# Patient Record
Sex: Female | Born: 1990 | Race: Black or African American | Hispanic: No | Marital: Married | Smoking: Current every day smoker
Health system: Southern US, Community
[De-identification: ages and names within clinical notes are randomized; demographics above are authoritative.]

## PROBLEM LIST (undated history)

## (undated) DIAGNOSIS — K219 Gastro-esophageal reflux disease without esophagitis: Secondary | ICD-10-CM

## (undated) DIAGNOSIS — N186 End stage renal disease: Secondary | ICD-10-CM

## (undated) DIAGNOSIS — Q7649 Other congenital malformations of spine, not associated with scoliosis: Secondary | ICD-10-CM

## (undated) DIAGNOSIS — Z992 Dependence on renal dialysis: Secondary | ICD-10-CM

## (undated) DIAGNOSIS — N390 Urinary tract infection, site not specified: Secondary | ICD-10-CM

## (undated) DIAGNOSIS — I10 Essential (primary) hypertension: Secondary | ICD-10-CM

## (undated) DIAGNOSIS — Z5189 Encounter for other specified aftercare: Secondary | ICD-10-CM

## (undated) DIAGNOSIS — N189 Chronic kidney disease, unspecified: Secondary | ICD-10-CM

## (undated) DIAGNOSIS — IMO0001 Reserved for inherently not codable concepts without codable children: Secondary | ICD-10-CM

## (undated) DIAGNOSIS — D631 Anemia in chronic kidney disease: Secondary | ICD-10-CM

## (undated) DIAGNOSIS — F329 Major depressive disorder, single episode, unspecified: Secondary | ICD-10-CM

## (undated) HISTORY — PX: AV FISTULA PLACEMENT: SHX1204

## (undated) HISTORY — DX: Gastro-esophageal reflux disease without esophagitis: K21.9

---
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
## 2012-01-17 ENCOUNTER — Inpatient Hospital Stay (HOSPITAL_COMMUNITY)
Admission: EM | Admit: 2012-01-17 | Discharge: 2012-01-20 | DRG: 690 | Disposition: A | Discharge status: Home / Self Care | Payer: Medicare Other | Attending: Internal Medicine | Admitting: Internal Medicine

## 2012-01-17 ENCOUNTER — Encounter (HOSPITAL_COMMUNITY): Payer: Self-pay | Admitting: *Deleted

## 2012-01-17 ENCOUNTER — Emergency Department (HOSPITAL_COMMUNITY): Payer: Medicare Other

## 2012-01-17 DIAGNOSIS — D631 Anemia in chronic kidney disease: Secondary | ICD-10-CM | POA: Diagnosis present

## 2012-01-17 DIAGNOSIS — J40 Bronchitis, not specified as acute or chronic: Secondary | ICD-10-CM

## 2012-01-17 DIAGNOSIS — Z887 Allergy status to serum and vaccine status: Secondary | ICD-10-CM

## 2012-01-17 DIAGNOSIS — N186 End stage renal disease: Secondary | ICD-10-CM | POA: Diagnosis present

## 2012-01-17 DIAGNOSIS — Z91040 Latex allergy status: Secondary | ICD-10-CM

## 2012-01-17 DIAGNOSIS — R197 Diarrhea, unspecified: Secondary | ICD-10-CM

## 2012-01-17 DIAGNOSIS — A498 Other bacterial infections of unspecified site: Secondary | ICD-10-CM | POA: Diagnosis present

## 2012-01-17 DIAGNOSIS — I120 Hypertensive chronic kidney disease with stage 5 chronic kidney disease or end stage renal disease: Secondary | ICD-10-CM | POA: Diagnosis present

## 2012-01-17 DIAGNOSIS — N12 Tubulo-interstitial nephritis, not specified as acute or chronic: Secondary | ICD-10-CM

## 2012-01-17 DIAGNOSIS — Z992 Dependence on renal dialysis: Secondary | ICD-10-CM

## 2012-01-17 DIAGNOSIS — Z881 Allergy status to other antibiotic agents status: Secondary | ICD-10-CM

## 2012-01-17 DIAGNOSIS — E871 Hypo-osmolality and hyponatremia: Secondary | ICD-10-CM

## 2012-01-17 DIAGNOSIS — I10 Essential (primary) hypertension: Secondary | ICD-10-CM | POA: Diagnosis present

## 2012-01-17 DIAGNOSIS — E872 Acidosis: Secondary | ICD-10-CM

## 2012-01-17 DIAGNOSIS — D638 Anemia in other chronic diseases classified elsewhere: Secondary | ICD-10-CM | POA: Diagnosis present

## 2012-01-17 DIAGNOSIS — E876 Hypokalemia: Secondary | ICD-10-CM | POA: Diagnosis present

## 2012-01-17 DIAGNOSIS — Q059 Spina bifida, unspecified: Secondary | ICD-10-CM

## 2012-01-17 DIAGNOSIS — K5900 Constipation, unspecified: Secondary | ICD-10-CM | POA: Diagnosis present

## 2012-01-17 DIAGNOSIS — R000 Tachycardia, unspecified: Secondary | ICD-10-CM | POA: Diagnosis present

## 2012-01-17 DIAGNOSIS — N039 Chronic nephritic syndrome with unspecified morphologic changes: Secondary | ICD-10-CM | POA: Diagnosis present

## 2012-01-17 DIAGNOSIS — D649 Anemia, unspecified: Secondary | ICD-10-CM

## 2012-01-17 DIAGNOSIS — Z7401 Bed confinement status: Secondary | ICD-10-CM

## 2012-01-17 DIAGNOSIS — N10 Acute tubulo-interstitial nephritis: Principal | ICD-10-CM | POA: Diagnosis present

## 2012-01-17 DIAGNOSIS — Z91010 Allergy to peanuts: Secondary | ICD-10-CM

## 2012-01-17 MED ORDER — MORPHINE SULFATE 4 MG/ML IJ SOLN
4.0000 mg | Freq: Once | INTRAMUSCULAR | Status: AC
  Administered 2012-01-18: 4 mg via INTRAVENOUS
  Filled 2012-01-17: qty 1

## 2012-01-17 MED ORDER — ONDANSETRON HCL 4 MG/2ML IJ SOLN
4.0000 mg | Freq: Once | INTRAMUSCULAR | Status: AC
  Administered 2012-01-18: 4 mg via INTRAVENOUS
  Filled 2012-01-17: qty 2

## 2012-01-17 NOTE — ED Notes (Signed)
Pt c/o abdominal pain x 1 day. Nausea and vomiting x a couple of hrs. Feels sob as well

## 2012-01-17 NOTE — ED Provider Notes (Signed)
History   This chart was scribed for Ezequiel Essex, MD by Malen Gauze. The patient was seen in room APA09/APA09 and the patient's care was started at 11:35PM.    CSN: [id number]  Arrival date & time 01/17/12  2314   First MD Initiated Contact with Patient 01/17/12 2328      Chief Complaint  Patient presents with  . Nausea  . Emesis  . Abdominal Pain  . Shortness of Breath  . Back Pain    (Consider location/radiation/quality/duration/timing/severity/associated sxs/prior treatment) HPI [name] is a 21 y.o. female who presents to the Emergency Department complaining of intermittent, moderate to severe abdominal pain with an onset tonight. Pt started having nausea, vomit x1 a couple of hours ago. Pt states that pain comes and goes every 10 min. Pt states that she has had similar s/s 2 years ago caused by constipation. Pt also had a fever of 102 today. Nml BM. Pt had dialysis tx today. No HA, fever, neck pain, sore throat, rash, back pain, CP, SOB, diarrhea, dysuria, or extremity pain, edema, weakness, numbness, or tingling. Allergic to cipro, tetanus toxoids, influenza vaccines, and latex. No other pertinent medical symptoms.  Past Medical History  Diagnosis Date  . ESRD (end stage renal disease) on dialysis   . Spina bifida   . UTI (lower urinary tract infection)   . HTN (hypertension) 05/02/2011  . Anemia associated with chronic renal failure   . 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  . Blood transfusion   . Dialysis care     Past Surgical History  Procedure Date  . Av fistula placement     left arm    History reviewed. No pertinent family history.  History  Substance Use Topics  . Smoking status: Never Smoker   . Smokeless tobacco: Not on file  . Alcohol Use: No    OB History    Grav Para Term Preterm Abortions TAB SAB Ect Mult Living                  Review of Systems 10 Systems reviewed and all are negative for acute change  except as noted in the HPI.   Allergies  Other; Peanut-containing drug products; Ciprofloxacin; Influenza vaccines; Tetanus toxoids; and Latex  Home Medications   Current Outpatient Rx  Name Route Sig Dispense Refill  . ACETAMINOPHEN 325 MG PO TABS Oral Take 650 mg by mouth daily as needed. For pain    . EPOETIN ALFA 24401 UNIT/ML IJ SOLN Subcutaneous Inject 1 mL (10,000 Units total) into the skin 3 (three) times a week. 1 mL 2  . IBUPROFEN 200 MG PO TABS Oral Take 400 mg by mouth daily as needed. For pain      BP 126/63  Pulse 108  Temp 99.2 F (37.3 C) (Oral)  Resp 20  SpO2 99%  Physical Exam  Nursing note and vitals reviewed. Constitutional: She is oriented to person, place, and time. She appears well-developed and well-nourished. No distress.  HENT:  Head: Normocephalic and atraumatic.  Right Ear: External ear normal.  Left Ear: External ear normal.  Eyes: EOM are normal.  Neck: Normal range of motion. No tracheal deviation present.  Cardiovascular: Normal rate, regular rhythm and normal heart sounds.   No murmur heard. Pulmonary/Chest: Effort normal and breath sounds normal. No respiratory distress.  Abdominal: Soft. There is tenderness (Epigastric tenderness). There is no rebound and no guarding.  Musculoskeletal: Normal range of motion. She  exhibits tenderness (Bilateral CVA tenderness). She exhibits no edema.       LUE fistula with bruit and thrill Chronic LE contractures  Neurological: She is alert and oriented to person, place, and time.  Skin: Skin is warm and dry.  Psychiatric: She has a normal mood and affect. Her behavior is normal.    ED Course  Procedures (including critical care time)  DIAGNOSTIC STUDIES: Oxygen Saturation is 97% on room air, normal by my interpretation.    COORDINATION OF CARE:  11:39PM - EDMD will order Zofran, morphine, abd/CXR, UA, and blood w/u for the pt.   Labs Reviewed  URINALYSIS, ROUTINE W REFLEX MICROSCOPIC -  Abnormal; Notable for the following:    APPearance CLOUDY (*)     pH 8.5 (*)     Hgb urine dipstick SMALL (*)     Protein, ur 100 (*)     Leukocytes, UA MODERATE (*)     All other components within normal limits  CBC - Abnormal; Notable for the following:    Hemoglobin 11.9 (*)     All other components within normal limits  COMPREHENSIVE METABOLIC PANEL - Abnormal; Notable for the following:    Potassium 3.1 (*)     Creatinine, Ser 2.79 (*)     Total Bilirubin 0.1 (*)     GFR calc non Af Amer 24 (*)     GFR calc Af Amer 27 (*)     All other components within normal limits  LIPASE, BLOOD - Abnormal; Notable for the following:    Lipase 64 (*)     All other components within normal limits  URINE MICROSCOPIC-ADD ON - Abnormal; Notable for the following:    Bacteria, UA MANY (*)     All other components within normal limits  DIFFERENTIAL  POCT PREGNANCY, URINE  URINE CULTURE   Ct Abdomen Pelvis W Contrast  01/18/2012  *RADIOLOGY REPORT*  Clinical Data: Nausea, emesis, abdominal pain and shortness of breath.  Back pain.  History of spina bifida and chronic renal failure; on dialysis.  CT ABDOMEN AND PELVIS WITH CONTRAST  Technique:  Multidetector CT imaging of the abdomen and pelvis was performed following the standard protocol during bolus administration of intravenous contrast.  Contrast: 34mL OMNIPAQUE IOHEXOL 300 MG/ML  SOLN  Comparison: Renal ultrasound performed 04/01/2005, and abdominal radiograph performed earlier today at 02:01 a.m.  Findings: The visualized lung bases are clear.  Scattered small hypodensities within the liver, measuring up to 1.0 cm in size, are nonspecific but may reflect small cysts.  The spleen is unremarkable in appearance.  The pancreas is grossly unremarkable.  The gallbladder is within normal limits.  The adrenal glands are not well assessed; no suspicious adrenal gland masses are seen.  Borderline prominent periaortic nodes are seen at the level of the  diaphragm; these may reflect the patient's baseline.  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  Associated marked dilatation of the ureters is noted; findings are compatible with severe chronic hydronephrosis.  No free fluid is identified.  The small bowel is largely filled with contrast and is grossly unremarkable in appearance, though difficult to fully characterize due to surrounding structures.  The stomach is within normal limits.  No acute vascular abnormalities are seen.  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  There is marked dilatation of the rectum to 7.1 cm in diameter, filled with very dense stool, and demonstrating surrounding  wall thickening, concerning for impaction.  Stool is noted partially filling the remainder of the colon; minimal contrast is noted extending into the ascending colon.  The appendix is difficult to fully characterize; there is no evidence for appendicitis.  The bladder is markedly thick-walled, with multiple scattered diverticula and open vesicoureteral junctions, reflecting chronic reflux up both markedly enlarged ureters.  The uterus is not well assessed.  No suspicious adnexal masses are characterized; the ovaries appear grossly unremarkable.  No inguinal lymphadenopathy is seen.  No acute osseous abnormalities are identified.  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  IMPRESSION:  1.  Marked dilatation of the rectum to 7.1 cm in diameter, filled with very dense stool, and demonstrating surrounding wall thickening, concerning for impaction.  Stool partially fills the remainder of the colon. 2.  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  Associated marked dilatation of both ureters, with open reflux into markedly enlarged ureters bilaterally.  Significant wall  thickening along the bladder reflects chronic inflammation, with scattered bladder diverticula. Findings compatible with chronic severe hydronephrosis. 3.  Scattered small hypodensities within the liver are nonspecific but may reflect small cysts. 4.  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  Original Report Authenticated By: Santa Lighter, M.D.   Dg Abd Acute W/chest  01/18/2012  *RADIOLOGY REPORT*  Clinical Data: Abdominal pain, nausea and vomiting.  Shortness of breath.  Patient on dialysis.  ACUTE ABDOMEN SERIES (ABDOMEN 2 VIEW & CHEST 1 VIEW)  Comparison: Chest radiograph performed 05/02/2011, and abdominal radiograph performed 01/10/2011  Findings: The lungs are well-aerated and clear.  There is no evidence of focal opacification, pleural effusion or pneumothorax. The cardiomediastinal silhouette is within normal limits.  The visualized bowel gas pattern is unremarkable.  Scattered stool and air are seen within the colon; there is no evidence of small bowel dilatation to suggest obstruction.  No free intra-abdominal air is identified on the provided upright view.  Chronic osseous abnormalities are stable in appearance.  IMPRESSION:  1.  Unremarkable bowel gas pattern; no free intra-abdominal air seen. 2.  No acute cardiopulmonary process identified.  Original Report Authenticated By: Santa Lighter, M.D.     No diagnosis found.    MDM  Abdominal pain with nausea and vomiting for the past day.  Fever 102 at home.  Hx dialysis, self catheterization No bowel obstruction on AAS. Infected UA. Mild elevation of lipase.  Dialysis patient with fever and pyelonephritis. Questionable rectal impaction on CT.  Patient refuses rectal exam for further evaluation.  Did have BM in ED. Persistent tachycardia with fever and urinary symptoms. Will admit for IV antibiotics and monitoring.  I  personally performed the services described in this documentation, which was scribed in my presence.  The recorded information has been reviewed and considered.         Ezequiel Essex, MD 01/18/12 [phone]

## 2012-01-18 ENCOUNTER — Encounter (HOSPITAL_COMMUNITY): Payer: Self-pay | Admitting: *Deleted

## 2012-01-18 ENCOUNTER — Emergency Department (HOSPITAL_COMMUNITY): Payer: Medicare Other

## 2012-01-18 DIAGNOSIS — N186 End stage renal disease: Secondary | ICD-10-CM

## 2012-01-18 DIAGNOSIS — N039 Chronic nephritic syndrome with unspecified morphologic changes: Secondary | ICD-10-CM

## 2012-01-18 DIAGNOSIS — Q059 Spina bifida, unspecified: Secondary | ICD-10-CM

## 2012-01-18 DIAGNOSIS — I10 Essential (primary) hypertension: Secondary | ICD-10-CM

## 2012-01-18 DIAGNOSIS — R5381 Other malaise: Secondary | ICD-10-CM

## 2012-01-18 DIAGNOSIS — D631 Anemia in chronic kidney disease: Secondary | ICD-10-CM

## 2012-01-18 LAB — URINALYSIS, ROUTINE W REFLEX MICROSCOPIC
Bilirubin Urine: NEGATIVE
Glucose, UA: NEGATIVE mg/dL
Ketones, ur: NEGATIVE mg/dL
pH: 8.5 — ABNORMAL HIGH (ref 5.0–8.0)

## 2012-01-18 LAB — COMPREHENSIVE METABOLIC PANEL
Albumin: 3.6 g/dL (ref 3.5–5.2)
BUN: 20 mg/dL (ref 6–23)
Chloride: 99 mEq/L (ref 96–112)
Creatinine, Ser: 2.79 mg/dL — ABNORMAL HIGH (ref 0.50–1.10)
GFR calc Af Amer: 27 mL/min — ABNORMAL LOW (ref >90)
Glucose, Bld: 94 mg/dL (ref 70–99)
Total Bilirubin: 0.1 mg/dL — ABNORMAL LOW (ref 0.3–1.2)

## 2012-01-18 LAB — DIFFERENTIAL
Basophils Relative: 0 % (ref 0–1)
Eosinophils Absolute: 0.2 10*3/uL (ref 0.0–0.7)
Monocytes Absolute: 0.5 10*3/uL (ref 0.1–1.0)
Monocytes Relative: 7 % (ref 3–12)
Neutro Abs: 4.8 10*3/uL (ref 1.7–7.7)

## 2012-01-18 LAB — POCT PREGNANCY, URINE: Preg Test, Ur: NEGATIVE

## 2012-01-18 LAB — CBC
HCT: 36.2 % (ref 36.0–46.0)
Hemoglobin: 11.9 g/dL — ABNORMAL LOW (ref 12.0–15.0)
MCH: 30.3 pg (ref 26.0–34.0)
MCHC: 32.9 g/dL (ref 30.0–36.0)

## 2012-01-18 LAB — LIPASE, BLOOD: Lipase: 64 U/L — ABNORMAL HIGH (ref 11–59)

## 2012-01-18 LAB — URINE MICROSCOPIC-ADD ON

## 2012-01-18 MED ORDER — SODIUM CHLORIDE 0.9 % IV SOLN
250.0000 mL | INTRAVENOUS | Status: DC | PRN
  Administered 2012-01-18: 250 mL via INTRAVENOUS

## 2012-01-18 MED ORDER — BENZOCAINE 10 % MT GEL
Freq: Four times a day (QID) | OROMUCOSAL | Status: DC | PRN
  Filled 2012-01-18: qty 9.4

## 2012-01-18 MED ORDER — HYDROMORPHONE HCL PF 1 MG/ML IJ SOLN
0.5000 mg | INTRAMUSCULAR | Status: DC | PRN
  Administered 2012-01-18 (×2): 0.5 mg via INTRAVENOUS
  Filled 2012-01-18 (×2): qty 1

## 2012-01-18 MED ORDER — IOHEXOL 300 MG/ML  SOLN: 20.0000 mL | Freq: Once | INTRAMUSCULAR | Status: DC | PRN

## 2012-01-18 MED ORDER — HEPARIN SODIUM (PORCINE) 5000 UNIT/ML IJ SOLN
5000.0000 [IU] | Freq: Three times a day (TID) | INTRAMUSCULAR | Status: DC
  Administered 2012-01-18: 5000 [IU] via SUBCUTANEOUS
  Filled 2012-01-18 (×2): qty 1

## 2012-01-18 MED ORDER — SODIUM CHLORIDE 0.9 % IJ SOLN: 3.0000 mL | INTRAMUSCULAR | Status: DC | PRN

## 2012-01-18 MED ORDER — SODIUM CHLORIDE 0.9 % IV SOLN: INTRAVENOUS | Status: DC

## 2012-01-18 MED ORDER — DEXTROSE 5 % IV SOLN
1.0000 g | INTRAVENOUS | Status: DC
  Administered 2012-01-19: 1 g via INTRAVENOUS
  Filled 2012-01-18: qty 10

## 2012-01-18 MED ORDER — SODIUM CHLORIDE 0.9 % IJ SOLN
3.0000 mL | Freq: Two times a day (BID) | INTRAMUSCULAR | Status: DC
  Administered 2012-01-20: 3 mL via INTRAVENOUS
  Filled 2012-01-18: qty 3

## 2012-01-18 MED ORDER — ONDANSETRON HCL 4 MG/2ML IJ SOLN
4.0000 mg | Freq: Four times a day (QID) | INTRAMUSCULAR | Status: DC | PRN
  Administered 2012-01-18 (×2): 4 mg via INTRAVENOUS
  Filled 2012-01-18 (×2): qty 2

## 2012-01-18 MED ORDER — POLYETHYLENE GLYCOL 3350 17 G PO PACK
17.0000 g | PACK | Freq: Every day | ORAL | Status: DC
  Administered 2012-01-19: 17 g via ORAL
  Filled 2012-01-18: qty 1

## 2012-01-18 MED ORDER — DOCUSATE SODIUM 100 MG PO CAPS
100.0000 mg | ORAL_CAPSULE | Freq: Two times a day (BID) | ORAL | Status: DC
  Administered 2012-01-18: 100 mg via ORAL
  Filled 2012-01-18: qty 1

## 2012-01-18 MED ORDER — BISACODYL 10 MG RE SUPP: 10.0000 mg | Freq: Every day | RECTAL | Status: DC | PRN

## 2012-01-18 MED ORDER — POLYETHYLENE GLYCOL 3350 17 G PO PACK: 17.0000 g | PACK | Freq: Every day | ORAL | Status: DC

## 2012-01-18 MED ORDER — ONDANSETRON HCL 4 MG PO TABS: 4.0000 mg | ORAL_TABLET | Freq: Four times a day (QID) | ORAL | Status: DC | PRN

## 2012-01-18 MED ORDER — IBUPROFEN 400 MG PO TABS
400.0000 mg | ORAL_TABLET | Freq: Once | ORAL | Status: AC
Start: 2012-01-18 — End: 2012-01-18
  Administered 2012-01-18: 400 mg via ORAL
  Filled 2012-01-18: qty 1

## 2012-01-18 MED ORDER — SODIUM CHLORIDE 0.9 % IV BOLUS (SEPSIS)
500.0000 mL | Freq: Once | INTRAVENOUS | Status: AC
  Administered 2012-01-18: 500 mL via INTRAVENOUS

## 2012-01-18 MED ORDER — IOHEXOL 300 MG/ML  SOLN
35.0000 mL | Freq: Once | INTRAMUSCULAR | Status: AC | PRN
  Administered 2012-01-18: 35 mL via INTRAVENOUS

## 2012-01-18 MED ORDER — ONDANSETRON HCL 4 MG/2ML IJ SOLN
4.0000 mg | Freq: Three times a day (TID) | INTRAMUSCULAR | Status: DC | PRN
  Administered 2012-01-18: 4 mg via INTRAVENOUS
  Filled 2012-01-18: qty 2

## 2012-01-18 MED ORDER — DEXTROSE 5 % IV SOLN
1.0000 g | Freq: Once | INTRAVENOUS | Status: AC
  Administered 2012-01-18: 1 g via INTRAVENOUS
  Filled 2012-01-18: qty 10

## 2012-01-18 MED ORDER — HYDROMORPHONE HCL PF 1 MG/ML IJ SOLN
0.5000 mg | INTRAMUSCULAR | Status: DC | PRN
  Administered 2012-01-18: 0.5 mg via INTRAVENOUS
  Filled 2012-01-18: qty 1

## 2012-01-18 MED ORDER — POTASSIUM CHLORIDE CRYS ER 20 MEQ PO TBCR
40.0000 meq | EXTENDED_RELEASE_TABLET | Freq: Once | ORAL | Status: AC
  Administered 2012-01-18: 40 meq via ORAL
  Filled 2012-01-18: qty 2

## 2012-01-18 MED ORDER — FLEET ENEMA 7-19 GM/118ML RE ENEM
1.0000 | ENEMA | Freq: Once | RECTAL | Status: AC
  Administered 2012-01-18: 1 via RECTAL

## 2012-01-18 MED ORDER — SODIUM CHLORIDE 0.9 % IJ SOLN
3.0000 mL | Freq: Two times a day (BID) | INTRAMUSCULAR | Status: DC
  Administered 2012-01-20 (×2): 3 mL via INTRAVENOUS
  Filled 2012-01-18: qty 6

## 2012-01-18 NOTE — Progress Notes (Signed)
Pt has c/o gums hurting her this morning.  Paged Dr. Conley Canal to notify.

## 2012-01-18 NOTE — ED Notes (Addendum)
Fleets given to pt, pt has soft stool at rectum entrance; Pt has no control of muscle and was unable to hold enema in; app 1/4 to 1/2 of enema retained; Small amts of stool expelled; Pt cleaned and padded; small amts liquid stool still coming out at end of procedure.

## 2012-01-18 NOTE — Progress Notes (Signed)
Pt c/o nausea and vomited a large amount of emesis.

## 2012-01-18 NOTE — Consult Note (Signed)
Reason for Consult: End-stage renal disease Referring Physician: Dr. Rockie Neighbours is an 21 y.o. female.  HPI: She is a patient who has history of her hypertension, spina bifida and end-stage renal disease presently came with complaints of her difficulty in breathing. According to the patient she had her dialysis yesterday and when she went home she started having difficulty breathing. Presently she says she is feeling better and denies any orthopnea or paroxysmal nocturnal dyspnea. She denies also any nausea or vomiting her today.  Past Medical History  Diagnosis Date  . ESRD (end stage renal disease) on dialysis   . Spina bifida   . UTI (lower urinary tract infection)   . HTN (hypertension) 05/02/2011  . Anemia associated with chronic renal failure   . 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  . Blood transfusion   . Dialysis care     Past Surgical History  Procedure Date  . Av fistula placement     left arm    Family History  Problem Relation Age of Onset  . Kidney cancer Other     Social History:  reports that she has never smoked. She does not have any smokeless tobacco history on file. She reports that she does not drink alcohol or use illicit drugs.  Allergies:  Allergies  Allergen Reactions  . Other Anaphylaxis    Revaclear dialzer  . Peanut-Containing Drug Products Shortness Of Breath and Swelling    Tongue swelling  . Ciprofloxacin Nausea And Vomiting and Other (See Comments)    HIGH FEVER  . Influenza Vaccines Nausea And Vomiting  . Tetanus Toxoids Nausea And Vomiting and Other (See Comments)    HIGH FEVER  . Latex Itching and Rash    Medications: I have reviewed the patient's current medications.  Results for orders placed during the hospital encounter of 01/17/12 (from the past 48 hour(s))  CBC     Status: Abnormal   Collection Time   01/18/12 12:03 AM      Component Value Range Comment   WBC 7.8  4.0 - 10.5 K/uL    RBC 3.93   3.87 - 5.11 MIL/uL    Hemoglobin 11.9 (*) 12.0 - 15.0 g/dL    HCT 36.2  36.0 - 46.0 %    MCV 92.1  78.0 - 100.0 fL    MCH 30.3  26.0 - 34.0 pg    MCHC 32.9  30.0 - 36.0 g/dL    RDW 12.3  11.5 - 15.5 %    Platelets 217  150 - 400 K/uL   DIFFERENTIAL     Status: Normal   Collection Time   01/18/12 12:03 AM      Component Value Range Comment   Neutrophils Relative 62  43 - 77 %    Neutro Abs 4.8  1.7 - 7.7 K/uL    Lymphocytes Relative 28  12 - 46 %    Lymphs Abs 2.2  0.7 - 4.0 K/uL    Monocytes Relative 7  3 - 12 %    Monocytes Absolute 0.5  0.1 - 1.0 K/uL    Eosinophils Relative 3  0 - 5 %    Eosinophils Absolute 0.2  0.0 - 0.7 K/uL    Basophils Relative 0  0 - 1 %    Basophils Absolute 0.0  0.0 - 0.1 K/uL   COMPREHENSIVE METABOLIC PANEL     Status: Abnormal   Collection Time   01/18/12 12:03  AM      Component Value Range Comment   Sodium 136  135 - 145 mEq/L    Potassium 3.1 (*) 3.5 - 5.1 mEq/L    Chloride 99  96 - 112 mEq/L    CO2 27  19 - 32 mEq/L    Glucose, Bld 94  70 - 99 mg/dL    BUN 20  6 - 23 mg/dL    Creatinine, Ser 2.79 (*) 0.50 - 1.10 mg/dL    Calcium 9.4  8.4 - 10.5 mg/dL    Total Protein 7.9  6.0 - 8.3 g/dL    Albumin 3.6  3.5 - 5.2 g/dL    AST 20  0 - 37 U/L    ALT 7  0 - 35 U/L    Alkaline Phosphatase 111  39 - 117 U/L    Total Bilirubin 0.1 (*) 0.3 - 1.2 mg/dL    GFR calc non Af Amer 24 (*) >90 mL/min    GFR calc Af Amer 27 (*) >90 mL/min   LIPASE, BLOOD     Status: Abnormal   Collection Time   01/18/12 12:03 AM      Component Value Range Comment   Lipase 64 (*) 11 - 59 U/L   URINALYSIS, ROUTINE W REFLEX MICROSCOPIC     Status: Abnormal   Collection Time   01/18/12 12:25 AM      Component Value Range Comment   Color, Urine YELLOW  YELLOW    APPearance CLOUDY (*) CLEAR    Specific Gravity, Urine 1.015  1.005 - 1.030    pH 8.5 (*) 5.0 - 8.0    Glucose, UA NEGATIVE  NEGATIVE mg/dL    Hgb urine dipstick SMALL (*) NEGATIVE    Bilirubin Urine NEGATIVE   NEGATIVE    Ketones, ur NEGATIVE  NEGATIVE mg/dL    Protein, ur 100 (*) NEGATIVE mg/dL    Urobilinogen, UA 0.2  0.0 - 1.0 mg/dL    Nitrite NEGATIVE  NEGATIVE    Leukocytes, UA MODERATE (*) NEGATIVE   URINE MICROSCOPIC-ADD ON     Status: Abnormal   Collection Time   01/18/12 12:25 AM      Component Value Range Comment   Squamous Epithelial / LPF RARE  RARE    WBC, UA TOO NUMEROUS TO COUNT  <3 WBC/hpf    RBC / HPF 3-6  <3 RBC/hpf    Bacteria, UA MANY (*) RARE   POCT PREGNANCY, URINE     Status: Normal   Collection Time   01/18/12 12:27 AM      Component Value Range Comment   Preg Test, Ur NEGATIVE  NEGATIVE     Ct Abdomen Pelvis W Contrast  01/18/2012  *RADIOLOGY REPORT*  Clinical Data: Nausea, emesis, abdominal pain and shortness of breath.  Back pain.  History of spina bifida and chronic renal failure; on dialysis.  CT ABDOMEN AND PELVIS WITH CONTRAST  Technique:  Multidetector CT imaging of the abdomen and pelvis was performed following the standard protocol during bolus administration of intravenous contrast.  Contrast: 32mL OMNIPAQUE IOHEXOL 300 MG/ML  SOLN  Comparison: Renal ultrasound performed 04/01/2005, and abdominal radiograph performed earlier today at 02:01 a.m.  Findings: The visualized lung bases are clear.  Scattered small hypodensities within the liver, measuring up to 1.0 cm in size, are nonspecific but may reflect small cysts.  The spleen is unremarkable in appearance.  The pancreas is grossly unremarkable.  The gallbladder is within normal limits.  The adrenal  glands are not well assessed; no suspicious adrenal gland masses are seen.  Borderline prominent periaortic nodes are seen at the level of the diaphragm; these may reflect the patient's baseline.  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  Associated marked dilatation of the ureters is noted; findings are compatible with severe chronic  hydronephrosis.  No free fluid is identified.  The small bowel is largely filled with contrast and is grossly unremarkable in appearance, though difficult to fully characterize due to surrounding structures.  The stomach is within normal limits.  No acute vascular abnormalities are seen.  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  There is marked dilatation of the rectum to 7.1 cm in diameter, filled with very dense stool, and demonstrating surrounding wall thickening, concerning for impaction.  Stool is noted partially filling the remainder of the colon; minimal contrast is noted extending into the ascending colon.  The appendix is difficult to fully characterize; there is no evidence for appendicitis.  The bladder is markedly thick-walled, with multiple scattered diverticula and open vesicoureteral junctions, reflecting chronic reflux up both markedly enlarged ureters.  The uterus is not well assessed.  No suspicious adnexal masses are characterized; the ovaries appear grossly unremarkable.  No inguinal lymphadenopathy is seen.  No acute osseous abnormalities are identified.  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  IMPRESSION:  1.  Marked dilatation of the rectum to 7.1 cm in diameter, filled with very dense stool, and demonstrating surrounding wall thickening, concerning for impaction.  Stool partially fills the remainder of the colon. 2.  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  Associated marked dilatation of both ureters, with open reflux into markedly enlarged ureters bilaterally.  Significant wall thickening along the bladder reflects chronic inflammation, with scattered bladder diverticula. Findings compatible with chronic severe hydronephrosis. 3.  Scattered small hypodensities within the liver are nonspecific but may reflect small cysts. 4.  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  Original Report Authenticated By: Santa Lighter, M.D.   Dg Abd Acute W/chest  01/18/2012  *RADIOLOGY REPORT*  Clinical Data: Abdominal pain, nausea and vomiting.  Shortness of breath.  Patient on dialysis.  ACUTE ABDOMEN SERIES (ABDOMEN 2 VIEW & CHEST 1 VIEW)  Comparison: Chest radiograph performed 05/02/2011, and abdominal radiograph performed 01/10/2011  Findings: The lungs are well-aerated and clear.  There is no evidence of focal opacification, pleural effusion or pneumothorax. The cardiomediastinal silhouette is within normal limits.  The visualized bowel gas pattern is unremarkable.  Scattered stool and air are seen within the colon; there is no evidence of small bowel dilatation to suggest obstruction.  No free intra-abdominal air is identified on the provided upright view.  Chronic osseous abnormalities are stable in appearance.  IMPRESSION:  1.  Unremarkable bowel gas pattern; no free intra-abdominal air seen. 2.  No acute cardiopulmonary process identified.  Original Report Authenticated By: Santa Lighter, M.D.    Review of Systems  Constitutional: Negative for fever.  HENT: Negative for congestion.   Respiratory: Positive for shortness of breath.   Gastrointestinal: Positive for nausea. Negative for vomiting and abdominal pain.  Neurological: Negative for weakness.   Blood pressure 112/76, pulse 79, temperature 98 F (36.7 C), temperature source Oral, resp. rate 20, weight 30.1 kg (66  lb 5.7 oz), SpO2 100.00%. Physical Exam  Eyes: No scleral icterus.  Neck: No JVD present.  Cardiovascular: Normal rate and regular rhythm.   No murmur heard. Respiratory: No respiratory distress. She has no wheezes.  GI: She exhibits no distension. There is no tenderness.  Musculoskeletal: She exhibits no edema.  Neurological: She is alert.     Assessment/Plan: Problem #1 end-stage renal disease she status post hemodialysis yesterday her BUN and creatinine stable condition doesn't have any uremic sign and symptoms. Problem #2 hypokalemia Problem #3 history of hypertension Problem #4 history of anemia most likely secondary to chronic renal failure Problem #5 history of spina bifida Problem #6 history of difficulty breathing presently patient is a symptomatic. Problem #7 history of tachycardia. Plan: We'll give her KCl 40 mEq by mouth one dose We'll follow her basic metabolic panel and phosphorus in the morning We'll make a decision about her dialysis tomorrow. If patient is stable her regular dialysis schedule will be Tuesday Thursday and Saturday. Hence her next regular scheduled on Tuesday.  Brantly Kalman S 01/18/2012, 8:33 AM

## 2012-01-18 NOTE — H&P (Signed)
Chief Complaint:  Fever  HPI: 21 year old female with a history of spina bifida and has frequent bladder infections as she has to self cath herself every 4 hours at home comes in with less than 24 hours of fever and and right-sided flank pain. She denies any nausea vomiting. She denies any diarrhea. She is on dialysis Tuesdays Thursdays and Saturdays. She is bed bound due to her spina bifida.  Review of Systems:  Otherwise negative  Past Medical History: Past Medical History  Diagnosis Date  . ESRD (end stage renal disease) on dialysis   . Spina bifida   . UTI (lower urinary tract infection)   . HTN (hypertension) 05/02/2011  . Anemia associated with chronic renal failure   . 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  . Blood transfusion   . Dialysis care    Past Surgical History  Procedure Date  . Av fistula placement     left arm    Medications: Prior to Admission medications   Medication Sig Start Date End Date Taking? Authorizing Provider  acetaminophen (TYLENOL) 325 MG tablet Take 650 mg by mouth daily as needed. For pain   Yes Historical Provider, MD  epoetin alfa (EPOGEN,PROCRIT) 60454 UNIT/ML injection Inject 1 mL (10,000 Units total) into the skin 3 (three) times a week. 05/08/11  Yes Ardyth Gal, MD  ibuprofen (ADVIL,MOTRIN) 200 MG tablet Take 400 mg by mouth daily as needed. For pain   Yes Historical Provider, MD    Allergies:   Allergies  Allergen Reactions  . Other Anaphylaxis    Revaclear dialzer  . Peanut-Containing Drug Products Shortness Of Breath and Swelling    Tongue swelling  . Ciprofloxacin Nausea And Vomiting and Other (See Comments)    HIGH FEVER  . Influenza Vaccines Nausea And Vomiting  . Tetanus Toxoids Nausea And Vomiting and Other (See Comments)    HIGH FEVER  . Latex Itching and Rash    Social History:  reports that she has never smoked. She does not have any smokeless tobacco history on file. She reports that she  does not drink alcohol or use illicit drugs.  Family History: History reviewed. No pertinent family history.  Physical Exam: Filed Vitals:   01/17/12 2316 01/18/12 0258 01/18/12 0453  BP: 123/64 126/63 131/72  Pulse: 111 108 97  Temp: 99.1 F (37.3 C) 99.2 F (37.3 C) 99.2 F (37.3 C)  TempSrc: Oral Oral   Resp: 24 20   SpO2: 97% 99% 99%   General appearance: alert, cooperative and no distress Lungs: clear to auscultation bilaterally Heart: regular rate and rhythm, S1, S2 normal, no murmur, click, rub or gallop Abdomen: soft, non-tender; bowel sounds normal; no masses,  no organomegaly Extremities: contractures with major deformities of ble Pulses: 2+ and symmetric Skin: Skin color, texture, turgor normal. No rashes or lesions Neurologic: Grossly normal cranial nerves.  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  Back pain.  History of spina bifida and chronic renal failure; on dialysis.  CT ABDOMEN AND PELVIS WITH CONTRAST  Technique:  Multidetector CT imaging of the abdomen and pelvis was performed following the standard protocol during bolus administration of intravenous contrast.  Contrast: 44mL OMNIPAQUE IOHEXOL 300 MG/ML  SOLN  Comparison: Renal ultrasound performed 04/01/2005, and abdominal radiograph performed earlier today at 02:01 a.m.  Findings: The visualized lung bases are clear.   Scattered small hypodensities within the liver, measuring up to 1.0 cm in size, are nonspecific but may reflect small cysts.  The spleen is unremarkable in appearance.  The pancreas is grossly unremarkable.  The gallbladder is within normal limits.  The adrenal glands are not well assessed; no suspicious adrenal gland masses are seen.  Borderline prominent periaortic nodes are seen at the level of the diaphragm; these may reflect the patient's baseline.  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  Associated marked dilatation of the ureters is noted; findings are compatible with severe chronic hydronephrosis.  No free fluid is identified.  The small bowel is largely filled with contrast and is grossly unremarkable in appearance, though difficult to fully characterize due to surrounding structures.  The stomach is within normal limits.  No acute vascular abnormalities are seen.  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  There is marked dilatation of the rectum to 7.1 cm in diameter, filled with very dense stool, and demonstrating surrounding wall thickening, concerning for impaction.  Stool is noted partially filling the remainder of the colon; minimal contrast is noted extending into the ascending colon.  The appendix is difficult to fully characterize; there is no evidence for appendicitis.  The bladder is markedly thick-walled, with multiple scattered diverticula and open vesicoureteral junctions, reflecting chronic reflux up both markedly enlarged ureters.  The uterus is not well assessed.  No suspicious adnexal masses are characterized; the ovaries appear grossly unremarkable.  No inguinal lymphadenopathy is seen.  No acute osseous abnormalities are identified.  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  IMPRESSION:  1.  Marked dilatation of the rectum to 7.1 cm in  diameter, filled with very dense stool, and demonstrating surrounding wall thickening, concerning for impaction.  Stool partially fills the remainder of the colon. 2.  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  Associated marked dilatation of both ureters, with open reflux into markedly enlarged ureters bilaterally.  Significant wall thickening along the bladder reflects chronic inflammation, with scattered bladder diverticula. Findings compatible with chronic severe hydronephrosis. 3.  Scattered small hypodensities within the liver are nonspecific but may reflect small cysts. 4.  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  Original Report Authenticated By: Santa Lighter, M.D.   Dg Abd Acute W/chest  01/18/2012  *RADIOLOGY REPORT*  Clinical Data: Abdominal pain, nausea and vomiting.  Shortness of breath.  Patient on dialysis.  ACUTE ABDOMEN SERIES (ABDOMEN 2 VIEW & CHEST 1 VIEW)  Comparison: Chest radiograph performed 05/02/2011, and abdominal radiograph performed 01/10/2011  Findings: The lungs are well-aerated and clear.  There is no evidence  of focal opacification, pleural effusion or pneumothorax. The cardiomediastinal silhouette is within normal limits.  The visualized bowel gas pattern is unremarkable.  Scattered stool and air are seen within the colon; there is no evidence of small bowel dilatation to suggest obstruction.  No free intra-abdominal air is identified on the provided upright view.  Chronic osseous abnormalities are stable in appearance.  IMPRESSION:  1.  Unremarkable bowel gas pattern; no free intra-abdominal air seen. 2.  No acute cardiopulmonary process identified.  Original Report Authenticated By: Santa Lighter, M.D.    Assessment/Plan Present on Admission:  21 year old with acute pyelonephritis,  end-stage renal disease dialysis dependent, spina bifida  .ESRD (end stage renal disease) on dialysis .Pyelonephritis, acute .Anemia .Tachycardia .HTN (hypertension)  Urine culture has been sent place patient on Rocephin. She's received some IV fluids in the emergency department will hold off on this any further as she is a dialysis patient. Obtain nephrology consultation.  Coron Rossano A [id number] 01/18/2012, 5:28 AM

## 2012-01-18 NOTE — Progress Notes (Signed)
Patient admitted after midnight. Chart reviewed. Patient examined. See orders.

## 2012-01-18 NOTE — ED Notes (Signed)
Patient vomited approximately 600 cc, Dr. Wyvonnia Dusky notified. Patient denied wanting anything for nausea, requesting crackers.

## 2012-01-18 NOTE — Progress Notes (Signed)
Per MD order, pt self-catheterized self in & out catheter.

## 2012-01-19 DIAGNOSIS — N039 Chronic nephritic syndrome with unspecified morphologic changes: Secondary | ICD-10-CM

## 2012-01-19 DIAGNOSIS — N186 End stage renal disease: Secondary | ICD-10-CM

## 2012-01-19 DIAGNOSIS — I10 Essential (primary) hypertension: Secondary | ICD-10-CM

## 2012-01-19 DIAGNOSIS — R5381 Other malaise: Secondary | ICD-10-CM

## 2012-01-19 DIAGNOSIS — D631 Anemia in chronic kidney disease: Secondary | ICD-10-CM

## 2012-01-19 LAB — BASIC METABOLIC PANEL
CO2: 20 mEq/L (ref 19–32)
Chloride: 103 mEq/L (ref 96–112)
Creatinine, Ser: 4.23 mg/dL — ABNORMAL HIGH (ref 0.50–1.10)

## 2012-01-19 LAB — CBC
HCT: 34 % — ABNORMAL LOW (ref 36.0–46.0)
MCV: 93.4 fL (ref 78.0–100.0)
RDW: 12.7 % (ref 11.5–15.5)
WBC: 4.4 10*3/uL (ref 4.0–10.5)

## 2012-01-19 MED ORDER — POTASSIUM CHLORIDE CRYS ER 20 MEQ PO TBCR
30.0000 meq | EXTENDED_RELEASE_TABLET | Freq: Two times a day (BID) | ORAL | Status: DC
  Administered 2012-01-19 – 2012-01-20 (×3): 30 meq via ORAL
  Filled 2012-01-19 (×3): qty 1

## 2012-01-19 MED ORDER — CEFUROXIME AXETIL 250 MG PO TABS
250.0000 mg | ORAL_TABLET | Freq: Two times a day (BID) | ORAL | Status: DC
  Administered 2012-01-19 – 2012-01-20 (×3): 250 mg via ORAL
  Filled 2012-01-19 (×3): qty 1

## 2012-01-19 MED ORDER — HEPARIN SODIUM (PORCINE) 1000 UNIT/ML DIALYSIS
20.0000 [IU]/kg | INTRAMUSCULAR | Status: DC | PRN
  Filled 2012-01-19: qty 1

## 2012-01-19 MED ORDER — HEPARIN SODIUM (PORCINE) 1000 UNIT/ML DIALYSIS
500.0000 [IU] | INTRAMUSCULAR | Status: DC | PRN
  Filled 2012-01-19: qty 1

## 2012-01-19 NOTE — Progress Notes (Signed)
Subjective: Feels fine.  No N/V, No pain. Tolerating diet  Objective: Vital signs in last 24 hours: Filed Vitals:   01/18/12 0609 01/18/12 1500 01/18/12 2106 01/19/12 0442  BP: 112/76 106/61 106/68 108/70  Pulse: 79 96 90 90  Temp: 98 F (36.7 C) 97.3 F (36.3 C) 99.3 F (37.4 C) 98.4 F (36.9 C)  TempSrc: Oral  Oral Oral  Resp: 20 20 20 20   Height: 3' (0.914 m)     Weight: 30.1 kg (66 lb 5.7 oz)     SpO2: 100% 98% 100% 100%   Weight change:   Intake/Output Summary (Last 24 hours) at 01/19/12 1130 Last data filed at 01/18/12 1824  Gross per 24 hour  Intake 360.67 ml  Output      0 ml  Net 360.67 ml   Gen: Alert, oriented. Washing up Abd S, NT, ND Lungs CTA without WRRR CV RRR  Lab Results: Basic Metabolic Panel:  Lab [id number] 0530 01/18/12 0003  NA 140 136  K 3.1* 3.1*  CL 103 99  CO2 20 27  GLUCOSE 103* 94  BUN 29* 20  CREATININE 4.23* 2.79*  CALCIUM 9.1 9.4  MG -- --  PHOS -- --   Liver Function Tests:  Lab 01/18/12 0003  AST 20  ALT 7  ALKPHOS 111  BILITOT 0.1*  PROT 7.9  ALBUMIN 3.6    Lab 01/18/12 0003  LIPASE 64*  AMYLASE --   No results found for this basename: AMMONIA:2 in the last 168 hours CBC:  Lab 01/19/12 0530 01/18/12 0003  WBC 4.4 7.8  NEUTROABS -- 4.8  HGB 11.1* 11.9*  HCT 34.0* 36.2  MCV 93.4 92.1  PLT 192 217   Cardiac Enzymes: No results found for this basename: CKTOTAL:3,CKMB:3,CKMBINDEX:3,TROPONINI:3 in the last 168 hours BNP: No results found for this basename: PROBNP:3 in the last 168 hours D-Dimer: No results found for this basename: DDIMER:2 in the last 168 hours CBG: No results found for this basename: GLUCAP:6 in the last 168 hours Hemoglobin A1C: No results found for this basename: HGBA1C in the last 168 hours Fasting Lipid Panel: No results found for this basename: CHOL,HDL,LDLCALC,TRIG,CHOLHDL,LDLDIRECT in the last 168 hours Thyroid Function Tests: No results found for this basename:  TSH,T4TOTAL,FREET4,T3FREE,THYROIDAB in the last 168 hours Coagulation: No results found for this basename: LABPROT:4,INR:4 in the last 168 hours Anemia Panel: No results found for this basename: VITAMINB12,FOLATE,FERRITIN,TIBC,IRON,RETICCTPCT in the last 168 hours Urine Drug Screen: Drugs of Abuse  No results found for this basename: labopia, cocainscrnur, labbenz, amphetmu, thcu, labbarb    Alcohol Level: No results found for this basename: ETH:2 in the last 168 hours Urinalysis:  Lab 01/18/12 0025  COLORURINE YELLOW  LABSPEC 1.015  PHURINE 8.5*  GLUCOSEU NEGATIVE  HGBUR SMALL*  BILIRUBINUR NEGATIVE  KETONESUR NEGATIVE  PROTEINUR 100*  UROBILINOGEN 0.2  NITRITE NEGATIVE  LEUKOCYTESUR MODERATE*   Micro Results: No results found for this or any previous visit (from the past 240 hour(s)). Studies/Results: Ct Abdomen Pelvis W Contrast  01/18/2012  *RADIOLOGY REPORT*  Clinical Data: Nausea, emesis, abdominal pain and shortness of breath.  Back pain.  History of spina bifida and chronic renal failure; on dialysis.  CT ABDOMEN AND PELVIS WITH CONTRAST  Technique:  Multidetector CT imaging of the abdomen and pelvis was performed following the standard protocol during bolus administration of intravenous contrast.  Contrast: 59mL OMNIPAQUE IOHEXOL 300 MG/ML  SOLN  Comparison: Renal ultrasound performed 04/01/2005, and abdominal radiograph performed earlier today at 02:01  a.m.  Findings: The visualized lung bases are clear.  Scattered small hypodensities within the liver, measuring up to 1.0 cm in size, are nonspecific but may reflect small cysts.  The spleen is unremarkable in appearance.  The pancreas is grossly unremarkable.  The gallbladder is within normal limits.  The adrenal glands are not well assessed; no suspicious adrenal gland masses are seen.  Borderline prominent periaortic nodes are seen at the level of the diaphragm; these may reflect the patient's baseline.  There is a markedly  abnormal horseshoe type kidney extending across the midline, with numerous enlarged cystic spaces.  Associated marked dilatation of the ureters is noted; findings are compatible with severe chronic hydronephrosis.  No free fluid is identified.  The small bowel is largely filled with contrast and is grossly unremarkable in appearance, though difficult to fully characterize due to surrounding structures.  The stomach is within normal limits.  No acute vascular abnormalities are seen.  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  There is marked dilatation of the rectum to 7.1 cm in diameter, filled with very dense stool, and demonstrating surrounding wall thickening, concerning for impaction.  Stool is noted partially filling the remainder of the colon; minimal contrast is noted extending into the ascending colon.  The appendix is difficult to fully characterize; there is no evidence for appendicitis.  The bladder is markedly thick-walled, with multiple scattered diverticula and open vesicoureteral junctions, reflecting chronic reflux up both markedly enlarged ureters.  The uterus is not well assessed.  No suspicious adnexal masses are characterized; the ovaries appear grossly unremarkable.  No inguinal lymphadenopathy is seen.  No acute osseous abnormalities are identified.  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  IMPRESSION:  1.  Marked dilatation of the rectum to 7.1 cm in diameter, filled with very dense stool, and demonstrating surrounding wall thickening, concerning for impaction.  Stool partially fills the remainder of the colon. 2.  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  Associated marked dilatation of both ureters, with open reflux into markedly enlarged ureters bilaterally.  Significant wall thickening along the bladder reflects chronic inflammation, with scattered  bladder diverticula. Findings compatible with chronic severe hydronephrosis. 3.  Scattered small hypodensities within the liver are nonspecific but may reflect small cysts. 4.  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  Original Report Authenticated By: Santa Lighter, M.D.   Dg Abd Acute W/chest  01/18/2012  *RADIOLOGY REPORT*  Clinical Data: Abdominal pain, nausea and vomiting.  Shortness of breath.  Patient on dialysis.  ACUTE ABDOMEN SERIES (ABDOMEN 2 VIEW & CHEST 1 VIEW)  Comparison: Chest radiograph performed 05/02/2011, and abdominal radiograph performed 01/10/2011  Findings: The lungs are well-aerated and clear.  There is no evidence of focal opacification, pleural effusion or pneumothorax. The cardiomediastinal silhouette is within normal limits.  The visualized bowel gas pattern is unremarkable.  Scattered stool and air are seen within the colon; there is no evidence of small bowel dilatation to suggest obstruction.  No free intra-abdominal air is identified on the provided upright view.  Chronic osseous abnormalities are stable in appearance.  IMPRESSION:  1.  Unremarkable bowel gas pattern; no free intra-abdominal air seen. 2.  No acute cardiopulmonary process identified.  Original Report Authenticated By: Santa Lighter, M.D.   Scheduled Meds:   . cefUROXime  250  mg Oral BID WC  . heparin  5,000 Units Subcutaneous Q8H  . polyethylene glycol  17 g Oral q1800  . potassium chloride  30 mEq Oral BID  . sodium chloride  3 mL Intravenous Q12H  . sodium chloride  3 mL Intravenous Q12H  . DISCONTD: cefTRIAXone (ROCEPHIN)  IV  1 g Intravenous Q24H  . DISCONTD: polyethylene glycol  17 g Oral Daily   Continuous Infusions:  PRN Meds:.sodium chloride, benzocaine, bisacodyl, heparin, heparin, HYDROmorphone (DILAUDID) injection, ondansetron (ZOFRAN) IV, ondansetron, sodium  chloride  Assessment/Plan: Principal Problem:  *Pyelonephritis, acute Active Problems:  Hypokalemia  Constipation  Tachycardia, resolved  ESRD (end stage renal disease) on dialysis  Anemia  HTN (hypertension)  Spina bifida  Changed to by mouth antibiotics. Discontinue telemetry. Replete potassium. Dialysis tomorrow and home if stable.   LOS: 2 days   Raymie Trani L 01/19/2012, 11:30 AM

## 2012-01-19 NOTE — Care Management Note (Unsigned)
    Page 1 of 1   01/19/2012     2:10:22 PM   CARE MANAGEMENT NOTE 01/19/2012  Patient:  [name], [name]   Account Number:  [id number]  Date Initiated:  01/19/2012  Documentation initiated by:  Claretha Cooper  Subjective/Objective Assessment:   Pt admitted from home where she lives with her grandmother. All communication with pt was CM asking questions and Pt. nodding her head. Pt is independent and mobile iwth WC. She self caths. Denies any HH needs     Action/Plan:   No CM needs identified   Anticipated DC Date:  01/21/2012   Anticipated DC Plan:  [place]  CM consult      Choice offered to / List presented to:             Status of service:  In process, will continue to follow Medicare Important Message given?   (If response is "NO", the following Medicare IM given date fields will be blank) Date Medicare IM given:   Date Additional Medicare IM given:    Discharge Disposition:    Per UR Regulation:    If discussed at Long Length of Stay Meetings, dates discussed:    Comments:  01/19/12 [place]

## 2012-01-19 NOTE — Progress Notes (Signed)
Subjective: Interval History: has no complaint of nausea or vomiting. Patient denies any difficulty in breathing Presently her she doesn't offer any complaints..  Objective: Vital signs in last 24 hours: Temp:  [97.3 F (36.3 C)-99.3 F (37.4 C)] 98.4 F (36.9 C) (06/24 0442) Pulse Rate:  [90-96] 90  (06/24 0442) Resp:  [20] 20  (06/24 0442) BP: (106-108)/(61-70) 108/70 mmHg (06/24 0442) SpO2:  [98 %-100 %] 100 % (06/24 0442) Weight change:   Intake/Output from previous day: 06/23 0701 - 06/24 0700 In: 360.7 [P.O.:240; I.V.:120.7] Out: 2 [Urine:1; Stool:1] Intake/Output this shift:    General appearance: alert, cooperative and no distress Resp: clear to auscultation bilaterally Cardio: regular rate and rhythm, S1, S2 normal, no murmur, click, rub or gallop GI: soft, non-tender; bowel sounds normal; no masses,  no organomegaly Extremities: extremities normal, atraumatic, no cyanosis or edema  Lab Results:  Basename 01/19/12 0530 01/18/12 0003  WBC 4.4 7.8  HGB 11.1* 11.9*  HCT 34.0* 36.2  PLT 192 217   BMET:  Basename 01/19/12 0530 01/18/12 0003  NA 140 136  K 3.1* 3.1*  CL 103 99  CO2 20 27  GLUCOSE 103* 94  BUN 29* 20  CREATININE 4.23* 2.79*  CALCIUM 9.1 9.4   No results found for this basename: PTH:2 in the last 72 hours Iron Studies: No results found for this basename: IRON,TIBC,TRANSFERRIN,FERRITIN in the last 72 hours  Studies/Results: Ct Abdomen Pelvis W Contrast  01/18/2012  *RADIOLOGY REPORT*  Clinical Data: Nausea, emesis, abdominal pain and shortness of breath.  Back pain.  History of spina bifida and chronic renal failure; on dialysis.  CT ABDOMEN AND PELVIS WITH CONTRAST  Technique:  Multidetector CT imaging of the abdomen and pelvis was performed following the standard protocol during bolus administration of intravenous contrast.  Contrast: 73mL OMNIPAQUE IOHEXOL 300 MG/ML  SOLN  Comparison: Renal ultrasound performed 04/01/2005, and abdominal  radiograph performed earlier today at 02:01 a.m.  Findings: The visualized lung bases are clear.  Scattered small hypodensities within the liver, measuring up to 1.0 cm in size, are nonspecific but may reflect small cysts.  The spleen is unremarkable in appearance.  The pancreas is grossly unremarkable.  The gallbladder is within normal limits.  The adrenal glands are not well assessed; no suspicious adrenal gland masses are seen.  Borderline prominent periaortic nodes are seen at the level of the diaphragm; these may reflect the patient'Haynes baseline.  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  Associated marked dilatation of the ureters is noted; findings are compatible with severe chronic hydronephrosis.  No free fluid is identified.  The small bowel is largely filled with contrast and is grossly unremarkable in appearance, though difficult to fully characterize due to surrounding structures.  The stomach is within normal limits.  No acute vascular abnormalities are seen.  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  There is marked dilatation of the rectum to 7.1 cm in diameter, filled with very dense stool, and demonstrating surrounding wall thickening, concerning for impaction.  Stool is noted partially filling the remainder of the colon; minimal contrast is noted extending into the ascending colon.  The appendix is difficult to fully characterize; there is no evidence for appendicitis.  The bladder is markedly thick-walled, with multiple scattered diverticula and open vesicoureteral junctions, reflecting chronic reflux up both markedly enlarged ureters.  The uterus is not well assessed.  No suspicious adnexal masses are  characterized; the ovaries appear grossly unremarkable.  No inguinal lymphadenopathy is seen.  No acute osseous abnormalities are identified.  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  IMPRESSION:  1.  Marked dilatation of the rectum to 7.1 cm in diameter, filled with very dense stool, and demonstrating surrounding wall thickening, concerning for impaction.  Stool partially fills the remainder of the colon. 2.  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  Associated marked dilatation of both ureters, with open reflux into markedly enlarged ureters bilaterally.  Significant wall thickening along the bladder reflects chronic inflammation, with scattered bladder diverticula. Findings compatible with chronic severe hydronephrosis. 3.  Scattered small hypodensities within the liver are nonspecific but may reflect small cysts. 4.  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  Original Report Authenticated By: Santa Lighter, M.D.   Dg Abd Acute W/chest  01/18/2012  *RADIOLOGY REPORT*  Clinical Data: Abdominal pain, nausea and vomiting.  Shortness of breath.  Patient on dialysis.  ACUTE ABDOMEN SERIES (ABDOMEN 2 VIEW & CHEST 1 VIEW)  Comparison: Chest radiograph performed 05/02/2011, and abdominal radiograph performed 01/10/2011  Findings: The lungs are well-aerated and clear.  There is no evidence of focal opacification, pleural effusion or pneumothorax. The cardiomediastinal silhouette is within normal limits.  The visualized bowel gas pattern is unremarkable.  Scattered stool and air are seen within the colon; there is no evidence of small bowel dilatation to suggest obstruction.  No free intra-abdominal air is identified on the provided upright view.  Chronic osseous abnormalities are stable in appearance.  IMPRESSION:  1.  Unremarkable bowel gas pattern; no free intra-abdominal air seen. 2.  No acute cardiopulmonary process identified.  Original Report Authenticated By: Santa Lighter,  M.D.    I have reviewed the patient'Haynes current medications.  Assessment/Plan: Problem #1 end-stage renal disease she status post hemodialysis on Saturday her BUN and creatinine is was in acceptable range patient doesn't have any uremic sign and symptoms. Problem #2 hypokalemia potassium still remains low patient received KCl 40 mEq one dose. Problem #3 hypertension her blood pressure seems to be controlled very well  Problem #4 history of tachycardia Problem #5 history of anemia her hemoglobin and hematocrit seems to be stable. Problem #6 history of pyelonephritis patient presently a febrile with normal white cell count. Problem #7 spinal by Joanne Chars Plan: We'll give her KCl 30 mEq 2 doses Will dialyze patient tomorrow we'll use 4 K /2.5 calcium bath. We'll check her CBC, basic metabolic panel and phosphorus in the morning. We'll use Epogen 4000 units IV after each dialysis.    LOS: 2 days   [name] 01/19/2012,8:12 AM

## 2012-01-19 NOTE — Progress Notes (Signed)
UR Chart Review Completed  

## 2012-01-20 ENCOUNTER — Inpatient Hospital Stay (HOSPITAL_COMMUNITY): Payer: Medicare Other

## 2012-01-20 DIAGNOSIS — D631 Anemia in chronic kidney disease: Secondary | ICD-10-CM

## 2012-01-20 DIAGNOSIS — N186 End stage renal disease: Secondary | ICD-10-CM

## 2012-01-20 DIAGNOSIS — R5381 Other malaise: Secondary | ICD-10-CM

## 2012-01-20 DIAGNOSIS — N039 Chronic nephritic syndrome with unspecified morphologic changes: Secondary | ICD-10-CM

## 2012-01-20 LAB — URINE CULTURE
Colony Count: >=100000
Culture  Setup Time: 201306232041

## 2012-01-20 LAB — BASIC METABOLIC PANEL
BUN: 32 mg/dL — ABNORMAL HIGH (ref 6–23)
Calcium: 8.8 mg/dL (ref 8.4–10.5)
Creatinine, Ser: 4.64 mg/dL — ABNORMAL HIGH (ref 0.50–1.10)
GFR calc non Af Amer: 13 mL/min — ABNORMAL LOW (ref >90)
Glucose, Bld: 81 mg/dL (ref 70–99)

## 2012-01-20 LAB — CBC
HCT: 33.5 % — ABNORMAL LOW (ref 36.0–46.0)
Hemoglobin: 10.9 g/dL — ABNORMAL LOW (ref 12.0–15.0)
MCH: 30.1 pg (ref 26.0–34.0)
MCHC: 32.5 g/dL (ref 30.0–36.0)

## 2012-01-20 MED ORDER — CEFUROXIME AXETIL 250 MG PO TABS: 250.0000 mg | ORAL_TABLET | Freq: Two times a day (BID) | ORAL | Status: AC

## 2012-01-20 MED ORDER — EPOETIN ALFA 10000 UNIT/ML IJ SOLN
10000.0000 [IU] | INTRAMUSCULAR | Status: DC
  Administered 2012-01-20: 10000 [IU] via INTRAVENOUS
  Filled 2012-01-20 (×3): qty 1

## 2012-01-20 MED ORDER — EPOETIN ALFA 10000 UNIT/ML IJ SOLN: 10000.0000 [IU] | INTRAMUSCULAR | Status: DC

## 2012-01-20 MED ORDER — SEVELAMER CARBONATE 800 MG PO TABS
1600.0000 mg | ORAL_TABLET | Freq: Three times a day (TID) | ORAL | Status: DC
  Administered 2012-01-20 (×2): 1600 mg via ORAL
  Filled 2012-01-20 (×2): qty 2

## 2012-01-20 MED ORDER — SEVELAMER CARBONATE 800 MG PO TABS: 1600.0000 mg | ORAL_TABLET | Freq: Three times a day (TID) | ORAL | Status: DC

## 2012-01-20 MED ORDER — POLYETHYLENE GLYCOL 3350 17 G PO PACK: 17.0000 g | PACK | ORAL | Status: AC | PRN

## 2012-01-20 NOTE — Progress Notes (Signed)
Prescriptions given,states understanding of discharge

## 2012-01-20 NOTE — Progress Notes (Signed)
Subjective: Interval History: has no complaint of nausea or vomiting. Patient has this moment offers no complaints..  Objective: Vital signs in last 24 hours: Temp:  [97.8 F (36.6 C)-98 F (36.7 C)] 97.8 F (36.6 C) (06/25 0133) Pulse Rate:  [84-88] 84  (06/25 0133) Resp:  [18-20] 18  (06/25 0133) BP: (103-110)/(68-70) 103/68 mmHg (06/25 0133) SpO2:  [100 %] 100 % (06/25 0133) Weight change:   Intake/Output from previous day: 06/24 0701 - 06/25 0700 In: 940 [P.O.:840] Out: 250 [Urine:250] Intake/Output this shift:    General appearance: alert and no distress Resp: clear to auscultation bilaterally Cardio: regular rate and rhythm, S1, S2 normal, no murmur, click, rub or gallop GI: soft, non-tender; bowel sounds normal; no masses,  no organomegaly Extremities: extremities normal, atraumatic, no cyanosis or edema  Lab Results:  Basename 01/20/12 0528 01/19/12 0530  WBC 4.2 4.4  HGB 10.9* 11.1*  HCT 33.5* 34.0*  PLT 223 192   BMET:  Basename 01/20/12 0528 01/19/12 0530  NA 139 140  K 3.6 3.1*  CL 105 103  CO2 20 20  GLUCOSE 81 103*  BUN 32* 29*  CREATININE 4.64* 4.23*  CALCIUM 8.8 9.1   No results found for this basename: PTH:2 in the last 72 hours Iron Studies: No results found for this basename: IRON,TIBC,TRANSFERRIN,FERRITIN in the last 72 hours  Studies/Results: No results found.  I have reviewed the patient's current medications.  Assessment/Plan: Problem #1 end-stage renal disease her BUN is 32 creatinine is 4.64 patient is due for dialysis today. Presently she is a symptomatic. Problem #2 hypokalemia potassium is 3.6 corrected Problem #3 history of anemia her hemoglobin and hematocrit seems to be declining. Problem #4 history of hypertension blood pressure seems to be controlled very well Problem #5 history of tachycardia Problem #6 hyperphosphatemia patient is not on a binder. Problem#7  history of pyelonephritis. Plan: We'll do hemodialysis  today We'll start her on renvella  800 mg 2 tablet by mouth 3 times a day with each meal an 1 with snack We'll start her also on Epogen 10,000 units IV after each dialysis. If patient is going to be discharged she will be going to her regular dialysis unit as outpatient.     LOS: 3 days   [name] 01/20/2012,7:12 AM

## 2012-02-04 NOTE — Discharge Summary (Addendum)
Physician Discharge Summary  Patient ID: [name] MRN: [id number] DOB/AGE: 1990-12-28 21 y.o.  Admit date: 01/17/2012 Discharge date: 02/04/2012  Discharge Diagnoses:  Principal Problem:  *Pyelonephritis, acute Active Problems:  Hypokalemia  Constipation  Tachycardia  ESRD (end stage renal disease) on dialysis  Anemia  HTN (hypertension)  Spina bifida   Medication List  As of 02/04/2012  5:25 PM   TAKE these medications         acetaminophen 325 MG tablet   Commonly known as: TYLENOL   Take 650 mg by mouth daily as needed. For pain      diphenhydrAMINE 25 MG tablet   Commonly known as: BENADRYL   Take 50 mg by mouth every 6 (six) hours as needed. For allergies and mosquito bites      epoetin alfa 10000 UNIT/ML injection   Commonly known as: EPOGEN,PROCRIT   Inject 1 mL (10,000 Units total) into the skin 3 (three) times a week.      sevelamer 800 MG tablet   Commonly known as: RENVELA   Take 2 tablets (1,600 mg total) by mouth 3 (three) times daily with meals.           Ceftin 500 mg by mouth twice a day until gone  Discharge Orders    Future Orders Please Complete By Expires   Diet general      Activity as tolerated - No restrictions         Disposition: 01-Home or Self Care  Discharged Condition: stable  Consults: Treatment Team:  Harriett Sine, MD  Labs:    Sodium   136 140 139          Potassium   3.1 3.1 3.6          Chloride   99 103 105          CO2   27 20 20           BUN   20 29 32          Creatinine, Ser   2.79 4.23  4.64          Calcium   9.4 9.1 8.8          GFR calc non Af Amer   24 14 13           GFR calc Af Amer   27  17  15            Glucose, Bld   94 103 81          Phosphorus     6.4          Alkaline Phosphatase   111            Albumin   3.6            Lipase   64            AST   20            ALT   7            Total Protein   7.9            Total Bilirubin   0.1             CBC    WBC   7.8 4.4 4.2           RBC   3.93 3.64 3.62          Hemoglobin  11.9 11.1 10.9          HCT   36.2 34.0 33.5          MCV   92.1 93.4 92.5          MCH   30.3 30.5 30.1          MCHC   32.9 32.6 32.5          RDW   12.3 12.7 12.6          Platelets   217 192 223           DIFFERENTIAL    Neutrophils Relative   62            Lymphocytes Relative   28            Monocytes Relative   7            Eosinophils Relative   3            Basophils Relative   0            Neutro Abs   4.8            Lymphs Abs   2.2            Monocytes Absolute   0.5            Eosinophils Absolute   0.2            Basophils Absolute   0.0             DIABETES    Glucose, Bld   94 103 81           GONADAL    Preg Test, Ur   24 mIU/mL"NEGATIVE 24 mIU/mL" border=0 src="[email or web address]:/[email or web address]/[email or web address]/EN-[email or web address]/[email or web address]" width=5 height=10             URINALYSIS    Color, Urine   YELLOW            APPearance   CLOUDY            Specific Gravity, Urine   1.015            pH   8.5            Glucose, UA   NEGATIVE            Bilirubin Urine   NEGATIVE            Ketones, ur   NEGATIVE            Protein, ur   100            Urobilinogen, UA   0.2            Nitrite   NEGATIVE            Leukocytes, UA   MODERATE            Hgb urine dipstick   SMALL            WBC, UA   TOO NUMEROUS TO COUNT            RBC / HPF   3-6            Squamous Epithelial / LPF   RARE            Bacteria, UA   MANY             URINE CHEMISTRY    Preg Test, Ur   24 mIU/mL"NEGATIVE 24 mIU/mL" border=0 src="[email or web address]:/[email or web address]"  width=5 height=10             MICROBIOLOGY    Organism ID, Bacteria   ESCHERICHIA COLI  , Pansensitive  Diagnostics:  Ct Abdomen Pelvis W Contrast  01/18/2012  *RADIOLOGY REPORT*  Clinical Data: Nausea, emesis, abdominal pain and shortness of breath.  Back pain.  History of spina bifida and chronic renal  failure; on dialysis.  CT ABDOMEN AND PELVIS WITH CONTRAST  Technique:  Multidetector CT imaging of the abdomen and pelvis was performed following the standard protocol during bolus administration of intravenous contrast.  Contrast: 60mL OMNIPAQUE IOHEXOL 300 MG/ML  SOLN  Comparison: Renal ultrasound performed 04/01/2005, and abdominal radiograph performed earlier today at 02:01 a.m.  Findings: The visualized lung bases are clear.  Scattered small hypodensities within the liver, measuring up to 1.0 cm in size, are nonspecific but may reflect small cysts.  The spleen is unremarkable in appearance.  The pancreas is grossly unremarkable.  The gallbladder is within normal limits.  The adrenal glands are not well assessed; no suspicious adrenal gland masses are seen.  Borderline prominent periaortic nodes are seen at the level of the diaphragm; these may reflect the patient's baseline.  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  Associated marked dilatation of the ureters is noted; findings are compatible with severe chronic hydronephrosis.  No free fluid is identified.  The small bowel is largely filled with contrast and is grossly unremarkable in appearance, though difficult to fully characterize due to surrounding structures.  The stomach is within normal limits.  No acute vascular abnormalities are seen.  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  There is marked dilatation of the rectum to 7.1 cm in diameter, filled with very dense stool, and demonstrating surrounding wall thickening, concerning for impaction.  Stool is noted partially filling the remainder of the colon; minimal contrast is noted extending into the ascending colon.  The appendix is difficult to fully characterize; there is no evidence for appendicitis.  The bladder is markedly thick-walled, with multiple scattered diverticula and open vesicoureteral  junctions, reflecting chronic reflux up both markedly enlarged ureters.  The uterus is not well assessed.  No suspicious adnexal masses are characterized; the ovaries appear grossly unremarkable.  No inguinal lymphadenopathy is seen.  No acute osseous abnormalities are identified.  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  IMPRESSION:  1.  Marked dilatation of the rectum to 7.1 cm in diameter, filled with very dense stool, and demonstrating surrounding wall thickening, concerning for impaction.  Stool partially fills the remainder of the colon. 2.  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  Associated marked dilatation of both ureters, with open reflux into markedly enlarged ureters bilaterally.  Significant wall thickening along the bladder reflects chronic inflammation, with scattered bladder diverticula. Findings compatible with chronic severe hydronephrosis. 3.  Scattered small hypodensities within the liver are nonspecific but may reflect small cysts. 4.  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  Original Report Authenticated By: Santa Lighter, M.D.   Dg Abd Acute W/chest  01/18/2012  *RADIOLOGY REPORT*  Clinical Data: Abdominal pain, nausea and vomiting.  Shortness of breath.  Patient on dialysis.  ACUTE ABDOMEN SERIES (ABDOMEN 2 VIEW & CHEST 1 VIEW)  Comparison: Chest radiograph performed 05/02/2011, and  abdominal radiograph performed 01/10/2011  Findings: The lungs are well-aerated and clear.  There is no evidence of focal opacification, pleural effusion or pneumothorax. The cardiomediastinal silhouette is within normal limits.  The visualized bowel gas pattern is unremarkable.  Scattered stool and air are seen within the colon; there is no evidence of small bowel dilatation to  suggest obstruction.  No free intra-abdominal air is identified on the provided upright view.  Chronic osseous abnormalities are stable in appearance.  IMPRESSION:  1.  Unremarkable bowel gas pattern; no free intra-abdominal air seen. 2.  No acute cardiopulmonary process identified.  Original Report Authenticated By: Santa Lighter, M.D.    Hospital Course: See H&P for complete admission details. The patient is a pleasant 21 year old black female with a history of spina bifida and end stage renal disease and recurrent urinary tract infections. She presented with abdominal pain, fever, right-sided flank pain, vomiting. She was found to have a urinary tract infection and constipation. She was started on Rocephin. She has an allergy to ciprofloxacin. She was started on a bowel regimen. Her symptoms improved and by the time of discharge she was eating well, no pain and requesting discharge. Urine culture grew out pan sensitive Escherichia coli. Total time on the day of discharge greater than 30 minutes.  Discharge Exam:  Blood pressure 126/82, pulse 107, temperature 97.7 F (36.5 C), temperature source Oral, resp. rate 20, height 3' (0.914 m), weight 29.9 kg (65 lb 14.7 oz), SpO2 99.00%.  Unchanged from 01/19/12  Signed: Doree Barthel L 02/04/2012, 5:25 PM

## 2012-02-20 ENCOUNTER — Inpatient Hospital Stay (HOSPITAL_COMMUNITY)
Admission: EM | Admit: 2012-02-20 | Discharge: 2012-02-23 | DRG: 690 | Disposition: A | Discharge status: Home / Self Care | Payer: Medicare Other | Attending: Internal Medicine | Admitting: Internal Medicine

## 2012-02-20 ENCOUNTER — Emergency Department (HOSPITAL_COMMUNITY): Payer: Medicare Other

## 2012-02-20 ENCOUNTER — Encounter (HOSPITAL_COMMUNITY): Payer: Self-pay | Admitting: Emergency Medicine

## 2012-02-20 DIAGNOSIS — E872 Acidosis, unspecified: Secondary | ICD-10-CM

## 2012-02-20 DIAGNOSIS — D649 Anemia, unspecified: Secondary | ICD-10-CM

## 2012-02-20 DIAGNOSIS — K5900 Constipation, unspecified: Secondary | ICD-10-CM

## 2012-02-20 DIAGNOSIS — Z5900 Homelessness unspecified: Secondary | ICD-10-CM

## 2012-02-20 DIAGNOSIS — Z91040 Latex allergy status: Secondary | ICD-10-CM

## 2012-02-20 DIAGNOSIS — Q059 Spina bifida, unspecified: Secondary | ICD-10-CM

## 2012-02-20 DIAGNOSIS — E871 Hypo-osmolality and hyponatremia: Secondary | ICD-10-CM

## 2012-02-20 DIAGNOSIS — A498 Other bacterial infections of unspecified site: Secondary | ICD-10-CM | POA: Diagnosis present

## 2012-02-20 DIAGNOSIS — R112 Nausea with vomiting, unspecified: Secondary | ICD-10-CM

## 2012-02-20 DIAGNOSIS — I120 Hypertensive chronic kidney disease with stage 5 chronic kidney disease or end stage renal disease: Secondary | ICD-10-CM | POA: Diagnosis present

## 2012-02-20 DIAGNOSIS — Z881 Allergy status to other antibiotic agents status: Secondary | ICD-10-CM

## 2012-02-20 DIAGNOSIS — Z887 Allergy status to serum and vaccine status: Secondary | ICD-10-CM

## 2012-02-20 DIAGNOSIS — N186 End stage renal disease: Secondary | ICD-10-CM | POA: Diagnosis present

## 2012-02-20 DIAGNOSIS — N390 Urinary tract infection, site not specified: Secondary | ICD-10-CM

## 2012-02-20 DIAGNOSIS — R000 Tachycardia, unspecified: Secondary | ICD-10-CM

## 2012-02-20 DIAGNOSIS — R197 Diarrhea, unspecified: Secondary | ICD-10-CM

## 2012-02-20 DIAGNOSIS — E876 Hypokalemia: Secondary | ICD-10-CM

## 2012-02-20 DIAGNOSIS — Z79899 Other long term (current) drug therapy: Secondary | ICD-10-CM

## 2012-02-20 DIAGNOSIS — I10 Essential (primary) hypertension: Secondary | ICD-10-CM | POA: Diagnosis present

## 2012-02-20 DIAGNOSIS — J40 Bronchitis, not specified as acute or chronic: Secondary | ICD-10-CM

## 2012-02-20 DIAGNOSIS — N10 Acute tubulo-interstitial nephritis: Principal | ICD-10-CM

## 2012-02-20 DIAGNOSIS — Z992 Dependence on renal dialysis: Secondary | ICD-10-CM

## 2012-02-20 LAB — CBC WITH DIFFERENTIAL/PLATELET
Basophils Absolute: 0 10*3/uL (ref 0.0–0.1)
Eosinophils Absolute: 0.2 10*3/uL (ref 0.0–0.7)
Eosinophils Relative: 3 % (ref 0–5)
MCH: 29.5 pg (ref 26.0–34.0)
MCV: 87.9 fL (ref 78.0–100.0)
Platelets: 311 10*3/uL (ref 150–400)
RDW: 13 % (ref 11.5–15.5)

## 2012-02-20 LAB — COMPREHENSIVE METABOLIC PANEL
ALT: 7 U/L (ref 0–35)
AST: 14 U/L (ref 0–37)
Calcium: 9.9 mg/dL (ref 8.4–10.5)
GFR calc Af Amer: 13 mL/min — ABNORMAL LOW (ref >90)
Glucose, Bld: 86 mg/dL (ref 70–99)
Sodium: 137 mEq/L (ref 135–145)
Total Protein: 8.5 g/dL — ABNORMAL HIGH (ref 6.0–8.3)

## 2012-02-20 LAB — URINE MICROSCOPIC-ADD ON

## 2012-02-20 LAB — URINALYSIS, ROUTINE W REFLEX MICROSCOPIC
Ketones, ur: NEGATIVE mg/dL
Nitrite: NEGATIVE
Specific Gravity, Urine: 1.008 (ref 1.005–1.030)
pH: 7.5 (ref 5.0–8.0)

## 2012-02-20 MED ORDER — DEXTROSE 5 % IV SOLN
1.0000 g | INTRAVENOUS | Status: DC
  Administered 2012-02-20: 1 g via INTRAVENOUS
  Filled 2012-02-20: qty 10

## 2012-02-20 MED ORDER — SODIUM CHLORIDE 0.9 % IV SOLN
Freq: Once | INTRAVENOUS | Status: AC
  Administered 2012-02-20: 21:00:00 via INTRAVENOUS

## 2012-02-20 MED ORDER — OXYCODONE-ACETAMINOPHEN 5-325 MG PO TABS
1.0000 | ORAL_TABLET | Freq: Once | ORAL | Status: AC
  Administered 2012-02-20: 1 via ORAL
  Filled 2012-02-20: qty 1

## 2012-02-20 MED ORDER — ONDANSETRON HCL 4 MG/2ML IJ SOLN
4.0000 mg | Freq: Once | INTRAMUSCULAR | Status: AC
  Administered 2012-02-20: 4 mg via INTRAVENOUS
  Filled 2012-02-20: qty 2

## 2012-02-20 NOTE — ED Notes (Signed)
Pt st's she has had nausea and vomiting for 2 days.  St's had dialysis yesterday but did not complete due to being HTN.  St's she could not return today because she did not have transportation.

## 2012-02-20 NOTE — ED Notes (Signed)
Patient transported to X-ray 

## 2012-02-20 NOTE — ED Provider Notes (Signed)
History     CSN: [id number]  Arrival date & time 02/20/12  1743   First MD Initiated Contact with Patient 02/20/12 2050      Chief Complaint  Patient presents with  . Emesis    (Consider location/radiation/quality/duration/timing/severity/associated sxs/prior treatment) Patient is a 21 y.o. female presenting with vomiting. The history is provided by the patient.  Emesis    patient here with vomiting and nausea x2 days. Notes fever at home up to 105 and some chills with vomiting and no diarrhea. Denies any cough or shortness of breath. Did not complete her dialysis yesterday. Does make urine. Has been using Tylenol for her fever as needed. Nothing makes her symptoms worse.  Past Medical History  Diagnosis Date  . ESRD (end stage renal disease) on dialysis   . Spina bifida   . UTI (lower urinary tract infection)   . HTN (hypertension) 05/02/2011  . Anemia associated with chronic renal failure   . 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  . Blood transfusion   . Dialysis care     Past Surgical History  Procedure Date  . Av fistula placement     left arm    Family History  Problem Relation Age of Onset  . Kidney cancer Other     History  Substance Use Topics  . Smoking status: Never Smoker   . Smokeless tobacco: Not on file  . Alcohol Use: No    OB History    Grav Para Term Preterm Abortions TAB SAB Ect Mult Living                  Review of Systems  Gastrointestinal: Positive for vomiting.  All other systems reviewed and are negative.    Allergies  Ciprofloxacin; Other; Peanut-containing drug products; Influenza vaccines; Tetanus toxoids; and Latex  Home Medications   Current Outpatient Rx  Name Route Sig Dispense Refill  . ACETAMINOPHEN 325 MG PO TABS Oral Take 650 mg by mouth daily as needed. For pain    . IBUPROFEN 200 MG PO TABS Oral Take 600 mg by mouth every 6 (six) hours as needed. For pain      BP 112/66  Pulse 105  Temp  98.8 F (37.1 C) (Oral)  SpO2 96%  Physical Exam  Nursing note and vitals reviewed. Constitutional: She is oriented to person, place, and time. She appears well-developed and well-nourished.  Non-toxic appearance. No distress.  HENT:  Head: Normocephalic and atraumatic.  Eyes: Conjunctivae, EOM and lids are normal. Pupils are equal, round, and reactive to light.  Neck: Normal range of motion. Neck supple. No rigidity. No tracheal deviation present. No mass present.  Cardiovascular: Normal heart sounds.  Tachycardia present.  Exam reveals no gallop.   No murmur heard. Pulmonary/Chest: Effort normal and breath sounds normal. No stridor. No respiratory distress. She has no decreased breath sounds. She has no wheezes. She has no rhonchi. She has no rales.  Abdominal: Soft. Normal appearance and bowel sounds are normal. She exhibits no distension. There is no tenderness. There is no rebound and no CVA tenderness.  Musculoskeletal: Normal range of motion. She exhibits no edema and no tenderness.       Spina bifida noted  Neurological: She is alert and oriented to person, place, and time. She has normal strength. No cranial nerve deficit or sensory deficit. GCS eye subscore is 4. GCS verbal subscore is 5. GCS motor subscore is 6.  Skin: Skin is  warm and dry. No abrasion and no rash noted.  Psychiatric: She has a normal mood and affect. Her speech is normal and behavior is normal.    ED Course  Procedures (including critical care time)  Labs Reviewed - No data to display No results found.   No diagnosis found.    MDM  Pt given rocephin and emetics--will be admitted for uti        Leota Jacobsen, MD 02/20/12 2249

## 2012-02-21 ENCOUNTER — Inpatient Hospital Stay (HOSPITAL_COMMUNITY): Payer: Medicare Other

## 2012-02-21 DIAGNOSIS — N10 Acute tubulo-interstitial nephritis: Principal | ICD-10-CM

## 2012-02-21 DIAGNOSIS — R112 Nausea with vomiting, unspecified: Secondary | ICD-10-CM

## 2012-02-21 DIAGNOSIS — E876 Hypokalemia: Secondary | ICD-10-CM

## 2012-02-21 LAB — BASIC METABOLIC PANEL
GFR calc Af Amer: 12 mL/min — ABNORMAL LOW (ref >90)
GFR calc non Af Amer: 11 mL/min — ABNORMAL LOW (ref >90)
Potassium: 3.4 mEq/L — ABNORMAL LOW (ref 3.5–5.1)
Sodium: 140 mEq/L (ref 135–145)

## 2012-02-21 LAB — CBC
Hemoglobin: 9.9 g/dL — ABNORMAL LOW (ref 12.0–15.0)
MCHC: 32.8 g/dL (ref 30.0–36.0)
Platelets: 273 10*3/uL (ref 150–400)
RDW: 13.1 % (ref 11.5–15.5)

## 2012-02-21 MED ORDER — IBUPROFEN 600 MG PO TABS
600.0000 mg | ORAL_TABLET | Freq: Four times a day (QID) | ORAL | Status: DC | PRN
  Filled 2012-02-21: qty 1

## 2012-02-21 MED ORDER — CAMPHOR-MENTHOL 0.5-0.5 % EX LOTN
1.0000 "application " | TOPICAL_LOTION | Freq: Three times a day (TID) | CUTANEOUS | Status: DC | PRN
  Filled 2012-02-21: qty 222

## 2012-02-21 MED ORDER — NEPRO/CARBSTEADY PO LIQD: 237.0000 mL | Freq: Three times a day (TID) | ORAL | Status: DC | PRN

## 2012-02-21 MED ORDER — SODIUM CHLORIDE 0.9 % IV SOLN: 250.0000 mL | INTRAVENOUS | Status: DC | PRN

## 2012-02-21 MED ORDER — ACETAMINOPHEN 325 MG PO TABS
650.0000 mg | ORAL_TABLET | Freq: Four times a day (QID) | ORAL | Status: DC | PRN
  Administered 2012-02-21 – 2012-02-22 (×2): 650 mg via ORAL
  Filled 2012-02-21: qty 2

## 2012-02-21 MED ORDER — DEXTROSE 5 % IV SOLN
1.0000 g | Freq: Every day | INTRAVENOUS | Status: DC
  Administered 2012-02-21 – 2012-02-22 (×2): 1 g via INTRAVENOUS
  Filled 2012-02-21 (×3): qty 10

## 2012-02-21 MED ORDER — ONDANSETRON HCL 4 MG PO TABS
4.0000 mg | ORAL_TABLET | Freq: Four times a day (QID) | ORAL | Status: DC | PRN
  Administered 2012-02-22: 4 mg via ORAL
  Filled 2012-02-21: qty 1

## 2012-02-21 MED ORDER — ZOLPIDEM TARTRATE 5 MG PO TABS: 5.0000 mg | ORAL_TABLET | Freq: Every evening | ORAL | Status: DC | PRN

## 2012-02-21 MED ORDER — HEPARIN SODIUM (PORCINE) 1000 UNIT/ML DIALYSIS
20.0000 [IU]/kg | INTRAMUSCULAR | Status: DC | PRN
  Administered 2012-02-21: 600 [IU] via INTRAVENOUS_CENTRAL
  Filled 2012-02-21: qty 1

## 2012-02-21 MED ORDER — CALCIUM CARBONATE 1250 MG/5ML PO SUSP: 500.0000 mg | Freq: Four times a day (QID) | ORAL | Status: DC | PRN

## 2012-02-21 MED ORDER — ALTEPLASE 2 MG IJ SOLR
2.0000 mg | Freq: Once | INTRAMUSCULAR | Status: AC | PRN
  Filled 2012-02-21: qty 2

## 2012-02-21 MED ORDER — SODIUM CHLORIDE 0.9 % IV SOLN: 100.0000 mL | INTRAVENOUS | Status: DC | PRN

## 2012-02-21 MED ORDER — PARICALCITOL 5 MCG/ML IV SOLN
INTRAVENOUS | Status: AC
  Administered 2012-02-21: 5 ug via INTRAVENOUS
  Filled 2012-02-21: qty 1

## 2012-02-21 MED ORDER — PARICALCITOL 5 MCG/ML IV SOLN
5.0000 ug | INTRAVENOUS | Status: DC
  Administered 2012-02-21: 5 ug via INTRAVENOUS
  Filled 2012-02-21: qty 1

## 2012-02-21 MED ORDER — HEPARIN SODIUM (PORCINE) 5000 UNIT/ML IJ SOLN
5000.0000 [IU] | Freq: Three times a day (TID) | INTRAMUSCULAR | Status: DC
  Filled 2012-02-21 (×9): qty 1

## 2012-02-21 MED ORDER — ACETAMINOPHEN 325 MG PO TABS
650.0000 mg | ORAL_TABLET | Freq: Every day | ORAL | Status: DC | PRN
  Filled 2012-02-21: qty 2

## 2012-02-21 MED ORDER — DARBEPOETIN ALFA-POLYSORBATE 60 MCG/0.3ML IJ SOLN
INTRAMUSCULAR | Status: AC
  Administered 2012-02-21: 60 ug via INTRAVENOUS
  Filled 2012-02-21: qty 0.3

## 2012-02-21 MED ORDER — SORBITOL 70 % SOLN: 30.0000 mL | Status: DC | PRN

## 2012-02-21 MED ORDER — ONDANSETRON HCL 4 MG/2ML IJ SOLN: 4.0000 mg | Freq: Four times a day (QID) | INTRAMUSCULAR | Status: DC | PRN

## 2012-02-21 MED ORDER — ONDANSETRON HCL 4 MG PO TABS: 4.0000 mg | ORAL_TABLET | Freq: Four times a day (QID) | ORAL | Status: DC | PRN

## 2012-02-21 MED ORDER — LIDOCAINE HCL (PF) 1 % IJ SOLN: 5.0000 mL | INTRAMUSCULAR | Status: DC | PRN

## 2012-02-21 MED ORDER — HEPARIN SODIUM (PORCINE) 1000 UNIT/ML DIALYSIS
1000.0000 [IU] | INTRAMUSCULAR | Status: DC | PRN
  Filled 2012-02-21: qty 1

## 2012-02-21 MED ORDER — SODIUM CHLORIDE 0.9 % IJ SOLN
3.0000 mL | Freq: Two times a day (BID) | INTRAMUSCULAR | Status: DC
  Administered 2012-02-21 – 2012-02-23 (×5): 3 mL via INTRAVENOUS

## 2012-02-21 MED ORDER — SODIUM CHLORIDE 0.9 % IJ SOLN: 3.0000 mL | INTRAMUSCULAR | Status: DC | PRN

## 2012-02-21 MED ORDER — HYDROXYZINE HCL 25 MG PO TABS: 25.0000 mg | ORAL_TABLET | Freq: Three times a day (TID) | ORAL | Status: DC | PRN

## 2012-02-21 MED ORDER — DARBEPOETIN ALFA-POLYSORBATE 60 MCG/0.3ML IJ SOLN
60.0000 ug | INTRAMUSCULAR | Status: DC
  Administered 2012-02-21: 60 ug via INTRAVENOUS
  Filled 2012-02-21: qty 0.3

## 2012-02-21 MED ORDER — LIDOCAINE-PRILOCAINE 2.5-2.5 % EX CREA: 1.0000 "application " | TOPICAL_CREAM | CUTANEOUS | Status: DC | PRN

## 2012-02-21 MED ORDER — PENTAFLUOROPROP-TETRAFLUOROETH EX AERO: 1.0000 "application " | INHALATION_SPRAY | CUTANEOUS | Status: DC | PRN

## 2012-02-21 MED ORDER — SODIUM CHLORIDE 0.9 % IV SOLN: INTRAVENOUS | Status: DC

## 2012-02-21 MED ORDER — ACETAMINOPHEN 650 MG RE SUPP: 650.0000 mg | Freq: Four times a day (QID) | RECTAL | Status: DC | PRN

## 2012-02-21 MED ORDER — DOCUSATE SODIUM 283 MG RE ENEM: 1.0000 | ENEMA | RECTAL | Status: DC | PRN

## 2012-02-21 NOTE — H&P (Signed)
[facility] History and Physical  [name] [id number] DOB: [birth year]    PCP:   None  Chief Complaint: abdominal pain, N/v and fever up to 105.  HPI: [name] is an 21 y.o. female with hx of ESRD on HD, HTN, spina bifida, anemia, recurrent UTI, presents to the ER complaining of abdominal pain and fever at home up to 105.  She denied SOB or chest pain.  No reported diarrhea, black or bloody stool.  She has gotten dialysis in [place], and [place] [facility], but is changing her dialysis center to [place].  She is still making urine.  Evaluation in the ER included a normal WBC, normal lipase, unremarkable Hb, Cr of 5.10, and K of 3.3. Her CXR only showed atelectasis, and her UA is positive for UTI.  Hospitalist was asked to admit her for UTI.  Rewiew of Systems:  Constitutional: Negative for malaise, fever and chills. No significant weight loss or weight gain Eyes: Negative for eye pain, redness and discharge, diplopia, visual changes, or flashes of light. ENMT: Negative for ear pain, hoarseness, nasal congestion, sinus pressure and sore throat. No headaches; tinnitus, drooling, or problem swallowing. Cardiovascular: Negative for chest pain, palpitations, diaphoresis, dyspnea and peripheral edema. ; No orthopnea, PND Respiratory: Negative for cough, hemoptysis, wheezing and stridor. No pleuritic chestpain. Gastrointestinal: Negative for vomiting, diarrhea, constipation, melena, blood in stool, hematemesis, jaundice and rectal bleeding.    Genitourinary: Negative for frequency, dysuria, incontinence,flank pain and hematuria; Musculoskeletal: Negative for back pain and neck pain. Negative for swelling and trauma.;  Skin: . Negative for pruritus, rash, abrasions, bruising and skin lesion.; ulcerations Neuro: Negative for headache, lightheadedness and neck stiffness. Negative for weakness, altered level of consciousness , altered mental status, extremity weakness, burning feet,  involuntary movement, seizure and syncope.  Psych: negative for anxiety, depression, insomnia, tearfulness, panic attacks, hallucinations, paranoia, suicidal or homicidal ideation    Past Medical History  Diagnosis Date  . ESRD (end stage renal disease) on dialysis   . Spina bifida   . UTI (lower urinary tract infection)   . HTN (hypertension) 05/02/2011  . Anemia associated with chronic renal failure   . 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  . Blood transfusion   . Dialysis care     Past Surgical History  Procedure Date  . Av fistula placement     left arm    Medications:  HOME MEDS: Prior to Admission medications   Medication Sig Start Date End Date Taking? Authorizing Provider  acetaminophen (TYLENOL) 325 MG tablet Take 650 mg by mouth daily as needed. For pain   Yes Historical Provider, MD  ibuprofen (ADVIL,MOTRIN) 200 MG tablet Take 600 mg by mouth every 6 (six) hours as needed. For pain   Yes Historical Provider, MD     Allergies:  Allergies  Allergen Reactions  . Ciprofloxacin Shortness Of Breath, Nausea And Vomiting and Other (See Comments)    HIGH FEVER  . Other Anaphylaxis    Revaclear dialzer  . Peanut-Containing Drug Products Anaphylaxis  . Influenza Vaccines Nausea And Vomiting    High fever  . Tetanus Toxoids Nausea And Vomiting and Other (See Comments)    HIGH FEVER  . Latex Itching and Rash    Social History:   reports that she has never smoked. She does not have any smokeless tobacco history on file. She reports that she does not drink alcohol or use illicit drugs.  Family History: Family History  Problem Relation Age of Onset  . Kidney cancer Other      Physical Exam: Filed Vitals:   02/20/12 1749 02/20/12 2153 02/20/12 2251 02/21/12 0021  BP: 112/66  112/70 116/81  Pulse: 105  100 94  Temp: 98.8 F (37.1 C) 98.8 F (37.1 C)  98 F (36.7 C)  TempSrc: Oral Rectal  Oral  Resp:   18 18  Height:    3' (0.914 m)    Weight:    27.942 kg (61 lb 9.6 oz)  SpO2: 96%  100% 100%   Blood pressure 116/81, pulse 94, temperature 98 F (36.7 C), temperature source Oral, resp. rate 18, height 3' (0.914 m), weight 27.942 kg (61 lb 9.6 oz), SpO2 100.00%.  GEN:  Pleasant  patient lying in the stretcher in no acute distress; cooperative with exam. PSYCH:  alert and oriented x4; does not appear anxious or depressed; affect is appropriate. HEENT: Mucous membranes pink and anicteric; PERRLA; EOM intact; no cervical lymphadenopathy nor thyromegaly or carotid bruit; no JVD; There were no stridor. Neck is very supple. Breasts:: Not examined CHEST WALL: No tenderness CHEST: Normal respiration, clear to auscultation bilaterally.  HEART: Regular rate and rhythm.  There are no murmur, rub, or gallops.   BACK: No kyphosis or scoliosis; no CVA tenderness ABDOMEN: soft and non-tender; no masses, no organomegaly, normal abdominal bowel sounds; no pannus; no intertriginous candida. There is no rebound and no distention. Rectal Exam: Not done EXTREMITIES: very atrophic lower extremities from her spinal bifida. Genitalia: not examined PULSES: 2+ and symmetric SKIN: Normal hydration no rash or ulceration [id number] symmetry, fluent speech. Strength equaled bilaterally upper extremities. Labs on Admission:  Basic Metabolic Panel:  Lab 0000000 2107  NA 137  K 3.3*  CL 100  CO2 20  GLUCOSE 86  BUN 57*  CREATININE 5.10*  CALCIUM 9.9  MG --  PHOS --   Liver Function Tests:  Lab 02/20/12 2107  AST 14  ALT 7  ALKPHOS 123*  BILITOT 0.2*  PROT 8.5*  ALBUMIN 3.9    Lab 02/20/12 2107  LIPASE 56  AMYLASE --   No results found for this basename: AMMONIA:5 in the last 168 hours CBC:  Lab 02/20/12 2107  WBC 6.4  NEUTROABS 3.0  HGB 11.5*  HCT 34.3*  MCV 87.9  PLT 311   Cardiac Enzymes: No results found for this basename: CKTOTAL:5,CKMB:5,CKMBINDEX:5,TROPONINI:5 in the last 168 hours  CBG: No results found for  this basename: GLUCAP:5 in the last 168 hours   Radiological Exams on Admission: Dg Chest 2 View  02/20/2012  *RADIOLOGY REPORT*  Clinical Data: Chest pain  CHEST - 2 VIEW  Comparison: 05/02/2011  Findings: Upper normal heart size. Bibasilar atelectasis.  Low volumes.  No pneumothorax.  No pleural effusion.  IMPRESSION: Bibasilar atelectasis.  Original Report Authenticated By: Jamas Lav, M.D.      Assessment/Plan Present on Admission:  .ESRD (end stage renal disease) on dialysis .Pyelonephritis, acute .HTN (hypertension)   PLAN:  Will admit her to 6700, treat with IV Rocephin.  Urine and BC were done.  Please contact renal service so she will get her dialysis.  She is stable, full Code, and will be admitted to [facility] service.  Other plans as per orders.  Code Status: FULL.   Orvan Falconer, MD. [facility] Pager [phone] 7pm to 7am.  02/21/2012, 2:44 AM

## 2012-02-21 NOTE — Procedures (Signed)
Tolerating hemodialysis. Sleeping currently without hemodynamic instability. [name] C

## 2012-02-21 NOTE — Progress Notes (Addendum)
[facility] PROGRESS NOTE  [name] [id number] DOB: May 18, 1991 DOA: 02/20/2012 PCP: No primary provider on file.  Assessment/Plan: Active Problems:  ESRD (end stage renal disease) on dialysis  HTN (hypertension)  Pyelonephritis, acute  Spina bifida Nausea and vomiting  1. Pyelonephritis - Patient initial U/A showed Turbid urine, with large leukocytes and hgb, as well as few bacteria. - Blood cultures and Urine culture Pending - WBC within normal limit but decreased on recheck. Patient is afebrile.  - Continue Rocephin IV and supportive therapy and taylor antibiotic regimen once susceptibilities return.    2. Nausea and vomiting - At this point most likely secondary to Number one. - Resolving with IV antibiotics - Continue antiemetics PRN  3. ESRD - Per nephrology.  Have discussed case with their care team today. - changed patient status to inpatient.  4. HTN - Stable patient is not on any antihypertensive medication at home. - Will monitor blood pressures  Addendum for problem number 5 5 Hypokalemia:  - Nephrology will most likely address during dialysis - check potassium level tomorrow.  Code Status: Full Family Communication: No family at bedside Disposition Plan: Pending clinical improvement.  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  Has been on HD and presented to the ED with fevers, nausea, and emesis.  U/A showed dirty urine and patient was admitted and is being treated for pyelonephritis.  Blood cultures and urine cultures pending.  Consultants:  Nephrology  Procedures:  HD  Antibiotics:  Rocephin  HPI/Subjective: Patient mentions that her nausea is improved.  No acute issues reported overnight.  Denies any nausea, headaches, blurred vision, abdominal discomfort, fevers, or new rashes.  Objective: Filed Vitals:   02/21/12  1230 02/21/12 1300 02/21/12 1330 02/21/12 1351  BP: 106/54 101/55 108/60 105/54  Pulse: 96 90 107 98  Temp:    97.9 F (36.6 C)  TempSrc:    Oral  Resp:    16  Height:      Weight:    27.2 kg (59 lb 15.4 oz)  SpO2:    100%    Intake/Output Summary (Last 24 hours) at 02/21/12 1437 Last data filed at 02/21/12 1351  Gross per 24 hour  Intake    128 ml  Output    950 ml  Net   -822 ml    Exam:   General:  Pt in NAD, A and O x 3  Cardiovascular: RRR, No MRG  Respiratory: CTA BL, No wheezes  Abdomen: Soft, NT, + BS  Data Reviewed: Basic Metabolic Panel:  Lab [id number] 1130 02/21/12 0550 02/20/12 2107  NA -- 140 137  K -- 3.4* 3.3*  CL -- 106 100  CO2 -- 20 20  GLUCOSE -- 89 86  BUN -- 57* 57*  CREATININE -- 5.36* 5.10*  CALCIUM -- 8.7 9.9  MG -- -- --  PHOS 5.5* -- --   Liver Function Tests:  Lab 02/20/12 2107  AST 14  ALT 7  ALKPHOS 123*  BILITOT 0.2*  PROT 8.5*  ALBUMIN 3.9    Lab 02/20/12 2107  LIPASE 56  AMYLASE --   No results found for this basename: AMMONIA:5 in the last 168 hours CBC:  Lab 02/21/12 0550 02/20/12 2107  WBC 4.1 6.4  NEUTROABS -- 3.0  HGB 9.9* 11.5*  HCT 30.2* 34.3*  MCV 88.8 87.9  PLT 273  311   Cardiac Enzymes: No results found for this basename: CKTOTAL:5,CKMB:5,CKMBINDEX:5,TROPONINI:5 in the last 168 hours BNP (last 3 results) No results found for this basename: PROBNP:3 in the last 8760 hours CBG: No results found for this basename: GLUCAP:5 in the last 168 hours  No results found for this or any previous visit (from the past 240 hour(s)).   Studies: Dg Chest 2 View  02/20/2012  *RADIOLOGY REPORT*  Clinical Data: Chest pain  CHEST - 2 VIEW  Comparison: 05/02/2011  Findings: Upper normal heart size. Bibasilar atelectasis.  Low volumes.  No pneumothorax.  No pleural effusion.  IMPRESSION: Bibasilar atelectasis.  Original Report Authenticated By: Jamas Lav, M.D.    Scheduled Meds:   . sodium chloride    Intravenous Once  . cefTRIAXone (ROCEPHIN)  IV  1 g Intravenous QHS  . darbepoetin (ARANESP) injection - DIALYSIS  60 mcg Intravenous Q Sat-HD  . heparin  5,000 Units Subcutaneous Q8H  . ondansetron  4 mg Intravenous Once  . oxyCODONE-acetaminophen  1 tablet Oral Once  . paricalcitol  5 mcg Intravenous Q T,Th,Sa-HD  . sodium chloride  3 mL Intravenous Q12H  . DISCONTD: sodium chloride   Intravenous STAT  . DISCONTD: cefTRIAXone (ROCEPHIN)  IV  1 g Intravenous Q24H   Continuous Infusions:   Active Problems:  ESRD (end stage renal disease) on dialysis  HTN (hypertension)  Pyelonephritis, acute  Spina bifida    Time spent: > 35 minutes.  Discussing with patient, Nephrology, nurse, medical decision making, documenting, billing    Velvet Bathe  [facility] Pager [phone]. If 8PM-8AM, please contact night-coverage at [email or web address], password [facility] 02/21/2012, 2:37 PM  LOS: 1 day

## 2012-02-21 NOTE — Consult Note (Signed)
[facility] Renal Consultation Note  Indication for Consultation:  Management of ESRD/hemodialysis; anemia, hypertension/volume and secondary hyperparathyroidism  HPI: [name] is a 21 y.o.AA female with ESRD on chronic HD support every TTS at [facility] who recently moved to [place] from [place] where she dialyzed at [facility]. Her last treatment there was Thursday 02/12/12 and she had her first treatment at [facility] on 02/19/12 but was feeling bad and only dialyzed 1hr. She presented to the ED with complaints N/V/D, abdominal pain and fevers as high as 106 at home and 105 on arrival. of note, she reports having diarrhea x 1 wk prior to N/V and fever since last Wed. In light of her Spina Bifida, suspect this contributed to her UTI. She had an negative CXR on adm and been admitted for further evaluation and treatment of recurrent UTI/possible pyelonephritis noting E.Coli UTI in 4/13 and 6/13 . Renal Consult has been requested due to the need for ongoing RRT. Plans for HD today.  Dialysis Orders: Center: [place] on TTS. Dr. Moshe Cipro EDW  HD Bath 2K/2.25Ca+ Time 2:45 Heparin 4,000. Access LUA AVF BFR 400 DFR 600    Zemplar 43mcg IV/HD Epogen 3,000 Units IV/HD  Venofer none  Other Optiflux 160  Past Medical History  Diagnosis Date  . ESRD (end stage renal disease) on dialysis   . Spina bifida   . UTI (lower urinary tract infection)   . HTN (hypertension) 05/02/2011  . Anemia associated with chronic renal failure   . 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  . Blood transfusion   . Dialysis care    Past Surgical History  Procedure Date  . Av fistula placement     left arm   Family History  Problem Relation Age of Onset  . Kidney cancer Other     reports that she has never smoked. She does not have any smokeless tobacco history on file. She reports that she does not drink alcohol or use illicit drugs. Allergies  Allergen Reactions  . Ciprofloxacin Shortness  Of Breath, Nausea And Vomiting and Other (See Comments)    HIGH FEVER  . Other Anaphylaxis    Revaclear dialzer  . Peanut-Containing Drug Products Anaphylaxis  . Influenza Vaccines Nausea And Vomiting    High fever  . Tetanus Toxoids Nausea And Vomiting and Other (See Comments)    HIGH FEVER  . Latex Itching and Rash   Prior to Admission medications   Medication Sig Start Date End Date Taking? Authorizing Provider  acetaminophen (TYLENOL) 325 MG tablet Take 650 mg by mouth daily as needed. For pain   Yes Historical Provider, MD  ibuprofen (ADVIL,MOTRIN) 200 MG tablet Take 600 mg by mouth every 6 (six) hours as needed. For pain   Yes Historical Provider, MD   I have reviewed the patient's current medications. Results for orders placed during the hospital encounter of 02/20/12 (from the past 48 hour(s))  URINALYSIS, ROUTINE W REFLEX MICROSCOPIC     Status: Abnormal   Collection Time   02/20/12  9:00 PM      Component Value Range Comment   Color, Urine YELLOW  YELLOW    APPearance TURBID (*) CLEAR    Specific Gravity, Urine 1.008  1.005 - 1.030    pH 7.5  5.0 - 8.0    Glucose, UA NEGATIVE  NEGATIVE mg/dL    Hgb urine dipstick LARGE (*) NEGATIVE    Bilirubin Urine NEGATIVE  NEGATIVE  Ketones, ur NEGATIVE  NEGATIVE mg/dL    Protein, ur 100 (*) NEGATIVE mg/dL    Urobilinogen, UA 0.2  0.0 - 1.0 mg/dL    Nitrite NEGATIVE  NEGATIVE    Leukocytes, UA LARGE (*) NEGATIVE   URINE MICROSCOPIC-ADD ON     Status: Abnormal   Collection Time   02/20/12  9:00 PM      Component Value Range Comment   Squamous Epithelial / LPF RARE  RARE    WBC, UA TOO NUMEROUS TO COUNT  <3 WBC/hpf    RBC / HPF 0-2  <3 RBC/hpf    Bacteria, UA FEW (*) RARE   CBC WITH DIFFERENTIAL     Status: Abnormal   Collection Time   02/20/12  9:07 PM      Component Value Range Comment   WBC 6.4  4.0 - 10.5 K/uL    RBC 3.90  3.87 - 5.11 MIL/uL    Hemoglobin 11.5 (*) 12.0 - 15.0 g/dL    HCT 34.3 (*) 36.0 - 46.0 %    MCV  87.9  78.0 - 100.0 fL    MCH 29.5  26.0 - 34.0 pg    MCHC 33.5  30.0 - 36.0 g/dL    RDW 13.0  11.5 - 15.5 %    Platelets 311  150 - 400 K/uL    Neutrophils Relative 47  43 - 77 %    Neutro Abs 3.0  1.7 - 7.7 K/uL    Lymphocytes Relative 32  12 - 46 %    Lymphs Abs 2.1  0.7 - 4.0 K/uL    Monocytes Relative 17 (*) 3 - 12 %    Monocytes Absolute 1.1 (*) 0.1 - 1.0 K/uL    Eosinophils Relative 3  0 - 5 %    Eosinophils Absolute 0.2  0.0 - 0.7 K/uL    Basophils Relative 1  0 - 1 %    Basophils Absolute 0.0  0.0 - 0.1 K/uL   COMPREHENSIVE METABOLIC PANEL     Status: Abnormal   Collection Time   02/20/12  9:07 PM      Component Value Range Comment   Sodium 137  135 - 145 mEq/L    Potassium 3.3 (*) 3.5 - 5.1 mEq/L    Chloride 100  96 - 112 mEq/L    CO2 20  19 - 32 mEq/L    Glucose, Bld 86  70 - 99 mg/dL    BUN 57 (*) 6 - 23 mg/dL    Creatinine, Ser 5.10 (*) 0.50 - 1.10 mg/dL    Calcium 9.9  8.4 - 10.5 mg/dL    Total Protein 8.5 (*) 6.0 - 8.3 g/dL    Albumin 3.9  3.5 - 5.2 g/dL    AST 14  0 - 37 U/L    ALT 7  0 - 35 U/L    Alkaline Phosphatase 123 (*) 39 - 117 U/L    Total Bilirubin 0.2 (*) 0.3 - 1.2 mg/dL    GFR calc non Af Amer 11 (*) >90 mL/min    GFR calc Af Amer 13 (*) >90 mL/min   LIPASE, BLOOD     Status: Normal   Collection Time   02/20/12  9:07 PM      Component Value Range Comment   Lipase 56  11 - 59 U/L   LACTIC ACID, PLASMA     Status: Normal   Collection Time   02/20/12  9:35 PM  Component Value Range Comment   Lactic Acid, Venous 1.0  0.5 - 2.2 mmol/L   CBC     Status: Abnormal   Collection Time   02/21/12  5:50 AM      Component Value Range Comment   WBC 4.1  4.0 - 10.5 K/uL    RBC 3.40 (*) 3.87 - 5.11 MIL/uL    Hemoglobin 9.9 (*) 12.0 - 15.0 g/dL    HCT 30.2 (*) 36.0 - 46.0 %    MCV 88.8  78.0 - 100.0 fL    MCH 29.1  26.0 - 34.0 pg    MCHC 32.8  30.0 - 36.0 g/dL    RDW 13.1  11.5 - 15.5 %    Platelets 273  150 - 400 K/uL   BASIC METABOLIC PANEL      Status: Abnormal   Collection Time   02/21/12  5:50 AM      Component Value Range Comment   Sodium 140  135 - 145 mEq/L    Potassium 3.4 (*) 3.5 - 5.1 mEq/L    Chloride 106  96 - 112 mEq/L    CO2 20  19 - 32 mEq/L    Glucose, Bld 89  70 - 99 mg/dL    BUN 57 (*) 6 - 23 mg/dL    Creatinine, Ser 5.36 (*) 0.50 - 1.10 mg/dL    Calcium 8.7  8.4 - 10.5 mg/dL    GFR calc non Af Amer 11 (*) >90 mL/min    GFR calc Af Amer 12 (*) >90 mL/min    ROS: as noted HPI; Otherwise negative   Physical Exam: Filed Vitals:   02/21/12 0449  BP: 95/57  Pulse: 81  Temp: 98.2 F (36.8 C)  Resp: 18     General: pleasant, comfortable, non-toxic, NAD HEENT: atraumatic, normocephalic Eyes: sclera nonicteric Neck: supple, no cervical lymphadenopathy Heart: RRR, normal S1, S2; soft 2/6 SEM Lungs: CTAB, no rhonchi, rales, or wheezes Abdomen: soft, slightly diffuse tenderness, +BS Extremities: no edema Skin: intact Neuro: alert, Ox3; grossly intact as appropriate secondary to spina bifida Dialysis Access: LUA AVF, +T/B   Assessment/Plan: 1. Acute Pyelonephritis- T-Max 105; on IV Rocephin; urine cx and bld cx pending; noting recurrent UTI's, education has been done regarding how to clean front to back and use a spray/squirt bottle to clean her perineum appropriately to help prevent recurrent infections; this needs to be reinforced 2. ESRD - [place]; K level 3.4; HD today on 4K bath 3 Hypertension/volume  - BP chronic low; only 0.4 over eDW; UF goal 0.5L as tol; follow 4. Anemia  - Hgb trending down; 9.9 on adm with previous Hgb 11.3 on 7/11 and 10.3 on 7/18 in which she was recently resumend on 3,000 epogen; suspect missed 2-3 doses epogen with missed/incomplete HD treatments; noting her current hgb level, will ^ ESA and give Aranesp 83mcg IV today; most recent T-sat 72% outpt setting and no IV Fe; follow trend 5. Metabolic bone disease - ca 8.7; same outpt Zemplar 62mcg and identify need for binders with  pre-procedure phos level today 6. Nutrition - albumin 3.9; ^ protein renal diet 7. Spina Bifida- assistance withADL's 8. Homelessness- needs change from OBS to inpt status; sw/case manager involvement ASAP; charge nurse to request consult; not urgent over the weekend but needs to be addressed by Monday 8. Disposition- per primary services; take into consideration # [place], [facility] Pager [phone] 02/21/2012, 9:29 AM   Attending  Nephrologist: Dr. Erling Cruz  [facility] C

## 2012-02-21 NOTE — ED Notes (Signed)
Admitting MD at bedside.

## 2012-02-22 DIAGNOSIS — Z590 Homelessness: Secondary | ICD-10-CM

## 2012-02-22 LAB — BASIC METABOLIC PANEL
BUN: 23 mg/dL (ref 6–23)
CO2: 25 mEq/L (ref 19–32)
Calcium: 9.6 mg/dL (ref 8.4–10.5)
Chloride: 102 mEq/L (ref 96–112)
Creatinine, Ser: 3.43 mg/dL — ABNORMAL HIGH (ref 0.50–1.10)
GFR calc Af Amer: 21 mL/min — ABNORMAL LOW (ref >90)

## 2012-02-22 LAB — CBC
HCT: 30.8 % — ABNORMAL LOW (ref 36.0–46.0)
MCH: 30 pg (ref 26.0–34.0)
MCV: 89.8 fL (ref 78.0–100.0)
Platelets: 276 10*3/uL (ref 150–400)
RDW: 13.1 % (ref 11.5–15.5)
WBC: 5.2 10*3/uL (ref 4.0–10.5)

## 2012-02-22 MED ORDER — RENA-VITE PO TABS
1.0000 | ORAL_TABLET | Freq: Every day | ORAL | Status: DC
  Administered 2012-02-22: 1 via ORAL
  Filled 2012-02-22 (×2): qty 1

## 2012-02-22 MED ORDER — CALCIUM ACETATE 667 MG PO CAPS
667.0000 mg | ORAL_CAPSULE | Freq: Three times a day (TID) | ORAL | Status: DC
  Administered 2012-02-22 – 2012-02-23 (×5): 667 mg via ORAL
  Filled 2012-02-22 (×6): qty 1

## 2012-02-22 NOTE — Progress Notes (Signed)
CSW consulted by RN to meet with patient re: homelessness and dialysis center concerns. CSW met with patient during her dialysis treatment and provided information for [facility], free hot food and the [facility]. Pt stated that she recently moved from [place] and is having difficulty transferring her Medicaid to [facility]. CSW encouraged Pt to go to DSS after d/c to meet with a Medicaid representative. Pt. Stated she is living with her aunt in a hotel due to her aunt losing her home. The patient was open to going to a shelter and visiting the [facility] until more permanent housing has been acquired. CSW provided supportive counseling and encouraged the patient to maintain her dialysis treatments. Patient was appreciative and she was instructed to contact CSW if she has questions prior to d/c. At this time there are no more psychosocial concerns. Please re-consult CSW if needs arise. CSW signing off.  Janean Sark, [place] Worker Contact #: [phone] (weekend)

## 2012-02-22 NOTE — Progress Notes (Signed)
Subjective:  Sleepy this am; no complaints; encouraged to eat her breakfast  Vital signs in last 24 hours: Filed Vitals:   02/21/12 1700 02/21/12 2112 02/22/12 0300 02/22/12 0446  BP: 103/59 112/60  102/56  Pulse: 118 90 91 91  Temp: 98.6 F (37 C) 98.4 F (36.9 C) 97.8 F (36.6 C) 97.8 F (36.6 C)  TempSrc: Oral Oral Oral Oral  Resp: 16 16  16   Height:      Weight:  27.1 kg (59 lb 11.9 oz)    SpO2: 98% 100%  97%   Weight change: 0.259 kg (9.1 oz)  Intake/Output Summary (Last 24 hours) at 02/22/12 0919 Last data filed at 02/21/12 1533  Gross per 24 hour  Intake      0 ml  Output   1350 ml  Net  -1350 ml   Labs: Basic Metabolic Panel:  Lab Q000111Q 0535 02/21/12 1130 02/21/12 0550 02/20/12 2107  NA 139 -- 140 137  K 3.7 -- 3.4* 3.3*  CL 102 -- 106 100  CO2 25 -- 20 20  GLUCOSE 81 -- 89 86  BUN 23 -- 57* 57*  CREATININE 3.43* -- 5.36* 5.10*  CALCIUM 9.6 -- 8.7 9.9  ALB -- -- -- --  PHOS -- 5.5* -- --   Liver Function Tests:  Lab 02/20/12 2107  AST 14  ALT 7  ALKPHOS 123*  BILITOT 0.2*  PROT 8.5*  ALBUMIN 3.9    Lab 02/20/12 2107  LIPASE 56  AMYLASE --   No results found for this basename: AMMONIA:3 in the last 168 hours CBC:  Lab 02/22/12 0535 02/21/12 0550 02/20/12 2107  WBC 5.2 4.1 6.4  NEUTROABS -- -- 3.0  HGB 10.3* 9.9* 11.5*  HCT 30.8* 30.2* 34.3*  MCV 89.8 88.8 87.9  PLT 276 273 311   Cardiac Enzymes: No results found for this basename: CKTOTAL:5,CKMB:5,CKMBINDEX:5,TROPONINI:5 in the last 168 hours CBG: No results found for this basename: GLUCAP:5 in the last 168 hours  Iron Studies: No results found for this basename: IRON,TIBC,TRANSFERRIN,FERRITIN in the last 72 hours Studies/Results: Dg Chest 2 View  02/20/2012  *RADIOLOGY REPORT*  Clinical Data: Chest pain  CHEST - 2 VIEW  Comparison: 05/02/2011  Findings: Upper normal heart size. Bibasilar atelectasis.  Low volumes.  No pneumothorax.  No pleural effusion.  IMPRESSION: Bibasilar  atelectasis.  Original Report Authenticated By: Jamas Lav, M.D.   Medications:      . cefTRIAXone (ROCEPHIN)  IV  1 g Intravenous QHS  . darbepoetin (ARANESP) injection - DIALYSIS  60 mcg Intravenous Q Sat-HD  . heparin  5,000 Units Subcutaneous Q8H  . paricalcitol  5 mcg Intravenous Q T,Th,Sa-HD  . sodium chloride  3 mL Intravenous Q12H    I  have reviewed scheduled and prn medications.  Dialysis Orders: Center: [place] on TTS. Dr. Moshe Cipro  EDW HD Bath 2K/2.25Ca+ Time 2:45 Heparin 4,000. Access LUA AVF BFR 400 DFR 600  Zemplar 17mcg IV/HD Epogen 3,000 Units IV/HD Venofer none  Other Optiflux 160  General: pleasant, comfortable, non-toxic, NAD  Heart: RRR, normal S1, S2; soft 2/6 SEM  Lungs: CTAB, no rhonchi, rales, or wheezes  Abdomen: soft, slightly diffuse tenderness, +BS  Extremities: no edema  Dialysis Access: LUA AVF, +T/B   Dialysis Orders: Center: [place] on TTS. Dr. Moshe Cipro  EDW HD Bath 2K/2.25Ca+ Time 2:45 Heparin 4,000. Access LUA AVF BFR 400 DFR 600  Zemplar 1mcg IV/HD Epogen 3,000 Units IV/HD Venofer none  Other Optiflux 160  Assessment/Plan:  1. Acute Pyelonephritis- remains afebrile on IV Rocephin; urine cx pending; bld cx NTD  2. ESRD - TTS [place]; K level ^3.7 this am with 4K bath HD yesterday; next tx Tu; follow  3 Hypertension/volume - BP chronic low; only 0.4 over eDW; UF goal 0.5L as tol; follow  4. Anemia - Hgb now 10.3; Aranesp 56mcg IV qwk started yesterday;  No IV Fe with most recent T-sat 72% outpt setting;  follow trend  5. Metabolic bone disease - ca 9.3,  Phos 5.5; on Zemplar 59mcg; add Phoslo 1 with meals TIDWC, add renal vitamin; follow   6. Nutrition - albumin 3.9; regular diet on adm secondary to low K; improved; change to ^ protein renal diet  7. Spina Bifida- assistance withADL's  8. Homelessness- living in hotel prior to adm; Theme park manager has seen 8. Disposition- per primary services; take into consideration # [place],  [address] [facility] [place] Pager [phone]  02/22/2012,9:19 AM  LOS: 2 days   E.Coli UTI clinically improving. Alert & pleasant today.  Agree with assessment and plan as articulated above. Emmert Roethler C

## 2012-02-22 NOTE — Progress Notes (Signed)
Clinical Social Work Department BRIEF PSYCHOSOCIAL ASSESSMENT 02/22/2012  Patient:  [name], [name]     Account Number:  [id number]     Admit date:  02/20/2012  Clinical Social Worker: Janean Sark, Latanya Presser  ,  Date/Time:  02/21/2012 12:35 PM  Referred by:  RN  Date Referred:  02/21/2012 Referred for  Homelessness   Other Referral:   Interview type:  Patient Other interview type:    PSYCHOSOCIAL DATA Living Status:  FAMILY Admitted from facility:   Level of care:   Primary support name:  Leanne Chang Primary support relationship to patient:  FAMILY Degree of support available:   (aunt) Strong and vested.    CURRENT CONCERNS Current Concerns  Other - See comment   Other Concerns:   Medicaid Transfer, Shelters, [place], and IRC contact information.    SOCIAL WORK ASSESSMENT / PLAN CSW met with patient [id number] surrounding homelessness. The CSW provided the patient with community resources on shelters, hot food, and the [facility]. The CSW provided supportive counseling on these issues, and encouraged the patient to be a self-advocate. Pt. stated that she is having difficulty getting a license, and transferring her Medicaid. CSW encouraged the patient to go to DSS to meet with a Medeicaid Represenative. CSW assessed the patient's safety for discharge, and the patient is currently safe, and not homeless. The patient recently started to live in a hotel with her Aunt who lost her housing. The patient is receptive to going to the [facility] to acquire more permanent housing. CSW also discussed the importance of going to dialysis as scheduled. Patient agreed and stated she will go regularly because she did not want to be readmitted.   Assessment/plan status:  Information/Referral to [employer] Other assessment/ plan:   Information/referral to community resources:   [facility] List  DSS [id number]    PATIENT'S/FAMILY'S RESPONSE TO PLAN OF  CARE: Patient was appreciative, and aware of the discussed plan to go to DSS after d/c and to contact the other community resources.   Janean Sark, [place] Worker Contact #: [phone] (weekend)

## 2012-02-22 NOTE — Progress Notes (Signed)
[facility] PROGRESS NOTE  [name] [id number] DOB: 1991/05/12 DOA: 02/20/2012 PCP: No primary provider on file.  Assessment/Plan: Active Problems:  ESRD (end stage renal disease) on dialysis  HTN (hypertension)  Pyelonephritis, acute  Hypokalemia  Spina bifida  Nausea & vomiting  1. Pyelonephritis - Patient initial U/A showed Turbid urine, with large leukocytes and hgb, as well as few bacteria.  - Blood cultures with no growth today, Urine culture grew back > 100,000 CFU of E coli - WBC within normal limit but decreased on recheck. Patient is afebrile.  - Continue Rocephin IV and supportive therapy and taylor antibiotic regimen once susceptibilities return.   2. Nausea and vomiting  - At this point most likely secondary to Number one.  - Resolving with IV antibiotics  - Continue antiemetics PRN  3. ESRD  - Per nephrology. Have discussed case with their care team today.  - changed patient status to inpatient.   4. HTN  - Stable patient is not on any antihypertensive medication at home.  - Will monitor blood pressures   5 Hypokalemia:  - Resolved  6. Homelessness - Placed social work consult for help with disposition - Placed care manager consult for help with medications once patient transitions out of the hospital.  Code Status: Full  Family Communication: No family at bedside  Disposition Plan: Pending clinical improvement. 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 Has been on HD and presented to the ED with fevers, nausea, and emesis. U/A showed dirty urine and patient was admitted and is being treated for pyelonephritis. Urine cultures have grown out E. Coli > 100,000 CFU  Consultants:  Nephrology   Procedures:  HD Antibiotics:  Rocephin    HPI/Subjective: Pt mentions that she feels better today.  Her nausea and emesis is resolving.   No acute issues overnight reported  Denies any worsening abdominal discomfort, BRBPR, Abdominal discomfort, fever, or chills.  Objective: Filed Vitals:   02/22/12 0300 02/22/12 0446 02/22/12 1026 02/22/12 1423  BP:  102/56 103/65 100/55  Pulse: 91 91 99 86  Temp: 97.8 F (36.6 C) 97.8 F (36.6 C) 98.6 F (37 C) 98.2 F (36.8 C)  TempSrc: Oral Oral Oral Oral  Resp:  16 18 20   Height:      Weight:      SpO2:  97% 100% 99%    Intake/Output Summary (Last 24 hours) at 02/22/12 1519 Last data filed at 02/22/12 1300  Gross per 24 hour  Intake    240 ml  Output    650 ml  Net   -410 ml    Exam:  General: Pt in NAD, A and O x 3  Cardiovascular: RRR, No MRG  Respiratory: CTA BL, No wheezes  Abdomen: Soft, NT, + BS   Data Reviewed: Basic Metabolic Panel:  Lab Q000111Q 0535 02/21/12 1130 02/21/12 0550 02/20/12 2107  NA 139 -- 140 137  K 3.7 -- 3.4* 3.3*  CL 102 -- 106 100  CO2 25 -- 20 20  GLUCOSE 81 -- 89 86  BUN 23 -- 57* 57*  CREATININE 3.43* -- 5.36* 5.10*  CALCIUM 9.6 -- 8.7 9.9  MG -- -- -- --  PHOS -- 5.5* -- --   Liver Function Tests:  Lab 02/20/12 2107  AST 14  ALT 7  ALKPHOS 123*  BILITOT 0.2*  PROT 8.5*  ALBUMIN 3.9    Lab 02/20/12 2107  LIPASE 56  AMYLASE --   No results found for this basename: AMMONIA:5 in the last 168 hours CBC:  Lab 02/22/12 0535 02/21/12 0550 02/20/12 2107  WBC 5.2 4.1 6.4  NEUTROABS -- -- 3.0  HGB 10.3* 9.9* 11.5*  HCT 30.8* 30.2* 34.3*  MCV 89.8 88.8 87.9  PLT 276 273 311   Cardiac Enzymes: No results found for this basename: CKTOTAL:5,CKMB:5,CKMBINDEX:5,TROPONINI:5 in the last 168 hours BNP (last 3 results) No results found for this basename: PROBNP:3 in the last 8760 hours CBG: No results found for this basename: GLUCAP:5 in the last 168 hours  Recent Results (from the past 240 hour(s))  URINE CULTURE     Status: Normal (Preliminary result)   Collection Time   02/20/12  9:00 PM      Component Value Range  Status Comment   Specimen Description URINE, CATHETERIZED   Final    Special Requests NONE   Final    Culture  Setup Time 02/21/2012 05:03   Final    Colony Count >=100,000 COLONIES/ML   Final    Culture ESCHERICHIA COLI   Final    Report Status PENDING   Incomplete   CULTURE, BLOOD (ROUTINE X 2)     Status: Normal (Preliminary result)   Collection Time   02/20/12  9:00 PM      Component Value Range Status Comment   Specimen Description BLOOD ARM RIGHT   Final    Special Requests BOTTLES DRAWN AEROBIC AND ANAEROBIC 10CC   Final    Culture  Setup Time 02/21/2012 01:22   Final    Culture     Final    Value:        BLOOD CULTURE RECEIVED NO GROWTH TO DATE CULTURE WILL BE HELD FOR 5 DAYS BEFORE ISSUING A FINAL NEGATIVE REPORT   Report Status PENDING   Incomplete   CULTURE, BLOOD (ROUTINE X 2)     Status: Normal (Preliminary result)   Collection Time   02/20/12  9:20 PM      Component Value Range Status Comment   Specimen Description BLOOD HAND RIGHT   Final    Special Requests BOTTLES DRAWN AEROBIC AND ANAEROBIC 10CC   Final    Culture  Setup Time 02/21/2012 01:23   Final    Culture     Final    Value:        BLOOD CULTURE RECEIVED NO GROWTH TO DATE CULTURE WILL BE HELD FOR 5 DAYS BEFORE ISSUING A FINAL NEGATIVE REPORT   Report Status PENDING   Incomplete      Studies: Dg Chest 2 View  02/20/2012  *RADIOLOGY REPORT*  Clinical Data: Chest pain  CHEST - 2 VIEW  Comparison: 05/02/2011  Findings: Upper normal heart size. Bibasilar atelectasis.  Low volumes.  No pneumothorax.  No pleural effusion.  IMPRESSION: Bibasilar atelectasis.  Original Report Authenticated By: Jamas Lav, M.D.    Scheduled Meds:   . calcium acetate  667 mg Oral TID WC  . cefTRIAXone (ROCEPHIN)  IV  1 g Intravenous QHS  . darbepoetin (ARANESP) injection - DIALYSIS  60 mcg Intravenous Q Sat-HD  . heparin  5,000 Units Subcutaneous Q8H  . multivitamin  1 tablet Oral QHS  . paricalcitol  5 mcg Intravenous Q  T,Th,Sa-HD  . sodium chloride  3 mL Intravenous Q12H   Continuous Infusions:   Active Problems:  ESRD (end stage renal disease) on dialysis  HTN (  hypertension)  Pyelonephritis, acute  Hypokalemia  Spina bifida  Nausea & vomiting    Time spent: > 30 minutes    Velvet Bathe  [facility] Pager [phone]. If 8PM-8AM, please contact night-coverage at [email or web address], password [facility] 02/22/2012, 3:19 PM  LOS: 2 days

## 2012-02-23 LAB — URINE CULTURE

## 2012-02-23 MED ORDER — ONDANSETRON HCL 4 MG PO TABS: 4.0000 mg | ORAL_TABLET | Freq: Four times a day (QID) | ORAL | Status: AC | PRN

## 2012-02-23 MED ORDER — CEFAZOLIN SODIUM 1-5 GM-% IV SOLN: 1.0000 g | INTRAVENOUS | Status: DC | PRN

## 2012-02-23 MED ORDER — RENA-VITE PO TABS: 1.0000 | ORAL_TABLET | Freq: Every day | ORAL | Status: DC

## 2012-02-23 MED ORDER — CEFAZOLIN SODIUM 1-5 GM-% IV SOLN: 1.0000 g | INTRAVENOUS | Status: DC

## 2012-02-23 MED ORDER — CALCIUM ACETATE 667 MG PO CAPS: 667.0000 mg | ORAL_CAPSULE | Freq: Three times a day (TID) | ORAL | Status: DC

## 2012-02-23 MED ORDER — HEPARIN SODIUM (PORCINE) 1000 UNIT/ML DIALYSIS
20.0000 [IU]/kg | INTRAMUSCULAR | Status: DC | PRN
  Filled 2012-02-23: qty 1

## 2012-02-23 NOTE — Clinical Social Work Note (Signed)
CSW talked with patient at length about her living situation in [place] and in [place], her Medicaid and SSI benefits, and her payee situation. Patient has only talked with aunt who lives in [place] (payee) once since leaving [place]. Patient encouraged to get to [place] if possible to talk with aunt regarding her August check and her needs. CSW talked with patient about contacting [place] regarding her move and patient reported that she had and was advised that her aunt would need to call and verify her change of address. Patient wants a new payee and we discussed her relatives in [place] and all of them appear to have financial and housing issues of their own. CSW talked with patient about going to the Dept of Social Services to seek payee services. CSW also called an adult services social worker to seek guidance on how patient should proceed, and DSS SW suggested that patient come in and talk with them about her situation and needing a new payee. Patient, her aunt, and two cousins will be staying at the [facility], she has dialysis Tuesday and will go to DSS on Wednesday. Patient advised that she can apply for Medicaid transportation at [place] on Wednesday. Patient given CSW's card.  Ms. Camarda given 2 bus passes to get to and from [place] on Wednesday. Family came to pick patient up from hospital.   Karilynn Carranza Givens, MSW, LCSW [phone]

## 2012-02-23 NOTE — Discharge Summary (Addendum)
Physician Discharge Summary  [name] [id number] DOB: [birth year] DOA: 02/20/2012  PCP: No primary provider on file.  Admit date: 02/20/2012 Discharge date: 02/23/2012  Recommendations for Outpatient Follow-up:  1. Patient has received 3 days of antibiotic therapy while here.  I have recommended that the patient get 10 total days of antibiotic therapy.  She is to get her antibiotic therapy during dialysis.  I have discussed with the Nephrologist PA.  Patient is aware and she understands that she her antibiotic regimen will be provided during dialysis.  She assures me that she will follow up.  Discharge Diagnoses:  Principal Problem:  *Pyelonephritis, acute Active Problems:  Hypokalemia  ESRD (end stage renal disease) on dialysis  HTN (hypertension)  Spina bifida  Nausea & vomiting  Homeless   Discharge Condition: Stable  Diet recommendation: Renal diet  Wt Readings from Last 3 Encounters:  02/22/12 27.533 kg (60 lb 11.2 oz) (0.00%*)  01/20/12 29.9 kg (65 lb 14.7 oz) (0.00%*)  11/21/11 26.309 kg (58 lb) (0.00%*)   * Growth percentiles are based on CDC 2-20 Years data.    History of present illness:  From original HPI: [name] is an 21 y.o. female with hx of ESRD on HD, HTN, spina bifida, anemia, recurrent UTI, presents to the ER complaining of abdominal pain and fever at home up to 105. She denied SOB or chest pain. No reported diarrhea, black or bloody stool. She has gotten dialysis in [place], and [place], but is changing her dialysis center to [place]. She is still making urine. Evaluation in the ER included a normal WBC, normal lipase, unremarkable Hb, Cr of 5.10, and K of 3.3. Her CXR only showed atelectasis, and her UA is positive for UTI. Hospitalist was asked to admit her for UTI.   Hospital Course:  Patient improved on Rocephin.  Had diarrhea prior to developing a UTI and urine culture grew back E. Coli which was pan sensitive.  Discussed with  nephrology care team and patient is to have ancef administered during dialysis.  Currently patient denies any abdominal discomfort, nausea, or emesis.  Procedures:  HD  Consultations:  Nephrology  Discharge Exam: Filed Vitals:   02/23/12 1000  BP: 108/56  Pulse: 90  Temp: 98.2 F (36.8 C)  Resp: 18   Filed Vitals:   02/22/12 1900 02/22/12 2134 02/23/12 0500 02/23/12 1000  BP:  111/62 94/52 108/56  Pulse:  92 92 90  Temp:  98 F (36.7 C) 97.6 F (36.4 C) 98.2 F (36.8 C)  TempSrc:  Oral Oral Oral  Resp:  18 18 18   Height:      Weight: 27.533 kg (60 lb 11.2 oz)     SpO2:  100% 100% 100%    General: Pt in NAD, A and O x 3 Cardiovascular: RRR, No MRG Respiratory: Clear to auscultation, no increased work of breathing.  Discharge Instructions  Discharge Orders    Future Orders Please Complete By Expires   Diet - low sodium heart healthy      Increase activity slowly      Call MD for:  temperature >100.4      Call MD for:  persistant nausea and vomiting      Discharge instructions      Comments:   You are to have ancef administered during dialysis for treatment of your UTI.  If you have any questions please discuss with you nephrologist.     Medication List  As of 02/23/2012  2:30 PM   STOP taking these medications         ibuprofen 200 MG tablet         TAKE these medications         acetaminophen 325 MG tablet   Commonly known as: TYLENOL   Take 650 mg by mouth daily as needed. For pain      calcium acetate 667 MG capsule   Commonly known as: PHOSLO   Take 1 capsule (667 mg total) by mouth 3 (three) times daily with meals.      ceFAZolin 1-5 GM-%   Commonly known as: ANCEF   Inject 50 mLs (1 g total) into the vein every hemodialysis (For treatment of UTI to complete a total course of 10 days).      multivitamin Tabs tablet   Take 1 tablet by mouth at bedtime.      ondansetron 4 MG tablet   Commonly known as: ZOFRAN   Take 1 tablet (4 mg total) by  mouth every 6 (six) hours as needed for nausea.              The results of significant diagnostics from this hospitalization (including imaging, microbiology, ancillary and laboratory) are listed below for reference.    Significant Diagnostic Studies: Dg Chest 2 View  02/20/2012  *RADIOLOGY REPORT*  Clinical Data: Chest pain  CHEST - 2 VIEW  Comparison: 05/02/2011  Findings: Upper normal heart size. Bibasilar atelectasis.  Low volumes.  No pneumothorax.  No pleural effusion.  IMPRESSION: Bibasilar atelectasis.  Original Report Authenticated By: Jamas Lav, M.D.    Microbiology: Recent Results (from the past 240 hour(s))  URINE CULTURE     Status: Normal   Collection Time   02/20/12  9:00 PM      Component Value Range Status Comment   Specimen Description URINE, CATHETERIZED   Final    Special Requests NONE   Final    Culture  Setup Time 02/21/2012 05:03   Final    Colony Count >=100,000 COLONIES/ML   Final    Culture ESCHERICHIA COLI   Final    Report Status 02/23/2012 FINAL   Final    Organism ID, Bacteria ESCHERICHIA COLI   Final   CULTURE, BLOOD (ROUTINE X 2)     Status: Normal (Preliminary result)   Collection Time   02/20/12  9:00 PM      Component Value Range Status Comment   Specimen Description BLOOD ARM RIGHT   Final    Special Requests BOTTLES DRAWN AEROBIC AND ANAEROBIC 10CC   Final    Culture  Setup Time 02/21/2012 01:22   Final    Culture     Final    Value:        BLOOD CULTURE RECEIVED NO GROWTH TO DATE CULTURE WILL BE HELD FOR 5 DAYS BEFORE ISSUING A FINAL NEGATIVE REPORT   Report Status PENDING   Incomplete   CULTURE, BLOOD (ROUTINE X 2)     Status: Normal (Preliminary result)   Collection Time   02/20/12  9:20 PM      Component Value Range Status Comment   Specimen Description BLOOD HAND RIGHT   Final    Special Requests BOTTLES DRAWN AEROBIC AND ANAEROBIC 10CC   Final    Culture  Setup Time 02/21/2012 01:23   Final    Culture     Final    Value:         BLOOD CULTURE RECEIVED NO GROWTH  TO DATE CULTURE WILL BE HELD FOR 5 DAYS BEFORE ISSUING A FINAL NEGATIVE REPORT   Report Status PENDING   Incomplete      Labs: Basic Metabolic Panel:  Lab Q000111Q 0535 02/21/12 1130 02/21/12 0550 02/20/12 2107  NA 139 -- 140 137  K 3.7 -- 3.4* 3.3*  CL 102 -- 106 100  CO2 25 -- 20 20  GLUCOSE 81 -- 89 86  BUN 23 -- 57* 57*  CREATININE 3.43* -- 5.36* 5.10*  CALCIUM 9.6 -- 8.7 9.9  MG -- -- -- --  PHOS -- 5.5* -- --   Liver Function Tests:  Lab 02/20/12 2107  AST 14  ALT 7  ALKPHOS 123*  BILITOT 0.2*  PROT 8.5*  ALBUMIN 3.9    Lab 02/20/12 2107  LIPASE 56  AMYLASE --   No results found for this basename: AMMONIA:5 in the last 168 hours CBC:  Lab 02/22/12 0535 02/21/12 0550 02/20/12 2107  WBC 5.2 4.1 6.4  NEUTROABS -- -- 3.0  HGB 10.3* 9.9* 11.5*  HCT 30.8* 30.2* 34.3*  MCV 89.8 88.8 87.9  PLT 276 273 311   Cardiac Enzymes: No results found for this basename: CKTOTAL:5,CKMB:5,CKMBINDEX:5,TROPONINI:5 in the last 168 hours BNP: BNP (last 3 results) No results found for this basename: PROBNP:3 in the last 8760 hours CBG: No results found for this basename: GLUCAP:5 in the last 168 hours  Time coordinating discharge:> 35 minutes  Signed:  Velvet Bathe  [facility] 02/23/2012, 2:30 PM   Addendum:  Have discussed case with Care manager and Social worker.  Patient is to follow up with Social services of which she has been given bus passes in order to help her set up the care she needs moving forward regarding meals and help with living situation.   Patient is aware and assures [place] that she will follow up with social services on Wednesday.  Care manager will provide list of primary care providers.

## 2012-02-23 NOTE — Discharge Planning (Signed)
Patient discharged home in stable condition. Verbalizes understanding of all discharge instructions, including home medications and follow up appointments. 

## 2012-02-23 NOTE — Clinical Documentation Improvement (Signed)
DOCUMENTATION CLARIFICATION QUERY  THIS DOCUMENT IS NOT A PERMANENT PART OF THE MEDICAL RECORD  TO RESPOND TO THE THIS QUERY, FOLLOW THE INSTRUCTIONS BELOW:  1. If needed, update documentation for the patient's encounter via the notes activity.  2. Access this query again and click edit on the In [employer].  3. After updating, or not, click F2 to complete all highlighted (required) fields concerning your review. Select "additional documentation in the medical record" OR "no additional documentation provided".  4. Click Sign note button.  5. The deficiency will fall out of your In Basket *Please let us know if you are not able to complete this workflow by phone or e-mail (listed below).  Please update your documentation within the medical record to reflect your response to this query.                                                                                        02/23/12   Dear Dr. Wendee Beavers / Associates,  In a better effort to capture your patient's severity of illness, reflect appropriate length of stay and utilization of resources, a review of the patient medical record has revealed the following indicators.    Based on your clinical judgment, please clarify and document in a progress note and/or discharge summary the clinical condition associated with the following supporting information:  In responding to this query please exercise your independent judgment.  The fact that a query is asked, does not imply that any particular answer is desired or expected.  Possible Clinical Conditions?  Paraplegia  Functional Quadraplegia  Other Condition  Cannot Clinically Determine    Supporting Information:  Risk Factors:(As per notes) "Spina bifida"  Signs & Symptoms:(As per notes) "very atrophic lower extremities from her spinal bifida",  "assistance withADL's"    You may use possible, probable, or suspect with inpatient documentation. possible, probable, suspected  diagnoses MUST be documented at the time of discharge  Reviewed:NOT ANSWERED  Thank You,  Wallie Char Delk RN, BSN Clinical Documentation Specialist: [phone] Pager [place]

## 2012-02-23 NOTE — Progress Notes (Signed)
[facility] Progress Note  Subjective:  Tells me her aunt is planning to pawn some of her stuff to get a hotel room.  Stayed with pt today.  Aunt also has three children 11, 17 and 18 who stayed elsewhere last night. Food is nasty. Doesn't eat breakfast. Wants to go home.   Objective Filed Vitals:   02/22/12 1828 02/22/12 1900 02/22/12 2134 02/23/12 0500  BP: 91/63  111/62 94/52  Pulse: 107  92 92  Temp: 98.3 F (36.8 C)  98 F (36.7 C) 97.6 F (36.4 C)  TempSrc: Oral  Oral Oral  Resp: 18  18 18   Height:      Weight:  27.533 kg (60 lb 11.2 oz)    SpO2: 100%  100% 100%   Physical Exam  General:stting in bed  Heart: RRR Lungs: no wheezes or rales Abdomen: soft Extremities: no edema Dialysis Access: left upper AVF patent  Dialysis Orders: Center: [place] on TTS. Dr. Moshe Cipro  EDW HD [place] 2K/2.25Ca+ Time 2:45 Heparin 4,000. Access LUA AVF BFR 400 DFR 600  Zemplar 64mcg IV/HD Epogen 3,000 Units IV/HD Venofer none  Other Optiflux 160  Assessment/Plan: 1. Acute Pyelonephritis- remains afebrile on IV Rocephin; urine cx pending; bld cx NTD (EColi in June) 2. ESRD - TTS [place]; HD Tuesday; needs consistent address to get [place] services, and says she need Medicaid also to pay for the transportation [place]. Discussed with social worker who will advise her in this regard. Use tight heparin due to SZ dose; outpt heparin dose is probably too high given weight. 3. Hypertension/volume - BP chronic low; UF 500 Saturday 4. Anemia - Hgb now 10.3; Aranesp 20mcg IV qwk started yesterday; No IV Fe with most recent T-sat 72% outpt setting; follow trend  5. Metabolic bone disease - ca 9.3, Phos 5.5; on Zemplar 63mcg; add Phoslo 1 with meals TIDWC, add renal vitamin; follow  6. Nutrition - albumin 3.9; regular diet on adm secondary to low K; improved; change to ^ protein renal diet; intake poor; given size, overall caloric intake is likely low.  7. Spina Bifida- assistance withADL's   8. Homelessness- living in hotel prior to adm; Theme park manager has seen; pt needs to go to DSS to change Medicaid, but that is easier said than done due to mobility and transportation issues. 9. Disposition- per primary services. Discussed w Dr. Wendee Beavers, [place] for discharge from renal standpoint.   Myriam Jacobson, PA-C [place]  02/23/2012,9:31 AM  LOS: 3 days   Patient seen and examined and agree with assessment and plan as above with additions as indicated.  Kelly Splinter  MD [place] [phone] pgr    [phone] cell 02/23/2012, 12:20 PM        Additional Objective Labs: Basic Metabolic Panel:  Lab [id number] 0535 02/21/12 1130 02/21/12 0550 02/20/12 2107  NA 139 -- 140 137  K 3.7 -- 3.4* 3.3*  CL 102 -- 106 100  CO2 25 -- 20 20  GLUCOSE 81 -- 89 86  BUN 23 -- 57* 57*  CREATININE 3.43* -- 5.36* 5.10*  CALCIUM 9.6 -- 8.7 9.9  ALB -- -- -- --  PHOS -- 5.5* -- --   Liver Function Tests:  Lab 02/20/12 2107  AST 14  ALT 7  ALKPHOS 123*  BILITOT 0.2*  PROT 8.5*  ALBUMIN 3.9    Lab 02/20/12 2107  LIPASE 56  AMYLASE --  CBC:  Lab 02/22/12 0535 02/21/12 0550 02/20/12  2107  WBC 5.2 4.1 6.4  NEUTROABS -- -- 3.0  HGB 10.3* 9.9* 11.5*  HCT 30.8* 30.2* 34.3*  MCV 89.8 88.8 87.9  PLT 276 273 311   Blood Culture    Component Value Date/Time   SDES BLOOD HAND RIGHT 02/20/2012 2120   SPECREQUEST BOTTLES DRAWN AEROBIC AND ANAEROBIC 10CC 02/20/2012 2120   CULT        BLOOD CULTURE RECEIVED NO GROWTH TO DATE CULTURE WILL BE HELD FOR 5 DAYS BEFORE ISSUING A FINAL NEGATIVE REPORT 02/20/2012 2120   REPTSTATUS PENDING 02/20/2012 2120   Medications:      . calcium acetate  667 mg Oral TID WC  . cefTRIAXone (ROCEPHIN)  IV  1 g Intravenous QHS  . darbepoetin (ARANESP) injection - DIALYSIS  60 mcg Intravenous Q Sat-HD  . heparin  5,000 Units Subcutaneous Q8H  . multivitamin  1 tablet Oral QHS  . paricalcitol  5 mcg  Intravenous Q T,Th,Sa-HD  . sodium chloride  3 mL Intravenous Q12H

## 2012-02-27 LAB — CULTURE, BLOOD (ROUTINE X 2)

## 2012-03-29 ENCOUNTER — Inpatient Hospital Stay (HOSPITAL_COMMUNITY)
Admission: EM | Admit: 2012-03-29 | Discharge: 2012-03-31 | DRG: 689 | Disposition: A | Discharge status: Home / Self Care | Payer: Medicare Other | Attending: Internal Medicine | Admitting: Internal Medicine

## 2012-03-29 ENCOUNTER — Encounter (HOSPITAL_COMMUNITY): Payer: Self-pay | Admitting: *Deleted

## 2012-03-29 ENCOUNTER — Emergency Department (HOSPITAL_COMMUNITY): Payer: Medicare Other

## 2012-03-29 DIAGNOSIS — Z881 Allergy status to other antibiotic agents status: Secondary | ICD-10-CM

## 2012-03-29 DIAGNOSIS — E872 Acidosis, unspecified: Secondary | ICD-10-CM

## 2012-03-29 DIAGNOSIS — E876 Hypokalemia: Secondary | ICD-10-CM

## 2012-03-29 DIAGNOSIS — A498 Other bacterial infections of unspecified site: Secondary | ICD-10-CM | POA: Diagnosis present

## 2012-03-29 DIAGNOSIS — R197 Diarrhea, unspecified: Secondary | ICD-10-CM

## 2012-03-29 DIAGNOSIS — D631 Anemia in chronic kidney disease: Secondary | ICD-10-CM | POA: Diagnosis present

## 2012-03-29 DIAGNOSIS — N186 End stage renal disease: Secondary | ICD-10-CM

## 2012-03-29 DIAGNOSIS — Z886 Allergy status to analgesic agent status: Secondary | ICD-10-CM

## 2012-03-29 DIAGNOSIS — Z992 Dependence on renal dialysis: Secondary | ICD-10-CM

## 2012-03-29 DIAGNOSIS — R109 Unspecified abdominal pain: Secondary | ICD-10-CM

## 2012-03-29 DIAGNOSIS — I120 Hypertensive chronic kidney disease with stage 5 chronic kidney disease or end stage renal disease: Secondary | ICD-10-CM | POA: Diagnosis present

## 2012-03-29 DIAGNOSIS — D649 Anemia, unspecified: Secondary | ICD-10-CM

## 2012-03-29 DIAGNOSIS — N10 Acute tubulo-interstitial nephritis: Secondary | ICD-10-CM

## 2012-03-29 DIAGNOSIS — R000 Tachycardia, unspecified: Secondary | ICD-10-CM

## 2012-03-29 DIAGNOSIS — Z887 Allergy status to serum and vaccine status: Secondary | ICD-10-CM

## 2012-03-29 DIAGNOSIS — Z91010 Allergy to peanuts: Secondary | ICD-10-CM

## 2012-03-29 DIAGNOSIS — Z87440 Personal history of urinary (tract) infections: Secondary | ICD-10-CM

## 2012-03-29 DIAGNOSIS — I10 Essential (primary) hypertension: Secondary | ICD-10-CM

## 2012-03-29 DIAGNOSIS — Z888 Allergy status to other drugs, medicaments and biological substances status: Secondary | ICD-10-CM

## 2012-03-29 DIAGNOSIS — K5900 Constipation, unspecified: Secondary | ICD-10-CM

## 2012-03-29 DIAGNOSIS — Z91040 Latex allergy status: Secondary | ICD-10-CM

## 2012-03-29 DIAGNOSIS — N12 Tubulo-interstitial nephritis, not specified as acute or chronic: Secondary | ICD-10-CM

## 2012-03-29 DIAGNOSIS — E871 Hypo-osmolality and hyponatremia: Secondary | ICD-10-CM

## 2012-03-29 DIAGNOSIS — Q059 Spina bifida, unspecified: Secondary | ICD-10-CM

## 2012-03-29 DIAGNOSIS — Z8051 Family history of malignant neoplasm of kidney: Secondary | ICD-10-CM

## 2012-03-29 DIAGNOSIS — R112 Nausea with vomiting, unspecified: Secondary | ICD-10-CM

## 2012-03-29 DIAGNOSIS — J40 Bronchitis, not specified as acute or chronic: Secondary | ICD-10-CM

## 2012-03-29 DIAGNOSIS — Q7649 Other congenital malformations of spine, not associated with scoliosis: Secondary | ICD-10-CM

## 2012-03-29 DIAGNOSIS — Z5900 Homelessness unspecified: Secondary | ICD-10-CM

## 2012-03-29 DIAGNOSIS — N390 Urinary tract infection, site not specified: Principal | ICD-10-CM

## 2012-03-29 LAB — URINE MICROSCOPIC-ADD ON

## 2012-03-29 LAB — COMPREHENSIVE METABOLIC PANEL
ALT: 6 U/L (ref 0–35)
Alkaline Phosphatase: 94 U/L (ref 39–117)
BUN: 77 mg/dL — ABNORMAL HIGH (ref 6–23)
CO2: 20 mEq/L (ref 19–32)
Chloride: 98 mEq/L (ref 96–112)
GFR calc Af Amer: 9 mL/min — ABNORMAL LOW (ref >90)
GFR calc non Af Amer: 7 mL/min — ABNORMAL LOW (ref >90)
Glucose, Bld: 83 mg/dL (ref 70–99)
Potassium: 4.7 mEq/L (ref 3.5–5.1)
Sodium: 132 mEq/L — ABNORMAL LOW (ref 135–145)
Total Bilirubin: 0.3 mg/dL (ref 0.3–1.2)
Total Protein: 8.5 g/dL — ABNORMAL HIGH (ref 6.0–8.3)

## 2012-03-29 LAB — CBC WITH DIFFERENTIAL/PLATELET
Hemoglobin: 12.8 g/dL (ref 12.0–15.0)
Lymphocytes Relative: 44 % (ref 12–46)
Lymphs Abs: 1.8 10*3/uL (ref 0.7–4.0)
Monocytes Relative: 10 % (ref 3–12)
Neutro Abs: 1.7 10*3/uL (ref 1.7–7.7)
Neutrophils Relative %: 40 % — ABNORMAL LOW (ref 43–77)
Platelets: 215 10*3/uL (ref 150–400)
RBC: 4.28 MIL/uL (ref 3.87–5.11)
WBC: 4.2 10*3/uL (ref 4.0–10.5)

## 2012-03-29 LAB — URINALYSIS, ROUTINE W REFLEX MICROSCOPIC
Bilirubin Urine: NEGATIVE
Nitrite: NEGATIVE
Protein, ur: 100 mg/dL — AB
Specific Gravity, Urine: 1.01 (ref 1.005–1.030)
Urobilinogen, UA: 0.2 mg/dL (ref 0.0–1.0)

## 2012-03-29 LAB — MRSA PCR SCREENING: MRSA by PCR: NEGATIVE

## 2012-03-29 MED ORDER — DEXTROSE 5 % IV SOLN
1.0000 g | Freq: Once | INTRAVENOUS | Status: AC
  Administered 2012-03-29: 1 g via INTRAVENOUS
  Filled 2012-03-29: qty 10

## 2012-03-29 MED ORDER — DIPHENHYDRAMINE HCL 50 MG/ML IJ SOLN
25.0000 mg | Freq: Once | INTRAMUSCULAR | Status: AC
  Administered 2012-03-29: 25 mg via INTRAVENOUS
  Filled 2012-03-29: qty 1

## 2012-03-29 MED ORDER — SODIUM CHLORIDE 0.9 % IV SOLN
INTRAVENOUS | Status: AC
  Administered 2012-03-29: 15:00:00 via INTRAVENOUS

## 2012-03-29 MED ORDER — MORPHINE SULFATE 4 MG/ML IJ SOLN
4.0000 mg | Freq: Once | INTRAMUSCULAR | Status: AC
  Administered 2012-03-29: 4 mg via INTRAVENOUS
  Filled 2012-03-29: qty 1

## 2012-03-29 MED ORDER — ONDANSETRON HCL 4 MG PO TABS
4.0000 mg | ORAL_TABLET | Freq: Four times a day (QID) | ORAL | Status: DC | PRN
  Administered 2012-03-30 – 2012-03-31 (×2): 4 mg via ORAL
  Filled 2012-03-29 (×2): qty 1

## 2012-03-29 MED ORDER — ACETAMINOPHEN 325 MG PO TABS
650.0000 mg | ORAL_TABLET | Freq: Four times a day (QID) | ORAL | Status: DC | PRN
  Administered 2012-03-30 (×2): 650 mg via ORAL
  Filled 2012-03-29 (×2): qty 2

## 2012-03-29 MED ORDER — ACETAMINOPHEN 650 MG RE SUPP: 650.0000 mg | Freq: Four times a day (QID) | RECTAL | Status: DC | PRN

## 2012-03-29 MED ORDER — ONDANSETRON HCL 4 MG/2ML IJ SOLN: 4.0000 mg | Freq: Three times a day (TID) | INTRAMUSCULAR | Status: AC | PRN

## 2012-03-29 MED ORDER — DEXTROSE 5 % IV SOLN
1.0000 g | INTRAVENOUS | Status: DC
  Administered 2012-03-30: 1 g via INTRAVENOUS
  Filled 2012-03-29 (×2): qty 10

## 2012-03-29 MED ORDER — SODIUM CHLORIDE 0.9 % IV BOLUS (SEPSIS)
1000.0000 mL | Freq: Once | INTRAVENOUS | Status: AC
  Administered 2012-03-29: 1000 mL via INTRAVENOUS

## 2012-03-29 MED ORDER — ONDANSETRON HCL 4 MG/2ML IJ SOLN
4.0000 mg | Freq: Once | INTRAMUSCULAR | Status: AC
  Administered 2012-03-29: 4 mg via INTRAVENOUS
  Filled 2012-03-29: qty 2

## 2012-03-29 MED ORDER — ONDANSETRON HCL 4 MG/2ML IJ SOLN
4.0000 mg | Freq: Four times a day (QID) | INTRAMUSCULAR | Status: DC | PRN
  Administered 2012-03-30: 4 mg via INTRAVENOUS
  Filled 2012-03-29: qty 2

## 2012-03-29 MED ORDER — ENOXAPARIN SODIUM 40 MG/0.4ML ~~LOC~~ SOLN: 40.0000 mg | SUBCUTANEOUS | Status: DC

## 2012-03-29 NOTE — Progress Notes (Signed)
Patient refused lovenox. 

## 2012-03-29 NOTE — ED Notes (Signed)
Pt states is itchy with hives on arms and back after morphine and Zofran given. EDP notified New orders received. Will continue to monitor

## 2012-03-29 NOTE — ED Notes (Signed)
Abd and lt flank pain , onset this am. Alert, No N/V

## 2012-03-29 NOTE — Progress Notes (Signed)
Asked MD if patient could continue to self cath. MD agreed. Patient has refused foley catheter. Patient uses 99F at home. Will notify AC.

## 2012-03-29 NOTE — H&P (Signed)
[facility] History and Physical  [name] [id number] DOB: March 09, 1991 DOA: 03/29/2012   PCP: No primary provider on file.   Chief Complaint: ABDOMINAL PAIN AND FEVER.  HPI: [name] is a 21 y.o. female with the below PMHx came in for abd pain since 3 days associated with mild subjective fevers, mucous in the urine and burning micturition. Pt has ESRD on HD and goes on TTS, she still urinates and does self catheterization once every 4 hours. Her abdominal pain is suprapubic associated with cramps. Patient also reports being nauseated since 3 days.  She  missed her last dialysis on Saturday because of family reasons. She denies any vomiting diarrhea or constipation. Patient denies cough chest pain or shortness of breath. She was admitted 2 months ago for her similar complaints was found to have Escherichia coli UTI pan sensitive. On arrival to the ED she had an acute abdomen with chest done does not show any acute pathology. Her urine was abnormal. She is being admitted for management of urinary tract infection.   Review of Systems: The patient denies anorexia, fever, weight loss,, vision loss, decreased hearing, hoarseness, chest pain, syncope, dyspnea on exertion, peripheral edema, balance deficits, hemoptysis, abdominal pain, melena, hematochezia, severe indigestion/heartburn, hematuria, incontinence, genital sores, muscle weakness, suspicious skin lesions, transient blindness, difficulty walking, depression, unusual weight change, abnormal bleeding, enlarged lymph nodes, angioedema, and breast masses.    Past Medical History  Diagnosis Date  . Spina bifida   . UTI (lower urinary tract infection)   . HTN (hypertension) 05/02/2011  . Anemia associated with chronic renal failure   . 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  . Blood transfusion   . Dialysis care   . ESRD (end stage renal disease) on dialysis    Past Surgical History  Procedure Date  .  Av fistula placement     left arm   Social History:  reports that she has never smoked. She does not have any smokeless tobacco history on file. She reports that she does not drink alcohol or use illicit drugs.  Allergies  Allergen Reactions  . Ciprofloxacin Shortness Of Breath, Nausea And Vomiting and Other (See Comments)    HIGH FEVER  . Other Anaphylaxis    Revaclear dialzer  . Peanut-Containing Drug Products Anaphylaxis  . Aleve (Naproxen Sodium) Other (See Comments)    G.I.Bleed  . Influenza Vaccines Nausea And Vomiting    High fever  . Tetanus Toxoids Nausea And Vomiting and Other (See Comments)    HIGH FEVER  . Latex Itching and Rash    Family History  Problem Relation Age of Onset  . Kidney cancer Other     Prior to Admission medications   Medication Sig Start Date End Date Taking? Authorizing Provider  acetaminophen (TYLENOL) 325 MG tablet Take 650 mg by mouth daily as needed. For pain   Yes Historical Provider, MD  Epoetin Alfa (EPOGEN IJ) Inject as directed as needed. Sometimes patient recieves this medication at dialysis.   Yes Historical Provider, MD   Physical Exam: Filed Vitals:   03/29/12 1055 03/29/12 1500  BP: 107/68 101/57  Pulse: 105 78  Temp: 98.4 F (36.9 C)   TempSrc: Oral   Resp: 20 14  Weight: 37.649 kg (83 lb)   SpO2: 99% 99%    Constitutional: Vital signs reviewed.  Patient is a poorly in no acute distress and cooperative with exam. Alert and oriented x3.  Head: Normocephalic  and atraumatic Mouth: no erythema or exudates, dry MM Eyes: PERRL, EOMI, conjunctivae normal, No scleral icterus.  Neck: Supple, Trachea midline normal ROM, No JVD, mass, thyromegaly, or carotid bruit present.  Cardiovascular: RRR, S1 normal, S2 normal, no MRG, Pulmonary/Chest: CTAB, no wheezes, rales, or rhonchi Abdominal: Soft. Mildly tender in the supra pubic area. non-distended, bowel sounds are normal, no masses, organomegaly, or guarding present.    Musculoskeletal: congenital lower extremity defects.  Neurological: A&O x3,  No new deficits.  Skin: Warm, dry and intact. No rash, cyanosis, or clubbing.  Psychiatric: Normal mood and affect. speech and behavior is normal.   Labs on Admission:  Basic Metabolic Panel:  Lab [id number] 1144  NA 132*  K 4.7  CL 98  CO2 20  GLUCOSE 83  BUN 77*  CREATININE 7.22*  CALCIUM 10.3  MG --  PHOS --   Liver Function Tests:  Lab 03/29/12 1144  AST 14  ALT 6  ALKPHOS 94  BILITOT 0.3  PROT 8.5*  ALBUMIN 3.8   No results found for this basename: LIPASE:5,AMYLASE:5 in the last 168 hours No results found for this basename: AMMONIA:5 in the last 168 hours CBC:  Lab 03/29/12 1144  WBC 4.2  NEUTROABS 1.7  HGB 12.8  HCT 39.8  MCV 93.0  PLT 215   Cardiac Enzymes: No results found for this basename: CKTOTAL:5,CKMB:5,CKMBINDEX:5,TROPONINI:5 in the last 168 hours  BNP (last 3 results) No results found for this basename: PROBNP:3 in the last 8760 hours CBG: No results found for this basename: GLUCAP:5 in the last 168 hours  Radiological Exams on Admission: Dg Abd Acute W/chest  03/29/2012  *RADIOLOGY REPORT*  Clinical Data: Abdominal pain and chest pain  ACUTE ABDOMEN SERIES (ABDOMEN 2 VIEW & CHEST 1 VIEW)  Comparison: 01/18/2012  Findings: The lungs are well aerated and clear.  No airspace opacification.  No pleural effusion or pneumothorax.  The visualized bowel gas pattern is unremarkable.  Stool and gas seen within the colon.  No evidence for small bowel dilatation.  A moderate amount of stool is noted within the rectum.  Chronic osseous abnormalities are again noted and appear stable.  IMPRESSION:  1.  Unremarkable bowel gas pattern.  No evidence for obstruction. 2.  No active cardiopulmonary abnormalities   Original Report Authenticated By: Angelita Ingles, M.D.       Assessment/Plan Active Problems:  ESRD (end stage renal disease) on dialysis  HTN (hypertension)  Hypokalemia   UTI (lower urinary tract infection)  Abdominal pain   1. UTI: urine cultures sent, and started her on rocephin and her prior cultures were sensitive to cephalosporins. Gentle hydration. 2. ESRD on HD: RENAL consult in EPIC, for HD in am.  3. Hyponatremia probably from ESRD.  DVT prophylaxis    Jamaira Sherk [facility] Pager [phone]  If 7PM-7AM, please contact night-coverage [email or web address] Password [facility] 03/29/2012, 3:33 PM

## 2012-03-29 NOTE — ED Provider Notes (Signed)
History  This chart was scribed for Ezequiel Essex, MD by Roe Coombs. The patient was seen in room APA12/APA12. Patient's care was started at 1119.     CSN: [id number]  Arrival date & time 03/29/12  47   First MD Initiated Contact with Patient 03/29/12 1119      Chief Complaint  Patient presents with  . Abdominal Pain    Patient is a 21 y.o. female presenting with abdominal pain. The history is provided by the patient. No language interpreter was used.  Abdominal Pain The primary symptoms of the illness include abdominal pain and fever. The primary symptoms of the illness do not include nausea or vomiting. The current episode started 3 to 5 hours ago. The onset of the illness was sudden. The problem has not changed since onset. The abdominal pain began 3 to 5 hours ago. The pain came on suddenly. The abdominal pain has been unchanged since its onset. The abdominal pain is located in the LLQ and left flank. The abdominal pain radiates to the left flank. The abdominal pain is relieved by nothing.  The fever began today. The fever has been resolved since its onset. The maximum temperature recorded prior to her arrival was unknown.  The patient states that she believes she is currently not pregnant. The patient has not had a change in bowel habit. Significant associated medical issues do not include liver disease.    [name] is a 21 y.o. female who presents to the Emergency Department complaining of moderate, constant, left lower abdominal pain and left flank pain onset several hours ago. Patient denies nausea, vomiting, vaginal bleeding or discharge. Produces some urine everyday. Self-catheterizes every 4 hours and produces urine everyday. She says that there has been some mucous in her urine. Patient is a dialysis patient. Last treatment was Thursday. She missed her regularly scheduled Saturday treatment. Patient's friend transported her to the ED. Her medical history includes spina  bifida, UTI, HTN, anemia, ESRD, blood transfusion, caudal regression syndrome.  Past Medical History  Diagnosis Date  . Spina bifida   . UTI (lower urinary tract infection)   . HTN (hypertension) 05/02/2011  . Anemia associated with chronic renal failure   . 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  . Blood transfusion   . Dialysis care   . ESRD (end stage renal disease) on dialysis     Past Surgical History  Procedure Date  . Av fistula placement     left arm    Family History  Problem Relation Age of Onset  . Kidney cancer Other     History  Substance Use Topics  . Smoking status: Never Smoker   . Smokeless tobacco: Not on file  . Alcohol Use: No    OB History    Grav Para Term Preterm Abortions TAB SAB Ect Mult Living                  Review of Systems  Constitutional: Positive for fever.  Gastrointestinal: Positive for abdominal pain. Negative for nausea and vomiting.  Genitourinary: Positive for flank pain.  All other systems reviewed and are negative.    Allergies  Ciprofloxacin; Other; Peanut-containing drug products; Aleve; Influenza vaccines; Tetanus toxoids; and Latex  Home Medications   Current Outpatient Rx  Name Route Sig Dispense Refill  . ACETAMINOPHEN 325 MG PO TABS Oral Take 650 mg by mouth daily as needed. For pain    . EPOGEN IJ Injection  Inject as directed as needed. Sometimes patient recieves this medication at dialysis.      BP 107/68  Pulse 105  Temp 98.4 F (36.9 C) (Oral)  Resp 20  Wt 83 lb (37.649 kg)  SpO2 99%  Physical Exam  Constitutional: She is oriented to person, place, and time. She appears well-developed and well-nourished.  HENT:  Head: Normocephalic and atraumatic.  Cardiovascular: Normal rate and regular rhythm.   No murmur heard. Pulmonary/Chest: Effort normal and breath sounds normal. No respiratory distress.  Abdominal: Soft. Normal appearance. There is tenderness in the left lower quadrant.  There is no rebound, no guarding and no CVA tenderness.  Musculoskeletal:       Chronic lower extremity deformities  Neurological: She is alert and oriented to person, place, and time.  Skin: Skin is warm and dry.  Psychiatric: Her behavior is normal.    ED Course  Procedures (including critical care time) DIAGNOSTIC STUDIES: Oxygen Saturation is 99% on room air, normal by my interpretation.    COORDINATION OF CARE: 11:27am- Patient informed of current plan for treatment and evaluation and agrees with plan at this time. Will administer IV fluids and Zofran 4 mg. Have ordered urinalysis, urine culture, CBC and comp metabolic panel.     Labs Reviewed  URINALYSIS, ROUTINE W REFLEX MICROSCOPIC - Abnormal; Notable for the following:    Hgb urine dipstick SMALL (*)     Protein, ur 100 (*)     Leukocytes, UA MODERATE (*)     All other components within normal limits  CBC WITH DIFFERENTIAL - Abnormal; Notable for the following:    Neutrophils Relative 40 (*)     Eosinophils Relative 7 (*)     All other components within normal limits  COMPREHENSIVE METABOLIC PANEL - Abnormal; Notable for the following:    Sodium 132 (*)     BUN 77 (*)     Creatinine, Ser 7.22 (*)     Total Protein 8.5 (*)     GFR calc non Af Amer 7 (*)     GFR calc Af Amer 9 (*)     All other components within normal limits  PREGNANCY, URINE  URINE MICROSCOPIC-ADD ON  URINE CULTURE    Dg Abd Acute W/chest  03/29/2012  *RADIOLOGY REPORT*  Clinical Data: Abdominal pain and chest pain  ACUTE ABDOMEN SERIES (ABDOMEN 2 VIEW & CHEST 1 VIEW)  Comparison: 01/18/2012  Findings: The lungs are well aerated and clear.  No airspace opacification.  No pleural effusion or pneumothorax.  The visualized bowel gas pattern is unremarkable.  Stool and gas seen within the colon.  No evidence for small bowel dilatation.  A moderate amount of stool is noted within the rectum.  Chronic osseous abnormalities are again noted and appear  stable.  IMPRESSION:  1.  Unremarkable bowel gas pattern.  No evidence for obstruction. 2.  No active cardiopulmonary abnormalities   Original Report Authenticated By: Angelita Ingles, M.D.      No diagnosis found.    MDM  Abdominal pain left-sided flank pain with urinary symptoms. History of spina bifida with self-catheterization, ESRD on dialysis. No fevers.  Urinalysis appears infected. Given Rocephin.  Continue IVF and antibiotics.   Will admit given history of complicated UTIs.   I personally performed the services described in this documentation, which was scribed in my presence.  The recorded information has been reviewed and considered.    Ezequiel Essex, MD 03/29/12 1530

## 2012-03-30 ENCOUNTER — Inpatient Hospital Stay (HOSPITAL_COMMUNITY): Payer: Medicare Other

## 2012-03-30 DIAGNOSIS — I10 Essential (primary) hypertension: Secondary | ICD-10-CM

## 2012-03-30 DIAGNOSIS — D649 Anemia, unspecified: Secondary | ICD-10-CM

## 2012-03-30 LAB — CBC
HCT: 33.6 % — ABNORMAL LOW (ref 36.0–46.0)
Hemoglobin: 10.7 g/dL — ABNORMAL LOW (ref 12.0–15.0)
MCH: 30 pg (ref 26.0–34.0)
MCHC: 31.8 g/dL (ref 30.0–36.0)
RDW: 14.8 % (ref 11.5–15.5)

## 2012-03-30 LAB — COMPREHENSIVE METABOLIC PANEL
Albumin: 3.2 g/dL — ABNORMAL LOW (ref 3.5–5.2)
BUN: 76 mg/dL — ABNORMAL HIGH (ref 6–23)
Calcium: 9 mg/dL (ref 8.4–10.5)
Creatinine, Ser: 6.86 mg/dL — ABNORMAL HIGH (ref 0.50–1.10)
Glucose, Bld: 79 mg/dL (ref 70–99)
Total Protein: 7.1 g/dL (ref 6.0–8.3)

## 2012-03-30 MED ORDER — HEPARIN SODIUM (PORCINE) 1000 UNIT/ML DIALYSIS
20.0000 [IU]/kg | INTRAMUSCULAR | Status: DC | PRN
  Administered 2012-03-30: 600 [IU] via INTRAVENOUS_CENTRAL
  Filled 2012-03-30: qty 1

## 2012-03-30 MED ORDER — ENOXAPARIN SODIUM 30 MG/0.3ML ~~LOC~~ SOLN: 30.0000 mg | SUBCUTANEOUS | Status: DC

## 2012-03-30 MED ORDER — EPOETIN ALFA 10000 UNIT/ML IJ SOLN
10000.0000 [IU] | INTRAMUSCULAR | Status: DC
  Filled 2012-03-30: qty 1

## 2012-03-30 MED ORDER — EPOETIN ALFA 10000 UNIT/ML IJ SOLN
10000.0000 [IU] | INTRAMUSCULAR | Status: DC
  Administered 2012-03-30: 10000 [IU] via INTRAVENOUS
  Filled 2012-03-30: qty 1

## 2012-03-30 MED ORDER — HEPARIN SODIUM (PORCINE) 1000 UNIT/ML DIALYSIS
300.0000 [IU] | INTRAMUSCULAR | Status: DC | PRN
  Filled 2012-03-30: qty 1

## 2012-03-30 NOTE — Progress Notes (Signed)
Dr. Berton Bon called in informed of pt refusing to be stuck with 15G needles so 17g needles used, therefore I could not obtain the ordered BFR and DFR. Also informed him of not being able to pull fluid from pt because BP would drop and HR would increase. Also states he wants epogen given today rather them Wed.

## 2012-03-30 NOTE — Progress Notes (Signed)
     Subjective: This lady comes in once again with what appears to be another UTI. Since yesterday, she does feel somewhat better and her abdominal pain is improved. She has no nausea or vomiting. She did receive dialysis today.           Physical Exam: Blood pressure 94/57, pulse 80, temperature 98 F (36.7 C), temperature source Oral, resp. rate 16, height 3' (0.914 m), weight 30.1 kg (66 lb 5.7 oz), SpO2 99.00%. She looks systemically well. She is not toxic or septic. Her abdomen is soft and nontender. She has no loin tenderness. She is alert and orientated.   Investigations:  Recent Results (from the past 240 hour(s))  MRSA PCR SCREENING     Status: Normal   Collection Time   03/29/12  4:50 PM      Component Value Range Status Comment   MRSA by PCR NEGATIVE  NEGATIVE Final      Basic Metabolic Panel:  Basename 03/30/12 0517 03/29/12 1144  NA 133* 132*  K 4.8 4.7  CL 105 98  CO2 15* 20  GLUCOSE 79 83  BUN 76* 77*  CREATININE 6.86* 7.22*  CALCIUM 9.0 10.3  MG -- --  PHOS -- --   Liver Function Tests:  [facility] 03/30/12 0517 03/29/12 1144  AST 10 14  ALT 5 6  ALKPHOS 82 94  BILITOT 0.2* 0.3  PROT 7.1 8.5*  ALBUMIN 3.2* 3.8     CBC:  Basename 03/30/12 0517 03/29/12 1144  WBC 4.6 4.2  NEUTROABS -- 1.7  HGB 10.7* 12.8  HCT 33.6* 39.8  MCV 94.1 93.0  PLT 186 215    Dg Abd Acute W/chest  03/29/2012  *RADIOLOGY REPORT*  Clinical Data: Abdominal pain and chest pain  ACUTE ABDOMEN SERIES (ABDOMEN 2 VIEW & CHEST 1 VIEW)  Comparison: 01/18/2012  Findings: The lungs are well aerated and clear.  No airspace opacification.  No pleural effusion or pneumothorax.  The visualized bowel gas pattern is unremarkable.  Stool and gas seen within the colon.  No evidence for small bowel dilatation.  A moderate amount of stool is noted within the rectum.  Chronic osseous abnormalities are again noted and appear stable.  IMPRESSION:  1.  Unremarkable bowel gas pattern.  No  evidence for obstruction. 2.  No active cardiopulmonary abnormalities   Original Report Authenticated By: Angelita Ingles, M.D.       Medications:  Scheduled:   . sodium chloride   Intravenous STAT  . cefTRIAXone (ROCEPHIN)  IV  1 g Intravenous Once  . cefTRIAXone (ROCEPHIN)  IV  1 g Intravenous Q24H  . diphenhydrAMINE  25 mg Intravenous Once  . enoxaparin (LOVENOX) injection  30 mg Subcutaneous Q24H  . epoetin alfa  10,000 Units Intravenous 3 times weekly  .  morphine injection  4 mg Intravenous Once  . ondansetron  4 mg Intravenous Once  . sodium chloride  1,000 mL Intravenous Once  . DISCONTD: enoxaparin (LOVENOX) injection  40 mg Subcutaneous Q24H    Impression: 1. Probable UTI, on intravenous Rocephin. 2. End-stage renal disease on hemodialysis, do today. 3. History of hypertension.     Plan: 1. Continue with intravenous antibiotics. 2. Dialysis today. 3. She probably could be discharged home tomorrow, if she continues to improve.     LOS: 1 day   Doree Albee Pager [phone]  03/30/2012, 10:05 AM

## 2012-03-30 NOTE — Progress Notes (Signed)
Pt would not allow me to stick her with 15G needles. Wanted to be stuck with 16g. Had no needles that were 16G that were not blunted tips. I informed pt that I only had 15G and 17G needles that I can use and that she would get a much better Tx if the 15G needles were used. Pt continued to refuse to let me stick her with 15G. Pt was stuck with 17G needles and the BFR and Dialysate rate were decreased due to the small needles.

## 2012-03-30 NOTE — Clinical Social Work Note (Signed)
CSW received referral concerning where pt will live at d/c. CSW introduced self to pt and she states she does not need a Education officer, museum. She is working on finding her own apartment to live independently. No concerns reported by pt. CSW will sign off unless further needs arise prior to d/c.  Benay Pike, [place]

## 2012-03-30 NOTE — Consult Note (Signed)
Reason for Consult: End-stage renal disease Referring Physician: Dr. Boyce Jeanne [name] is an 21 y.o. female.  HPI:  Jeanne [name] was history of  hypertension, end-stage renal disease on maintenance hemodialysis presently came was accomplice of abdominal pain. [name] was found to have fever and admitted for possible pyelonephritis. Presently [name] denies any nausea vomiting denies also any difficulty breathing. According to the [name] Jeanne last dialysis was on Thursday and she didn't go to the dialysis on Saturday. Presently she denies any difficulty in breathing she doesn't have any orthopnea or paroxysmal nocturnal dyspnea.  Past Medical History  Diagnosis Date  . Spina bifida   . UTI (lower urinary tract infection)   . HTN (hypertension) 05/02/2011  . Anemia associated with chronic renal failure   . 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  . Blood transfusion   . Dialysis care   . ESRD (end stage renal disease) on dialysis     Past Surgical History  Procedure Date  . Av fistula placement     left arm    Family History  Problem Relation Age of Onset  . Kidney cancer Other     Social History:  reports that she has never smoked. She does not have any smokeless tobacco history on file. She reports that she does not drink alcohol or use illicit drugs.  Allergies:  Allergies  Allergen Reactions  . Ciprofloxacin Shortness Of Breath, Nausea And Vomiting and Other (See Comments)    HIGH FEVER  . Other Anaphylaxis    Revaclear dialzer  . Peanut-Containing Drug Products Anaphylaxis  . Aleve (Naproxen Sodium) Other (See Comments)    G.I.Bleed  . Influenza Vaccines Nausea And Vomiting    High fever  . Tetanus Toxoids Nausea And Vomiting and Other (See Comments)    HIGH FEVER  . Latex Itching and Rash    Medications: I have reviewed the [name]'s current medications.  Results for orders placed during the hospital encounter of 03/29/12 (from the  past 48 hour(s))  CBC WITH DIFFERENTIAL     Status: Abnormal   Collection Time   03/29/12 11:44 AM      Component Value Range Comment   WBC 4.2  4.0 - 10.5 K/uL    RBC 4.28  3.87 - 5.11 MIL/uL    Hemoglobin 12.8  12.0 - 15.0 g/dL    HCT 39.8  36.0 - 46.0 %    MCV 93.0  78.0 - 100.0 fL    MCH 29.9  26.0 - 34.0 pg    MCHC 32.2  30.0 - 36.0 g/dL    RDW 14.8  11.5 - 15.5 %    Platelets 215  150 - 400 K/uL    Neutrophils Relative 40 (*) 43 - 77 %    Neutro Abs 1.7  1.7 - 7.7 K/uL    Lymphocytes Relative 44  12 - 46 %    Lymphs Abs 1.8  0.7 - 4.0 K/uL    Monocytes Relative 10  3 - 12 %    Monocytes Absolute 0.4  0.1 - 1.0 K/uL    Eosinophils Relative 7 (*) 0 - 5 %    Eosinophils Absolute 0.3  0.0 - 0.7 K/uL    Basophils Relative 1  0 - 1 %    Basophils Absolute 0.0  0.0 - 0.1 K/uL   COMPREHENSIVE METABOLIC PANEL     Status: Abnormal   Collection Time   03/29/12 11:44  AM      Component Value Range Comment   Sodium 132 (*) 135 - 145 mEq/L    Potassium 4.7  3.5 - 5.1 mEq/L    Chloride 98  96 - 112 mEq/L    CO2 20  19 - 32 mEq/L    Glucose, Bld 83  70 - 99 mg/dL    BUN 77 (*) 6 - 23 mg/dL    Creatinine, Ser 7.22 (*) 0.50 - 1.10 mg/dL    Calcium 10.3  8.4 - 10.5 mg/dL    Total Protein 8.5 (*) 6.0 - 8.3 g/dL    Albumin 3.8  3.5 - 5.2 g/dL    AST 14  0 - 37 U/L    ALT 6  0 - 35 U/L    Alkaline Phosphatase 94  39 - 117 U/L    Total Bilirubin 0.3  0.3 - 1.2 mg/dL    GFR calc non Af Amer 7 (*) >90 mL/min    GFR calc Af Amer 9 (*) >90 mL/min   URINALYSIS, ROUTINE W REFLEX MICROSCOPIC     Status: Abnormal   Collection Time   03/29/12  1:35 PM      Component Value Range Comment   Color, Urine YELLOW  YELLOW    APPearance CLEAR  CLEAR    Specific Gravity, Urine 1.010  1.005 - 1.030    pH 7.5  5.0 - 8.0    Glucose, UA NEGATIVE  NEGATIVE mg/dL    Hgb urine dipstick SMALL (*) NEGATIVE    Bilirubin Urine NEGATIVE  NEGATIVE    Ketones, ur NEGATIVE  NEGATIVE mg/dL    Protein, ur 100 (*)  NEGATIVE mg/dL    Urobilinogen, UA 0.2  0.0 - 1.0 mg/dL    Nitrite NEGATIVE  NEGATIVE    Leukocytes, UA MODERATE (*) NEGATIVE   PREGNANCY, URINE     Status: Normal   Collection Time   03/29/12  1:35 PM      Component Value Range Comment   Preg Test, Ur NEGATIVE  NEGATIVE   URINE MICROSCOPIC-ADD ON     Status: Normal   Collection Time   03/29/12  1:35 PM      Component Value Range Comment   WBC, UA TOO NUMEROUS TO COUNT  <3 WBC/hpf    RBC / HPF 3-6  <3 RBC/hpf   MRSA PCR SCREENING     Status: Normal   Collection Time   03/29/12  4:50 PM      Component Value Range Comment   MRSA by PCR NEGATIVE  NEGATIVE   CBC     Status: Abnormal   Collection Time   03/30/12  5:17 AM      Component Value Range Comment   WBC 4.6  4.0 - 10.5 K/uL    RBC 3.57 (*) 3.87 - 5.11 MIL/uL    Hemoglobin 10.7 (*) 12.0 - 15.0 g/dL    HCT 33.6 (*) 36.0 - 46.0 %    MCV 94.1  78.0 - 100.0 fL    MCH 30.0  26.0 - 34.0 pg    MCHC 31.8  30.0 - 36.0 g/dL    RDW 14.8  11.5 - 15.5 %    Platelets 186  150 - 400 K/uL   COMPREHENSIVE METABOLIC PANEL     Status: Abnormal   Collection Time   03/30/12  5:17 AM      Component Value Range Comment   Sodium 133 (*) 135 - 145 mEq/L    Potassium  4.8  3.5 - 5.1 mEq/L    Chloride 105  96 - 112 mEq/L    CO2 15 (*) 19 - 32 mEq/L    Glucose, Bld 79  70 - 99 mg/dL    BUN 76 (*) 6 - 23 mg/dL    Creatinine, Ser 6.86 (*) 0.50 - 1.10 mg/dL    Calcium 9.0  8.4 - 10.5 mg/dL    Total Protein 7.1  6.0 - 8.3 g/dL    Albumin 3.2 (*) 3.5 - 5.2 g/dL    AST 10  0 - 37 U/L    ALT 5  0 - 35 U/L    Alkaline Phosphatase 82  39 - 117 U/L    Total Bilirubin 0.2 (*) 0.3 - 1.2 mg/dL    GFR calc non Af Amer 8 (*) >90 mL/min    GFR calc Af Amer 9 (*) >90 mL/min     Dg Abd Acute W/chest  03/29/2012  *RADIOLOGY REPORT*  Clinical Data: Abdominal pain and chest pain  ACUTE ABDOMEN SERIES (ABDOMEN 2 VIEW & CHEST 1 VIEW)  Comparison: 01/18/2012  Findings: The lungs are well aerated and clear.  No airspace  opacification.  No pleural effusion or pneumothorax.  The visualized bowel gas pattern is unremarkable.  Stool and gas seen within the colon.  No evidence for small bowel dilatation.  A moderate amount of stool is noted within the rectum.  Chronic osseous abnormalities are again noted and appear stable.  IMPRESSION:  1.  Unremarkable bowel gas pattern.  No evidence for obstruction. 2.  No active cardiopulmonary abnormalities   Original Report Authenticated By: Angelita Ingles, M.D.     Review of Systems  Constitutional: Positive for fever.  Respiratory: Negative for sputum production and shortness of breath.   Gastrointestinal: Positive for abdominal pain. Negative for diarrhea.  Neurological: Positive for weakness.   Blood pressure 94/57, pulse 80, temperature 98 F (36.7 C), temperature source Oral, resp. rate 16, height 3' (0.914 m), weight 30.1 kg (66 lb 5.7 oz), SpO2 99.00%. Physical Exam  Eyes: No scleral icterus.  Neck: No JVD present.  Cardiovascular: Normal rate, regular rhythm and normal heart sounds.   No murmur heard. Respiratory: She has no wheezes. She has no rales.  GI: She exhibits no distension. There is no tenderness.    Assessment/Plan:  problem #1 end-stage renal disease: she status post hemodialysis on Thursday and she missed Saturday dialysis. Jeanne BUN and creatinine is in acceptable range. She doesn't have any uremic sinus symptoms. Problem #2 fever: presently [name] is a febrile and Jeanne white blood cell count is normal. Problem #3 anemia: Most likely secondary to end-stage renal disease H&H is was in acceptable range. Problem #4 abdominal pain Problem #5 hypertension: Jeanne blood pressure seems to be reasonably controlled. Problem #6 spina bifida Problem #7 metabolic bone disease [name] calcium is normal and phosphorus as this moment is not available. Plan: We'll make arrangements for [name] to get dialysis today We'll check Jeanne basic metabolic panel and  phosphorus in the morning We'll put on Epogen 10,000 units IV after each dialysis.   Mishawn Hemann S 03/30/2012, 7:42 AM

## 2012-03-31 LAB — BASIC METABOLIC PANEL
CO2: 27 mEq/L (ref 19–32)
Chloride: 99 mEq/L (ref 96–112)
Creatinine, Ser: 4.9 mg/dL — ABNORMAL HIGH (ref 0.50–1.10)
GFR calc Af Amer: 14 mL/min — ABNORMAL LOW (ref >90)
Potassium: 3.9 mEq/L (ref 3.5–5.1)
Sodium: 139 mEq/L (ref 135–145)

## 2012-03-31 LAB — PHOSPHORUS: Phosphorus: 6.6 mg/dL — ABNORMAL HIGH (ref 2.3–4.6)

## 2012-03-31 LAB — URINE CULTURE

## 2012-03-31 MED ORDER — CEFUROXIME AXETIL 250 MG PO TABS: 250.0000 mg | ORAL_TABLET | Freq: Two times a day (BID) | ORAL | Status: AC

## 2012-03-31 MED ORDER — OXYCODONE HCL 5 MG PO TABS
5.0000 mg | ORAL_TABLET | ORAL | Status: DC | PRN
  Administered 2012-03-31 (×2): 5 mg via ORAL
  Filled 2012-03-31 (×2): qty 1

## 2012-03-31 NOTE — Progress Notes (Signed)
Pt discharged home. Pt  Was given discharge instructions. Pt was educated on new medications and sterile technique during self-catheterization.  Pt verbalized understanding of instructions. [name] D 03/31/2012

## 2012-03-31 NOTE — Discharge Summary (Signed)
Physician Discharge Summary  JACQUITA GIANNINI [id number] DOB: 12-Sep-1990 DOA: 03/29/2012  PCP: No primary provider on file.  Admit date: 03/29/2012 Discharge date: 03/31/2012  Recommendations for Outpatient Follow-up:  1. Followup for hemodialysis with Dr Hinda Lenis.   Discharge Diagnoses:  1. Escherichia coli UTI. Clinically improved. 2. End-stage renal disease on hemodialysis. 3. Hypertension.   Discharge Condition: Stable and improved.  Diet recommendation: Renal diet.  Filed Weights   03/29/12 1627 03/30/12 1300 03/30/12 1620  Weight: 30.1 kg (66 lb 5.7 oz) 27.8 kg (61 lb 4.6 oz) 27.8 kg (61 lb 4.6 oz)    History of present illness:  This 21 year old lady came to the hospital again with symptoms of abdominal pain and fever. She recently was discharged with Escherichia coli UTI and previously and presented again with similar symptoms. Urinalysis was indicative of UTI and she was started on intravenous ceftriaxone. She made good improvement very quickly. She had dialysis while she was an inpatient. Urine culture so far is showing Escherichia coli again. She has no fever, is tolerating diet and is not toxic, she is stable for discharge.  Hospital Course:  AS ABOVE  Procedures:  HD  Consultations:  NEPHROLOGY,Dr Befakadu  Discharge Exam: Filed Vitals:   03/31/12 0540  BP: 111/72  Pulse: 78  Temp: 97.5 F (36.4 C)  Resp: 18   Filed Vitals:   03/30/12 1620 03/30/12 1630 03/30/12 1805 03/31/12 0540  BP: 113/69 103/64 95/59 111/72  Pulse: 89 86 113 78  Temp: 97.9 F (36.6 C)  98.8 F (37.1 C) 97.5 F (36.4 C)  TempSrc: Oral  Oral Oral  Resp: 17 16 18 18   Height:      Weight: 27.8 kg (61 lb 4.6 oz)     SpO2: 98%  98% 100%    General: She looks systemically well. She is not toxic or septic. Cardiovascular: Heart sounds are present and normal without murmurs. Respiratory: Lung fields are clear. She is alert and orientated.  Discharge Instructions  Discharge  Orders    Future Orders Please Complete By Expires   Diet - low sodium heart healthy      Increase activity slowly        Medication List  As of 03/31/2012 10:00 AM   TAKE these medications         acetaminophen 325 MG tablet   Commonly known as: TYLENOL   Take 650 mg by mouth daily as needed. For pain      cefUROXime 250 MG tablet   Commonly known as: CEFTIN   Take 1 tablet (250 mg total) by mouth 2 (two) times daily.      EPOGEN IJ   Inject as directed as needed. Sometimes patient recieves this medication at dialysis.              The results of significant diagnostics from this hospitalization (including imaging, microbiology, ancillary and laboratory) are listed below for reference.    Significant Diagnostic Studies: Dg Abd Acute W/chest  03/29/2012  *RADIOLOGY REPORT*  Clinical Data: Abdominal pain and chest pain  ACUTE ABDOMEN SERIES (ABDOMEN 2 VIEW & CHEST 1 VIEW)  Comparison: 01/18/2012  Findings: The lungs are well aerated and clear.  No airspace opacification.  No pleural effusion or pneumothorax.  The visualized bowel gas pattern is unremarkable.  Stool and gas seen within the colon.  No evidence for small bowel dilatation.  A moderate amount of stool is noted within the rectum.  Chronic osseous abnormalities are again noted  and appear stable.  IMPRESSION:  1.  Unremarkable bowel gas pattern.  No evidence for obstruction. 2.  No active cardiopulmonary abnormalities   Original Report Authenticated By: Angelita Ingles, M.D.     Microbiology: Recent Results (from the past 240 hour(s))  URINE CULTURE     Status: Normal (Preliminary result)   Collection Time   03/29/12  1:35 PM      Component Value Range Status Comment   Specimen Description URINE, CATHETERIZED   Final    Special Requests NONE   Final    Culture  Setup Time 03/30/2012 03:05   Final    Colony Count >=100,000 COLONIES/ML   Final    Culture ESCHERICHIA COLI   Final    Report Status PENDING   Incomplete     MRSA PCR SCREENING     Status: Normal   Collection Time   03/29/12  4:50 PM      Component Value Range Status Comment   MRSA by PCR NEGATIVE  NEGATIVE Final      Labs: Basic Metabolic Panel:  Lab [id number] 03/30/12 0517 03/29/12 1144  NA 139 133* 132*  K 3.9 4.8 4.7  CL 99 105 98  CO2 27 15* 20  GLUCOSE 92 79 83  BUN 39* 76* 77*  CREATININE 4.90* 6.86* 7.22*  CALCIUM 9.2 9.0 10.3  MG -- -- --  PHOS 6.6* -- --   Liver Function Tests:  Lab 03/30/12 0517 03/29/12 1144  AST 10 14  ALT 5 6  ALKPHOS 82 94  BILITOT 0.2* 0.3  PROT 7.1 8.5*  ALBUMIN 3.2* 3.8     CBC:  Lab 03/30/12 0517 03/29/12 1144  WBC 4.6 4.2  NEUTROABS -- 1.7  HGB 10.7* 12.8  HCT 33.6* 39.8  MCV 94.1 93.0  PLT 186 215     Time coordinating discharge: Less than 30 minutes  Signed:  [facility] 03/31/2012, 10:00 AM

## 2012-04-09 ENCOUNTER — Encounter (HOSPITAL_COMMUNITY): Payer: Self-pay | Admitting: *Deleted

## 2012-04-09 ENCOUNTER — Emergency Department (HOSPITAL_COMMUNITY)
Admission: EM | Admit: 2012-04-09 | Discharge: 2012-04-09 | Disposition: A | Discharge status: Home / Self Care | Payer: Medicare Other | Attending: Emergency Medicine | Admitting: Emergency Medicine

## 2012-04-09 DIAGNOSIS — Q059 Spina bifida, unspecified: Secondary | ICD-10-CM | POA: Insufficient documentation

## 2012-04-09 DIAGNOSIS — I120 Hypertensive chronic kidney disease with stage 5 chronic kidney disease or end stage renal disease: Secondary | ICD-10-CM | POA: Insufficient documentation

## 2012-04-09 DIAGNOSIS — N186 End stage renal disease: Secondary | ICD-10-CM | POA: Insufficient documentation

## 2012-04-09 DIAGNOSIS — Z887 Allergy status to serum and vaccine status: Secondary | ICD-10-CM | POA: Insufficient documentation

## 2012-04-09 DIAGNOSIS — Z992 Dependence on renal dialysis: Secondary | ICD-10-CM

## 2012-04-09 DIAGNOSIS — Z91010 Allergy to peanuts: Secondary | ICD-10-CM | POA: Insufficient documentation

## 2012-04-09 LAB — BASIC METABOLIC PANEL
BUN: 85 mg/dL — ABNORMAL HIGH (ref 6–23)
CO2: 15 mEq/L — ABNORMAL LOW (ref 19–32)
Chloride: 105 mEq/L (ref 96–112)
Glucose, Bld: 102 mg/dL — ABNORMAL HIGH (ref 70–99)
Potassium: 4.3 mEq/L (ref 3.5–5.1)
Sodium: 135 mEq/L (ref 135–145)

## 2012-04-09 LAB — CBC
HCT: 34.6 % — ABNORMAL LOW (ref 36.0–46.0)
Hemoglobin: 11.4 g/dL — ABNORMAL LOW (ref 12.0–15.0)
RBC: 3.82 MIL/uL — ABNORMAL LOW (ref 3.87–5.11)
WBC: 5.1 10*3/uL (ref 4.0–10.5)

## 2012-04-09 NOTE — ED Notes (Signed)
Pt awaiting transport home via EMS.

## 2012-04-09 NOTE — ED Notes (Signed)
Pt reports she hasn't had dialysis since last week.  No distress noted.  Pt reports she does have SOB "when I talk a lot". Reporting occasional sharp abdominal pain. Denies nausea or vomiting.

## 2012-04-09 NOTE — ED Notes (Signed)
Pt denies pain at this time.  Pt requesting a sandwich and something to drink.

## 2012-04-09 NOTE — ED Notes (Signed)
Missed her dialyisis.this week, supposed to go Tue, thur, and Sat.  Pt says she was called and told to come to ER.  Came by EMS. Says she did not have transportation to go to dialysis.  Alert NAD

## 2012-04-09 NOTE — ED Provider Notes (Signed)
History     CSN: [id number]  Arrival date & time 04/09/12  1819   First MD Initiated Contact with Patient 04/09/12 1903      Chief complaint: missed dialyses   (Consider location/radiation/quality/duration/timing/severity/associated sxs/prior treatment) The history is provided by the patient.  pt with esrd on hd for past 2 years, states dialyses is on tues/thurs/sat, but that she did not have ride to dialyses center this week, so her last dialyses was last Saturday. States occasionally misses hd due to transportation problems. Denies sob. No weak/faintness. No nv. States was told was called today by dialyses center and told to go to ER.  Pt denies other symptoms.   Past Medical History  Diagnosis Date  . Spina bifida   . UTI (lower urinary tract infection)   . HTN (hypertension) 05/02/2011  . Anemia associated with chronic renal failure   . 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  . Blood transfusion   . Dialysis care   . ESRD (end stage renal disease) on dialysis     Past Surgical History  Procedure Date  . Av fistula placement     left arm    Family History  Problem Relation Age of Onset  . Kidney cancer Other     History  Substance Use Topics  . Smoking status: Never Smoker   . Smokeless tobacco: Not on file  . Alcohol Use: No    OB History    Grav Para Term Preterm Abortions TAB SAB Ect Mult Living                  Review of Systems  Constitutional: Negative for fever and chills.  HENT: Negative for neck pain.   Eyes: Negative for redness.  Respiratory: Negative for shortness of breath.   Cardiovascular: Negative for chest pain.  Gastrointestinal: Negative for abdominal pain.  Genitourinary: Negative for flank pain.  Musculoskeletal: Negative for back pain.  Skin: Negative for rash.  Neurological: Negative for headaches.  Hematological: Does not bruise/bleed easily.  Psychiatric/Behavioral: Negative for confusion.    Allergies    Ciprofloxacin; Other; Peanut-containing drug products; Aleve; Influenza vaccines; Tetanus toxoids; and Latex  Home Medications   Current Outpatient Rx  Name Route Sig Dispense Refill  . ACETAMINOPHEN 325 MG PO TABS Oral Take 650 mg by mouth daily as needed. For pain    . CEFUROXIME AXETIL 250 MG PO TABS Oral Take 1 tablet (250 mg total) by mouth 2 (two) times daily. 10 tablet 0  . EPOGEN IJ Injection Inject as directed as needed. Sometimes patient recieves this medication at dialysis.      BP 119/67  Pulse 92  Temp 99.2 F (37.3 C) (Oral)  Resp 16  SpO2 98%  Physical Exam  Nursing note and vitals reviewed. Constitutional: She appears well-developed and well-nourished. No distress.  HENT:  Mouth/Throat: Oropharynx is clear and moist.  Eyes: Conjunctivae normal are normal. No scleral icterus.  Neck: Neck supple. No tracheal deviation present.  Cardiovascular: Normal rate.   Pulmonary/Chest: Effort normal and breath sounds normal. No respiratory distress.  Abdominal: Soft. Normal appearance. She exhibits no distension. There is no tenderness.  Musculoskeletal: She exhibits no edema.       Left upper arm fistula w palp thrill  Neurological: She is alert.  Skin: Skin is warm and dry. No rash noted.  Psychiatric: She has a normal mood and affect.    ED Course  Procedures (including critical care time)  Labs Reviewed  CBC  BASIC METABOLIC PANEL    Results for orders placed during the hospital encounter of 04/09/12  CBC      Component Value Range   WBC 5.1  4.0 - 10.5 K/uL   RBC 3.82 (*) 3.87 - 5.11 MIL/uL   Hemoglobin 11.4 (*) 12.0 - 15.0 g/dL   HCT 34.6 (*) 36.0 - 46.0 %   MCV 90.6  78.0 - 100.0 fL   MCH 29.8  26.0 - 34.0 pg   MCHC 32.9  30.0 - 36.0 g/dL   RDW 15.0  11.5 - 15.5 %   Platelets 261  150 - 400 K/uL  BASIC METABOLIC PANEL      Component Value Range   Sodium 135  135 - 145 mEq/L   Potassium 4.3  3.5 - 5.1 mEq/L   Chloride 105  96 - 112 mEq/L   CO2  15 (*) 19 - 32 mEq/L   Glucose, Bld 102 (*) 70 - 99 mg/dL   BUN 85 (*) 6 - 23 mg/dL   Creatinine, Ser 5.97 (*) 0.50 - 1.10 mg/dL   Calcium 9.1  8.4 - 10.5 mg/dL   GFR calc non Af Amer 9 (*) >90 mL/min   GFR calc Af Amer 11 (*) >90 mL/min      MDM  Labs.  Discussed labs w renal on call, Dr Lowanda Foster. He states pt known to him, to d/c patient and that she should go for her normal hd tomorrow morning.   Recheck pt, no increased wob or sob. Pulse ox 98% room air. k normal.          Mirna Mires, MD 04/09/12 2048

## 2012-04-25 ENCOUNTER — Encounter (HOSPITAL_COMMUNITY): Payer: Self-pay | Admitting: Emergency Medicine

## 2012-04-25 ENCOUNTER — Emergency Department (HOSPITAL_COMMUNITY)
Admission: EM | Admit: 2012-04-25 | Discharge: 2012-04-25 | Disposition: A | Discharge status: Home / Self Care | Payer: Medicare Other | Attending: Emergency Medicine | Admitting: Emergency Medicine

## 2012-04-25 DIAGNOSIS — Z881 Allergy status to other antibiotic agents status: Secondary | ICD-10-CM | POA: Insufficient documentation

## 2012-04-25 DIAGNOSIS — N390 Urinary tract infection, site not specified: Secondary | ICD-10-CM | POA: Insufficient documentation

## 2012-04-25 DIAGNOSIS — Z8051 Family history of malignant neoplasm of kidney: Secondary | ICD-10-CM | POA: Insufficient documentation

## 2012-04-25 DIAGNOSIS — N186 End stage renal disease: Secondary | ICD-10-CM | POA: Insufficient documentation

## 2012-04-25 DIAGNOSIS — I120 Hypertensive chronic kidney disease with stage 5 chronic kidney disease or end stage renal disease: Secondary | ICD-10-CM | POA: Insufficient documentation

## 2012-04-25 DIAGNOSIS — Z91040 Latex allergy status: Secondary | ICD-10-CM | POA: Insufficient documentation

## 2012-04-25 DIAGNOSIS — Z91010 Allergy to peanuts: Secondary | ICD-10-CM | POA: Insufficient documentation

## 2012-04-25 DIAGNOSIS — Z888 Allergy status to other drugs, medicaments and biological substances status: Secondary | ICD-10-CM | POA: Insufficient documentation

## 2012-04-25 LAB — CBC WITH DIFFERENTIAL/PLATELET
Basophils Relative: 0 % (ref 0–1)
Eosinophils Absolute: 0.2 10*3/uL (ref 0.0–0.7)
HCT: 33.3 % — ABNORMAL LOW (ref 36.0–46.0)
Hemoglobin: 10.8 g/dL — ABNORMAL LOW (ref 12.0–15.0)
Lymphs Abs: 1 10*3/uL (ref 0.7–4.0)
MCH: 29.8 pg (ref 26.0–34.0)
MCHC: 32.4 g/dL (ref 30.0–36.0)
Monocytes Absolute: 0.9 10*3/uL (ref 0.1–1.0)
Monocytes Relative: 11 % (ref 3–12)
RBC: 3.62 MIL/uL — ABNORMAL LOW (ref 3.87–5.11)

## 2012-04-25 LAB — URINE MICROSCOPIC-ADD ON

## 2012-04-25 LAB — BASIC METABOLIC PANEL
BUN: 38 mg/dL — ABNORMAL HIGH (ref 6–23)
Chloride: 100 mEq/L (ref 96–112)
Creatinine, Ser: 4 mg/dL — ABNORMAL HIGH (ref 0.50–1.10)
GFR calc Af Amer: 18 mL/min — ABNORMAL LOW (ref >90)
GFR calc non Af Amer: 15 mL/min — ABNORMAL LOW (ref >90)
Glucose, Bld: 70 mg/dL (ref 70–99)
Potassium: 3.9 mEq/L (ref 3.5–5.1)

## 2012-04-25 LAB — URINALYSIS, ROUTINE W REFLEX MICROSCOPIC
Bilirubin Urine: NEGATIVE
Ketones, ur: NEGATIVE mg/dL
Nitrite: POSITIVE — AB
Specific Gravity, Urine: 1.02 (ref 1.005–1.030)
pH: 8.5 — ABNORMAL HIGH (ref 5.0–8.0)

## 2012-04-25 MED ORDER — CEFTRIAXONE SODIUM 1 G IJ SOLR
1.0000 g | Freq: Once | INTRAMUSCULAR | Status: DC
  Filled 2012-04-25: qty 10

## 2012-04-25 MED ORDER — SULFAMETHOXAZOLE-TRIMETHOPRIM 800-160 MG PO TABS: 1.0000 | ORAL_TABLET | Freq: Two times a day (BID) | ORAL | Status: DC

## 2012-04-25 MED ORDER — LIDOCAINE HCL (PF) 1 % IJ SOLN
2.1000 mL | Freq: Once | INTRAMUSCULAR | Status: DC
  Filled 2012-04-25: qty 5

## 2012-04-25 MED ORDER — IBUPROFEN 400 MG PO TABS
400.0000 mg | ORAL_TABLET | Freq: Once | ORAL | Status: AC
  Administered 2012-04-25: 400 mg via ORAL
  Filled 2012-04-25: qty 1

## 2012-04-25 MED ORDER — ONDANSETRON HCL 4 MG/2ML IJ SOLN
4.0000 mg | Freq: Once | INTRAMUSCULAR | Status: AC
  Administered 2012-04-25: 4 mg via INTRAVENOUS

## 2012-04-25 MED ORDER — ONDANSETRON HCL 4 MG/2ML IJ SOLN
INTRAMUSCULAR | Status: AC
  Filled 2012-04-25: qty 2

## 2012-04-25 MED ORDER — PROMETHAZINE HCL 25 MG PO TABS: 25.0000 mg | ORAL_TABLET | Freq: Four times a day (QID) | ORAL | Status: DC | PRN

## 2012-04-25 MED ORDER — DEXTROSE 5 % IV SOLN
1.0000 g | INTRAVENOUS | Status: DC
  Administered 2012-04-25: 1 g via INTRAVENOUS
  Filled 2012-04-25: qty 10

## 2012-04-25 NOTE — ED Provider Notes (Cosign Needed)
History  This chart was scribed for Jeanne Diego, MD by Kevan Rosebush. This patient was seen in room APA07/APA07 and the patient's care was started at 18:22.   CSN: [id number]  Arrival date & time 04/25/12  1518   First MD Initiated Contact with Patient 04/25/12 1822      Chief Complaint  Patient presents with  . Generalized Body Aches  . Fever  . Emesis    (Consider location/radiation/quality/duration/timing/severity/associated sxs/prior Treatment) [name] is a 21 y.o. female who presents to the Emergency Department complaining of body aches, fever, nausea, and emesis (2 episodes) since yesterday. Pt is on dialysis, Tuesday and Saturday, she doesn't make any urine. Pt has severe spinal bifida. Patient is a 21 y.o. female presenting with fever and vomiting. The history is provided by the patient. No language interpreter was used.  Fever Primary symptoms of the febrile illness include fever, fatigue, nausea and vomiting. Primary symptoms do not include myalgias or rash. The current episode started yesterday. This is a new problem. The problem has not changed since onset. The vomiting began today. Vomiting occurs 2 to 5 times per day. The emesis contains stomach contents.  Risk factors for febrile illness include new medication (rena-vite and cefuroxime axetil). Emesis  This is a new problem. The current episode started 12 to 24 hours ago. The problem occurs 2 to 4 times per day. The problem has not changed since onset.The emesis has an appearance of stomach contents. The maximum temperature recorded prior to her arrival was 100 to 100.9 F. The fever has been present for 1 to 2 days. Associated symptoms include a fever. Pertinent negatives include no myalgias.   Dr. Malka So is the pt's nephrologist, she does not have a PCP.  Past Medical History  Diagnosis Date  . Spina bifida   . UTI (lower urinary tract infection)   . HTN (hypertension) 05/02/2011  . Anemia associated  with chronic renal failure   . 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  . Blood transfusion   . Dialysis care   . ESRD (end stage renal disease) on dialysis     Past Surgical History  Procedure Date  . Av fistula placement     left arm    Family History  Problem Relation Age of Onset  . Kidney cancer Other     History  Substance Use Topics  . Smoking status: Never Smoker   . Smokeless tobacco: Not on file  . Alcohol Use: No    OB History    Grav Para Term Preterm Abortions TAB SAB Ect Mult Living                  Review of Systems  Constitutional: Positive for fever and fatigue.  Gastrointestinal: Positive for nausea and vomiting.  Musculoskeletal: Negative for myalgias.  Skin: Negative for rash.  All other systems reviewed and are negative.    Allergies  Ciprofloxacin; Other; Peanut-containing drug products; Aleve; Influenza vaccines; Tetanus toxoids; and Latex  Home Medications   Current Outpatient Rx  Name Route Sig Dispense Refill  . ACETAMINOPHEN 325 MG PO TABS Oral Take 650 mg by mouth daily as needed. For pain    . EPOGEN IJ Injection Inject as directed as needed. Sometimes patient recieves this medication at dialysis.      Triage Vitals: BP 105/58  Pulse 117  Temp 99.7 F (37.6 C)  Resp 16  Wt 61 lb (27.669 kg)  SpO2  100%  Physical Exam  Nursing note and vitals reviewed. Constitutional: She is oriented to person, place, and time. She appears well-developed. No distress.  HENT:  Head: Normocephalic and atraumatic.  Eyes: Conjunctivae normal and EOM are normal. No scleral icterus.  Neck: Neck supple. No thyromegaly present.  Cardiovascular: Normal rate and regular rhythm.  Exam reveals no gallop and no friction rub.   No murmur heard. Pulmonary/Chest: No stridor. She has no wheezes. She has no rales. She exhibits no tenderness.  Abdominal: She exhibits no distension. There is no tenderness. There is no rebound.    Musculoskeletal: Normal range of motion. She exhibits no edema.       Tender accross left lumbar sipine with curvature to lumber spine. Both legs are deformed.  Lymphadenopathy:    She has no cervical adenopathy.  Neurological: She is oriented to person, place, and time. Coordination normal.  Skin: No rash noted. No erythema.  Psychiatric: She has a normal mood and affect. Her behavior is normal.    ED Course  Procedures (including critical care time) DIAGNOSTIC STUDIES: Oxygen Saturation is 99% on room air, normal by my interpretation.    COORDINATION OF CARE: 18:26--I evaluated the patient and we discussed a treatment plan including ibuprofen and labs to which the pt agreed.   20:35--I rechecked the pt and notified her that she has a kidney infection. I told the pt that we will need to give her an antibiotic injection and that I will send her home with antibiotics as well. Pt was agreeable. Pt reports she is alellrgic to ciprofloaxin but she has never taken sulfa antibiotics.  Labs Reviewed - No data to display No results found.   No diagnosis found.    MDM        The chart was scribed for me under my direct supervision.  I personally performed the history, physical, and medical decision making and all procedures in the evaluation of this patient.Jeanne Diego, MD 04/25/12 2039  Jeanne Diego, MD 06/01/12 [phone]

## 2012-04-25 NOTE — ED Notes (Signed)
Pt asked for something to drink.  Dr. Roderic Palau asked it if was okay for her to have drink and he said she could have drink.  Pt given a coke.

## 2012-04-25 NOTE — ED Notes (Signed)
Pt c/o body aches/fever with n/v since yesterday. Pt recently started new medications-rena-vite and cefuroxime axetil.

## 2012-04-28 LAB — URINE CULTURE: Colony Count: >=100000

## 2012-04-29 NOTE — ED Notes (Signed)
+   Urine Patient treated with bactrim-sensitive to same-chart appended per protocol MD.

## 2012-06-02 NOTE — Progress Notes (Signed)
UR Chart Review Completed  

## 2012-08-20 ENCOUNTER — Emergency Department (HOSPITAL_COMMUNITY)
Admission: EM | Admit: 2012-08-20 | Discharge: 2012-08-21 | Disposition: A | Discharge status: Home / Self Care | Payer: Medicare Other | Attending: Emergency Medicine | Admitting: Emergency Medicine

## 2012-08-20 ENCOUNTER — Encounter (HOSPITAL_COMMUNITY): Payer: Self-pay | Admitting: Emergency Medicine

## 2012-08-20 DIAGNOSIS — R51 Headache: Secondary | ICD-10-CM | POA: Insufficient documentation

## 2012-08-20 DIAGNOSIS — Z992 Dependence on renal dialysis: Secondary | ICD-10-CM | POA: Insufficient documentation

## 2012-08-20 DIAGNOSIS — R079 Chest pain, unspecified: Secondary | ICD-10-CM

## 2012-08-20 DIAGNOSIS — Z862 Personal history of diseases of the blood and blood-forming organs and certain disorders involving the immune mechanism: Secondary | ICD-10-CM | POA: Insufficient documentation

## 2012-08-20 DIAGNOSIS — H5710 Ocular pain, unspecified eye: Secondary | ICD-10-CM | POA: Insufficient documentation

## 2012-08-20 DIAGNOSIS — R059 Cough, unspecified: Secondary | ICD-10-CM | POA: Insufficient documentation

## 2012-08-20 DIAGNOSIS — Z87440 Personal history of urinary (tract) infections: Secondary | ICD-10-CM | POA: Insufficient documentation

## 2012-08-20 DIAGNOSIS — I120 Hypertensive chronic kidney disease with stage 5 chronic kidney disease or end stage renal disease: Secondary | ICD-10-CM | POA: Insufficient documentation

## 2012-08-20 DIAGNOSIS — R109 Unspecified abdominal pain: Secondary | ICD-10-CM | POA: Insufficient documentation

## 2012-08-20 DIAGNOSIS — R0789 Other chest pain: Secondary | ICD-10-CM | POA: Insufficient documentation

## 2012-08-20 DIAGNOSIS — H538 Other visual disturbances: Secondary | ICD-10-CM | POA: Insufficient documentation

## 2012-08-20 DIAGNOSIS — Q059 Spina bifida, unspecified: Secondary | ICD-10-CM | POA: Insufficient documentation

## 2012-08-20 DIAGNOSIS — Z8739 Personal history of other diseases of the musculoskeletal system and connective tissue: Secondary | ICD-10-CM | POA: Insufficient documentation

## 2012-08-20 DIAGNOSIS — N19 Unspecified kidney failure: Secondary | ICD-10-CM

## 2012-08-20 DIAGNOSIS — Z3202 Encounter for pregnancy test, result negative: Secondary | ICD-10-CM | POA: Insufficient documentation

## 2012-08-20 DIAGNOSIS — Z79899 Other long term (current) drug therapy: Secondary | ICD-10-CM | POA: Insufficient documentation

## 2012-08-20 DIAGNOSIS — R05 Cough: Secondary | ICD-10-CM | POA: Insufficient documentation

## 2012-08-20 DIAGNOSIS — R1110 Vomiting, unspecified: Secondary | ICD-10-CM | POA: Insufficient documentation

## 2012-08-20 NOTE — ED Notes (Signed)
Patient complaining of headache with blurred vision that started earlier today. Patient also reports abdominal pain and chest pain x approximately 1 week.

## 2012-08-21 ENCOUNTER — Emergency Department (HOSPITAL_COMMUNITY): Payer: Medicare Other

## 2012-08-21 ENCOUNTER — Other Ambulatory Visit: Payer: Self-pay

## 2012-08-21 LAB — CBC WITH DIFFERENTIAL/PLATELET
Eosinophils Absolute: 0.2 10*3/uL (ref 0.0–0.7)
Eosinophils Relative: 3 % (ref 0–5)
HCT: 32.4 % — ABNORMAL LOW (ref 36.0–46.0)
Hemoglobin: 10.8 g/dL — ABNORMAL LOW (ref 12.0–15.0)
Lymphocytes Relative: 26 % (ref 12–46)
Lymphs Abs: 2.1 10*3/uL (ref 0.7–4.0)
MCH: 30.4 pg (ref 26.0–34.0)
MCV: 91.3 fL (ref 78.0–100.0)
Monocytes Relative: 7 % (ref 3–12)
Platelets: 247 10*3/uL (ref 150–400)
RBC: 3.55 MIL/uL — ABNORMAL LOW (ref 3.87–5.11)
WBC: 7.9 10*3/uL (ref 4.0–10.5)

## 2012-08-21 LAB — URINALYSIS, ROUTINE W REFLEX MICROSCOPIC
Glucose, UA: NEGATIVE mg/dL
Ketones, ur: NEGATIVE mg/dL
Protein, ur: 100 mg/dL — AB
pH: 7.5 (ref 5.0–8.0)

## 2012-08-21 LAB — URINE MICROSCOPIC-ADD ON

## 2012-08-21 LAB — COMPREHENSIVE METABOLIC PANEL
ALT: 9 U/L (ref 0–35)
AST: 17 U/L (ref 0–37)
Albumin: 3.6 g/dL (ref 3.5–5.2)
CO2: 23 mEq/L (ref 19–32)
Calcium: 10.4 mg/dL (ref 8.4–10.5)
Creatinine, Ser: 6.27 mg/dL — ABNORMAL HIGH (ref 0.50–1.10)
GFR calc non Af Amer: 9 mL/min — ABNORMAL LOW (ref >90)
Sodium: 137 mEq/L (ref 135–145)
Total Protein: 8.1 g/dL (ref 6.0–8.3)

## 2012-08-21 LAB — LACTIC ACID, PLASMA: Lactic Acid, Venous: 0.9 mmol/L (ref 0.5–2.2)

## 2012-08-21 MED ORDER — MORPHINE SULFATE 2 MG/ML IJ SOLN
2.0000 mg | Freq: Once | INTRAMUSCULAR | Status: AC
  Administered 2012-08-21: 2 mg via INTRAVENOUS
  Filled 2012-08-21: qty 1

## 2012-08-21 MED ORDER — ONDANSETRON HCL 4 MG/2ML IJ SOLN
4.0000 mg | Freq: Once | INTRAMUSCULAR | Status: DC
  Filled 2012-08-21: qty 2

## 2012-08-21 NOTE — ED Provider Notes (Signed)
History     CSN: [id number]  Arrival date & time 08/20/12  2335   First MD Initiated Contact with Patient 08/20/12 2344      Chief Complaint  Patient presents with  . Abdominal Pain  . Headache    Patient is a 22 y.o. female presenting with abdominal pain.  Abdominal Pain The primary symptoms of the illness include abdominal pain. The primary symptoms of the illness do not include fever. The current episode started more than 2 days ago. The onset of the illness was gradual. The problem has been gradually worsening.  Symptoms associated with the illness do not include back pain.  Pt presents for abdominal pain.  She reports abd pain for over a week with associated vomiting.  No fevers are reported. She reports it is located in the upper abdomen.  She is a dialysis patient (two days a week - Tuesday/saturday) but still produces urine  Pt also reports chest pain.  She reports recent cough.  No SOB is reported.  She reports she has had chest wall pain before from previous dialysis catheters in her chest wall.  She also reports headache with blurred vision and "eyes burning" but no new weakness.  She reports these headaches are not new and she gets them frequently  Past Medical History  Diagnosis Date  . Spina bifida   . UTI (lower urinary tract infection)   . HTN (hypertension) 05/02/2011  . Anemia associated with chronic renal failure   . 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  . Blood transfusion   . Dialysis care   . ESRD (end stage renal disease) on dialysis     Past Surgical History  Procedure Date  . Av fistula placement     left arm    Family History  Problem Relation Age of Onset  . Kidney cancer Other     History  Substance Use Topics  . Smoking status: Never Smoker   . Smokeless tobacco: Not on file  . Alcohol Use: No    OB History    Grav Para Term Preterm Abortions TAB SAB Ect Mult Living                  Review of Systems    Constitutional: Negative for fever.  Cardiovascular: Positive for chest pain.  Gastrointestinal: Positive for abdominal pain.  Musculoskeletal: Negative for back pain.  Neurological: Negative for weakness.  Psychiatric/Behavioral: Negative for agitation.  All other systems reviewed and are negative.    Allergies  Ciprofloxacin; Other; Peanut-containing drug products; Aleve; Influenza vaccines; Tetanus toxoids; and Latex  Home Medications   Current Outpatient Rx  Name  Route  Sig  Dispense  Refill  . CEFUROXIME AXETIL 250 MG PO TABS   Oral   Take 250 mg by mouth 2 (two) times daily.         . EPOGEN IJ   Injection   Inject as directed as needed. Sometimes patient recieves this medication at dialysis.         Marland Kitchen RENA-VITE PO TABS   Oral   Take 1 tablet by mouth daily.         Marland Kitchen PROMETHAZINE HCL 25 MG PO TABS   Oral   Take 1 tablet (25 mg total) by mouth every 6 (six) hours as needed for nausea.   15 tablet   0   . SULFAMETHOXAZOLE-TRIMETHOPRIM 800-160 MG PO TABS   Oral   Take 1 tablet  by mouth every 12 (twelve) hours.   10 tablet   0     BP 133/78  Pulse 104  Temp 98.9 F (37.2 C) (Oral)  Resp 16  SpO2 100%  Physical Exam CONSTITUTIONAL: thin appearing female in no distress HEAD AND FACE: Normocephalic/atraumatic EYES: EOMI, no icterus ENMT: Mucous membranes moist NECK: supple no meningeal signs CV: S1/S2 noted, no murmurs/rubs/gallops noted Chest - healed scar to right upper chest.  Nontender.  No crepitance or bruising LUNGS: Lungs are clear to auscultation bilaterally, no apparent distress ABDOMEN: soft, epigastric tenderness.  Tenderness is moderate, no rebound or guarding NEURO: Pt is awake/alert, moves all extremitiesx4 EXTREMITIES: chronic LE contractures noted Dialysis access to left UE - thrill noted SKIN: warm, color normal PSYCH: no abnormalities of mood noted  ED Course  Procedures   Labs Reviewed  COMPREHENSIVE METABOLIC PANEL   LIPASE, BLOOD  CBC WITH DIFFERENTIAL  URINALYSIS, ROUTINE W REFLEX MICROSCOPIC  LACTIC ACID, PLASMA   12:11 AM Pt presents for multiple complaints, but she feels her main issue is abd pain for a week.  She is in no distress currently.  Will initiate labs/cxr/ekg.  Will follow closely 1:10 AM Pt resting comfortably.  She is well appearing, watching TV no distress, reports her pain is essentially resolved. 1:39 AM Pt resting, watching TV.  Her abdomen is soft without any focal tenderness.  She now has chest wall tenderness.  She is using phone and watching TV through most of her repeat exam.  My suspicion for acute cardiovascular/abdominal process is low given appearance.  Doubt PE as cause of CP as pain now reproducible.  She will f/u for dialysis in the morning  MDM  Nursing notes including past medical history and social history reviewed and considered in documentation xrays reviewed and considered Labs/vital reviewed and considered Previous records reviewed and considered - previous admissions reviewed    Date: 08/21/2012  Rate: 108  Rhythm: sinus tachycardia  QRS Axis: normal  Intervals: normal  ST/T Wave abnormalities: normal  Conduction Disutrbances:none  Narrative Interpretation:   Old EKG Reviewed: unchanged           Sharyon Cable, MD 08/21/12 [phone]

## 2012-08-24 LAB — URINE CULTURE

## 2012-08-27 NOTE — ED Notes (Signed)
Patient contacted and informed to follow up with PCP.

## 2013-04-11 ENCOUNTER — Emergency Department (HOSPITAL_COMMUNITY): Payer: Medicare Other

## 2013-04-11 ENCOUNTER — Emergency Department (HOSPITAL_COMMUNITY)
Admission: EM | Admit: 2013-04-11 | Discharge: 2013-04-11 | Disposition: A | Discharge status: Home / Self Care | Payer: Medicare Other | Attending: Emergency Medicine | Admitting: Emergency Medicine

## 2013-04-11 ENCOUNTER — Encounter (HOSPITAL_COMMUNITY): Payer: Self-pay | Admitting: *Deleted

## 2013-04-11 DIAGNOSIS — Z862 Personal history of diseases of the blood and blood-forming organs and certain disorders involving the immune mechanism: Secondary | ICD-10-CM | POA: Insufficient documentation

## 2013-04-11 DIAGNOSIS — M545 Low back pain, unspecified: Secondary | ICD-10-CM | POA: Insufficient documentation

## 2013-04-11 DIAGNOSIS — R51 Headache: Secondary | ICD-10-CM | POA: Insufficient documentation

## 2013-04-11 DIAGNOSIS — N186 End stage renal disease: Secondary | ICD-10-CM | POA: Insufficient documentation

## 2013-04-11 DIAGNOSIS — Z91040 Latex allergy status: Secondary | ICD-10-CM | POA: Insufficient documentation

## 2013-04-11 DIAGNOSIS — I120 Hypertensive chronic kidney disease with stage 5 chronic kidney disease or end stage renal disease: Secondary | ICD-10-CM | POA: Insufficient documentation

## 2013-04-11 DIAGNOSIS — R509 Fever, unspecified: Secondary | ICD-10-CM | POA: Insufficient documentation

## 2013-04-11 DIAGNOSIS — Z79899 Other long term (current) drug therapy: Secondary | ICD-10-CM | POA: Insufficient documentation

## 2013-04-11 DIAGNOSIS — N390 Urinary tract infection, site not specified: Secondary | ICD-10-CM | POA: Insufficient documentation

## 2013-04-11 DIAGNOSIS — Z992 Dependence on renal dialysis: Secondary | ICD-10-CM | POA: Insufficient documentation

## 2013-04-11 DIAGNOSIS — Z87440 Personal history of urinary (tract) infections: Secondary | ICD-10-CM | POA: Insufficient documentation

## 2013-04-11 DIAGNOSIS — Z8739 Personal history of other diseases of the musculoskeletal system and connective tissue: Secondary | ICD-10-CM | POA: Insufficient documentation

## 2013-04-11 LAB — CBC WITH DIFFERENTIAL/PLATELET
Hemoglobin: 11.5 g/dL — ABNORMAL LOW (ref 12.0–15.0)
Lymphocytes Relative: 32 % (ref 12–46)
Lymphs Abs: 1.1 10*3/uL (ref 0.7–4.0)
Monocytes Relative: 15 % — ABNORMAL HIGH (ref 3–12)
Neutro Abs: 1.6 10*3/uL — ABNORMAL LOW (ref 1.7–7.7)
Neutrophils Relative %: 47 % (ref 43–77)
Platelets: 176 10*3/uL (ref 150–400)
RBC: 3.93 MIL/uL (ref 3.87–5.11)
WBC: 3.4 10*3/uL — ABNORMAL LOW (ref 4.0–10.5)

## 2013-04-11 LAB — BASIC METABOLIC PANEL
CO2: 25 mEq/L (ref 19–32)
Chloride: 101 mEq/L (ref 96–112)
GFR calc non Af Amer: 11 mL/min — ABNORMAL LOW (ref >90)
Glucose, Bld: 80 mg/dL (ref 70–99)
Potassium: 3.8 mEq/L (ref 3.5–5.1)
Sodium: 138 mEq/L (ref 135–145)

## 2013-04-11 LAB — URINE MICROSCOPIC-ADD ON

## 2013-04-11 LAB — URINALYSIS, ROUTINE W REFLEX MICROSCOPIC
Glucose, UA: NEGATIVE mg/dL
Specific Gravity, Urine: 1.015 (ref 1.005–1.030)
Urobilinogen, UA: 0.2 mg/dL (ref 0.0–1.0)
pH: 8 (ref 5.0–8.0)

## 2013-04-11 MED ORDER — HYDROCODONE-ACETAMINOPHEN 5-325 MG PO TABS
1.0000 | ORAL_TABLET | Freq: Once | ORAL | Status: AC
  Administered 2013-04-11: 1 via ORAL
  Filled 2013-04-11: qty 1

## 2013-04-11 MED ORDER — ONDANSETRON 4 MG PO TBDP
4.0000 mg | ORAL_TABLET | Freq: Once | ORAL | Status: AC
  Administered 2013-04-11: 4 mg via ORAL
  Filled 2013-04-11: qty 1

## 2013-04-11 MED ORDER — DIPHENHYDRAMINE HCL 25 MG PO CAPS
ORAL_CAPSULE | ORAL | Status: AC
  Filled 2013-04-11: qty 1

## 2013-04-11 MED ORDER — PROMETHAZINE HCL 25 MG PO TABS: 25.0000 mg | ORAL_TABLET | Freq: Four times a day (QID) | ORAL | Status: DC | PRN

## 2013-04-11 MED ORDER — DIPHENHYDRAMINE HCL 25 MG PO CAPS
25.0000 mg | ORAL_CAPSULE | Freq: Once | ORAL | Status: AC
  Administered 2013-04-11: 25 mg via ORAL

## 2013-04-11 MED ORDER — ONDANSETRON HCL 4 MG/2ML IJ SOLN
4.0000 mg | Freq: Once | INTRAMUSCULAR | Status: AC
  Administered 2013-04-11: 4 mg via INTRAVENOUS

## 2013-04-11 MED ORDER — CEFTRIAXONE SODIUM 1 G IJ SOLR
1.0000 g | Freq: Once | INTRAMUSCULAR | Status: AC
  Administered 2013-04-11: 1 g via INTRAVENOUS
  Filled 2013-04-11: qty 10

## 2013-04-11 MED ORDER — SULFAMETHOXAZOLE-TRIMETHOPRIM 800-160 MG PO TABS: 1.0000 | ORAL_TABLET | Freq: Two times a day (BID) | ORAL | Status: DC

## 2013-04-11 MED ORDER — ONDANSETRON HCL 4 MG/2ML IJ SOLN
INTRAMUSCULAR | Status: AC
  Filled 2013-04-11: qty 2

## 2013-04-11 NOTE — ED Notes (Signed)
Headache, vomiting, back pain, Had blood in stool today x1,  Alert, talking, Has spina bifida , on dialysis,  Tues /saturday.

## 2013-04-11 NOTE — ED Notes (Signed)
After giving zofran pt developed rash to her rt arm.  Notified edp

## 2013-04-11 NOTE — ED Provider Notes (Signed)
CSN: [id number]     Arrival date & time 04/11/13  1212 History  This chart was scribed for Jeanne Diego, MD by Roxan Diesel, ED scribe.  This patient was seen in room APA11/[facility]11 and the patient's care was started at 1:44 PM.   Chief Complaint  Patient presents with  . Back Pain   Patient is a 22 y.o. female presenting with back pain. The history is provided by the patient. No language interpreter was used.  Back Pain Location:  Lumbar spine and thoracic spine Pain severity:  Moderate Duration: Today. Timing:  Constant Ineffective treatments: ibuprofen and Tylenol. Associated symptoms: fever (subjective) and headaches   Associated symptoms: no chest pain   Fever:    Temp source:  Subjective Risk factors comment:  Spina bifida   HPI Comments: Madalee A Peterman is a 22 y.o. female with h/o spina bifida, caudal regression syndrome, ESRD on dialysis, anemia, and HTN who presents to the Emergency Department complaining of constant upper and lower back pain that began this morning with associated subjective fever, chills, nausea, and 2 episodes of vomiting.  Pt also notes that she has had a generalized headache for one week that began before onset of other symptoms.  Headache is worse on the right side.  She has taken ibuprofen and Tyelon, without relief.  She denies diarrhea or cough.  She is a [place] dialysis patient.  Pt has no PCP   Past Medical History  Diagnosis Date  . Spina bifida   . UTI (lower urinary tract infection)   . HTN (hypertension) 05/02/2011  . Anemia associated with chronic renal failure   . 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  . Blood transfusion   . Dialysis care   . ESRD (end stage renal disease) on dialysis     Past Surgical History  Procedure Laterality Date  . Av fistula placement      left arm    Family History  Problem Relation Age of Onset  . Kidney cancer Other     History  Substance Use Topics  . Smoking  status: Never Smoker   . Smokeless tobacco: Not on file  . Alcohol Use: No    OB History   Grav Para Term Preterm Abortions TAB SAB Ect Mult Living                  Review of Systems  Constitutional: Positive for fever (subjective) and chills. Negative for appetite change and fatigue.  HENT: Negative for congestion, sinus pressure and ear discharge.   Eyes: Negative for discharge.  Respiratory: Negative for cough.   Cardiovascular: Negative for chest pain.  Gastrointestinal: Positive for nausea and vomiting.  Genitourinary: Negative for frequency and hematuria.  Musculoskeletal: Positive for back pain.  Skin: Negative for rash.  Neurological: Positive for headaches. Negative for seizures.  Psychiatric/Behavioral: Negative for hallucinations.     Allergies  Ciprofloxacin; Other; Peanut-containing drug products; Aleve; Influenza vaccines; Tetanus toxoids; and Latex  Home Medications   Current Outpatient Rx  Name  Route  Sig  Dispense  Refill  . cefUROXime (CEFTIN) 250 MG tablet   Oral   Take 250 mg by mouth 2 (two) times daily.         Marland Kitchen Epoetin Alfa (EPOGEN IJ)   Injection   Inject as directed as needed. Sometimes patient recieves this medication at dialysis.         Marland Kitchen multivitamin (RENA-VIT) TABS tablet   Oral  Take 1 tablet by mouth daily.         . promethazine (PHENERGAN) 25 MG tablet   Oral   Take 1 tablet (25 mg total) by mouth every 6 (six) hours as needed for nausea.   15 tablet   0   . sulfamethoxazole-trimethoprim (SEPTRA DS) 800-160 MG per tablet   Oral   Take 1 tablet by mouth every 12 (twelve) hours.   10 tablet   0    BP 123/80  Pulse 91  Temp(Src) 98.6 F (37 C) (Oral)  Resp 17  Wt 60 lb (27.216 kg)  BMI 32.58 kg/m2  SpO2 99%  Physical Exam  Nursing note and vitals reviewed. Constitutional: She is oriented to person, place, and time.  HENT:  Head: Normocephalic.  Eyes: Conjunctivae and EOM are normal. No scleral icterus.   Neck: Neck supple. No thyromegaly present.  Cardiovascular: Normal rate and regular rhythm.  Exam reveals no gallop and no friction rub.   No murmur heard. Pulmonary/Chest: No stridor. She has no wheezes. She has no rales. She exhibits no tenderness.  Abdominal: She exhibits no distension. There is no tenderness. There is no rebound.  Musculoskeletal: Normal range of motion. She exhibits tenderness. She exhibits no edema.       Lumbar back: She exhibits deformity.  Dialysis graft in left forearm Tenderness to thoracic and lumbar spine Deformity to lumbar spine Legs are malformed, nonfunctional, and very small  Lymphadenopathy:    She has no cervical adenopathy.  Neurological: She is oriented to person, place, and time. Coordination normal.  Skin: No rash noted. No erythema.  Psychiatric: She has a normal mood and affect. Her behavior is normal.    ED Course  Procedures (including critical care time)  DIAGNOSTIC STUDIES: Oxygen Saturation is 99% on room air, normal by my interpretation.    COORDINATION OF CARE: 1:49 PM-Discussed treatment plan which includes anti-emetics, CXR and labs with pt at bedside and pt agreed to plan.    Labs Review Labs Reviewed  CBC WITH DIFFERENTIAL - Abnormal; Notable for the following:    WBC 3.4 (*)    Hemoglobin 11.5 (*)    Neutro Abs 1.6 (*)    Monocytes Relative 15 (*)    Eosinophils Relative 6 (*)    All other components within normal limits  BASIC METABOLIC PANEL - Abnormal; Notable for the following:    BUN 37 (*)    Creatinine, Ser 5.35 (*)    GFR calc non Af Amer 11 (*)    GFR calc Af Amer 12 (*)    All other components within normal limits  URINALYSIS, ROUTINE W REFLEX MICROSCOPIC - Abnormal; Notable for the following:    APPearance CLOUDY (*)    Hgb urine dipstick MODERATE (*)    Protein, ur 100 (*)    Leukocytes, UA MODERATE (*)    All other components within normal limits  URINE MICROSCOPIC-ADD ON - Abnormal; Notable for the  following:    Squamous Epithelial / LPF FEW (*)    Bacteria, UA MANY (*)    All other components within normal limits  URINE CULTURE    Imaging Review Dg Chest 2 View  04/11/2013   *RADIOLOGY REPORT*  Clinical Data: Spina bifida, end-stage renal disease, vomiting  CHEST - 2 VIEW  Comparison: 08/21/2012.  Findings:  Normal heart size and vascularity.  Mild hyperinflation without focal airspace process, pneumonia, collapse or consolidation.  No large effusion or pneumothorax.  Trachea midline.  Spinal  deformity as before.  IMPRESSION: Hyperinflation.  No acute finding.   Original Report Authenticated By: Jerilynn Mages. Shick, M.D.     MDM  No diagnosis found.     The chart was scribed for me under my direct supervision.  I personally performed the history, physical, and medical decision making and all procedures in the evaluation of this patient.Jeanne Diego, MD 04/11/13 [phone]

## 2013-04-11 NOTE — ED Notes (Signed)
Pt actively vomiting.  Notified edp

## 2013-04-11 NOTE — ED Notes (Signed)
nad noted prior to dc dc instructions reviewed with pt and explained., 2 scripts given along with f/u appt

## 2013-04-13 LAB — URINE CULTURE

## 2013-05-04 ENCOUNTER — Emergency Department (HOSPITAL_COMMUNITY)
Admission: EM | Admit: 2013-05-04 | Discharge: 2013-05-04 | Disposition: A | Discharge status: Home / Self Care | Payer: Medicare Other | Source: Home / Self Care | Attending: Emergency Medicine | Admitting: Emergency Medicine

## 2013-05-04 ENCOUNTER — Emergency Department (HOSPITAL_COMMUNITY): Payer: Medicare Other

## 2013-05-04 DIAGNOSIS — Z87798 Personal history of other (corrected) congenital malformations: Secondary | ICD-10-CM | POA: Insufficient documentation

## 2013-05-04 DIAGNOSIS — E876 Hypokalemia: Secondary | ICD-10-CM | POA: Insufficient documentation

## 2013-05-04 DIAGNOSIS — N186 End stage renal disease: Secondary | ICD-10-CM | POA: Insufficient documentation

## 2013-05-04 DIAGNOSIS — Z79899 Other long term (current) drug therapy: Secondary | ICD-10-CM | POA: Insufficient documentation

## 2013-05-04 DIAGNOSIS — Z91040 Latex allergy status: Secondary | ICD-10-CM | POA: Insufficient documentation

## 2013-05-04 DIAGNOSIS — R51 Headache: Secondary | ICD-10-CM | POA: Insufficient documentation

## 2013-05-04 DIAGNOSIS — Z992 Dependence on renal dialysis: Secondary | ICD-10-CM | POA: Insufficient documentation

## 2013-05-04 DIAGNOSIS — Z862 Personal history of diseases of the blood and blood-forming organs and certain disorders involving the immune mechanism: Secondary | ICD-10-CM | POA: Insufficient documentation

## 2013-05-04 DIAGNOSIS — I6350 Cerebral infarction due to unspecified occlusion or stenosis of unspecified cerebral artery: Secondary | ICD-10-CM | POA: Insufficient documentation

## 2013-05-04 DIAGNOSIS — N390 Urinary tract infection, site not specified: Secondary | ICD-10-CM

## 2013-05-04 DIAGNOSIS — R112 Nausea with vomiting, unspecified: Secondary | ICD-10-CM | POA: Insufficient documentation

## 2013-05-04 DIAGNOSIS — Z3202 Encounter for pregnancy test, result negative: Secondary | ICD-10-CM | POA: Insufficient documentation

## 2013-05-04 LAB — COMPREHENSIVE METABOLIC PANEL
BUN: 18 mg/dL (ref 6–23)
Calcium: 9.2 mg/dL (ref 8.4–10.5)
GFR calc Af Amer: 21 mL/min — ABNORMAL LOW (ref >90)
Glucose, Bld: 83 mg/dL (ref 70–99)
Total Protein: 7.9 g/dL (ref 6.0–8.3)

## 2013-05-04 LAB — POCT PREGNANCY, URINE: Preg Test, Ur: NEGATIVE

## 2013-05-04 LAB — URINALYSIS, ROUTINE W REFLEX MICROSCOPIC
Bilirubin Urine: NEGATIVE
Ketones, ur: NEGATIVE mg/dL
Specific Gravity, Urine: 1.02 (ref 1.005–1.030)
Urobilinogen, UA: 0.2 mg/dL (ref 0.0–1.0)

## 2013-05-04 LAB — CBC WITH DIFFERENTIAL/PLATELET
Basophils Relative: 0 % (ref 0–1)
Eosinophils Absolute: 0.3 10*3/uL (ref 0.0–0.7)
Eosinophils Relative: 6 % — ABNORMAL HIGH (ref 0–5)
Hemoglobin: 11.6 g/dL — ABNORMAL LOW (ref 12.0–15.0)
Lymphs Abs: 1.8 10*3/uL (ref 0.7–4.0)
MCH: 30.1 pg (ref 26.0–34.0)
MCHC: 32.6 g/dL (ref 30.0–36.0)
MCV: 92.2 fL (ref 78.0–100.0)
Monocytes Relative: 16 % — ABNORMAL HIGH (ref 3–12)
Platelets: 255 10*3/uL (ref 150–400)
RBC: 3.86 MIL/uL — ABNORMAL LOW (ref 3.87–5.11)

## 2013-05-04 LAB — URINE MICROSCOPIC-ADD ON

## 2013-05-04 MED ORDER — HYDROMORPHONE HCL PF 1 MG/ML IJ SOLN
INTRAMUSCULAR | Status: AC
  Administered 2013-05-04: 01:00:00
  Filled 2013-05-04: qty 1

## 2013-05-04 MED ORDER — SULFAMETHOXAZOLE-TMP DS 800-160 MG PO TABS
1.0000 | ORAL_TABLET | Freq: Once | ORAL | Status: AC
  Administered 2013-05-04: 1 via ORAL
  Filled 2013-05-04: qty 1

## 2013-05-04 MED ORDER — ONDANSETRON 4 MG PO TBDP
ORAL_TABLET | ORAL | Status: AC
  Administered 2013-05-04: 01:00:00
  Filled 2013-05-04: qty 1

## 2013-05-04 MED ORDER — SULFAMETHOXAZOLE-TMP DS 800-160 MG PO TABS: 1.0000 | ORAL_TABLET | Freq: Two times a day (BID) | ORAL | Status: DC

## 2013-05-04 MED ORDER — POTASSIUM CHLORIDE CRYS ER 20 MEQ PO TBCR
40.0000 meq | EXTENDED_RELEASE_TABLET | Freq: Once | ORAL | Status: AC
  Administered 2013-05-04: 40 meq via ORAL
  Filled 2013-05-04: qty 2

## 2013-05-04 NOTE — ED Notes (Signed)
Pt reporting back pain and headache

## 2013-05-04 NOTE — ED Provider Notes (Signed)
CSN: [id number]     Arrival date & time 05/04/13  0017 History   First MD Initiated Contact with Patient 05/04/13 0242     Chief Complaint  Patient presents with  . Back Pain  . Headache   (Consider location/radiation/quality/duration/timing/severity/associated sxs/prior Treatment) HPI Patient presents with concern of generalized discomfort, mild nausea. Patient was in her usual state of health prior to dialysis.  Completion of dialysis was followed soon thereafter by generalized discomfort, nausea.  There is no new dyspnea, cough, swelling, rash.  No relief with anything.  Nausea has been persistent/increasing with 1 episode of vomiting. No clear alleviating or exacerbating factors.  Past Medical History  Diagnosis Date  . Spina bifida   . UTI (lower urinary tract infection)   . HTN (hypertension) 05/02/2011  . Anemia associated with chronic renal failure   . 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  . Blood transfusion   . Dialysis care   . ESRD (end stage renal disease) on dialysis    Past Surgical History  Procedure Laterality Date  . Av fistula placement      left arm   Family History  Problem Relation Age of Onset  . Kidney cancer Other    History  Substance Use Topics  . Smoking status: Never Smoker   . Smokeless tobacco: Not on file  . Alcohol Use: No   OB History   Grav Para Term Preterm Abortions TAB SAB Ect Mult Living                 Review of Systems  Constitutional: Positive for fatigue. Negative for fever, chills and appetite change.  HENT: Negative for congestion, ear discharge and sinus pressure.   Eyes: Negative for discharge.  Respiratory: Negative for cough.   Cardiovascular: Negative for chest pain.  Gastrointestinal: Positive for nausea and vomiting.  Genitourinary: Negative for frequency and hematuria.  Musculoskeletal: Positive for back pain.  Skin: Negative for rash.  Neurological: Positive for headaches. Negative for  seizures.  Psychiatric/Behavioral: Negative for hallucinations.    Allergies  Ciprofloxacin; Other; Peanut-containing drug products; Aleve; Influenza vaccines; Tetanus toxoids; and Latex  Home Medications   Current Outpatient Rx  Name  Route  Sig  Dispense  Refill  . cinacalcet (SENSIPAR) 30 MG tablet   Oral   Take 30 mg by mouth daily.         Marland Kitchen ibuprofen (ADVIL,MOTRIN) 200 MG tablet   Oral   Take 600 mg by mouth every 6 (six) hours as needed for pain.         . promethazine (PHENERGAN) 25 MG tablet   Oral   Take 1 tablet (25 mg total) by mouth every 6 (six) hours as needed for nausea.   15 tablet   0   . sulfamethoxazole-trimethoprim (SEPTRA DS) 800-160 MG per tablet   Oral   Take 1 tablet by mouth 2 (two) times daily.   14 tablet   0   . Vitamin D, Ergocalciferol, (DRISDOL) 50000 UNITS CAPS capsule   Oral   Take 50,000 Units by mouth every 7 (seven) days. Takes on Thursday          BP 117/70  Pulse 99  Temp(Src) 99 F (37.2 C) (Oral)  Resp 20  SpO2 97% Physical Exam  Nursing note and vitals reviewed. Constitutional: She is oriented to person, place, and time.  HENT:  Head: Normocephalic.  Eyes: Conjunctivae and EOM are normal. No scleral icterus.  Neck: Neck supple. No thyromegaly present.  Cardiovascular: Normal rate and regular rhythm.  Exam reveals no gallop and no friction rub.   No murmur heard. Pulmonary/Chest: No stridor. She has no wheezes. She has no rales. She exhibits no tenderness.  Abdominal: She exhibits no distension. There is no tenderness. There is no rebound.  Musculoskeletal: Normal range of motion. She exhibits tenderness. She exhibits no edema.       Lumbar back: She exhibits deformity.  Dialysis graft in left forearm Tenderness to thoracic and lumbar spine Deformity to lumbar spine Legs are malformed, nonfunctional, and very small  Lymphadenopathy:    She has no cervical adenopathy.  Neurological: She is oriented to person,  place, and time. Coordination normal.  Skin: No rash noted. No erythema.  Psychiatric: She has a normal mood and affect. Her behavior is normal.    ED Course  Procedures (including critical care time) Labs Review Labs Reviewed  URINALYSIS, ROUTINE W REFLEX MICROSCOPIC - Abnormal; Notable for the following:    APPearance CLOUDY (*)    Hgb urine dipstick MODERATE (*)    Protein, ur 100 (*)    Leukocytes, UA MODERATE (*)    All other components within normal limits  CBC WITH DIFFERENTIAL - Abnormal; Notable for the following:    RBC 3.86 (*)    Hemoglobin 11.6 (*)    HCT 35.6 (*)    Neutrophils Relative % 42 (*)    Monocytes Relative 16 (*)    Eosinophils Relative 6 (*)    All other components within normal limits  COMPREHENSIVE METABOLIC PANEL - Abnormal; Notable for the following:    Potassium 3.0 (*)    Creatinine, Ser 3.37 (*)    Alkaline Phosphatase 146 (*)    Total Bilirubin 0.2 (*)    GFR calc non Af Amer 19 (*)    GFR calc Af Amer 21 (*)    All other components within normal limits  URINE MICROSCOPIC-ADD ON - Abnormal; Notable for the following:    Bacteria, UA MANY (*)    All other components within normal limits  URINE CULTURE  URINE CULTURE  CBC WITH DIFFERENTIAL  COMPREHENSIVE METABOLIC PANEL  URINALYSIS, ROUTINE W REFLEX MICROSCOPIC  POCT PREGNANCY, URINE   Imaging Review No results found. 3:39 AM Patient sleeping.  She was awakened to discuss results.  She remains hemodynamically stable the MDM  No diagnosis found.  this young female with multiple medical problems, prominently spina bifida, dialysis, now presents with generalized discomfort.  On exam she is awake and alert, hemodynamically stable, afebrile.  Patient's evaluation demonstrates concern for both urinary tract infection, possible bronchitis.  Patient endorses a family member with ongoing pneumonia.  The patient remained hemodynamically stable, in no distress throughout the monitoring in her  emergency department stay.  Patient was started on antibiotics, discharged to follow up with her primary care physician.  Potassium was provided as well.    Carmin Muskrat, MD 05/04/13 [phone]

## 2013-05-04 NOTE — ED Notes (Signed)
Pt arrived during system downtime.  Refer to manual chart for additional information if necessary.

## 2013-05-04 NOTE — ED Notes (Signed)
C/o feeling hot - requested a cool cloth - given  Temp checked  99.0

## 2013-05-05 ENCOUNTER — Encounter (HOSPITAL_COMMUNITY): Payer: Self-pay | Admitting: Emergency Medicine

## 2013-05-05 ENCOUNTER — Inpatient Hospital Stay (HOSPITAL_COMMUNITY)
Admission: EM | Admit: 2013-05-05 | Discharge: 2013-05-06 | DRG: 689 | Disposition: A | Discharge status: Home / Self Care | Payer: Medicare Other | Attending: Internal Medicine | Admitting: Internal Medicine

## 2013-05-05 DIAGNOSIS — R197 Diarrhea, unspecified: Secondary | ICD-10-CM

## 2013-05-05 DIAGNOSIS — N319 Neuromuscular dysfunction of bladder, unspecified: Secondary | ICD-10-CM | POA: Diagnosis present

## 2013-05-05 DIAGNOSIS — D631 Anemia in chronic kidney disease: Secondary | ICD-10-CM | POA: Diagnosis present

## 2013-05-05 DIAGNOSIS — E872 Acidosis, unspecified: Secondary | ICD-10-CM

## 2013-05-05 DIAGNOSIS — Z8051 Family history of malignant neoplasm of kidney: Secondary | ICD-10-CM

## 2013-05-05 DIAGNOSIS — Q059 Spina bifida, unspecified: Secondary | ICD-10-CM

## 2013-05-05 DIAGNOSIS — E876 Hypokalemia: Secondary | ICD-10-CM

## 2013-05-05 DIAGNOSIS — R109 Unspecified abdominal pain: Secondary | ICD-10-CM

## 2013-05-05 DIAGNOSIS — N390 Urinary tract infection, site not specified: Principal | ICD-10-CM

## 2013-05-05 DIAGNOSIS — Z5900 Homelessness unspecified: Secondary | ICD-10-CM

## 2013-05-05 DIAGNOSIS — I120 Hypertensive chronic kidney disease with stage 5 chronic kidney disease or end stage renal disease: Secondary | ICD-10-CM | POA: Diagnosis present

## 2013-05-05 DIAGNOSIS — R112 Nausea with vomiting, unspecified: Secondary | ICD-10-CM

## 2013-05-05 DIAGNOSIS — Z992 Dependence on renal dialysis: Secondary | ICD-10-CM

## 2013-05-05 DIAGNOSIS — R000 Tachycardia, unspecified: Secondary | ICD-10-CM

## 2013-05-05 DIAGNOSIS — Z87440 Personal history of urinary (tract) infections: Secondary | ICD-10-CM

## 2013-05-05 DIAGNOSIS — D649 Anemia, unspecified: Secondary | ICD-10-CM

## 2013-05-05 DIAGNOSIS — B9689 Other specified bacterial agents as the cause of diseases classified elsewhere: Secondary | ICD-10-CM | POA: Diagnosis present

## 2013-05-05 DIAGNOSIS — K5900 Constipation, unspecified: Secondary | ICD-10-CM

## 2013-05-05 DIAGNOSIS — E871 Hypo-osmolality and hyponatremia: Secondary | ICD-10-CM

## 2013-05-05 DIAGNOSIS — N186 End stage renal disease: Secondary | ICD-10-CM | POA: Diagnosis present

## 2013-05-05 DIAGNOSIS — N10 Acute tubulo-interstitial nephritis: Secondary | ICD-10-CM

## 2013-05-05 DIAGNOSIS — I10 Essential (primary) hypertension: Secondary | ICD-10-CM

## 2013-05-05 DIAGNOSIS — J40 Bronchitis, not specified as acute or chronic: Secondary | ICD-10-CM

## 2013-05-05 LAB — CBC WITH DIFFERENTIAL/PLATELET
Eosinophils Relative: 4 % (ref 0–5)
HCT: 37.7 % (ref 36.0–46.0)
Hemoglobin: 12.4 g/dL (ref 12.0–15.0)
Lymphs Abs: 1.5 10*3/uL (ref 0.7–4.0)
MCV: 92.4 fL (ref 78.0–100.0)
Monocytes Relative: 9 % (ref 3–12)
Platelets: 270 10*3/uL (ref 150–400)
RBC: 4.08 MIL/uL (ref 3.87–5.11)
WBC Morphology: INCREASED
WBC: 10.7 10*3/uL — ABNORMAL HIGH (ref 4.0–10.5)

## 2013-05-05 LAB — BASIC METABOLIC PANEL
CO2: 22 mEq/L (ref 19–32)
Calcium: 10.2 mg/dL (ref 8.4–10.5)
Chloride: 93 mEq/L — ABNORMAL LOW (ref 96–112)
Potassium: 4 mEq/L (ref 3.5–5.1)
Sodium: 132 mEq/L — ABNORMAL LOW (ref 135–145)

## 2013-05-05 LAB — URINALYSIS, ROUTINE W REFLEX MICROSCOPIC
Nitrite: NEGATIVE
Specific Gravity, Urine: 1.02 (ref 1.005–1.030)
Urobilinogen, UA: 0.2 mg/dL (ref 0.0–1.0)

## 2013-05-05 LAB — URINE MICROSCOPIC-ADD ON

## 2013-05-05 MED ORDER — SODIUM CHLORIDE 0.9 % IJ SOLN: 3.0000 mL | INTRAMUSCULAR | Status: DC | PRN

## 2013-05-05 MED ORDER — MORPHINE SULFATE 4 MG/ML IJ SOLN
4.0000 mg | Freq: Once | INTRAMUSCULAR | Status: AC
  Administered 2013-05-05: 4 mg via INTRAVENOUS
  Filled 2013-05-05: qty 1

## 2013-05-05 MED ORDER — SODIUM CHLORIDE 0.9 % IV BOLUS (SEPSIS)
500.0000 mL | Freq: Once | INTRAVENOUS | Status: AC
  Administered 2013-05-05: 500 mL via INTRAVENOUS

## 2013-05-05 MED ORDER — ONDANSETRON HCL 4 MG/2ML IJ SOLN
4.0000 mg | Freq: Once | INTRAMUSCULAR | Status: AC
  Administered 2013-05-05: 4 mg via INTRAVENOUS
  Filled 2013-05-05: qty 2

## 2013-05-05 MED ORDER — DEXTROSE 5 % IV SOLN
1.0000 g | Freq: Once | INTRAVENOUS | Status: AC
  Administered 2013-05-05: 1 g via INTRAVENOUS
  Filled 2013-05-05: qty 10

## 2013-05-05 MED ORDER — HEPARIN SODIUM (PORCINE) 5000 UNIT/ML IJ SOLN
5000.0000 [IU] | Freq: Three times a day (TID) | INTRAMUSCULAR | Status: DC
  Filled 2013-05-05: qty 1

## 2013-05-05 MED ORDER — DEXTROSE 5 % IV SOLN
1.0000 g | INTRAVENOUS | Status: DC
  Filled 2013-05-05 (×3): qty 10

## 2013-05-05 MED ORDER — ACETAMINOPHEN 650 MG RE SUPP: 650.0000 mg | Freq: Four times a day (QID) | RECTAL | Status: DC | PRN

## 2013-05-05 MED ORDER — SODIUM CHLORIDE 0.9 % IJ SOLN
3.0000 mL | Freq: Two times a day (BID) | INTRAMUSCULAR | Status: DC
  Administered 2013-05-06: 3 mL via INTRAVENOUS

## 2013-05-05 MED ORDER — ACETAMINOPHEN 325 MG PO TABS
650.0000 mg | ORAL_TABLET | Freq: Four times a day (QID) | ORAL | Status: DC | PRN
  Administered 2013-05-05: 650 mg via ORAL
  Filled 2013-05-05: qty 2

## 2013-05-05 MED ORDER — SODIUM CHLORIDE 0.9 % IV SOLN: 250.0000 mL | INTRAVENOUS | Status: DC | PRN

## 2013-05-05 MED ORDER — ONDANSETRON HCL 4 MG/2ML IJ SOLN
4.0000 mg | Freq: Four times a day (QID) | INTRAMUSCULAR | Status: DC | PRN
  Administered 2013-05-05 – 2013-05-06 (×4): 4 mg via INTRAVENOUS
  Filled 2013-05-05 (×4): qty 2

## 2013-05-05 MED ORDER — CINACALCET HCL 30 MG PO TABS
30.0000 mg | ORAL_TABLET | Freq: Every day | ORAL | Status: DC
  Administered 2013-05-06: 30 mg via ORAL
  Filled 2013-05-05 (×4): qty 1

## 2013-05-05 MED ORDER — VITAMIN D (ERGOCALCIFEROL) 1.25 MG (50000 UNIT) PO CAPS
50000.0000 [IU] | ORAL_CAPSULE | ORAL | Status: DC
  Filled 2013-05-05: qty 1

## 2013-05-05 MED ORDER — ONDANSETRON HCL 4 MG PO TABS: 4.0000 mg | ORAL_TABLET | Freq: Four times a day (QID) | ORAL | Status: DC | PRN

## 2013-05-05 NOTE — ED Notes (Signed)
Pt states she is taking bactrim for a UTI ( chronic ) states it is not helping and is causing diarrhea. States she makes urine and self caths  Every two hours.

## 2013-05-05 NOTE — H&P (Signed)
[facility]          History and Physical    PCP:   No PCP Per Patient   Chief Complaint:  Abdominal pain  HPI: Patient is a very pleasant 22 year old female with a known diagnosis of spina bifida, end-stage renal disease who dialyzes on Tuesdays and Saturdays, and neurogenic bladder for which she catheterizes about 3 times a day. She has had prior admissions for urinary tract infection. She states that her abdominal pain feels like the same as when she has a UTI. Sure enough her urine is quite dirty. We have been asked to admit her for further evaluation and management.  Allergies:   Allergies  Allergen Reactions  . Ciprofloxacin Shortness Of Breath, Nausea And Vomiting and Other (See Comments)    HIGH FEVER  . Other Anaphylaxis    Revaclear dialzer  . Peanut-Containing Drug Products Anaphylaxis  . Aleve [Naproxen Sodium] Other (See Comments)    G.I.Bleed  . Influenza Vaccines Nausea And Vomiting    High fever  . Tetanus Toxoids Nausea And Vomiting and Other (See Comments)    HIGH FEVER  . Latex Itching and Rash      Past Medical History  Diagnosis Date  . Spina bifida   . UTI (lower urinary tract infection)   . HTN (hypertension) 05/02/2011  . Anemia associated with chronic renal failure   . 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  . Blood transfusion   . Dialysis care   . ESRD (end stage renal disease) on dialysis     Past Surgical History  Procedure Laterality Date  . Av fistula placement      left arm    Prior to Admission medications   Medication Sig Start Date End Date Taking? Authorizing Provider  ibuprofen (ADVIL,MOTRIN) 200 MG tablet Take 600 mg by mouth every 6 (six) hours as needed for pain.   Yes Historical Provider, MD  sulfamethoxazole-trimethoprim (BACTRIM DS) 800-160 MG per tablet Take 1 tablet by mouth 2 (two) times daily. 05/04/13 05/10/13 Yes Carmin Muskrat, MD  cinacalcet (SENSIPAR) 30 MG tablet Take 30 mg  by mouth daily.    Historical Provider, MD  Epoetin Alfa (EPOGEN IJ) Inject as directed. Pt gets on dialysis days which are Tuesday and Saturday    Historical Provider, MD  Vitamin D, Ergocalciferol, (DRISDOL) 50000 UNITS CAPS capsule Take 50,000 Units by mouth every 7 (seven) days. Takes on Thursday    Historical Provider, MD    Social History:  reports that she has never smoked. She does not have any smokeless tobacco history on file. She reports that she does not drink alcohol or use illicit drugs.  Family History  Problem Relation Age of Onset  . Kidney cancer Other     Review of Systems:  Constitutional: Denies fever, chills, diaphoresis, appetite change and fatigue.  HEENT: Denies photophobia, eye pain, redness, hearing loss, ear pain, congestion, sore throat, rhinorrhea, sneezing, mouth sores, trouble swallowing, neck pain, neck stiffness and tinnitus.   Respiratory: Denies SOB, DOE, cough, chest tightness,  and wheezing.   Cardiovascular: Denies chest pain, palpitations and leg swelling.  Gastrointestinal: Denies nausea, vomiting, abdominal pain, diarrhea, constipation, blood in stool and abdominal distention.  Genitourinary: Denies dysuria, urgency, frequency, hematuria, flank pain and difficulty urinating.  Endocrine: Denies: hot or cold intolerance, sweats, changes in hair or nails, polyuria, polydipsia. Musculoskeletal: Denies myalgias, back pain, joint swelling. Skin: Denies pallor, rash and wound.  Neurological: Denies dizziness,  seizures, syncope, weakness, light-headedness, numbness and headaches.  Hematological: Denies adenopathy. Easy bruising, personal or family bleeding history  Psychiatric/Behavioral: Denies suicidal ideation, mood changes, confusion, nervousness, sleep disturbance and agitation   Physical Exam: Blood pressure 116/62, pulse 107, temperature 98.8 F (37.1 C), temperature source Oral, resp. rate 18, weight 27.216 kg (60 lb), SpO2 97.00%. General:  Alert, awake, oriented x3. HEENT: Normocephalic, atraumatic, pupils equal and reactive to light, extraocular movements intact, moist mucous membranes. Neck: Supple, no JVD, no lymphadenopathy, no bruits, no goiter. Cardiovascular: Tachycardic, regular rhythm, no murmurs, rubs or gallops. Meds:" Bilaterally. Abdomen: Soft, tender to palpation of the suprapubic area, positive bowel sounds, no masses or organomegaly noted. Extremities: She has atrophic bilateral lower extremities. Neurologic: Nonfocal, at baseline.  Labs on Admission:  Results for orders placed during the hospital encounter of 05/05/13 (from the past 48 hour(s))  BASIC METABOLIC PANEL     Status: Abnormal   Collection Time    05/05/13  2:25 PM      Result Value Range   Sodium 132 (*) 135 - 145 mEq/L   Comment: DELTA CHECK NOTED   Potassium 4.0  3.5 - 5.1 mEq/L   Comment: DELTA CHECK NOTED   Chloride 93 (*) 96 - 112 mEq/L   CO2 22  19 - 32 mEq/L   Glucose, Bld 91  70 - 99 mg/dL   BUN 38 (*) 6 - 23 mg/dL   Comment: DELTA CHECK NOTED   Creatinine, Ser 5.76 (*) 0.50 - 1.10 mg/dL   Comment: DELTA CHECK NOTED   Calcium 10.2  8.4 - 10.5 mg/dL   GFR calc non Af Amer 10 (*) >90 mL/min   GFR calc Af Amer 11 (*) >90 mL/min   Comment: (NOTE)     The eGFR has been calculated using the CKD EPI equation.     This calculation has not been validated in all clinical situations.     eGFR's persistently <90 mL/min signify possible Chronic Kidney     Disease.  CBC WITH DIFFERENTIAL     Status: Abnormal   Collection Time    05/05/13  2:25 PM      Result Value Range   WBC 10.7 (*) 4.0 - 10.5 K/uL   RBC 4.08  3.87 - 5.11 MIL/uL   Hemoglobin 12.4  12.0 - 15.0 g/dL   HCT 37.7  36.0 - 46.0 %   MCV 92.4  78.0 - 100.0 fL   MCH 30.4  26.0 - 34.0 pg   MCHC 32.9  30.0 - 36.0 g/dL   RDW 13.7  11.5 - 15.5 %   Platelets 270  150 - 400 K/uL   Neutrophils Relative % 73  43 - 77 %   Lymphocytes Relative 14  12 - 46 %   Monocytes Relative 9   3 - 12 %   Eosinophils Relative 4  0 - 5 %   Basophils Relative 0  0 - 1 %   Neutro Abs 7.8 (*) 1.7 - 7.7 K/uL   Lymphs Abs 1.5  0.7 - 4.0 K/uL   Monocytes Absolute 1.0  0.1 - 1.0 K/uL   Eosinophils Absolute 0.4  0.0 - 0.7 K/uL   Basophils Absolute 0.0  0.0 - 0.1 K/uL   WBC Morphology INCREASED BANDS (>20% BANDS)     Comment: VACUOLATED NEUTROPHILS     ATYPICAL LYMPHOCYTES     ATYPICAL MONONUCLEAR CELLS   Smear Review LARGE PLATELETS PRESENT     Comment: GIANT PLATELETS SEEN  URINALYSIS, ROUTINE W REFLEX MICROSCOPIC     Status: Abnormal   Collection Time    05/05/13  3:00 PM      Result Value Range   Color, Urine AMBER (*) YELLOW   Comment: BIOCHEMICALS MAY BE AFFECTED BY COLOR   APPearance TURBID (*) CLEAR   Specific Gravity, Urine 1.020  1.005 - 1.030   pH 8.0  5.0 - 8.0   Glucose, UA NEGATIVE  NEGATIVE mg/dL   Hgb urine dipstick MODERATE (*) NEGATIVE   Bilirubin Urine NEGATIVE  NEGATIVE   Ketones, ur NEGATIVE  NEGATIVE mg/dL   Protein, ur 100 (*) NEGATIVE mg/dL   Urobilinogen, UA 0.2  0.0 - 1.0 mg/dL   Nitrite NEGATIVE  NEGATIVE   Leukocytes, UA LARGE (*) NEGATIVE  URINE MICROSCOPIC-ADD ON     Status: Abnormal   Collection Time    05/05/13  3:00 PM      Result Value Range   Squamous Epithelial / LPF FEW (*) RARE   WBC, UA TOO NUMEROUS TO COUNT  <3 WBC/hpf   RBC / HPF 3-6  <3 RBC/hpf   Bacteria, UA MANY (*) RARE    Radiological Exams on Admission: Dg Chest 2 View  05/04/2013   *RADIOLOGY REPORT*  Clinical Data: Chest pain and body aches; spina bifida  CHEST - 2 VIEW  Comparison: Prior chest x-ray 04/11/2013  Findings: Increased central airway thickening and peribronchial cuffing.  No focal airspace consolidation.  Stable cardiac and mediastinal contours.  No acute osseous abnormality.  IMPRESSION: Increased central airway thickening and peribronchial cuffing is nonspecific but can be seen with viral respiratory illness.  No focal airspace consolidation.   Original Report  Authenticated By: Jacqulynn Cadet, M.D.    Assessment/Plan Principal Problem:   Abdominal pain Active Problems:   UTI (lower urinary tract infection)   ESRD (end stage renal disease) on dialysis   Spina bifida   UTI/abdominal pain -Admit for IV antibiotics and symptom management pending culture data. -Prior cultures consistent with Escherichia coli. -She is at high-risk for UTIs given her neurogenic bladder and her need for in and out caths.  End-stage renal disease -Will request nephrology consultation for assistance with dialysis. -Per patient she dialyzes only twice a week on Tuesdays and Saturdays.  DVT prophylaxis -Subcutaneous heparin.  CODE STATUS -Full code  Time Spent on Admission: 65 minutes  HERNANDEZ ACOSTA,ESTELA [facility] Pager: [phone] 05/05/2013, 6:21 PM

## 2013-05-05 NOTE — ED Notes (Signed)
Complain of back pain. 10/10

## 2013-05-05 NOTE — ED Provider Notes (Signed)
CSN: [id number]     Arrival date & time 05/05/13  1256 History   This chart was scribed for [name], MD, by Neta Ehlers, ED Scribe. This patient was seen in room APA07/APA07 and the patient's care was started at 1:20 PM. First MD Initiated Contact with Patient 05/05/13 1309     Chief Complaint  Patient presents with  . Abdominal Pain   (Consider location/radiation/quality/duration/timing/severity/associated sxs/prior Treatment) HPI HPI Comments: [name] is a 22 y.o. female, with a h/o spina bifida and ESRD, who presents to the Emergency Department complaining of generalized pain to her back, head, and abdomen. The pt reports she was diagnosed with a UTI yesterday; she was prescribed bactrim and has been taking the antibiotic for two days. The pt reports decreased urination, a fever yesterday which has since resolved, and mild episodes of diarrhea yesterday evening. She states that with prior episodes of UTI, she has been admitted to the hospital. She has dialysis on Tuesday and Saturdays; she reports that she usually urinates normally.  The pt is a non-smoker.   Dr. Geryl Councilman at [facility] is the pt's nephrologist.  The pt has dialysis in [place].   Past Medical History  Diagnosis Date  . Spina bifida   . UTI (lower urinary tract infection)   . HTN (hypertension) 05/02/2011  . Anemia associated with chronic renal failure   . 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  . Blood transfusion   . Dialysis care   . ESRD (end stage renal disease) on dialysis    Past Surgical History  Procedure Laterality Date  . Av fistula placement      left arm   Family History  Problem Relation Age of Onset  . Kidney cancer Other    History  Substance Use Topics  . Smoking status: Never Smoker   . Smokeless tobacco: Not on file  . Alcohol Use: No   No OB history provided.  Review of Systems  Constitutional: Negative for fever.  Gastrointestinal: Positive for nausea,  vomiting, abdominal pain and diarrhea.  Genitourinary: Positive for decreased urine volume.  Musculoskeletal: Positive for back pain and myalgias.  Neurological: Positive for headaches.  All other systems reviewed and are negative.    Allergies  Ciprofloxacin; Other; Peanut-containing drug products; Aleve; Influenza vaccines; Tetanus toxoids; and Latex  Home Medications   Current Outpatient Rx  Name  Route  Sig  Dispense  Refill  . cinacalcet (SENSIPAR) 30 MG tablet   Oral   Take 30 mg by mouth daily.         Marland Kitchen ibuprofen (ADVIL,MOTRIN) 200 MG tablet   Oral   Take 600 mg by mouth every 6 (six) hours as needed for pain.         . promethazine (PHENERGAN) 25 MG tablet   Oral   Take 1 tablet (25 mg total) by mouth every 6 (six) hours as needed for nausea.   15 tablet   0   . sulfamethoxazole-trimethoprim (BACTRIM DS) 800-160 MG per tablet   Oral   Take 1 tablet by mouth 2 (two) times daily.   14 tablet   0   . Vitamin D, Ergocalciferol, (DRISDOL) 50000 UNITS CAPS capsule   Oral   Take 50,000 Units by mouth every 7 (seven) days. Takes on Thursday          BP 110/64  Pulse 127  Temp(Src) 98.8 F (37.1 C) (Oral)  Resp 16  Wt 60 lb (  27.216 kg)  BMI 32.58 kg/m2  SpO2 96%  Physical Exam  Abdominal:  Tenderness to left-sided periumbilical area  Musculoskeletal:  Tenderness to left lower back     ED Course  Procedures (including critical care time)  DIAGNOSTIC STUDIES: Oxygen Saturation is 96% on room air, normal by my interpretation.    COORDINATION OF CARE:   1:24 PM- Discussed treatment plan with patient which includes labs, and the patient agreed to the plan.   Labs Review Labs Reviewed  BASIC METABOLIC PANEL - Abnormal; Notable for the following:    Sodium 132 (*)    Chloride 93 (*)    BUN 38 (*)    Creatinine, Ser 5.76 (*)    GFR calc non Af Amer 10 (*)    GFR calc Af Amer 11 (*)    All other components within normal limits  CBC WITH  DIFFERENTIAL - Abnormal; Notable for the following:    WBC 10.7 (*)    Neutro Abs 7.8 (*)    All other components within normal limits  URINALYSIS, ROUTINE W REFLEX MICROSCOPIC - Abnormal; Notable for the following:    Color, Urine AMBER (*)    APPearance TURBID (*)    Hgb urine dipstick MODERATE (*)    Protein, ur 100 (*)    Leukocytes, UA LARGE (*)    All other components within normal limits  URINE MICROSCOPIC-ADD ON - Abnormal; Notable for the following:    Squamous Epithelial / LPF FEW (*)    Bacteria, UA MANY (*)    All other components within normal limits  URINE CULTURE   Imaging Review Dg Chest 2 View  05/04/2013   *RADIOLOGY REPORT*  Clinical Data: Chest pain and body aches; spina bifida  CHEST - 2 VIEW  Comparison: Prior chest x-ray 04/11/2013  Findings: Increased central airway thickening and peribronchial cuffing.  No focal airspace consolidation.  Stable cardiac and mediastinal contours.  No acute osseous abnormality.  IMPRESSION: Increased central airway thickening and peribronchial cuffing is nonspecific but can be seen with viral respiratory illness.  No focal airspace consolidation.   Original Report Authenticated By: Jacqulynn Cadet, M.D.    EKG Interpretation   None       MDM  No diagnosis found. Pt has spina bifida, ESRD, and UTI.  She is hemodynamically stable.  Urinalysis shows too numerous to count white cells.  Rx IV Rocephin. Urine culture. Admit to general medicine.  I personally performed the services described in this documentation, which was scribed in my presence. The recorded information has been reviewed and is accurate.    [name], MD 05/05/13 [phone]

## 2013-05-05 NOTE — Progress Notes (Signed)
ED/CM noted patient did not have health insurance and/or PCP listed in the computer.  Patient was given the [facility] with information on the clinics, food pantries, and the handout for new health insurance sign-up.  Patient expressed appreciation for this.

## 2013-05-05 NOTE — ED Notes (Signed)
Pt being evaluated by hospitiist

## 2013-05-05 NOTE — ED Notes (Addendum)
abd pain, vomiting , back pain,   Seen here 10/8 and dx with UTI. Pt reports diarrhea also

## 2013-05-06 LAB — CBC
HCT: 33 % — ABNORMAL LOW (ref 36.0–46.0)
Hemoglobin: 10.7 g/dL — ABNORMAL LOW (ref 12.0–15.0)
MCH: 30 pg (ref 26.0–34.0)
MCHC: 32.4 g/dL (ref 30.0–36.0)
MCV: 92.4 fL (ref 78.0–100.0)
Platelets: 268 10*3/uL (ref 150–400)
RBC: 3.57 MIL/uL — ABNORMAL LOW (ref 3.87–5.11)
RDW: 13.8 % (ref 11.5–15.5)
WBC: 6.7 10*3/uL (ref 4.0–10.5)

## 2013-05-06 LAB — BASIC METABOLIC PANEL
BUN: 44 mg/dL — ABNORMAL HIGH (ref 6–23)
Calcium: 9.2 mg/dL (ref 8.4–10.5)
Chloride: 99 mEq/L (ref 96–112)
Creatinine, Ser: 6.49 mg/dL — ABNORMAL HIGH (ref 0.50–1.10)
GFR calc Af Amer: 10 mL/min — ABNORMAL LOW (ref >90)
Sodium: 136 mEq/L (ref 135–145)

## 2013-05-06 LAB — URINE CULTURE

## 2013-05-06 MED ORDER — ONDANSETRON HCL 4 MG PO TABS: 4.0000 mg | ORAL_TABLET | Freq: Three times a day (TID) | ORAL | Status: DC | PRN

## 2013-05-06 MED ORDER — OXYCODONE HCL 5 MG PO TABS
5.0000 mg | ORAL_TABLET | Freq: Four times a day (QID) | ORAL | Status: DC | PRN
  Administered 2013-05-06: 5 mg via ORAL
  Filled 2013-05-06: qty 1

## 2013-05-06 MED ORDER — HYDROCODONE-ACETAMINOPHEN 5-325 MG PO TABS: 1.0000 | ORAL_TABLET | Freq: Four times a day (QID) | ORAL | Status: DC | PRN

## 2013-05-06 MED ORDER — CEPHALEXIN 500 MG PO CAPS: 500.0000 mg | ORAL_CAPSULE | Freq: Two times a day (BID) | ORAL | Status: AC

## 2013-05-06 NOTE — Progress Notes (Signed)
Physician Discharge Summary  [name] [id number] DOB: 29-Aug-1990 DOA: 05/05/2013  PCP: No PCP Per Patient  Admit date: 05/05/2013 Discharge date: 05/06/2013  Time spent: 40 minutes  Recommendations for Outpatient Follow-up:  Pt refuses PCP and HH. Will continue with dialysis as per schedule  Discharge Diagnoses:  Principal Problem:   Abdominal pain Active Problems:   ESRD (end stage renal disease) on dialysis   Spina bifida   UTI (lower urinary tract infection)   Discharge Condition: stable  Diet recommendation: regular  Filed Weights   05/05/13 1300 05/05/13 1857  Weight: 27.216 kg (60 lb) 28.1 kg (61 lb 15.2 oz)    History of present illness:  Patient is a very pleasant 22 year old female with a known diagnosis of spina bifida, end-stage renal disease who dialyzes on Tuesdays and Saturdays, and neurogenic bladder for which she catheterizes about 3 times a day. She presented to ED on 05/05/13 with cc abdominal pain. She reported prior admissions for urinary tract infection. She stated that her abdominal pain felt like the same as when she has a UTI. Sure enough her urine was quite dirty. She was admitted for further treatment and evaluation.   Hospital Course:  UTI/abdominal pain  -Admitwd for IV antibiotics and symptom management pending culture data. Culture return CITROBACTER KOSERI sensitive to keflex. Will discharge with 10 days keflex. Abdominal pain resolved at discharge. Pt afebrile and non-toxic appearing. She remains high-risk for UTIs given her neurogenic bladder and her need for in and out caths. She is refusing PCP follow up at discharge. She states her Aunt is setting her up with her PCP. Encouraged her to follow up within 1-2 weeks.   End-stage renal disease  --Per patient she dialyzes only twice a week on Tuesdays and Saturdays. Will resume dialysis schedule at discharge DVT prophylaxis  -Subcutaneous heparin.       Procedures:  none  Consultations:  none  Discharge Exam: Filed Vitals:   05/06/13 0516  BP: 93/49  Pulse: 96  Temp: 98.3 F (36.8 C)  Resp: 20    General: well nourished NAD Cardiovascular: RRR No MGR  Respiratory: normal effort BS clear bilaterally no wheeze Abdomen soft +BS non-tender to palpation  Discharge Instructions  Discharge Orders   Future Orders Complete By Expires   Diet - low sodium heart healthy  As directed    Discharge instructions  As directed    Comments:     Take medication as directed Follow up with PCP 1-2 weeks for evaluation of symptoms   Increase activity slowly  As directed        Medication List    STOP taking these medications       sulfamethoxazole-trimethoprim 800-160 MG per tablet  Commonly known as:  BACTRIM DS      TAKE these medications       cephALEXin 500 MG capsule  Commonly known as:  KEFLEX  Take 1 capsule (500 mg total) by mouth 2 (two) times daily.     cinacalcet 30 MG tablet  Commonly known as:  SENSIPAR  Take 30 mg by mouth daily.     EPOGEN IJ  Inject as directed. Pt gets on dialysis days which are Tuesday and Saturday     HYDROcodone-acetaminophen 5-325 MG per tablet  Commonly known as:  NORCO  Take 1 tablet by mouth every 6 (six) hours as needed for pain.     ibuprofen 200 MG tablet  Commonly known as:  ADVIL,MOTRIN  Take 600 mg  by mouth every 6 (six) hours as needed for pain.     ondansetron 4 MG tablet  Commonly known as:  ZOFRAN  Take 1 tablet (4 mg total) by mouth every 8 (eight) hours as needed for nausea.     Vitamin D (Ergocalciferol) 50000 UNITS Caps capsule  Commonly known as:  DRISDOL  Take 50,000 Units by mouth every 7 (seven) days. Takes on Thursday       Allergies  Allergen Reactions  . Ciprofloxacin Shortness Of Breath, Nausea And Vomiting and Other (See Comments)    HIGH FEVER  . Other Anaphylaxis    Revaclear dialzer  . Peanut-Containing Drug Products Anaphylaxis  .  Aleve [Naproxen Sodium] Other (See Comments)    G.I.Bleed  . Influenza Vaccines Nausea And Vomiting    High fever  . Tetanus Toxoids Nausea And Vomiting and Other (See Comments)    HIGH FEVER  . Latex Itching and Rash       Follow-up Information   Follow up with No PCP Per Patient. (patient refusing PCP set up. Refuses [facility] as well. states she has everything she needs)    Specialty:  General Practice   Contact information:   [place] [address] [phone]        The results of significant diagnostics from this hospitalization (including imaging, microbiology, ancillary and laboratory) are listed below for reference.    Significant Diagnostic Studies: Dg Chest 2 View  05/04/2013   *RADIOLOGY REPORT*  Clinical Data: Chest pain and body aches; spina bifida  CHEST - 2 VIEW  Comparison: Prior chest x-ray 04/11/2013  Findings: Increased central airway thickening and peribronchial cuffing.  No focal airspace consolidation.  Stable cardiac and mediastinal contours.  No acute osseous abnormality.  IMPRESSION: Increased central airway thickening and peribronchial cuffing is nonspecific but can be seen with viral respiratory illness.  No focal airspace consolidation.   Original Report Authenticated By: Jacqulynn Cadet, M.D.   Dg Chest 2 View  04/11/2013   *RADIOLOGY REPORT*  Clinical Data: Spina bifida, end-stage renal disease, vomiting  CHEST - 2 VIEW  Comparison: 08/21/2012.  Findings:  Normal heart size and vascularity.  Mild hyperinflation without focal airspace process, pneumonia, collapse or consolidation.  No large effusion or pneumothorax.  Trachea midline.  Spinal deformity as before.  IMPRESSION: Hyperinflation.  No acute finding.   Original Report Authenticated By: Jerilynn Mages. Annamaria Boots, M.D.    Microbiology: Recent Results (from the past 240 hour(s))  URINE CULTURE     Status: None   Collection Time    05/04/13  1:45 AM      Result Value Range Status   Specimen  Description URINE, CATHETERIZED   Final   Special Requests NONE   Final   Culture  Setup Time     Final   Value: 05/04/2013 03:05     Performed at [facility]     Final   Value: >=100,000 COLONIES/ML     Performed at [facility]     Final   Value: CITROBACTER KOSERI     Performed at [facility]   Report Status 05/06/2013 FINAL   Final   Organism ID, Bacteria CITROBACTER KOSERI   Final     Labs: Basic Metabolic Panel:  Recent Labs Lab 05/04/13 0145 05/05/13 1425 05/06/13 0459  NA 140 132* 136  K 3.0* 4.0 3.6  CL 98 93* 99  CO2 29 22 21   GLUCOSE  83 91 79  BUN 18 38* 44*  CREATININE 3.37* 5.76* 6.49*  CALCIUM 9.2 10.2 9.2   Liver Function Tests:  Recent Labs Lab 05/04/13 0145  AST 17  ALT 8  ALKPHOS 146*  BILITOT 0.2*  PROT 7.9  ALBUMIN 3.7   No results found for this basename: LIPASE, AMYLASE,  in the last 168 hours No results found for this basename: AMMONIA,  in the last 168 hours CBC:  Recent Labs Lab 05/04/13 0145 05/05/13 1425 05/06/13 0459  WBC 4.9 10.7* 6.7  NEUTROABS 2.1 7.8*  --   HGB 11.6* 12.4 10.7*  HCT 35.6* 37.7 33.0*  MCV 92.2 92.4 92.4  PLT 255 270 268   Cardiac Enzymes: No results found for this basename: CKTOTAL, CKMB, CKMBINDEX, TROPONINI,  in the last 168 hours BNP: BNP (last 3 results) No results found for this basename: PROBNP,  in the last 8760 hours CBG: No results found for this basename: GLUCAP,  in the last 168 hours     Signed:  Radene Gunning  [facility] 05/06/2013, 10:49 AM    Patient seen and examined. Agree with above note. Plan to DC home today on keflex. Has been given zofran and vicodin for symptom management as well as keflex for her UTI.  Domingo Mend, MD [facility] Pager: [phone]

## 2013-05-06 NOTE — Progress Notes (Signed)
UR chart review completed.  

## 2013-05-06 NOTE — Progress Notes (Signed)
[facility] PROGRESS NOTE  [name] [id number] DOB: 01-21-1991 DOA: 05/05/2013 PCP: No PCP Per Patient  Assessment/Plan: UTI/abdominal pain  -reports some improvement in abdominal pain. Eating pancakes. Prior cultures consistent with Escherichia coli. Current cultures pending.  -She is at high-risk for UTIs given her neurogenic bladder and her need for in and out caths. Rocephin day #2. White count with in normal limits. Afebrile and non-toxic appearing. Continue pain medicine and anti emetic.  End-stage renal disease  -Await nephrology consultation for assistance with dialysis.  -Per patient she dialyzes only twice a week on Tuesdays and Saturdays. Creatinine appears above baseline. Potassium level within normal limits  Hyponatremia: mild. Resolved with IV fluids. Monitor  Hypokalemia: resolved. monitior  DVT prophylaxis  -Subcutaneous heparin.   Code Status: full Family Communication: none present Disposition Plan: home when ready   Consultants:  nephrology  Procedures:  none  Antibiotics: Rocephin 05/05/13>>> HPI/Subjective: Sitting up in bed eating breakfast. Reports some improvement in pain but remains somewhat nauseated  Objective: Filed Vitals:   05/06/13 0516  BP: 93/49  Pulse: 96  Temp: 98.3 F (36.8 C)  Resp: 20   No intake or output data in the 24 hours ending 05/06/13 0854 Filed Weights   05/05/13 1300 05/05/13 1857  Weight: 27.216 kg (60 lb) 28.1 kg (61 lb 15.2 oz)    Exam:   General:  Well nourished NAD  Cardiovascular: slightly tachycardic but regular No MGR   Respiratory: normal effort BS clear bilaterally no wheeze  Abdomen: soft +BS non-tender to palpation  Musculoskeletal: atrophic bilateral LE   Data Reviewed: Basic Metabolic Panel:  Recent Labs Lab 05/04/13 0145 05/05/13 1425 05/06/13 0459  NA 140 132* 136  K 3.0* 4.0 3.6  CL 98 93* 99  CO2 29 22 21   GLUCOSE 83 91 79  BUN 18 38* 44*  CREATININE 3.37*  5.76* 6.49*  CALCIUM 9.2 10.2 9.2   Liver Function Tests:  Recent Labs Lab 05/04/13 0145  AST 17  ALT 8  ALKPHOS 146*  BILITOT 0.2*  PROT 7.9  ALBUMIN 3.7   No results found for this basename: LIPASE, AMYLASE,  in the last 168 hours No results found for this basename: AMMONIA,  in the last 168 hours CBC:  Recent Labs Lab 05/04/13 0145 05/05/13 1425 05/06/13 0459  WBC 4.9 10.7* 6.7  NEUTROABS 2.1 7.8*  --   HGB 11.6* 12.4 10.7*  HCT 35.6* 37.7 33.0*  MCV 92.2 92.4 92.4  PLT 255 270 268   Cardiac Enzymes: No results found for this basename: CKTOTAL, CKMB, CKMBINDEX, TROPONINI,  in the last 168 hours BNP (last 3 results) No results found for this basename: PROBNP,  in the last 8760 hours CBG: No results found for this basename: GLUCAP,  in the last 168 hours  Recent Results (from the past 240 hour(s))  URINE CULTURE     Status: None   Collection Time    05/04/13  1:45 AM      Result Value Range Status   Specimen Description URINE, CATHETERIZED   Final   Special Requests NONE   Final   Culture  Setup Time     Final   Value: 05/04/2013 03:05     Performed at [place]     Final   Value: >=100,000 COLONIES/ML     Performed at [facility]   Culture     Final   Value: [employer]  Performed at [facility]   Report Status 05/06/2013 FINAL   Final   Organism ID, Bacteria CITROBACTER KOSERI   Final     Studies: No results found.  Scheduled Meds: . cefTRIAXone (ROCEPHIN)  IV  1 g Intravenous Q24H  . cinacalcet  30 mg Oral Q breakfast  . heparin  5,000 Units Subcutaneous Q8H  . sodium chloride  3 mL Intravenous Q12H  . Vitamin D (Ergocalciferol)  50,000 Units Oral Q Thu   Continuous Infusions:   Principal Problem:   Abdominal pain Active Problems:   ESRD (end stage renal disease) on dialysis   Spina bifida   UTI (lower urinary tract infection)    Time spent: 30 minutes    [place]  [facility] Pager [phone]. If 7PM-7AM, please contact night-coverage at [email or web address], password [facility] 05/06/2013, 8:54 AM  LOS: 1 day             Patient seen and examined. Please see DC Summary for further details.  Domingo Mend, MD [facility] Pager: [phone]

## 2013-05-06 NOTE — Progress Notes (Signed)
Pt verbalizes understanding of d/c instructions, medications and follow up appt. I reiterated importance of pt finding a PCP or calling the number provided to her. Pt has necessary prescription. IV was d/c without complication. Pt d/c via wheelchair by her cousin. I escorted them to the main entrance. Marry Guan

## 2013-05-06 NOTE — Care Management Note (Signed)
    Page 1 of 1   05/06/2013     11:21:09 AM   CARE MANAGEMENT NOTE 05/06/2013  Patient:  [name], [name]   Account Number:  [id number]  Date Initiated:  05/06/2013  Documentation initiated by:  Theophilus Kinds  Subjective/Objective Assessment:   Pt admitted from home with UTI. Pt lives with her grandmother and will return home with her grandmother at discharge. Pt is fairly independent with ADL's. Pt does have [facility] w/c for home use. Pt receives dialysis 2 days [facility] week with [facility]     Action/Plan:   and gets there by Lucianne Lei. Pt refuses any HH at discharge. Pt also refuses to allow CM to arrnage PCP followup stating that her aunt is getting her PCP followup with Dr. Wolfgang Phoenix here in [place].   Anticipated DC Date:  05/06/2013   Anticipated DC Plan:  [place]  CM consult      Choice offered to / List presented to:             Status of service:  Completed, signed off Medicare Important Message given?  NA - LOS <3 / Initial given by admissions (If response is "NO", the following Medicare IM given date fields will be blank) Date Medicare IM given:   Date Additional Medicare IM given:    Discharge Disposition:  HOME/SELF CARE  Per UR Regulation:    If discussed at Long Length of Stay Meetings, dates discussed:    Comments:  05/06/13 [place], RN BSN CM

## 2013-05-07 ENCOUNTER — Telehealth (HOSPITAL_COMMUNITY): Payer: Self-pay | Admitting: Emergency Medicine

## 2013-05-07 LAB — URINE CULTURE: Colony Count: >=100000

## 2013-05-07 NOTE — ED Notes (Signed)
Post ED Visit - Positive Culture Follow-up  Culture report reviewed by antimicrobial stewardship pharmacist: []  Wes [place], Pharm.D., BCPS [x]  Heide Guile, Pharm.D., BCPS []  Alycia Rossetti, Pharm.D., BCPS []  [place], Pharm.D., BCPS, AAHIVP []  Legrand [place], Pharm.D., BCPS, AAHIVP  Positive urine culture Treated with Sulfa-Trimeth, organism sensitive to the same and no further patient follow-up is required at this time.  Kylie A Holland 05/07/2013, 3:12 PM

## 2013-05-10 LAB — CULTURE, BLOOD (ROUTINE X 2)
Culture: NO GROWTH
Culture: NO GROWTH

## 2013-05-26 ENCOUNTER — Encounter (HOSPITAL_COMMUNITY): Payer: Self-pay | Admitting: Emergency Medicine

## 2013-05-26 ENCOUNTER — Emergency Department (HOSPITAL_COMMUNITY)
Admission: EM | Admit: 2013-05-26 | Discharge: 2013-05-26 | Disposition: A | Discharge status: Home / Self Care | Payer: Medicare Other | Attending: Emergency Medicine | Admitting: Emergency Medicine

## 2013-05-26 ENCOUNTER — Emergency Department (HOSPITAL_COMMUNITY): Payer: Medicare Other

## 2013-05-26 DIAGNOSIS — N186 End stage renal disease: Secondary | ICD-10-CM | POA: Insufficient documentation

## 2013-05-26 DIAGNOSIS — I120 Hypertensive chronic kidney disease with stage 5 chronic kidney disease or end stage renal disease: Secondary | ICD-10-CM | POA: Insufficient documentation

## 2013-05-26 DIAGNOSIS — Z87440 Personal history of urinary (tract) infections: Secondary | ICD-10-CM | POA: Insufficient documentation

## 2013-05-26 DIAGNOSIS — Z79899 Other long term (current) drug therapy: Secondary | ICD-10-CM | POA: Insufficient documentation

## 2013-05-26 DIAGNOSIS — Z91040 Latex allergy status: Secondary | ICD-10-CM | POA: Insufficient documentation

## 2013-05-26 DIAGNOSIS — Q898 Other specified congenital malformations: Secondary | ICD-10-CM | POA: Insufficient documentation

## 2013-05-26 DIAGNOSIS — Z862 Personal history of diseases of the blood and blood-forming organs and certain disorders involving the immune mechanism: Secondary | ICD-10-CM | POA: Insufficient documentation

## 2013-05-26 DIAGNOSIS — Z992 Dependence on renal dialysis: Secondary | ICD-10-CM | POA: Insufficient documentation

## 2013-05-26 DIAGNOSIS — Q059 Spina bifida, unspecified: Secondary | ICD-10-CM | POA: Insufficient documentation

## 2013-05-26 DIAGNOSIS — K5900 Constipation, unspecified: Secondary | ICD-10-CM | POA: Insufficient documentation

## 2013-05-26 NOTE — ED Notes (Signed)
Pt has several small pencil sized stool in toilet prior to pt going to x-ray

## 2013-05-26 NOTE — ED Provider Notes (Signed)
CSN: [id number]     Arrival date & time 05/26/13  1549 History   This chart was scribed for Jeanne Christen, MD by Zettie Pho, ED Scribe. This patient was seen in room APA03/APA03 and the patient's care was started at 4:43 PM.    Chief Complaint  Patient presents with  . Constipation   The history is provided by the patient. No language interpreter was used.   HPI Comments: [name] is a 22 y.o. Female with a history of spina bifida who presents to the Emergency Department complaining of constipation, last BM about 2 weeks ago. Patient reports associated pain to the LLQ of the abdomen onset 1 week ago that she states "has never been this bad." Patient reports some associated urinary frequency. She states that she has taken Norco at home. Patient also reports a history of end-stage renal disease that is currently managed with dialysis.   Past Medical History  Diagnosis Date  . Spina bifida   . UTI (lower urinary tract infection)   . HTN (hypertension) 05/02/2011  . Anemia associated with chronic renal failure   . 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  . Blood transfusion   . Dialysis care   . ESRD (end stage renal disease) on dialysis    Past Surgical History  Procedure Laterality Date  . Av fistula placement      left arm   Family History  Problem Relation Age of Onset  . Kidney cancer Other    History  Substance Use Topics  . Smoking status: Never Smoker   . Smokeless tobacco: Not on file  . Alcohol Use: No   OB History   Grav Para Term Preterm Abortions TAB SAB Ect Mult Living                 Review of Systems  A complete 10 system review of systems was obtained and all systems are negative except as noted in the HPI and PMH.   Allergies  Ciprofloxacin; Other; Peanut-containing drug products; Aleve; Influenza vaccines; Tetanus toxoids; and Latex  Home Medications   Current Outpatient Rx  Name  Route  Sig  Dispense  Refill  . cinacalcet  (SENSIPAR) 30 MG tablet   Oral   Take 30 mg by mouth daily.         Marland Kitchen Epoetin Alfa (EPOGEN IJ)   Injection   Inject as directed. Pt gets on dialysis days which are Tuesday and Saturday         . HYDROcodone-acetaminophen (NORCO) 5-325 MG per tablet   Oral   Take 1 tablet by mouth every 6 (six) hours as needed for pain.   15 tablet   0   . ibuprofen (ADVIL,MOTRIN) 200 MG tablet   Oral   Take 600 mg by mouth every 6 (six) hours as needed for pain.         Marland Kitchen ondansetron (ZOFRAN) 4 MG tablet   Oral   Take 1 tablet (4 mg total) by mouth every 8 (eight) hours as needed for nausea.   15 tablet   0   . Vitamin D, Ergocalciferol, (DRISDOL) 50000 UNITS CAPS capsule   Oral   Take 50,000 Units by mouth every 7 (seven) days. Takes on Thursday          Triage Vitals: BP 119/66  Pulse 111  Temp(Src) 98.2 F (36.8 C) (Oral)  Wt 58 lb (26.309 kg)  BMI 31.49 kg/m2  SpO2  98%  LMP 05/26/2013  Physical Exam  Nursing note and vitals reviewed. Constitutional: She is oriented to person, place, and time. She appears well-developed and well-nourished.  Patient is somewhat tearful.   HENT:  Head: Normocephalic and atraumatic.  Eyes: Conjunctivae and EOM are normal. Pupils are equal, round, and reactive to light.  Neck: Normal range of motion. Neck supple.  Cardiovascular: Normal rate, regular rhythm and normal heart sounds.   Pulmonary/Chest: Effort normal and breath sounds normal.  Abdominal: Soft. Bowel sounds are normal. There is tenderness.  Tenderness to palpation to the LLQ  Musculoskeletal: Normal range of motion.  Stump-like appendages consistent with spina bifida.   Neurological: She is alert and oriented to person, place, and time.  Skin: Skin is warm and dry.  Psychiatric: She has a normal mood and affect.    ED Course  Procedures (including critical care time)........ Procedure.......Marland Kitchen digital disimpaction performed by examiner.  Small amount of stool extracted.    Patient has a very narrow pelvic outflow area secondary to her spina bifida.   She was able to pass a small amount of bowel movement on her own  DIAGNOSTIC STUDIES: Oxygen Saturation is 98% on room air, normal by my interpretation.    COORDINATION OF CARE: 4:47 PM- Will perform a rectal exam. Will order an x-ray of the abdomen. Discussed treatment plan with patient at bedside and patient verbalized agreement.     Labs Review Labs Reviewed - No data to display  Imaging Review Dg Abd 1 View  05/26/2013   CLINICAL DATA:  constipation  EXAM: ABDOMEN - 1 VIEW  COMPARISON:  March 29, 2012  FINDINGS: There is stool throughout the colon. Bowel gas pattern is unremarkable on this supine examination. No obstruction or free air seen.  There is spinal dysraphism. The pelvis is markedly midshapened but stable.  IMPRESSION: The large amount of stool throughout colon. Nonspecific gas pattern.   Electronically Signed   By: Lowella Grip M.D.   On: 05/26/2013 18:31      EKG Interpretation   None       MDM  No diagnosis found. Digital disimpaction performed by self. Small of stool extracted. Patient was able to have a bowel movement.  Plain films of abdomen show no free air  I personally performed the services described in this documentation, which was scribed in my presence. The recorded information has been reviewed and is accurate.     Jeanne Christen, MD 05/26/13 2002

## 2013-05-26 NOTE — ED Notes (Signed)
Discharge instructions reviewed with pt, questions answered. Pt verbalized understanding.  

## 2013-05-26 NOTE — ED Notes (Signed)
Unable to have bowel movement.  Small bm yesterday.

## 2013-07-30 DIAGNOSIS — D631 Anemia in chronic kidney disease: Secondary | ICD-10-CM | POA: Diagnosis not present

## 2013-07-30 DIAGNOSIS — N186 End stage renal disease: Secondary | ICD-10-CM | POA: Diagnosis not present

## 2013-07-30 DIAGNOSIS — N2581 Secondary hyperparathyroidism of renal origin: Secondary | ICD-10-CM | POA: Diagnosis not present

## 2013-08-16 DIAGNOSIS — Z992 Dependence on renal dialysis: Secondary | ICD-10-CM | POA: Diagnosis not present

## 2013-08-27 DIAGNOSIS — N186 End stage renal disease: Secondary | ICD-10-CM | POA: Diagnosis not present

## 2013-08-30 DIAGNOSIS — N039 Chronic nephritic syndrome with unspecified morphologic changes: Secondary | ICD-10-CM | POA: Diagnosis not present

## 2013-08-30 DIAGNOSIS — N2581 Secondary hyperparathyroidism of renal origin: Secondary | ICD-10-CM | POA: Diagnosis not present

## 2013-08-30 DIAGNOSIS — N186 End stage renal disease: Secondary | ICD-10-CM | POA: Diagnosis not present

## 2013-08-30 DIAGNOSIS — D631 Anemia in chronic kidney disease: Secondary | ICD-10-CM | POA: Diagnosis not present

## 2013-09-03 DIAGNOSIS — D631 Anemia in chronic kidney disease: Secondary | ICD-10-CM | POA: Diagnosis not present

## 2013-09-03 DIAGNOSIS — N2581 Secondary hyperparathyroidism of renal origin: Secondary | ICD-10-CM | POA: Diagnosis not present

## 2013-09-03 DIAGNOSIS — N039 Chronic nephritic syndrome with unspecified morphologic changes: Secondary | ICD-10-CM | POA: Diagnosis not present

## 2013-09-03 DIAGNOSIS — N186 End stage renal disease: Secondary | ICD-10-CM | POA: Diagnosis not present

## 2013-09-06 DIAGNOSIS — D631 Anemia in chronic kidney disease: Secondary | ICD-10-CM | POA: Diagnosis not present

## 2013-09-06 DIAGNOSIS — N186 End stage renal disease: Secondary | ICD-10-CM | POA: Diagnosis not present

## 2013-09-06 DIAGNOSIS — N2581 Secondary hyperparathyroidism of renal origin: Secondary | ICD-10-CM | POA: Diagnosis not present

## 2013-09-10 DIAGNOSIS — N186 End stage renal disease: Secondary | ICD-10-CM | POA: Diagnosis not present

## 2013-09-10 DIAGNOSIS — N2581 Secondary hyperparathyroidism of renal origin: Secondary | ICD-10-CM | POA: Diagnosis not present

## 2013-09-10 DIAGNOSIS — N039 Chronic nephritic syndrome with unspecified morphologic changes: Secondary | ICD-10-CM | POA: Diagnosis not present

## 2013-09-10 DIAGNOSIS — D631 Anemia in chronic kidney disease: Secondary | ICD-10-CM | POA: Diagnosis not present

## 2013-09-17 DIAGNOSIS — N2581 Secondary hyperparathyroidism of renal origin: Secondary | ICD-10-CM | POA: Diagnosis not present

## 2013-09-17 DIAGNOSIS — D631 Anemia in chronic kidney disease: Secondary | ICD-10-CM | POA: Diagnosis not present

## 2013-09-17 DIAGNOSIS — N186 End stage renal disease: Secondary | ICD-10-CM | POA: Diagnosis not present

## 2013-09-20 DIAGNOSIS — D631 Anemia in chronic kidney disease: Secondary | ICD-10-CM | POA: Diagnosis not present

## 2013-09-20 DIAGNOSIS — N2581 Secondary hyperparathyroidism of renal origin: Secondary | ICD-10-CM | POA: Diagnosis not present

## 2013-09-20 DIAGNOSIS — N186 End stage renal disease: Secondary | ICD-10-CM | POA: Diagnosis not present

## 2013-09-24 DIAGNOSIS — N2581 Secondary hyperparathyroidism of renal origin: Secondary | ICD-10-CM | POA: Diagnosis not present

## 2013-09-24 DIAGNOSIS — N186 End stage renal disease: Secondary | ICD-10-CM | POA: Diagnosis not present

## 2013-09-24 DIAGNOSIS — D631 Anemia in chronic kidney disease: Secondary | ICD-10-CM | POA: Diagnosis not present

## 2013-09-24 DIAGNOSIS — N039 Chronic nephritic syndrome with unspecified morphologic changes: Secondary | ICD-10-CM | POA: Diagnosis not present

## 2013-09-28 DIAGNOSIS — N2581 Secondary hyperparathyroidism of renal origin: Secondary | ICD-10-CM | POA: Diagnosis not present

## 2013-09-28 DIAGNOSIS — D631 Anemia in chronic kidney disease: Secondary | ICD-10-CM | POA: Diagnosis not present

## 2013-09-28 DIAGNOSIS — N039 Chronic nephritic syndrome with unspecified morphologic changes: Secondary | ICD-10-CM | POA: Diagnosis not present

## 2013-09-28 DIAGNOSIS — N186 End stage renal disease: Secondary | ICD-10-CM | POA: Diagnosis not present

## 2013-10-04 DIAGNOSIS — D631 Anemia in chronic kidney disease: Secondary | ICD-10-CM | POA: Diagnosis not present

## 2013-10-04 DIAGNOSIS — N2581 Secondary hyperparathyroidism of renal origin: Secondary | ICD-10-CM | POA: Diagnosis not present

## 2013-10-04 DIAGNOSIS — N186 End stage renal disease: Secondary | ICD-10-CM | POA: Diagnosis not present

## 2013-10-08 DIAGNOSIS — D631 Anemia in chronic kidney disease: Secondary | ICD-10-CM | POA: Diagnosis not present

## 2013-10-08 DIAGNOSIS — N2581 Secondary hyperparathyroidism of renal origin: Secondary | ICD-10-CM | POA: Diagnosis not present

## 2013-10-08 DIAGNOSIS — N186 End stage renal disease: Secondary | ICD-10-CM | POA: Diagnosis not present

## 2013-10-11 DIAGNOSIS — N186 End stage renal disease: Secondary | ICD-10-CM | POA: Diagnosis not present

## 2013-10-11 DIAGNOSIS — N2581 Secondary hyperparathyroidism of renal origin: Secondary | ICD-10-CM | POA: Diagnosis not present

## 2013-10-11 DIAGNOSIS — D631 Anemia in chronic kidney disease: Secondary | ICD-10-CM | POA: Diagnosis not present

## 2013-10-15 ENCOUNTER — Encounter (HOSPITAL_COMMUNITY): Payer: Self-pay | Admitting: Emergency Medicine

## 2013-10-15 ENCOUNTER — Emergency Department (HOSPITAL_COMMUNITY)
Admission: EM | Admit: 2013-10-15 | Discharge: 2013-10-15 | Disposition: A | Discharge status: Home / Self Care | Payer: Medicare Other | Attending: Emergency Medicine | Admitting: Emergency Medicine

## 2013-10-15 DIAGNOSIS — Z792 Long term (current) use of antibiotics: Secondary | ICD-10-CM | POA: Diagnosis not present

## 2013-10-15 DIAGNOSIS — R51 Headache: Secondary | ICD-10-CM | POA: Insufficient documentation

## 2013-10-15 DIAGNOSIS — Q7649 Other congenital malformations of spine, not associated with scoliosis: Secondary | ICD-10-CM | POA: Diagnosis not present

## 2013-10-15 DIAGNOSIS — I10 Essential (primary) hypertension: Secondary | ICD-10-CM | POA: Insufficient documentation

## 2013-10-15 DIAGNOSIS — N2581 Secondary hyperparathyroidism of renal origin: Secondary | ICD-10-CM | POA: Diagnosis not present

## 2013-10-15 DIAGNOSIS — N390 Urinary tract infection, site not specified: Secondary | ICD-10-CM | POA: Diagnosis not present

## 2013-10-15 DIAGNOSIS — Z992 Dependence on renal dialysis: Secondary | ICD-10-CM | POA: Insufficient documentation

## 2013-10-15 DIAGNOSIS — N186 End stage renal disease: Secondary | ICD-10-CM | POA: Insufficient documentation

## 2013-10-15 DIAGNOSIS — Q059 Spina bifida, unspecified: Secondary | ICD-10-CM | POA: Insufficient documentation

## 2013-10-15 DIAGNOSIS — N039 Chronic nephritic syndrome with unspecified morphologic changes: Secondary | ICD-10-CM

## 2013-10-15 DIAGNOSIS — D631 Anemia in chronic kidney disease: Secondary | ICD-10-CM | POA: Diagnosis not present

## 2013-10-15 DIAGNOSIS — Z79899 Other long term (current) drug therapy: Secondary | ICD-10-CM | POA: Diagnosis not present

## 2013-10-15 DIAGNOSIS — Z91040 Latex allergy status: Secondary | ICD-10-CM | POA: Insufficient documentation

## 2013-10-15 LAB — URINALYSIS, ROUTINE W REFLEX MICROSCOPIC
Bilirubin Urine: NEGATIVE
Glucose, UA: NEGATIVE mg/dL
KETONES UR: NEGATIVE mg/dL
Nitrite: NEGATIVE
PH: 8.5 — AB (ref 5.0–8.0)
Protein, ur: 100 mg/dL — AB
SPECIFIC GRAVITY, URINE: 1.015 (ref 1.005–1.030)
Urobilinogen, UA: 0.2 mg/dL (ref 0.0–1.0)

## 2013-10-15 LAB — URINE MICROSCOPIC-ADD ON

## 2013-10-15 MED ORDER — OXYCODONE-ACETAMINOPHEN 5-325 MG PO TABS
1.0000 | ORAL_TABLET | Freq: Once | ORAL | Status: AC
  Administered 2013-10-15: 1 via ORAL

## 2013-10-15 MED ORDER — ONDANSETRON 4 MG PO TBDP
4.0000 mg | ORAL_TABLET | Freq: Once | ORAL | Status: AC
  Administered 2013-10-15: 4 mg via ORAL
  Filled 2013-10-15: qty 1

## 2013-10-15 MED ORDER — CEPHALEXIN 500 MG PO CAPS: 500.0000 mg | ORAL_CAPSULE | Freq: Three times a day (TID) | ORAL | Status: DC

## 2013-10-15 MED ORDER — CEFTRIAXONE SODIUM 1 G IJ SOLR
1.0000 g | Freq: Once | INTRAMUSCULAR | Status: AC
  Administered 2013-10-15: 1 g via INTRAMUSCULAR
  Filled 2013-10-15: qty 10

## 2013-10-15 MED ORDER — HYDROCODONE-ACETAMINOPHEN 5-325 MG PO TABS
1.0000 | ORAL_TABLET | Freq: Once | ORAL | Status: AC
  Administered 2013-10-15: 1 via ORAL
  Filled 2013-10-15: qty 1

## 2013-10-15 MED ORDER — HYDROCODONE-ACETAMINOPHEN 5-325 MG PO TABS: 1.0000 | ORAL_TABLET | Freq: Four times a day (QID) | ORAL | Status: DC | PRN

## 2013-10-15 MED ORDER — OXYCODONE-ACETAMINOPHEN 5-325 MG PO TABS
ORAL_TABLET | ORAL | Status: AC
  Filled 2013-10-15: qty 1

## 2013-10-15 MED ORDER — LIDOCAINE HCL (PF) 1 % IJ SOLN
INTRAMUSCULAR | Status: AC
  Administered 2013-10-15: 2.1 mL
  Filled 2013-10-15: qty 5

## 2013-10-15 NOTE — ED Notes (Signed)
Pt states that she started having n/v on Monday, fever on Tuesday, dizziness and feeling bad today,

## 2013-10-15 NOTE — ED Provider Notes (Signed)
CSN: [id number]     Arrival date & time 10/15/13  1826 History   First MD Initiated Contact with Patient 10/15/13 1908     Chief Complaint  Patient presents with  . Fever     (Consider location/radiation/quality/duration/timing/severity/associated sxs/prior Treatment) Patient is a 23 y.o. female presenting with fever. The history is provided by the patient (the pt complains of a fever.  she thinks she has a uti).  Fever Temp source:  Subjective Severity:  Mild Onset quality:  Sudden Timing:  Constant Progression:  Unchanged Chronicity:  Recurrent Associated symptoms: headaches   Associated symptoms: no chest pain, no congestion, no cough, no diarrhea and no rash     Past Medical History  Diagnosis Date  . Spina bifida   . UTI (lower urinary tract infection)   . HTN (hypertension) 05/02/2011  . Anemia associated with chronic renal failure   . 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  . Blood transfusion   . Dialysis care   . ESRD (end stage renal disease) on dialysis    Past Surgical History  Procedure Laterality Date  . Av fistula placement      left arm   Family History  Problem Relation Age of Onset  . Kidney cancer Other    History  Substance Use Topics  . Smoking status: Never Smoker   . Smokeless tobacco: Not on file  . Alcohol Use: No   OB History   Grav Para Term Preterm Abortions TAB SAB Ect Mult Living                 Review of Systems  Constitutional: Positive for fever. Negative for appetite change and fatigue.  HENT: Negative for congestion, ear discharge and sinus pressure.   Eyes: Negative for discharge.  Respiratory: Negative for cough.   Cardiovascular: Negative for chest pain.  Gastrointestinal: Negative for abdominal pain and diarrhea.  Genitourinary: Negative for frequency and hematuria.  Musculoskeletal: Negative for back pain.  Skin: Negative for rash.  Neurological: Positive for headaches. Negative for seizures.   Psychiatric/Behavioral: Negative for hallucinations.      Allergies  Ciprofloxacin; Other; Peanut-containing drug products; Aleve; Influenza vaccines; Tetanus toxoids; and Latex  Home Medications   Current Outpatient Rx  Name  Route  Sig  Dispense  Refill  . cinacalcet (SENSIPAR) 30 MG tablet   Oral   Take 30 mg by mouth daily.         Marland Kitchen Epoetin Alfa (EPOGEN IJ)   Injection   Inject as directed. Pt gets on dialysis days which are Tuesday and Saturday         . multivitamin (RENA-VIT) TABS tablet   Oral   Take 1 tablet by mouth daily.         . sevelamer carbonate (RENVELA) 800 MG tablet   Oral   Take 2,400 mg by mouth 3 (three) times daily with meals.         . SODIUM BICARBONATE PO   Oral   Take 3 tablets by mouth daily.         . Vitamin D, Ergocalciferol, (DRISDOL) 50000 UNITS CAPS capsule   Oral   Take 50,000 Units by mouth every 7 (seven) days. Takes on Thursday         . cephALEXin (KEFLEX) 500 MG capsule   Oral   Take 1 capsule (500 mg total) by mouth 3 (three) times daily.   21 capsule   0   .  HYDROcodone-acetaminophen (NORCO/VICODIN) 5-325 MG per tablet   Oral   Take 1 tablet by mouth every 6 (six) hours as needed for moderate pain.   12 tablet   0    BP 112/64  Pulse 99  Temp(Src) 99.4 F (37.4 C) (Oral)  Resp 20  SpO2 98% Physical Exam  Constitutional: She is oriented to person, place, and time. She appears well-developed.  HENT:  Head: Normocephalic.  Eyes: Conjunctivae and EOM are normal. No scleral icterus.  Neck: Neck supple. No thyromegaly present.  Cardiovascular: Normal rate and regular rhythm.  Exam reveals no gallop and no friction rub.   No murmur heard. Pulmonary/Chest: No stridor. She has no wheezes. She has no rales. She exhibits no tenderness.  Abdominal: She exhibits no distension. There is no tenderness. There is no rebound.  Genitourinary:  Mild bilateral flank pain  Musculoskeletal:  Pt has spinabifida and  has no legs  Lymphadenopathy:    She has no cervical adenopathy.  Neurological: She is oriented to person, place, and time. She exhibits normal muscle tone.  Skin: No rash noted. No erythema.  Psychiatric: She has a normal mood and affect. Her behavior is normal.    ED Course  Procedures (including critical care time) Labs Review Labs Reviewed  URINALYSIS, ROUTINE W REFLEX MICROSCOPIC - Abnormal; Notable for the following:    APPearance CLOUDY (*)    pH 8.5 (*)    Hgb urine dipstick SMALL (*)    Protein, ur 100 (*)    Leukocytes, UA MODERATE (*)    All other components within normal limits  URINE MICROSCOPIC-ADD ON - Abnormal; Notable for the following:    Bacteria, UA MANY (*)    All other components within normal limits   Imaging Review No results found.   EKG Interpretation None      MDM   Final diagnoses:  UTI (lower urinary tract infection)      Maudry Diego, MD 10/15/13 2054

## 2013-10-15 NOTE — Discharge Instructions (Signed)
Follow up with your md next week for rechceck

## 2013-10-15 NOTE — ED Notes (Signed)
Pt states she has been having back pain since Tues and headache since Sunday unrelieved by Tylenol and Motrin.

## 2013-10-20 DIAGNOSIS — D631 Anemia in chronic kidney disease: Secondary | ICD-10-CM | POA: Diagnosis not present

## 2013-10-20 DIAGNOSIS — N2581 Secondary hyperparathyroidism of renal origin: Secondary | ICD-10-CM | POA: Diagnosis not present

## 2013-10-20 DIAGNOSIS — N186 End stage renal disease: Secondary | ICD-10-CM | POA: Diagnosis not present

## 2013-10-22 DIAGNOSIS — N039 Chronic nephritic syndrome with unspecified morphologic changes: Secondary | ICD-10-CM | POA: Diagnosis not present

## 2013-10-22 DIAGNOSIS — N186 End stage renal disease: Secondary | ICD-10-CM | POA: Diagnosis not present

## 2013-10-22 DIAGNOSIS — N2581 Secondary hyperparathyroidism of renal origin: Secondary | ICD-10-CM | POA: Diagnosis not present

## 2013-10-22 DIAGNOSIS — D631 Anemia in chronic kidney disease: Secondary | ICD-10-CM | POA: Diagnosis not present

## 2013-10-25 DIAGNOSIS — N186 End stage renal disease: Secondary | ICD-10-CM | POA: Diagnosis not present

## 2013-10-25 DIAGNOSIS — D631 Anemia in chronic kidney disease: Secondary | ICD-10-CM | POA: Diagnosis not present

## 2013-10-25 DIAGNOSIS — N2581 Secondary hyperparathyroidism of renal origin: Secondary | ICD-10-CM | POA: Diagnosis not present

## 2013-10-27 DIAGNOSIS — N2581 Secondary hyperparathyroidism of renal origin: Secondary | ICD-10-CM | POA: Diagnosis not present

## 2013-10-27 DIAGNOSIS — N186 End stage renal disease: Secondary | ICD-10-CM | POA: Diagnosis not present

## 2013-10-27 DIAGNOSIS — D631 Anemia in chronic kidney disease: Secondary | ICD-10-CM | POA: Diagnosis not present

## 2013-11-24 DIAGNOSIS — N186 End stage renal disease: Secondary | ICD-10-CM | POA: Diagnosis not present

## 2013-11-26 DIAGNOSIS — D631 Anemia in chronic kidney disease: Secondary | ICD-10-CM | POA: Diagnosis not present

## 2013-11-26 DIAGNOSIS — N186 End stage renal disease: Secondary | ICD-10-CM | POA: Diagnosis not present

## 2013-11-26 DIAGNOSIS — N2581 Secondary hyperparathyroidism of renal origin: Secondary | ICD-10-CM | POA: Diagnosis not present

## 2013-12-25 DIAGNOSIS — N186 End stage renal disease: Secondary | ICD-10-CM | POA: Diagnosis not present

## 2013-12-27 DIAGNOSIS — N039 Chronic nephritic syndrome with unspecified morphologic changes: Secondary | ICD-10-CM | POA: Diagnosis not present

## 2013-12-27 DIAGNOSIS — N2581 Secondary hyperparathyroidism of renal origin: Secondary | ICD-10-CM | POA: Diagnosis not present

## 2013-12-27 DIAGNOSIS — D631 Anemia in chronic kidney disease: Secondary | ICD-10-CM | POA: Diagnosis not present

## 2013-12-27 DIAGNOSIS — N186 End stage renal disease: Secondary | ICD-10-CM | POA: Diagnosis not present

## 2013-12-29 DIAGNOSIS — N2581 Secondary hyperparathyroidism of renal origin: Secondary | ICD-10-CM | POA: Diagnosis not present

## 2013-12-29 DIAGNOSIS — N039 Chronic nephritic syndrome with unspecified morphologic changes: Secondary | ICD-10-CM | POA: Diagnosis not present

## 2013-12-29 DIAGNOSIS — D631 Anemia in chronic kidney disease: Secondary | ICD-10-CM | POA: Diagnosis not present

## 2013-12-29 DIAGNOSIS — N186 End stage renal disease: Secondary | ICD-10-CM | POA: Diagnosis not present

## 2013-12-31 DIAGNOSIS — N2581 Secondary hyperparathyroidism of renal origin: Secondary | ICD-10-CM | POA: Diagnosis not present

## 2013-12-31 DIAGNOSIS — N186 End stage renal disease: Secondary | ICD-10-CM | POA: Diagnosis not present

## 2013-12-31 DIAGNOSIS — D631 Anemia in chronic kidney disease: Secondary | ICD-10-CM | POA: Diagnosis not present

## 2014-01-03 DIAGNOSIS — D631 Anemia in chronic kidney disease: Secondary | ICD-10-CM | POA: Diagnosis not present

## 2014-01-03 DIAGNOSIS — N2581 Secondary hyperparathyroidism of renal origin: Secondary | ICD-10-CM | POA: Diagnosis not present

## 2014-01-03 DIAGNOSIS — N186 End stage renal disease: Secondary | ICD-10-CM | POA: Diagnosis not present

## 2014-01-05 DIAGNOSIS — D631 Anemia in chronic kidney disease: Secondary | ICD-10-CM | POA: Diagnosis not present

## 2014-01-05 DIAGNOSIS — N186 End stage renal disease: Secondary | ICD-10-CM | POA: Diagnosis not present

## 2014-01-05 DIAGNOSIS — N039 Chronic nephritic syndrome with unspecified morphologic changes: Secondary | ICD-10-CM | POA: Diagnosis not present

## 2014-01-05 DIAGNOSIS — N2581 Secondary hyperparathyroidism of renal origin: Secondary | ICD-10-CM | POA: Diagnosis not present

## 2014-01-07 DIAGNOSIS — D631 Anemia in chronic kidney disease: Secondary | ICD-10-CM | POA: Diagnosis not present

## 2014-01-07 DIAGNOSIS — N2581 Secondary hyperparathyroidism of renal origin: Secondary | ICD-10-CM | POA: Diagnosis not present

## 2014-01-07 DIAGNOSIS — N186 End stage renal disease: Secondary | ICD-10-CM | POA: Diagnosis not present

## 2014-01-10 DIAGNOSIS — D631 Anemia in chronic kidney disease: Secondary | ICD-10-CM | POA: Diagnosis not present

## 2014-01-10 DIAGNOSIS — N2581 Secondary hyperparathyroidism of renal origin: Secondary | ICD-10-CM | POA: Diagnosis not present

## 2014-01-10 DIAGNOSIS — N186 End stage renal disease: Secondary | ICD-10-CM | POA: Diagnosis not present

## 2014-01-12 DIAGNOSIS — D631 Anemia in chronic kidney disease: Secondary | ICD-10-CM | POA: Diagnosis not present

## 2014-01-12 DIAGNOSIS — N186 End stage renal disease: Secondary | ICD-10-CM | POA: Diagnosis not present

## 2014-01-12 DIAGNOSIS — N2581 Secondary hyperparathyroidism of renal origin: Secondary | ICD-10-CM | POA: Diagnosis not present

## 2014-01-14 DIAGNOSIS — N2581 Secondary hyperparathyroidism of renal origin: Secondary | ICD-10-CM | POA: Diagnosis not present

## 2014-01-14 DIAGNOSIS — N186 End stage renal disease: Secondary | ICD-10-CM | POA: Diagnosis not present

## 2014-01-14 DIAGNOSIS — D631 Anemia in chronic kidney disease: Secondary | ICD-10-CM | POA: Diagnosis not present

## 2014-01-17 DIAGNOSIS — D631 Anemia in chronic kidney disease: Secondary | ICD-10-CM | POA: Diagnosis not present

## 2014-01-17 DIAGNOSIS — N039 Chronic nephritic syndrome with unspecified morphologic changes: Secondary | ICD-10-CM | POA: Diagnosis not present

## 2014-01-17 DIAGNOSIS — N2581 Secondary hyperparathyroidism of renal origin: Secondary | ICD-10-CM | POA: Diagnosis not present

## 2014-01-17 DIAGNOSIS — N186 End stage renal disease: Secondary | ICD-10-CM | POA: Diagnosis not present

## 2014-01-19 DIAGNOSIS — N186 End stage renal disease: Secondary | ICD-10-CM | POA: Diagnosis not present

## 2014-01-19 DIAGNOSIS — N2581 Secondary hyperparathyroidism of renal origin: Secondary | ICD-10-CM | POA: Diagnosis not present

## 2014-01-19 DIAGNOSIS — D631 Anemia in chronic kidney disease: Secondary | ICD-10-CM | POA: Diagnosis not present

## 2014-01-21 DIAGNOSIS — N2581 Secondary hyperparathyroidism of renal origin: Secondary | ICD-10-CM | POA: Diagnosis not present

## 2014-01-21 DIAGNOSIS — N186 End stage renal disease: Secondary | ICD-10-CM | POA: Diagnosis not present

## 2014-01-21 DIAGNOSIS — N039 Chronic nephritic syndrome with unspecified morphologic changes: Secondary | ICD-10-CM | POA: Diagnosis not present

## 2014-01-21 DIAGNOSIS — D631 Anemia in chronic kidney disease: Secondary | ICD-10-CM | POA: Diagnosis not present

## 2014-01-24 DIAGNOSIS — N186 End stage renal disease: Secondary | ICD-10-CM | POA: Diagnosis not present

## 2014-01-26 DIAGNOSIS — D631 Anemia in chronic kidney disease: Secondary | ICD-10-CM | POA: Diagnosis not present

## 2014-01-26 DIAGNOSIS — N186 End stage renal disease: Secondary | ICD-10-CM | POA: Diagnosis not present

## 2014-01-26 DIAGNOSIS — N2581 Secondary hyperparathyroidism of renal origin: Secondary | ICD-10-CM | POA: Diagnosis not present

## 2014-01-26 DIAGNOSIS — B965 Pseudomonas (aeruginosa) (mallei) (pseudomallei) as the cause of diseases classified elsewhere: Secondary | ICD-10-CM | POA: Diagnosis not present

## 2014-02-02 ENCOUNTER — Encounter (HOSPITAL_COMMUNITY): Payer: Self-pay | Admitting: Emergency Medicine

## 2014-02-02 ENCOUNTER — Inpatient Hospital Stay (HOSPITAL_COMMUNITY)
Admission: EM | Admit: 2014-02-02 | Discharge: 2014-02-04 | DRG: 690 | Disposition: A | Discharge status: Home / Self Care | Payer: Medicare Other | Attending: Family Medicine | Admitting: Family Medicine

## 2014-02-02 DIAGNOSIS — I120 Hypertensive chronic kidney disease with stage 5 chronic kidney disease or end stage renal disease: Secondary | ICD-10-CM | POA: Diagnosis present

## 2014-02-02 DIAGNOSIS — I10 Essential (primary) hypertension: Secondary | ICD-10-CM | POA: Diagnosis not present

## 2014-02-02 DIAGNOSIS — Z992 Dependence on renal dialysis: Secondary | ICD-10-CM

## 2014-02-02 DIAGNOSIS — Z87440 Personal history of urinary (tract) infections: Secondary | ICD-10-CM

## 2014-02-02 DIAGNOSIS — D638 Anemia in other chronic diseases classified elsewhere: Secondary | ICD-10-CM | POA: Diagnosis present

## 2014-02-02 DIAGNOSIS — N12 Tubulo-interstitial nephritis, not specified as acute or chronic: Secondary | ICD-10-CM | POA: Diagnosis not present

## 2014-02-02 DIAGNOSIS — N10 Acute tubulo-interstitial nephritis: Secondary | ICD-10-CM

## 2014-02-02 DIAGNOSIS — N390 Urinary tract infection, site not specified: Secondary | ICD-10-CM

## 2014-02-02 DIAGNOSIS — N039 Chronic nephritic syndrome with unspecified morphologic changes: Secondary | ICD-10-CM

## 2014-02-02 DIAGNOSIS — D631 Anemia in chronic kidney disease: Secondary | ICD-10-CM | POA: Diagnosis present

## 2014-02-02 DIAGNOSIS — R109 Unspecified abdominal pain: Secondary | ICD-10-CM | POA: Diagnosis not present

## 2014-02-02 DIAGNOSIS — Q059 Spina bifida, unspecified: Secondary | ICD-10-CM | POA: Diagnosis not present

## 2014-02-02 DIAGNOSIS — N319 Neuromuscular dysfunction of bladder, unspecified: Secondary | ICD-10-CM | POA: Diagnosis present

## 2014-02-02 DIAGNOSIS — R110 Nausea: Secondary | ICD-10-CM | POA: Diagnosis not present

## 2014-02-02 DIAGNOSIS — D649 Anemia, unspecified: Secondary | ICD-10-CM | POA: Diagnosis present

## 2014-02-02 DIAGNOSIS — N186 End stage renal disease: Secondary | ICD-10-CM | POA: Diagnosis present

## 2014-02-02 DIAGNOSIS — Z8051 Family history of malignant neoplasm of kidney: Secondary | ICD-10-CM | POA: Diagnosis not present

## 2014-02-02 LAB — URINE MICROSCOPIC-ADD ON

## 2014-02-02 LAB — URINALYSIS, ROUTINE W REFLEX MICROSCOPIC
Bilirubin Urine: NEGATIVE
Glucose, UA: NEGATIVE mg/dL
KETONES UR: NEGATIVE mg/dL
Nitrite: NEGATIVE
PH: 8.5 — AB (ref 5.0–8.0)
Protein, ur: 100 mg/dL — AB
SPECIFIC GRAVITY, URINE: 1.015 (ref 1.005–1.030)
Urobilinogen, UA: 0.2 mg/dL (ref 0.0–1.0)

## 2014-02-02 LAB — BASIC METABOLIC PANEL
Anion gap: 16 — ABNORMAL HIGH (ref 5–15)
BUN: 37 mg/dL — ABNORMAL HIGH (ref 6–23)
CO2: 26 meq/L (ref 19–32)
Calcium: 10.2 mg/dL (ref 8.4–10.5)
Chloride: 99 mEq/L (ref 96–112)
Creatinine, Ser: 6.42 mg/dL — ABNORMAL HIGH (ref 0.50–1.10)
GFR calc non Af Amer: 8 mL/min — ABNORMAL LOW (ref >90)
GFR, EST AFRICAN AMERICAN: 10 mL/min — AB (ref >90)
Glucose, Bld: 82 mg/dL (ref 70–99)
Potassium: 4.1 mEq/L (ref 3.7–5.3)
SODIUM: 141 meq/L (ref 137–147)

## 2014-02-02 LAB — CBC WITH DIFFERENTIAL/PLATELET
BASOS ABS: 0 10*3/uL (ref 0.0–0.1)
BASOS PCT: 0 % (ref 0–1)
Eosinophils Absolute: 0.2 10*3/uL (ref 0.0–0.7)
Eosinophils Relative: 6 % — ABNORMAL HIGH (ref 0–5)
HCT: 35.6 % — ABNORMAL LOW (ref 36.0–46.0)
Hemoglobin: 11.5 g/dL — ABNORMAL LOW (ref 12.0–15.0)
LYMPHS PCT: 38 % (ref 12–46)
Lymphs Abs: 1.6 10*3/uL (ref 0.7–4.0)
MCH: 30.7 pg (ref 26.0–34.0)
MCHC: 32.3 g/dL (ref 30.0–36.0)
MCV: 94.9 fL (ref 78.0–100.0)
Monocytes Absolute: 0.8 10*3/uL (ref 0.1–1.0)
Monocytes Relative: 19 % — ABNORMAL HIGH (ref 3–12)
NEUTROS PCT: 37 % — AB (ref 43–77)
Neutro Abs: 1.5 10*3/uL — ABNORMAL LOW (ref 1.7–7.7)
Platelets: 219 10*3/uL (ref 150–400)
RBC: 3.75 MIL/uL — ABNORMAL LOW (ref 3.87–5.11)
RDW: 14 % (ref 11.5–15.5)
WBC: 4.1 10*3/uL (ref 4.0–10.5)

## 2014-02-02 LAB — I-STAT CG4 LACTIC ACID, ED: Lactic Acid, Venous: 0.83 mmol/L (ref 0.5–2.2)

## 2014-02-02 MED ORDER — ONDANSETRON HCL 4 MG/2ML IJ SOLN
4.0000 mg | Freq: Four times a day (QID) | INTRAMUSCULAR | Status: DC | PRN
  Administered 2014-02-03 – 2014-02-04 (×2): 4 mg via INTRAVENOUS
  Filled 2014-02-02 (×2): qty 2

## 2014-02-02 MED ORDER — VITAMIN D (ERGOCALCIFEROL) 1.25 MG (50000 UNIT) PO CAPS
50000.0000 [IU] | ORAL_CAPSULE | ORAL | Status: DC
  Filled 2014-02-02: qty 1

## 2014-02-02 MED ORDER — ACETAMINOPHEN 325 MG PO TABS
650.0000 mg | ORAL_TABLET | Freq: Once | ORAL | Status: AC
  Administered 2014-02-02: 650 mg via ORAL
  Filled 2014-02-02: qty 2

## 2014-02-02 MED ORDER — ONDANSETRON HCL 4 MG/2ML IJ SOLN
INTRAMUSCULAR | Status: AC
  Administered 2014-02-02: 4 mg via INTRAVENOUS
  Filled 2014-02-02: qty 2

## 2014-02-02 MED ORDER — TRAMADOL HCL 50 MG PO TABS
50.0000 mg | ORAL_TABLET | Freq: Once | ORAL | Status: AC
  Administered 2014-02-02: 50 mg via ORAL
  Filled 2014-02-02: qty 1

## 2014-02-02 MED ORDER — SODIUM BICARBONATE 650 MG PO TABS
325.0000 mg | ORAL_TABLET | Freq: Three times a day (TID) | ORAL | Status: DC
  Administered 2014-02-02 – 2014-02-04 (×4): 325 mg via ORAL
  Filled 2014-02-02 (×4): qty 1

## 2014-02-02 MED ORDER — SODIUM CHLORIDE 0.9 % IV SOLN
INTRAVENOUS | Status: DC
  Administered 2014-02-02: 1000 mL via INTRAVENOUS

## 2014-02-02 MED ORDER — HEPARIN SODIUM (PORCINE) 5000 UNIT/ML IJ SOLN
5000.0000 [IU] | Freq: Three times a day (TID) | INTRAMUSCULAR | Status: DC
  Administered 2014-02-02: 5000 [IU] via SUBCUTANEOUS
  Filled 2014-02-02: qty 1

## 2014-02-02 MED ORDER — ONDANSETRON HCL 4 MG PO TABS: 4.0000 mg | ORAL_TABLET | Freq: Four times a day (QID) | ORAL | Status: DC | PRN

## 2014-02-02 MED ORDER — ONDANSETRON HCL 4 MG/2ML IJ SOLN
4.0000 mg | Freq: Once | INTRAMUSCULAR | Status: AC
  Administered 2014-02-02: 4 mg via INTRAVENOUS

## 2014-02-02 MED ORDER — CINACALCET HCL 30 MG PO TABS
30.0000 mg | ORAL_TABLET | Freq: Every day | ORAL | Status: DC
  Administered 2014-02-03 – 2014-02-04 (×2): 30 mg via ORAL
  Filled 2014-02-02 (×4): qty 1

## 2014-02-02 MED ORDER — ONDANSETRON HCL 4 MG/2ML IJ SOLN
4.0000 mg | Freq: Once | INTRAMUSCULAR | Status: DC
Start: 2014-02-02 — End: 2014-02-02

## 2014-02-02 MED ORDER — SODIUM CHLORIDE 0.9 % IV SOLN
INTRAVENOUS | Status: DC
  Administered 2014-02-02 – 2014-02-03 (×2): via INTRAVENOUS

## 2014-02-02 MED ORDER — DEXTROSE 5 % IV SOLN
1.0000 g | INTRAVENOUS | Status: DC
  Administered 2014-02-02 – 2014-02-03 (×2): 1 g via INTRAVENOUS
  Filled 2014-02-02 (×4): qty 10

## 2014-02-02 MED ORDER — RENA-VITE PO TABS
1.0000 | ORAL_TABLET | Freq: Every day | ORAL | Status: DC
  Administered 2014-02-04: 1 via ORAL
  Filled 2014-02-02: qty 1

## 2014-02-02 MED ORDER — SEVELAMER CARBONATE 800 MG PO TABS
2400.0000 mg | ORAL_TABLET | Freq: Three times a day (TID) | ORAL | Status: DC
  Administered 2014-02-03 – 2014-02-04 (×4): 2400 mg via ORAL
  Filled 2014-02-02 (×4): qty 3

## 2014-02-02 NOTE — ED Notes (Signed)
Pt states intermittent back pain and vomiting x 2 days also states headaches and dizziness. NAD.

## 2014-02-02 NOTE — H&P (Signed)
[facility] History and Physical  [name] [id number] DOB: 10/15/1990 DOA: 02/02/2014  Referring physician: ER. PCP: No PCP Per Patient   Chief Complaint: Back pain, nausea   HPI: [name] is a 23 y.o. female  This is a 23 year old lady who has end-stage renal disease and is on hemodialysis and also has spina bifida presents once again with neck pain and nausea, symptoms typically when she gets a UTI. She denies any subjective fever. Her last admission was in October 2014. She denies any fever. Evaluation in the emergency room is consistent with UTI and clinically with pyelonephritis. She is now being admitted for further management.   Review of Systems:  Constitutional:  No weight loss, night sweats HEENT:  No headaches, Difficulty swallowing,Tooth/dental problems,Sore throat,  No sneezing, itching, ear ache, nasal congestion, post nasal drip,  Cardio-vascular:  No chest pain, Orthopnea, PND, swelling in lower extremities, anasarca, dizziness, palpitations  GI:  No heartburn, indigestion, abdominal pain, nausea, vomiting, diarrhea, change in bowel habits, loss of appetite  Resp:  No shortness of breath with exertion or at rest. No excess mucus, no productive cough, No non-productive cough, No coughing up of blood.No change in color of mucus.No wheezing.No chest wall deformity  Skin:  no rash or lesions.   Musculoskeletal:  No joint pain or swelling. No decreased range of motion. No back pain.  Psych:  No change in mood or affect. No depression or anxiety. No memory loss.   Past Medical History  Diagnosis Date  . Spina bifida   . UTI (lower urinary tract infection)   . HTN (hypertension) 05/02/2011  . Anemia associated with chronic renal failure   . 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  . Blood transfusion   . Dialysis care   . ESRD (end stage renal disease) on dialysis    Past Surgical History  Procedure Laterality Date  . Av  fistula placement      left arm   Social History:  reports that she has never smoked. She does not have any smokeless tobacco history on file. She reports that she does not drink alcohol or use illicit drugs.  Allergies  Allergen Reactions  . Ciprofloxacin Shortness Of Breath, Nausea And Vomiting and Other (See Comments)    HIGH FEVER  . Other Anaphylaxis    Revaclear dialzer  . Peanut-Containing Drug Products Anaphylaxis  . Aleve [Naproxen Sodium] Other (See Comments)    G.I.Bleed  . Influenza Vaccines Nausea And Vomiting    High fever  . Tetanus Toxoids Nausea And Vomiting and Other (See Comments)    HIGH FEVER  . Latex Itching and Rash    Family History  Problem Relation Age of Onset  . Kidney cancer Other      Prior to Admission medications   Medication Sig Start Date End Date Taking? Authorizing Provider  cinacalcet (SENSIPAR) 30 MG tablet Take 30 mg by mouth daily.   Yes Historical Provider, MD  Epoetin Alfa (EPOGEN IJ) Inject as directed. Pt gets on dialysis days which are Tuesday and Saturday   Yes Historical Provider, MD  multivitamin (RENA-VIT) TABS tablet Take 1 tablet by mouth daily.   Yes Historical Provider, MD  sevelamer carbonate (RENVELA) 800 MG tablet Take 2,400 mg by mouth 3 (three) times daily with meals.   Yes Historical Provider, MD  SODIUM BICARBONATE PO Take 3 tablets by mouth daily.   Yes Historical Provider, MD  Vitamin D, Ergocalciferol, (DRISDOL)  50000 UNITS CAPS capsule Take 50,000 Units by mouth every 7 (seven) days. Takes on Thursday   Yes Historical Provider, MD   Physical Exam: Filed Vitals:   02/02/14 1844  BP: 106/66  Pulse: 85  Temp: 98.3 F (36.8 C)  Resp:     BP 106/66  Pulse 85  Temp(Src) 98.3 F (36.8 C) (Oral)  Resp 19  Ht 3' (0.914 m)  Wt 27.216 kg (60 lb)  BMI 32.58 kg/m2  SpO2 100%  General:  Appears calm and comfortable. She does not look toxic or septic clinically. Eyes: PERRL, normal lids, irises &  conjunctiva ENT: grossly normal hearing, lips & tongue Neck: no LAD, masses or thyromegaly Cardiovascular: RRR, no m/r/g. No LE edema. Telemetry: SR, no arrhythmias  Respiratory: CTA bilaterally, no w/r/r. Normal respiratory effort. Abdomen: soft, ntnd Skin: no rash or induration seen on limited exam Musculoskeletal: grossly normal tone BUE/BLE Psychiatric: grossly normal mood and affect, speech fluent and appropriate Neurologic: grossly non-focal.          Labs on Admission:  Basic Metabolic Panel:  Recent Labs Lab 02/02/14 1604  NA 141  K 4.1  CL 99  CO2 26  GLUCOSE 82  BUN 37*  CREATININE 6.42*  CALCIUM 10.2   Liver Function Tests: No results found for this basename: AST, ALT, ALKPHOS, BILITOT, PROT, ALBUMIN,  in the last 168 hours No results found for this basename: LIPASE, AMYLASE,  in the last 168 hours No results found for this basename: AMMONIA,  in the last 168 hours CBC:  Recent Labs Lab 02/02/14 1604  WBC 4.1  NEUTROABS 1.5*  HGB 11.5*  HCT 35.6*  MCV 94.9  PLT 219   Cardiac Enzymes: No results found for this basename: CKTOTAL, CKMB, CKMBINDEX, TROPONINI,  in the last 168 hours  BNP (last 3 results) No results found for this basename: PROBNP,  in the last 8760 hours CBG: No results found for this basename: GLUCAP,  in the last 168 hours  Radiological Exams on Admission: No results found.    Assessment/Plan   1. UTI, probably pyelonephritis. 2. End-stage renal disease on hemodialysis. 3. Hypertension. 4. Spina bifida.  Plan: 1. Admit to medical floor. 2. Intravenous antibiotics. Intravenous antiemetics and analgesia as required. 3. Urine culture is already been sent. 4. IV fluids.  Further recommendations will depend on patient's hospital progress.   Code Status: Full code.  Family Communication: I discussed the plan with patient at the bedside.   Disposition Plan: Home when medically stable.   Time spent: 60  minutes.  Doree Albee [facility] Pager [phone].  **Disclaimer: This note may have been dictated with voice recognition software. Similar sounding words can inadvertently be transcribed and this note may contain transcription errors which may not have been corrected upon publication of note.**

## 2014-02-02 NOTE — ED Provider Notes (Signed)
CSN: [id number]     Arrival date & time 02/02/14  1441 History  This chart was scribed for Leota Jacobsen, MD by Elby Beck, ED Scribe. This patient was seen in room APA10/APA10 and the patient's care was started at 3:45 PM.   Chief Complaint  Patient presents with  . Emesis    The history is provided by the patient. No language interpreter was used.    HPI Comments: [name] is a 23 y.o. female who presents to the Emergency Department complaining of intermittent, moderate bilateral  lower back pain over the past 2 days. She reports associated headache, dizziness and episodes of emesis. She states that she last had episodes of emesis yesterday. She also states that her urine has been "cloudy" over the past 2 days. She states that she has had similar symptoms in the past with prior UTIs. She states that she is dialyzed on Tuesdays, Thursdays and Saturdays. She missed today's appointment. She states that she still makes her own urine.  She states that she has not tried any medications for her pain. She denies any associated fever, chills, cough, congestion or any other symptoms.    Past Medical History  Diagnosis Date  . Spina bifida   . UTI (lower urinary tract infection)   . HTN (hypertension) 05/02/2011  . Anemia associated with chronic renal failure   . 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  . Blood transfusion   . Dialysis care   . ESRD (end stage renal disease) on dialysis    Past Surgical History  Procedure Laterality Date  . Av fistula placement      left arm   Family History  Problem Relation Age of Onset  . Kidney cancer Other    History  Substance Use Topics  . Smoking status: Never Smoker   . Smokeless tobacco: Not on file  . Alcohol Use: No   OB History   Grav Para Term Preterm Abortions TAB SAB Ect Mult Living                 Review of Systems  Constitutional: Negative for fever and chills.  HENT: Negative for congestion.    Respiratory: Negative for cough.   Musculoskeletal: Positive for back pain.  Neurological: Positive for dizziness and headaches.  All other systems reviewed and are negative.   Allergies  Ciprofloxacin; Other; Peanut-containing drug products; Aleve; Influenza vaccines; Tetanus toxoids; and Latex  Home Medications   Prior to Admission medications   Medication Sig Start Date End Date Taking? Authorizing Provider  cinacalcet (SENSIPAR) 30 MG tablet Take 30 mg by mouth daily.   Yes Historical Provider, MD  Epoetin Alfa (EPOGEN IJ) Inject as directed. Pt gets on dialysis days which are Tuesday and Saturday   Yes Historical Provider, MD  multivitamin (RENA-VIT) TABS tablet Take 1 tablet by mouth daily.   Yes Historical Provider, MD  sevelamer carbonate (RENVELA) 800 MG tablet Take 2,400 mg by mouth 3 (three) times daily with meals.   Yes Historical Provider, MD  SODIUM BICARBONATE PO Take 3 tablets by mouth daily.   Yes Historical Provider, MD  Vitamin D, Ergocalciferol, (DRISDOL) 50000 UNITS CAPS capsule Take 50,000 Units by mouth every 7 (seven) days. Takes on Thursday   Yes Historical Provider, MD   Triage Vitals: BP 117/70  Pulse 109  Temp(Src) 98.9 F (37.2 C) (Oral)  Resp 19  Wt 60 lb (27.216 kg)  SpO2 99%  Physical  Exam  Nursing note and vitals reviewed. Constitutional: She is oriented to person, place, and time. She appears well-developed and well-nourished.  Non-toxic appearance. No distress.  HENT:  Head: Normocephalic and atraumatic.  Eyes: Conjunctivae, EOM and lids are normal. Pupils are equal, round, and reactive to light.  Neck: Normal range of motion. Neck supple. No tracheal deviation present. No mass present.  Cardiovascular: Normal rate, regular rhythm and normal heart sounds.  Exam reveals no gallop.   No murmur heard. Pulmonary/Chest: Effort normal and breath sounds normal. No stridor. No respiratory distress. She has no decreased breath sounds. She has no  wheezes. She has no rhonchi. She has no rales.  Abdominal: Soft. Normal appearance and bowel sounds are normal. She exhibits no distension. There is no tenderness. There is no rebound and no CVA tenderness.  Genitourinary:  Mild CVA tenderness bilaterally  Musculoskeletal: Normal range of motion. She exhibits no edema and no tenderness.  Neurological: She is alert and oriented to person, place, and time. She has normal strength. No cranial nerve deficit or sensory deficit. GCS eye subscore is 4. GCS verbal subscore is 5. GCS motor subscore is 6.  Skin: Skin is warm and dry. No abrasion and no rash noted.  Psychiatric: She has a normal mood and affect. Her speech is normal and behavior is normal.    ED Course  Procedures (including critical care time)  DIAGNOSTIC STUDIES: Oxygen Saturation is 99% on RA, normal by my interpretation.    COORDINATION OF CARE: 3:48 PM- Pt advised of plan for treatment and pt agrees.  Labs Review Labs Reviewed - No data to display  Imaging Review No results found.   EKG Interpretation None      MDM   Final diagnoses:  None    I personally performed the services described in this documentation, which was scribed in my presence. The recorded information has been reviewed and is accurate.  Patient to be treated with Rocephin and admitted to the medicine service.  Leota Jacobsen, MD 02/02/14 [phone]

## 2014-02-02 NOTE — ED Notes (Signed)
MD at bedside. 

## 2014-02-03 DIAGNOSIS — Z992 Dependence on renal dialysis: Secondary | ICD-10-CM | POA: Diagnosis not present

## 2014-02-03 DIAGNOSIS — N186 End stage renal disease: Secondary | ICD-10-CM | POA: Diagnosis not present

## 2014-02-03 DIAGNOSIS — Q059 Spina bifida, unspecified: Secondary | ICD-10-CM | POA: Diagnosis not present

## 2014-02-03 DIAGNOSIS — N390 Urinary tract infection, site not specified: Secondary | ICD-10-CM | POA: Diagnosis not present

## 2014-02-03 DIAGNOSIS — N10 Acute tubulo-interstitial nephritis: Secondary | ICD-10-CM | POA: Diagnosis not present

## 2014-02-03 DIAGNOSIS — D631 Anemia in chronic kidney disease: Secondary | ICD-10-CM | POA: Diagnosis not present

## 2014-02-03 DIAGNOSIS — I10 Essential (primary) hypertension: Secondary | ICD-10-CM | POA: Diagnosis not present

## 2014-02-03 LAB — COMPREHENSIVE METABOLIC PANEL
ALT: 8 U/L (ref 0–35)
AST: 14 U/L (ref 0–37)
Albumin: 3.3 g/dL — ABNORMAL LOW (ref 3.5–5.2)
Alkaline Phosphatase: 55 U/L (ref 39–117)
Anion gap: 19 — ABNORMAL HIGH (ref 5–15)
BUN: 39 mg/dL — ABNORMAL HIGH (ref 6–23)
CO2: 22 meq/L (ref 19–32)
CREATININE: 6.59 mg/dL — AB (ref 0.50–1.10)
Calcium: 8.9 mg/dL (ref 8.4–10.5)
Chloride: 103 mEq/L (ref 96–112)
GFR calc Af Amer: 9 mL/min — ABNORMAL LOW (ref >90)
GFR, EST NON AFRICAN AMERICAN: 8 mL/min — AB (ref >90)
GLUCOSE: 85 mg/dL (ref 70–99)
Potassium: 4.2 mEq/L (ref 3.7–5.3)
SODIUM: 144 meq/L (ref 137–147)
Total Protein: 7.1 g/dL (ref 6.0–8.3)

## 2014-02-03 LAB — CBC
HCT: 32.1 % — ABNORMAL LOW (ref 36.0–46.0)
Hemoglobin: 10.3 g/dL — ABNORMAL LOW (ref 12.0–15.0)
MCH: 30.5 pg (ref 26.0–34.0)
MCHC: 32.1 g/dL (ref 30.0–36.0)
MCV: 95 fL (ref 78.0–100.0)
Platelets: 205 10*3/uL (ref 150–400)
RBC: 3.38 MIL/uL — ABNORMAL LOW (ref 3.87–5.11)
RDW: 14.1 % (ref 11.5–15.5)
WBC: 4.1 10*3/uL (ref 4.0–10.5)

## 2014-02-03 MED ORDER — LIDOCAINE HCL (PF) 1 % IJ SOLN: 5.0000 mL | INTRAMUSCULAR | Status: DC | PRN

## 2014-02-03 MED ORDER — EPOETIN ALFA 10000 UNIT/ML IJ SOLN
10000.0000 [IU] | INTRAMUSCULAR | Status: DC
  Administered 2014-02-03: 10000 [IU] via INTRAVENOUS
  Filled 2014-02-03 (×3): qty 1

## 2014-02-03 MED ORDER — SODIUM CHLORIDE 0.9 % IV SOLN: 100.0000 mL | INTRAVENOUS | Status: DC | PRN

## 2014-02-03 MED ORDER — ALTEPLASE 2 MG IJ SOLR
2.0000 mg | Freq: Once | INTRAMUSCULAR | Status: DC | PRN
  Filled 2014-02-03: qty 2

## 2014-02-03 MED ORDER — PENTAFLUOROPROP-TETRAFLUOROETH EX AERO
1.0000 "application " | INHALATION_SPRAY | CUTANEOUS | Status: DC | PRN
  Filled 2014-02-03: qty 103.5

## 2014-02-03 MED ORDER — DIPHENHYDRAMINE HCL 25 MG PO CAPS
25.0000 mg | ORAL_CAPSULE | Freq: Once | ORAL | Status: AC
  Administered 2014-02-03: 25 mg via ORAL
  Filled 2014-02-03: qty 1

## 2014-02-03 MED ORDER — NEPRO/CARBSTEADY PO LIQD
237.0000 mL | ORAL | Status: DC | PRN
  Filled 2014-02-03: qty 237

## 2014-02-03 MED ORDER — HEPARIN SODIUM (PORCINE) 1000 UNIT/ML DIALYSIS
1000.0000 [IU] | INTRAMUSCULAR | Status: DC | PRN
  Filled 2014-02-03: qty 1

## 2014-02-03 MED ORDER — LIDOCAINE-PRILOCAINE 2.5-2.5 % EX CREA: 1.0000 "application " | TOPICAL_CREAM | CUTANEOUS | Status: DC | PRN

## 2014-02-03 NOTE — Progress Notes (Signed)
Patient seen, independently examined and chart reviewed. I agree with exam, assessment and plan discussed with Dyanne Carrel, NP.  23 year old woman with history of spina bifida, end-stage renal disease on hemodialysis presented with symptoms suggestive of UTI. She was admitted for pyelonephritis and UTI.  PMH Spinal bifida  ESRD on HD TTS  Anemia of chronic disease secondary to chronic renal failure  Subjective: Feels much better today. No nausea or vomiting. No abdominal pain.  Objective:Afebrile, vital signs are stable. No hypoxia.   Gen. Appears calm and comfortable.  Psych. Alert. Grossly normal mentation.  Cardiovascular. Regular rate and rhythm. No murmur, rub or gallop.  Respiratory. Clear to auscultation bilaterally. No wheezes, rales or rhonchi. Normal respiratory effort.   Chemistry: Basic metabolic panel consistent with end-stage renal disease.   Heme: CBC unremarkable   ID: Urinalysis grossly positive. Blood and urine cultures pending.   UTI, suspected pyelonephritis, much improved symptomatically today. Continue antibiotics, follow cultures.  End-stage renal disease on hemodialysis.  Spina bifida.  Murray Hodgkins, MD [facility] [phone]

## 2014-02-03 NOTE — Procedures (Signed)
   HEMODIALYSIS TREATMENT NOTE:  3 hour heparin-free dialysis completed via left upper arm AVF (16g/antegrade).  Kept even / no fluid removal.  Tolerated HD without problems.  All blood was reinfused.  Hemostasis was achieved within 5 minutes.  Report given to [place], Therapist, sports.  Rockwell [place], RN, CDN

## 2014-02-03 NOTE — Progress Notes (Signed)
UR chart review completed.  

## 2014-02-03 NOTE — Progress Notes (Signed)
[facility] PROGRESS NOTE  [name] [id number] DOB: [birth year] DOA: 02/02/2014 PCP: No PCP Per Patient  Summary: She is a patient who has history of hypertension, spina bifida, end-stage renal disease on hemodialysis presented with left-sided flank pain and some nausea. Initial evaluation revealed urinary tract infection. Patient with hx recurrent UTI and frequent pyelonephritis.   Assessment/Plan: 1. UTI/abdominal/back pain, probably pyelonephritis. Likely related to neurogenic bladder and need to in and out cath. Less pain this am. Blood culture pending.   Urine culture pending. Prior cultures consistent with E coli. Continue rocephin day #2. She is afebrile and non-toxic appearing.  2. End-stage renal disease on hemodialysis. Appreciate Dr Lowanda Foster assistance. Scheduled for dialysis today.  3. Hypertension. Controlled. No antihypertensive meds at home. monitor 4. Spina bifida: stable at baseline.    Code Status: full Family Communication: none present Disposition Plan: home hopefully tomorrow   Consultants:  Dr Lowanda Foster nephrology  Procedures:  none  Antibiotics:  Rocephin 02/02/14>>  HPI/Subjective: Sitting up in bed eating toast. Reports feeling "alot better" this am. Denies back pain. Denies nausea.   Objective: Filed Vitals:   02/03/14 0622  BP: 115/69  Pulse: 94  Temp: 97.9 F (36.6 C)  Resp: 18    Intake/Output Summary (Last 24 hours) at 02/03/14 1008 Last data filed at 02/03/14 0558  Gross per 24 hour  Intake 1531.67 ml  Output    100 ml  Net 1431.67 ml   Filed Weights   02/02/14 1446 02/02/14 1844 02/03/14 0500  Weight: 27.216 kg (60 lb) 27.216 kg (60 lb) 30.754 kg (67 lb 12.8 oz)    Exam:   General:  Calm appears comfortable  Cardiovascular: RRR No MGR No LE edema  Respiratory: normal effort BS clear bilaterally no wheeze no rhonchi  Abdomen: non-distended, soft +BS non-tender  Musculoskeletal:  LE with poor muscle tone  and deformities. No clubbing or cyanosis  Data Reviewed: Basic Metabolic Panel:  Recent Labs Lab 02/02/14 1604 02/03/14 0536  NA 141 144  K 4.1 4.2  CL 99 103  CO2 26 22  GLUCOSE 82 85  BUN 37* 39*  CREATININE 6.42* 6.59*  CALCIUM 10.2 8.9   Liver Function Tests:  Recent Labs Lab 02/03/14 0536  AST 14  ALT 8  ALKPHOS 55  BILITOT <0.2*  PROT 7.1  ALBUMIN 3.3*   No results found for this basename: LIPASE, AMYLASE,  in the last 168 hours No results found for this basename: AMMONIA,  in the last 168 hours CBC:  Recent Labs Lab 02/02/14 1604 02/03/14 0536  WBC 4.1 4.1  NEUTROABS 1.5*  --   HGB 11.5* 10.3*  HCT 35.6* 32.1*  MCV 94.9 95.0  PLT 219 205   Cardiac Enzymes: No results found for this basename: CKTOTAL, CKMB, CKMBINDEX, TROPONINI,  in the last 168 hours BNP (last 3 results) No results found for this basename: PROBNP,  in the last 8760 hours CBG: No results found for this basename: GLUCAP,  in the last 168 hours  Recent Results (from the past 240 hour(s))  CULTURE, BLOOD (ROUTINE X 2)     Status: None   Collection Time    02/02/14  4:04 PM      Result Value Ref Range Status   Specimen Description BLOOD RIGHT FOREARM DRAWN BY RN   Final   Special Requests BOTTLES DRAWN AEROBIC ONLY [place]   Final   Culture NO GROWTH <24 HRS   Final   Report Status PENDING  Incomplete  CULTURE, BLOOD (ROUTINE X 2)     Status: None   Collection Time    02/02/14  4:12 PM      Result Value Ref Range Status   Specimen Description BLOOD RIGHT HAND   Final   Special Requests BOTTLES DRAWN AEROBIC AND ANAEROBIC [place]   Final   Culture NO GROWTH <24 HRS   Final   Report Status PENDING   Incomplete     Studies: No results found.  Scheduled Meds: . cefTRIAXone (ROCEPHIN)  IV  1 g Intravenous Q24H  . cinacalcet  30 mg Oral Q breakfast  . epoetin alfa  10,000 Units Intravenous Once per day on Mon Wed Fri  . heparin  5,000 Units Subcutaneous 3 times per day  .  multivitamin  1 tablet Oral Daily  . sevelamer carbonate  2,400 mg Oral TID WC  . sodium bicarbonate  325 mg Oral TID  . [START ON 02/08/2014] Vitamin D (Ergocalciferol)  50,000 Units Oral Q7 days   Continuous Infusions:   Active Problems:   ESRD (end stage renal disease) on dialysis   HTN (hypertension)   Spina bifida   Pyelonephritis    Time spent: 27 minutes    [facility] Pager [phone]. If 7PM-7AM, please contact night-coverage at [email or web address], password [facility] 02/03/2014, 10:08 AM  LOS: 1 day

## 2014-02-03 NOTE — Consult Note (Signed)
Reason for Consult: End-stage renal disease Referring Physician: Dr. Boyce [name] is an 23 y.o. female.  HPI: She is a patient who has history of hypertension, spina bifida, end-stage renal disease on maintenance hemodialysis presently came with left-sided flank pain and some nausea. When she was evaluated she was told to have urinary tract infection admitted for further treatment. Presently patient is still that she's feeling better as compared to yesterday. She has still some pain but no nausea no vomiting. Patient also denies any difficulty breathing.  Past Medical History  Diagnosis Date  . Spina bifida   . UTI (lower urinary tract infection)   . HTN (hypertension) 05/02/2011  . Anemia associated with chronic renal failure   . 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  . Blood transfusion   . Dialysis care   . ESRD (end stage renal disease) on dialysis     Past Surgical History  Procedure Laterality Date  . Av fistula placement      left arm    Family History  Problem Relation Age of Onset  . Kidney cancer Other     Social History:  reports that she has never smoked. She does not have any smokeless tobacco history on file. She reports that she does not drink alcohol or use illicit drugs.  Allergies:  Allergies  Allergen Reactions  . Ciprofloxacin Shortness Of Breath, Nausea And Vomiting and Other (See Comments)    HIGH FEVER  . Other Anaphylaxis    Revaclear dialzer  . Peanut-Containing Drug Products Anaphylaxis  . Aleve [Naproxen Sodium] Other (See Comments)    G.I.Bleed  . Influenza Vaccines Nausea And Vomiting    High fever  . Tetanus Toxoids Nausea And Vomiting and Other (See Comments)    HIGH FEVER  . Latex Itching and Rash    Medications: I have reviewed the patient's current medications.  Results for orders placed during the hospital encounter of 02/02/14 (from the past 48 hour(s))  CBC WITH DIFFERENTIAL     Status: Abnormal    Collection Time    02/02/14  4:04 PM      Result Value Ref Range   WBC 4.1  4.0 - 10.5 K/uL   RBC 3.75 (*) 3.87 - 5.11 MIL/uL   Hemoglobin 11.5 (*) 12.0 - 15.0 g/dL   HCT 35.6 (*) 36.0 - 46.0 %   MCV 94.9  78.0 - 100.0 fL   MCH 30.7  26.0 - 34.0 pg   MCHC 32.3  30.0 - 36.0 g/dL   RDW 14.0  11.5 - 15.5 %   Platelets 219  150 - 400 K/uL   Neutrophils Relative % 37 (*) 43 - 77 %   Neutro Abs 1.5 (*) 1.7 - 7.7 K/uL   Lymphocytes Relative 38  12 - 46 %   Lymphs Abs 1.6  0.7 - 4.0 K/uL   Monocytes Relative 19 (*) 3 - 12 %   Monocytes Absolute 0.8  0.1 - 1.0 K/uL   Eosinophils Relative 6 (*) 0 - 5 %   Eosinophils Absolute 0.2  0.0 - 0.7 K/uL   Basophils Relative 0  0 - 1 %   Basophils Absolute 0.0  0.0 - 0.1 K/uL  BASIC METABOLIC PANEL     Status: Abnormal   Collection Time    02/02/14  4:04 PM      Result Value Ref Range   Sodium 141  137 - 147 mEq/L   Potassium 4.1  3.7 - 5.3 mEq/L   Chloride 99  96 - 112 mEq/L   CO2 26  19 - 32 mEq/L   Glucose, Bld 82  70 - 99 mg/dL   BUN 37 (*) 6 - 23 mg/dL   Creatinine, Ser 6.42 (*) 0.50 - 1.10 mg/dL   Calcium 10.2  8.4 - 10.5 mg/dL   GFR calc non Af Amer 8 (*) >90 mL/min   GFR calc Af Amer 10 (*) >90 mL/min   Comment: (NOTE)     The eGFR has been calculated using the CKD EPI equation.     This calculation has not been validated in all clinical situations.     eGFR's persistently <90 mL/min signify possible Chronic Kidney     Disease.   Anion gap 16 (*) 5 - 15  URINALYSIS, ROUTINE W REFLEX MICROSCOPIC     Status: Abnormal   Collection Time    02/02/14  4:04 PM      Result Value Ref Range   Color, Urine YELLOW  YELLOW   APPearance TURBID (*) CLEAR   Specific Gravity, Urine 1.015  1.005 - 1.030   pH 8.5 (*) 5.0 - 8.0   Glucose, UA NEGATIVE  NEGATIVE mg/dL   Hgb urine dipstick SMALL (*) NEGATIVE   Bilirubin Urine NEGATIVE  NEGATIVE   Ketones, ur NEGATIVE  NEGATIVE mg/dL   Protein, ur 100 (*) NEGATIVE mg/dL   Urobilinogen, UA 0.2  0.0  - 1.0 mg/dL   Nitrite NEGATIVE  NEGATIVE   Leukocytes, UA LARGE (*) NEGATIVE  CULTURE, BLOOD (ROUTINE X 2)     Status: None   Collection Time    02/02/14  4:04 PM      Result Value Ref Range   Specimen Description BLOOD RIGHT FOREARM DRAWN BY RN     Special Requests BOTTLES DRAWN AEROBIC ONLY 6CC     Culture NO GROWTH <24 HRS     Report Status PENDING    URINE MICROSCOPIC-ADD ON     Status: Abnormal   Collection Time    02/02/14  4:04 PM      Result Value Ref Range   Squamous Epithelial / LPF RARE  RARE   WBC, UA TOO NUMEROUS TO COUNT  <3 WBC/hpf   RBC / HPF 3-6  <3 RBC/hpf   Bacteria, UA MANY (*) RARE  CULTURE, BLOOD (ROUTINE X 2)     Status: None   Collection Time    02/02/14  4:12 PM      Result Value Ref Range   Specimen Description BLOOD RIGHT HAND     Special Requests BOTTLES DRAWN AEROBIC AND ANAEROBIC [place]     Culture NO GROWTH <24 HRS     Report Status PENDING    I-STAT CG4 LACTIC ACID, ED     Status: None   Collection Time    02/02/14  4:18 PM      Result Value Ref Range   Lactic Acid, Venous 0.83  0.5 - 2.2 mmol/L  COMPREHENSIVE METABOLIC PANEL     Status: Abnormal   Collection Time    02/03/14  5:36 AM      Result Value Ref Range   Sodium 144  137 - 147 mEq/L   Potassium 4.2  3.7 - 5.3 mEq/L   Chloride 103  96 - 112 mEq/L   CO2 22  19 - 32 mEq/L   Glucose, Bld 85  70 - 99 mg/dL   BUN 39 (*) 6 - 23 mg/dL  Creatinine, Ser 6.59 (*) 0.50 - 1.10 mg/dL   Calcium 8.9  8.4 - 10.5 mg/dL   Total Protein 7.1  6.0 - 8.3 g/dL   Albumin 3.3 (*) 3.5 - 5.2 g/dL   AST 14  0 - 37 U/L   ALT 8  0 - 35 U/L   Alkaline Phosphatase 55  39 - 117 U/L   Total Bilirubin <0.2 (*) 0.3 - 1.2 mg/dL   GFR calc non Af Amer 8 (*) >90 mL/min   GFR calc Af Amer 9 (*) >90 mL/min   Comment: (NOTE)     The eGFR has been calculated using the CKD EPI equation.     This calculation has not been validated in all clinical situations.     eGFR's persistently <90 mL/min signify possible Chronic  Kidney     Disease.   Anion gap 19 (*) 5 - 15  CBC     Status: Abnormal   Collection Time    02/03/14  5:36 AM      Result Value Ref Range   WBC 4.1  4.0 - 10.5 K/uL   RBC 3.38 (*) 3.87 - 5.11 MIL/uL   Hemoglobin 10.3 (*) 12.0 - 15.0 g/dL   HCT 32.1 (*) 36.0 - 46.0 %   MCV 95.0  78.0 - 100.0 fL   MCH 30.5  26.0 - 34.0 pg   MCHC 32.1  30.0 - 36.0 g/dL   RDW 14.1  11.5 - 15.5 %   Platelets 205  150 - 400 K/uL    No results found.  Review of Systems  Constitutional: Negative for fever and chills.  Respiratory: Negative for shortness of breath.   Cardiovascular: Negative for orthopnea.  Gastrointestinal: Positive for nausea and abdominal pain. Negative for vomiting.  Genitourinary: Positive for flank pain.   Blood pressure 115/69, pulse 94, temperature 97.9 F (36.6 C), temperature source Oral, resp. rate 18, height 3' (0.914 m), weight 30.754 kg (67 lb 12.8 oz), SpO2 99.00%. Physical Exam  Constitutional: She is oriented to person, place, and time. No distress.  Eyes: No scleral icterus.  Neck: No JVD present.  Cardiovascular: Normal rate and regular rhythm.   No murmur heard. Respiratory: She has no wheezes.  GI: She exhibits no distension. There is no tenderness. There is no rebound.  Musculoskeletal: She exhibits no edema.  Neurological: She is alert and oriented to person, place, and time.    Assessment/Plan: Problem #1 end-stage renal disease: She is status post hemodialysis on Tuesday. Patient missed her  dialysis yesterday. Presently patient doesn't have any uremic sinus symptoms. Problem #2 hypertension: Her blood pressure is reasonably controlled Problem #3 history of UTI/pyelonephritis. Patient presently a febrile and normal white cell count. Patient is feeling much better. Problem #4 anemia: Her hemoglobin and hematocrit is in target range Problem #5 metabolic bone disease: Calcium is range Problem #6 acid-base balance: CO2 his .22 Problem #7 spinal  bifida Plan: We'll make arrangements for patient to get dialysis today We'll use 3K/2.5 calcium bath for 3 hours. We'll DC IV fluid. We'll use Epogen 10,000 units after each dialysis.   Araina Butrick S 02/03/2014, 7:45 AM

## 2014-02-04 DIAGNOSIS — I10 Essential (primary) hypertension: Secondary | ICD-10-CM | POA: Diagnosis not present

## 2014-02-04 DIAGNOSIS — Z992 Dependence on renal dialysis: Secondary | ICD-10-CM | POA: Diagnosis not present

## 2014-02-04 DIAGNOSIS — N390 Urinary tract infection, site not specified: Secondary | ICD-10-CM | POA: Diagnosis not present

## 2014-02-04 DIAGNOSIS — N10 Acute tubulo-interstitial nephritis: Secondary | ICD-10-CM | POA: Diagnosis not present

## 2014-02-04 DIAGNOSIS — N186 End stage renal disease: Secondary | ICD-10-CM | POA: Diagnosis not present

## 2014-02-04 DIAGNOSIS — D631 Anemia in chronic kidney disease: Secondary | ICD-10-CM | POA: Diagnosis not present

## 2014-02-04 LAB — BASIC METABOLIC PANEL
Anion gap: 13 (ref 5–15)
BUN: 14 mg/dL (ref 6–23)
CALCIUM: 9.6 mg/dL (ref 8.4–10.5)
CO2: 27 mEq/L (ref 19–32)
CREATININE: 3.55 mg/dL — AB (ref 0.50–1.10)
Chloride: 101 mEq/L (ref 96–112)
GFR, EST AFRICAN AMERICAN: 19 mL/min — AB (ref >90)
GFR, EST NON AFRICAN AMERICAN: 17 mL/min — AB (ref >90)
Glucose, Bld: 75 mg/dL (ref 70–99)
Potassium: 3.8 mEq/L (ref 3.7–5.3)
Sodium: 141 mEq/L (ref 137–147)

## 2014-02-04 LAB — CBC
HCT: 34.3 % — ABNORMAL LOW (ref 36.0–46.0)
HEMOGLOBIN: 11 g/dL — AB (ref 12.0–15.0)
MCH: 30.6 pg (ref 26.0–34.0)
MCHC: 32.1 g/dL (ref 30.0–36.0)
MCV: 95.5 fL (ref 78.0–100.0)
PLATELETS: 179 10*3/uL (ref 150–400)
RBC: 3.59 MIL/uL — ABNORMAL LOW (ref 3.87–5.11)
RDW: 14.2 % (ref 11.5–15.5)
WBC: 3.6 10*3/uL — ABNORMAL LOW (ref 4.0–10.5)

## 2014-02-04 LAB — PHOSPHORUS: Phosphorus: 4.5 mg/dL (ref 2.3–4.6)

## 2014-02-04 MED ORDER — DIPHENHYDRAMINE HCL 25 MG PO CAPS
25.0000 mg | ORAL_CAPSULE | Freq: Three times a day (TID) | ORAL | Status: DC | PRN
  Administered 2014-02-04: 25 mg via ORAL
  Filled 2014-02-04: qty 1

## 2014-02-04 MED ORDER — CEFUROXIME AXETIL 500 MG PO TABS: 500.0000 mg | ORAL_TABLET | ORAL | Status: DC

## 2014-02-04 MED ORDER — CEFUROXIME AXETIL 250 MG PO TABS
500.0000 mg | ORAL_TABLET | ORAL | Status: DC
  Administered 2014-02-04: 500 mg via ORAL
  Filled 2014-02-04: qty 2

## 2014-02-04 NOTE — Progress Notes (Signed)
Patient is stable at this time. Discharged to home with family. Reviewed hospital discharge instructions, reviewed discharge medication list and instructed on when next doses due. Patient to pick up RX for antibiotics from [facility]. Client returns to dialysis on Monday and then resumes normal Tues, Thurs, Saturday schedule. Understanding voiced to instructions.

## 2014-02-04 NOTE — Progress Notes (Signed)
  PROGRESS NOTE  BETY CANCEL [id number] DOB: 09/09/1990 DOA: 02/02/2014 PCP: No PCP Per Patient Nephrologist only, no PCP, she cannot recall the name.  Summary: 23 year old woman with history of spina bifida, end-stage renal disease on hemodialysis presented with symptoms suggestive of UTI. She was admitted for pyelonephritis and UTI.  Assessment/Plan: 1. UTI, suspected pyelonephritis. Clinically resolved. No fever.  2. End-stage renal disease on hemodialysis. Status post hemodialysis 7/10. 3. Spina bifida.   Much improved. Plan discharge home today on oral antibiotics, discussed dosing with pharmacist Benny, recommendation dosing for Ceftin 500mg  PO q48.  Murray Hodgkins, MD  [facility]  Pager [phone] If 7PM-7AM, please contact night-coverage at [email or web address], password [facility] 02/04/2014, 11:20 AM  LOS: 2 days   Consultants:  Nephrology  Procedures:  Routine hemodialysis  Antibiotics:  Ceftriaxone 7/9 >> 7/10  Ceftin 7/11 >> 7/15  HPI/Subjective: Feels better. No nausea or vomiting. No abdominal pain. Tolerating diet.  Objective: Filed Vitals:   02/03/14 1500 02/03/14 1525 02/03/14 1530 02/03/14 2225  BP: 99/53 100/55 100/53 118/68  Pulse: 88 91 89 91  Temp:   98 F (36.7 C) 98.6 F (37 C)  TempSrc:   Oral Oral  Resp:   16 20  Height:      Weight:   29.1 kg (64 lb 2.5 oz)   SpO2:   100% 100%    Intake/Output Summary (Last 24 hours) at 02/04/14 1120 Last data filed at 02/04/14 0904  Gross per 24 hour  Intake    480 ml  Output    400 ml  Net     80 ml     Filed Weights   02/03/14 0500 02/03/14 1220 02/03/14 1530  Weight: 30.754 kg (67 lb 12.8 oz) 29.121 kg (64 lb 3.2 oz) 29.1 kg (64 lb 2.5 oz)    Exam:     Afebrile, vital signs stable. No hypoxia. Gen. Appears calm and comfortable.  Psych. Alert. Speech fluent and clear.  Cardiovascular. Regular rate and rhythm. No murmur, rub or gallop. No lower extremity edema.  Respiratory.  Clear to auscultation bilaterally. No wheezes, rales or rhonchi. Normal respiratory effort.  Abdomen. Soft.  Data Reviewed: I/O: Urine output 600.  Chemistry: Basic metabolic panel consistent with end-stage renal disease. Potassium normal. Phosphorus normal.  Heme: Hemoglobin stable.  ID: Blood and urine culture pending  Scheduled Meds: . cefTRIAXone (ROCEPHIN)  IV  1 g Intravenous Q24H  . cinacalcet  30 mg Oral Q breakfast  . epoetin alfa  10,000 Units Intravenous Once per day on Mon Wed Fri  . heparin  5,000 Units Subcutaneous 3 times per day  . multivitamin  1 tablet Oral Daily  . sevelamer carbonate  2,400 mg Oral TID WC  . sodium bicarbonate  325 mg Oral TID  . [START ON 02/08/2014] Vitamin D (Ergocalciferol)  50,000 Units Oral Q7 days   Continuous Infusions:   Principal Problem:   Pyelonephritis Active Problems:   ESRD (end stage renal disease) on dialysis   Anemia   HTN (hypertension)   Spina bifida

## 2014-02-04 NOTE — Discharge Summary (Signed)
Physician Discharge Summary  [name] [id number] DOB: 02-28-1991 DOA: 02/02/2014  PCP: No PCP Per Patient Nephrologist only, no PCP, she cannot recall the name.  Admit date: 02/02/2014 Discharge date: 02/04/2014  Recommendations for Outpatient Follow-up:  1. Resolution of UTI, suspected pyelonephritis   Discharge Diagnoses:  1. UTI, suspected pyelonephritis 2. End-stage renal disease on hemodialysis 3. History of spina bifida  Discharge Condition: Improved Disposition: Home  Diet recommendation: Low salt  Filed Weights   02/03/14 0500 02/03/14 1220 02/03/14 1530  Weight: 30.754 kg (67 lb 12.8 oz) 29.121 kg (64 lb 3.2 oz) 29.1 kg (64 lb 2.5 oz)    History of present illness:  23 year old woman with history of spina bifida, end-stage renal disease on hemodialysis presented with symptoms suggestive of UTI. She was admitted for pyelonephritis and UTI.  Hospital Course:  Ms. Levings was treated for UTI and pyelonephritis with empiric Rocephin, rapidly improved and is now stable for discharge. Hospitalization was uncomplicated. Review of previous urine cultures: Citrobacter koseri sensitive to cephalosporins. Plan discharge home on Ceftin. Followup urine culture for final identification sensitivities.  1. UTI, suspected pyelonephritis. Clinically resolved. No fever.  2. End-stage renal disease on hemodialysis. Status post hemodialysis 7/10. 3. Spina bifida.  Consultants:  Nephrology Procedures:  Routine hemodialysis Antibiotics:  Ceftriaxone 7/9 >> 7/10  Ceftin 7/11 >> 7/15  Discharge Instructions  Discharge Instructions   Activity as tolerated - No restrictions    Complete by:  As directed      Diet - low sodium heart healthy    Complete by:  As directed      Discharge instructions    Complete by:  As directed   Be sure to finish antibiotic Ceftin. Call your physician or seek immediate medical attention for recurrent fever, nausea, vomiting, abdominal pain or  worsening of condition.            Medication List         cefUROXime 500 MG tablet  Commonly known as:  CEFTIN  Take 1 tablet (500 mg total) by mouth every other day. Next dose 7/13 at lunch time.     cinacalcet 30 MG tablet  Commonly known as:  SENSIPAR  Take 30 mg by mouth daily.     EPOGEN IJ  Inject as directed. Pt gets on dialysis days which are Tuesday and Saturday     multivitamin Tabs tablet  Take 1 tablet by mouth daily.     sevelamer carbonate 800 MG tablet  Commonly known as:  RENVELA  Take 2,400 mg by mouth 3 (three) times daily with meals.     SODIUM BICARBONATE PO  Take 3 tablets by mouth daily.     Vitamin D (Ergocalciferol) 50000 UNITS Caps capsule  Commonly known as:  DRISDOL  Take 50,000 Units by mouth every 7 (seven) days. Takes on Thursday       Allergies  Allergen Reactions  . Ciprofloxacin Shortness Of Breath, Nausea And Vomiting and Other (See Comments)    HIGH FEVER  . Other Anaphylaxis    Revaclear dialzer  . Peanut-Containing Drug Products Anaphylaxis  . Aleve [Naproxen Sodium] Other (See Comments)    G.I.Bleed  . Influenza Vaccines Nausea And Vomiting    High fever  . Tetanus Toxoids Nausea And Vomiting and Other (See Comments)    HIGH FEVER  . Latex Itching and Rash    The results of significant diagnostics from this hospitalization (including imaging, microbiology, ancillary and laboratory) are listed below for  reference.    Significant Diagnostic Studies: No results found.  Microbiology: Recent Results (from the past 240 hour(s))  URINE CULTURE     Status: None   Collection Time    02/02/14  4:03 PM      Result Value Ref Range Status   Specimen Description URINE, CATHETERIZED   Final   Special Requests NONE   Final   Culture  Setup Time     Final   Value: 02/02/2014 22:53     Performed at [facility] Count     Final   Value: >=100,000 COLONIES/ML     Performed at [facility]   Culture      Final   Value: [place]     Performed at [facility]   Report Status PENDING   Incomplete  CULTURE, BLOOD (ROUTINE X 2)     Status: None   Collection Time    02/02/14  4:04 PM      Result Value Ref Range Status   Specimen Description BLOOD RIGHT FOREARM DRAWN BY RN   Final   Special Requests BOTTLES DRAWN AEROBIC ONLY 6CC   Final   Culture NO GROWTH 2 DAYS   Final   Report Status PENDING   Incomplete  CULTURE, BLOOD (ROUTINE X 2)     Status: None   Collection Time    02/02/14  4:12 PM      Result Value Ref Range Status   Specimen Description BLOOD RIGHT HAND   Final   Special Requests BOTTLES DRAWN AEROBIC AND ANAEROBIC [place]   Final   Culture NO GROWTH 2 DAYS   Final   Report Status PENDING   Incomplete     Labs: Basic Metabolic Panel:  Recent Labs Lab 02/02/14 1604 02/03/14 0536 02/04/14 0617  NA 141 144 141  K 4.1 4.2 3.8  CL 99 103 101  CO2 26 22 27   GLUCOSE 82 85 75  BUN 37* 39* 14  CREATININE 6.42* 6.59* 3.55*  CALCIUM 10.2 8.9 9.6  PHOS  --   --  4.5   Liver Function Tests:  Recent Labs Lab 02/03/14 0536  AST 14  ALT 8  ALKPHOS 55  BILITOT <0.2*  PROT 7.1  ALBUMIN 3.3*   CBC:  Recent Labs Lab 02/02/14 1604 02/03/14 0536 02/04/14 0617  WBC 4.1 4.1 3.6*  NEUTROABS 1.5*  --   --   HGB 11.5* 10.3* 11.0*  HCT 35.6* 32.1* 34.3*  MCV 94.9 95.0 95.5  PLT 219 205 179    Principal Problem:   Pyelonephritis Active Problems:   ESRD (end stage renal disease) on dialysis   Anemia   HTN (hypertension)   Spina bifida   Time coordinating discharge: 25 minutes  Signed:  Murray Hodgkins, MD [facility] 02/04/2014, 12:24 PM

## 2014-02-04 NOTE — Progress Notes (Signed)
Subjective: Interval History: has no complaint of abdominal pain. She does have any nausea no vomiting. Presently she's feeling much better..  Objective: Vital signs in last 24 hours: Temp:  [98 F (36.7 C)-98.6 F (37 C)] 98.6 F (37 C) (07/10 2225) Pulse Rate:  [84-99] 91 (07/10 2225) Resp:  [16-20] 20 (07/10 2225) BP: (99-132)/(53-69) 118/68 mmHg (07/10 2225) SpO2:  [100 %] 100 % (07/10 2225) Weight:  [29.1 kg (64 lb 2.5 oz)-29.121 kg (64 lb 3.2 oz)] 29.1 kg (64 lb 2.5 oz) (07/10 1530) Weight change: 1.905 kg (4 lb 3.2 oz)  Intake/Output from previous day: 07/10 0701 - 07/11 0700 In: 240 [P.O.:240] Out: 600 [Urine:600] Intake/Output this shift: Total I/O In: 240 [P.O.:240] Out: -   General appearance: alert, cooperative and no distress Resp: clear to auscultation bilaterally Cardio: regular rate and rhythm, S1, S2 normal, no murmur, click, rub or gallop GI: soft, non-tender; bowel sounds normal; no masses,  no organomegaly Extremities: extremities normal, atraumatic, no cyanosis or edema  Lab Results:  Recent Labs  02/03/14 0536 02/04/14 0617  WBC 4.1 3.6*  HGB 10.3* 11.0*  HCT 32.1* 34.3*  PLT 205 179   BMET:  Recent Labs  02/03/14 0536 02/04/14 0617  NA 144 141  K 4.2 3.8  CL 103 101  CO2 22 27  GLUCOSE 85 75  BUN 39* 14  CREATININE 6.59* 3.55*  CALCIUM 8.9 9.6   No results found for this basename: PTH,  in the last 72 hours Iron Studies: No results found for this basename: IRON, TIBC, TRANSFERRIN, FERRITIN,  in the last 72 hours  Studies/Results: No results found.  I have reviewed the patient's current medications.  Assessment/Plan: Problem #1 end-stage renal disease: She status post hemodialysis yesterday. Presently patient doesn't have any uremic sinus symptoms. Her potassium is normal. Problem #2 anemia: Her hemoglobin is was in a target goal. Problem #3 hypertension: Her blood pressure is reasonably controlled Problem #4 spina  bifida Problem #5 history of UTI/pyelonephritis: Presently patient is asymptomatic and her white blood cell count is normal.  Problem #6 metabolic bone disease: Her calcium and phosphorus in the range. Plan: We'll continue his present management Patient has this moment does not need dialysis and possibly she may require dialysis on Monday.   LOS: 2 days   Tavyn Kurka S 02/04/2014,9:26 AM

## 2014-02-05 LAB — URINE CULTURE: Colony Count: >=100000

## 2014-02-06 ENCOUNTER — Telehealth: Payer: Self-pay | Admitting: Family Medicine

## 2014-02-06 ENCOUNTER — Emergency Department (HOSPITAL_COMMUNITY)
Admission: EM | Admit: 2014-02-06 | Discharge: 2014-02-06 | Disposition: A | Discharge status: Home / Self Care | Payer: Medicare Other | Attending: Emergency Medicine | Admitting: Emergency Medicine

## 2014-02-06 ENCOUNTER — Encounter (HOSPITAL_COMMUNITY): Payer: Self-pay | Admitting: Emergency Medicine

## 2014-02-06 DIAGNOSIS — R221 Localized swelling, mass and lump, neck: Principal | ICD-10-CM

## 2014-02-06 DIAGNOSIS — T7840XA Allergy, unspecified, initial encounter: Secondary | ICD-10-CM

## 2014-02-06 DIAGNOSIS — B9689 Other specified bacterial agents as the cause of diseases classified elsewhere: Secondary | ICD-10-CM | POA: Diagnosis not present

## 2014-02-06 DIAGNOSIS — N039 Chronic nephritic syndrome with unspecified morphologic changes: Secondary | ICD-10-CM | POA: Diagnosis not present

## 2014-02-06 DIAGNOSIS — I120 Hypertensive chronic kidney disease with stage 5 chronic kidney disease or end stage renal disease: Secondary | ICD-10-CM | POA: Insufficient documentation

## 2014-02-06 DIAGNOSIS — N186 End stage renal disease: Secondary | ICD-10-CM | POA: Diagnosis not present

## 2014-02-06 DIAGNOSIS — Z79899 Other long term (current) drug therapy: Secondary | ICD-10-CM | POA: Insufficient documentation

## 2014-02-06 DIAGNOSIS — Z87440 Personal history of urinary (tract) infections: Secondary | ICD-10-CM | POA: Diagnosis not present

## 2014-02-06 DIAGNOSIS — T50995A Adverse effect of other drugs, medicaments and biological substances, initial encounter: Secondary | ICD-10-CM | POA: Insufficient documentation

## 2014-02-06 DIAGNOSIS — Z992 Dependence on renal dialysis: Secondary | ICD-10-CM | POA: Diagnosis not present

## 2014-02-06 DIAGNOSIS — R220 Localized swelling, mass and lump, head: Secondary | ICD-10-CM | POA: Diagnosis not present

## 2014-02-06 DIAGNOSIS — Z792 Long term (current) use of antibiotics: Secondary | ICD-10-CM | POA: Insufficient documentation

## 2014-02-06 DIAGNOSIS — Q7649 Other congenital malformations of spine, not associated with scoliosis: Secondary | ICD-10-CM | POA: Insufficient documentation

## 2014-02-06 DIAGNOSIS — A499 Bacterial infection, unspecified: Secondary | ICD-10-CM | POA: Diagnosis not present

## 2014-02-06 DIAGNOSIS — D631 Anemia in chronic kidney disease: Secondary | ICD-10-CM | POA: Insufficient documentation

## 2014-02-06 DIAGNOSIS — Q059 Spina bifida, unspecified: Secondary | ICD-10-CM | POA: Insufficient documentation

## 2014-02-06 DIAGNOSIS — Z91040 Latex allergy status: Secondary | ICD-10-CM | POA: Insufficient documentation

## 2014-02-06 NOTE — Telephone Encounter (Signed)
Urine culture results: Pseudomonas aeruginosa with multiple sensitivities including ciprofloxacin, however patient allergic.  Discussed with pharmacy, recommendations for cefepime or ceftazidime.  Discussed with the patient's nephrologist Erling Cruz, MD the above, he will plan for ceftazidime with hemodialysis and will make the necessary arrangements.  I have been unable to recent patient on her cell phone or Aunt's.  Murray Hodgkins, MD [facility] [phone]

## 2014-02-06 NOTE — ED Provider Notes (Signed)
CSN: [id number]     Arrival date & time 02/06/14  1529 History   First MD Initiated Contact with Patient 02/06/14 1605     Chief Complaint  Patient presents with  . Allergic Reaction     (Consider location/radiation/quality/duration/timing/severity/associated sxs/prior Treatment) HPI 23 year old female with Pseudomonas urinary tract infection discharge from hospital 2 days ago and prior to discharge she was given an oral dose of cefuroxime which she states caused burning sensation and swelling of her lips and upper mouth which is now better but still has tenderness to her upper lip and hard palate; there is no tongue swelling no change in speech no shortness of breath no rash no itching no new swelling in her urinary tract infection will be treated tomorrow at dialysis with ceftazidime according to the hospitalist note and the patient stopped her cefuroxime after that first dose and did not take any yesterday or today. There is no fever no chest pain no shortness breath no abdominal pain no other concerns. There is no treatment prior to arrival. Past Medical History  Diagnosis Date  . Spina bifida   . UTI (lower urinary tract infection)   . HTN (hypertension) 05/02/2011  . Anemia associated with chronic renal failure   . 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  . Blood transfusion   . Dialysis care   . ESRD (end stage renal disease) on dialysis    Past Surgical History  Procedure Laterality Date  . Av fistula placement      left arm   Family History  Problem Relation Age of Onset  . Kidney cancer Other    History  Substance Use Topics  . Smoking status: Never Smoker   . Smokeless tobacco: Not on file  . Alcohol Use: No   OB History   Grav Para Term Preterm Abortions TAB SAB Ect Mult Living                 Review of Systems 10 Systems reviewed and are negative for acute change except as noted in the HPI.   Allergies  Ciprofloxacin; Other;  Peanut-containing drug products; Aleve; Influenza vaccines; Tetanus toxoids; and Latex  Home Medications   Prior to Admission medications   Medication Sig Start Date End Date Taking? Authorizing Provider  cefUROXime (CEFTIN) 500 MG tablet Take 1 tablet (500 mg total) by mouth every other day. Next dose 7/13 at lunch time. 02/04/14   Samuella Cota, MD  cinacalcet (SENSIPAR) 30 MG tablet Take 30 mg by mouth daily.    Historical Provider, MD  Epoetin Alfa (EPOGEN IJ) Inject as directed. Pt gets on dialysis days which are Tuesday and Saturday    Historical Provider, MD  multivitamin (RENA-VIT) TABS tablet Take 1 tablet by mouth daily.    Historical Provider, MD  sevelamer carbonate (RENVELA) 800 MG tablet Take 2,400 mg by mouth 3 (three) times daily with meals.    Historical Provider, MD  SODIUM BICARBONATE PO Take 3 tablets by mouth daily.    Historical Provider, MD  Vitamin D, Ergocalciferol, (DRISDOL) 50000 UNITS CAPS capsule Take 50,000 Units by mouth every 7 (seven) days. Takes on Thursday    Historical Provider, MD   BP 126/75  Pulse 99  Temp(Src) 97.9 F (36.6 C)  Resp 18  Wt 65 lb (29.484 kg)  SpO2 98% Physical Exam  Nursing note and vitals reviewed. Constitutional:  Awake, alert, nontoxic appearance.  HENT:  Head: Atraumatic.  Eyes: Right eye  exhibits no discharge. Left eye exhibits no discharge.  Neck: Neck supple.  Cardiovascular: Normal rate and regular rhythm.   No murmur heard. Pulmonary/Chest: Effort normal and breath sounds normal. No respiratory distress. She has no wheezes. She has no rales. She exhibits no tenderness.  Abdominal: Soft. Bowel sounds are normal. She exhibits no distension. There is no tenderness. There is no rebound and no guarding.  No CVA tenderness  Musculoskeletal: She exhibits no tenderness.  Baseline ROM, no obvious new focal weakness. Good thrill left arm fistula. Baseline spina bifida body habitus.  Neurological: She is alert.  Mental  status and motor strength appears baseline for patient and situation.  Skin: No rash noted.  Psychiatric: She has a normal mood and affect.    ED Course  Procedures (including critical care time) Patient informed of clinical course, understand medical decision-making process, and agree with plan. Labs Review Labs Reviewed - No data to display  Imaging Review No results found.   EKG Interpretation None      MDM   Final diagnoses:  Allergic reaction, initial encounter    I doubt any other EMC precluding discharge at this time including, but not necessarily limited to the following:sepsis, anaphylaxis.    Babette Relic, MD 02/10/14 [phone]

## 2014-02-06 NOTE — ED Notes (Signed)
Pt report taking cefuroxime for UTI and started with oral swelling and throat pain. Handling secretions without difficulty. Maintain SATS on ra.

## 2014-02-06 NOTE — Discharge Instructions (Signed)
Discontinue cefuroxime as you have done.

## 2014-02-07 LAB — CULTURE, BLOOD (ROUTINE X 2)
Culture: NO GROWTH
Culture: NO GROWTH

## 2014-02-24 DIAGNOSIS — N186 End stage renal disease: Secondary | ICD-10-CM | POA: Diagnosis not present

## 2014-02-25 DIAGNOSIS — N2581 Secondary hyperparathyroidism of renal origin: Secondary | ICD-10-CM | POA: Diagnosis not present

## 2014-02-25 DIAGNOSIS — N186 End stage renal disease: Secondary | ICD-10-CM | POA: Diagnosis not present

## 2014-02-25 DIAGNOSIS — D631 Anemia in chronic kidney disease: Secondary | ICD-10-CM | POA: Diagnosis not present

## 2014-02-28 DIAGNOSIS — D631 Anemia in chronic kidney disease: Secondary | ICD-10-CM | POA: Diagnosis not present

## 2014-02-28 DIAGNOSIS — N186 End stage renal disease: Secondary | ICD-10-CM | POA: Diagnosis not present

## 2014-02-28 DIAGNOSIS — N2581 Secondary hyperparathyroidism of renal origin: Secondary | ICD-10-CM | POA: Diagnosis not present

## 2014-03-02 DIAGNOSIS — N186 End stage renal disease: Secondary | ICD-10-CM | POA: Diagnosis not present

## 2014-03-02 DIAGNOSIS — N2581 Secondary hyperparathyroidism of renal origin: Secondary | ICD-10-CM | POA: Diagnosis not present

## 2014-03-02 DIAGNOSIS — D631 Anemia in chronic kidney disease: Secondary | ICD-10-CM | POA: Diagnosis not present

## 2014-03-04 DIAGNOSIS — N186 End stage renal disease: Secondary | ICD-10-CM | POA: Diagnosis not present

## 2014-03-04 DIAGNOSIS — N2581 Secondary hyperparathyroidism of renal origin: Secondary | ICD-10-CM | POA: Diagnosis not present

## 2014-03-04 DIAGNOSIS — D631 Anemia in chronic kidney disease: Secondary | ICD-10-CM | POA: Diagnosis not present

## 2014-03-07 DIAGNOSIS — N2581 Secondary hyperparathyroidism of renal origin: Secondary | ICD-10-CM | POA: Diagnosis not present

## 2014-03-07 DIAGNOSIS — D631 Anemia in chronic kidney disease: Secondary | ICD-10-CM | POA: Diagnosis not present

## 2014-03-07 DIAGNOSIS — N186 End stage renal disease: Secondary | ICD-10-CM | POA: Diagnosis not present

## 2014-03-07 DIAGNOSIS — N039 Chronic nephritic syndrome with unspecified morphologic changes: Secondary | ICD-10-CM | POA: Diagnosis not present

## 2014-03-09 DIAGNOSIS — D631 Anemia in chronic kidney disease: Secondary | ICD-10-CM | POA: Diagnosis not present

## 2014-03-09 DIAGNOSIS — N186 End stage renal disease: Secondary | ICD-10-CM | POA: Diagnosis not present

## 2014-03-09 DIAGNOSIS — N2581 Secondary hyperparathyroidism of renal origin: Secondary | ICD-10-CM | POA: Diagnosis not present

## 2014-03-11 DIAGNOSIS — D631 Anemia in chronic kidney disease: Secondary | ICD-10-CM | POA: Diagnosis not present

## 2014-03-11 DIAGNOSIS — N039 Chronic nephritic syndrome with unspecified morphologic changes: Secondary | ICD-10-CM | POA: Diagnosis not present

## 2014-03-11 DIAGNOSIS — N2581 Secondary hyperparathyroidism of renal origin: Secondary | ICD-10-CM | POA: Diagnosis not present

## 2014-03-11 DIAGNOSIS — N186 End stage renal disease: Secondary | ICD-10-CM | POA: Diagnosis not present

## 2014-03-14 DIAGNOSIS — D631 Anemia in chronic kidney disease: Secondary | ICD-10-CM | POA: Diagnosis not present

## 2014-03-14 DIAGNOSIS — N2581 Secondary hyperparathyroidism of renal origin: Secondary | ICD-10-CM | POA: Diagnosis not present

## 2014-03-14 DIAGNOSIS — N186 End stage renal disease: Secondary | ICD-10-CM | POA: Diagnosis not present

## 2014-03-16 DIAGNOSIS — N186 End stage renal disease: Secondary | ICD-10-CM | POA: Diagnosis not present

## 2014-03-16 DIAGNOSIS — D631 Anemia in chronic kidney disease: Secondary | ICD-10-CM | POA: Diagnosis not present

## 2014-03-16 DIAGNOSIS — N2581 Secondary hyperparathyroidism of renal origin: Secondary | ICD-10-CM | POA: Diagnosis not present

## 2014-03-18 DIAGNOSIS — N186 End stage renal disease: Secondary | ICD-10-CM | POA: Diagnosis not present

## 2014-03-18 DIAGNOSIS — D631 Anemia in chronic kidney disease: Secondary | ICD-10-CM | POA: Diagnosis not present

## 2014-03-18 DIAGNOSIS — N2581 Secondary hyperparathyroidism of renal origin: Secondary | ICD-10-CM | POA: Diagnosis not present

## 2014-03-21 DIAGNOSIS — N2581 Secondary hyperparathyroidism of renal origin: Secondary | ICD-10-CM | POA: Diagnosis not present

## 2014-03-21 DIAGNOSIS — D631 Anemia in chronic kidney disease: Secondary | ICD-10-CM | POA: Diagnosis not present

## 2014-03-21 DIAGNOSIS — N186 End stage renal disease: Secondary | ICD-10-CM | POA: Diagnosis not present

## 2014-03-23 DIAGNOSIS — D631 Anemia in chronic kidney disease: Secondary | ICD-10-CM | POA: Diagnosis not present

## 2014-03-23 DIAGNOSIS — N186 End stage renal disease: Secondary | ICD-10-CM | POA: Diagnosis not present

## 2014-03-23 DIAGNOSIS — N2581 Secondary hyperparathyroidism of renal origin: Secondary | ICD-10-CM | POA: Diagnosis not present

## 2014-03-27 DIAGNOSIS — N186 End stage renal disease: Secondary | ICD-10-CM | POA: Diagnosis not present

## 2014-03-28 DIAGNOSIS — R3989 Other symptoms and signs involving the genitourinary system: Secondary | ICD-10-CM | POA: Diagnosis not present

## 2014-03-28 DIAGNOSIS — N039 Chronic nephritic syndrome with unspecified morphologic changes: Secondary | ICD-10-CM | POA: Diagnosis not present

## 2014-03-28 DIAGNOSIS — N186 End stage renal disease: Secondary | ICD-10-CM | POA: Diagnosis not present

## 2014-03-28 DIAGNOSIS — N2581 Secondary hyperparathyroidism of renal origin: Secondary | ICD-10-CM | POA: Diagnosis not present

## 2014-03-28 DIAGNOSIS — D631 Anemia in chronic kidney disease: Secondary | ICD-10-CM | POA: Diagnosis not present

## 2014-04-26 DIAGNOSIS — N186 End stage renal disease: Secondary | ICD-10-CM | POA: Diagnosis not present

## 2014-04-27 DIAGNOSIS — N2581 Secondary hyperparathyroidism of renal origin: Secondary | ICD-10-CM | POA: Diagnosis not present

## 2014-04-27 DIAGNOSIS — N186 End stage renal disease: Secondary | ICD-10-CM | POA: Diagnosis not present

## 2014-04-27 DIAGNOSIS — D631 Anemia in chronic kidney disease: Secondary | ICD-10-CM | POA: Diagnosis not present

## 2014-05-02 DIAGNOSIS — N186 End stage renal disease: Secondary | ICD-10-CM | POA: Diagnosis not present

## 2014-05-02 DIAGNOSIS — D631 Anemia in chronic kidney disease: Secondary | ICD-10-CM | POA: Diagnosis not present

## 2014-05-02 DIAGNOSIS — N2581 Secondary hyperparathyroidism of renal origin: Secondary | ICD-10-CM | POA: Diagnosis not present

## 2014-05-04 DIAGNOSIS — N2581 Secondary hyperparathyroidism of renal origin: Secondary | ICD-10-CM | POA: Diagnosis not present

## 2014-05-04 DIAGNOSIS — N186 End stage renal disease: Secondary | ICD-10-CM | POA: Diagnosis not present

## 2014-05-04 DIAGNOSIS — D631 Anemia in chronic kidney disease: Secondary | ICD-10-CM | POA: Diagnosis not present

## 2014-05-09 DIAGNOSIS — N186 End stage renal disease: Secondary | ICD-10-CM | POA: Diagnosis not present

## 2014-05-09 DIAGNOSIS — N2581 Secondary hyperparathyroidism of renal origin: Secondary | ICD-10-CM | POA: Diagnosis not present

## 2014-05-09 DIAGNOSIS — D631 Anemia in chronic kidney disease: Secondary | ICD-10-CM | POA: Diagnosis not present

## 2014-05-11 DIAGNOSIS — D631 Anemia in chronic kidney disease: Secondary | ICD-10-CM | POA: Diagnosis not present

## 2014-05-11 DIAGNOSIS — N2581 Secondary hyperparathyroidism of renal origin: Secondary | ICD-10-CM | POA: Diagnosis not present

## 2014-05-11 DIAGNOSIS — N186 End stage renal disease: Secondary | ICD-10-CM | POA: Diagnosis not present

## 2014-05-13 DIAGNOSIS — D631 Anemia in chronic kidney disease: Secondary | ICD-10-CM | POA: Diagnosis not present

## 2014-05-13 DIAGNOSIS — N2581 Secondary hyperparathyroidism of renal origin: Secondary | ICD-10-CM | POA: Diagnosis not present

## 2014-05-13 DIAGNOSIS — N186 End stage renal disease: Secondary | ICD-10-CM | POA: Diagnosis not present

## 2014-05-16 DIAGNOSIS — D631 Anemia in chronic kidney disease: Secondary | ICD-10-CM | POA: Diagnosis not present

## 2014-05-16 DIAGNOSIS — N2581 Secondary hyperparathyroidism of renal origin: Secondary | ICD-10-CM | POA: Diagnosis not present

## 2014-05-16 DIAGNOSIS — N186 End stage renal disease: Secondary | ICD-10-CM | POA: Diagnosis not present

## 2014-05-18 DIAGNOSIS — N186 End stage renal disease: Secondary | ICD-10-CM | POA: Diagnosis not present

## 2014-05-18 DIAGNOSIS — N2581 Secondary hyperparathyroidism of renal origin: Secondary | ICD-10-CM | POA: Diagnosis not present

## 2014-05-18 DIAGNOSIS — D631 Anemia in chronic kidney disease: Secondary | ICD-10-CM | POA: Diagnosis not present

## 2014-05-20 DIAGNOSIS — N186 End stage renal disease: Secondary | ICD-10-CM | POA: Diagnosis not present

## 2014-05-20 DIAGNOSIS — N2581 Secondary hyperparathyroidism of renal origin: Secondary | ICD-10-CM | POA: Diagnosis not present

## 2014-05-20 DIAGNOSIS — D631 Anemia in chronic kidney disease: Secondary | ICD-10-CM | POA: Diagnosis not present

## 2014-05-23 DIAGNOSIS — N186 End stage renal disease: Secondary | ICD-10-CM | POA: Diagnosis not present

## 2014-05-23 DIAGNOSIS — D631 Anemia in chronic kidney disease: Secondary | ICD-10-CM | POA: Diagnosis not present

## 2014-05-23 DIAGNOSIS — N2581 Secondary hyperparathyroidism of renal origin: Secondary | ICD-10-CM | POA: Diagnosis not present

## 2014-05-25 DIAGNOSIS — N2581 Secondary hyperparathyroidism of renal origin: Secondary | ICD-10-CM | POA: Diagnosis not present

## 2014-05-25 DIAGNOSIS — N186 End stage renal disease: Secondary | ICD-10-CM | POA: Diagnosis not present

## 2014-05-25 DIAGNOSIS — D631 Anemia in chronic kidney disease: Secondary | ICD-10-CM | POA: Diagnosis not present

## 2014-05-27 DIAGNOSIS — Z992 Dependence on renal dialysis: Secondary | ICD-10-CM | POA: Diagnosis not present

## 2014-05-27 DIAGNOSIS — N2581 Secondary hyperparathyroidism of renal origin: Secondary | ICD-10-CM | POA: Diagnosis not present

## 2014-05-27 DIAGNOSIS — D631 Anemia in chronic kidney disease: Secondary | ICD-10-CM | POA: Diagnosis not present

## 2014-05-27 DIAGNOSIS — N186 End stage renal disease: Secondary | ICD-10-CM | POA: Diagnosis not present

## 2014-05-30 DIAGNOSIS — N186 End stage renal disease: Secondary | ICD-10-CM | POA: Diagnosis not present

## 2014-05-30 DIAGNOSIS — N2581 Secondary hyperparathyroidism of renal origin: Secondary | ICD-10-CM | POA: Diagnosis not present

## 2014-05-30 DIAGNOSIS — R300 Dysuria: Secondary | ICD-10-CM | POA: Diagnosis not present

## 2014-05-30 DIAGNOSIS — D631 Anemia in chronic kidney disease: Secondary | ICD-10-CM | POA: Diagnosis not present

## 2014-06-26 DIAGNOSIS — N186 End stage renal disease: Secondary | ICD-10-CM | POA: Diagnosis not present

## 2014-06-26 DIAGNOSIS — Z992 Dependence on renal dialysis: Secondary | ICD-10-CM | POA: Diagnosis not present

## 2014-06-27 DIAGNOSIS — D631 Anemia in chronic kidney disease: Secondary | ICD-10-CM | POA: Diagnosis not present

## 2014-06-27 DIAGNOSIS — N2581 Secondary hyperparathyroidism of renal origin: Secondary | ICD-10-CM | POA: Diagnosis not present

## 2014-06-27 DIAGNOSIS — N186 End stage renal disease: Secondary | ICD-10-CM | POA: Diagnosis not present

## 2014-06-29 DIAGNOSIS — N2581 Secondary hyperparathyroidism of renal origin: Secondary | ICD-10-CM | POA: Diagnosis not present

## 2014-06-29 DIAGNOSIS — D631 Anemia in chronic kidney disease: Secondary | ICD-10-CM | POA: Diagnosis not present

## 2014-06-29 DIAGNOSIS — N186 End stage renal disease: Secondary | ICD-10-CM | POA: Diagnosis not present

## 2014-07-04 DIAGNOSIS — N2581 Secondary hyperparathyroidism of renal origin: Secondary | ICD-10-CM | POA: Diagnosis not present

## 2014-07-04 DIAGNOSIS — D631 Anemia in chronic kidney disease: Secondary | ICD-10-CM | POA: Diagnosis not present

## 2014-07-04 DIAGNOSIS — N186 End stage renal disease: Secondary | ICD-10-CM | POA: Diagnosis not present

## 2014-07-06 DIAGNOSIS — N2581 Secondary hyperparathyroidism of renal origin: Secondary | ICD-10-CM | POA: Diagnosis not present

## 2014-07-06 DIAGNOSIS — N186 End stage renal disease: Secondary | ICD-10-CM | POA: Diagnosis not present

## 2014-07-06 DIAGNOSIS — D631 Anemia in chronic kidney disease: Secondary | ICD-10-CM | POA: Diagnosis not present

## 2014-07-08 DIAGNOSIS — N186 End stage renal disease: Secondary | ICD-10-CM | POA: Diagnosis not present

## 2014-07-08 DIAGNOSIS — N2581 Secondary hyperparathyroidism of renal origin: Secondary | ICD-10-CM | POA: Diagnosis not present

## 2014-07-08 DIAGNOSIS — D631 Anemia in chronic kidney disease: Secondary | ICD-10-CM | POA: Diagnosis not present

## 2014-07-11 DIAGNOSIS — N186 End stage renal disease: Secondary | ICD-10-CM | POA: Diagnosis not present

## 2014-07-11 DIAGNOSIS — D631 Anemia in chronic kidney disease: Secondary | ICD-10-CM | POA: Diagnosis not present

## 2014-07-11 DIAGNOSIS — N2581 Secondary hyperparathyroidism of renal origin: Secondary | ICD-10-CM | POA: Diagnosis not present

## 2014-07-13 DIAGNOSIS — D631 Anemia in chronic kidney disease: Secondary | ICD-10-CM | POA: Diagnosis not present

## 2014-07-13 DIAGNOSIS — N2581 Secondary hyperparathyroidism of renal origin: Secondary | ICD-10-CM | POA: Diagnosis not present

## 2014-07-13 DIAGNOSIS — N186 End stage renal disease: Secondary | ICD-10-CM | POA: Diagnosis not present

## 2014-07-15 DIAGNOSIS — N2581 Secondary hyperparathyroidism of renal origin: Secondary | ICD-10-CM | POA: Diagnosis not present

## 2014-07-15 DIAGNOSIS — N186 End stage renal disease: Secondary | ICD-10-CM | POA: Diagnosis not present

## 2014-07-15 DIAGNOSIS — D631 Anemia in chronic kidney disease: Secondary | ICD-10-CM | POA: Diagnosis not present

## 2014-07-18 DIAGNOSIS — D631 Anemia in chronic kidney disease: Secondary | ICD-10-CM | POA: Diagnosis not present

## 2014-07-18 DIAGNOSIS — N186 End stage renal disease: Secondary | ICD-10-CM | POA: Diagnosis not present

## 2014-07-18 DIAGNOSIS — N2581 Secondary hyperparathyroidism of renal origin: Secondary | ICD-10-CM | POA: Diagnosis not present

## 2014-07-23 DIAGNOSIS — N2581 Secondary hyperparathyroidism of renal origin: Secondary | ICD-10-CM | POA: Diagnosis not present

## 2014-07-23 DIAGNOSIS — N186 End stage renal disease: Secondary | ICD-10-CM | POA: Diagnosis not present

## 2014-07-23 DIAGNOSIS — D631 Anemia in chronic kidney disease: Secondary | ICD-10-CM | POA: Diagnosis not present

## 2014-07-25 DIAGNOSIS — N186 End stage renal disease: Secondary | ICD-10-CM | POA: Diagnosis not present

## 2014-07-25 DIAGNOSIS — D631 Anemia in chronic kidney disease: Secondary | ICD-10-CM | POA: Diagnosis not present

## 2014-07-25 DIAGNOSIS — N2581 Secondary hyperparathyroidism of renal origin: Secondary | ICD-10-CM | POA: Diagnosis not present

## 2014-07-27 DIAGNOSIS — Z992 Dependence on renal dialysis: Secondary | ICD-10-CM | POA: Diagnosis not present

## 2014-07-27 DIAGNOSIS — N2581 Secondary hyperparathyroidism of renal origin: Secondary | ICD-10-CM | POA: Diagnosis not present

## 2014-07-27 DIAGNOSIS — N186 End stage renal disease: Secondary | ICD-10-CM | POA: Diagnosis not present

## 2014-07-27 DIAGNOSIS — D631 Anemia in chronic kidney disease: Secondary | ICD-10-CM | POA: Diagnosis not present

## 2014-08-01 DIAGNOSIS — D631 Anemia in chronic kidney disease: Secondary | ICD-10-CM | POA: Diagnosis not present

## 2014-08-01 DIAGNOSIS — N2581 Secondary hyperparathyroidism of renal origin: Secondary | ICD-10-CM | POA: Diagnosis not present

## 2014-08-01 DIAGNOSIS — N186 End stage renal disease: Secondary | ICD-10-CM | POA: Diagnosis not present

## 2014-08-03 DIAGNOSIS — N2581 Secondary hyperparathyroidism of renal origin: Secondary | ICD-10-CM | POA: Diagnosis not present

## 2014-08-03 DIAGNOSIS — N186 End stage renal disease: Secondary | ICD-10-CM | POA: Diagnosis not present

## 2014-08-03 DIAGNOSIS — D631 Anemia in chronic kidney disease: Secondary | ICD-10-CM | POA: Diagnosis not present

## 2014-08-08 DIAGNOSIS — N186 End stage renal disease: Secondary | ICD-10-CM | POA: Diagnosis not present

## 2014-08-08 DIAGNOSIS — N2581 Secondary hyperparathyroidism of renal origin: Secondary | ICD-10-CM | POA: Diagnosis not present

## 2014-08-08 DIAGNOSIS — D631 Anemia in chronic kidney disease: Secondary | ICD-10-CM | POA: Diagnosis not present

## 2014-08-10 DIAGNOSIS — N186 End stage renal disease: Secondary | ICD-10-CM | POA: Diagnosis not present

## 2014-08-10 DIAGNOSIS — D631 Anemia in chronic kidney disease: Secondary | ICD-10-CM | POA: Diagnosis not present

## 2014-08-10 DIAGNOSIS — N2581 Secondary hyperparathyroidism of renal origin: Secondary | ICD-10-CM | POA: Diagnosis not present

## 2014-08-12 DIAGNOSIS — N2581 Secondary hyperparathyroidism of renal origin: Secondary | ICD-10-CM | POA: Diagnosis not present

## 2014-08-12 DIAGNOSIS — N186 End stage renal disease: Secondary | ICD-10-CM | POA: Diagnosis not present

## 2014-08-12 DIAGNOSIS — D631 Anemia in chronic kidney disease: Secondary | ICD-10-CM | POA: Diagnosis not present

## 2014-08-15 DIAGNOSIS — N2581 Secondary hyperparathyroidism of renal origin: Secondary | ICD-10-CM | POA: Diagnosis not present

## 2014-08-15 DIAGNOSIS — N186 End stage renal disease: Secondary | ICD-10-CM | POA: Diagnosis not present

## 2014-08-15 DIAGNOSIS — D631 Anemia in chronic kidney disease: Secondary | ICD-10-CM | POA: Diagnosis not present

## 2014-08-17 DIAGNOSIS — N2581 Secondary hyperparathyroidism of renal origin: Secondary | ICD-10-CM | POA: Diagnosis not present

## 2014-08-17 DIAGNOSIS — N186 End stage renal disease: Secondary | ICD-10-CM | POA: Diagnosis not present

## 2014-08-17 DIAGNOSIS — D631 Anemia in chronic kidney disease: Secondary | ICD-10-CM | POA: Diagnosis not present

## 2014-08-22 DIAGNOSIS — N186 End stage renal disease: Secondary | ICD-10-CM | POA: Diagnosis not present

## 2014-08-22 DIAGNOSIS — N2581 Secondary hyperparathyroidism of renal origin: Secondary | ICD-10-CM | POA: Diagnosis not present

## 2014-08-22 DIAGNOSIS — D631 Anemia in chronic kidney disease: Secondary | ICD-10-CM | POA: Diagnosis not present

## 2014-08-24 DIAGNOSIS — D631 Anemia in chronic kidney disease: Secondary | ICD-10-CM | POA: Diagnosis not present

## 2014-08-24 DIAGNOSIS — N186 End stage renal disease: Secondary | ICD-10-CM | POA: Diagnosis not present

## 2014-08-24 DIAGNOSIS — N2581 Secondary hyperparathyroidism of renal origin: Secondary | ICD-10-CM | POA: Diagnosis not present

## 2014-08-26 DIAGNOSIS — D631 Anemia in chronic kidney disease: Secondary | ICD-10-CM | POA: Diagnosis not present

## 2014-08-26 DIAGNOSIS — N186 End stage renal disease: Secondary | ICD-10-CM | POA: Diagnosis not present

## 2014-08-26 DIAGNOSIS — N2581 Secondary hyperparathyroidism of renal origin: Secondary | ICD-10-CM | POA: Diagnosis not present

## 2014-08-27 DIAGNOSIS — Z992 Dependence on renal dialysis: Secondary | ICD-10-CM | POA: Diagnosis not present

## 2014-08-27 DIAGNOSIS — N186 End stage renal disease: Secondary | ICD-10-CM | POA: Diagnosis not present

## 2014-08-29 DIAGNOSIS — D631 Anemia in chronic kidney disease: Secondary | ICD-10-CM | POA: Diagnosis not present

## 2014-08-29 DIAGNOSIS — N2581 Secondary hyperparathyroidism of renal origin: Secondary | ICD-10-CM | POA: Diagnosis not present

## 2014-08-29 DIAGNOSIS — B999 Unspecified infectious disease: Secondary | ICD-10-CM | POA: Diagnosis not present

## 2014-08-29 DIAGNOSIS — N186 End stage renal disease: Secondary | ICD-10-CM | POA: Diagnosis not present

## 2014-08-31 DIAGNOSIS — N186 End stage renal disease: Secondary | ICD-10-CM | POA: Diagnosis not present

## 2014-08-31 DIAGNOSIS — B999 Unspecified infectious disease: Secondary | ICD-10-CM | POA: Diagnosis not present

## 2014-08-31 DIAGNOSIS — D631 Anemia in chronic kidney disease: Secondary | ICD-10-CM | POA: Diagnosis not present

## 2014-08-31 DIAGNOSIS — N2581 Secondary hyperparathyroidism of renal origin: Secondary | ICD-10-CM | POA: Diagnosis not present

## 2014-09-02 DIAGNOSIS — N2581 Secondary hyperparathyroidism of renal origin: Secondary | ICD-10-CM | POA: Diagnosis not present

## 2014-09-02 DIAGNOSIS — B999 Unspecified infectious disease: Secondary | ICD-10-CM | POA: Diagnosis not present

## 2014-09-02 DIAGNOSIS — D631 Anemia in chronic kidney disease: Secondary | ICD-10-CM | POA: Diagnosis not present

## 2014-09-02 DIAGNOSIS — N186 End stage renal disease: Secondary | ICD-10-CM | POA: Diagnosis not present

## 2014-09-05 DIAGNOSIS — B999 Unspecified infectious disease: Secondary | ICD-10-CM | POA: Diagnosis not present

## 2014-09-05 DIAGNOSIS — N186 End stage renal disease: Secondary | ICD-10-CM | POA: Diagnosis not present

## 2014-09-05 DIAGNOSIS — N2581 Secondary hyperparathyroidism of renal origin: Secondary | ICD-10-CM | POA: Diagnosis not present

## 2014-09-05 DIAGNOSIS — D631 Anemia in chronic kidney disease: Secondary | ICD-10-CM | POA: Diagnosis not present

## 2014-09-07 DIAGNOSIS — N186 End stage renal disease: Secondary | ICD-10-CM | POA: Diagnosis not present

## 2014-09-07 DIAGNOSIS — D631 Anemia in chronic kidney disease: Secondary | ICD-10-CM | POA: Diagnosis not present

## 2014-09-07 DIAGNOSIS — B999 Unspecified infectious disease: Secondary | ICD-10-CM | POA: Diagnosis not present

## 2014-09-07 DIAGNOSIS — N2581 Secondary hyperparathyroidism of renal origin: Secondary | ICD-10-CM | POA: Diagnosis not present

## 2014-09-09 DIAGNOSIS — N2581 Secondary hyperparathyroidism of renal origin: Secondary | ICD-10-CM | POA: Diagnosis not present

## 2014-09-09 DIAGNOSIS — D631 Anemia in chronic kidney disease: Secondary | ICD-10-CM | POA: Diagnosis not present

## 2014-09-09 DIAGNOSIS — N186 End stage renal disease: Secondary | ICD-10-CM | POA: Diagnosis not present

## 2014-09-09 DIAGNOSIS — B999 Unspecified infectious disease: Secondary | ICD-10-CM | POA: Diagnosis not present

## 2014-09-12 DIAGNOSIS — N186 End stage renal disease: Secondary | ICD-10-CM | POA: Diagnosis not present

## 2014-09-12 DIAGNOSIS — D631 Anemia in chronic kidney disease: Secondary | ICD-10-CM | POA: Diagnosis not present

## 2014-09-12 DIAGNOSIS — B999 Unspecified infectious disease: Secondary | ICD-10-CM | POA: Diagnosis not present

## 2014-09-12 DIAGNOSIS — N2581 Secondary hyperparathyroidism of renal origin: Secondary | ICD-10-CM | POA: Diagnosis not present

## 2014-09-14 DIAGNOSIS — D631 Anemia in chronic kidney disease: Secondary | ICD-10-CM | POA: Diagnosis not present

## 2014-09-14 DIAGNOSIS — N186 End stage renal disease: Secondary | ICD-10-CM | POA: Diagnosis not present

## 2014-09-14 DIAGNOSIS — B999 Unspecified infectious disease: Secondary | ICD-10-CM | POA: Diagnosis not present

## 2014-09-14 DIAGNOSIS — N2581 Secondary hyperparathyroidism of renal origin: Secondary | ICD-10-CM | POA: Diagnosis not present

## 2014-09-16 DIAGNOSIS — N186 End stage renal disease: Secondary | ICD-10-CM | POA: Diagnosis not present

## 2014-09-16 DIAGNOSIS — N2581 Secondary hyperparathyroidism of renal origin: Secondary | ICD-10-CM | POA: Diagnosis not present

## 2014-09-16 DIAGNOSIS — B999 Unspecified infectious disease: Secondary | ICD-10-CM | POA: Diagnosis not present

## 2014-09-16 DIAGNOSIS — D631 Anemia in chronic kidney disease: Secondary | ICD-10-CM | POA: Diagnosis not present

## 2014-09-19 DIAGNOSIS — D631 Anemia in chronic kidney disease: Secondary | ICD-10-CM | POA: Diagnosis not present

## 2014-09-19 DIAGNOSIS — N186 End stage renal disease: Secondary | ICD-10-CM | POA: Diagnosis not present

## 2014-09-19 DIAGNOSIS — B999 Unspecified infectious disease: Secondary | ICD-10-CM | POA: Diagnosis not present

## 2014-09-19 DIAGNOSIS — N2581 Secondary hyperparathyroidism of renal origin: Secondary | ICD-10-CM | POA: Diagnosis not present

## 2014-09-21 DIAGNOSIS — B999 Unspecified infectious disease: Secondary | ICD-10-CM | POA: Diagnosis not present

## 2014-09-21 DIAGNOSIS — D631 Anemia in chronic kidney disease: Secondary | ICD-10-CM | POA: Diagnosis not present

## 2014-09-21 DIAGNOSIS — N2581 Secondary hyperparathyroidism of renal origin: Secondary | ICD-10-CM | POA: Diagnosis not present

## 2014-09-21 DIAGNOSIS — N186 End stage renal disease: Secondary | ICD-10-CM | POA: Diagnosis not present

## 2014-09-23 DIAGNOSIS — B999 Unspecified infectious disease: Secondary | ICD-10-CM | POA: Diagnosis not present

## 2014-09-23 DIAGNOSIS — N2581 Secondary hyperparathyroidism of renal origin: Secondary | ICD-10-CM | POA: Diagnosis not present

## 2014-09-23 DIAGNOSIS — N186 End stage renal disease: Secondary | ICD-10-CM | POA: Diagnosis not present

## 2014-09-23 DIAGNOSIS — D631 Anemia in chronic kidney disease: Secondary | ICD-10-CM | POA: Diagnosis not present

## 2014-09-25 DIAGNOSIS — Z992 Dependence on renal dialysis: Secondary | ICD-10-CM | POA: Diagnosis not present

## 2014-09-25 DIAGNOSIS — N186 End stage renal disease: Secondary | ICD-10-CM | POA: Diagnosis not present

## 2014-09-26 DIAGNOSIS — N2581 Secondary hyperparathyroidism of renal origin: Secondary | ICD-10-CM | POA: Diagnosis not present

## 2014-09-26 DIAGNOSIS — D631 Anemia in chronic kidney disease: Secondary | ICD-10-CM | POA: Diagnosis not present

## 2014-09-26 DIAGNOSIS — N186 End stage renal disease: Secondary | ICD-10-CM | POA: Diagnosis not present

## 2014-09-28 DIAGNOSIS — N2581 Secondary hyperparathyroidism of renal origin: Secondary | ICD-10-CM | POA: Diagnosis not present

## 2014-09-28 DIAGNOSIS — N186 End stage renal disease: Secondary | ICD-10-CM | POA: Diagnosis not present

## 2014-09-28 DIAGNOSIS — D631 Anemia in chronic kidney disease: Secondary | ICD-10-CM | POA: Diagnosis not present

## 2014-09-30 DIAGNOSIS — N186 End stage renal disease: Secondary | ICD-10-CM | POA: Diagnosis not present

## 2014-09-30 DIAGNOSIS — N2581 Secondary hyperparathyroidism of renal origin: Secondary | ICD-10-CM | POA: Diagnosis not present

## 2014-09-30 DIAGNOSIS — D631 Anemia in chronic kidney disease: Secondary | ICD-10-CM | POA: Diagnosis not present

## 2014-10-03 DIAGNOSIS — N186 End stage renal disease: Secondary | ICD-10-CM | POA: Diagnosis not present

## 2014-10-03 DIAGNOSIS — N2581 Secondary hyperparathyroidism of renal origin: Secondary | ICD-10-CM | POA: Diagnosis not present

## 2014-10-03 DIAGNOSIS — D631 Anemia in chronic kidney disease: Secondary | ICD-10-CM | POA: Diagnosis not present

## 2014-10-05 DIAGNOSIS — N2581 Secondary hyperparathyroidism of renal origin: Secondary | ICD-10-CM | POA: Diagnosis not present

## 2014-10-05 DIAGNOSIS — N186 End stage renal disease: Secondary | ICD-10-CM | POA: Diagnosis not present

## 2014-10-05 DIAGNOSIS — D631 Anemia in chronic kidney disease: Secondary | ICD-10-CM | POA: Diagnosis not present

## 2014-10-07 DIAGNOSIS — N186 End stage renal disease: Secondary | ICD-10-CM | POA: Diagnosis not present

## 2014-10-07 DIAGNOSIS — N2581 Secondary hyperparathyroidism of renal origin: Secondary | ICD-10-CM | POA: Diagnosis not present

## 2014-10-07 DIAGNOSIS — D631 Anemia in chronic kidney disease: Secondary | ICD-10-CM | POA: Diagnosis not present

## 2014-10-10 DIAGNOSIS — N186 End stage renal disease: Secondary | ICD-10-CM | POA: Diagnosis not present

## 2014-10-10 DIAGNOSIS — D631 Anemia in chronic kidney disease: Secondary | ICD-10-CM | POA: Diagnosis not present

## 2014-10-10 DIAGNOSIS — N2581 Secondary hyperparathyroidism of renal origin: Secondary | ICD-10-CM | POA: Diagnosis not present

## 2014-10-12 DIAGNOSIS — N2581 Secondary hyperparathyroidism of renal origin: Secondary | ICD-10-CM | POA: Diagnosis not present

## 2014-10-12 DIAGNOSIS — R300 Dysuria: Secondary | ICD-10-CM | POA: Diagnosis not present

## 2014-10-12 DIAGNOSIS — N186 End stage renal disease: Secondary | ICD-10-CM | POA: Diagnosis not present

## 2014-10-12 DIAGNOSIS — D631 Anemia in chronic kidney disease: Secondary | ICD-10-CM | POA: Diagnosis not present

## 2014-10-17 DIAGNOSIS — D631 Anemia in chronic kidney disease: Secondary | ICD-10-CM | POA: Diagnosis not present

## 2014-10-17 DIAGNOSIS — N2581 Secondary hyperparathyroidism of renal origin: Secondary | ICD-10-CM | POA: Diagnosis not present

## 2014-10-17 DIAGNOSIS — N186 End stage renal disease: Secondary | ICD-10-CM | POA: Diagnosis not present

## 2014-10-19 DIAGNOSIS — N186 End stage renal disease: Secondary | ICD-10-CM | POA: Diagnosis not present

## 2014-10-19 DIAGNOSIS — D631 Anemia in chronic kidney disease: Secondary | ICD-10-CM | POA: Diagnosis not present

## 2014-10-19 DIAGNOSIS — N2581 Secondary hyperparathyroidism of renal origin: Secondary | ICD-10-CM | POA: Diagnosis not present

## 2014-10-21 DIAGNOSIS — D631 Anemia in chronic kidney disease: Secondary | ICD-10-CM | POA: Diagnosis not present

## 2014-10-21 DIAGNOSIS — N186 End stage renal disease: Secondary | ICD-10-CM | POA: Diagnosis not present

## 2014-10-21 DIAGNOSIS — N2581 Secondary hyperparathyroidism of renal origin: Secondary | ICD-10-CM | POA: Diagnosis not present

## 2014-10-24 DIAGNOSIS — N186 End stage renal disease: Secondary | ICD-10-CM | POA: Diagnosis not present

## 2014-10-24 DIAGNOSIS — N2581 Secondary hyperparathyroidism of renal origin: Secondary | ICD-10-CM | POA: Diagnosis not present

## 2014-10-24 DIAGNOSIS — D631 Anemia in chronic kidney disease: Secondary | ICD-10-CM | POA: Diagnosis not present

## 2014-10-26 DIAGNOSIS — I120 Hypertensive chronic kidney disease with stage 5 chronic kidney disease or end stage renal disease: Secondary | ICD-10-CM | POA: Diagnosis not present

## 2014-10-26 DIAGNOSIS — N2581 Secondary hyperparathyroidism of renal origin: Secondary | ICD-10-CM | POA: Diagnosis not present

## 2014-10-26 DIAGNOSIS — D631 Anemia in chronic kidney disease: Secondary | ICD-10-CM | POA: Diagnosis not present

## 2014-10-26 DIAGNOSIS — Z992 Dependence on renal dialysis: Secondary | ICD-10-CM | POA: Diagnosis not present

## 2014-10-26 DIAGNOSIS — N186 End stage renal disease: Secondary | ICD-10-CM | POA: Diagnosis not present

## 2014-10-28 DIAGNOSIS — N2581 Secondary hyperparathyroidism of renal origin: Secondary | ICD-10-CM | POA: Diagnosis not present

## 2014-10-28 DIAGNOSIS — N186 End stage renal disease: Secondary | ICD-10-CM | POA: Diagnosis not present

## 2014-10-28 DIAGNOSIS — D631 Anemia in chronic kidney disease: Secondary | ICD-10-CM | POA: Diagnosis not present

## 2014-10-31 DIAGNOSIS — N2581 Secondary hyperparathyroidism of renal origin: Secondary | ICD-10-CM | POA: Diagnosis not present

## 2014-10-31 DIAGNOSIS — N186 End stage renal disease: Secondary | ICD-10-CM | POA: Diagnosis not present

## 2014-10-31 DIAGNOSIS — D631 Anemia in chronic kidney disease: Secondary | ICD-10-CM | POA: Diagnosis not present

## 2014-11-02 DIAGNOSIS — N186 End stage renal disease: Secondary | ICD-10-CM | POA: Diagnosis not present

## 2014-11-02 DIAGNOSIS — D631 Anemia in chronic kidney disease: Secondary | ICD-10-CM | POA: Diagnosis not present

## 2014-11-02 DIAGNOSIS — N2581 Secondary hyperparathyroidism of renal origin: Secondary | ICD-10-CM | POA: Diagnosis not present

## 2014-11-04 DIAGNOSIS — D631 Anemia in chronic kidney disease: Secondary | ICD-10-CM | POA: Diagnosis not present

## 2014-11-04 DIAGNOSIS — N186 End stage renal disease: Secondary | ICD-10-CM | POA: Diagnosis not present

## 2014-11-04 DIAGNOSIS — N2581 Secondary hyperparathyroidism of renal origin: Secondary | ICD-10-CM | POA: Diagnosis not present

## 2014-11-07 DIAGNOSIS — N2581 Secondary hyperparathyroidism of renal origin: Secondary | ICD-10-CM | POA: Diagnosis not present

## 2014-11-07 DIAGNOSIS — D631 Anemia in chronic kidney disease: Secondary | ICD-10-CM | POA: Diagnosis not present

## 2014-11-07 DIAGNOSIS — N186 End stage renal disease: Secondary | ICD-10-CM | POA: Diagnosis not present

## 2014-11-09 DIAGNOSIS — N186 End stage renal disease: Secondary | ICD-10-CM | POA: Diagnosis not present

## 2014-11-09 DIAGNOSIS — D631 Anemia in chronic kidney disease: Secondary | ICD-10-CM | POA: Diagnosis not present

## 2014-11-09 DIAGNOSIS — N2581 Secondary hyperparathyroidism of renal origin: Secondary | ICD-10-CM | POA: Diagnosis not present

## 2014-11-14 DIAGNOSIS — N186 End stage renal disease: Secondary | ICD-10-CM | POA: Diagnosis not present

## 2014-11-14 DIAGNOSIS — N2581 Secondary hyperparathyroidism of renal origin: Secondary | ICD-10-CM | POA: Diagnosis not present

## 2014-11-14 DIAGNOSIS — D631 Anemia in chronic kidney disease: Secondary | ICD-10-CM | POA: Diagnosis not present

## 2014-11-16 DIAGNOSIS — N186 End stage renal disease: Secondary | ICD-10-CM | POA: Diagnosis not present

## 2014-11-16 DIAGNOSIS — D631 Anemia in chronic kidney disease: Secondary | ICD-10-CM | POA: Diagnosis not present

## 2014-11-16 DIAGNOSIS — N2581 Secondary hyperparathyroidism of renal origin: Secondary | ICD-10-CM | POA: Diagnosis not present

## 2014-11-18 DIAGNOSIS — N2581 Secondary hyperparathyroidism of renal origin: Secondary | ICD-10-CM | POA: Diagnosis not present

## 2014-11-18 DIAGNOSIS — N186 End stage renal disease: Secondary | ICD-10-CM | POA: Diagnosis not present

## 2014-11-18 DIAGNOSIS — D631 Anemia in chronic kidney disease: Secondary | ICD-10-CM | POA: Diagnosis not present

## 2014-11-23 DIAGNOSIS — D631 Anemia in chronic kidney disease: Secondary | ICD-10-CM | POA: Diagnosis not present

## 2014-11-23 DIAGNOSIS — N186 End stage renal disease: Secondary | ICD-10-CM | POA: Diagnosis not present

## 2014-11-23 DIAGNOSIS — N2581 Secondary hyperparathyroidism of renal origin: Secondary | ICD-10-CM | POA: Diagnosis not present

## 2014-11-25 DIAGNOSIS — I120 Hypertensive chronic kidney disease with stage 5 chronic kidney disease or end stage renal disease: Secondary | ICD-10-CM | POA: Diagnosis not present

## 2014-11-25 DIAGNOSIS — D631 Anemia in chronic kidney disease: Secondary | ICD-10-CM | POA: Diagnosis not present

## 2014-11-25 DIAGNOSIS — N186 End stage renal disease: Secondary | ICD-10-CM | POA: Diagnosis not present

## 2014-11-25 DIAGNOSIS — N2581 Secondary hyperparathyroidism of renal origin: Secondary | ICD-10-CM | POA: Diagnosis not present

## 2014-11-25 DIAGNOSIS — Z992 Dependence on renal dialysis: Secondary | ICD-10-CM | POA: Diagnosis not present

## 2014-11-28 DIAGNOSIS — B999 Unspecified infectious disease: Secondary | ICD-10-CM | POA: Diagnosis not present

## 2014-11-28 DIAGNOSIS — N186 End stage renal disease: Secondary | ICD-10-CM | POA: Diagnosis not present

## 2014-11-28 DIAGNOSIS — N2581 Secondary hyperparathyroidism of renal origin: Secondary | ICD-10-CM | POA: Diagnosis not present

## 2014-11-30 DIAGNOSIS — B999 Unspecified infectious disease: Secondary | ICD-10-CM | POA: Diagnosis not present

## 2014-11-30 DIAGNOSIS — N186 End stage renal disease: Secondary | ICD-10-CM | POA: Diagnosis not present

## 2014-11-30 DIAGNOSIS — N2581 Secondary hyperparathyroidism of renal origin: Secondary | ICD-10-CM | POA: Diagnosis not present

## 2014-12-02 DIAGNOSIS — N186 End stage renal disease: Secondary | ICD-10-CM | POA: Diagnosis not present

## 2014-12-02 DIAGNOSIS — B999 Unspecified infectious disease: Secondary | ICD-10-CM | POA: Diagnosis not present

## 2014-12-02 DIAGNOSIS — N2581 Secondary hyperparathyroidism of renal origin: Secondary | ICD-10-CM | POA: Diagnosis not present

## 2014-12-03 ENCOUNTER — Encounter (HOSPITAL_COMMUNITY): Payer: Self-pay | Admitting: *Deleted

## 2014-12-03 ENCOUNTER — Inpatient Hospital Stay (HOSPITAL_COMMUNITY)
Admission: EM | Admit: 2014-12-03 | Discharge: 2014-12-05 | DRG: 698 | Disposition: A | Discharge status: Home / Self Care | Payer: Medicare Other | Attending: Internal Medicine | Admitting: Internal Medicine

## 2014-12-03 DIAGNOSIS — R112 Nausea with vomiting, unspecified: Secondary | ICD-10-CM

## 2014-12-03 DIAGNOSIS — N390 Urinary tract infection, site not specified: Secondary | ICD-10-CM | POA: Diagnosis present

## 2014-12-03 DIAGNOSIS — I10 Essential (primary) hypertension: Secondary | ICD-10-CM | POA: Diagnosis present

## 2014-12-03 DIAGNOSIS — T8351XA Infection and inflammatory reaction due to indwelling urinary catheter, initial encounter: Principal | ICD-10-CM | POA: Diagnosis present

## 2014-12-03 DIAGNOSIS — R509 Fever, unspecified: Secondary | ICD-10-CM | POA: Diagnosis not present

## 2014-12-03 DIAGNOSIS — Z992 Dependence on renal dialysis: Secondary | ICD-10-CM | POA: Diagnosis not present

## 2014-12-03 DIAGNOSIS — N186 End stage renal disease: Secondary | ICD-10-CM | POA: Diagnosis not present

## 2014-12-03 DIAGNOSIS — I120 Hypertensive chronic kidney disease with stage 5 chronic kidney disease or end stage renal disease: Secondary | ICD-10-CM | POA: Diagnosis present

## 2014-12-03 DIAGNOSIS — Y846 Urinary catheterization as the cause of abnormal reaction of the patient, or of later complication, without mention of misadventure at the time of the procedure: Secondary | ICD-10-CM | POA: Diagnosis present

## 2014-12-03 DIAGNOSIS — Q059 Spina bifida, unspecified: Secondary | ICD-10-CM | POA: Diagnosis not present

## 2014-12-03 DIAGNOSIS — D631 Anemia in chronic kidney disease: Secondary | ICD-10-CM | POA: Diagnosis present

## 2014-12-03 DIAGNOSIS — R109 Unspecified abdominal pain: Secondary | ICD-10-CM | POA: Diagnosis not present

## 2014-12-03 DIAGNOSIS — A084 Viral intestinal infection, unspecified: Secondary | ICD-10-CM | POA: Diagnosis present

## 2014-12-03 DIAGNOSIS — R197 Diarrhea, unspecified: Secondary | ICD-10-CM

## 2014-12-03 NOTE — ED Notes (Signed)
Pt c/o n/v x 2 days and body aches

## 2014-12-04 ENCOUNTER — Encounter (HOSPITAL_COMMUNITY): Payer: Self-pay | Admitting: *Deleted

## 2014-12-04 DIAGNOSIS — R112 Nausea with vomiting, unspecified: Secondary | ICD-10-CM | POA: Diagnosis not present

## 2014-12-04 DIAGNOSIS — I120 Hypertensive chronic kidney disease with stage 5 chronic kidney disease or end stage renal disease: Secondary | ICD-10-CM | POA: Diagnosis present

## 2014-12-04 DIAGNOSIS — Q059 Spina bifida, unspecified: Secondary | ICD-10-CM

## 2014-12-04 DIAGNOSIS — B999 Unspecified infectious disease: Secondary | ICD-10-CM | POA: Diagnosis not present

## 2014-12-04 DIAGNOSIS — Y846 Urinary catheterization as the cause of abnormal reaction of the patient, or of later complication, without mention of misadventure at the time of the procedure: Secondary | ICD-10-CM | POA: Diagnosis present

## 2014-12-04 DIAGNOSIS — D631 Anemia in chronic kidney disease: Secondary | ICD-10-CM | POA: Diagnosis not present

## 2014-12-04 DIAGNOSIS — N390 Urinary tract infection, site not specified: Secondary | ICD-10-CM

## 2014-12-04 DIAGNOSIS — N2581 Secondary hyperparathyroidism of renal origin: Secondary | ICD-10-CM | POA: Diagnosis not present

## 2014-12-04 DIAGNOSIS — R197 Diarrhea, unspecified: Secondary | ICD-10-CM | POA: Diagnosis not present

## 2014-12-04 DIAGNOSIS — T8351XA Infection and inflammatory reaction due to indwelling urinary catheter, initial encounter: Secondary | ICD-10-CM | POA: Diagnosis present

## 2014-12-04 DIAGNOSIS — Z992 Dependence on renal dialysis: Secondary | ICD-10-CM

## 2014-12-04 DIAGNOSIS — I10 Essential (primary) hypertension: Secondary | ICD-10-CM | POA: Diagnosis not present

## 2014-12-04 DIAGNOSIS — G43A0 Cyclical vomiting, not intractable: Secondary | ICD-10-CM | POA: Diagnosis not present

## 2014-12-04 DIAGNOSIS — A084 Viral intestinal infection, unspecified: Secondary | ICD-10-CM | POA: Diagnosis present

## 2014-12-04 DIAGNOSIS — N186 End stage renal disease: Secondary | ICD-10-CM | POA: Diagnosis not present

## 2014-12-04 DIAGNOSIS — R109 Unspecified abdominal pain: Secondary | ICD-10-CM | POA: Diagnosis not present

## 2014-12-04 LAB — CBC WITH DIFFERENTIAL/PLATELET
BASOS PCT: 0 % (ref 0–1)
Basophils Absolute: 0 10*3/uL (ref 0.0–0.1)
EOS ABS: 0.1 10*3/uL (ref 0.0–0.7)
Eosinophils Relative: 2 % (ref 0–5)
HCT: 39.3 % (ref 36.0–46.0)
HEMOGLOBIN: 12.5 g/dL (ref 12.0–15.0)
LYMPHS ABS: 1.1 10*3/uL (ref 0.7–4.0)
Lymphocytes Relative: 22 % (ref 12–46)
MCH: 31 pg (ref 26.0–34.0)
MCHC: 31.8 g/dL (ref 30.0–36.0)
MCV: 97.5 fL (ref 78.0–100.0)
MONOS PCT: 8 % (ref 3–12)
Monocytes Absolute: 0.4 10*3/uL (ref 0.1–1.0)
Neutro Abs: 3.4 10*3/uL (ref 1.7–7.7)
Neutrophils Relative %: 68 % (ref 43–77)
Platelets: 131 10*3/uL — ABNORMAL LOW (ref 150–400)
RBC: 4.03 MIL/uL (ref 3.87–5.11)
RDW: 14.4 % (ref 11.5–15.5)
WBC: 4.9 10*3/uL (ref 4.0–10.5)

## 2014-12-04 LAB — URINALYSIS, ROUTINE W REFLEX MICROSCOPIC
Bilirubin Urine: NEGATIVE
Glucose, UA: NEGATIVE mg/dL
Ketones, ur: NEGATIVE mg/dL
Nitrite: NEGATIVE
Protein, ur: 100 mg/dL — AB
SPECIFIC GRAVITY, URINE: 1.02 (ref 1.005–1.030)
Urobilinogen, UA: 0.2 mg/dL (ref 0.0–1.0)
pH: >9 — ABNORMAL HIGH (ref 5.0–8.0)

## 2014-12-04 LAB — COMPREHENSIVE METABOLIC PANEL
ALK PHOS: 65 U/L (ref 38–126)
ALT: 42 U/L (ref 14–54)
AST: 57 U/L — ABNORMAL HIGH (ref 15–41)
Albumin: 3.9 g/dL (ref 3.5–5.0)
Anion gap: 14 (ref 5–15)
BUN: 38 mg/dL — ABNORMAL HIGH (ref 6–20)
CO2: 23 mmol/L (ref 22–32)
Calcium: 8.3 mg/dL — ABNORMAL LOW (ref 8.9–10.3)
Chloride: 98 mmol/L — ABNORMAL LOW (ref 101–111)
Creatinine, Ser: 6.78 mg/dL — ABNORMAL HIGH (ref 0.44–1.00)
GFR calc Af Amer: 9 mL/min — ABNORMAL LOW (ref >60)
GFR, EST NON AFRICAN AMERICAN: 8 mL/min — AB (ref >60)
GLUCOSE: 108 mg/dL — AB (ref 70–99)
POTASSIUM: 3.9 mmol/L (ref 3.5–5.1)
SODIUM: 135 mmol/L (ref 135–145)
Total Bilirubin: 0.6 mg/dL (ref 0.3–1.2)
Total Protein: 7.7 g/dL (ref 6.5–8.1)

## 2014-12-04 LAB — CREATININE, SERUM
Creatinine, Ser: 7.3 mg/dL — ABNORMAL HIGH (ref 0.44–1.00)
GFR calc Af Amer: 8 mL/min — ABNORMAL LOW (ref >60)
GFR calc non Af Amer: 7 mL/min — ABNORMAL LOW (ref >60)

## 2014-12-04 LAB — URINE MICROSCOPIC-ADD ON

## 2014-12-04 LAB — CBC
HEMATOCRIT: 39.7 % (ref 36.0–46.0)
Hemoglobin: 12.4 g/dL (ref 12.0–15.0)
MCH: 31 pg (ref 26.0–34.0)
MCHC: 31.2 g/dL (ref 30.0–36.0)
MCV: 99.3 fL (ref 78.0–100.0)
Platelets: 117 10*3/uL — ABNORMAL LOW (ref 150–400)
RBC: 4 MIL/uL (ref 3.87–5.11)
RDW: 14.5 % (ref 11.5–15.5)
WBC: 4.2 10*3/uL (ref 4.0–10.5)

## 2014-12-04 MED ORDER — SODIUM CHLORIDE 0.9 % IJ SOLN: 3.0000 mL | INTRAMUSCULAR | Status: DC | PRN

## 2014-12-04 MED ORDER — ACETAMINOPHEN 650 MG RE SUPP: 650.0000 mg | Freq: Four times a day (QID) | RECTAL | Status: DC | PRN

## 2014-12-04 MED ORDER — MORPHINE SULFATE 2 MG/ML IJ SOLN
1.0000 mg | INTRAMUSCULAR | Status: DC | PRN
  Administered 2014-12-04 – 2014-12-05 (×2): 1 mg via INTRAVENOUS
  Filled 2014-12-04 (×2): qty 1

## 2014-12-04 MED ORDER — HEPARIN SODIUM (PORCINE) 5000 UNIT/ML IJ SOLN
5000.0000 [IU] | Freq: Three times a day (TID) | INTRAMUSCULAR | Status: DC
  Filled 2014-12-04: qty 1

## 2014-12-04 MED ORDER — PHENAZOPYRIDINE HCL 100 MG PO TABS
100.0000 mg | ORAL_TABLET | Freq: Three times a day (TID) | ORAL | Status: DC
  Administered 2014-12-04: 100 mg via ORAL

## 2014-12-04 MED ORDER — CEFEPIME HCL 2 G IJ SOLR
2.0000 g | INTRAMUSCULAR | Status: DC
  Administered 2014-12-05: 2 g via INTRAVENOUS
  Filled 2014-12-04: qty 2

## 2014-12-04 MED ORDER — ACETAMINOPHEN 325 MG PO TABS
650.0000 mg | ORAL_TABLET | Freq: Once | ORAL | Status: AC
  Administered 2014-12-04: 650 mg via ORAL
  Filled 2014-12-04: qty 2

## 2014-12-04 MED ORDER — SEVELAMER CARBONATE 800 MG PO TABS
2400.0000 mg | ORAL_TABLET | Freq: Three times a day (TID) | ORAL | Status: DC
  Filled 2014-12-04: qty 3

## 2014-12-04 MED ORDER — PHENAZOPYRIDINE HCL 100 MG PO TABS
100.0000 mg | ORAL_TABLET | Freq: Three times a day (TID) | ORAL | Status: DC
  Filled 2014-12-04: qty 1

## 2014-12-04 MED ORDER — SEVELAMER CARBONATE 800 MG PO TABS
800.0000 mg | ORAL_TABLET | Freq: Three times a day (TID) | ORAL | Status: DC
  Administered 2014-12-04: 800 mg via ORAL
  Filled 2014-12-04 (×2): qty 1

## 2014-12-04 MED ORDER — DEXTROSE 5 % IV SOLN
1.0000 g | Freq: Once | INTRAVENOUS | Status: AC
  Administered 2014-12-04: 1 g via INTRAVENOUS
  Filled 2014-12-04: qty 1

## 2014-12-04 MED ORDER — ALPRAZOLAM 0.25 MG PO TABS
0.2500 mg | ORAL_TABLET | Freq: Two times a day (BID) | ORAL | Status: DC | PRN
  Administered 2014-12-04: 0.25 mg via ORAL
  Filled 2014-12-04: qty 1

## 2014-12-04 MED ORDER — SODIUM CHLORIDE 0.9 % IJ SOLN
3.0000 mL | Freq: Two times a day (BID) | INTRAMUSCULAR | Status: DC
  Administered 2014-12-04 – 2014-12-05 (×3): 3 mL via INTRAVENOUS

## 2014-12-04 MED ORDER — HYDROMORPHONE HCL 1 MG/ML IJ SOLN
1.0000 mg | INTRAMUSCULAR | Status: DC | PRN
  Administered 2014-12-04 (×2): 1 mg via INTRAVENOUS
  Filled 2014-12-04 (×2): qty 1

## 2014-12-04 MED ORDER — SODIUM CHLORIDE 0.9 % IV SOLN: 250.0000 mL | INTRAVENOUS | Status: DC | PRN

## 2014-12-04 MED ORDER — CINACALCET HCL 30 MG PO TABS
30.0000 mg | ORAL_TABLET | Freq: Every day | ORAL | Status: DC
  Administered 2014-12-04 – 2014-12-05 (×2): 30 mg via ORAL
  Filled 2014-12-04 (×3): qty 1

## 2014-12-04 MED ORDER — ONDANSETRON HCL 4 MG PO TABS
4.0000 mg | ORAL_TABLET | Freq: Four times a day (QID) | ORAL | Status: DC | PRN
  Administered 2014-12-04 (×2): 4 mg via ORAL
  Filled 2014-12-04 (×2): qty 1

## 2014-12-04 MED ORDER — SODIUM CHLORIDE 0.9 % IV BOLUS (SEPSIS)
500.0000 mL | Freq: Once | INTRAVENOUS | Status: AC
  Administered 2014-12-04: 500 mL via INTRAVENOUS

## 2014-12-04 MED ORDER — ONDANSETRON HCL 4 MG/2ML IJ SOLN
4.0000 mg | Freq: Once | INTRAMUSCULAR | Status: AC
  Administered 2014-12-04: 4 mg via INTRAVENOUS
  Filled 2014-12-04: qty 2

## 2014-12-04 MED ORDER — ACETAMINOPHEN 325 MG PO TABS: 650.0000 mg | ORAL_TABLET | Freq: Four times a day (QID) | ORAL | Status: DC | PRN

## 2014-12-04 MED ORDER — ONDANSETRON HCL 4 MG/2ML IJ SOLN
4.0000 mg | Freq: Four times a day (QID) | INTRAMUSCULAR | Status: DC | PRN
  Administered 2014-12-04 – 2014-12-05 (×3): 4 mg via INTRAVENOUS
  Filled 2014-12-04 (×3): qty 2

## 2014-12-04 NOTE — Consult Note (Signed)
Reason for Consult: End-stage renal disease Referring Physician: Dr. [name] is an 24 y.o. female.  HPI: She is a patient who has history of hypertension,, end-stage as is on maintenance hemodialysis presently came with complaints of nausea, vomiting and fever for the last 2 days. Patient also complains of abdominal pain but no diarrhea. When she was evaluated in the emergency room patient was found to have urinary tract infection hence admitted to the hospital. Presently still she complains of some nausea but no vomiting. She has still abdominal pain but no fever or chills or sweating.  Past Medical History  Diagnosis Date  . Spina bifida   . UTI (lower urinary tract infection)   . HTN (hypertension) 05/02/2011  . Anemia associated with chronic renal failure   . 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  . Blood transfusion   . Dialysis care   . ESRD (end stage renal disease) on dialysis     Past Surgical History  Procedure Laterality Date  . Av fistula placement      left arm    Family History  Problem Relation Age of Onset  . Kidney cancer Other     Social History:  reports that she has never smoked. She does not have any smokeless tobacco history on file. She reports that she does not drink alcohol or use illicit drugs.  Allergies:  Allergies  Allergen Reactions  . Ciprofloxacin Shortness Of Breath, Nausea And Vomiting and Other (See Comments)    HIGH FEVER  . Other Anaphylaxis    Revaclear dialzer  . Peanut-Containing Drug Products Anaphylaxis  . Aleve [Naproxen Sodium] Other (See Comments)    G.I.Bleed  . Influenza Vaccines Nausea And Vomiting    High fever  . Tetanus Toxoids Nausea And Vomiting and Other (See Comments)    HIGH FEVER  . Latex Itching and Rash    Medications: I have reviewed the patient's current medications.  Results for orders placed or performed during the hospital encounter of 12/03/14 (from the past 48  hour(s))  Comprehensive metabolic panel     Status: Abnormal   Collection Time: 12/04/14 12:55 AM  Result Value Ref Range   Sodium 135 135 - 145 mmol/L   Potassium 3.9 3.5 - 5.1 mmol/L   Chloride 98 (L) 101 - 111 mmol/L   CO2 23 22 - 32 mmol/L   Glucose, Bld 108 (H) 70 - 99 mg/dL   BUN 38 (H) 6 - 20 mg/dL   Creatinine, Ser 6.78 (H) 0.44 - 1.00 mg/dL   Calcium 8.3 (L) 8.9 - 10.3 mg/dL   Total Protein 7.7 6.5 - 8.1 g/dL   Albumin 3.9 3.5 - 5.0 g/dL   AST 57 (H) 15 - 41 U/L   ALT 42 14 - 54 U/L   Alkaline Phosphatase 65 38 - 126 U/L   Total Bilirubin 0.6 0.3 - 1.2 mg/dL   GFR calc non Af Amer 8 (L) >60 mL/min   GFR calc Af Amer 9 (L) >60 mL/min    Comment: (NOTE) The eGFR has been calculated using the CKD EPI equation. This calculation has not been validated in all clinical situations. eGFR's persistently <60 mL/min signify possible Chronic Kidney Disease.    Anion gap 14 5 - 15  CBC with Differential     Status: Abnormal   Collection Time: 12/04/14 12:55 AM  Result Value Ref Range   WBC 4.9 4.0 - 10.5 K/uL   RBC  4.03 3.87 - 5.11 MIL/uL   Hemoglobin 12.5 12.0 - 15.0 g/dL   HCT 39.3 36.0 - 46.0 %   MCV 97.5 78.0 - 100.0 fL   MCH 31.0 26.0 - 34.0 pg   MCHC 31.8 30.0 - 36.0 g/dL   RDW 14.4 11.5 - 15.5 %   Platelets 131 (L) 150 - 400 K/uL   Neutrophils Relative % 68 43 - 77 %   Neutro Abs 3.4 1.7 - 7.7 K/uL   Lymphocytes Relative 22 12 - 46 %   Lymphs Abs 1.1 0.7 - 4.0 K/uL   Monocytes Relative 8 3 - 12 %   Monocytes Absolute 0.4 0.1 - 1.0 K/uL   Eosinophils Relative 2 0 - 5 %   Eosinophils Absolute 0.1 0.0 - 0.7 K/uL   Basophils Relative 0 0 - 1 %   Basophils Absolute 0.0 0.0 - 0.1 K/uL  Urinalysis, Routine w reflex microscopic     Status: Abnormal   Collection Time: 12/04/14  1:10 AM  Result Value Ref Range   Color, Urine YELLOW YELLOW   APPearance CLOUDY (A) CLEAR   Specific Gravity, Urine 1.020 1.005 - 1.030   pH >9.0 (H) 5.0 - 8.0   Glucose, UA NEGATIVE NEGATIVE  mg/dL   Hgb urine dipstick MODERATE (A) NEGATIVE   Bilirubin Urine NEGATIVE NEGATIVE   Ketones, ur NEGATIVE NEGATIVE mg/dL   Protein, ur 100 (A) NEGATIVE mg/dL   Urobilinogen, UA 0.2 0.0 - 1.0 mg/dL   Nitrite NEGATIVE NEGATIVE   Leukocytes, UA LARGE (A) NEGATIVE  Urine microscopic-add on     Status: Abnormal   Collection Time: 12/04/14  1:10 AM  Result Value Ref Range   Squamous Epithelial / LPF RARE RARE   WBC, UA TOO NUMEROUS TO COUNT <3 WBC/hpf   RBC / HPF 3-6 <3 RBC/hpf   Bacteria, UA MANY (A) RARE  Blood culture (routine x 2)     Status: None (Preliminary result)   Collection Time: 12/04/14  1:28 AM  Result Value Ref Range   Specimen Description BLOOD RIGHT HAND    Special Requests BOTTLES DRAWN AEROBIC ONLY 6CC    Culture PENDING    Report Status PENDING   Blood culture (routine x 2)     Status: None (Preliminary result)   Collection Time: 12/04/14  1:33 AM  Result Value Ref Range   Specimen Description BLOOD RIGHT HAND    Special Requests BOTTLES DRAWN AEROBIC ONLY 6CC    Culture PENDING    Report Status PENDING     No results found.  Review of Systems  Constitutional: Positive for fever and chills.  Respiratory: Positive for cough. Negative for sputum production and shortness of breath.   Cardiovascular: Negative for orthopnea.  Gastrointestinal: Positive for nausea, vomiting and abdominal pain.  Genitourinary: Negative for dysuria and urgency.  Musculoskeletal: Positive for back pain.   Blood pressure 123/69, pulse 96, temperature 98.8 F (37.1 C), temperature source Oral, resp. rate 18, weight 27.8 kg (61 lb 4.6 oz), SpO2 98 %. Physical Exam  Constitutional: She is oriented to person, place, and time. No distress.  Eyes: No scleral icterus.  Neck: No JVD present.  Cardiovascular: Normal rate.  Exam reveals no gallop.   Respiratory: No respiratory distress. She has no wheezes.  GI: She exhibits no distension. There is no tenderness.  Musculoskeletal: She  exhibits no edema.  Neurological: She is alert and oriented to person, place, and time.    Assessment/Plan: Problem #1 end-stage renal disease:  She is status post hemodialysis on Saturday. Presently her potassium is within normal range. Problem #2 UTI. Presently she is on antibiotics, patient is a febrile with normal white blood cell count. Problem #3 history of nausea, vomiting and abdominal pain possibly gastroenteritis Problem #4 hypertension: Her blood pressure is reasonably controlled Problem #5 anemia: Her hemoglobin is above our target goal. Problem #6 metabolic bone disease her calcium is range Problem #7 acid-base balance: Her CO2 is 23 Lab: We'll make arrangements for patient to get dialysis tomorrow which is her regular schedule. We'll hold Epogen as her hemoglobin is above our target goal. We'll continue his other medications as before.  [name] S 12/04/2014, 9:17 AM

## 2014-12-04 NOTE — Care Management Note (Signed)
Case Management Note  Patient Details  Name: [name] MRN: [id number] Date of Birth: 13-Feb-1991   Expected Discharge Date:                  Expected Discharge Plan:  Home/Self Care  In-House Referral:  NA  Discharge planning Services  CM Consult  Post Acute Care Choice:  NA Choice offered to:  NA  DME Arranged:    DME Agency:     HH Arranged:    [place] Agency:     Status of Service:  Completed, signed off  Medicare Important Message Given:    Date Medicare IM Given:    Medicare IM give by:    Date Additional Medicare IM Given:    Additional Medicare Important Message give by:     If discussed at [place] of Stay Meetings, dates discussed:    Additional Comments: Pt is from home, lives with aunt and grandmother. Pt independent at baseline but has spina bifida and is wheelchair bound. Pt pt is on HD T/th/sa at [facility] in [place], [place]. Pt referred to CM for PCP needs. Pt has been to a medical center in [place] once but does not want to go there, she wants to go somewhere in [place]. Discussed options with patient. Offered to made appointment at [facility] clinic, pt refused and said she wants to make her own appointment once she leaves the hospital. Pt given list of PCP's in the area accepting new patients. No further CM needs.    Sherald Barge, RN 12/04/2014, 1:06 PM

## 2014-12-04 NOTE — Progress Notes (Signed)
Patient ID: [name], female   DOB: 1991-05-08, 24 y.o.   MRN: [id number] [facility] PROGRESS NOTE  JNIYAH MATHIOWETZ [id number] DOB: [birth year] DOA: 12/03/2014 PCP: No PCP Per Patient. We'll ask case management to help establish primary care physician in the community for the patient. Currently she is following with nephrology as primary for purposes of hemodialysis.  Brief narrative:    Addendum to admission note done 12/04/2014.  24 year old female With past medical history spina bifida, urinary tract infection, end-stage renal disease on hemodialysis Tuesday, Thursday and Saturday who presented to [facility] with reports of vomiting, abdominal pain and diarrhea for past day or so prior to this admission. Patient reports associated fevers at home, T max 101 F. her last hemodialysis was on Saturday and she did not complete all 3 hours because of abdominal pain. Off note, she is doing self-catheterization as needed.  On this admission, patient was found to be febrile with T max 101.8 F. her white blood cell count was within normal limits, creatinine was 6.78. Urinalysis revealed large leukocytes. She was empirically started on cefepime for urinary tract infection.  Barrier to discharge: Patient is being treated with cefepime for urinary tract infection. If urine culture result is back by tomorrow then I anticipated discharge 12/05/2014.  Assessment/Plan:    Principal problem: Catheter related urinary tract infection / nausea, vomiting - Urinalysis revealed large leukocytes. Please note patient is doing self-catheterization as needed. She is at risk for catheter associated urinary tract infection. - Patient started on empiric cefepime. Urine culture is pending.  Active Problems: End-stage renal disease on hemodialysis - Hemodialysis Tuesday, Thursday and Saturday - Management per renal. - Nephrology has seen the patient in consultation.  Diarrhea  - Diarrhea along with nausea  and vomiting likely secondary to viral gastroenteritis.  - Diarrhea resolved   DVT Prophylaxis   - Heparin subcutaneous ordered   Code Status: Full.  Family Communication:  plan of care discussed with the patient, no family at the bedside Disposition Plan: Home likely 12/05/2014  IV access:  Peripheral IV  Procedures and diagnostic studies:    No results found.  Medical Consultants:  Nephrology  Other Consultants:  None  IAnti-Infectives:   Cefepime 12/04/2014 -->   Leisa Lenz, MD  [facility] Pager [phone]  Time spent in minutes: 25 minutes  If 7PM-7AM, please contact night-coverage [email or web address] Password [email or web address] 12/04/2014, 11:05 AM   LOS: 0 days    HPI/Subjective: No acute overnight events. Patient reports nausea but no vomiting. Abdominal pain control.  Objective: Filed Vitals:   12/04/14 0121 12/04/14 0234 12/04/14 0410 12/04/14 0423  BP: 115/70 107/53 123/69   Pulse: 103 80 96   Temp: 101 F (38.3 C) 98.7 F (37.1 C)  98.8 F (37.1 C)  TempSrc: Oral Oral    Resp: 18 16 18    Weight:   27.8 kg (61 lb 4.6 oz)   SpO2: 97% 100% 98%     Intake/Output Summary (Last 24 hours) at 12/04/14 1105 Last data filed at 12/04/14 0211  Gross per 24 hour  Intake    750 ml  Output    250 ml  Net    500 ml    Exam:   General:  Pt is alert, follows commands appropriately, not in acute distress  Cardiovascular: Regular rate and rhythm, S1/S2, no murmurs  Respiratory: Clear to auscultation bilaterally, no wheezing, no crackles, no rhonchi  Abdomen: Soft, non tender, non distended, bowel sounds present  Extremities:  No edema, pulses palpable bilaterally  Neuro: Grossly nonfocal  Data Reviewed: Basic Metabolic Panel:  Recent Labs Lab 12/04/14 0055  NA 135  K 3.9  CL 98*  CO2 23  GLUCOSE 108*  BUN 38*  CREATININE 6.78*  CALCIUM 8.3*   Liver Function Tests:  Recent Labs Lab 12/04/14 0055  AST 57*  ALT 42  ALKPHOS 65  BILITOT 0.6   PROT 7.7  ALBUMIN 3.9   No results for input(s): LIPASE, AMYLASE in the last 168 hours. No results for input(s): AMMONIA in the last 168 hours. CBC:  Recent Labs Lab 12/04/14 0055 12/04/14 1012  WBC 4.9 4.2  NEUTROABS 3.4  --   HGB 12.5 12.4  HCT 39.3 39.7  MCV 97.5 99.3  PLT 131* 117*   Cardiac Enzymes: No results for input(s): CKTOTAL, CKMB, CKMBINDEX, TROPONINI in the last 168 hours. BNP: Invalid input(s): POCBNP CBG: No results for input(s): GLUCAP in the last 168 hours.  Recent Results (from the past 240 hour(s))  Urine culture     Status: None (Preliminary result)   Collection Time: 12/04/14  1:10 AM  Result Value Ref Range Status   Specimen Description URINE, CLEAN CATCH  Final   Special Requests NONE  Final   Culture PENDING  Incomplete   Report Status PENDING  Incomplete  Blood culture (routine x 2)     Status: None (Preliminary result)   Collection Time: 12/04/14  1:28 AM  Result Value Ref Range Status   Specimen Description BLOOD RIGHT HAND  Final   Special Requests BOTTLES DRAWN AEROBIC ONLY 6CC  Final   Culture PENDING  Incomplete   Report Status PENDING  Incomplete  Blood culture (routine x 2)     Status: None (Preliminary result)   Collection Time: 12/04/14  1:33 AM  Result Value Ref Range Status   Specimen Description BLOOD RIGHT HAND  Final   Special Requests BOTTLES DRAWN AEROBIC ONLY 6CC  Final   Culture PENDING  Incomplete   Report Status PENDING  Incomplete     Scheduled Meds: . cinacalcet  30 mg Oral Q breakfast  . heparin  5,000 Units Subcutaneous 3 times per day  . sevelamer carbonate  2,400 mg Oral TID WC  . sodium chloride  3 mL Intravenous Q12H   Continuous Infusions:

## 2014-12-04 NOTE — Progress Notes (Signed)
ANTIBIOTIC CONSULT NOTE-Preliminary  Pharmacy Consult for cefepime Indication: UTI  Allergies  Allergen Reactions  . Ciprofloxacin Shortness Of Breath, Nausea And Vomiting and Other (See Comments)    HIGH FEVER  . Other Anaphylaxis    Revaclear dialzer  . Peanut-Containing Drug Products Anaphylaxis  . Aleve [Naproxen Sodium] Other (See Comments)    G.I.Bleed  . Influenza Vaccines Nausea And Vomiting    High fever  . Tetanus Toxoids Nausea And Vomiting and Other (See Comments)    HIGH FEVER  . Latex Itching and Rash    Patient Measurements: Weight: 60 lb (27.216 kg)   Vital Signs: Temp: 98.7 F (37.1 C) (05/09 0234) Temp Source: Oral (05/09 0234) BP: 107/53 mmHg (05/09 0234) Pulse Rate: 80 (05/09 0234)  Labs:  Recent Labs  12/04/14 0055  WBC 4.9  HGB 12.5  PLT 131*  CREATININE 6.78*    CrCl cannot be calculated (Unknown ideal weight.).  No results for input(s): VANCOTROUGH, VANCOPEAK, VANCORANDOM, GENTTROUGH, GENTPEAK, GENTRANDOM, TOBRATROUGH, TOBRAPEAK, TOBRARND, AMIKACINPEAK, AMIKACINTROU, AMIKACIN in the last 72 hours.   Microbiology: Recent Results (from the past 720 hour(s))  Blood culture (routine x 2)     Status: None (Preliminary result)   Collection Time: 12/04/14  1:28 AM  Result Value Ref Range Status   Specimen Description BLOOD RIGHT HAND  Final   Special Requests BOTTLES DRAWN AEROBIC ONLY 6CC  Final   Culture PENDING  Incomplete   Report Status PENDING  Incomplete  Blood culture (routine x 2)     Status: None (Preliminary result)   Collection Time: 12/04/14  1:33 AM  Result Value Ref Range Status   Specimen Description BLOOD RIGHT HAND  Final   Special Requests BOTTLES DRAWN AEROBIC ONLY Bromide  Final   Culture PENDING  Incomplete   Report Status PENDING  Incomplete    Medical History: Past Medical History  Diagnosis Date  . Spina bifida   . UTI (lower urinary tract infection)   . HTN (hypertension) 05/02/2011  . Anemia associated with  chronic renal failure   . 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  . Blood transfusion   . Dialysis care   . ESRD (end stage renal disease) on dialysis     Medications:   Assessment: 24 yo female with ESRD on dialysis, spina bifida, and h/o urinary tract infections, seen in the ED with complaints of N/V and body aches x 2 days.   Goal of Therapy:  Eradicated infection  Plan:  Preliminary review of pertinent patient information completed.  Protocol will be initiated with a one-time dose of cefepime 1 Gm IV.  Forestine Na clinical pharmacist will complete review during morning rounds to assess patient and finalize treatment regimen.  Norberto Sorenson, [facility] 12/04/2014,3:26 AM

## 2014-12-04 NOTE — ED Provider Notes (Signed)
CSN: [id number]     Arrival date & time 12/03/14  2222 History  This chart was scribed for Rolland Porter, MD by Randa Evens, ED Scribe. This patient was seen in room [facility] and the patient's care was started at 12:00 AM.     Chief Complaint  Patient presents with  . Emesis   Patient is a 24 y.o. female presenting with vomiting. The history is provided by the patient. No language interpreter was used.  Emesis Associated symptoms: abdominal pain and diarrhea    HPI Comments: [name] is a 24 y.o. female who presents to the Emergency Department complaining of new emesis x5 per day onset 2 days prior, May 5. Pt reports associated lower abdominal pain, diarrhea x5 per day, nausea, fever max temp 101 PTA. Pt reports let ear pain and HA as well. Pt has tried imodium with relief of the diarrhea. Pt states that her dialysis was Saturday, May 6. Pt states that's she did not complete the entire 3 hours of her dialysis, she finished up 15 minutes early due to her body cramping up and having intermittent low BP's. Pt states she has been on dialysis for the past 4 years. She does not know the etiology of her renal failure. Pt denies any recent sick contacts. Pt has a Hx of UTI but states that her symptoms do not feel like previous UTI. Patient does self caths at home. Pt states she is not an everyday smoker. Pt denies alcohol use. She denies dysuria or frequency. She states they did send a urine sample on Saturday from her dialysis.  ESRD, dialysis Tues, Thursday and Saturdays   No PCP Nephrologist Dr Florene Glen  Past Medical History  Diagnosis Date  . Spina bifida   . UTI (lower urinary tract infection)   . HTN (hypertension) 05/02/2011  . Anemia associated with chronic renal failure   . 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  . Blood transfusion   . Dialysis care   . ESRD (end stage renal disease) on dialysis    Past Surgical History  Procedure Laterality Date  . Av  fistula placement      left arm   Family History  Problem Relation Age of Onset  . Kidney cancer Other    History  Substance Use Topics  . Smoking status: Never Smoker   . Smokeless tobacco: Not on file  . Alcohol Use: No  lives with her GM Pt uses a wheelchair   OB History    No data available     Review of Systems  Constitutional: Positive for fever.  Gastrointestinal: Positive for nausea, vomiting, abdominal pain and diarrhea.  Genitourinary: Negative for dysuria.  All other systems reviewed and are negative.     Allergies  Ciprofloxacin; Other; Peanut-containing drug products; Aleve; Influenza vaccines; Tetanus toxoids; and Latex  Home Medications   Prior to Admission medications   Medication Sig Start Date End Date Taking? Authorizing Provider  cefUROXime (CEFTIN) 500 MG tablet Take 1 tablet (500 mg total) by mouth every other day. Next dose 7/13 at lunch time. 02/04/14   Samuella Cota, MD  cinacalcet (SENSIPAR) 30 MG tablet Take 30 mg by mouth daily.    Historical Provider, MD  Epoetin Alfa (EPOGEN IJ) Inject as directed. Pt gets on dialysis days which are Tuesday and Saturday    Historical Provider, MD  multivitamin (RENA-VIT) TABS tablet Take 1 tablet by mouth daily.    Historical Provider,  MD  sevelamer carbonate (RENVELA) 800 MG tablet Take 2,400 mg by mouth 3 (three) times daily with meals.    Historical Provider, MD  SODIUM BICARBONATE PO Take 3 tablets by mouth daily.    Historical Provider, MD  Vitamin D, Ergocalciferol, (DRISDOL) 50000 UNITS CAPS capsule Take 50,000 Units by mouth every 7 (seven) days. Takes on Thursday    Historical Provider, MD   ED Triage Vitals  Enc Vitals Group     BP 12/03/14 2249 124/97 mmHg     Pulse Rate 12/03/14 2249 111     Resp 12/03/14 2249 18     Temp 12/03/14 2249 101.8 F (38.8 C)     Temp Source 12/03/14 2249 Oral     SpO2 12/03/14 2249 100 %     Weight 12/03/14 2249 60 lb (27.216 kg)     Height --      Head  Cir --      Peak Flow --      Pain Score 12/03/14 2249 10     Pain Loc --      Pain Edu? --      Excl. in [place]? --    Vital signs normal except for fever    Physical Exam  Constitutional: She is oriented to person, place, and time. She appears well-developed and well-nourished.  Non-toxic appearance. She does not appear ill. No distress.  HENT:  Head: Normocephalic and atraumatic.  Right Ear: External ear normal.  Left Ear: Tympanic membrane is erythematous.  Nose: Nose normal. No mucosal edema or rhinorrhea.  Mouth/Throat: Oropharynx is clear and moist and mucous membranes are normal. No dental abscesses or uvula swelling.  Left TM erythematous and slightly dull.   Eyes: Conjunctivae and EOM are normal. Pupils are equal, round, and reactive to light.  Neck: Normal range of motion and full passive range of motion without pain. Neck supple.  Cardiovascular: Regular rhythm and normal heart sounds.  Tachycardia present.  Exam reveals no gallop and no friction rub.   No murmur heard. Pulmonary/Chest: Effort normal and breath sounds normal. No respiratory distress. She has no wheezes. She has no rhonchi. She has no rales. She exhibits no tenderness and no crepitus.  Abdominal: Soft. Normal appearance and bowel sounds are normal. She exhibits no distension. There is no tenderness. There is no rebound and no guarding.  Musculoskeletal: Normal range of motion. She exhibits no edema or tenderness.  Moves all extremities well.   Neurological: She is alert and oriented to person, place, and time. She has normal strength. No cranial nerve deficit.  Skin: Skin is warm, dry and intact. No rash noted. No erythema. No pallor.  Psychiatric: She has a normal mood and affect. Her speech is normal and behavior is normal. Her mood appears not anxious.  Nursing note and vitals reviewed.   ED Course  Procedures (including critical care time)  Medications  sodium chloride 0.9 % bolus 500 mL (0 mLs  Intravenous Stopped 12/04/14 0210)  acetaminophen (TYLENOL) tablet 650 mg (650 mg Oral Given 12/04/14 0111)  ondansetron (ZOFRAN) injection 4 mg (4 mg Intravenous Given 12/04/14 0121)  ceFEPIme (MAXIPIME) 1 g in dextrose 5 % 50 mL IVPB (1 g Intravenous New Bag/Given 12/04/14 0344)    DIAGNOSTIC STUDIES: Oxygen Saturation is 100% on RA, normal by my interpretation.    COORDINATION OF CARE: 12:32 AM-Discussed treatment plan with pt at bedside and pt agreed to plan.   Patient given a 500 mL bolus due to her  history of diarrhea and vomiting.  I reviewed her last urine culture which was from July 2015. She had over 100,000 colonies of pseudomonas aeruginosa that was sensitive to most antibiotics (see below). She was started on cefepime. However I am having pharmacy do the dosing due to her chronic renal failure and her low body weight.  3:10 AM we discussed patient's test results. Because  of her nausea and vomiting she feels like she should be admitted for IV antibiotics.  03:23 Dr Darrick Meigs, admit to med-surg    47mo ago    Specimen Description URINE, CATHETERIZED   Special Requests NONE   Culture Setup Time 02/02/2014 22:53 Performed at [place] >=100,000 COLONIES/ML Performed at [place] Performed at [facility]     Report Status 02/05/2014 FINAL   Organism ID, Bacteria PSEUDOMONAS AERUGINOSA   Resulting Agency [facility]    Culture & Susceptibility      PSEUDOMONAS AERUGINOSA     Antibiotic Sensitivity Microscan Status    CEFEPIME Sensitive <=1 SENSITIVE Final    Method: MIC    CEFTAZIDIME Sensitive <=1 SENSITIVE Final    Method: MIC    CIPROFLOXACIN Sensitive <=0.25 SENSITIVE Final    Method: MIC    GENTAMICIN Sensitive <=1 SENSITIVE Final    Method: MIC    IMIPENEM Sensitive <=0.25 SENSITIVE Final    Method: MIC    PIP/TAZO Sensitive <=4 SENSITIVE Final     Method: MIC    TOBRAMYCIN Sensitive <=1 SENSITIVE Final    Method: MIC    Comments PSEUDOMONAS AERUGINOSA (MIC)    PSEUDOMONAS AERUGINOSA               Specimen Collected: 02/02/14 4:03 PM Last Resulted: 02/05/14 5:56 AM          Labs Review Results for orders placed or performed during the hospital encounter of 12/03/14  Blood culture (routine x 2)  Result Value Ref Range   Specimen Description BLOOD RIGHT HAND    Special Requests BOTTLES DRAWN AEROBIC ONLY 6CC    Culture PENDING    Report Status PENDING   Blood culture (routine x 2)  Result Value Ref Range   Specimen Description BLOOD RIGHT HAND    Special Requests BOTTLES DRAWN AEROBIC ONLY 6CC    Culture PENDING    Report Status PENDING   Urinalysis, Routine w reflex microscopic  Result Value Ref Range   Color, Urine YELLOW YELLOW   APPearance CLOUDY (A) CLEAR   Specific Gravity, Urine 1.020 1.005 - 1.030   pH >9.0 (H) 5.0 - 8.0   Glucose, UA NEGATIVE NEGATIVE mg/dL   Hgb urine dipstick MODERATE (A) NEGATIVE   Bilirubin Urine NEGATIVE NEGATIVE   Ketones, ur NEGATIVE NEGATIVE mg/dL   Protein, ur 100 (A) NEGATIVE mg/dL   Urobilinogen, UA 0.2 0.0 - 1.0 mg/dL   Nitrite NEGATIVE NEGATIVE   Leukocytes, UA LARGE (A) NEGATIVE  Comprehensive metabolic panel  Result Value Ref Range   Sodium 135 135 - 145 mmol/L   Potassium 3.9 3.5 - 5.1 mmol/L   Chloride 98 (L) 101 - 111 mmol/L   CO2 23 22 - 32 mmol/L   Glucose, Bld 108 (H) 70 - 99 mg/dL   BUN 38 (H) 6 - 20 mg/dL   Creatinine, Ser 6.78 (H) 0.44 - 1.00 mg/dL   Calcium 8.3 (L) 8.9 - 10.3 mg/dL   Total Protein 7.7 6.5 - 8.1  g/dL   Albumin 3.9 3.5 - 5.0 g/dL   AST 57 (H) 15 - 41 U/L   ALT 42 14 - 54 U/L   Alkaline Phosphatase 65 38 - 126 U/L   Total Bilirubin 0.6 0.3 - 1.2 mg/dL   GFR calc non Af Amer 8 (L) >60 mL/min   GFR calc Af Amer 9 (L) >60 mL/min   Anion gap 14 5 - 15  CBC with Differential  Result Value Ref Range   WBC 4.9 4.0 - 10.5 K/uL    RBC 4.03 3.87 - 5.11 MIL/uL   Hemoglobin 12.5 12.0 - 15.0 g/dL   HCT 39.3 36.0 - 46.0 %   MCV 97.5 78.0 - 100.0 fL   MCH 31.0 26.0 - 34.0 pg   MCHC 31.8 30.0 - 36.0 g/dL   RDW 14.4 11.5 - 15.5 %   Platelets 131 (L) 150 - 400 K/uL   Neutrophils Relative % 68 43 - 77 %   Neutro Abs 3.4 1.7 - 7.7 K/uL   Lymphocytes Relative 22 12 - 46 %   Lymphs Abs 1.1 0.7 - 4.0 K/uL   Monocytes Relative 8 3 - 12 %   Monocytes Absolute 0.4 0.1 - 1.0 K/uL   Eosinophils Relative 2 0 - 5 %   Eosinophils Absolute 0.1 0.0 - 0.7 K/uL   Basophils Relative 0 0 - 1 %   Basophils Absolute 0.0 0.0 - 0.1 K/uL  Urine microscopic-add on  Result Value Ref Range   Squamous Epithelial / LPF RARE RARE   WBC, UA TOO NUMEROUS TO COUNT <3 WBC/hpf   RBC / HPF 3-6 <3 RBC/hpf   Bacteria, UA MANY (A) RARE   Laboratory interpretation all normal except UTI, chronic renal failure      Imaging Review No results found.   EKG Interpretation None      MDM   Final diagnoses:  Nausea vomiting and diarrhea  Urinary tract infection without hematuria, site unspecified  ESRD on hemodialysis  Fever, unspecified fever cause   Plan admission  Rolland Porter, MD, FACEP   I personally performed the services described in this documentation, which was scribed in my presence. The recorded information has been reviewed and considered.  Rolland Porter, MD, Barbette Or, MD 12/04/14 [phone]

## 2014-12-04 NOTE — H&P (Signed)
PCP:   No PCP Per Patient   Chief Complaint:  Abdominal pain  HPI:  24 year old female who  has a past medical history of Spina bifida; UTI (lower urinary tract infection); HTN (hypertension) (05/02/2011); Anemia associated with chronic renal failure; Caudal regression syndrome; Blood transfusion; Dialysis care; and ESRD (end stage renal disease) on dialysis. today came to the ED complaining of vomiting, abdominal pain and diarrhea. Patient also had temperature of 101 and admission. She took Imodium which improved diarrhea. Patient says that she had dialysis on Saturday and did not complete the entire 3 hours of dialysis she finished up 15 minutes early due to body cramping. Patient has been on dialysis for past 4 years. Patient has has been doing self-catheterization as needed. In 2015 patient had a UTI with Pseudomonas. Today patient has mildly abnormal UA and has been started on cefepime in the ED. She admits to intermittent chest pain, low back pain abdominal pain. The pain in the abdomen she rates as 10/10 intensity.    Allergies:   Allergies  Allergen Reactions  . Ciprofloxacin Shortness Of Breath, Nausea And Vomiting and Other (See Comments)    HIGH FEVER  . Other Anaphylaxis    Revaclear dialzer  . Peanut-Containing Drug Products Anaphylaxis  . Aleve [Naproxen Sodium] Other (See Comments)    G.I.Bleed  . Influenza Vaccines Nausea And Vomiting    High fever  . Tetanus Toxoids Nausea And Vomiting and Other (See Comments)    HIGH FEVER  . Latex Itching and Rash      Past Medical History  Diagnosis Date  . Spina bifida   . UTI (lower urinary tract infection)   . HTN (hypertension) 05/02/2011  . Anemia associated with chronic renal failure   . 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  . Blood transfusion   . Dialysis care   . ESRD (end stage renal disease) on dialysis     Past Surgical History  Procedure Laterality Date  . Av fistula placement     left arm    Prior to Admission medications   Medication Sig Start Date End Date Taking? Authorizing Provider  cefUROXime (CEFTIN) 500 MG tablet Take 1 tablet (500 mg total) by mouth every other day. Next dose 7/13 at lunch time. 02/04/14   Samuella Cota, MD  cinacalcet (SENSIPAR) 30 MG tablet Take 30 mg by mouth daily.    Historical Provider, MD  Epoetin Alfa (EPOGEN IJ) Inject as directed. Pt gets on dialysis days which are Tuesday and Saturday    Historical Provider, MD  multivitamin (RENA-VIT) TABS tablet Take 1 tablet by mouth daily.    Historical Provider, MD  sevelamer carbonate (RENVELA) 800 MG tablet Take 2,400 mg by mouth 3 (three) times daily with meals.    Historical Provider, MD  SODIUM BICARBONATE PO Take 3 tablets by mouth daily.    Historical Provider, MD  Vitamin D, Ergocalciferol, (DRISDOL) 50000 UNITS CAPS capsule Take 50,000 Units by mouth every 7 (seven) days. Takes on Thursday    Historical Provider, MD    Social History:  reports that she has never smoked. She does not have any smokeless tobacco history on file. She reports that she does not drink alcohol or use illicit drugs.  Family History  Problem Relation Age of Onset  . Kidney cancer Other      All the positives are listed in BOLD  Review of Systems:  HEENT: Headache, blurred vision, runny nose,  sore throat Neck: Hypothyroidism, hyperthyroidism,,lymphadenopathy Chest : Shortness of breath, history of COPD, Asthma Heart : Chest pain, history of coronary arterey disease GI:  Nausea, vomiting, diarrhea, constipation, GERD GU: Dysuria, urgency, frequency of urination, hematuria Neuro: Stroke, seizures, syncope Psych: Depression, anxiety, hallucinations   Physical Exam: Blood pressure 123/69, pulse 96, temperature 98.7 F (37.1 C), temperature source Oral, resp. rate 18, weight 27.8 kg (61 lb 4.6 oz), SpO2 98 %. Constitutional:   Patient is a malnourished appearing female* in no acute distress and  cooperative with exam. Head: Normocephalic and atraumatic Mouth: Mucus membranes moist Eyes: PERRL, EOMI, conjunctivae normal Neck: Supple, No Thyromegaly Cardiovascular: RRR, S1 normal, S2 normal Pulmonary/Chest: CTAB, no wheezes, rales, or rhonchi Abdominal: Soft. Non-tender, non-distended, bowel sounds are normal, no masses, organomegaly, or guarding present.  Neurological: A&O x3, Strength is normal and symmetric bilaterally, cranial nerve II-XII are grossly intact, no focal motor deficit, sensory intact to light touch bilaterally.  Extremities : Atrophy of the muscles of lower extremities  Labs on Admission:  Basic Metabolic Panel:  Recent Labs Lab 12/04/14 0055  NA 135  K 3.9  CL 98*  CO2 23  GLUCOSE 108*  BUN 38*  CREATININE 6.78*  CALCIUM 8.3*   Liver Function Tests:  Recent Labs Lab 12/04/14 0055  AST 57*  ALT 42  ALKPHOS 65  BILITOT 0.6  PROT 7.7  ALBUMIN 3.9   No results for input(s): LIPASE, AMYLASE in the last 168 hours. No results for input(s): AMMONIA in the last 168 hours. CBC:  Recent Labs Lab 12/04/14 0055  WBC 4.9  NEUTROABS 3.4  HGB 12.5  HCT 39.3  MCV 97.5  PLT 131*      Assessment/Plan Active Problems:   HTN (hypertension)   Spina bifida   Nausea & vomiting   Abdominal pain   UTI (urinary tract infection)  Gastroenteritis Patient presented with nausea vomiting diarrhea and abdominal pain likely gastroenteritis. Will  continue with Zofran 4 mg every 6 hours when necessary. Start Dilaudid 1 mg every 4 hours p.m. for abdominal pain.  Questionable UTI Patient had fever of 101, mildly abnormal UA, urine culture has been obtained. Continue cefepime and follow urine culture results  End-stage renal disease Patient is on hemodialysis Tuesday Thursday and Saturday. Will consult nephrology for hemodialysis in a.m.  DVT prophylaxis Heparin  Code status: Full code  Family discussion: No family at bedside   Time Spent on  Admission: 17 min  [facility] Pager: [phone] 12/04/2014, 4:57 AM  If 7PM-7AM, please contact night-coverage  [email or web address]  Password [email or web address]

## 2014-12-04 NOTE — Progress Notes (Signed)
ANTIBIOTIC CONSULT NOTE  Pharmacy Consult for Cefepime Indication: UTI  Allergies  Allergen Reactions  . Ciprofloxacin Shortness Of Breath, Nausea And Vomiting and Other (See Comments)    HIGH FEVER  . Other Anaphylaxis    Revaclear dialzer  . Peanut-Containing Drug Products Anaphylaxis  . Aleve [Naproxen Sodium] Other (See Comments)    G.I.Bleed  . Influenza Vaccines Nausea And Vomiting    High fever  . Tetanus Toxoids Nausea And Vomiting and Other (See Comments)    HIGH FEVER  . Latex Itching and Rash    Patient Measurements: Weight: 61 lb 4.6 oz (27.8 kg)  Vital Signs: Temp: 98.8 F (37.1 C) (05/09 0423) Temp Source: Oral (05/09 0234) BP: 123/69 mmHg (05/09 0410) Pulse Rate: 96 (05/09 0410) Intake/Output from previous day: 05/08 0701 - 05/09 0700 In: 750 [P.O.:250; I.V.:500] Out: 250 [Urine:250] Intake/Output from this shift:    Labs:  Recent Labs  12/04/14 0055  WBC 4.9  HGB 12.5  PLT 131*  CREATININE 6.78*   CrCl cannot be calculated (Unknown ideal weight.). No results for input(s): VANCOTROUGH, VANCOPEAK, VANCORANDOM, GENTTROUGH, GENTPEAK, GENTRANDOM, TOBRATROUGH, TOBRAPEAK, TOBRARND, AMIKACINPEAK, AMIKACINTROU, AMIKACIN in the last 72 hours.   Microbiology: Recent Results (from the past 720 hour(s))  Blood culture (routine x 2)     Status: None (Preliminary result)   Collection Time: 12/04/14  1:28 AM  Result Value Ref Range Status   Specimen Description BLOOD RIGHT HAND  Final   Special Requests BOTTLES DRAWN AEROBIC ONLY 6CC  Final   Culture PENDING  Incomplete   Report Status PENDING  Incomplete  Blood culture (routine x 2)     Status: None (Preliminary result)   Collection Time: 12/04/14  1:33 AM  Result Value Ref Range Status   Specimen Description BLOOD RIGHT HAND  Final   Special Requests BOTTLES DRAWN AEROBIC ONLY 6CC  Final   Culture PENDING  Incomplete   Report Status PENDING  Incomplete    Anti-infectives    Start     Dose/Rate  Route Frequency Ordered Stop   12/04/14 0330  ceFEPIme (MAXIPIME) 1 g in dextrose 5 % 50 mL IVPB     1 g 100 mL/hr over 30 Minutes Intravenous  Once 12/04/14 0325 12/04/14 0414      Assessment: 24 yo F presents with abdominal pain & fever (Tm 101.8 F).  She has ESRD requiring HD on TTS.  There was concern for UTI and urine cx obtained at HD on Saturday.  She has hx of Pseudomonas UTI in July 2015 so Cefepime was initiated in ED.   Goal of Therapy:  Eradicate infection.  Plan:  Cefepime 2gm IV qHD F/U cx data from outpatient dialysis center  [name] 12/04/2014,8:20 AM

## 2014-12-04 NOTE — ED Notes (Signed)
Pt states that she can cath herself and usually does, supplies given.

## 2014-12-05 LAB — CBC
HCT: 41.3 % (ref 36.0–46.0)
HEMOGLOBIN: 13.1 g/dL (ref 12.0–15.0)
MCH: 31.2 pg (ref 26.0–34.0)
MCHC: 31.7 g/dL (ref 30.0–36.0)
MCV: 98.3 fL (ref 78.0–100.0)
Platelets: 135 10*3/uL — ABNORMAL LOW (ref 150–400)
RBC: 4.2 MIL/uL (ref 3.87–5.11)
RDW: 14.6 % (ref 11.5–15.5)
WBC: 4.4 10*3/uL (ref 4.0–10.5)

## 2014-12-05 LAB — COMPREHENSIVE METABOLIC PANEL
ALK PHOS: 66 U/L (ref 38–126)
ALT: 40 U/L (ref 14–54)
ANION GAP: 19 — AB (ref 5–15)
AST: 54 U/L — ABNORMAL HIGH (ref 15–41)
Albumin: 4 g/dL (ref 3.5–5.0)
BILIRUBIN TOTAL: 0.8 mg/dL (ref 0.3–1.2)
BUN: 53 mg/dL — AB (ref 6–20)
CHLORIDE: 93 mmol/L — AB (ref 101–111)
CO2: 25 mmol/L (ref 22–32)
CREATININE: 8.29 mg/dL — AB (ref 0.44–1.00)
Calcium: 8.6 mg/dL — ABNORMAL LOW (ref 8.9–10.3)
GFR, EST AFRICAN AMERICAN: 7 mL/min — AB (ref >60)
GFR, EST NON AFRICAN AMERICAN: 6 mL/min — AB (ref >60)
Glucose, Bld: 68 mg/dL — ABNORMAL LOW (ref 70–99)
POTASSIUM: 4.1 mmol/L (ref 3.5–5.1)
SODIUM: 137 mmol/L (ref 135–145)
Total Protein: 8 g/dL (ref 6.5–8.1)

## 2014-12-05 MED ORDER — SODIUM CHLORIDE 0.9 % IV SOLN: 100.0000 mL | INTRAVENOUS | Status: DC | PRN

## 2014-12-05 MED ORDER — SODIUM CHLORIDE 0.9 % IJ SOLN
10.0000 mL | INTRAMUSCULAR | Status: DC | PRN
  Administered 2014-12-05: 10 mL via INTRAVENOUS
  Filled 2014-12-05: qty 10

## 2014-12-05 MED ORDER — LIDOCAINE-PRILOCAINE 2.5-2.5 % EX CREA: 1.0000 "application " | TOPICAL_CREAM | CUTANEOUS | Status: DC | PRN

## 2014-12-05 MED ORDER — ALTEPLASE 2 MG IJ SOLR
2.0000 mg | Freq: Once | INTRAMUSCULAR | Status: DC | PRN
  Filled 2014-12-05: qty 2

## 2014-12-05 MED ORDER — NEPRO/CARBSTEADY PO LIQD: 237.0000 mL | ORAL | Status: DC | PRN

## 2014-12-05 MED ORDER — LIDOCAINE HCL (PF) 1 % IJ SOLN: 5.0000 mL | INTRAMUSCULAR | Status: DC | PRN

## 2014-12-05 MED ORDER — SODIUM CHLORIDE 0.9 % IJ SOLN
INTRAMUSCULAR | Status: AC
  Administered 2014-12-05: 10 mL via INTRAVENOUS
  Filled 2014-12-05: qty 12

## 2014-12-05 MED ORDER — PHENOL 1.4 % MT LIQD
1.0000 | OROMUCOSAL | Status: DC | PRN
Start: 2014-12-05 — End: 2014-12-05
  Filled 2014-12-05: qty 177

## 2014-12-05 MED ORDER — HEPARIN SODIUM (PORCINE) 1000 UNIT/ML DIALYSIS
1000.0000 [IU] | INTRAMUSCULAR | Status: DC | PRN
  Filled 2014-12-05: qty 1

## 2014-12-05 MED ORDER — PENTAFLUOROPROP-TETRAFLUOROETH EX AERO: 1.0000 "application " | INHALATION_SPRAY | CUTANEOUS | Status: DC | PRN

## 2014-12-05 MED ORDER — CIPROFLOXACIN HCL 500 MG PO TABS: 500.0000 mg | ORAL_TABLET | Freq: Two times a day (BID) | ORAL | Status: DC

## 2014-12-05 NOTE — Care Management Note (Signed)
Case Management Note  Patient Details  Name: [name] MRN: [id number] Date of Birth: 31-Aug-1990   Expected Discharge Date:                  Expected Discharge Plan:  Home/Self Care  In-House Referral:  NA  Discharge planning Services  CM Consult  Post Acute Care Choice:  NA Choice offered to:  NA  DME Arranged:    DME Agency:     HH Arranged:    [place] Agency:     Status of Service:  Completed, signed off  Medicare Important Message Given:    Date Medicare IM Given:    Medicare IM give by:    Date Additional Medicare IM Given:    Additional Medicare Important Message give by:     If discussed at [place] of Stay Meetings, dates discussed:    Additional Comments: Pt DC's home today with self care. PCP list given, no CM needs.   Sherald Barge, RN 12/05/2014, 9:41 AM

## 2014-12-05 NOTE — Discharge Summary (Addendum)
Physician Discharge Summary  [name] [id number] DOB: [birth year] DOA: 12/03/2014  PCP: No PCP Per Patient; please note case manager to discuss options or finding PCP. For now patient is using nephrology for purposes of hemodialysis as primary care.  Admit date: 12/03/2014 Discharge date: 12/05/2014  Recommendations for Outpatient Follow-up:  1. Patient insists on going home today. She will be discharged with ciprofloxacin for 5 days on discharge. Urine culture is pending and will be followed up outpatient.  Discharge Diagnoses:  Active Problems:   HTN (hypertension)   Spina bifida   Nausea & vomiting   Abdominal pain   UTI (urinary tract infection)   ESRD on hemodialysis   Nausea vomiting and diarrhea   Urinary tract infectious disease    Discharge Condition: stable   Diet recommendation: as tolerated   History of present illness: 24 year old female With past medical history spina bifida, urinary tract infection, end-stage renal disease on hemodialysis Tuesday, Thursday and Saturday who presented to [facility] ED with reports of vomiting, abdominal pain and diarrhea for past day or so prior to this admission. Patient reports associated fevers at home, T max 101 F. Off note, she is doing self-catheterization as needed.  On this admission, patient was found to be febrile with T max 101.8 F. her white blood cell count was within normal limits, creatinine was 6.78. Urinalysis revealed large leukocytes. She was empirically started on cefepime for urinary tract infection.   Assessment/Plan:    Principal problem: Catheter related urinary tract infection / nausea, vomiting - Urinalysis revealed large leukocytes. Considering patient is doing self-catheterization as needed she is at high risk of catheter related urinary tract infection. - Patient insists on going home today. Urine culture is pending. - Patient was initially on cefepime. We will prescribe Cipro for 5 days on  discharge.  Active Problems: End-stage renal disease on hemodialysis - Hemodialysis Tuesday, Thursday and Saturday - Nephrology saw the patient in consultation. Management per renal.  Diarrhea  - Likely viral gastroenteritis. Diarrhea completely resolved.  Spina bifida - Stable  DVT Prophylaxis  - Heparin subcutaneous ordered   Code Status: Full.  Family Communication: plan of care discussed with the patient, no family at the bedside Disposition Plan: Home likely 12/05/2014  IV access:  Peripheral IV  Procedures and diagnostic studies:   No results found.  Medical Consultants:  Nephrology  Other Consultants:  None  IAnti-Infectives:   Cefepime 12/04/2014 --> 12/05/2014    Signed:  Leisa Lenz, MD  [facility] 12/05/2014, 9:05 AM  Pager #: [phone]  Time spent in minutes: more than 30 minutes  Discharge Exam: Filed Vitals:   12/05/14 0502  BP: 130/63  Pulse: 81  Temp: 98.7 F (37.1 C)  Resp: 18   Filed Vitals:   12/04/14 0423 12/04/14 1603 12/04/14 2223 12/05/14 0502  BP:  109/82 117/67 130/63  Pulse:  86 88 81  Temp: 98.8 F (37.1 C) 98.9 F (37.2 C) 98.8 F (37.1 C) 98.7 F (37.1 C)  TempSrc:  Oral Oral Oral  Resp:  18 18 18   Weight:    26.8 kg (59 lb 1.3 oz)  SpO2:  100% 100% 100%    General: Pt is alert, follows commands appropriately, not in acute distress Cardiovascular: Regular rate and rhythm, S1/S2 +, no murmurs Respiratory: Clear to auscultation bilaterally, no wheezing, no crackles, no rhonchi Abdominal: Soft, non tender, non distended, bowel sounds +, no guarding Extremities: no edema, no cyanosis, pulses palpable bilaterally DP and PT Neuro:  Grossly nonfocal; spina bifida  Discharge Instructions  Discharge Instructions    Call MD for:  difficulty breathing, headache or visual disturbances    Complete by:  As directed      Call MD for:  persistant nausea and vomiting    Complete by:  As  directed      Call MD for:  severe uncontrolled pain    Complete by:  As directed      Diet - low sodium heart healthy    Complete by:  As directed      Discharge instructions    Complete by:  As directed   1. Continue ciprofloxacin for 5 days on discharge for urinary tract infection.     Increase activity slowly    Complete by:  As directed             Medication List    TAKE these medications        acetaminophen 500 MG tablet  Commonly known as:  TYLENOL  Take 1,000 mg by mouth every 6 (six) hours as needed for moderate pain or headache.     ALPRAZolam 0.25 MG tablet  Commonly known as:  XANAX  Take 1 tablet by mouth daily as needed. Before Dialysis and as needed.     cinacalcet 30 MG tablet  Commonly known as:  SENSIPAR  Take 30 mg by mouth daily.     ciprofloxacin 500 MG tablet  Commonly known as:  CIPRO  Take 1 tablet (500 mg total) by mouth 2 (two) times daily.     EPOGEN IJ  Inject as directed. Pt gets on dialysis days which are Tuesday, Thursday, and Saturday.     ibuprofen 200 MG tablet  Commonly known as:  ADVIL,MOTRIN  Take 200 mg by mouth every 6 (six) hours as needed for headache or moderate pain.     multivitamin Tabs tablet  Take 1 tablet by mouth daily.     sevelamer carbonate 800 MG tablet  Commonly known as:  RENVELA  Take 800 mg by mouth 3 (three) times daily with meals.     Vitamin D (Ergocalciferol) 50000 UNITS Caps capsule  Commonly known as:  DRISDOL  Take 50,000 Units by mouth every 7 (seven) days. Takes on Thursday          The results of significant diagnostics from this hospitalization (including imaging, microbiology, ancillary and laboratory) are listed below for reference.    Significant Diagnostic Studies: No results found.  Microbiology: Recent Results (from the past 240 hour(s))  Urine culture     Status: None (Preliminary result)   Collection Time: 12/04/14  1:10 AM  Result Value Ref Range Status   Specimen  Description URINE, CLEAN CATCH  Final   Special Requests NONE  Final   Culture PENDING  Incomplete   Report Status PENDING  Incomplete  Blood culture (routine x 2)     Status: None (Preliminary result)   Collection Time: 12/04/14  1:28 AM  Result Value Ref Range Status   Specimen Description BLOOD RIGHT HAND  Final   Special Requests BOTTLES DRAWN AEROBIC ONLY 6CC  Final   Culture NO GROWTH <24 HRS  Final   Report Status PENDING  Incomplete  Blood culture (routine x 2)     Status: None (Preliminary result)   Collection Time: 12/04/14  1:33 AM  Result Value Ref Range Status   Specimen Description BLOOD RIGHT HAND  Final   Special Requests BOTTLES DRAWN AEROBIC ONLY [place]  Final   Culture NO GROWTH <24 HRS  Final   Report Status PENDING  Incomplete     Labs: Basic Metabolic Panel:  Recent Labs Lab 12/04/14 0055 12/04/14 1012 12/05/14 0520  NA 135  --  137  K 3.9  --  4.1  CL 98*  --  93*  CO2 23  --  25  GLUCOSE 108*  --  68*  BUN 38*  --  53*  CREATININE 6.78* 7.30* 8.29*  CALCIUM 8.3*  --  8.6*   Liver Function Tests:  Recent Labs Lab 12/04/14 0055 12/05/14 0520  AST 57* 54*  ALT 42 40  ALKPHOS 65 66  BILITOT 0.6 0.8  PROT 7.7 8.0  ALBUMIN 3.9 4.0   No results for input(s): LIPASE, AMYLASE in the last 168 hours. No results for input(s): AMMONIA in the last 168 hours. CBC:  Recent Labs Lab 12/04/14 0055 12/04/14 1012 12/05/14 0520  WBC 4.9 4.2 4.4  NEUTROABS 3.4  --   --   HGB 12.5 12.4 13.1  HCT 39.3 39.7 41.3  MCV 97.5 99.3 98.3  PLT 131* 117* 135*   Cardiac Enzymes: No results for input(s): CKTOTAL, CKMB, CKMBINDEX, TROPONINI in the last 168 hours. BNP: BNP (last 3 results) No results for input(s): BNP in the last 8760 hours.  ProBNP (last 3 results) No results for input(s): PROBNP in the last 8760 hours.  CBG: No results for input(s): GLUCAP in the last 168 hours.

## 2014-12-05 NOTE — Progress Notes (Signed)
Patient with orders to be discharge home. Discharge instructions given, patient verbalized understanding. Prescription given. Patient stable. Patient left in private vehicle with grandma.

## 2014-12-05 NOTE — Progress Notes (Signed)
Subjective: Interval History: has no complaint of nuasea or vomiting and denies any difficulty i breathing.  Objective: Vital signs in last 24 hours: Temp:  [98.7 F (37.1 C)-98.9 F (37.2 C)] 98.7 F (37.1 C) (05/10 0502) Pulse Rate:  [81-88] 81 (05/10 0502) Resp:  [18] 18 (05/10 0502) BP: (109-130)/(63-82) 130/63 mmHg (05/10 0502) SpO2:  [100 %] 100 % (05/10 0502) Weight:  [26.8 kg (59 lb 1.3 oz)] 26.8 kg (59 lb 1.3 oz) (05/10 0502) Weight change: -0.416 kg (-14.7 oz)  Intake/Output from previous day: 05/09 0701 - 05/10 0700 In: 890 [P.O.:840; IV Piggyback:50] Out: 500 [Urine:500] Intake/Output this shift:    General appearance: alert, cooperative and no distress Resp: clear to auscultation bilaterally Cardio: regular rate and rhythm, S1, S2 normal, no murmur, click, rub or gallop GI: soft, non-tender; bowel sounds normal; no masses,  no organomegaly Extremities: extremities normal, atraumatic, no cyanosis or edema  Lab Results:  Recent Labs  12/04/14 1012 12/05/14 0520  WBC 4.2 4.4  HGB 12.4 13.1  HCT 39.7 41.3  PLT 117* 135*   BMET:  Recent Labs  12/04/14 0055 12/04/14 1012 12/05/14 0520  NA 135  --  137  K 3.9  --  4.1  CL 98*  --  93*  CO2 23  --  25  GLUCOSE 108*  --  68*  BUN 38*  --  53*  CREATININE 6.78* 7.30* 8.29*  CALCIUM 8.3*  --  8.6*   No results for input(s): PTH in the last 72 hours. Iron Studies: No results for input(s): IRON, TIBC, TRANSFERRIN, FERRITIN in the last 72 hours.  Studies/Results: No results found.  I have reviewed the patient's current medications.  Assessment/Plan: Problem#1 ESRD; Patient presently denies any uremic sign and symptoms. Her potassium is normal Problem#2 UTI: She is afebrile and her white blood cell count is normal  Problem#3 Anemia: Her hemoglobin is above our target goal Problem#4 Nausea and vomiting is much better and she states she is tolerating clear liquid diet Problem#5 Metabolic bone disease:  Her Calcium is in range Problem#6 Fluid management : Patient is asymptomatic Patient wants to go home and refused dialysis stating that she will go to her center . Hence advised to get her dialysis as an out aptient   LOS: 1 day   Pebble Botkin S 12/05/2014,9:14 AM

## 2014-12-05 NOTE — Discharge Instructions (Signed)

## 2014-12-06 LAB — URINE CULTURE: Colony Count: >=100000

## 2014-12-07 ENCOUNTER — Emergency Department (HOSPITAL_COMMUNITY)
Admission: EM | Admit: 2014-12-07 | Discharge: 2014-12-07 | Disposition: A | Discharge status: Home / Self Care | Payer: Medicare Other | Attending: Emergency Medicine | Admitting: Emergency Medicine

## 2014-12-07 ENCOUNTER — Encounter (HOSPITAL_COMMUNITY): Payer: Self-pay | Admitting: *Deleted

## 2014-12-07 DIAGNOSIS — N186 End stage renal disease: Secondary | ICD-10-CM | POA: Insufficient documentation

## 2014-12-07 DIAGNOSIS — N390 Urinary tract infection, site not specified: Secondary | ICD-10-CM | POA: Diagnosis not present

## 2014-12-07 DIAGNOSIS — I120 Hypertensive chronic kidney disease with stage 5 chronic kidney disease or end stage renal disease: Secondary | ICD-10-CM | POA: Insufficient documentation

## 2014-12-07 DIAGNOSIS — R112 Nausea with vomiting, unspecified: Secondary | ICD-10-CM | POA: Insufficient documentation

## 2014-12-07 DIAGNOSIS — Z792 Long term (current) use of antibiotics: Secondary | ICD-10-CM | POA: Diagnosis not present

## 2014-12-07 DIAGNOSIS — Z992 Dependence on renal dialysis: Secondary | ICD-10-CM | POA: Insufficient documentation

## 2014-12-07 DIAGNOSIS — D631 Anemia in chronic kidney disease: Secondary | ICD-10-CM | POA: Diagnosis not present

## 2014-12-07 DIAGNOSIS — Z91040 Latex allergy status: Secondary | ICD-10-CM | POA: Diagnosis not present

## 2014-12-07 DIAGNOSIS — Z79899 Other long term (current) drug therapy: Secondary | ICD-10-CM | POA: Insufficient documentation

## 2014-12-07 DIAGNOSIS — R1110 Vomiting, unspecified: Secondary | ICD-10-CM | POA: Diagnosis present

## 2014-12-07 LAB — BASIC METABOLIC PANEL
ANION GAP: 17 — AB (ref 5–15)
BUN: 46 mg/dL — ABNORMAL HIGH (ref 6–20)
CHLORIDE: 90 mmol/L — AB (ref 101–111)
CO2: 30 mmol/L (ref 22–32)
Calcium: 9.3 mg/dL (ref 8.9–10.3)
Creatinine, Ser: 8.28 mg/dL — ABNORMAL HIGH (ref 0.44–1.00)
GFR calc Af Amer: 7 mL/min — ABNORMAL LOW (ref >60)
GFR calc non Af Amer: 6 mL/min — ABNORMAL LOW (ref >60)
GLUCOSE: 91 mg/dL (ref 65–99)
Potassium: 3.7 mmol/L (ref 3.5–5.1)
SODIUM: 137 mmol/L (ref 135–145)

## 2014-12-07 LAB — CBC
HEMATOCRIT: 44.5 % (ref 36.0–46.0)
HEMOGLOBIN: 14.3 g/dL (ref 12.0–15.0)
MCH: 30.9 pg (ref 26.0–34.0)
MCHC: 32.1 g/dL (ref 30.0–36.0)
MCV: 96.1 fL (ref 78.0–100.0)
Platelets: 114 10*3/uL — ABNORMAL LOW (ref 150–400)
RBC: 4.63 MIL/uL (ref 3.87–5.11)
RDW: 14.1 % (ref 11.5–15.5)
WBC: 3.9 10*3/uL — ABNORMAL LOW (ref 4.0–10.5)

## 2014-12-07 MED ORDER — LEVOFLOXACIN 250 MG PO TABS: 250.0000 mg | ORAL_TABLET | ORAL | Status: DC

## 2014-12-07 MED ORDER — ONDANSETRON 8 MG PO TBDP: ORAL_TABLET | ORAL | Status: DC

## 2014-12-07 MED ORDER — ONDANSETRON HCL 4 MG/2ML IJ SOLN
4.0000 mg | Freq: Once | INTRAMUSCULAR | Status: AC
  Administered 2014-12-07: 4 mg via INTRAVENOUS
  Filled 2014-12-07: qty 2

## 2014-12-07 MED ORDER — LEVOFLOXACIN IN D5W 500 MG/100ML IV SOLN
500.0000 mg | Freq: Once | INTRAVENOUS | Status: AC
  Administered 2014-12-07: 500 mg via INTRAVENOUS
  Filled 2014-12-07: qty 100

## 2014-12-07 NOTE — ED Notes (Signed)
Pt drinking Gatorade. Pt tolerating well at this time. No emesis noted.

## 2014-12-07 NOTE — ED Notes (Signed)
Charge RN Hassan Rowan attempted to d/c pt. Stated that pt vomited approx 300 cm. MD Delo made aware.

## 2014-12-07 NOTE — ED Notes (Signed)
Patient continues to vomit.

## 2014-12-07 NOTE — ED Provider Notes (Signed)
CSN: [id number]     Arrival date & time 12/07/14  1522 History   First MD Initiated Contact with Patient 12/07/14 1731     Chief Complaint  Patient presents with  . Emesis     (Consider location/radiation/quality/duration/timing/severity/associated sxs/prior Treatment) HPI Comments: Patient is a 24 year old female with history of spina bifida and end-stage renal disease for which she is on hemodialysis. She was recently admitted here for urinary tract infection and vomiting. She was discharged at her request on Cipro 2 days ago. She returns here stating that she has been vomiting and is unable to keep anything down. She was due for dialysis today however did not go due to her not feeling well.  I have also received information stating that her urine culture from her prior admission is susceptible to Levaquin.  Patient is a 24 y.o. female presenting with vomiting. The history is provided by the patient.  Emesis Severity:  Moderate Duration:  2 days Timing:  Constant Progression:  Worsening Chronicity:  Recurrent Recent urination:  Decreased Relieved by:  Nothing Worsened by:  Nothing tried Ineffective treatments:  None tried Associated symptoms: abdominal pain   Associated symptoms: no chills and no fever     Past Medical History  Diagnosis Date  . Spina bifida   . UTI (lower urinary tract infection)   . HTN (hypertension) 05/02/2011  . Anemia associated with chronic renal failure   . 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  . Blood transfusion   . Dialysis care   . ESRD (end stage renal disease) on dialysis    Past Surgical History  Procedure Laterality Date  . Av fistula placement      left arm   Family History  Problem Relation Age of Onset  . Kidney cancer Other    History  Substance Use Topics  . Smoking status: Never Smoker   . Smokeless tobacco: Not on file  . Alcohol Use: No   OB History    No data available     Review of Systems   Constitutional: Negative for chills.  Gastrointestinal: Positive for vomiting and abdominal pain.  All other systems reviewed and are negative.     Allergies  Ciprofloxacin; Other; Peanut-containing drug products; Aleve; Influenza vaccines; Tetanus toxoids; and Latex  Home Medications   Prior to Admission medications   Medication Sig Start Date End Date Taking? Authorizing Provider  acetaminophen (TYLENOL) 500 MG tablet Take 1,000 mg by mouth every 6 (six) hours as needed for moderate pain or headache.    Historical Provider, MD  ALPRAZolam Duanne Moron) 0.25 MG tablet Take 1 tablet by mouth daily as needed. Before Dialysis and as needed. 11/09/14   Historical Provider, MD  cinacalcet (SENSIPAR) 30 MG tablet Take 30 mg by mouth daily.    Historical Provider, MD  ciprofloxacin (CIPRO) 500 MG tablet Take 1 tablet (500 mg total) by mouth 2 (two) times daily. 12/05/14   Robbie Lis, MD  Epoetin Alfa (EPOGEN IJ) Inject as directed. Pt gets on dialysis days which are Tuesday, Thursday, and Saturday.    Historical Provider, MD  ibuprofen (ADVIL,MOTRIN) 200 MG tablet Take 200 mg by mouth every 6 (six) hours as needed for headache or moderate pain.    Historical Provider, MD  multivitamin (RENA-VIT) TABS tablet Take 1 tablet by mouth daily.    Historical Provider, MD  sevelamer carbonate (RENVELA) 800 MG tablet Take 800 mg by mouth 3 (three) times daily with meals.  Historical Provider, MD  Vitamin D, Ergocalciferol, (DRISDOL) 50000 UNITS CAPS capsule Take 50,000 Units by mouth every 7 (seven) days. Takes on Thursday    Historical Provider, MD   BP 107/74 mmHg  Pulse 96  Temp(Src) 99.2 F (37.3 C) (Oral)  Resp 16  Wt 58 lb (26.309 kg)  SpO2 100% Physical Exam  Constitutional: She is oriented to person, place, and time. She appears well-nourished. No distress.  HENT:  Head: Normocephalic and atraumatic.  Mouth/Throat: Oropharynx is clear and moist.  Neck: Normal range of motion. Neck supple.   Cardiovascular: Normal rate, regular rhythm and normal heart sounds.   No murmur heard. Pulmonary/Chest: Effort normal and breath sounds normal. No respiratory distress. She has no wheezes.  Abdominal: Soft. Bowel sounds are normal. She exhibits no distension. There is tenderness. There is no rebound and no guarding.  Musculoskeletal:  Patient with severe deformities of the lower extremities and pelvis related to spina bifida.  Neurological: She is alert and oriented to person, place, and time.  Skin: Skin is warm and dry. She is not diaphoretic.  Nursing note and vitals reviewed.   ED Course  Procedures (including critical care time) Labs Review Labs Reviewed  CBC - Abnormal; Notable for the following:    WBC 3.9 (*)    Platelets 114 (*)    All other components within normal limits  BASIC METABOLIC PANEL - Abnormal; Notable for the following:    Chloride 90 (*)    BUN 46 (*)    Creatinine, Ser 8.28 (*)    GFR calc non Af Amer 6 (*)    GFR calc Af Amer 7 (*)    Anion gap 17 (*)    All other components within normal limits  URINALYSIS, ROUTINE W REFLEX MICROSCOPIC    Imaging Review No results found.   EKG Interpretation None      MDM   Final diagnoses:  None    Patient is a 24 year old female with history of spina bifida and end-stage renal disease for which she is on hemodialysis. She was recently admitted, then discharged at her request. She was treated for a urinary tract infection with Cipro. The urine culture shows good sensitivity to Levaquin and I was given recommendations for this to be changed. She was given IV Levaquin in the emergency department and tolerated this well. She was also able to tolerate an entire bottle of Gatorade without difficulty after receiving Zofran. Her electrolytes are essentially at her baseline and I see no indication for admission. When I mentioned this to her the possibility of discharge, she became somewhat unhappy with  me.    Veryl Speak, MD 12/07/14 2000

## 2014-12-07 NOTE — ED Notes (Signed)
Pt states vomiting has continued since being discharged from the hospital on the 8th. Unable to keep anything down per pt. Pt received dialysis on T, Th, Sat.

## 2014-12-07 NOTE — ED Notes (Signed)
MD Delo at bedside. 

## 2014-12-07 NOTE — ED Notes (Signed)
Pt c/o of dizziness. MD Delo made aware.

## 2014-12-07 NOTE — Progress Notes (Signed)
Pharmacy Note:  Initial antibiotics for Levaquin ordered by EDP for UTI.  Estimated Creatinine Clearance: 0.8 mL/min (by C-G formula based on Cr of 8.28).   Allergies  Allergen Reactions  . Ciprofloxacin Shortness Of Breath, Nausea And Vomiting and Other (See Comments)    HIGH FEVER  . Other Anaphylaxis    Revaclear dialzer  . Peanut-Containing Drug Products Anaphylaxis  . Aleve [Naproxen Sodium] Other (See Comments)    G.I.Bleed  . Influenza Vaccines Nausea And Vomiting    High fever  . Tetanus Toxoids Nausea And Vomiting and Other (See Comments)    HIGH FEVER  . Latex Itching and Rash    Filed Vitals:   12/07/14 1730  BP: 107/74  Pulse: 96  Temp:   Resp: 16    Anti-infectives    None     Cipro allergy noted, however patient currently on Cipro @ home.  No complications noted.  Plan: Initial doses of Levaquin 500mg  IV X 1 ordered. F/U admission orders for further dosing if therapy continued.  Pricilla Larsson, [facility] 12/07/2014 6:27 PM

## 2014-12-07 NOTE — Discharge Instructions (Signed)
Stop taking your Cipro.  Start taking Levaquin as prescribed today.  Zofran as prescribed as needed for nausea.  Return to the ER if your symptoms significantly worsen or change.   Urinary Tract Infection Urinary tract infections (UTIs) can develop anywhere along your urinary tract. Your urinary tract is your body's drainage system for removing wastes and extra water. Your urinary tract includes two kidneys, two ureters, a bladder, and a urethra. Your kidneys are a pair of bean-shaped organs. Each kidney is about the size of your fist. They are located below your ribs, one on each side of your spine. CAUSES Infections are caused by microbes, which are microscopic organisms, including fungi, viruses, and bacteria. These organisms are so small that they can only be seen through a microscope. Bacteria are the microbes that most commonly cause UTIs. SYMPTOMS  Symptoms of UTIs may vary by age and gender of the patient and by the location of the infection. Symptoms in young women typically include a frequent and intense urge to urinate and a painful, burning feeling in the bladder or urethra during urination. Older women and men are more likely to be tired, shaky, and weak and have muscle aches and abdominal pain. A fever may mean the infection is in your kidneys. Other symptoms of a kidney infection include pain in your back or sides below the ribs, nausea, and vomiting. DIAGNOSIS To diagnose a UTI, your caregiver will ask you about your symptoms. Your caregiver also will ask to provide a urine sample. The urine sample will be tested for bacteria and white blood cells. White blood cells are made by your body to help fight infection. TREATMENT  Typically, UTIs can be treated with medication. Because most UTIs are caused by a bacterial infection, they usually can be treated with the use of antibiotics. The choice of antibiotic and length of treatment depend on your symptoms and the type of bacteria  causing your infection. HOME CARE INSTRUCTIONS  If you were prescribed antibiotics, take them exactly as your caregiver instructs you. Finish the medication even if you feel better after you have only taken some of the medication.  Drink enough water and fluids to keep your urine clear or pale yellow.  Avoid caffeine, tea, and carbonated beverages. They tend to irritate your bladder.  Empty your bladder often. Avoid holding urine for long periods of time.  Empty your bladder before and after sexual intercourse.  After a bowel movement, women should cleanse from front to back. Use each tissue only once. SEEK MEDICAL CARE IF:   You have back pain.  You develop a fever.  Your symptoms do not begin to resolve within 3 days. SEEK IMMEDIATE MEDICAL CARE IF:   You have severe back pain or lower abdominal pain.  You develop chills.  You have nausea or vomiting.  You have continued burning or discomfort with urination. MAKE SURE YOU:   Understand these instructions.  Will watch your condition.  Will get help right away if you are not doing well or get worse. Document Released: 04/23/2005 Document Revised: 01/13/2012 Document Reviewed: 08/22/2011 [facility] Patient Information 2015 [place], [place]. This information is not intended to replace advice given to you by your health care provider. Make sure you discuss any questions you have with your health care provider.

## 2014-12-09 DIAGNOSIS — N186 End stage renal disease: Secondary | ICD-10-CM | POA: Diagnosis not present

## 2014-12-09 DIAGNOSIS — N2581 Secondary hyperparathyroidism of renal origin: Secondary | ICD-10-CM | POA: Diagnosis not present

## 2014-12-09 DIAGNOSIS — B999 Unspecified infectious disease: Secondary | ICD-10-CM | POA: Diagnosis not present

## 2014-12-09 LAB — CULTURE, BLOOD (ROUTINE X 2)
Culture: NO GROWTH
Culture: NO GROWTH

## 2014-12-12 DIAGNOSIS — N186 End stage renal disease: Secondary | ICD-10-CM | POA: Diagnosis not present

## 2014-12-12 DIAGNOSIS — N2581 Secondary hyperparathyroidism of renal origin: Secondary | ICD-10-CM | POA: Diagnosis not present

## 2014-12-12 DIAGNOSIS — B999 Unspecified infectious disease: Secondary | ICD-10-CM | POA: Diagnosis not present

## 2014-12-14 DIAGNOSIS — N186 End stage renal disease: Secondary | ICD-10-CM | POA: Diagnosis not present

## 2014-12-14 DIAGNOSIS — B999 Unspecified infectious disease: Secondary | ICD-10-CM | POA: Diagnosis not present

## 2014-12-14 DIAGNOSIS — N2581 Secondary hyperparathyroidism of renal origin: Secondary | ICD-10-CM | POA: Diagnosis not present

## 2014-12-16 DIAGNOSIS — N2581 Secondary hyperparathyroidism of renal origin: Secondary | ICD-10-CM | POA: Diagnosis not present

## 2014-12-16 DIAGNOSIS — B999 Unspecified infectious disease: Secondary | ICD-10-CM | POA: Diagnosis not present

## 2014-12-16 DIAGNOSIS — N186 End stage renal disease: Secondary | ICD-10-CM | POA: Diagnosis not present

## 2014-12-19 DIAGNOSIS — B999 Unspecified infectious disease: Secondary | ICD-10-CM | POA: Diagnosis not present

## 2014-12-19 DIAGNOSIS — N2581 Secondary hyperparathyroidism of renal origin: Secondary | ICD-10-CM | POA: Diagnosis not present

## 2014-12-19 DIAGNOSIS — N186 End stage renal disease: Secondary | ICD-10-CM | POA: Diagnosis not present

## 2014-12-21 DIAGNOSIS — N2581 Secondary hyperparathyroidism of renal origin: Secondary | ICD-10-CM | POA: Diagnosis not present

## 2014-12-21 DIAGNOSIS — B999 Unspecified infectious disease: Secondary | ICD-10-CM | POA: Diagnosis not present

## 2014-12-21 DIAGNOSIS — N186 End stage renal disease: Secondary | ICD-10-CM | POA: Diagnosis not present

## 2014-12-23 DIAGNOSIS — N2581 Secondary hyperparathyroidism of renal origin: Secondary | ICD-10-CM | POA: Diagnosis not present

## 2014-12-23 DIAGNOSIS — N186 End stage renal disease: Secondary | ICD-10-CM | POA: Diagnosis not present

## 2014-12-23 DIAGNOSIS — B999 Unspecified infectious disease: Secondary | ICD-10-CM | POA: Diagnosis not present

## 2014-12-26 DIAGNOSIS — Z992 Dependence on renal dialysis: Secondary | ICD-10-CM | POA: Diagnosis not present

## 2014-12-26 DIAGNOSIS — N186 End stage renal disease: Secondary | ICD-10-CM | POA: Diagnosis not present

## 2014-12-26 DIAGNOSIS — B999 Unspecified infectious disease: Secondary | ICD-10-CM | POA: Diagnosis not present

## 2014-12-26 DIAGNOSIS — N2581 Secondary hyperparathyroidism of renal origin: Secondary | ICD-10-CM | POA: Diagnosis not present

## 2014-12-26 DIAGNOSIS — I120 Hypertensive chronic kidney disease with stage 5 chronic kidney disease or end stage renal disease: Secondary | ICD-10-CM | POA: Diagnosis not present

## 2014-12-28 DIAGNOSIS — N186 End stage renal disease: Secondary | ICD-10-CM | POA: Diagnosis not present

## 2014-12-28 DIAGNOSIS — D631 Anemia in chronic kidney disease: Secondary | ICD-10-CM | POA: Diagnosis not present

## 2014-12-28 DIAGNOSIS — N2581 Secondary hyperparathyroidism of renal origin: Secondary | ICD-10-CM | POA: Diagnosis not present

## 2014-12-30 DIAGNOSIS — N2581 Secondary hyperparathyroidism of renal origin: Secondary | ICD-10-CM | POA: Diagnosis not present

## 2014-12-30 DIAGNOSIS — D631 Anemia in chronic kidney disease: Secondary | ICD-10-CM | POA: Diagnosis not present

## 2014-12-30 DIAGNOSIS — N186 End stage renal disease: Secondary | ICD-10-CM | POA: Diagnosis not present

## 2015-01-02 DIAGNOSIS — N2581 Secondary hyperparathyroidism of renal origin: Secondary | ICD-10-CM | POA: Diagnosis not present

## 2015-01-02 DIAGNOSIS — D631 Anemia in chronic kidney disease: Secondary | ICD-10-CM | POA: Diagnosis not present

## 2015-01-02 DIAGNOSIS — N186 End stage renal disease: Secondary | ICD-10-CM | POA: Diagnosis not present

## 2015-01-04 DIAGNOSIS — D631 Anemia in chronic kidney disease: Secondary | ICD-10-CM | POA: Diagnosis not present

## 2015-01-04 DIAGNOSIS — N2581 Secondary hyperparathyroidism of renal origin: Secondary | ICD-10-CM | POA: Diagnosis not present

## 2015-01-04 DIAGNOSIS — N186 End stage renal disease: Secondary | ICD-10-CM | POA: Diagnosis not present

## 2015-01-06 DIAGNOSIS — D631 Anemia in chronic kidney disease: Secondary | ICD-10-CM | POA: Diagnosis not present

## 2015-01-06 DIAGNOSIS — N186 End stage renal disease: Secondary | ICD-10-CM | POA: Diagnosis not present

## 2015-01-06 DIAGNOSIS — N2581 Secondary hyperparathyroidism of renal origin: Secondary | ICD-10-CM | POA: Diagnosis not present

## 2015-01-09 DIAGNOSIS — D631 Anemia in chronic kidney disease: Secondary | ICD-10-CM | POA: Diagnosis not present

## 2015-01-09 DIAGNOSIS — N2581 Secondary hyperparathyroidism of renal origin: Secondary | ICD-10-CM | POA: Diagnosis not present

## 2015-01-09 DIAGNOSIS — N186 End stage renal disease: Secondary | ICD-10-CM | POA: Diagnosis not present

## 2015-01-11 DIAGNOSIS — D631 Anemia in chronic kidney disease: Secondary | ICD-10-CM | POA: Diagnosis not present

## 2015-01-11 DIAGNOSIS — N186 End stage renal disease: Secondary | ICD-10-CM | POA: Diagnosis not present

## 2015-01-11 DIAGNOSIS — N2581 Secondary hyperparathyroidism of renal origin: Secondary | ICD-10-CM | POA: Diagnosis not present

## 2015-01-13 DIAGNOSIS — D631 Anemia in chronic kidney disease: Secondary | ICD-10-CM | POA: Diagnosis not present

## 2015-01-13 DIAGNOSIS — N2581 Secondary hyperparathyroidism of renal origin: Secondary | ICD-10-CM | POA: Diagnosis not present

## 2015-01-13 DIAGNOSIS — N186 End stage renal disease: Secondary | ICD-10-CM | POA: Diagnosis not present

## 2015-01-16 DIAGNOSIS — N2581 Secondary hyperparathyroidism of renal origin: Secondary | ICD-10-CM | POA: Diagnosis not present

## 2015-01-16 DIAGNOSIS — D631 Anemia in chronic kidney disease: Secondary | ICD-10-CM | POA: Diagnosis not present

## 2015-01-16 DIAGNOSIS — N186 End stage renal disease: Secondary | ICD-10-CM | POA: Diagnosis not present

## 2015-01-18 DIAGNOSIS — N2581 Secondary hyperparathyroidism of renal origin: Secondary | ICD-10-CM | POA: Diagnosis not present

## 2015-01-18 DIAGNOSIS — N186 End stage renal disease: Secondary | ICD-10-CM | POA: Diagnosis not present

## 2015-01-18 DIAGNOSIS — D631 Anemia in chronic kidney disease: Secondary | ICD-10-CM | POA: Diagnosis not present

## 2015-01-20 DIAGNOSIS — N2581 Secondary hyperparathyroidism of renal origin: Secondary | ICD-10-CM | POA: Diagnosis not present

## 2015-01-20 DIAGNOSIS — D631 Anemia in chronic kidney disease: Secondary | ICD-10-CM | POA: Diagnosis not present

## 2015-01-20 DIAGNOSIS — N186 End stage renal disease: Secondary | ICD-10-CM | POA: Diagnosis not present

## 2015-01-23 DIAGNOSIS — N2581 Secondary hyperparathyroidism of renal origin: Secondary | ICD-10-CM | POA: Diagnosis not present

## 2015-01-23 DIAGNOSIS — D631 Anemia in chronic kidney disease: Secondary | ICD-10-CM | POA: Diagnosis not present

## 2015-01-23 DIAGNOSIS — N186 End stage renal disease: Secondary | ICD-10-CM | POA: Diagnosis not present

## 2015-01-25 DIAGNOSIS — N2581 Secondary hyperparathyroidism of renal origin: Secondary | ICD-10-CM | POA: Diagnosis not present

## 2015-01-25 DIAGNOSIS — N186 End stage renal disease: Secondary | ICD-10-CM | POA: Diagnosis not present

## 2015-01-25 DIAGNOSIS — Z992 Dependence on renal dialysis: Secondary | ICD-10-CM | POA: Diagnosis not present

## 2015-01-25 DIAGNOSIS — D631 Anemia in chronic kidney disease: Secondary | ICD-10-CM | POA: Diagnosis not present

## 2015-01-25 DIAGNOSIS — I120 Hypertensive chronic kidney disease with stage 5 chronic kidney disease or end stage renal disease: Secondary | ICD-10-CM | POA: Diagnosis not present

## 2015-01-30 DIAGNOSIS — N2581 Secondary hyperparathyroidism of renal origin: Secondary | ICD-10-CM | POA: Diagnosis not present

## 2015-01-30 DIAGNOSIS — N186 End stage renal disease: Secondary | ICD-10-CM | POA: Diagnosis not present

## 2015-02-01 DIAGNOSIS — N2581 Secondary hyperparathyroidism of renal origin: Secondary | ICD-10-CM | POA: Diagnosis not present

## 2015-02-01 DIAGNOSIS — N186 End stage renal disease: Secondary | ICD-10-CM | POA: Diagnosis not present

## 2015-02-03 DIAGNOSIS — N2581 Secondary hyperparathyroidism of renal origin: Secondary | ICD-10-CM | POA: Diagnosis not present

## 2015-02-03 DIAGNOSIS — N186 End stage renal disease: Secondary | ICD-10-CM | POA: Diagnosis not present

## 2015-02-06 DIAGNOSIS — N186 End stage renal disease: Secondary | ICD-10-CM | POA: Diagnosis not present

## 2015-02-06 DIAGNOSIS — N2581 Secondary hyperparathyroidism of renal origin: Secondary | ICD-10-CM | POA: Diagnosis not present

## 2015-02-08 DIAGNOSIS — N186 End stage renal disease: Secondary | ICD-10-CM | POA: Diagnosis not present

## 2015-02-08 DIAGNOSIS — N2581 Secondary hyperparathyroidism of renal origin: Secondary | ICD-10-CM | POA: Diagnosis not present

## 2015-02-10 DIAGNOSIS — N186 End stage renal disease: Secondary | ICD-10-CM | POA: Diagnosis not present

## 2015-02-10 DIAGNOSIS — N2581 Secondary hyperparathyroidism of renal origin: Secondary | ICD-10-CM | POA: Diagnosis not present

## 2015-02-13 DIAGNOSIS — N186 End stage renal disease: Secondary | ICD-10-CM | POA: Diagnosis not present

## 2015-02-13 DIAGNOSIS — N2581 Secondary hyperparathyroidism of renal origin: Secondary | ICD-10-CM | POA: Diagnosis not present

## 2015-02-15 DIAGNOSIS — N186 End stage renal disease: Secondary | ICD-10-CM | POA: Diagnosis not present

## 2015-02-15 DIAGNOSIS — N2581 Secondary hyperparathyroidism of renal origin: Secondary | ICD-10-CM | POA: Diagnosis not present

## 2015-02-20 DIAGNOSIS — N2581 Secondary hyperparathyroidism of renal origin: Secondary | ICD-10-CM | POA: Diagnosis not present

## 2015-02-20 DIAGNOSIS — N186 End stage renal disease: Secondary | ICD-10-CM | POA: Diagnosis not present

## 2015-02-22 DIAGNOSIS — N186 End stage renal disease: Secondary | ICD-10-CM | POA: Diagnosis not present

## 2015-02-22 DIAGNOSIS — N2581 Secondary hyperparathyroidism of renal origin: Secondary | ICD-10-CM | POA: Diagnosis not present

## 2015-02-24 DIAGNOSIS — N186 End stage renal disease: Secondary | ICD-10-CM | POA: Diagnosis not present

## 2015-02-24 DIAGNOSIS — N2581 Secondary hyperparathyroidism of renal origin: Secondary | ICD-10-CM | POA: Diagnosis not present

## 2015-02-25 DIAGNOSIS — N186 End stage renal disease: Secondary | ICD-10-CM | POA: Diagnosis not present

## 2015-02-25 DIAGNOSIS — I120 Hypertensive chronic kidney disease with stage 5 chronic kidney disease or end stage renal disease: Secondary | ICD-10-CM | POA: Diagnosis not present

## 2015-02-25 DIAGNOSIS — Z992 Dependence on renal dialysis: Secondary | ICD-10-CM | POA: Diagnosis not present

## 2015-02-27 DIAGNOSIS — N2581 Secondary hyperparathyroidism of renal origin: Secondary | ICD-10-CM | POA: Diagnosis not present

## 2015-02-27 DIAGNOSIS — N186 End stage renal disease: Secondary | ICD-10-CM | POA: Diagnosis not present

## 2015-02-27 DIAGNOSIS — D631 Anemia in chronic kidney disease: Secondary | ICD-10-CM | POA: Diagnosis not present

## 2015-03-01 DIAGNOSIS — N2581 Secondary hyperparathyroidism of renal origin: Secondary | ICD-10-CM | POA: Diagnosis not present

## 2015-03-01 DIAGNOSIS — D631 Anemia in chronic kidney disease: Secondary | ICD-10-CM | POA: Diagnosis not present

## 2015-03-01 DIAGNOSIS — N186 End stage renal disease: Secondary | ICD-10-CM | POA: Diagnosis not present

## 2015-03-03 DIAGNOSIS — N186 End stage renal disease: Secondary | ICD-10-CM | POA: Diagnosis not present

## 2015-03-03 DIAGNOSIS — N2581 Secondary hyperparathyroidism of renal origin: Secondary | ICD-10-CM | POA: Diagnosis not present

## 2015-03-03 DIAGNOSIS — D631 Anemia in chronic kidney disease: Secondary | ICD-10-CM | POA: Diagnosis not present

## 2015-03-06 DIAGNOSIS — N2581 Secondary hyperparathyroidism of renal origin: Secondary | ICD-10-CM | POA: Diagnosis not present

## 2015-03-06 DIAGNOSIS — N186 End stage renal disease: Secondary | ICD-10-CM | POA: Diagnosis not present

## 2015-03-06 DIAGNOSIS — D631 Anemia in chronic kidney disease: Secondary | ICD-10-CM | POA: Diagnosis not present

## 2015-03-08 DIAGNOSIS — N186 End stage renal disease: Secondary | ICD-10-CM | POA: Diagnosis not present

## 2015-03-08 DIAGNOSIS — D631 Anemia in chronic kidney disease: Secondary | ICD-10-CM | POA: Diagnosis not present

## 2015-03-08 DIAGNOSIS — N2581 Secondary hyperparathyroidism of renal origin: Secondary | ICD-10-CM | POA: Diagnosis not present

## 2015-03-10 DIAGNOSIS — N2581 Secondary hyperparathyroidism of renal origin: Secondary | ICD-10-CM | POA: Diagnosis not present

## 2015-03-10 DIAGNOSIS — N186 End stage renal disease: Secondary | ICD-10-CM | POA: Diagnosis not present

## 2015-03-10 DIAGNOSIS — D631 Anemia in chronic kidney disease: Secondary | ICD-10-CM | POA: Diagnosis not present

## 2015-03-13 DIAGNOSIS — D631 Anemia in chronic kidney disease: Secondary | ICD-10-CM | POA: Diagnosis not present

## 2015-03-13 DIAGNOSIS — N186 End stage renal disease: Secondary | ICD-10-CM | POA: Diagnosis not present

## 2015-03-13 DIAGNOSIS — N2581 Secondary hyperparathyroidism of renal origin: Secondary | ICD-10-CM | POA: Diagnosis not present

## 2015-03-15 DIAGNOSIS — D631 Anemia in chronic kidney disease: Secondary | ICD-10-CM | POA: Diagnosis not present

## 2015-03-15 DIAGNOSIS — N2581 Secondary hyperparathyroidism of renal origin: Secondary | ICD-10-CM | POA: Diagnosis not present

## 2015-03-15 DIAGNOSIS — N186 End stage renal disease: Secondary | ICD-10-CM | POA: Diagnosis not present

## 2015-03-17 DIAGNOSIS — D631 Anemia in chronic kidney disease: Secondary | ICD-10-CM | POA: Diagnosis not present

## 2015-03-17 DIAGNOSIS — N186 End stage renal disease: Secondary | ICD-10-CM | POA: Diagnosis not present

## 2015-03-17 DIAGNOSIS — N2581 Secondary hyperparathyroidism of renal origin: Secondary | ICD-10-CM | POA: Diagnosis not present

## 2015-03-20 DIAGNOSIS — D631 Anemia in chronic kidney disease: Secondary | ICD-10-CM | POA: Diagnosis not present

## 2015-03-20 DIAGNOSIS — N2581 Secondary hyperparathyroidism of renal origin: Secondary | ICD-10-CM | POA: Diagnosis not present

## 2015-03-20 DIAGNOSIS — N186 End stage renal disease: Secondary | ICD-10-CM | POA: Diagnosis not present

## 2015-03-22 DIAGNOSIS — N186 End stage renal disease: Secondary | ICD-10-CM | POA: Diagnosis not present

## 2015-03-22 DIAGNOSIS — N2581 Secondary hyperparathyroidism of renal origin: Secondary | ICD-10-CM | POA: Diagnosis not present

## 2015-03-22 DIAGNOSIS — D631 Anemia in chronic kidney disease: Secondary | ICD-10-CM | POA: Diagnosis not present

## 2015-03-23 DIAGNOSIS — Q760 Spina bifida occulta: Secondary | ICD-10-CM | POA: Diagnosis not present

## 2015-03-23 DIAGNOSIS — Z992 Dependence on renal dialysis: Secondary | ICD-10-CM | POA: Diagnosis not present

## 2015-03-23 DIAGNOSIS — N189 Chronic kidney disease, unspecified: Secondary | ICD-10-CM | POA: Diagnosis not present

## 2015-03-24 DIAGNOSIS — N2581 Secondary hyperparathyroidism of renal origin: Secondary | ICD-10-CM | POA: Diagnosis not present

## 2015-03-24 DIAGNOSIS — N186 End stage renal disease: Secondary | ICD-10-CM | POA: Diagnosis not present

## 2015-03-24 DIAGNOSIS — D631 Anemia in chronic kidney disease: Secondary | ICD-10-CM | POA: Diagnosis not present

## 2015-03-27 DIAGNOSIS — N186 End stage renal disease: Secondary | ICD-10-CM | POA: Diagnosis not present

## 2015-03-27 DIAGNOSIS — N2581 Secondary hyperparathyroidism of renal origin: Secondary | ICD-10-CM | POA: Diagnosis not present

## 2015-03-27 DIAGNOSIS — D631 Anemia in chronic kidney disease: Secondary | ICD-10-CM | POA: Diagnosis not present

## 2015-03-28 DIAGNOSIS — Z992 Dependence on renal dialysis: Secondary | ICD-10-CM | POA: Diagnosis not present

## 2015-03-28 DIAGNOSIS — I120 Hypertensive chronic kidney disease with stage 5 chronic kidney disease or end stage renal disease: Secondary | ICD-10-CM | POA: Diagnosis not present

## 2015-03-28 DIAGNOSIS — N186 End stage renal disease: Secondary | ICD-10-CM | POA: Diagnosis not present

## 2015-03-29 DIAGNOSIS — N186 End stage renal disease: Secondary | ICD-10-CM | POA: Diagnosis not present

## 2015-03-29 DIAGNOSIS — D631 Anemia in chronic kidney disease: Secondary | ICD-10-CM | POA: Diagnosis not present

## 2015-03-29 DIAGNOSIS — N2581 Secondary hyperparathyroidism of renal origin: Secondary | ICD-10-CM | POA: Diagnosis not present

## 2015-03-31 DIAGNOSIS — N186 End stage renal disease: Secondary | ICD-10-CM | POA: Diagnosis not present

## 2015-03-31 DIAGNOSIS — D631 Anemia in chronic kidney disease: Secondary | ICD-10-CM | POA: Diagnosis not present

## 2015-03-31 DIAGNOSIS — N2581 Secondary hyperparathyroidism of renal origin: Secondary | ICD-10-CM | POA: Diagnosis not present

## 2015-04-03 DIAGNOSIS — D631 Anemia in chronic kidney disease: Secondary | ICD-10-CM | POA: Diagnosis not present

## 2015-04-03 DIAGNOSIS — N2581 Secondary hyperparathyroidism of renal origin: Secondary | ICD-10-CM | POA: Diagnosis not present

## 2015-04-03 DIAGNOSIS — N186 End stage renal disease: Secondary | ICD-10-CM | POA: Diagnosis not present

## 2015-04-07 DIAGNOSIS — N186 End stage renal disease: Secondary | ICD-10-CM | POA: Diagnosis not present

## 2015-04-07 DIAGNOSIS — N2581 Secondary hyperparathyroidism of renal origin: Secondary | ICD-10-CM | POA: Diagnosis not present

## 2015-04-07 DIAGNOSIS — D631 Anemia in chronic kidney disease: Secondary | ICD-10-CM | POA: Diagnosis not present

## 2015-04-10 DIAGNOSIS — N186 End stage renal disease: Secondary | ICD-10-CM | POA: Diagnosis not present

## 2015-04-10 DIAGNOSIS — N2581 Secondary hyperparathyroidism of renal origin: Secondary | ICD-10-CM | POA: Diagnosis not present

## 2015-04-10 DIAGNOSIS — D631 Anemia in chronic kidney disease: Secondary | ICD-10-CM | POA: Diagnosis not present

## 2015-04-12 DIAGNOSIS — N186 End stage renal disease: Secondary | ICD-10-CM | POA: Diagnosis not present

## 2015-04-12 DIAGNOSIS — D631 Anemia in chronic kidney disease: Secondary | ICD-10-CM | POA: Diagnosis not present

## 2015-04-12 DIAGNOSIS — N2581 Secondary hyperparathyroidism of renal origin: Secondary | ICD-10-CM | POA: Diagnosis not present

## 2015-04-14 DIAGNOSIS — N186 End stage renal disease: Secondary | ICD-10-CM | POA: Diagnosis not present

## 2015-04-14 DIAGNOSIS — D631 Anemia in chronic kidney disease: Secondary | ICD-10-CM | POA: Diagnosis not present

## 2015-04-14 DIAGNOSIS — N2581 Secondary hyperparathyroidism of renal origin: Secondary | ICD-10-CM | POA: Diagnosis not present

## 2015-04-17 DIAGNOSIS — N186 End stage renal disease: Secondary | ICD-10-CM | POA: Diagnosis not present

## 2015-04-17 DIAGNOSIS — D631 Anemia in chronic kidney disease: Secondary | ICD-10-CM | POA: Diagnosis not present

## 2015-04-17 DIAGNOSIS — N2581 Secondary hyperparathyroidism of renal origin: Secondary | ICD-10-CM | POA: Diagnosis not present

## 2015-04-19 DIAGNOSIS — D631 Anemia in chronic kidney disease: Secondary | ICD-10-CM | POA: Diagnosis not present

## 2015-04-19 DIAGNOSIS — N2581 Secondary hyperparathyroidism of renal origin: Secondary | ICD-10-CM | POA: Diagnosis not present

## 2015-04-19 DIAGNOSIS — N186 End stage renal disease: Secondary | ICD-10-CM | POA: Diagnosis not present

## 2015-04-21 DIAGNOSIS — D631 Anemia in chronic kidney disease: Secondary | ICD-10-CM | POA: Diagnosis not present

## 2015-04-21 DIAGNOSIS — N2581 Secondary hyperparathyroidism of renal origin: Secondary | ICD-10-CM | POA: Diagnosis not present

## 2015-04-21 DIAGNOSIS — N186 End stage renal disease: Secondary | ICD-10-CM | POA: Diagnosis not present

## 2015-04-24 DIAGNOSIS — N2581 Secondary hyperparathyroidism of renal origin: Secondary | ICD-10-CM | POA: Diagnosis not present

## 2015-04-24 DIAGNOSIS — N186 End stage renal disease: Secondary | ICD-10-CM | POA: Diagnosis not present

## 2015-04-24 DIAGNOSIS — D631 Anemia in chronic kidney disease: Secondary | ICD-10-CM | POA: Diagnosis not present

## 2015-04-26 DIAGNOSIS — N2581 Secondary hyperparathyroidism of renal origin: Secondary | ICD-10-CM | POA: Diagnosis not present

## 2015-04-26 DIAGNOSIS — D631 Anemia in chronic kidney disease: Secondary | ICD-10-CM | POA: Diagnosis not present

## 2015-04-26 DIAGNOSIS — N186 End stage renal disease: Secondary | ICD-10-CM | POA: Diagnosis not present

## 2015-04-27 DIAGNOSIS — N186 End stage renal disease: Secondary | ICD-10-CM | POA: Diagnosis not present

## 2015-04-27 DIAGNOSIS — Z992 Dependence on renal dialysis: Secondary | ICD-10-CM | POA: Diagnosis not present

## 2015-04-27 DIAGNOSIS — I120 Hypertensive chronic kidney disease with stage 5 chronic kidney disease or end stage renal disease: Secondary | ICD-10-CM | POA: Diagnosis not present

## 2015-04-28 DIAGNOSIS — N186 End stage renal disease: Secondary | ICD-10-CM | POA: Diagnosis not present

## 2015-04-28 DIAGNOSIS — D631 Anemia in chronic kidney disease: Secondary | ICD-10-CM | POA: Diagnosis not present

## 2015-04-28 DIAGNOSIS — N2581 Secondary hyperparathyroidism of renal origin: Secondary | ICD-10-CM | POA: Diagnosis not present

## 2015-04-30 DIAGNOSIS — E782 Mixed hyperlipidemia: Secondary | ICD-10-CM | POA: Diagnosis not present

## 2015-04-30 DIAGNOSIS — E0800 Diabetes mellitus due to underlying condition with hyperosmolarity without nonketotic hyperglycemic-hyperosmolar coma (NKHHC): Secondary | ICD-10-CM | POA: Diagnosis not present

## 2015-04-30 DIAGNOSIS — M1631 Unilateral osteoarthritis resulting from hip dysplasia, right hip: Secondary | ICD-10-CM | POA: Diagnosis not present

## 2015-05-01 DIAGNOSIS — D631 Anemia in chronic kidney disease: Secondary | ICD-10-CM | POA: Diagnosis not present

## 2015-05-01 DIAGNOSIS — N186 End stage renal disease: Secondary | ICD-10-CM | POA: Diagnosis not present

## 2015-05-01 DIAGNOSIS — N2581 Secondary hyperparathyroidism of renal origin: Secondary | ICD-10-CM | POA: Diagnosis not present

## 2015-05-03 DIAGNOSIS — N2581 Secondary hyperparathyroidism of renal origin: Secondary | ICD-10-CM | POA: Diagnosis not present

## 2015-05-03 DIAGNOSIS — D631 Anemia in chronic kidney disease: Secondary | ICD-10-CM | POA: Diagnosis not present

## 2015-05-03 DIAGNOSIS — N186 End stage renal disease: Secondary | ICD-10-CM | POA: Diagnosis not present

## 2015-05-05 DIAGNOSIS — N2581 Secondary hyperparathyroidism of renal origin: Secondary | ICD-10-CM | POA: Diagnosis not present

## 2015-05-05 DIAGNOSIS — D631 Anemia in chronic kidney disease: Secondary | ICD-10-CM | POA: Diagnosis not present

## 2015-05-05 DIAGNOSIS — N186 End stage renal disease: Secondary | ICD-10-CM | POA: Diagnosis not present

## 2015-05-08 DIAGNOSIS — N186 End stage renal disease: Secondary | ICD-10-CM | POA: Diagnosis not present

## 2015-05-08 DIAGNOSIS — D631 Anemia in chronic kidney disease: Secondary | ICD-10-CM | POA: Diagnosis not present

## 2015-05-08 DIAGNOSIS — N2581 Secondary hyperparathyroidism of renal origin: Secondary | ICD-10-CM | POA: Diagnosis not present

## 2015-05-10 ENCOUNTER — Encounter (HOSPITAL_COMMUNITY): Payer: Self-pay

## 2015-05-10 ENCOUNTER — Observation Stay (HOSPITAL_COMMUNITY)
Admission: EM | Admit: 2015-05-10 | Discharge: 2015-05-14 | Disposition: A | Discharge status: Home / Self Care | Payer: Medicare Other | Attending: Family Medicine | Admitting: Family Medicine

## 2015-05-10 DIAGNOSIS — D631 Anemia in chronic kidney disease: Secondary | ICD-10-CM | POA: Diagnosis not present

## 2015-05-10 DIAGNOSIS — N186 End stage renal disease: Secondary | ICD-10-CM | POA: Diagnosis not present

## 2015-05-10 DIAGNOSIS — T50902A Poisoning by unspecified drugs, medicaments and biological substances, intentional self-harm, initial encounter: Secondary | ICD-10-CM

## 2015-05-10 DIAGNOSIS — F329 Major depressive disorder, single episode, unspecified: Secondary | ICD-10-CM | POA: Diagnosis not present

## 2015-05-10 DIAGNOSIS — X58XXXA Exposure to other specified factors, initial encounter: Secondary | ICD-10-CM | POA: Insufficient documentation

## 2015-05-10 DIAGNOSIS — Z792 Long term (current) use of antibiotics: Secondary | ICD-10-CM | POA: Diagnosis not present

## 2015-05-10 DIAGNOSIS — Z91040 Latex allergy status: Secondary | ICD-10-CM | POA: Insufficient documentation

## 2015-05-10 DIAGNOSIS — Z79899 Other long term (current) drug therapy: Secondary | ICD-10-CM | POA: Insufficient documentation

## 2015-05-10 DIAGNOSIS — T1491 Suicide attempt: Secondary | ICD-10-CM | POA: Diagnosis not present

## 2015-05-10 DIAGNOSIS — N2581 Secondary hyperparathyroidism of renal origin: Secondary | ICD-10-CM | POA: Diagnosis not present

## 2015-05-10 DIAGNOSIS — Z992 Dependence on renal dialysis: Secondary | ICD-10-CM | POA: Insufficient documentation

## 2015-05-10 DIAGNOSIS — Y92002 Bathroom of unspecified non-institutional (private) residence single-family (private) house as the place of occurrence of the external cause: Secondary | ICD-10-CM | POA: Diagnosis not present

## 2015-05-10 DIAGNOSIS — Y9389 Activity, other specified: Secondary | ICD-10-CM | POA: Insufficient documentation

## 2015-05-10 DIAGNOSIS — I120 Hypertensive chronic kidney disease with stage 5 chronic kidney disease or end stage renal disease: Secondary | ICD-10-CM | POA: Diagnosis not present

## 2015-05-10 DIAGNOSIS — Q059 Spina bifida, unspecified: Secondary | ICD-10-CM | POA: Insufficient documentation

## 2015-05-10 DIAGNOSIS — N390 Urinary tract infection, site not specified: Secondary | ICD-10-CM | POA: Insufficient documentation

## 2015-05-10 DIAGNOSIS — R000 Tachycardia, unspecified: Secondary | ICD-10-CM | POA: Insufficient documentation

## 2015-05-10 DIAGNOSIS — T43012A Poisoning by tricyclic antidepressants, intentional self-harm, initial encounter: Secondary | ICD-10-CM | POA: Diagnosis not present

## 2015-05-10 DIAGNOSIS — Y998 Other external cause status: Secondary | ICD-10-CM | POA: Insufficient documentation

## 2015-05-10 DIAGNOSIS — T43011A Poisoning by tricyclic antidepressants, accidental (unintentional), initial encounter: Secondary | ICD-10-CM | POA: Diagnosis not present

## 2015-05-10 LAB — CBC WITH DIFFERENTIAL/PLATELET
BASOS ABS: 0 10*3/uL (ref 0.0–0.1)
BASOS PCT: 0 %
EOS ABS: 0.6 10*3/uL (ref 0.0–0.7)
EOS PCT: 9 %
HCT: 33.2 % — ABNORMAL LOW (ref 36.0–46.0)
Hemoglobin: 10.5 g/dL — ABNORMAL LOW (ref 12.0–15.0)
Lymphocytes Relative: 24 %
Lymphs Abs: 1.5 10*3/uL (ref 0.7–4.0)
MCH: 30.5 pg (ref 26.0–34.0)
MCHC: 31.6 g/dL (ref 30.0–36.0)
MCV: 96.5 fL (ref 78.0–100.0)
MONO ABS: 0.7 10*3/uL (ref 0.1–1.0)
MONOS PCT: 11 %
Neutro Abs: 3.6 10*3/uL (ref 1.7–7.7)
Neutrophils Relative %: 56 %
PLATELETS: 163 10*3/uL (ref 150–400)
RBC: 3.44 MIL/uL — ABNORMAL LOW (ref 3.87–5.11)
RDW: 15.2 % (ref 11.5–15.5)
WBC: 6.3 10*3/uL (ref 4.0–10.5)

## 2015-05-10 MED ORDER — ONDANSETRON HCL 4 MG PO TABS
4.0000 mg | ORAL_TABLET | Freq: Three times a day (TID) | ORAL | Status: DC | PRN
  Administered 2015-05-11 – 2015-05-14 (×2): 4 mg via ORAL
  Filled 2015-05-10 (×2): qty 1

## 2015-05-10 MED ORDER — LORAZEPAM 1 MG PO TABS: 1.0000 mg | ORAL_TABLET | Freq: Three times a day (TID) | ORAL | Status: DC | PRN

## 2015-05-10 NOTE — ED Notes (Signed)
[place] [facility]) recommends: Monitor EKG to make sure QRS < 0.14 -bicarb bolus if greater. Tylenol Level  LFT's And if patient has  seizures or combative can give benzo's

## 2015-05-10 NOTE — ED Notes (Signed)
Patient brought to ED by cousin who found patient in the floor unresponsive. Upon arrival to ED patient was responsive to verbal stimuli. Patient states she took an unknown amount of Amitriptyline 25mg  at an unknown time. Patient voiced that she was intentionally trying to kill herself. And states she has had these thoughts "for a while now" and that this was the first time she has done anything like this.

## 2015-05-10 NOTE — BH Assessment (Signed)
[facility] Assessment Progress Note   Clinician attempted to assess patient.  The monitor on [facility] side was not transmitting video, only audio.  John, Network engineer, said that patient was very sleepy and was not responding to them being in the room.  Clinician asked if Dr. Dayna Barker could withdraw the TTS order until the patient is more alert and oriented.  John said that he would ask.

## 2015-05-10 NOTE — ED Notes (Signed)
TTS at this time. 

## 2015-05-10 NOTE — ED Provider Notes (Signed)
CSN: [id number]     Arrival date & time 05/10/15  2237 History  By signing my name below, I, [name], attest that this documentation has been prepared under the direction and in the presence of Jeanne Pew, MD. Electronically Signed: Helane Haynes, ED Scribe. 05/10/2015. 11:10 PM.    Chief Complaint  Patient presents with  . Drug Overdose   The history is provided by the patient and a relative. No language interpreter was used.   HPI Comments: [name] is a 24 y.o. female who presents to the Emergency Department complaining of a drug overdose that occurred just PTA. Per cousin, pt was found unresponsive on the bathroom floor with a pill bottle of 25 mg Amitriptyline on the floor next to her. Cousin notes pt did not tell her how many pills she took, and that the pt wrote an apologetic post on facebook prior to the incident. Per relatives this has not happened before. They also deny a PMHx of SI. Per aunt, pt moved recently. She states the pt has a PMHx of spina bifida and HTN. Per aunt, pt had dialysis today and has her next appointment in two days. Pt states she is taking Renvela. Aunt notes pt also has a sore on her lip.  Past Medical History  Diagnosis Date  . Spina bifida   . UTI (lower urinary tract infection)   . HTN (hypertension) 05/02/2011  . Anemia associated with chronic renal failure   . 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  . Blood transfusion   . Dialysis care   . ESRD (end stage renal disease) on dialysis [facility])    Past Surgical History  Procedure Laterality Date  . Av fistula placement      left arm   Family History  Problem Relation Age of Onset  . Kidney cancer Other    Social History  Substance Use Topics  . Smoking status: Never Smoker   . Smokeless tobacco: None  . Alcohol Use: No   OB History    No data available     Review of Systems  Constitutional: Negative for fever.  Skin: Negative for wound.   Psychiatric/Behavioral: Positive for suicidal ideas.    Allergies  Ciprofloxacin; Other; Peanut-containing drug products; Aleve; Influenza vaccines; Tetanus toxoids; and Latex  Home Medications   Prior to Admission medications   Medication Sig Start Date End Date Taking? Authorizing Provider  acetaminophen (TYLENOL) 500 MG tablet Take 1,000 mg by mouth every 6 (six) hours as needed for moderate pain or headache.    Historical Provider, MD  ALPRAZolam Duanne Moron) 0.25 MG tablet Take 1 tablet by mouth daily as needed. Before Dialysis and as needed. 11/09/14   Historical Provider, MD  cinacalcet (SENSIPAR) 30 MG tablet Take 30 mg by mouth daily.    Historical Provider, MD  ciprofloxacin (CIPRO) 500 MG tablet Take 1 tablet (500 mg total) by mouth 2 (two) times daily. 12/05/14   Robbie Lis, MD  Epoetin Alfa (EPOGEN IJ) Inject as directed. Pt gets on dialysis days which are Tuesday, Thursday, and Saturday.    Historical Provider, MD  ibuprofen (ADVIL,MOTRIN) 200 MG tablet Take 200 mg by mouth every 6 (six) hours as needed for headache or moderate pain.    Historical Provider, MD  levofloxacin (LEVAQUIN) 250 MG tablet Take 1 tablet (250 mg total) by mouth every other day. 12/07/14   Veryl Speak, MD  multivitamin (RENA-VIT) TABS tablet Take 1 tablet by  mouth daily.    Historical Provider, MD  ondansetron (ZOFRAN ODT) 8 MG disintegrating tablet 8mg  ODT q4 hours prn nausea 12/07/14   Veryl Speak, MD  sevelamer carbonate (RENVELA) 800 MG tablet Take 800 mg by mouth 3 (three) times daily with meals.     Historical Provider, MD  Vitamin D, Ergocalciferol, (DRISDOL) 50000 UNITS CAPS capsule Take 50,000 Units by mouth every 7 (seven) days. Takes on Thursday    Historical Provider, MD   BP 139/93 mmHg  Pulse 115  Temp(Src) 98.6 F (37 C) (Oral)  Resp 16  Wt 55 lb (24.948 kg)  SpO2 100% Physical Exam  Constitutional: She is oriented to person, place, and time. She appears well-developed and  well-nourished.  HENT:  Head: Normocephalic.  Eyes: EOM are normal.  Neck: Normal range of motion.  Cardiovascular: Regular rhythm.  Exam reveals no gallop and no friction rub.   No murmur heard. tachycardic  Pulmonary/Chest: Effort normal. She has no wheezes. She has no rales.  Abdominal: She exhibits no distension.  Musculoskeletal: Normal range of motion.  Neurological: She is alert and oriented to person, place, and time.  Psychiatric: She has a normal mood and affect.  Nursing note and vitals reviewed.   ED Course  Procedures  DIAGNOSTIC STUDIES: Oxygen Saturation is 100% on RA, normal by my interpretation.    COORDINATION OF CARE: 11:07 PM - Discussed plans to order diagnostic studies and observe. Pt advised of plan for treatment and pt agrees.  Labs Review Labs Reviewed  COMPREHENSIVE METABOLIC PANEL - Abnormal; Notable for the following:    Sodium 131 (*)    Potassium 3.1 (*)    Chloride 95 (*)    Glucose, Bld 108 (*)    Creatinine, Ser 3.46 (*)    Calcium 8.5 (*)    ALT 13 (*)    GFR calc non Af Amer 18 (*)    GFR calc Af Amer 21 (*)    All other components within normal limits  CBC WITH DIFFERENTIAL/PLATELET - Abnormal; Notable for the following:    RBC 3.44 (*)    Hemoglobin 10.5 (*)    HCT 33.2 (*)    All other components within normal limits  ACETAMINOPHEN LEVEL - Abnormal; Notable for the following:    Acetaminophen (Tylenol), Serum <10 (*)    All other components within normal limits  URINALYSIS, ROUTINE W REFLEX MICROSCOPIC (NOT AT [facility]) - Abnormal; Notable for the following:    APPearance CLOUDY (*)    pH 8.5 (*)    Hgb urine dipstick SMALL (*)    Protein, ur 100 (*)    Leukocytes, UA MODERATE (*)    All other components within normal limits  ETHANOL  URINE RAPID DRUG SCREEN, HOSP PERFORMED  SALICYLATE LEVEL  URINE MICROSCOPIC-ADD ON    Imaging Review No results found. I have personally reviewed and evaluated these images and lab results as  part of my medical decision-making.   EKG Interpretation   Date/Time:  Thursday May 10 2015 22:47:24 EDT Ventricular Rate:  120 PR Interval:  126 QRS Duration: 76 QT Interval:  321 QTC Calculation: 453 R Axis:   58 Text Interpretation:  Sinus tachycardia Confirmed by [facility] MD, Corene Cornea  [phone]) on 05/10/2015 10:56:55 PM      MDM   Final diagnoses:  Suicidal overdose, initial encounter ([place])   24 year old female intentionally overdose on TCAs. Unsure how much however on her initial evaluation she is only tachycardic with no QRS widening. Initial  sodium was slightly low other than that her labs are normal. Patient was observed for approximately 4 hours heart rate improved without intervention QRS is not widened mental status was similar. Feel like she is medically cleared from a TCA overdose standpoint. TTS consult in for evaluation and we will disposition based on the results.  TTS without good interview 2/2 patient sleepiness. Reevaluated patient and she was sleepy but awoke easily, answered questions grudgingly. Told nurse she should be able to participate in interview.   I personally performed the services described in this documentation, which was scribed in my presence. The recorded information has been reviewed and is accurate.    Jeanne Pew, MD 05/11/15 2124

## 2015-05-11 DIAGNOSIS — T43011A Poisoning by tricyclic antidepressants, accidental (unintentional), initial encounter: Secondary | ICD-10-CM | POA: Diagnosis not present

## 2015-05-11 LAB — RAPID URINE DRUG SCREEN, HOSP PERFORMED
Amphetamines: NOT DETECTED
Barbiturates: NOT DETECTED
Benzodiazepines: NOT DETECTED
COCAINE: NOT DETECTED
OPIATES: NOT DETECTED
TETRAHYDROCANNABINOL: NOT DETECTED

## 2015-05-11 LAB — URINE MICROSCOPIC-ADD ON

## 2015-05-11 LAB — COMPREHENSIVE METABOLIC PANEL
ALBUMIN: 3.7 g/dL (ref 3.5–5.0)
ALK PHOS: 108 U/L (ref 38–126)
ALT: 13 U/L — AB (ref 14–54)
ANION GAP: 8 (ref 5–15)
AST: 21 U/L (ref 15–41)
BUN: 17 mg/dL (ref 6–20)
CALCIUM: 8.5 mg/dL — AB (ref 8.9–10.3)
CHLORIDE: 95 mmol/L — AB (ref 101–111)
CO2: 28 mmol/L (ref 22–32)
Creatinine, Ser: 3.46 mg/dL — ABNORMAL HIGH (ref 0.44–1.00)
GFR, EST AFRICAN AMERICAN: 21 mL/min — AB (ref >60)
GFR, EST NON AFRICAN AMERICAN: 18 mL/min — AB (ref >60)
Glucose, Bld: 108 mg/dL — ABNORMAL HIGH (ref 65–99)
Potassium: 3.1 mmol/L — ABNORMAL LOW (ref 3.5–5.1)
SODIUM: 131 mmol/L — AB (ref 135–145)
Total Bilirubin: 0.5 mg/dL (ref 0.3–1.2)
Total Protein: 7.2 g/dL (ref 6.5–8.1)

## 2015-05-11 LAB — URINALYSIS, ROUTINE W REFLEX MICROSCOPIC
Bilirubin Urine: NEGATIVE
GLUCOSE, UA: NEGATIVE mg/dL
KETONES UR: NEGATIVE mg/dL
NITRITE: NEGATIVE
PROTEIN: 100 mg/dL — AB
Specific Gravity, Urine: 1.01 (ref 1.005–1.030)
Urobilinogen, UA: 0.2 mg/dL (ref 0.0–1.0)
pH: 8.5 — ABNORMAL HIGH (ref 5.0–8.0)

## 2015-05-11 LAB — SALICYLATE LEVEL: Salicylate Lvl: <4 mg/dL (ref 2.8–30.0)

## 2015-05-11 LAB — ACETAMINOPHEN LEVEL

## 2015-05-11 LAB — ETHANOL

## 2015-05-11 MED ORDER — VITAMIN D (ERGOCALCIFEROL) 1.25 MG (50000 UNIT) PO CAPS: 50000.0000 [IU] | ORAL_CAPSULE | ORAL | Status: DC

## 2015-05-11 MED ORDER — ACETAMINOPHEN 500 MG PO TABS
1000.0000 mg | ORAL_TABLET | Freq: Four times a day (QID) | ORAL | Status: DC | PRN
  Administered 2015-05-12: 1000 mg via ORAL
  Filled 2015-05-11: qty 2

## 2015-05-11 MED ORDER — RENA-VITE PO TABS
1.0000 | ORAL_TABLET | Freq: Every day | ORAL | Status: DC
  Administered 2015-05-11 – 2015-05-14 (×3): 1 via ORAL
  Filled 2015-05-11 (×7): qty 1

## 2015-05-11 MED ORDER — SEVELAMER CARBONATE 800 MG PO TABS
800.0000 mg | ORAL_TABLET | Freq: Three times a day (TID) | ORAL | Status: DC
  Administered 2015-05-13 – 2015-05-14 (×6): 800 mg via ORAL
  Filled 2015-05-11 (×17): qty 1

## 2015-05-11 MED ORDER — CINACALCET HCL 30 MG PO TABS
30.0000 mg | ORAL_TABLET | Freq: Every day | ORAL | Status: DC
  Administered 2015-05-14: 30 mg via ORAL
  Filled 2015-05-11 (×7): qty 1

## 2015-05-11 NOTE — ED Notes (Signed)
TTS called to inquire about patients mental status for an evaluation. I woke patient up who stated she felt "the meds haven't wore off yet". [facility] informed. Consult order discontinued, to be reordered once patient is cognitively alert enough for assessment.

## 2015-05-11 NOTE — ED Provider Notes (Signed)
Recheck at 1530:  Patient is alert. Dialysis days are Tuesday Thursday Saturday. Nurse will initiate TSS consult.  [name], MD 05/11/15 [phone]

## 2015-05-11 NOTE — ED Provider Notes (Addendum)
Recheck pt - resting comfortably, no distress. Pulse ox 99% room air.  Pt is awaiting psych team disposition.  Of note, pt is dialysis patient, w tomorrow being pts normal HD day - charge rn informed.  Discussed with oncoming physician, Dr Christy Gentles,  that if patient remains boarding in the ED for psychiatry disposition, that patient will need to have dialysis tomorrow.      Lajean Saver, MD 05/11/15 [phone]

## 2015-05-11 NOTE — BH Assessment (Addendum)
Tele Assessment Note   [name] is an 24 y.o. female who came to ED after a cousin found her on the bathroom floor unresponsive after an overdose, with an empty pill bottle of 25 mg Amitriptyline on the floor.  Neither pt nor cousin knows how many pills pt took.  Per MD note, pt posted an apology on Facebook. Pt states, "I am just tired of fighting--I didn't ask to be in a wheelchair, and it's not fair. All anyone sees is my wheelchair, and not me. I am trying to get a job, and I can't because people don't think I can do things". Pt states that her job/financial situation is stressful,in addition to her living situation. She currently lives with her aunt, but they are looking for a place and in the mean time have to stay with her GM. "There are a lot of people who live there, and I am the only one she asks to help pay, and I don't have a job". Pt states she has never had Ip or OP therapy, but takes pills for sleep (the ones she took in her overdose). Pt states she had another OD attempt 2 years ago, but no one knew because she just threw them up. Pt states she has a HS education and gets disability.  During interview, pt was calm, slightly groggy, and cooperative. She was dressed in a hospital gown, and had disheveled appearance. Pt admits to current SI, denies HI, AVH. Her speech was soft, but normal thought processes, normal movement. There is no evidence of pt responding to internal stimuli. Pt had depressed affect and affect was congruent with mood.  Samuel Jester, NP, recommends IP treatment. TTS to seek placement.  Diagnosis: Depressive disorder NOS   Past Medical History:  Past Medical History  Diagnosis Date  . Spina bifida   . UTI (lower urinary tract infection)   . HTN (hypertension) 05/02/2011  . Anemia associated with chronic renal failure   . 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  . Blood transfusion   . Dialysis care   . ESRD (end stage renal disease) on  dialysis [facility])     Past Surgical History  Procedure Laterality Date  . Av fistula placement      left arm    Family History:  Family History  Problem Relation Age of Onset  . Kidney cancer Other     Social History:  reports that she has never smoked. She does not have any smokeless tobacco history on file. She reports that she does not drink alcohol or use illicit drugs.  Additional Social History:  Alcohol / Drug Use Pain Medications: denies Prescriptions: denies Over the Counter: denies History of alcohol / drug use?: No history of alcohol / drug abuse Longest period of sobriety (when/how long): denies Negative Consequences of Use:  (denies) Withdrawal Symptoms:  (denies)  CIWA: CIWA-Ar BP: 104/62 mmHg Pulse Rate: 87 COWS:    PATIENT STRENGTHS: (choose at least two) Ability for insight Average or above average intelligence Capable of independent living Communication skills Supportive family/friends  Allergies:  Allergies  Allergen Reactions  . Ciprofloxacin Shortness Of Breath, Nausea And Vomiting and Other (See Comments)    HIGH FEVER  . Other Anaphylaxis    Revaclear dialzer  . Peanut-Containing Drug Products Anaphylaxis  . Aleve [Naproxen Sodium] Other (See Comments)    G.I.Bleed  . Influenza Vaccines Nausea And Vomiting    High fever  . Tetanus Toxoids  Nausea And Vomiting and Other (See Comments)    HIGH FEVER  . Latex Itching and Rash    Home Medications:  (Not in a hospital admission)  OB/GYN Status:  No LMP recorded. Patient is not currently having periods (Reason: Other).  General Assessment Data Location of Assessment: [facility] TTS Assessment: In system Is this a Tele or Face-to-Face Assessment?: Tele Assessment Is this an Initial Assessment or a Re-assessment for this encounter?: Initial Assessment Is patient pregnant?: No Pregnancy Status: No Living Arrangements:  (aunt) Can pt return to current living arrangement?: Yes Admission Status:  Voluntary Is patient capable of signing voluntary admission?: Yes Referral Source: Self/Family/Friend Insurance type: [employer]     Crisis Care Plan Living Arrangements:  (aunt) Name of Psychiatrist: none Name of Therapist: none  Education Status Is patient currently in school?: No Highest grade of school patient has completed: HS  Risk to self with the past 6 months Suicidal Ideation: Yes-Currently Present Has patient been a risk to self within the past 6 months prior to admission? : Yes Suicidal Intent: Yes-Currently Present Has patient had any suicidal intent within the past 6 months prior to admission? : Yes Is patient at risk for suicide?: Yes Suicidal Plan?: Yes-Currently Present Has patient had any suicidal plan within the past 6 months prior to admission? :  (OD) Specify Current Suicidal Plan: OD Access to Means: Yes Specify Access to Suicidal Means: environment What has been your use of drugs/alcohol within the last 12 months?: none Previous Attempts/Gestures: Yes How many times?: 1 Other Self Harm Risks: none known Triggers for Past Attempts:  (separation from om) Intentional Self Injurious Behavior: None Family Suicide History: Yes (cousin) Recent stressful life event(s):  (job loss, living situation) Persecutory voices/beliefs?: No Depression: Yes Depression Symptoms: Despondent, Insomnia, Fatigue, Loss of interest in usual pleasures, Feeling worthless/self pity, Isolating Substance abuse history and/or treatment for substance abuse?: No Suicide prevention information given to non-admitted patients: Yes  Risk to Others within the past 6 months Homicidal Ideation: No Does patient have any lifetime risk of violence toward others beyond the six months prior to admission? : No Thoughts of Harm to Others: No Current Homicidal Intent: No Current Homicidal Plan: No Access to Homicidal Means: No History of harm to others?: No Assessment of Violence: None Noted Does  patient have access to weapons?: No Criminal Charges Pending?: No Does patient have a court date: No Is patient on probation?: No  Psychosis Hallucinations: None noted Delusions: None noted  Mental Status Report Appearance/Hygiene: In hospital gown Eye Contact: Good Motor Activity: Unremarkable Speech: Logical/coherent Level of Consciousness: Alert Mood: Depressed, Sad Affect: Depressed, Sad Anxiety Level: Moderate Thought Processes: Coherent, Relevant Judgement: Impaired Orientation: Person, Place, Time, Situation, Appropriate for developmental age Obsessive Compulsive Thoughts/Behaviors: None  Cognitive Functioning Concentration: Poor Memory: Recent Intact, Remote Intact IQ: Average Insight: Fair Impulse Control: Fair Appetite: Poor Weight Loss: 0 Weight Gain: 0 Sleep: Decreased Total Hours of Sleep: 6 Vegetative Symptoms: None  ADLScreening [facility] Assessment Services) Patient's cognitive ability adequate to safely complete daily activities?: Yes Patient able to express need for assistance with ADLs?: Yes Independently performs ADLs?: Yes (appropriate for developmental age)  Prior Inpatient Therapy Prior Inpatient Therapy: No  Prior Outpatient Therapy Prior Outpatient Therapy: No  ADL Screening (condition at time of admission) Patient's cognitive ability adequate to safely complete daily activities?: Yes Is the patient deaf or have difficulty hearing?: No Does the patient have difficulty seeing, even when wearing glasses/contacts?: No Does the  patient have difficulty concentrating, remembering, or making decisions?: No Patient able to express need for assistance with ADLs?: Yes Does the patient have difficulty dressing or bathing?: Yes Independently performs ADLs?: Yes (appropriate for developmental age) Does the patient have difficulty walking or climbing stairs?: Yes Weakness of Legs: Both Weakness of Arms/Hands: None  Home Assistive  Devices/Equipment Home Assistive Devices/Equipment: Wheelchair    Abuse/Neglect Assessment (Assessment to be complete while patient is alone) Physical Abuse: Denies Verbal Abuse: Denies Sexual Abuse: Denies Exploitation of patient/patient's resources: Denies Self-Neglect: Denies Values / Beliefs Cultural Requests During Hospitalization: None Spiritual Requests During Hospitalization: None   Advance Directives (For Healthcare) Does patient have an advance directive?: No Would patient like information on creating an advanced directive?: No - patient declined information    Additional Information 1:1 In Past 12 Months?: No CIRT Risk: No Elopement Risk: No Does patient have medical clearance?: Yes     Disposition:  Disposition Initial Assessment Completed for this Encounter: Yes Disposition of Patient: Inpatient treatment program Type of inpatient treatment program: Adult  [facility] 05/11/2015 4:11 PM

## 2015-05-11 NOTE — Progress Notes (Addendum)
Patient was referred at: [facility] - under review. [facility] - left voicemail. [facility] - left voicemail. [facility] per Pamala Hurry, d/c tomorrow 11/15, fax referral.  CSW will continue to seek placement.  Verlon Setting, [place] Disposition staff 05/11/2015 6:45 PM

## 2015-05-12 DIAGNOSIS — T50902A Poisoning by unspecified drugs, medicaments and biological substances, intentional self-harm, initial encounter: Secondary | ICD-10-CM

## 2015-05-12 DIAGNOSIS — T1491 Suicide attempt: Secondary | ICD-10-CM

## 2015-05-12 DIAGNOSIS — D631 Anemia in chronic kidney disease: Secondary | ICD-10-CM | POA: Diagnosis not present

## 2015-05-12 DIAGNOSIS — N186 End stage renal disease: Secondary | ICD-10-CM | POA: Diagnosis not present

## 2015-05-12 DIAGNOSIS — E871 Hypo-osmolality and hyponatremia: Secondary | ICD-10-CM | POA: Diagnosis not present

## 2015-05-12 DIAGNOSIS — T43011A Poisoning by tricyclic antidepressants, accidental (unintentional), initial encounter: Secondary | ICD-10-CM | POA: Diagnosis not present

## 2015-05-12 DIAGNOSIS — Z992 Dependence on renal dialysis: Secondary | ICD-10-CM | POA: Diagnosis not present

## 2015-05-12 DIAGNOSIS — I10 Essential (primary) hypertension: Secondary | ICD-10-CM | POA: Diagnosis not present

## 2015-05-12 LAB — RENAL FUNCTION PANEL
Albumin: 3.5 g/dL (ref 3.5–5.0)
Anion gap: 10 (ref 5–15)
BUN: 31 mg/dL — AB (ref 6–20)
CHLORIDE: 103 mmol/L (ref 101–111)
CO2: 22 mmol/L (ref 22–32)
CREATININE: 5.6 mg/dL — AB (ref 0.44–1.00)
Calcium: 8.6 mg/dL — ABNORMAL LOW (ref 8.9–10.3)
GFR calc Af Amer: 11 mL/min — ABNORMAL LOW (ref >60)
GFR, EST NON AFRICAN AMERICAN: 10 mL/min — AB (ref >60)
Glucose, Bld: 78 mg/dL (ref 65–99)
Phosphorus: 4.2 mg/dL (ref 2.5–4.6)
Potassium: 3.8 mmol/L (ref 3.5–5.1)
Sodium: 135 mmol/L (ref 135–145)

## 2015-05-12 LAB — CBC
HCT: 31.1 % — ABNORMAL LOW (ref 36.0–46.0)
Hemoglobin: 9.9 g/dL — ABNORMAL LOW (ref 12.0–15.0)
MCH: 30.6 pg (ref 26.0–34.0)
MCHC: 31.8 g/dL (ref 30.0–36.0)
MCV: 96 fL (ref 78.0–100.0)
Platelets: 155 10*3/uL (ref 150–400)
RBC: 3.24 MIL/uL — ABNORMAL LOW (ref 3.87–5.11)
RDW: 15.2 % (ref 11.5–15.5)
WBC: 4.7 10*3/uL (ref 4.0–10.5)

## 2015-05-12 MED ORDER — LIDOCAINE-PRILOCAINE 2.5-2.5 % EX CREA: 1.0000 "application " | TOPICAL_CREAM | CUTANEOUS | Status: DC | PRN

## 2015-05-12 MED ORDER — SODIUM CHLORIDE 0.9 % IV SOLN: 100.0000 mL | INTRAVENOUS | Status: DC | PRN

## 2015-05-12 MED ORDER — HEPARIN SODIUM (PORCINE) 1000 UNIT/ML DIALYSIS
1000.0000 [IU] | INTRAMUSCULAR | Status: DC | PRN
  Filled 2015-05-12: qty 1

## 2015-05-12 MED ORDER — ALPRAZOLAM 0.25 MG PO TABS
0.2500 mg | ORAL_TABLET | Freq: Every day | ORAL | Status: DC | PRN
  Filled 2015-05-12: qty 1

## 2015-05-12 MED ORDER — PENTAFLUOROPROP-TETRAFLUOROETH EX AERO: 1.0000 "application " | INHALATION_SPRAY | CUTANEOUS | Status: DC | PRN

## 2015-05-12 MED ORDER — ALTEPLASE 2 MG IJ SOLR
2.0000 mg | Freq: Once | INTRAMUSCULAR | Status: DC | PRN
  Filled 2015-05-12: qty 2

## 2015-05-12 MED ORDER — LIDOCAINE HCL (PF) 1 % IJ SOLN: 5.0000 mL | INTRAMUSCULAR | Status: DC | PRN

## 2015-05-12 NOTE — BHH Counselor (Signed)
Pending review for possible placement with [facility].  At his time, there is no appropriate bed for this patient, due to; the acuity of the unit and patient to staff ratio. Writer informed [place] [facility] Otila Kluver) of the bed status.

## 2015-05-12 NOTE — Consult Note (Signed)
Reason for Consult: End-stage renal disease Referring Physician: Dr. Alvester Morin is an 24 y.o. female.  HPI: She is a patient was history of spinal bifida, hypertension, end-stage renal disease on maintenance hemodialysis presently was brought from home because of altered mental status and history of amitriptyline overdose. According to the patient she had her dialysis on Thursday and then Thursday night she was brought after she took an unknown dose of amitriptyline. Presently patient is alert and in no apparent distress. Consult is called for regular dialysis.  Past Medical History  Diagnosis Date  . Spina bifida   . UTI (lower urinary tract infection)   . HTN (hypertension) 05/02/2011  . Anemia associated with chronic renal failure   . 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  . Blood transfusion   . Dialysis care   . ESRD (end stage renal disease) on dialysis [facility])     Past Surgical History  Procedure Laterality Date  . Av fistula placement      left arm    Family History  Problem Relation Age of Onset  . Kidney cancer Other     Social History:  reports that she has never smoked. She does not have any smokeless tobacco history on file. She reports that she does not drink alcohol or use illicit drugs.  Allergies:  Allergies  Allergen Reactions  . Ciprofloxacin Shortness Of Breath, Nausea And Vomiting and Other (See Comments)    HIGH FEVER  . Other Anaphylaxis    Revaclear dialzer  . Peanut-Containing Drug Products Anaphylaxis  . Aleve [Naproxen Sodium] Other (See Comments)    G.I.Bleed  . Influenza Vaccines Nausea And Vomiting    High fever  . Tetanus Toxoids Nausea And Vomiting and Other (See Comments)    HIGH FEVER  . Latex Itching and Rash    Medications: I have reviewed the patient's current medications.  Results for orders placed or performed during the hospital encounter of 05/10/15 (from the past 48 hour(s))  Comprehensive  metabolic panel     Status: Abnormal   Collection Time: 05/10/15 11:19 PM  Result Value Ref Range   Sodium 131 (L) 135 - 145 mmol/L   Potassium 3.1 (L) 3.5 - 5.1 mmol/L   Chloride 95 (L) 101 - 111 mmol/L   CO2 28 22 - 32 mmol/L   Glucose, Bld 108 (H) 65 - 99 mg/dL   BUN 17 6 - 20 mg/dL   Creatinine, Ser 3.46 (H) 0.44 - 1.00 mg/dL   Calcium 8.5 (L) 8.9 - 10.3 mg/dL   Total Protein 7.2 6.5 - 8.1 g/dL   Albumin 3.7 3.5 - 5.0 g/dL   AST 21 15 - 41 U/L   ALT 13 (L) 14 - 54 U/L   Alkaline Phosphatase 108 38 - 126 U/L   Total Bilirubin 0.5 0.3 - 1.2 mg/dL   GFR calc non Af Amer 18 (L) >60 mL/min   GFR calc Af Amer 21 (L) >60 mL/min    Comment: (NOTE) The eGFR has been calculated using the CKD EPI equation. This calculation has not been validated in all clinical situations. eGFR's persistently <60 mL/min signify possible Chronic Kidney Disease.    Anion gap 8 5 - 15  Ethanol     Status: None   Collection Time: 05/10/15 11:19 PM  Result Value Ref Range   Alcohol, Ethyl (B) <5 <5 mg/dL    Comment:        LOWEST DETECTABLE  LIMIT FOR SERUM ALCOHOL IS 5 mg/dL FOR MEDICAL PURPOSES ONLY   CBC with Diff     Status: Abnormal   Collection Time: 05/10/15 11:19 PM  Result Value Ref Range   WBC 6.3 4.0 - 10.5 K/uL   RBC 3.44 (L) 3.87 - 5.11 MIL/uL   Hemoglobin 10.5 (L) 12.0 - 15.0 g/dL   HCT 33.2 (L) 36.0 - 46.0 %   MCV 96.5 78.0 - 100.0 fL   MCH 30.5 26.0 - 34.0 pg   MCHC 31.6 30.0 - 36.0 g/dL   RDW 15.2 11.5 - 15.5 %   Platelets 163 150 - 400 K/uL   Neutrophils Relative % 56 %   Neutro Abs 3.6 1.7 - 7.7 K/uL   Lymphocytes Relative 24 %   Lymphs Abs 1.5 0.7 - 4.0 K/uL   Monocytes Relative 11 %   Monocytes Absolute 0.7 0.1 - 1.0 K/uL   Eosinophils Relative 9 %   Eosinophils Absolute 0.6 0.0 - 0.7 K/uL   Basophils Relative 0 %   Basophils Absolute 0.0 0.0 - 0.1 K/uL  Salicylate level     Status: None   Collection Time: 05/10/15 11:19 PM  Result Value Ref Range   Salicylate Lvl  <6.6 2.8 - 30.0 mg/dL  Acetaminophen level     Status: Abnormal   Collection Time: 05/10/15 11:19 PM  Result Value Ref Range   Acetaminophen (Tylenol), Serum <10 (L) 10 - 30 ug/mL    Comment:        THERAPEUTIC CONCENTRATIONS VARY SIGNIFICANTLY. A RANGE OF 10-30 ug/mL MAY BE AN EFFECTIVE CONCENTRATION FOR MANY PATIENTS. HOWEVER, SOME ARE BEST TREATED AT CONCENTRATIONS OUTSIDE THIS RANGE. ACETAMINOPHEN CONCENTRATIONS >150 ug/mL AT 4 HOURS AFTER INGESTION AND >50 ug/mL AT 12 HOURS AFTER INGESTION ARE OFTEN ASSOCIATED WITH TOXIC REACTIONS.   Urine rapid drug screen (hosp performed)not at [facility]     Status: None   Collection Time: 05/11/15  9:06 AM  Result Value Ref Range   Opiates NONE DETECTED NONE DETECTED   Cocaine NONE DETECTED NONE DETECTED   Benzodiazepines NONE DETECTED NONE DETECTED   Amphetamines NONE DETECTED NONE DETECTED   Tetrahydrocannabinol NONE DETECTED NONE DETECTED   Barbiturates NONE DETECTED NONE DETECTED    Comment:        DRUG SCREEN FOR MEDICAL PURPOSES ONLY.  IF CONFIRMATION IS NEEDED FOR ANY PURPOSE, NOTIFY LAB WITHIN 5 DAYS.        LOWEST DETECTABLE LIMITS FOR URINE DRUG SCREEN Drug Class       Cutoff (ng/mL) Amphetamine      1000 Barbiturate      200 Benzodiazepine   440 Tricyclics       347 Opiates          300 Cocaine          300 THC              50   Urinalysis, Routine w reflex microscopic (not at [facility])     Status: Abnormal   Collection Time: 05/11/15  9:06 AM  Result Value Ref Range   Color, Urine YELLOW YELLOW   APPearance CLOUDY (A) CLEAR   Specific Gravity, Urine 1.010 1.005 - 1.030   pH 8.5 (H) 5.0 - 8.0   Glucose, UA NEGATIVE NEGATIVE mg/dL   Hgb urine dipstick SMALL (A) NEGATIVE   Bilirubin Urine NEGATIVE NEGATIVE   Ketones, ur NEGATIVE NEGATIVE mg/dL   Protein, ur 100 (A) NEGATIVE mg/dL   Urobilinogen, UA 0.2 0.0 - 1.0 mg/dL  Nitrite NEGATIVE NEGATIVE   Leukocytes, UA MODERATE (A) NEGATIVE  Urine microscopic-add on      Status: None   Collection Time: 05/11/15  9:06 AM  Result Value Ref Range   Squamous Epithelial / LPF RARE RARE   WBC, UA 7-10 <3 WBC/hpf   RBC / HPF 0-2 <3 RBC/hpf    No results found.  Review of Systems  Constitutional: Negative for fever and chills.  Respiratory: Negative for shortness of breath.   Cardiovascular: Negative for chest pain and orthopnea.  Gastrointestinal: Negative for nausea and vomiting.  Psychiatric/Behavioral: Positive for depression and suicidal ideas.   Blood pressure 115/70, pulse 91, temperature 98.6 F (37 C), temperature source Oral, resp. rate 18, weight 63 lb 7.9 oz (28.8 kg), SpO2 100 %. Physical Exam  Constitutional: She is oriented to person, place, and time.  HENT:  Mouth/Throat: No oropharyngeal exudate.  Eyes: Scleral icterus is present.  Neck: No JVD present.  Cardiovascular: Normal rate and regular rhythm.   Respiratory: No respiratory distress. She has no wheezes.  GI: There is no tenderness. There is no rebound.  Musculoskeletal: She exhibits no edema.  Neurological: She is alert and oriented to person, place, and time.    Assessment/Plan: Problem #1 drug overdose: Patient presently alert and oriented. She was in emergency room since Thursday. Problem #2 end-stage renal disease: Presently she denies any nausea or vomiting. Her potassium is low. Problem #3 hypertension: Her blood pressure is reasonably controlled Problem #4 anemia: Her hemoglobin and hematocrit is within our target goal. Problem #5 metabolic bone disease: Calcium is in range Problem #6 fluid management: Patient does not have a significant sign of fluid overload. Problem #7 hyponatremia. Plan: We'll make arrangements for patient urinalysis today We'll try to move about 3 L since her blood pressure tolerates We'll use 3K/2.5 calcium bath. We'll give her 4000 units of IV Epogen.  Jakerria Kingbird S 05/12/2015, 2:40 PM

## 2015-05-12 NOTE — ED Notes (Addendum)
Dr. Christy Gentles in to speak with pt. Pt requesting to go home. Dr. Christy Gentles in with pt and discussed that she would need to be re telepsyched in the morning and would also need to be dialyzed. EDP ok'd for pt to come off monitor.

## 2015-05-12 NOTE — Progress Notes (Signed)
Counselor received called from EDP requesting for NP/PA to complete tele-psych on patient as soon as possible in order to determine plan of disposition as patient needs dialysis today and is denying current SI. NP notified by counselor of EDP request. NP states that he will evaluate patient and still recommends inpatient treatment at this time due to overdose and high risks factors. EDP notified by counselor.    Boyce [name]. MSW, LCSW Therapeutic Triage Services-Triage Specialist   Phone: [phone]

## 2015-05-12 NOTE — H&P (Signed)
History and Physical  [name] [id number] DOB: 1990-09-24 DOA: 05/10/2015  Referring physician: Davonna Belling, MD PCP: No PCP Per Patient   Chief Complaint: Drug Overdose  HPI:  24 y/o female with a hx of Spina bifida, HTN, anemia, and ESRD (last dialyzed 10/13) presented s/p an overdose that occurred on 10/13. Per patient's cousin, she was found unconscious on the bathroom floor with a bottle of 25mg  Amitriptyline next to her. She is unsure how many pills she took. Per family members, she has never expressed SI before and has had no prior attempts. While in the ED, patient was tachycardic on arrival which resolved on its own. She had no evidence of wide QRS complexes. Labs were unremarkable.   Above taken from chart as patient does not wish to provide history.  Medically cleared by EDP. Psych evaluated the patient and referred her to inpatient psychiatry hospitalization, however she is due for dialysis on 10/15 which cannot be done as "outpatient" in ED therefore hospitalist asked to obs patient to facilitate HD in best interest of patient while awaiting bed availability.   Patient reports she does not remember what happened. She currently feels fine and denies any SI or HI. She denies any pain, rash, urinary changes, nausea, vomiting, abdominal pain, HA, CP, or SOB. She does not have an appetite. She is not willing provide further history.   In the emergency department VSS, afebrile, not hypoxic  Review of Systems:  Negative for fever, visual changes, sore throat, rash, new muscle aches, chest pain, SOB, dysuria, bleeding, n/v/abdominal pain.  Past Medical History  Diagnosis Date  . Spina bifida   . UTI (lower urinary tract infection)   . HTN (hypertension) 05/02/2011  . Anemia associated with chronic renal failure   . 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  . Blood transfusion   . Dialysis care   . ESRD (end stage renal disease) on dialysis [facility])      Past Surgical History  Procedure Laterality Date  . Av fistula placement      left arm    Social History:  reports that she has never smoked. She does not have any smokeless tobacco history on file. She reports that she does not drink alcohol or use illicit drugs. lives with their family Partial assistance  Allergies  Allergen Reactions  . Ciprofloxacin Shortness Of Breath, Nausea And Vomiting and Other (See Comments)    HIGH FEVER  . Other Anaphylaxis    Revaclear dialzer  . Peanut-Containing Drug Products Anaphylaxis  . Aleve [Naproxen Sodium] Other (See Comments)    G.I.Bleed  . Influenza Vaccines Nausea And Vomiting    High fever  . Tetanus Toxoids Nausea And Vomiting and Other (See Comments)    HIGH FEVER  . Latex Itching and Rash    Family History  Problem Relation Age of Onset  . Kidney cancer Other      Prior to Admission medications   Medication Sig Start Date End Date Taking? Authorizing Provider  acetaminophen (TYLENOL) 500 MG tablet Take 1,000 mg by mouth every 6 (six) hours as needed for moderate pain or headache.   Yes Historical Provider, MD  ALPRAZolam Duanne Moron) 0.25 MG tablet Take 1 tablet by mouth daily as needed. Before Dialysis and as needed. 11/09/14  Yes Historical Provider, MD  cinacalcet (SENSIPAR) 30 MG tablet Take 30 mg by mouth daily.   Yes Historical Provider, MD  Epoetin Alfa (EPOGEN IJ) Inject as directed. Pt  gets on dialysis days which are Tuesday, Thursday, and Saturday.   Yes Historical Provider, MD  ibuprofen (ADVIL,MOTRIN) 200 MG tablet Take 200 mg by mouth every 6 (six) hours as needed for headache or moderate pain.   Yes Historical Provider, MD  multivitamin (RENA-VIT) TABS tablet Take 1 tablet by mouth daily.   Yes Historical Provider, MD  SENSIPAR 60 MG tablet Take 1 tablet by mouth daily. 03/30/15  Yes Historical Provider, MD  sevelamer carbonate (RENVELA) 800 MG tablet Take 800 mg by mouth 3 (three) times daily with meals.    Yes  Historical Provider, MD  Vitamin D, Ergocalciferol, (DRISDOL) 50000 UNITS CAPS capsule Take 50,000 Units by mouth every 7 (seven) days. Takes on Monday.   Yes Historical Provider, MD  ondansetron (ZOFRAN ODT) 8 MG disintegrating tablet 8mg  ODT q4 hours prn nausea Patient not taking: Reported on 05/11/2015 12/07/14   Veryl Speak, MD   Physical Exam: Filed Vitals:   05/12/15 0200 05/12/15 0215 05/12/15 0230 05/12/15 0722  BP: 113/70  122/79 122/80  Pulse: 88 91 93 97  Temp:    98.7 F (37.1 C)  TempSrc:    Oral  Resp: 19 19 18 16   Weight:      SpO2: 100% 100% 100% 100%   VSS, afebrile, not hypoxic General:  Appears calm and comfortable Eyes: PERRL, normal lids, irises  ENT: grossly normal hearing, lips & tongue Neck: no LAD, masses or thyromegaly Cardiovascular: RRR, no m/r/g. No LE edema. Respiratory: CTA bilaterally, no w/r/r. Normal respiratory effort. Psychiatric: tearful, hesitant to cooperate with exam and interview, answers few questions, denies any SI    Wt Readings from Last 3 Encounters:  05/10/15 24.948 kg (55 lb)  12/07/14 26.309 kg (58 lb)  12/05/14 26.7 kg (58 lb 13.8 oz)    Labs on Admission:  Basic Metabolic Panel:  Recent Labs Lab 05/10/15 2319  NA 131*  K 3.1*  CL 95*  CO2 28  GLUCOSE 108*  BUN 17  CREATININE 3.46*  CALCIUM 8.5*    Liver Function Tests:  Recent Labs Lab 05/10/15 2319  AST 21  ALT 13*  ALKPHOS 108  BILITOT 0.5  PROT 7.2  ALBUMIN 3.7    CBC:  Recent Labs Lab 05/10/15 2319  WBC 6.3  NEUTROABS 3.6  HGB 10.5*  HCT 33.2*  MCV 96.5  PLT 163      Principal Problem:   Overdose Active Problems:   Spina bifida ([place])   Suicide attempt ([place])   Assessment/Plan 1. Suicide attempt, overdose with tricyclics. Medically clear.  2. ESRD, on dialysis Tuesday, Thursday, and Saturday.  3. Spina Bifida.   10/14 psychiatry recommended inpatient medical treatment. Currently the patient denies SI. 10/15 psychiatry still  recommending inpatient treatment.  ESRD with normal dialysis day today, management per nephrology.  Will obs to facilitate HD in best interest of patient, but she is medically clear for transfer to inpatient facility when bed available.  Discussed with patient. Interviewed and examined in presence of scribe [employer] and tech Roosevelt Locks.  Code Status: Full  DVT prophylaxis:SCDs Family Communication: No family at bedside. Discussed with patient who understands and has no concerns at this time. Disposition Plan/Anticipated LOS: Obs to medical bed.   Time spent: 55 minutes  Murray Hodgkins, MD  [facility] Pager [phone] 05/12/2015, 9:16 AM   By signing my name below, I, Rosalie Doctor attest that this documentation has been prepared under the direction and in the presence of Murray Hodgkins,  MD Electronically signed: Rosalie Doctor, Scribe. 05/12/2015  9:00am  I personally performed the services described in this documentation. All medical record entries made by the scribe were at my direction. I have reviewed the chart and agree that the record reflects my personal performance and is accurate and complete. Murray Hodgkins, MD

## 2015-05-12 NOTE — ED Notes (Signed)
Pt requesting to go home, Dr Alvino Chapel informed.

## 2015-05-12 NOTE — ED Notes (Signed)
Dr Alvino Chapel informed of pt refusing medications.

## 2015-05-13 DIAGNOSIS — Z992 Dependence on renal dialysis: Secondary | ICD-10-CM

## 2015-05-13 DIAGNOSIS — T43011A Poisoning by tricyclic antidepressants, accidental (unintentional), initial encounter: Secondary | ICD-10-CM | POA: Diagnosis not present

## 2015-05-13 DIAGNOSIS — N186 End stage renal disease: Secondary | ICD-10-CM | POA: Diagnosis not present

## 2015-05-13 DIAGNOSIS — I10 Essential (primary) hypertension: Secondary | ICD-10-CM | POA: Diagnosis not present

## 2015-05-13 DIAGNOSIS — Q059 Spina bifida, unspecified: Secondary | ICD-10-CM

## 2015-05-13 DIAGNOSIS — T1491 Suicide attempt: Secondary | ICD-10-CM | POA: Diagnosis not present

## 2015-05-13 DIAGNOSIS — T50902A Poisoning by unspecified drugs, medicaments and biological substances, intentional self-harm, initial encounter: Secondary | ICD-10-CM | POA: Diagnosis not present

## 2015-05-13 NOTE — Progress Notes (Signed)
Dr. Sarajane Jews to room, patient wanting to go home, Dr. Sarajane Jews informed patient waiting bed, patient began yelling, Becky RN offered prn xanax, patient refused, will continue to monitor.

## 2015-05-13 NOTE — Discharge Summary (Addendum)
Physician Discharge Summary  [name] [id number] DOB: [birth year] DOA: 05/10/2015  PCP: No PCP Per Patient  Admit date: 05/10/2015 Discharge date: 05/14/2015  Transfer to inpatient psychiatry unit.     Discharge Diagnoses:  1. Suicide attempt with tricyclic overdose. 2. ESRD. 3. Spina bifida.  Discharge Condition: Improved Disposition: Inpatient psychiatry unit  Diet recommendation: Regular  Filed Weights   05/12/15 1345 05/12/15 1450 05/12/15 1811  Weight: 28.8 kg (63 lb 7.9 oz) 28.8 kg (63 lb 7.9 oz) 28.9 kg (63 lb 11.4 oz)    History of present illness:  24 y/o female with a hx of Spina bifida, HTN, anemia, and ESRD (last dialyzed 10/13) presented s/p an overdose that occurred on 10/13. Per patient's cousin, she was found unconscious on the bathroom floor with a bottle of 25mg  Amitriptyline next to her. She is unsure how many pills she took. Per family members, she has never expressed SI before and has had no prior attempts. While in the ED, patient was tachycardic on arrival which resolved on its own. She had no evidence of wide QRS complexes. Labs were unremarkable.   Hospital Course:  Suicide attempt with tricyclics with no apparent sequela. She was medically cleared and seen by psychiatry who recommended inpatient psychiatric treament, however facility was not immediately available and because of technicalities, patient required observation in hospital for routine HD  Individual issues as below:   1. Suicide attempt, overdose with tricyclics. Medically clear. Psychiatry consulted and recommends inpatient [facility] admission.  2. ESRD on HD TTS. 3. Spina Bifida.  Consultants:  Nephrology  Procedures:  Dialysis 10/15  Antibiotics:  none  Discharge Instructions   Current Discharge Medication List    CONTINUE these medications which have NOT CHANGED   Details  acetaminophen (TYLENOL) 500 MG tablet Take 1,000 mg by mouth every 6 (six) hours as needed  for moderate pain or headache.    ALPRAZolam (XANAX) 0.25 MG tablet Take 1 tablet by mouth daily as needed. Before Dialysis and as needed.    cinacalcet (SENSIPAR) 30 MG tablet Take 30 mg by mouth daily.    Epoetin Alfa (EPOGEN IJ) Inject as directed. Pt gets on dialysis days which are Tuesday, Thursday, and Saturday.    multivitamin (RENA-VIT) TABS tablet Take 1 tablet by mouth daily.    sevelamer carbonate (RENVELA) 800 MG tablet Take 800 mg by mouth 3 (three) times daily with meals.     Vitamin D, Ergocalciferol, (DRISDOL) 50000 UNITS CAPS capsule Take 50,000 Units by mouth every 7 (seven) days. Takes on Monday.      STOP taking these medications     ibuprofen (ADVIL,MOTRIN) 200 MG tablet      ondansetron (ZOFRAN ODT) 8 MG disintegrating tablet        Allergies  Allergen Reactions  . Ciprofloxacin Shortness Of Breath, Nausea And Vomiting and Other (See Comments)    HIGH FEVER  . Other Anaphylaxis    Revaclear dialzer  . Peanut-Containing Drug Products Anaphylaxis  . Aleve [Naproxen Sodium] Other (See Comments)    G.I.Bleed  . Influenza Vaccines Nausea And Vomiting    High fever  . Tetanus Toxoids Nausea And Vomiting and Other (See Comments)    HIGH FEVER  . Latex Itching and Rash    The results of significant diagnostics from this hospitalization (including imaging, microbiology, ancillary and laboratory) are listed below for reference.     Labs: Basic Metabolic Panel:  Recent Labs Lab 05/10/15 2319 05/12/15 1600 05/14/15 0612  NA  131* 135 136  K 3.1* 3.8 4.1  CL 95* 103 95*  CO2 28 22 28   GLUCOSE 108* 78 81  BUN 17 31* 42*  CREATININE 3.46* 5.60* 6.10*  CALCIUM 8.5* 8.6* 9.9  PHOS  --  4.2 6.5*   Liver Function Tests:  Recent Labs Lab 05/10/15 2319 05/12/15 1600  AST 21  --   ALT 13*  --   ALKPHOS 108  --   BILITOT 0.5  --   PROT 7.2  --   ALBUMIN 3.7 3.5   CBC:  Recent Labs Lab 05/10/15 2319 05/12/15 1600  WBC 6.3 4.7  NEUTROABS 3.6   --   HGB 10.5* 9.9*  HCT 33.2* 31.1*  MCV 96.5 96.0  PLT 163 155    Principal Problem:   Overdose Active Problems:   Spina bifida ([place])   Suicide attempt [facility])   Time coordinating discharge: 20 minutes  Signed:  Murray Hodgkins, MD [facility] 05/14/2015, 11:07 AM  By signing my name below, I, Rosalie Doctor attest that this documentation has been prepared under the direction and in the presence of Murray Hodgkins, MD Electronically signed: Rosalie Doctor, Scribe.  05/13/2015  I personally performed the services described in this documentation. All medical record entries made by the scribe were at my direction. I have reviewed the chart and agree that the record reflects my personal performance and is accurate and complete. Murray Hodgkins, MD

## 2015-05-13 NOTE — Progress Notes (Signed)
PROGRESS NOTE  [name] [id number] DOB: 28-Oct-1990 DOA: 05/10/2015 PCP: No PCP Per Patient  Summary: 24 y/o female with a hx of Spina bifida, HTN, anemia, and ESRD (last dialyzed 10/13) presented s/p an overdose that occurred on 10/13. Per patient's cousin, she was found unconscious on the bathroom floor with a bottle of 25mg  Amitriptyline next to her. She is unsure how many pills she took. Per family members, she has never expressed SI before and has had no prior attempts. While in the ED, patient was tachycardic on arrival which resolved on its own. She had no evidence of wide QRS complexes. Labs were unremarkable.   Assessment/Plan: 1. Suicide attempt, overdose with tricyclics with no apparent sequela. Medically clear.  Psychiatry consulted and recommends inpatient psychiatric admission.  2. ESRD, Received dialysis 10/15.  3. Spina Bifida. 4. Hypokalemia, repleted. 5. Hyponatremia, repleted.   Stable. Medically clear. Hopefully transfer to inpatient psychiatry facility today.   Code Status: Full DVT prophylaxis: SCDs Family Communication: No family at bedside. Discussed with patient who understands and has no concerns at this time. Disposition Plan: Hopefully transfer to inpatient psychiatry facility today.  Murray Hodgkins, MD  [facility]  Pager [phone] If 7PM-7AM, please contact night-coverage at [email or web address], password [email or web address] 05/13/2015, 7:34 AM    Consultants:  Nephrology  Procedures:  Dialysis 10/15  Antibiotics:    HPI/Subjective: Feels fine. Ate breakfast. Denies any nausea or vomiting.   Objective: Filed Vitals:   05/12/15 1730 05/12/15 1811 05/12/15 2212 05/13/15 0639  BP: 130/81 133/78 140/84 132/70  Pulse: 91 99 110 88  Temp:  97.7 F (36.5 C) 98.1 F (36.7 C) 97.9 F (36.6 C)  TempSrc:  Oral Oral Oral  Resp:  15 16 16   Weight:  28.9 kg (63 lb 11.4 oz)    SpO2:  97% 100% 100%    Intake/Output Summary (Last 24 hours) at  05/13/15 0734 Last data filed at 05/12/15 1811  Gross per 24 hour  Intake      0 ml  Output    400 ml  Net   -400 ml     Filed Weights   05/12/15 1345 05/12/15 1450 05/12/15 1811  Weight: 28.8 kg (63 lb 7.9 oz) 28.8 kg (63 lb 7.9 oz) 28.9 kg (63 lb 11.4 oz)    Exam:    VSS, afebrile, not hypoxic General:  Appears comfortable, calm. Cardiovascular: Regular rate and rhythm, no murmur, rub or gallop.  Respiratory: Clear to auscultation bilaterally, no wheezes, rales or rhonchi. Normal respiratory effort. Psychiatric: withdrawn, avoids eye contact  New data reviewed:    Pertinent data since admission:    Pending data:    Scheduled Meds: . cinacalcet  30 mg Oral Q breakfast  . multivitamin  1 tablet Oral Daily  . sevelamer carbonate  800 mg Oral TID WC  . Vitamin D (Ergocalciferol)  50,000 Units Oral Q7 days   Continuous Infusions:   Principal Problem:   Overdose Active Problems:   Spina bifida ([place])   Suicide attempt ([place])   By signing my name below, I, Rosalie Doctor attest that this documentation has been prepared under the direction and in the presence of Murray Hodgkins, MD Electronically signed: Rosalie Doctor, Scribe.  05/13/2015 10:20am  I personally performed the services described in this documentation. All medical record entries made by the scribe were at my direction. I have reviewed the chart and agree that the record reflects my personal performance and is accurate and complete. Murray Hodgkins, MD

## 2015-05-13 NOTE — Progress Notes (Signed)
Subjective: Patient presently offers no complaints. She doesn't have any nausea or vomiting. She denies also any difficulty in breathing.   Objective: Vital signs in last 24 hours: Temp:  [97.7 F (36.5 C)-98.8 F (37.1 C)] 97.9 F (36.6 C) (10/16 0639) Pulse Rate:  [88-125] 88 (10/16 0639) Resp:  [15-18] 16 (10/16 0639) BP: (106-144)/(63-84) 132/70 mmHg (10/16 0639) SpO2:  [97 %-100 %] 100 % (10/16 0639) Weight:  [63 lb 7.9 oz (28.8 kg)-63 lb 11.4 oz (28.9 kg)] 63 lb 11.4 oz (28.9 kg) (10/15 1811)  Intake/Output from previous day: 10/15 0701 - 10/16 0700 In: -  Out: 400  Intake/Output this shift:     Recent Labs  05/10/15 2319 05/12/15 1600  HGB 10.5* 9.9*    Recent Labs  05/10/15 2319 05/12/15 1600  WBC 6.3 4.7  RBC 3.44* 3.24*  HCT 33.2* 31.1*  PLT 163 155    Recent Labs  05/10/15 2319 05/12/15 1600  NA 131* 135  K 3.1* 3.8  CL 95* 103  CO2 28 22  BUN 17 31*  CREATININE 3.46* 5.60*  GLUCOSE 108* 78  CALCIUM 8.5* 8.6*   No results for input(s): LABPT, INR in the last 72 hours.  Generally patient is alert and in no apparent distress. Chest is clear to auscultation Heart exam regular rate and rhythm no murmur Extremities no edema  Assessment/Plan: Problem #1 history of drug overdose. Patient presently alert and has returned to her baseline. Problem #2 end-stage renal disease: She is status post hemodialysis yesterday. Presently she doesn't have any nausea or vomiting. Problem #3 hypokalemia: Her potassium has corrected Problem #4 hyponatremia: Sodium is 135 has improved Problem #5 anemia: Her hemoglobin and hematocrit has declined and is below our target goal. Problem #6 hypertension: Her blood pressure is reasonably controlled. Problem# 7 metabolic bone disease: Her calcium is in range. Presently phosphorus is not available. A shunt is on a binder. Plan: We'll check her basic metabolic panel and phosphorus in the morning.   [name]  S 05/13/2015, 8:51 AM

## 2015-05-13 NOTE — Progress Notes (Signed)
Disposition CSW contacted [facility] CSW who stated patient has been accepted pending bed availability.  Currently [facility] is at capacity, but likely will have a bed on Monday.  [place] Disposition CSW [phone]

## 2015-05-13 NOTE — Progress Notes (Signed)
Sitter called my phone, stated patient wanted to talk to Dr. Sarajane Jews, would not state what she wanted to discuss with him, sent e-page, Dr. Sarajane Jews returned call, stated he would come to room when time allowed, patient and sitter informed.

## 2015-05-14 ENCOUNTER — Encounter: Payer: Self-pay | Admitting: Psychiatry

## 2015-05-14 ENCOUNTER — Inpatient Hospital Stay
Admission: EM | Admit: 2015-05-14 | Discharge: 2015-05-18 | DRG: 885 | Disposition: A | Discharge status: Home / Self Care | Payer: Medicare Other | Source: Other Acute Inpatient Hospital | Attending: Psychiatry | Admitting: Psychiatry

## 2015-05-14 DIAGNOSIS — N186 End stage renal disease: Secondary | ICD-10-CM | POA: Diagnosis not present

## 2015-05-14 DIAGNOSIS — F32A Depression, unspecified: Secondary | ICD-10-CM

## 2015-05-14 DIAGNOSIS — D649 Anemia, unspecified: Secondary | ICD-10-CM | POA: Diagnosis present

## 2015-05-14 DIAGNOSIS — Z993 Dependence on wheelchair: Secondary | ICD-10-CM | POA: Diagnosis not present

## 2015-05-14 DIAGNOSIS — T43011A Poisoning by tricyclic antidepressants, accidental (unintentional), initial encounter: Secondary | ICD-10-CM | POA: Diagnosis not present

## 2015-05-14 DIAGNOSIS — Q059 Spina bifida, unspecified: Secondary | ICD-10-CM | POA: Diagnosis not present

## 2015-05-14 DIAGNOSIS — R000 Tachycardia, unspecified: Secondary | ICD-10-CM | POA: Diagnosis present

## 2015-05-14 DIAGNOSIS — Z887 Allergy status to serum and vaccine status: Secondary | ICD-10-CM

## 2015-05-14 DIAGNOSIS — G4700 Insomnia, unspecified: Secondary | ICD-10-CM | POA: Diagnosis present

## 2015-05-14 DIAGNOSIS — F322 Major depressive disorder, single episode, severe without psychotic features: Secondary | ICD-10-CM | POA: Diagnosis not present

## 2015-05-14 DIAGNOSIS — D631 Anemia in chronic kidney disease: Secondary | ICD-10-CM | POA: Diagnosis not present

## 2015-05-14 DIAGNOSIS — I10 Essential (primary) hypertension: Secondary | ICD-10-CM | POA: Diagnosis present

## 2015-05-14 DIAGNOSIS — N2581 Secondary hyperparathyroidism of renal origin: Secondary | ICD-10-CM | POA: Diagnosis not present

## 2015-05-14 DIAGNOSIS — Z79899 Other long term (current) drug therapy: Secondary | ICD-10-CM | POA: Diagnosis not present

## 2015-05-14 DIAGNOSIS — Z992 Dependence on renal dialysis: Secondary | ICD-10-CM

## 2015-05-14 DIAGNOSIS — Z8051 Family history of malignant neoplasm of kidney: Secondary | ICD-10-CM

## 2015-05-14 DIAGNOSIS — R279 Unspecified lack of coordination: Secondary | ICD-10-CM | POA: Diagnosis not present

## 2015-05-14 DIAGNOSIS — N390 Urinary tract infection, site not specified: Secondary | ICD-10-CM | POA: Diagnosis not present

## 2015-05-14 DIAGNOSIS — I120 Hypertensive chronic kidney disease with stage 5 chronic kidney disease or end stage renal disease: Secondary | ICD-10-CM | POA: Diagnosis present

## 2015-05-14 DIAGNOSIS — Z91040 Latex allergy status: Secondary | ICD-10-CM | POA: Diagnosis not present

## 2015-05-14 DIAGNOSIS — T50902A Poisoning by unspecified drugs, medicaments and biological substances, intentional self-harm, initial encounter: Secondary | ICD-10-CM | POA: Diagnosis not present

## 2015-05-14 DIAGNOSIS — Z888 Allergy status to other drugs, medicaments and biological substances status: Secondary | ICD-10-CM | POA: Diagnosis not present

## 2015-05-14 DIAGNOSIS — Z743 Need for continuous supervision: Secondary | ICD-10-CM | POA: Diagnosis not present

## 2015-05-14 DIAGNOSIS — Z915 Personal history of self-harm: Secondary | ICD-10-CM

## 2015-05-14 DIAGNOSIS — D638 Anemia in other chronic diseases classified elsewhere: Secondary | ICD-10-CM | POA: Diagnosis present

## 2015-05-14 HISTORY — DX: Depression, unspecified: F32.A

## 2015-05-14 HISTORY — DX: Major depressive disorder, single episode, unspecified: F32.9

## 2015-05-14 LAB — BASIC METABOLIC PANEL
ANION GAP: 13 (ref 5–15)
BUN: 42 mg/dL — ABNORMAL HIGH (ref 6–20)
CALCIUM: 9.9 mg/dL (ref 8.9–10.3)
CHLORIDE: 95 mmol/L — AB (ref 101–111)
CO2: 28 mmol/L (ref 22–32)
CREATININE: 6.1 mg/dL — AB (ref 0.44–1.00)
GFR calc non Af Amer: 9 mL/min — ABNORMAL LOW (ref >60)
GFR, EST AFRICAN AMERICAN: 10 mL/min — AB (ref >60)
Glucose, Bld: 81 mg/dL (ref 65–99)
Potassium: 4.1 mmol/L (ref 3.5–5.1)
SODIUM: 136 mmol/L (ref 135–145)

## 2015-05-14 LAB — PHOSPHORUS: Phosphorus: 6.5 mg/dL — ABNORMAL HIGH (ref 2.5–4.6)

## 2015-05-14 MED ORDER — HYDROXYZINE HCL 50 MG PO TABS: 50.0000 mg | ORAL_TABLET | Freq: Three times a day (TID) | ORAL | Status: DC | PRN

## 2015-05-14 MED ORDER — ALUM & MAG HYDROXIDE-SIMETH 200-200-20 MG/5ML PO SUSP: 30.0000 mL | ORAL | Status: DC | PRN

## 2015-05-14 MED ORDER — MAGNESIUM HYDROXIDE 400 MG/5ML PO SUSP: 30.0000 mL | Freq: Every day | ORAL | Status: DC | PRN

## 2015-05-14 MED ORDER — ACETAMINOPHEN 325 MG PO TABS: 650.0000 mg | ORAL_TABLET | Freq: Four times a day (QID) | ORAL | Status: DC | PRN

## 2015-05-14 NOTE — Progress Notes (Signed)
Patient accepted at [facility], to Dr. Jerilee Hoh, to bed 301, arrival time - call report first at [phone]. RN Lattie Haw informed.  Verlon Setting, [place] Disposition staff 05/14/2015 3:46 PM

## 2015-05-14 NOTE — Progress Notes (Signed)
Followed up on inpatient psychiatric placement efforts.  [place]- Faxed referral information to [facility] as intake requested additional information. Per weekend LCSW, pt was accepted, awaiting to have bed availability. [facility]- are able to manage dialysis on their behavioral unit, but have no beds currently.  [place]- received referral and will be reviewing today.  Declined at [facility] and [facility] due to not being able to manage pt's dialysis needs. Will continue following placement efforts.   Sharren Bridge, MSW, LCSW Clinical Social Work, Disposition  05/14/2015 [phone]

## 2015-05-14 NOTE — Progress Notes (Signed)
Subjective: Patient presently offers no complaints. She denies any difficulty breathing.  Objective: Vital signs in last 24 hours: Temp:  [98.1 F (36.7 C)-99.3 F (37.4 C)] 98.1 F (36.7 C) (10/17 0653) Pulse Rate:  [98-105] 98 (10/17 0653) Resp:  [16] 16 (10/17 0653) BP: (128-130)/(67-84) 128/84 mmHg (10/17 0653) SpO2:  [100 %] 100 % (10/17 0653)  Intake/Output from previous day: 10/16 0701 - 10/17 0700 In: -  Out: 600 [Urine:600] Intake/Output this shift:     Recent Labs  05/12/15 1600  HGB 9.9*    Recent Labs  05/12/15 1600  WBC 4.7  RBC 3.24*  HCT 31.1*  PLT 155    Recent Labs  05/12/15 1600 05/14/15 0612  NA 135 136  K 3.8 4.1  CL 103 95*  CO2 22 28  BUN 31* 42*  CREATININE 5.60* 6.10*  GLUCOSE 78 81  CALCIUM 8.6* 9.9   No results for input(Haynes): LABPT, INR in the last 72 hours.  Generally patient is alert and in no apparent distress. Chest is clear to auscultation Heart exam regular rate and rhythm no murmur Extremities no edema  Assessment/Plan: Problem #1 history of drug overdose. Patient presently alert and has returned to her baseline. Waiting for transfer Problem #2 end-stage renal disease: She is status post hemodialysis on Saturday. Her potassium is normal. She doesn't have any uremic sinus symptoms. Problem #3 hypokalemia: Her potassium has corrected Problem #4 hyponatremia: Sodium normalized Problem #5 anemia: Her hemoglobin and hematocrit has declined and is below our target goal. Patient on Epogen Problem #6 hypertension: Her blood pressure is reasonably controlled. Problem# 7 metabolic bone disease: Her calcium is in range but her phosphorus is above our target goal. Presently she is on Renvela 800 mg by mouth 3 times a day with meals.. Plan: Patient does require dialysis today Her next dialysis will be tomorrow which is her regular schedule.   [name] 05/14/2015, 8:00 AM

## 2015-05-14 NOTE — Progress Notes (Signed)
  PROGRESS NOTE  [name] [id number] DOB: 25-Mar-1991 DOA: 05/10/2015 PCP: No PCP Per Patient  Summary: 24 y/o female with a hx of Spina bifida, HTN, anemia, and ESRD (last dialyzed 10/13) presented s/p an overdose that occurred on 10/13. Per patient's cousin, she was found unconscious on the bathroom floor with a bottle of 25mg  Amitriptyline next to her. She is unsure how many pills she took. Per family members, she has never expressed SI before and has had no prior attempts. While in the ED, patient was tachycardic on arrival which resolved on its own. She had no evidence of wide QRS complexes. Labs were unremarkable.   Assessment/Plan: 1. Suicide attempt, overdose with tricyclics with no apparent sequela. Medically clear.  Psychiatry consulted and recommends inpatient psychiatric admission.  2. ESRD on HD, TTS. 3. Spina Bifida.   Stable. Medically clear.  Transfer to inpatient psychiatry facility today.   Code Status: Full DVT prophylaxis: SCDs Family Communication: No family at bedside. Discussed with patient who understands and has no concerns at this time. Disposition Plan: Hopefully transfer to inpatient psychiatry facility today.  Murray Hodgkins, MD  [facility]  Pager [phone] If 7PM-7AM, please contact night-coverage at [email or web address], password [facility] 05/14/2015, 10:00 AM    Consultants:  Nephrology  Procedures:  Dialysis 10/15  Antibiotics:    HPI/Subjective:  Feels fine.   Objective: Filed Vitals:   05/12/15 2212 05/13/15 0639 05/13/15 2157 05/14/15 0653  BP: 140/84 132/70 130/67 128/84  Pulse: 110 88 105 98  Temp: 98.1 F (36.7 C) 97.9 F (36.6 C) 99.3 F (37.4 C) 98.1 F (36.7 C)  TempSrc: Oral Oral Oral Oral  Resp: 16 16 16 16   Weight:      SpO2: 100% 100% 100% 100%    Intake/Output Summary (Last 24 hours) at 05/14/15 1000 Last data filed at 05/14/15 0900  Gross per 24 hour  Intake    120 ml  Output    600 ml  Net   -480  ml     Filed Weights   05/12/15 1345 05/12/15 1450 05/12/15 1811  Weight: 28.8 kg (63 lb 7.9 oz) 28.8 kg (63 lb 7.9 oz) 28.9 kg (63 lb 11.4 oz)    Exam:    VSS, afebrile, not hypoxic General:  Appears comfortable, calm.   Scheduled Meds: . cinacalcet  30 mg Oral Q breakfast  . multivitamin  1 tablet Oral Daily  . sevelamer carbonate  800 mg Oral TID WC  . Vitamin D (Ergocalciferol)  50,000 Units Oral Q7 days   Continuous Infusions:   Principal Problem:   Overdose Active Problems:   Spina bifida ([place])   Suicide attempt ([place])   By signing my name below, I, Rennis Harding attest that this documentation has been prepared under the direction and in the presence of Murray Hodgkins, MD Electronically signed: Rennis Harding  05/14/2015 10:00 AM   I personally performed the services described in this documentation. All medical record entries made by the scribe were at my direction. I have reviewed the chart and agree that the record reflects my personal performance and is accurate and complete. Murray Hodgkins, MD

## 2015-05-14 NOTE — Clinical Social Work Note (Signed)
Clinical Social Work Assessment  Patient Details  Name: [name] MRN: [id number] Date of Birth: March 06, 1991  Date of referral:  05/14/15               Reason for consult:  Suicide Risk/Attempt                Permission sought to share information with:    Permission granted to share information::     Name::        Agency::     Relationship::     Contact Information:     Housing/Transportation Living arrangements for the past 2 months:  Single Family Home Source of Information:  Patient Patient Interpreter Needed:  None Criminal Activity/Legal Involvement Pertinent to Current Situation/Hospitalization:  No - Comment as needed Significant Relationships:   (Aunt and Paternal Grandmother) Lives with:   (Aunt and Paternal Grandmother) Do you feel safe going back to the place where you live?  Yes Need for family participation in patient care:  Yes (Comment)  Care giving concerns:  None identified.    Social Worker assessment / plan:  Patient stated "I took some pills. It was just a bad day." She indicated that she had a lot of built up frustration and that she took the pills due to her frustration level.  Patient advised that she resides with her paternal grandmother and aunt and that at the time they (paternal grandmother and aunt) were arguing.  She stated that her grandmother told her aunt that she was going to have to find somewhere else to live and that this upset her due to her aunt being her main caregiver for the past five years. Patient advised that she was also frustrated about being unable to find a job. She reports that she feels that being in a wheelchair prevents employers from wanting to hire her.  Patient stated that she has been depressed lately.  She stated that she took about eight prescription sleeping pills.  She stated that her father died when she was [age] years old. Patient advised that she does not have a close relationship with her mother who lost custody of  her and her siblings in 2010.  She stated that she currently has not seen her mother in three months.  Patient advised that she receives dialysis on T, [place] and Sat.  She stated that she goes to [facility] for her dialysis. CSW discussed patient going to [facility]. Patient advised that she does not want to go to inpatient [facility] but is willing to go.  CSW provided supportive counseling and encouragement.   Employment status:  Disabled (Comment on whether or not currently receiving Disability) Insurance information:  Medicare PT Recommendations:  Not assessed at this time Information / Referral to community resources:     Patient/Family's Response to care:  Patient is agreeable to go to inpatient [facility].   Patient/Family's Understanding of and Emotional Response to Diagnosis, Current Treatment, and Prognosis:  CSW explained to patient the reason that patient was being admitted at [facility] patient understood her diagnosis, treatment and prognosis.   Emotional Assessment Appearance:  Appears stated age Attitude/Demeanor/Rapport:   (Cooperative) Affect (typically observed):  Accepting Orientation:  Oriented to Self, Oriented to Place, Oriented to  Time, Oriented to Situation Alcohol / Substance use:  Not Applicable, Alcohol Use Psych involvement (Current and /or in the community):  Yes (Comment) (Patient is awaiting a bed at [facility] for inpatient commitment.)  Discharge Needs  Concerns  to be addressed:  Coping/Stress Concerns Readmission within the last 30 days:  No Current discharge risk:  Chronically ill Barriers to Discharge:   (Awaiting a bed at [facility] for inpatient commitment. )   Ihor Gully, LCSW 05/14/2015, 12:06 PM [phone]

## 2015-05-14 NOTE — Progress Notes (Addendum)
Transferred to [place], out in stable condition via stretcher with [facility] and [employer]. Reported to Tyler Pita, RN.

## 2015-05-14 NOTE — Care Management Note (Signed)
Case Management Note  Patient Details  Name: [name] MRN: [id number] Date of Birth: 1990-10-17  Expected Discharge Date:                  Expected Discharge Plan:  [facility]  In-House Referral:  Clinical Social Work  Discharge planning Services  CM Consult  Post Acute Care Choice:  NA Choice offered to:  NA  DME Arranged:    DME Agency:     HH Arranged:    [place] Agency:     Status of Service:  Completed, signed off  Medicare Important Message Given:    Date Medicare IM Given:    Medicare IM give by:    Date Additional Medicare IM Given:    Additional Medicare Important Message give by:     If discussed at [place] of Stay Meetings, dates discussed:    Additional Comments: Pt is from home and ind at baseline. Pt is wheelchair bound. Pt is on HD T/TH/Sa at [facility]. Pt admitted after overdose. TTS has recommended inpt psych treatment, pt required to go to [facility] to get HD during treatment. Pt has been accepted but is waiting for bed. No CM needs noted.  Sherald Barge, RN 05/14/2015, 1:29 PM

## 2015-05-14 NOTE — Clinical Social Work Note (Signed)
Pt accepted at [facility]. Pt notified and requested that CSW call her aunt regarding pt's wheelchair. CSW discussed with supervisor regarding transportation and suggestion was made for [facility] with sheriff following as pt is under IVC. RN aware that aunt requests to know by 5:00 if wheelchair can go with pt as they will be unable to drive to [place].   Benay Pike, [place]

## 2015-05-15 DIAGNOSIS — F322 Major depressive disorder, single episode, severe without psychotic features: Secondary | ICD-10-CM

## 2015-05-15 LAB — T4, FREE: Free T4: 0.85 ng/dL (ref 0.61–1.12)

## 2015-05-15 LAB — TSH: TSH: 0.867 u[IU]/mL (ref 0.350–4.500)

## 2015-05-15 MED ORDER — SEVELAMER CARBONATE 800 MG PO TABS
800.0000 mg | ORAL_TABLET | Freq: Three times a day (TID) | ORAL | Status: DC
  Administered 2015-05-16 – 2015-05-18 (×6): 800 mg via ORAL
  Filled 2015-05-15 (×7): qty 1

## 2015-05-15 MED ORDER — NEPHRO-VITE 0.8 MG PO TABS
1.0000 | ORAL_TABLET | Freq: Every day | ORAL | Status: DC
  Administered 2015-05-16 – 2015-05-18 (×2): 1 via ORAL
  Filled 2015-05-15 (×5): qty 1

## 2015-05-15 MED ORDER — MIRTAZAPINE 15 MG PO TABS
15.0000 mg | ORAL_TABLET | Freq: Every day | ORAL | Status: DC
  Administered 2015-05-15 – 2015-05-16 (×2): 15 mg via ORAL
  Filled 2015-05-15 (×2): qty 1

## 2015-05-15 MED ORDER — ONDANSETRON HCL 4 MG PO TABS
4.0000 mg | ORAL_TABLET | Freq: Three times a day (TID) | ORAL | Status: DC | PRN
  Administered 2015-05-17: 4 mg via ORAL
  Filled 2015-05-15: qty 1

## 2015-05-15 MED ORDER — EPOETIN ALFA 4000 UNIT/ML IJ SOLN
4000.0000 [IU] | Freq: Once | INTRAMUSCULAR | Status: AC
  Administered 2015-05-15: 4000 [IU] via INTRAVENOUS
  Filled 2015-05-15: qty 1

## 2015-05-15 MED ORDER — SERTRALINE HCL 25 MG PO TABS
12.5000 mg | ORAL_TABLET | Freq: Every day | ORAL | Status: DC
  Administered 2015-05-16: 12.5 mg via ORAL
  Filled 2015-05-15: qty 1

## 2015-05-15 MED ORDER — ONDANSETRON HCL 4 MG PO TABS
4.0000 mg | ORAL_TABLET | Freq: Once | ORAL | Status: AC
  Administered 2015-05-15: 4 mg via ORAL
  Filled 2015-05-15: qty 1

## 2015-05-15 MED ORDER — CINACALCET HCL 30 MG PO TABS
30.0000 mg | ORAL_TABLET | Freq: Every day | ORAL | Status: DC
  Administered 2015-05-16 – 2015-05-18 (×3): 30 mg via ORAL
  Filled 2015-05-15 (×5): qty 1

## 2015-05-15 MED ORDER — VITAMIN D (ERGOCALCIFEROL) 1.25 MG (50000 UNIT) PO CAPS: 50000.0000 [IU] | ORAL_CAPSULE | ORAL | Status: DC

## 2015-05-15 NOTE — Progress Notes (Signed)
Recreation Therapy Notes  Date: 10.18.16 Time: 3:00 pm Location: Craft Room  Group Topic: Goal Setting  Goal Area(s) Addresses:  Patient will write one goal. Patient will verbalize benefit of setting goals.  Behavioral Response: Did not attend  Intervention: Step By Step  Activity: Patients were given a worksheet with a foot on it. Patients were instructed to write a goal on the inside of the foot and write positive words on the outside of the foot.  Education: LRT educated patients on ways the can keep themselves focused on their goals.  Education Outcome: Patient did not attend group.  Clinical Observations/Feedback: Patient did not attend group.  Leonette Monarch, LRT/CTRS 05/15/2015 3:51 PM

## 2015-05-15 NOTE — [facility] Group Notes (Signed)
[facility] LCSW Group Therapy  05/15/2015 2:55 PM  Type of Therapy:  Group Therapy  Participation Level:  Did Not Attend   Keene Breath, MSW, LCSWA 05/15/2015, 2:55 PM

## 2015-05-15 NOTE — Plan of Care (Signed)
Problem: Ineffective individual coping Goal: STG: Patient will remain free from self harm Outcome: Progressing Medication (mirtazapine 15 mg tablet) administered as ordered by the physician, medications Therapeutic Effects, SEs and Adverse effects discussed, questions encouraged; no PRN given, 15 minute checks maintained for safety, clinical and moral support provided, requested for a book to read, patient encouraged to continue to express feelings and demonstrate safe care. Patient remain free from harm, will continue to monitor.

## 2015-05-15 NOTE — Plan of Care (Signed)
Problem: Ineffective individual coping Goal: LTG: Patient will report a decrease in negative feelings Outcome: Progressing Patient states that she feels better today, denies thoughts to harm self.

## 2015-05-15 NOTE — BHH Group Notes (Signed)
[place] Group Notes:  (Nursing/MHT/Case Management/Adjunct)  Date:  05/15/2015  Time:  1:51 PM  Type of Therapy:  Psychoeducational Skills  Participation Level:  Did Not Attend  P  Drake Leach 05/15/2015, 1:51 PM

## 2015-05-15 NOTE — H&P (Signed)
Psychiatric Admission Assessment Adult  Patient Identification: [name] MRN:  [id number] Date of Evaluation:  05/15/2015 Chief Complaint:  depression Principal Diagnosis: Major depressive disorder, single episode, severe without psychotic features ([place]) Diagnosis:   Patient Active Problem List   Diagnosis Date Noted  . Major depressive disorder, single episode, severe without psychotic features ([place]) [F32.2] 05/15/2015  . Spina bifida ([place]) [Q05.9] 01/18/2012  . ESRD (end stage renal disease) on dialysis ([place]) [N18.6, Z99.2] 05/02/2011  . Anemia [D64.9] 05/02/2011  . HTN (hypertension) [I10] 05/02/2011   History of Present Illness: [name] is a 24 y.o. female who presented to the Emergency Department [facility] on 10/13. Per cousin, pt was found unresponsive on the bathroom floor with a pill bottle of 25 mg Amitriptyline on the floor next to her. Cousin notes pt did not tell her how many pills she took, and that the pt wrote an apologetic post on facebook prior to the incident. Per relatives this has not happened before.  Patient  has a PMHx of spina bifida (wheelchair dependent), HTN, and end-stage renal failure on dialysis.   While in the ED, patient was tachycardic on arrival which resolved on its own. She had no evidence of wide QRS complexes. Labs were unremarkable.   In the emergency room patient stated "I am just tired of fighting--I didn't ask to be in a wheelchair, and it's not fair. All anyone sees is my wheelchair, and not me. I am trying to get a job, and I can't because people don't think I can do things".   Pt states that her job/financial situation is stressful,in addition to her living situation. She currently lives with her aunt, but they are looking for a place and in the mean time have to stay with her GM. "There are a lot of people who live there, and I am the only one she asks to help pay, and I don't have a job".  Pt states she had another OD attempt 2  years ago, but no one knew because she vomited the pills right after.   Patient reports depressive symptoms for several years. She believes the symptoms started after she was removed from the custody of her mother due to concerns of neglect.  Around the age of 3 she was placed in foster care for about a year along with her sisters.  Her living situation has been on a stable ever since.  2 months before turning 20 the patient returned to live with her mother but is states they just didn't get along. During the time that the patient was staying with her mother her mother divorced and  became homeless. The patient and her sisters were staying in hotels.   She left and moved in with her aunt.  Soon after that her aunt went through a divorce and he became homeless.  The patient and her aunt moved into the patient's grandmother's house.  The patient is states there are multiple people living in the house; there are 3 adult cousins who live there with their boyfriends and girlfriends, 3 cousins who are minors in addition to the patient her aunt and grandmother.  Patient explains that her grandmother is only asking the patient and the aunt to pay bills but  not be others living in the house despite them having a job.  Therefore the patient's aunt was trying to get a trailer ready and was going to take the patient to live with her.  Right before the  overdose the patient's aunt and a grandmother got into an argument about the bills. That evening the patient took 8 tablets of Elavil in an attempt to stop her thoughts and go to sleep. Patient thinks and feels that all of these bad things that had happened to her mother and aunt are because of her. The patient was found unconscious by a cousin that had to breaking into the bathroom of the house.    Patient also states that she has been trying to get a part-time job. She has applied to a multitude of locations such as restaurants and [employer].  Patient states  she recently interviewed at a new restaurant along with her cousins. All of the cousins were hired  but not her.  As far as symptoms of depression she describes having depressed mood, decreased appetite, anhedonia, insomnia and thoughts of hopelessness and worthlessness. States that prior to the overdose she did have suicidal thoughts but denies having suicidal thoughts today. Denies HI or auditory or visual hallucinations.   Substance abuse history: Denies use of tobacco alcohol or illicit substances.    Associated Signs/Symptoms: Depression Symptoms:  depressed mood, anhedonia, insomnia, fatigue, hopelessness, suicidal attempt, increased appetite, (Hypo) Manic Symptoms:  none Anxiety Symptoms:  none Psychotic Symptoms:  none PTSD Symptoms: Negative Total Time spent with patient: 1 hour  Past Psychiatric History: Patient denies having any prior psychiatric treatment. Denies prior trials with antidepressants or any other psychotropics.  Denies history of self-injurious behaviors. She did have a prior suicidal attempt by overdosing on Benadryl about 2 years ago. Nobody in her family knew as she vomited the pills immediately after and never told anybody.  Risk to Self: Is patient at risk for suicide?: Yes Risk to Others:  no Prior Inpatient Therapy:  no Prior Outpatient Therapy:  no  Alcohol Screening: 1. How often do you have a drink containing alcohol?: Never 9. Have you or someone else been injured as a result of your drinking?: No 10. Has a relative or friend or a doctor or another health worker been concerned about your drinking or suggested you cut down?: No Alcohol Use Disorder Identification Test Final Score (AUDIT): 0 Brief Intervention: AUDIT score less than 7 or less-screening does not suggest unhealthy drinking-brief intervention not indicated  Past Medical History: Spina bifida-wheelchair dependent. Endstage renal failure with dialysis. Hypertension.  No history of  surgical procedures, head trauma or seizures. Past Medical History  Diagnosis Date  . Spina bifida   . UTI (lower urinary tract infection)   . HTN (hypertension) 05/02/2011  . Anemia associated with chronic renal failure   . 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  . Blood transfusion   . Dialysis care   . ESRD (end stage renal disease) on dialysis ([place])   . Depression 05/14/2015    Past Surgical History  Procedure Laterality Date  . Av fistula placement      left arm   Family History:  Family History  Problem Relation Age of Onset  . Kidney cancer Other    Family Psychiatric  History: Patient reports that her biological father committed suicide when the patient was 24 years old.  Social History: Patient is single, never married.  Pt states she has a HS education and gets disability. Currently is staying at her grandmother's house but planning to move out soon to stay with her aunt in a trailer.  Denies any legal charges. Patient has never held a job.  History  Alcohol Use No     History  Drug Use No    Social History   Social History  . Marital Status: Single    Spouse Name: N/A  . Number of Children: N/A  . Years of Education: N/A   Social History Main Topics  . Smoking status: Never Smoker   . Smokeless tobacco: None  . Alcohol Use: No  . Drug Use: No  . Sexual Activity: No   Other Topics Concern  . None   Social History Narrative    Allergies:   Allergies  Allergen Reactions  . Ciprofloxacin Shortness Of Breath, Nausea And Vomiting and Other (See Comments)    HIGH FEVER  . Other Anaphylaxis    Revaclear dialzer  . Peanut-Containing Drug Products Anaphylaxis  . Aleve [Naproxen Sodium] Other (See Comments)    G.I.Bleed  . Influenza Vaccines Nausea And Vomiting    High fever  . Tetanus Toxoids Nausea And Vomiting and Other (See Comments)    HIGH FEVER  . Latex Itching and Rash   Lab Results:  Results for orders placed or performed  during the hospital encounter of 05/10/15 (from the past 48 hour(s))  Phosphorus     Status: Abnormal   Collection Time: 05/14/15  6:12 AM  Result Value Ref Range   Phosphorus 6.5 (H) 2.5 - 4.6 mg/dL  Basic metabolic panel     Status: Abnormal   Collection Time: 05/14/15  6:12 AM  Result Value Ref Range   Sodium 136 135 - 145 mmol/L   Potassium 4.1 3.5 - 5.1 mmol/L   Chloride 95 (L) 101 - 111 mmol/L   CO2 28 22 - 32 mmol/L   Glucose, Bld 81 65 - 99 mg/dL   BUN 42 (H) 6 - 20 mg/dL   Creatinine, Ser 6.10 (H) 0.44 - 1.00 mg/dL   Calcium 9.9 8.9 - 10.3 mg/dL   GFR calc non Af Amer 9 (L) >60 mL/min   GFR calc Af Amer 10 (L) >60 mL/min    Comment: (NOTE) The eGFR has been calculated using the CKD EPI equation. This calculation has not been validated in all clinical situations. eGFR's persistently <60 mL/min signify possible Chronic Kidney Disease.    Anion gap 13 5 - 15    Metabolic Disorder Labs:  No results found for: HGBA1C, MPG No results found for: PROLACTIN No results found for: CHOL, TRIG, HDL, CHOLHDL, VLDL, LDLCALC  Current Medications: Current Facility-Administered Medications  Medication Dose Route Frequency Provider Last Rate Last Dose  . acetaminophen (TYLENOL) tablet 650 mg  650 mg Oral Q6H PRN Marjie Skiff, MD      . b complex-vitamin c-folic acid (NEPHRO-VITE) tablet 1 tablet  1 tablet Oral Daily Hildred Priest, MD   1 tablet at 05/15/15 1155  . cinacalcet (SENSIPAR) tablet 30 mg  30 mg Oral Q breakfast Hildred Priest, MD   30 mg at 05/15/15 1155  . hydrOXYzine (ATARAX/VISTARIL) tablet 50 mg  50 mg Oral TID PRN Marjie Skiff, MD      . mirtazapine (REMERON) tablet 15 mg  15 mg Oral QHS Hildred Priest, MD      . sertraline (ZOLOFT) tablet 12.5 mg  12.5 mg Oral Daily Hildred Priest, MD      . sevelamer carbonate (RENVELA) tablet 800 mg  800 mg Oral TID WC Hildred Priest, MD   800 mg at 05/15/15 1156  .  [START ON 05/21/2015] Vitamin D (Ergocalciferol) (DRISDOL) capsule 50,000 Units  50,000 Units Oral Q7 days Hildred Priest, MD       PTA Medications: Prescriptions prior to admission  Medication Sig Dispense Refill Last Dose  . acetaminophen (TYLENOL) 500 MG tablet Take 1,000 mg by mouth every 6 (six) hours as needed for moderate pain or headache.   unknown  . ALPRAZolam (XANAX) 0.25 MG tablet Take 1 tablet by mouth daily as needed. Before Dialysis and as needed.   Past Week at Unknown time  . cinacalcet (SENSIPAR) 30 MG tablet Take 30 mg by mouth daily.   05/10/2015 at Unknown time  . Epoetin Alfa (EPOGEN IJ) Inject as directed. Pt gets on dialysis days which are Tuesday, Thursday, and Saturday.   05/10/2015 at Unknown time  . multivitamin (RENA-VIT) TABS tablet Take 1 tablet by mouth daily.   05/10/2015 at Unknown time  . sevelamer carbonate (RENVELA) 800 MG tablet Take 800 mg by mouth 3 (three) times daily with meals.    05/10/2015 at Unknown time  . Vitamin D, Ergocalciferol, (DRISDOL) 50000 UNITS CAPS capsule Take 50,000 Units by mouth every 7 (seven) days. Takes on Monday.   Past Week at Unknown time    Musculoskeletal: Strength & Muscle Tone: within normal limits Gait & Station: normal Patient leans: N/A  Psychiatric Specialty Exam: Physical Exam  Constitutional: She is oriented to person, place, and time. She appears well-developed and well-nourished.  HENT:  Head: Normocephalic and atraumatic.  Eyes: Conjunctivae and EOM are normal.  Neck: Normal range of motion.  Respiratory: Effort normal.  Musculoskeletal: Normal range of motion.  Neurological: She is alert and oriented to person, place, and time.  Skin: Skin is warm and dry.    Review of Systems  Constitutional: Negative.   HENT: Negative.   Eyes: Negative.   Respiratory: Negative.   Cardiovascular: Negative.   Gastrointestinal: Negative.   Genitourinary: Negative.   Musculoskeletal: Negative.   Skin:  Negative.   Neurological: Negative.   Endo/Heme/Allergies: Negative.   Psychiatric/Behavioral: Negative.     Blood pressure 102/56, pulse 98, temperature 98.2 F (36.8 C), temperature source Oral, resp. rate 18, height 3' (0.914 m), weight 28.8 kg (63 lb 7.9 oz), SpO2 100 %.Body mass index is 34.47 kg/(m^2).  General Appearance: Fairly Groomed, patient sitting on the bed. Lower extremities are significantly atrophic   Eye Contact::  Good  Speech:  Clear and Coherent  Volume:  Decreased  Mood:  Dysphoric  Affect:  Appropriate and Congruent  Thought Process:  Linear and Logical  Orientation:  Full (Time, Place, and Person)  Thought Content:  Hallucinations: None  Suicidal Thoughts:  No  Homicidal Thoughts:  No  Memory:  Immediate;   Good Recent;   Good Remote;   Good  Judgement:  Fair  Insight:  Fair  Psychomotor Activity:  Decreased  Concentration:  Good  Recall:  NA  Fund of Knowledge:Good  Language: Good  Akathisia:  No  Handed:    AIMS (if indicated):     Assets:  Agricultural consultant Social Support  ADL's:  Intact  Cognition: WNL  Sleep:  Number of Hours: 6.75     Treatment Plan Summary: Daily contact with patient to assess and evaluate symptoms and progress in treatment and Medication management   24 year old single African-American female with spina bifida and end-stage renal failure who was admitted to our facility after overdosing on amitriptyline.  As the stressors patient reports on a stable living situation, conflict with her grandmother, and inability to find a job due  to her physical disabilities. Patient voices symptoms consistent with a major depressive disorder.  Major depressive disorder: Patient will be started on sertraline 12.5 mg by mouth daily and mirtazapine 15 mg by mouth daily at bedtime.  Insomnia: I will target insomnia and frequent nausea related to dialysis with mirtazapine 15 mg by mouth daily at  bedtime.  End-stage renal failure: Nephrology will be consulted as patient is in need of dialysis 3 times a week on Tuesdays, Thursdays and Saturdays.  Hypertension: Reported history of hypertension however currently vital signs are within the normal limits.  Patient was not on any antihypertensives prior to admission.  Labs: I we'll order vitamin B12 and TSH if not already checked.  Diet: Low sodium  Precautions: Every 15 minute checks  Hospitalization and status: Continue involuntary commitment  Discharge disposition: Once stable the patient will be discharged back to her family. She will be scheduled to follow-up with outpatient psychiatric services. We will also make a referral for vocational rehabilitation.   I certify that inpatient services furnished can reasonably be expected to improve the patient's condition.   Hildred Priest 10/18/201612:18 PM

## 2015-05-15 NOTE — Progress Notes (Signed)
Patient remains in dialysis.

## 2015-05-15 NOTE — BHH Suicide Risk Assessment (Signed)
[facility] Admission Suicide Risk Assessment   Nursing information obtained from:  Review of record Demographic factors:  Adolescent or young adult, Unemployed Current Mental Status:  Suicidal ideation indicated by patient Loss Factors:  Decline in physical health Historical Factors:  Prior suicide attempts Risk Reduction Factors:  Living with another person, especially a relative Total Time spent with patient: 1 hour Principal Problem: Major depressive disorder, single episode, severe without psychotic features ([place]) Diagnosis:   Patient Active Problem List   Diagnosis Date Noted  . Major depressive disorder, single episode, severe without psychotic features ([place]) [F32.2] 05/15/2015  . Spina bifida ([place]) [Q05.9] 01/18/2012  . ESRD (end stage renal disease) on dialysis ([place]) [N18.6, Z99.2] 05/02/2011  . Anemia [D64.9] 05/02/2011  . HTN (hypertension) [I10] 05/02/2011     Continued Clinical Symptoms:  Alcohol Use Disorder Identification Test Final Score (AUDIT): 0 The "Alcohol Use Disorders Identification Test", Guidelines for Use in Primary Care, Second Edition.  World Pharmacologist [facility]). Score between 0-7:  no or low risk or alcohol related problems. Score between 8-15:  moderate risk of alcohol related problems. Score between 16-19:  high risk of alcohol related problems. Score 20 or above:  warrants further diagnostic evaluation for alcohol dependence and treatment.   CLINICAL FACTORS:   Depression:   Severe Medical Diagnoses and Treatments/Surgeries  Psychiatric Specialty Exam: Physical Exam  ROS    COGNITIVE FEATURES THAT CONTRIBUTE TO RISK:  None    SUICIDE RISK:   Moderate:  Frequent suicidal ideation with limited intensity, and duration, some specificity in terms of plans, no associated intent, good self-control, limited dysphoria/symptomatology, some risk factors present, and identifiable protective factors, including available and accessible social  support.  PLAN OF CARE: admit to [place] Making:  Established Problem, Worsening (2)  I certify that inpatient services furnished can reasonably be expected to improve the patient's condition.   Hildred Priest 05/15/2015, 12:44 PM

## 2015-05-15 NOTE — Progress Notes (Signed)
Post hd tx 

## 2015-05-15 NOTE — Progress Notes (Signed)
Saline bolus given

## 2015-05-15 NOTE — Psychosocial Assessment (Signed)
Patient alert and oriented, patient states " I do feel better" denies any triggers that caused depression, Patient denies plan to harm self. Patient did go to lunch, has poor appetite. Patient with orders for dialysis. Patient states she thinks that she has UTI, states " I saw mucous in urine" Charge nurse let MD know. Patient up in w/c and went to dialysis with assist.

## 2015-05-15 NOTE — Progress Notes (Signed)
HD tx completed.

## 2015-05-15 NOTE — Tx Team (Signed)
Initial Interdisciplinary Treatment Plan   PATIENT STRESSORS: Health problems   PATIENT STRENGTHS: Ability for insight Communication skills General fund of knowledge   PROBLEM LIST: Problem List/Patient Goals Date to be addressed Date deferred Reason deferred Estimated date of resolution  Depression 05/15/15     Suicidal Ideation 05/15/15                                                DISCHARGE CRITERIA:  Improved stabilization in mood, thinking, and/or behavior  PRELIMINARY DISCHARGE PLAN: Outpatient therapy  PATIENT/FAMIILY INVOLVEMENT: This treatment plan has been presented to and reviewed with the patient, [name], and/or family member.  The patient and family have been given the opportunity to ask questions and make suggestions.  Nash Mantis [facility] 05/15/2015, 6:09 AM

## 2015-05-15 NOTE — Progress Notes (Signed)
Recreation Therapy Notes  INPATIENT RECREATION THERAPY ASSESSMENT  Patient Details Name: NAIJA NERIO MRN: [id number] DOB: [birth year] Today's Date: 05/15/2015  Patient Stressors: Family, Work (Mom wants patient to live with her, but patient has been living with aunt and cousin and likes it; patient has been trying to find a job, but is having difficulties due to being in a wheelchair)  Coping Skills:   Isolate, Avoidance, Art/Dance, Music, Sports, Other (Comment) (Go outside, sleep)  Personal Challenges: Anger, Communication, Concentration, Decision-Making, Expressing Yourself, Problem-Solving, Relationships, Self-Esteem/Confidence, Social Interaction, Stress Management, Trusting Others  Leisure Interests (2+):  Individual - Other (Comment), Community - Movies (Be with cousin)  Awareness of Community Resources:  Yes  Community Resources:  Library, Other (Comment) ([place])  Current Use: No  If no, Barriers?: Other (Comment) (Time)  Patient Strengths:  Hair  Patient Identified Areas of Improvement:  Feeling comfortable around others  Current Recreation Participation:  Painting  Patient Goal for Hospitalization:  Not sure  [place] of Residence:  [place] of Residence:  [place]   Current [place] (including self-harm):  No  Current [place]:  No  Consent to Intern Participation: N/A   Leonette Monarch, LRT/CTRS 05/15/2015, 1:57 PM

## 2015-05-15 NOTE — Progress Notes (Signed)
Pre-hd tx 

## 2015-05-15 NOTE — Consult Note (Signed)
Date: 05/15/2015                  Patient Name:  [name]  MRN: [id number]  DOB: 11/05/1990  Age / Sex: 24 y.o., female         PCP: No PCP Per Patient                 Service Requesting Consult:  psychiatry                  Reason for Consult: ESRD, arrange for HD            History of Present Illness: Patient is a 24 y.o. female with medical problems of major depressive disorder, spina bifida, anemia, hypertension, end-stage renal disease, who was admitted to [facility] on 05/14/2015 for evaluation of major depressive disorder.  Nephrology is consulted to arrange for dialysis. 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 She has been on dialysis for the past 6 years. She dialyzes in [place]. She is followed by Dr. Hinda Lenis. She dialyzes Tuesday, Thursday, Saturday for 6 hours. Sometimes she had hypotension during treatment Her binders are Renvela one tablet with each meal Patient is currently seen on dialysis. She is tolerating well. She has a left arm AV fistula   Medications: Outpatient medications: Prescriptions prior to admission  Medication Sig Dispense Refill Last Dose  . acetaminophen (TYLENOL) 500 MG tablet Take 1,000 mg by mouth every 6 (six) hours as needed for moderate pain or headache.   unknown  . ALPRAZolam (XANAX) 0.25 MG tablet Take 1 tablet by mouth daily as needed. Before Dialysis and as needed.   Past Week at Unknown time  . cinacalcet (SENSIPAR) 30 MG tablet Take 30 mg by mouth daily.   05/10/2015 at Unknown time  . Epoetin Alfa (EPOGEN IJ) Inject as directed. Pt gets on dialysis days which are Tuesday, Thursday, and Saturday.   05/10/2015 at Unknown time  . multivitamin (RENA-VIT) TABS tablet Take 1 tablet by mouth daily.   05/10/2015 at Unknown time  . sevelamer carbonate (RENVELA) 800 MG tablet Take 800 mg by mouth 3 (three) times daily with meals.    05/10/2015 at Unknown time  . Vitamin D,  Ergocalciferol, (DRISDOL) 50000 UNITS CAPS capsule Take 50,000 Units by mouth every 7 (seven) days. Takes on Monday.   Past Week at Unknown time    Current medications: Current Facility-Administered Medications  Medication Dose Route Frequency Provider Last Rate Last Dose  . acetaminophen (TYLENOL) tablet 650 mg  650 mg Oral Q6H PRN Marjie Skiff, MD      . b complex-vitamin c-folic acid (NEPHRO-VITE) tablet 1 tablet  1 tablet Oral Daily Hildred Priest, MD   1 tablet at 05/15/15 1155  . cinacalcet (SENSIPAR) tablet 30 mg  30 mg Oral Q breakfast Hildred Priest, MD   30 mg at 05/15/15 1155  . epoetin alfa (EPOGEN,PROCRIT) injection 4,000 Units  4,000 Units Intravenous Once Perry Molla, MD      . hydrOXYzine (ATARAX/VISTARIL) tablet 50 mg  50 mg Oral TID PRN Marjie Skiff, MD      . mirtazapine (REMERON) tablet 15 mg  15 mg Oral QHS Hildred Priest, MD      . ondansetron [facility]) tablet 4 mg  4 mg Oral Q8H PRN Hildred Priest, MD      . sertraline (ZOLOFT) tablet 12.5 mg  12.5 mg Oral Daily Hildred Priest, MD   12.5  mg at 05/15/15 1411  . sevelamer carbonate (RENVELA) tablet 800 mg  800 mg Oral TID WC Hildred Priest, MD   800 mg at 05/15/15 1156  . [START ON 05/21/2015] Vitamin D (Ergocalciferol) (DRISDOL) capsule 50,000 Units  50,000 Units Oral Q7 days Hildred Priest, MD          Allergies: Allergies  Allergen Reactions  . Ciprofloxacin Shortness Of Breath, Nausea And Vomiting and Other (See Comments)    HIGH FEVER  . Other Anaphylaxis    Revaclear dialzer  . Peanut-Containing Drug Products Anaphylaxis  . Aleve [Naproxen Sodium] Other (See Comments)    G.I.Bleed  . Influenza Vaccines Nausea And Vomiting    High fever  . Tetanus Toxoids Nausea And Vomiting and Other (See Comments)    HIGH FEVER  . Latex Itching and Rash      Past Medical History: Past Medical History  Diagnosis Date  . Spina bifida    . UTI (lower urinary tract infection)   . HTN (hypertension) 05/02/2011  . Anemia associated with chronic renal failure   . 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  . Blood transfusion   . Dialysis care   . ESRD (end stage renal disease) on dialysis ([place])   . Depression 05/14/2015     Past Surgical History: Past Surgical History  Procedure Laterality Date  . Av fistula placement      left arm     Family History: Family History  Problem Relation Age of Onset  . Kidney cancer Other      Social History: Social History   Social History  . Marital Status: Single    Spouse Name: N/A  . Number of Children: N/A  . Years of Education: N/A   Occupational History  . Not on file.   Social History Main Topics  . Smoking status: Never Smoker   . Smokeless tobacco: Not on file  . Alcohol Use: No  . Drug Use: No  . Sexual Activity: No   Other Topics Concern  . Not on file   Social History Narrative     Review of Systems: Gen: No complaints HEENT: No complaints CV: Heart rate is noted to be high, occasionally she has palpitations Resp: No cough or sputum, no complaints GI: No complaints GU : Patient still makes urine MS: Because of her congenital condition, she gets around with a chair Derm:  No acute rashes Psych: Depressive disorder. No complaints at present Heme: No complaints Neuro: No complaints Endocrine no complaints  Vital Signs: Blood pressure 116/77, pulse 137, temperature 98 F (36.7 C), temperature source Oral, resp. rate 16, height 3' (0.914 m), weight 28.8 kg (63 lb 7.9 oz), SpO2 100 %.   Intake/Output Summary (Last 24 hours) at 05/15/15 1617 Last data filed at 05/15/15 1253  Gross per 24 hour  Intake    600 ml  Output      0 ml  Net    600 ml    Weight trends: [employer]   05/15/15 0559  Weight: 28.8 kg (63 lb 7.9 oz)    Physical Exam: General:  no acute distress, sitting in a chair   HEENT  anicteric, moist  mucous membranes   Neck:  supple, no masses   Lungs:  normal respiratory effort, clear to auscultation   Heart::  tachycardic, regular, no rub heart rate 130s on the monitor   Abdomen:  soft, nontender   Extremities:  congenital deformities , rt  leg femoral area only, deformed, 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  Neurologic:  alert, oriented, able to communicate normally   Skin:  no acute rashes   Access:  left upper arm AV fistula   Foley:        Lab results: Basic Metabolic Panel:  Recent Labs Lab 05/10/15 2319 05/12/15 1600 05/14/15 0612  NA 131* 135 136  K 3.1* 3.8 4.1  CL 95* 103 95*  CO2 28 22 28   GLUCOSE 108* 78 81  BUN 17 31* 42*  CREATININE 3.46* 5.60* 6.10*  CALCIUM 8.5* 8.6* 9.9  PHOS  --  4.2 6.5*    Liver Function Tests:  Recent Labs Lab 05/10/15 2319 05/12/15 1600  AST 21  --   ALT 13*  --   ALKPHOS 108  --   BILITOT 0.5  --   PROT 7.2  --   ALBUMIN 3.7 3.5   No results for input(s): LIPASE, AMYLASE in the last 168 hours. No results for input(s): AMMONIA in the last 168 hours.  CBC:  Recent Labs Lab 05/10/15 2319 05/12/15 1600  WBC 6.3 4.7  NEUTROABS 3.6  --   HGB 10.5* 9.9*  HCT 33.2* 31.1*  MCV 96.5 96.0  PLT 163 155    Cardiac Enzymes: No results for input(s): CKTOTAL, TROPONINI in the last 168 hours.  BNP: Invalid input(s): POCBNP  CBG: No results for input(s): GLUCAP in the last 168 hours.  Microbiology: No results found for this or any previous visit (from the past 720 hour(s)).   Coagulation Studies: No results for input(s): LABPROT, INR in the last 72 hours.  Urinalysis: No results for input(s): COLORURINE, LABSPEC, PHURINE, GLUCOSEU, HGBUR, BILIRUBINUR, KETONESUR, PROTEINUR, UROBILINOGEN, NITRITE, LEUKOCYTESUR in the last 72 hours.  Invalid input(s): APPERANCEUR    Imaging:  No results found.   Assessment & Plan: Pt is a 24 y.o. yo female with a PMHX of spina bifida, ESRD, anemia, hypertension, major depressive disorder,  was admitted on 05/14/2015 with acute depression.   1. End-stage renal disease. [place] dialysis. Tuesday/Thursday/Saturday. Dr Hinda Lenis 2. Anemia of chronic kidney disease 3. Secondary hyperparathyroidism Plan: Dialysis on regular days T/T/S 3 hour treatment, minimal UF Continue Renvela, Sensipar Obtain TSH and free T4 to evaluate tachycardia

## 2015-05-15 NOTE — Progress Notes (Signed)
Patient in room, responded to knock and prompt, A&Ox3, has great insight, "I took too many medications to go to sleep forever, I was found, passed out on the bathroom floor, by my cousin who brought me to the hospital. The doctor let me sleep it off and I threw up the rest; I have been depressed since 2009, partly, due to physical disability."

## 2015-05-15 NOTE — Progress Notes (Signed)
Patient admitted IVC after overdose on an unspecified amount of Amitriptyline.  Patient was found unresponsive.  Patient has spina bifida which is her major stressor.  She left a message on Facebook stating she was "tired of fighting."  Patient search performed.  No contraband found.

## 2015-05-16 LAB — HEPATITIS B SURFACE ANTIBODY, QUANTITATIVE: HEPATITIS B-POST: 41.3 m[IU]/mL

## 2015-05-16 LAB — VITAMIN B12: Vitamin B-12: 661 pg/mL (ref 180–914)

## 2015-05-16 LAB — HEPATITIS B SURFACE ANTIGEN: HEP B S AG: NEGATIVE

## 2015-05-16 MED ORDER — WHITE PETROLATUM GEL
Status: DC | PRN
  Filled 2015-05-16: qty 5

## 2015-05-16 NOTE — Plan of Care (Signed)
Problem: Diagnosis: Increased Risk For Suicide Attempt Goal: LTG-Patient Will Show Positive Response to Medication LTG (by discharge) : Patient will show positive response to medication and will participate in the development of the discharge plan.  Outcome: Not Progressing Patient remains isolated to room.

## 2015-05-16 NOTE — Progress Notes (Signed)
D: Patient stated slept good last night .Stated appetite is fairand energy level  Is normal. Stated concentration is good . Stated on Depression scale 0 , hopeless0 and anxiety 0 .( low 0-10 high) Denies suicidal  homicidal ideations  .  No auditory hallucinations  No pain concerns . Appropriate ADL'S. Interacting with peers and staff.  Patient voice wanting to go home . 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  A: Encourage patient participation with unit programming . Instruction  Given on  Medication , verbalize understanding. Encourage patient to take classes  Learning the art of cooking   R: Voice no other concerns. Staff continue to monitor

## 2015-05-16 NOTE — BHH Group Notes (Signed)
[place] Group Notes:  (Nursing/MHT/Case Management/Adjunct)  Date:  05/16/2015  Time:  2:31 PM  Type of Therapy:  Psychoeducational Skills  Participation Level:  Did Not Attend   [name] [facility] 05/16/2015, 2:31 PM

## 2015-05-16 NOTE — Progress Notes (Signed)
Recreation Therapy Notes  Date: 10.19.16 Time: 3:00 pm Location: Craft Room  Group Topic: Self-esteem  Goal Area(s) Addresses:  Patient will identify positive attributes about self. Patient will identify at least one healthy coping skill.  Behavioral Response: Attentive  Intervention: All About Me  Activity: Patients were instructed to make an All About Me pamphlet listing their life's motto, positive traits, healthy coping skills, and their healthy support system.  Education: LRT educated patient on ways they can increase their self-esteem   Education Outcome: In group clarification offered  Clinical Observations/Feedback: Patient completed activity by writing her life's motto, positive traits about herself, healthy coping skills, and her healthy support system. Patient did not contribute to group discussion.  Leonette Monarch, LRT/CTRS 05/16/2015 4:33 PM

## 2015-05-16 NOTE — BHH Group Notes (Signed)
[facility] LCSW Aftercare Discharge Planning Group Note   05/16/2015 11:15 AM  Participation Quality:  Active  Mood/Affect:  Appropriate  Thoughts of Suicide:  NA Will you contract for safety?   NA  Current AVH:  NA  Plan for Discharge/Comments:  Patient actively and appropriately participated in the session. Patient shared that her goal was to speak to her assigned Clinical Social Worker and Physician about discharge planning.   Transportation Means: Patient was informed about different transportation resources.  Supports: Patient was informed about community resources.   Christa See, Clinical Social Work Intern 05/16/15  Carmell Austria, MSW, Latanya Presser 05/17/15

## 2015-05-16 NOTE — Progress Notes (Signed)
[facility] MD Progress Note  05/16/2015 11:58 AM [name]  MRN:  [id number] Subjective:  Patient was found in bed with the lights off. She was sleeping late in the morning.  Patient appeared more depressed and just today during initial assessment. His speech had decreased volume and production. Thought processes: her answers were very vague.  She displays psychomotor retardation and reported feeling very tired.  She denied having problems with sleep, appetite, energy or concentration.  Patient denied SI, HI or having auditory or visual hallucinations.  Patient denied having physical complaints or side effects from medications. She had dialysis yesterday without complications.  Per nursing: Patient in room, responded to knock and prompt, A&Ox3, has great insight, "I took too many medications to go to sleep forever, I was found, passed out on the bathroom floor, by my cousin who brought me to the hospital. The doctor let me sleep it off and I threw up the rest; I have been depressed since 2009, partly, due to physical disability."  Principal Problem: Major depressive disorder, single episode, severe without psychotic features ([place]) Diagnosis:   Patient Active Problem List   Diagnosis Date Noted  . Major depressive disorder, single episode, severe without psychotic features ([place]) [F32.2] 05/15/2015  . Spina bifida ([place]) [Q05.9] 01/18/2012  . ESRD (end stage renal disease) on dialysis ([place]) [N18.6, Z99.2] 05/02/2011  . Anemia [D64.9] 05/02/2011  . HTN (hypertension) [I10] 05/02/2011   Total Time spent with patient: 30 minutes  Past Psychiatric History: Patient denies having any prior psychiatric treatment. Denies prior trials with antidepressants or any other psychotropics. Denies history of self-injurious behaviors. She did have a prior suicidal attempt by overdosing on Benadryl about 2 years ago. Nobody in her family knew as she vomited the pills immediately after and never told anybody.  Risk  to Self: Is patient at risk for suicide?: Yes Risk to Others:  no Prior Inpatient Therapy:  no Prior Outpatient Therapy:  no  Alcohol Screening: 1. How often do you have a drink containing alcohol?: Never 9. Have you or someone else been injured as a result of your drinking?: No 10. Has a relative or friend or a doctor or another health worker been concerned about your drinking or suggested you cut down?: No Alcohol Use Disorder Identification Test Final Score (AUDIT): 0 Brief Intervention: AUDIT score less than 7 or less-screening does not suggest unhealthy drinking-brief intervention not indicated  Past Medical History: Spina bifida-wheelchair dependent. Endstage renal failure with dialysis. Hypertension. No history of surgical procedures, head trauma or seizures. Past Medical History  Diagnosis Date  . Spina bifida   . UTI (lower urinary tract infection)   . HTN (hypertension) 05/02/2011  . Anemia associated with chronic renal failure   . 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  . Blood transfusion   . Dialysis care   . ESRD (end stage renal disease) on dialysis ([place])   . Depression 05/14/2015    Past Surgical History  Procedure Laterality Date  . Av fistula placement      left arm   Family History:  Family History  Problem Relation Age of Onset  . Kidney cancer Other    Family Psychiatric History: Patient reports that her biological father committed suicide when the patient was 24 years old.  Social History: Patient is single, never married. Pt states she has a HS education and gets disability. Currently is staying at her grandmother's house but planning to move out soon  to stay with her aunt in a trailer. Denies any legal charges. Patient has never held a job. History  Alcohol Use No    History  Drug Use No    Social History   Social History  . Marital Status: Single    Spouse  Name: N/A  . Number of Children: N/A  . Years of Education: N/A   Social History Main Topics  . Smoking status: Never Smoker   . Smokeless tobacco: None  . Alcohol Use: No  . Drug Use: No  . Sexual Activity: No        Sleep: Good  Appetite:  Good  Current Medications: Current Facility-Administered Medications  Medication Dose Route Frequency Provider Last Rate Last Dose  . acetaminophen (TYLENOL) tablet 650 mg  650 mg Oral Q6H PRN Marjie Skiff, MD      . b complex-vitamin c-folic acid (NEPHRO-VITE) tablet 1 tablet  1 tablet Oral Daily Hildred Priest, MD   1 tablet at 05/16/15 0936  . cinacalcet (SENSIPAR) tablet 30 mg  30 mg Oral Q breakfast Hildred Priest, MD   30 mg at 05/16/15 0749  . hydrOXYzine (ATARAX/VISTARIL) tablet 50 mg  50 mg Oral TID PRN Marjie Skiff, MD      . mirtazapine (REMERON) tablet 15 mg  15 mg Oral QHS Hildred Priest, MD   15 mg at 05/15/15 2132  . ondansetron (ZOFRAN) tablet 4 mg  4 mg Oral Q8H PRN Hildred Priest, MD      . sertraline (ZOLOFT) tablet 12.5 mg  12.5 mg Oral Daily Hildred Priest, MD   12.5 mg at 05/16/15 0937  . sevelamer carbonate (RENVELA) tablet 800 mg  800 mg Oral TID WC Hildred Priest, MD   800 mg at 05/16/15 [id number]    Lab Results:  Results for orders placed or performed during the hospital encounter of 05/14/15 (from the past 48 hour(s))  Hepatitis B surface antibody     Status: None   Collection Time: 05/15/15  3:23 PM  Result Value Ref Range   Hepatitis B-Post 41.3 Immunity>9.9 mIU/mL    Comment: (NOTE)  Status of Immunity                     Anti-HBs Level  ------------------                     -------------- Inconsistent with Immunity                   0.0 - 9.9 Consistent with Immunity                          >9.9 Performed At: [facility] [place], [place] [id number] Lindon Romp MD [id number]    TSH     Status: None   Collection Time: 05/15/15  3:23 PM  Result Value Ref Range   TSH 0.867 0.350 - 4.500 uIU/mL  T4, free     Status: None   Collection Time: 05/15/15  3:23 PM  Result Value Ref Range   Free T4 0.85 0.61 - 1.12 ng/dL    Physical Findings: AIMS: Facial and Oral Movements Muscles of Facial Expression: None, normal Lips and Perioral Area: None, normal Jaw: None, normal Tongue: None, normal,Extremity Movements Upper (arms, wrists, hands, fingers): None, normal Lower (legs, knees, ankles, toes): None, normal, Trunk Movements Neck, shoulders, hips: None, normal, Overall Severity Severity of abnormal movements (highest  score from questions above): None, normal Incapacitation due to abnormal movements: None, normal Patient's awareness of abnormal movements (rate only patient's report): No Awareness, Dental Status Current problems with teeth and/or dentures?: No Does patient usually wear dentures?: No  CIWA:    COWS:     Musculoskeletal: Strength & Muscle Tone: Within the normal limits on her upper extremities and atrophic on her lower extremities Gait & Station: wheelchair dependent due to spina bifida Patient leans: N/A  Psychiatric Specialty Exam: Review of Systems  Constitutional: Negative.   HENT: Negative.   Eyes: Negative.   Respiratory: Negative.   Cardiovascular: Negative.   Gastrointestinal: Negative.   Genitourinary: Negative.   Musculoskeletal: Negative.   Skin: Negative.   Neurological: Negative.   Endo/Heme/Allergies: Negative.   Psychiatric/Behavioral: Positive for depression. Negative for suicidal ideas, hallucinations, memory loss and substance abuse. The patient is not nervous/anxious and does not have insomnia.     Blood pressure 107/55, pulse 106, temperature 98.4 F (36.9 C), temperature source Oral, resp. rate 18, height 3' (0.914 m), weight 28.8 kg (63 lb 7.9 oz), SpO2 100 %.Body mass index is 34.47 kg/(m^2).  General Appearance:  Fairly Groomed  Engineer, water::  Fair  Speech:  Slow  Volume:  Decreased  Mood:  Dysphoric  Affect:  Blunt  Thought Process:  Linear  Orientation:  Full (Time, Place, and Person)  Thought Content:  Hallucinations: None  Suicidal Thoughts:  No  Homicidal Thoughts:  No  Memory:  Immediate;   Good Recent;   Good Remote;   Good  Judgement:  Fair  Insight:  Fair  Psychomotor Activity:  Decreased  Concentration:  Good  Recall:  NA  Fund of Knowledge:Good  Language: Good  Akathisia:  No  Handed:    AIMS (if indicated):     Assets:  Communication Skills Desire for Improvement Financial Resources/Insurance Housing Social Support  ADL's:  Intact  Cognition: WNL  Sleep:  Number of Hours: 7   Treatment Plan Summary: Daily contact with patient to assess and evaluate symptoms and progress in treatment and Medication management  24 year old single African-American female with spina bifida and end-stage renal failure who was admitted to our facility after overdosing on amitriptyline. As the stressors patient reports on a stable living situation, conflict with her grandmother, and inability to find a job due to her physical disabilities. Patient voices symptoms consistent with a major depressive disorder.  Major depressive disorder: Patient has been  started on sertraline 12.5 mg by mouth daily and mirtazapine 15 mg by mouth daily at bedtime.  Insomnia: continue mirtazapine 15 mg by mouth daily at bedtime.  End-stage renal failure: Nephrology consulted as patient is in need of dialysis 3 times a week on Tuesdays, Thursdays and Saturdays. Had dialysis yesterday with no complications.  Hypertension: Reported history of hypertension however currently vital signs are within the normal limits. Patient was not on any antihypertensives prior to admission. Blood pressure stable today  Labs: TSH is within the normal limits. Vitamin B12 is pending.  Diet: Low sodium  Precautions: Every 15  minute checks  Hospitalization and status: Continue involuntary commitment  Discharge disposition: Once stable the patient will be discharged back to her family. She will be scheduled to follow-up with outpatient psychiatric services. We will also make a referral for vocational rehabilitation.   Hildred Priest 05/16/2015, 11:58 AM

## 2015-05-16 NOTE — Plan of Care (Signed)
Problem: [facility] Participation in Recreation Therapeutic Interventions Goal: STG-Patient will demonstrate improved self esteem by identif STG: Self-Esteem - Within 4 treatment sessions, patient will verablize at least 5 positive affirmation statements in each of 2 treatment sessions to increase self-esteem post d/c.  Outcome: Progressing Treatment Session 1; Completed 1 out of 2: At approximately 1:25 pm, LRT met with patient in patient room. Patient verbalized 5 positive affirmation statements. Patient reported it felt "good". LRT encouraged patient to continue saying positive affirmation statements.  Leonette Monarch, LRT/CTRS 10.19.16 2:36 pm Goal: STG-Other Recreation Therapy Goal (Specify) STG: Stress Management - Within 5 treatment sessions, patient will verbalize understanding of the stress management techniques in each of 2 treatment sessions to increase stress management skills post d/c.  Outcome: Progressing Treatment Session 1; Completed 1 out of 2: At approximately 1:25 pm, LRT met with patient in patient room. LRT educated and provided patient with handouts on the stress management techniques. Patient verbalized understanding. LRT encouraged patient to read over and practice the stress management techniques.  Leonette Monarch, LRT/CTRS 10.19.16 2:37 pm

## 2015-05-17 MED ORDER — SERTRALINE HCL 25 MG PO TABS
25.0000 mg | ORAL_TABLET | Freq: Every day | ORAL | Status: DC
  Administered 2015-05-17 – 2015-05-18 (×2): 25 mg via ORAL
  Filled 2015-05-17 (×2): qty 1

## 2015-05-17 MED ORDER — TRAZODONE HCL 50 MG PO TABS
75.0000 mg | ORAL_TABLET | Freq: Every day | ORAL | Status: DC
  Administered 2015-05-17: 75 mg via ORAL
  Filled 2015-05-17 (×2): qty 1

## 2015-05-17 MED ORDER — PROMETHAZINE HCL 25 MG/ML IJ SOLN
12.5000 mg | Freq: Once | INTRAMUSCULAR | Status: AC
  Administered 2015-05-17: 12.5 mg via INTRAVENOUS
  Filled 2015-05-17: qty 1

## 2015-05-17 NOTE — Plan of Care (Signed)
Problem: Ineffective individual coping Goal: STG: Patient will remain free from self harm Outcome: Progressing New medications (Trazodone 75 mg) administered as ordered by the physician, medication Therapeutic & Expected Effects, SEs and Adverse effects discussed, questions encouraged; no PRN given, 15 minute checks maintained for safety, room closer to the nurses' stationclinical and moral support provided, patient encouraged to continue to express feelings and demonstrate safe care. Patient remains free from harm, will continue to monitor.

## 2015-05-17 NOTE — Progress Notes (Signed)
D: Patient noted in better spirts this shift. Noted to smile during conversation .Patient attending unit programing .  Able to attend daylsis and return.  Without incidence. Patient  Noted in conversation more with her peers. Appropriate ADL'S  A: Instruction given on medication . Verbalize understanding,  bruit heard thrill felt. R:Voice no other concerns.

## 2015-05-17 NOTE — [facility] Group Notes (Signed)
[facility] Group Notes:  (Nursing/MHT/Case Management/Adjunct)  Date:  05/17/2015  Time:  3:38 PM  Type of Therapy:  Psychoeducational Skills  Participation Level:  Did Not Attend   [name] [facility] 05/17/2015, 3:38 PM

## 2015-05-17 NOTE — Plan of Care (Signed)
Problem: Alteration in mood Goal: LTG-Patient reports reduction in suicidal thoughts (Patient reports reduction in suicidal thoughts and is able to verbalize a safety plan for whenever patient is feeling suicidal)  Outcome: Progressing Patient denied suicidal thoughts or thoughts of self harm, she is able to express feelings and concerns.  Problem: Ineffective individual coping Goal: STG: Patient will remain free from self harm Outcome: Progressing Medications (Remeron 15 mg) administered as ordered by the physician, medications Therapeutic Effects, SEs and Adverse effects discussed, questions encouraged; no PRN given, 15 minute checks maintained for safety, clinical and moral support provided, patient encouraged to continue to express feelings and demonstrate safe care. Patient remain free from harm, will continue to monitor.

## 2015-05-17 NOTE — Progress Notes (Signed)
Patient continues to improve in mood and affect, was allowed per her request to use portable phone from the unit to contact family and her loved ones; denied pain, denied SI/HI, denied AV/H, well groomed appearance, thoughts organized, coherent, voice volume low and appropriate. Will continue to monitor.

## 2015-05-17 NOTE — [facility] Group Notes (Signed)
[facility] LCSW Group Therapy  05/17/2015 1:13 PM  Type of Therapy:  Group Therapy  Participation Level:  Did Not Attend  Modes of Intervention:  Discussion, Education, Socialization and Support  Summary of Progress/Problems: Balance in life: Patients will discuss the concept of balance and how it looks and feels to be unbalanced. Pt will identify areas in their life that is unbalanced and ways to become more balanced.   Marillyn Goren L Durk Carmen MSW, LCSWA  05/17/2015, 1:13 PM 

## 2015-05-17 NOTE — Progress Notes (Signed)
PT Cancellation Note  Patient Details Name: SUSHMA SAHADEO MRN: [id number] DOB: [birth year]   Cancelled Treatment:    Reason Eval/Treat Not Completed: Other (comment) (See PT note for further details) PT not available this afternoon for PT consultation secondary to being in blocked group time. PT will attempt at later time/date when patient is available    Janyth Contes 05/17/2015, 2:03 PM  Janyth Contes, SPT. [phone]

## 2015-05-17 NOTE — Progress Notes (Signed)
[facility] MD Progress Note  05/17/2015 11:31 AM [name]  MRN:  [id number] Subjective:  Patient feels better. Denies depressed mood, suicidality, homicidality or having auditory or visual hallucinations. Patient denies side effects from medications. Denies issues with sleep, appetite energy or concentration. She does complain of insomnia and feels that the medications given for sleep was not helpful.  Patient denies having any physical complaints.  Had dialysis today with no complication   Per nursing: A&Ox3, self ambulatory in Wheelchair about the unit, spirit is good and upbeat, smiling, bright affect, well groomed, neat appearance, patient said that she had a good day, denied pain, denied SI/HI, anticipating discharge and missing home.  Principal Problem: Major depressive disorder, single episode, severe without psychotic features ([place]) Diagnosis:   Patient Active Problem List   Diagnosis Date Noted  . Major depressive disorder, single episode, severe without psychotic features ([place]) [F32.2] 05/15/2015  . Spina bifida ([place]) [Q05.9] 01/18/2012  . ESRD (end stage renal disease) on dialysis ([place]) [N18.6, Z99.2] 05/02/2011  . Anemia [D64.9] 05/02/2011  . HTN (hypertension) [I10] 05/02/2011   Total Time spent with patient: 30 minutes  Past Psychiatric History: Patient denies having any prior psychiatric treatment. Denies prior trials with antidepressants or any other psychotropics. Denies history of self-injurious behaviors. She did have a prior suicidal attempt by overdosing on Benadryl about 2 years ago. Nobody in her family knew as she vomited the pills immediately after and never told anybody.  Risk to Self: Is patient at risk for suicide?: Yes Risk to Others:  no Prior Inpatient Therapy:  no Prior Outpatient Therapy:  no  Alcohol Screening: 1. How often do you have a drink containing alcohol?: Never 9. Have you or someone else been injured as a result of your drinking?: No 10.  Has a relative or friend or a doctor or another health worker been concerned about your drinking or suggested you cut down?: No Alcohol Use Disorder Identification Test Final Score (AUDIT): 0 Brief Intervention: AUDIT score less than 7 or less-screening does not suggest unhealthy drinking-brief intervention not indicated  Past Medical History: Spina bifida-wheelchair dependent. Endstage renal failure with dialysis. Hypertension. No history of surgical procedures, head trauma or seizures. Past Medical History  Diagnosis Date  . Spina bifida   . UTI (lower urinary tract infection)   . HTN (hypertension) 05/02/2011  . Anemia associated with chronic renal failure   . 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  . Blood transfusion   . Dialysis care   . ESRD (end stage renal disease) on dialysis ([place])   . Depression 05/14/2015    Past Surgical History  Procedure Laterality Date  . Av fistula placement      left arm   Family History:  Family History  Problem Relation Age of Onset  . Kidney cancer Other    Family Psychiatric History: Patient reports that her biological father committed suicide when the patient was 24 years old.  Social History: Patient is single, never married. Pt states she has a HS education and gets disability. Currently is staying at her grandmother's house but planning to move out soon to stay with her aunt in a trailer. Denies any legal charges. Patient has never held a job. History  Alcohol Use No    History  Drug Use No    Social History   Social History  . Marital Status: Single    Spouse Name: N/A  . Number of Children: N/A  .  Years of Education: N/A   Social History Main Topics  . Smoking status: Never Smoker   . Smokeless tobacco: None  . Alcohol Use: No  . Drug Use: No  . Sexual Activity: No        Sleep: Good  Appetite:   Good  Current Medications: Current Facility-Administered Medications  Medication Dose Route Frequency Provider Last Rate Last Dose  . acetaminophen (TYLENOL) tablet 650 mg  650 mg Oral Q6H PRN Marjie Skiff, MD      . b complex-vitamin c-folic acid (NEPHRO-VITE) tablet 1 tablet  1 tablet Oral Daily Hildred Priest, MD   1 tablet at 05/16/15 0936  . cinacalcet (SENSIPAR) tablet 30 mg  30 mg Oral Q breakfast Hildred Priest, MD   30 mg at 05/17/15 0846  . hydrOXYzine (ATARAX/VISTARIL) tablet 50 mg  50 mg Oral TID PRN Marjie Skiff, MD      . mirtazapine (REMERON) tablet 15 mg  15 mg Oral QHS Hildred Priest, MD   15 mg at 05/16/15 2122  . ondansetron (ZOFRAN) tablet 4 mg  4 mg Oral Q8H PRN Hildred Priest, MD   4 mg at 05/17/15 0847  . sertraline (ZOLOFT) tablet 12.5 mg  12.5 mg Oral Daily Hildred Priest, MD   12.5 mg at 05/16/15 0937  . sevelamer carbonate (RENVELA) tablet 800 mg  800 mg Oral TID WC Hildred Priest, MD   800 mg at 05/16/15 1744  . white petrolatum (VASELINE) gel   Topical PRN Hildred Priest, MD        Lab Results:  Results for orders placed or performed during the hospital encounter of 05/14/15 (from the past 48 hour(s))  Hepatitis B surface antigen     Status: None   Collection Time: 05/15/15  3:23 PM  Result Value Ref Range   Hepatitis B Surface Ag Negative Negative    Comment: (NOTE) Performed At: [facility] [place], [place] [id number] Lindon Romp MD [id number]   Hepatitis B surface antibody     Status: None   Collection Time: 05/15/15  3:23 PM  Result Value Ref Range   Hepatitis B-Post 41.3 Immunity>9.9 mIU/mL    Comment: (NOTE)  Status of Immunity                     Anti-HBs Level  ------------------                     -------------- Inconsistent with Immunity                   0.0 - 9.9 Consistent with Immunity                           >9.9 Performed At: [facility] [place], [place] [id number] Lindon Romp MD [id number]   TSH     Status: None   Collection Time: 05/15/15  3:23 PM  Result Value Ref Range   TSH 0.867 0.350 - 4.500 uIU/mL  T4, free     Status: None   Collection Time: 05/15/15  3:23 PM  Result Value Ref Range   Free T4 0.85 0.61 - 1.12 ng/dL  Vitamin B12     Status: None   Collection Time: 05/16/15  6:26 AM  Result Value Ref Range   Vitamin B-12 661 180 - 914 pg/mL    Comment: (NOTE) This assay is not validated for  testing neonatal or myeloproliferative syndrome specimens for Vitamin B12 levels. Performed at [facility]     Physical Findings: AIMS: Facial and Oral Movements Muscles of Facial Expression: None, normal Lips and Perioral Area: None, normal Jaw: None, normal Tongue: None, normal,Extremity Movements Upper (arms, wrists, hands, fingers): None, normal Lower (legs, knees, ankles, toes): None, normal, Trunk Movements Neck, shoulders, hips: None, normal, Overall Severity Severity of abnormal movements (highest score from questions above): None, normal Incapacitation due to abnormal movements: None, normal Patient's awareness of abnormal movements (rate only patient's report): No Awareness, Dental Status Current problems with teeth and/or dentures?: No Does patient usually wear dentures?: No  CIWA:    COWS:     Musculoskeletal: Strength & Muscle Tone: Within the normal limits on her upper extremities and atrophic on her lower extremities Gait & Station: wheelchair dependent due to spina bifida Patient leans: N/A  Psychiatric Specialty Exam: Review of Systems  Constitutional: Negative.   HENT: Negative.   Eyes: Negative.   Respiratory: Negative.   Cardiovascular: Negative.   Gastrointestinal: Negative.   Genitourinary: Negative.   Musculoskeletal: Negative.   Skin: Negative.   Neurological: Negative.   Endo/Heme/Allergies: Negative.    Psychiatric/Behavioral: Positive for depression. Negative for suicidal ideas, hallucinations, memory loss and substance abuse. The patient is not nervous/anxious and does not have insomnia.     Blood pressure 120/67, pulse 93, temperature 97.9 F (36.6 C), temperature source Oral, resp. rate 18, height 3' (0.914 m), weight 28.8 kg (63 lb 7.9 oz), SpO2 100 %.Body mass index is 34.47 kg/(m^2).  General Appearance: Fairly Groomed  Engineer, water::  Fair  Speech:  Clear and Coherent  Volume:  Normal  Mood:  Euthymic  Affect:  Appropriate and Congruent  Thought Process:  Linear  Orientation:  Full (Time, Place, and Person)  Thought Content:  Hallucinations: None  Suicidal Thoughts:  No  Homicidal Thoughts:  No  Memory:  Immediate;   Good Recent;   Good Remote;   Good  Judgement:  Fair  Insight:  Fair  Psychomotor Activity:  Normal  Concentration:  Good  Recall:  NA  Fund of Knowledge:Good  Language: Good  Akathisia:  No  Handed:    AIMS (if indicated):     Assets:  Communication Skills Desire for Improvement Financial Resources/Insurance Housing Social Support  ADL's:  Intact  Cognition: WNL  Sleep:  Number of Hours: 7.15   Treatment Plan Summary: Daily contact with patient to assess and evaluate symptoms and progress in treatment and Medication management  24 year old single African-American female with spina bifida and end-stage renal failure who was admitted to our facility after overdosing on amitriptyline. As the stressors patient reports on a stable living situation, conflict with her grandmother, and inability to find a job due to her physical disabilities. Patient voices symptoms consistent with a major depressive disorder.  Major depressive disorder: Patient has been  started on sertraline 12.5 mg by mouth daily.  Today I will increase it to 25. I will d/c mirtazapine as is not helpful with sleep  Insomnia: Will discontinue mirtazapine and instead try trazodone 75 mg  daily at bedtime.  End-stage renal failure: Nephrology consulted as patient is in need of dialysis 3 times a week on Tuesdays, Thursdays and Saturdays. Patient had dialysis today with a problem.  Hypertension: Reported history of hypertension however currently vital signs are within the normal limits. Patient was not on any antihypertensives prior to admission.  Vital signs within the normal limits  Labs: TSH is within the normal limits. Vitamin B12 is within normal limits  Diet: Low sodium  Precautions: Every 15 minute checks  Hospitalization and status: Continue involuntary commitment  Physical therapy and a patient of therapy will be consulted today in order to determine whether the patient has home health needs.  Discharge disposition: Once stable the patient will be discharged back to her family. She will be scheduled to follow-up with outpatient psychiatric services. We will also make a referral for vocational rehabilitation.  Plan to discharge tomorrow   Hildred Priest 05/17/2015, 11:31 AM

## 2015-05-17 NOTE — Progress Notes (Signed)
A&Ox3, self ambulatory in Wheelchair about the unit, spirit is good and upbeat, smiling, bright affect, well groomed, neat appearance, patient said that she had a good day, denied pain, denied SI/HI, anticipating discharge and missing home.

## 2015-05-17 NOTE — Progress Notes (Signed)
Recreation Therapy Notes  Date: 10.20.16 Time: 3:00 pm Location: Craft Room  Group Topic: Leisure Education  Goal Area(s) Addresses:  Patient will write at least one leisure activity. Patient will write a leisure goal.  Behavioral Response: Arrived late, Attentive   Intervention: Leisure Bucket List  Activity: Patients were given a leisure Insurance account manager and instructed to list different leisure activity they had not tried, but wanted to.  Education:LRT educated patients on what is needed to participate in leisure.  Education Outcome: In group clarification offered  Clinical Observations/Feedback: Patient arrived to group at approximately 3:35 pm. LRT explained activity. Patient worked on the activity. Patient did not contribute to group discussion.  Leonette Monarch, LRT/CTRS 05/17/2015 4:23 PM

## 2015-05-17 NOTE — Plan of Care (Signed)
Problem: Diagnosis: Increased Risk For Suicide Attempt Goal: LTG-Patient Will Show Positive Response to Medication LTG (by discharge) : Patient will show positive response to medication and will participate in the development of the discharge plan.  Outcome: Progressing Affect cheerful on approach engaging in activity

## 2015-05-17 NOTE — Progress Notes (Signed)
Ms [name] had a good and restful night, slept for 7.15 hours, overall, the milieu was therapeutic, quiet and calm.

## 2015-05-17 NOTE — Progress Notes (Signed)
HD tx completed.

## 2015-05-17 NOTE — Progress Notes (Signed)
  Subjective:   Patient seen during dialysis. Tolerating well. No acute complaints. Blood flow rate 300 cc/m per patient request  Objective:  Vital signs in last 24 hours:  Temp:  [97.9 F (36.6 C)-98.7 F (37.1 C)] 98.7 F (37.1 C) (10/20 1055) Pulse Rate:  [93-118] 106 (10/20 1144) Resp:  [16-20] 19 (10/20 1245) BP: (116-121)/(61-81) 116/70 mmHg (10/20 1245) Weight:  [28.8 kg (63 lb 7.9 oz)] 28.8 kg (63 lb 7.9 oz) (10/20 1055)  Weight change:  Filed Weights   05/15/15 0559 05/17/15 1055  Weight: 28.8 kg (63 lb 7.9 oz) 28.8 kg (63 lb 7.9 oz)    Intake/Output:    Intake/Output Summary (Last 24 hours) at 05/17/15 1312 Last data filed at 05/17/15 0919  Gross per 24 hour  Intake    600 ml  Output      0 ml  Net    600 ml     Physical Exam: General:  sitting up in the chair, no distress   HEENT  anicteric, moist mucous membranes   Neck  supple, no masses   Pulm/lungs  normal respiratory effort, clear to auscultation bilaterally   CVS/Heart  no rub   Abdomen:   soft, nontender   Extremities: congenital deformities , rt leg femoral area only, deformed, 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  Neurologic:  alert, oriented, able to communicate normally   Skin:  no acute rashes   Access:  left arm AV fistula        Basic Metabolic Panel:   Recent Labs Lab 05/10/15 2319 05/12/15 1600 05/14/15 0612  NA 131* 135 136  K 3.1* 3.8 4.1  CL 95* 103 95*  CO2 28 22 28   GLUCOSE 108* 78 81  BUN 17 31* 42*  CREATININE 3.46* 5.60* 6.10*  CALCIUM 8.5* 8.6* 9.9  PHOS  --  4.2 6.5*     CBC:  Recent Labs Lab 05/10/15 2319 05/12/15 1600  WBC 6.3 4.7  NEUTROABS 3.6  --   HGB 10.5* 9.9*  HCT 33.2* 31.1*  MCV 96.5 96.0  PLT 163 155      Microbiology:  No results found for this or any previous visit (from the past 720 hour(s)).  Coagulation Studies: No results for input(s): LABPROT, INR in the last 72 hours.  Urinalysis: No results for input(s): COLORURINE, LABSPEC, PHURINE,  GLUCOSEU, HGBUR, BILIRUBINUR, KETONESUR, PROTEINUR, UROBILINOGEN, NITRITE, LEUKOCYTESUR in the last 72 hours.  Invalid input(s): APPERANCEUR    Imaging: No results found.   Medications:     . b complex-vitamin c-folic acid  1 tablet Oral Daily  . cinacalcet  30 mg Oral Q breakfast  . mirtazapine  15 mg Oral QHS  . sertraline  12.5 mg Oral Daily  . sevelamer carbonate  800 mg Oral TID WC   acetaminophen, hydrOXYzine, ondansetron, white petrolatum  Assessment/ Plan:   Pt is a 24 y.o. yo female with a PMHX of spina bifida, ESRD, anemia, hypertension, major depressive disorder, was admitted on 05/14/2015 with acute depression.   1. End-stage renal disease. [facility] dialysis. Tuesday/Thursday/Saturday. Dr Hinda Lenis 2. Anemia of chronic kidney disease 3. Secondary hyperparathyroidism. cinacalcet Plan: Dialysis on regular days T/T/S 3 hour treatment, minimal UF, BFR 300 Continue Renvela, Sensipar TSH and free T4 are normal HR today 107    LOS: 3 Mitali Shenefield 10/20/20161:12 PM

## 2015-05-18 MED ORDER — TRAZODONE HCL 150 MG PO TABS: 75.0000 mg | ORAL_TABLET | Freq: Every day | ORAL | Status: DC

## 2015-05-18 MED ORDER — TRAZODONE HCL 150 MG PO TABS: 150.0000 mg | ORAL_TABLET | Freq: Every evening | ORAL | Status: DC | PRN

## 2015-05-18 MED ORDER — SERTRALINE HCL 25 MG PO TABS: 25.0000 mg | ORAL_TABLET | Freq: Every day | ORAL | Status: DC

## 2015-05-18 NOTE — BHH Suicide Risk Assessment (Signed)
[place] INPATIENT:  Family/Significant Other Suicide Prevention Education  Suicide Prevention Education:  Education Completed; Leanne Chang (aunt) [phone] has been identified by the patient as the family member/significant other with whom the patient will be residing, and identified as the person(s) who will aid the patient in the event of a mental health crisis (suicidal ideations/suicide attempt).  With written consent from the patient, the family member/significant other has been provided the following suicide prevention education, prior to the and/or following the discharge of the patient.  The suicide prevention education provided includes the following:  Suicide risk factors  Suicide prevention and interventions  National Suicide Hotline telephone number  [facility] assessment telephone number  [facility] Emergency Assistance [place] and/or Residential Mobile Crisis Unit telephone number  Request made of family/significant other to:  Remove weapons (e.g., guns, rifles, knives), all items previously/currently identified as safety concern.    Remove drugs/medications (over-the-counter, prescriptions, illicit drugs), all items previously/currently identified as a safety concern.  The family member/significant other verbalizes understanding of the suicide prevention education information provided.  The family member/significant other agrees to remove the items of safety concern listed above.  Keene Breath, MSW, LCSWA 05/18/2015, 11:59 AM

## 2015-05-18 NOTE — Progress Notes (Signed)
OT Cancellation Note  Patient Details Name: [name] MRN: [id number] DOB: 15-Jun-1991   Cancelled Treatment:     Spoke to Candace about needs for OT evaluation and she was going to check with NSG and call me back.  No return phone call and went to unit and was informed that patient had just left to go home.  OT evaluation attempted but not completed after chart reviewed. Please consult [place] if any OT needs were not addressed.  [place], OTR/L ascom [phone] 05/18/2015, 12:15 PM

## 2015-05-18 NOTE — Progress Notes (Signed)
  [facility] Adult Case Management Discharge Plan :  Will you be returning to the same living situation after discharge:  Yes,  home with grandma but will be moving out with aunt in the near future At discharge, do you have transportation home?: Yes,  grandmother will pick up Do you have the ability to pay for your medications: Yes,  paitent has Medicaid and insurance  Release of information consent forms completed and in the chart;  Patient's signature needed at discharge.  Patient to Follow up at: Follow-up Information    Follow up with [facility]. Go on 05/21/2015.   Why:  For follow-up care appt Monday 05/21/15 at 11:00am   Contact information:   [place]  [place], [place] Ph [phone] Ph [phone] (outpatient only) Fax [phone]       Patient denies SI/HI: Yes,  patient denies SI/HI    Safety Planning and Suicide Prevention discussed: Yes,  SPE discussed with patient and her aunt Leanne Chang [phone]  Have you used any form of tobacco in the last 30 days? (Cigarettes, Smokeless Tobacco, Cigars, and/or Pipes): No  Has patient been referred to the Quitline?: N/A patient is not a smoker  Carmell Austria T, MSW, LCSWA 05/18/2015, 12:01 PM

## 2015-05-18 NOTE — BHH Group Notes (Signed)
[place] Group Notes:  (Nursing/MHT/Case Management/Adjunct)  Date:  05/18/2015  Time:  5:49 AM  Type of Therapy:  Group Therapy  Participation Level:  Active  Participation Quality:  Appropriate  Affect:  Appropriate  Cognitive:  Alert  Insight:  Appropriate  Engagement in Group:  Engaged  Modes of Intervention:  Discussion  Summary of Progress/Problems:  Rasheem Figiel Joy Tineshia Becraft 05/18/2015, 5:49 AM

## 2015-05-18 NOTE — Progress Notes (Signed)
PT Cancellation Note  Patient Details Name: [name] MRN: [id number] DOB: 1991-05-21   Cancelled Treatment:    Reason Eval/Treat Not Completed: Other (comment) (See PT note for further details) PT attempted in PM, however pt not available at that time. In communication with MD, PT order can be discontinued secondary to pt being at baseline level of function. Therefore pt will be d/c'd in house from PT services and order will be discontinued. Should further PT needs arise, please issue new orders.    Janyth Contes 05/18/2015, 8:09 AM  Janyth Contes, SPT. [phone]

## 2015-05-18 NOTE — Progress Notes (Signed)
D: pt aware of discharge this shift, pt denies suicidal ideation or homicidal ideation, pt calm and cooperative, no distress noted  A: all personal items in locker returned to patient, instructions given on discharge information, received prescriptions and follow up appointment  R: patient states she will comply with outpatient services and medications as prescribed.  Patient left with  Grandmother

## 2015-05-18 NOTE — BHH Group Notes (Signed)
[facility] LCSW Aftercare Discharge Planning Group Note   05/18/2015 12:00 PM  Participation Quality:  Patient did not attend group   Keene Breath, MSW, [employer]

## 2015-05-18 NOTE — Progress Notes (Signed)
Recreation Therapy Notes  INPATIENT RECREATION TR PLAN  Patient Details Name: [name] MRN: [id number] DOB: [birth year] Today's Date: 05/18/2015  Rec Therapy Plan Is patient appropriate for Therapeutic Recreation?: Yes Treatment times per week: At least once a week TR Treatment/Interventions: 1:1 session, Group participation (Comment) (Appropriate participation in daily recreation therapy tx)  Discharge Criteria Pt will be discharged from therapy if:: Treatment goals are met, Discharged  Discharge Summary Short term goals set: See Care Plan Short term goals met: Complete Progress toward goals comments: One-to-one attended Which groups?: Leisure education, Self-esteem One-to-one attended: Self-esteem, stress management, communication Reason goals not met: N/A Therapeutic equipment acquired: None Reason patient discharged from therapy: Discharge from hospital Pt/family agrees with progress & goals achieved: Yes Date patient discharged from therapy: 05/18/15   Leonette Monarch, LRT/CTRS 05/18/2015, 4:37 PM

## 2015-05-18 NOTE — BHH Suicide Risk Assessment (Signed)
[facility] Discharge Suicide Risk Assessment   Demographic Factors:  Adolescent or young adult, Low socioeconomic status and Unemployed  Total Time spent with patient: 30 minutes     Psychiatric Specialty Exam: Physical Exam  ROS                                                          Have you used any form of tobacco in the last 30 days? (Cigarettes, Smokeless Tobacco, Cigars, and/or Pipes): No  Has this patient used any form of tobacco in the last 30 days? (Cigarettes, Smokeless Tobacco, Cigars, and/or Pipes) No  Mental Status Per Nursing Assessment::   On Admission:  Suicidal ideation indicated by patient  Current Mental Status by Physician: Mood is much improved. She is hopeful, future oriented. Denies poor ligamentous or helplessness. Denies guilt. No longer having SI. Denies HI or auditory or visual hallucinations..  Loss Factors: Financial problems/change in socioeconomic status  Historical Factors: Prior suicide attempts  Risk Reduction Factors:   Sense of responsibility to family, Living with another person, especially a relative and Positive social support  Continued Clinical Symptoms:  Depression:   Severe Medical Diagnoses and Treatments/Surgeries  Cognitive Features That Contribute To Risk:  None    Suicide Risk:  Minimal: No identifiable suicidal ideation.  Patients presenting with no risk factors but with morbid ruminations; may be classified as minimal risk based on the severity of the depressive symptoms  Principal Problem: Major depressive disorder, single episode, severe without psychotic features [facility]) Discharge Diagnoses:  Patient Active Problem List   Diagnosis Date Noted  . Major depressive disorder, single episode, severe without psychotic features ([place]) [F32.2] 05/15/2015  . Spina bifida ([place]) [Q05.9] 01/18/2012  . ESRD (end stage renal disease) on dialysis ([place]) [N18.6, Z99.2] 05/02/2011  . Anemia [D64.9] 05/02/2011  .  HTN (hypertension) [I10] 05/02/2011      Is patient on multiple antipsychotic therapies at discharge:  No   Has Patient had three or more failed trials of antipsychotic monotherapy by history:  No  Recommended Plan for Multiple Antipsychotic Therapies: NA    Hildred Priest 05/18/2015, 11:17 AM

## 2015-05-18 NOTE — Discharge Summary (Signed)
Physician Discharge Summary Note  Patient:  [name] is an 24 y.o., female MRN:  [id number] DOB:  04/05/1991 Patient phone:  [phone] (home)  Patient address:   [address] [place] [address],  Total Time spent with patient: 30 minutes  Date of Admission:  05/14/2015 Date of Discharge: 05/18/15  Reason for Admission:  S/p OD  Principal Problem: Major depressive disorder, single episode, severe without psychotic features [facility]) Discharge Diagnoses: Patient Active Problem List   Diagnosis Date Noted  . Major depressive disorder, single episode, severe without psychotic features ([place]) [F32.2] 05/15/2015  . Spina bifida ([place]) [Q05.9] 01/18/2012  . ESRD (end stage renal disease) on dialysis ([place]) [N18.6, Z99.2] 05/02/2011  . Anemia [D64.9] 05/02/2011  . HTN (hypertension) [I10] 05/02/2011    Musculoskeletal: Strength & Muscle Tone: within normal limits Gait & Station: wheelchair bound Patient leans: N/A  Psychiatric Specialty Exam: Physical Exam  Constitutional: She is oriented to person, place, and time. She appears well-developed and well-nourished.  HENT:  Head: Normocephalic and atraumatic.  Eyes: Conjunctivae and EOM are normal.  Neck: Normal range of motion.  Respiratory: Effort normal.  Musculoskeletal: Normal range of motion.  Neurological: She is alert and oriented to person, place, and time.  Skin: Skin is warm and dry.    Review of Systems  HENT: Negative.   Eyes: Negative.   Respiratory: Negative.   Cardiovascular: Negative.   Gastrointestinal: Negative.   Genitourinary: Negative.   Musculoskeletal: Negative.   Skin: Negative.   Neurological: Negative.   Endo/Heme/Allergies: Negative.   Psychiatric/Behavioral: Negative.     Blood pressure 117/75, pulse 104, temperature 98.2 F (36.8 C), temperature source Oral, resp. rate 17, height 3' (0.914 m), weight 28.8 kg (63 lb 7.9 oz), SpO2 100 %.Body mass index is 34.47 kg/(m^2).  General  Appearance: Well Groomed  Engineer, water::  Good  Speech:  Clear and Coherent  Volume:  Normal  Mood:  Euthymic  Affect:  Congruent  Thought Process:  Linear  Orientation:  Full (Time, Place, and Person)  Thought Content:  Hallucinations: None  Suicidal Thoughts:  No  Homicidal Thoughts:  No  Memory:  Immediate;   Good Recent;   Good Remote;   Good  Judgement:  Good  Insight:  Good  Psychomotor Activity:  Normal  Concentration:  Good  Recall:  NA  Fund of Knowledge:Good  Language: Good  Akathisia:  No  Handed:    AIMS (if indicated):     Assets:  Agricultural consultant Social Support  ADL's:  Intact  Cognition: WNL  Sleep:  Number of Hours: 5   History of Present Illness: [name] is a 24 y.o. female who presented to the Emergency Department [facility] on 10/13. Per cousin, pt was found unresponsive on the bathroom floor with a pill bottle of 25 mg Amitriptyline on the floor next to her. Cousin notes pt did not tell her how many pills she took, and that the pt wrote an apologetic post on facebook prior to the incident. Per relatives this has not happened before. Patient has a PMHx of spina bifida (wheelchair dependent), HTN, and end-stage renal failure on dialysis.   While in the ED, patient was tachycardic on arrival which resolved on its own. She had no evidence of wide QRS complexes. Labs were unremarkable.   In the emergency room patient stated "I am just tired of fighting--I didn't ask to be in a wheelchair, and it's not fair. All anyone sees is  my wheelchair, and not me. I am trying to get a job, and I can't because people don't think I can do things".   Pt states that her job/financial situation is stressful,in addition to her living situation. She currently lives with her aunt, but they are looking for a place and in the mean time have to stay with her GM. "There are a lot of people who live there, and I am the only one she asks to help  pay, and I don't have a job". Pt states she had another OD attempt 2 years ago, but no one knew because she vomited the pills right after.   Patient reports depressive symptoms for several years. She believes the symptoms started after she was removed from the custody of her mother due to concerns of neglect. Around the age of 29 she was placed in foster care for about a year along with her sisters. Her living situation has been on a stable ever since. 2 months before turning 20 the patient returned to live with her mother but is states they just didn't get along. During the time that the patient was staying with her mother her mother divorced and became homeless. The patient and her sisters were staying in hotels. She left and moved in with her aunt. Soon after that her aunt went through a divorce and he became homeless. The patient and her aunt moved into the patient's grandmother's house. The patient is states there are multiple people living in the house; there are 3 adult cousins who live there with their boyfriends and girlfriends, 3 cousins who are minors in addition to the patient her aunt and grandmother. Patient explains that her grandmother is only asking the patient and the aunt to pay bills but not be others living in the house despite them having a job. Therefore the patient's aunt was trying to get a trailer ready and was going to take the patient to live with her. Right before the overdose the patient's aunt and a grandmother got into an argument about the bills. That evening the patient took 8 tablets of Elavil in an attempt to stop her thoughts and go to sleep. Patient thinks and feels that all of these bad things that had happened to her mother and aunt are because of her. The patient was found unconscious by a cousin that had to breaking into the bathroom of the house.   Patient also states that she has been trying to get a part-time job. She has applied to a multitude of  locations such as restaurants and [employer]. Patient states she recently interviewed at a new restaurant along with her cousins. All of the cousins were hired but not her.  As far as symptoms of depression she describes having depressed mood, decreased appetite, anhedonia, insomnia and thoughts of hopelessness and worthlessness. States that prior to the overdose she did have suicidal thoughts but denies having suicidal thoughts today. Denies HI or auditory or visual hallucinations.   Substance abuse history: Denies use of tobacco alcohol or illicit substances.    Associated Signs/Symptoms: Depression Symptoms: depressed mood, anhedonia, insomnia, fatigue, hopelessness, suicidal attempt, increased appetite, (Hypo) Manic Symptoms: none Anxiety Symptoms: none Psychotic Symptoms: none PTSD Symptoms: Negative Total Time spent with patient: 1 hour  Past Psychiatric History: Patient denies having any prior psychiatric treatment. Denies prior trials with antidepressants or any other psychotropics. Denies history of self-injurious behaviors. She did have a prior suicidal attempt by overdosing on Benadryl about  2 years ago. Nobody in her family knew as she vomited the pills immediately after and never told anybody.  Risk to Self: Is patient at risk for suicide?: Yes Risk to Others:  no Prior Inpatient Therapy:  no Prior Outpatient Therapy:  no  Alcohol Screening: 1. How often do you have a drink containing alcohol?: Never 9. Have you or someone else been injured as a result of your drinking?: No 10. Has a relative or friend or a doctor or another health worker been concerned about your drinking or suggested you cut down?: No Alcohol Use Disorder Identification Test Final Score (AUDIT): 0 Brief Intervention: AUDIT score less than 7 or less-screening does not suggest unhealthy drinking-brief intervention not indicated  Past Medical History: Spina bifida-wheelchair  dependent. Endstage renal failure with dialysis. Hypertension. No history of surgical procedures, head trauma or seizures. Past Medical History  Diagnosis Date  . Spina bifida   . UTI (lower urinary tract infection)   . HTN (hypertension) 05/02/2011  . Anemia associated with chronic renal failure   . 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  . Blood transfusion   . Dialysis care   . ESRD (end stage renal disease) on dialysis ([place])   . Depression 05/14/2015    Past Surgical History  Procedure Laterality Date  . Av fistula placement      left arm   Family History:  Family History  Problem Relation Age of Onset  . Kidney cancer Other    Family Psychiatric History: Patient reports that her biological father committed suicide when the patient was 24 years old.  Social History: Patient is single, never married. Pt states she has a HS education and gets disability. Currently is staying at her grandmother's house but planning to move out soon to stay with her aunt in a trailer. Denies any legal charges. Patient has never held a job. History  Alcohol Use No    History  Drug Use No    Social History   Social History  . Marital Status: Single    Spouse Name: N/A  . Number of Children: N/A  . Years of Education: N/A   Social History Main Topics  . Smoking status: Never Smoker   . Smokeless tobacco: None  . Alcohol Use: No  . Drug Use: No  . Sexual Activity: No          Hospital Course:   24 year old single African-American female with spina bifida and end-stage renal failure who was admitted to our facility after overdosing on amitriptyline. As the stressors patient reports on a stable living situation, conflict with her grandmother, and inability to find a job due to her physical disabilities. Patient voices symptoms consistent with a major depressive  disorder.  Major depressive disorder: Patient will be discharged on sertraline 25 mg a day.  Insomnia: Trazodone will be increased at discharge to 150 mg qhs  End-stage renal failure: Nephrology consulted as patient is in need of dialysis 3 times a week on Tuesdays, Thursdays and Saturdays. Patient had dialysis last on 10/20  Hypertension: Reported history of hypertension however currently vital signs are within the normal limits. Patient was not on any antihypertensives prior to admission. Vital signs within the normal limits  Labs: TSH is within the normal limits. Vitamin B12 is within normal limits  Diet: Low sodium  Discharge disposition: Patient will be discharged today back to her family. She will be scheduled to follow-up with  During her stay  in the unit the patient was calm, pleasant, and cooperative. She attended programming. She was compliant with treatment. The patient was follow-up by neurology. Patient had dialysis twice during her stay in the hospital without complications.  There was no need for seclusion, restraints or forced medications. The patient tolerated medications well and did not report any side effects.  On discharge he denied having any physical complaints.    Patient denies SI, HI, auditory or visual hallucinations. She denies hopelessness, helplessness or worthlessness. Suicide risk at discharge is low.   Discharge Vitals:   Blood pressure 117/75, pulse 104, temperature 98.2 F (36.8 C), temperature source Oral, resp. rate 17, height 3' (0.914 m), weight 28.8 kg (63 lb 7.9 oz), SpO2 100 %. Body mass index is 34.47 kg/(m^2).  Lab Results:   Results for orders placed or performed during the hospital encounter of 05/14/15 (from the past 72 hour(s))  Hepatitis B surface antigen     Status: None   Collection Time: 05/15/15  3:23 PM  Result Value Ref Range   Hepatitis B Surface Ag Negative Negative    Comment: (NOTE) Performed At: [facility] [place], [place] [id number] Lindon Romp MD [id number]   Hepatitis B surface antibody     Status: None   Collection Time: 05/15/15  3:23 PM  Result Value Ref Range   Hepatitis B-Post 41.3 Immunity>9.9 mIU/mL    Comment: (NOTE)  Status of Immunity                     Anti-HBs Level  ------------------                     -------------- Inconsistent with Immunity                   0.0 - 9.9 Consistent with Immunity                          >9.9 Performed At: [facility] [place], [place] [id number] Lindon Romp MD [id number]   TSH     Status: None   Collection Time: 05/15/15  3:23 PM  Result Value Ref Range   TSH 0.867 0.350 - 4.500 uIU/mL  T4, free     Status: None   Collection Time: 05/15/15  3:23 PM  Result Value Ref Range   Free T4 0.85 0.61 - 1.12 ng/dL  Vitamin B12     Status: None   Collection Time: 05/16/15  6:26 AM  Result Value Ref Range   Vitamin B-12 661 180 - 914 pg/mL    Comment: (NOTE) This assay is not validated for testing neonatal or myeloproliferative syndrome specimens for Vitamin B12 levels. Performed at [facility]     Physical Findings: AIMS: Facial and Oral Movements Muscles of Facial Expression: None, normal Lips and Perioral Area: None, normal Jaw: None, normal Tongue: None, normal,Extremity Movements Upper (arms, wrists, hands, fingers): None, normal Lower (legs, knees, ankles, toes): None, normal, Trunk Movements Neck, shoulders, hips: None, normal, Overall Severity Severity of abnormal movements (highest score from questions above): None, normal Incapacitation due to abnormal movements: None, normal Patient's awareness of abnormal movements (rate only patient's report): No Awareness, Dental Status Current problems with teeth and/or dentures?: No Does patient usually wear dentures?: No  CIWA:    COWS:       Discharge Instructions    Diet - low sodium  heart healthy    Complete by:  As  directed             Medication List    STOP taking these medications        acetaminophen 500 MG tablet  Commonly known as:  TYLENOL     ALPRAZolam 0.25 MG tablet  Commonly known as:  XANAX      TAKE these medications      Indication   cinacalcet 30 MG tablet  Commonly known as:  SENSIPAR  Take 30 mg by mouth daily.  Notes to Patient:  Renal failure      EPOGEN IJ  Inject as directed. Pt gets on dialysis days which are Tuesday, Thursday, and Saturday.  Notes to Patient:  anemia      multivitamin Tabs tablet  Take 1 tablet by mouth daily.  Notes to Patient:  Nutritional supplement      sertraline 25 MG tablet  Commonly known as:  ZOLOFT  Take 1 tablet (25 mg total) by mouth daily.  Notes to Patient:  depression      sevelamer carbonate 800 MG tablet  Commonly known as:  RENVELA  Take 800 mg by mouth 3 (three) times daily with meals.  Notes to Patient:  Renal failure      traZODone 150 MG tablet  Commonly known as:  DESYREL  Take 1 tablet (150 mg total) by mouth at bedtime as needed for sleep.  Notes to Patient:  insomnia      Vitamin D (Ergocalciferol) 50000 UNITS Caps capsule  Commonly known as:  DRISDOL  Take 50,000 Units by mouth every 7 (seven) days. Takes on Monday.  Notes to Patient:  Renal failure          Total Discharge Time: 30 minutes  Signed: Hildred Priest 05/18/2015, 11:19 AM

## 2015-05-18 NOTE — BHH Counselor (Signed)
Adult Comprehensive Assessment  Patient ID: KAIANA DIGIANDOMENICO, female   DOB: 22-Jul-1991, 22 y.o.   MRN: [id number]  Information Source: Information source: Patient  Current Stressors:  Employment / Job issues: disabled but would like to work Family Relationships: stressed for now with mom Housing / Lack of housing: chaning living arrangement Physical health (include injuries & life threatening diseases): spnal bifida  Living/Environment/Situation:  Living Arrangements: Other relatives What is atmosphere in current home: Chaotic  Family History:  Marital status: Single Does patient have children?: No  Childhood History:  By whom was/is the patient raised?: Mother, Royce Macadamia parents Additional childhood history information: patient shared taht she went to foster care for a short period but then was in rehab for her disability as a child but her siblings were in foster care for a logner period of time Does patient have siblings?: Yes Number of Siblings: 3 Description of patient's current relationship with siblings: 1 older and 2 younger that are teens with good relationship  Education:  Highest grade of school patient has completed: HS Currently a student?: No Learning disability?: No  Employment/Work Situation:   Employment situation: On disability Has patient ever been in the [employer]?: No Has patient ever served in Recruitment consultant?: No  Financial Resources:   Museum/gallery curator resources: Teacher, early years/pre Does patient have a Programmer, applications or guardian?: No  Alcohol/Substance Abuse:      Social Support System:   Heritage manager System: Good Type of faith/religion: none  Leisure/Recreation:      Strengths/Needs:      Discharge Plan:   Does patient have access to transportation?: Yes Will patient be returning to same living situation after discharge?: Yes (temporailty but will move out with her aunt eventually) Currently receiving community mental health services: Yes  (From Whom) ([facility]) Does patient have financial barriers related to discharge medications?: No  Summary/Recommendations:  Patient is a single 24 yo AA female admitted for depression and suicide attempt. Patient is diagnosed spinal bifida, end stage renal failure and the home has brought about stress. Patient plans to return home where her grandmother, mom, and aunt lives with her children but will move out soon with her aunt and her children. Patient was offered Voc Rehab in [facility] and Section 8 housing as well for independent living if she so chooses. Patient will follow up at [facility] in [place]. Patient is encouraged to participate in group therapy, medication management, and therapeutic milieu.    Keene Breath., MSW, Latanya Presser 05/18/2015

## 2015-05-18 NOTE — Plan of Care (Signed)
Problem: [facility] Participation in Recreation Therapeutic Interventions Goal: STG-Patient will demonstrate improved self esteem by identif STG: Self-Esteem - Within 4 treatment sessions, patient will verablize at least 5 positive affirmation statements in each of 2 treatment sessions to increase self-esteem post d/c.  Outcome: Completed/Met Date Met:  05/18/15 Treatment Session 2; Completed 2 out of 2: At approximately 3:45 on 10.20.16. LRT met with patient in craft room. Patient verbalized 5 positive affirmation statements. Patient reported it felt "good". LRT encouraged patient to continue saying positive affirmation statements.  Leonette Monarch, LRT/CTRS 10.21.16 4:34 pm Goal: STG-Patient will demonstrate improved communication skills b STG: Communication - Within 3 treatment sessions, patient will participate in communication activity in one treatment session to increase healthy communication skills post d/c.  Outcome: Completed/Met Date Met:  05/18/15 Treatment Session 1; Completed 1 out of 1: At approximately 3:45 pm on 10.20.16, LRT met with patient in craft room. Patient was instructed to take as much toilet paper as she would use in 3 hours. For each square, patient was instructed to give a fun fact about herself. Patient able to give a fun fact for each square. LRT educated patient on communication and trust.  Leonette Monarch, LRT/CTRS 10.21.16 4:36 pm Goal: STG-Other Recreation Therapy Goal (Specify) STG: Stress Management - Within 5 treatment sessions, patient will verbalize understanding of the stress management techniques in each of 2 treatment sessions to increase stress management skills post d/c.  Outcome: Completed/Met Date Met:  05/18/15 Treatment Session 2; Completed 2 out of 2: At approximately 3:45 pm on 10.20.16. LRT met with patient in craft room. Patient verbalized understanding of the stress management techniques.  Leonette Monarch, LRT/CTRS 10.21.16 4:36 pm

## 2015-05-19 DIAGNOSIS — N186 End stage renal disease: Secondary | ICD-10-CM | POA: Diagnosis not present

## 2015-05-19 DIAGNOSIS — D631 Anemia in chronic kidney disease: Secondary | ICD-10-CM | POA: Diagnosis not present

## 2015-05-19 DIAGNOSIS — N2581 Secondary hyperparathyroidism of renal origin: Secondary | ICD-10-CM | POA: Diagnosis not present

## 2015-05-22 DIAGNOSIS — N2581 Secondary hyperparathyroidism of renal origin: Secondary | ICD-10-CM | POA: Diagnosis not present

## 2015-05-22 DIAGNOSIS — N186 End stage renal disease: Secondary | ICD-10-CM | POA: Diagnosis not present

## 2015-05-22 DIAGNOSIS — D631 Anemia in chronic kidney disease: Secondary | ICD-10-CM | POA: Diagnosis not present

## 2015-05-24 DIAGNOSIS — N2581 Secondary hyperparathyroidism of renal origin: Secondary | ICD-10-CM | POA: Diagnosis not present

## 2015-05-24 DIAGNOSIS — N186 End stage renal disease: Secondary | ICD-10-CM | POA: Diagnosis not present

## 2015-05-24 DIAGNOSIS — D631 Anemia in chronic kidney disease: Secondary | ICD-10-CM | POA: Diagnosis not present

## 2015-05-26 DIAGNOSIS — N2581 Secondary hyperparathyroidism of renal origin: Secondary | ICD-10-CM | POA: Diagnosis not present

## 2015-05-26 DIAGNOSIS — D631 Anemia in chronic kidney disease: Secondary | ICD-10-CM | POA: Diagnosis not present

## 2015-05-26 DIAGNOSIS — N186 End stage renal disease: Secondary | ICD-10-CM | POA: Diagnosis not present

## 2015-05-28 DIAGNOSIS — I120 Hypertensive chronic kidney disease with stage 5 chronic kidney disease or end stage renal disease: Secondary | ICD-10-CM | POA: Diagnosis not present

## 2015-05-28 DIAGNOSIS — Z992 Dependence on renal dialysis: Secondary | ICD-10-CM | POA: Diagnosis not present

## 2015-05-28 DIAGNOSIS — N186 End stage renal disease: Secondary | ICD-10-CM | POA: Diagnosis not present

## 2015-05-29 DIAGNOSIS — D631 Anemia in chronic kidney disease: Secondary | ICD-10-CM | POA: Diagnosis not present

## 2015-05-29 DIAGNOSIS — B999 Unspecified infectious disease: Secondary | ICD-10-CM | POA: Diagnosis not present

## 2015-05-29 DIAGNOSIS — N2581 Secondary hyperparathyroidism of renal origin: Secondary | ICD-10-CM | POA: Diagnosis not present

## 2015-05-29 DIAGNOSIS — N186 End stage renal disease: Secondary | ICD-10-CM | POA: Diagnosis not present

## 2015-05-31 DIAGNOSIS — N2581 Secondary hyperparathyroidism of renal origin: Secondary | ICD-10-CM | POA: Diagnosis not present

## 2015-05-31 DIAGNOSIS — N186 End stage renal disease: Secondary | ICD-10-CM | POA: Diagnosis not present

## 2015-05-31 DIAGNOSIS — B999 Unspecified infectious disease: Secondary | ICD-10-CM | POA: Diagnosis not present

## 2015-05-31 DIAGNOSIS — D631 Anemia in chronic kidney disease: Secondary | ICD-10-CM | POA: Diagnosis not present

## 2015-06-02 DIAGNOSIS — B999 Unspecified infectious disease: Secondary | ICD-10-CM | POA: Diagnosis not present

## 2015-06-02 DIAGNOSIS — N186 End stage renal disease: Secondary | ICD-10-CM | POA: Diagnosis not present

## 2015-06-02 DIAGNOSIS — N2581 Secondary hyperparathyroidism of renal origin: Secondary | ICD-10-CM | POA: Diagnosis not present

## 2015-06-02 DIAGNOSIS — D631 Anemia in chronic kidney disease: Secondary | ICD-10-CM | POA: Diagnosis not present

## 2015-06-05 DIAGNOSIS — B999 Unspecified infectious disease: Secondary | ICD-10-CM | POA: Diagnosis not present

## 2015-06-05 DIAGNOSIS — D631 Anemia in chronic kidney disease: Secondary | ICD-10-CM | POA: Diagnosis not present

## 2015-06-05 DIAGNOSIS — N186 End stage renal disease: Secondary | ICD-10-CM | POA: Diagnosis not present

## 2015-06-05 DIAGNOSIS — N2581 Secondary hyperparathyroidism of renal origin: Secondary | ICD-10-CM | POA: Diagnosis not present

## 2015-06-07 DIAGNOSIS — N186 End stage renal disease: Secondary | ICD-10-CM | POA: Diagnosis not present

## 2015-06-07 DIAGNOSIS — D631 Anemia in chronic kidney disease: Secondary | ICD-10-CM | POA: Diagnosis not present

## 2015-06-07 DIAGNOSIS — N2581 Secondary hyperparathyroidism of renal origin: Secondary | ICD-10-CM | POA: Diagnosis not present

## 2015-06-07 DIAGNOSIS — B999 Unspecified infectious disease: Secondary | ICD-10-CM | POA: Diagnosis not present

## 2015-06-09 DIAGNOSIS — N2581 Secondary hyperparathyroidism of renal origin: Secondary | ICD-10-CM | POA: Diagnosis not present

## 2015-06-09 DIAGNOSIS — D631 Anemia in chronic kidney disease: Secondary | ICD-10-CM | POA: Diagnosis not present

## 2015-06-09 DIAGNOSIS — N186 End stage renal disease: Secondary | ICD-10-CM | POA: Diagnosis not present

## 2015-06-09 DIAGNOSIS — B999 Unspecified infectious disease: Secondary | ICD-10-CM | POA: Diagnosis not present

## 2015-06-12 DIAGNOSIS — D631 Anemia in chronic kidney disease: Secondary | ICD-10-CM | POA: Diagnosis not present

## 2015-06-12 DIAGNOSIS — B999 Unspecified infectious disease: Secondary | ICD-10-CM | POA: Diagnosis not present

## 2015-06-12 DIAGNOSIS — N186 End stage renal disease: Secondary | ICD-10-CM | POA: Diagnosis not present

## 2015-06-12 DIAGNOSIS — N2581 Secondary hyperparathyroidism of renal origin: Secondary | ICD-10-CM | POA: Diagnosis not present

## 2015-06-14 DIAGNOSIS — B999 Unspecified infectious disease: Secondary | ICD-10-CM | POA: Diagnosis not present

## 2015-06-14 DIAGNOSIS — D631 Anemia in chronic kidney disease: Secondary | ICD-10-CM | POA: Diagnosis not present

## 2015-06-14 DIAGNOSIS — N2581 Secondary hyperparathyroidism of renal origin: Secondary | ICD-10-CM | POA: Diagnosis not present

## 2015-06-14 DIAGNOSIS — N186 End stage renal disease: Secondary | ICD-10-CM | POA: Diagnosis not present

## 2015-06-16 DIAGNOSIS — D631 Anemia in chronic kidney disease: Secondary | ICD-10-CM | POA: Diagnosis not present

## 2015-06-16 DIAGNOSIS — B999 Unspecified infectious disease: Secondary | ICD-10-CM | POA: Diagnosis not present

## 2015-06-16 DIAGNOSIS — N186 End stage renal disease: Secondary | ICD-10-CM | POA: Diagnosis not present

## 2015-06-16 DIAGNOSIS — N2581 Secondary hyperparathyroidism of renal origin: Secondary | ICD-10-CM | POA: Diagnosis not present

## 2015-06-18 DIAGNOSIS — B999 Unspecified infectious disease: Secondary | ICD-10-CM | POA: Diagnosis not present

## 2015-06-18 DIAGNOSIS — D631 Anemia in chronic kidney disease: Secondary | ICD-10-CM | POA: Diagnosis not present

## 2015-06-18 DIAGNOSIS — N2581 Secondary hyperparathyroidism of renal origin: Secondary | ICD-10-CM | POA: Diagnosis not present

## 2015-06-18 DIAGNOSIS — N186 End stage renal disease: Secondary | ICD-10-CM | POA: Diagnosis not present

## 2015-06-19 DIAGNOSIS — M549 Dorsalgia, unspecified: Secondary | ICD-10-CM | POA: Diagnosis not present

## 2015-06-20 DIAGNOSIS — N186 End stage renal disease: Secondary | ICD-10-CM | POA: Diagnosis not present

## 2015-06-20 DIAGNOSIS — B999 Unspecified infectious disease: Secondary | ICD-10-CM | POA: Diagnosis not present

## 2015-06-20 DIAGNOSIS — R52 Pain, unspecified: Secondary | ICD-10-CM | POA: Diagnosis not present

## 2015-06-20 DIAGNOSIS — N2581 Secondary hyperparathyroidism of renal origin: Secondary | ICD-10-CM | POA: Diagnosis not present

## 2015-06-20 DIAGNOSIS — D631 Anemia in chronic kidney disease: Secondary | ICD-10-CM | POA: Diagnosis not present

## 2015-06-23 DIAGNOSIS — B999 Unspecified infectious disease: Secondary | ICD-10-CM | POA: Diagnosis not present

## 2015-06-23 DIAGNOSIS — N2581 Secondary hyperparathyroidism of renal origin: Secondary | ICD-10-CM | POA: Diagnosis not present

## 2015-06-23 DIAGNOSIS — N186 End stage renal disease: Secondary | ICD-10-CM | POA: Diagnosis not present

## 2015-06-23 DIAGNOSIS — D631 Anemia in chronic kidney disease: Secondary | ICD-10-CM | POA: Diagnosis not present

## 2015-06-26 DIAGNOSIS — R300 Dysuria: Secondary | ICD-10-CM | POA: Diagnosis not present

## 2015-06-26 DIAGNOSIS — N2581 Secondary hyperparathyroidism of renal origin: Secondary | ICD-10-CM | POA: Diagnosis not present

## 2015-06-26 DIAGNOSIS — N186 End stage renal disease: Secondary | ICD-10-CM | POA: Diagnosis not present

## 2015-06-26 DIAGNOSIS — B999 Unspecified infectious disease: Secondary | ICD-10-CM | POA: Diagnosis not present

## 2015-06-26 DIAGNOSIS — D631 Anemia in chronic kidney disease: Secondary | ICD-10-CM | POA: Diagnosis not present

## 2015-06-27 DIAGNOSIS — N186 End stage renal disease: Secondary | ICD-10-CM | POA: Diagnosis not present

## 2015-06-27 DIAGNOSIS — I120 Hypertensive chronic kidney disease with stage 5 chronic kidney disease or end stage renal disease: Secondary | ICD-10-CM | POA: Diagnosis not present

## 2015-06-27 DIAGNOSIS — Z992 Dependence on renal dialysis: Secondary | ICD-10-CM | POA: Diagnosis not present

## 2015-06-28 DIAGNOSIS — N2581 Secondary hyperparathyroidism of renal origin: Secondary | ICD-10-CM | POA: Diagnosis not present

## 2015-06-28 DIAGNOSIS — D631 Anemia in chronic kidney disease: Secondary | ICD-10-CM | POA: Diagnosis not present

## 2015-06-28 DIAGNOSIS — N186 End stage renal disease: Secondary | ICD-10-CM | POA: Diagnosis not present

## 2015-06-30 DIAGNOSIS — D631 Anemia in chronic kidney disease: Secondary | ICD-10-CM | POA: Diagnosis not present

## 2015-06-30 DIAGNOSIS — N186 End stage renal disease: Secondary | ICD-10-CM | POA: Diagnosis not present

## 2015-06-30 DIAGNOSIS — N2581 Secondary hyperparathyroidism of renal origin: Secondary | ICD-10-CM | POA: Diagnosis not present

## 2015-07-03 DIAGNOSIS — N2581 Secondary hyperparathyroidism of renal origin: Secondary | ICD-10-CM | POA: Diagnosis not present

## 2015-07-03 DIAGNOSIS — N186 End stage renal disease: Secondary | ICD-10-CM | POA: Diagnosis not present

## 2015-07-03 DIAGNOSIS — D631 Anemia in chronic kidney disease: Secondary | ICD-10-CM | POA: Diagnosis not present

## 2015-07-05 DIAGNOSIS — N186 End stage renal disease: Secondary | ICD-10-CM | POA: Diagnosis not present

## 2015-07-05 DIAGNOSIS — N2581 Secondary hyperparathyroidism of renal origin: Secondary | ICD-10-CM | POA: Diagnosis not present

## 2015-07-05 DIAGNOSIS — D631 Anemia in chronic kidney disease: Secondary | ICD-10-CM | POA: Diagnosis not present

## 2015-07-07 DIAGNOSIS — N186 End stage renal disease: Secondary | ICD-10-CM | POA: Diagnosis not present

## 2015-07-07 DIAGNOSIS — N2581 Secondary hyperparathyroidism of renal origin: Secondary | ICD-10-CM | POA: Diagnosis not present

## 2015-07-07 DIAGNOSIS — D631 Anemia in chronic kidney disease: Secondary | ICD-10-CM | POA: Diagnosis not present

## 2015-07-10 DIAGNOSIS — N186 End stage renal disease: Secondary | ICD-10-CM | POA: Diagnosis not present

## 2015-07-10 DIAGNOSIS — D631 Anemia in chronic kidney disease: Secondary | ICD-10-CM | POA: Diagnosis not present

## 2015-07-10 DIAGNOSIS — N2581 Secondary hyperparathyroidism of renal origin: Secondary | ICD-10-CM | POA: Diagnosis not present

## 2015-07-11 ENCOUNTER — Ambulatory Visit (HOSPITAL_COMMUNITY): Payer: Medicare Other | Admitting: Physical Therapy

## 2015-07-11 ENCOUNTER — Encounter (HOSPITAL_COMMUNITY): Payer: Self-pay

## 2015-07-11 ENCOUNTER — Ambulatory Visit (HOSPITAL_COMMUNITY): Payer: Medicare Other | Attending: Family Medicine

## 2015-07-11 DIAGNOSIS — M5412 Radiculopathy, cervical region: Secondary | ICD-10-CM | POA: Diagnosis not present

## 2015-07-11 DIAGNOSIS — M25512 Pain in left shoulder: Secondary | ICD-10-CM | POA: Diagnosis not present

## 2015-07-11 DIAGNOSIS — M6248 Contracture of muscle, other site: Secondary | ICD-10-CM | POA: Insufficient documentation

## 2015-07-11 DIAGNOSIS — M542 Cervicalgia: Secondary | ICD-10-CM

## 2015-07-11 DIAGNOSIS — M25612 Stiffness of left shoulder, not elsewhere classified: Secondary | ICD-10-CM | POA: Diagnosis not present

## 2015-07-11 DIAGNOSIS — R29898 Other symptoms and signs involving the musculoskeletal system: Secondary | ICD-10-CM

## 2015-07-11 DIAGNOSIS — M62838 Other muscle spasm: Secondary | ICD-10-CM

## 2015-07-11 NOTE — Therapy (Signed)
[place], [place], [address] Phone: [phone]   Fax:  [phone]  Occupational Therapy Evaluation  Patient Details  Name: [name] MRN: [id number] Date of Birth: [birth year] Referring Provider: Criss Rosales  Encounter Date: 07/11/2015      OT End of Session - 07/11/15 1645    Visit Number 1   Number of Visits 12   Date for OT Re-Evaluation 09/09/15  Mini reassess: 08/08/15   Authorization Type 1) Medicare 2) Generic Commercial 3) Medicaid   Authorization Time Period before 10th visit   Authorization - Visit Number 1   Authorization - Number of Visits 10   OT Start Time 1345   OT Stop Time 1430   OT Time Calculation (min) 45 min   Activity Tolerance Patient tolerated treatment well   Behavior During Therapy [facility] for tasks assessed/performed      Past Medical History  Diagnosis Date  . Spina bifida   . UTI (lower urinary tract infection)   . HTN (hypertension) 05/02/2011  . Anemia associated with chronic renal failure   . 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  . Blood transfusion   . Dialysis care   . ESRD (end stage renal disease) on dialysis ([place])   . Depression 05/14/2015    Past Surgical History  Procedure Laterality Date  . Av fistula placement      left arm    There were no vitals filed for this visit.  Visit Diagnosis:  Pain in joint of left shoulder - Plan: Ot plan of care cert/re-cert  Shoulder stiffness, left - Plan: Ot plan of care cert/re-cert  Shoulder weakness - Plan: Ot plan of care cert/re-cert      Subjective Assessment - 07/11/15 1630    Subjective  S: I didn't do anything to my arm.    Pertinent History [name]) is a 24 y/o female S/P possible left rotator cuff which began approximately 2 months ago with no known injury. Patient reports that the MD did not perform an X-ray or a MRI so it is unknown if the rotator cuff is torn or she pulled a  muscle. Patient has been wheelchair bound since she was 25 years old and has been using a manual wheelchair due to [place]. Patient will be assessed by Physical therapy for complaints of back pain. Dr. Criss Rosales has referred patient to occupational therapy for evaluation and treatment.    Special Tests FOTO score: 52/100   Patient Stated Goals To be able to use my left arm normally without it hurting.    Currently in Pain? Yes   Pain Score 7    Pain Location Shoulder   Pain Orientation Left   Pain Descriptors / Indicators Aching   Pain Type Acute pain   Pain Radiating Towards Radiating to mid back            [facility] OT Assessment - 07/11/15 1400    Assessment   Diagnosis Possible RTC tear - Left   Referring Provider Criss Rosales   Onset Date --  2 months   Assessment 07/20/15 Criss Rosales   Prior Therapy None   Precautions   Precautions None   Restrictions   Weight Bearing Restrictions No   Balance Screen   Has the patient fallen in the past 6 months No   Home  Environment   Family/patient expects to be discharged to: Private residence   Living Arrangements Other relatives  Available Help at Discharge Family   Additional Comments Lives with Aunt and grandma   Prior Function   Level of Independence Requires assistive device for independence   Leisure paint   ADL   Equipment Used Wheelchair   ADL comments Difficulty transferring self, picking up heavy items, fixing hair, cooking, and wheelchair mobility.    Mobility   Mobility Status Independent   Mobility Status Comments With use of manual wheelchair   Written Expression   Dominant Hand Right   Vision - History   Baseline Vision No visual deficits   Cognition   Overall Cognitive Status Within Functional Limits for tasks assessed   ROM / Strength   AROM / PROM / Strength AROM;PROM;Strength   Palpation   Palpation comment Max fascial restrictions in left upper arm, upper trazious, and scapularis region. Increased trigger points  along medial border of left scapula.    AROM   Overall AROM Comments Assessed seated. IR/er adducted   AROM Assessment Site Shoulder   Right/Left Shoulder Left   Left Shoulder Flexion 115 Degrees   Left Shoulder ABduction 112 Degrees   Left Shoulder Internal Rotation 90 Degrees   Left Shoulder External Rotation 54 Degrees   PROM   Overall PROM Comments Assessed supine. IR/er adducted   PROM Assessment Site Shoulder   Right/Left Shoulder Left   Left Shoulder Flexion 135 Degrees   Left Shoulder ABduction 70 Degrees   Left Shoulder Internal Rotation 90 Degrees   Left Shoulder External Rotation 81 Degrees   Strength   Overall Strength Comments Assessed seated. IR/er adducted.   Strength Assessment Site Shoulder   Right/Left Shoulder Left   Left Shoulder Flexion 3/5   Left Shoulder ABduction 3-/5   Left Shoulder Internal Rotation 3/5   Left Shoulder External Rotation 3-/5                         OT Education - 07/11/15 1644    Education provided Yes   Education Details Table slides and seated scapular A/ROM exercises.   Person(s) Educated Patient   Methods Explanation;Demonstration;Verbal cues;Handout   Comprehension Verbalized understanding;Returned demonstration          OT Short Term Goals - 07/11/15 1648    OT SHORT TERM GOAL #1   Title Patient will be educated and indepdent with HEP to increase functional mobility of LUE.   Time 3   Period Weeks   Status New   OT SHORT TERM GOAL #2   Title Patient will increase P/ROM to WNL to increase ability to fix hair.    Time 3   Period Weeks   Status New   OT SHORT TERM GOAL #3   Title Patient will increase LUE strength to 3/5 to increase ability to seld propell wheelchair with less pain.    Time 3   Period Weeks   Status New   OT SHORT TERM GOAL #4   Title Patient will decrease pain level to 5/10 when using LUE.    Time 3   Period Weeks   Status New   OT SHORT TERM GOAL #5   Title Patient will  decrease fascial restrictions to mod amount to increase functional mobility of LUE.    Time 3   Period Weeks   Status New           OT Long Term Goals - 07/11/15 1650    OT LONG TERM GOAL #1   Title Patient will  return to highest level of independence with all daily tasks using LUE.   Time 6   Period Weeks   Status New   OT LONG TERM GOAL #2   Title Patient will increase LUE strength to 4/5 to increase functional performance during transfers.   Time 6   Period Weeks   OT LONG TERM GOAL #3   Title Patient will increase A/ROM to [facility] to increase ability to fix hair.   Time 6   Period Weeks   Status New   OT LONG TERM GOAL #4   Title Patient will decrease fascial restrictions to min amount to increase functional mobility needed to propell wheelchair.   Time 6   Period Weeks   Status New   OT LONG TERM GOAL #5   Title Patient will decrease pain level to 3/10 or less to be able to pick up heavy items and return to normal cooking tasks.   Time 6   Period Weeks   Status New               Plan - 2015/07/17 1646    Clinical Impression Statement A: Patient is a 24 y/o female S/P left possible rotator cuff tear causing increased pain and fascial restrictions and decreased strength and ROM resulting in difficulty completing daily tasks using LUE.    Pt will benefit from skilled therapeutic intervention in order to improve on the following deficits (Retired) Decreased strength;Pain;Impaired UE functional use;Decreased range of motion;Increased fascial restricitons   Rehab Potential Excellent   OT Frequency 2x / week   OT Duration 6 weeks   OT Treatment/Interventions Self-care/ADL training;Ultrasound;Iontophoresis;Parrafin;Passive range of motion;Patient/family education;Cryotherapy;Electrical Stimulation;Moist Heat;Therapeutic activities;Therapeutic exercises;Manual Therapy   Plan P: Patient will benefit from skilled OT services to increase functional performance during daily  tasks and wheelchair mobility using LUE. Next session: Myofascial release, manual stretching, therapy ball stretches, P/ROM, scapular A/ROM, thumb tacks.    Consulted and Agree with Plan of Care Patient          G-Codes - 2015-07-17 1654    Functional Assessment Tool Used FOTO score: 52/100(48% impaired)   Functional Limitation Carrying, moving and handling objects   Carrying, Moving and Handling Objects Current Status [id number]) At least 40 percent but less than 60 percent impaired, limited or restricted   Carrying, Moving and Handling Objects Goal Status [id number]) At least 20 percent but less than 40 percent impaired, limited or restricted      Problem List Patient Active Problem List   Diagnosis Date Noted  . Major depressive disorder, single episode, severe without psychotic features ([place]) 05/15/2015  . Spina bifida ([place]) 01/18/2012  . ESRD (end stage renal disease) on dialysis ([place]) 05/02/2011  . Anemia 05/02/2011  . HTN (hypertension) 05/02/2011    Ailene Ravel, OTR/L,CBIS  [phone]  07-17-15, 4:56 PM  [facility] [address] [place], [place], [address] Phone: [phone]   Fax:  [phone]  Name: NORVELL RAKE MRN: [id number] Date of Birth: 25-Mar-1991

## 2015-07-11 NOTE — Patient Instructions (Signed)
Complete each exercises for 1-3 minutes. 2-3X a day.   SHOULDER: Flexion On Table   Place hands on table, elbows straight. Move hips away from body. Press hands down into table.   Abduction (Passive)   With arm out to side, resting on table, lower head toward arm, keeping trunk away from table.    Copyright  VHI. All rights reserved.     Internal Rotation (Assistive)   Seated with elbow bent at right angle and held against side, slide arm on table surface in an inward arc. Marland Kitchen Activity: Use this motion to brush crumbs off the table.  Copyright  VHI. All rights reserved.   1) Seated Row   Sit up straight with elbows by your sides. Pull back with shoulders/elbows, keeping forearms straight, as if pulling back on the reins of a horse. Squeeze shoulder blades together. Repeat 10___times, ____sets/day    2) Shoulder Elevation    Sit up straight with arms by your sides. Slowly bring your shoulders up towards your ears. Repeat_10__times, ____ sets/day    3) Shoulder Extension    Sit up straight with both arms by your side, draw your arms back behind your waist. Keep your elbows straight. Repeat _10___times, ____sets/day.

## 2015-07-11 NOTE — Therapy (Addendum)
[place], [place], [address] Phone: [phone]   Fax:  [phone]  Physical Therapy Evaluation  Patient Details  Name: [name] MRN: [id number] Date of Birth: [birth year] Referring Provider: Criss Rosales  Encounter Date: 07/11/2015      PT End of Session - 07/11/15 1536    Visit Number 1   Number of Visits 8   Date for PT Re-Evaluation 08/10/15   Authorization Type Medicare   PT Start Time [id number]   PT Stop Time 1532   PT Time Calculation (min) 40 min   Activity Tolerance Other (comment)  At end of session pt felt dizzy requested to lie down; pulse 60 BP 118/75.  Pt has only ate a peach today; given ginger ale pt felt better.    Behavior During Therapy [facility] for tasks assessed/performed      Past Medical History  Diagnosis Date  . Spina bifida   . UTI (lower urinary tract infection)   . HTN (hypertension) 05/02/2011  . Anemia associated with chronic renal failure   . 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  . Blood transfusion   . Dialysis care   . ESRD (end stage renal disease) on dialysis ([place])   . Depression 05/14/2015    Past Surgical History  Procedure Laterality Date  . Av fistula placement      left arm    There were no vitals filed for this visit.  Visit Diagnosis:  Cervical radicular pain - Plan: PT plan of care cert/re-cert  Muscle spasms of neck - Plan: PT plan of care cert/re-cert  Neck pain - Plan: PT plan of care cert/re-cert      Subjective Assessment - 07/11/15 1456    Subjective [name] states that she had an insidious onset of cervical pain on her Lt side that progressed to the mid back area.  Her back pain is progressive and has been going on for two months.  The pain is greater when she sits.  She has a hx of scoliosis.  At this time she states she is not doing any exercises for her neck or back. but is taking medication prn for the pain.    Pertinent  History [name] was born with spina bifida and has been wheelchair bound since she was [age] years old.  She has end stage renal failure and is on Dialysis 3x/ week.  She is currently seeing OT for a possible Lt rotator cuff tear.     How long can you sit comfortably? an hour    Currently in Pain? Yes   Pain Score 7    Pain Location Neck   Pain Orientation Left   Pain Descriptors / Indicators Aching   Pain Radiating Towards radiating to mid back    Aggravating Factors  sitting    Pain Relieving Factors heat             [facility] PT Assessment - 07/11/15 1501    Assessment   Medical Diagnosis radicular cervical pain   Referring Provider Criss Rosales   Onset Date/Surgical Date 05/07/15  approximate   Hand Dominance Right   Prior Therapy OT currently   Precautions   Precautions None   Balance Screen   Has the patient fallen in the past 6 months No   Has the patient had a decrease in activity level because of a fear of falling?  No   Is the patient reluctant  to leave their home because of a fear of falling?  No   Prior Function   Level of Independence Requires assistive device for independence;Other (comment)   Leisure paint    Cognition   Overall Cognitive Status Within Functional Limits for tasks assessed   Posture/Postural Control   Posture/Postural Control Postural limitations   Posture Comments scoliosis   ROM / Strength   AROM / PROM / Strength AROM;Strength   AROM   AROM Assessment Site Cervical   Cervical Flexion wnl    Cervical Extension wnl  reps imrove pain.   Cervical - Right Side Bend decreased 20%   Cervical - Left Side Bend decreased 20%   Cervical - Right Rotation decreased 20%    Cervical - Left Rotation decreased 20%    Strength   Strength Assessment Site Cervical   Cervical Extension 2+/5   Cervical - Right Side Bend 2/5   Cervical - Left Side Bend 2/5   Palpation   Palpation comment moderate mm spasm in Lt upper trapezius mm.  Pt does not tolerate deep  pressure                            PT Education - 07/11/15 1535    Education provided Yes   Education Details HEP and the importance of posture    Person(s) Educated Patient   Methods Explanation;Verbal cues;Handout   Comprehension Verbalized understanding;Returned demonstration          PT Short Term Goals - 07/11/15 1549    PT SHORT TERM GOAL #1   Title I in HEP in order to decrease pain to improve pt quality of life.    Time 2   Period Weeks   Status New   PT SHORT TERM GOAL #2   Title PT cervical ROM to be wnl to be able to look to her side and converse with another person.    Time 2   Period Weeks   PT SHORT TERM GOAL #3   Title Pt strength improved to 3/5 to decrease pain to no greater than a 5/10 to improve pt quality of life.   Time 2   Period Weeks   Status New           PT Long Term Goals - 07/11/15 1551    PT LONG TERM GOAL #1   Title Pt to be I in advance HEP to be able to stabilize cervical area while using  B UE>    Time 4   Period Weeks   Status New   PT LONG TERM GOAL #2   Title PT to have no mm spasm to decrease pain to improve pt quality of life.    Time 4   Period Weeks   Status New   PT LONG TERM GOAL #3   Title Pt cervical mm to be at least a 4/5 to allow pain level to decrease to no greater than a 3/10 while performing functional activity.    Time 4   Period Weeks   Status New               Plan - 07/11/15 1539    Clinical Impression Statement [name] is a 24 yo female who has been been referred to OT for Lt rotator cuff syndrome and referred to PT for mid back pain.  Upon further investigation pt verbalizes that her pain starts in her cervical area and radiates into  her upper back.  Pt states that her Lt side is more painful than her Rt.  Examination demonstrates increased pain, mm spasm,  decreased cervical strength, decreased ROM and decreased  ability to stabilize her cervical area .  [name] will  benefit from skilled physical therapy to address these issues and improve her quality of life.    Pt will benefit from skilled therapeutic intervention in order to improve on the following deficits Decreased range of motion;Decreased strength;Pain;Increased muscle spasms   PT Frequency 2x / week   PT Duration 4 weeks   PT Treatment/Interventions Therapeutic exercise;Therapeutic activities;Patient/family education;Manual techniques   PT Next Visit Plan begin cervical isometric exercises trial of manual cervical traction, (gentle) , supine cervical retraction; sitting B shoulder flexion to 90 degrees while keeping head stabilized.    PT Home Exercise Plan given           G-Codes - 07/14/2015 1555    Functional Assessment Tool Used clinical judgement,(strength) and pain level.    Functional Limitation Changing and maintaining body position   Changing and Maintaining Body Position Current Status [phone]) At least 60 percent but less than 80 percent impaired, limited or restricted   Changing and Maintaining Body Position Goal Status [id number]) At least 20 percent but less than 40 percent impaired, limited or restricted       Problem List Patient Active Problem List   Diagnosis Date Noted  . Major depressive disorder, single episode, severe without psychotic features ([place]) 05/15/2015  . Spina bifida ([place]) 01/18/2012  . ESRD (end stage renal disease) on dialysis ([place]) 05/02/2011  . Anemia 05/02/2011  . HTN (hypertension) 05/02/2011    Rayetta Humphrey, PT CLT [phone] 07/12/2015, 10:27 AM  [facility] [address] [place], [place], [address] Phone: [phone]   Fax:  [phone]  Name: [name] MRN: [id number] Date of Birth: 04/10/1991

## 2015-07-11 NOTE — Patient Instructions (Signed)
Scapular Retraction (Prone)    Lie with arms at sides. Pinch shoulder blades together and raise arms a few inches from floor. Repeat ____ times per set. Do ____ sets per session. Do ____ sessions per day.  http://orth.exer.us/954   CAROM: Lateral Neck Flexion    Slowly tilt head toward one shoulder, then the other. Hold each position __5__ seconds. Repeat __10__ times per set. Do ___1_ sets per session. Do ___2_ sessions per day.  http://orth.exer.us/296   Copyright  VHI. All rights reserved.

## 2015-07-12 DIAGNOSIS — N2581 Secondary hyperparathyroidism of renal origin: Secondary | ICD-10-CM | POA: Diagnosis not present

## 2015-07-12 DIAGNOSIS — D631 Anemia in chronic kidney disease: Secondary | ICD-10-CM | POA: Diagnosis not present

## 2015-07-12 DIAGNOSIS — N186 End stage renal disease: Secondary | ICD-10-CM | POA: Diagnosis not present

## 2015-07-13 ENCOUNTER — Ambulatory Visit (HOSPITAL_COMMUNITY): Payer: Medicare Other | Admitting: Physical Therapy

## 2015-07-13 ENCOUNTER — Ambulatory Visit (HOSPITAL_COMMUNITY): Payer: Medicare Other | Admitting: Occupational Therapy

## 2015-07-13 ENCOUNTER — Encounter (HOSPITAL_COMMUNITY): Payer: Self-pay | Admitting: Occupational Therapy

## 2015-07-13 DIAGNOSIS — M25512 Pain in left shoulder: Secondary | ICD-10-CM | POA: Diagnosis not present

## 2015-07-13 DIAGNOSIS — R29898 Other symptoms and signs involving the musculoskeletal system: Secondary | ICD-10-CM

## 2015-07-13 DIAGNOSIS — M5412 Radiculopathy, cervical region: Secondary | ICD-10-CM | POA: Diagnosis not present

## 2015-07-13 DIAGNOSIS — M25612 Stiffness of left shoulder, not elsewhere classified: Secondary | ICD-10-CM

## 2015-07-13 DIAGNOSIS — M542 Cervicalgia: Secondary | ICD-10-CM

## 2015-07-13 DIAGNOSIS — M6248 Contracture of muscle, other site: Secondary | ICD-10-CM | POA: Diagnosis not present

## 2015-07-13 DIAGNOSIS — M62838 Other muscle spasm: Secondary | ICD-10-CM

## 2015-07-13 NOTE — Therapy (Addendum)
[place], [place], [address] Phone: [phone]   Fax:  [phone]  Physical Therapy Treatment  Patient Details  Name: [name] MRN: [id number] Date of Birth: 15-Oct-1990 Referring Provider: Criss Rosales  Encounter Date: 07/13/2015      PT End of Session - 07/13/15 1601    Visit Number 2   Number of Visits 8   Date for PT Re-Evaluation 08/10/15   Authorization Type Medicare   PT Start Time 1514   PT Stop Time 1547   PT Time Calculation (min) 33 min   Activity Tolerance Patient limited by pain   Behavior During Therapy [facility] for tasks assessed/performed      Past Medical History  Diagnosis Date  . Spina bifida   . UTI (lower urinary tract infection)   . HTN (hypertension) 05/02/2011  . Anemia associated with chronic renal failure   . 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  . Blood transfusion   . Dialysis care   . ESRD (end stage renal disease) on dialysis ([place])   . Depression 05/14/2015    Past Surgical History  Procedure Laterality Date  . Av fistula placement      left arm    There were no vitals filed for this visit.  Visit Diagnosis:  Cervical radicular pain  Muscle spasms of neck  Neck pain      Subjective Assessment - 07/13/15 1552    Subjective Pt states she has been to busy to complete any of her exercises.  Pt states that she had to take two tylenol's earlier today due to Lt scapular pain but has no pain  at the moment.   Currently in Pain? No/denies                         [facility] Adult PT Treatment/Exercise - 07/13/15 1554    Exercises   Exercises Neck   Neck Exercises: Seated   Shoulder Flexion Both  2 reps only due to pt intolerance states it hurts her chest.   Postural Training upper back isometric in wheelchair x 10    Neck Exercises: Supine   Cervical Isometrics Extension;Right lateral flexion;Left lateral flexion;5 reps   Neck  Retraction 10 reps   Other Supine Exercise scapular retraction x 10    Other Supine Exercise t-band supine postural exercises 1-5 x 5 reps each.    Manual Therapy   Manual Therapy Soft tissue mobilization;Manual Traction   Manual therapy comments pt very sensitive even to moderate pressure.    Manual Traction pt only able to tolerate minmal manual traction.                 PT Education - 07/13/15 1600    Education provided Yes   Education Details provided evaluation and reviewed with pt.  Educated in good sitting posture    Person(s) Educated Patient   Methods Explanation;Handout   Comprehension Verbalized understanding          PT Short Term Goals - 07/11/15 1549    PT SHORT TERM GOAL #1   Title I in HEP in order to decrease pain to improve pt quality of life.    Time 2   Period Weeks   Status New   PT SHORT TERM GOAL #2   Title PT cervical ROM to be wnl to be able to look to her side and converse with another person.  Time 2   Period Weeks   PT SHORT TERM GOAL #3   Title Pt strength improved to 3/5 to decrease pain to no greater than a 5/10 to improve pt quality of life.   Time 2   Period Weeks   Status New           PT Long Term Goals - 07/11/15 1551    PT LONG TERM GOAL #1   Title Pt to be I in advance HEP to be able to stabilize cervical area while using  B UE>    Time 4   Period Weeks   Status New   PT LONG TERM GOAL #2   Title PT to have no mm spasm to decrease pain to improve pt quality of life.    Time 4   Period Weeks   Status New   PT LONG TERM GOAL #3   Title Pt cervical mm to be at least a 4/5 to allow pain level to decrease to no greater than a 3/10 while performing functional activity.    Time 4   Period Weeks   Status New               Plan - 07/13/15 1602    Clinical Impression Statement Pt iniitial evaluation reviewed with therapist. Pt instructed in new postrual exercises with verbal cuing needed for proper technique.   Pt appears apprehensive to any exercise that causes slight discomfort and will need encouragement that feeling a little discomfort is not always bad.   Pt only able to tolerate very gentle  PROM    Pt will benefit from skilled therapeutic intervention in order to improve on the following deficits Decreased range of motion;Decreased strength;Pain;Increased muscle spasms   PT Next Visit Plan review tband postrual exercises give pt sheet and T-band if pt has good form.  Review upper back isometric.  begin upper trap stretch, as well as prone exercises      [id number] CJ [phone] CL   Problem List Patient Active Problem List   Diagnosis Date Noted  . Major depressive disorder, single episode, severe without psychotic features ([place]) 05/15/2015  . Spina bifida ([place]) 01/18/2012  . ESRD (end stage renal disease) on dialysis ([place]) 05/02/2011  . Anemia 05/02/2011  . HTN (hypertension) 05/02/2011  Rayetta Humphrey, PT CLT [phone] 07/13/2015, 4:07 PM  [place] [address] [place], [place], [address] Phone: [phone]   Fax:  [phone]  Name: [name] MRN: [id number] Date of Birth: 1990-11-07    PHYSICAL THERAPY DISCHARGE SUMMARY  Visits from Start of Care: 2  Current functional level related to goals / functional outcomes: same   Remaining deficits: same   Education / Equipment: HEP  Plan: Patient agrees to discharge.  Patient goals were not met. Patient is being discharged due to not returning since the last visit.  ?????       Rayetta Humphrey, [place] CLT [phone]

## 2015-07-13 NOTE — Therapy (Signed)
[place], [place], [address] Phone: [phone]   Fax:  [phone]  Occupational Therapy Treatment  Patient Details  Name: [name] MRN: [id number] Date of Birth: 09/10/1990 Referring Provider: Criss Rosales  Encounter Date: 07/13/2015      OT End of Session - 07/13/15 1529    Visit Number 2   Number of Visits 12   Date for OT Re-Evaluation 09/09/15  Mini reassess: 08/08/15   Authorization Type 1) Medicare 2) [employer] Commercial 3) Medicaid   Authorization Time Period before 10th visit   Authorization - Visit Number 2   Authorization - Number of Visits 10   OT Start Time 1412   OT Stop Time 1433   OT Time Calculation (min) 21 min   Activity Tolerance Patient tolerated treatment well   Behavior During Therapy [facility] for tasks assessed/performed      Past Medical History  Diagnosis Date  . Spina bifida   . UTI (lower urinary tract infection)   . HTN (hypertension) 05/02/2011  . Anemia associated with chronic renal failure   . 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  . Blood transfusion   . Dialysis care   . ESRD (end stage renal disease) on dialysis ([place])   . Depression 05/14/2015    Past Surgical History  Procedure Laterality Date  . Av fistula placement      left arm    There were no vitals filed for this visit.  Visit Diagnosis:  Pain in joint of left shoulder  Shoulder stiffness, left  Shoulder weakness      Subjective Assessment - 07/13/15 1413    Subjective  S: Sorry I'm late, my grandma lost track of time.    Currently in Pain? No/denies            [facility] OT Assessment - 07/13/15 1527    Assessment   Diagnosis Possible RTC tear - Left   Precautions   Precautions None                  OT Treatments/Exercises (OP) - 07/13/15 1414    Exercises   Exercises Shoulder   Shoulder Exercises: Seated   Elevation AROM;10 reps   Extension AROM;10 reps   Row AROM;10 reps   Protraction PROM;10 reps   Horizontal ABduction PROM;10 reps   External Rotation PROM;10 reps   Internal Rotation PROM;10 reps   Flexion PROM;10 reps   Abduction PROM;10 reps   Shoulder Exercises: Therapy Ball   Flexion 10 reps   ABduction 10 reps   Shoulder Exercises: ROM/Strengthening   Thumb Tacks 1' 1 extended rest break                OT Education - 07/13/15 1528    Education provided Yes   Education Details provided evaluation and reviewed with patient   Person(s) Educated Patient   Methods Explanation;Handout   Comprehension Verbalized understanding          OT Short Term Goals - 07/13/15 1531    OT SHORT TERM GOAL #1   Title Patient will be educated and indepdent with HEP to increase functional mobility of LUE.   Time 3   Period Weeks   Status On-going   OT SHORT TERM GOAL #2   Title Patient will increase P/ROM to WNL to increase ability to fix hair.    Time 3   Period Weeks   Status On-going  OT SHORT TERM GOAL #3   Title Patient will increase LUE strength to 3/5 to increase ability to seld propell wheelchair with less pain.    Time 3   Period Weeks   Status On-going   OT SHORT TERM GOAL #4   Title Patient will decrease pain level to 5/10 when using LUE.    Time 3   Period Weeks   Status On-going   OT SHORT TERM GOAL #5   Title Patient will decrease fascial restrictions to mod amount to increase functional mobility of LUE.    Time 3   Period Weeks   Status On-going           OT Long Term Goals - 07/13/15 1531    OT LONG TERM GOAL #1   Title Patient will return to highest level of independence with all daily tasks using LUE.   Time 6   Period Weeks   Status On-going   OT LONG TERM GOAL #2   Title Patient will increase LUE strength to 4/5 to increase functional performance during transfers.   Time 6   Period Weeks   Status On-going   OT LONG TERM GOAL #3   Title Patient will increase A/ROM to [facility] to increase  ability to fix hair.   Time 6   Period Weeks   Status On-going   OT LONG TERM GOAL #4   Title Patient will decrease fascial restrictions to min amount to increase functional mobility needed to propell wheelchair.   Time 6   Period Weeks   Status On-going   OT LONG TERM GOAL #5   Title Patient will decrease pain level to 3/10 or less to be able to pick up heavy items and return to normal cooking tasks.   Time 6   Period Weeks   Status On-going               Plan - 07/13/15 1529    Clinical Impression Statement A: Pt arrived at 2:12 for 1:45 appointment, explained to pt we could only complete a short session today due to late arrival. Initiated P/ROM, scapular A/ROM, therapy ball exercises, and thumb tacks today. Provided evaluation and reviewed with pt.    Plan P: Initiate myofascial release, AA/ROM exercises.         Problem List Patient Active Problem List   Diagnosis Date Noted  . Major depressive disorder, single episode, severe without psychotic features ([place]) 05/15/2015  . Spina bifida ([place]) 01/18/2012  . ESRD (end stage renal disease) on dialysis ([place]) 05/02/2011  . Anemia 05/02/2011  . HTN (hypertension) 05/02/2011    Guadelupe Sabin, OTR/L  [phone]  07/13/2015, 3:32 PM  [place], [place], [address] Phone: [phone]   Fax:  [phone]  Name: [name] MRN: [id number] Date of Birth: 12/24/1990

## 2015-07-14 DIAGNOSIS — D631 Anemia in chronic kidney disease: Secondary | ICD-10-CM | POA: Diagnosis not present

## 2015-07-14 DIAGNOSIS — N186 End stage renal disease: Secondary | ICD-10-CM | POA: Diagnosis not present

## 2015-07-14 DIAGNOSIS — R300 Dysuria: Secondary | ICD-10-CM | POA: Diagnosis not present

## 2015-07-14 DIAGNOSIS — N2581 Secondary hyperparathyroidism of renal origin: Secondary | ICD-10-CM | POA: Diagnosis not present

## 2015-07-16 ENCOUNTER — Ambulatory Visit (HOSPITAL_COMMUNITY): Payer: Medicare Other | Admitting: Physical Therapy

## 2015-07-16 ENCOUNTER — Telehealth (HOSPITAL_COMMUNITY): Payer: Self-pay | Admitting: Physical Therapy

## 2015-07-16 NOTE — Telephone Encounter (Signed)
Attempted to contact pt re:  Missed appointment.  Left message stating next appointment time.   Rayetta Humphrey, [place] CLT [phone]

## 2015-07-17 ENCOUNTER — Encounter (HOSPITAL_COMMUNITY): Payer: Self-pay | Admitting: Occupational Therapy

## 2015-07-17 ENCOUNTER — Ambulatory Visit (HOSPITAL_COMMUNITY): Payer: Medicare Other | Admitting: Physical Therapy

## 2015-07-17 DIAGNOSIS — N186 End stage renal disease: Secondary | ICD-10-CM | POA: Diagnosis not present

## 2015-07-17 DIAGNOSIS — N2581 Secondary hyperparathyroidism of renal origin: Secondary | ICD-10-CM | POA: Diagnosis not present

## 2015-07-17 DIAGNOSIS — D631 Anemia in chronic kidney disease: Secondary | ICD-10-CM | POA: Diagnosis not present

## 2015-07-18 ENCOUNTER — Ambulatory Visit (HOSPITAL_COMMUNITY): Payer: Medicare Other | Admitting: Physical Therapy

## 2015-07-18 ENCOUNTER — Telehealth (HOSPITAL_COMMUNITY): Payer: Self-pay | Admitting: Physical Therapy

## 2015-07-18 ENCOUNTER — Ambulatory Visit (HOSPITAL_COMMUNITY): Payer: Medicare Other | Admitting: Occupational Therapy

## 2015-07-18 NOTE — Telephone Encounter (Signed)
Pt states that she is  unable to come in, however, pt did not call to cancel appointment.  Per policy pt was informed that her next appointment is on January 4th at 1:45.  All other appointments have been cancelled..  Pt was informed that she will need to schedule further appointments when she comes to her 1/4 appointment.  Rayetta Humphrey, [place] CLT [phone]

## 2015-07-19 ENCOUNTER — Encounter (HOSPITAL_COMMUNITY): Payer: Self-pay | Admitting: Occupational Therapy

## 2015-07-19 DIAGNOSIS — D631 Anemia in chronic kidney disease: Secondary | ICD-10-CM | POA: Diagnosis not present

## 2015-07-19 DIAGNOSIS — N2581 Secondary hyperparathyroidism of renal origin: Secondary | ICD-10-CM | POA: Diagnosis not present

## 2015-07-19 DIAGNOSIS — N186 End stage renal disease: Secondary | ICD-10-CM | POA: Diagnosis not present

## 2015-07-21 DIAGNOSIS — N186 End stage renal disease: Secondary | ICD-10-CM | POA: Diagnosis not present

## 2015-07-21 DIAGNOSIS — N2581 Secondary hyperparathyroidism of renal origin: Secondary | ICD-10-CM | POA: Diagnosis not present

## 2015-07-21 DIAGNOSIS — D631 Anemia in chronic kidney disease: Secondary | ICD-10-CM | POA: Diagnosis not present

## 2015-07-24 ENCOUNTER — Encounter (HOSPITAL_COMMUNITY): Payer: Self-pay | Admitting: Physical Therapy

## 2015-07-24 DIAGNOSIS — N186 End stage renal disease: Secondary | ICD-10-CM | POA: Diagnosis not present

## 2015-07-24 DIAGNOSIS — D631 Anemia in chronic kidney disease: Secondary | ICD-10-CM | POA: Diagnosis not present

## 2015-07-24 DIAGNOSIS — N2581 Secondary hyperparathyroidism of renal origin: Secondary | ICD-10-CM | POA: Diagnosis not present

## 2015-07-26 DIAGNOSIS — N186 End stage renal disease: Secondary | ICD-10-CM | POA: Diagnosis not present

## 2015-07-26 DIAGNOSIS — D631 Anemia in chronic kidney disease: Secondary | ICD-10-CM | POA: Diagnosis not present

## 2015-07-26 DIAGNOSIS — N2581 Secondary hyperparathyroidism of renal origin: Secondary | ICD-10-CM | POA: Diagnosis not present

## 2015-07-28 DIAGNOSIS — D631 Anemia in chronic kidney disease: Secondary | ICD-10-CM | POA: Diagnosis not present

## 2015-07-28 DIAGNOSIS — Z992 Dependence on renal dialysis: Secondary | ICD-10-CM | POA: Diagnosis not present

## 2015-07-28 DIAGNOSIS — I120 Hypertensive chronic kidney disease with stage 5 chronic kidney disease or end stage renal disease: Secondary | ICD-10-CM | POA: Diagnosis not present

## 2015-07-28 DIAGNOSIS — N2581 Secondary hyperparathyroidism of renal origin: Secondary | ICD-10-CM | POA: Diagnosis not present

## 2015-07-28 DIAGNOSIS — N186 End stage renal disease: Secondary | ICD-10-CM | POA: Diagnosis not present

## 2015-07-30 DIAGNOSIS — M1631 Unilateral osteoarthritis resulting from hip dysplasia, right hip: Secondary | ICD-10-CM | POA: Diagnosis not present

## 2015-07-30 DIAGNOSIS — M189 Osteoarthritis of first carpometacarpal joint, unspecified: Secondary | ICD-10-CM | POA: Diagnosis not present

## 2015-07-30 DIAGNOSIS — M75102 Unspecified rotator cuff tear or rupture of left shoulder, not specified as traumatic: Secondary | ICD-10-CM | POA: Diagnosis not present

## 2015-07-30 DIAGNOSIS — E782 Mixed hyperlipidemia: Secondary | ICD-10-CM | POA: Diagnosis not present

## 2015-07-31 ENCOUNTER — Encounter (HOSPITAL_COMMUNITY): Payer: Self-pay | Admitting: Emergency Medicine

## 2015-07-31 ENCOUNTER — Inpatient Hospital Stay (HOSPITAL_COMMUNITY)
Admission: EM | Admit: 2015-07-31 | Discharge: 2015-08-03 | DRG: 690 | Disposition: A | Discharge status: Home / Self Care | Payer: Medicare Other | Attending: Family Medicine | Admitting: Family Medicine

## 2015-07-31 ENCOUNTER — Emergency Department (HOSPITAL_COMMUNITY): Payer: Medicare Other

## 2015-07-31 DIAGNOSIS — Z91040 Latex allergy status: Secondary | ICD-10-CM | POA: Diagnosis not present

## 2015-07-31 DIAGNOSIS — I10 Essential (primary) hypertension: Secondary | ICD-10-CM | POA: Diagnosis not present

## 2015-07-31 DIAGNOSIS — F329 Major depressive disorder, single episode, unspecified: Secondary | ICD-10-CM | POA: Diagnosis not present

## 2015-07-31 DIAGNOSIS — Q631 Lobulated, fused and horseshoe kidney: Secondary | ICD-10-CM

## 2015-07-31 DIAGNOSIS — R109 Unspecified abdominal pain: Secondary | ICD-10-CM

## 2015-07-31 DIAGNOSIS — K5909 Other constipation: Secondary | ICD-10-CM | POA: Diagnosis not present

## 2015-07-31 DIAGNOSIS — N319 Neuromuscular dysfunction of bladder, unspecified: Secondary | ICD-10-CM | POA: Diagnosis present

## 2015-07-31 DIAGNOSIS — K6389 Other specified diseases of intestine: Secondary | ICD-10-CM | POA: Diagnosis not present

## 2015-07-31 DIAGNOSIS — Z79899 Other long term (current) drug therapy: Secondary | ICD-10-CM | POA: Diagnosis not present

## 2015-07-31 DIAGNOSIS — N12 Tubulo-interstitial nephritis, not specified as acute or chronic: Secondary | ICD-10-CM | POA: Diagnosis not present

## 2015-07-31 DIAGNOSIS — Q059 Spina bifida, unspecified: Secondary | ICD-10-CM | POA: Diagnosis not present

## 2015-07-31 DIAGNOSIS — I120 Hypertensive chronic kidney disease with stage 5 chronic kidney disease or end stage renal disease: Secondary | ICD-10-CM | POA: Diagnosis not present

## 2015-07-31 DIAGNOSIS — K5900 Constipation, unspecified: Secondary | ICD-10-CM | POA: Diagnosis not present

## 2015-07-31 DIAGNOSIS — M549 Dorsalgia, unspecified: Secondary | ICD-10-CM

## 2015-07-31 DIAGNOSIS — Z992 Dependence on renal dialysis: Secondary | ICD-10-CM | POA: Diagnosis not present

## 2015-07-31 DIAGNOSIS — N186 End stage renal disease: Secondary | ICD-10-CM | POA: Diagnosis not present

## 2015-07-31 DIAGNOSIS — D631 Anemia in chronic kidney disease: Secondary | ICD-10-CM | POA: Diagnosis not present

## 2015-07-31 DIAGNOSIS — E8889 Other specified metabolic disorders: Secondary | ICD-10-CM | POA: Diagnosis not present

## 2015-07-31 DIAGNOSIS — N2581 Secondary hyperparathyroidism of renal origin: Secondary | ICD-10-CM | POA: Diagnosis not present

## 2015-07-31 LAB — CBC WITH DIFFERENTIAL/PLATELET
Basophils Absolute: 0 10*3/uL (ref 0.0–0.1)
Basophils Relative: 0 %
EOS PCT: 1 %
Eosinophils Absolute: 0.1 10*3/uL (ref 0.0–0.7)
HEMATOCRIT: 38.4 % (ref 36.0–46.0)
Hemoglobin: 12.2 g/dL (ref 12.0–15.0)
LYMPHS ABS: 1.2 10*3/uL (ref 0.7–4.0)
LYMPHS PCT: 13 %
MCH: 30 pg (ref 26.0–34.0)
MCHC: 31.8 g/dL (ref 30.0–36.0)
MCV: 94.6 fL (ref 78.0–100.0)
MONO ABS: 0.7 10*3/uL (ref 0.1–1.0)
Monocytes Relative: 7 %
Neutro Abs: 7.7 10*3/uL (ref 1.7–7.7)
Neutrophils Relative %: 79 %
PLATELETS: 210 10*3/uL (ref 150–400)
RBC: 4.06 MIL/uL (ref 3.87–5.11)
RDW: 15.7 % — AB (ref 11.5–15.5)
WBC: 9.7 10*3/uL (ref 4.0–10.5)

## 2015-07-31 LAB — COMPREHENSIVE METABOLIC PANEL
ALT: 12 U/L — AB (ref 14–54)
AST: 18 U/L (ref 15–41)
Albumin: 4.2 g/dL (ref 3.5–5.0)
Alkaline Phosphatase: 142 U/L — ABNORMAL HIGH (ref 38–126)
Anion gap: 11 (ref 5–15)
BILIRUBIN TOTAL: 0.5 mg/dL (ref 0.3–1.2)
BUN: 59 mg/dL — ABNORMAL HIGH (ref 6–20)
CHLORIDE: 107 mmol/L (ref 101–111)
CO2: 24 mmol/L (ref 22–32)
CREATININE: 6.45 mg/dL — AB (ref 0.44–1.00)
Calcium: 9.5 mg/dL (ref 8.9–10.3)
GFR, EST AFRICAN AMERICAN: 10 mL/min — AB (ref >60)
GFR, EST NON AFRICAN AMERICAN: 8 mL/min — AB (ref >60)
Glucose, Bld: 97 mg/dL (ref 65–99)
Potassium: 4.9 mmol/L (ref 3.5–5.1)
Sodium: 142 mmol/L (ref 135–145)
TOTAL PROTEIN: 7.8 g/dL (ref 6.5–8.1)

## 2015-07-31 LAB — MRSA PCR SCREENING: MRSA BY PCR: NEGATIVE

## 2015-07-31 LAB — I-STAT CG4 LACTIC ACID, ED: Lactic Acid, Venous: 1.52 mmol/L (ref 0.5–2.0)

## 2015-07-31 LAB — URINALYSIS, ROUTINE W REFLEX MICROSCOPIC
BILIRUBIN URINE: NEGATIVE
GLUCOSE, UA: 100 mg/dL — AB
Ketones, ur: NEGATIVE mg/dL
Nitrite: NEGATIVE
PH: 8 (ref 5.0–8.0)
Protein, ur: 100 mg/dL — AB
SPECIFIC GRAVITY, URINE: 1.015 (ref 1.005–1.030)

## 2015-07-31 LAB — URINE MICROSCOPIC-ADD ON

## 2015-07-31 LAB — LIPASE, BLOOD: LIPASE: 30 U/L (ref 11–51)

## 2015-07-31 LAB — I-STAT BETA HCG BLOOD, ED (MC, WL, AP ONLY): I-stat hCG, quantitative: <5 m[IU]/mL (ref <5)

## 2015-07-31 MED ORDER — MORPHINE SULFATE (PF) 4 MG/ML IV SOLN
4.0000 mg | Freq: Once | INTRAVENOUS | Status: AC
  Administered 2015-07-31: 4 mg via INTRAVENOUS
  Filled 2015-07-31: qty 1

## 2015-07-31 MED ORDER — ACETAMINOPHEN 325 MG PO TABS: 650.0000 mg | ORAL_TABLET | Freq: Four times a day (QID) | ORAL | Status: DC | PRN

## 2015-07-31 MED ORDER — TRAZODONE HCL 50 MG PO TABS: 150.0000 mg | ORAL_TABLET | Freq: Every evening | ORAL | Status: DC | PRN

## 2015-07-31 MED ORDER — DEXTROSE 5 % IV SOLN
1.0000 g | Freq: Once | INTRAVENOUS | Status: AC
  Administered 2015-07-31: 1 g via INTRAVENOUS
  Filled 2015-07-31: qty 10

## 2015-07-31 MED ORDER — HEPARIN SODIUM (PORCINE) 5000 UNIT/ML IJ SOLN
5000.0000 [IU] | Freq: Three times a day (TID) | INTRAMUSCULAR | Status: DC
  Administered 2015-08-01: 5000 [IU] via SUBCUTANEOUS
  Filled 2015-07-31 (×2): qty 1

## 2015-07-31 MED ORDER — MORPHINE SULFATE (PF) 2 MG/ML IV SOLN
1.0000 mg | INTRAVENOUS | Status: DC | PRN
  Administered 2015-07-31 – 2015-08-02 (×4): 1 mg via INTRAVENOUS
  Filled 2015-07-31 (×4): qty 1

## 2015-07-31 MED ORDER — CINACALCET HCL 30 MG PO TABS
30.0000 mg | ORAL_TABLET | Freq: Every day | ORAL | Status: DC
  Administered 2015-08-01 – 2015-08-03 (×3): 30 mg via ORAL
  Filled 2015-07-31 (×6): qty 1

## 2015-07-31 MED ORDER — SEVELAMER CARBONATE 800 MG PO TABS
800.0000 mg | ORAL_TABLET | Freq: Three times a day (TID) | ORAL | Status: DC
  Administered 2015-08-01 – 2015-08-03 (×3): 800 mg via ORAL
  Filled 2015-07-31 (×3): qty 1

## 2015-07-31 MED ORDER — RENA-VITE PO TABS
1.0000 | ORAL_TABLET | Freq: Every day | ORAL | Status: DC
  Administered 2015-08-01 – 2015-08-02 (×2): 1 via ORAL
  Filled 2015-07-31 (×2): qty 1

## 2015-07-31 MED ORDER — SODIUM CHLORIDE 0.9 % IJ SOLN
3.0000 mL | Freq: Two times a day (BID) | INTRAMUSCULAR | Status: DC
  Administered 2015-07-31 – 2015-08-03 (×4): 3 mL via INTRAVENOUS

## 2015-07-31 MED ORDER — CEFTRIAXONE SODIUM 1 G IJ SOLR: 1.0000 g | INTRAMUSCULAR | Status: DC

## 2015-07-31 MED ORDER — DEXTROSE 5 % IV SOLN
1.0000 g | INTRAVENOUS | Status: DC
  Administered 2015-08-01 – 2015-08-02 (×2): 1 g via INTRAVENOUS
  Filled 2015-07-31 (×5): qty 10

## 2015-07-31 MED ORDER — SODIUM CHLORIDE 0.9 % IV SOLN: 250.0000 mL | INTRAVENOUS | Status: DC | PRN

## 2015-07-31 MED ORDER — DOCUSATE SODIUM 100 MG PO CAPS
100.0000 mg | ORAL_CAPSULE | Freq: Two times a day (BID) | ORAL | Status: DC
  Administered 2015-07-31 – 2015-08-03 (×5): 100 mg via ORAL
  Filled 2015-07-31 (×5): qty 1

## 2015-07-31 MED ORDER — SERTRALINE HCL 50 MG PO TABS
25.0000 mg | ORAL_TABLET | Freq: Every day | ORAL | Status: DC
  Administered 2015-08-01 – 2015-08-03 (×3): 25 mg via ORAL
  Filled 2015-07-31 (×3): qty 1

## 2015-07-31 MED ORDER — VITAMIN D (ERGOCALCIFEROL) 1.25 MG (50000 UNIT) PO CAPS
50000.0000 [IU] | ORAL_CAPSULE | ORAL | Status: DC
  Filled 2015-07-31: qty 1

## 2015-07-31 MED ORDER — ACETAMINOPHEN 650 MG RE SUPP: 650.0000 mg | Freq: Four times a day (QID) | RECTAL | Status: DC | PRN

## 2015-07-31 MED ORDER — MAGNESIUM CITRATE PO SOLN: 1.0000 | Freq: Once | ORAL | Status: DC | PRN

## 2015-07-31 MED ORDER — ONDANSETRON HCL 4 MG/2ML IJ SOLN
4.0000 mg | Freq: Once | INTRAMUSCULAR | Status: AC
  Administered 2015-07-31: 4 mg via INTRAMUSCULAR
  Filled 2015-07-31: qty 2

## 2015-07-31 MED ORDER — SODIUM CHLORIDE 0.9 % IJ SOLN: 3.0000 mL | INTRAMUSCULAR | Status: DC | PRN

## 2015-07-31 MED ORDER — ENOXAPARIN SODIUM 30 MG/0.3ML ~~LOC~~ SOLN: 30.0000 mg | SUBCUTANEOUS | Status: DC

## 2015-07-31 MED ORDER — SENNA 8.6 MG PO TABS
1.0000 | ORAL_TABLET | Freq: Two times a day (BID) | ORAL | Status: DC
  Administered 2015-07-31 – 2015-08-03 (×5): 8.6 mg via ORAL
  Filled 2015-07-31 (×5): qty 1

## 2015-07-31 MED ORDER — ONDANSETRON HCL 4 MG/2ML IJ SOLN
4.0000 mg | Freq: Once | INTRAMUSCULAR | Status: AC
  Administered 2015-07-31: 4 mg via INTRAVENOUS
  Filled 2015-07-31: qty 2

## 2015-07-31 MED ORDER — ONDANSETRON HCL 4 MG PO TABS: 4.0000 mg | ORAL_TABLET | Freq: Four times a day (QID) | ORAL | Status: DC | PRN

## 2015-07-31 MED ORDER — SERTRALINE HCL 50 MG PO TABS: 25.0000 mg | ORAL_TABLET | Freq: Every day | ORAL | Status: DC

## 2015-07-31 MED ORDER — ONDANSETRON HCL 4 MG/2ML IJ SOLN
4.0000 mg | Freq: Four times a day (QID) | INTRAMUSCULAR | Status: DC | PRN
  Administered 2015-08-01 – 2015-08-02 (×3): 4 mg via INTRAVENOUS
  Filled 2015-07-31 (×3): qty 2

## 2015-07-31 NOTE — ED Provider Notes (Signed)
CSN: [id number]     Arrival date & time 07/31/15  1154 History  By signing my na me below, I, [name], attest that this documentation has been prepared under the direction and in the presence of [place]*. Electronically Signed: Terressa Haynes, ED Scribe. 07/31/2015. 12:46 PM.  Chief Complaint  Patient presents with  . Abdominal Pain   The history is provided by the patient. No language interpreter was used.   PCP: No PCP Per Patient HPI Comments: [name] is a 25 y.o. female, with PMHx noted below including ESRD and dialysis care for past 6 years (T, R, Sat), and is wheelchair bound, who presents to the Emergency Department from her dialysis appointment, complaining of atraumatic, intermittent, worsening, severe, 10/10 abd pain and back pain onset yesterday and worsening today. Pt notes while she has experienced abd and back pain in the past, the pain has never been this severe. Pt denies any improving factors, but reports lying down aggravates the pain. Pt reports using ibuprofen and tylenol at home without relief. Pt notes she was not able to have her dialysis appointment today due to the pain. Associated Sx include: vomiting (one episode yesterday), diarrhea (multiple episodes yesterday--pt notes her last normal BM was 3 days ago), and decreased appetite. Pt denies sick contact. Pt denies cough, rhinorrhea, sore throat, chest pain, SOB. Pt denies Hx of abd surgeries. Pt denies Hx of DM.   Past Medical History  Diagnosis Date  . Spina bifida   . UTI (lower urinary tract infection)   . HTN (hypertension) 05/02/2011  . Anemia associated with chronic renal failure   . 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  . Blood transfusion   . Dialysis care   . ESRD (end stage renal disease) on dialysis ([place])   . Depression 05/14/2015   Past Surgical History  Procedure Laterality Date  . Av fistula placement      left arm   Family History  Problem  Relation Age of Onset  . Kidney cancer Other    Social History  Substance Use Topics  . Smoking status: Never Smoker   . Smokeless tobacco: None  . Alcohol Use: No   OB History    No data available     Review of Systems  A complete 10 system review of systems was obtained and all systems are negative except as noted in the HPI and PMH.   Allergies  Ciprofloxacin; Other; Peanut-containing drug products; Aleve; Influenza vaccines; Tetanus toxoids; and Latex  Home Medications   Prior to Admission medications   Medication Sig Start Date End Date Taking? Authorizing Provider  cinacalcet (SENSIPAR) 30 MG tablet Take 30 mg by mouth daily.   Yes Historical Provider, MD  Epoetin Alfa (EPOGEN IJ) Inject as directed. Pt gets on dialysis days which are Tuesday, Thursday, and Saturday.   Yes Historical Provider, MD  multivitamin (RENA-VIT) TABS tablet Take 1 tablet by mouth daily.   Yes Historical Provider, MD  sertraline (ZOLOFT) 25 MG tablet Take 1 tablet (25 mg total) by mouth daily. 05/18/15  Yes Hildred Priest, MD  sevelamer carbonate (RENVELA) 800 MG tablet Take 800 mg by mouth 3 (three) times daily with meals.    Yes Historical Provider, MD  traZODone (DESYREL) 150 MG tablet Take 1 tablet (150 mg total) by mouth at bedtime as needed for sleep. 05/18/15  Yes Hildred Priest, MD  Vitamin D, Ergocalciferol, (DRISDOL) 50000 UNITS CAPS capsule Take  50,000 Units by mouth every 7 (seven) days. Takes on Monday.   Yes Historical Provider, MD   Triage Vitals: Pulse 109  Temp(Src) 98 F (36.7 C) (Oral)  Resp 24  Wt 60 lb (27.216 kg)  SpO2 97% Physical Exam  Constitutional: She is oriented to person, place, and time. She appears well-developed and well-nourished. No distress.  Uncomfortable appearing.    HENT:  Head: Normocephalic and atraumatic.  Mouth/Throat: Oropharynx is clear and moist. No oropharyngeal exudate.  Eyes: Conjunctivae and EOM are normal. Pupils are  equal, round, and reactive to light.  Neck: Normal range of motion. Neck supple.  No meningismus.  Cardiovascular: Normal rate, regular rhythm, normal heart sounds and intact distal pulses.   No murmur heard. Pulmonary/Chest: Effort normal and breath sounds normal. No respiratory distress.  Abdominal: Soft. There is tenderness (Abd diffusely tender.). There is no rebound and no guarding.  Genitourinary:  Chaperone present.  There is no stool palpable in rectal vault. There is some stool just pass fingertip.  Musculoskeletal: Normal range of motion. She exhibits no edema or tenderness.  Dialysis fistula right upper arm with thrill.  Deformity to lumbar spine and lower extremities with contractures. Paraspinal lumbar tenderness bilaterally.    Neurological: She is alert and oriented to person, place, and time. No cranial nerve deficit. She exhibits normal muscle tone. Coordination normal.  No ataxia on finger to nose bilaterally. No pronator drift. 5/5 strength of upper extremities. CN 2-12 intact.Equal grip strength. Sensation intact.   Skin: Skin is warm.  Psychiatric: She has a normal mood and affect. Her behavior is normal.  Nursing note and vitals reviewed.   ED Course  Procedures (including critical care time) DIAGNOSTIC STUDIES: Oxygen Saturation is 97% on ra, nl by my interpretation.    COORDINATION OF CARE: 12:42 PM: Discussed treatment plan which includes meds, labs, and imaging with pt at bedside; patient verbalizes understanding and agrees with treatment plan.  Labs Review Labs Reviewed  CBC WITH DIFFERENTIAL/PLATELET - Abnormal; Notable for the following:    RDW 15.7 (*)    All other components within normal limits  COMPREHENSIVE METABOLIC PANEL - Abnormal; Notable for the following:    BUN 59 (*)    Creatinine, Ser 6.45 (*)    ALT 12 (*)    Alkaline Phosphatase 142 (*)    GFR calc non Af Amer 8 (*)    GFR calc Af Amer 10 (*)    All other components within normal  limits  URINALYSIS, ROUTINE W REFLEX MICROSCOPIC (NOT AT [facility]) - Abnormal; Notable for the following:    Glucose, UA 100 (*)    Hgb urine dipstick SMALL (*)    Protein, ur 100 (*)    Leukocytes, UA LARGE (*)    All other components within normal limits  URINE MICROSCOPIC-ADD ON - Abnormal; Notable for the following:    Squamous Epithelial / LPF 0-5 (*)    Bacteria, UA MANY (*)    All other components within normal limits  URINE CULTURE  MRSA PCR SCREENING  LIPASE, BLOOD  BASIC METABOLIC PANEL  CBC  I-STAT BETA HCG BLOOD, ED ([facility], [facility], [facility] ONLY)  I-STAT CG4 LACTIC ACID, ED    Imaging Review Dg Abd Acute W/chest  07/31/2015  CLINICAL DATA:  25 year old female with a history of abdominal pain, chronic renal failure with hemodialysis, caudal regression. EXAM: DG ABDOMEN ACUTE W/ 1V CHEST COMPARISON:  Multiple prior plain film, most recent 05/26/2013, 05/04/2013, prior CT abdomen 01/18/2012 FINDINGS:  Chest: Similar appearance of the configuration of the thoracic cage, with supernumerary right-sided ribs. Similar appearance of the cardiomediastinal silhouette, unchanged in size and contour. No evidence of pulmonary vascular congestion. No pneumothorax or pleural effusion. No confluent airspace disease. Do not appearance of the bilateral hemidiaphragm again, similar prior. Abdomen: Formed stool within the rectum, with more proximal sigmoid colon and left colonic gas. No abnormally distended small bowel or colon. Paucity of small bowel gas. Skeletal manifestations of caudal regression, with narrowing of the pelvic outlet, bilateral acetabular protrusio. IMPRESSION: Chest: No radiographic evidence of acute cardiopulmonary disease. Abdomen: Formed stool within the sigmoid colon rectum, suggesting constipation, with no evidence to suggest bowel obstruction. Musculoskeletal manifestation of caudal regression. Signed, Dulcy Fanny. Earleen Newport, DO [facility]  Electronically Signed   By: Corrie Mckusick D.O.   On: 07/31/2015 13:38   Ct Renal Stone Study  07/31/2015  CLINICAL DATA:  Severe abdominal pain.  Back pain. EXAM: CT ABDOMEN AND PELVIS WITHOUT CONTRAST TECHNIQUE: Multidetector CT imaging of the abdomen and pelvis was performed following the standard protocol without IV contrast. COMPARISON:  01/18/2012 FINDINGS: Lower chest: No pleural or pericardial effusion. The lung bases are clear. Hepatobiliary: No suspicious liver abnormality identified. Cyst is noted within the right hepatic lobe measuring 11 mm. Gallbladder is normal. No biliary dilatation. Pancreas: Unremarkable. Spleen: The spleen is negative. Adrenals/Urinary Tract: The adrenal glands are poorly visualized. 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 Chronic bilateral hydroureter and Moderate distension of the urinary bladder. Numerous small bladder diverticula are present. Stomach/Bowel: Moderate distension of the stomach. The small bowel loops have a normal caliber. No pathologic dilatation of the proximal colon. There is marked distension of the rectum which is filled with desiccated stool. Large stool ball measures 8.1 x 8.4 x 8.8 cm. Vascular/Lymphatic: Within the limitations of noncontrast technique the vascular structures of the abdomen and pelvis appear grossly unremarkable. No adenopathy identified. Reproductive: New fat containing cystic structure within the left lower quadrant of the abdomen measures 5.2 x 4.8 x 5.0 cm and is concerning for and ovarian dermoid. Other: No free fluid or fluid collections identified within the abdomen or pelvis. Musculoskeletal: No aggressive lytic or sclerotic bone lesions. Chronic congenital osseous abnormalities are again noted compatible with spina bifida. IMPRESSION: 1. No acute findings identified within the abdomen or pelvis. 2. 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 3.  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 4. Large desiccated stool ball present within the rectum compatible with impaction. 5. New fat containing cystic structure in the left adnexa compatible with ovarian dermoid. Electronically Signed   By: Kerby Moors M.D.   On: 07/31/2015 14:32   I have personally reviewed and evaluated these images and lab results as part of my medical decision-making.   MDM   Final diagnoses:  Flank pain  Pyelonephritis  ESRD (end stage renal disease) ([facility])  Constipation, unspecified constipation type   Dialysis patient with spina bifida presenting with lower abdominal pain, back pain. since yesterday. Did not receive dialysis today. Self caths at baseline. She does make urine.  X-ray shows constipation without obstruction. Urinalysis is positive for infection.  No evidence of hyperkalemia or severe acidosis. She states she is below her dry weight.  No fecal impaction on exam. Treat for pyelonephritis with history of complicated UTI in dialysis patient with self-catheterization.  Suspect her abdominal and  back pain are likely combination of constipation and pyelonephritis. Vitals remained stable in the ED.  Admission d/w Dr. Jerilee Hoh.   I personally performed the services described in this documentation, which was scribed in my presence. The recorded information has been reviewed and is accurate.   Ezequiel Essex, MD 07/31/15 2103

## 2015-07-31 NOTE — H&P (Signed)
[facility]          History and Physical    PCP:   No PCP Per Patient   EDP: Ezequiel Essex, M.D.  Chief Complaint:  Abdominal pain and back pain  HPI: Patient is a pleasant 25 year old young lady with spina bifida, end-stage renal disease on hemodialysis, hypertension and depression who presents to the hospital with complaints of abdominal pain and suprapubic pain for the last 2 days. She denies any fevers or chills, no nausea or vomiting or diarrhea. She has a neurogenic bladder and self catheterizes. In the ED she was found to have a UA that may be indicative of a UTI with 2 numerous to count WBCs and many bacteria, however negative nitrate. She had a CT scan that showed no acute findings, large stool ball within the rectum compatible with impaction. Labs are consistent with her end-stage renal disease, however nothing acute. She has had severe abdominal and back pain in the ED despite treatment and admission was requested. Of note she was scheduled to be dialyzed today, however patient is presently refusing dialysis. She has been evaluated by nephrology. We are asked to admit her for further evaluation and management.  Allergies:   Allergies  Allergen Reactions  . Ciprofloxacin Shortness Of Breath, Nausea And Vomiting and Other (See Comments)    HIGH FEVER  . Other Anaphylaxis    Revaclear dialzer  . Peanut-Containing Drug Products Anaphylaxis  . Aleve [Naproxen Sodium] Other (See Comments)    G.I.Bleed  . Influenza Vaccines Nausea And Vomiting    High fever  . Tetanus Toxoids Nausea And Vomiting and Other (See Comments)    HIGH FEVER  . Latex Itching and Rash      Past Medical History  Diagnosis Date  . Spina bifida   . UTI (lower urinary tract infection)   . HTN (hypertension) 05/02/2011  . Anemia associated with chronic renal failure   . 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  . Blood transfusion   . Dialysis care   . ESRD  (end stage renal disease) on dialysis ([place])   . Depression 05/14/2015    Past Surgical History  Procedure Laterality Date  . Av fistula placement      left arm    Prior to Admission medications   Medication Sig Start Date End Date Taking? Authorizing Provider  cinacalcet (SENSIPAR) 30 MG tablet Take 30 mg by mouth daily.   Yes Historical Provider, MD  Epoetin Alfa (EPOGEN IJ) Inject as directed. Pt gets on dialysis days which are Tuesday, Thursday, and Saturday.   Yes Historical Provider, MD  multivitamin (RENA-VIT) TABS tablet Take 1 tablet by mouth daily.   Yes Historical Provider, MD  sertraline (ZOLOFT) 25 MG tablet Take 1 tablet (25 mg total) by mouth daily. 05/18/15  Yes Hildred Priest, MD  sevelamer carbonate (RENVELA) 800 MG tablet Take 800 mg by mouth 3 (three) times daily with meals.    Yes Historical Provider, MD  traZODone (DESYREL) 150 MG tablet Take 1 tablet (150 mg total) by mouth at bedtime as needed for sleep. 05/18/15  Yes Hildred Priest, MD  Vitamin D, Ergocalciferol, (DRISDOL) 50000 UNITS CAPS capsule Take 50,000 Units by mouth every 7 (seven) days. Takes on Monday.   Yes Historical Provider, MD    Social History:  reports that she has never smoked. She does not have any smokeless tobacco  history on file. She reports that she does not drink alcohol or use illicit drugs.  Family History  Problem Relation Age of Onset  . Kidney cancer Other     Review of Systems:  Constitutional: Denies fever, chills, diaphoresis, appetite change and fatigue.  HEENT: Denies photophobia, eye pain, redness, hearing loss, ear pain, congestion, sore throat, rhinorrhea, sneezing, mouth sores, trouble swallowing, neck pain, neck stiffness and tinnitus.   Respiratory: Denies SOB, DOE, cough, chest tightness,  and wheezing.   Cardiovascular: Denies chest pain, palpitations and leg swelling.  Gastrointestinal: Denies nausea, vomiting, , diarrhea,  blood in stool and  abdominal distention.  Genitourinary: Denies dysuria, urgency, frequency, hematuria, flank pain and difficulty urinating.  Endocrine: Denies: hot or cold intolerance, sweats, changes in hair or nails, polyuria, polydipsia. Musculoskeletal: Denies myalgias, back pain, joint swelling, arthralgias and gait problem.  Skin: Denies pallor, rash and wound.  Neurological: Denies dizziness, seizures, syncope, weakness, light-headedness, numbness and headaches.  Hematological: Denies adenopathy. Easy bruising, personal or family bleeding history  Psychiatric/Behavioral: Denies suicidal ideation, mood changes, confusion, nervousness, sleep disturbance and agitation   Physical Exam: Blood pressure 118/71, pulse 105, temperature 98.6 F (37 C), temperature source Oral, resp. rate 22, weight 27.216 kg (60 lb), SpO2 97 %. General: Alert, awake, in significant distress secondary to abdominal and back pain. HEENT: Normocephalic, atraumatic, pupils equal round and reactive to light, moist mucous membranes Neck: Supple, no JVD, no lymphadenopathy, no bruits, no goiter. Cardiovascular: Regular rate and rhythm, no murmurs, rubs or gallops. Lungs: Clear to auscultation bilaterally.  Abdomen: Soft, tender to palpation to the suprapubic area, no CVA tenderness, positive bowel sounds Extremities: Atrophic and contracted lower extremities, no edema Neurologic: Upper extremities nonfocal.  Labs on Admission:  Results for orders placed or performed during the hospital encounter of 07/31/15 (from the past 48 hour(s))  CBC with Differential/Platelet     Status: Abnormal   Collection Time: 07/31/15 12:05 PM  Result Value Ref Range   WBC 9.7 4.0 - 10.5 K/uL   RBC 4.06 3.87 - 5.11 MIL/uL   Hemoglobin 12.2 12.0 - 15.0 g/dL   HCT 38.4 36.0 - 46.0 %   MCV 94.6 78.0 - 100.0 fL   MCH 30.0 26.0 - 34.0 pg   MCHC 31.8 30.0 - 36.0 g/dL   RDW 15.7 (H) 11.5 - 15.5 %   Platelets 210 150 - 400 K/uL   Neutrophils Relative % 79  %   Neutro Abs 7.7 1.7 - 7.7 K/uL   Lymphocytes Relative 13 %   Lymphs Abs 1.2 0.7 - 4.0 K/uL   Monocytes Relative 7 %   Monocytes Absolute 0.7 0.1 - 1.0 K/uL   Eosinophils Relative 1 %   Eosinophils Absolute 0.1 0.0 - 0.7 K/uL   Basophils Relative 0 %   Basophils Absolute 0.0 0.0 - 0.1 K/uL  Comprehensive metabolic panel     Status: Abnormal   Collection Time: 07/31/15 12:05 PM  Result Value Ref Range   Sodium 142 135 - 145 mmol/L   Potassium 4.9 3.5 - 5.1 mmol/L   Chloride 107 101 - 111 mmol/L   CO2 24 22 - 32 mmol/L   Glucose, Bld 97 65 - 99 mg/dL   BUN 59 (H) 6 - 20 mg/dL   Creatinine, Ser 6.45 (H) 0.44 - 1.00 mg/dL   Calcium 9.5 8.9 - 10.3 mg/dL   Total Protein 7.8 6.5 - 8.1 g/dL   Albumin 4.2 3.5 - 5.0 g/dL   AST 18  15 - 41 U/L   ALT 12 (L) 14 - 54 U/L   Alkaline Phosphatase 142 (H) 38 - 126 U/L   Total Bilirubin 0.5 0.3 - 1.2 mg/dL   GFR calc non Af Amer 8 (L) >60 mL/min   GFR calc Af Amer 10 (L) >60 mL/min    Comment: (NOTE) The eGFR has been calculated using the CKD EPI equation. This calculation has not been validated in all clinical situations. eGFR's persistently <60 mL/min signify possible Chronic Kidney Disease.    Anion gap 11 5 - 15  Lipase, blood     Status: None   Collection Time: 07/31/15 12:05 PM  Result Value Ref Range   Lipase 30 11 - 51 U/L  I-Stat Beta hCG blood, ED ([facility], [facility], [facility] only)     Status: None   Collection Time: 07/31/15 12:34 PM  Result Value Ref Range   I-stat hCG, quantitative <5.0 <5 mIU/mL   Comment 3            Comment:   GEST. AGE      CONC.  (mIU/mL)   <=1 WEEK        5 - 50     2 WEEKS       50 - 500     3 WEEKS       100 - 10,000     4 WEEKS     1,000 - 30,000        FEMALE AND NON-PREGNANT FEMALE:     LESS THAN 5 mIU/mL   I-Stat CG4 Lactic Acid, ED     Status: None   Collection Time: 07/31/15 12:36 PM  Result Value Ref Range   Lactic Acid, Venous 1.52 0.5 - 2.0 mmol/L  Urinalysis, Routine w reflex microscopic (not at  [facility])     Status: Abnormal   Collection Time: 07/31/15  3:00 PM  Result Value Ref Range   Color, Urine YELLOW YELLOW   APPearance CLEAR CLEAR   Specific Gravity, Urine 1.015 1.005 - 1.030   pH 8.0 5.0 - 8.0   Glucose, UA 100 (A) NEGATIVE mg/dL   Hgb urine dipstick SMALL (A) NEGATIVE   Bilirubin Urine NEGATIVE NEGATIVE   Ketones, ur NEGATIVE NEGATIVE mg/dL   Protein, ur 100 (A) NEGATIVE mg/dL   Nitrite NEGATIVE NEGATIVE   Leukocytes, UA LARGE (A) NEGATIVE  Urine microscopic-add on     Status: Abnormal   Collection Time: 07/31/15  3:00 PM  Result Value Ref Range   Squamous Epithelial / LPF 0-5 (A) NONE SEEN   WBC, UA TOO NUMEROUS TO COUNT 0 - 5 WBC/hpf   RBC / HPF 0-5 0 - 5 RBC/hpf   Bacteria, UA MANY (A) NONE SEEN    Radiological Exams on Admission: Dg Abd Acute W/chest  07/31/2015  CLINICAL DATA:  25 year old female with a history of abdominal pain, chronic renal failure with hemodialysis, caudal regression. EXAM: DG ABDOMEN ACUTE W/ 1V CHEST COMPARISON:  Multiple prior plain film, most recent 05/26/2013, 05/04/2013, prior CT abdomen 01/18/2012 FINDINGS: Chest: Similar appearance of the configuration of the thoracic cage, with supernumerary right-sided ribs. Similar appearance of the cardiomediastinal silhouette, unchanged in size and contour. No evidence of pulmonary vascular congestion. No pneumothorax or pleural effusion. No confluent airspace disease. Do not appearance of the bilateral hemidiaphragm again, similar prior. Abdomen: Formed stool within the rectum, with more proximal sigmoid colon and left colonic gas. No abnormally distended small bowel or colon. Paucity of small bowel gas. Skeletal  manifestations of caudal regression, with narrowing of the pelvic outlet, bilateral acetabular protrusio. IMPRESSION: Chest: No radiographic evidence of acute cardiopulmonary disease. Abdomen: Formed stool within the sigmoid colon rectum, suggesting constipation, with no evidence to suggest bowel  obstruction. Musculoskeletal manifestation of caudal regression. Signed, Dulcy Fanny. Earleen Newport, DO [facility] Electronically Signed   By: Corrie Mckusick D.O.   On: 07/31/2015 13:38   Ct Renal Stone Study  07/31/2015  CLINICAL DATA:  Severe abdominal pain.  Back pain. EXAM: CT ABDOMEN AND PELVIS WITHOUT CONTRAST TECHNIQUE: Multidetector CT imaging of the abdomen and pelvis was performed following the standard protocol without IV contrast. COMPARISON:  01/18/2012 FINDINGS: Lower chest: No pleural or pericardial effusion. The lung bases are clear. Hepatobiliary: No suspicious liver abnormality identified. Cyst is noted within the right hepatic lobe measuring 11 mm. Gallbladder is normal. No biliary dilatation. Pancreas: Unremarkable. Spleen: The spleen is negative. Adrenals/Urinary Tract: The adrenal glands are poorly visualized. 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 Chronic bilateral hydroureter and Moderate distension of the urinary bladder. Numerous small bladder diverticula are present. Stomach/Bowel: Moderate distension of the stomach. The small bowel loops have a normal caliber. No pathologic dilatation of the proximal colon. There is marked distension of the rectum which is filled with desiccated stool. Large stool ball measures 8.1 x 8.4 x 8.8 cm. Vascular/Lymphatic: Within the limitations of noncontrast technique the vascular structures of the abdomen and pelvis appear grossly unremarkable. No adenopathy identified. Reproductive: New fat containing cystic structure within the left lower quadrant of the abdomen measures 5.2 x 4.8 x 5.0 cm and is concerning for and ovarian dermoid. Other: No free fluid or fluid collections identified within the abdomen or pelvis. Musculoskeletal: No aggressive lytic or sclerotic bone lesions. Chronic congenital osseous abnormalities are again noted compatible with spina  bifida. IMPRESSION: 1. No acute findings identified within the abdomen or pelvis. 2. 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 3. 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 4. Large desiccated stool ball present within the rectum compatible with impaction. 5. New fat containing cystic structure in the left adnexa compatible with ovarian dermoid. Electronically Signed   By: Kerby Moors M.D.   On: 07/31/2015 14:32    Assessment/Plan Active Problems:   Back pain   ESRD (end stage renal disease) on dialysis ([place])   Spina bifida ([place])   Pyelonephritis   Abdominal pain   Constipation   Back pain/abdominal pain -Etiology unclear but could certainly be related to UTI and constipation. -Treat symptomatically with pain medications.  Probable UTI -Continue Rocephin pending culture data.  Constipation -Increase bowel regimen, especially since she will be on aquatics.  End-stage renal disease on hemodialysis Tuesday Thursday Saturday. -She did not receive dialysis treatment today. Has been seen by Dr. Hinda Lenis, however refuses to be dialyzed at present. Next an-she has no indications for acute dialysis.  DVT prophylaxis -Lovenox  CODE STATUS -Full code  Time Spent on Admission: 85 minutes  HERNANDEZ ACOSTA,ESTELA [facility] Pager: [phone] 07/31/2015, 5:45 PM

## 2015-07-31 NOTE — ED Notes (Signed)
Pt c/o severe abd pain, writhing on bed d/t pain, unable to assess abd at this time.

## 2015-07-31 NOTE — Consult Note (Signed)
Reason for Consult: End-stage renal disease Referring Physician: Dr. Donneta Romberg is an 25 y.o. female.  HPI: She is a patient who has history of hypertension, spina bifida, end-stage renal disease on maintenance hemodialysis presently came with complaints of abdominal pain and also flank pain for the last 2 days. Patient denies any fevers chills or sweating. She denies any nausea or vomiting. Patient also does not have any diarrhea. Pain is all over and around her abdomen and back. When she was evaluated she was found to have pyelonephritis since admitted to the hospital. Consult is called [place] today is her dialysis day.  Past Medical History  Diagnosis Date  . Spina bifida   . UTI (lower urinary tract infection)   . HTN (hypertension) 05/02/2011  . Anemia associated with chronic renal failure   . 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  . Blood transfusion   . Dialysis care   . ESRD (end stage renal disease) on dialysis ([place])   . Depression 05/14/2015    Past Surgical History  Procedure Laterality Date  . Av fistula placement      left arm    Family History  Problem Relation Age of Onset  . Kidney cancer Other     Social History:  reports that she has never smoked. She does not have any smokeless tobacco history on file. She reports that she does not drink alcohol or use illicit drugs.  Allergies:  Allergies  Allergen Reactions  . Ciprofloxacin Shortness Of Breath, Nausea And Vomiting and Other (See Comments)    HIGH FEVER  . Other Anaphylaxis    Revaclear dialzer  . Peanut-Containing Drug Products Anaphylaxis  . Aleve [Naproxen Sodium] Other (See Comments)    G.I.Bleed  . Influenza Vaccines Nausea And Vomiting    High fever  . Tetanus Toxoids Nausea And Vomiting and Other (See Comments)    HIGH FEVER  . Latex Itching and Rash    Medications: I have reviewed the patient's current medications.  Results for orders placed or performed  during the hospital encounter of 07/31/15 (from the past 48 hour(s))  CBC with Differential/Platelet     Status: Abnormal   Collection Time: 07/31/15 12:05 PM  Result Value Ref Range   WBC 9.7 4.0 - 10.5 K/uL   RBC 4.06 3.87 - 5.11 MIL/uL   Hemoglobin 12.2 12.0 - 15.0 g/dL   HCT 38.4 36.0 - 46.0 %   MCV 94.6 78.0 - 100.0 fL   MCH 30.0 26.0 - 34.0 pg   MCHC 31.8 30.0 - 36.0 g/dL   RDW 15.7 (H) 11.5 - 15.5 %   Platelets 210 150 - 400 K/uL   Neutrophils Relative % 79 %   Neutro Abs 7.7 1.7 - 7.7 K/uL   Lymphocytes Relative 13 %   Lymphs Abs 1.2 0.7 - 4.0 K/uL   Monocytes Relative 7 %   Monocytes Absolute 0.7 0.1 - 1.0 K/uL   Eosinophils Relative 1 %   Eosinophils Absolute 0.1 0.0 - 0.7 K/uL   Basophils Relative 0 %   Basophils Absolute 0.0 0.0 - 0.1 K/uL  Comprehensive metabolic panel     Status: Abnormal   Collection Time: 07/31/15 12:05 PM  Result Value Ref Range   Sodium 142 135 - 145 mmol/L   Potassium 4.9 3.5 - 5.1 mmol/L   Chloride 107 101 - 111 mmol/L   CO2 24 22 - 32 mmol/L   Glucose, Bld 97 65 -  99 mg/dL   BUN 59 (H) 6 - 20 mg/dL   Creatinine, Ser 6.45 (H) 0.44 - 1.00 mg/dL   Calcium 9.5 8.9 - 10.3 mg/dL   Total Protein 7.8 6.5 - 8.1 g/dL   Albumin 4.2 3.5 - 5.0 g/dL   AST 18 15 - 41 U/L   ALT 12 (L) 14 - 54 U/L   Alkaline Phosphatase 142 (H) 38 - 126 U/L   Total Bilirubin 0.5 0.3 - 1.2 mg/dL   GFR calc non Af Amer 8 (L) >60 mL/min   GFR calc Af Amer 10 (L) >60 mL/min    Comment: (NOTE) The eGFR has been calculated using the CKD EPI equation. This calculation has not been validated in all clinical situations. eGFR's persistently <60 mL/min signify possible Chronic Kidney Disease.    Anion gap 11 5 - 15  Lipase, blood     Status: None   Collection Time: 07/31/15 12:05 PM  Result Value Ref Range   Lipase 30 11 - 51 U/L  I-Stat Beta hCG blood, ED ([facility], [facility], [facility] only)     Status: None   Collection Time: 07/31/15 12:34 PM  Result Value Ref Range   I-stat hCG,  quantitative <5.0 <5 mIU/mL   Comment 3            Comment:   GEST. AGE      CONC.  (mIU/mL)   <=1 WEEK        5 - 50     2 WEEKS       50 - 500     3 WEEKS       100 - 10,000     4 WEEKS     1,000 - 30,000        FEMALE AND NON-PREGNANT FEMALE:     LESS THAN 5 mIU/mL   I-Stat CG4 Lactic Acid, ED     Status: None   Collection Time: 07/31/15 12:36 PM  Result Value Ref Range   Lactic Acid, Venous 1.52 0.5 - 2.0 mmol/L  Urinalysis, Routine w reflex microscopic (not at [facility])     Status: Abnormal   Collection Time: 07/31/15  3:00 PM  Result Value Ref Range   Color, Urine YELLOW YELLOW   APPearance CLEAR CLEAR   Specific Gravity, Urine 1.015 1.005 - 1.030   pH 8.0 5.0 - 8.0   Glucose, UA 100 (A) NEGATIVE mg/dL   Hgb urine dipstick SMALL (A) NEGATIVE   Bilirubin Urine NEGATIVE NEGATIVE   Ketones, ur NEGATIVE NEGATIVE mg/dL   Protein, ur 100 (A) NEGATIVE mg/dL   Nitrite NEGATIVE NEGATIVE   Leukocytes, UA LARGE (A) NEGATIVE  Urine microscopic-add on     Status: Abnormal   Collection Time: 07/31/15  3:00 PM  Result Value Ref Range   Squamous Epithelial / LPF 0-5 (A) NONE SEEN   WBC, UA TOO NUMEROUS TO COUNT 0 - 5 WBC/hpf   RBC / HPF 0-5 0 - 5 RBC/hpf   Bacteria, UA MANY (A) NONE SEEN    Dg Abd Acute W/chest  07/31/2015  CLINICAL DATA:  25 year old female with a history of abdominal pain, chronic renal failure with hemodialysis, caudal regression. EXAM: DG ABDOMEN ACUTE W/ 1V CHEST COMPARISON:  Multiple prior plain film, most recent 05/26/2013, 05/04/2013, prior CT abdomen 01/18/2012 FINDINGS: Chest: Similar appearance of the configuration of the thoracic cage, with supernumerary right-sided ribs. Similar appearance of the cardiomediastinal silhouette, unchanged in size and contour. No evidence of pulmonary vascular congestion. No  pneumothorax or pleural effusion. No confluent airspace disease. Do not appearance of the bilateral hemidiaphragm again, similar prior. Abdomen: Formed stool  within the rectum, with more proximal sigmoid colon and left colonic gas. No abnormally distended small bowel or colon. Paucity of small bowel gas. Skeletal manifestations of caudal regression, with narrowing of the pelvic outlet, bilateral acetabular protrusio. IMPRESSION: Chest: No radiographic evidence of acute cardiopulmonary disease. Abdomen: Formed stool within the sigmoid colon rectum, suggesting constipation, with no evidence to suggest bowel obstruction. Musculoskeletal manifestation of caudal regression. Signed, Dulcy Fanny. Earleen Newport, DO [facility] Electronically Signed   By: Corrie Mckusick D.O.   On: 07/31/2015 13:38   Ct Renal Stone Study  07/31/2015  CLINICAL DATA:  Severe abdominal pain.  Back pain. EXAM: CT ABDOMEN AND PELVIS WITHOUT CONTRAST TECHNIQUE: Multidetector CT imaging of the abdomen and pelvis was performed following the standard protocol without IV contrast. COMPARISON:  01/18/2012 FINDINGS: Lower chest: No pleural or pericardial effusion. The lung bases are clear. Hepatobiliary: No suspicious liver abnormality identified. Cyst is noted within the right hepatic lobe measuring 11 mm. Gallbladder is normal. No biliary dilatation. Pancreas: Unremarkable. Spleen: The spleen is negative. Adrenals/Urinary Tract: The adrenal glands are poorly visualized. 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 Chronic bilateral hydroureter and Moderate distension of the urinary bladder. Numerous small bladder diverticula are present. Stomach/Bowel: Moderate distension of the stomach. The small bowel loops have a normal caliber. No pathologic dilatation of the proximal colon. There is marked distension of the rectum which is filled with desiccated stool. Large stool ball measures 8.1 x 8.4 x 8.8 cm. Vascular/Lymphatic: Within the limitations of noncontrast technique the vascular structures of the abdomen and  pelvis appear grossly unremarkable. No adenopathy identified. Reproductive: New fat containing cystic structure within the left lower quadrant of the abdomen measures 5.2 x 4.8 x 5.0 cm and is concerning for and ovarian dermoid. Other: No free fluid or fluid collections identified within the abdomen or pelvis. Musculoskeletal: No aggressive lytic or sclerotic bone lesions. Chronic congenital osseous abnormalities are again noted compatible with spina bifida. IMPRESSION: 1. No acute findings identified within the abdomen or pelvis. 2. 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 3. 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 4. Large desiccated stool ball present within the rectum compatible with impaction. 5. New fat containing cystic structure in the left adnexa compatible with ovarian dermoid. Electronically Signed   By: Kerby Moors M.D.   On: 07/31/2015 14:32    Review of Systems  Constitutional: Negative for fever and chills.  Respiratory: Negative for shortness of breath.   Gastrointestinal: Positive for abdominal pain. Negative for nausea and vomiting.  Genitourinary: Positive for flank pain. Negative for urgency and frequency.   Blood pressure 139/84, pulse 85, temperature 98.6 F (37 C), temperature source Oral, resp. rate 22, weight 60 lb (27.216 kg), SpO2 100 %. Physical Exam  Constitutional: She is oriented to person, place, and time. No distress.  Eyes: No scleral icterus.  Cardiovascular: Regular rhythm.   Respiratory: She has no wheezes. She has no rales.  GI: There is no tenderness. There is no rebound.  Musculoskeletal: She exhibits no edema.  Neurological: She is alert and oriented to person, place, and time.    Assessment/Plan: Problem #1 end-stage renal disease: She is status post hemodialysis on Saturday. Patient is due for dialysis today. Presently she  denies any nausea vomiting. She  denies also any difficulty breathing. Patient stated that she is under her estimated dry weight hence she does want dialysis today particularly the hospital. She also denies any uremic signs and symptoms. Her potassium is normal. Problem #2 hypertension: Her blood pressure is reasonably controlled Problem #3 pyelonephritis: Patient presently is a febrile and her white blood cell count is normal. Problem #4 anemia: Her hemoglobin is above our target goal Problem# 5 history of depression Problem #6 history of spinal bifida Plan: Since today is her regular scheduled dialysis day I offered her to have dialysis today is a hospital. Patient presently states that she is below under her estimated dry weight hence she does want dialysis. I discussed with her about the importance of dialysis besides removing fluid. Patient states that she does want dialysis in the hospital and she will get it when she go back to her regular schedule as an outpatient. Hence at this moment she doesn't want considering dialysis as inpatient. Presently she is stable if she required dialysis tomorrow then we'll make arrangement has longer she is agreeable. We'll check her basic metabolic panel the morning.  Anberlin Diez S 07/31/2015, 4:59 PM

## 2015-07-31 NOTE — ED Notes (Signed)
Pt c/o intermittent abd pain since yesterday, worsening today. Pt sent from dialysis prior to treatment. N/v/d.

## 2015-07-31 NOTE — ED Notes (Signed)
Pt vomited x 1. 500cc brown.

## 2015-08-01 ENCOUNTER — Telehealth (HOSPITAL_COMMUNITY): Payer: Self-pay | Admitting: Physical Therapy

## 2015-08-01 ENCOUNTER — Ambulatory Visit (HOSPITAL_COMMUNITY): Payer: Medicare Other | Admitting: Occupational Therapy

## 2015-08-01 ENCOUNTER — Ambulatory Visit (HOSPITAL_COMMUNITY): Payer: Medicare Other | Admitting: Physical Therapy

## 2015-08-01 ENCOUNTER — Encounter (HOSPITAL_COMMUNITY): Payer: Self-pay | Admitting: Physical Therapy

## 2015-08-01 DIAGNOSIS — N186 End stage renal disease: Secondary | ICD-10-CM | POA: Diagnosis not present

## 2015-08-01 DIAGNOSIS — K5900 Constipation, unspecified: Secondary | ICD-10-CM | POA: Diagnosis not present

## 2015-08-01 DIAGNOSIS — R109 Unspecified abdominal pain: Secondary | ICD-10-CM | POA: Diagnosis not present

## 2015-08-01 DIAGNOSIS — E8889 Other specified metabolic disorders: Secondary | ICD-10-CM | POA: Diagnosis not present

## 2015-08-01 DIAGNOSIS — N12 Tubulo-interstitial nephritis, not specified as acute or chronic: Principal | ICD-10-CM

## 2015-08-01 DIAGNOSIS — I120 Hypertensive chronic kidney disease with stage 5 chronic kidney disease or end stage renal disease: Secondary | ICD-10-CM | POA: Diagnosis not present

## 2015-08-01 DIAGNOSIS — I10 Essential (primary) hypertension: Secondary | ICD-10-CM | POA: Diagnosis not present

## 2015-08-01 DIAGNOSIS — Z992 Dependence on renal dialysis: Secondary | ICD-10-CM

## 2015-08-01 DIAGNOSIS — F329 Major depressive disorder, single episode, unspecified: Secondary | ICD-10-CM | POA: Diagnosis not present

## 2015-08-01 DIAGNOSIS — Q059 Spina bifida, unspecified: Secondary | ICD-10-CM | POA: Diagnosis not present

## 2015-08-01 DIAGNOSIS — D631 Anemia in chronic kidney disease: Secondary | ICD-10-CM | POA: Diagnosis not present

## 2015-08-01 LAB — CBC
HCT: 37.2 % (ref 36.0–46.0)
HEMATOCRIT: 37.2 % (ref 36.0–46.0)
HEMOGLOBIN: 11.6 g/dL — AB (ref 12.0–15.0)
Hemoglobin: 12 g/dL (ref 12.0–15.0)
MCH: 29.5 pg (ref 26.0–34.0)
MCH: 29.9 pg (ref 26.0–34.0)
MCHC: 31.2 g/dL (ref 30.0–36.0)
MCHC: 32.3 g/dL (ref 30.0–36.0)
MCV: 94.7 fL (ref 78.0–100.0)
MCV: 92.5 fL (ref 78.0–100.0)
Platelets: 196 10*3/uL (ref 150–400)
Platelets: 192 10*3/uL (ref 150–400)
RBC: 3.93 MIL/uL (ref 3.87–5.11)
RBC: 4.02 MIL/uL (ref 3.87–5.11)
RDW: 16.1 % — AB (ref 11.5–15.5)
RDW: 15.9 % — ABNORMAL HIGH (ref 11.5–15.5)
WBC: 6.2 10*3/uL (ref 4.0–10.5)
WBC: 6.1 10*3/uL (ref 4.0–10.5)

## 2015-08-01 LAB — BASIC METABOLIC PANEL
ANION GAP: 12 (ref 5–15)
BUN: 65 mg/dL — AB (ref 6–20)
CHLORIDE: 106 mmol/L (ref 101–111)
CO2: 23 mmol/L (ref 22–32)
Calcium: 9.5 mg/dL (ref 8.9–10.3)
Creatinine, Ser: 7.15 mg/dL — ABNORMAL HIGH (ref 0.44–1.00)
GFR calc Af Amer: 9 mL/min — ABNORMAL LOW (ref >60)
GFR, EST NON AFRICAN AMERICAN: 7 mL/min — AB (ref >60)
GLUCOSE: 65 mg/dL (ref 65–99)
POTASSIUM: 5.1 mmol/L (ref 3.5–5.1)
Sodium: 141 mmol/L (ref 135–145)

## 2015-08-01 MED ORDER — SODIUM CHLORIDE 0.9 % IV SOLN: 100.0000 mL | INTRAVENOUS | Status: DC | PRN

## 2015-08-01 MED ORDER — PENTAFLUOROPROP-TETRAFLUOROETH EX AERO: 1.0000 "application " | INHALATION_SPRAY | CUTANEOUS | Status: DC | PRN

## 2015-08-01 MED ORDER — HEPARIN SODIUM (PORCINE) 1000 UNIT/ML IJ SOLN
INTRAMUSCULAR | Status: AC
  Administered 2015-08-01: 600 [IU] via INTRAVENOUS_CENTRAL
  Filled 2015-08-01: qty 2

## 2015-08-01 MED ORDER — LIDOCAINE-PRILOCAINE 2.5-2.5 % EX CREA: 1.0000 "application " | TOPICAL_CREAM | CUTANEOUS | Status: DC | PRN

## 2015-08-01 MED ORDER — HEPARIN SODIUM (PORCINE) 1000 UNIT/ML DIALYSIS
20.0000 [IU]/kg | INTRAMUSCULAR | Status: DC | PRN
  Administered 2015-08-01: 600 [IU] via INTRAVENOUS_CENTRAL

## 2015-08-01 MED ORDER — LIDOCAINE HCL (PF) 1 % IJ SOLN: 5.0000 mL | INTRAMUSCULAR | Status: DC | PRN

## 2015-08-01 NOTE — Telephone Encounter (Signed)
Pt did not show for appointment.  When reviewed chart, discovered she has been admitted into hospital. Teena Irani, PTA/CLT [phone]

## 2015-08-01 NOTE — Progress Notes (Signed)
[name] [id number] DOB: 1991/03/11 DOA: 07/31/2015 PCP: No PCP Per Patient  Brief narrative: 23 ? History of spina bifida Congenital horseshoe kidney ESRD Tuesday Thursday Saturday since age? [place] Frequent self catheterizations secondary to neurogenic bladder  Admitted with presumed pyelonephritis  Past medical history-As per Problem list Chart reviewed as below-    Consults Nephrology    Antibiotic Rocephin 07/31/15   Subjective   Doing fair. Negotiating for ad lib. fluids  Tolerating by mouth   no nausea no vomiting currently No chest pain   Objective    Interim History:   Telemetry:    Objective: Filed Vitals:   07/31/15 2130 08/01/15 0650 08/01/15 1454 08/01/15 1545  BP: 134/71 111/65 127/66 119/55  Pulse: 80  82 8  Temp: 97.8 F (36.6 C) 98.5 F (36.9 C) 98.4 F (36.9 C) 99.1 F (37.3 C)  TempSrc: Oral Oral Oral Oral  Resp: 20 20 20 16   Height:      Weight:    27.7 kg (61 lb 1.1 oz)  SpO2:  100% 100% 100%    Intake/Output Summary (Last 24 hours) at 08/01/15 1645 Last data filed at 08/01/15 1230  Gross per 24 hour  Intake    480 ml  Output    400 ml  Net     80 ml    Exam:  GenerEOMI NCAT CardiS1-S2 no murmur rub or gallop Respiratory:  clinically clear no added sound Abdomen:  soft nontender nondistended but poor exam Skin no lower extremity edema  Neuro symmetric severe lower extremity limb hypoplasia and contractures without ulcer or any other issue   Data Reviewed: Basic Metabolic Panel:  Recent Labs Lab 07/31/15 1205 08/01/15 0541  NA 142 141  K 4.9 5.1  CL 107 106  CO2 24 23  GLUCOSE 97 65  BUN 59* 65*  CREATININE 6.45* 7.15*  CALCIUM 9.5 9.5   Liver Function Tests:  Recent Labs Lab 07/31/15 1205  AST 18  ALT 12*  ALKPHOS 142*  BILITOT 0.5  PROT 7.8  ALBUMIN 4.2    Recent Labs Lab 07/31/15 1205  LIPASE 30   No results for input(s): AMMONIA in the last 168  hours. CBC:  Recent Labs Lab 07/31/15 1205 08/01/15 0541 08/01/15 1614  WBC 9.7 6.2 6.1  NEUTROABS 7.7  --   --   HGB 12.2 11.6* 12.0  HCT 38.4 37.2 37.2  MCV 94.6 94.7 92.5  PLT 210 196 192   Cardiac Enzymes: No results for input(s): CKTOTAL, CKMB, CKMBINDEX, TROPONINI in the last 168 hours. BNP: Invalid input(s): POCBNP CBG: No results for input(s): GLUCAP in the last 168 hours.  Recent Results (from the past 240 hour(s))  Urine culture     Status: None (Preliminary result)   Collection Time: 07/31/15  2:59 PM  Result Value Ref Range Status   Specimen Description URINE, CATHETERIZED  Final   Special Requests NONE  Final   Culture   Final    TOO YOUNG TO READ Performed at [facility]    Report Status PENDING  Incomplete  MRSA PCR Screening     Status: None   Collection Time: 07/31/15  8:00 PM  Result Value Ref Range Status   MRSA by PCR NEGATIVE NEGATIVE Final    Comment:        The GeneXpert MRSA Assay (FDA approved for NASAL specimens only), is one component of a comprehensive MRSA colonization surveillance program. It is not intended to diagnose  MRSA infection nor to guide or monitor treatment for MRSA infections.      Studies:              All Imaging reviewed and is as per above notation   Scheduled Meds: . cefTRIAXone (ROCEPHIN)  IV  1 g Intravenous Q24H  . cinacalcet  30 mg Oral Q breakfast  . docusate sodium  100 mg Oral BID  . heparin      . heparin subcutaneous  5,000 Units Subcutaneous 3 times per day  . multivitamin  1 tablet Oral Daily  . senna  1 tablet Oral BID  . sertraline  25 mg Oral Daily  . sevelamer carbonate  800 mg Oral TID WC  . sodium chloride  3 mL Intravenous Q12H  . [START ON 08/06/2015] Vitamin D (Ergocalciferol)  50,000 Units Oral Q7 days   Continuous Infusions:    Assessment/Plan:  1. presumed pyelonephritis we will continue Rocephin IV. CBC plus difficulty a.m.. Follow urine culture  sensitivity. 2. End-stage renal disease Tuesday Thursday Saturday-fluid restriction discontinued. Appreciate nephrology input for dialysis today 08/02/15. 3. Depression-continue sertraline 25 daily 4. Metabolic bone disease-continue so now set 30 Q breakfast, Renvela 800 3 times a day meals. Start ergocalciferol 50,000 units every week 1/9    Appt with PCP: patient to arrange Code Status : presumed Full code Family Communo family currently present at bedside Disposition Plan:  likely will discharge home in 24 hours  DVT prophylaxis: SCD   Verneita Griffes, MD  [facility] Pager [phone] 08/01/2015, 4:45 PM    LOS: 1 day

## 2015-08-01 NOTE — Care Management Obs Status (Signed)
[place] NOTIFICATION   Patient Details  Name: KOLBEY CHRISTOFF MRN: [id number] Date of Birth: [birth year]   Medicare Observation Status Notification Given:  Yes    Sherald Barge, RN 08/01/2015, 3:32 PM

## 2015-08-01 NOTE — Procedures (Signed)
   HEMODIALYSIS TREATMENT NOTE:  3 hour low-heparin dialysis completed via left upper arm AVF (15g/antegrade). Goal NOT met: Pt refused ultrafiltration for 1.5 hours of tx, repeatedly requested NS boluses for cramping. Was tearful and withdrawn whenever UF function was enabled. Ultrafiltration was stopped and Dr. Theador Hawthorne was notified. Net UF 124cc.  All blood was returned and hemostasis was achieved within 10 minutes. Report called to Gershon Cull, RN.  Rockwell Alexandria, RN, CDN

## 2015-08-01 NOTE — Progress Notes (Signed)
[name]  MRN: [id number]  DOB/AGE: 1990/10/10 25 y.o.  Primary Care Physician:No PCP Per Patient  Admit date: 07/31/2015  Chief Complaint:  Chief Complaint  Patient presents with  . Abdominal Pain    S-Pt presented on  07/31/2015 with  Chief Complaint  Patient presents with  . Abdominal Pain  .    Pt main concerns is " Can I have dialysis today?"    Pt also asking for concern is If you can remove my fluid restriction.   Meds . cefTRIAXone (ROCEPHIN)  IV  1 g Intravenous Q24H  . cinacalcet  30 mg Oral Q breakfast  . docusate sodium  100 mg Oral BID  . heparin subcutaneous  5,000 Units Subcutaneous 3 times per day  . multivitamin  1 tablet Oral Daily  . senna  1 tablet Oral BID  . sertraline  25 mg Oral Daily  . sevelamer carbonate  800 mg Oral TID WC  . sodium chloride  3 mL Intravenous Q12H  . [START ON 08/06/2015] Vitamin D (Ergocalciferol)  50,000 Units Oral Q7 days      Physical Exam: Vital signs in last 24 hours: Temp:  [97.8 F (36.6 C)-98.6 F (37 C)] 98.5 F (36.9 C) (01/04 0650) Pulse Rate:  [61-109] 80 (01/03 2130) Resp:  [20-24] 20 (01/04 0650) BP: (111-139)/(65-90) 111/65 mmHg (01/04 0650) SpO2:  [96 %-100 %] 100 % (01/04 0650) Weight:  [60 lb (27.216 kg)-66 lb 3.2 oz (30.028 kg)] 66 lb 3.2 oz (30.028 kg) (01/03 1817) Weight change:  Last BM Date: 07/31/15  Intake/Output from previous day:       Physical Exam: General- pt is awake,alert, oriented to time place and person Resp- No acute REsp distress, CTA B/L NO Rhonchi CVS- S1S2 regular in rate and rhythm GIT- BS+, soft, NT, ND EXT- NO Cyanosis   Lab Results: CBC  Recent Labs  07/31/15 1205 08/01/15 0541  WBC 9.7 6.2  HGB 12.2 11.6*  HCT 38.4 37.2  PLT 210 196    BMET  Recent Labs  07/31/15 1205 08/01/15 0541  NA 142 141  K 4.9 5.1  CL 107 106  CO2 24 23  GLUCOSE 97 65  BUN 59* 65*  CREATININE 6.45* 7.15*  CALCIUM 9.5 9.5    MICRO Recent Results (from the  past 240 hour(s))  MRSA PCR Screening     Status: None   Collection Time: 07/31/15  8:00 PM  Result Value Ref Range Status   MRSA by PCR NEGATIVE NEGATIVE Final    Comment:        The GeneXpert MRSA Assay (FDA approved for NASAL specimens only), is one component of a comprehensive MRSA colonization surveillance program. It is not intended to diagnose MRSA infection nor to guide or monitor treatment for MRSA infections.       Lab Results  Component Value Date   CALCIUM 9.5 08/01/2015   PHOS 6.5* 05/14/2015       Impression: 1)Renal  ESRD on HD                Pt is on TTS schedule                Pt refused HD yesterday    2)HTN BP is stable  3)Anemia HGb at goal (9--11)  4)CKD Mineral-Bone Disorder Secondary hyperparathyroidism present     On cinacalcet Phosphorus not at goal.     Dietary and dialysis non adherence to tx    On binders  Calcium is  at goal.  5)ID-admitted with UTI Primary MD following  6)Electrolytes Normokalemic NOrmonatremic   7)Acid base Co2 at goal     Plan:  Will dialyze today. Will /d fl;uid restriction as no fluid overload    Leita Lindbloom S 08/01/2015, 9:36 AM

## 2015-08-01 NOTE — Care Management Note (Signed)
Case Management Note  Patient Details  Name: [name] MRN: [id number] Date of Birth: Nov 13, 1990  Subjective/Objective:                  Pt is from home, ind with ADL's. Pt is wheelchair bound. Pt has no HH services or aids, lives with her grandmother. Pt self caths mulitple times a day. Pt receives HD T/TH/sa. Pt is hopeful to DC later today after HD.   Action/Plan: No CM needs.   Expected Discharge Date:      08/01/2015            Expected Discharge Plan:  Home/Self Care  In-House Referral:  NA  Discharge planning Services  CM Consult  Post Acute Care Choice:  NA Choice offered to:  NA  DME Arranged:    DME Agency:     HH Arranged:    HH Agency:     Status of Service:  Completed, signed off  Medicare Important Message Given:    Date Medicare IM Given:    Medicare IM give by:    Date Additional Medicare IM Given:    Additional Medicare Important Message give by:     If discussed at [place] of Stay Meetings, dates discussed:    Additional Comments:  Sherald Barge, RN 08/01/2015, 3:32 PM

## 2015-08-02 ENCOUNTER — Encounter (HOSPITAL_COMMUNITY): Payer: Self-pay | Admitting: Physical Therapy

## 2015-08-02 DIAGNOSIS — I120 Hypertensive chronic kidney disease with stage 5 chronic kidney disease or end stage renal disease: Secondary | ICD-10-CM | POA: Diagnosis not present

## 2015-08-02 DIAGNOSIS — K5900 Constipation, unspecified: Secondary | ICD-10-CM | POA: Diagnosis not present

## 2015-08-02 DIAGNOSIS — E8889 Other specified metabolic disorders: Secondary | ICD-10-CM | POA: Diagnosis not present

## 2015-08-02 DIAGNOSIS — N12 Tubulo-interstitial nephritis, not specified as acute or chronic: Secondary | ICD-10-CM | POA: Diagnosis not present

## 2015-08-02 DIAGNOSIS — F329 Major depressive disorder, single episode, unspecified: Secondary | ICD-10-CM | POA: Diagnosis not present

## 2015-08-02 DIAGNOSIS — N186 End stage renal disease: Secondary | ICD-10-CM | POA: Diagnosis not present

## 2015-08-02 DIAGNOSIS — I10 Essential (primary) hypertension: Secondary | ICD-10-CM | POA: Diagnosis not present

## 2015-08-02 DIAGNOSIS — Z992 Dependence on renal dialysis: Secondary | ICD-10-CM | POA: Diagnosis not present

## 2015-08-02 DIAGNOSIS — Q059 Spina bifida, unspecified: Secondary | ICD-10-CM | POA: Diagnosis not present

## 2015-08-02 DIAGNOSIS — R109 Unspecified abdominal pain: Secondary | ICD-10-CM | POA: Diagnosis not present

## 2015-08-02 DIAGNOSIS — D631 Anemia in chronic kidney disease: Secondary | ICD-10-CM | POA: Diagnosis not present

## 2015-08-02 MED ORDER — DIPHENHYDRAMINE HCL 25 MG PO CAPS
25.0000 mg | ORAL_CAPSULE | Freq: Four times a day (QID) | ORAL | Status: DC | PRN
  Administered 2015-08-02: 25 mg via ORAL
  Filled 2015-08-02: qty 1

## 2015-08-02 NOTE — Progress Notes (Signed)
[name] [id number] DOB: March 14, 1991 DOA: 07/31/2015 PCP: No PCP Per Patient  Brief narrative: 23 ? History of spina bifida Congenital horseshoe kidney ESRD Tuesday Thursday Saturday since age? [place] Frequent self catheterizations secondary to neurogenic bladder  Admitted with presumed pyelonephritis  Past medical history-As per Problem list Chart reviewed as below-    Consults Nephrology    Antibiotic Rocephin 07/31/15   Subjective   Mild nausea Asking for benadryl No cp One episode vomit    Objective    Interim History:   Telemetry:    Objective: Filed Vitals:   08/01/15 1855 08/01/15 1905 08/01/15 2224 08/02/15 0645  BP: 114/69 127/67 114/56 115/61  Pulse: 72 78 100 89  Temp:  99 F (37.2 C) 98.7 F (37.1 C) 98.5 F (36.9 C)  TempSrc:  Oral Oral Oral  Resp:  16 20 20   Height:      Weight:      SpO2:  100% 100% 100%    Intake/Output Summary (Last 24 hours) at 08/02/15 1109 Last data filed at 08/02/15 1036  Gross per 24 hour  Intake    240 ml  Output    923 ml  Net   -683 ml    Exam:  GenerEOMI NCAT CardiS1-S2 no murmur rub or gallop Respiratory:  clinically clear no added sound Abdomen:  soft nontender nondistended but poor exam Skin no lower extremity edema  Neuro symmetric severe lower extremity limb hypoplasia and contractures without ulcer or any other issue   Data Reviewed: Basic Metabolic Panel:  Recent Labs Lab 07/31/15 1205 08/01/15 0541  NA 142 141  K 4.9 5.1  CL 107 106  CO2 24 23  GLUCOSE 97 65  BUN 59* 65*  CREATININE 6.45* 7.15*  CALCIUM 9.5 9.5   Liver Function Tests:  Recent Labs Lab 07/31/15 1205  AST 18  ALT 12*  ALKPHOS 142*  BILITOT 0.5  PROT 7.8  ALBUMIN 4.2    Recent Labs Lab 07/31/15 1205  LIPASE 30   No results for input(s): AMMONIA in the last 168 hours. CBC:  Recent Labs Lab 07/31/15 1205 08/01/15 0541 08/01/15 1614  WBC 9.7 6.2 6.1  NEUTROABS 7.7   --   --   HGB 12.2 11.6* 12.0  HCT 38.4 37.2 37.2  MCV 94.6 94.7 92.5  PLT 210 196 192   Cardiac Enzymes: No results for input(s): CKTOTAL, CKMB, CKMBINDEX, TROPONINI in the last 168 hours. BNP: Invalid input(s): POCBNP CBG: No results for input(s): GLUCAP in the last 168 hours.  Recent Results (from the past 240 hour(s))  Urine culture     Status: None (Preliminary result)   Collection Time: 07/31/15  2:59 PM  Result Value Ref Range Status   Specimen Description URINE, CATHETERIZED  Final   Special Requests NONE  Final   Culture   Final    TOO YOUNG TO READ Performed at [facility]    Report Status PENDING  Incomplete  MRSA PCR Screening     Status: None   Collection Time: 07/31/15  8:00 PM  Result Value Ref Range Status   MRSA by PCR NEGATIVE NEGATIVE Final    Comment:        The GeneXpert MRSA Assay (FDA approved for NASAL specimens only), is one component of a comprehensive MRSA colonization surveillance program. It is not intended to diagnose MRSA infection nor to guide or monitor treatment for MRSA infections.      Studies:  All Imaging reviewed and is as per above notation   Scheduled Meds: . cefTRIAXone (ROCEPHIN)  IV  1 g Intravenous Q24H  . cinacalcet  30 mg Oral Q breakfast  . docusate sodium  100 mg Oral BID  . heparin subcutaneous  5,000 Units Subcutaneous 3 times per day  . multivitamin  1 tablet Oral Daily  . senna  1 tablet Oral BID  . sertraline  25 mg Oral Daily  . sevelamer carbonate  800 mg Oral TID WC  . sodium chloride  3 mL Intravenous Q12H  . [START ON 08/06/2015] Vitamin D (Ergocalciferol)  50,000 Units Oral Q7 days   Continuous Infusions:    Assessment/Plan:  1. presumed pyelonephritis we will continue Rocephin IV. CBC plus difficulty a.m.. urine culture sensitivity pending still 2. End-stage renal disease Tuesday Thursday Saturday-fluid restriction discontinued. Appreciate nephrology input for dialysis as per  them 3. Depression-continue sertraline 25 daily 4. Metabolic bone disease-continue so now set 30 Q breakfast, Renvela 800 3 times a day meals. Start ergocalciferol 50,000 units every week 1/9    Appt with PCP: patient to arrange Code Status : presumed Full code Family Communo family currently present at bedside Disposition Plan:  likely will discharge home in 24 hours  DVT prophylaxis: SCD   Verneita Griffes, MD  [facility] Pager [phone] 08/02/2015, 11:09 AM    LOS: 2 days

## 2015-08-02 NOTE — Progress Notes (Signed)
Subjective: Patient feels much better. She didn't have anymore of abdominal pain. She has some nausea but no vomiting. She denies any urgency or frequency.   Objective: Vital signs in last 24 hours: Temp:  [98.4 F (36.9 C)-99.1 F (37.3 C)] 98.5 F (36.9 C) (01/05 0645) Pulse Rate:  [70-100] 89 (01/05 0645) Resp:  [16-20] 20 (01/05 0645) BP: (112-141)/(54-76) 115/61 mmHg (01/05 0645) SpO2:  [100 %] 100 % (01/05 0645) Weight:  [61 lb 1.1 oz (27.7 kg)] 61 lb 1.1 oz (27.7 kg) (01/04 1545)  Intake/Output from previous day: 01/04 0701 - 01/05 0700 In: 480 [P.O.:480] Out: 523 [Urine:400] Intake/Output this shift: Total I/O In: -  Out: 400 [Urine:400]   Recent Labs  07/31/15 1205 08/01/15 0541 08/01/15 1614  HGB 12.2 11.6* 12.0    Recent Labs  08/01/15 0541 08/01/15 1614  WBC 6.2 6.1  RBC 3.93 4.02  HCT 37.2 37.2  PLT 196 192    Recent Labs  07/31/15 1205 08/01/15 0541  NA 142 141  K 4.9 5.1  CL 107 106  CO2 24 23  BUN 59* 65*  CREATININE 6.45* 7.15*  GLUCOSE 97 65  CALCIUM 9.5 9.5   No results for input(s): LABPT, INR in the last 72 hours.  Generally patient is alert and in no apparent distress Chest is clear to auscultation Heart exam regular rate and rhythm no murmur Abdomen: Soft positive bowel sounds nontender Extremities no edema  Assessment/Plan: Problem #1 history of pyelonephritis. Presently she is a febrile with normal white blood cell count. Patient is also feeling much better. Problem #2 end-stage renal disease: She is status post hemodialysis yesterday. Potassium predialysis was 5.1 and presently she has some nausea but no vomiting. Patient says that her nausea is chronic and she had it on and off from before. Problem #3 anemia: Her hemoglobin is up about targeted cold. Problem #4 hypertension: Blood pressure is reasonably controlled Problem #5. Metabolic bone disease: Calcium is range. Problem # 6 history of depression Plan: Patient does  require dialysis today Her next dialysis will be tomorrow and if patient's going to be discharged should go to her regular unit for her next dialysis.    [name] S 08/02/2015, 10:57 AM

## 2015-08-03 ENCOUNTER — Telehealth (HOSPITAL_COMMUNITY): Payer: Self-pay

## 2015-08-03 ENCOUNTER — Ambulatory Visit (HOSPITAL_COMMUNITY): Payer: Medicare Other

## 2015-08-03 DIAGNOSIS — Z992 Dependence on renal dialysis: Secondary | ICD-10-CM | POA: Diagnosis not present

## 2015-08-03 DIAGNOSIS — Q059 Spina bifida, unspecified: Secondary | ICD-10-CM

## 2015-08-03 DIAGNOSIS — K5900 Constipation, unspecified: Secondary | ICD-10-CM | POA: Diagnosis not present

## 2015-08-03 DIAGNOSIS — R109 Unspecified abdominal pain: Secondary | ICD-10-CM | POA: Diagnosis not present

## 2015-08-03 DIAGNOSIS — N186 End stage renal disease: Secondary | ICD-10-CM | POA: Diagnosis not present

## 2015-08-03 DIAGNOSIS — N2581 Secondary hyperparathyroidism of renal origin: Secondary | ICD-10-CM | POA: Diagnosis not present

## 2015-08-03 DIAGNOSIS — E8889 Other specified metabolic disorders: Secondary | ICD-10-CM | POA: Diagnosis not present

## 2015-08-03 DIAGNOSIS — F329 Major depressive disorder, single episode, unspecified: Secondary | ICD-10-CM | POA: Diagnosis not present

## 2015-08-03 DIAGNOSIS — K5909 Other constipation: Secondary | ICD-10-CM | POA: Diagnosis not present

## 2015-08-03 DIAGNOSIS — I120 Hypertensive chronic kidney disease with stage 5 chronic kidney disease or end stage renal disease: Secondary | ICD-10-CM | POA: Diagnosis not present

## 2015-08-03 DIAGNOSIS — N12 Tubulo-interstitial nephritis, not specified as acute or chronic: Secondary | ICD-10-CM | POA: Diagnosis not present

## 2015-08-03 DIAGNOSIS — I10 Essential (primary) hypertension: Secondary | ICD-10-CM | POA: Diagnosis not present

## 2015-08-03 LAB — BASIC METABOLIC PANEL
ANION GAP: 14 (ref 5–15)
BUN: 38 mg/dL — ABNORMAL HIGH (ref 6–20)
CO2: 31 mmol/L (ref 22–32)
Calcium: 9.6 mg/dL (ref 8.9–10.3)
Chloride: 95 mmol/L — ABNORMAL LOW (ref 101–111)
Creatinine, Ser: 5.97 mg/dL — ABNORMAL HIGH (ref 0.44–1.00)
GFR calc Af Amer: 11 mL/min — ABNORMAL LOW (ref >60)
GFR calc non Af Amer: 9 mL/min — ABNORMAL LOW (ref >60)
GLUCOSE: 75 mg/dL (ref 65–99)
POTASSIUM: 3.9 mmol/L (ref 3.5–5.1)
Sodium: 140 mmol/L (ref 135–145)

## 2015-08-03 LAB — URINE CULTURE

## 2015-08-03 MED ORDER — NITROFURANTOIN MONOHYD MACRO 100 MG PO CAPS: 100.0000 mg | ORAL_CAPSULE | Freq: Once | ORAL | Status: DC

## 2015-08-03 MED ORDER — NITROFURANTOIN MONOHYD MACRO 100 MG PO CAPS: 100.0000 mg | ORAL_CAPSULE | Freq: Two times a day (BID) | ORAL | Status: DC

## 2015-08-03 MED ORDER — DIPHENHYDRAMINE HCL 25 MG PO CAPS: 25.0000 mg | ORAL_CAPSULE | Freq: Four times a day (QID) | ORAL | Status: DC | PRN

## 2015-08-03 MED ORDER — NITROFURANTOIN MACROCRYSTAL 50 MG PO CAPS
100.0000 mg | ORAL_CAPSULE | Freq: Once | ORAL | Status: DC
  Filled 2015-08-03: qty 2

## 2015-08-03 NOTE — Care Management Note (Signed)
Case Management Note  Patient Details  Name: [name] MRN: [id number] Date of Birth: 1991/06/08  Subjective/Objective:                    Action/Plan:   Expected Discharge Date:                  Expected Discharge Plan:  Home/Self Care  In-House Referral:  NA  Discharge planning Services  CM Consult  Post Acute Care Choice:  NA Choice offered to:  NA  DME Arranged:    DME Agency:     HH Arranged:    HH Agency:     Status of Service:  Completed, signed off  Medicare Important Message Given:    Date Medicare IM Given:    Medicare IM give by:    Date Additional Medicare IM Given:    Additional Medicare Important Message give by:     If discussed at [place] of Stay Meetings, dates discussed:    Additional Comments: Pt discharged home today after dialysis. No CM needs noted. Christinia Gully [place], RN 08/03/2015, 8:29 AM

## 2015-08-03 NOTE — Progress Notes (Addendum)
Patient alert and oriented, independent, VSS, pt. Tolerating diet well. No complaints of pain or nausea.Pt. Had prescriptions given. Pt. Voiced understanding of discharge instructions with no further questions. Pt. Will be discharged to dialysis center in [place] to receive dialysis 08/03/15.  Transportation provided by [facility] from [facility] to dialysis center.

## 2015-08-03 NOTE — Telephone Encounter (Signed)
She just got out of the hospital and can not come in today.

## 2015-08-03 NOTE — Progress Notes (Signed)
Subjective: Patient denies any nausea or vomiting. Her abdominal pain is progressively improving.  Objective: Vital signs in last 24 hours: Temp:  [98.2 F (36.8 C)-99.2 F (37.3 C)] 98.2 F (36.8 C) (01/06 0522) Pulse Rate:  [76-96] 76 (01/06 0522) Resp:  [20] 20 (01/06 0522) BP: (107-109)/(57-63) 107/63 mmHg (01/06 0522) SpO2:  [98 %-100 %] 98 % (01/06 0522)  Intake/Output from previous day: 01/05 0701 - 01/06 0700 In: 480 [P.O.:480] Out: 900 [Urine:900] Intake/Output this shift:     Recent Labs  07/31/15 1205 08/01/15 0541 08/01/15 1614  HGB 12.2 11.6* 12.0    Recent Labs  08/01/15 0541 08/01/15 1614  WBC 6.2 6.1  RBC 3.93 4.02  HCT 37.2 37.2  PLT 196 192    Recent Labs  08/01/15 0541 08/03/15 0533  NA 141 140  K 5.1 3.9  CL 106 95*  CO2 23 31  BUN 65* 38*  CREATININE 7.15* 5.97*  GLUCOSE 65 75  CALCIUM 9.5 9.6   No results for input(s): LABPT, INR in the last 72 hours.  Generally patient is alert and in no apparent distress Chest is clear to auscultation Heart exam regular rate and rhythm no murmur Abdomen: Soft and nontender. Extremities no edema  Assessment/Plan: Problem #1 history of pyelonephritis. Patient is asymptomatic and feeling much better. Problem #2 end-stage renal disease: She is status post hemodialysis on Wednesday. Her potassium is normal.. Problem #3 anemia: Her hemoglobin is up about target range and patient is not on Epogen. Problem #4 hypertension: Blood pressure is reasonably controlled Problem #5. Metabolic bone disease: Calcium is range. Problem # 6 history of depression Plan: We'll dialyze patient today. We'll use 3k/2.5 calcium bath. We'll remove 1 L if blood pressure tolerates.    Rayleigh Gillyard S 08/03/2015, 8:23 AM

## 2015-08-03 NOTE — Discharge Summary (Signed)
Physician Discharge Summary  [name] [id number] DOB: 1990-12-26 DOA: 07/31/2015  PCP: No PCP Per Patient  Admit date: 07/31/2015 Discharge date: 08/03/2015  Time spent: 20 minutes  Recommendations for Outpatient Follow-up:  1. Complete 1 more dose macrodantin as an outpatient--follow Urine culture as might need adjustment of Abx 2. Need chr pain evaluated as OP 3. Needs dialysis as OP per schedule  Discharge Diagnoses:  Active Problems:   ESRD (end stage renal disease) on dialysis ([place])   Spina bifida ([place])   Pyelonephritis   Back pain   Abdominal pain   Constipation   Discharge Condition: improved  Diet recommendation: no  Filed Weights   07/31/15 1159 07/31/15 1817 08/01/15 1545  Weight: 27.216 kg (60 lb) 30.028 kg (66 lb 3.2 oz) 27.7 kg (61 lb 1.1 oz)    History of present illness:  Brief narrative: 23 ? History of spina bifida Congenital horseshoe kidney ESRD Tuesday Thursday Saturday since age? [place] Frequent self catheterizations secondary to neurogenic bladder  Admitted with presumed pyelonephritis  Hospital Course:   1. presumed pyelonephritis was on initial continue Rocephin IV-->Macrodantin 100. CBC plus difficulty a.m.. urine culture sensitivity pending still 2. End-stage renal disease Tuesday Thursday Saturday-fluid restriction discontinued. Appreciate nephrology input for dialysism 3. Depression-continue sertraline 25 daily 4. Metabolic bone disease-continue so now set 30 Q breakfast, Renvela 800 3 times a day meals. Start ergocalciferol 50,000 units every week 1/9  Procedures:   Consultations:  Neprhology  Discharge Exam: Filed Vitals:   08/02/15 1912 08/03/15 0522  BP: 109/57 107/63  Pulse: 96 76  Temp: 99.2 F (37.3 C) 98.2 F (36.8 C)  Resp: 20 20    General:  eomi ncat Cardiovascular:  s1 s 2no m/r/g Respiratory: clear Deformity to back, feet stable no rash nor ulcerations  Discharge  Instructions   Discharge Instructions    Diet - low sodium heart healthy    Complete by:  As directed      Discharge instructions    Complete by:  As directed   Complete i more  days of Macrobid on 08/05/15 then STOP This is not a sulpha drug I do not know what your urine test will show-please contact your Primary MD if you have symptoms or signs of infection still and you may call our floor [phone] to get the results of your urine culture tomorrow morning     Increase activity slowly    Complete by:  As directed           Current Discharge Medication List    START taking these medications   Details  diphenhydrAMINE (BENADRYL) 25 mg capsule Take 1 capsule (25 mg total) by mouth every 6 (six) hours as needed for itching. Qty: 30 capsule, Refills: 0    nitrofurantoin, macrocrystal-monohydrate, (MACROBID) 100 MG capsule Take 1 capsule (100 mg total) by mouth once. Qty: 1 capsule, Refills: 0      CONTINUE these medications which have NOT CHANGED   Details  cinacalcet (SENSIPAR) 30 MG tablet Take 30 mg by mouth daily.    Epoetin Alfa (EPOGEN IJ) Inject as directed. Pt gets on dialysis days which are Tuesday, Thursday, and Saturday.    multivitamin (RENA-VIT) TABS tablet Take 1 tablet by mouth daily.    sertraline (ZOLOFT) 25 MG tablet Take 1 tablet (25 mg total) by mouth daily. Qty: 30 tablet, Refills: 0    sevelamer carbonate (RENVELA) 800 MG tablet Take 800 mg by mouth 3 (three) times daily  with meals.     traZODone (DESYREL) 150 MG tablet Take 1 tablet (150 mg total) by mouth at bedtime as needed for sleep. Qty: 30 tablet, Refills: 0    Vitamin D, Ergocalciferol, (DRISDOL) 50000 UNITS CAPS capsule Take 50,000 Units by mouth every 7 (seven) days. Takes on Monday.       Allergies  Allergen Reactions  . Ciprofloxacin Shortness Of Breath, Nausea And Vomiting and Other (See Comments)    HIGH FEVER  . Other Anaphylaxis    Revaclear dialzer  . Peanut-Containing Drug  Products Anaphylaxis  . Aleve [Naproxen Sodium] Other (See Comments)    G.I.Bleed  . Influenza Vaccines Nausea And Vomiting    High fever  . Tetanus Toxoids Nausea And Vomiting and Other (See Comments)    HIGH FEVER  . Latex Itching and Rash      The results of significant diagnostics from this hospitalization (including imaging, microbiology, ancillary and laboratory) are listed below for reference.    Significant Diagnostic Studies: Dg Abd Acute W/chest  07/31/2015  CLINICAL DATA:  25 year old female with a history of abdominal pain, chronic renal failure with hemodialysis, caudal regression. EXAM: DG ABDOMEN ACUTE W/ 1V CHEST COMPARISON:  Multiple prior plain film, most recent 05/26/2013, 05/04/2013, prior CT abdomen 01/18/2012 FINDINGS: Chest: Similar appearance of the configuration of the thoracic cage, with supernumerary right-sided ribs. Similar appearance of the cardiomediastinal silhouette, unchanged in size and contour. No evidence of pulmonary vascular congestion. No pneumothorax or pleural effusion. No confluent airspace disease. Do not appearance of the bilateral hemidiaphragm again, similar prior. Abdomen: Formed stool within the rectum, with more proximal sigmoid colon and left colonic gas. No abnormally distended small bowel or colon. Paucity of small bowel gas. Skeletal manifestations of caudal regression, with narrowing of the pelvic outlet, bilateral acetabular protrusio. IMPRESSION: Chest: No radiographic evidence of acute cardiopulmonary disease. Abdomen: Formed stool within the sigmoid colon rectum, suggesting constipation, with no evidence to suggest bowel obstruction. Musculoskeletal manifestation of caudal regression. Signed, Dulcy Fanny. Earleen Newport, DO [facility] Radiology Electronically Signed   By: Corrie Mckusick D.O.   On: 07/31/2015 13:38   Ct Renal Stone Study  07/31/2015  CLINICAL DATA:  Severe abdominal pain.  Back pain. EXAM:  CT ABDOMEN AND PELVIS WITHOUT CONTRAST TECHNIQUE: Multidetector CT imaging of the abdomen and pelvis was performed following the standard protocol without IV contrast. COMPARISON:  01/18/2012 FINDINGS: Lower chest: No pleural or pericardial effusion. The lung bases are clear. Hepatobiliary: No suspicious liver abnormality identified. Cyst is noted within the right hepatic lobe measuring 11 mm. Gallbladder is normal. No biliary dilatation. Pancreas: Unremarkable. Spleen: The spleen is negative. Adrenals/Urinary Tract: The adrenal glands are poorly visualized. 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 Chronic bilateral hydroureter and Moderate distension of the urinary bladder. Numerous small bladder diverticula are present. Stomach/Bowel: Moderate distension of the stomach. The small bowel loops have a normal caliber. No pathologic dilatation of the proximal colon. There is marked distension of the rectum which is filled with desiccated stool. Large stool ball measures 8.1 x 8.4 x 8.8 cm. Vascular/Lymphatic: Within the limitations of noncontrast technique the vascular structures of the abdomen and pelvis appear grossly unremarkable. No adenopathy identified. Reproductive: New fat containing cystic structure within the left lower quadrant of the abdomen measures 5.2 x 4.8 x 5.0 cm and is concerning for and ovarian dermoid. Other: No free fluid or fluid collections identified within the abdomen  or pelvis. Musculoskeletal: No aggressive lytic or sclerotic bone lesions. Chronic congenital osseous abnormalities are again noted compatible with spina bifida. IMPRESSION: 1. No acute findings identified within the abdomen or pelvis. 2. 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 3. 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 4. Large desiccated stool ball present within the rectum compatible  with impaction. 5. New fat containing cystic structure in the left adnexa compatible with ovarian dermoid. Electronically Signed   By: Kerby Moors M.D.   On: 07/31/2015 14:32    Microbiology: Recent Results (from the past 240 hour(s))  Urine culture     Status: None (Preliminary result)   Collection Time: 07/31/15  2:59 PM  Result Value Ref Range Status   Specimen Description URINE, CATHETERIZED  Final   Special Requests NONE  Final   Culture   Final    CULTURE REINCUBATED FOR BETTER GROWTH Performed at [facility]    Report Status PENDING  Incomplete  MRSA PCR Screening     Status: None   Collection Time: 07/31/15  8:00 PM  Result Value Ref Range Status   MRSA by PCR NEGATIVE NEGATIVE Final    Comment:        The GeneXpert MRSA Assay (FDA approved for NASAL specimens only), is one component of a comprehensive MRSA colonization surveillance program. It is not intended to diagnose MRSA infection nor to guide or monitor treatment for MRSA infections.      Labs: Basic Metabolic Panel:  Recent Labs Lab 07/31/15 1205 08/01/15 0541 08/03/15 0533  NA 142 141 140  K 4.9 5.1 3.9  CL 107 106 95*  CO2 24 23 31   GLUCOSE 97 65 75  BUN 59* 65* 38*  CREATININE 6.45* 7.15* 5.97*  CALCIUM 9.5 9.5 9.6   Liver Function Tests:  Recent Labs Lab 07/31/15 1205  AST 18  ALT 12*  ALKPHOS 142*  BILITOT 0.5  PROT 7.8  ALBUMIN 4.2    Recent Labs Lab 07/31/15 1205  LIPASE 30   No results for input(s): AMMONIA in the last 168 hours. CBC:  Recent Labs Lab 07/31/15 1205 08/01/15 0541 08/01/15 1614  WBC 9.7 6.2 6.1  NEUTROABS 7.7  --   --   HGB 12.2 11.6* 12.0  HCT 38.4 37.2 37.2  MCV 94.6 94.7 92.5  PLT 210 196 192   Cardiac Enzymes: No results for input(s): CKTOTAL, CKMB, CKMBINDEX, TROPONINI in the last 168 hours. BNP: BNP (last 3 results) No results for input(s): BNP in the last 8760 hours.  ProBNP (last 3 results) No results for input(s): PROBNP  in the last 8760 hours.  CBG: No results for input(s): GLUCAP in the last 168 hours.     SignedNita Sells MD   [facility] 08/03/2015, 8:45 AM

## 2015-08-06 ENCOUNTER — Encounter (HOSPITAL_COMMUNITY): Payer: Self-pay | Admitting: Physical Therapy

## 2015-08-06 ENCOUNTER — Encounter (HOSPITAL_COMMUNITY): Payer: Self-pay

## 2015-08-06 ENCOUNTER — Ambulatory Visit (HOSPITAL_COMMUNITY): Payer: Medicare Other | Attending: Family Medicine

## 2015-08-06 DIAGNOSIS — R29898 Other symptoms and signs involving the musculoskeletal system: Secondary | ICD-10-CM | POA: Insufficient documentation

## 2015-08-06 DIAGNOSIS — M25612 Stiffness of left shoulder, not elsewhere classified: Secondary | ICD-10-CM | POA: Diagnosis not present

## 2015-08-06 NOTE — Patient Instructions (Signed)
Strengthening: Chest Pull - Resisted   Hold Theraband in front of body with hands about shoulder width a part. Pull band a part and back together slowly. Repeat _`10-12___ times. Complete __1__ set(s) per session.. Repeat __2__ session(s) per day.  http://orth.exer.us/926   Copyright  VHI. All rights reserved.   PNF Strengthening: Resisted   Standing with resistive band around each hand, bring right arm up and away, thumb back. Repeat _10-12___ times per set. Do __1__ sets per session. Do _2___ sessions per day.    Resisted External Rotation: in Neutral - Bilateral   Sit or stand, tubing in both hands, elbows at sides, bent to 90, forearms forward. Pinch shoulder blades together and rotate forearms out. Keep elbows at sides. Repeat __10-12__ times per set. Do __1__ sets per session. Do _2___ sessions per day.  http://orth.exer.us/966   Copyright  VHI. All rights reserved.   PNF Strengthening: Resisted   Standing, hold resistive band above head. Bring right arm down and out from side. Repeat 10-12____ times per set. Do __1__ sets per session. Do __2__ sessions per day.  http://orth.exer.us/922   Copyright  VHI. All rights reserved.

## 2015-08-06 NOTE — Therapy (Signed)
[facility] [address] [place], [place], [address] Phone: [phone]   Fax:  [phone]  Occupational Therapy Treatment  Patient Details  Name: [name] MRN: [id number] Date of Birth: 10/10/1990 Referring Provider: Parke Simmers  Encounter Date: 08/06/2015      OT End of Session - 08/06/15 1442    Visit Number 3   Number of Visits 12   Authorization Type 1) Medicare 2) Generic Commercial 3) Medicaid   Authorization Time Period before 10th visit   Authorization - Visit Number 2   Authorization - Number of Visits 10   OT Start Time 1358   OT Stop Time 1438   OT Time Calculation (min) 40 min   Activity Tolerance Patient tolerated treatment well   Behavior During Therapy [facility] for tasks assessed/performed      Past Medical History  Diagnosis Date  . Spina bifida   . UTI (lower urinary tract infection)   . HTN (hypertension) 05/02/2011  . Anemia associated with chronic renal failure   . 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  . Blood transfusion   . Dialysis care   . ESRD (end stage renal disease) on dialysis ([facility])   . Depression 05/14/2015    Past Surgical History  Procedure Laterality Date  . Av fistula placement      left arm    There were no vitals filed for this visit.  Visit Diagnosis:  Shoulder stiffness, left  Shoulder weakness      Subjective Assessment - 08/06/15 1440    Subjective  S: When I got out of the hospital I made sure to do my arm exercises because I didn't want it to get weak.    Currently in Pain? No/denies            [facility] OT Assessment - 08/06/15 1408    Assessment   Diagnosis Possible RTC tear - Left   Precautions   Precautions None   AROM   Overall AROM Comments Assessed seated. IR/er adducted   AROM Assessment Site Shoulder   Right/Left Shoulder Left   Left Shoulder Flexion 165 Degrees  previous: 115   Left Shoulder ABduction 168 Degrees  previous: 112   Left Shoulder Internal Rotation 90 Degrees  previous: same   Left Shoulder External Rotation 60 Degrees  previous: 54   PROM   Overall PROM Comments Assessed supine. IR/er adducted   PROM Assessment Site Shoulder   Right/Left Shoulder Left   Left Shoulder Flexion 175 Degrees  previous: 135   Left Shoulder ABduction 180 Degrees  previous: 70   Left Shoulder Internal Rotation 90 Degrees  previous: same   Left Shoulder External Rotation 90 Degrees  previous: 81   Strength   Overall Strength Comments Assessed seated. IR/er adducted.   Strength Assessment Site Shoulder   Right/Left Shoulder Left   Left Shoulder Flexion 4-/5  previous: 3/5   Left Shoulder ABduction 3+/5  previous: 3-/5   Left Shoulder Internal Rotation 4-/5  previous: 3/5   Left Shoulder External Rotation 3+/5  previous: 3-/5                  OT Treatments/Exercises (OP) - 08/06/15 1421    Exercises   Exercises Shoulder   Shoulder Exercises: Supine   Protraction PROM;5 reps   Horizontal ABduction PROM;5 reps   External Rotation PROM;5 reps   Internal Rotation PROM;5 reps   Flexion PROM;5 reps   ABduction  PROM;5 reps   Shoulder Exercises: Seated   Horizontal ABduction Theraband;10 reps   Theraband Level (Shoulder Horizontal ABduction) Level 2 (Red)   External Rotation Theraband;10 reps   Theraband Level (Shoulder External Rotation) Level 2 (Red)   Internal Rotation Theraband;10 reps   Theraband Level (Shoulder Internal Rotation) Level 2 (Red)   Abduction Theraband;10 reps   Theraband Level (Shoulder ABduction) Level 2 (Red)   Manual Therapy   Manual Therapy Myofascial release   Manual therapy comments Manual therapy completed prior to exercises   Myofascial Release Myofascial release to left upper arm, scapularis and trapezius region to decrease fascial restrictions and increase joint mobility in  pain free zone.                 OT Education - 08/06/15 1442    Education provided  Yes   Education Details Strengthening shoulder exercises with red theraband   Person(s) Educated Patient   Methods Explanation;Demonstration;Handout;Verbal cues   Comprehension Returned demonstration;Verbalized understanding          OT Short Term Goals - 08/06/15 1423    OT SHORT TERM GOAL #1   Title Patient will be educated and indepdent with HEP to increase functional mobility of LUE.   Time 3   Period Weeks   Status Achieved   OT SHORT TERM GOAL #2   Title Patient will increase P/ROM to WNL to increase ability to fix hair.    Time 3   Period Weeks   Status Achieved   OT SHORT TERM GOAL #3   Title Patient will increase LUE strength to 3/5 to increase ability to seld propell wheelchair with less pain.    Time 3   Period Weeks   Status Achieved   OT SHORT TERM GOAL #4   Title Patient will decrease pain level to 5/10 when using LUE.    Time 3   Period Weeks   Status Achieved   OT SHORT TERM GOAL #5   Title Patient will decrease fascial restrictions to mod amount to increase functional mobility of LUE.    Time 3   Period Weeks   Status Achieved           OT Long Term Goals - 08/06/15 1428    OT LONG TERM GOAL #1   Title Patient will return to highest level of independence with all daily tasks using LUE.   Time 6   Period Weeks   Status Not Met   OT LONG TERM GOAL #2   Title Patient will increase LUE strength to 4/5 to increase functional performance during transfers.   Time 6   Period Weeks   Status Partially Met   OT LONG TERM GOAL #3   Title Patient will increase A/ROM to [facility] to increase ability to fix hair.   Time 6   Period Weeks   Status Achieved   OT LONG TERM GOAL #4   Title Patient will decrease fascial restrictions to min amount to increase functional mobility needed to propell wheelchair.   Time 6   Period Weeks   Status Partially Met   OT LONG TERM GOAL #5   Title Patient will decrease pain level to 3/10 or less to be able to pick up heavy  items and return to normal cooking tasks.   Time 6   Period Weeks   Status Not Met               Plan - 08/06/15 1443  Clinical Impression Statement A: Mini reassessment completed this date. patient has met all short term goals and 1/5 long term goals with 2 other partially met. patient has shown improvement in all areas including strength and ROM. Pt expresses the desire to continue her therapy at home independently at home with an HEP. HEP was updated and exercises were reviewed with VC.    Plan P: D/C from therapy with HEP.           G-Codes - 2015-08-20 1445    Functional Assessment Tool Used FOTO score: 63/100 (37% impaired)   Functional Limitation Carrying, moving and handling objects   Carrying, Moving and Handling Objects Goal Status (V2003) At least 20 percent but less than 40 percent impaired, limited or restricted   Carrying, Moving and Handling Objects Discharge Status [id number]) At least 20 percent but less than 40 percent impaired, limited or restricted      Problem List Patient Active Problem List   Diagnosis Date Noted  . Pyelonephritis 07/31/2015  . Back pain 07/31/2015  . Abdominal pain 07/31/2015  . Constipation 07/31/2015  . Major depressive disorder, single episode, severe without psychotic features ([place]) 05/15/2015  . Spina bifida ([place]) 01/18/2012  . ESRD (end stage renal disease) on dialysis ([place]) 05/02/2011  . Anemia 05/02/2011  . HTN (hypertension) 05/02/2011   OCCUPATIONAL THERAPY DISCHARGE SUMMARY  Visits from Start of Care: 3  Current functional level related to goals / functional outcomes: See above   Remaining deficits: Pt reports that with continuous use of her left arm she will have pain. Patient also continues to have deficits with shoulder endurance and proximal shoulder strength.    Education / Equipment: Table slides, scapular A/ROM exercises, self myofascial release, shoulder strengthening with theraband (red) Plan: Patient  agrees to discharge.  Patient goals were partially met. Patient is being discharged due to the patient's request.  ?????       Ailene Ravel, OTR/L,CBIS  [phone]  08/20/15, 2:48 PM  [place] [address] [place], [place], [address] Phone: [phone]   Fax:  [phone]  Name: [name] MRN: [id number] Date of Birth: 1990-11-24

## 2015-08-07 ENCOUNTER — Encounter (HOSPITAL_COMMUNITY): Payer: Self-pay | Admitting: Physical Therapy

## 2015-08-07 ENCOUNTER — Encounter (HOSPITAL_COMMUNITY): Payer: Self-pay | Admitting: Occupational Therapy

## 2015-08-07 DIAGNOSIS — N186 End stage renal disease: Secondary | ICD-10-CM | POA: Diagnosis not present

## 2015-08-07 DIAGNOSIS — N2581 Secondary hyperparathyroidism of renal origin: Secondary | ICD-10-CM | POA: Diagnosis not present

## 2015-08-08 ENCOUNTER — Encounter (HOSPITAL_COMMUNITY): Payer: Self-pay | Admitting: Physical Therapy

## 2015-08-08 ENCOUNTER — Encounter (HOSPITAL_COMMUNITY): Payer: Self-pay | Admitting: Occupational Therapy

## 2015-08-09 ENCOUNTER — Encounter (HOSPITAL_COMMUNITY): Payer: Self-pay | Admitting: Occupational Therapy

## 2015-08-09 ENCOUNTER — Encounter (HOSPITAL_COMMUNITY): Payer: Self-pay | Admitting: Physical Therapy

## 2015-08-09 DIAGNOSIS — D631 Anemia in chronic kidney disease: Secondary | ICD-10-CM | POA: Diagnosis not present

## 2015-08-09 DIAGNOSIS — N186 End stage renal disease: Secondary | ICD-10-CM | POA: Diagnosis not present

## 2015-08-11 DIAGNOSIS — N186 End stage renal disease: Secondary | ICD-10-CM | POA: Diagnosis not present

## 2015-08-11 DIAGNOSIS — D631 Anemia in chronic kidney disease: Secondary | ICD-10-CM | POA: Diagnosis not present

## 2015-08-13 ENCOUNTER — Encounter (HOSPITAL_COMMUNITY): Payer: Self-pay | Admitting: Physical Therapy

## 2015-08-13 ENCOUNTER — Encounter (HOSPITAL_COMMUNITY): Payer: Self-pay

## 2015-08-14 DIAGNOSIS — N186 End stage renal disease: Secondary | ICD-10-CM | POA: Diagnosis not present

## 2015-08-14 DIAGNOSIS — D631 Anemia in chronic kidney disease: Secondary | ICD-10-CM | POA: Diagnosis not present

## 2015-08-15 ENCOUNTER — Encounter (HOSPITAL_COMMUNITY): Payer: Self-pay | Admitting: Physical Therapy

## 2015-08-15 ENCOUNTER — Encounter (HOSPITAL_COMMUNITY): Payer: Self-pay | Admitting: Occupational Therapy

## 2015-08-16 DIAGNOSIS — N186 End stage renal disease: Secondary | ICD-10-CM | POA: Diagnosis not present

## 2015-08-16 DIAGNOSIS — D631 Anemia in chronic kidney disease: Secondary | ICD-10-CM | POA: Diagnosis not present

## 2015-08-18 DIAGNOSIS — N186 End stage renal disease: Secondary | ICD-10-CM | POA: Diagnosis not present

## 2015-08-18 DIAGNOSIS — D631 Anemia in chronic kidney disease: Secondary | ICD-10-CM | POA: Diagnosis not present

## 2015-08-21 DIAGNOSIS — N186 End stage renal disease: Secondary | ICD-10-CM | POA: Diagnosis not present

## 2015-08-21 DIAGNOSIS — D631 Anemia in chronic kidney disease: Secondary | ICD-10-CM | POA: Diagnosis not present

## 2015-08-23 DIAGNOSIS — D631 Anemia in chronic kidney disease: Secondary | ICD-10-CM | POA: Diagnosis not present

## 2015-08-23 DIAGNOSIS — N186 End stage renal disease: Secondary | ICD-10-CM | POA: Diagnosis not present

## 2015-08-25 DIAGNOSIS — D631 Anemia in chronic kidney disease: Secondary | ICD-10-CM | POA: Diagnosis not present

## 2015-08-25 DIAGNOSIS — N186 End stage renal disease: Secondary | ICD-10-CM | POA: Diagnosis not present

## 2015-08-28 DIAGNOSIS — Z992 Dependence on renal dialysis: Secondary | ICD-10-CM | POA: Diagnosis not present

## 2015-08-28 DIAGNOSIS — N186 End stage renal disease: Secondary | ICD-10-CM | POA: Diagnosis not present

## 2015-08-28 DIAGNOSIS — I120 Hypertensive chronic kidney disease with stage 5 chronic kidney disease or end stage renal disease: Secondary | ICD-10-CM | POA: Diagnosis not present

## 2015-08-28 DIAGNOSIS — D631 Anemia in chronic kidney disease: Secondary | ICD-10-CM | POA: Diagnosis not present

## 2015-08-30 DIAGNOSIS — D631 Anemia in chronic kidney disease: Secondary | ICD-10-CM | POA: Diagnosis not present

## 2015-08-30 DIAGNOSIS — N186 End stage renal disease: Secondary | ICD-10-CM | POA: Diagnosis not present

## 2015-09-01 DIAGNOSIS — D631 Anemia in chronic kidney disease: Secondary | ICD-10-CM | POA: Diagnosis not present

## 2015-09-01 DIAGNOSIS — N186 End stage renal disease: Secondary | ICD-10-CM | POA: Diagnosis not present

## 2015-09-04 DIAGNOSIS — D631 Anemia in chronic kidney disease: Secondary | ICD-10-CM | POA: Diagnosis not present

## 2015-09-04 DIAGNOSIS — N186 End stage renal disease: Secondary | ICD-10-CM | POA: Diagnosis not present

## 2015-09-06 DIAGNOSIS — N186 End stage renal disease: Secondary | ICD-10-CM | POA: Diagnosis not present

## 2015-09-06 DIAGNOSIS — D631 Anemia in chronic kidney disease: Secondary | ICD-10-CM | POA: Diagnosis not present

## 2015-09-08 DIAGNOSIS — N186 End stage renal disease: Secondary | ICD-10-CM | POA: Diagnosis not present

## 2015-09-08 DIAGNOSIS — D631 Anemia in chronic kidney disease: Secondary | ICD-10-CM | POA: Diagnosis not present

## 2015-09-11 DIAGNOSIS — D631 Anemia in chronic kidney disease: Secondary | ICD-10-CM | POA: Diagnosis not present

## 2015-09-11 DIAGNOSIS — N186 End stage renal disease: Secondary | ICD-10-CM | POA: Diagnosis not present

## 2015-09-12 ENCOUNTER — Encounter (HOSPITAL_COMMUNITY): Payer: Self-pay | Admitting: Emergency Medicine

## 2015-09-12 ENCOUNTER — Emergency Department (HOSPITAL_COMMUNITY)
Admission: EM | Admit: 2015-09-12 | Discharge: 2015-09-13 | Disposition: A | Discharge status: Home / Self Care | Payer: Medicare Other | Attending: Emergency Medicine | Admitting: Emergency Medicine

## 2015-09-12 ENCOUNTER — Emergency Department (HOSPITAL_COMMUNITY): Payer: Medicare Other

## 2015-09-12 DIAGNOSIS — Z8659 Personal history of other mental and behavioral disorders: Secondary | ICD-10-CM | POA: Insufficient documentation

## 2015-09-12 DIAGNOSIS — Q059 Spina bifida, unspecified: Secondary | ICD-10-CM | POA: Diagnosis not present

## 2015-09-12 DIAGNOSIS — N3289 Other specified disorders of bladder: Secondary | ICD-10-CM | POA: Diagnosis not present

## 2015-09-12 DIAGNOSIS — N390 Urinary tract infection, site not specified: Secondary | ICD-10-CM | POA: Diagnosis not present

## 2015-09-12 DIAGNOSIS — R509 Fever, unspecified: Secondary | ICD-10-CM | POA: Diagnosis present

## 2015-09-12 DIAGNOSIS — M542 Cervicalgia: Secondary | ICD-10-CM | POA: Insufficient documentation

## 2015-09-12 DIAGNOSIS — Q7649 Other congenital malformations of spine, not associated with scoliosis: Secondary | ICD-10-CM | POA: Insufficient documentation

## 2015-09-12 DIAGNOSIS — N186 End stage renal disease: Secondary | ICD-10-CM | POA: Diagnosis not present

## 2015-09-12 DIAGNOSIS — Z91040 Latex allergy status: Secondary | ICD-10-CM | POA: Insufficient documentation

## 2015-09-12 DIAGNOSIS — D649 Anemia, unspecified: Secondary | ICD-10-CM | POA: Insufficient documentation

## 2015-09-12 DIAGNOSIS — Z3202 Encounter for pregnancy test, result negative: Secondary | ICD-10-CM | POA: Insufficient documentation

## 2015-09-12 DIAGNOSIS — Z992 Dependence on renal dialysis: Secondary | ICD-10-CM | POA: Insufficient documentation

## 2015-09-12 DIAGNOSIS — Z792 Long term (current) use of antibiotics: Secondary | ICD-10-CM | POA: Diagnosis not present

## 2015-09-12 DIAGNOSIS — Z79899 Other long term (current) drug therapy: Secondary | ICD-10-CM | POA: Diagnosis not present

## 2015-09-12 DIAGNOSIS — I120 Hypertensive chronic kidney disease with stage 5 chronic kidney disease or end stage renal disease: Secondary | ICD-10-CM | POA: Insufficient documentation

## 2015-09-12 LAB — CBC
HCT: 34 % — ABNORMAL LOW (ref 36.0–46.0)
HEMOGLOBIN: 10.9 g/dL — AB (ref 12.0–15.0)
MCH: 28.9 pg (ref 26.0–34.0)
MCHC: 32.1 g/dL (ref 30.0–36.0)
MCV: 90.2 fL (ref 78.0–100.0)
Platelets: 215 10*3/uL (ref 150–400)
RBC: 3.77 MIL/uL — AB (ref 3.87–5.11)
RDW: 14.8 % (ref 11.5–15.5)
WBC: 6.1 10*3/uL (ref 4.0–10.5)

## 2015-09-12 LAB — COMPREHENSIVE METABOLIC PANEL
ALT: 19 U/L (ref 14–54)
ANION GAP: 12 (ref 5–15)
AST: 26 U/L (ref 15–41)
Albumin: 4.2 g/dL (ref 3.5–5.0)
Alkaline Phosphatase: 158 U/L — ABNORMAL HIGH (ref 38–126)
BUN: 47 mg/dL — ABNORMAL HIGH (ref 6–20)
CALCIUM: 9.1 mg/dL (ref 8.9–10.3)
CHLORIDE: 100 mmol/L — AB (ref 101–111)
CO2: 25 mmol/L (ref 22–32)
Creatinine, Ser: 5.34 mg/dL — ABNORMAL HIGH (ref 0.44–1.00)
GFR calc non Af Amer: 10 mL/min — ABNORMAL LOW (ref >60)
GFR, EST AFRICAN AMERICAN: 12 mL/min — AB (ref >60)
Glucose, Bld: 107 mg/dL — ABNORMAL HIGH (ref 65–99)
Potassium: 3.9 mmol/L (ref 3.5–5.1)
SODIUM: 137 mmol/L (ref 135–145)
Total Bilirubin: 0.4 mg/dL (ref 0.3–1.2)
Total Protein: 7.9 g/dL (ref 6.5–8.1)

## 2015-09-12 LAB — LIPASE, BLOOD: LIPASE: 84 U/L — AB (ref 11–51)

## 2015-09-12 NOTE — ED Provider Notes (Signed)
CSN: [id number]     Arrival date & time 09/12/15  2128 History  By signing my name below, I, [name], attest that this documentation has been prepared under the direction and in the presence of Orpah Greek, MD. Electronically Signed: [name], ED Scribe. 09/12/2015. 11:34 PM.     Chief Complaint  Patient presents with  . Fever  . Headache  . Emesis   The history is provided by the patient. No language interpreter was used.   HPI Comments: [name] is a 25 y.o. female with a PMHx of spina bifida, HTN, and ESRD presents to the Emergency Department complaining of fever (Tmax 102.1) and headache onset 4 days ago and emesis onset 2 days ago. She notes her temperature was taken while at dialysis and she was found to have a fever. She reports associated left-sided neck pain, BLE pain, lower back pain, nausea, loss of appetite, left-sided abdominal pain, and chest pain when lying supine. She states her sister has recently been sick with something similar. She notes she urinates every day and has been on dialysis for the past 6 years. Pt denies diarrhea.   Past Medical History  Diagnosis Date  . Spina bifida   . UTI (lower urinary tract infection)   . HTN (hypertension) 05/02/2011  . Anemia associated with chronic renal failure   . 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  . Blood transfusion   . Dialysis care   . ESRD (end stage renal disease) on dialysis ([place])   . Depression 05/14/2015   Past Surgical History  Procedure Laterality Date  . Av fistula placement      left arm   Family History  Problem Relation Age of Onset  . Kidney cancer Other    Social History  Substance Use Topics  . Smoking status: Never Smoker   . Smokeless tobacco: None  . Alcohol Use: No   OB History    No data available     Review of Systems  Constitutional: Positive for fever and appetite change.  Cardiovascular: Positive for chest pain (with lying supine  only).  Gastrointestinal: Positive for nausea, vomiting and abdominal pain. Negative for diarrhea.  Musculoskeletal: Positive for myalgias, back pain and neck pain.  Neurological: Positive for headaches.  All other systems reviewed and are negative.   Allergies  Ciprofloxacin; Other; Peanut-containing drug products; Aleve; Influenza vaccines; Tetanus toxoids; and Latex  Home Medications   Prior to Admission medications   Medication Sig Start Date End Date Taking? Authorizing Provider  cinacalcet (SENSIPAR) 30 MG tablet Take 30 mg by mouth daily.   Yes Historical Provider, MD  diphenhydrAMINE (BENADRYL) 25 mg capsule Take 1 capsule (25 mg total) by mouth every 6 (six) hours as needed for itching. 08/03/15  Yes Nita Sells, MD  Epoetin Alfa (EPOGEN IJ) Inject as directed. Pt gets on dialysis days which are Tuesday, Thursday, and Saturday.   Yes Historical Provider, MD  sevelamer carbonate (RENVELA) 800 MG tablet Take 800 mg by mouth 3 (three) times daily with meals.    Yes Historical Provider, MD  Vitamin D, Ergocalciferol, (DRISDOL) 50000 UNITS CAPS capsule Take 50,000 Units by mouth every 7 (seven) days. Takes on Monday.   Yes Historical Provider, MD  cefUROXime (CEFTIN) 250 MG tablet Take 1 tablet (250 mg total) by mouth 2 (two) times daily with a meal. 09/13/15   Orpah Greek, MD  HYDROcodone-acetaminophen (NORCO/VICODIN) 5-325 MG tablet Take 2 tablets  by mouth every 4 (four) hours as needed for moderate pain. 09/13/15   Orpah Greek, MD  nitrofurantoin, macrocrystal-monohydrate, (MACROBID) 100 MG capsule Take 1 capsule (100 mg total) by mouth once. Patient not taking: Reported on 09/12/2015 08/03/15   Nita Sells, MD  ondansetron (ZOFRAN) 4 MG tablet Take 1 tablet (4 mg total) by mouth every 6 (six) hours. 09/13/15   Orpah Greek, MD  sertraline (ZOLOFT) 25 MG tablet Take 1 tablet (25 mg total) by mouth daily. Patient not taking: Reported on 09/12/2015  05/18/15   Hildred Priest, MD  traZODone (DESYREL) 150 MG tablet Take 1 tablet (150 mg total) by mouth at bedtime as needed for sleep. Patient not taking: Reported on 09/12/2015 05/18/15   Hildred Priest, MD   BP 115/71 mmHg  Pulse 93  Temp(Src) 98 F (36.7 C) (Oral)  Resp 18  SpO2 95%  LMP  Physical Exam  Constitutional: She is oriented to person, place, and time. She appears well-developed and well-nourished. No distress.  HENT:  Head: Normocephalic and atraumatic.  Right Ear: Hearing normal.  Left Ear: Hearing normal.  Nose: Nose normal.  Mouth/Throat: Oropharynx is clear and moist and mucous membranes are normal.  Eyes: Conjunctivae and EOM are normal. Pupils are equal, round, and reactive to light.  Neck: Normal range of motion. Neck supple.  Cardiovascular: Regular rhythm, S1 normal and S2 normal.  Exam reveals no gallop and no friction rub.   No murmur heard. Pulmonary/Chest: Effort normal and breath sounds normal. No respiratory distress. She exhibits no tenderness.  Abdominal: Soft. Normal appearance and bowel sounds are normal. There is no hepatosplenomegaly. There is tenderness. There is no rebound, no guarding, no tenderness at McBurney's point and negative Murphy's sign. No hernia.  Left-sided abdominal TTP  Musculoskeletal: Normal range of motion.  Neurological: She is alert and oriented to person, place, and time. She has normal strength. No cranial nerve deficit or sensory deficit. Coordination normal. GCS eye subscore is 4. GCS verbal subscore is 5. GCS motor subscore is 6.  Skin: Skin is warm, dry and intact. No rash noted. No cyanosis.  Psychiatric: She has a normal mood and affect. Her speech is normal and behavior is normal. Thought content normal.  Nursing note and vitals reviewed.   ED Course  Procedures  DIAGNOSTIC STUDIES: Oxygen Saturation is 100% on RA, normal by my interpretation.    COORDINATION OF CARE: 11:25 PM - Discussed  plans to order diagnostic studies and imaging. Pt advised of plan for treatment and pt agrees.  Labs Review Labs Reviewed  LIPASE, BLOOD - Abnormal; Notable for the following:    Lipase 84 (*)    All other components within normal limits  COMPREHENSIVE METABOLIC PANEL - Abnormal; Notable for the following:    Chloride 100 (*)    Glucose, Bld 107 (*)    BUN 47 (*)    Creatinine, Ser 5.34 (*)    Alkaline Phosphatase 158 (*)    GFR calc non Af Amer 10 (*)    GFR calc Af Amer 12 (*)    All other components within normal limits  CBC - Abnormal; Notable for the following:    RBC 3.77 (*)    Hemoglobin 10.9 (*)    HCT 34.0 (*)    All other components within normal limits  URINALYSIS, ROUTINE W REFLEX MICROSCOPIC (NOT AT [facility]) - Abnormal; Notable for the following:    APPearance CLOUDY (*)    pH 8.5 (*)  Hgb urine dipstick SMALL (*)    Protein, ur 100 (*)    Leukocytes, UA LARGE (*)    All other components within normal limits  URINE MICROSCOPIC-ADD ON - Abnormal; Notable for the following:    Squamous Epithelial / LPF 0-5 (*)    Bacteria, UA FEW (*)    Casts HYALINE CASTS (*)    All other components within normal limits  POC URINE PREG, ED    Imaging Review Ct Abdomen Pelvis W Contrast  09/13/2015  CLINICAL DATA:  25 year old female with fever, nausea vomiting, and low back pain and abdominal pain. EXAM: CT ABDOMEN AND PELVIS WITH CONTRAST TECHNIQUE: Multidetector CT imaging of the abdomen and pelvis was performed using the standard protocol following bolus administration of intravenous contrast. CONTRAST:  24mL OMNIPAQUE IOHEXOL 300 MG/ML  SOLN COMPARISON:  CT dated 07/31/2015 FINDINGS: The visualized lung bases are clear. No intra-abdominal free air or free fluid identified. Stable lobulated appearing of the liver contour. Stable small right hepatic hypodense lesions are incompletely characterized and this CT but likely represent cysts or hemangioma. The gallbladder, pancreas,  spleen appear unremarkable. The adrenal glands are not well visualized. 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 There is chronic appearing bilateral hydroureter. There is thickened and trabeculated appearance of the urinary bladder compatible with chronic bladder outlet obstruction. Correlation with urinalysis recommended to exclude UTI. The uterus is not well visualized. 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 Large amount of dense stool noted within the rectosigmoid compatible with fecal impaction. There is no evidence of small-bowel obstruction. The appendix is not visualized with certainty. No inflammatory changes identified in the right lower quadrant. The abdominal aorta and IVC appear grossly unremarkable. No portal venous gas identified. There is no adenopathy. 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 No acute fracture. IMPRESSION: Large stool ball within the rectosigmoid compatible with fecal impaction. No evidence of small-bowel obstruction. Diffuse Bladder bladder wall thickening and trabecular appearance likely from chronic bladder outlet obstruction. Correlation with urinalysis recommended to exclude superimposed UTI. Other nonacute findings similar to prior study as described above. Electronically Signed   By: Anner Crete M.D.   On: 09/13/2015 02:13   I have personally reviewed and evaluated these images and lab results as part of my medical decision-making.   EKG Interpretation None      MDM   Final diagnoses:  UTI (lower urinary tract infection)   Patient presents to the ER for evaluation of fever, headache, nausea, vomiting and lower abdominal pain. Symptoms ongoing for 5 days. Patient is a dialysis patient, however, she does make urine daily and self caths. Her workup has revealed evidence of infection. She does not appear septic. I did recommend and offer  admission to her because of her comorbidities and possibility of worsening, but she declines admission. She was therefore observed for an extended period of time after having received IV fluids, analgesia. She continues to do well and wishes to go home. She was provided prescription for Ceftin and analgesia.  I personally performed the services described in this documentation, which was scribed in my presence. The recorded information has been reviewed and is accurate.   Orpah Greek, MD 09/14/15 [phone]

## 2015-09-12 NOTE — ED Notes (Addendum)
Patient presents for fever (102.1 at home), headache, emesis, LUQ abdominal pain, decreased UO x5 days. Dialysis patient Tuesday, Thursday, Saturday. Does self caths at home.

## 2015-09-13 ENCOUNTER — Encounter (HOSPITAL_COMMUNITY): Payer: Self-pay

## 2015-09-13 DIAGNOSIS — N3289 Other specified disorders of bladder: Secondary | ICD-10-CM | POA: Diagnosis not present

## 2015-09-13 DIAGNOSIS — N390 Urinary tract infection, site not specified: Secondary | ICD-10-CM | POA: Diagnosis not present

## 2015-09-13 DIAGNOSIS — N186 End stage renal disease: Secondary | ICD-10-CM | POA: Diagnosis not present

## 2015-09-13 DIAGNOSIS — D631 Anemia in chronic kidney disease: Secondary | ICD-10-CM | POA: Diagnosis not present

## 2015-09-13 LAB — URINALYSIS, ROUTINE W REFLEX MICROSCOPIC
BILIRUBIN URINE: NEGATIVE
GLUCOSE, UA: NEGATIVE mg/dL
KETONES UR: NEGATIVE mg/dL
NITRITE: NEGATIVE
PROTEIN: 100 mg/dL — AB
Specific Gravity, Urine: 1.009 (ref 1.005–1.030)
pH: 8.5 — ABNORMAL HIGH (ref 5.0–8.0)

## 2015-09-13 LAB — URINE MICROSCOPIC-ADD ON

## 2015-09-13 LAB — POC URINE PREG, ED: Preg Test, Ur: NEGATIVE

## 2015-09-13 MED ORDER — IOHEXOL 300 MG/ML  SOLN
40.0000 mL | Freq: Once | INTRAMUSCULAR | Status: AC | PRN
  Administered 2015-09-13: 40 mL via INTRAVENOUS

## 2015-09-13 MED ORDER — DEXTROSE 5 % IV SOLN
1.0000 g | Freq: Once | INTRAVENOUS | Status: AC
  Administered 2015-09-13: 1 g via INTRAVENOUS
  Filled 2015-09-13: qty 10

## 2015-09-13 MED ORDER — HYDROMORPHONE HCL 1 MG/ML IJ SOLN
1.0000 mg | Freq: Once | INTRAMUSCULAR | Status: AC
  Administered 2015-09-13: 1 mg via INTRAVENOUS
  Filled 2015-09-13: qty 1

## 2015-09-13 MED ORDER — HYDROCODONE-ACETAMINOPHEN 5-325 MG PO TABS: 2.0000 | ORAL_TABLET | ORAL | Status: DC | PRN

## 2015-09-13 MED ORDER — DIPHENHYDRAMINE HCL 50 MG/ML IJ SOLN
25.0000 mg | Freq: Once | INTRAMUSCULAR | Status: AC
  Administered 2015-09-13: 25 mg via INTRAVENOUS
  Filled 2015-09-13: qty 1

## 2015-09-13 MED ORDER — CEFUROXIME AXETIL 250 MG PO TABS: 250.0000 mg | ORAL_TABLET | Freq: Two times a day (BID) | ORAL | Status: DC

## 2015-09-13 MED ORDER — ONDANSETRON HCL 4 MG/2ML IJ SOLN
4.0000 mg | Freq: Once | INTRAMUSCULAR | Status: AC
  Administered 2015-09-13: 4 mg via INTRAVENOUS
  Filled 2015-09-13: qty 2

## 2015-09-13 MED ORDER — ONDANSETRON HCL 4 MG PO TABS: 4.0000 mg | ORAL_TABLET | Freq: Four times a day (QID) | ORAL | Status: DC

## 2015-09-13 MED ORDER — MORPHINE SULFATE (PF) 2 MG/ML IV SOLN
2.0000 mg | Freq: Once | INTRAVENOUS | Status: DC
  Filled 2015-09-13: qty 1

## 2015-09-13 NOTE — Discharge Instructions (Signed)

## 2015-09-15 DIAGNOSIS — D631 Anemia in chronic kidney disease: Secondary | ICD-10-CM | POA: Diagnosis not present

## 2015-09-15 DIAGNOSIS — N186 End stage renal disease: Secondary | ICD-10-CM | POA: Diagnosis not present

## 2015-09-18 DIAGNOSIS — N186 End stage renal disease: Secondary | ICD-10-CM | POA: Diagnosis not present

## 2015-09-18 DIAGNOSIS — D631 Anemia in chronic kidney disease: Secondary | ICD-10-CM | POA: Diagnosis not present

## 2015-09-20 DIAGNOSIS — D631 Anemia in chronic kidney disease: Secondary | ICD-10-CM | POA: Diagnosis not present

## 2015-09-20 DIAGNOSIS — N186 End stage renal disease: Secondary | ICD-10-CM | POA: Diagnosis not present

## 2015-09-22 DIAGNOSIS — N186 End stage renal disease: Secondary | ICD-10-CM | POA: Diagnosis not present

## 2015-09-22 DIAGNOSIS — D631 Anemia in chronic kidney disease: Secondary | ICD-10-CM | POA: Diagnosis not present

## 2015-09-25 DIAGNOSIS — I120 Hypertensive chronic kidney disease with stage 5 chronic kidney disease or end stage renal disease: Secondary | ICD-10-CM | POA: Diagnosis not present

## 2015-09-25 DIAGNOSIS — D631 Anemia in chronic kidney disease: Secondary | ICD-10-CM | POA: Diagnosis not present

## 2015-09-25 DIAGNOSIS — N186 End stage renal disease: Secondary | ICD-10-CM | POA: Diagnosis not present

## 2015-09-25 DIAGNOSIS — Z992 Dependence on renal dialysis: Secondary | ICD-10-CM | POA: Diagnosis not present

## 2015-09-27 DIAGNOSIS — D631 Anemia in chronic kidney disease: Secondary | ICD-10-CM | POA: Diagnosis not present

## 2015-09-27 DIAGNOSIS — N2581 Secondary hyperparathyroidism of renal origin: Secondary | ICD-10-CM | POA: Diagnosis not present

## 2015-09-27 DIAGNOSIS — N186 End stage renal disease: Secondary | ICD-10-CM | POA: Diagnosis not present

## 2015-09-27 DIAGNOSIS — E875 Hyperkalemia: Secondary | ICD-10-CM | POA: Diagnosis not present

## 2015-09-28 DIAGNOSIS — Z992 Dependence on renal dialysis: Secondary | ICD-10-CM | POA: Diagnosis not present

## 2015-09-28 DIAGNOSIS — M75102 Unspecified rotator cuff tear or rupture of left shoulder, not specified as traumatic: Secondary | ICD-10-CM | POA: Diagnosis not present

## 2015-09-29 DIAGNOSIS — E875 Hyperkalemia: Secondary | ICD-10-CM | POA: Diagnosis not present

## 2015-09-29 DIAGNOSIS — N186 End stage renal disease: Secondary | ICD-10-CM | POA: Diagnosis not present

## 2015-09-29 DIAGNOSIS — D631 Anemia in chronic kidney disease: Secondary | ICD-10-CM | POA: Diagnosis not present

## 2015-09-29 DIAGNOSIS — N2581 Secondary hyperparathyroidism of renal origin: Secondary | ICD-10-CM | POA: Diagnosis not present

## 2015-10-02 DIAGNOSIS — E875 Hyperkalemia: Secondary | ICD-10-CM | POA: Diagnosis not present

## 2015-10-02 DIAGNOSIS — N2581 Secondary hyperparathyroidism of renal origin: Secondary | ICD-10-CM | POA: Diagnosis not present

## 2015-10-02 DIAGNOSIS — D631 Anemia in chronic kidney disease: Secondary | ICD-10-CM | POA: Diagnosis not present

## 2015-10-02 DIAGNOSIS — N186 End stage renal disease: Secondary | ICD-10-CM | POA: Diagnosis not present

## 2015-10-04 DIAGNOSIS — D631 Anemia in chronic kidney disease: Secondary | ICD-10-CM | POA: Diagnosis not present

## 2015-10-04 DIAGNOSIS — N2581 Secondary hyperparathyroidism of renal origin: Secondary | ICD-10-CM | POA: Diagnosis not present

## 2015-10-04 DIAGNOSIS — E875 Hyperkalemia: Secondary | ICD-10-CM | POA: Diagnosis not present

## 2015-10-04 DIAGNOSIS — N186 End stage renal disease: Secondary | ICD-10-CM | POA: Diagnosis not present

## 2015-10-06 DIAGNOSIS — N2581 Secondary hyperparathyroidism of renal origin: Secondary | ICD-10-CM | POA: Diagnosis not present

## 2015-10-06 DIAGNOSIS — D631 Anemia in chronic kidney disease: Secondary | ICD-10-CM | POA: Diagnosis not present

## 2015-10-06 DIAGNOSIS — N186 End stage renal disease: Secondary | ICD-10-CM | POA: Diagnosis not present

## 2015-10-06 DIAGNOSIS — E875 Hyperkalemia: Secondary | ICD-10-CM | POA: Diagnosis not present

## 2015-10-09 DIAGNOSIS — E875 Hyperkalemia: Secondary | ICD-10-CM | POA: Diagnosis not present

## 2015-10-09 DIAGNOSIS — N186 End stage renal disease: Secondary | ICD-10-CM | POA: Diagnosis not present

## 2015-10-09 DIAGNOSIS — N2581 Secondary hyperparathyroidism of renal origin: Secondary | ICD-10-CM | POA: Diagnosis not present

## 2015-10-09 DIAGNOSIS — D631 Anemia in chronic kidney disease: Secondary | ICD-10-CM | POA: Diagnosis not present

## 2015-10-11 DIAGNOSIS — E875 Hyperkalemia: Secondary | ICD-10-CM | POA: Diagnosis not present

## 2015-10-11 DIAGNOSIS — N186 End stage renal disease: Secondary | ICD-10-CM | POA: Diagnosis not present

## 2015-10-11 DIAGNOSIS — N2581 Secondary hyperparathyroidism of renal origin: Secondary | ICD-10-CM | POA: Diagnosis not present

## 2015-10-11 DIAGNOSIS — D631 Anemia in chronic kidney disease: Secondary | ICD-10-CM | POA: Diagnosis not present

## 2015-10-13 DIAGNOSIS — E875 Hyperkalemia: Secondary | ICD-10-CM | POA: Diagnosis not present

## 2015-10-13 DIAGNOSIS — N2581 Secondary hyperparathyroidism of renal origin: Secondary | ICD-10-CM | POA: Diagnosis not present

## 2015-10-13 DIAGNOSIS — D631 Anemia in chronic kidney disease: Secondary | ICD-10-CM | POA: Diagnosis not present

## 2015-10-13 DIAGNOSIS — N186 End stage renal disease: Secondary | ICD-10-CM | POA: Diagnosis not present

## 2015-10-16 DIAGNOSIS — D631 Anemia in chronic kidney disease: Secondary | ICD-10-CM | POA: Diagnosis not present

## 2015-10-16 DIAGNOSIS — N2581 Secondary hyperparathyroidism of renal origin: Secondary | ICD-10-CM | POA: Diagnosis not present

## 2015-10-16 DIAGNOSIS — N186 End stage renal disease: Secondary | ICD-10-CM | POA: Diagnosis not present

## 2015-10-16 DIAGNOSIS — E875 Hyperkalemia: Secondary | ICD-10-CM | POA: Diagnosis not present

## 2015-10-18 DIAGNOSIS — E875 Hyperkalemia: Secondary | ICD-10-CM | POA: Diagnosis not present

## 2015-10-18 DIAGNOSIS — D631 Anemia in chronic kidney disease: Secondary | ICD-10-CM | POA: Diagnosis not present

## 2015-10-18 DIAGNOSIS — N2581 Secondary hyperparathyroidism of renal origin: Secondary | ICD-10-CM | POA: Diagnosis not present

## 2015-10-18 DIAGNOSIS — N186 End stage renal disease: Secondary | ICD-10-CM | POA: Diagnosis not present

## 2015-10-20 DIAGNOSIS — E875 Hyperkalemia: Secondary | ICD-10-CM | POA: Diagnosis not present

## 2015-10-20 DIAGNOSIS — N186 End stage renal disease: Secondary | ICD-10-CM | POA: Diagnosis not present

## 2015-10-20 DIAGNOSIS — D631 Anemia in chronic kidney disease: Secondary | ICD-10-CM | POA: Diagnosis not present

## 2015-10-20 DIAGNOSIS — N2581 Secondary hyperparathyroidism of renal origin: Secondary | ICD-10-CM | POA: Diagnosis not present

## 2015-10-23 DIAGNOSIS — N2581 Secondary hyperparathyroidism of renal origin: Secondary | ICD-10-CM | POA: Diagnosis not present

## 2015-10-23 DIAGNOSIS — N186 End stage renal disease: Secondary | ICD-10-CM | POA: Diagnosis not present

## 2015-10-23 DIAGNOSIS — D631 Anemia in chronic kidney disease: Secondary | ICD-10-CM | POA: Diagnosis not present

## 2015-10-23 DIAGNOSIS — E875 Hyperkalemia: Secondary | ICD-10-CM | POA: Diagnosis not present

## 2015-10-25 DIAGNOSIS — E875 Hyperkalemia: Secondary | ICD-10-CM | POA: Diagnosis not present

## 2015-10-25 DIAGNOSIS — N2581 Secondary hyperparathyroidism of renal origin: Secondary | ICD-10-CM | POA: Diagnosis not present

## 2015-10-25 DIAGNOSIS — D631 Anemia in chronic kidney disease: Secondary | ICD-10-CM | POA: Diagnosis not present

## 2015-10-25 DIAGNOSIS — N186 End stage renal disease: Secondary | ICD-10-CM | POA: Diagnosis not present

## 2015-10-26 DIAGNOSIS — N186 End stage renal disease: Secondary | ICD-10-CM | POA: Diagnosis not present

## 2015-10-26 DIAGNOSIS — Z992 Dependence on renal dialysis: Secondary | ICD-10-CM | POA: Diagnosis not present

## 2015-10-26 DIAGNOSIS — I120 Hypertensive chronic kidney disease with stage 5 chronic kidney disease or end stage renal disease: Secondary | ICD-10-CM | POA: Diagnosis not present

## 2015-10-27 DIAGNOSIS — N2581 Secondary hyperparathyroidism of renal origin: Secondary | ICD-10-CM | POA: Diagnosis not present

## 2015-10-27 DIAGNOSIS — N186 End stage renal disease: Secondary | ICD-10-CM | POA: Diagnosis not present

## 2015-10-27 DIAGNOSIS — D631 Anemia in chronic kidney disease: Secondary | ICD-10-CM | POA: Diagnosis not present

## 2015-10-30 DIAGNOSIS — N186 End stage renal disease: Secondary | ICD-10-CM | POA: Diagnosis not present

## 2015-10-30 DIAGNOSIS — N2581 Secondary hyperparathyroidism of renal origin: Secondary | ICD-10-CM | POA: Diagnosis not present

## 2015-10-30 DIAGNOSIS — D631 Anemia in chronic kidney disease: Secondary | ICD-10-CM | POA: Diagnosis not present

## 2015-11-02 DIAGNOSIS — N186 End stage renal disease: Secondary | ICD-10-CM | POA: Diagnosis not present

## 2015-11-02 DIAGNOSIS — N2581 Secondary hyperparathyroidism of renal origin: Secondary | ICD-10-CM | POA: Diagnosis not present

## 2015-11-02 DIAGNOSIS — D631 Anemia in chronic kidney disease: Secondary | ICD-10-CM | POA: Diagnosis not present

## 2015-11-03 DIAGNOSIS — N2581 Secondary hyperparathyroidism of renal origin: Secondary | ICD-10-CM | POA: Diagnosis not present

## 2015-11-03 DIAGNOSIS — D631 Anemia in chronic kidney disease: Secondary | ICD-10-CM | POA: Diagnosis not present

## 2015-11-03 DIAGNOSIS — N186 End stage renal disease: Secondary | ICD-10-CM | POA: Diagnosis not present

## 2015-11-04 ENCOUNTER — Encounter (HOSPITAL_COMMUNITY): Payer: Self-pay | Admitting: Emergency Medicine

## 2015-11-04 ENCOUNTER — Emergency Department (HOSPITAL_COMMUNITY)
Admission: EM | Admit: 2015-11-04 | Discharge: 2015-11-05 | Disposition: A | Discharge status: Home / Self Care | Payer: Medicare Other | Attending: Emergency Medicine | Admitting: Emergency Medicine

## 2015-11-04 DIAGNOSIS — Z3202 Encounter for pregnancy test, result negative: Secondary | ICD-10-CM | POA: Diagnosis not present

## 2015-11-04 DIAGNOSIS — N186 End stage renal disease: Secondary | ICD-10-CM | POA: Diagnosis not present

## 2015-11-04 DIAGNOSIS — I120 Hypertensive chronic kidney disease with stage 5 chronic kidney disease or end stage renal disease: Secondary | ICD-10-CM | POA: Diagnosis not present

## 2015-11-04 DIAGNOSIS — F329 Major depressive disorder, single episode, unspecified: Secondary | ICD-10-CM | POA: Diagnosis not present

## 2015-11-04 DIAGNOSIS — Z91040 Latex allergy status: Secondary | ICD-10-CM | POA: Insufficient documentation

## 2015-11-04 DIAGNOSIS — N390 Urinary tract infection, site not specified: Secondary | ICD-10-CM | POA: Insufficient documentation

## 2015-11-04 DIAGNOSIS — Q7203 Congenital complete absence of lower limb, bilateral: Secondary | ICD-10-CM | POA: Insufficient documentation

## 2015-11-04 DIAGNOSIS — R112 Nausea with vomiting, unspecified: Secondary | ICD-10-CM | POA: Diagnosis not present

## 2015-11-04 DIAGNOSIS — Q7649 Other congenital malformations of spine, not associated with scoliosis: Secondary | ICD-10-CM | POA: Insufficient documentation

## 2015-11-04 DIAGNOSIS — Z862 Personal history of diseases of the blood and blood-forming organs and certain disorders involving the immune mechanism: Secondary | ICD-10-CM | POA: Insufficient documentation

## 2015-11-04 DIAGNOSIS — R42 Dizziness and giddiness: Secondary | ICD-10-CM | POA: Diagnosis present

## 2015-11-04 DIAGNOSIS — Z992 Dependence on renal dialysis: Secondary | ICD-10-CM | POA: Diagnosis not present

## 2015-11-04 DIAGNOSIS — Z79899 Other long term (current) drug therapy: Secondary | ICD-10-CM | POA: Diagnosis not present

## 2015-11-04 NOTE — ED Notes (Addendum)
Pt states that she has had N/V and dizziness today. States she fell and hit her head. Last dialysis Saturday. Alert and oriented.

## 2015-11-05 DIAGNOSIS — N390 Urinary tract infection, site not specified: Secondary | ICD-10-CM | POA: Diagnosis not present

## 2015-11-05 LAB — URINALYSIS, ROUTINE W REFLEX MICROSCOPIC
Bilirubin Urine: NEGATIVE
Glucose, UA: NEGATIVE mg/dL
KETONES UR: NEGATIVE mg/dL
NITRITE: NEGATIVE
PROTEIN: 100 mg/dL — AB
Specific Gravity, Urine: 1.01 (ref 1.005–1.030)
pH: 8.5 — ABNORMAL HIGH (ref 5.0–8.0)

## 2015-11-05 LAB — CBC
HEMATOCRIT: 38.2 % (ref 36.0–46.0)
HEMOGLOBIN: 12.3 g/dL (ref 12.0–15.0)
MCH: 29.4 pg (ref 26.0–34.0)
MCHC: 32.2 g/dL (ref 30.0–36.0)
MCV: 91.2 fL (ref 78.0–100.0)
PLATELETS: 195 10*3/uL (ref 150–400)
RBC: 4.19 MIL/uL (ref 3.87–5.11)
RDW: 13.9 % (ref 11.5–15.5)
WBC: 4.2 10*3/uL (ref 4.0–10.5)

## 2015-11-05 LAB — COMPREHENSIVE METABOLIC PANEL
ALBUMIN: 4.3 g/dL (ref 3.5–5.0)
ALT: 12 U/L — ABNORMAL LOW (ref 14–54)
ANION GAP: 16 — AB (ref 5–15)
AST: 21 U/L (ref 15–41)
Alkaline Phosphatase: 148 U/L — ABNORMAL HIGH (ref 38–126)
BUN: 33 mg/dL — AB (ref 6–20)
CHLORIDE: 97 mmol/L — AB (ref 101–111)
CO2: 27 mmol/L (ref 22–32)
Calcium: 9.8 mg/dL (ref 8.9–10.3)
Creatinine, Ser: 5.04 mg/dL — ABNORMAL HIGH (ref 0.44–1.00)
GFR calc Af Amer: 13 mL/min — ABNORMAL LOW (ref >60)
GFR calc non Af Amer: 11 mL/min — ABNORMAL LOW (ref >60)
GLUCOSE: 74 mg/dL (ref 65–99)
POTASSIUM: 3.8 mmol/L (ref 3.5–5.1)
Sodium: 140 mmol/L (ref 135–145)
Total Bilirubin: 0.9 mg/dL (ref 0.3–1.2)
Total Protein: 8 g/dL (ref 6.5–8.1)

## 2015-11-05 LAB — URINE MICROSCOPIC-ADD ON

## 2015-11-05 LAB — I-STAT BETA HCG BLOOD, ED (MC, WL, AP ONLY): I-stat hCG, quantitative: <5 m[IU]/mL (ref <5)

## 2015-11-05 LAB — LIPASE, BLOOD: LIPASE: 36 U/L (ref 11–51)

## 2015-11-05 MED ORDER — ONDANSETRON HCL 4 MG/2ML IJ SOLN
4.0000 mg | Freq: Once | INTRAMUSCULAR | Status: AC
  Administered 2015-11-05: 4 mg via INTRAVENOUS
  Filled 2015-11-05: qty 2

## 2015-11-05 MED ORDER — CEFUROXIME AXETIL 250 MG PO TABS: 250.0000 mg | ORAL_TABLET | Freq: Two times a day (BID) | ORAL | Status: DC

## 2015-11-05 MED ORDER — MORPHINE SULFATE (PF) 4 MG/ML IV SOLN: 4.0000 mg | Freq: Once | INTRAVENOUS | Status: DC

## 2015-11-05 MED ORDER — DEXTROSE 5 % IV SOLN
2.0000 g | Freq: Once | INTRAVENOUS | Status: AC
  Administered 2015-11-05: 2 g via INTRAVENOUS
  Filled 2015-11-05: qty 2

## 2015-11-05 MED ORDER — ONDANSETRON 4 MG PO TBDP: 4.0000 mg | ORAL_TABLET | Freq: Three times a day (TID) | ORAL | Status: DC | PRN

## 2015-11-05 MED ORDER — HYDROMORPHONE HCL 1 MG/ML IJ SOLN
0.5000 mg | Freq: Once | INTRAMUSCULAR | Status: AC
  Administered 2015-11-05: 0.5 mg via INTRAVENOUS
  Filled 2015-11-05: qty 1

## 2015-11-05 NOTE — Discharge Instructions (Signed)
You were seen today for vomiting and dizziness. He was found to have a urinary tract infection. You were given one dose of IV antibiotics. He'll be discharged home with antibiotics. If you develop fevers, worsening pain, inability to tolerate fluids coming you need to be reevaluated immediately.  Urinary Tract Infection Urinary tract infections (UTIs) can develop anywhere along your urinary tract. Your urinary tract is your body's drainage system for removing wastes and extra water. Your urinary tract includes two kidneys, two ureters, a bladder, and a urethra. Your kidneys are a pair of bean-shaped organs. Each kidney is about the size of your fist. They are located below your ribs, one on each side of your spine. CAUSES Infections are caused by microbes, which are microscopic organisms, including fungi, viruses, and bacteria. These organisms are so small that they can only be seen through a microscope. Bacteria are the microbes that most commonly cause UTIs. SYMPTOMS  Symptoms of UTIs may vary by age and gender of the patient and by the location of the infection. Symptoms in young women typically include a frequent and intense urge to urinate and a painful, burning feeling in the bladder or urethra during urination. Older women and men are more likely to be tired, shaky, and weak and have muscle aches and abdominal pain. A fever may mean the infection is in your kidneys. Other symptoms of a kidney infection include pain in your back or sides below the ribs, nausea, and vomiting. DIAGNOSIS To diagnose a UTI, your caregiver will ask you about your symptoms. Your caregiver will also ask you to provide a urine sample. The urine sample will be tested for bacteria and white blood cells. White blood cells are made by your body to help fight infection. TREATMENT  Typically, UTIs can be treated with medication. Because most UTIs are caused by a bacterial infection, they usually can be treated with the use of  antibiotics. The choice of antibiotic and length of treatment depend on your symptoms and the type of bacteria causing your infection. HOME CARE INSTRUCTIONS  If you were prescribed antibiotics, take them exactly as your caregiver instructs you. Finish the medication even if you feel better after you have only taken some of the medication.  Drink enough water and fluids to keep your urine clear or pale yellow.  Avoid caffeine, tea, and carbonated beverages. They tend to irritate your bladder.  Empty your bladder often. Avoid holding urine for long periods of time.  Empty your bladder before and after sexual intercourse.  After a bowel movement, women should cleanse from front to back. Use each tissue only once. SEEK MEDICAL CARE IF:   You have back pain.  You develop a fever.  Your symptoms do not begin to resolve within 3 days. SEEK IMMEDIATE MEDICAL CARE IF:   You have severe back pain or lower abdominal pain.  You develop chills.  You have nausea or vomiting.  You have continued burning or discomfort with urination. MAKE SURE YOU:   Understand these instructions.  Will watch your condition.  Will get help right away if you are not doing well or get worse.   This information is not intended to replace advice given to you by your health care provider. Make sure you discuss any questions you have with your health care provider.   Document Released: 04/23/2005 Document Revised: 04/04/2015 Document Reviewed: 08/22/2011 Elsevier Interactive Patient Education [employer].

## 2015-11-05 NOTE — ED Provider Notes (Signed)
CSN: [id number]     Arrival date & time 11/04/15  2322 History  By signing my name below, I, Rowan Blase, attest that this documentation has been prepared under the direction and in the presence of Merryl Hacker, MD . Electronically Signed: Rowan Blase, Scribe. 11/05/2015. 1:28 AM.   Chief Complaint  Patient presents with  . Emesis  . Dizziness   The history is provided by the patient. No language interpreter was used.   HPI Comments:  [name] is a 25 y.o. female with PMHx of spina bifida, HTN, and end stage renal disease who presents to the Emergency Department complaining of persistent nausea and vomiting yesterday. Pt reports associated room-spinning dizziness,  diffuse back pain, and N/V for the past two days. She also notes a fall down one step after getting dizzy. No new treatments attempted yesterday; pt regularly takes Zofran for nausea and Tylenol for pain. She report h/o vomiting with UTI and notes current symptoms feel similar to prior UTI. Her last dialysis was Saturday. Pt denies diarrhea, syncope, sick contacts, shortness of breath or fever.  Past Medical History  Diagnosis Date  . Spina bifida   . UTI (lower urinary tract infection)   . HTN (hypertension) 05/02/2011  . Anemia associated with chronic renal failure   . 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  . Blood transfusion   . Dialysis care   . ESRD (end stage renal disease) on dialysis ([place])   . Depression 05/14/2015   Past Surgical History  Procedure Laterality Date  . Av fistula placement      left arm   Family History  Problem Relation Age of Onset  . Kidney cancer Other    Social History  Substance Use Topics  . Smoking status: Never Smoker   . Smokeless tobacco: None  . Alcohol Use: No   OB History    No data available     Review of Systems  Constitutional: Negative for fever.  Respiratory: Negative for shortness of breath.   Cardiovascular: Negative for  chest pain.  Gastrointestinal: Positive for nausea, vomiting and abdominal pain. Negative for diarrhea.  Musculoskeletal: Positive for back pain.  Neurological: Positive for dizziness. Negative for syncope.  All other systems reviewed and are negative.  Allergies  Ciprofloxacin; Other; Peanut-containing drug products; Aleve; Influenza vaccines; Tetanus toxoids; and Latex  Home Medications   Prior to Admission medications   Medication Sig Start Date End Date Taking? Authorizing Provider  cinacalcet (SENSIPAR) 30 MG tablet Take 30 mg by mouth daily.   Yes Historical Provider, MD  diphenhydrAMINE (BENADRYL) 25 mg capsule Take 1 capsule (25 mg total) by mouth every 6 (six) hours as needed for itching. 08/03/15  Yes Nita Sells, MD  Epoetin Alfa (EPOGEN IJ) Inject as directed. Pt gets on dialysis days which are Tuesday, Thursday, and Saturday.   Yes Historical Provider, MD  HYDROcodone-acetaminophen (NORCO/VICODIN) 5-325 MG tablet Take 2 tablets by mouth every 4 (four) hours as needed for moderate pain. 09/13/15  Yes Orpah Greek, MD  sevelamer carbonate (RENVELA) 800 MG tablet Take 800 mg by mouth 3 (three) times daily with meals.    Yes Historical Provider, MD  Vitamin D, Ergocalciferol, (DRISDOL) 50000 UNITS CAPS capsule Take 50,000 Units by mouth every 7 (seven) days. Takes on Monday.   Yes Historical Provider, MD  cefUROXime (CEFTIN) 250 MG tablet Take 1 tablet (250 mg total) by mouth 2 (two) times daily with a meal.  11/05/15   Merryl Hacker, MD  nitrofurantoin, macrocrystal-monohydrate, (MACROBID) 100 MG capsule Take 1 capsule (100 mg total) by mouth once. Patient not taking: Reported on 09/12/2015 08/03/15   Nita Sells, MD  ondansetron (ZOFRAN ODT) 4 MG disintegrating tablet Take 1 tablet (4 mg total) by mouth every 8 (eight) hours as needed for nausea or vomiting. 11/05/15   Merryl Hacker, MD  ondansetron (ZOFRAN) 4 MG tablet Take 1 tablet (4 mg total) by mouth  every 6 (six) hours. 09/13/15   Orpah Greek, MD  sertraline (ZOLOFT) 25 MG tablet Take 1 tablet (25 mg total) by mouth daily. Patient not taking: Reported on 09/12/2015 05/18/15   Hildred Priest, MD  traZODone (DESYREL) 150 MG tablet Take 1 tablet (150 mg total) by mouth at bedtime as needed for sleep. Patient not taking: Reported on 09/12/2015 05/18/15   Hildred Priest, MD   BP 104/56 mmHg  Pulse 88  Temp(Src) 99.1 F (37.3 C) (Oral)  Resp 18  SpO2 100% Physical Exam  Constitutional: She is oriented to person, place, and time. No distress.  Chronically ill-appearing, no acute distress  HENT:  Head: Normocephalic and atraumatic.  Eyes: Pupils are equal, round, and reactive to light.  Neck: Neck supple.  Cardiovascular: Normal rate, regular rhythm and normal heart sounds.   No murmur heard. Pulmonary/Chest: Effort normal and breath sounds normal. No respiratory distress. She has no wheezes.  Fistula left upper extremity, positive thrill  Abdominal: Soft. Bowel sounds are normal. There is no tenderness. There is no rebound.  Genitourinary:  No CV a tenderness  Musculoskeletal:  Congenital absence bilateral lower extremity,  kyphotic torso  Neurological: She is alert and oriented to person, place, and time.  Skin: Skin is warm and dry.  Psychiatric: She has a normal mood and affect.  Nursing note and vitals reviewed.   ED Course  Procedures  DIAGNOSTIC STUDIES: Oxygen Saturation is 98% on RA, normal by my interpretation.    COORDINATION OF CARE: 1:15 AM Will order lipase, CMP, CBC, and UA. Discussed treatment plan with pt at bedside and pt agreed to plan.  Labs Review Labs Reviewed  COMPREHENSIVE METABOLIC PANEL - Abnormal; Notable for the following:    Chloride 97 (*)    BUN 33 (*)    Creatinine, Ser 5.04 (*)    ALT 12 (*)    Alkaline Phosphatase 148 (*)    GFR calc non Af Amer 11 (*)    GFR calc Af Amer 13 (*)    Anion gap 16 (*)    All  other components within normal limits  URINALYSIS, ROUTINE W REFLEX MICROSCOPIC (NOT AT [facility]) - Abnormal; Notable for the following:    APPearance TURBID (*)    pH 8.5 (*)    Hgb urine dipstick MODERATE (*)    Protein, ur 100 (*)    Leukocytes, UA LARGE (*)    All other components within normal limits  URINE MICROSCOPIC-ADD ON - Abnormal; Notable for the following:    Squamous Epithelial / LPF 0-5 (*)    Bacteria, UA MANY (*)    All other components within normal limits  URINE CULTURE  LIPASE, BLOOD  CBC  I-STAT BETA HCG BLOOD, ED ([facility], [facility], [facility] ONLY)    Imaging Review No results found. I have personally reviewed and evaluated these images and lab results as part of my medical decision-making.   EKG Interpretation None      MDM   Final diagnoses:  UTI (  lower urinary tract infection)    Patient presents with nausea and dizziness. Also reports back discomfort. Symptoms similar to prior UTIs. No fevers. She does have back pain but no CVA tenderness. Basic labwork obtained. No leukocytosis. She is afebrile vital signs are reassuring. She was given nausea medication. Lab work is largely reassuring. No evidence of hyperkalemia. No leukocytosis. Urinalysis shows evidence of a urinary tract infection. Her last visit was in February. At that time, urine culture was not obtained but she was discharged on Ceftin without complication. She has multiple prior urine cultures that show Pseudomonas susceptible to cephalosporins and ciprofloxacin. Patient has a Cipro allergy. Patient was given 2 g of IV Rocephin. She is able tolerate fluids. Discussed with patient admission versus discharge. She is well-appearing and she does not appear septic. She was used to be discharged if at all possible. Still this is reasonable.  She was given strict return precautions.  After history, exam, and medical workup I feel the patient has been appropriately medically screened and is safe for discharge home. Pertinent  diagnoses were discussed with the patient. Patient was given return precautions.  I personally performed the services described in this documentation, which was scribed in my presence. The recorded information has been reviewed and is accurate.    Merryl Hacker, MD 11/05/15 [phone]

## 2015-11-05 NOTE — ED Notes (Signed)
Patient is alert and oriented x3.  She was given DC instructions and follow up visit instructions.  Patient gave verbal understanding. She was DC ambulatory under her own power to home.  V/S stable.  He was not showing any signs of distress on DC 

## 2015-11-06 DIAGNOSIS — N186 End stage renal disease: Secondary | ICD-10-CM | POA: Diagnosis not present

## 2015-11-06 DIAGNOSIS — D631 Anemia in chronic kidney disease: Secondary | ICD-10-CM | POA: Diagnosis not present

## 2015-11-06 DIAGNOSIS — N2581 Secondary hyperparathyroidism of renal origin: Secondary | ICD-10-CM | POA: Diagnosis not present

## 2015-11-08 DIAGNOSIS — N186 End stage renal disease: Secondary | ICD-10-CM | POA: Diagnosis not present

## 2015-11-08 DIAGNOSIS — N2581 Secondary hyperparathyroidism of renal origin: Secondary | ICD-10-CM | POA: Diagnosis not present

## 2015-11-08 DIAGNOSIS — D631 Anemia in chronic kidney disease: Secondary | ICD-10-CM | POA: Diagnosis not present

## 2015-11-10 ENCOUNTER — Encounter (HOSPITAL_COMMUNITY): Payer: Self-pay | Admitting: Emergency Medicine

## 2015-11-10 DIAGNOSIS — N2581 Secondary hyperparathyroidism of renal origin: Secondary | ICD-10-CM | POA: Diagnosis not present

## 2015-11-10 DIAGNOSIS — D631 Anemia in chronic kidney disease: Secondary | ICD-10-CM | POA: Diagnosis not present

## 2015-11-10 DIAGNOSIS — N186 End stage renal disease: Secondary | ICD-10-CM | POA: Diagnosis not present

## 2015-11-13 DIAGNOSIS — D631 Anemia in chronic kidney disease: Secondary | ICD-10-CM | POA: Diagnosis not present

## 2015-11-13 DIAGNOSIS — N186 End stage renal disease: Secondary | ICD-10-CM | POA: Diagnosis not present

## 2015-11-13 DIAGNOSIS — N2581 Secondary hyperparathyroidism of renal origin: Secondary | ICD-10-CM | POA: Diagnosis not present

## 2015-11-13 LAB — URINE CULTURE: Culture: >=100000 — AB

## 2015-11-14 ENCOUNTER — Telehealth (HOSPITAL_BASED_OUTPATIENT_CLINIC_OR_DEPARTMENT_OTHER): Payer: Self-pay | Admitting: Emergency Medicine

## 2015-11-14 NOTE — Telephone Encounter (Signed)
Post ED Visit - Positive Culture Follow-up: Successful Patient Follow-Up  Culture assessed and recommendations reviewed by: []  Elenor Quinones, Pharm.D. []  Heide Guile, Pharm.D., BCPS []  Parks Neptune, Pharm.D. []  Alycia Rossetti, Pharm.D., BCPS []  [place], Pharm.D., BCPS, AAHIVP []  Legrand Como, Pharm.D., BCPS, AAHIVP []  Milus Glazier, Pharm.D. []  Stephens November, [place].Raul Del PharmD  Positive urine culture  []  Patient discharged without antimicrobial prescription and treatment is now indicated [x]  Organism is resistant to prescribed ED discharge antimicrobial []  Patient with positive blood cultures  Changes discussed with ED provider: Christy Gentles New antibiotic prescription symptom check, if + needs to come in for IV antibioitics  Attempting to reach patient for symptom check, change in treatment   Hazle Nordmann 11/14/2015, 3:30 PM

## 2015-11-14 NOTE — Progress Notes (Signed)
ED Antimicrobial Stewardship Positive Culture Follow Up   [name] is an 25 y.o. female who presented to [facility] on 11/04/2015 with a chief complaint of  Chief Complaint  Patient presents with  . Emesis  . Dizziness    Recent Results (from the past 720 hour(s))  Urine culture     Status: Abnormal   Collection Time: 11/05/15  1:03 AM  Result Value Ref Range Status   Specimen Description URINE, CATHETERIZED  Final   Special Requests NONE  Final   Culture (A)  Final    >=100,000 COLONIES/mL PSEUDOMONAS AERUGINOSA SEE SEPARATE REPORT FOR SUSCEPTABILITIES Performed at [facility]    Report Status 11/13/2015 FINAL  Final    [x]  Treated with Cefuroxime Axetil, organism resistant to prescribed antimicrobial []  Patient discharged originally without antimicrobial agent and treatment is now indicated  Patient is allergic to Ciprofloxacin and needs treatment, therefore, needs to return to hospital for IV antibiotic treatment.   ED Provider: Ripley Fraise, MD   Liliane Shi 11/14/2015, 10:38 AM PharmD Candidate

## 2015-11-15 DIAGNOSIS — D631 Anemia in chronic kidney disease: Secondary | ICD-10-CM | POA: Diagnosis not present

## 2015-11-15 DIAGNOSIS — N2581 Secondary hyperparathyroidism of renal origin: Secondary | ICD-10-CM | POA: Diagnosis not present

## 2015-11-15 DIAGNOSIS — N186 End stage renal disease: Secondary | ICD-10-CM | POA: Diagnosis not present

## 2015-11-17 DIAGNOSIS — N2581 Secondary hyperparathyroidism of renal origin: Secondary | ICD-10-CM | POA: Diagnosis not present

## 2015-11-17 DIAGNOSIS — N186 End stage renal disease: Secondary | ICD-10-CM | POA: Diagnosis not present

## 2015-11-17 DIAGNOSIS — D631 Anemia in chronic kidney disease: Secondary | ICD-10-CM | POA: Diagnosis not present

## 2015-11-20 DIAGNOSIS — N2581 Secondary hyperparathyroidism of renal origin: Secondary | ICD-10-CM | POA: Diagnosis not present

## 2015-11-20 DIAGNOSIS — N186 End stage renal disease: Secondary | ICD-10-CM | POA: Diagnosis not present

## 2015-11-20 DIAGNOSIS — D631 Anemia in chronic kidney disease: Secondary | ICD-10-CM | POA: Diagnosis not present

## 2015-11-24 DIAGNOSIS — D631 Anemia in chronic kidney disease: Secondary | ICD-10-CM | POA: Diagnosis not present

## 2015-11-24 DIAGNOSIS — N186 End stage renal disease: Secondary | ICD-10-CM | POA: Diagnosis not present

## 2015-11-24 DIAGNOSIS — N2581 Secondary hyperparathyroidism of renal origin: Secondary | ICD-10-CM | POA: Diagnosis not present

## 2015-11-25 DIAGNOSIS — N186 End stage renal disease: Secondary | ICD-10-CM | POA: Diagnosis not present

## 2015-11-25 DIAGNOSIS — Z992 Dependence on renal dialysis: Secondary | ICD-10-CM | POA: Diagnosis not present

## 2015-11-25 DIAGNOSIS — I120 Hypertensive chronic kidney disease with stage 5 chronic kidney disease or end stage renal disease: Secondary | ICD-10-CM | POA: Diagnosis not present

## 2015-11-27 DIAGNOSIS — D631 Anemia in chronic kidney disease: Secondary | ICD-10-CM | POA: Diagnosis not present

## 2015-11-27 DIAGNOSIS — N2581 Secondary hyperparathyroidism of renal origin: Secondary | ICD-10-CM | POA: Diagnosis not present

## 2015-11-27 DIAGNOSIS — N390 Urinary tract infection, site not specified: Secondary | ICD-10-CM | POA: Diagnosis not present

## 2015-11-27 DIAGNOSIS — N186 End stage renal disease: Secondary | ICD-10-CM | POA: Diagnosis not present

## 2015-11-29 ENCOUNTER — Encounter (HOSPITAL_COMMUNITY): Payer: Self-pay | Admitting: *Deleted

## 2015-11-29 ENCOUNTER — Emergency Department (HOSPITAL_COMMUNITY)
Admission: EM | Admit: 2015-11-29 | Discharge: 2015-11-29 | Disposition: A | Discharge status: Home / Self Care | Payer: Medicare Other | Attending: Emergency Medicine | Admitting: Emergency Medicine

## 2015-11-29 DIAGNOSIS — Z91040 Latex allergy status: Secondary | ICD-10-CM | POA: Insufficient documentation

## 2015-11-29 DIAGNOSIS — Z8659 Personal history of other mental and behavioral disorders: Secondary | ICD-10-CM | POA: Insufficient documentation

## 2015-11-29 DIAGNOSIS — M62562 Muscle wasting and atrophy, not elsewhere classified, left lower leg: Secondary | ICD-10-CM | POA: Diagnosis not present

## 2015-11-29 DIAGNOSIS — Z862 Personal history of diseases of the blood and blood-forming organs and certain disorders involving the immune mechanism: Secondary | ICD-10-CM | POA: Insufficient documentation

## 2015-11-29 DIAGNOSIS — M62561 Muscle wasting and atrophy, not elsewhere classified, right lower leg: Secondary | ICD-10-CM | POA: Insufficient documentation

## 2015-11-29 DIAGNOSIS — R1084 Generalized abdominal pain: Secondary | ICD-10-CM

## 2015-11-29 DIAGNOSIS — Z992 Dependence on renal dialysis: Secondary | ICD-10-CM | POA: Diagnosis not present

## 2015-11-29 DIAGNOSIS — Q059 Spina bifida, unspecified: Secondary | ICD-10-CM | POA: Diagnosis not present

## 2015-11-29 DIAGNOSIS — R109 Unspecified abdominal pain: Secondary | ICD-10-CM | POA: Insufficient documentation

## 2015-11-29 DIAGNOSIS — Z3202 Encounter for pregnancy test, result negative: Secondary | ICD-10-CM | POA: Insufficient documentation

## 2015-11-29 DIAGNOSIS — N186 End stage renal disease: Secondary | ICD-10-CM | POA: Diagnosis not present

## 2015-11-29 DIAGNOSIS — I120 Hypertensive chronic kidney disease with stage 5 chronic kidney disease or end stage renal disease: Secondary | ICD-10-CM | POA: Diagnosis not present

## 2015-11-29 DIAGNOSIS — D631 Anemia in chronic kidney disease: Secondary | ICD-10-CM | POA: Diagnosis not present

## 2015-11-29 DIAGNOSIS — Z79899 Other long term (current) drug therapy: Secondary | ICD-10-CM | POA: Insufficient documentation

## 2015-11-29 DIAGNOSIS — N2581 Secondary hyperparathyroidism of renal origin: Secondary | ICD-10-CM | POA: Diagnosis not present

## 2015-11-29 DIAGNOSIS — N390 Urinary tract infection, site not specified: Secondary | ICD-10-CM | POA: Diagnosis not present

## 2015-11-29 LAB — URINALYSIS, ROUTINE W REFLEX MICROSCOPIC
Bilirubin Urine: NEGATIVE
Glucose, UA: NEGATIVE mg/dL
Ketones, ur: NEGATIVE mg/dL
Nitrite: POSITIVE — AB
Protein, ur: 100 mg/dL — AB
Specific Gravity, Urine: 1.006 (ref 1.005–1.030)
pH: 8.5 — ABNORMAL HIGH (ref 5.0–8.0)

## 2015-11-29 LAB — COMPREHENSIVE METABOLIC PANEL
ALT: 13 U/L — ABNORMAL LOW (ref 14–54)
AST: 20 U/L (ref 15–41)
Albumin: 3.4 g/dL — ABNORMAL LOW (ref 3.5–5.0)
Alkaline Phosphatase: 118 U/L (ref 38–126)
Anion gap: 11 (ref 5–15)
BUN: 22 mg/dL — ABNORMAL HIGH (ref 6–20)
CO2: 29 mmol/L (ref 22–32)
Calcium: 8.5 mg/dL — ABNORMAL LOW (ref 8.9–10.3)
Chloride: 101 mmol/L (ref 101–111)
Creatinine, Ser: 2.8 mg/dL — ABNORMAL HIGH (ref 0.44–1.00)
GFR calc Af Amer: 26 mL/min — ABNORMAL LOW (ref >60)
GFR calc non Af Amer: 23 mL/min — ABNORMAL LOW (ref >60)
Glucose, Bld: 86 mg/dL (ref 65–99)
Potassium: 3.5 mmol/L (ref 3.5–5.1)
Sodium: 141 mmol/L (ref 135–145)
Total Bilirubin: 0.5 mg/dL (ref 0.3–1.2)
Total Protein: 7 g/dL (ref 6.5–8.1)

## 2015-11-29 LAB — I-STAT BETA HCG BLOOD, ED (MC, WL, AP ONLY): I-stat hCG, quantitative: <5 m[IU]/mL (ref <5)

## 2015-11-29 LAB — CBC
HCT: 34.1 % — ABNORMAL LOW (ref 36.0–46.0)
Hemoglobin: 10.6 g/dL — ABNORMAL LOW (ref 12.0–15.0)
MCH: 28.6 pg (ref 26.0–34.0)
MCHC: 31.1 g/dL (ref 30.0–36.0)
MCV: 92.2 fL (ref 78.0–100.0)
Platelets: 163 10*3/uL (ref 150–400)
RBC: 3.7 MIL/uL — ABNORMAL LOW (ref 3.87–5.11)
RDW: 14.3 % (ref 11.5–15.5)
WBC: 5 10*3/uL (ref 4.0–10.5)

## 2015-11-29 LAB — URINE MICROSCOPIC-ADD ON

## 2015-11-29 LAB — LIPASE, BLOOD: Lipase: 194 U/L — ABNORMAL HIGH (ref 11–51)

## 2015-11-29 MED ORDER — HYDROCODONE-ACETAMINOPHEN 5-325 MG PO TABS: 1.0000 | ORAL_TABLET | ORAL | Status: DC | PRN

## 2015-11-29 MED ORDER — DIPHENHYDRAMINE HCL 25 MG PO CAPS
25.0000 mg | ORAL_CAPSULE | Freq: Once | ORAL | Status: AC
  Administered 2015-11-29: 25 mg via ORAL
  Filled 2015-11-29: qty 1

## 2015-11-29 MED ORDER — MORPHINE SULFATE (PF) 4 MG/ML IV SOLN
4.0000 mg | Freq: Once | INTRAVENOUS | Status: DC
  Filled 2015-11-29: qty 1

## 2015-11-29 MED ORDER — METOCLOPRAMIDE HCL 5 MG/ML IJ SOLN
10.0000 mg | Freq: Once | INTRAMUSCULAR | Status: AC
  Administered 2015-11-29: 10 mg via INTRAVENOUS
  Filled 2015-11-29: qty 2

## 2015-11-29 MED ORDER — HYDROMORPHONE HCL 1 MG/ML IJ SOLN
1.0000 mg | Freq: Once | INTRAMUSCULAR | Status: AC
  Administered 2015-11-29: 1 mg via INTRAVENOUS
  Filled 2015-11-29: qty 1

## 2015-11-29 MED ORDER — ONDANSETRON HCL 4 MG/2ML IJ SOLN
4.0000 mg | Freq: Once | INTRAMUSCULAR | Status: AC | PRN
  Administered 2015-11-29: 4 mg via INTRAVENOUS
  Filled 2015-11-29: qty 2

## 2015-11-29 MED ORDER — ONDANSETRON HCL 4 MG/2ML IJ SOLN
4.0000 mg | Freq: Once | INTRAMUSCULAR | Status: DC
  Filled 2015-11-29: qty 2

## 2015-11-29 MED ORDER — DEXTROSE 5 % IV SOLN
2.0000 g | INTRAVENOUS | Status: AC
  Administered 2015-11-29: 2 g via INTRAVENOUS
  Filled 2015-11-29: qty 2

## 2015-11-29 MED ORDER — METOCLOPRAMIDE HCL 10 MG PO TABS: 10.0000 mg | ORAL_TABLET | Freq: Three times a day (TID) | ORAL | Status: DC | PRN

## 2015-11-29 MED ORDER — HYDROMORPHONE HCL 1 MG/ML IJ SOLN
0.5000 mg | Freq: Once | INTRAMUSCULAR | Status: AC
  Administered 2015-11-29: 0.5 mg via INTRAVENOUS
  Filled 2015-11-29: qty 1

## 2015-11-29 MED ORDER — SODIUM CHLORIDE 0.9 % IV BOLUS (SEPSIS)
500.0000 mL | Freq: Once | INTRAVENOUS | Status: AC
  Administered 2015-11-29: 500 mL via INTRAVENOUS

## 2015-11-29 MED ORDER — CEFEPIME HCL 2 G IJ SOLR: 2.0000 g | INTRAMUSCULAR | Status: DC

## 2015-11-29 MED ORDER — ONDANSETRON HCL 4 MG/2ML IJ SOLN
4.0000 mg | Freq: Once | INTRAMUSCULAR | Status: AC
  Administered 2015-11-29: 4 mg via INTRAVENOUS
  Filled 2015-11-29: qty 2

## 2015-11-29 NOTE — Discharge Instructions (Signed)
Read the information below.  Use the prescribed medication as directed.  Please discuss all new medications with your pharmacist.  Do not take additional tylenol while taking the prescribed pain medication to avoid overdose.  You may return to the Emergency Department at any time for worsening condition or any new symptoms that concern you.    If you develop high fevers, worsening abdominal pain, uncontrolled vomiting, or are unable to tolerate fluids by mouth, return to the ER for a recheck.    Abdominal Pain, Adult Many things can cause abdominal pain. Usually, abdominal pain is not caused by a disease and will improve without treatment. It can often be observed and treated at home. Your health care provider will do a physical exam and possibly order blood tests and X-rays to help determine the seriousness of your pain. However, in many cases, more time must pass before a clear cause of the pain can be found. Before that point, your health care provider may not know if you need more testing or further treatment. HOME CARE INSTRUCTIONS Monitor your abdominal pain for any changes. The following actions may help to alleviate any discomfort you are experiencing:  Only take over-the-counter or prescription medicines as directed by your health care provider.  Do not take laxatives unless directed to do so by your health care provider.  Try a clear liquid diet (broth, tea, or water) as directed by your health care provider. Slowly move to a bland diet as tolerated. SEEK MEDICAL CARE IF:  You have unexplained abdominal pain.  You have abdominal pain associated with nausea or diarrhea.  You have pain when you urinate or have a bowel movement.  You experience abdominal pain that wakes you in the night.  You have abdominal pain that is worsened or improved by eating food.  You have abdominal pain that is worsened with eating fatty foods.  You have a fever. SEEK IMMEDIATE MEDICAL CARE IF:  Your  pain does not go away within 2 hours.  You keep throwing up (vomiting).  Your pain is felt only in portions of the abdomen, such as the right side or the left lower portion of the abdomen.  You pass bloody or black tarry stools. MAKE SURE YOU:  Understand these instructions.  Will watch your condition.  Will get help right away if you are not doing well or get worse.   This information is not intended to replace advice given to you by your health care provider. Make sure you discuss any questions you have with your health care provider.   Document Released: 04/23/2005 Document Revised: 04/04/2015 Document Reviewed: 03/23/2013 Elsevier Interactive Patient Education 2016 Elsevier Inc.  Urinary Tract Infection Urinary tract infections (UTIs) can develop anywhere along your urinary tract. Your urinary tract is your body's drainage system for removing wastes and extra water. Your urinary tract includes two kidneys, two ureters, a bladder, and a urethra. Your kidneys are a pair of bean-shaped organs. Each kidney is about the size of your fist. They are located below your ribs, one on each side of your spine. CAUSES Infections are caused by microbes, which are microscopic organisms, including fungi, viruses, and bacteria. These organisms are so small that they can only be seen through a microscope. Bacteria are the microbes that most commonly cause UTIs. SYMPTOMS  Symptoms of UTIs may vary by age and gender of the patient and by the location of the infection. Symptoms in young women typically include a frequent and intense urge  to urinate and a painful, burning feeling in the bladder or urethra during urination. Older women and men are more likely to be tired, shaky, and weak and have muscle aches and abdominal pain. A fever may mean the infection is in your kidneys. Other symptoms of a kidney infection include pain in your back or sides below the ribs, nausea, and vomiting. DIAGNOSIS To  diagnose a UTI, your caregiver will ask you about your symptoms. Your caregiver will also ask you to provide a urine sample. The urine sample will be tested for bacteria and white blood cells. White blood cells are made by your body to help fight infection. TREATMENT  Typically, UTIs can be treated with medication. Because most UTIs are caused by a bacterial infection, they usually can be treated with the use of antibiotics. The choice of antibiotic and length of treatment depend on your symptoms and the type of bacteria causing your infection. HOME CARE INSTRUCTIONS  If you were prescribed antibiotics, take them exactly as your caregiver instructs you. Finish the medication even if you feel better after you have only taken some of the medication.  Drink enough water and fluids to keep your urine clear or pale yellow.  Avoid caffeine, tea, and carbonated beverages. They tend to irritate your bladder.  Empty your bladder often. Avoid holding urine for long periods of time.  Empty your bladder before and after sexual intercourse.  After a bowel movement, women should cleanse from front to back. Use each tissue only once. SEEK MEDICAL CARE IF:   You have back pain.  You develop a fever.  Your symptoms do not begin to resolve within 3 days. SEEK IMMEDIATE MEDICAL CARE IF:   You have severe back pain or lower abdominal pain.  You develop chills.  You have nausea or vomiting.  You have continued burning or discomfort with urination. MAKE SURE YOU:   Understand these instructions.  Will watch your condition.  Will get help right away if you are not doing well or get worse.   This information is not intended to replace advice given to you by your health care provider. Make sure you discuss any questions you have with your health care provider.   Document Released: 04/23/2005 Document Revised: 04/04/2015 Document Reviewed: 08/22/2011 Elsevier Interactive Patient Education [employer]

## 2015-11-29 NOTE — ED Notes (Signed)
Patient verbalized understanding of discharge instructions and denies any further needs or questions at this time. VS stable. Assisted to ED entrance in wheelchair where she left with her mom in car.

## 2015-11-29 NOTE — ED Notes (Signed)
PT reports LT sided ABD pain that started last night. Pt reports ABD pain worse after starting Dialysis this AM.

## 2015-11-29 NOTE — ED Provider Notes (Signed)
CSN: [id number]     Arrival date & time 11/29/15  1012 History  By signing my name below, I, Irene Pap, attest that this documentation has been prepared under the direction and in the presence of Meleena Munroe, PA-C. Electronically Signed: Irene Pap, ED Scribe. 11/29/2015. 2:19 PM.   Chief Complaint  Patient presents with  . Abdominal Pain   The history is provided by the patient. No language interpreter was used.  HPI Comments: [name] is a 25 y.o. female with a hx of spina bifida, UTI, ESRD on dialysis, and caudal regression syndrome who presents to the Emergency Department complaining of gradually worsening, sharp, intermittent, left sided abdominal pain underneath the ribs onset one day ago. She states that her pain is 10/10 at its worst. She reports worsening pain this morning after receiving dialysis. She states that she received most of her dialysis this session. She regularly has dialysis on Tuesdays, Thursdays, and Saturdays. She states that pain episodes last about a minute at a time. Pt was recently diagnosed with a UTI for which she finished her antibiotic. She has not tried anything for her pain. She denies worsening or alleviating factors. She states that this pain is different from her typical UTI pain. Pt denies hx of abdominal surgeries, appetite change, new nausea, fever, chills, cough, SOB, vomiting, diarrhea, constipation, hematochezia, back pain, generalized myalgias, vaginal bleeding, vaginal discharge, dysuria, or hematuria.    Past Medical History  Diagnosis Date  . Spina bifida   . UTI (lower urinary tract infection)   . HTN (hypertension) 05/02/2011  . Anemia associated with chronic renal failure   . 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  . Blood transfusion   . Dialysis care   . ESRD (end stage renal disease) on dialysis ([place])   . Depression 05/14/2015   Past Surgical History  Procedure Laterality Date  . Av fistula placement       left arm   Family History  Problem Relation Age of Onset  . Kidney cancer Other    Social History  Substance Use Topics  . Smoking status: Never Smoker   . Smokeless tobacco: None  . Alcohol Use: No   OB History    No data available     Review of Systems  Constitutional: Negative for fever, chills and appetite change.  Respiratory: Negative for cough and shortness of breath.   Cardiovascular: Negative for chest pain.  Gastrointestinal: Positive for abdominal pain. Negative for nausea, vomiting, constipation and blood in stool.  Genitourinary: Negative for dysuria, hematuria, vaginal bleeding and vaginal discharge.  Musculoskeletal: Negative for myalgias and back pain.    Allergies  Ciprofloxacin; Other; Peanut-containing drug products; Aleve; Influenza vaccines; Tetanus toxoids; and Latex  Home Medications   Prior to Admission medications   Medication Sig Start Date End Date Taking? Authorizing Provider  cinacalcet (SENSIPAR) 30 MG tablet Take 30 mg by mouth daily.   Yes Historical Provider, MD  diphenhydrAMINE (BENADRYL) 25 mg capsule Take 1 capsule (25 mg total) by mouth every 6 (six) hours as needed for itching. 08/03/15  Yes Nita Sells, MD  Epoetin Alfa (EPOGEN IJ) Inject as directed. Pt gets on dialysis days which are Tuesday, Thursday, and Saturday.   Yes Historical Provider, MD  ondansetron (ZOFRAN ODT) 4 MG disintegrating tablet Take 1 tablet (4 mg total) by mouth every 8 (eight) hours as needed for nausea or vomiting. 11/05/15  Yes Merryl Hacker, MD  sevelamer carbonate (  RENVELA) 800 MG tablet Take 800 mg by mouth 3 (three) times daily with meals.    Yes Historical Provider, MD  Vitamin D, Ergocalciferol, (DRISDOL) 50000 UNITS CAPS capsule Take 50,000 Units by mouth every 7 (seven) days. Takes on Monday.   Yes Historical Provider, MD   BP 150/93 mmHg  Pulse 90  Temp(Src) 98.3 F (36.8 C) (Oral)  Resp 20  SpO2 100% Physical Exam  Constitutional: She  appears well-developed and well-nourished. No distress.  HENT:  Head: Normocephalic and atraumatic.  Neck: Neck supple.  Cardiovascular: Normal rate and regular rhythm.   Pulmonary/Chest: Effort normal and breath sounds normal. No respiratory distress. She has no wheezes. She has no rales.  Abdominal: Soft. She exhibits no distension. There is tenderness. There is no rebound and no guarding.  Diffuse abdominal tenderness  Musculoskeletal:  Chronic atrophy of the lower extremities  Neurological: She is alert.  Skin: She is not diaphoretic.  Nursing note and vitals reviewed.   ED Course  Procedures (including critical care time) DIAGNOSTIC STUDIES: Oxygen Saturation is 100% on RA, normal by my interpretation.    COORDINATION OF CARE: 2:17 PM-Discussed treatment plan which includes labs with pt at bedside and pt agreed to plan.    Labs Review Labs Reviewed  LIPASE, BLOOD - Abnormal; Notable for the following:    Lipase 194 (*)    All other components within normal limits  COMPREHENSIVE METABOLIC PANEL - Abnormal; Notable for the following:    BUN 22 (*)    Creatinine, Ser 2.80 (*)    Calcium 8.5 (*)    Albumin 3.4 (*)    ALT 13 (*)    GFR calc non Af Amer 23 (*)    GFR calc Af Amer 26 (*)    All other components within normal limits  CBC - Abnormal; Notable for the following:    RBC 3.70 (*)    Hemoglobin 10.6 (*)    HCT 34.1 (*)    All other components within normal limits  URINALYSIS, ROUTINE W REFLEX MICROSCOPIC (NOT AT [facility]) - Abnormal; Notable for the following:    APPearance CLOUDY (*)    pH 8.5 (*)    Hgb urine dipstick SMALL (*)    Protein, ur 100 (*)    Nitrite POSITIVE (*)    Leukocytes, UA LARGE (*)    All other components within normal limits  URINE MICROSCOPIC-ADD ON - Abnormal; Notable for the following:    Squamous Epithelial / LPF 0-5 (*)    Bacteria, UA MANY (*)    All other components within normal limits  I-STAT BETA HCG BLOOD, ED ([facility], [facility], [facility]  ONLY)    Imaging Review No results found. I have personally reviewed and evaluated these images and lab results as part of my medical decision-making.   EKG Interpretation None       2:49 PM I discussed culture report with ED pharmacist Apolonio Schneiders).  Will give cefepime 2g today and cefepime 2g following dialysis for the next two sessions based on recent culture report 10/2015.    MDM   Final diagnoses:  Generalized abdominal pain  UTI (lower urinary tract infection)    Afebrile nontoxic patient with hx spina bifida and ESRD on dialysis c/o generalized abdominal pain.  UA infected.  Pt had UTI and was treated after ED visit last month but grew resistant pseudomonas and pt was not reached in follow up.  Labs approximately at baseline, slightly elevated lipase though I do not think  pt has pancreatitis.  Nonfocal, nonsurgical abdominal exam.  Please see discussion with pharmacist above in ED course.  Pt given dose of cefepime in ED and d/c home with plan to have cefepime given at dialysis center following dialysis for the next two sessions.  Discussed result, findings, treatment, and follow up  with patient and mother.  Pt given return precautions.  Pt verbalizes understanding and agrees with plan.      I personally performed the services described in this documentation, which was scribed in my presence. The recorded information has been reviewed and is accurate.    Clayton Bibles, PA-C 11/29/15 1836  Virgel Manifold, MD 12/06/15 1200

## 2015-11-29 NOTE — ED Notes (Signed)
Pt graft dry ,there is no bleeding from graft site . Dsy. Applied to site.

## 2015-11-29 NOTE — ED Notes (Signed)
IV team at PT bed side remove dialysis acces in Lt arm. ENT ,Josh at bed side to apply pressure to site to prevent bleeding.

## 2015-12-01 DIAGNOSIS — N390 Urinary tract infection, site not specified: Secondary | ICD-10-CM | POA: Diagnosis not present

## 2015-12-01 DIAGNOSIS — D631 Anemia in chronic kidney disease: Secondary | ICD-10-CM | POA: Diagnosis not present

## 2015-12-01 DIAGNOSIS — N2581 Secondary hyperparathyroidism of renal origin: Secondary | ICD-10-CM | POA: Diagnosis not present

## 2015-12-01 DIAGNOSIS — N186 End stage renal disease: Secondary | ICD-10-CM | POA: Diagnosis not present

## 2015-12-04 DIAGNOSIS — N2581 Secondary hyperparathyroidism of renal origin: Secondary | ICD-10-CM | POA: Diagnosis not present

## 2015-12-04 DIAGNOSIS — N186 End stage renal disease: Secondary | ICD-10-CM | POA: Diagnosis not present

## 2015-12-04 DIAGNOSIS — D631 Anemia in chronic kidney disease: Secondary | ICD-10-CM | POA: Diagnosis not present

## 2015-12-04 DIAGNOSIS — N390 Urinary tract infection, site not specified: Secondary | ICD-10-CM | POA: Diagnosis not present

## 2015-12-05 DIAGNOSIS — R51 Headache: Secondary | ICD-10-CM | POA: Diagnosis not present

## 2015-12-06 DIAGNOSIS — N186 End stage renal disease: Secondary | ICD-10-CM | POA: Diagnosis not present

## 2015-12-06 DIAGNOSIS — N2581 Secondary hyperparathyroidism of renal origin: Secondary | ICD-10-CM | POA: Diagnosis not present

## 2015-12-06 DIAGNOSIS — N390 Urinary tract infection, site not specified: Secondary | ICD-10-CM | POA: Diagnosis not present

## 2015-12-06 DIAGNOSIS — D631 Anemia in chronic kidney disease: Secondary | ICD-10-CM | POA: Diagnosis not present

## 2015-12-08 DIAGNOSIS — N186 End stage renal disease: Secondary | ICD-10-CM | POA: Diagnosis not present

## 2015-12-08 DIAGNOSIS — N390 Urinary tract infection, site not specified: Secondary | ICD-10-CM | POA: Diagnosis not present

## 2015-12-08 DIAGNOSIS — D631 Anemia in chronic kidney disease: Secondary | ICD-10-CM | POA: Diagnosis not present

## 2015-12-08 DIAGNOSIS — N2581 Secondary hyperparathyroidism of renal origin: Secondary | ICD-10-CM | POA: Diagnosis not present

## 2015-12-11 DIAGNOSIS — D631 Anemia in chronic kidney disease: Secondary | ICD-10-CM | POA: Diagnosis not present

## 2015-12-11 DIAGNOSIS — N390 Urinary tract infection, site not specified: Secondary | ICD-10-CM | POA: Diagnosis not present

## 2015-12-11 DIAGNOSIS — N186 End stage renal disease: Secondary | ICD-10-CM | POA: Diagnosis not present

## 2015-12-11 DIAGNOSIS — N2581 Secondary hyperparathyroidism of renal origin: Secondary | ICD-10-CM | POA: Diagnosis not present

## 2015-12-13 DIAGNOSIS — N2581 Secondary hyperparathyroidism of renal origin: Secondary | ICD-10-CM | POA: Diagnosis not present

## 2015-12-13 DIAGNOSIS — N390 Urinary tract infection, site not specified: Secondary | ICD-10-CM | POA: Diagnosis not present

## 2015-12-13 DIAGNOSIS — N186 End stage renal disease: Secondary | ICD-10-CM | POA: Diagnosis not present

## 2015-12-13 DIAGNOSIS — D631 Anemia in chronic kidney disease: Secondary | ICD-10-CM | POA: Diagnosis not present

## 2015-12-15 DIAGNOSIS — N390 Urinary tract infection, site not specified: Secondary | ICD-10-CM | POA: Diagnosis not present

## 2015-12-15 DIAGNOSIS — D631 Anemia in chronic kidney disease: Secondary | ICD-10-CM | POA: Diagnosis not present

## 2015-12-15 DIAGNOSIS — N186 End stage renal disease: Secondary | ICD-10-CM | POA: Diagnosis not present

## 2015-12-15 DIAGNOSIS — N2581 Secondary hyperparathyroidism of renal origin: Secondary | ICD-10-CM | POA: Diagnosis not present

## 2015-12-18 DIAGNOSIS — N2581 Secondary hyperparathyroidism of renal origin: Secondary | ICD-10-CM | POA: Diagnosis not present

## 2015-12-18 DIAGNOSIS — N186 End stage renal disease: Secondary | ICD-10-CM | POA: Diagnosis not present

## 2015-12-18 DIAGNOSIS — D631 Anemia in chronic kidney disease: Secondary | ICD-10-CM | POA: Diagnosis not present

## 2015-12-18 DIAGNOSIS — N390 Urinary tract infection, site not specified: Secondary | ICD-10-CM | POA: Diagnosis not present

## 2015-12-20 DIAGNOSIS — N2581 Secondary hyperparathyroidism of renal origin: Secondary | ICD-10-CM | POA: Diagnosis not present

## 2015-12-20 DIAGNOSIS — N390 Urinary tract infection, site not specified: Secondary | ICD-10-CM | POA: Diagnosis not present

## 2015-12-20 DIAGNOSIS — D631 Anemia in chronic kidney disease: Secondary | ICD-10-CM | POA: Diagnosis not present

## 2015-12-20 DIAGNOSIS — N186 End stage renal disease: Secondary | ICD-10-CM | POA: Diagnosis not present

## 2015-12-22 DIAGNOSIS — N186 End stage renal disease: Secondary | ICD-10-CM | POA: Diagnosis not present

## 2015-12-22 DIAGNOSIS — N2581 Secondary hyperparathyroidism of renal origin: Secondary | ICD-10-CM | POA: Diagnosis not present

## 2015-12-22 DIAGNOSIS — N390 Urinary tract infection, site not specified: Secondary | ICD-10-CM | POA: Diagnosis not present

## 2015-12-22 DIAGNOSIS — D631 Anemia in chronic kidney disease: Secondary | ICD-10-CM | POA: Diagnosis not present

## 2015-12-25 DIAGNOSIS — N390 Urinary tract infection, site not specified: Secondary | ICD-10-CM | POA: Diagnosis not present

## 2015-12-25 DIAGNOSIS — D631 Anemia in chronic kidney disease: Secondary | ICD-10-CM | POA: Diagnosis not present

## 2015-12-25 DIAGNOSIS — N2581 Secondary hyperparathyroidism of renal origin: Secondary | ICD-10-CM | POA: Diagnosis not present

## 2015-12-25 DIAGNOSIS — N186 End stage renal disease: Secondary | ICD-10-CM | POA: Diagnosis not present

## 2015-12-26 DIAGNOSIS — Z992 Dependence on renal dialysis: Secondary | ICD-10-CM | POA: Diagnosis not present

## 2015-12-26 DIAGNOSIS — N186 End stage renal disease: Secondary | ICD-10-CM | POA: Diagnosis not present

## 2015-12-26 DIAGNOSIS — I120 Hypertensive chronic kidney disease with stage 5 chronic kidney disease or end stage renal disease: Secondary | ICD-10-CM | POA: Diagnosis not present

## 2015-12-27 DIAGNOSIS — N2581 Secondary hyperparathyroidism of renal origin: Secondary | ICD-10-CM | POA: Diagnosis not present

## 2015-12-27 DIAGNOSIS — N390 Urinary tract infection, site not specified: Secondary | ICD-10-CM | POA: Diagnosis not present

## 2015-12-27 DIAGNOSIS — D631 Anemia in chronic kidney disease: Secondary | ICD-10-CM | POA: Diagnosis not present

## 2015-12-27 DIAGNOSIS — N186 End stage renal disease: Secondary | ICD-10-CM | POA: Diagnosis not present

## 2015-12-29 DIAGNOSIS — D631 Anemia in chronic kidney disease: Secondary | ICD-10-CM | POA: Diagnosis not present

## 2015-12-29 DIAGNOSIS — N186 End stage renal disease: Secondary | ICD-10-CM | POA: Diagnosis not present

## 2015-12-29 DIAGNOSIS — N390 Urinary tract infection, site not specified: Secondary | ICD-10-CM | POA: Diagnosis not present

## 2015-12-29 DIAGNOSIS — N2581 Secondary hyperparathyroidism of renal origin: Secondary | ICD-10-CM | POA: Diagnosis not present

## 2016-01-01 ENCOUNTER — Emergency Department (HOSPITAL_COMMUNITY)
Admission: EM | Admit: 2016-01-01 | Discharge: 2016-01-01 | Disposition: A | Discharge status: Home / Self Care | Payer: Medicare Other | Attending: Emergency Medicine | Admitting: Emergency Medicine

## 2016-01-01 ENCOUNTER — Encounter (HOSPITAL_COMMUNITY): Payer: Self-pay

## 2016-01-01 DIAGNOSIS — I120 Hypertensive chronic kidney disease with stage 5 chronic kidney disease or end stage renal disease: Secondary | ICD-10-CM | POA: Diagnosis not present

## 2016-01-01 DIAGNOSIS — N186 End stage renal disease: Secondary | ICD-10-CM | POA: Diagnosis not present

## 2016-01-01 DIAGNOSIS — Z79899 Other long term (current) drug therapy: Secondary | ICD-10-CM | POA: Insufficient documentation

## 2016-01-01 DIAGNOSIS — R51 Headache: Secondary | ICD-10-CM | POA: Insufficient documentation

## 2016-01-01 DIAGNOSIS — R112 Nausea with vomiting, unspecified: Secondary | ICD-10-CM | POA: Diagnosis present

## 2016-01-01 DIAGNOSIS — Z862 Personal history of diseases of the blood and blood-forming organs and certain disorders involving the immune mechanism: Secondary | ICD-10-CM | POA: Insufficient documentation

## 2016-01-01 DIAGNOSIS — D631 Anemia in chronic kidney disease: Secondary | ICD-10-CM | POA: Diagnosis not present

## 2016-01-01 DIAGNOSIS — Q059 Spina bifida, unspecified: Secondary | ICD-10-CM | POA: Insufficient documentation

## 2016-01-01 DIAGNOSIS — N2581 Secondary hyperparathyroidism of renal origin: Secondary | ICD-10-CM | POA: Diagnosis not present

## 2016-01-01 DIAGNOSIS — N390 Urinary tract infection, site not specified: Secondary | ICD-10-CM | POA: Diagnosis not present

## 2016-01-01 DIAGNOSIS — Z91040 Latex allergy status: Secondary | ICD-10-CM | POA: Insufficient documentation

## 2016-01-01 DIAGNOSIS — Z992 Dependence on renal dialysis: Secondary | ICD-10-CM | POA: Diagnosis not present

## 2016-01-01 DIAGNOSIS — R519 Headache, unspecified: Secondary | ICD-10-CM

## 2016-01-01 DIAGNOSIS — Z8659 Personal history of other mental and behavioral disorders: Secondary | ICD-10-CM | POA: Insufficient documentation

## 2016-01-01 LAB — URINE MICROSCOPIC-ADD ON

## 2016-01-01 LAB — CBC WITH DIFFERENTIAL/PLATELET
BASOS ABS: 0 10*3/uL (ref 0.0–0.1)
Basophils Relative: 0 %
EOS ABS: 0.3 10*3/uL (ref 0.0–0.7)
EOS PCT: 5 %
HCT: 33.1 % — ABNORMAL LOW (ref 36.0–46.0)
Hemoglobin: 10.3 g/dL — ABNORMAL LOW (ref 12.0–15.0)
LYMPHS ABS: 1.2 10*3/uL (ref 0.7–4.0)
Lymphocytes Relative: 25 %
MCH: 27.8 pg (ref 26.0–34.0)
MCHC: 31.1 g/dL (ref 30.0–36.0)
MCV: 89.2 fL (ref 78.0–100.0)
Monocytes Absolute: 0.5 10*3/uL (ref 0.1–1.0)
Monocytes Relative: 10 %
Neutro Abs: 3.1 10*3/uL (ref 1.7–7.7)
Neutrophils Relative %: 60 %
PLATELETS: 157 10*3/uL (ref 150–400)
RBC: 3.71 MIL/uL — AB (ref 3.87–5.11)
RDW: 13.7 % (ref 11.5–15.5)
WBC: 5.1 10*3/uL (ref 4.0–10.5)

## 2016-01-01 LAB — URINALYSIS, ROUTINE W REFLEX MICROSCOPIC
Bilirubin Urine: NEGATIVE
Glucose, UA: NEGATIVE mg/dL
Ketones, ur: NEGATIVE mg/dL
NITRITE: NEGATIVE
PROTEIN: 100 mg/dL — AB
SPECIFIC GRAVITY, URINE: 1.007 (ref 1.005–1.030)
pH: 8.5 — ABNORMAL HIGH (ref 5.0–8.0)

## 2016-01-01 LAB — COMPREHENSIVE METABOLIC PANEL
ALT: 11 U/L — ABNORMAL LOW (ref 14–54)
ANION GAP: 9 (ref 5–15)
AST: 19 U/L (ref 15–41)
Albumin: 3.4 g/dL — ABNORMAL LOW (ref 3.5–5.0)
Alkaline Phosphatase: 100 U/L (ref 38–126)
BILIRUBIN TOTAL: 0.8 mg/dL (ref 0.3–1.2)
BUN: 16 mg/dL (ref 6–20)
CHLORIDE: 101 mmol/L (ref 101–111)
CO2: 33 mmol/L — ABNORMAL HIGH (ref 22–32)
Calcium: 9.4 mg/dL (ref 8.9–10.3)
Creatinine, Ser: 3.27 mg/dL — ABNORMAL HIGH (ref 0.44–1.00)
GFR, EST AFRICAN AMERICAN: 22 mL/min — AB (ref >60)
GFR, EST NON AFRICAN AMERICAN: 19 mL/min — AB (ref >60)
Glucose, Bld: 80 mg/dL (ref 65–99)
POTASSIUM: 3.2 mmol/L — AB (ref 3.5–5.1)
Sodium: 143 mmol/L (ref 135–145)
TOTAL PROTEIN: 6.5 g/dL (ref 6.5–8.1)

## 2016-01-01 MED ORDER — METOCLOPRAMIDE HCL 5 MG/ML IJ SOLN
10.0000 mg | Freq: Once | INTRAMUSCULAR | Status: AC
  Administered 2016-01-01: 10 mg via INTRAVENOUS
  Filled 2016-01-01: qty 2

## 2016-01-01 MED ORDER — FOSFOMYCIN TROMETHAMINE 3 G PO PACK
3.0000 g | PACK | Freq: Once | ORAL | Status: AC
  Administered 2016-01-01: 3 g via ORAL
  Filled 2016-01-01: qty 3

## 2016-01-01 MED ORDER — METOCLOPRAMIDE HCL 10 MG PO TABS: 10.0000 mg | ORAL_TABLET | Freq: Three times a day (TID) | ORAL | Status: DC | PRN

## 2016-01-01 NOTE — ED Notes (Signed)
Pt stable, ambulatory, states understanding of discharge instructions 

## 2016-01-01 NOTE — ED Notes (Addendum)
Patient complains of ongoing occipital headache x 1 month. Complains that it hurts to touch her scalp, nausea/vomiting intermittently with same. Also has had cloudy urine x 1 week. Has been taking antibiotic for same. Had full dialysis treatment today. Alert and oriented on arival. Patient has to [place] herself at home, unable to collect urine at triage

## 2016-01-01 NOTE — Discharge Instructions (Signed)
Take Reglan as prescribed as needed for both of vomiting and headache. He can continue taking Tylenol as well. He retreated today with a medication called fosfomycin for your bladder infection. This is a one time dose medication. If your symptoms are worsening make sure to follow up with your doctor or return to emergency department.  Catheter-Associated Urinary Tract Infection FAQs What is "catheter-associated urinary tract infection"? A urinary tract infection (also called "UTI") is an infection in the urinary system, which includes the bladder (which stores the urine) and the kidneys (which filter the blood to make urine). Germs (for example, bacteria or yeasts) do not normally live in these areas; but if germs are introduced, an infection can occur. If you have a urinary catheter, germs can travel along the catheter and cause an infection in your bladder or your kidney; in that case it is called a catheter-associated urinary tract infection (or "CA-UTI").  What is a urinary catheter? A urinary catheter is a thin tube placed in the bladder to drain urine. Urine drains through the tube into a bag that collects the urine. A urinary catheter may be used:  If you are not able to urinate on your own  To measure the amount of urine that you make, for example, during intensive care  During and after some types of surgery  During some tests of the kidneys and bladder People with urinary catheters have a much higher chance of getting a urinary tract infection than people who don't have a catheter. How do I get a catheter-associated urinary tract infection (CA-UTI)? If germs enter the urinary tract, they may cause an infection. Many of the germs that cause a catheter-associated urinary tract infection are common germs found in your intestines that do not usually cause an infection there. Germs can enter the urinary tract when the catheter is being put in or while the catheter remains in the bladder.    What are the symptoms of a urinary tract infection? Some of the common symptoms of a urinary tract infection are:  Burning or pain in the lower abdomen (that is, below the stomach)  Fever  Bloody urine may be a sign of infection, but is also caused by other problems  Burning during urination or an increase in the frequency of urination after the catheter is removed. Sometimes people with catheter-associated urinary tract infections do not have these symptoms of infection. Can catheter-associated urinary tract infections be treated? Yes, most catheter-associated urinary tract infections can be treated with antibiotics and removal or change of the catheter. Your doctor will determine which antibiotic is best for you.  What are some of the things that hospitals are doing to prevent catheter-associated urinary tract infections? To prevent urinary tract infections, doctors and nurses take the following actions.  Catheter insertion  External catheters in men (these look like condoms and are placed over the penis rather than into the penis)  Putting a temporary catheter in to drain the urine and removing it right away. This is called intermittent urethral catheterization. Catheter care What can I do to help prevent catheter-associated urinary tract infections if I have a catheter?  Always clean your hands before and after doing catheter care.  Always keep your urine bag below the level of your bladder.  Do not tug or pull on the tubing.  Do not twist or kink the catheter tubing.  Ask your healthcare provider each day if you still need the catheter. What do I need to do when  I go home from the hospital?  If you will be going home with a catheter, your doctor or nurse should explain everything you need to know about taking care of the catheter. Make sure you understand how to care for it before you leave the hospital.  If you develop any of the symptoms of a urinary tract infection, such  as burning or pain in the lower abdomen, fever, or an increase in the frequency of urination, contact your doctor or nurse immediately.  Before you go home, make sure you know who to contact if you have questions or problems after you get home. If you have questions, please ask your doctor or nurse. Developed and co-sponsored by [employer] for [place] [phone]); Infectious Diseases Society of [place] (IDSA); [place]; Association for Professionals in Infection Control and Epidemiology (APIC); Centers for Disease Control and Prevention (CDC); and [employer].   This information is not intended to replace advice given to you by your health care provider. Make sure you discuss any questions you have with your health care provider.   Document Released: 04/07/2012 Document Revised: 11/28/2014 Document Reviewed: 09/27/2014 Elsevier Interactive Patient Education 2016 [place] Headache Without Cause A headache is pain or discomfort felt around the head or neck area. The specific cause of a headache may not be found. There are many causes and types of headaches. A few common ones are:  Tension headaches.  Migraine headaches.  Cluster headaches.  Chronic daily headaches. HOME CARE INSTRUCTIONS  Watch your condition for any changes. Take these steps to help with your condition: Managing Pain  Take over-the-counter and prescription medicines only as told by your health care provider.  Lie down in a dark, quiet room when you have a headache.  If directed, apply ice to the head and neck area:  Put ice in a plastic bag.  Place a towel between your skin and the bag.  Leave the ice on for 20 minutes, 2-3 times per day.  Use a heating pad or hot shower to apply heat to the head and neck area as told by your health care provider.  Keep lights dim if bright lights bother you or make your headaches worse. Eating and  Drinking  Eat meals on a regular schedule.  Limit alcohol use.  Decrease the amount of caffeine you drink, or stop drinking caffeine. General Instructions  Keep all follow-up visits as told by your health care provider. This is important.  Keep a headache journal to help find out what may trigger your headaches. For example, write down:  What you eat and drink.  How much sleep you get.  Any change to your diet or medicines.  Try massage or other relaxation techniques.  Limit stress.  Sit up straight, and do not tense your muscles.  Do not use tobacco products, including cigarettes, chewing tobacco, or e-cigarettes. If you need help quitting, ask your health care provider.  Exercise regularly as told by your health care provider.  Sleep on a regular schedule. Get 7-9 hours of sleep, or the amount recommended by your health care provider. SEEK MEDICAL CARE IF:   Your symptoms are not helped by medicine.  You have a headache that is different from the usual headache.  You have nausea or you vomit.  You have a fever. SEEK IMMEDIATE MEDICAL CARE IF:   Your headache becomes severe.  You have repeated vomiting.  You have a stiff neck.  You have a loss of vision.  You have problems with speech.  You have pain in the eye or ear.  You have muscular weakness or loss of muscle control.  You lose your balance or have trouble walking.  You feel faint or pass out.  You have confusion.   This information is not intended to replace advice given to you by your health care provider. Make sure you discuss any questions you have with your health care provider.   Document Released: 07/14/2005 Document Revised: 04/04/2015 Document Reviewed: 11/06/2014 Elsevier Interactive Patient Education [employer].

## 2016-01-01 NOTE — ED Provider Notes (Signed)
CSN: [id number]     Arrival date & time 01/01/16  1502 History  By signing my name below, I, [name], attest that this documentation has been prepared under the direction and in the presence of non-physician practitioner, Jeannett Senior, PA-C. Electronically Signed: [name], Scribe. 01/01/2016. 7:27 PM.   Chief Complaint  Patient presents with  . Headache  . cloudy urine     The history is provided by the patient. No language interpreter was used.   HPI Comments:  [name] is a 25 y.o. female with a history of spina bifida, HTN and ESRD, who presents to the Emergency Department complaining of a persistent HA x ~ 1 month, worse today. She states her pain is diffusely throughout but she has had specific pain  to the occipital region that is also sore to the touch. She has taken tylenol and oxycodone without relief. She reports associated nausea and vomiting. She also notes mild abdominal pain. She believes she may have a UTI. Patient does self caths and states that her urine is more cloudy and thicker than usual. This is a recurrent issue, patient has had last bladder infection one month ago. Pt reports having diarrhea a few days ago, which resolved 2 days ago; she had a solid stool since. She denies fever and neck pain. No visual changes or photophobia. Pt has appointment with PCP scheduled for June 12th, 2017. She last received dialysis today.    Past Medical History  Diagnosis Date  . Spina bifida   . UTI (lower urinary tract infection)   . HTN (hypertension) 05/02/2011  . Anemia associated with chronic renal failure   . 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  . Blood transfusion   . Dialysis care   . ESRD (end stage renal disease) on dialysis ([place])   . Depression 05/14/2015   Past Surgical History  Procedure Laterality Date  . Av fistula placement      left arm   Family History  Problem Relation Age of Onset  . Kidney cancer Other     Social History  Substance Use Topics  . Smoking status: Never Smoker   . Smokeless tobacco: None  . Alcohol Use: No   OB History    No data available     Review of Systems  Constitutional: Negative for fever and chills.  HENT: Negative for congestion.   Eyes: Negative for photophobia.  Gastrointestinal: Positive for nausea, vomiting, abdominal pain and diarrhea (resolved).  Genitourinary:       Thicker and strong smelling urine  Musculoskeletal: Negative for neck pain.  Neurological: Positive for headaches. Negative for dizziness and light-headedness.  All other systems reviewed and are negative.  Allergies  Ciprofloxacin; Other; Peanut-containing drug products; Aleve; Influenza vaccines; Tetanus toxoids; and Latex  Home Medications   Prior to Admission medications   Medication Sig Start Date End Date Taking? Authorizing Provider  ceFEPIme 2 g in dextrose 5 % 50 mL Inject 2 g into the vein 2 (two) times a week. Saturday (5/6) and Tuesday (5/9) following dialysis 12/01/15  Yes Clayton Bibles, PA-C  cinacalcet (SENSIPAR) 30 MG tablet Take 30 mg by mouth daily.   Yes Historical Provider, MD  diphenhydrAMINE (BENADRYL) 25 mg capsule Take 1 capsule (25 mg total) by mouth every 6 (six) hours as needed for itching. 08/03/15  Yes Nita Sells, MD  Epoetin Alfa (EPOGEN IJ) Inject as directed. Pt gets on dialysis days which are Tuesday, Thursday,  and Saturday.   Yes Historical Provider, MD  HYDROcodone-acetaminophen (NORCO/VICODIN) 5-325 MG tablet Take 1 tablet by mouth every 4 (four) hours as needed for moderate pain or severe pain. 11/29/15  Yes Clayton Bibles, PA-C  metoCLOPramide (REGLAN) 10 MG tablet Take 1 tablet (10 mg total) by mouth every 8 (eight) hours as needed for nausea or vomiting. 11/29/15  Yes Clayton Bibles, PA-C  ondansetron (ZOFRAN ODT) 4 MG disintegrating tablet Take 1 tablet (4 mg total) by mouth every 8 (eight) hours as needed for nausea or vomiting. 11/05/15  Yes Merryl Hacker, MD  sevelamer carbonate (RENVELA) 800 MG tablet Take 800 mg by mouth 3 (three) times daily with meals.    Yes Historical Provider, MD   BP 121/72 mmHg  Pulse 85  Temp(Src) 98.9 F (37.2 C) (Oral)  Resp 18  Wt 65 lb 7.6 oz (29.7 kg)  SpO2 94% Physical Exam  Constitutional: She is oriented to person, place, and time. She appears well-developed and well-nourished. No distress.  HENT:  Head: Normocephalic and atraumatic.  Normal scalp, do not see any rashes or discoloration. Patient is diffusely tender over her scalp.  Eyes: Conjunctivae and EOM are normal. Pupils are equal, round, and reactive to light.  Neck: Neck supple.  No midline cervical or paraspinal tenderness. Full range of motion of the neck.  Cardiovascular: Normal rate, regular rhythm and normal heart sounds.   Pulmonary/Chest: Effort normal and breath sounds normal. No respiratory distress. She has no wheezes.  Abdominal: Soft. Bowel sounds are normal. She exhibits no distension. There is no tenderness. There is no rebound.  No CVA tenderness bilaterally  Musculoskeletal:  No legs present  Neurological: She is alert and oriented to person, place, and time. No cranial nerve deficit. Coordination normal.  Skin: Skin is warm and dry.  Psychiatric: She has a normal mood and affect.  Nursing note and vitals reviewed.   ED Course  Procedures  DIAGNOSTIC STUDIES:  Oxygen Saturation is 100% on RA, normal by my interpretation.    COORDINATION OF CARE:  6:40 PM Will order bloodwork and UA.  Discussed treatment plan with pt at bedside and pt agreed to plan.  Labs Review Labs Reviewed  CBC WITH DIFFERENTIAL/PLATELET - Abnormal; Notable for the following:    RBC 3.71 (*)    Hemoglobin 10.3 (*)    HCT 33.1 (*)    All other components within normal limits  COMPREHENSIVE METABOLIC PANEL - Abnormal; Notable for the following:    Potassium 3.2 (*)    CO2 33 (*)    Creatinine, Ser 3.27 (*)    Albumin 3.4 (*)    ALT  11 (*)    GFR calc non Af Amer 19 (*)    GFR calc Af Amer 22 (*)    All other components within normal limits  URINALYSIS, ROUTINE W REFLEX MICROSCOPIC (NOT AT [facility]) - Abnormal; Notable for the following:    APPearance CLOUDY (*)    pH 8.5 (*)    Hgb urine dipstick MODERATE (*)    Protein, ur 100 (*)    Leukocytes, UA LARGE (*)    All other components within normal limits  URINE MICROSCOPIC-ADD ON - Abnormal; Notable for the following:    Squamous Epithelial / LPF 0-5 (*)    Bacteria, UA MANY (*)    All other components within normal limits  URINE CULTURE    Imaging Review No results found. I have personally reviewed and evaluated these images and lab  results as part of my medical decision-making.   EKG Interpretation None      MDM   Final diagnoses:  None   Patient with multiple medical issues, here with worsening UTI symptoms and headache. Her headache is persistent for 1 month and is getting worse. It is mainly back over her head but radiates all over. On exam, she does have scalp tenderness which reproduces her pain. There is no rashes or discoloration. She does not have tight braided hair. I do not see a reason for her pain. Apparently she has an appointment with a headache specialist. I do not think her pain is due to a life-threatening process at this time. It has been persistent for months and she has no neuro deficits or neurological complaints. I will treat her headache with the Reglan, which also should help her nausea. We will get urinalysis and CBC and metabolic panel. Patient is mildly tachycardic, otherwise normal vital signs.  Patient's urinalysis shows too numerous to count white blood cells and many bacteria. Patient states that she uses sterile catheterization time she is herself. She believes that this time the infection came from diarrhea she has had recently. She denies any diarrhea at this time and had a normal formed stool. Her abdomen is benign, she has no  tenderness on exam. Again she is afebrile and has normal vital signs while in the emergency department. I reviewed her prior cultures, most recent cultures showed infection with Pseudomonas. Last infection was more month ago and she had to receive 3 doses of cefepime. Her culture was sensitive to Cipro which is the only oral antibiotic, however she is allergic to Cipro. I called and spoke with pharmacy, they recommended fosfomycin and stated that fosfomycin is appropriate for Pseudomonas treatment. Patient was given fosfomycin in emergency department. She tolerated it well with no vomiting. She actually states she felt better after the Reglan and her headache improved. At this time I will discharge her home. We'll send urine cultures. No evidence of sepsis at this time. I discussed with her return precautions. She will keep her appointment with her specialist and will follow up with family practice doctor.  Filed Vitals:   01/01/16 2115 01/01/16 2130 01/01/16 2145 01/01/16 2154  BP: 116/61 128/70 128/80   Pulse:    103  Temp:      TempSrc:      Resp:      Weight:      SpO2:    100%    I personally performed the services described in this documentation, which was scribed in my presence. The recorded information has been reviewed and is accurate.    Jeannett Senior, PA-C 01/01/16 [place], MD 01/07/16 [phone]

## 2016-01-03 DIAGNOSIS — N186 End stage renal disease: Secondary | ICD-10-CM | POA: Diagnosis not present

## 2016-01-03 DIAGNOSIS — N2581 Secondary hyperparathyroidism of renal origin: Secondary | ICD-10-CM | POA: Diagnosis not present

## 2016-01-03 DIAGNOSIS — N390 Urinary tract infection, site not specified: Secondary | ICD-10-CM | POA: Diagnosis not present

## 2016-01-03 DIAGNOSIS — D631 Anemia in chronic kidney disease: Secondary | ICD-10-CM | POA: Diagnosis not present

## 2016-01-05 DIAGNOSIS — N186 End stage renal disease: Secondary | ICD-10-CM | POA: Diagnosis not present

## 2016-01-05 DIAGNOSIS — N2581 Secondary hyperparathyroidism of renal origin: Secondary | ICD-10-CM | POA: Diagnosis not present

## 2016-01-05 DIAGNOSIS — D631 Anemia in chronic kidney disease: Secondary | ICD-10-CM | POA: Diagnosis not present

## 2016-01-05 DIAGNOSIS — N390 Urinary tract infection, site not specified: Secondary | ICD-10-CM | POA: Diagnosis not present

## 2016-01-07 DIAGNOSIS — Z049 Encounter for examination and observation for unspecified reason: Secondary | ICD-10-CM | POA: Diagnosis not present

## 2016-01-07 DIAGNOSIS — G43019 Migraine without aura, intractable, without status migrainosus: Secondary | ICD-10-CM | POA: Diagnosis not present

## 2016-01-08 DIAGNOSIS — N2581 Secondary hyperparathyroidism of renal origin: Secondary | ICD-10-CM | POA: Diagnosis not present

## 2016-01-08 DIAGNOSIS — N186 End stage renal disease: Secondary | ICD-10-CM | POA: Diagnosis not present

## 2016-01-08 DIAGNOSIS — D631 Anemia in chronic kidney disease: Secondary | ICD-10-CM | POA: Diagnosis not present

## 2016-01-08 DIAGNOSIS — N390 Urinary tract infection, site not specified: Secondary | ICD-10-CM | POA: Diagnosis not present

## 2016-01-10 DIAGNOSIS — N186 End stage renal disease: Secondary | ICD-10-CM | POA: Diagnosis not present

## 2016-01-10 DIAGNOSIS — D631 Anemia in chronic kidney disease: Secondary | ICD-10-CM | POA: Diagnosis not present

## 2016-01-10 DIAGNOSIS — N390 Urinary tract infection, site not specified: Secondary | ICD-10-CM | POA: Diagnosis not present

## 2016-01-10 DIAGNOSIS — N2581 Secondary hyperparathyroidism of renal origin: Secondary | ICD-10-CM | POA: Diagnosis not present

## 2016-01-11 LAB — SUSCEPTIBILITY, AER + ANAEROB

## 2016-01-12 DIAGNOSIS — N186 End stage renal disease: Secondary | ICD-10-CM | POA: Diagnosis not present

## 2016-01-12 DIAGNOSIS — N390 Urinary tract infection, site not specified: Secondary | ICD-10-CM | POA: Diagnosis not present

## 2016-01-12 DIAGNOSIS — N2581 Secondary hyperparathyroidism of renal origin: Secondary | ICD-10-CM | POA: Diagnosis not present

## 2016-01-12 DIAGNOSIS — D631 Anemia in chronic kidney disease: Secondary | ICD-10-CM | POA: Diagnosis not present

## 2016-01-12 LAB — URINE CULTURE

## 2016-01-13 ENCOUNTER — Telehealth (HOSPITAL_BASED_OUTPATIENT_CLINIC_OR_DEPARTMENT_OTHER): Payer: Self-pay

## 2016-01-13 NOTE — Telephone Encounter (Signed)
Post ED Visit - Positive Culture Follow-up  Culture report reviewed by antimicrobial stewardship pharmacist:  []  Elenor Quinones, Pharm.D. []  Heide Guile, Pharm.D., BCPS []  Parks Neptune, Pharm.D. []  Alycia Rossetti, Pharm.D., BCPS []  [place], [place].D., BCPS, AAHIVP []  Legrand Como, Pharm.D., BCPS, AAHIVP []  Cassie Stewart, Pharm.D. []  Rob Evette Doffing, Pharm.D. Meagan Decker Pharm D Positive urine culture Treated with Fosfomycin, organism sensitive to the same and no further patient follow-up is required at this time.  Genia Del 01/13/2016, 8:54 AM

## 2016-01-15 DIAGNOSIS — N2581 Secondary hyperparathyroidism of renal origin: Secondary | ICD-10-CM | POA: Diagnosis not present

## 2016-01-15 DIAGNOSIS — N390 Urinary tract infection, site not specified: Secondary | ICD-10-CM | POA: Diagnosis not present

## 2016-01-15 DIAGNOSIS — D631 Anemia in chronic kidney disease: Secondary | ICD-10-CM | POA: Diagnosis not present

## 2016-01-15 DIAGNOSIS — N186 End stage renal disease: Secondary | ICD-10-CM | POA: Diagnosis not present

## 2016-01-17 DIAGNOSIS — D631 Anemia in chronic kidney disease: Secondary | ICD-10-CM | POA: Diagnosis not present

## 2016-01-17 DIAGNOSIS — N186 End stage renal disease: Secondary | ICD-10-CM | POA: Diagnosis not present

## 2016-01-17 DIAGNOSIS — N390 Urinary tract infection, site not specified: Secondary | ICD-10-CM | POA: Diagnosis not present

## 2016-01-17 DIAGNOSIS — N2581 Secondary hyperparathyroidism of renal origin: Secondary | ICD-10-CM | POA: Diagnosis not present

## 2016-01-19 DIAGNOSIS — N390 Urinary tract infection, site not specified: Secondary | ICD-10-CM | POA: Diagnosis not present

## 2016-01-19 DIAGNOSIS — N2581 Secondary hyperparathyroidism of renal origin: Secondary | ICD-10-CM | POA: Diagnosis not present

## 2016-01-19 DIAGNOSIS — D631 Anemia in chronic kidney disease: Secondary | ICD-10-CM | POA: Diagnosis not present

## 2016-01-19 DIAGNOSIS — N186 End stage renal disease: Secondary | ICD-10-CM | POA: Diagnosis not present

## 2016-01-22 DIAGNOSIS — N186 End stage renal disease: Secondary | ICD-10-CM | POA: Diagnosis not present

## 2016-01-22 DIAGNOSIS — D631 Anemia in chronic kidney disease: Secondary | ICD-10-CM | POA: Diagnosis not present

## 2016-01-22 DIAGNOSIS — N2581 Secondary hyperparathyroidism of renal origin: Secondary | ICD-10-CM | POA: Diagnosis not present

## 2016-01-22 DIAGNOSIS — N390 Urinary tract infection, site not specified: Secondary | ICD-10-CM | POA: Diagnosis not present

## 2016-01-24 DIAGNOSIS — N186 End stage renal disease: Secondary | ICD-10-CM | POA: Diagnosis not present

## 2016-01-24 DIAGNOSIS — N390 Urinary tract infection, site not specified: Secondary | ICD-10-CM | POA: Diagnosis not present

## 2016-01-24 DIAGNOSIS — D631 Anemia in chronic kidney disease: Secondary | ICD-10-CM | POA: Diagnosis not present

## 2016-01-24 DIAGNOSIS — N2581 Secondary hyperparathyroidism of renal origin: Secondary | ICD-10-CM | POA: Diagnosis not present

## 2016-01-25 DIAGNOSIS — I120 Hypertensive chronic kidney disease with stage 5 chronic kidney disease or end stage renal disease: Secondary | ICD-10-CM | POA: Diagnosis not present

## 2016-01-25 DIAGNOSIS — N186 End stage renal disease: Secondary | ICD-10-CM | POA: Diagnosis not present

## 2016-01-25 DIAGNOSIS — Z992 Dependence on renal dialysis: Secondary | ICD-10-CM | POA: Diagnosis not present

## 2016-01-26 DIAGNOSIS — N2581 Secondary hyperparathyroidism of renal origin: Secondary | ICD-10-CM | POA: Diagnosis not present

## 2016-01-26 DIAGNOSIS — N186 End stage renal disease: Secondary | ICD-10-CM | POA: Diagnosis not present

## 2016-01-26 DIAGNOSIS — D631 Anemia in chronic kidney disease: Secondary | ICD-10-CM | POA: Diagnosis not present

## 2016-01-29 DIAGNOSIS — N186 End stage renal disease: Secondary | ICD-10-CM | POA: Diagnosis not present

## 2016-01-29 DIAGNOSIS — N2581 Secondary hyperparathyroidism of renal origin: Secondary | ICD-10-CM | POA: Diagnosis not present

## 2016-01-29 DIAGNOSIS — D631 Anemia in chronic kidney disease: Secondary | ICD-10-CM | POA: Diagnosis not present

## 2016-01-31 DIAGNOSIS — N2581 Secondary hyperparathyroidism of renal origin: Secondary | ICD-10-CM | POA: Diagnosis not present

## 2016-01-31 DIAGNOSIS — D631 Anemia in chronic kidney disease: Secondary | ICD-10-CM | POA: Diagnosis not present

## 2016-01-31 DIAGNOSIS — N186 End stage renal disease: Secondary | ICD-10-CM | POA: Diagnosis not present

## 2016-01-31 LAB — SUSCEPTIBILITY RESULT

## 2016-02-02 DIAGNOSIS — N2581 Secondary hyperparathyroidism of renal origin: Secondary | ICD-10-CM | POA: Diagnosis not present

## 2016-02-02 DIAGNOSIS — N186 End stage renal disease: Secondary | ICD-10-CM | POA: Diagnosis not present

## 2016-02-02 DIAGNOSIS — D631 Anemia in chronic kidney disease: Secondary | ICD-10-CM | POA: Diagnosis not present

## 2016-02-05 DIAGNOSIS — D631 Anemia in chronic kidney disease: Secondary | ICD-10-CM | POA: Diagnosis not present

## 2016-02-05 DIAGNOSIS — N2581 Secondary hyperparathyroidism of renal origin: Secondary | ICD-10-CM | POA: Diagnosis not present

## 2016-02-05 DIAGNOSIS — N186 End stage renal disease: Secondary | ICD-10-CM | POA: Diagnosis not present

## 2016-02-07 DIAGNOSIS — N2581 Secondary hyperparathyroidism of renal origin: Secondary | ICD-10-CM | POA: Diagnosis not present

## 2016-02-07 DIAGNOSIS — D631 Anemia in chronic kidney disease: Secondary | ICD-10-CM | POA: Diagnosis not present

## 2016-02-07 DIAGNOSIS — N186 End stage renal disease: Secondary | ICD-10-CM | POA: Diagnosis not present

## 2016-02-09 DIAGNOSIS — N186 End stage renal disease: Secondary | ICD-10-CM | POA: Diagnosis not present

## 2016-02-09 DIAGNOSIS — N2581 Secondary hyperparathyroidism of renal origin: Secondary | ICD-10-CM | POA: Diagnosis not present

## 2016-02-09 DIAGNOSIS — D631 Anemia in chronic kidney disease: Secondary | ICD-10-CM | POA: Diagnosis not present

## 2016-02-12 DIAGNOSIS — N186 End stage renal disease: Secondary | ICD-10-CM | POA: Diagnosis not present

## 2016-02-12 DIAGNOSIS — N2581 Secondary hyperparathyroidism of renal origin: Secondary | ICD-10-CM | POA: Diagnosis not present

## 2016-02-12 DIAGNOSIS — D631 Anemia in chronic kidney disease: Secondary | ICD-10-CM | POA: Diagnosis not present

## 2016-02-13 DIAGNOSIS — L02219 Cutaneous abscess of trunk, unspecified: Secondary | ICD-10-CM | POA: Diagnosis not present

## 2016-02-14 DIAGNOSIS — N2581 Secondary hyperparathyroidism of renal origin: Secondary | ICD-10-CM | POA: Diagnosis not present

## 2016-02-14 DIAGNOSIS — D631 Anemia in chronic kidney disease: Secondary | ICD-10-CM | POA: Diagnosis not present

## 2016-02-14 DIAGNOSIS — N186 End stage renal disease: Secondary | ICD-10-CM | POA: Diagnosis not present

## 2016-02-16 DIAGNOSIS — D631 Anemia in chronic kidney disease: Secondary | ICD-10-CM | POA: Diagnosis not present

## 2016-02-16 DIAGNOSIS — N2581 Secondary hyperparathyroidism of renal origin: Secondary | ICD-10-CM | POA: Diagnosis not present

## 2016-02-16 DIAGNOSIS — N186 End stage renal disease: Secondary | ICD-10-CM | POA: Diagnosis not present

## 2016-02-19 DIAGNOSIS — D631 Anemia in chronic kidney disease: Secondary | ICD-10-CM | POA: Diagnosis not present

## 2016-02-19 DIAGNOSIS — N2581 Secondary hyperparathyroidism of renal origin: Secondary | ICD-10-CM | POA: Diagnosis not present

## 2016-02-19 DIAGNOSIS — N186 End stage renal disease: Secondary | ICD-10-CM | POA: Diagnosis not present

## 2016-02-21 DIAGNOSIS — N186 End stage renal disease: Secondary | ICD-10-CM | POA: Diagnosis not present

## 2016-02-21 DIAGNOSIS — D631 Anemia in chronic kidney disease: Secondary | ICD-10-CM | POA: Diagnosis not present

## 2016-02-21 DIAGNOSIS — N2581 Secondary hyperparathyroidism of renal origin: Secondary | ICD-10-CM | POA: Diagnosis not present

## 2016-02-23 DIAGNOSIS — D631 Anemia in chronic kidney disease: Secondary | ICD-10-CM | POA: Diagnosis not present

## 2016-02-23 DIAGNOSIS — N186 End stage renal disease: Secondary | ICD-10-CM | POA: Diagnosis not present

## 2016-02-23 DIAGNOSIS — N2581 Secondary hyperparathyroidism of renal origin: Secondary | ICD-10-CM | POA: Diagnosis not present

## 2016-02-25 DIAGNOSIS — Z992 Dependence on renal dialysis: Secondary | ICD-10-CM | POA: Diagnosis not present

## 2016-02-25 DIAGNOSIS — I120 Hypertensive chronic kidney disease with stage 5 chronic kidney disease or end stage renal disease: Secondary | ICD-10-CM | POA: Diagnosis not present

## 2016-02-25 DIAGNOSIS — N186 End stage renal disease: Secondary | ICD-10-CM | POA: Diagnosis not present

## 2016-02-26 DIAGNOSIS — N186 End stage renal disease: Secondary | ICD-10-CM | POA: Diagnosis not present

## 2016-02-26 DIAGNOSIS — N2581 Secondary hyperparathyroidism of renal origin: Secondary | ICD-10-CM | POA: Diagnosis not present

## 2016-02-27 DIAGNOSIS — G43019 Migraine without aura, intractable, without status migrainosus: Secondary | ICD-10-CM | POA: Diagnosis not present

## 2016-02-28 DIAGNOSIS — N186 End stage renal disease: Secondary | ICD-10-CM | POA: Diagnosis not present

## 2016-02-28 DIAGNOSIS — N2581 Secondary hyperparathyroidism of renal origin: Secondary | ICD-10-CM | POA: Diagnosis not present

## 2016-03-01 DIAGNOSIS — N186 End stage renal disease: Secondary | ICD-10-CM | POA: Diagnosis not present

## 2016-03-01 DIAGNOSIS — N2581 Secondary hyperparathyroidism of renal origin: Secondary | ICD-10-CM | POA: Diagnosis not present

## 2016-03-04 DIAGNOSIS — N2581 Secondary hyperparathyroidism of renal origin: Secondary | ICD-10-CM | POA: Diagnosis not present

## 2016-03-04 DIAGNOSIS — N186 End stage renal disease: Secondary | ICD-10-CM | POA: Diagnosis not present

## 2016-03-06 ENCOUNTER — Emergency Department (HOSPITAL_COMMUNITY)
Admission: EM | Admit: 2016-03-06 | Discharge: 2016-03-07 | Disposition: A | Discharge status: Home / Self Care | Payer: Medicare Other | Attending: Dermatology | Admitting: Dermatology

## 2016-03-06 ENCOUNTER — Encounter (HOSPITAL_COMMUNITY): Payer: Self-pay | Admitting: *Deleted

## 2016-03-06 DIAGNOSIS — N186 End stage renal disease: Secondary | ICD-10-CM | POA: Insufficient documentation

## 2016-03-06 DIAGNOSIS — R109 Unspecified abdominal pain: Secondary | ICD-10-CM | POA: Diagnosis not present

## 2016-03-06 DIAGNOSIS — Z79899 Other long term (current) drug therapy: Secondary | ICD-10-CM | POA: Diagnosis not present

## 2016-03-06 DIAGNOSIS — R1110 Vomiting, unspecified: Secondary | ICD-10-CM | POA: Insufficient documentation

## 2016-03-06 DIAGNOSIS — Z5321 Procedure and treatment not carried out due to patient leaving prior to being seen by health care provider: Secondary | ICD-10-CM | POA: Diagnosis not present

## 2016-03-06 DIAGNOSIS — I1311 Hypertensive heart and chronic kidney disease without heart failure, with stage 5 chronic kidney disease, or end stage renal disease: Secondary | ICD-10-CM | POA: Diagnosis not present

## 2016-03-06 DIAGNOSIS — N189 Chronic kidney disease, unspecified: Secondary | ICD-10-CM | POA: Insufficient documentation

## 2016-03-06 DIAGNOSIS — Z992 Dependence on renal dialysis: Secondary | ICD-10-CM | POA: Diagnosis not present

## 2016-03-06 DIAGNOSIS — N2581 Secondary hyperparathyroidism of renal origin: Secondary | ICD-10-CM | POA: Diagnosis not present

## 2016-03-06 LAB — CBC
HEMATOCRIT: 38.2 % (ref 36.0–46.0)
Hemoglobin: 12.1 g/dL (ref 12.0–15.0)
MCH: 29.4 pg (ref 26.0–34.0)
MCHC: 31.7 g/dL (ref 30.0–36.0)
MCV: 92.7 fL (ref 78.0–100.0)
Platelets: 193 10*3/uL (ref 150–400)
RBC: 4.12 MIL/uL (ref 3.87–5.11)
RDW: 15.6 % — ABNORMAL HIGH (ref 11.5–15.5)
WBC: 5.1 10*3/uL (ref 4.0–10.5)

## 2016-03-06 LAB — COMPREHENSIVE METABOLIC PANEL
ALK PHOS: 72 U/L (ref 38–126)
ALT: 14 U/L (ref 14–54)
AST: 22 U/L (ref 15–41)
Albumin: 4.2 g/dL (ref 3.5–5.0)
Anion gap: 13 (ref 5–15)
BILIRUBIN TOTAL: 0.8 mg/dL (ref 0.3–1.2)
BUN: 21 mg/dL — ABNORMAL HIGH (ref 6–20)
CALCIUM: 10 mg/dL (ref 8.9–10.3)
CO2: 31 mmol/L (ref 22–32)
Chloride: 97 mmol/L — ABNORMAL LOW (ref 101–111)
Creatinine, Ser: 3.91 mg/dL — ABNORMAL HIGH (ref 0.44–1.00)
GFR calc Af Amer: 18 mL/min — ABNORMAL LOW (ref >60)
GFR, EST NON AFRICAN AMERICAN: 15 mL/min — AB (ref >60)
Glucose, Bld: 94 mg/dL (ref 65–99)
POTASSIUM: 3.8 mmol/L (ref 3.5–5.1)
Sodium: 141 mmol/L (ref 135–145)
TOTAL PROTEIN: 8.1 g/dL (ref 6.5–8.1)

## 2016-03-06 LAB — LIPASE, BLOOD: Lipase: 62 U/L — ABNORMAL HIGH (ref 11–51)

## 2016-03-06 LAB — I-STAT BETA HCG BLOOD, ED (MC, WL, AP ONLY)

## 2016-03-06 NOTE — ED Notes (Signed)
Pt called for v/s recheck no response from lobby 

## 2016-03-06 NOTE — ED Triage Notes (Signed)
Pt states that she had vomited 2-3 times in the last 2 days; pt states that there was some blood mixed in the vomit and that concerned her family; pt states that she has burning to her stomach at times and pt states that she has been having a lot "throat pain" pt states that it feels like "stuff is crawling up" her throat; pt denies diarrhea; pt states that she has a family hx of GERD

## 2016-03-06 NOTE — ED Notes (Signed)
Pt called for recheck v/s no response

## 2016-03-08 DIAGNOSIS — N186 End stage renal disease: Secondary | ICD-10-CM | POA: Diagnosis not present

## 2016-03-08 DIAGNOSIS — N2581 Secondary hyperparathyroidism of renal origin: Secondary | ICD-10-CM | POA: Diagnosis not present

## 2016-03-11 DIAGNOSIS — N186 End stage renal disease: Secondary | ICD-10-CM | POA: Diagnosis not present

## 2016-03-11 DIAGNOSIS — N2581 Secondary hyperparathyroidism of renal origin: Secondary | ICD-10-CM | POA: Diagnosis not present

## 2016-03-13 DIAGNOSIS — N2581 Secondary hyperparathyroidism of renal origin: Secondary | ICD-10-CM | POA: Diagnosis not present

## 2016-03-13 DIAGNOSIS — N186 End stage renal disease: Secondary | ICD-10-CM | POA: Diagnosis not present

## 2016-03-15 DIAGNOSIS — N186 End stage renal disease: Secondary | ICD-10-CM | POA: Diagnosis not present

## 2016-03-15 DIAGNOSIS — N2581 Secondary hyperparathyroidism of renal origin: Secondary | ICD-10-CM | POA: Diagnosis not present

## 2016-03-18 DIAGNOSIS — N2581 Secondary hyperparathyroidism of renal origin: Secondary | ICD-10-CM | POA: Diagnosis not present

## 2016-03-18 DIAGNOSIS — N186 End stage renal disease: Secondary | ICD-10-CM | POA: Diagnosis not present

## 2016-03-20 DIAGNOSIS — N186 End stage renal disease: Secondary | ICD-10-CM | POA: Diagnosis not present

## 2016-03-20 DIAGNOSIS — N2581 Secondary hyperparathyroidism of renal origin: Secondary | ICD-10-CM | POA: Diagnosis not present

## 2016-03-22 DIAGNOSIS — N2581 Secondary hyperparathyroidism of renal origin: Secondary | ICD-10-CM | POA: Diagnosis not present

## 2016-03-22 DIAGNOSIS — N186 End stage renal disease: Secondary | ICD-10-CM | POA: Diagnosis not present

## 2016-03-25 DIAGNOSIS — N186 End stage renal disease: Secondary | ICD-10-CM | POA: Diagnosis not present

## 2016-03-25 DIAGNOSIS — N2581 Secondary hyperparathyroidism of renal origin: Secondary | ICD-10-CM | POA: Diagnosis not present

## 2016-03-27 DIAGNOSIS — Z992 Dependence on renal dialysis: Secondary | ICD-10-CM | POA: Diagnosis not present

## 2016-03-27 DIAGNOSIS — N2581 Secondary hyperparathyroidism of renal origin: Secondary | ICD-10-CM | POA: Diagnosis not present

## 2016-03-27 DIAGNOSIS — I120 Hypertensive chronic kidney disease with stage 5 chronic kidney disease or end stage renal disease: Secondary | ICD-10-CM | POA: Diagnosis not present

## 2016-03-27 DIAGNOSIS — N186 End stage renal disease: Secondary | ICD-10-CM | POA: Diagnosis not present

## 2016-03-29 DIAGNOSIS — N186 End stage renal disease: Secondary | ICD-10-CM | POA: Diagnosis not present

## 2016-03-29 DIAGNOSIS — N2581 Secondary hyperparathyroidism of renal origin: Secondary | ICD-10-CM | POA: Diagnosis not present

## 2016-03-29 DIAGNOSIS — D631 Anemia in chronic kidney disease: Secondary | ICD-10-CM | POA: Diagnosis not present

## 2016-04-01 DIAGNOSIS — N186 End stage renal disease: Secondary | ICD-10-CM | POA: Diagnosis not present

## 2016-04-01 DIAGNOSIS — N2581 Secondary hyperparathyroidism of renal origin: Secondary | ICD-10-CM | POA: Diagnosis not present

## 2016-04-01 DIAGNOSIS — D631 Anemia in chronic kidney disease: Secondary | ICD-10-CM | POA: Diagnosis not present

## 2016-04-03 DIAGNOSIS — D631 Anemia in chronic kidney disease: Secondary | ICD-10-CM | POA: Diagnosis not present

## 2016-04-03 DIAGNOSIS — N2581 Secondary hyperparathyroidism of renal origin: Secondary | ICD-10-CM | POA: Diagnosis not present

## 2016-04-03 DIAGNOSIS — N186 End stage renal disease: Secondary | ICD-10-CM | POA: Diagnosis not present

## 2016-04-05 DIAGNOSIS — N186 End stage renal disease: Secondary | ICD-10-CM | POA: Diagnosis not present

## 2016-04-05 DIAGNOSIS — N2581 Secondary hyperparathyroidism of renal origin: Secondary | ICD-10-CM | POA: Diagnosis not present

## 2016-04-05 DIAGNOSIS — D631 Anemia in chronic kidney disease: Secondary | ICD-10-CM | POA: Diagnosis not present

## 2016-04-08 DIAGNOSIS — N2581 Secondary hyperparathyroidism of renal origin: Secondary | ICD-10-CM | POA: Diagnosis not present

## 2016-04-08 DIAGNOSIS — D631 Anemia in chronic kidney disease: Secondary | ICD-10-CM | POA: Diagnosis not present

## 2016-04-08 DIAGNOSIS — N186 End stage renal disease: Secondary | ICD-10-CM | POA: Diagnosis not present

## 2016-04-10 DIAGNOSIS — N2581 Secondary hyperparathyroidism of renal origin: Secondary | ICD-10-CM | POA: Diagnosis not present

## 2016-04-10 DIAGNOSIS — N186 End stage renal disease: Secondary | ICD-10-CM | POA: Diagnosis not present

## 2016-04-10 DIAGNOSIS — D631 Anemia in chronic kidney disease: Secondary | ICD-10-CM | POA: Diagnosis not present

## 2016-04-12 DIAGNOSIS — D631 Anemia in chronic kidney disease: Secondary | ICD-10-CM | POA: Diagnosis not present

## 2016-04-12 DIAGNOSIS — N2581 Secondary hyperparathyroidism of renal origin: Secondary | ICD-10-CM | POA: Diagnosis not present

## 2016-04-12 DIAGNOSIS — N186 End stage renal disease: Secondary | ICD-10-CM | POA: Diagnosis not present

## 2016-04-15 DIAGNOSIS — N2581 Secondary hyperparathyroidism of renal origin: Secondary | ICD-10-CM | POA: Diagnosis not present

## 2016-04-15 DIAGNOSIS — D631 Anemia in chronic kidney disease: Secondary | ICD-10-CM | POA: Diagnosis not present

## 2016-04-15 DIAGNOSIS — N186 End stage renal disease: Secondary | ICD-10-CM | POA: Diagnosis not present

## 2016-04-17 DIAGNOSIS — D631 Anemia in chronic kidney disease: Secondary | ICD-10-CM | POA: Diagnosis not present

## 2016-04-17 DIAGNOSIS — N186 End stage renal disease: Secondary | ICD-10-CM | POA: Diagnosis not present

## 2016-04-17 DIAGNOSIS — N2581 Secondary hyperparathyroidism of renal origin: Secondary | ICD-10-CM | POA: Diagnosis not present

## 2016-04-19 DIAGNOSIS — N2581 Secondary hyperparathyroidism of renal origin: Secondary | ICD-10-CM | POA: Diagnosis not present

## 2016-04-19 DIAGNOSIS — D631 Anemia in chronic kidney disease: Secondary | ICD-10-CM | POA: Diagnosis not present

## 2016-04-19 DIAGNOSIS — N186 End stage renal disease: Secondary | ICD-10-CM | POA: Diagnosis not present

## 2016-04-21 DIAGNOSIS — E782 Mixed hyperlipidemia: Secondary | ICD-10-CM | POA: Diagnosis not present

## 2016-04-21 DIAGNOSIS — Q760 Spina bifida occulta: Secondary | ICD-10-CM | POA: Diagnosis not present

## 2016-04-21 DIAGNOSIS — Z992 Dependence on renal dialysis: Secondary | ICD-10-CM | POA: Diagnosis not present

## 2016-04-21 DIAGNOSIS — L910 Hypertrophic scar: Secondary | ICD-10-CM | POA: Diagnosis not present

## 2016-04-22 DIAGNOSIS — N186 End stage renal disease: Secondary | ICD-10-CM | POA: Diagnosis not present

## 2016-04-22 DIAGNOSIS — D631 Anemia in chronic kidney disease: Secondary | ICD-10-CM | POA: Diagnosis not present

## 2016-04-22 DIAGNOSIS — N2581 Secondary hyperparathyroidism of renal origin: Secondary | ICD-10-CM | POA: Diagnosis not present

## 2016-04-24 DIAGNOSIS — D631 Anemia in chronic kidney disease: Secondary | ICD-10-CM | POA: Diagnosis not present

## 2016-04-24 DIAGNOSIS — N2581 Secondary hyperparathyroidism of renal origin: Secondary | ICD-10-CM | POA: Diagnosis not present

## 2016-04-24 DIAGNOSIS — N186 End stage renal disease: Secondary | ICD-10-CM | POA: Diagnosis not present

## 2016-04-26 DIAGNOSIS — D631 Anemia in chronic kidney disease: Secondary | ICD-10-CM | POA: Diagnosis not present

## 2016-04-26 DIAGNOSIS — N2581 Secondary hyperparathyroidism of renal origin: Secondary | ICD-10-CM | POA: Diagnosis not present

## 2016-04-26 DIAGNOSIS — Z992 Dependence on renal dialysis: Secondary | ICD-10-CM | POA: Diagnosis not present

## 2016-04-26 DIAGNOSIS — N186 End stage renal disease: Secondary | ICD-10-CM | POA: Diagnosis not present

## 2016-04-26 DIAGNOSIS — I120 Hypertensive chronic kidney disease with stage 5 chronic kidney disease or end stage renal disease: Secondary | ICD-10-CM | POA: Diagnosis not present

## 2016-04-29 DIAGNOSIS — N2581 Secondary hyperparathyroidism of renal origin: Secondary | ICD-10-CM | POA: Diagnosis not present

## 2016-04-29 DIAGNOSIS — N186 End stage renal disease: Secondary | ICD-10-CM | POA: Diagnosis not present

## 2016-05-01 DIAGNOSIS — N186 End stage renal disease: Secondary | ICD-10-CM | POA: Diagnosis not present

## 2016-05-01 DIAGNOSIS — N2581 Secondary hyperparathyroidism of renal origin: Secondary | ICD-10-CM | POA: Diagnosis not present

## 2016-05-03 DIAGNOSIS — N186 End stage renal disease: Secondary | ICD-10-CM | POA: Diagnosis not present

## 2016-05-03 DIAGNOSIS — N2581 Secondary hyperparathyroidism of renal origin: Secondary | ICD-10-CM | POA: Diagnosis not present

## 2016-05-06 DIAGNOSIS — N186 End stage renal disease: Secondary | ICD-10-CM | POA: Diagnosis not present

## 2016-05-06 DIAGNOSIS — N2581 Secondary hyperparathyroidism of renal origin: Secondary | ICD-10-CM | POA: Diagnosis not present

## 2016-05-08 DIAGNOSIS — N2581 Secondary hyperparathyroidism of renal origin: Secondary | ICD-10-CM | POA: Diagnosis not present

## 2016-05-08 DIAGNOSIS — N186 End stage renal disease: Secondary | ICD-10-CM | POA: Diagnosis not present

## 2016-05-10 DIAGNOSIS — N186 End stage renal disease: Secondary | ICD-10-CM | POA: Diagnosis not present

## 2016-05-10 DIAGNOSIS — N2581 Secondary hyperparathyroidism of renal origin: Secondary | ICD-10-CM | POA: Diagnosis not present

## 2016-05-13 DIAGNOSIS — N186 End stage renal disease: Secondary | ICD-10-CM | POA: Diagnosis not present

## 2016-05-13 DIAGNOSIS — N2581 Secondary hyperparathyroidism of renal origin: Secondary | ICD-10-CM | POA: Diagnosis not present

## 2016-05-15 DIAGNOSIS — N2581 Secondary hyperparathyroidism of renal origin: Secondary | ICD-10-CM | POA: Diagnosis not present

## 2016-05-15 DIAGNOSIS — N186 End stage renal disease: Secondary | ICD-10-CM | POA: Diagnosis not present

## 2016-05-17 DIAGNOSIS — N186 End stage renal disease: Secondary | ICD-10-CM | POA: Diagnosis not present

## 2016-05-17 DIAGNOSIS — N2581 Secondary hyperparathyroidism of renal origin: Secondary | ICD-10-CM | POA: Diagnosis not present

## 2016-05-20 ENCOUNTER — Emergency Department (HOSPITAL_COMMUNITY)
Admission: EM | Admit: 2016-05-20 | Discharge: 2016-05-20 | Disposition: A | Discharge status: Home / Self Care | Payer: Medicare Other | Attending: Emergency Medicine | Admitting: Emergency Medicine

## 2016-05-20 ENCOUNTER — Encounter (HOSPITAL_COMMUNITY): Payer: Self-pay | Admitting: Emergency Medicine

## 2016-05-20 DIAGNOSIS — Z79899 Other long term (current) drug therapy: Secondary | ICD-10-CM | POA: Insufficient documentation

## 2016-05-20 DIAGNOSIS — Z992 Dependence on renal dialysis: Secondary | ICD-10-CM | POA: Insufficient documentation

## 2016-05-20 DIAGNOSIS — N186 End stage renal disease: Secondary | ICD-10-CM | POA: Insufficient documentation

## 2016-05-20 DIAGNOSIS — I120 Hypertensive chronic kidney disease with stage 5 chronic kidney disease or end stage renal disease: Secondary | ICD-10-CM | POA: Insufficient documentation

## 2016-05-20 DIAGNOSIS — T7401XA Adult neglect or abandonment, confirmed, initial encounter: Secondary | ICD-10-CM

## 2016-05-20 DIAGNOSIS — N2581 Secondary hyperparathyroidism of renal origin: Secondary | ICD-10-CM | POA: Diagnosis not present
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
## 2016-05-20 NOTE — Care Management (Signed)
Pt receives dialysis at [facility] [phone].

## 2016-05-20 NOTE — ED Notes (Signed)
Pt speaking easily and calmly with pharm tech.
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
## 2016-05-20 NOTE — ED Notes (Signed)
Pt given food tray. Taking po and tolerating well.

## 2016-05-20 NOTE — Progress Notes (Signed)
Spoke with pt who wants to go home.  Pt states her aunt will be available to care for her, if necessary, and will also be able to make sure she has food.  Pt will eat dinner prior to d/c from ED.  Discussed with RN and PA.
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
## 2016-05-20 NOTE — ED Notes (Signed)
CSW at bedside.

## 2016-05-20 NOTE — ED Notes (Signed)
Assisted with self cath and cleaned of stool.
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
## 2016-05-20 NOTE — ED Notes (Signed)
[employer]  Is here and will transport wheel chair as [facility] transports patient . Pt is agreeable to same. Pt stat she understands instructions and home stable with [facility].
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
## 2016-05-20 NOTE — ED Notes (Signed)
[employer] arrived.

## 2016-05-20 NOTE — ED Notes (Signed)
[facility] here for transport. [facility] states they are unable to transport w/c. [employer] contacted to determine if they ncan transport.

## 2016-05-22 DIAGNOSIS — N186 End stage renal disease: Secondary | ICD-10-CM | POA: Diagnosis not present

## 2016-05-22 DIAGNOSIS — N2581 Secondary hyperparathyroidism of renal origin: Secondary | ICD-10-CM | POA: Diagnosis not present

## 2016-05-27 DIAGNOSIS — D631 Anemia in chronic kidney disease: Secondary | ICD-10-CM | POA: Diagnosis not present

## 2016-05-27 DIAGNOSIS — I120 Hypertensive chronic kidney disease with stage 5 chronic kidney disease or end stage renal disease: Secondary | ICD-10-CM | POA: Diagnosis not present

## 2016-05-27 DIAGNOSIS — N2581 Secondary hyperparathyroidism of renal origin: Secondary | ICD-10-CM | POA: Diagnosis not present

## 2016-05-27 DIAGNOSIS — N186 End stage renal disease: Secondary | ICD-10-CM | POA: Diagnosis not present

## 2016-05-27 DIAGNOSIS — Z992 Dependence on renal dialysis: Secondary | ICD-10-CM | POA: Diagnosis not present

## 2016-05-29 DIAGNOSIS — N186 End stage renal disease: Secondary | ICD-10-CM | POA: Diagnosis not present

## 2016-05-29 DIAGNOSIS — N2581 Secondary hyperparathyroidism of renal origin: Secondary | ICD-10-CM | POA: Diagnosis not present

## 2016-05-29 DIAGNOSIS — D631 Anemia in chronic kidney disease: Secondary | ICD-10-CM | POA: Diagnosis not present

## 2016-05-31 DIAGNOSIS — N186 End stage renal disease: Secondary | ICD-10-CM | POA: Diagnosis not present

## 2016-05-31 DIAGNOSIS — N2581 Secondary hyperparathyroidism of renal origin: Secondary | ICD-10-CM | POA: Diagnosis not present

## 2016-05-31 DIAGNOSIS — D631 Anemia in chronic kidney disease: Secondary | ICD-10-CM | POA: Diagnosis not present

## 2016-06-03 DIAGNOSIS — D631 Anemia in chronic kidney disease: Secondary | ICD-10-CM | POA: Diagnosis not present

## 2016-06-03 DIAGNOSIS — N186 End stage renal disease: Secondary | ICD-10-CM | POA: Diagnosis not present

## 2016-06-03 DIAGNOSIS — N2581 Secondary hyperparathyroidism of renal origin: Secondary | ICD-10-CM | POA: Diagnosis not present

## 2016-06-05 DIAGNOSIS — N186 End stage renal disease: Secondary | ICD-10-CM | POA: Diagnosis not present

## 2016-06-05 DIAGNOSIS — N2581 Secondary hyperparathyroidism of renal origin: Secondary | ICD-10-CM | POA: Diagnosis not present

## 2016-06-05 DIAGNOSIS — D631 Anemia in chronic kidney disease: Secondary | ICD-10-CM | POA: Diagnosis not present

## 2016-06-07 DIAGNOSIS — D631 Anemia in chronic kidney disease: Secondary | ICD-10-CM | POA: Diagnosis not present

## 2016-06-07 DIAGNOSIS — N2581 Secondary hyperparathyroidism of renal origin: Secondary | ICD-10-CM | POA: Diagnosis not present

## 2016-06-07 DIAGNOSIS — N186 End stage renal disease: Secondary | ICD-10-CM | POA: Diagnosis not present

## 2016-06-09 ENCOUNTER — Telehealth: Payer: Self-pay
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
## 2016-06-10 DIAGNOSIS — N2581 Secondary hyperparathyroidism of renal origin: Secondary | ICD-10-CM | POA: Diagnosis not present

## 2016-06-10 DIAGNOSIS — N186 End stage renal disease: Secondary | ICD-10-CM | POA: Diagnosis not present

## 2016-06-10 DIAGNOSIS — D631 Anemia in chronic kidney disease: Secondary | ICD-10-CM | POA: Diagnosis not present

## 2016-06-12 DIAGNOSIS — N2581 Secondary hyperparathyroidism of renal origin: Secondary | ICD-10-CM | POA: Diagnosis not present

## 2016-06-12 DIAGNOSIS — N186 End stage renal disease: Secondary | ICD-10-CM | POA: Diagnosis not present

## 2016-06-12 DIAGNOSIS — D631 Anemia in chronic kidney disease: Secondary | ICD-10-CM | POA: Diagnosis not present

## 2016-06-14 DIAGNOSIS — D631 Anemia in chronic kidney disease: Secondary | ICD-10-CM | POA: Diagnosis not present

## 2016-06-14 DIAGNOSIS — N2581 Secondary hyperparathyroidism of renal origin: Secondary | ICD-10-CM | POA: Diagnosis not present

## 2016-06-14 DIAGNOSIS — N186 End stage renal disease: Secondary | ICD-10-CM | POA: Diagnosis not present

## 2016-06-17 DIAGNOSIS — D631 Anemia in chronic kidney disease: Secondary | ICD-10-CM | POA: Diagnosis not present

## 2016-06-17 DIAGNOSIS — N186 End stage renal disease: Secondary | ICD-10-CM | POA: Diagnosis not present

## 2016-06-17 DIAGNOSIS — N2581 Secondary hyperparathyroidism of renal origin: Secondary | ICD-10-CM | POA: Diagnosis not present

## 2016-06-21 DIAGNOSIS — N186 End stage renal disease: Secondary | ICD-10-CM | POA: Diagnosis not present

## 2016-06-21 DIAGNOSIS — N2581 Secondary hyperparathyroidism of renal origin: Secondary | ICD-10-CM | POA: Diagnosis not present

## 2016-06-21 DIAGNOSIS — D631 Anemia in chronic kidney disease: Secondary | ICD-10-CM | POA: Diagnosis not present

## 2016-06-24 DIAGNOSIS — N186 End stage renal disease: Secondary | ICD-10-CM | POA: Diagnosis not present

## 2016-06-24 DIAGNOSIS — N2581 Secondary hyperparathyroidism of renal origin: Secondary | ICD-10-CM | POA: Diagnosis not present

## 2016-06-24 DIAGNOSIS — D631 Anemia in chronic kidney disease: Secondary | ICD-10-CM | POA: Diagnosis not present

## 2016-06-26 DIAGNOSIS — I120 Hypertensive chronic kidney disease with stage 5 chronic kidney disease or end stage renal disease: Secondary | ICD-10-CM | POA: Diagnosis not present

## 2016-06-26 DIAGNOSIS — N2581 Secondary hyperparathyroidism of renal origin: Secondary | ICD-10-CM | POA: Diagnosis not present

## 2016-06-26 DIAGNOSIS — Z992 Dependence on renal dialysis: Secondary | ICD-10-CM | POA: Diagnosis not present

## 2016-06-26 DIAGNOSIS — D631 Anemia in chronic kidney disease: Secondary | ICD-10-CM | POA: Diagnosis not present

## 2016-06-26 DIAGNOSIS — N186 End stage renal disease: Secondary | ICD-10-CM | POA: Diagnosis not present

## 2016-06-28 DIAGNOSIS — B999 Unspecified infectious disease: Secondary | ICD-10-CM | POA: Diagnosis not present

## 2016-06-28 DIAGNOSIS — N390 Urinary tract infection, site not specified: Secondary | ICD-10-CM | POA: Diagnosis not present

## 2016-06-28 DIAGNOSIS — D631 Anemia in chronic kidney disease: Secondary | ICD-10-CM | POA: Diagnosis not present

## 2016-06-28 DIAGNOSIS — N2581 Secondary hyperparathyroidism of renal origin: Secondary | ICD-10-CM | POA: Diagnosis not present

## 2016-06-28 DIAGNOSIS — N186 End stage renal disease: Secondary | ICD-10-CM | POA: Diagnosis not present

## 2016-07-01 DIAGNOSIS — N390 Urinary tract infection, site not specified: Secondary | ICD-10-CM | POA: Diagnosis not present

## 2016-07-01 DIAGNOSIS — N186 End stage renal disease: Secondary | ICD-10-CM | POA: Diagnosis not present

## 2016-07-01 DIAGNOSIS — B999 Unspecified infectious disease: Secondary | ICD-10-CM | POA: Diagnosis not present

## 2016-07-01 DIAGNOSIS — D631 Anemia in chronic kidney disease: Secondary | ICD-10-CM | POA: Diagnosis not present

## 2016-07-01 DIAGNOSIS — N2581 Secondary hyperparathyroidism of renal origin: Secondary | ICD-10-CM | POA: Diagnosis not present

## 2016-07-07 DIAGNOSIS — N2581 Secondary hyperparathyroidism of renal origin: Secondary | ICD-10-CM | POA: Diagnosis not present

## 2016-07-07 DIAGNOSIS — N186 End stage renal disease: Secondary | ICD-10-CM | POA: Diagnosis not present

## 2016-07-07 DIAGNOSIS — D631 Anemia in chronic kidney disease: Secondary | ICD-10-CM | POA: Diagnosis not present

## 2016-07-07 DIAGNOSIS — N390 Urinary tract infection, site not specified: Secondary | ICD-10-CM | POA: Diagnosis not present

## 2016-07-07 DIAGNOSIS — B999 Unspecified infectious disease: Secondary | ICD-10-CM | POA: Diagnosis not present

## 2016-07-10 DIAGNOSIS — B999 Unspecified infectious disease: Secondary | ICD-10-CM | POA: Diagnosis not present

## 2016-07-10 DIAGNOSIS — N186 End stage renal disease: Secondary | ICD-10-CM | POA: Diagnosis not present

## 2016-07-10 DIAGNOSIS — N390 Urinary tract infection, site not specified: Secondary | ICD-10-CM | POA: Diagnosis not present

## 2016-07-10 DIAGNOSIS — N2581 Secondary hyperparathyroidism of renal origin: Secondary | ICD-10-CM | POA: Diagnosis not present

## 2016-07-10 DIAGNOSIS — D631 Anemia in chronic kidney disease: Secondary | ICD-10-CM | POA: Diagnosis not present

## 2016-07-12 DIAGNOSIS — N186 End stage renal disease: Secondary | ICD-10-CM | POA: Diagnosis not present

## 2016-07-12 DIAGNOSIS — N2581 Secondary hyperparathyroidism of renal origin: Secondary | ICD-10-CM | POA: Diagnosis not present

## 2016-07-12 DIAGNOSIS — D631 Anemia in chronic kidney disease: Secondary | ICD-10-CM | POA: Diagnosis not present

## 2016-07-12 DIAGNOSIS — N390 Urinary tract infection, site not specified: Secondary | ICD-10-CM | POA: Diagnosis not present

## 2016-07-12 DIAGNOSIS — B999 Unspecified infectious disease: Secondary | ICD-10-CM | POA: Diagnosis not present

## 2016-07-15 DIAGNOSIS — N186 End stage renal disease: Secondary | ICD-10-CM | POA: Diagnosis not present

## 2016-07-15 DIAGNOSIS — D631 Anemia in chronic kidney disease: Secondary | ICD-10-CM | POA: Diagnosis not present

## 2016-07-15 DIAGNOSIS — B999 Unspecified infectious disease: Secondary | ICD-10-CM | POA: Diagnosis not present

## 2016-07-15 DIAGNOSIS — N2581 Secondary hyperparathyroidism of renal origin: Secondary | ICD-10-CM | POA: Diagnosis not present

## 2016-07-15 DIAGNOSIS — N390 Urinary tract infection, site not specified: Secondary | ICD-10-CM | POA: Diagnosis not present

## 2016-07-17 DIAGNOSIS — N2581 Secondary hyperparathyroidism of renal origin: Secondary | ICD-10-CM | POA: Diagnosis not present

## 2016-07-17 DIAGNOSIS — N390 Urinary tract infection, site not specified: Secondary | ICD-10-CM | POA: Diagnosis not present

## 2016-07-17 DIAGNOSIS — B999 Unspecified infectious disease: Secondary | ICD-10-CM | POA: Diagnosis not present

## 2016-07-17 DIAGNOSIS — N186 End stage renal disease: Secondary | ICD-10-CM | POA: Diagnosis not present

## 2016-07-17 DIAGNOSIS — D631 Anemia in chronic kidney disease: Secondary | ICD-10-CM | POA: Diagnosis not present

## 2016-07-19 DIAGNOSIS — N390 Urinary tract infection, site not specified: Secondary | ICD-10-CM | POA: Diagnosis not present

## 2016-07-19 DIAGNOSIS — N2581 Secondary hyperparathyroidism of renal origin: Secondary | ICD-10-CM | POA: Diagnosis not present

## 2016-07-19 DIAGNOSIS — D631 Anemia in chronic kidney disease: Secondary | ICD-10-CM | POA: Diagnosis not present

## 2016-07-19 DIAGNOSIS — N186 End stage renal disease: Secondary | ICD-10-CM | POA: Diagnosis not present

## 2016-07-19 DIAGNOSIS — B999 Unspecified infectious disease: Secondary | ICD-10-CM | POA: Diagnosis not present

## 2016-07-22 DIAGNOSIS — D631 Anemia in chronic kidney disease: Secondary | ICD-10-CM | POA: Diagnosis not present

## 2016-07-22 DIAGNOSIS — N2581 Secondary hyperparathyroidism of renal origin: Secondary | ICD-10-CM | POA: Diagnosis not present

## 2016-07-22 DIAGNOSIS — B999 Unspecified infectious disease: Secondary | ICD-10-CM | POA: Diagnosis not present

## 2016-07-22 DIAGNOSIS — N186 End stage renal disease: Secondary | ICD-10-CM | POA: Diagnosis not present

## 2016-07-22 DIAGNOSIS — N390 Urinary tract infection, site not specified: Secondary | ICD-10-CM | POA: Diagnosis not present

## 2016-07-23 ENCOUNTER — Emergency Department (HOSPITAL_COMMUNITY): Payer: Medicare Other

## 2016-07-23 ENCOUNTER — Emergency Department (HOSPITAL_COMMUNITY)
Admission: EM | Admit: 2016-07-23 | Discharge: 2016-07-23 | Disposition: A | Discharge status: Home / Self Care | Payer: Medicare Other | Attending: Emergency Medicine | Admitting: Emergency Medicine

## 2016-07-23 ENCOUNTER — Encounter (HOSPITAL_COMMUNITY): Payer: Self-pay | Admitting: Emergency Medicine

## 2016-07-23 DIAGNOSIS — Z91040 Latex allergy status: Secondary | ICD-10-CM | POA: Insufficient documentation

## 2016-07-23 DIAGNOSIS — N186 End stage renal disease: Secondary | ICD-10-CM | POA: Insufficient documentation

## 2016-07-23 DIAGNOSIS — Z79899 Other long term (current) drug therapy: Secondary | ICD-10-CM | POA: Insufficient documentation

## 2016-07-23 DIAGNOSIS — Z992 Dependence on renal dialysis: Secondary | ICD-10-CM | POA: Insufficient documentation

## 2016-07-23 DIAGNOSIS — J988 Other specified respiratory disorders: Secondary | ICD-10-CM | POA: Diagnosis not present

## 2016-07-23 DIAGNOSIS — I120 Hypertensive chronic kidney disease with stage 5 chronic kidney disease or end stage renal disease: Secondary | ICD-10-CM | POA: Diagnosis not present

## 2016-07-23 DIAGNOSIS — J069 Acute upper respiratory infection, unspecified: Secondary | ICD-10-CM | POA: Insufficient documentation

## 2016-07-23 DIAGNOSIS — N390 Urinary tract infection, site not specified: Secondary | ICD-10-CM | POA: Diagnosis not present

## 2016-07-23 DIAGNOSIS — R319 Hematuria, unspecified: Secondary | ICD-10-CM

## 2016-07-23 DIAGNOSIS — R51 Headache: Secondary | ICD-10-CM | POA: Insufficient documentation

## 2016-07-23 DIAGNOSIS — R079 Chest pain, unspecified: Secondary | ICD-10-CM | POA: Diagnosis not present

## 2016-07-23 DIAGNOSIS — M25521 Pain in right elbow: Secondary | ICD-10-CM | POA: Diagnosis not present

## 2016-07-23 DIAGNOSIS — R05 Cough: Secondary | ICD-10-CM | POA: Diagnosis present

## 2016-07-23 LAB — URINALYSIS, ROUTINE W REFLEX MICROSCOPIC
BILIRUBIN URINE: NEGATIVE
GLUCOSE, UA: NEGATIVE mg/dL
KETONES UR: NEGATIVE mg/dL
Nitrite: POSITIVE — AB
PH: 8.5 — AB (ref 5.0–8.0)
PROTEIN: 100 mg/dL — AB
Specific Gravity, Urine: 1.015 (ref 1.005–1.030)

## 2016-07-23 LAB — URINALYSIS, MICROSCOPIC (REFLEX)

## 2016-07-23 MED ORDER — TRAMADOL HCL 50 MG PO TABS
50.0000 mg | ORAL_TABLET | Freq: Once | ORAL | Status: AC
  Administered 2016-07-23: 50 mg via ORAL
  Filled 2016-07-23: qty 1

## 2016-07-23 MED ORDER — CEFTRIAXONE SODIUM 1 G IJ SOLR
1.0000 g | Freq: Once | INTRAMUSCULAR | Status: AC
  Administered 2016-07-23: 1 g via INTRAMUSCULAR
  Filled 2016-07-23: qty 10

## 2016-07-23 MED ORDER — ONDANSETRON 8 MG PO TBDP
8.0000 mg | ORAL_TABLET | Freq: Once | ORAL | Status: AC
  Administered 2016-07-23: 8 mg via ORAL
  Filled 2016-07-23: qty 1

## 2016-07-23 MED ORDER — LIDOCAINE HCL (PF) 1 % IJ SOLN
INTRAMUSCULAR | Status: AC
  Administered 2016-07-23: 2.1 mL
  Filled 2016-07-23: qty 5

## 2016-07-23 MED ORDER — OXYCODONE-ACETAMINOPHEN 5-325 MG PO TABS
1.0000 | ORAL_TABLET | Freq: Once | ORAL | Status: AC
  Administered 2016-07-23: 1 via ORAL
  Filled 2016-07-23: qty 1

## 2016-07-23 NOTE — Discharge Instructions (Signed)
As discussed,  follow up with your kidney doctor tomorrow. Show him your labs from tonight so he can continue your antibiotic treatment with your dialysis.

## 2016-07-23 NOTE — ED Provider Notes (Signed)
[facility] DEPT Provider Note   CSN: [id number] Arrival date & time: 07/23/16  1529     History   Chief Complaint Chief Complaint  Patient presents with  . Cough  . Nasal Congestion    HPI [name] is a 25 y.o. female with history as outlined below, most significant for spina bifida with atrophied lower extremities, wheelchair bound (also uses arms frequently for ambulation at home) and ESRD, last dialysis performed yesterday presenting with multiple complaints, the first being a persistent left frontal headache which radiates to her occipital area and intermittently present for several months. She denies a significant history of headache and denies injury. She has had no fevers, chills, syncope, confusion or localized weakness and denies vision changes or photophobia with these episodes.  She has found no alleviators.    Secondly, has had uri type symptoms for 2 days, which includes nasal congestion, sinus pressure with clear rhinorrhea and nonproductive cough, intermittent chest pain associated with cough.  Symptoms do not include shortness of breath,   vomiting or diarrhea but reports nausea since arrival.  She has pain that radiates into her right elbow.  She has been restricted from being able to ambulate using arms since this pain began.  She denies elbow injury but endorses having a new "clicking" sensation in the elbow with movement.   Also states had a 3 day course of antibiotics for a uti which she just completed and given at dialysis, does not know the name,  Still with cloudy appearing urine. Denies nausea, vomiting.  She self caths.   She has dialysis TTHS and has not missed any treatments.  The history is provided by the patient.    Past Medical History:  Diagnosis Date  . Anemia associated with chronic renal failure   . Blood transfusion   . 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  . Depression 05/14/2015  . Dialysis care   . ESRD (end  stage renal disease) on dialysis ([place])   . HTN (hypertension) 05/02/2011  . Spina bifida   . UTI (lower urinary tract infection)     Patient Active Problem List   Diagnosis Date Noted  . Pyelonephritis 07/31/2015  . Back pain 07/31/2015  . Abdominal pain 07/31/2015  . Constipation 07/31/2015  . Major depressive disorder, single episode, severe without psychotic features ([place]) 05/15/2015  . Spina bifida ([place]) 01/18/2012  . ESRD (end stage renal disease) on dialysis ([place]) 05/02/2011  . Anemia 05/02/2011  . HTN (hypertension) 05/02/2011    Past Surgical History:  Procedure Laterality Date  . AV FISTULA PLACEMENT     left arm    OB History    No data available       Home Medications    Prior to Admission medications   Medication Sig Start Date End Date Taking? Authorizing Provider  cinacalcet (SENSIPAR) 30 MG tablet Take 30 mg by mouth every other day.     Historical Provider, MD  diphenhydrAMINE (BENADRYL) 25 mg capsule Take 1 capsule (25 mg total) by mouth every 6 (six) hours as needed for itching. 08/03/15   Nita Sells, MD  Epoetin Alfa (EPOGEN IJ) Inject as directed. Pt gets on dialysis days which are Tuesday, Thursday, and Saturday.    Historical Provider, MD  gabapentin (NEURONTIN) 100 MG capsule Take 100 mg by mouth daily as needed (for pain).    Historical Provider, MD  metoCLOPramide (REGLAN) 10 MG tablet Take 1 tablet (10 mg total) by  mouth every 8 (eight) hours as needed for nausea or vomiting. 11/29/15   Clayton Bibles, PA-C  omeprazole (PRILOSEC) 20 MG capsule Take 20 mg by mouth daily. 03/26/16   Historical Provider, MD  oxyCODONE-acetaminophen (PERCOCET/ROXICET) 5-325 MG tablet Take 1 tablet by mouth every 6 (six) hours as needed for pain. 04/24/16   Historical Provider, MD  sevelamer carbonate (RENVELA) 800 MG tablet Take 800 mg by mouth 3 (three) times daily with meals.     Historical Provider, MD    Family History Family History  Problem Relation Age of  Onset  . Kidney cancer Other     Social History Social History  Substance Use Topics  . Smoking status: Never Smoker  . Smokeless tobacco: Never Used  . Alcohol use No     Allergies   Ciprofloxacin; Other; Peanut-containing drug products; Aleve [naproxen sodium]; Influenza vaccines; Tetanus toxoids; and Latex   Review of Systems Review of Systems   Physical Exam Updated Vital Signs BP 111/81 (BP Location: Right Arm)   Pulse 78   Temp 99.1 F (37.3 C) (Oral)   Resp 16   Wt 27.2 kg   SpO2 100%   BMI 73.24 kg/m   Physical Exam  Constitutional: She is oriented to person, place, and time. She appears well-developed and well-nourished.  Uncomfortable appearing  HENT:  Head: Normocephalic and atraumatic.  Right Ear: Tympanic membrane normal.  Left Ear: Tympanic membrane normal.  Nose: Mucosal edema and rhinorrhea present.  Mouth/Throat: Uvula is midline and oropharynx is clear and moist. No posterior oropharyngeal erythema or tonsillar abscesses.  Eyes: EOM are normal. Pupils are equal, round, and reactive to light.  Neck: Normal range of motion. Neck supple. Muscular tenderness present. No spinous process tenderness present. No neck rigidity.  Cardiovascular: Normal rate, regular rhythm and normal heart sounds.   Pulmonary/Chest: Effort normal. She has no decreased breath sounds. She has no wheezes. She has no rhonchi.  Abdominal: Soft. There is no tenderness.  Musculoskeletal:  Mild crepitus right elbow with full extension.  Equal grip strength.  No edema or erythema.   Lymphadenopathy:    She has no cervical adenopathy.  Neurological: She is alert and oriented to person, place, and time. She has normal strength. No sensory deficit. Gait normal. GCS eye subscore is 4. GCS verbal subscore is 5. GCS motor subscore is 6.  Reflex Scores:      Bicep reflexes are 2+ on the right side and 2+ on the left side. Equal grip strength. Lower extremities atrophied.  Skin: Skin is  warm and dry. No rash noted.  Psychiatric: She has a normal mood and affect. Her speech is normal and behavior is normal. Thought content normal. Cognition and memory are normal.  Nursing note and vitals reviewed.    ED Treatments / Results  Labs (all labs ordered are listed, but only abnormal results are displayed) Labs Reviewed  URINALYSIS, ROUTINE W REFLEX MICROSCOPIC - Abnormal; Notable for the following:       Result Value   Color, Urine STRAW (*)    APPearance HAZY (*)    pH 8.5 (*)    Hgb urine dipstick SMALL (*)    Protein, ur 100 (*)    Nitrite POSITIVE (*)    Leukocytes, UA LARGE (*)    All other components within normal limits  URINALYSIS, MICROSCOPIC (REFLEX) - Abnormal; Notable for the following:    Bacteria, UA MANY (*)    Squamous Epithelial / LPF 6-30 (*)  All other components within normal limits  URINE CULTURE    EKG  EKG Interpretation  Date/Time:  Wednesday July 23 2016 17:19:04 EST Ventricular Rate:  78 PR Interval:    QRS Duration: 76 QT Interval:  358 QTC Calculation: 408 R Axis:   51 Text Interpretation:  Duplicate Delete Confirmed by Florina Ou  MD, Jenny Reichmann ([id number]) on 07/24/2016 1:30:03 PM       Radiology Dg Chest 2 View  Result Date: 07/23/2016 CLINICAL DATA:  Chest pain EXAM: CHEST  2 VIEW COMPARISON:  July 31, 2015 FINDINGS: There is no edema or consolidation. Heart size and pulmonary vascularity are normal. No adenopathy. There is evidence of caudal regression. No pneumothorax. IMPRESSION: No edema or consolidation. Electronically Signed   By: Lowella Grip III M.D.   On: 07/23/2016 17:15   Dg Elbow Complete Right  Result Date: 07/23/2016 CLINICAL DATA:  Pain EXAM: RIGHT ELBOW - COMPLETE 3+ VIEW COMPARISON:  None. FINDINGS: Frontal, lateral, and bilateral oblique views were obtained. There is no fracture or dislocation. No joint effusion. The joint spaces appear normal. No erosive change. IMPRESSION: No fracture or dislocation.  No  evident arthropathy. Electronically Signed   By: Lowella Grip III M.D.   On: 07/23/2016 17:16   Ct Head Wo Contrast  Result Date: 07/23/2016 CLINICAL DATA:  Chronic headache EXAM: CT HEAD WITHOUT CONTRAST TECHNIQUE: Contiguous axial images were obtained from the base of the skull through the vertex without intravenous contrast. COMPARISON:  None. FINDINGS: Brain: The ventricles are normal in size and configuration. There is no intracranial mass, hemorrhage, extra-axial fluid collection, or midline shift. Gray-white compartments are normal. No acute infarct evident. Vascular: There is no hyperdense vessel. No vascular calcifications are evident. Skull: The bony calvarium appears intact. Sinuses/Orbits: Visualized paranasal sinuses are clear. Visualized orbits appear symmetric bilaterally. Other: Mastoid air cells are clear. IMPRESSION: Study within normal limits. Electronically Signed   By: Lowella Grip III M.D.   On: 07/23/2016 17:20    Procedures Procedures (including critical care time)  Medications Ordered in ED Medications  traMADol (ULTRAM) tablet 50 mg (50 mg Oral Given 07/23/16 1654)  ondansetron (ZOFRAN-ODT) disintegrating tablet 8 mg (8 mg Oral Given 07/23/16 1654)  cefTRIAXone (ROCEPHIN) injection 1 g (1 g Intramuscular Given 07/23/16 1946)  oxyCODONE-acetaminophen (PERCOCET/ROXICET) 5-325 MG per tablet 1 tablet (1 tablet Oral Given 07/23/16 1947)  lidocaine (PF) (XYLOCAINE) 1 % injection (2.1 mLs  Given 07/23/16 1947)     Initial Impression / Assessment and Plan / ED Course  I have reviewed the triage vital signs and the nursing notes.  Pertinent labs & imaging results that were available during my care of the patient were reviewed by me and considered in my medical decision making (see chart for details).  Clinical Course     Uti, uri sx, urine cx sent.  She was given IM rocephin here, advised to inform her MD at dialysis in the am that her infection persists, she  was given copy of this lab result to give him.  Other sx suggesting uri, no acute findings.  Prn f/u anticipated.  She states recently rehomed here in [place], was staying with mother in [place] but did not work out, currently back and forth between grandmothers and aunts home.  Interested in new local pcp, referrals given.  Final Clinical Impressions(s) / ED Diagnoses   Final diagnoses:  Viral upper respiratory tract infection  Urinary tract infection with hematuria, site unspecified    New Prescriptions Discharge Medication  List as of 07/23/2016  7:34 PM       Evalee Jefferson, PA-C 07/24/16 1433    Fredia Sorrow, MD 07/28/16 [phone]

## 2016-07-23 NOTE — ED Triage Notes (Signed)
Patient complains of sinus congestion and pain with pain from cough that started Monday.

## 2016-07-23 NOTE — ED Provider Notes (Signed)
Medical screening examination/treatment/procedure(s) were performed by non-physician practitioner and as supervising physician I was immediately available for consultation/collaboration.   EKG Interpretation  Date/Time:  Wednesday July 23 2016 17:19:04 EST Ventricular Rate:  78 PR Interval:    QRS Duration: 76 QT Interval:  358 QTC Calculation: 408 R Axis:   51 Text Interpretation:  Sinus rhythm Borderline short PR interval Baseline wander in lead(s) V3 V4 V5 No significant change since last tracing Confirmed by Alfonsa Vaile  MD, [place] [phone]) on 07/23/2016 5:24:14 PM        Fredia Sorrow, MD 07/23/16 Vern[facility]

## 2016-07-24 DIAGNOSIS — N2581 Secondary hyperparathyroidism of renal origin: Secondary | ICD-10-CM | POA: Diagnosis not present

## 2016-07-24 DIAGNOSIS — B999 Unspecified infectious disease: Secondary | ICD-10-CM | POA: Diagnosis not present

## 2016-07-24 DIAGNOSIS — D631 Anemia in chronic kidney disease: Secondary | ICD-10-CM | POA: Diagnosis not present

## 2016-07-24 DIAGNOSIS — N390 Urinary tract infection, site not specified: Secondary | ICD-10-CM | POA: Diagnosis not present

## 2016-07-24 DIAGNOSIS — N186 End stage renal disease: Secondary | ICD-10-CM | POA: Diagnosis not present

## 2016-07-25 LAB — URINE CULTURE

## 2016-07-26 DIAGNOSIS — B999 Unspecified infectious disease: Secondary | ICD-10-CM | POA: Diagnosis not present

## 2016-07-26 DIAGNOSIS — N390 Urinary tract infection, site not specified: Secondary | ICD-10-CM | POA: Diagnosis not present

## 2016-07-26 DIAGNOSIS — D631 Anemia in chronic kidney disease: Secondary | ICD-10-CM | POA: Diagnosis not present

## 2016-07-26 DIAGNOSIS — N2581 Secondary hyperparathyroidism of renal origin: Secondary | ICD-10-CM | POA: Diagnosis not present

## 2016-07-26 DIAGNOSIS — N186 End stage renal disease: Secondary | ICD-10-CM | POA: Diagnosis not present

## 2016-07-27 DIAGNOSIS — N186 End stage renal disease: Secondary | ICD-10-CM | POA: Diagnosis not present

## 2016-07-27 DIAGNOSIS — I120 Hypertensive chronic kidney disease with stage 5 chronic kidney disease or end stage renal disease: Secondary | ICD-10-CM | POA: Diagnosis not present

## 2016-07-27 DIAGNOSIS — Z992 Dependence on renal dialysis: Secondary | ICD-10-CM | POA: Diagnosis not present

## 2016-07-29 DIAGNOSIS — N390 Urinary tract infection, site not specified: Secondary | ICD-10-CM | POA: Diagnosis not present

## 2016-07-29 DIAGNOSIS — N2581 Secondary hyperparathyroidism of renal origin: Secondary | ICD-10-CM | POA: Diagnosis not present

## 2016-07-29 DIAGNOSIS — B999 Unspecified infectious disease: Secondary | ICD-10-CM | POA: Diagnosis not present

## 2016-07-29 DIAGNOSIS — D631 Anemia in chronic kidney disease: Secondary | ICD-10-CM | POA: Diagnosis not present

## 2016-07-29 DIAGNOSIS — N186 End stage renal disease: Secondary | ICD-10-CM | POA: Diagnosis not present

## 2016-07-31 DIAGNOSIS — B999 Unspecified infectious disease: Secondary | ICD-10-CM | POA: Diagnosis not present

## 2016-07-31 DIAGNOSIS — N186 End stage renal disease: Secondary | ICD-10-CM | POA: Diagnosis not present

## 2016-07-31 DIAGNOSIS — N390 Urinary tract infection, site not specified: Secondary | ICD-10-CM | POA: Diagnosis not present

## 2016-07-31 DIAGNOSIS — N2581 Secondary hyperparathyroidism of renal origin: Secondary | ICD-10-CM | POA: Diagnosis not present

## 2016-07-31 DIAGNOSIS — D631 Anemia in chronic kidney disease: Secondary | ICD-10-CM | POA: Diagnosis not present

## 2016-08-02 DIAGNOSIS — N2581 Secondary hyperparathyroidism of renal origin: Secondary | ICD-10-CM | POA: Diagnosis not present

## 2016-08-02 DIAGNOSIS — N390 Urinary tract infection, site not specified: Secondary | ICD-10-CM | POA: Diagnosis not present

## 2016-08-02 DIAGNOSIS — B999 Unspecified infectious disease: Secondary | ICD-10-CM | POA: Diagnosis not present

## 2016-08-02 DIAGNOSIS — N186 End stage renal disease: Secondary | ICD-10-CM | POA: Diagnosis not present

## 2016-08-02 DIAGNOSIS — D631 Anemia in chronic kidney disease: Secondary | ICD-10-CM | POA: Diagnosis not present

## 2016-08-06 DIAGNOSIS — N2581 Secondary hyperparathyroidism of renal origin: Secondary | ICD-10-CM | POA: Diagnosis not present

## 2016-08-06 DIAGNOSIS — N186 End stage renal disease: Secondary | ICD-10-CM | POA: Diagnosis not present

## 2016-08-06 DIAGNOSIS — B999 Unspecified infectious disease: Secondary | ICD-10-CM | POA: Diagnosis not present

## 2016-08-06 DIAGNOSIS — N390 Urinary tract infection, site not specified: Secondary | ICD-10-CM | POA: Diagnosis not present

## 2016-08-06 DIAGNOSIS — D631 Anemia in chronic kidney disease: Secondary | ICD-10-CM | POA: Diagnosis not present

## 2016-08-07 DIAGNOSIS — N186 End stage renal disease: Secondary | ICD-10-CM | POA: Diagnosis not present

## 2016-08-07 DIAGNOSIS — N390 Urinary tract infection, site not specified: Secondary | ICD-10-CM | POA: Diagnosis not present

## 2016-08-07 DIAGNOSIS — B999 Unspecified infectious disease: Secondary | ICD-10-CM | POA: Diagnosis not present

## 2016-08-07 DIAGNOSIS — D631 Anemia in chronic kidney disease: Secondary | ICD-10-CM | POA: Diagnosis not present

## 2016-08-07 DIAGNOSIS — N2581 Secondary hyperparathyroidism of renal origin: Secondary | ICD-10-CM | POA: Diagnosis not present

## 2016-08-09 DIAGNOSIS — D631 Anemia in chronic kidney disease: Secondary | ICD-10-CM | POA: Diagnosis not present

## 2016-08-09 DIAGNOSIS — B999 Unspecified infectious disease: Secondary | ICD-10-CM | POA: Diagnosis not present

## 2016-08-09 DIAGNOSIS — N186 End stage renal disease: Secondary | ICD-10-CM | POA: Diagnosis not present

## 2016-08-09 DIAGNOSIS — N390 Urinary tract infection, site not specified: Secondary | ICD-10-CM | POA: Diagnosis not present

## 2016-08-09 DIAGNOSIS — N2581 Secondary hyperparathyroidism of renal origin: Secondary | ICD-10-CM | POA: Diagnosis not present

## 2016-08-12 DIAGNOSIS — D631 Anemia in chronic kidney disease: Secondary | ICD-10-CM | POA: Diagnosis not present

## 2016-08-12 DIAGNOSIS — N186 End stage renal disease: Secondary | ICD-10-CM | POA: Diagnosis not present

## 2016-08-12 DIAGNOSIS — N390 Urinary tract infection, site not specified: Secondary | ICD-10-CM | POA: Diagnosis not present

## 2016-08-12 DIAGNOSIS — N2581 Secondary hyperparathyroidism of renal origin: Secondary | ICD-10-CM | POA: Diagnosis not present

## 2016-08-12 DIAGNOSIS — B999 Unspecified infectious disease: Secondary | ICD-10-CM | POA: Diagnosis not present

## 2016-08-15 DIAGNOSIS — N2581 Secondary hyperparathyroidism of renal origin: Secondary | ICD-10-CM | POA: Diagnosis not present

## 2016-08-15 DIAGNOSIS — N390 Urinary tract infection, site not specified: Secondary | ICD-10-CM | POA: Diagnosis not present

## 2016-08-15 DIAGNOSIS — D631 Anemia in chronic kidney disease: Secondary | ICD-10-CM | POA: Diagnosis not present

## 2016-08-15 DIAGNOSIS — N186 End stage renal disease: Secondary | ICD-10-CM | POA: Diagnosis not present

## 2016-08-15 DIAGNOSIS — B999 Unspecified infectious disease: Secondary | ICD-10-CM | POA: Diagnosis not present

## 2016-08-19 DIAGNOSIS — N186 End stage renal disease: Secondary | ICD-10-CM | POA: Diagnosis not present

## 2016-08-19 DIAGNOSIS — B999 Unspecified infectious disease: Secondary | ICD-10-CM | POA: Diagnosis not present

## 2016-08-19 DIAGNOSIS — D631 Anemia in chronic kidney disease: Secondary | ICD-10-CM | POA: Diagnosis not present

## 2016-08-19 DIAGNOSIS — N2581 Secondary hyperparathyroidism of renal origin: Secondary | ICD-10-CM | POA: Diagnosis not present

## 2016-08-19 DIAGNOSIS — N390 Urinary tract infection, site not specified: Secondary | ICD-10-CM | POA: Diagnosis not present

## 2016-08-21 DIAGNOSIS — N2581 Secondary hyperparathyroidism of renal origin: Secondary | ICD-10-CM | POA: Diagnosis not present

## 2016-08-21 DIAGNOSIS — N390 Urinary tract infection, site not specified: Secondary | ICD-10-CM | POA: Diagnosis not present

## 2016-08-21 DIAGNOSIS — D631 Anemia in chronic kidney disease: Secondary | ICD-10-CM | POA: Diagnosis not present

## 2016-08-21 DIAGNOSIS — N186 End stage renal disease: Secondary | ICD-10-CM | POA: Diagnosis not present

## 2016-08-21 DIAGNOSIS — B999 Unspecified infectious disease: Secondary | ICD-10-CM | POA: Diagnosis not present

## 2016-08-23 DIAGNOSIS — N2581 Secondary hyperparathyroidism of renal origin: Secondary | ICD-10-CM | POA: Diagnosis not present

## 2016-08-23 DIAGNOSIS — D631 Anemia in chronic kidney disease: Secondary | ICD-10-CM | POA: Diagnosis not present

## 2016-08-23 DIAGNOSIS — N186 End stage renal disease: Secondary | ICD-10-CM | POA: Diagnosis not present

## 2016-08-23 DIAGNOSIS — B999 Unspecified infectious disease: Secondary | ICD-10-CM | POA: Diagnosis not present

## 2016-08-23 DIAGNOSIS — N390 Urinary tract infection, site not specified: Secondary | ICD-10-CM | POA: Diagnosis not present

## 2016-08-26 DIAGNOSIS — B999 Unspecified infectious disease: Secondary | ICD-10-CM | POA: Diagnosis not present

## 2016-08-26 DIAGNOSIS — N2581 Secondary hyperparathyroidism of renal origin: Secondary | ICD-10-CM | POA: Diagnosis not present

## 2016-08-26 DIAGNOSIS — N186 End stage renal disease: Secondary | ICD-10-CM | POA: Diagnosis not present

## 2016-08-26 DIAGNOSIS — D631 Anemia in chronic kidney disease: Secondary | ICD-10-CM | POA: Diagnosis not present

## 2016-08-26 DIAGNOSIS — N390 Urinary tract infection, site not specified: Secondary | ICD-10-CM | POA: Diagnosis not present

## 2016-08-27 DIAGNOSIS — Z992 Dependence on renal dialysis: Secondary | ICD-10-CM | POA: Diagnosis not present

## 2016-08-27 DIAGNOSIS — N186 End stage renal disease: Secondary | ICD-10-CM | POA: Diagnosis not present

## 2016-08-27 DIAGNOSIS — I120 Hypertensive chronic kidney disease with stage 5 chronic kidney disease or end stage renal disease: Secondary | ICD-10-CM | POA: Diagnosis not present

## 2016-08-28 DIAGNOSIS — N2581 Secondary hyperparathyroidism of renal origin: Secondary | ICD-10-CM | POA: Diagnosis not present

## 2016-08-28 DIAGNOSIS — N186 End stage renal disease: Secondary | ICD-10-CM | POA: Diagnosis not present

## 2016-08-28 DIAGNOSIS — N390 Urinary tract infection, site not specified: Secondary | ICD-10-CM | POA: Diagnosis not present

## 2016-08-30 DIAGNOSIS — N390 Urinary tract infection, site not specified: Secondary | ICD-10-CM | POA: Diagnosis not present

## 2016-08-30 DIAGNOSIS — N2581 Secondary hyperparathyroidism of renal origin: Secondary | ICD-10-CM | POA: Diagnosis not present

## 2016-08-30 DIAGNOSIS — N186 End stage renal disease: Secondary | ICD-10-CM | POA: Diagnosis not present

## 2016-09-02 DIAGNOSIS — N390 Urinary tract infection, site not specified: Secondary | ICD-10-CM | POA: Diagnosis not present

## 2016-09-02 DIAGNOSIS — N186 End stage renal disease: Secondary | ICD-10-CM | POA: Diagnosis not present

## 2016-09-02 DIAGNOSIS — N2581 Secondary hyperparathyroidism of renal origin: Secondary | ICD-10-CM | POA: Diagnosis not present

## 2016-09-04 DIAGNOSIS — N186 End stage renal disease: Secondary | ICD-10-CM | POA: Diagnosis not present

## 2016-09-04 DIAGNOSIS — N390 Urinary tract infection, site not specified: Secondary | ICD-10-CM | POA: Diagnosis not present

## 2016-09-04 DIAGNOSIS — N2581 Secondary hyperparathyroidism of renal origin: Secondary | ICD-10-CM | POA: Diagnosis not present

## 2016-09-06 DIAGNOSIS — N2581 Secondary hyperparathyroidism of renal origin: Secondary | ICD-10-CM | POA: Diagnosis not present

## 2016-09-06 DIAGNOSIS — N390 Urinary tract infection, site not specified: Secondary | ICD-10-CM | POA: Diagnosis not present

## 2016-09-06 DIAGNOSIS — N186 End stage renal disease: Secondary | ICD-10-CM | POA: Diagnosis not present

## 2016-09-09 DIAGNOSIS — N2581 Secondary hyperparathyroidism of renal origin: Secondary | ICD-10-CM | POA: Diagnosis not present

## 2016-09-09 DIAGNOSIS — N186 End stage renal disease: Secondary | ICD-10-CM | POA: Diagnosis not present

## 2016-09-09 DIAGNOSIS — N390 Urinary tract infection, site not specified: Secondary | ICD-10-CM | POA: Diagnosis not present

## 2016-09-11 DIAGNOSIS — E221 Hyperprolactinemia: Secondary | ICD-10-CM | POA: Diagnosis not present

## 2016-09-11 DIAGNOSIS — N390 Urinary tract infection, site not specified: Secondary | ICD-10-CM | POA: Diagnosis not present

## 2016-09-11 DIAGNOSIS — Z992 Dependence on renal dialysis: Secondary | ICD-10-CM | POA: Diagnosis not present

## 2016-09-11 DIAGNOSIS — E782 Mixed hyperlipidemia: Secondary | ICD-10-CM | POA: Diagnosis not present

## 2016-09-11 DIAGNOSIS — F330 Major depressive disorder, recurrent, mild: Secondary | ICD-10-CM | POA: Diagnosis not present

## 2016-09-11 DIAGNOSIS — Q760 Spina bifida occulta: Secondary | ICD-10-CM | POA: Diagnosis not present

## 2016-09-11 DIAGNOSIS — N2581 Secondary hyperparathyroidism of renal origin: Secondary | ICD-10-CM | POA: Diagnosis not present

## 2016-09-11 DIAGNOSIS — N186 End stage renal disease: Secondary | ICD-10-CM | POA: Diagnosis not present

## 2016-09-13 DIAGNOSIS — N186 End stage renal disease: Secondary | ICD-10-CM | POA: Diagnosis not present

## 2016-09-13 DIAGNOSIS — N390 Urinary tract infection, site not specified: Secondary | ICD-10-CM | POA: Diagnosis not present

## 2016-09-13 DIAGNOSIS — N2581 Secondary hyperparathyroidism of renal origin: Secondary | ICD-10-CM | POA: Diagnosis not present

## 2016-09-15 ENCOUNTER — Emergency Department (HOSPITAL_COMMUNITY)
Admission: EM | Admit: 2016-09-15 | Discharge: 2016-09-16 | Disposition: A | Discharge status: Home / Self Care | Payer: Medicare Other | Attending: Emergency Medicine | Admitting: Emergency Medicine

## 2016-09-15 ENCOUNTER — Encounter (HOSPITAL_COMMUNITY): Payer: Self-pay | Admitting: Emergency Medicine

## 2016-09-15 ENCOUNTER — Emergency Department (HOSPITAL_COMMUNITY): Payer: Medicare Other

## 2016-09-15 DIAGNOSIS — N390 Urinary tract infection, site not specified: Secondary | ICD-10-CM | POA: Insufficient documentation

## 2016-09-15 DIAGNOSIS — Z992 Dependence on renal dialysis: Secondary | ICD-10-CM | POA: Insufficient documentation

## 2016-09-15 DIAGNOSIS — R109 Unspecified abdominal pain: Secondary | ICD-10-CM

## 2016-09-15 DIAGNOSIS — I120 Hypertensive chronic kidney disease with stage 5 chronic kidney disease or end stage renal disease: Secondary | ICD-10-CM | POA: Diagnosis not present

## 2016-09-15 DIAGNOSIS — K5649 Other impaction of intestine: Secondary | ICD-10-CM | POA: Diagnosis not present

## 2016-09-15 DIAGNOSIS — N186 End stage renal disease: Secondary | ICD-10-CM | POA: Diagnosis not present

## 2016-09-15 DIAGNOSIS — Z79899 Other long term (current) drug therapy: Secondary | ICD-10-CM | POA: Diagnosis not present

## 2016-09-15 DIAGNOSIS — R1084 Generalized abdominal pain: Secondary | ICD-10-CM | POA: Diagnosis not present

## 2016-09-15 LAB — LIPASE, BLOOD: Lipase: 40 U/L (ref 11–51)

## 2016-09-15 LAB — URINALYSIS, ROUTINE W REFLEX MICROSCOPIC
Bilirubin Urine: NEGATIVE
Glucose, UA: NEGATIVE mg/dL
Ketones, ur: NEGATIVE mg/dL
Nitrite: NEGATIVE
PH: 8 (ref 5.0–8.0)
Protein, ur: 100 mg/dL — AB
SPECIFIC GRAVITY, URINE: 1.008 (ref 1.005–1.030)

## 2016-09-15 LAB — CBC
HEMATOCRIT: 33.4 % — AB (ref 36.0–46.0)
Hemoglobin: 10.9 g/dL — ABNORMAL LOW (ref 12.0–15.0)
MCH: 29.9 pg (ref 26.0–34.0)
MCHC: 32.6 g/dL (ref 30.0–36.0)
MCV: 91.8 fL (ref 78.0–100.0)
Platelets: 229 10*3/uL (ref 150–400)
RBC: 3.64 MIL/uL — ABNORMAL LOW (ref 3.87–5.11)
RDW: 14.2 % (ref 11.5–15.5)
WBC: 4.2 10*3/uL (ref 4.0–10.5)

## 2016-09-15 LAB — COMPREHENSIVE METABOLIC PANEL
ALBUMIN: 3.5 g/dL (ref 3.5–5.0)
ALT: 9 U/L — ABNORMAL LOW (ref 14–54)
AST: 17 U/L (ref 15–41)
Alkaline Phosphatase: 80 U/L (ref 38–126)
Anion gap: 12 (ref 5–15)
BUN: 52 mg/dL — AB (ref 6–20)
CHLORIDE: 107 mmol/L (ref 101–111)
CO2: 20 mmol/L — ABNORMAL LOW (ref 22–32)
Calcium: 7.7 mg/dL — ABNORMAL LOW (ref 8.9–10.3)
Creatinine, Ser: 6.83 mg/dL — ABNORMAL HIGH (ref 0.44–1.00)
GFR calc Af Amer: 9 mL/min — ABNORMAL LOW (ref >60)
GFR calc non Af Amer: 8 mL/min — ABNORMAL LOW (ref >60)
GLUCOSE: 91 mg/dL (ref 65–99)
POTASSIUM: 3.4 mmol/L — AB (ref 3.5–5.1)
Sodium: 139 mmol/L (ref 135–145)
Total Bilirubin: 0.3 mg/dL (ref 0.3–1.2)
Total Protein: 7.3 g/dL (ref 6.5–8.1)

## 2016-09-15 LAB — LACTIC ACID, PLASMA: Lactic Acid, Venous: 0.9 mmol/L (ref 0.5–1.9)

## 2016-09-15 MED ORDER — VANCOMYCIN HCL IN DEXTROSE 1-5 GM/200ML-% IV SOLN
1000.0000 mg | Freq: Once | INTRAVENOUS | Status: AC
  Administered 2016-09-15: 1000 mg via INTRAVENOUS
  Filled 2016-09-15: qty 200

## 2016-09-15 MED ORDER — OXYCODONE-ACETAMINOPHEN 5-325 MG PO TABS
1.0000 | ORAL_TABLET | Freq: Once | ORAL | Status: AC
  Administered 2016-09-15: 1 via ORAL
  Filled 2016-09-15: qty 1

## 2016-09-15 MED ORDER — ONDANSETRON HCL 4 MG PO TABS
4.0000 mg | ORAL_TABLET | Freq: Once | ORAL | Status: AC
  Administered 2016-09-15: 4 mg via ORAL
  Filled 2016-09-15: qty 1

## 2016-09-15 MED ORDER — DEXTROSE 5 % IV SOLN: 2.0000 g | Freq: Once | INTRAVENOUS | Status: DC

## 2016-09-15 MED ORDER — PIPERACILLIN-TAZOBACTAM 3.375 G IVPB
3.3750 g | Freq: Once | INTRAVENOUS | Status: AC
  Administered 2016-09-15: 3.375 g via INTRAVENOUS
  Filled 2016-09-15: qty 50

## 2016-09-15 NOTE — ED Triage Notes (Signed)
Patient c/o right side flank pain that radiates into right lower abd. Per patient nausea, vomiting, diarrhea, and fevers. Patient reports cloudy urine. Denies any blood in urine, dysuria, or frequent urination but states prone to UTIs. Patient also c/o headache that started yesterday. Patient reports taking tylenol with last little relief-last dose of tylenol 2pm.

## 2016-09-15 NOTE — ED Provider Notes (Addendum)
[facility] DEPT Provider Note   CSN: [id number] Arrival date & time: 09/15/16  1807     History   Chief Complaint Chief Complaint  Patient presents with  . Flank Pain    HPI [name] is a 26 y.o. female.  HPI  26 year old female history of spina bifida, end-stage renal dialysis, on dialysis Tuesday Thursday Saturday presents today complaining of 2 days of flank pain, subjective fever, chills, and nausea. She states she has a history of frequent urinary tract infections. His not have control of her bowel or bladder. She denies use of congestion, sore throat, cough  Past Medical History:  Diagnosis Date  . Anemia associated with chronic renal failure   . Blood transfusion   . 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  . Depression 05/14/2015  . Dialysis care   . ESRD (end stage renal disease) on dialysis ([place])   . HTN (hypertension) 05/02/2011  . Spina bifida   . UTI (lower urinary tract infection)     Patient Active Problem List   Diagnosis Date Noted  . Pyelonephritis 07/31/2015  . Back pain 07/31/2015  . Abdominal pain 07/31/2015  . Constipation 07/31/2015  . Major depressive disorder, single episode, severe without psychotic features ([place]) 05/15/2015  . Spina bifida ([place]) 01/18/2012  . ESRD (end stage renal disease) on dialysis ([place]) 05/02/2011  . Anemia 05/02/2011  . HTN (hypertension) 05/02/2011    Past Surgical History:  Procedure Laterality Date  . AV FISTULA PLACEMENT     left arm    OB History    No data available       Home Medications    Prior to Admission medications   Medication Sig Start Date End Date Taking? Authorizing Provider  cinacalcet (SENSIPAR) 30 MG tablet Take 30 mg by mouth every other day.     Historical Provider, MD  diphenhydrAMINE (BENADRYL) 25 mg capsule Take 1 capsule (25 mg total) by mouth every 6 (six) hours as needed for itching. 08/03/15   Nita Sells, MD  Epoetin Alfa (EPOGEN IJ) Inject  as directed. Pt gets on dialysis days which are Tuesday, Thursday, and Saturday.    Historical Provider, MD  gabapentin (NEURONTIN) 100 MG capsule Take 100 mg by mouth daily as needed (for pain).    Historical Provider, MD  metoCLOPramide (REGLAN) 10 MG tablet Take 1 tablet (10 mg total) by mouth every 8 (eight) hours as needed for nausea or vomiting. 11/29/15   Clayton Bibles, PA-C  omeprazole (PRILOSEC) 20 MG capsule Take 20 mg by mouth daily. 03/26/16   Historical Provider, MD  oxyCODONE-acetaminophen (PERCOCET/ROXICET) 5-325 MG tablet Take 1 tablet by mouth every 6 (six) hours as needed for pain. 04/24/16   Historical Provider, MD  sevelamer carbonate (RENVELA) 800 MG tablet Take 800 mg by mouth 3 (three) times daily with meals.     Historical Provider, MD    Family History Family History  Problem Relation Age of Onset  . Kidney cancer Other     Social History Social History  Substance Use Topics  . Smoking status: Never Smoker  . Smokeless tobacco: Never Used  . Alcohol use No     Allergies   Ciprofloxacin; Other; Peanut-containing drug products; Aleve [naproxen sodium]; Ceftriaxone; Influenza vaccines; Tetanus toxoids; and Latex   Review of Systems Review of Systems  All other systems reviewed and are negative.    Physical Exam Updated Vital Signs BP 125/81 (BP Location: Left Arm)   Pulse  108   Temp 99.5 F (37.5 C) (Oral)   Resp 18   Wt 25.9 kg   SpO2 100%   BMI 69.58 kg/m   Physical Exam  Constitutional: She is oriented to person, place, and time. No distress.  Patient with by habitus with spina bifida and atrophy with contractures of bilateral lower extremities  HENT:  Head: Normocephalic and atraumatic.  Right Ear: External ear normal.  Left Ear: External ear normal.  Mouth/Throat: Oropharynx is clear and moist.  Eyes: EOM are normal. Pupils are equal, round, and reactive to light.  Neck: Normal range of motion. Neck supple.  Cardiovascular: Tachycardia  present.   Pulmonary/Chest: Effort normal.  Abdominal:  Firm with diffuse tenderness to palpation  Musculoskeletal: She exhibits no tenderness.  Fistula with thrill left upper extremity without redness, warmth, or tenderness Bilateral lower extremities atrophied and contractured No skin breakdown noted  Neurological: She is alert and oriented to person, place, and time.  Skin: Skin is warm and dry. Capillary refill takes less than 2 seconds.  Psychiatric: She has a normal mood and affect.  Nursing note and vitals reviewed.    ED Treatments / Results  Labs (all labs ordered are listed, but only abnormal results are displayed) Labs Reviewed  COMPREHENSIVE METABOLIC PANEL - Abnormal; Notable for the following:       Result Value   Potassium 3.4 (*)    CO2 20 (*)    BUN 52 (*)    Creatinine, Ser 6.83 (*)    Calcium 7.7 (*)    ALT 9 (*)    GFR calc non Af Amer 8 (*)    GFR calc Af Amer 9 (*)    All other components within normal limits  CBC - Abnormal; Notable for the following:    RBC 3.64 (*)    Hemoglobin 10.9 (*)    HCT 33.4 (*)    All other components within normal limits  LIPASE, BLOOD  URINALYSIS, ROUTINE W REFLEX MICROSCOPIC    EKG  EKG Interpretation None       Radiology No results found.  Procedures Procedures (including critical care time)  Medications Ordered in ED Medications - No data to display   Initial Impression / Assessment and Plan / ED Course  I have reviewed the triage vital signs and the nursing notes.  Pertinent labs & imaging results that were available during my care of the patient were reviewed by me and considered in my medical decision making (see chart for details).   26 y.o. Female esrd, on dialysis, spina bifida, horseshoe kidney with flank pain, urinary retention and probable uti.  Plan zosyn/vanc pending urinalysis and blood cultures if indicated.  Patient with fecal impaction will require disimpaction.  Signed out to [employer] and Dr. Oleta Mouse.  [name] will disimpact.  Nursing to obtain blood cultures if rectal temp >100 after disimpaction.  Patient with multiple reactions to antibiotics.  She has received rocephin previously but states she gets blisters in mouth and itches all over.     Final Clinical Impressions(s) / ED Diagnoses   Final diagnoses:  Impaction, bowel ([place])  Urinary tract infection without hematuria, site unspecified  Right flank pain    New Prescriptions New Prescriptions   No medications on file     Pattricia Boss, MD 09/15/16 2223    Pattricia Boss, MD 09/16/16 1007

## 2016-09-15 NOTE — ED Triage Notes (Signed)
Patient in waiting room behind triage crying at this time. Complaining of new onset of chest pain. EKG to be done.

## 2016-09-16 DIAGNOSIS — N390 Urinary tract infection, site not specified: Secondary | ICD-10-CM | POA: Diagnosis not present

## 2016-09-16 MED ORDER — FAMOTIDINE 20 MG PO TABS
20.0000 mg | ORAL_TABLET | Freq: Once | ORAL | Status: AC
  Administered 2016-09-16: 20 mg via ORAL
  Filled 2016-09-16: qty 1

## 2016-09-16 MED ORDER — DIPHENHYDRAMINE HCL 50 MG/ML IJ SOLN
INTRAMUSCULAR | Status: AC
  Filled 2016-09-16: qty 1

## 2016-09-16 MED ORDER — FOSFOMYCIN TROMETHAMINE 3 G PO PACK: PACK | ORAL | 0 refills | Status: DC

## 2016-09-16 MED ORDER — HYDROXYZINE PAMOATE 25 MG PO CAPS: 25.0000 mg | ORAL_CAPSULE | Freq: Three times a day (TID) | ORAL | 0 refills | Status: DC | PRN

## 2016-09-16 MED ORDER — DIPHENHYDRAMINE HCL 50 MG/ML IJ SOLN
12.5000 mg | Freq: Once | INTRAMUSCULAR | Status: AC
  Administered 2016-09-16: 12.5 mg via INTRAVENOUS

## 2016-09-16 MED ORDER — HYDROXYZINE HCL 25 MG PO TABS
25.0000 mg | ORAL_TABLET | Freq: Once | ORAL | Status: AC
  Administered 2016-09-16: 25 mg via ORAL
  Filled 2016-09-16: qty 1

## 2016-09-16 NOTE — Discharge Instructions (Signed)
Your exam suggest UTI and bowel impaction. Please use stool softener of your choice. Use fosfomycin today and repeat in 3 days for urinary tract infection. Please have your urine rechecked by your dialysis team in 7 days. Use vistaril for itching if needed. This medication may cause drowsiness.

## 2016-09-16 NOTE — ED Notes (Signed)
Called to pt's room because pt was c/o itching on face and arms and one hive noted at R wrist area. IV Vancomycin infusing. Stopped infusion and notified Lily Kocher, PA-C. Orders for Benadryl given and carried out as well as to resume Vancomycin.

## 2016-09-16 NOTE — ED Provider Notes (Signed)
Care continued from Dr. Jeanell Sparrow.  BOWEL DISIMPACTION. I discussed the procedure with the patient in terms which he understands. The patient gives permission for the disimpaction. The patient identified by arm band. Chaperone present during the disimpaction. The patient was disimpacted with removal of moderate amount of stool. Patient tolerated the procedure without any major problem or complication.  Rectal temperature 98.1.  The lipase is normal at 40. The comprehensive metabolic panel shows the potassium be slightly low at 3.4, the BUN and creatinine are elevated as expected as the patient is end-stage renal patient. The complete blood count shows the white blood cells to be 4200, there is no shift to the left. Hemoglobin is 10.9, hematocrit is 33.3. The lactic acid is 0.9. The urinalysis shows a turbid yellow specimen with a specific gravity 1.008. There is a small amount of hemoglobin present. There is a moderate leukocyte esterase noted. There are too many to count white blood cells. Many bacteria noted and some bacteria and clumps present. A culture has been sent to the lab.  The CT scan was reviewed. The patient has an large thick walled gallbladder consistent with neurogenic bladder. 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 No stones noted in the ureters. There is a large impacted feces noted in the rectosigmoid colon. There are findings of dysraphism consistent with spina bifida.  During the administration of the vancomycin and the Zosyn, the patient developed itching. IV Benadryl 12.5 mg was given, but the patient states this did not help at all. Oral Vistaril and Pepcid also been given.  Recheck. Itching improved, but not completely resolved. I discussed the laboratory findings and CT findings with the patient in terms which he understood. We discussed the use of a stool softener.  I discussed the plan with her to use the  fosfomycin for her, as most of the other medications that her urine has been sensitive to are in IV form. The patient will be given a prescription for the Vistaril to use for continued itching. I've asked her to coordinate these medications with her dialysis MD. Pt is in agreement with this plan.   Lily Kocher, PA-C 09/16/16 [id number]    Pattricia Boss, MD 09/22/16 [phone]

## 2016-09-18 DIAGNOSIS — N186 End stage renal disease: Secondary | ICD-10-CM | POA: Diagnosis not present

## 2016-09-18 DIAGNOSIS — N390 Urinary tract infection, site not specified: Secondary | ICD-10-CM | POA: Diagnosis not present

## 2016-09-18 DIAGNOSIS — N2581 Secondary hyperparathyroidism of renal origin: Secondary | ICD-10-CM | POA: Diagnosis not present

## 2016-09-20 DIAGNOSIS — N2581 Secondary hyperparathyroidism of renal origin: Secondary | ICD-10-CM | POA: Diagnosis not present

## 2016-09-20 DIAGNOSIS — N186 End stage renal disease: Secondary | ICD-10-CM | POA: Diagnosis not present

## 2016-09-20 DIAGNOSIS — N390 Urinary tract infection, site not specified: Secondary | ICD-10-CM | POA: Diagnosis not present

## 2016-09-20 LAB — CULTURE, BLOOD (ROUTINE X 2)
CULTURE: NO GROWTH
Culture: NO GROWTH

## 2016-09-23 DIAGNOSIS — N2581 Secondary hyperparathyroidism of renal origin: Secondary | ICD-10-CM | POA: Diagnosis not present

## 2016-09-23 DIAGNOSIS — N390 Urinary tract infection, site not specified: Secondary | ICD-10-CM | POA: Diagnosis not present

## 2016-09-23 DIAGNOSIS — N186 End stage renal disease: Secondary | ICD-10-CM | POA: Diagnosis not present

## 2016-09-24 DIAGNOSIS — N186 End stage renal disease: Secondary | ICD-10-CM | POA: Diagnosis not present

## 2016-09-24 DIAGNOSIS — I120 Hypertensive chronic kidney disease with stage 5 chronic kidney disease or end stage renal disease: Secondary | ICD-10-CM | POA: Diagnosis not present

## 2016-09-24 DIAGNOSIS — Z992 Dependence on renal dialysis: Secondary | ICD-10-CM | POA: Diagnosis not present

## 2016-09-25 ENCOUNTER — Encounter: Payer: Self-pay | Admitting: *Deleted

## 2016-09-25 DIAGNOSIS — N2581 Secondary hyperparathyroidism of renal origin: Secondary | ICD-10-CM | POA: Diagnosis not present

## 2016-09-25 DIAGNOSIS — D631 Anemia in chronic kidney disease: Secondary | ICD-10-CM | POA: Diagnosis not present

## 2016-09-25 DIAGNOSIS — N186 End stage renal disease: Secondary | ICD-10-CM | POA: Diagnosis not present

## 2016-09-27 DIAGNOSIS — N186 End stage renal disease: Secondary | ICD-10-CM | POA: Diagnosis not present

## 2016-09-27 DIAGNOSIS — N2581 Secondary hyperparathyroidism of renal origin: Secondary | ICD-10-CM | POA: Diagnosis not present

## 2016-09-27 DIAGNOSIS — D631 Anemia in chronic kidney disease: Secondary | ICD-10-CM | POA: Diagnosis not present

## 2016-09-30 DIAGNOSIS — D631 Anemia in chronic kidney disease: Secondary | ICD-10-CM | POA: Diagnosis not present

## 2016-09-30 DIAGNOSIS — N186 End stage renal disease: Secondary | ICD-10-CM | POA: Diagnosis not present

## 2016-09-30 DIAGNOSIS — N2581 Secondary hyperparathyroidism of renal origin: Secondary | ICD-10-CM | POA: Diagnosis not present

## 2016-10-02 DIAGNOSIS — D631 Anemia in chronic kidney disease: Secondary | ICD-10-CM | POA: Diagnosis not present

## 2016-10-02 DIAGNOSIS — N2581 Secondary hyperparathyroidism of renal origin: Secondary | ICD-10-CM | POA: Diagnosis not present

## 2016-10-02 DIAGNOSIS — N186 End stage renal disease: Secondary | ICD-10-CM | POA: Diagnosis not present

## 2016-10-04 DIAGNOSIS — D631 Anemia in chronic kidney disease: Secondary | ICD-10-CM | POA: Diagnosis not present

## 2016-10-04 DIAGNOSIS — N2581 Secondary hyperparathyroidism of renal origin: Secondary | ICD-10-CM | POA: Diagnosis not present

## 2016-10-04 DIAGNOSIS — N186 End stage renal disease: Secondary | ICD-10-CM | POA: Diagnosis not present

## 2016-10-06 ENCOUNTER — Encounter: Payer: Medicare Other | Admitting: Obstetrics and Gynecology

## 2016-10-09 DIAGNOSIS — N2581 Secondary hyperparathyroidism of renal origin: Secondary | ICD-10-CM | POA: Diagnosis not present

## 2016-10-09 DIAGNOSIS — N186 End stage renal disease: Secondary | ICD-10-CM | POA: Diagnosis not present

## 2016-10-09 DIAGNOSIS — D631 Anemia in chronic kidney disease: Secondary | ICD-10-CM | POA: Diagnosis not present

## 2016-10-11 DIAGNOSIS — D631 Anemia in chronic kidney disease: Secondary | ICD-10-CM | POA: Diagnosis not present

## 2016-10-11 DIAGNOSIS — N2581 Secondary hyperparathyroidism of renal origin: Secondary | ICD-10-CM | POA: Diagnosis not present

## 2016-10-11 DIAGNOSIS — N186 End stage renal disease: Secondary | ICD-10-CM | POA: Diagnosis not present

## 2016-10-14 DIAGNOSIS — D631 Anemia in chronic kidney disease: Secondary | ICD-10-CM | POA: Diagnosis not present

## 2016-10-14 DIAGNOSIS — N2581 Secondary hyperparathyroidism of renal origin: Secondary | ICD-10-CM | POA: Diagnosis not present

## 2016-10-14 DIAGNOSIS — N186 End stage renal disease: Secondary | ICD-10-CM | POA: Diagnosis not present

## 2016-10-16 DIAGNOSIS — N2581 Secondary hyperparathyroidism of renal origin: Secondary | ICD-10-CM | POA: Diagnosis not present

## 2016-10-16 DIAGNOSIS — N186 End stage renal disease: Secondary | ICD-10-CM | POA: Diagnosis not present

## 2016-10-16 DIAGNOSIS — D631 Anemia in chronic kidney disease: Secondary | ICD-10-CM | POA: Diagnosis not present

## 2016-10-18 DIAGNOSIS — N2581 Secondary hyperparathyroidism of renal origin: Secondary | ICD-10-CM | POA: Diagnosis not present

## 2016-10-18 DIAGNOSIS — D631 Anemia in chronic kidney disease: Secondary | ICD-10-CM | POA: Diagnosis not present

## 2016-10-18 DIAGNOSIS — N186 End stage renal disease: Secondary | ICD-10-CM | POA: Diagnosis not present

## 2016-10-20 ENCOUNTER — Encounter: Payer: Medicare Other | Admitting: Obstetrics and Gynecology

## 2016-10-23 DIAGNOSIS — N186 End stage renal disease: Secondary | ICD-10-CM | POA: Diagnosis not present

## 2016-10-23 DIAGNOSIS — D631 Anemia in chronic kidney disease: Secondary | ICD-10-CM | POA: Diagnosis not present

## 2016-10-23 DIAGNOSIS — N2581 Secondary hyperparathyroidism of renal origin: Secondary | ICD-10-CM | POA: Diagnosis not present

## 2016-10-25 DIAGNOSIS — D631 Anemia in chronic kidney disease: Secondary | ICD-10-CM | POA: Diagnosis not present

## 2016-10-25 DIAGNOSIS — N186 End stage renal disease: Secondary | ICD-10-CM | POA: Diagnosis not present

## 2016-10-25 DIAGNOSIS — N2581 Secondary hyperparathyroidism of renal origin: Secondary | ICD-10-CM | POA: Diagnosis not present

## 2016-10-28 DIAGNOSIS — R300 Dysuria: Secondary | ICD-10-CM | POA: Diagnosis not present

## 2016-10-28 DIAGNOSIS — N2581 Secondary hyperparathyroidism of renal origin: Secondary | ICD-10-CM | POA: Diagnosis not present

## 2016-10-28 DIAGNOSIS — N390 Urinary tract infection, site not specified: Secondary | ICD-10-CM | POA: Diagnosis not present

## 2016-10-28 DIAGNOSIS — N186 End stage renal disease: Secondary | ICD-10-CM | POA: Diagnosis not present

## 2016-10-30 DIAGNOSIS — N390 Urinary tract infection, site not specified: Secondary | ICD-10-CM | POA: Diagnosis not present

## 2016-10-30 DIAGNOSIS — N2581 Secondary hyperparathyroidism of renal origin: Secondary | ICD-10-CM | POA: Diagnosis not present

## 2016-10-30 DIAGNOSIS — R300 Dysuria: Secondary | ICD-10-CM | POA: Diagnosis not present

## 2016-10-30 DIAGNOSIS — N186 End stage renal disease: Secondary | ICD-10-CM | POA: Diagnosis not present

## 2016-11-03 DIAGNOSIS — M25511 Pain in right shoulder: Secondary | ICD-10-CM | POA: Diagnosis not present

## 2016-11-04 DIAGNOSIS — R300 Dysuria: Secondary | ICD-10-CM | POA: Diagnosis not present

## 2016-11-04 DIAGNOSIS — N390 Urinary tract infection, site not specified: Secondary | ICD-10-CM | POA: Diagnosis not present

## 2016-11-04 DIAGNOSIS — N2581 Secondary hyperparathyroidism of renal origin: Secondary | ICD-10-CM | POA: Diagnosis not present

## 2016-11-04 DIAGNOSIS — N186 End stage renal disease: Secondary | ICD-10-CM | POA: Diagnosis not present

## 2016-11-05 ENCOUNTER — Other Ambulatory Visit (HOSPITAL_COMMUNITY): Payer: Self-pay | Admitting: Family Medicine

## 2016-11-05 ENCOUNTER — Encounter: Payer: Medicare Other | Admitting: Obstetrics and Gynecology

## 2016-11-05 ENCOUNTER — Ambulatory Visit (HOSPITAL_COMMUNITY)
Admission: RE | Admit: 2016-11-05 | Discharge: 2016-11-05 | Disposition: A | Discharge status: Home / Self Care | Payer: Medicare Other | Source: Ambulatory Visit | Attending: Family Medicine | Admitting: Family Medicine

## 2016-11-05 DIAGNOSIS — W19XXXA Unspecified fall, initial encounter: Secondary | ICD-10-CM

## 2016-11-05 DIAGNOSIS — S4991XA Unspecified injury of right shoulder and upper arm, initial encounter: Secondary | ICD-10-CM | POA: Diagnosis not present

## 2016-11-05 DIAGNOSIS — M79601 Pain in right arm: Secondary | ICD-10-CM | POA: Diagnosis not present

## 2016-11-05 DIAGNOSIS — W050XXA Fall from non-moving wheelchair, initial encounter: Secondary | ICD-10-CM | POA: Insufficient documentation

## 2016-11-05 DIAGNOSIS — R0781 Pleurodynia: Secondary | ICD-10-CM | POA: Diagnosis not present

## 2016-11-05 DIAGNOSIS — S299XXA Unspecified injury of thorax, initial encounter: Secondary | ICD-10-CM | POA: Diagnosis not present

## 2016-11-06 DIAGNOSIS — N2581 Secondary hyperparathyroidism of renal origin: Secondary | ICD-10-CM | POA: Diagnosis not present

## 2016-11-06 DIAGNOSIS — R300 Dysuria: Secondary | ICD-10-CM | POA: Diagnosis not present

## 2016-11-06 DIAGNOSIS — N186 End stage renal disease: Secondary | ICD-10-CM | POA: Diagnosis not present

## 2016-11-06 DIAGNOSIS — N390 Urinary tract infection, site not specified: Secondary | ICD-10-CM | POA: Diagnosis not present

## 2016-11-08 DIAGNOSIS — N2581 Secondary hyperparathyroidism of renal origin: Secondary | ICD-10-CM | POA: Diagnosis not present

## 2016-11-08 DIAGNOSIS — R300 Dysuria: Secondary | ICD-10-CM | POA: Diagnosis not present

## 2016-11-08 DIAGNOSIS — N390 Urinary tract infection, site not specified: Secondary | ICD-10-CM | POA: Diagnosis not present

## 2016-11-08 DIAGNOSIS — N186 End stage renal disease: Secondary | ICD-10-CM | POA: Diagnosis not present

## 2016-11-11 DIAGNOSIS — N2581 Secondary hyperparathyroidism of renal origin: Secondary | ICD-10-CM | POA: Diagnosis not present

## 2016-11-11 DIAGNOSIS — N186 End stage renal disease: Secondary | ICD-10-CM | POA: Diagnosis not present

## 2016-11-11 DIAGNOSIS — R300 Dysuria: Secondary | ICD-10-CM | POA: Diagnosis not present

## 2016-11-11 DIAGNOSIS — N390 Urinary tract infection, site not specified: Secondary | ICD-10-CM | POA: Diagnosis not present

## 2016-11-13 DIAGNOSIS — R300 Dysuria: Secondary | ICD-10-CM | POA: Diagnosis not present

## 2016-11-13 DIAGNOSIS — N2581 Secondary hyperparathyroidism of renal origin: Secondary | ICD-10-CM | POA: Diagnosis not present

## 2016-11-13 DIAGNOSIS — N390 Urinary tract infection, site not specified: Secondary | ICD-10-CM | POA: Diagnosis not present

## 2016-11-13 DIAGNOSIS — N186 End stage renal disease: Secondary | ICD-10-CM | POA: Diagnosis not present

## 2016-11-15 DIAGNOSIS — N390 Urinary tract infection, site not specified: Secondary | ICD-10-CM | POA: Diagnosis not present

## 2016-11-15 DIAGNOSIS — N2581 Secondary hyperparathyroidism of renal origin: Secondary | ICD-10-CM | POA: Diagnosis not present

## 2016-11-15 DIAGNOSIS — R300 Dysuria: Secondary | ICD-10-CM | POA: Diagnosis not present

## 2016-11-15 DIAGNOSIS — N186 End stage renal disease: Secondary | ICD-10-CM | POA: Diagnosis not present

## 2016-11-17 ENCOUNTER — Encounter: Payer: Self-pay | Admitting: Obstetrics & Gynecology

## 2016-11-17 ENCOUNTER — Ambulatory Visit (INDEPENDENT_AMBULATORY_CARE_PROVIDER_SITE_OTHER): Payer: Medicare Other | Admitting: Obstetrics & Gynecology

## 2016-11-17 VITALS — BP 130/82 | HR 115

## 2016-11-17 DIAGNOSIS — Z8742 Personal history of other diseases of the female genital tract: Secondary | ICD-10-CM | POA: Diagnosis not present

## 2016-11-17 DIAGNOSIS — Q7649 Other congenital malformations of spine, not associated with scoliosis: Secondary | ICD-10-CM

## 2016-11-17 NOTE — Progress Notes (Signed)
Chief Complaint  Patient presents with  . conception    Blood pressure 130/82, pulse (!) 115.  26 y.o. G0P0000 No LMP recorded. Patient is not currently having periods (Reason: Other). The current method of family planning is none.  Outpatient Encounter Prescriptions as of 11/17/2016  Medication Sig  . cinacalcet (SENSIPAR) 30 MG tablet Take 30 mg by mouth every other day.   . diphenhydrAMINE (BENADRYL) 25 mg capsule Take 1 capsule (25 mg total) by mouth every 6 (six) hours as needed for itching.  Marland Kitchen Epoetin Alfa (EPOGEN IJ) Inject as directed. Pt gets on dialysis days which are Tuesday, Thursday, and Saturday.  . fosfomycin (MONUROL) 3 g PACK Take one dose (3 grams) today and repeat in 3 days. (Patient not taking: Reported on 12/05/2016)  . omeprazole (PRILOSEC) 20 MG capsule Take 20 mg by mouth daily.  . sevelamer carbonate (RENVELA) 800 MG tablet Take 800 mg by mouth 3 (three) times daily with meals.   . hydrOXYzine (VISTARIL) 25 MG capsule Take 1 capsule (25 mg total) by mouth 3 (three) times daily as needed for itching. (Patient not taking: Reported on 11/17/2016)  . multivitamin (RENA-VIT) TABS tablet Take 1 tablet by mouth every Tuesday, Thursday, and Saturday at 6 PM.   No facility-administered encounter medications on file as of 11/17/2016.     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 History of gynecologic disorder V13.29  Z87.42     Established relevant diagnosis(es):   Plan/Recommendations: No orders of the defined types were placed in this encounter.   Labs or Scans Ordered: No orders of the defined types were placed in this encounter.   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        Face to face time:  20 minutes  Greater than 50% of the visit time was spent in counseling and coordination of care with the patient.  The summary and outline of the counseling and care coordination is summarized in the note above.   All questions were answered.  Past Medical History:  Diagnosis Date  . Anemia associated with chronic renal failure   . Blood transfusion   . 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  . Depression 05/14/2015  . Dialysis care   . ESRD (end stage renal disease) on dialysis ([place])   . HTN (hypertension) 05/02/2011  . Spina bifida   . UTI (lower urinary tract infection)     Past Surgical History:  Procedure Laterality Date  . AV FISTULA PLACEMENT     left arm    OB History    Gravida Para Term Preterm AB Living   0 0 0 0 0 0   SAB TAB Ectopic Multiple Live Births   0 0 0 0 0  Allergies  Allergen Reactions  . Ciprofloxacin Shortness Of Breath, Nausea And Vomiting and Other (See Comments)    HIGH FEVER, oral blisters   . Other Anaphylaxis    Revaclear dialzer  . Peanut-Containing Drug Products Anaphylaxis  . Aleve [Naproxen Sodium] Other (See Comments)    G.I.Bleed  . Ceftriaxone Other (See Comments)    Blisters in mouth   . Coconut Oil Hives  . Influenza Vaccines Nausea And Vomiting    High fever  . Tetanus Toxoids Nausea And Vomiting    HIGH FEVER  . Latex Itching and Rash    Social History   Social History  . Marital status: Single    Spouse name: N/A  . Number of children: N/A  . Years of  education: N/A   Social History Main Topics  . Smoking status: Current Every Day Smoker  . Smokeless tobacco: Never Used     Comment: 2 cigs a day  . Alcohol use No  . Drug use: No  . Sexual activity: No   Other Topics Concern  . None   Social History Narrative  . None    Family History  Problem Relation Age of Onset  . Kidney cancer Other   . Hypertension Maternal Grandmother   . Arthritis Maternal Grandmother

## 2016-11-18 DIAGNOSIS — R300 Dysuria: Secondary | ICD-10-CM | POA: Diagnosis not present

## 2016-11-18 DIAGNOSIS — N186 End stage renal disease: Secondary | ICD-10-CM | POA: Diagnosis not present

## 2016-11-18 DIAGNOSIS — N2581 Secondary hyperparathyroidism of renal origin: Secondary | ICD-10-CM | POA: Diagnosis not present

## 2016-11-18 DIAGNOSIS — N390 Urinary tract infection, site not specified: Secondary | ICD-10-CM | POA: Diagnosis not present

## 2016-11-19 DIAGNOSIS — M25511 Pain in right shoulder: Secondary | ICD-10-CM | POA: Diagnosis not present

## 2016-11-20 ENCOUNTER — Encounter: Payer: Self-pay | Admitting: Gastroenterology

## 2016-11-20 DIAGNOSIS — N390 Urinary tract infection, site not specified: Secondary | ICD-10-CM | POA: Diagnosis not present

## 2016-11-20 DIAGNOSIS — R300 Dysuria: Secondary | ICD-10-CM | POA: Diagnosis not present

## 2016-11-20 DIAGNOSIS — N2581 Secondary hyperparathyroidism of renal origin: Secondary | ICD-10-CM | POA: Diagnosis not present

## 2016-11-20 DIAGNOSIS — N186 End stage renal disease: Secondary | ICD-10-CM | POA: Diagnosis not present

## 2016-11-22 DIAGNOSIS — N186 End stage renal disease: Secondary | ICD-10-CM | POA: Diagnosis not present

## 2016-11-22 DIAGNOSIS — R300 Dysuria: Secondary | ICD-10-CM | POA: Diagnosis not present

## 2016-11-22 DIAGNOSIS — N390 Urinary tract infection, site not specified: Secondary | ICD-10-CM | POA: Diagnosis not present

## 2016-11-22 DIAGNOSIS — N2581 Secondary hyperparathyroidism of renal origin: Secondary | ICD-10-CM | POA: Diagnosis not present

## 2016-11-24 DIAGNOSIS — N186 End stage renal disease: Secondary | ICD-10-CM | POA: Diagnosis not present

## 2016-11-24 DIAGNOSIS — Z992 Dependence on renal dialysis: Secondary | ICD-10-CM | POA: Diagnosis not present

## 2016-11-24 DIAGNOSIS — I120 Hypertensive chronic kidney disease with stage 5 chronic kidney disease or end stage renal disease: Secondary | ICD-10-CM | POA: Diagnosis not present

## 2016-11-25 DIAGNOSIS — N2581 Secondary hyperparathyroidism of renal origin: Secondary | ICD-10-CM | POA: Diagnosis not present

## 2016-11-25 DIAGNOSIS — D631 Anemia in chronic kidney disease: Secondary | ICD-10-CM | POA: Diagnosis not present

## 2016-11-25 DIAGNOSIS — N186 End stage renal disease: Secondary | ICD-10-CM | POA: Diagnosis not present

## 2016-11-25 DIAGNOSIS — N390 Urinary tract infection, site not specified: Secondary | ICD-10-CM | POA: Diagnosis not present

## 2016-11-25 DIAGNOSIS — R300 Dysuria: Secondary | ICD-10-CM | POA: Diagnosis not present

## 2016-11-27 DIAGNOSIS — N186 End stage renal disease: Secondary | ICD-10-CM | POA: Diagnosis not present

## 2016-11-27 DIAGNOSIS — R300 Dysuria: Secondary | ICD-10-CM | POA: Diagnosis not present

## 2016-11-27 DIAGNOSIS — D631 Anemia in chronic kidney disease: Secondary | ICD-10-CM | POA: Diagnosis not present

## 2016-11-27 DIAGNOSIS — N390 Urinary tract infection, site not specified: Secondary | ICD-10-CM | POA: Diagnosis not present

## 2016-11-27 DIAGNOSIS — N2581 Secondary hyperparathyroidism of renal origin: Secondary | ICD-10-CM | POA: Diagnosis not present

## 2016-11-29 DIAGNOSIS — N2581 Secondary hyperparathyroidism of renal origin: Secondary | ICD-10-CM | POA: Diagnosis not present

## 2016-11-29 DIAGNOSIS — N186 End stage renal disease: Secondary | ICD-10-CM | POA: Diagnosis not present

## 2016-11-29 DIAGNOSIS — N390 Urinary tract infection, site not specified: Secondary | ICD-10-CM | POA: Diagnosis not present

## 2016-11-29 DIAGNOSIS — R300 Dysuria: Secondary | ICD-10-CM | POA: Diagnosis not present

## 2016-11-29 DIAGNOSIS — D631 Anemia in chronic kidney disease: Secondary | ICD-10-CM | POA: Diagnosis not present

## 2016-12-02 DIAGNOSIS — N390 Urinary tract infection, site not specified: Secondary | ICD-10-CM | POA: Diagnosis not present

## 2016-12-02 DIAGNOSIS — R300 Dysuria: Secondary | ICD-10-CM | POA: Diagnosis not present

## 2016-12-02 DIAGNOSIS — N186 End stage renal disease: Secondary | ICD-10-CM | POA: Diagnosis not present

## 2016-12-02 DIAGNOSIS — N2581 Secondary hyperparathyroidism of renal origin: Secondary | ICD-10-CM | POA: Diagnosis not present

## 2016-12-02 DIAGNOSIS — D631 Anemia in chronic kidney disease: Secondary | ICD-10-CM | POA: Diagnosis not present

## 2016-12-04 DIAGNOSIS — N186 End stage renal disease: Secondary | ICD-10-CM | POA: Diagnosis not present

## 2016-12-04 DIAGNOSIS — R300 Dysuria: Secondary | ICD-10-CM | POA: Diagnosis not present

## 2016-12-04 DIAGNOSIS — N2581 Secondary hyperparathyroidism of renal origin: Secondary | ICD-10-CM | POA: Diagnosis not present

## 2016-12-04 DIAGNOSIS — D631 Anemia in chronic kidney disease: Secondary | ICD-10-CM | POA: Diagnosis not present

## 2016-12-04 DIAGNOSIS — N390 Urinary tract infection, site not specified: Secondary | ICD-10-CM | POA: Diagnosis not present

## 2016-12-05 ENCOUNTER — Ambulatory Visit (HOSPITAL_COMMUNITY): Payer: Medicare Other | Attending: Family Medicine

## 2016-12-05 DIAGNOSIS — M25511 Pain in right shoulder: Secondary | ICD-10-CM | POA: Insufficient documentation

## 2016-12-05 DIAGNOSIS — R29898 Other symptoms and signs involving the musculoskeletal system: Secondary | ICD-10-CM | POA: Insufficient documentation

## 2016-12-05 DIAGNOSIS — M25611 Stiffness of right shoulder, not elsewhere classified: Secondary | ICD-10-CM | POA: Insufficient documentation

## 2016-12-05 NOTE — Therapy (Signed)
[place], [place], [address] Phone: [phone]   Fax:  [phone]  Occupational Therapy Evaluation  Patient Details  Name: [name] MRN: [id number] Date of Birth: 1990/09/10 Referring Provider: Lucianne Lei MD  Encounter Date: 12/05/2016      OT End of Session - 12/05/16 1435    Visit Number 1   Number of Visits 8   Date for OT Re-Evaluation 01/04/17   Authorization Type 1) Medicare 2) Medicaid   Authorization Time Period before 10th visit   Authorization - Visit Number 1   Authorization - Number of Visits 10   OT Start Time 1035   OT Stop Time 1115   OT Time Calculation (min) 40 min   Activity Tolerance Patient tolerated treatment well   Behavior During Therapy [facility] for tasks assessed/performed      Past Medical History:  Diagnosis Date  . Anemia associated with chronic renal failure   . Blood transfusion   . 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  . Depression 05/14/2015  . Dialysis care   . ESRD (end stage renal disease) on dialysis ([place])   . HTN (hypertension) 05/02/2011  . Spina bifida   . UTI (lower urinary tract infection)     Past Surgical History:  Procedure Laterality Date  . AV FISTULA PLACEMENT     left arm    There were no vitals filed for this visit.      Subjective Assessment - 12/05/16 1041    Subjective  S: I fell getting out of the car. My wheelchair moved.    Pertinent History Patient is a 26 y/o female S/P right shoulder pain which was sustained from a mechanical fall when transferring out of the car to her wheelchair approximately 3 weeks ago. Patient has been wheelchair bound since she was 26 years old and has been using a manual wheelchair due to [place]. Dr. Criss Rosales has referred patient to occupational therapy for evaluation and treatment.    Special Tests FOTO score: 32/100   Patient Stated Goals Increase ROM and less/no pain   Currently in  Pain? Yes   Pain Score 7    Pain Location Shoulder   Pain Orientation Right   Pain Descriptors / Indicators Sore   Pain Radiating Towards up to the neck and back   Pain Onset 1 to 4 weeks ago   Pain Frequency Occasional   Aggravating Factors  Movement and use   Pain Relieving Factors Rest   Effect of Pain on Daily Activities Pt pushes through the pain   Multiple Pain Sites No           [facility] OT Assessment - 12/05/16 1042      Assessment   Diagnosis Right shoulder pain   Referring Provider Lucianne Lei MD   Onset Date --  3 weeks ago   Prior Therapy Patient has not received any therapy services for this problem.     Precautions   Precautions Other (comment)   Precaution Comments Pt is wheelchair bound.      Restrictions   Weight Bearing Restrictions No     Balance Screen   Has the patient fallen in the past 6 months Yes   How many times? 1   Has the patient had a decrease in activity level because of a fear of falling?  No   Is the patient reluctant to leave their home because of a fear  of falling?  No     Home  Environment   Family/patient expects to be discharged to: Private residence   Living Arrangements Other relatives  Aunt     Prior Function   Level of Independence Requires assistive device for independence   Leisure Painting     ADL   ADL comments Patient reports increased difficulty with wheelchair mobility due to arm pain     Mobility   Mobility Status Needs assist  Wheelchair bound.     Written Expression   Dominant Hand Right     Vision - History   Baseline Vision No visual deficits     Cognition   Overall Cognitive Status Within Functional Limits for tasks assessed     ROM / Strength   AROM / PROM / Strength AROM;PROM;Strength     Palpation   Palpation comment Mod fascial restrictions in right anterior deltoid, trapezius, and scapularis region.     AROM   Overall AROM Comments Patient assessed seated. IR/er abducted   AROM Assessment  Site Shoulder   Right/Left Shoulder Right   Right Shoulder Flexion 139 Degrees   Right Shoulder ABduction 140 Degrees   Right Shoulder Internal Rotation 30 Degrees   Right Shoulder External Rotation 90 Degrees     PROM   Overall PROM  Within functional limits for tasks performed   PROM Assessment Site Shoulder     Strength   Overall Strength Comments Assessed seated. IR/er abducted   Strength Assessment Site Shoulder   Right/Left Shoulder Right   Right Shoulder Flexion 4+/5   Right Shoulder ABduction 5/5   Right Shoulder Internal Rotation 4+/5   Right Shoulder External Rotation 5/5                         OT Education - 12/05/16 1434    Education provided Yes   Education Details scapular A/ROM and shoulder A/ROM exercises. Self myofascial release with ball.   Person(s) Educated Patient   Methods Explanation;Demonstration;Verbal cues;Handout   Comprehension Returned demonstration;Verbalized understanding          OT Short Term Goals - 12/05/16 1438      OT SHORT TERM GOAL #1   Title Patient will be educated and indepdent with HEP to increase functional mobility of RUE.   Time 4   Period Weeks   Status New     OT SHORT TERM GOAL #2   Title Patient will increase A/ROM of RUE to WNL to increase ability to reachi up overhead for items.    Time 4   Period Weeks   Status New     OT SHORT TERM GOAL #3   Title Patient will increase RUE strength to 5/5 to increase ability to seld propell wheelchair with less pain.    Time 4   Period Weeks   Status New     OT SHORT TERM GOAL #4   Title Patient will decrease pain level to 2/10 when using RUE.    Time 4   Period Weeks   Status New     OT SHORT TERM GOAL #5   Title Patient will decrease fascial restrictions to min amount or less to increase functional mobility of RUE.    Time 4   Period Weeks   Status New                  Plan - 12/05/16 1436    Clinical Impression Statement A: Pt is  a  26 y/o female S/P right shoulder pain causing increased fascial restrictions, pain and decreased ROM and strength resulting in difficulty completing daily tasks with Right UE.   Rehab Potential Excellent   OT Frequency 2x / week   OT Duration 4 weeks   OT Treatment/Interventions Self-care/ADL training;Cryotherapy;Electrical Stimulation;Moist Heat;Passive range of motion;Therapeutic activities;DME and/or AE instruction;Iontophoresis;Therapeutic exercises;Manual Therapy;Ultrasound;Patient/family education   Plan P: Patient will benefit from skilled OT services to increase functional performance during daily tasks using RUE. Treatment Plan: myofascial release, passive stretching, A/ROM, shoulder and scapular strengthening to increase posture.    OT Home Exercise Plan 5/11: scapular and shoulder A/ROM exercises   Consulted and Agree with Plan of Care Patient      Patient will benefit from skilled therapeutic intervention in order to improve the following deficits and impairments:  Decreased strength, Pain, Decreased range of motion, Decreased coordination, Impaired UE functional use, Increased fascial restricitons  Visit Diagnosis: Acute pain of right shoulder - Plan: Ot plan of care cert/re-cert  Other symptoms and signs involving the musculoskeletal system - Plan: Ot plan of care cert/re-cert  Stiffness of right shoulder, not elsewhere classified - Plan: Ot plan of care cert/re-cert      G-Codes - 31/28/11 1440    Functional Assessment Tool Used (Outpatient only) FOTO score: 32/100 (68% impaired)   Functional Limitation Self care   Self Care Current Status (W8677) At least 60 percent but less than 80 percent impaired, limited or restricted   Self Care Goal Status (J7366) At least 20 percent but less than 40 percent impaired, limited or restricted      Problem List Patient Active Problem List   Diagnosis Date Noted  . Pyelonephritis 07/31/2015  . Back pain 07/31/2015  . Abdominal pain  07/31/2015  . Constipation 07/31/2015  . Major depressive disorder, single episode, severe without psychotic features ([place]) 05/15/2015  . Spina bifida ([place]) 01/18/2012  . ESRD (end stage renal disease) on dialysis ([place]) 05/02/2011  . Anemia 05/02/2011  . HTN (hypertension) 05/02/2011    Ailene Ravel, OTR/L,CBIS  [phone]   12/05/2016, 2:43 PM  [facility] [address] [place], [place], [address] Phone: [phone]   Fax:  [phone]  Name: [name] MRN: [id number] Date of Birth: [birth year]

## 2016-12-05 NOTE — Patient Instructions (Signed)
Shoulder Circles: 10 times backwards.  1) Seated Row   Sit up straight with elbows by your sides. Pull back with shoulders/elbows, keeping forearms straight, as if pulling back on the reins of a horse. Squeeze shoulder blades together. Repeat _10__times, ____sets/day    2) Shoulder Elevation    Sit up straight with arms by your sides. Slowly bring your shoulders up towards your ears. Repeat_10__times, ____ sets/day    3) Shoulder Extension    Sit up straight with both arms by your side, draw your arms back behind your waist. Keep your elbows straight. Repeat _10___times, ____sets/day.           Sitting:         Begin with arms at your side with thumbs pointed up, slowly raise both arms up and forward towards overhead.     Sitting:           Begin with arms straight out in front of you, bring out to the side in at "T" shape. Keep arms straight entire time.     5) Shoulder Abduction  Sitting:       Lying on your back begin with your arms flat on the table next to your side. Slowly move your arms out to the side so that they go overhead, in a jumping jack or snow angel movement.       7) W arms:  Begin with elbows bent and even with shoulders, hands pointing to ceiling. Keeping elbows at shoulder level, 1-shrug shoulders up, 2-squeeze shoulder blades together, and 3-relax.    Repeat all exercises 10-15 times, 1-2 times per day.

## 2016-12-06 DIAGNOSIS — N186 End stage renal disease: Secondary | ICD-10-CM | POA: Diagnosis not present

## 2016-12-06 DIAGNOSIS — N390 Urinary tract infection, site not specified: Secondary | ICD-10-CM | POA: Diagnosis not present

## 2016-12-06 DIAGNOSIS — R300 Dysuria: Secondary | ICD-10-CM | POA: Diagnosis not present

## 2016-12-06 DIAGNOSIS — N2581 Secondary hyperparathyroidism of renal origin: Secondary | ICD-10-CM | POA: Diagnosis not present

## 2016-12-06 DIAGNOSIS — D631 Anemia in chronic kidney disease: Secondary | ICD-10-CM | POA: Diagnosis not present

## 2016-12-09 DIAGNOSIS — N186 End stage renal disease: Secondary | ICD-10-CM | POA: Diagnosis not present

## 2016-12-09 DIAGNOSIS — R300 Dysuria: Secondary | ICD-10-CM | POA: Diagnosis not present

## 2016-12-09 DIAGNOSIS — N2581 Secondary hyperparathyroidism of renal origin: Secondary | ICD-10-CM | POA: Diagnosis not present

## 2016-12-09 DIAGNOSIS — N390 Urinary tract infection, site not specified: Secondary | ICD-10-CM | POA: Diagnosis not present

## 2016-12-09 DIAGNOSIS — D631 Anemia in chronic kidney disease: Secondary | ICD-10-CM | POA: Diagnosis not present

## 2016-12-11 DIAGNOSIS — N186 End stage renal disease: Secondary | ICD-10-CM | POA: Diagnosis not present

## 2016-12-11 DIAGNOSIS — N2581 Secondary hyperparathyroidism of renal origin: Secondary | ICD-10-CM | POA: Diagnosis not present

## 2016-12-11 DIAGNOSIS — R300 Dysuria: Secondary | ICD-10-CM | POA: Diagnosis not present

## 2016-12-11 DIAGNOSIS — D631 Anemia in chronic kidney disease: Secondary | ICD-10-CM | POA: Diagnosis not present

## 2016-12-11 DIAGNOSIS — N390 Urinary tract infection, site not specified: Secondary | ICD-10-CM | POA: Diagnosis not present

## 2016-12-11 NOTE — Progress Notes (Deleted)
Primary Care Physician:  Estanislado Emms, MD Primary Gastroenterologist:  Dr. Oneida Alar  No chief complaint on file.   HPI:   [name] is a 26 y.o. female who presents for urgent office visit on referral from primary care for nausea, vomiting, abdominal pain.  The patient seems to been seen multiple times in the past year in the emergency department for various complaints including UTI, abdominal pain, viral infection, bowel impaction, adult neglect. Last emergency room visit on 09/15/2016. At this time the patient presented with a history of spina bifida, end-stage renal dialysis with 2 days of flank pain, fever, chills, nausea and history of frequent UTIs. Abdomen firm but diffuse tenderness on palpation. Hemoglobin noted to be low at 10.9, no leukocytosis, and lipase normal. Noted probable UTI. The patient underwent manual digital rectal disimpaction of a moderate amount of stool with rectal temperature 98.1.  CT scan was ordered and reviewed which showed large, thick wall gallbladder consistent with neurogenic bladder, horseshoe kidney, no ureteral stones, large impacted feces in the rectosigmoid colon. Recommended stool softener. Was given 2 doses of IV antibiotics emergency department and discharged home in satisfactory condition.  Today she states   Past Medical History:  Diagnosis Date  . Anemia associated with chronic renal failure   . Blood transfusion   . 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  . Depression 05/14/2015  . Dialysis care   . ESRD (end stage renal disease) on dialysis ([place])   . HTN (hypertension) 05/02/2011  . Spina bifida   . UTI (lower urinary tract infection)     Past Surgical History:  Procedure Laterality Date  . AV FISTULA PLACEMENT     left arm    Current Outpatient Prescriptions  Medication Sig Dispense Refill  . cinacalcet (SENSIPAR) 30 MG tablet Take 30 mg by mouth every other day.     . diphenhydrAMINE (BENADRYL) 25 mg  capsule Take 1 capsule (25 mg total) by mouth every 6 (six) hours as needed for itching. 30 capsule 0  . Epoetin Alfa (EPOGEN IJ) Inject as directed. Pt gets on dialysis days which are Tuesday, Thursday, and Saturday.    . fosfomycin (MONUROL) 3 g PACK Take one dose (3 grams) today and repeat in 3 days. (Patient not taking: Reported on 12/05/2016) 6 g 0  . hydrOXYzine (VISTARIL) 25 MG capsule Take 1 capsule (25 mg total) by mouth 3 (three) times daily as needed for itching. (Patient not taking: Reported on 11/17/2016) 15 capsule 0  . multivitamin (RENA-VIT) TABS tablet Take 1 tablet by mouth every Tuesday, Thursday, and Saturday at 6 PM.    . omeprazole (PRILOSEC) 20 MG capsule Take 20 mg by mouth daily.  3  . sevelamer carbonate (RENVELA) 800 MG tablet Take 800 mg by mouth 3 (three) times daily with meals.      No current facility-administered medications for this visit.     Allergies as of 12/12/2016 - Review Complete 11/17/2016  Allergen Reaction Noted  . Ciprofloxacin Shortness Of Breath, Nausea And Vomiting, and Other (See Comments) 05/02/2011  . Other Anaphylaxis 05/02/2011  . Peanut-containing drug products Anaphylaxis 11/21/2011  . Aleve [naproxen sodium] Other (See Comments) 03/29/2012  . Ceftriaxone Other (See Comments) 09/15/2016  . Coconut oil Hives 12/05/2016  . Influenza vaccines Nausea And Vomiting 11/21/2011  . Tetanus toxoids Nausea And Vomiting 05/02/2011  . Latex Itching and Rash 05/02/2011    Family History  Problem Relation Age of Onset  .  Kidney cancer Other   . Hypertension Maternal Grandmother   . Arthritis Maternal Grandmother     Social History   Social History  . Marital status: Single    Spouse name: N/A  . Number of children: N/A  . Years of education: N/A   Occupational History  . Not on file.   Social History Main Topics  . Smoking status: Current Every Day Smoker  . Smokeless tobacco: Never Used     Comment: 2 cigs a day  . Alcohol use No    . Drug use: No  . Sexual activity: No   Other Topics Concern  . Not on file   Social History Narrative  . No narrative on file    Review of Systems: General: Negative for anorexia, weight loss, fever, chills, fatigue, weakness. Eyes: Negative for vision changes.  ENT: Negative for hoarseness, difficulty swallowing , nasal congestion. CV: Negative for chest pain, angina, palpitations, dyspnea on exertion, peripheral edema.  Respiratory: Negative for dyspnea at rest, dyspnea on exertion, cough, sputum, wheezing.  GI: See history of present illness. GU:  Negative for dysuria, hematuria, urinary incontinence, urinary frequency, nocturnal urination.  MS: Negative for joint pain, low back pain.  Derm: Negative for rash or itching.  Neuro: Negative for weakness, abnormal sensation, seizure, frequent headaches, memory loss, confusion.  Psych: Negative for anxiety, depression, suicidal ideation, hallucinations.  Endo: Negative for unusual weight change.  Heme: Negative for bruising or bleeding. Allergy: Negative for rash or hives.    Physical Exam: There were no vitals taken for this visit. General:   Alert and oriented. Pleasant and cooperative. Well-nourished and well-developed.  Head:  Normocephalic and atraumatic. Eyes:  Without icterus, sclera clear and conjunctiva pink.  Ears:  Normal auditory acuity. Mouth:  No deformity or lesions, oral mucosa pink.  Throat/Neck:  Supple, without mass or thyromegaly. Cardiovascular:  S1, S2 present without murmurs appreciated. Normal pulses noted. Extremities without clubbing or edema. Respiratory:  Clear to auscultation bilaterally. No wheezes, rales, or rhonchi. No distress.  Gastrointestinal:  +BS, soft, non-tender and non-distended. No HSM noted. No guarding or rebound. No masses appreciated.  Rectal:  Deferred  Musculoskalatal:  Symmetrical without gross deformities. Normal posture. Skin:  Intact without significant lesions or  rashes. Neurologic:  Alert and oriented x4;  grossly normal neurologically. Psych:  Alert and cooperative. Normal mood and affect. Heme/Lymph/Immune: No significant cervical adenopathy. No excessive bruising noted.    12/11/2016 3:58 PM   Disclaimer: This note was dictated with voice recognition software. Similar sounding words can inadvertently be transcribed and may not be corrected upon review.

## 2016-12-12 ENCOUNTER — Ambulatory Visit: Payer: Medicare Other | Admitting: Nurse Practitioner

## 2016-12-12 ENCOUNTER — Encounter (HOSPITAL_COMMUNITY): Payer: Self-pay

## 2016-12-12 ENCOUNTER — Ambulatory Visit (HOSPITAL_COMMUNITY): Payer: Medicare Other

## 2016-12-12 ENCOUNTER — Telehealth (HOSPITAL_COMMUNITY): Payer: Self-pay | Admitting: Nephrology

## 2016-12-12 DIAGNOSIS — M25611 Stiffness of right shoulder, not elsewhere classified: Secondary | ICD-10-CM

## 2016-12-12 DIAGNOSIS — M25511 Pain in right shoulder: Secondary | ICD-10-CM | POA: Diagnosis not present

## 2016-12-12 DIAGNOSIS — R29898 Other symptoms and signs involving the musculoskeletal system: Secondary | ICD-10-CM | POA: Diagnosis not present

## 2016-12-12 NOTE — Telephone Encounter (Signed)
12/12/16 pt called and we had to cancel the 5/21 appt because she said that [facility] was full and couldn't bring her.  We rescheduled for 5/25

## 2016-12-12 NOTE — Therapy (Signed)
[place], [place], [address] Phone: [phone]   Fax:  [phone]  Occupational Therapy Treatment  Patient Details  Name: [name] MRN: [id number] Date of Birth: 1990/11/07 Referring Provider: Lucianne Lei MD  Encounter Date: 12/12/2016      OT End of Session - 12/12/16 1049    Visit Number 2   Number of Visits 8   Date for OT Re-Evaluation 01/04/17   Authorization Type 1) Medicare 2) Medicaid   Authorization Time Period before 10th visit   Authorization - Visit Number 2   Authorization - Number of Visits 10   OT Start Time [phone]   OT Stop Time 1030   OT Time Calculation (min) 41 min   Activity Tolerance Patient tolerated treatment well   Behavior During Therapy [facility] for tasks assessed/performed      Past Medical History:  Diagnosis Date  . Anemia associated with chronic renal failure   . Blood transfusion   . 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  . Depression 05/14/2015  . Dialysis care   . ESRD (end stage renal disease) on dialysis ([place])   . HTN (hypertension) 05/02/2011  . Spina bifida   . UTI (lower urinary tract infection)     Past Surgical History:  Procedure Laterality Date  . AV FISTULA PLACEMENT     left arm    There were no vitals filed for this visit.          [facility] OT Assessment - 12/12/16 1044      Assessment   Diagnosis Right shoulder pain     Precautions   Precautions Other (comment)   Precaution Comments Pt is wheelchair bound.                   OT Treatments/Exercises (OP) - 12/12/16 1044      Exercises   Exercises Shoulder     Shoulder Exercises: Supine   Protraction PROM;AROM;5 reps   Horizontal ABduction PROM;5 reps   External Rotation PROM;AROM;5 reps  abducted   Internal Rotation PROM;AROM;5 reps  abducted   Flexion PROM;AROM;5 reps   ABduction PROM;AROM;5 reps     Shoulder Exercises: Seated   Retraction  AROM;10 reps   Row AROM;Theraband;10 reps   Theraband Level (Shoulder Row) Level 2 (Red)   Horizontal ABduction AROM;10 reps   Flexion AROM;10 reps   Abduction AROM;10 reps     Modalities   Modalities Moist Heat     Moist Heat Therapy   Number Minutes Moist Heat 10 Minutes   Moist Heat Location Shoulder     Manual Therapy   Manual Therapy Myofascial release   Manual therapy comments Manual therapy completed prior to exercises   Myofascial Release Myofascial release and manual stretching completed to the right upper arm, trapezius, and scapularis region to decrease fascial restrictions and increase joint mobility in a pain free zone.                 OT Education - 12/12/16 1047    Education provided Yes   Education Details Pt was given OT evaluation for goals and plan of care reference. Discussed posture when sitting in wheelchair and out. Pt informed to stop with A/ROM extension for HEP as it causes increased pain.    Person(s) Educated Patient   Methods Explanation;Demonstration;Handout;Verbal cues   Comprehension Verbalized understanding;Returned demonstration          OT Short Term  Goals - 12/12/16 1052      OT SHORT TERM GOAL #1   Title Patient will be educated and indepdent with HEP to increase functional mobility of RUE.   Time 4   Period Weeks   Status On-going     OT SHORT TERM GOAL #2   Title Patient will increase A/ROM of RUE to WNL to increase ability to reachi up overhead for items.    Time 4   Period Weeks   Status On-going     OT SHORT TERM GOAL #3   Title Patient will increase RUE strength to 5/5 to increase ability to seld propell wheelchair with less pain.    Time 4   Period Weeks   Status On-going     OT SHORT TERM GOAL #4   Title Patient will decrease pain level to 2/10 when using RUE.    Time 4   Period Weeks   Status On-going     OT SHORT TERM GOAL #5   Title Patient will decrease fascial restrictions to min amount or less to  increase functional mobility of RUE.    Time 4   Period Weeks   Status On-going                  Plan - 12/12/16 1050    Clinical Impression Statement A: Initiated myofascial release, manual stretching and A/ROM exercises for scapular strength to promote good posture when seated. VC needed during seated exercises for proper posture awareness. Pt reports that she has upper back pain located at the right medial border of the right scapula. Good response to manual techniques this session.    Plan P: Follow up on HEP exercises. Continue with scapular strengthening and posture education.       Patient will benefit from skilled therapeutic intervention in order to improve the following deficits and impairments:  Decreased strength, Pain, Decreased range of motion, Decreased coordination, Impaired UE functional use, Increased fascial restricitons  Visit Diagnosis: Acute pain of right shoulder  Other symptoms and signs involving the musculoskeletal system  Stiffness of right shoulder, not elsewhere classified    Problem List Patient Active Problem List   Diagnosis Date Noted  . Pyelonephritis 07/31/2015  . Back pain 07/31/2015  . Abdominal pain 07/31/2015  . Constipation 07/31/2015  . Major depressive disorder, single episode, severe without psychotic features ([place]) 05/15/2015  . Spina bifida ([place]) 01/18/2012  . ESRD (end stage renal disease) on dialysis ([place]) 05/02/2011  . Anemia 05/02/2011  . HTN (hypertension) 05/02/2011   Ailene Ravel, OTR/L,CBIS  [phone]  12/12/2016, 10:53 AM  [place] [address] [place], [place], [address] Phone: [phone]   Fax:  [phone]  Name: [name] MRN: [id number] Date of Birth: 1990/12/10

## 2016-12-13 DIAGNOSIS — N186 End stage renal disease: Secondary | ICD-10-CM | POA: Diagnosis not present

## 2016-12-13 DIAGNOSIS — N390 Urinary tract infection, site not specified: Secondary | ICD-10-CM | POA: Diagnosis not present

## 2016-12-13 DIAGNOSIS — R300 Dysuria: Secondary | ICD-10-CM | POA: Diagnosis not present

## 2016-12-13 DIAGNOSIS — N2581 Secondary hyperparathyroidism of renal origin: Secondary | ICD-10-CM | POA: Diagnosis not present

## 2016-12-13 DIAGNOSIS — D631 Anemia in chronic kidney disease: Secondary | ICD-10-CM | POA: Diagnosis not present

## 2016-12-15 ENCOUNTER — Encounter (HOSPITAL_COMMUNITY): Payer: Self-pay

## 2016-12-16 DIAGNOSIS — N2581 Secondary hyperparathyroidism of renal origin: Secondary | ICD-10-CM | POA: Diagnosis not present

## 2016-12-16 DIAGNOSIS — N186 End stage renal disease: Secondary | ICD-10-CM | POA: Diagnosis not present

## 2016-12-16 DIAGNOSIS — D631 Anemia in chronic kidney disease: Secondary | ICD-10-CM | POA: Diagnosis not present

## 2016-12-16 DIAGNOSIS — R300 Dysuria: Secondary | ICD-10-CM | POA: Diagnosis not present

## 2016-12-16 DIAGNOSIS — N390 Urinary tract infection, site not specified: Secondary | ICD-10-CM | POA: Diagnosis not present

## 2016-12-17 ENCOUNTER — Ambulatory Visit (HOSPITAL_COMMUNITY): Payer: Medicare Other | Admitting: Occupational Therapy

## 2016-12-17 ENCOUNTER — Encounter (HOSPITAL_COMMUNITY): Payer: Self-pay | Admitting: Occupational Therapy

## 2016-12-17 DIAGNOSIS — M25611 Stiffness of right shoulder, not elsewhere classified: Secondary | ICD-10-CM

## 2016-12-17 DIAGNOSIS — R29898 Other symptoms and signs involving the musculoskeletal system: Secondary | ICD-10-CM | POA: Diagnosis not present

## 2016-12-17 DIAGNOSIS — M25511 Pain in right shoulder: Secondary | ICD-10-CM

## 2016-12-17 NOTE — Therapy (Signed)
[place], [place], [address] Phone: [phone]   Fax:  [phone]  Occupational Therapy Treatment  Patient Details  Name: [name] MRN: [id number] Date of Birth: June 05, 1991 Referring Provider: Lucianne Lei MD  Encounter Date: 12/17/2016      OT End of Session - 12/17/16 1648    Visit Number 3   Number of Visits 8   Date for OT Re-Evaluation 01/04/17   Authorization Type 1) Medicare 2) Medicaid   Authorization Time Period before 10th visit   Authorization - Visit Number 3   Authorization - Number of Visits 10   OT Start Time 4481   OT Stop Time 1430   OT Time Calculation (min) 43 min   Activity Tolerance Patient tolerated treatment well   Behavior During Therapy [facility] for tasks assessed/performed      Past Medical History:  Diagnosis Date  . Anemia associated with chronic renal failure   . Blood transfusion   . 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  . Depression 05/14/2015  . Dialysis care   . ESRD (end stage renal disease) on dialysis ([place])   . HTN (hypertension) 05/02/2011  . Spina bifida   . UTI (lower urinary tract infection)     Past Surgical History:  Procedure Laterality Date  . AV FISTULA PLACEMENT     left arm    There were no vitals filed for this visit.      Subjective Assessment - 12/17/16 1350    Subjective  S: I've been trying to work on my posture.    Currently in Pain? Yes   Pain Score 7    Pain Location Shoulder   Pain Orientation Right   Pain Descriptors / Indicators Sore   Pain Type Acute pain   Pain Radiating Towards neck and back   Pain Onset 1 to 4 weeks ago   Pain Frequency Occasional   Aggravating Factors  movement and use   Pain Relieving Factors rest    Effect of Pain on Daily Activities pt pushed through the pain   Multiple Pain Sites No            [facility] OT Assessment - 12/17/16 1347      Assessment   Diagnosis Right  shoulder pain     Precautions   Precautions Other (comment)   Precaution Comments Pt is wheelchair bound.                   OT Treatments/Exercises (OP) - 12/17/16 1354      Exercises   Exercises Shoulder     Shoulder Exercises: Supine   Protraction PROM;AROM;5 reps   Horizontal ABduction PROM;AROM;5 reps   External Rotation PROM;AROM;5 reps  abducted   Internal Rotation PROM;AROM;5 reps  abducted   Flexion PROM;AROM;5 reps   ABduction PROM;AROM;5 reps     Shoulder Exercises: Seated   Retraction AROM;10 reps   Row AROM;Theraband;10 reps   Theraband Level (Shoulder Row) Level 2 (Red)   Horizontal ABduction AROM;10 reps   Flexion AROM;10 reps   Abduction AROM;10 reps     Shoulder Exercises: ROM/Strengthening   "W" Arms 5X   X to V Arms 5X   Proximal Shoulder Strengthening, Supine 10X each no rest breaks   Proximal Shoulder Strengthening, Seated 10X each 1 rest break   Rhythmic Stabilization, Seated 30" at 90 degrees and 145 degrees, min/mod difficulty     Manual Therapy  Manual Therapy Myofascial release   Manual therapy comments Manual therapy completed prior to exercises   Myofascial Release Myofascial release and manual stretching completed to the right upper arm, trapezius, and scapularis region to decrease fascial restrictions and increase joint mobility in a pain free zone.                   OT Short Term Goals - 12/12/16 1052      OT SHORT TERM GOAL #1   Title Patient will be educated and indepdent with HEP to increase functional mobility of RUE.   Time 4   Period Weeks   Status On-going     OT SHORT TERM GOAL #2   Title Patient will increase A/ROM of RUE to WNL to increase ability to reachi up overhead for items.    Time 4   Period Weeks   Status On-going     OT SHORT TERM GOAL #3   Title Patient will increase RUE strength to 5/5 to increase ability to seld propell wheelchair with less pain.    Time 4   Period Weeks   Status  On-going     OT SHORT TERM GOAL #4   Title Patient will decrease pain level to 2/10 when using RUE.    Time 4   Period Weeks   Status On-going     OT SHORT TERM GOAL #5   Title Patient will decrease fascial restrictions to min amount or less to increase functional mobility of RUE.    Time 4   Period Weeks   Status On-going                  Plan - 12/17/16 1648    Clinical Impression Statement A: Continued with manual therapy to right medial border of the scapula this session as pt continues to have pain and restrictions in this region. Added x to v arms, w arms, and proximal shoulder strengthening this session, intermittent verbal cuing for form and for good posture. Pt reports HEP is going well.    Plan P: Continue with scapular strengthening, increasing x to v reps and w arm reps as able to tolerate.    OT Home Exercise Plan 5/11: scapular and shoulder A/ROM exercises   Consulted and Agree with Plan of Care Patient      Patient will benefit from skilled therapeutic intervention in order to improve the following deficits and impairments:  Decreased strength, Pain, Decreased range of motion, Decreased coordination, Impaired UE functional use, Increased fascial restricitons  Visit Diagnosis: Acute pain of right shoulder  Other symptoms and signs involving the musculoskeletal system  Stiffness of right shoulder, not elsewhere classified    Problem List Patient Active Problem List   Diagnosis Date Noted  . Pyelonephritis 07/31/2015  . Back pain 07/31/2015  . Abdominal pain 07/31/2015  . Constipation 07/31/2015  . Major depressive disorder, single episode, severe without psychotic features ([place]) 05/15/2015  . Spina bifida ([place]) 01/18/2012  . ESRD (end stage renal disease) on dialysis ([place]) 05/02/2011  . Anemia 05/02/2011  . HTN (hypertension) 05/02/2011   Guadelupe Sabin, OTR/L  [phone] 12/17/2016, 4:51 PM  [place], [place], [address] Phone: [phone]   Fax:  [phone]  Name: JOSEE SPEECE MRN: [id number] Date of Birth: October 07, 1990

## 2016-12-18 DIAGNOSIS — N2581 Secondary hyperparathyroidism of renal origin: Secondary | ICD-10-CM | POA: Diagnosis not present

## 2016-12-18 DIAGNOSIS — R300 Dysuria: Secondary | ICD-10-CM | POA: Diagnosis not present

## 2016-12-18 DIAGNOSIS — N390 Urinary tract infection, site not specified: Secondary | ICD-10-CM | POA: Diagnosis not present

## 2016-12-18 DIAGNOSIS — D631 Anemia in chronic kidney disease: Secondary | ICD-10-CM | POA: Diagnosis not present

## 2016-12-18 DIAGNOSIS — N186 End stage renal disease: Secondary | ICD-10-CM | POA: Diagnosis not present

## 2016-12-19 ENCOUNTER — Encounter (HOSPITAL_COMMUNITY): Payer: Self-pay | Admitting: Occupational Therapy

## 2016-12-19 ENCOUNTER — Ambulatory Visit (HOSPITAL_COMMUNITY): Payer: Medicare Other | Admitting: Occupational Therapy

## 2016-12-19 DIAGNOSIS — M25611 Stiffness of right shoulder, not elsewhere classified: Secondary | ICD-10-CM

## 2016-12-19 DIAGNOSIS — R29898 Other symptoms and signs involving the musculoskeletal system: Secondary | ICD-10-CM

## 2016-12-19 DIAGNOSIS — M25511 Pain in right shoulder: Secondary | ICD-10-CM | POA: Diagnosis not present

## 2016-12-19 NOTE — Therapy (Signed)
[place], [place], [address] Phone: [phone]   Fax:  [phone]  Occupational Therapy Treatment  Patient Details  Name: [name] MRN: [id number] Date of Birth: 12/11/1990 Referring Provider: Lucianne Lei MD  Encounter Date: 12/19/2016      OT End of Session - 12/19/16 1329    Visit Number 4   Number of Visits 8   Date for OT Re-Evaluation 01/04/17   Authorization Type 1) Medicare 2) Medicaid   Authorization Time Period before 10th visit   Authorization - Visit Number 4   Authorization - Number of Visits 10   OT Start Time 1119   OT Stop Time 1200   OT Time Calculation (min) 41 min   Activity Tolerance Patient tolerated treatment well   Behavior During Therapy [facility] for tasks assessed/performed      Past Medical History:  Diagnosis Date  . Anemia associated with chronic renal failure   . Blood transfusion   . 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  . Depression 05/14/2015  . Dialysis care   . ESRD (end stage renal disease) on dialysis ([place])   . HTN (hypertension) 05/02/2011  . Spina bifida   . UTI (lower urinary tract infection)     Past Surgical History:  Procedure Laterality Date  . AV FISTULA PLACEMENT     left arm    There were no vitals filed for this visit.      Subjective Assessment - 12/19/16 1119    Subjective  S: I'm not supposed to exercise my left arm much.    Currently in Pain? Yes   Pain Score 2    Pain Location Shoulder   Pain Orientation Right   Pain Descriptors / Indicators Sore   Pain Type Acute pain   Pain Radiating Towards neck and back   Pain Onset 1 to 4 weeks ago   Pain Frequency Occasional   Aggravating Factors  movement and use   Pain Relieving Factors rest   Effect of Pain on Daily Activities pt pushed through the pain   Multiple Pain Sites No            [facility] OT Assessment - 12/19/16 1118      Assessment   Diagnosis Right  shoulder pain     Precautions   Precautions Other (comment)   Precaution Comments Pt is wheelchair bound.                   OT Treatments/Exercises (OP) - 12/19/16 1122      Exercises   Exercises Shoulder     Shoulder Exercises: Supine   Protraction PROM;5 reps;AROM;10 reps   Horizontal ABduction PROM;5 reps;AROM;10 reps   External Rotation PROM;5 reps;AROM;10 reps  abducted   Internal Rotation PROM;5 reps;AROM;10 reps  abducted   Flexion PROM;5 reps;AROM;12 reps   ABduction PROM;5 reps;AROM;12 reps     Shoulder Exercises: Seated   Row Theraband;10 reps   Theraband Level (Shoulder Row) Level 2 (Red)   Protraction Theraband;10 reps   Theraband Level (Shoulder Protraction) Level 2 (Red)   Horizontal ABduction Theraband;10 reps   Theraband Level (Shoulder Horizontal ABduction) Level 2 (Red)   External Rotation Theraband;10 reps   Theraband Level (Shoulder External Rotation) Level 2 (Red)   Internal Rotation Theraband;10 reps   Theraband Level (Shoulder Internal Rotation) Level 2 (Red)   Flexion Theraband;10 reps   Theraband Level (Shoulder Flexion) Level 2 (Red)  Abduction Theraband;10 reps   Theraband Level (Shoulder ABduction) Level 2 (Red)   Other Seated Exercises using dark green physio gymnic ball: overhead press 10x     Shoulder Exercises: ROM/Strengthening   "W" Arms 10X   X to V Arms 10X   Proximal Shoulder Strengthening, Seated 10X each no rest breaks     Manual Therapy   Manual Therapy Myofascial release   Manual therapy comments Manual therapy completed prior to exercises   Myofascial Release Myofascial release and manual stretching completed to the right upper arm, trapezius, and scapularis region to decrease fascial restrictions and increase joint mobility in a pain free zone.                   OT Short Term Goals - 12/12/16 1052      OT SHORT TERM GOAL #1   Title Patient will be educated and indepdent with HEP to increase  functional mobility of RUE.   Time 4   Period Weeks   Status On-going     OT SHORT TERM GOAL #2   Title Patient will increase A/ROM of RUE to WNL to increase ability to reachi up overhead for items.    Time 4   Period Weeks   Status On-going     OT SHORT TERM GOAL #3   Title Patient will increase RUE strength to 5/5 to increase ability to seld propell wheelchair with less pain.    Time 4   Period Weeks   Status On-going     OT SHORT TERM GOAL #4   Title Patient will decrease pain level to 2/10 when using RUE.    Time 4   Period Weeks   Status On-going     OT SHORT TERM GOAL #5   Title Patient will decrease fascial restrictions to min amount or less to increase functional mobility of RUE.    Time 4   Period Weeks   Status On-going                  Plan - 12/19/16 1329    Clinical Impression Statement A: Continued with manual therapy to scapular border, pt reports improvements. Pt only completed exercises with RUE as she reports she is not supposed to use the LUE during strenuous exercises. Pt required verbal cuing for form, continued to focus on scapular strengthening this session.    Plan P: continue with scapular strengthening with RUE only, add ball on wall if able to complete.    OT Home Exercise Plan 5/11: scapular and shoulder A/ROM exercises   Consulted and Agree with Plan of Care Patient      Patient will benefit from skilled therapeutic intervention in order to improve the following deficits and impairments:  Decreased strength, Pain, Decreased range of motion, Decreased coordination, Impaired UE functional use, Increased fascial restricitons  Visit Diagnosis: Acute pain of right shoulder  Other symptoms and signs involving the musculoskeletal system  Stiffness of right shoulder, not elsewhere classified    Problem List Patient Active Problem List   Diagnosis Date Noted  . Pyelonephritis 07/31/2015  . Back pain 07/31/2015  . Abdominal pain  07/31/2015  . Constipation 07/31/2015  . Major depressive disorder, single episode, severe without psychotic features ([place]) 05/15/2015  . Spina bifida ([place]) 01/18/2012  . ESRD (end stage renal disease) on dialysis ([place]) 05/02/2011  . Anemia 05/02/2011  . HTN (hypertension) 05/02/2011   Guadelupe Sabin, OTR/L  [phone] 12/19/2016, 1:31 PM  [place]  [facility] [place], [place], [address] Phone: [phone]   Fax:  [phone]  Name: CAYDANCE KUEHNLE MRN: [id number] Date of Birth: May 31, 1991

## 2016-12-20 DIAGNOSIS — N2581 Secondary hyperparathyroidism of renal origin: Secondary | ICD-10-CM | POA: Diagnosis not present

## 2016-12-20 DIAGNOSIS — D631 Anemia in chronic kidney disease: Secondary | ICD-10-CM | POA: Diagnosis not present

## 2016-12-20 DIAGNOSIS — N390 Urinary tract infection, site not specified: Secondary | ICD-10-CM | POA: Diagnosis not present

## 2016-12-20 DIAGNOSIS — R300 Dysuria: Secondary | ICD-10-CM | POA: Diagnosis not present

## 2016-12-20 DIAGNOSIS — N186 End stage renal disease: Secondary | ICD-10-CM | POA: Diagnosis not present

## 2016-12-23 DIAGNOSIS — D631 Anemia in chronic kidney disease: Secondary | ICD-10-CM | POA: Diagnosis not present

## 2016-12-23 DIAGNOSIS — R300 Dysuria: Secondary | ICD-10-CM | POA: Diagnosis not present

## 2016-12-23 DIAGNOSIS — N2581 Secondary hyperparathyroidism of renal origin: Secondary | ICD-10-CM | POA: Diagnosis not present

## 2016-12-23 DIAGNOSIS — N390 Urinary tract infection, site not specified: Secondary | ICD-10-CM | POA: Diagnosis not present

## 2016-12-23 DIAGNOSIS — N186 End stage renal disease: Secondary | ICD-10-CM | POA: Diagnosis not present

## 2016-12-24 ENCOUNTER — Ambulatory Visit (HOSPITAL_COMMUNITY): Payer: Medicare Other | Admitting: Occupational Therapy

## 2016-12-24 ENCOUNTER — Encounter (HOSPITAL_COMMUNITY): Payer: Self-pay | Admitting: Occupational Therapy

## 2016-12-24 DIAGNOSIS — M25511 Pain in right shoulder: Secondary | ICD-10-CM | POA: Diagnosis not present

## 2016-12-24 DIAGNOSIS — M25611 Stiffness of right shoulder, not elsewhere classified: Secondary | ICD-10-CM

## 2016-12-24 DIAGNOSIS — R29898 Other symptoms and signs involving the musculoskeletal system: Secondary | ICD-10-CM | POA: Diagnosis not present

## 2016-12-24 NOTE — Therapy (Signed)
[place], [place], [address] Phone: [phone]   Fax:  [phone]  Occupational Therapy Treatment  Patient Details  Name: MAKENNAH OMURA MRN: [id number] Date of Birth: 28-Feb-1991 Referring Provider: Lucianne Lei MD  Encounter Date: 12/24/2016      OT End of Session - 12/24/16 1512    Visit Number 5   Number of Visits 8   Date for OT Re-Evaluation 01/04/17   Authorization Type 1) Medicare 2) Medicaid   Authorization Time Period before 10th visit   Authorization - Visit Number 5   Authorization - Number of Visits 10   OT Start Time 3536   OT Stop Time 1428   OT Time Calculation (min) 41 min   Activity Tolerance Patient tolerated treatment well   Behavior During Therapy [facility] for tasks assessed/performed      Past Medical History:  Diagnosis Date  . Anemia associated with chronic renal failure   . Blood transfusion   . 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  . Depression 05/14/2015  . Dialysis care   . ESRD (end stage renal disease) on dialysis ([place])   . HTN (hypertension) 05/02/2011  . Spina bifida   . UTI (lower urinary tract infection)     Past Surgical History:  Procedure Laterality Date  . AV FISTULA PLACEMENT     left arm    There were no vitals filed for this visit.      Subjective Assessment - 12/24/16 1347    Subjective  S: The pain is just so common now, I don't notice it as much.   Currently in Pain? Yes   Pain Score 2    Pain Location Shoulder   Pain Orientation Right   Pain Descriptors / Indicators Sore   Pain Type Acute pain   Pain Radiating Towards neck and back   Pain Onset 1 to 4 weeks ago   Pain Frequency Occasional   Aggravating Factors  movement and use   Pain Relieving Factors rest   Effect of Pain on Daily Activities  pt pushes through the pain   Multiple Pain Sites No            [facility] OT Assessment - 12/24/16 1346      Assessment   Diagnosis Right shoulder pain     Precautions   Precautions Other (comment)   Precaution Comments Pt is wheelchair bound.                   OT Treatments/Exercises (OP) - 12/24/16 1350      Exercises   Exercises Shoulder     Shoulder Exercises: Supine   Protraction AROM;12 reps   Horizontal ABduction AROM;12 reps   External Rotation AROM;12 reps  abducted   Internal Rotation AROM;12 reps  abducted   Flexion AROM;12 reps   ABduction AROM;12 reps   Other Supine Exercises using dark green physio gymnic ball: protraction, flexion 10X each     Shoulder Exercises: Seated   Row Theraband;10 reps   Theraband Level (Shoulder Row) Level 2 (Red)   Protraction Theraband;10 reps   Theraband Level (Shoulder Protraction) Level 2 (Red)   Horizontal ABduction Theraband;10 reps   Theraband Level (Shoulder Horizontal ABduction) Level 2 (Red)   External Rotation Theraband;10 reps   Theraband Level (Shoulder External Rotation) Level 2 (Red)   Internal Rotation Theraband;10 reps   Theraband Level (Shoulder Internal Rotation) Level 2 (Red)   Flexion  Theraband;10 reps   Theraband Level (Shoulder Flexion) Level 2 (Red)   Abduction Theraband;10 reps   Theraband Level (Shoulder ABduction) Level 2 (Red)   Other Seated Exercises using dark green physio gymnic ball: overhead press 10x     Shoulder Exercises: ROM/Strengthening   X to V Arms 10X   Proximal Shoulder Strengthening, Seated 10X each no rest breaks   Ball on Wall 1' flexion, 1' abduction     Manual Therapy   Manual Therapy Myofascial release   Manual therapy comments Manual therapy completed prior to exercises   Myofascial Release Myofascial release and manual stretching completed to the right upper arm, trapezius, and scapularis region to decrease fascial restrictions and increase joint mobility in a pain free zone.                   OT Short Term Goals - 12/12/16 1052      OT SHORT TERM GOAL #1   Title  Patient will be educated and indepdent with HEP to increase functional mobility of RUE.   Time 4   Period Weeks   Status On-going     OT SHORT TERM GOAL #2   Title Patient will increase A/ROM of RUE to WNL to increase ability to reachi up overhead for items.    Time 4   Period Weeks   Status On-going     OT SHORT TERM GOAL #3   Title Patient will increase RUE strength to 5/5 to increase ability to seld propell wheelchair with less pain.    Time 4   Period Weeks   Status On-going     OT SHORT TERM GOAL #4   Title Patient will decrease pain level to 2/10 when using RUE.    Time 4   Period Weeks   Status On-going     OT SHORT TERM GOAL #5   Title Patient will decrease fascial restrictions to min amount or less to increase functional mobility of RUE.    Time 4   Period Weeks   Status On-going                  Plan - 12/24/16 1512    Clinical Impression Statement A: Continued with manual therapy, min fascial restrictions along scapular border today. Continued with RUE strengthening, pt able to complete exercises with greater ease today, occasional verbal cuing for form and posture. Added ball on wall, mod difficulty due to fatigue.    Plan P: attempt 1# weight with supine exercises for strengthening RUE, resume w arms    OT Home Exercise Plan 5/11: scapular and shoulder A/ROM exercises   Consulted and Agree with Plan of Care Patient      Patient will benefit from skilled therapeutic intervention in order to improve the following deficits and impairments:  Decreased strength, Pain, Decreased range of motion, Decreased coordination, Impaired UE functional use, Increased fascial restricitons  Visit Diagnosis: Acute pain of right shoulder  Other symptoms and signs involving the musculoskeletal system  Stiffness of right shoulder, not elsewhere classified    Problem List Patient Active Problem List   Diagnosis Date Noted  . Pyelonephritis 07/31/2015  . Back pain  07/31/2015  . Abdominal pain 07/31/2015  . Constipation 07/31/2015  . Major depressive disorder, single episode, severe without psychotic features ([place]) 05/15/2015  . Spina bifida ([place]) 01/18/2012  . ESRD (end stage renal disease) on dialysis ([place]) 05/02/2011  . Anemia 05/02/2011  . HTN (hypertension) 05/02/2011   Guadelupe Sabin,  OTR/L  [phone] 12/24/2016, 3:15 PM  [place], [place], [address] Phone: [phone]   Fax:  [phone]  Name: KIRIN BRANDENBURGER MRN: [id number] Date of Birth: 1991-01-16

## 2016-12-25 DIAGNOSIS — N2581 Secondary hyperparathyroidism of renal origin: Secondary | ICD-10-CM | POA: Diagnosis not present

## 2016-12-25 DIAGNOSIS — N186 End stage renal disease: Secondary | ICD-10-CM | POA: Diagnosis not present

## 2016-12-25 DIAGNOSIS — Z992 Dependence on renal dialysis: Secondary | ICD-10-CM | POA: Diagnosis not present

## 2016-12-25 DIAGNOSIS — R300 Dysuria: Secondary | ICD-10-CM | POA: Diagnosis not present

## 2016-12-25 DIAGNOSIS — D631 Anemia in chronic kidney disease: Secondary | ICD-10-CM | POA: Diagnosis not present

## 2016-12-25 DIAGNOSIS — N390 Urinary tract infection, site not specified: Secondary | ICD-10-CM | POA: Diagnosis not present

## 2016-12-25 DIAGNOSIS — I120 Hypertensive chronic kidney disease with stage 5 chronic kidney disease or end stage renal disease: Secondary | ICD-10-CM | POA: Diagnosis not present

## 2016-12-26 ENCOUNTER — Ambulatory Visit (HOSPITAL_COMMUNITY): Payer: Medicare Other | Attending: Family Medicine

## 2016-12-26 ENCOUNTER — Encounter (HOSPITAL_COMMUNITY): Payer: Self-pay

## 2016-12-26 DIAGNOSIS — R29898 Other symptoms and signs involving the musculoskeletal system: Secondary | ICD-10-CM | POA: Diagnosis not present

## 2016-12-26 DIAGNOSIS — M25511 Pain in right shoulder: Secondary | ICD-10-CM | POA: Diagnosis not present

## 2016-12-26 DIAGNOSIS — M25611 Stiffness of right shoulder, not elsewhere classified: Secondary | ICD-10-CM | POA: Insufficient documentation

## 2016-12-26 NOTE — Therapy (Addendum)
[place], [place], [address] Phone: [phone]   Fax:  [phone]  Occupational Therapy Treatment  Patient Details  Name: [name] MRN: [id number] Date of Birth: [birth year] Referring Provider: Lucianne Lei MD  Encounter Date: 12/26/2016      OT End of Session - 12/26/16 1452    Visit Number 6   Number of Visits 8   Date for OT Re-Evaluation 01/04/17   Authorization Type 1) Medicare 2) Medicaid   Authorization Time Period before 10th visit   Authorization - Visit Number 6   Authorization - Number of Visits 10   OT Start Time 1350   OT Stop Time 1438   OT Time Calculation (min) 48 min   Activity Tolerance Patient tolerated treatment well   Behavior During Therapy [facility] for tasks assessed/performed      Past Medical History:  Diagnosis Date  . Anemia associated with chronic renal failure   . Blood transfusion   . 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  . Depression 05/14/2015  . Dialysis care   . ESRD (end stage renal disease) on dialysis ([place])   . HTN (hypertension) 05/02/2011  . Spina bifida   . UTI (lower urinary tract infection)     Past Surgical History:  Procedure Laterality Date  . AV FISTULA PLACEMENT     left arm    There were no vitals filed for this visit.      Subjective Assessment - 12/26/16 1409    Subjective  S: I didn't sleep long yesterday. I was up late and then I had to leave early from my cousin's house. My back hurts.   Currently in Pain? Yes   Pain Score 8    Pain Location Back   Pain Orientation Upper;Medial;Right   Pain Descriptors / Indicators Stabbing   Pain Type Acute pain   Pain Radiating Towards N/A   Pain Onset 1 to 4 weeks ago   Pain Frequency Occasional   Aggravating Factors  laying on back too long or sitting too long   Pain Relieving Factors heat and changing positon   Effect of Pain on Daily Activities Pt pushes the pain    Multiple Pain Sites No            [facility] OT Assessment - 12/26/16 1432      Assessment   Diagnosis Right shoulder pain     Precautions   Precautions Other (comment)   Precaution Comments Pt is wheelchair bound.                   OT Treatments/Exercises (OP) - 12/26/16 1432      ADLs   ADL Comments Therapist made adjustment to wheelchair brakes as they were not holding wheels locked when brakes were on.      Exercises   Exercises Shoulder     Shoulder Exercises: Supine   Other Supine Exercises using dark green therapy ball; 12X; flexion. horizontal abduction, flexion, and PNF pattern      Shoulder Exercises: Seated   Protraction Strengthening;12 reps   Protraction Weight (lbs) 1   Horizontal ABduction Strengthening;12 reps   Horizontal ABduction Weight (lbs) 1   External Rotation Strengthening;12 reps   External Rotation Weight (lbs) 1   Internal Rotation Strengthening;12 reps   Internal Rotation Weight (lbs) 1   Flexion Strengthening;12 reps   Flexion Weight (lbs) 1   Abduction Strengthening;12 reps   ABduction  Weight (lbs) 1     Shoulder Exercises: Prone   Flexion Strengthening;12 reps   Flexion Weight (lbs) 1   Other Prone Exercises Scaption; 1#; 12X     Manual Therapy   Manual Therapy Myofascial release   Manual therapy comments Manual therapy completed prior to exercises   Myofascial Release Myofascial release and manual stretching completed to the right upper arm, trapezius, and scapularis region to decrease fascial restrictions and increase joint mobility in a pain free zone.                   OT Short Term Goals - 12/12/16 1052      OT SHORT TERM GOAL #1   Title Patient will be educated and indepdent with HEP to increase functional mobility of RUE.   Time 4   Period Weeks   Status On-going     OT SHORT TERM GOAL #2   Title Patient will increase A/ROM of RUE to WNL to increase ability to reachi up overhead for items.    Time 4    Period Weeks   Status On-going     OT SHORT TERM GOAL #3   Title Patient will increase RUE strength to 5/5 to increase ability to seld propell wheelchair with less pain.    Time 4   Period Weeks   Status On-going     OT SHORT TERM GOAL #4   Title Patient will decrease pain level to 2/10 when using RUE.    Time 4   Period Weeks   Status On-going     OT SHORT TERM GOAL #5   Title Patient will decrease fascial restrictions to min amount or less to increase functional mobility of RUE.    Time 4   Period Weeks   Status On-going                  Plan - 12/26/16 1452    Clinical Impression Statement A: Pt reports being tired and nauseous. Patient was monitored during treatment session and exercises were modified if needed. Therapist completed safety adjustments to patient's wheelchair as the breaks would not stop the wheels from rolling when locked.    Plan P: Resume W arms. Possible reassessment if next session is her last.       Patient will benefit from skilled therapeutic intervention in order to improve the following deficits and impairments:  Decreased strength, Pain, Decreased range of motion, Decreased coordination, Impaired UE functional use, Increased fascial restricitons  Visit Diagnosis: Acute pain of right shoulder  Other symptoms and signs involving the musculoskeletal system  Stiffness of right shoulder, not elsewhere classified    Problem List Patient Active Problem List   Diagnosis Date Noted  . Pyelonephritis 07/31/2015  . Back pain 07/31/2015  . Abdominal pain 07/31/2015  . Constipation 07/31/2015  . Major depressive disorder, single episode, severe without psychotic features ([place]) 05/15/2015  . Spina bifida ([place]) 01/18/2012  . ESRD (end stage renal disease) on dialysis ([place]) 05/02/2011  . Anemia 05/02/2011  . HTN (hypertension) 05/02/2011   Ailene Ravel, OTR/L,CBIS  [phone]  12/26/2016, 3:00 PM  [place], [place], [address] Phone: [phone]   Fax:  [phone]  Name: [name] MRN: [id number] Date of Birth: Oct 01, 1990   OCCUPATIONAL THERAPY DISCHARGE SUMMARY (02/16/17)  Visits from Start of Care: 6  Current functional level related to goals / functional outcomes: Patient did not return to therapy  since last session. Patient overall did show improvements with ROM, strength, and decreasing pain level.    Remaining deficits: Slight deficits with strength and pain. Unsure how many goals would have been met at last session although patient was making great progress.    Education / Equipment: NO education completed at last session.  Plan: Patient agrees to discharge.  Patient goals were partially met. Patient is being discharged due to not returning since the last visit.  ?????         Ailene Ravel, OTR/L,CBIS  [phone]

## 2016-12-27 DIAGNOSIS — D631 Anemia in chronic kidney disease: Secondary | ICD-10-CM | POA: Diagnosis not present

## 2016-12-27 DIAGNOSIS — N2581 Secondary hyperparathyroidism of renal origin: Secondary | ICD-10-CM | POA: Diagnosis not present

## 2016-12-27 DIAGNOSIS — N186 End stage renal disease: Secondary | ICD-10-CM | POA: Diagnosis not present

## 2016-12-27 DIAGNOSIS — N390 Urinary tract infection, site not specified: Secondary | ICD-10-CM | POA: Diagnosis not present

## 2016-12-30 DIAGNOSIS — N2581 Secondary hyperparathyroidism of renal origin: Secondary | ICD-10-CM | POA: Diagnosis not present

## 2016-12-30 DIAGNOSIS — N390 Urinary tract infection, site not specified: Secondary | ICD-10-CM | POA: Diagnosis not present

## 2016-12-30 DIAGNOSIS — N186 End stage renal disease: Secondary | ICD-10-CM | POA: Diagnosis not present

## 2016-12-30 DIAGNOSIS — D631 Anemia in chronic kidney disease: Secondary | ICD-10-CM | POA: Diagnosis not present

## 2016-12-31 ENCOUNTER — Ambulatory Visit (HOSPITAL_COMMUNITY): Payer: Medicare Other

## 2017-01-01 ENCOUNTER — Telehealth (HOSPITAL_COMMUNITY): Payer: Self-pay | Admitting: Nephrology

## 2017-01-01 DIAGNOSIS — N186 End stage renal disease: Secondary | ICD-10-CM | POA: Diagnosis not present

## 2017-01-01 DIAGNOSIS — N390 Urinary tract infection, site not specified: Secondary | ICD-10-CM | POA: Diagnosis not present

## 2017-01-01 DIAGNOSIS — N2581 Secondary hyperparathyroidism of renal origin: Secondary | ICD-10-CM | POA: Diagnosis not present

## 2017-01-01 DIAGNOSIS — D631 Anemia in chronic kidney disease: Secondary | ICD-10-CM | POA: Diagnosis not present

## 2017-01-01 NOTE — Telephone Encounter (Signed)
01/01/17 I called to see about rescheduling patient since she chose to cx her last appt via the phone tree.  I left her a message and also let her know that there are no more appts scheduled at this time for her.

## 2017-01-02 DIAGNOSIS — D352 Benign neoplasm of pituitary gland: Secondary | ICD-10-CM | POA: Diagnosis not present

## 2017-01-03 DIAGNOSIS — N186 End stage renal disease: Secondary | ICD-10-CM | POA: Diagnosis not present

## 2017-01-03 DIAGNOSIS — N390 Urinary tract infection, site not specified: Secondary | ICD-10-CM | POA: Diagnosis not present

## 2017-01-03 DIAGNOSIS — D631 Anemia in chronic kidney disease: Secondary | ICD-10-CM | POA: Diagnosis not present

## 2017-01-03 DIAGNOSIS — N2581 Secondary hyperparathyroidism of renal origin: Secondary | ICD-10-CM | POA: Diagnosis not present

## 2017-01-06 DIAGNOSIS — N390 Urinary tract infection, site not specified: Secondary | ICD-10-CM | POA: Diagnosis not present

## 2017-01-06 DIAGNOSIS — N186 End stage renal disease: Secondary | ICD-10-CM | POA: Diagnosis not present

## 2017-01-06 DIAGNOSIS — N2581 Secondary hyperparathyroidism of renal origin: Secondary | ICD-10-CM | POA: Diagnosis not present

## 2017-01-06 DIAGNOSIS — D631 Anemia in chronic kidney disease: Secondary | ICD-10-CM | POA: Diagnosis not present

## 2017-01-07 ENCOUNTER — Encounter: Payer: Self-pay | Admitting: Nurse Practitioner

## 2017-01-07 ENCOUNTER — Ambulatory Visit (INDEPENDENT_AMBULATORY_CARE_PROVIDER_SITE_OTHER): Payer: Medicare Other | Admitting: Nurse Practitioner

## 2017-01-07 VITALS — BP 110/67 | HR 106 | Temp 100.3°F

## 2017-01-07 DIAGNOSIS — K219 Gastro-esophageal reflux disease without esophagitis: Secondary | ICD-10-CM

## 2017-01-07 DIAGNOSIS — K5909 Other constipation: Secondary | ICD-10-CM | POA: Diagnosis not present

## 2017-01-07 DIAGNOSIS — R112 Nausea with vomiting, unspecified: Secondary | ICD-10-CM | POA: Diagnosis not present

## 2017-01-07 HISTORY — DX: Gastro-esophageal reflux disease without esophagitis: K21.9

## 2017-01-07 MED ORDER — ONDANSETRON HCL 4 MG PO TABS: 4.0000 mg | ORAL_TABLET | Freq: Three times a day (TID) | ORAL | 2 refills | Status: DC | PRN

## 2017-01-07 MED ORDER — LINACLOTIDE 72 MCG PO CAPS: 72.0000 ug | ORAL_CAPSULE | Freq: Every day | ORAL | 0 refills | Status: DC

## 2017-01-07 MED ORDER — PANTOPRAZOLE SODIUM 40 MG PO TBEC: 40.0000 mg | DELAYED_RELEASE_TABLET | Freq: Two times a day (BID) | ORAL | 3 refills | Status: DC

## 2017-01-07 NOTE — Assessment & Plan Note (Signed)
The patient likely has constipation. Has a bowel movement every 1-3 days, but describes stools are difficult to pass and require straining. Additionally, she was recently in the emergency room for nausea and vomiting and it was noted that she was impacted and required manual disimpaction of a moderate amount of stool. She is tried multiple over-the-counter medications which have not helped. I will start her on Linzess 72 g once a and provide samples for 2 weeks and request a progress report in 1-2 weeks. This could be causing a portion of her nausea as well. Return for follow-up in 2 months otherwise.

## 2017-01-07 NOTE — Assessment & Plan Note (Signed)
The patient describes GERD/dyspepsia symptoms which tends to occur in conjunction with her nausea. She was on Prilosec once a day and this was recently increased to twice a day with no improvement. At this point I will stop Prilosec and start her on Protonix 40 mg twice a day to see if this helps her GERD and, subsequently, her nausea. Return for follow-up in 2 months.

## 2017-01-07 NOTE — Assessment & Plan Note (Signed)
The patient describes frequent nausea and occasional vomiting. This is likely multifactorial in nature. She does have GERD/dyspepsia symptoms as well as likely constipation, both of which can cause nausea. Further management of these as per above. I will send an Zofran to her pharmacy as well to help with symptomatic management. If we can get her GERD and constipation under control and she remains nauseated, we can consider further evaluation such as a gastric emptying study to evaluate for gastroparesis. Return for follow-up in 2 months. She is to call us if she has any worsening or severe symptoms.

## 2017-01-07 NOTE — Progress Notes (Signed)
Primary Care Physician:  Estanislado Emms, MD Primary Gastroenterologist:  Dr. Oneida Alar  Chief Complaint  Patient presents with  . Abdominal Pain  . Nausea  . Emesis    HPI:   [name] is a 26 y.o. female who presents on referral from primary care for abdominal pain, nausea, vomiting. The patient was recently seen in the emergency department on 09/15/2016 for bowel impaction. The patient was disimpacted in the emergency room with the removal of moderate amount of stool and tolerated well. History of frequent urinary tract infections. CT with hydronephrotic horseshoe kidney, some increasing hydronephrosis since prior CT, no stones, large impacted feces in the rectosigmoid colon, consistency with spina bifida and neurogenic bladder. She was given IV antibiotics for UTI. Discussed use of a stool softener. Her hemoglobin was noted to be low at 10.9. Lipase normal. She is end-stage renal disease on dialysis.  Today she states her N/V has been ongoing "for a while" but improved with GERD medication a year ago. Symptoms worsened again about a month ago. Is nearly constantly nauseated, frequently vomits at dialysis. Also has occasional vomiting at home as well after eating. Has a bowel movement about every day to every 3 days. Stools tend to be hard and require straining. Denies hematochezia, melena, fever, chills, unintentional weight loss. Has been having chest pain and she notified the dialysis PA and was told she'd be referred for her chest pain, but nobody has contacted her about it. Chest pain typically last for a few seconds and self resolves. Has dyspepsia symptoms include esophageal burning and bitter/sour taste and tends to occur in conjunction with her nausea. .Denies chest pain, dyspnea, dizziness, lightheadedness, syncope, near syncope. Denies any other upper or lower GI symptoms.  Past Medical History:  Diagnosis Date  . Anemia associated with chronic renal failure   . Blood  transfusion   . 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  . Depression 05/14/2015  . Dialysis care   . ESRD (end stage renal disease) on dialysis ([place])   . HTN (hypertension) 05/02/2011  . Spina bifida   . UTI (lower urinary tract infection)     Past Surgical History:  Procedure Laterality Date  . AV FISTULA PLACEMENT     left arm    Current Outpatient Prescriptions  Medication Sig Dispense Refill  . cinacalcet (SENSIPAR) 30 MG tablet Take 30 mg by mouth every other day.     Marland Kitchen Epoetin Alfa (EPOGEN IJ) Inject as directed. Pt gets on dialysis days which are Tuesday, Thursday, and Saturday.    . multivitamin (RENA-VIT) TABS tablet Take 1 tablet by mouth every Tuesday, Thursday, and Saturday at 6 PM.    . omeprazole (PRILOSEC) 20 MG capsule Take 20 mg by mouth daily.  3  . sevelamer carbonate (RENVELA) 800 MG tablet Take 800 mg by mouth 3 (three) times daily with meals.      No current facility-administered medications for this visit.     Allergies as of 01/07/2017 - Review Complete 01/07/2017  Allergen Reaction Noted  . Ciprofloxacin Shortness Of Breath, Nausea And Vomiting, and Other (See Comments) 05/02/2011  . Other Anaphylaxis 05/02/2011  . Peanut-containing drug products Anaphylaxis 11/21/2011  . Aleve [naproxen sodium] Other (See Comments) 03/29/2012  . Ceftriaxone Other (See Comments) 09/15/2016  . Coconut oil Hives 12/05/2016  . Influenza vaccines Nausea And Vomiting 11/21/2011  . Tetanus toxoids Nausea And Vomiting 05/02/2011  . Latex Itching and Rash 05/02/2011  Family History  Problem Relation Age of Onset  . Kidney cancer Other   . Hypertension Maternal Grandmother   . Arthritis Maternal Grandmother   . Breast cancer Maternal Aunt   . Colon cancer Neg Hx     Social History   Social History  . Marital status: Single    Spouse name: N/A  . Number of children: N/A  . Years of education: N/A   Occupational History  . Not on file.    Social History Main Topics  . Smoking status: Current Every Day Smoker    Packs/day: 0.10  . Smokeless tobacco: Never Used     Comment: 2 cigs a day  . Alcohol use No  . Drug use: No  . Sexual activity: No   Other Topics Concern  . Not on file   Social History Narrative  . No narrative on file    Review of Systems: Complete ROS negative except as per HPI.    Physical Exam: BP 110/67   Pulse (!) 106   Temp 100.3 F (37.9 C)  General:   Alert and oriented. Pleasant and cooperative. Well-nourished and well-developed. Sitting in a wheelchair.  Head:  Normocephalic and atraumatic. Eyes:  Without icterus, sclera clear and conjunctiva pink.  Ears:  Normal auditory acuity. Cardiovascular:  S1, S2 present without murmurs appreciated. Extremities without clubbing or edema. Respiratory:  Clear to auscultation bilaterally. No wheezes, rales, or rhonchi. No distress.  Gastrointestinal:  +BS, soft, non-tender and non-distended. No HSM noted. No guarding or rebound. No masses appreciated.  Rectal:  Deferred  Musculoskalatal:  Symmetrical without gross deformities. Normal posture. Skin:  Intact without significant lesions or rashes. Left upper arm AV fistula +bruit/thrill. Neurologic:  Alert and oriented x4;  grossly normal neurologically. Psych:  Alert and cooperative. Normal mood and affect. Heme/Lymph/Immune: No excessive bruising noted.    01/07/2017 3:10 PM   Disclaimer: This note was dictated with voice recognition software. Similar sounding words can inadvertently be transcribed and may not be corrected upon review.

## 2017-01-07 NOTE — Patient Instructions (Signed)
1. Stop taking Prilosec. 2. I have sent a prescription for Protonix 40 mg to your pharmacy. Take this twice a day, 30 minutes before meal. 3. I sent in a prescription of Zofran 4 mg to your pharmacy. You can take this every 8 hours as needed for nausea. 4. I am giving you samples of Linzess 72 g pills. Take this once a day, on an empty stomach to help with constipation. 5. Call us in 1-2 weeks and let us know if Linzess is helping. 6. Return for follow-up in 2 months. 7. Call us if you have any worsening or severe symptoms.

## 2017-01-08 DIAGNOSIS — N390 Urinary tract infection, site not specified: Secondary | ICD-10-CM | POA: Diagnosis not present

## 2017-01-08 DIAGNOSIS — N2581 Secondary hyperparathyroidism of renal origin: Secondary | ICD-10-CM | POA: Diagnosis not present

## 2017-01-08 DIAGNOSIS — D631 Anemia in chronic kidney disease: Secondary | ICD-10-CM | POA: Diagnosis not present

## 2017-01-08 DIAGNOSIS — N186 End stage renal disease: Secondary | ICD-10-CM | POA: Diagnosis not present

## 2017-01-08 NOTE — Progress Notes (Signed)
CC'ED TO PCP 

## 2017-01-10 DIAGNOSIS — N390 Urinary tract infection, site not specified: Secondary | ICD-10-CM | POA: Diagnosis not present

## 2017-01-10 DIAGNOSIS — N186 End stage renal disease: Secondary | ICD-10-CM | POA: Diagnosis not present

## 2017-01-10 DIAGNOSIS — N2581 Secondary hyperparathyroidism of renal origin: Secondary | ICD-10-CM | POA: Diagnosis not present

## 2017-01-10 DIAGNOSIS — D631 Anemia in chronic kidney disease: Secondary | ICD-10-CM | POA: Diagnosis not present

## 2017-01-13 DIAGNOSIS — D631 Anemia in chronic kidney disease: Secondary | ICD-10-CM | POA: Diagnosis not present

## 2017-01-13 DIAGNOSIS — N2581 Secondary hyperparathyroidism of renal origin: Secondary | ICD-10-CM | POA: Diagnosis not present

## 2017-01-13 DIAGNOSIS — N390 Urinary tract infection, site not specified: Secondary | ICD-10-CM | POA: Diagnosis not present

## 2017-01-13 DIAGNOSIS — N186 End stage renal disease: Secondary | ICD-10-CM | POA: Diagnosis not present

## 2017-01-15 DIAGNOSIS — N390 Urinary tract infection, site not specified: Secondary | ICD-10-CM | POA: Diagnosis not present

## 2017-01-15 DIAGNOSIS — N2581 Secondary hyperparathyroidism of renal origin: Secondary | ICD-10-CM | POA: Diagnosis not present

## 2017-01-15 DIAGNOSIS — D631 Anemia in chronic kidney disease: Secondary | ICD-10-CM | POA: Diagnosis not present

## 2017-01-15 DIAGNOSIS — N186 End stage renal disease: Secondary | ICD-10-CM | POA: Diagnosis not present

## 2017-01-17 DIAGNOSIS — D631 Anemia in chronic kidney disease: Secondary | ICD-10-CM | POA: Diagnosis not present

## 2017-01-17 DIAGNOSIS — N390 Urinary tract infection, site not specified: Secondary | ICD-10-CM | POA: Diagnosis not present

## 2017-01-17 DIAGNOSIS — N2581 Secondary hyperparathyroidism of renal origin: Secondary | ICD-10-CM | POA: Diagnosis not present

## 2017-01-17 DIAGNOSIS — N186 End stage renal disease: Secondary | ICD-10-CM | POA: Diagnosis not present

## 2017-01-20 DIAGNOSIS — N2581 Secondary hyperparathyroidism of renal origin: Secondary | ICD-10-CM | POA: Diagnosis not present

## 2017-01-20 DIAGNOSIS — D631 Anemia in chronic kidney disease: Secondary | ICD-10-CM | POA: Diagnosis not present

## 2017-01-20 DIAGNOSIS — N186 End stage renal disease: Secondary | ICD-10-CM | POA: Diagnosis not present

## 2017-01-20 DIAGNOSIS — N390 Urinary tract infection, site not specified: Secondary | ICD-10-CM | POA: Diagnosis not present

## 2017-01-22 DIAGNOSIS — D631 Anemia in chronic kidney disease: Secondary | ICD-10-CM | POA: Diagnosis not present

## 2017-01-22 DIAGNOSIS — N2581 Secondary hyperparathyroidism of renal origin: Secondary | ICD-10-CM | POA: Diagnosis not present

## 2017-01-22 DIAGNOSIS — N186 End stage renal disease: Secondary | ICD-10-CM | POA: Diagnosis not present

## 2017-01-22 DIAGNOSIS — N390 Urinary tract infection, site not specified: Secondary | ICD-10-CM | POA: Diagnosis not present

## 2017-01-24 DIAGNOSIS — Z992 Dependence on renal dialysis: Secondary | ICD-10-CM | POA: Diagnosis not present

## 2017-01-24 DIAGNOSIS — N390 Urinary tract infection, site not specified: Secondary | ICD-10-CM | POA: Diagnosis not present

## 2017-01-24 DIAGNOSIS — D631 Anemia in chronic kidney disease: Secondary | ICD-10-CM | POA: Diagnosis not present

## 2017-01-24 DIAGNOSIS — N186 End stage renal disease: Secondary | ICD-10-CM | POA: Diagnosis not present

## 2017-01-24 DIAGNOSIS — N2581 Secondary hyperparathyroidism of renal origin: Secondary | ICD-10-CM | POA: Diagnosis not present

## 2017-01-24 DIAGNOSIS — I120 Hypertensive chronic kidney disease with stage 5 chronic kidney disease or end stage renal disease: Secondary | ICD-10-CM | POA: Diagnosis not present

## 2017-01-27 DIAGNOSIS — N2581 Secondary hyperparathyroidism of renal origin: Secondary | ICD-10-CM | POA: Diagnosis not present

## 2017-01-27 DIAGNOSIS — N186 End stage renal disease: Secondary | ICD-10-CM | POA: Diagnosis not present

## 2017-01-29 DIAGNOSIS — N2581 Secondary hyperparathyroidism of renal origin: Secondary | ICD-10-CM | POA: Diagnosis not present

## 2017-01-29 DIAGNOSIS — N186 End stage renal disease: Secondary | ICD-10-CM | POA: Diagnosis not present

## 2017-01-31 DIAGNOSIS — N186 End stage renal disease: Secondary | ICD-10-CM | POA: Diagnosis not present

## 2017-01-31 DIAGNOSIS — N2581 Secondary hyperparathyroidism of renal origin: Secondary | ICD-10-CM | POA: Diagnosis not present

## 2017-02-01 ENCOUNTER — Encounter (HOSPITAL_COMMUNITY): Payer: Self-pay | Admitting: *Deleted

## 2017-02-01 ENCOUNTER — Emergency Department (HOSPITAL_COMMUNITY)
Admission: EM | Admit: 2017-02-01 | Discharge: 2017-02-01 | Disposition: A | Discharge status: Home / Self Care | Payer: Medicare Other | Attending: Emergency Medicine | Admitting: Emergency Medicine

## 2017-02-01 DIAGNOSIS — Z992 Dependence on renal dialysis: Secondary | ICD-10-CM | POA: Insufficient documentation

## 2017-02-01 DIAGNOSIS — Z91010 Allergy to peanuts: Secondary | ICD-10-CM | POA: Insufficient documentation

## 2017-02-01 DIAGNOSIS — F17210 Nicotine dependence, cigarettes, uncomplicated: Secondary | ICD-10-CM | POA: Diagnosis not present

## 2017-02-01 DIAGNOSIS — H6002 Abscess of left external ear: Secondary | ICD-10-CM | POA: Insufficient documentation

## 2017-02-01 DIAGNOSIS — I120 Hypertensive chronic kidney disease with stage 5 chronic kidney disease or end stage renal disease: Secondary | ICD-10-CM | POA: Diagnosis not present

## 2017-02-01 DIAGNOSIS — Z79899 Other long term (current) drug therapy: Secondary | ICD-10-CM | POA: Insufficient documentation

## 2017-02-01 DIAGNOSIS — N186 End stage renal disease: Secondary | ICD-10-CM | POA: Diagnosis not present

## 2017-02-01 DIAGNOSIS — Z91040 Latex allergy status: Secondary | ICD-10-CM | POA: Diagnosis not present

## 2017-02-01 DIAGNOSIS — R600 Localized edema: Secondary | ICD-10-CM | POA: Diagnosis present

## 2017-02-01 MED ORDER — LIDOCAINE HCL (PF) 1 % IJ SOLN
INTRAMUSCULAR | Status: AC
  Administered 2017-02-01: 5 mL
  Filled 2017-02-01: qty 5

## 2017-02-01 MED ORDER — POVIDONE-IODINE 10 % EX SOLN
CUTANEOUS | Status: AC
  Administered 2017-02-01: 1 via TOPICAL
  Filled 2017-02-01: qty 118

## 2017-02-01 MED ORDER — ONDANSETRON 4 MG PO TBDP
4.0000 mg | ORAL_TABLET | Freq: Once | ORAL | Status: AC
  Administered 2017-02-01: 4 mg via ORAL

## 2017-02-01 MED ORDER — POVIDONE-IODINE 10 % EX SOLN
CUTANEOUS | Status: DC | PRN
  Administered 2017-02-01: 1 via TOPICAL

## 2017-02-01 MED ORDER — BENZOCAINE (TOPICAL) 20 % EX AERO
INHALATION_SPRAY | Freq: Four times a day (QID) | CUTANEOUS | Status: DC | PRN
  Filled 2017-02-01: qty 57

## 2017-02-01 MED ORDER — ONDANSETRON 4 MG PO TBDP
ORAL_TABLET | ORAL | Status: AC
  Filled 2017-02-01: qty 1

## 2017-02-01 MED ORDER — HYDROCODONE-ACETAMINOPHEN 5-325 MG PO TABS
1.0000 | ORAL_TABLET | Freq: Once | ORAL | Status: AC
  Administered 2017-02-01: 1 via ORAL
  Filled 2017-02-01: qty 1

## 2017-02-01 MED ORDER — LIDOCAINE-PRILOCAINE 2.5-2.5 % EX CREA
TOPICAL_CREAM | Freq: Once | CUTANEOUS | Status: AC
  Administered 2017-02-01: 06:00:00 via TOPICAL
  Filled 2017-02-01: qty 5

## 2017-02-01 MED ORDER — DOXYCYCLINE HYCLATE 100 MG PO CAPS: 100.0000 mg | ORAL_CAPSULE | Freq: Two times a day (BID) | ORAL | 0 refills | Status: DC

## 2017-02-01 MED ORDER — ONDANSETRON 4 MG PO TBDP
ORAL_TABLET | ORAL | Status: AC
  Administered 2017-02-01: 4 mg via ORAL
  Filled 2017-02-01: qty 1

## 2017-02-01 MED ORDER — DOXYCYCLINE HYCLATE 100 MG PO TABS
100.0000 mg | ORAL_TABLET | Freq: Once | ORAL | Status: AC
  Administered 2017-02-01: 100 mg via ORAL
  Filled 2017-02-01: qty 1

## 2017-02-01 MED ORDER — HYDROCODONE-ACETAMINOPHEN 5-325 MG PO TABS: 1.0000 | ORAL_TABLET | Freq: Four times a day (QID) | ORAL | 0 refills | Status: DC | PRN

## 2017-02-01 MED ORDER — BENZOCAINE 20 % MT AERO
INHALATION_SPRAY | OROMUCOSAL | Status: AC
  Administered 2017-02-01: 1
  Filled 2017-02-01: qty 57

## 2017-02-01 NOTE — ED Provider Notes (Signed)
[facility] DEPT Provider Note   CSN: [id number] Arrival date & time: 02/01/17  0058     History   Chief Complaint Chief Complaint  Patient presents with  . Facial Swelling    HPI [name] is a 26 y.o. female.  The history is provided by the patient.  Otalgia  This is a new problem. The current episode started 12 to 24 hours ago. There is pain in the left ear. The problem occurs constantly. The problem has been gradually worsening. There has been no fever. The pain is moderate. Pertinent negatives include no ear discharge.  pt with h/o ESRD, spina bifida, presents with left ear pain/swelling It hurts to palpate her ear No trauma No drainage from ear She has some pain in her face as well She has never had this before She thought it might be a bug bite, but unsure what caused She is on dialysis but no missed sessions  Past Medical History:  Diagnosis Date  . Anemia associated with chronic renal failure   . Blood transfusion   . 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  . Depression 05/14/2015  . Dialysis care   . ESRD (end stage renal disease) on dialysis ([place])   . GERD (gastroesophageal reflux disease) 01/07/2017  . HTN (hypertension) 05/02/2011  . Spina bifida   . UTI (lower urinary tract infection)     Patient Active Problem List   Diagnosis Date Noted  . GERD (gastroesophageal reflux disease) 01/07/2017  . Nausea with vomiting 01/07/2017  . Pyelonephritis 07/31/2015  . Back pain 07/31/2015  . Abdominal pain 07/31/2015  . Constipation 07/31/2015  . Major depressive disorder, single episode, severe without psychotic features ([place]) 05/15/2015  . Spina bifida ([place]) 01/18/2012  . ESRD (end stage renal disease) on dialysis ([place]) 05/02/2011  . Anemia 05/02/2011  . HTN (hypertension) 05/02/2011    Past Surgical History:  Procedure Laterality Date  . AV FISTULA PLACEMENT     left arm    OB History    Gravida Para Term Preterm AB Living    0 0 0 0 0 0   SAB TAB Ectopic Multiple Live Births   0 0 0 0 0       Home Medications    Prior to Admission medications   Medication Sig Start Date End Date Taking? Authorizing Provider  cinacalcet (SENSIPAR) 30 MG tablet Take 30 mg by mouth every other day.    Yes [provider]  Epoetin Alfa (EPOGEN IJ) Inject as directed. Pt gets on dialysis days which are Tuesday, Thursday, and Saturday.   Yes [provider]  linaclotide Rolan Lipa) 72 MCG capsule Take 1 capsule (72 mcg total) by mouth daily before breakfast. 01/07/17  Yes Carlis Stable, NP  multivitamin (RENA-VIT) TABS tablet Take 1 tablet by mouth every Tuesday, Thursday, and Saturday at 6 PM.   Yes [provider]  omeprazole (PRILOSEC) 20 MG capsule Take 20 mg by mouth daily. 03/26/16  Yes [provider]  ondansetron (ZOFRAN) 4 MG tablet Take 1 tablet (4 mg total) by mouth every 8 (eight) hours as needed for nausea or vomiting. 01/07/17  Yes Carlis Stable, NP  pantoprazole (PROTONIX) 40 MG tablet Take 1 tablet (40 mg total) by mouth 2 (two) times daily before a meal. 01/07/17  Yes Carlis Stable, NP  sevelamer carbonate (RENVELA) 800 MG tablet Take 800 mg by mouth 3 (three) times daily with meals.    Yes  [provider]    Family History Family History  Problem Relation Age of Onset  . Kidney cancer Other   . Hypertension Maternal Grandmother   . Arthritis Maternal Grandmother   . Breast cancer Maternal Aunt   . Colon cancer Neg Hx     Social History Social History  Substance Use Topics  . Smoking status: Current Every Day Smoker    Packs/day: 0.10    Types: Cigarettes  . Smokeless tobacco: Never Used     Comment: 2 cigs a day  . Alcohol use No     Allergies   Ciprofloxacin; Other; Peanut-containing drug products; Aleve [naproxen sodium]; Ceftriaxone; Coconut oil; Influenza vaccines; Tetanus toxoids; and Latex   Review of Systems Review of Systems  Constitutional:  Negative for fever.  HENT: Positive for ear pain. Negative for ear discharge.   Gastrointestinal: Positive for nausea.     Physical Exam Updated Vital Signs BP 122/79 (BP Location: Right Arm)   Pulse (!) 119   Temp 99.2 F (37.3 C) (Oral)   Resp 20   Wt 22.7 kg (50 lb)   SpO2 100%   BMI 61.03 kg/m   Physical Exam CONSTITUTIONAL: Chronically ill appearing HEAD: Normocephalic/atraumatic EYES: EOMI/PERRL ENMT: Mucous membranes moist, no trismus, no dental abscess, no dental tenderness Abscess noted to left earlobe.  Left TM intact No facial edema noted NECK: supple no meningeal signs CV: S1/S2 noted, no murmurs/rubs/gallops noted LUNGS: Lungs are clear to auscultation bilaterally, no apparent distress ABDOMEN: soft NEURO: Pt is awake/alert/appropriate PSYCH: no abnormalities of mood noted, alert and oriented to situation   ED Treatments / Results  Labs (all labs ordered are listed, but only abnormal results are displayed) Labs Reviewed - No data to display  EKG  EKG Interpretation None       Radiology No results found.  Procedures Procedures  INCISION AND DRAINAGE Performed by: Sharyon Cable Consent: Verbal consent obtained. Risks and benefits: risks, benefits and alternatives were discussed Type: abscess  Body area: left ear lobe  Anesthesia: local infiltration  Incision was made with a scalpel.  Local anesthetic: lidocaine % without epinephrine  Anesthetic total: 2 ml  Complexity: complex  Drainage: purulent   Patient tolerance: Patient tolerated the procedure well with no immediate complications.    Medications Ordered in ED Medications  povidone-iodine (BETADINE) 10 % external solution (not administered)  lidocaine-prilocaine (EMLA) cream (not administered)  lidocaine (PF) (XYLOCAINE) 1 % injection (not administered)  doxycycline (VIBRA-TABS) tablet 100 mg (not administered)  HYDROcodone-acetaminophen (NORCO/VICODIN) 5-325 MG per  tablet 1 tablet (not administered)  HYDROcodone-acetaminophen (NORCO/VICODIN) 5-325 MG per tablet 1 tablet (1 tablet Oral Given 02/01/17 0342)  Benzocaine (HURRCAINE) 20 % mouth spray (1 application  Given 10/04/74 0343)     Initial Impression / Assessment and Plan / ED Course  I have reviewed the triage vital signs and the nursing notes.      5:54 AM Pt has abscess to left earlobe.   Difficult to anesthetize.  Initially tried topical meds and attempted needle drainage without success I then injected lidocaine and made incision with blood/pus extracted Plan to start on doxycycline and encourage warm compresses 6:20 AM Pt improved Overall well appearing Plan for antibiotics Warm compresses We discussed strict return precautions  Final Clinical Impressions(s) / ED Diagnoses   Final diagnoses:  Abscess of left earlobe    New Prescriptions New Prescriptions   DOXYCYCLINE (VIBRAMYCIN) 100 MG CAPSULE    Take 1 capsule (100 mg total) by  mouth 2 (two) times daily. One po bid x 7 days   HYDROCODONE-ACETAMINOPHEN (NORCO/VICODIN) 5-325 MG TABLET    Take 1 tablet by mouth every 6 (six) hours as needed.     Ripley Fraise, MD 02/01/17 [phone]

## 2017-02-01 NOTE — ED Notes (Signed)
Pt continued to have some nausea. EDP aware and new orders were received for more Zofran. Pt requesting that after she took this med to wheel her outside for fresh air. Pt ready to be discharged and was wheeled out to the front.

## 2017-02-01 NOTE — ED Triage Notes (Signed)
Pt states she got bit yesterday on the ear. Noted redness & swelling left ear.

## 2017-02-01 NOTE — ED Notes (Signed)
Suture cart at bedside 

## 2017-02-02 ENCOUNTER — Telehealth: Payer: Self-pay | Admitting: Nephrology

## 2017-02-02 DIAGNOSIS — L03211 Cellulitis of face: Secondary | ICD-10-CM | POA: Diagnosis not present

## 2017-02-02 NOTE — Telephone Encounter (Signed)
Called pt post 7/7 ED visit.  Pt states ear abscess not better and experiencing unusual nausea.  Was told by ED staff over weekend to return to ER on Tues if not better.  During phone call, Patient agreed to go see PCP today for follow-up appt.  Transport arranged and confirmed for pt to see Dr. Criss Rosales at 4:45 7/9.

## 2017-02-03 DIAGNOSIS — N2581 Secondary hyperparathyroidism of renal origin: Secondary | ICD-10-CM | POA: Diagnosis not present

## 2017-02-03 DIAGNOSIS — N186 End stage renal disease: Secondary | ICD-10-CM | POA: Diagnosis not present

## 2017-02-05 DIAGNOSIS — N2581 Secondary hyperparathyroidism of renal origin: Secondary | ICD-10-CM | POA: Diagnosis not present

## 2017-02-05 DIAGNOSIS — N186 End stage renal disease: Secondary | ICD-10-CM | POA: Diagnosis not present

## 2017-02-07 DIAGNOSIS — N2581 Secondary hyperparathyroidism of renal origin: Secondary | ICD-10-CM | POA: Diagnosis not present

## 2017-02-07 DIAGNOSIS — N186 End stage renal disease: Secondary | ICD-10-CM | POA: Diagnosis not present

## 2017-02-10 DIAGNOSIS — N2581 Secondary hyperparathyroidism of renal origin: Secondary | ICD-10-CM | POA: Diagnosis not present

## 2017-02-10 DIAGNOSIS — N186 End stage renal disease: Secondary | ICD-10-CM | POA: Diagnosis not present

## 2017-02-11 ENCOUNTER — Other Ambulatory Visit: Payer: Self-pay | Admitting: *Deleted

## 2017-02-11 NOTE — Patient Outreach (Signed)
[facility]) Care Management  02/11/2017  [name] 1991-01-04 [id number]  Referral via [facility] w/ [place]; Patient needs social worker; housing, back to school, determining ability to drive:  Discussed referral with Surveyor, quantity.  Telephone call to [place]; left message on voice mail requesting call back.  Plan: Will follow up.  Sherrin Daisy, RN BSN [place] Management Coordinator [facility] Care Management  [phone]

## 2017-02-12 ENCOUNTER — Other Ambulatory Visit: Payer: Self-pay | Admitting: *Deleted

## 2017-02-12 DIAGNOSIS — N2581 Secondary hyperparathyroidism of renal origin: Secondary | ICD-10-CM | POA: Diagnosis not present

## 2017-02-12 DIAGNOSIS — N186 End stage renal disease: Secondary | ICD-10-CM | POA: Diagnosis not present

## 2017-02-12 NOTE — Patient Outreach (Signed)
[facility]) Care Management  02/12/2017  [name] [birth year] [id number]  Received return call from [place]. Clarification of referral request received.  [facility] scope of practice discussed with Care Guide & understanding was voiced.  Care guide advised that patient has Education officer, museum at dialysis center. Also advised did not know if patient had Medicaid case worker.  Was also advised that patient speaks on behalf of herself.     Plan: Will follow up with patient. Sherrin Daisy, RN BSN [place] Management Coordinator [facility] Care Management  [phone]

## 2017-02-13 DIAGNOSIS — N186 End stage renal disease: Secondary | ICD-10-CM | POA: Diagnosis not present

## 2017-02-13 DIAGNOSIS — N2581 Secondary hyperparathyroidism of renal origin: Secondary | ICD-10-CM | POA: Diagnosis not present

## 2017-02-16 ENCOUNTER — Other Ambulatory Visit: Payer: Self-pay | Admitting: *Deleted

## 2017-02-18 ENCOUNTER — Other Ambulatory Visit: Payer: Self-pay | Admitting: *Deleted

## 2017-02-18 NOTE — Patient Outreach (Signed)
[facility]) Care Management  02/18/2017  [name] 22-Nov-1990 [id number]  Telephone call to patient who advised that she is not able to talk now. States she is having back & abdominal that she has had for several days.   Advised patient to seek medical assistance if pain does not yet better. States she would.   Plan: Will follow up.  Sherrin Daisy, RN BSN [place] Management Coordinator [facility] Care Management  [phone]

## 2017-02-19 DIAGNOSIS — N2581 Secondary hyperparathyroidism of renal origin: Secondary | ICD-10-CM | POA: Diagnosis not present

## 2017-02-19 DIAGNOSIS — N186 End stage renal disease: Secondary | ICD-10-CM | POA: Diagnosis not present

## 2017-02-20 ENCOUNTER — Other Ambulatory Visit: Payer: Self-pay | Admitting: *Deleted

## 2017-02-20 DIAGNOSIS — R197 Diarrhea, unspecified: Secondary | ICD-10-CM | POA: Diagnosis not present

## 2017-02-20 NOTE — Patient Outreach (Signed)
[facility]) Care Management  02/20/2017  [name] Dec 18, 1990 [id number]  Telephone call to patient.  Patient voices that she is unable talk now because she is attending MD appointment at primary care office.   Plan: Follow up; appointment set for next week. Patient agrees with appointment time.  Sherrin Daisy, RN BSN [place] Management Coordinator [facility] Care Management  [phone]

## 2017-02-21 DIAGNOSIS — N2581 Secondary hyperparathyroidism of renal origin: Secondary | ICD-10-CM | POA: Diagnosis not present

## 2017-02-21 DIAGNOSIS — N186 End stage renal disease: Secondary | ICD-10-CM | POA: Diagnosis not present

## 2017-02-23 ENCOUNTER — Ambulatory Visit: Payer: Self-pay | Admitting: *Deleted

## 2017-02-24 DIAGNOSIS — N2581 Secondary hyperparathyroidism of renal origin: Secondary | ICD-10-CM | POA: Diagnosis not present

## 2017-02-24 DIAGNOSIS — Z992 Dependence on renal dialysis: Secondary | ICD-10-CM | POA: Diagnosis not present

## 2017-02-24 DIAGNOSIS — I120 Hypertensive chronic kidney disease with stage 5 chronic kidney disease or end stage renal disease: Secondary | ICD-10-CM | POA: Diagnosis not present

## 2017-02-24 DIAGNOSIS — N186 End stage renal disease: Secondary | ICD-10-CM | POA: Diagnosis not present

## 2017-02-25 ENCOUNTER — Other Ambulatory Visit: Payer: Self-pay | Admitting: *Deleted

## 2017-02-25 NOTE — Patient Outreach (Signed)
[facility]) Care Management  02/25/2017  [name] 07/04/1991 [id number]  Telephone call to patient; left HIPPA compliant voice mail requesting call back.  Plan: Will follow up.  Sherrin Daisy, RN BSN [place] Management Coordinator [facility] Care Management  [phone]

## 2017-02-26 DIAGNOSIS — N186 End stage renal disease: Secondary | ICD-10-CM | POA: Diagnosis not present

## 2017-02-26 DIAGNOSIS — N2581 Secondary hyperparathyroidism of renal origin: Secondary | ICD-10-CM | POA: Diagnosis not present

## 2017-02-26 DIAGNOSIS — D631 Anemia in chronic kidney disease: Secondary | ICD-10-CM | POA: Diagnosis not present

## 2017-02-26 DIAGNOSIS — R300 Dysuria: Secondary | ICD-10-CM | POA: Diagnosis not present

## 2017-02-27 ENCOUNTER — Other Ambulatory Visit: Payer: Self-pay | Admitting: *Deleted

## 2017-02-27 NOTE — Patient Outreach (Signed)
[name] [facility]) Care Management  02/27/2017  [name] [birth year] [id number]   Referral from MD office via [place]: requesting Social Work referral: DX Spina Bifida-caudal regression syndrome, neurogenic bladder, End stage renal disease (on dialysis Tu, Thurs, Sat)  Telephone call to patient who was advised of reason for call & [facility] care management services.  HIPPA verification received.  Patient voices that she is independent of her care & prepares her own meals. States she lives with family member who takes her to grocery store to shop. Voices that she uses wheelchair but is able to get around at home using her hands. States she has no trouble getting up & down stairs.  States she gets to dialysis center in [place] via :[facility] and uses [facility] transportation for out of town MD appointments. States she does not drive & is not interested in learning to drive because she does not own a car  & is not planning to buy one.   States she has no problems obtaining her medications. States she manages her own medications & takes them as prescribed by her doctors.   States she makes her own decisions but does talk with her dad & other family members if she needs advice. States she is not a member of a support group & has never met any one that has spina bifida.    Patient voices that her concern is currently trying to find somewhere to live & learn more about community resources.      Patient consents to [place] [facility] care management services.  Plan: Refer to care management assistant to assign to Clinical Social Worker for [employer] (support groups, housing).  Sherrin Daisy, RN BSN [place] Management Coordinator [facility] Care Management  [phone]

## 2017-02-28 DIAGNOSIS — N2581 Secondary hyperparathyroidism of renal origin: Secondary | ICD-10-CM | POA: Diagnosis not present

## 2017-02-28 DIAGNOSIS — N186 End stage renal disease: Secondary | ICD-10-CM | POA: Diagnosis not present

## 2017-02-28 DIAGNOSIS — R300 Dysuria: Secondary | ICD-10-CM | POA: Diagnosis not present

## 2017-02-28 DIAGNOSIS — D631 Anemia in chronic kidney disease: Secondary | ICD-10-CM | POA: Diagnosis not present

## 2017-03-03 DIAGNOSIS — N186 End stage renal disease: Secondary | ICD-10-CM | POA: Diagnosis not present

## 2017-03-03 DIAGNOSIS — D631 Anemia in chronic kidney disease: Secondary | ICD-10-CM | POA: Diagnosis not present

## 2017-03-03 DIAGNOSIS — R300 Dysuria: Secondary | ICD-10-CM | POA: Diagnosis not present

## 2017-03-03 DIAGNOSIS — N2581 Secondary hyperparathyroidism of renal origin: Secondary | ICD-10-CM | POA: Diagnosis not present

## 2017-03-05 DIAGNOSIS — N186 End stage renal disease: Secondary | ICD-10-CM | POA: Diagnosis not present

## 2017-03-05 DIAGNOSIS — D631 Anemia in chronic kidney disease: Secondary | ICD-10-CM | POA: Diagnosis not present

## 2017-03-05 DIAGNOSIS — R300 Dysuria: Secondary | ICD-10-CM | POA: Diagnosis not present

## 2017-03-05 DIAGNOSIS — N2581 Secondary hyperparathyroidism of renal origin: Secondary | ICD-10-CM | POA: Diagnosis not present

## 2017-03-06 ENCOUNTER — Other Ambulatory Visit: Payer: Self-pay | Admitting: *Deleted

## 2017-03-06 ENCOUNTER — Encounter: Payer: Self-pay | Admitting: *Deleted

## 2017-03-06 NOTE — Patient Outreach (Signed)
[facility]) Care Management   03/06/2017  [name] 03-16-1991 [id number]  [name] is an 26 y.o. female   ROS  Physical Exam  Encounter Medications:   Outpatient Encounter Prescriptions as of 03/06/2017  Medication Sig  . cinacalcet (SENSIPAR) 30 MG tablet Take 30 mg by mouth every other day.   Marland Kitchen doxycycline (VIBRAMYCIN) 100 MG capsule Take 1 capsule (100 mg total) by mouth 2 (two) times daily. One po bid x 7 days  . Epoetin Alfa (EPOGEN IJ) Inject as directed. Pt gets on dialysis days which are Tuesday, Thursday, and Saturday.  Marland Kitchen HYDROcodone-acetaminophen (NORCO/VICODIN) 5-325 MG tablet Take 1 tablet by mouth every 6 (six) hours as needed.  . linaclotide (LINZESS) 72 MCG capsule Take 1 capsule (72 mcg total) by mouth daily before breakfast.  . multivitamin (RENA-VIT) TABS tablet Take 1 tablet by mouth every Tuesday, Thursday, and Saturday at 6 PM.  . omeprazole (PRILOSEC) 20 MG capsule Take 20 mg by mouth daily.  . ondansetron (ZOFRAN) 4 MG tablet Take 1 tablet (4 mg total) by mouth every 8 (eight) hours as needed for nausea or vomiting.  . pantoprazole (PROTONIX) 40 MG tablet Take 1 tablet (40 mg total) by mouth 2 (two) times daily before a meal.  . sevelamer carbonate (RENVELA) 800 MG tablet Take 800 mg by mouth 3 (three) times daily with meals.    No facility-administered encounter medications on file as of 03/06/2017.     Functional Status:   In your present state of health, do you have any difficulty performing the following activities: 02/27/2017  Hearing? N  Vision? N  Difficulty concentrating or making decisions? N  Walking or climbing stairs? Y  Dressing or bathing? N  Doing errands, shopping? Y  Some recent data might be hidden    Fall/Depression Screening:    Fall Risk  03/06/2017 02/27/2017  Falls in the past year? Yes Yes  Number falls in past yr: 1 1  Injury with Fall? - Yes  Comment - bruise  right shoulder  Follow up Falls  prevention discussed -   PHQ 2/9 Scores 03/06/2017 02/27/2017  PHQ - 2 Score 0 0    Assessment:   CSW received referral from [place], Sherrin Daisy on 03/02/17 for community resources - support groups, patient requesting information on places to live. CSW called & spoke with patient to complete initial assessments - patient informed CSW that she goes to dialysis at [facility] in [place] on Tuesday, Thursday, Saturday schedule and transports via [facility] without difficulty. CSW spoke with patient about her diagnosis of Spina Bifida and support groups available but patient states that she is not interested.   Patient states that she has met with CSW at Dialysis, [name] but she is unable to assist patient with housing options. CSW completed financial assessment, patient reports that she receives $700/month and feels that she would be able to afford $400/month in rent. CSW printed information from social serve website and will mail to patient along with [facility] welcome packet. Patient states that she will keep an eye out for it and CSW will follow-up in 2 weeks to ensure that she has received it and will assist as needed.    Plan:   [facility] CM Care Plan Problem One     Most Recent Value  Care Plan Problem One  Patient requesting information on places to live  Role Documenting the Problem One  Clinical Social Worker  Care Plan for  Problem One  Active  [facility] Long Term Goal   Patient will be connected with housing resources  [facility] Long Term Goal Start Date  03/06/17  Interventions for Problem One Long Term Goal  CSW will mail information on housing and options 8/13.        Raynaldo Opitz, LCSW [facility]  Clinical Social Worker cell #: [phone]

## 2017-03-07 DIAGNOSIS — R300 Dysuria: Secondary | ICD-10-CM | POA: Diagnosis not present

## 2017-03-07 DIAGNOSIS — N2581 Secondary hyperparathyroidism of renal origin: Secondary | ICD-10-CM | POA: Diagnosis not present

## 2017-03-07 DIAGNOSIS — N186 End stage renal disease: Secondary | ICD-10-CM | POA: Diagnosis not present

## 2017-03-07 DIAGNOSIS — D631 Anemia in chronic kidney disease: Secondary | ICD-10-CM | POA: Diagnosis not present

## 2017-03-09 ENCOUNTER — Ambulatory Visit: Payer: Medicare Other | Admitting: Nurse Practitioner

## 2017-03-10 DIAGNOSIS — D631 Anemia in chronic kidney disease: Secondary | ICD-10-CM | POA: Diagnosis not present

## 2017-03-10 DIAGNOSIS — R300 Dysuria: Secondary | ICD-10-CM | POA: Diagnosis not present

## 2017-03-10 DIAGNOSIS — N2581 Secondary hyperparathyroidism of renal origin: Secondary | ICD-10-CM | POA: Diagnosis not present

## 2017-03-10 DIAGNOSIS — N186 End stage renal disease: Secondary | ICD-10-CM | POA: Diagnosis not present

## 2017-03-12 DIAGNOSIS — D631 Anemia in chronic kidney disease: Secondary | ICD-10-CM | POA: Diagnosis not present

## 2017-03-12 DIAGNOSIS — N2581 Secondary hyperparathyroidism of renal origin: Secondary | ICD-10-CM | POA: Diagnosis not present

## 2017-03-12 DIAGNOSIS — N186 End stage renal disease: Secondary | ICD-10-CM | POA: Diagnosis not present

## 2017-03-12 DIAGNOSIS — R300 Dysuria: Secondary | ICD-10-CM | POA: Diagnosis not present

## 2017-03-13 NOTE — Progress Notes (Signed)
REVIEWED-NO ADDITIONAL RECOMMENDATIONS. 

## 2017-03-14 DIAGNOSIS — L0390 Cellulitis, unspecified: Secondary | ICD-10-CM | POA: Diagnosis not present

## 2017-03-14 DIAGNOSIS — N186 End stage renal disease: Secondary | ICD-10-CM | POA: Diagnosis not present

## 2017-03-14 DIAGNOSIS — R300 Dysuria: Secondary | ICD-10-CM | POA: Diagnosis not present

## 2017-03-14 DIAGNOSIS — N2581 Secondary hyperparathyroidism of renal origin: Secondary | ICD-10-CM | POA: Diagnosis not present

## 2017-03-14 DIAGNOSIS — D631 Anemia in chronic kidney disease: Secondary | ICD-10-CM | POA: Diagnosis not present

## 2017-03-17 DIAGNOSIS — D631 Anemia in chronic kidney disease: Secondary | ICD-10-CM | POA: Diagnosis not present

## 2017-03-17 DIAGNOSIS — R300 Dysuria: Secondary | ICD-10-CM | POA: Diagnosis not present

## 2017-03-17 DIAGNOSIS — N2581 Secondary hyperparathyroidism of renal origin: Secondary | ICD-10-CM | POA: Diagnosis not present

## 2017-03-17 DIAGNOSIS — N186 End stage renal disease: Secondary | ICD-10-CM | POA: Diagnosis not present

## 2017-03-19 DIAGNOSIS — D631 Anemia in chronic kidney disease: Secondary | ICD-10-CM | POA: Diagnosis not present

## 2017-03-19 DIAGNOSIS — N186 End stage renal disease: Secondary | ICD-10-CM | POA: Diagnosis not present

## 2017-03-19 DIAGNOSIS — R300 Dysuria: Secondary | ICD-10-CM | POA: Diagnosis not present

## 2017-03-19 DIAGNOSIS — N2581 Secondary hyperparathyroidism of renal origin: Secondary | ICD-10-CM | POA: Diagnosis not present

## 2017-03-20 ENCOUNTER — Ambulatory Visit: Payer: Self-pay | Admitting: *Deleted

## 2017-03-21 DIAGNOSIS — R300 Dysuria: Secondary | ICD-10-CM | POA: Diagnosis not present

## 2017-03-21 DIAGNOSIS — N2581 Secondary hyperparathyroidism of renal origin: Secondary | ICD-10-CM | POA: Diagnosis not present

## 2017-03-21 DIAGNOSIS — N186 End stage renal disease: Secondary | ICD-10-CM | POA: Diagnosis not present

## 2017-03-21 DIAGNOSIS — D631 Anemia in chronic kidney disease: Secondary | ICD-10-CM | POA: Diagnosis not present

## 2017-03-23 ENCOUNTER — Other Ambulatory Visit: Payer: Self-pay | Admitting: *Deleted

## 2017-03-23 NOTE — Patient Outreach (Signed)
[facility]) Care Management  03/23/2017  [name] 02-Mar-1991 [id number]   CSW called & spoke with patient to follow-up on initial assessment & to ensure that housing resources had been received. Patient states that she is appreciative of resources, but that she has decided to wait a year before moving to save up money. CSW encouraged patient to call around and tour places in the mean time as some places have wait lists.   CSW will perform a case closure on patient, as all goals of treatment have been met from social work standpoint and no additional social work needs have been identified at this time. CSW will fax an update to patient's Primary Care Physician, Dr. Erling Cruz to ensure that they are aware of CSW's involvement with patient's plan of care. CSW will submit a case closure request to Alycia Rossetti, Care Management Assistant with [facility] in the form of an inbasket message.    Raynaldo Opitz, LCSW [facility]  Clinical Social Worker cell #: [phone]

## 2017-03-24 DIAGNOSIS — N186 End stage renal disease: Secondary | ICD-10-CM | POA: Diagnosis not present

## 2017-03-24 DIAGNOSIS — R300 Dysuria: Secondary | ICD-10-CM | POA: Diagnosis not present

## 2017-03-24 DIAGNOSIS — N2581 Secondary hyperparathyroidism of renal origin: Secondary | ICD-10-CM | POA: Diagnosis not present

## 2017-03-24 DIAGNOSIS — D631 Anemia in chronic kidney disease: Secondary | ICD-10-CM | POA: Diagnosis not present

## 2017-03-26 DIAGNOSIS — D631 Anemia in chronic kidney disease: Secondary | ICD-10-CM | POA: Diagnosis not present

## 2017-03-26 DIAGNOSIS — N2581 Secondary hyperparathyroidism of renal origin: Secondary | ICD-10-CM | POA: Diagnosis not present

## 2017-03-26 DIAGNOSIS — R300 Dysuria: Secondary | ICD-10-CM | POA: Diagnosis not present

## 2017-03-26 DIAGNOSIS — N186 End stage renal disease: Secondary | ICD-10-CM | POA: Diagnosis not present

## 2017-03-27 DIAGNOSIS — Z992 Dependence on renal dialysis: Secondary | ICD-10-CM | POA: Diagnosis not present

## 2017-03-27 DIAGNOSIS — N186 End stage renal disease: Secondary | ICD-10-CM | POA: Diagnosis not present

## 2017-03-27 DIAGNOSIS — I120 Hypertensive chronic kidney disease with stage 5 chronic kidney disease or end stage renal disease: Secondary | ICD-10-CM | POA: Diagnosis not present

## 2017-03-28 DIAGNOSIS — N186 End stage renal disease: Secondary | ICD-10-CM | POA: Diagnosis not present

## 2017-03-28 DIAGNOSIS — N2581 Secondary hyperparathyroidism of renal origin: Secondary | ICD-10-CM | POA: Diagnosis not present

## 2017-03-28 DIAGNOSIS — D631 Anemia in chronic kidney disease: Secondary | ICD-10-CM | POA: Diagnosis not present

## 2017-03-31 DIAGNOSIS — N186 End stage renal disease: Secondary | ICD-10-CM | POA: Diagnosis not present

## 2017-03-31 DIAGNOSIS — D631 Anemia in chronic kidney disease: Secondary | ICD-10-CM | POA: Diagnosis not present

## 2017-03-31 DIAGNOSIS — N2581 Secondary hyperparathyroidism of renal origin: Secondary | ICD-10-CM | POA: Diagnosis not present

## 2017-04-02 DIAGNOSIS — N2581 Secondary hyperparathyroidism of renal origin: Secondary | ICD-10-CM | POA: Diagnosis not present

## 2017-04-02 DIAGNOSIS — D631 Anemia in chronic kidney disease: Secondary | ICD-10-CM | POA: Diagnosis not present

## 2017-04-02 DIAGNOSIS — N186 End stage renal disease: Secondary | ICD-10-CM | POA: Diagnosis not present

## 2017-04-03 ENCOUNTER — Emergency Department (HOSPITAL_COMMUNITY): Payer: Medicare Other

## 2017-04-03 ENCOUNTER — Encounter (HOSPITAL_COMMUNITY): Payer: Self-pay | Admitting: Emergency Medicine

## 2017-04-03 ENCOUNTER — Emergency Department (HOSPITAL_COMMUNITY)
Admission: EM | Admit: 2017-04-03 | Discharge: 2017-04-04 | Disposition: A | Discharge status: Home / Self Care | Payer: Medicare Other | Attending: Emergency Medicine | Admitting: Emergency Medicine

## 2017-04-03 DIAGNOSIS — Z91040 Latex allergy status: Secondary | ICD-10-CM | POA: Diagnosis not present

## 2017-04-03 DIAGNOSIS — I1311 Hypertensive heart and chronic kidney disease without heart failure, with stage 5 chronic kidney disease, or end stage renal disease: Secondary | ICD-10-CM | POA: Insufficient documentation

## 2017-04-03 DIAGNOSIS — Z79899 Other long term (current) drug therapy: Secondary | ICD-10-CM | POA: Insufficient documentation

## 2017-04-03 DIAGNOSIS — N390 Urinary tract infection, site not specified: Secondary | ICD-10-CM | POA: Diagnosis not present

## 2017-04-03 DIAGNOSIS — R0789 Other chest pain: Secondary | ICD-10-CM | POA: Insufficient documentation

## 2017-04-03 DIAGNOSIS — Z91010 Allergy to peanuts: Secondary | ICD-10-CM | POA: Diagnosis not present

## 2017-04-03 DIAGNOSIS — Z992 Dependence on renal dialysis: Secondary | ICD-10-CM | POA: Diagnosis not present

## 2017-04-03 DIAGNOSIS — F17210 Nicotine dependence, cigarettes, uncomplicated: Secondary | ICD-10-CM | POA: Insufficient documentation

## 2017-04-03 DIAGNOSIS — N186 End stage renal disease: Secondary | ICD-10-CM | POA: Insufficient documentation

## 2017-04-03 DIAGNOSIS — R079 Chest pain, unspecified: Secondary | ICD-10-CM | POA: Diagnosis not present

## 2017-04-03 DIAGNOSIS — R1031 Right lower quadrant pain: Secondary | ICD-10-CM | POA: Insufficient documentation

## 2017-04-03 LAB — CBC
HEMATOCRIT: 33.4 % — AB (ref 36.0–46.0)
Hemoglobin: 10.5 g/dL — ABNORMAL LOW (ref 12.0–15.0)
MCH: 27.9 pg (ref 26.0–34.0)
MCHC: 31.4 g/dL (ref 30.0–36.0)
MCV: 88.8 fL (ref 78.0–100.0)
Platelets: 227 10*3/uL (ref 150–400)
RBC: 3.76 MIL/uL — ABNORMAL LOW (ref 3.87–5.11)
RDW: 15.7 % — AB (ref 11.5–15.5)
WBC: 5.7 10*3/uL (ref 4.0–10.5)

## 2017-04-03 LAB — I-STAT BETA HCG BLOOD, ED (NOT ORDERABLE): I-stat hCG, quantitative: <5 m[IU]/mL (ref <5)

## 2017-04-03 NOTE — ED Triage Notes (Signed)
[facility] called out for left side chest pain, RLQ abdominal pain and headache all day.

## 2017-04-03 NOTE — ED Provider Notes (Signed)
[place] DEPT Provider Note   CSN: [id number] Arrival date & time: 04/03/17  2238     History   Chief Complaint Chief Complaint  Patient presents with  . Chest Pain    HPI [name] is a 26 y.o. female.  Patient with history of ESRD on dialysis, spina bifida, wheelchair bound presenting with episode of left-sided chest pain that has since resolved. She reports pain in the left side of her chest that came on tonight while she was at rest. It radiated into her left arm and causes "stiffness" in her left arm. The pain resolved about 30 minutes after taking aspirin. It was associated with some shortness of breath. She's had nausea all day but no vomiting. Patient reports she woke with a headache this morning associated with nausea and right-sided lower abdominal pain. She's not had an appetite all day. She is trying to eat when the chest pain came on. Denies any cough or fever. Denies any missed dialysis sessions. She's never had this chest pain before. States her bowel movements have been regular and she is not constipated. She still makes urine and has to self catheterize. She reports constant right-sided lower abdominal pain associated with nausea and poor appetite. The chest pain continues to come and go and is associated with shortness of breath.   The history is provided by the patient and the EMS personnel.  Chest Pain   Associated symptoms include abdominal pain, headaches, nausea and shortness of breath. Pertinent negatives include no dizziness, no fever, no numbness and no vomiting.    Past Medical History:  Diagnosis Date  . Anemia associated with chronic renal failure   . Blood transfusion   . 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  . Depression 05/14/2015  . Dialysis care   . ESRD (end stage renal disease) on dialysis ([place])   . GERD (gastroesophageal reflux disease) 01/07/2017  . HTN (hypertension) 05/02/2011  . Spina bifida   . UTI (lower  urinary tract infection)     Patient Active Problem List   Diagnosis Date Noted  . GERD (gastroesophageal reflux disease) 01/07/2017  . Nausea with vomiting 01/07/2017  . Pyelonephritis 07/31/2015  . Back pain 07/31/2015  . Abdominal pain 07/31/2015  . Constipation 07/31/2015  . Major depressive disorder, single episode, severe without psychotic features ([place]) 05/15/2015  . Spina bifida ([place]) 01/18/2012  . ESRD (end stage renal disease) on dialysis ([place]) 05/02/2011  . Anemia 05/02/2011  . HTN (hypertension) 05/02/2011    Past Surgical History:  Procedure Laterality Date  . AV FISTULA PLACEMENT     left arm    OB History    Gravida Para Term Preterm AB Living   0 0 0 0 0 0   SAB TAB Ectopic Multiple Live Births   0 0 0 0 0       Home Medications    Prior to Admission medications   Medication Sig Start Date End Date Taking? Authorizing Provider  cinacalcet (SENSIPAR) 30 MG tablet Take 30 mg by mouth every other day.     [provider]  doxycycline (VIBRAMYCIN) 100 MG capsule Take 1 capsule (100 mg total) by mouth 2 (two) times daily. One po bid x 7 days 02/01/17   Ripley Fraise, MD  Epoetin Alfa (EPOGEN IJ) Inject as directed. Pt gets on dialysis days which are Tuesday, Thursday, and Saturday.    [provider]  HYDROcodone-acetaminophen (NORCO/VICODIN) 5-325 MG tablet Take 1 tablet  by mouth every 6 (six) hours as needed. 02/01/17   Ripley Fraise, MD  linaclotide [facility]) 72 MCG capsule Take 1 capsule (72 mcg total) by mouth daily before breakfast. 01/07/17   Carlis Stable, NP  multivitamin (RENA-VIT) TABS tablet Take 1 tablet by mouth every Tuesday, Thursday, and Saturday at 6 PM.    [provider]  omeprazole (PRILOSEC) 20 MG capsule Take 20 mg by mouth daily. 03/26/16   [provider]  ondansetron (ZOFRAN) 4 MG tablet Take 1 tablet (4 mg total) by mouth every 8 (eight) hours as needed for nausea or vomiting. 01/07/17   Carlis Stable,  NP  pantoprazole (PROTONIX) 40 MG tablet Take 1 tablet (40 mg total) by mouth 2 (two) times daily before a meal. 01/07/17   Carlis Stable, NP  sevelamer carbonate (RENVELA) 800 MG tablet Take 800 mg by mouth 3 (three) times daily with meals.     [provider]    Family History Family History  Problem Relation Age of Onset  . Kidney cancer Other   . Hypertension Maternal Grandmother   . Arthritis Maternal Grandmother   . Breast cancer Maternal Aunt   . Colon cancer Neg Hx     Social History Social History  Substance Use Topics  . Smoking status: Current Every Day Smoker    Packs/day: 0.10    Types: Cigarettes  . Smokeless tobacco: Never Used     Comment: 2 cigs a day  . Alcohol use No     Allergies   Ciprofloxacin; Other; Peanut-containing drug products; Aleve [naproxen sodium]; Ceftriaxone; Coconut oil; Influenza vaccines; Tetanus toxoids; and Latex   Review of Systems Review of Systems  Constitutional: Positive for activity change and appetite change. Negative for fatigue and fever.  HENT: Negative for congestion and rhinorrhea.   Eyes: Positive for photophobia and visual disturbance.  Respiratory: Positive for chest tightness and shortness of breath.   Cardiovascular: Positive for chest pain.  Gastrointestinal: Positive for abdominal pain and nausea. Negative for constipation and vomiting.  Genitourinary: Negative for vaginal bleeding and vaginal discharge.  Musculoskeletal: Negative for arthralgias and myalgias.  Neurological: Positive for headaches. Negative for dizziness, syncope, light-headedness and numbness.   all other systems are negative except as noted in the HPI and PMH.     Physical Exam Updated Vital Signs BP 109/70   Pulse 71   Temp 98.9 F (37.2 C) (Oral)   Resp 15   Wt 22.7 kg (50 lb)   SpO2 98%   BMI 61.03 kg/m   Physical Exam  Constitutional: She is oriented to person, place, and time. She appears well-developed and  well-nourished. No distress.  Uncomfortable, tearful  HENT:  Head: Normocephalic and atraumatic.  Mouth/Throat: Oropharynx is clear and moist. No oropharyngeal exudate.  Eyes: Pupils are equal, round, and reactive to light. Conjunctivae and EOM are normal.  Neck: Normal range of motion. Neck supple.  No meningismus.  Cardiovascular: Normal rate, regular rhythm, normal heart sounds and intact distal pulses.   No murmur heard. Pulmonary/Chest: Effort normal and breath sounds normal. No respiratory distress. She exhibits tenderness.  TTP L chest wall  Abdominal: Soft. There is tenderness. There is no rebound and no guarding.  TTP RLQ with voluntary guarding  Genitourinary:  Genitourinary Comments: Chaperone present. Stool palpable distal to finger tip. Unable to reach any to disimpact.  Musculoskeletal: Normal range of motion. She exhibits no edema or tenderness.  Atrophied and contracted lower extremities  Neurological: She  is alert and oriented to person, place, and time. No cranial nerve deficit. She exhibits normal muscle tone. Coordination normal.  . CN 2-12 intact.Equal grip strength. Upper extremity strength equal. Weakness in legs at baseline  Skin: Skin is warm. Capillary refill takes less than 2 seconds.  Psychiatric: She has a normal mood and affect. Her behavior is normal.  Nursing note and vitals reviewed.    ED Treatments / Results  Labs (all labs ordered are listed, but only abnormal results are displayed) Labs Reviewed  BASIC METABOLIC PANEL - Abnormal; Notable for the following:       Result Value   Potassium 3.3 (*)    Glucose, Bld 122 (*)    BUN 28 (*)    Creatinine, Ser 5.02 (*)    GFR calc non Af Amer 11 (*)    GFR calc Af Amer 13 (*)    All other components within normal limits  CBC - Abnormal; Notable for the following:    RBC 3.76 (*)    Hemoglobin 10.5 (*)    HCT 33.4 (*)    RDW 15.7 (*)    All other components within normal limits  HEPATIC FUNCTION  PANEL - Abnormal; Notable for the following:    ALT 10 (*)    Bilirubin, Direct <0.1 (*)    All other components within normal limits  URINALYSIS, ROUTINE W REFLEX MICROSCOPIC - Abnormal; Notable for the following:    Color, Urine STRAW (*)    APPearance HAZY (*)    Glucose, UA 150 (*)    Protein, ur >=300 (*)    Leukocytes, UA LARGE (*)    Bacteria, UA RARE (*)    Squamous Epithelial / LPF 0-5 (*)    All other components within normal limits  URINE CULTURE  CULTURE, BLOOD (ROUTINE X 2)  TROPONIN I  LIPASE, BLOOD  D-DIMER, QUANTITATIVE (NOT AT [facility])  TROPONIN I  I-STAT BETA HCG BLOOD, ED ([facility], [facility], [facility] ONLY)  I-STAT BETA HCG BLOOD, ED (NOT ORDERABLE)  I-STAT CG4 LACTIC ACID, ED  CG4 I-STAT (LACTIC ACID)    EKG  EKG Interpretation  Date/Time:  Friday April 03 2017 22:49:57 EDT Ventricular Rate:  81 PR Interval:  120 QRS Duration: 80 QT Interval:  376 QTC Calculation: 436 R Axis:   21 Text Interpretation:  Normal sinus rhythm Normal ECG No significant change was found Confirmed by Ezequiel Essex [phone]) on 04/03/2017 11:06:21 PM       Radiology Dg Chest 2 View  Result Date: 04/03/2017 CLINICAL DATA:  Chest pain EXAM: CHEST  2 VIEW COMPARISON:  11/05/2016, 07/23/2016 FINDINGS: Low lung volumes. No acute consolidation or pleural effusion. Stable cardiomediastinal silhouette. No pneumothorax. Thoracic cage dysplasia unchanged. IMPRESSION: No active cardiopulmonary disease. Electronically Signed   By: Donavan Foil M.D.   On: 04/03/2017 23:23   Ct Abdomen Pelvis W Contrast  Result Date: 04/04/2017 CLINICAL DATA:  Right lower quadrant abdominal pain. Headache. Left-sided chest pain. EXAM: CT ABDOMEN AND PELVIS WITH CONTRAST TECHNIQUE: Multidetector CT imaging of the abdomen and pelvis was performed using the standard protocol following bolus administration of intravenous contrast. CONTRAST:  70mL ISOVUE-300 IOPAMIDOL (ISOVUE-300) INJECTION 61% COMPARISON:  CT 09/15/2016  FINDINGS: Patient's anatomy is distorted due to spina bifida and caudal regression. Lower chest: Right lower lobe scarring. No consolidation. Heart is normal in size. Hepatobiliary: Subcentimeter low-density lesion in the right hepatic dome, unchanged from prior exam. No new hepatic lesion. Gallbladder physiologically distended, no calcified stone. No biliary  dilatation. Pancreas: Grossly normal, pancreas is not well-defined given paucity of intra-abdominal fat and distorted intra-abdominal anatomy. Spleen: Normal in size without focal abnormality. Adrenals/Urinary Tract: Adrenal glands are not well visualized. 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 Dilated left ureter is unchanged. Thick-walled urinary bladder displaced anteriorly. Multiple bladder diverticular, which are not well-defined given adjacent fluid-filled bowel loops. Stomach/Bowel: Bowel anatomy is suboptimally defined given lack of enteric contrast, paucity of body fat and distorted anatomy. Stomach is physiologically distended. No small bowel dilatation or inflammation. Appendix tentatively identified normal in caliber with high-density material likely from prior enteric contrast. Large colonic stool burden with chronic fecal impaction. Slight decreased rectal stool burden from prior. Vascular/Lymphatic: No acute abnormality. Small caliber vessels without acute vascular abnormality. No bulky adenopathy. Reproductive: Left adnexal lesion measuring 5.9 x 5.9 cm containing fat, soft tissue density in bone consistent with left ovarian dermoid is similar to prior exam. The uterus is not well-defined. Other: No free air or ascites. Musculoskeletal: Stable from prior exam. Sequela of caudal regression with absence of the sacrum, coccyx, as well as lumbar spine. Distorted pelvic bony morphology is stable. IMPRESSION: 1. No acute abnormality. Appendix tentatively identified and filled with enteric contrast  from remote prior CT. 2. Chronic large stool burden and rectal distention concerning for chronic fecal impaction. 3. 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 Slight decrease in hydronephrosis from prior exam. 4. 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 5. Sequela of caudal regression with chronic pelvic deformity. Electronically Signed   By: Jeb Levering M.D.   On: 04/04/2017 01:39    Procedures Procedures (including critical care time)  Medications Ordered in ED Medications - No data to display   Initial Impression / Assessment and Plan / ED Course  I have reviewed the triage vital signs and the nursing notes.  Pertinent labs & imaging results that were available during my care of the patient were reviewed by me and considered in my medical decision making (see chart for details).     Patient with headache, nausea and right lower quadrant pain ongoing all day. Developed chest pain tonight was trying to eat. This resolved after about 30 minutes. EKG shows normal sinus rhythm  Troponin and d-dimer negative. Chest x-ray is negative. With ongoing right-sided lower abdominal pain we'll obtain CT. Low suspicion for pulmonary embolism or dissection  CT shows normal appendix, chronic constipation.  Patient with multiple antibiotic allergies. She has tolerated fosfomycin in the past multiple times. Cultures have grown pseudomonas in the past.  Patient does not want to be admitted. She has dialysis later this morning. She wants to go home. She does not appear to be toxic or septic. Lactate normal. Blood and urine cultures pending. Troponin negative x2. Low suspicion for ACS. Fosfomycin given. Patient tolerating PO. Continue bowel regimen and followup with PCP. Return precautions discussed. Final Clinical Impressions(s) / ED Diagnoses   Final diagnoses:  Atypical chest pain  Urinary tract infection without  hematuria, site unspecified    New Prescriptions New Prescriptions   No medications on file     Ezequiel Essex, MD 04/05/17 2128

## 2017-04-03 NOTE — ED Notes (Signed)
Pts last dialysis was yesterday.

## 2017-04-04 ENCOUNTER — Emergency Department (HOSPITAL_COMMUNITY): Payer: Medicare Other

## 2017-04-04 ENCOUNTER — Encounter (HOSPITAL_COMMUNITY): Payer: Self-pay

## 2017-04-04 DIAGNOSIS — Z743 Need for continuous supervision: Secondary | ICD-10-CM | POA: Diagnosis not present

## 2017-04-04 DIAGNOSIS — D631 Anemia in chronic kidney disease: Secondary | ICD-10-CM | POA: Diagnosis not present

## 2017-04-04 DIAGNOSIS — R279 Unspecified lack of coordination: Secondary | ICD-10-CM | POA: Diagnosis not present

## 2017-04-04 DIAGNOSIS — N186 End stage renal disease: Secondary | ICD-10-CM | POA: Diagnosis not present

## 2017-04-04 DIAGNOSIS — R0789 Other chest pain: Secondary | ICD-10-CM | POA: Diagnosis not present

## 2017-04-04 DIAGNOSIS — R1031 Right lower quadrant pain: Secondary | ICD-10-CM | POA: Diagnosis not present

## 2017-04-04 DIAGNOSIS — N2581 Secondary hyperparathyroidism of renal origin: Secondary | ICD-10-CM | POA: Diagnosis not present

## 2017-04-04 LAB — HEPATIC FUNCTION PANEL
ALBUMIN: 3.8 g/dL (ref 3.5–5.0)
ALK PHOS: 110 U/L (ref 38–126)
ALT: 10 U/L — ABNORMAL LOW (ref 14–54)
AST: 17 U/L (ref 15–41)
BILIRUBIN TOTAL: 0.4 mg/dL (ref 0.3–1.2)
Bilirubin, Direct: <0.1 mg/dL — ABNORMAL LOW (ref 0.1–0.5)
Total Protein: 7.3 g/dL (ref 6.5–8.1)

## 2017-04-04 LAB — LIPASE, BLOOD: LIPASE: 48 U/L (ref 11–51)

## 2017-04-04 LAB — URINALYSIS, ROUTINE W REFLEX MICROSCOPIC
Bilirubin Urine: NEGATIVE
GLUCOSE, UA: 150 mg/dL — AB
Hgb urine dipstick: NEGATIVE
Ketones, ur: NEGATIVE mg/dL
Nitrite: NEGATIVE
SPECIFIC GRAVITY, URINE: 1.007 (ref 1.005–1.030)
pH: 8 (ref 5.0–8.0)

## 2017-04-04 LAB — CG4 I-STAT (LACTIC ACID): LACTIC ACID, VENOUS: 1.13 mmol/L (ref 0.5–1.9)

## 2017-04-04 LAB — BASIC METABOLIC PANEL
Anion gap: 12 (ref 5–15)
BUN: 28 mg/dL — AB (ref 6–20)
CALCIUM: 9 mg/dL (ref 8.9–10.3)
CO2: 23 mmol/L (ref 22–32)
CREATININE: 5.02 mg/dL — AB (ref 0.44–1.00)
Chloride: 105 mmol/L (ref 101–111)
GFR calc Af Amer: 13 mL/min — ABNORMAL LOW (ref >60)
GFR calc non Af Amer: 11 mL/min — ABNORMAL LOW (ref >60)
Glucose, Bld: 122 mg/dL — ABNORMAL HIGH (ref 65–99)
POTASSIUM: 3.3 mmol/L — AB (ref 3.5–5.1)
SODIUM: 140 mmol/L (ref 135–145)

## 2017-04-04 LAB — TROPONIN I
Troponin I: <0.03 ng/mL (ref <0.03)
Troponin I: <0.03 ng/mL (ref <0.03)

## 2017-04-04 LAB — D-DIMER, QUANTITATIVE: D-Dimer, Quant: <0.27 ug/mL-FEU (ref 0.00–0.50)

## 2017-04-04 MED ORDER — FOSFOMYCIN TROMETHAMINE 3 G PO PACK
3.0000 g | PACK | Freq: Once | ORAL | Status: AC
  Administered 2017-04-04: 3 g via ORAL
  Filled 2017-04-04 (×2): qty 3

## 2017-04-04 MED ORDER — IOPAMIDOL (ISOVUE-300) INJECTION 61%
50.0000 mL | Freq: Once | INTRAVENOUS | Status: AC | PRN
  Administered 2017-04-04: 50 mL via INTRAVENOUS

## 2017-04-04 MED ORDER — IOPAMIDOL (ISOVUE-300) INJECTION 61%
INTRAVENOUS | Status: AC
  Filled 2017-04-04: qty 50

## 2017-04-04 MED ORDER — DEXTROSE 5 % IV SOLN
1.0000 g | Freq: Once | INTRAVENOUS | Status: DC
Start: 2017-04-04 — End: 2017-04-04
  Filled 2017-04-04: qty 10

## 2017-04-04 NOTE — Discharge Instructions (Signed)
There is no evidence of heart attack or blood clot in the lung. You were treated with antibiotics in the ED. You will be called with the results of your urine culture. Return to the ED if you develop new or worsening symptoms.

## 2017-04-04 NOTE — ED Notes (Signed)
Assisted patient with self catheterization, patient tolerated well.

## 2017-04-05 LAB — URINE CULTURE: Culture: NO GROWTH

## 2017-04-07 DIAGNOSIS — D631 Anemia in chronic kidney disease: Secondary | ICD-10-CM | POA: Diagnosis not present

## 2017-04-07 DIAGNOSIS — N186 End stage renal disease: Secondary | ICD-10-CM | POA: Diagnosis not present

## 2017-04-07 DIAGNOSIS — N2581 Secondary hyperparathyroidism of renal origin: Secondary | ICD-10-CM | POA: Diagnosis not present

## 2017-04-09 DIAGNOSIS — N186 End stage renal disease: Secondary | ICD-10-CM | POA: Diagnosis not present

## 2017-04-09 DIAGNOSIS — D631 Anemia in chronic kidney disease: Secondary | ICD-10-CM | POA: Diagnosis not present

## 2017-04-09 DIAGNOSIS — N2581 Secondary hyperparathyroidism of renal origin: Secondary | ICD-10-CM | POA: Diagnosis not present

## 2017-04-09 LAB — CULTURE, BLOOD (ROUTINE X 2)
Culture: NO GROWTH
SPECIAL REQUESTS: ADEQUATE

## 2017-04-11 DIAGNOSIS — N186 End stage renal disease: Secondary | ICD-10-CM | POA: Diagnosis not present

## 2017-04-11 DIAGNOSIS — D631 Anemia in chronic kidney disease: Secondary | ICD-10-CM | POA: Diagnosis not present

## 2017-04-11 DIAGNOSIS — N2581 Secondary hyperparathyroidism of renal origin: Secondary | ICD-10-CM | POA: Diagnosis not present

## 2017-04-14 DIAGNOSIS — D631 Anemia in chronic kidney disease: Secondary | ICD-10-CM | POA: Diagnosis not present

## 2017-04-14 DIAGNOSIS — N186 End stage renal disease: Secondary | ICD-10-CM | POA: Diagnosis not present

## 2017-04-14 DIAGNOSIS — N2581 Secondary hyperparathyroidism of renal origin: Secondary | ICD-10-CM | POA: Diagnosis not present

## 2017-04-15 DIAGNOSIS — N186 End stage renal disease: Secondary | ICD-10-CM | POA: Diagnosis not present

## 2017-04-15 DIAGNOSIS — N2581 Secondary hyperparathyroidism of renal origin: Secondary | ICD-10-CM | POA: Diagnosis not present

## 2017-04-15 DIAGNOSIS — D631 Anemia in chronic kidney disease: Secondary | ICD-10-CM | POA: Diagnosis not present

## 2017-04-18 DIAGNOSIS — N2581 Secondary hyperparathyroidism of renal origin: Secondary | ICD-10-CM | POA: Diagnosis not present

## 2017-04-18 DIAGNOSIS — N186 End stage renal disease: Secondary | ICD-10-CM | POA: Diagnosis not present

## 2017-04-18 DIAGNOSIS — D631 Anemia in chronic kidney disease: Secondary | ICD-10-CM | POA: Diagnosis not present

## 2017-04-21 DIAGNOSIS — D631 Anemia in chronic kidney disease: Secondary | ICD-10-CM | POA: Diagnosis not present

## 2017-04-21 DIAGNOSIS — N186 End stage renal disease: Secondary | ICD-10-CM | POA: Diagnosis not present

## 2017-04-21 DIAGNOSIS — N2581 Secondary hyperparathyroidism of renal origin: Secondary | ICD-10-CM | POA: Diagnosis not present

## 2017-04-23 DIAGNOSIS — N2581 Secondary hyperparathyroidism of renal origin: Secondary | ICD-10-CM | POA: Diagnosis not present

## 2017-04-23 DIAGNOSIS — D631 Anemia in chronic kidney disease: Secondary | ICD-10-CM | POA: Diagnosis not present

## 2017-04-23 DIAGNOSIS — N186 End stage renal disease: Secondary | ICD-10-CM | POA: Diagnosis not present

## 2017-04-25 DIAGNOSIS — N186 End stage renal disease: Secondary | ICD-10-CM | POA: Diagnosis not present

## 2017-04-25 DIAGNOSIS — D631 Anemia in chronic kidney disease: Secondary | ICD-10-CM | POA: Diagnosis not present

## 2017-04-25 DIAGNOSIS — N2581 Secondary hyperparathyroidism of renal origin: Secondary | ICD-10-CM | POA: Diagnosis not present

## 2017-04-26 DIAGNOSIS — N186 End stage renal disease: Secondary | ICD-10-CM | POA: Diagnosis not present

## 2017-04-26 DIAGNOSIS — Z992 Dependence on renal dialysis: Secondary | ICD-10-CM | POA: Diagnosis not present

## 2017-04-26 DIAGNOSIS — I120 Hypertensive chronic kidney disease with stage 5 chronic kidney disease or end stage renal disease: Secondary | ICD-10-CM | POA: Diagnosis not present

## 2017-04-27 DIAGNOSIS — R45851 Suicidal ideations: Secondary | ICD-10-CM | POA: Diagnosis not present

## 2017-04-29 ENCOUNTER — Ambulatory Visit: Payer: Medicare Other | Admitting: Nurse Practitioner

## 2017-04-29 ENCOUNTER — Encounter: Payer: Self-pay | Admitting: Nurse Practitioner

## 2017-04-29 ENCOUNTER — Telehealth: Payer: Self-pay | Admitting: Nurse Practitioner

## 2017-04-29 NOTE — Telephone Encounter (Signed)
PATIENT WAS A NO SHOW AND LETTER SENT  °

## 2017-04-29 NOTE — Progress Notes (Deleted)
Referring Provider: Estanislado Emms, MD Primary Care Physician:  Lucianne Lei, MD Primary GI:  Dr. Oneida Alar  No chief complaint on file.   HPI:   [name] is a 26 y.o. female who presents for follow-up. The patient was last seen in our office 01/07/2017 for constipation, GERD, non-intractable vomiting with nausea. History of bowel impaction requiring ER visit and disimpaction. History of end-stage renal disease with anemia. At her last visit she stated nausea and vomiting have been going on for a while but improved with GERD medication but worsened again about one month prior. Frequently vomits at dialysis, persistent nausea. Bowel movement every day to every 3 days with hard stools that require straining. Also having intermittent chest pain and referral was supposed to be underway by dialysis PA. Recommended stop Prilosec, start Protonix twice daily, Zofran as needed for nausea, Linzess 72 g pills for trial with request a progress report. Return for follow-up in 2 months.  It does not appear progress report was called.  Today she states   Past Medical History:  Diagnosis Date  . Anemia associated with chronic renal failure   . Blood transfusion   . 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  . Depression 05/14/2015  . Dialysis care   . ESRD (end stage renal disease) on dialysis ([place])   . GERD (gastroesophageal reflux disease) 01/07/2017  . HTN (hypertension) 05/02/2011  . Spina bifida   . UTI (lower urinary tract infection)     Past Surgical History:  Procedure Laterality Date  . AV FISTULA PLACEMENT     left arm    Current Outpatient Prescriptions  Medication Sig Dispense Refill  . cinacalcet (SENSIPAR) 30 MG tablet Take 30 mg by mouth every other day.     Marland Kitchen doxycycline (VIBRAMYCIN) 100 MG capsule Take 1 capsule (100 mg total) by mouth 2 (two) times daily. One po bid x 7 days 14 capsule 0  . Epoetin Alfa (EPOGEN IJ) Inject as directed. Pt gets on  dialysis days which are Tuesday, Thursday, and Saturday.    Marland Kitchen HYDROcodone-acetaminophen (NORCO/VICODIN) 5-325 MG tablet Take 1 tablet by mouth every 6 (six) hours as needed. 5 tablet 0  . linaclotide (LINZESS) 72 MCG capsule Take 1 capsule (72 mcg total) by mouth daily before breakfast. 16 capsule 0  . multivitamin (RENA-VIT) TABS tablet Take 1 tablet by mouth every Tuesday, Thursday, and Saturday at 6 PM.    . omeprazole (PRILOSEC) 20 MG capsule Take 20 mg by mouth daily.  3  . ondansetron (ZOFRAN) 4 MG tablet Take 1 tablet (4 mg total) by mouth every 8 (eight) hours as needed for nausea or vomiting. 20 tablet 2  . pantoprazole (PROTONIX) 40 MG tablet Take 1 tablet (40 mg total) by mouth 2 (two) times daily before a meal. 60 tablet 3  . sevelamer carbonate (RENVELA) 800 MG tablet Take 800 mg by mouth 3 (three) times daily with meals.      No current facility-administered medications for this visit.     Allergies as of 04/29/2017 - Review Complete 04/04/2017  Allergen Reaction Noted  . Ciprofloxacin Shortness Of Breath, Nausea And Vomiting, and Other (See Comments) 05/02/2011  . Other Anaphylaxis 05/02/2011  . Peanut-containing drug products Anaphylaxis 11/21/2011  . Aleve [naproxen sodium] Other (See Comments) 03/29/2012  . Ceftriaxone Other (See Comments) 09/15/2016  . Coconut oil Hives 12/05/2016  . Influenza vaccines Nausea And Vomiting 11/21/2011  . Tetanus toxoids Nausea And  Vomiting 05/02/2011  . Latex Itching and Rash 05/02/2011    Family History  Problem Relation Age of Onset  . Kidney cancer Other   . Hypertension Maternal Grandmother   . Arthritis Maternal Grandmother   . Breast cancer Maternal Aunt   . Colon cancer Neg Hx     Social History   Social History  . Marital status: Single    Spouse name: N/A  . Number of children: N/A  . Years of education: N/A   Social History Main Topics  . Smoking status: Current Every Day Smoker    Packs/day: 0.10    Types:  Cigarettes  . Smokeless tobacco: Never Used     Comment: 2 cigs a day  . Alcohol use No  . Drug use: No  . Sexual activity: No   Other Topics Concern  . Not on file   Social History Narrative  . No narrative on file    Review of Systems: General: Negative for anorexia, weight loss, fever, chills, fatigue, weakness. Eyes: Negative for vision changes.  ENT: Negative for hoarseness, difficulty swallowing , nasal congestion. CV: Negative for chest pain, angina, palpitations, dyspnea on exertion, peripheral edema.  Respiratory: Negative for dyspnea at rest, dyspnea on exertion, cough, sputum, wheezing.  GI: See history of present illness. GU:  Negative for dysuria, hematuria, urinary incontinence, urinary frequency, nocturnal urination.  MS: Negative for joint pain, low back pain.  Derm: Negative for rash or itching.  Neuro: Negative for weakness, abnormal sensation, seizure, frequent headaches, memory loss, confusion.  Psych: Negative for anxiety, depression, suicidal ideation, hallucinations.  Endo: Negative for unusual weight change.  Heme: Negative for bruising or bleeding. Allergy: Negative for rash or hives.   Physical Exam: There were no vitals taken for this visit. General:   Alert and oriented. Pleasant and cooperative. Well-nourished and well-developed.  Head:  Normocephalic and atraumatic. Eyes:  Without icterus, sclera clear and conjunctiva pink.  Ears:  Normal auditory acuity. Mouth:  No deformity or lesions, oral mucosa pink.  Throat/Neck:  Supple, without mass or thyromegaly. Cardiovascular:  S1, S2 present without murmurs appreciated. Normal pulses noted. Extremities without clubbing or edema. Respiratory:  Clear to auscultation bilaterally. No wheezes, rales, or rhonchi. No distress.  Gastrointestinal:  +BS, soft, non-tender and non-distended. No HSM noted. No guarding or rebound. No masses appreciated.  Rectal:  Deferred  Musculoskalatal:  Symmetrical without  gross deformities. Normal posture. Skin:  Intact without significant lesions or rashes. Neurologic:  Alert and oriented x4;  grossly normal neurologically. Psych:  Alert and cooperative. Normal mood and affect. Heme/Lymph/Immune: No significant cervical adenopathy. No excessive bruising noted.    04/29/2017 10:13 AM   Disclaimer: This note was dictated with voice recognition software. Similar sounding words can inadvertently be transcribed and may not be corrected upon review.

## 2017-04-30 DIAGNOSIS — N2581 Secondary hyperparathyroidism of renal origin: Secondary | ICD-10-CM | POA: Diagnosis not present

## 2017-04-30 DIAGNOSIS — N186 End stage renal disease: Secondary | ICD-10-CM | POA: Diagnosis not present

## 2017-04-30 DIAGNOSIS — D631 Anemia in chronic kidney disease: Secondary | ICD-10-CM | POA: Diagnosis not present

## 2017-04-30 NOTE — Telephone Encounter (Signed)
Noted  

## 2017-05-01 DIAGNOSIS — Q181 Preauricular sinus and cyst: Secondary | ICD-10-CM | POA: Diagnosis not present

## 2017-05-02 DIAGNOSIS — N186 End stage renal disease: Secondary | ICD-10-CM | POA: Diagnosis not present

## 2017-05-02 DIAGNOSIS — D631 Anemia in chronic kidney disease: Secondary | ICD-10-CM | POA: Diagnosis not present

## 2017-05-02 DIAGNOSIS — N2581 Secondary hyperparathyroidism of renal origin: Secondary | ICD-10-CM | POA: Diagnosis not present

## 2017-05-07 DIAGNOSIS — N186 End stage renal disease: Secondary | ICD-10-CM | POA: Diagnosis not present

## 2017-05-07 DIAGNOSIS — N2581 Secondary hyperparathyroidism of renal origin: Secondary | ICD-10-CM | POA: Diagnosis not present

## 2017-05-07 DIAGNOSIS — D631 Anemia in chronic kidney disease: Secondary | ICD-10-CM | POA: Diagnosis not present

## 2017-05-09 DIAGNOSIS — N2581 Secondary hyperparathyroidism of renal origin: Secondary | ICD-10-CM | POA: Diagnosis not present

## 2017-05-09 DIAGNOSIS — N186 End stage renal disease: Secondary | ICD-10-CM | POA: Diagnosis not present

## 2017-05-09 DIAGNOSIS — D631 Anemia in chronic kidney disease: Secondary | ICD-10-CM | POA: Diagnosis not present

## 2017-05-12 DIAGNOSIS — N186 End stage renal disease: Secondary | ICD-10-CM | POA: Diagnosis not present

## 2017-05-12 DIAGNOSIS — D631 Anemia in chronic kidney disease: Secondary | ICD-10-CM | POA: Diagnosis not present

## 2017-05-12 DIAGNOSIS — N2581 Secondary hyperparathyroidism of renal origin: Secondary | ICD-10-CM | POA: Diagnosis not present

## 2017-05-14 DIAGNOSIS — D631 Anemia in chronic kidney disease: Secondary | ICD-10-CM | POA: Diagnosis not present

## 2017-05-14 DIAGNOSIS — N186 End stage renal disease: Secondary | ICD-10-CM | POA: Diagnosis not present

## 2017-05-14 DIAGNOSIS — N2581 Secondary hyperparathyroidism of renal origin: Secondary | ICD-10-CM | POA: Diagnosis not present

## 2017-05-16 DIAGNOSIS — D631 Anemia in chronic kidney disease: Secondary | ICD-10-CM | POA: Diagnosis not present

## 2017-05-16 DIAGNOSIS — N186 End stage renal disease: Secondary | ICD-10-CM | POA: Diagnosis not present

## 2017-05-16 DIAGNOSIS — N2581 Secondary hyperparathyroidism of renal origin: Secondary | ICD-10-CM | POA: Diagnosis not present

## 2017-05-20 DIAGNOSIS — N2581 Secondary hyperparathyroidism of renal origin: Secondary | ICD-10-CM | POA: Diagnosis not present

## 2017-05-20 DIAGNOSIS — N186 End stage renal disease: Secondary | ICD-10-CM | POA: Diagnosis not present

## 2017-05-20 DIAGNOSIS — D631 Anemia in chronic kidney disease: Secondary | ICD-10-CM | POA: Diagnosis not present

## 2017-05-21 DIAGNOSIS — D631 Anemia in chronic kidney disease: Secondary | ICD-10-CM | POA: Diagnosis not present

## 2017-05-21 DIAGNOSIS — N186 End stage renal disease: Secondary | ICD-10-CM | POA: Diagnosis not present

## 2017-05-21 DIAGNOSIS — N2581 Secondary hyperparathyroidism of renal origin: Secondary | ICD-10-CM | POA: Diagnosis not present

## 2017-05-23 DIAGNOSIS — D631 Anemia in chronic kidney disease: Secondary | ICD-10-CM | POA: Diagnosis not present

## 2017-05-23 DIAGNOSIS — N186 End stage renal disease: Secondary | ICD-10-CM | POA: Diagnosis not present

## 2017-05-23 DIAGNOSIS — N2581 Secondary hyperparathyroidism of renal origin: Secondary | ICD-10-CM | POA: Diagnosis not present

## 2017-05-23 DIAGNOSIS — R300 Dysuria: Secondary | ICD-10-CM | POA: Diagnosis not present

## 2017-05-26 DIAGNOSIS — N186 End stage renal disease: Secondary | ICD-10-CM | POA: Diagnosis not present

## 2017-05-26 DIAGNOSIS — N2581 Secondary hyperparathyroidism of renal origin: Secondary | ICD-10-CM | POA: Diagnosis not present

## 2017-05-26 DIAGNOSIS — D631 Anemia in chronic kidney disease: Secondary | ICD-10-CM | POA: Diagnosis not present

## 2017-05-27 DIAGNOSIS — N186 End stage renal disease: Secondary | ICD-10-CM | POA: Diagnosis not present

## 2017-05-27 DIAGNOSIS — I120 Hypertensive chronic kidney disease with stage 5 chronic kidney disease or end stage renal disease: Secondary | ICD-10-CM | POA: Diagnosis not present

## 2017-05-27 DIAGNOSIS — Z992 Dependence on renal dialysis: Secondary | ICD-10-CM | POA: Diagnosis not present

## 2017-05-28 DIAGNOSIS — D631 Anemia in chronic kidney disease: Secondary | ICD-10-CM | POA: Diagnosis not present

## 2017-05-28 DIAGNOSIS — N186 End stage renal disease: Secondary | ICD-10-CM | POA: Diagnosis not present

## 2017-05-28 DIAGNOSIS — N2581 Secondary hyperparathyroidism of renal origin: Secondary | ICD-10-CM | POA: Diagnosis not present

## 2017-05-28 DIAGNOSIS — B965 Pseudomonas (aeruginosa) (mallei) (pseudomallei) as the cause of diseases classified elsewhere: Secondary | ICD-10-CM | POA: Diagnosis not present

## 2017-05-30 DIAGNOSIS — B965 Pseudomonas (aeruginosa) (mallei) (pseudomallei) as the cause of diseases classified elsewhere: Secondary | ICD-10-CM | POA: Diagnosis not present

## 2017-05-30 DIAGNOSIS — D631 Anemia in chronic kidney disease: Secondary | ICD-10-CM | POA: Diagnosis not present

## 2017-05-30 DIAGNOSIS — N186 End stage renal disease: Secondary | ICD-10-CM | POA: Diagnosis not present

## 2017-05-30 DIAGNOSIS — N2581 Secondary hyperparathyroidism of renal origin: Secondary | ICD-10-CM | POA: Diagnosis not present

## 2017-06-01 DIAGNOSIS — N186 End stage renal disease: Secondary | ICD-10-CM | POA: Diagnosis not present

## 2017-06-01 DIAGNOSIS — B965 Pseudomonas (aeruginosa) (mallei) (pseudomallei) as the cause of diseases classified elsewhere: Secondary | ICD-10-CM | POA: Diagnosis not present

## 2017-06-01 DIAGNOSIS — D631 Anemia in chronic kidney disease: Secondary | ICD-10-CM | POA: Diagnosis not present

## 2017-06-01 DIAGNOSIS — N2581 Secondary hyperparathyroidism of renal origin: Secondary | ICD-10-CM | POA: Diagnosis not present

## 2017-06-04 DIAGNOSIS — D631 Anemia in chronic kidney disease: Secondary | ICD-10-CM | POA: Diagnosis not present

## 2017-06-04 DIAGNOSIS — N186 End stage renal disease: Secondary | ICD-10-CM | POA: Diagnosis not present

## 2017-06-04 DIAGNOSIS — N2581 Secondary hyperparathyroidism of renal origin: Secondary | ICD-10-CM | POA: Diagnosis not present

## 2017-06-04 DIAGNOSIS — B965 Pseudomonas (aeruginosa) (mallei) (pseudomallei) as the cause of diseases classified elsewhere: Secondary | ICD-10-CM | POA: Diagnosis not present

## 2017-06-06 DIAGNOSIS — D631 Anemia in chronic kidney disease: Secondary | ICD-10-CM | POA: Diagnosis not present

## 2017-06-06 DIAGNOSIS — B965 Pseudomonas (aeruginosa) (mallei) (pseudomallei) as the cause of diseases classified elsewhere: Secondary | ICD-10-CM | POA: Diagnosis not present

## 2017-06-06 DIAGNOSIS — N186 End stage renal disease: Secondary | ICD-10-CM | POA: Diagnosis not present

## 2017-06-06 DIAGNOSIS — N2581 Secondary hyperparathyroidism of renal origin: Secondary | ICD-10-CM | POA: Diagnosis not present

## 2017-06-09 DIAGNOSIS — N186 End stage renal disease: Secondary | ICD-10-CM | POA: Diagnosis not present

## 2017-06-09 DIAGNOSIS — B965 Pseudomonas (aeruginosa) (mallei) (pseudomallei) as the cause of diseases classified elsewhere: Secondary | ICD-10-CM | POA: Diagnosis not present

## 2017-06-09 DIAGNOSIS — D631 Anemia in chronic kidney disease: Secondary | ICD-10-CM | POA: Diagnosis not present

## 2017-06-09 DIAGNOSIS — N2581 Secondary hyperparathyroidism of renal origin: Secondary | ICD-10-CM | POA: Diagnosis not present

## 2017-06-11 DIAGNOSIS — B965 Pseudomonas (aeruginosa) (mallei) (pseudomallei) as the cause of diseases classified elsewhere: Secondary | ICD-10-CM | POA: Diagnosis not present

## 2017-06-11 DIAGNOSIS — N2581 Secondary hyperparathyroidism of renal origin: Secondary | ICD-10-CM | POA: Diagnosis not present

## 2017-06-11 DIAGNOSIS — N186 End stage renal disease: Secondary | ICD-10-CM | POA: Diagnosis not present

## 2017-06-11 DIAGNOSIS — D631 Anemia in chronic kidney disease: Secondary | ICD-10-CM | POA: Diagnosis not present

## 2017-06-13 DIAGNOSIS — N2581 Secondary hyperparathyroidism of renal origin: Secondary | ICD-10-CM | POA: Diagnosis not present

## 2017-06-13 DIAGNOSIS — D631 Anemia in chronic kidney disease: Secondary | ICD-10-CM | POA: Diagnosis not present

## 2017-06-13 DIAGNOSIS — N186 End stage renal disease: Secondary | ICD-10-CM | POA: Diagnosis not present

## 2017-06-13 DIAGNOSIS — B965 Pseudomonas (aeruginosa) (mallei) (pseudomallei) as the cause of diseases classified elsewhere: Secondary | ICD-10-CM | POA: Diagnosis not present

## 2017-06-15 DIAGNOSIS — D631 Anemia in chronic kidney disease: Secondary | ICD-10-CM | POA: Diagnosis not present

## 2017-06-15 DIAGNOSIS — B965 Pseudomonas (aeruginosa) (mallei) (pseudomallei) as the cause of diseases classified elsewhere: Secondary | ICD-10-CM | POA: Diagnosis not present

## 2017-06-15 DIAGNOSIS — N186 End stage renal disease: Secondary | ICD-10-CM | POA: Diagnosis not present

## 2017-06-15 DIAGNOSIS — N2581 Secondary hyperparathyroidism of renal origin: Secondary | ICD-10-CM | POA: Diagnosis not present

## 2017-06-17 DIAGNOSIS — N186 End stage renal disease: Secondary | ICD-10-CM | POA: Diagnosis not present

## 2017-06-17 DIAGNOSIS — D631 Anemia in chronic kidney disease: Secondary | ICD-10-CM | POA: Diagnosis not present

## 2017-06-17 DIAGNOSIS — B965 Pseudomonas (aeruginosa) (mallei) (pseudomallei) as the cause of diseases classified elsewhere: Secondary | ICD-10-CM | POA: Diagnosis not present

## 2017-06-17 DIAGNOSIS — N2581 Secondary hyperparathyroidism of renal origin: Secondary | ICD-10-CM | POA: Diagnosis not present

## 2017-06-17 DIAGNOSIS — R300 Dysuria: Secondary | ICD-10-CM | POA: Diagnosis not present

## 2017-06-20 DIAGNOSIS — N186 End stage renal disease: Secondary | ICD-10-CM | POA: Diagnosis not present

## 2017-06-20 DIAGNOSIS — D631 Anemia in chronic kidney disease: Secondary | ICD-10-CM | POA: Diagnosis not present

## 2017-06-20 DIAGNOSIS — B965 Pseudomonas (aeruginosa) (mallei) (pseudomallei) as the cause of diseases classified elsewhere: Secondary | ICD-10-CM | POA: Diagnosis not present

## 2017-06-20 DIAGNOSIS — N2581 Secondary hyperparathyroidism of renal origin: Secondary | ICD-10-CM | POA: Diagnosis not present

## 2017-06-23 DIAGNOSIS — N2581 Secondary hyperparathyroidism of renal origin: Secondary | ICD-10-CM | POA: Diagnosis not present

## 2017-06-23 DIAGNOSIS — B965 Pseudomonas (aeruginosa) (mallei) (pseudomallei) as the cause of diseases classified elsewhere: Secondary | ICD-10-CM | POA: Diagnosis not present

## 2017-06-23 DIAGNOSIS — D631 Anemia in chronic kidney disease: Secondary | ICD-10-CM | POA: Diagnosis not present

## 2017-06-23 DIAGNOSIS — N186 End stage renal disease: Secondary | ICD-10-CM | POA: Diagnosis not present

## 2017-06-25 DIAGNOSIS — N186 End stage renal disease: Secondary | ICD-10-CM | POA: Diagnosis not present

## 2017-06-25 DIAGNOSIS — B965 Pseudomonas (aeruginosa) (mallei) (pseudomallei) as the cause of diseases classified elsewhere: Secondary | ICD-10-CM | POA: Diagnosis not present

## 2017-06-25 DIAGNOSIS — N2581 Secondary hyperparathyroidism of renal origin: Secondary | ICD-10-CM | POA: Diagnosis not present

## 2017-06-25 DIAGNOSIS — D631 Anemia in chronic kidney disease: Secondary | ICD-10-CM | POA: Diagnosis not present

## 2017-06-26 DIAGNOSIS — N186 End stage renal disease: Secondary | ICD-10-CM | POA: Diagnosis not present

## 2017-06-26 DIAGNOSIS — Z992 Dependence on renal dialysis: Secondary | ICD-10-CM | POA: Diagnosis not present

## 2017-06-26 DIAGNOSIS — I120 Hypertensive chronic kidney disease with stage 5 chronic kidney disease or end stage renal disease: Secondary | ICD-10-CM | POA: Diagnosis not present

## 2017-06-27 DIAGNOSIS — N2581 Secondary hyperparathyroidism of renal origin: Secondary | ICD-10-CM | POA: Diagnosis not present

## 2017-06-27 DIAGNOSIS — D631 Anemia in chronic kidney disease: Secondary | ICD-10-CM | POA: Diagnosis not present

## 2017-06-27 DIAGNOSIS — N186 End stage renal disease: Secondary | ICD-10-CM | POA: Diagnosis not present

## 2017-06-30 DIAGNOSIS — N2581 Secondary hyperparathyroidism of renal origin: Secondary | ICD-10-CM | POA: Diagnosis not present

## 2017-06-30 DIAGNOSIS — N186 End stage renal disease: Secondary | ICD-10-CM | POA: Diagnosis not present

## 2017-06-30 DIAGNOSIS — D631 Anemia in chronic kidney disease: Secondary | ICD-10-CM | POA: Diagnosis not present

## 2017-07-02 DIAGNOSIS — N2581 Secondary hyperparathyroidism of renal origin: Secondary | ICD-10-CM | POA: Diagnosis not present

## 2017-07-02 DIAGNOSIS — N186 End stage renal disease: Secondary | ICD-10-CM | POA: Diagnosis not present

## 2017-07-02 DIAGNOSIS — D631 Anemia in chronic kidney disease: Secondary | ICD-10-CM | POA: Diagnosis not present

## 2017-07-03 DIAGNOSIS — R51 Headache: Secondary | ICD-10-CM | POA: Diagnosis not present

## 2017-07-04 DIAGNOSIS — D631 Anemia in chronic kidney disease: Secondary | ICD-10-CM | POA: Diagnosis not present

## 2017-07-04 DIAGNOSIS — N2581 Secondary hyperparathyroidism of renal origin: Secondary | ICD-10-CM | POA: Diagnosis not present

## 2017-07-04 DIAGNOSIS — N186 End stage renal disease: Secondary | ICD-10-CM | POA: Diagnosis not present

## 2017-07-08 DIAGNOSIS — N186 End stage renal disease: Secondary | ICD-10-CM | POA: Diagnosis not present

## 2017-07-08 DIAGNOSIS — D631 Anemia in chronic kidney disease: Secondary | ICD-10-CM | POA: Diagnosis not present

## 2017-07-08 DIAGNOSIS — N2581 Secondary hyperparathyroidism of renal origin: Secondary | ICD-10-CM | POA: Diagnosis not present

## 2017-07-09 DIAGNOSIS — N186 End stage renal disease: Secondary | ICD-10-CM | POA: Diagnosis not present

## 2017-07-09 DIAGNOSIS — N2581 Secondary hyperparathyroidism of renal origin: Secondary | ICD-10-CM | POA: Diagnosis not present

## 2017-07-09 DIAGNOSIS — D631 Anemia in chronic kidney disease: Secondary | ICD-10-CM | POA: Diagnosis not present

## 2017-07-14 DIAGNOSIS — N2581 Secondary hyperparathyroidism of renal origin: Secondary | ICD-10-CM | POA: Diagnosis not present

## 2017-07-14 DIAGNOSIS — D631 Anemia in chronic kidney disease: Secondary | ICD-10-CM | POA: Diagnosis not present

## 2017-07-14 DIAGNOSIS — N186 End stage renal disease: Secondary | ICD-10-CM | POA: Diagnosis not present

## 2017-07-16 DIAGNOSIS — N2581 Secondary hyperparathyroidism of renal origin: Secondary | ICD-10-CM | POA: Diagnosis not present

## 2017-07-16 DIAGNOSIS — D631 Anemia in chronic kidney disease: Secondary | ICD-10-CM | POA: Diagnosis not present

## 2017-07-16 DIAGNOSIS — N186 End stage renal disease: Secondary | ICD-10-CM | POA: Diagnosis not present

## 2017-07-18 DIAGNOSIS — D631 Anemia in chronic kidney disease: Secondary | ICD-10-CM | POA: Diagnosis not present

## 2017-07-18 DIAGNOSIS — N186 End stage renal disease: Secondary | ICD-10-CM | POA: Diagnosis not present

## 2017-07-18 DIAGNOSIS — N2581 Secondary hyperparathyroidism of renal origin: Secondary | ICD-10-CM | POA: Diagnosis not present

## 2017-07-20 DIAGNOSIS — D631 Anemia in chronic kidney disease: Secondary | ICD-10-CM | POA: Diagnosis not present

## 2017-07-20 DIAGNOSIS — N2581 Secondary hyperparathyroidism of renal origin: Secondary | ICD-10-CM | POA: Diagnosis not present

## 2017-07-20 DIAGNOSIS — N186 End stage renal disease: Secondary | ICD-10-CM | POA: Diagnosis not present

## 2017-07-23 DIAGNOSIS — D631 Anemia in chronic kidney disease: Secondary | ICD-10-CM | POA: Diagnosis not present

## 2017-07-23 DIAGNOSIS — N2581 Secondary hyperparathyroidism of renal origin: Secondary | ICD-10-CM | POA: Diagnosis not present

## 2017-07-23 DIAGNOSIS — N186 End stage renal disease: Secondary | ICD-10-CM | POA: Diagnosis not present

## 2017-07-25 DIAGNOSIS — N2581 Secondary hyperparathyroidism of renal origin: Secondary | ICD-10-CM | POA: Diagnosis not present

## 2017-07-25 DIAGNOSIS — N186 End stage renal disease: Secondary | ICD-10-CM | POA: Diagnosis not present

## 2017-07-25 DIAGNOSIS — D631 Anemia in chronic kidney disease: Secondary | ICD-10-CM | POA: Diagnosis not present

## 2017-07-27 DIAGNOSIS — I120 Hypertensive chronic kidney disease with stage 5 chronic kidney disease or end stage renal disease: Secondary | ICD-10-CM | POA: Diagnosis not present

## 2017-07-27 DIAGNOSIS — D631 Anemia in chronic kidney disease: Secondary | ICD-10-CM | POA: Diagnosis not present

## 2017-07-27 DIAGNOSIS — N2581 Secondary hyperparathyroidism of renal origin: Secondary | ICD-10-CM | POA: Diagnosis not present

## 2017-07-27 DIAGNOSIS — N186 End stage renal disease: Secondary | ICD-10-CM | POA: Diagnosis not present

## 2017-07-27 DIAGNOSIS — R300 Dysuria: Secondary | ICD-10-CM | POA: Diagnosis not present

## 2017-07-27 DIAGNOSIS — B999 Unspecified infectious disease: Secondary | ICD-10-CM | POA: Diagnosis not present

## 2017-07-27 DIAGNOSIS — Z992 Dependence on renal dialysis: Secondary | ICD-10-CM | POA: Diagnosis not present

## 2017-07-30 DIAGNOSIS — N186 End stage renal disease: Secondary | ICD-10-CM | POA: Diagnosis not present

## 2017-07-30 DIAGNOSIS — N2581 Secondary hyperparathyroidism of renal origin: Secondary | ICD-10-CM | POA: Diagnosis not present

## 2017-08-01 DIAGNOSIS — N2581 Secondary hyperparathyroidism of renal origin: Secondary | ICD-10-CM | POA: Diagnosis not present

## 2017-08-01 DIAGNOSIS — N186 End stage renal disease: Secondary | ICD-10-CM | POA: Diagnosis not present

## 2017-08-04 DIAGNOSIS — N186 End stage renal disease: Secondary | ICD-10-CM | POA: Diagnosis not present

## 2017-08-04 DIAGNOSIS — N2581 Secondary hyperparathyroidism of renal origin: Secondary | ICD-10-CM | POA: Diagnosis not present

## 2017-08-06 DIAGNOSIS — N2581 Secondary hyperparathyroidism of renal origin: Secondary | ICD-10-CM | POA: Diagnosis not present

## 2017-08-06 DIAGNOSIS — N186 End stage renal disease: Secondary | ICD-10-CM | POA: Diagnosis not present

## 2017-08-08 DIAGNOSIS — N186 End stage renal disease: Secondary | ICD-10-CM | POA: Diagnosis not present

## 2017-08-08 DIAGNOSIS — N2581 Secondary hyperparathyroidism of renal origin: Secondary | ICD-10-CM | POA: Diagnosis not present

## 2017-08-11 DIAGNOSIS — N186 End stage renal disease: Secondary | ICD-10-CM | POA: Diagnosis not present

## 2017-08-11 DIAGNOSIS — N2581 Secondary hyperparathyroidism of renal origin: Secondary | ICD-10-CM | POA: Diagnosis not present

## 2017-08-13 DIAGNOSIS — N2581 Secondary hyperparathyroidism of renal origin: Secondary | ICD-10-CM | POA: Diagnosis not present

## 2017-08-13 DIAGNOSIS — N186 End stage renal disease: Secondary | ICD-10-CM | POA: Diagnosis not present

## 2017-08-14 DIAGNOSIS — M25531 Pain in right wrist: Secondary | ICD-10-CM | POA: Diagnosis not present

## 2017-08-15 DIAGNOSIS — N2581 Secondary hyperparathyroidism of renal origin: Secondary | ICD-10-CM | POA: Diagnosis not present

## 2017-08-15 DIAGNOSIS — N186 End stage renal disease: Secondary | ICD-10-CM | POA: Diagnosis not present

## 2017-08-18 DIAGNOSIS — N186 End stage renal disease: Secondary | ICD-10-CM | POA: Diagnosis not present

## 2017-08-18 DIAGNOSIS — N2581 Secondary hyperparathyroidism of renal origin: Secondary | ICD-10-CM | POA: Diagnosis not present

## 2017-08-20 DIAGNOSIS — N186 End stage renal disease: Secondary | ICD-10-CM | POA: Diagnosis not present

## 2017-08-20 DIAGNOSIS — N2581 Secondary hyperparathyroidism of renal origin: Secondary | ICD-10-CM | POA: Diagnosis not present

## 2017-08-22 DIAGNOSIS — N2581 Secondary hyperparathyroidism of renal origin: Secondary | ICD-10-CM | POA: Diagnosis not present

## 2017-08-22 DIAGNOSIS — N186 End stage renal disease: Secondary | ICD-10-CM | POA: Diagnosis not present

## 2017-08-25 DIAGNOSIS — N186 End stage renal disease: Secondary | ICD-10-CM | POA: Diagnosis not present

## 2017-08-25 DIAGNOSIS — N2581 Secondary hyperparathyroidism of renal origin: Secondary | ICD-10-CM | POA: Diagnosis not present

## 2017-08-27 DIAGNOSIS — N2581 Secondary hyperparathyroidism of renal origin: Secondary | ICD-10-CM | POA: Diagnosis not present

## 2017-08-27 DIAGNOSIS — I120 Hypertensive chronic kidney disease with stage 5 chronic kidney disease or end stage renal disease: Secondary | ICD-10-CM | POA: Diagnosis not present

## 2017-08-27 DIAGNOSIS — N186 End stage renal disease: Secondary | ICD-10-CM | POA: Diagnosis not present

## 2017-08-27 DIAGNOSIS — Z992 Dependence on renal dialysis: Secondary | ICD-10-CM | POA: Diagnosis not present

## 2017-08-28 DIAGNOSIS — J029 Acute pharyngitis, unspecified: Secondary | ICD-10-CM | POA: Diagnosis not present

## 2017-08-28 DIAGNOSIS — J399 Disease of upper respiratory tract, unspecified: Secondary | ICD-10-CM | POA: Diagnosis not present

## 2017-08-28 DIAGNOSIS — F3289 Other specified depressive episodes: Secondary | ICD-10-CM | POA: Diagnosis not present

## 2017-08-28 DIAGNOSIS — I120 Hypertensive chronic kidney disease with stage 5 chronic kidney disease or end stage renal disease: Secondary | ICD-10-CM | POA: Diagnosis not present

## 2017-08-28 DIAGNOSIS — Z992 Dependence on renal dialysis: Secondary | ICD-10-CM | POA: Diagnosis not present

## 2017-08-28 DIAGNOSIS — N186 End stage renal disease: Secondary | ICD-10-CM | POA: Diagnosis not present

## 2017-08-29 DIAGNOSIS — N186 End stage renal disease: Secondary | ICD-10-CM | POA: Diagnosis not present

## 2017-08-29 DIAGNOSIS — D631 Anemia in chronic kidney disease: Secondary | ICD-10-CM | POA: Diagnosis not present

## 2017-08-29 DIAGNOSIS — N2581 Secondary hyperparathyroidism of renal origin: Secondary | ICD-10-CM | POA: Diagnosis not present

## 2017-09-01 DIAGNOSIS — D631 Anemia in chronic kidney disease: Secondary | ICD-10-CM | POA: Diagnosis not present

## 2017-09-01 DIAGNOSIS — N2581 Secondary hyperparathyroidism of renal origin: Secondary | ICD-10-CM | POA: Diagnosis not present

## 2017-09-01 DIAGNOSIS — N186 End stage renal disease: Secondary | ICD-10-CM | POA: Diagnosis not present

## 2017-09-03 DIAGNOSIS — N2581 Secondary hyperparathyroidism of renal origin: Secondary | ICD-10-CM | POA: Diagnosis not present

## 2017-09-03 DIAGNOSIS — D631 Anemia in chronic kidney disease: Secondary | ICD-10-CM | POA: Diagnosis not present

## 2017-09-03 DIAGNOSIS — N186 End stage renal disease: Secondary | ICD-10-CM | POA: Diagnosis not present

## 2017-09-08 DIAGNOSIS — N2581 Secondary hyperparathyroidism of renal origin: Secondary | ICD-10-CM | POA: Diagnosis not present

## 2017-09-08 DIAGNOSIS — N186 End stage renal disease: Secondary | ICD-10-CM | POA: Diagnosis not present

## 2017-09-08 DIAGNOSIS — D631 Anemia in chronic kidney disease: Secondary | ICD-10-CM | POA: Diagnosis not present

## 2017-09-10 DIAGNOSIS — N186 End stage renal disease: Secondary | ICD-10-CM | POA: Diagnosis not present

## 2017-09-10 DIAGNOSIS — D631 Anemia in chronic kidney disease: Secondary | ICD-10-CM | POA: Diagnosis not present

## 2017-09-10 DIAGNOSIS — N2581 Secondary hyperparathyroidism of renal origin: Secondary | ICD-10-CM | POA: Diagnosis not present

## 2017-09-15 DIAGNOSIS — N186 End stage renal disease: Secondary | ICD-10-CM | POA: Diagnosis not present

## 2017-09-15 DIAGNOSIS — D631 Anemia in chronic kidney disease: Secondary | ICD-10-CM | POA: Diagnosis not present

## 2017-09-15 DIAGNOSIS — N2581 Secondary hyperparathyroidism of renal origin: Secondary | ICD-10-CM | POA: Diagnosis not present

## 2017-09-17 DIAGNOSIS — N186 End stage renal disease: Secondary | ICD-10-CM | POA: Diagnosis not present

## 2017-09-17 DIAGNOSIS — N2581 Secondary hyperparathyroidism of renal origin: Secondary | ICD-10-CM | POA: Diagnosis not present

## 2017-09-17 DIAGNOSIS — D631 Anemia in chronic kidney disease: Secondary | ICD-10-CM | POA: Diagnosis not present

## 2017-09-19 DIAGNOSIS — N2581 Secondary hyperparathyroidism of renal origin: Secondary | ICD-10-CM | POA: Diagnosis not present

## 2017-09-19 DIAGNOSIS — N186 End stage renal disease: Secondary | ICD-10-CM | POA: Diagnosis not present

## 2017-09-19 DIAGNOSIS — D631 Anemia in chronic kidney disease: Secondary | ICD-10-CM | POA: Diagnosis not present

## 2017-09-22 DIAGNOSIS — N186 End stage renal disease: Secondary | ICD-10-CM | POA: Diagnosis not present

## 2017-09-22 DIAGNOSIS — D631 Anemia in chronic kidney disease: Secondary | ICD-10-CM | POA: Diagnosis not present

## 2017-09-22 DIAGNOSIS — N2581 Secondary hyperparathyroidism of renal origin: Secondary | ICD-10-CM | POA: Diagnosis not present

## 2017-09-24 DIAGNOSIS — N2581 Secondary hyperparathyroidism of renal origin: Secondary | ICD-10-CM | POA: Diagnosis not present

## 2017-09-24 DIAGNOSIS — N186 End stage renal disease: Secondary | ICD-10-CM | POA: Diagnosis not present

## 2017-09-24 DIAGNOSIS — D631 Anemia in chronic kidney disease: Secondary | ICD-10-CM | POA: Diagnosis not present

## 2017-09-25 DIAGNOSIS — N186 End stage renal disease: Secondary | ICD-10-CM | POA: Diagnosis not present

## 2017-09-25 DIAGNOSIS — I120 Hypertensive chronic kidney disease with stage 5 chronic kidney disease or end stage renal disease: Secondary | ICD-10-CM | POA: Diagnosis not present

## 2017-09-25 DIAGNOSIS — Z992 Dependence on renal dialysis: Secondary | ICD-10-CM | POA: Diagnosis not present

## 2017-09-26 DIAGNOSIS — D631 Anemia in chronic kidney disease: Secondary | ICD-10-CM | POA: Diagnosis not present

## 2017-09-26 DIAGNOSIS — N2581 Secondary hyperparathyroidism of renal origin: Secondary | ICD-10-CM | POA: Diagnosis not present

## 2017-09-26 DIAGNOSIS — N186 End stage renal disease: Secondary | ICD-10-CM | POA: Diagnosis not present

## 2017-09-29 DIAGNOSIS — N2581 Secondary hyperparathyroidism of renal origin: Secondary | ICD-10-CM | POA: Diagnosis not present

## 2017-09-29 DIAGNOSIS — N186 End stage renal disease: Secondary | ICD-10-CM | POA: Diagnosis not present

## 2017-09-29 DIAGNOSIS — D631 Anemia in chronic kidney disease: Secondary | ICD-10-CM | POA: Diagnosis not present

## 2017-10-01 DIAGNOSIS — N186 End stage renal disease: Secondary | ICD-10-CM | POA: Diagnosis not present

## 2017-10-01 DIAGNOSIS — D631 Anemia in chronic kidney disease: Secondary | ICD-10-CM | POA: Diagnosis not present

## 2017-10-01 DIAGNOSIS — N2581 Secondary hyperparathyroidism of renal origin: Secondary | ICD-10-CM | POA: Diagnosis not present

## 2017-10-02 DIAGNOSIS — D631 Anemia in chronic kidney disease: Secondary | ICD-10-CM | POA: Diagnosis not present

## 2017-10-02 DIAGNOSIS — N2581 Secondary hyperparathyroidism of renal origin: Secondary | ICD-10-CM | POA: Diagnosis not present

## 2017-10-02 DIAGNOSIS — N186 End stage renal disease: Secondary | ICD-10-CM | POA: Diagnosis not present

## 2017-10-06 DIAGNOSIS — D631 Anemia in chronic kidney disease: Secondary | ICD-10-CM | POA: Diagnosis not present

## 2017-10-06 DIAGNOSIS — N186 End stage renal disease: Secondary | ICD-10-CM | POA: Diagnosis not present

## 2017-10-06 DIAGNOSIS — N2581 Secondary hyperparathyroidism of renal origin: Secondary | ICD-10-CM | POA: Diagnosis not present

## 2017-10-08 DIAGNOSIS — N2581 Secondary hyperparathyroidism of renal origin: Secondary | ICD-10-CM | POA: Diagnosis not present

## 2017-10-08 DIAGNOSIS — D631 Anemia in chronic kidney disease: Secondary | ICD-10-CM | POA: Diagnosis not present

## 2017-10-08 DIAGNOSIS — N186 End stage renal disease: Secondary | ICD-10-CM | POA: Diagnosis not present

## 2017-10-10 DIAGNOSIS — D631 Anemia in chronic kidney disease: Secondary | ICD-10-CM | POA: Diagnosis not present

## 2017-10-10 DIAGNOSIS — N186 End stage renal disease: Secondary | ICD-10-CM | POA: Diagnosis not present

## 2017-10-10 DIAGNOSIS — N2581 Secondary hyperparathyroidism of renal origin: Secondary | ICD-10-CM | POA: Diagnosis not present

## 2017-10-13 DIAGNOSIS — D631 Anemia in chronic kidney disease: Secondary | ICD-10-CM | POA: Diagnosis not present

## 2017-10-13 DIAGNOSIS — N2581 Secondary hyperparathyroidism of renal origin: Secondary | ICD-10-CM | POA: Diagnosis not present

## 2017-10-13 DIAGNOSIS — N186 End stage renal disease: Secondary | ICD-10-CM | POA: Diagnosis not present

## 2017-10-15 DIAGNOSIS — N186 End stage renal disease: Secondary | ICD-10-CM | POA: Diagnosis not present

## 2017-10-15 DIAGNOSIS — N2581 Secondary hyperparathyroidism of renal origin: Secondary | ICD-10-CM | POA: Diagnosis not present

## 2017-10-15 DIAGNOSIS — R300 Dysuria: Secondary | ICD-10-CM | POA: Diagnosis not present

## 2017-10-15 DIAGNOSIS — D631 Anemia in chronic kidney disease: Secondary | ICD-10-CM | POA: Diagnosis not present

## 2017-10-17 DIAGNOSIS — N2581 Secondary hyperparathyroidism of renal origin: Secondary | ICD-10-CM | POA: Diagnosis not present

## 2017-10-17 DIAGNOSIS — N186 End stage renal disease: Secondary | ICD-10-CM | POA: Diagnosis not present

## 2017-10-17 DIAGNOSIS — D631 Anemia in chronic kidney disease: Secondary | ICD-10-CM | POA: Diagnosis not present

## 2017-10-23 DIAGNOSIS — N2581 Secondary hyperparathyroidism of renal origin: Secondary | ICD-10-CM | POA: Diagnosis not present

## 2017-10-23 DIAGNOSIS — N186 End stage renal disease: Secondary | ICD-10-CM | POA: Diagnosis not present

## 2017-10-26 DIAGNOSIS — R52 Pain, unspecified: Secondary | ICD-10-CM | POA: Diagnosis not present

## 2017-10-26 DIAGNOSIS — N390 Urinary tract infection, site not specified: Secondary | ICD-10-CM | POA: Diagnosis not present

## 2017-10-26 DIAGNOSIS — I120 Hypertensive chronic kidney disease with stage 5 chronic kidney disease or end stage renal disease: Secondary | ICD-10-CM | POA: Diagnosis not present

## 2017-10-26 DIAGNOSIS — D631 Anemia in chronic kidney disease: Secondary | ICD-10-CM | POA: Diagnosis not present

## 2017-10-26 DIAGNOSIS — N186 End stage renal disease: Secondary | ICD-10-CM | POA: Diagnosis not present

## 2017-10-26 DIAGNOSIS — N2581 Secondary hyperparathyroidism of renal origin: Secondary | ICD-10-CM | POA: Diagnosis not present

## 2017-10-26 DIAGNOSIS — Z992 Dependence on renal dialysis: Secondary | ICD-10-CM | POA: Diagnosis not present

## 2017-10-28 DIAGNOSIS — N186 End stage renal disease: Secondary | ICD-10-CM | POA: Diagnosis not present

## 2017-10-28 DIAGNOSIS — N2581 Secondary hyperparathyroidism of renal origin: Secondary | ICD-10-CM | POA: Diagnosis not present

## 2017-10-28 DIAGNOSIS — N390 Urinary tract infection, site not specified: Secondary | ICD-10-CM | POA: Diagnosis not present

## 2017-10-28 DIAGNOSIS — R52 Pain, unspecified: Secondary | ICD-10-CM | POA: Diagnosis not present

## 2017-10-28 DIAGNOSIS — D631 Anemia in chronic kidney disease: Secondary | ICD-10-CM | POA: Diagnosis not present

## 2017-10-30 DIAGNOSIS — N390 Urinary tract infection, site not specified: Secondary | ICD-10-CM | POA: Diagnosis not present

## 2017-10-30 DIAGNOSIS — N186 End stage renal disease: Secondary | ICD-10-CM | POA: Diagnosis not present

## 2017-10-30 DIAGNOSIS — R52 Pain, unspecified: Secondary | ICD-10-CM | POA: Diagnosis not present

## 2017-10-30 DIAGNOSIS — N2581 Secondary hyperparathyroidism of renal origin: Secondary | ICD-10-CM | POA: Diagnosis not present

## 2017-10-30 DIAGNOSIS — D631 Anemia in chronic kidney disease: Secondary | ICD-10-CM | POA: Diagnosis not present

## 2017-11-04 DIAGNOSIS — R52 Pain, unspecified: Secondary | ICD-10-CM | POA: Diagnosis not present

## 2017-11-04 DIAGNOSIS — N186 End stage renal disease: Secondary | ICD-10-CM | POA: Diagnosis not present

## 2017-11-04 DIAGNOSIS — D631 Anemia in chronic kidney disease: Secondary | ICD-10-CM | POA: Diagnosis not present

## 2017-11-04 DIAGNOSIS — N390 Urinary tract infection, site not specified: Secondary | ICD-10-CM | POA: Diagnosis not present

## 2017-11-04 DIAGNOSIS — N2581 Secondary hyperparathyroidism of renal origin: Secondary | ICD-10-CM | POA: Diagnosis not present

## 2017-11-09 DIAGNOSIS — N2581 Secondary hyperparathyroidism of renal origin: Secondary | ICD-10-CM | POA: Diagnosis not present

## 2017-11-09 DIAGNOSIS — D631 Anemia in chronic kidney disease: Secondary | ICD-10-CM | POA: Diagnosis not present

## 2017-11-09 DIAGNOSIS — N186 End stage renal disease: Secondary | ICD-10-CM | POA: Diagnosis not present

## 2017-11-09 DIAGNOSIS — N390 Urinary tract infection, site not specified: Secondary | ICD-10-CM | POA: Diagnosis not present

## 2017-11-09 DIAGNOSIS — R52 Pain, unspecified: Secondary | ICD-10-CM | POA: Diagnosis not present

## 2017-11-11 DIAGNOSIS — N2581 Secondary hyperparathyroidism of renal origin: Secondary | ICD-10-CM | POA: Diagnosis not present

## 2017-11-11 DIAGNOSIS — N390 Urinary tract infection, site not specified: Secondary | ICD-10-CM | POA: Diagnosis not present

## 2017-11-11 DIAGNOSIS — R52 Pain, unspecified: Secondary | ICD-10-CM | POA: Diagnosis not present

## 2017-11-11 DIAGNOSIS — D631 Anemia in chronic kidney disease: Secondary | ICD-10-CM | POA: Diagnosis not present

## 2017-11-11 DIAGNOSIS — N186 End stage renal disease: Secondary | ICD-10-CM | POA: Diagnosis not present

## 2017-11-16 DIAGNOSIS — N186 End stage renal disease: Secondary | ICD-10-CM | POA: Diagnosis not present

## 2017-11-16 DIAGNOSIS — N390 Urinary tract infection, site not specified: Secondary | ICD-10-CM | POA: Diagnosis not present

## 2017-11-16 DIAGNOSIS — N2581 Secondary hyperparathyroidism of renal origin: Secondary | ICD-10-CM | POA: Diagnosis not present

## 2017-11-16 DIAGNOSIS — D631 Anemia in chronic kidney disease: Secondary | ICD-10-CM | POA: Diagnosis not present

## 2017-11-16 DIAGNOSIS — R52 Pain, unspecified: Secondary | ICD-10-CM | POA: Diagnosis not present

## 2017-11-18 DIAGNOSIS — N390 Urinary tract infection, site not specified: Secondary | ICD-10-CM | POA: Diagnosis not present

## 2017-11-18 DIAGNOSIS — N186 End stage renal disease: Secondary | ICD-10-CM | POA: Diagnosis not present

## 2017-11-18 DIAGNOSIS — D631 Anemia in chronic kidney disease: Secondary | ICD-10-CM | POA: Diagnosis not present

## 2017-11-18 DIAGNOSIS — N2581 Secondary hyperparathyroidism of renal origin: Secondary | ICD-10-CM | POA: Diagnosis not present

## 2017-11-18 DIAGNOSIS — R52 Pain, unspecified: Secondary | ICD-10-CM | POA: Diagnosis not present

## 2017-11-20 DIAGNOSIS — N2581 Secondary hyperparathyroidism of renal origin: Secondary | ICD-10-CM | POA: Diagnosis not present

## 2017-11-20 DIAGNOSIS — N390 Urinary tract infection, site not specified: Secondary | ICD-10-CM | POA: Diagnosis not present

## 2017-11-20 DIAGNOSIS — N186 End stage renal disease: Secondary | ICD-10-CM | POA: Diagnosis not present

## 2017-11-20 DIAGNOSIS — D631 Anemia in chronic kidney disease: Secondary | ICD-10-CM | POA: Diagnosis not present

## 2017-11-20 DIAGNOSIS — R52 Pain, unspecified: Secondary | ICD-10-CM | POA: Diagnosis not present

## 2017-11-25 DIAGNOSIS — N2581 Secondary hyperparathyroidism of renal origin: Secondary | ICD-10-CM | POA: Diagnosis not present

## 2017-11-25 DIAGNOSIS — I120 Hypertensive chronic kidney disease with stage 5 chronic kidney disease or end stage renal disease: Secondary | ICD-10-CM | POA: Diagnosis not present

## 2017-11-25 DIAGNOSIS — N186 End stage renal disease: Secondary | ICD-10-CM | POA: Diagnosis not present

## 2017-11-25 DIAGNOSIS — Z992 Dependence on renal dialysis: Secondary | ICD-10-CM | POA: Diagnosis not present

## 2017-11-27 DIAGNOSIS — N186 End stage renal disease: Secondary | ICD-10-CM | POA: Diagnosis not present

## 2017-11-27 DIAGNOSIS — N2581 Secondary hyperparathyroidism of renal origin: Secondary | ICD-10-CM | POA: Diagnosis not present

## 2017-12-02 DIAGNOSIS — N186 End stage renal disease: Secondary | ICD-10-CM | POA: Diagnosis not present

## 2017-12-02 DIAGNOSIS — N2581 Secondary hyperparathyroidism of renal origin: Secondary | ICD-10-CM | POA: Diagnosis not present

## 2017-12-04 DIAGNOSIS — N2581 Secondary hyperparathyroidism of renal origin: Secondary | ICD-10-CM | POA: Diagnosis not present

## 2017-12-04 DIAGNOSIS — N186 End stage renal disease: Secondary | ICD-10-CM | POA: Diagnosis not present

## 2017-12-07 DIAGNOSIS — N2581 Secondary hyperparathyroidism of renal origin: Secondary | ICD-10-CM | POA: Diagnosis not present

## 2017-12-07 DIAGNOSIS — N186 End stage renal disease: Secondary | ICD-10-CM | POA: Diagnosis not present

## 2017-12-11 DIAGNOSIS — N2581 Secondary hyperparathyroidism of renal origin: Secondary | ICD-10-CM | POA: Diagnosis not present

## 2017-12-11 DIAGNOSIS — N186 End stage renal disease: Secondary | ICD-10-CM | POA: Diagnosis not present

## 2017-12-17 DIAGNOSIS — Z0000 Encounter for general adult medical examination without abnormal findings: Secondary | ICD-10-CM | POA: Diagnosis not present

## 2017-12-23 DIAGNOSIS — N186 End stage renal disease: Secondary | ICD-10-CM | POA: Diagnosis not present

## 2017-12-23 DIAGNOSIS — N2581 Secondary hyperparathyroidism of renal origin: Secondary | ICD-10-CM | POA: Diagnosis not present

## 2017-12-25 DIAGNOSIS — N186 End stage renal disease: Secondary | ICD-10-CM | POA: Diagnosis not present

## 2017-12-25 DIAGNOSIS — N390 Urinary tract infection, site not specified: Secondary | ICD-10-CM | POA: Diagnosis not present

## 2017-12-25 DIAGNOSIS — N2581 Secondary hyperparathyroidism of renal origin: Secondary | ICD-10-CM | POA: Diagnosis not present

## 2017-12-26 DIAGNOSIS — N186 End stage renal disease: Secondary | ICD-10-CM | POA: Diagnosis not present

## 2017-12-26 DIAGNOSIS — Z992 Dependence on renal dialysis: Secondary | ICD-10-CM | POA: Diagnosis not present

## 2017-12-26 DIAGNOSIS — I120 Hypertensive chronic kidney disease with stage 5 chronic kidney disease or end stage renal disease: Secondary | ICD-10-CM | POA: Diagnosis not present

## 2017-12-28 DIAGNOSIS — D631 Anemia in chronic kidney disease: Secondary | ICD-10-CM | POA: Diagnosis not present

## 2017-12-28 DIAGNOSIS — N186 End stage renal disease: Secondary | ICD-10-CM | POA: Diagnosis not present

## 2017-12-28 DIAGNOSIS — N2581 Secondary hyperparathyroidism of renal origin: Secondary | ICD-10-CM | POA: Diagnosis not present

## 2017-12-30 DIAGNOSIS — D631 Anemia in chronic kidney disease: Secondary | ICD-10-CM | POA: Diagnosis not present

## 2017-12-30 DIAGNOSIS — N186 End stage renal disease: Secondary | ICD-10-CM | POA: Diagnosis not present

## 2017-12-30 DIAGNOSIS — N2581 Secondary hyperparathyroidism of renal origin: Secondary | ICD-10-CM | POA: Diagnosis not present

## 2018-01-01 DIAGNOSIS — D631 Anemia in chronic kidney disease: Secondary | ICD-10-CM | POA: Diagnosis not present

## 2018-01-01 DIAGNOSIS — N186 End stage renal disease: Secondary | ICD-10-CM | POA: Diagnosis not present

## 2018-01-01 DIAGNOSIS — N2581 Secondary hyperparathyroidism of renal origin: Secondary | ICD-10-CM | POA: Diagnosis not present

## 2018-01-02 ENCOUNTER — Emergency Department (HOSPITAL_COMMUNITY): Payer: Medicare Other

## 2018-01-02 ENCOUNTER — Encounter (HOSPITAL_COMMUNITY): Payer: Self-pay | Admitting: Emergency Medicine

## 2018-01-02 ENCOUNTER — Emergency Department (HOSPITAL_COMMUNITY)
Admission: EM | Admit: 2018-01-02 | Discharge: 2018-01-03 | Disposition: A | Discharge status: Home / Self Care | Payer: Medicare Other | Attending: Emergency Medicine | Admitting: Emergency Medicine

## 2018-01-02 DIAGNOSIS — Z79899 Other long term (current) drug therapy: Secondary | ICD-10-CM | POA: Insufficient documentation

## 2018-01-02 DIAGNOSIS — F17210 Nicotine dependence, cigarettes, uncomplicated: Secondary | ICD-10-CM | POA: Insufficient documentation

## 2018-01-02 DIAGNOSIS — Z91040 Latex allergy status: Secondary | ICD-10-CM | POA: Diagnosis not present

## 2018-01-02 DIAGNOSIS — N186 End stage renal disease: Secondary | ICD-10-CM

## 2018-01-02 DIAGNOSIS — I120 Hypertensive chronic kidney disease with stage 5 chronic kidney disease or end stage renal disease: Secondary | ICD-10-CM | POA: Insufficient documentation

## 2018-01-02 DIAGNOSIS — Z992 Dependence on renal dialysis: Secondary | ICD-10-CM | POA: Insufficient documentation

## 2018-01-02 DIAGNOSIS — K5900 Constipation, unspecified: Secondary | ICD-10-CM | POA: Diagnosis not present

## 2018-01-02 DIAGNOSIS — N3000 Acute cystitis without hematuria: Secondary | ICD-10-CM | POA: Diagnosis not present

## 2018-01-02 DIAGNOSIS — K6289 Other specified diseases of anus and rectum: Secondary | ICD-10-CM

## 2018-01-02 DIAGNOSIS — K5909 Other constipation: Secondary | ICD-10-CM | POA: Diagnosis not present

## 2018-01-02 DIAGNOSIS — Q059 Spina bifida, unspecified: Secondary | ICD-10-CM | POA: Insufficient documentation

## 2018-01-02 DIAGNOSIS — R079 Chest pain, unspecified: Secondary | ICD-10-CM | POA: Diagnosis not present

## 2018-01-02 DIAGNOSIS — D489 Neoplasm of uncertain behavior, unspecified: Secondary | ICD-10-CM

## 2018-01-02 DIAGNOSIS — Z91010 Allergy to peanuts: Secondary | ICD-10-CM | POA: Insufficient documentation

## 2018-01-02 LAB — BASIC METABOLIC PANEL
Anion gap: 9 (ref 5–15)
BUN: 19 mg/dL (ref 6–20)
CO2: 27 mmol/L (ref 22–32)
Calcium: 10.1 mg/dL (ref 8.9–10.3)
Chloride: 101 mmol/L (ref 101–111)
Creatinine, Ser: 4.51 mg/dL — ABNORMAL HIGH (ref 0.44–1.00)
GFR calc Af Amer: 15 mL/min — ABNORMAL LOW (ref >60)
GFR calc non Af Amer: 13 mL/min — ABNORMAL LOW (ref >60)
Glucose, Bld: 94 mg/dL (ref 65–99)
Potassium: 5 mmol/L (ref 3.5–5.1)
Sodium: 137 mmol/L (ref 135–145)

## 2018-01-02 LAB — URINALYSIS, ROUTINE W REFLEX MICROSCOPIC
BACTERIA UA: NONE SEEN
Bilirubin Urine: NEGATIVE
Glucose, UA: NEGATIVE mg/dL
Ketones, ur: NEGATIVE mg/dL
Nitrite: NEGATIVE
PROTEIN: 100 mg/dL — AB
SPECIFIC GRAVITY, URINE: 1.008 (ref 1.005–1.030)
pH: 8 (ref 5.0–8.0)

## 2018-01-02 LAB — CBC
HCT: 31.2 % — ABNORMAL LOW (ref 36.0–46.0)
HEMOGLOBIN: 9.3 g/dL — AB (ref 12.0–15.0)
MCH: 27.4 pg (ref 26.0–34.0)
MCHC: 29.8 g/dL — ABNORMAL LOW (ref 30.0–36.0)
MCV: 91.8 fL (ref 78.0–100.0)
PLATELETS: 253 10*3/uL (ref 150–400)
RBC: 3.4 MIL/uL — AB (ref 3.87–5.11)
RDW: 15 % (ref 11.5–15.5)
WBC: 7.2 10*3/uL (ref 4.0–10.5)

## 2018-01-02 LAB — I-STAT TROPONIN, ED
TROPONIN I, POC: 0 ng/mL (ref 0.00–0.08)
Troponin i, poc: 0 ng/mL (ref 0.00–0.08)

## 2018-01-02 LAB — I-STAT BETA HCG BLOOD, ED (MC, WL, AP ONLY)

## 2018-01-02 LAB — D-DIMER, QUANTITATIVE (NOT AT ARMC)

## 2018-01-02 MED ORDER — FLEET ENEMA 7-19 GM/118ML RE ENEM
1.0000 | ENEMA | Freq: Once | RECTAL | Status: AC
  Administered 2018-01-02: 1 via RECTAL
  Filled 2018-01-02: qty 1

## 2018-01-02 MED ORDER — PIPERACILLIN-TAZOBACTAM 3.375 G IVPB
3.3750 g | Freq: Once | INTRAVENOUS | Status: AC
  Administered 2018-01-02: 3.375 g via INTRAVENOUS
  Filled 2018-01-02: qty 50

## 2018-01-02 MED ORDER — ONDANSETRON HCL 4 MG/2ML IJ SOLN
4.0000 mg | Freq: Once | INTRAMUSCULAR | Status: AC
  Administered 2018-01-02: 4 mg via INTRAVENOUS
  Filled 2018-01-02: qty 2

## 2018-01-02 MED ORDER — LIDOCAINE HCL URETHRAL/MUCOSAL 2 % EX GEL
1.0000 "application " | Freq: Once | CUTANEOUS | Status: AC
  Administered 2018-01-02: 1 via TOPICAL
  Filled 2018-01-02: qty 20

## 2018-01-02 MED ORDER — PIPERACILLIN-TAZOBACTAM IN DEX 2-0.25 GM/50ML IV SOLN: 2.2500 g | Freq: Once | INTRAVENOUS | Status: DC

## 2018-01-02 MED ORDER — DIPHENHYDRAMINE HCL 50 MG/ML IJ SOLN
25.0000 mg | Freq: Once | INTRAMUSCULAR | Status: AC
  Administered 2018-01-02: 25 mg via INTRAVENOUS
  Filled 2018-01-02: qty 1

## 2018-01-02 MED ORDER — MORPHINE SULFATE (PF) 4 MG/ML IV SOLN
4.0000 mg | Freq: Once | INTRAVENOUS | Status: AC
  Administered 2018-01-02: 4 mg via INTRAVENOUS
  Filled 2018-01-02 (×2): qty 1

## 2018-01-02 NOTE — ED Provider Notes (Addendum)
[facility] EMERGENCY DEPARTMENT Provider Note   CSN: [id number] Arrival date & time: 01/02/18  1539     History   Chief Complaint Chief Complaint  Patient presents with  . Chest Pain  . Constipation    HPI [name] is a 27 y.o. female hx of spinal bifida and wheelchair bound at baseline, ESRD on HD (last HD was yesterday), here with chest pain, SOB, constipation. Patient states that she was unable to have a bowel movement for the last 2 weeks. Tried senna, dulcolax and enema with no relief. She also noticed dark urine but had urinalysis done last week that was unremarkable. Patient did finish dialysis yesterday. Today, she had sudden onset of chest pain and shortness of breath. She denies any fever or chills or cough. Denies any previous blood clots and not on blood thinners.   The history is provided by the patient.    Past Medical History:  Diagnosis Date  . Anemia associated with chronic renal failure   . Blood transfusion   . 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  . Depression 05/14/2015  . Dialysis care   . ESRD (end stage renal disease) on dialysis ([place])   . GERD (gastroesophageal reflux disease) 01/07/2017  . HTN (hypertension) 05/02/2011  . Spina bifida   . UTI (lower urinary tract infection)     Patient Active Problem List   Diagnosis Date Noted  . GERD (gastroesophageal reflux disease) 01/07/2017  . Nausea with vomiting 01/07/2017  . Pyelonephritis 07/31/2015  . Back pain 07/31/2015  . Abdominal pain 07/31/2015  . Constipation 07/31/2015  . Major depressive disorder, single episode, severe without psychotic features ([place]) 05/15/2015  . Spina bifida ([place]) 01/18/2012  . ESRD (end stage renal disease) on dialysis ([place]) 05/02/2011  . Anemia 05/02/2011  . HTN (hypertension) 05/02/2011    Past Surgical History:  Procedure Laterality Date  . AV FISTULA PLACEMENT     left arm     OB History    Gravida  0   Para    0   Term  0   Preterm  0   AB  0   Living  0     SAB  0   TAB  0   Ectopic  0   Multiple  0   Live Births  0            Home Medications    Prior to Admission medications   Medication Sig Start Date End Date Taking? Authorizing Provider  Epoetin Alfa (EPOGEN IJ) Inject as directed every Monday, Wednesday, and Friday with hemodialysis.    Yes [provider]  multivitamin (RENA-VIT) TABS tablet Take 1 tablet by mouth every Monday, Wednesday, and Friday with hemodialysis.    Yes [provider]  omeprazole (PRILOSEC) 20 MG capsule Take 20 mg by mouth 2 (two) times daily before a meal.  03/26/16  Yes [provider]  sevelamer carbonate (RENVELA) 800 MG tablet Take 2,400 mg by mouth See admin instructions. Take 2,400 mg by mouth one to three times a day, depending on number of meals consumed daily   Yes [provider]  amoxicillin-clavulanate (AUGMENTIN) 250-125 MG tablet Take 1 tablet by mouth daily for 7 days. On dialysis days, take after treatment 01/03/18 01/10/18  Muthersbaugh, Jarrett Soho, PA-C  doxycycline (VIBRAMYCIN) 100 MG capsule Take 1 capsule (100 mg total) by mouth 2 (two) times daily. One po bid x 7 days Patient  not taking: Reported on 01/02/2018 02/01/17   Ripley Fraise, MD  HYDROcodone-acetaminophen (NORCO/VICODIN) 5-325 MG tablet Take 1 tablet by mouth every 6 (six) hours as needed. Patient not taking: Reported on 01/02/2018 02/01/17   Ripley Fraise, MD  hydrocortisone (ANUSOL-HC) 25 MG suppository Place 1 suppository (25 mg total) rectally daily. For 7 days 01/03/18   Muthersbaugh, Jarrett Soho, PA-C  linaclotide [facility]) 72 MCG capsule Take 1 capsule (72 mcg total) by mouth daily before breakfast. Patient not taking: Reported on 01/02/2018 01/07/17   Carlis Stable, NP  ondansetron (ZOFRAN) 4 MG tablet Take 1 tablet (4 mg total) by mouth every 8 (eight) hours as needed for nausea or vomiting. Patient not taking: Reported on 01/02/2018 01/07/17    Carlis Stable, NP  pantoprazole (PROTONIX) 40 MG tablet Take 1 tablet (40 mg total) by mouth 2 (two) times daily before a meal. Patient not taking: Reported on 01/02/2018 01/07/17   Carlis Stable, NP    Family History Family History  Problem Relation Age of Onset  . Kidney cancer Other   . Hypertension Maternal Grandmother   . Arthritis Maternal Grandmother   . Breast cancer Maternal Aunt   . Colon cancer Neg Hx     Social History Social History   Tobacco Use  . Smoking status: Current Every Day Smoker    Packs/day: 0.10    Types: Cigarettes  . Smokeless tobacco: Never Used  . Tobacco comment: 2 cigs a day  Substance Use Topics  . Alcohol use: No  . Drug use: No     Allergies   Ciprofloxacin; Other; Peanut-containing drug products; Aleve [naproxen sodium]; Ceftriaxone; Coconut oil; Influenza vaccines; Tetanus toxoid, adsorbed; Tetanus toxoids; and Latex   Review of Systems Review of Systems  Cardiovascular: Positive for chest pain.  Gastrointestinal: Positive for constipation.  All other systems reviewed and are negative.    Physical Exam Updated Vital Signs BP 129/69   Pulse 90   Temp 98 F (36.7 C) (Oral)   Resp 16   SpO2 99%   Physical Exam  Constitutional:  Older than stated age   HENT:  Head: Normocephalic.  Eyes: Pupils are equal, round, and reactive to light. EOM are normal.  Neck: Normal range of motion.  Cardiovascular: Normal pulses.  Tachycardic   Pulmonary/Chest: Effort normal and breath sounds normal.  Abdominal: Soft. Bowel sounds are normal.  Genitourinary:  Genitourinary Comments: Rectal- stool impaction   Musculoskeletal:  Bilateral legs contracted (chronic from known spinal bifida)   Neurological: She is alert.  Skin: Skin is warm. Capillary refill takes less than 2 seconds.  Psychiatric: She has a normal mood and affect. Her behavior is normal.  Nursing note and vitals reviewed.    ED Treatments / Results  Labs (all labs  ordered are listed, but only abnormal results are displayed) Labs Reviewed  URINE CULTURE - Abnormal; Notable for the following components:      Result Value   Culture >=100,000 COLONIES/mL VIRIDANS STREPTOCOCCUS (*)    All other components within normal limits  BASIC METABOLIC PANEL - Abnormal; Notable for the following components:   Creatinine, Ser 4.51 (*)    GFR calc non Af Amer 13 (*)    GFR calc Af Amer 15 (*)    All other components within normal limits  CBC - Abnormal; Notable for the following components:   RBC 3.40 (*)    Hemoglobin 9.3 (*)    HCT 31.2 (*)    MCHC 29.8 (*)  All other components within normal limits  URINALYSIS, ROUTINE W REFLEX MICROSCOPIC - Abnormal; Notable for the following components:   APPearance TURBID (*)    Hgb urine dipstick SMALL (*)    Protein, ur 100 (*)    Leukocytes, UA LARGE (*)    WBC, UA >50 (*)    All other components within normal limits  D-DIMER, QUANTITATIVE (NOT AT [facility])  I-STAT TROPONIN, ED  I-STAT BETA HCG BLOOD, ED ([facility], [facility], [facility] ONLY)  I-STAT TROPONIN, ED    EKG EKG Interpretation  Date/Time:  Saturday January 02 2018 18:53:01 EDT Ventricular Rate:  105 PR Interval:  110 QRS Duration: 75 QT Interval:  328 QTC Calculation: 434 R Axis:   45 Text Interpretation:  Sinus tachycardia No significant change since last tracing Confirmed by Wandra Arthurs [phone]) on 01/02/2018 6:56:09 PM Also confirmed by Wandra Arthurs [phone]), editor Lynder Parents [phone])  on 01/03/2018 9:55:28 AM   Radiology No results found.  Procedures Fecal disimpaction Date/Time: 01/02/2018 9:41 PM Performed by: Drenda Freeze, MD Authorized by: Drenda Freeze, MD  Consent: Verbal consent obtained. Risks and benefits: risks, benefits and alternatives were discussed Consent given by: patient Patient understanding: patient states understanding of the procedure being performed Patient consent: the patient's understanding of the procedure matches  consent given Patient identity confirmed: verbally with patient Preparation: Patient was prepped and draped in the usual sterile fashion. Local anesthesia used: no  Anesthesia: Local anesthesia used: no  Sedation: Patient sedated: no  Patient tolerance: Patient tolerated the procedure well with no immediate complications Comments: Digital disimpaction done     (including critical care time)    Medications Ordered in ED Medications  lidocaine (XYLOCAINE) 2 % jelly 1 application (1 application Topical Given by Other 01/02/18 1912)  sodium phosphate (FLEET) 7-19 GM/118ML enema 1 enema (1 enema Rectal Given 01/02/18 2050)  morphine 4 MG/ML injection 4 mg (4 mg Intravenous Given 01/02/18 2147)  piperacillin-tazobactam (ZOSYN) IVPB 3.375 g (0 g Intravenous Stopped 01/02/18 2240)  ondansetron (ZOFRAN) injection 4 mg (4 mg Intravenous Given 01/02/18 2127)  diphenhydrAMINE (BENADRYL) injection 25 mg (25 mg Intravenous Given 01/02/18 2151)  ondansetron (ZOFRAN) injection 4 mg (4 mg Intravenous Given 01/02/18 2151)  amoxicillin-clavulanate (AUGMENTIN) 250-125 MG per tablet 1 tablet (1 tablet Oral Given 01/03/18 0037)     Initial Impression / Assessment and Plan / ED Course  I have reviewed the triage vital signs and the nursing notes.  Pertinent labs & imaging results that were available during my care of the patient were reviewed by me and considered in my medical decision making (see chart for details).  Clinical Course as of Jan 05 718  Sun Jan 03, 2018  0010 Tolerating p.o. without difficulty.   [HM]  0010 Tachycardia resolved.  Pulse Rate: 90 [HM]  0011 Patient has had small bowel movement here in the emergency department.   [HM]    Clinical Course User Index [HM] Muthersbaugh, Jarrett Soho, PA-C    CALLE SCHADER is a 27 y.o. female hx of ESRD on HD here with constipation, palpitations, SOB. Patient tachycardic in the ED and she is wheelchair bound. Consider PE vs pain from constipation vs  electrolyte abnormality. Will disimpact patient and give enema. Will get d-dimer, labs, CXR.   9:42 pm UA + UTI but this appears a chronic finding. Previous urine cultures either are negative or had multiple species. She has allergy to rocephin, cipro. Also persistently tachycardic to 109. D-dimer negative. Has  nausea and abdominal pain now. Will get CT ab/pel. Given zosyn in the ED.   10:09 PM Patient had some itching with morphine, given benadryl. CT ab/pel pending. Sign out pending reassessment after bowel movement. Anticipate dc home with augmentin if she has no reactions to zosyn. Singed out to overnight PA.      Final Clinical Impressions(s) / ED Diagnoses   Final diagnoses:  Other constipation  Central chest pain  Acute cystitis without hematuria  Constipation, unspecified constipation type  ESRD (end stage renal disease) on dialysis [facility])  Proctitis  Teratoma    ED Discharge Orders        Ordered    amoxicillin-clavulanate (AUGMENTIN) 250-125 MG tablet  Daily     01/03/18 0021    hydrocortisone (ANUSOL-HC) 25 MG suppository  Daily     01/03/18 0022       Drenda Freeze, MD 01/02/18 2210    Drenda Freeze, MD 01/02/18 2211    Drenda Freeze, MD 01/05/18 [phone]

## 2018-01-02 NOTE — ED Triage Notes (Signed)
Pt presents to ED for assessment of 2 weeks of constipation, and chest pain starting 45 minutes ago, sudden onset, while patient was sitting in chair.  C/o SOB, a "funny" feeling to her jaw, and nausea.  Pt states 2 episodes of vomiting.  Dialysis treatment yesterday, 3 hours completed.

## 2018-01-02 NOTE — ED Notes (Signed)
ED Provider at bedside. 

## 2018-01-03 DIAGNOSIS — K5909 Other constipation: Secondary | ICD-10-CM | POA: Diagnosis not present

## 2018-01-03 MED ORDER — AMOXICILLIN-POT CLAVULANATE 250-125 MG PO TABS: 1.0000 | ORAL_TABLET | Freq: Every day | ORAL | 0 refills | Status: AC

## 2018-01-03 MED ORDER — HYDROCORTISONE ACETATE 25 MG RE SUPP: 25.0000 mg | Freq: Every day | RECTAL | 0 refills | Status: DC

## 2018-01-03 MED ORDER — AMOXICILLIN-POT CLAVULANATE 250-125 MG PO TABS
1.0000 | ORAL_TABLET | Freq: Once | ORAL | Status: AC
  Administered 2018-01-03: 1 via ORAL
  Filled 2018-01-03: qty 1

## 2018-01-03 NOTE — ED Provider Notes (Signed)
Care assumed from Shirlyn Goltz, MD.  Please see her full H&P.  In short,  Leather A Furnari is a 27 y.o. female with a hx of spina bifida, wheelchair bound presents for chest pain, SOB and constipation.  Patient reports no bowel movement for 2 weeks.  She is tried senna, Dulcolax and enema without relief.  Additionally, patient with dark urine.  She did finish dialysis yesterday without complications.   Physical Exam  BP 139/86 (BP Location: Right Arm)   Pulse 90   Temp 98.8 F (37.1 C) (Oral)   Resp (!) 23   SpO2 99%   Physical Exam  Constitutional:  Non-toxic appearance. She does not appear ill. No distress.  HENT:  Head: Normocephalic.  Eyes: Conjunctivae are normal. No scleral icterus.  Neck: Normal range of motion.  Cardiovascular: Normal rate and intact distal pulses.  Pulmonary/Chest: Effort normal.  Neurological: She is alert.  Skin: Skin is warm and dry.  Nursing note and vitals reviewed.   ED Course/Procedures   Clinical Course as of Jan 04 32  Sun Jan 03, 2018  0010 Tolerating p.o. without difficulty.   [HM]  0010 Tachycardia resolved.  Pulse Rate: 90 [HM]  0011 Patient has had small bowel movement here in the emergency department.   [HM]    Clinical Course User Index [HM] Edina Winningham, Gwenlyn Perking    Results for orders placed or performed during the hospital encounter of 67/20/94  Basic metabolic panel  Result Value Ref Range   Sodium 137 135 - 145 mmol/L   Potassium 5.0 3.5 - 5.1 mmol/L   Chloride 101 101 - 111 mmol/L   CO2 27 22 - 32 mmol/L   Glucose, Bld 94 65 - 99 mg/dL   BUN 19 6 - 20 mg/dL   Creatinine, Ser 4.51 (H) 0.44 - 1.00 mg/dL   Calcium 10.1 8.9 - 10.3 mg/dL   GFR calc non Af Amer 13 (L) >60 mL/min   GFR calc Af Amer 15 (L) >60 mL/min   Anion gap 9 5 - 15  CBC  Result Value Ref Range   WBC 7.2 4.0 - 10.5 K/uL   RBC 3.40 (L) 3.87 - 5.11 MIL/uL   Hemoglobin 9.3 (L) 12.0 - 15.0 g/dL   HCT 31.2 (L) 36.0 - 46.0 %   MCV 91.8 78.0 - 100.0  fL   MCH 27.4 26.0 - 34.0 pg   MCHC 29.8 (L) 30.0 - 36.0 g/dL   RDW 15.0 11.5 - 15.5 %   Platelets 253 150 - 400 K/uL  D-dimer, quantitative (not at [facility])  Result Value Ref Range   D-Dimer, Quant <0.27 0.00 - 0.50 ug/mL-FEU  Urinalysis, Routine w reflex microscopic  Result Value Ref Range   Color, Urine YELLOW YELLOW   APPearance TURBID (A) CLEAR   Specific Gravity, Urine 1.008 1.005 - 1.030   pH 8.0 5.0 - 8.0   Glucose, UA NEGATIVE NEGATIVE mg/dL   Hgb urine dipstick SMALL (A) NEGATIVE   Bilirubin Urine NEGATIVE NEGATIVE   Ketones, ur NEGATIVE NEGATIVE mg/dL   Protein, ur 100 (A) NEGATIVE mg/dL   Nitrite NEGATIVE NEGATIVE   Leukocytes, UA LARGE (A) NEGATIVE   RBC / HPF 11-20 0 - 5 RBC/hpf   WBC, UA >50 (H) 0 - 5 WBC/hpf   Bacteria, UA NONE SEEN NONE SEEN   WBC Clumps PRESENT   I-stat troponin, ED  Result Value Ref Range   Troponin i, poc 0.00 0.00 - 0.08 ng/mL   Comment 3  I-Stat beta hCG blood, ED  Result Value Ref Range   I-stat hCG, quantitative <5.0 <5 mIU/mL   Comment 3          I-Stat Troponin, ED (not at [facility])  Result Value Ref Range   Troponin i, poc 0.00 0.00 - 0.08 ng/mL   Comment 3           Ct Abdomen Pelvis Wo Contrast  Result Date: 01/02/2018 CLINICAL DATA:  Subacute onset of constipation and generalized chest pain. Shortness of breath. Nausea. EXAM: CT ABDOMEN AND PELVIS WITHOUT CONTRAST TECHNIQUE: Multidetector CT imaging of the abdomen and pelvis was performed following the standard protocol without IV contrast. COMPARISON:  CT of the abdomen and pelvis performed 04/04/2017 FINDINGS: Lower chest: The visualized lung bases are grossly clear. The visualized portions of the mediastinum are unremarkable. Hepatobiliary: The liver is unremarkable in appearance. The gallbladder is unremarkable in appearance. The common bile duct remains normal in caliber. Pancreas: The pancreas is within normal limits. Spleen: The spleen is unremarkable. Adrenals/Urinary  Tract: The adrenal glands are grossly unremarkable in appearance, though difficult to fully characterize without contrast. 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 Underlying marked dilatation of the left ureter is again noted, to the level of the patient's neurogenic bladder. Stomach/Bowel: Visualized small and large bowel loops are grossly unremarkable. The stomach is largely filled with fluid. The appendix appears to be grossly normal in caliber, without evidence of appendicitis. There is distention of the rectum with stool, as on the prior study, measuring up to 7.4 cm, concerning for fecal impaction. Underlying wall thickening may reflect chronic inflammation or proctitis. Vascular/Lymphatic: The abdominal aorta is unremarkable in appearance. The inferior vena cava is grossly unremarkable. No retroperitoneal lymphadenopathy is seen. No pelvic sidewall lymphadenopathy is identified. Reproductive: The bladder is diffusely thick walled and noted at the left lower quadrant, reflecting chronic neurogenic bladder. 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 This is relatively similar in size to 2018, given differences in measurement technique. The uterus and ovaries are not well characterized. Other: A 4.5 cm cystic lesion is noted at the expected location of the coccyx, with congenital absence of the overlying sacrum, coccyx and lumbar spine. 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 It measured 2.5 cm in 2013. Musculoskeletal: No acute osseous abnormalities are identified. The visualized musculature is unremarkable in appearance. IMPRESSION: 1. Distention of the rectum with stool, as on the prior study, measuring up to 7.4 cm, concerning for fecal impaction. Underlying wall thickening may reflect chronic inflammation or  proctitis. 2. 4.5 cm cystic lesion noted at the expected location of the coccyx, with congenital absence of the overlying sacrum, coccyx and lumbar spine. 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 It measured 2.5 cm in 2013. This has not been described on prior studies; clinical correlation is suggested. 3. 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 Underlying marked dilatation of the left ureter again noted, to the level of the patient's neurogenic bladder. 4. 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 This is relatively similar in size to 2018, given differences in measurement technique. Electronically Signed   By: Garald Balding M.D.   On: 01/02/2018 22:12  Dg Chest 2 View  Result Date: 01/02/2018 CLINICAL DATA:  Chest pain.  End-stage renal disease with dialysis. EXAM: CHEST - 2 VIEW COMPARISON:  Two-view chest x-ray 04/03/2017 FINDINGS: The heart size and mediastinal contours are within normal limits. Both lungs are clear. The visualized skeletal structures are unremarkable. IMPRESSION: Negative two view chest x-ray Electronically Signed   By: San Morelle M.D.   On: 01/02/2018 16:55    Procedures  MDM    Patient presents with multiple complaints.  She has had no bowel movement in 2 weeks.  Initial provider completed disimpaction patient was given enema here with small bowel movement.  She complained of nausea but had no vomiting.  She was given additional medication for this.  Additionally, patient was complaining of severe pain.  She was given morphine for this but had a questionable allergic reaction.  She was given Benadryl and symptoms resolved.  Patient with normal chest x-ray, nonischemic EKG and negative troponin x2.  No evidence of acute coronary syndrome.  Patient is wheelchair-bound and  tachycardic on arrival.  D-dimer is negative.  Low likelihood of pulmonary embolism.  Additionally, patient complains of urinary tract symptoms.  Urinalysis showed bacteria and too numerous to count white blood cells.  She was given a round of Zosyn here in the emergency department.  I discussed home medications with ED pharmacist who recommends 1 dose of Augmentin here and Augmentin 250 mg daily for the next 7 days.  CT scan shows severe constipation with some evidence of proctitis.  Will be given Anusol suppository for this.  Suspect this is secondary to her severe constipation.  I personally evaluated these images.  In addition, it appears patient has an enlarging cyst in the coccyx region.  Some possible teratoma.  This was present in 2013 but it is almost twice the size since that time.  Long discussion with patient about this and the need for close primary care follow-up for it.  She states understanding and is in agreement with this plan.  12:25 AM She reports she is feeling better.  She has tolerated p.o. without additional nausea or vomiting.  She reports she feels better after small bowel movement.  Patient's tachycardia has resolved and she is not febrile.  Will discharge to home.   Other constipation  Central chest pain  Acute cystitis without hematuria  Constipation, unspecified constipation type  ESRD (end stage renal disease) on dialysis [facility])  Proctitis  Teratoma     Marykay Mccleod, Gwenlyn Perking 01/03/18 0034    Carmin Muskrat, MD 01/03/18 [phone]

## 2018-01-03 NOTE — ED Notes (Signed)
Pt given PO challenge per EDPA

## 2018-01-03 NOTE — Discharge Instructions (Addendum)
1. Medications: Miralax, Augmetin, Anusol, usual home medications 2. Treatment: rest, drink plenty of fluids,  3. Follow Up: Please followup with your primary doctor in 2 days for discussion of your diagnoses and further evaluation after today's visit; if you do not have a primary care doctor use the resource guide provided to find one; Please return to the ER for fevers, worsening symptoms, worsening abdominal pain or other concerns.

## 2018-01-04 DIAGNOSIS — D631 Anemia in chronic kidney disease: Secondary | ICD-10-CM | POA: Diagnosis not present

## 2018-01-04 DIAGNOSIS — N186 End stage renal disease: Secondary | ICD-10-CM | POA: Diagnosis not present

## 2018-01-04 DIAGNOSIS — N2581 Secondary hyperparathyroidism of renal origin: Secondary | ICD-10-CM | POA: Diagnosis not present

## 2018-01-04 LAB — URINE CULTURE: Culture: >=100000 — AB

## 2018-01-05 ENCOUNTER — Telehealth: Payer: Self-pay | Admitting: Emergency Medicine

## 2018-01-05 DIAGNOSIS — K5900 Constipation, unspecified: Secondary | ICD-10-CM | POA: Diagnosis not present

## 2018-01-05 DIAGNOSIS — Z992 Dependence on renal dialysis: Secondary | ICD-10-CM | POA: Diagnosis not present

## 2018-01-05 DIAGNOSIS — Z590 Homelessness: Secondary | ICD-10-CM | POA: Diagnosis not present

## 2018-01-05 DIAGNOSIS — Q059 Spina bifida, unspecified: Secondary | ICD-10-CM | POA: Diagnosis not present

## 2018-01-05 NOTE — Telephone Encounter (Signed)
Post ED Visit - Positive Culture Follow-up  Culture report reviewed by antimicrobial stewardship pharmacist:  []  Elenor Quinones, Pharm.D. []  Heide Guile, Pharm.D., BCPS AQ-ID []  Parks Neptune, Pharm.D., BCPS [x]  Alycia Rossetti, Pharm.D., BCPS []  Alligator, Pharm.D., BCPS, AAHIVP []  Legrand Como, Pharm.D., BCPS, AAHIVP []  Salome Arnt, PharmD, BCPS []  Wynell Balloon, PharmD []  Vincenza Hews, PharmD, BCPS  Positive urine culture Treated with augmentin, organism sensitive to the same and no further patient follow-up is required at this time.  Hazle Nordmann 01/05/2018, 11:15 AM

## 2018-01-06 DIAGNOSIS — N186 End stage renal disease: Secondary | ICD-10-CM | POA: Diagnosis not present

## 2018-01-06 DIAGNOSIS — N2581 Secondary hyperparathyroidism of renal origin: Secondary | ICD-10-CM | POA: Diagnosis not present

## 2018-01-06 DIAGNOSIS — D631 Anemia in chronic kidney disease: Secondary | ICD-10-CM | POA: Diagnosis not present

## 2018-01-08 DIAGNOSIS — N186 End stage renal disease: Secondary | ICD-10-CM | POA: Diagnosis not present

## 2018-01-08 DIAGNOSIS — D631 Anemia in chronic kidney disease: Secondary | ICD-10-CM | POA: Diagnosis not present

## 2018-01-08 DIAGNOSIS — N2581 Secondary hyperparathyroidism of renal origin: Secondary | ICD-10-CM | POA: Diagnosis not present

## 2018-01-13 DIAGNOSIS — D631 Anemia in chronic kidney disease: Secondary | ICD-10-CM | POA: Diagnosis not present

## 2018-01-13 DIAGNOSIS — N186 End stage renal disease: Secondary | ICD-10-CM | POA: Diagnosis not present

## 2018-01-13 DIAGNOSIS — N2581 Secondary hyperparathyroidism of renal origin: Secondary | ICD-10-CM | POA: Diagnosis not present

## 2018-01-14 ENCOUNTER — Other Ambulatory Visit: Payer: Self-pay | Admitting: *Deleted

## 2018-01-14 NOTE — Patient Outreach (Signed)
[place] [facility]) Care Management  01/14/2018  [name] 07-09-1991 [id number]   Telephone Screen  Referral Date: 01/14/18 Referral Source: MD referral from Dr Lucianne Lei Referral Reason:Patient lives in motel/hotel No address or help dx spina bifida on dialysis- No home displaced  Insurance:medicare and medicaid  Outreach attempt #1 [facility] RN CM left HIPAA compliant voicemail message along with CM's contact info. Plan: [facility] RN CM sent an unsuccessful outreach letter and scheduled this patient for another call attempt within 4 business days  Cloyce Paterson L. Lavina Hamman, RN, BSN, CCM [facility] Telephonic Care Management Care Coordinator Direct number [phone]  Main [facility] number [phone] Fax number [phone]

## 2018-01-15 ENCOUNTER — Other Ambulatory Visit: Payer: Self-pay | Admitting: *Deleted

## 2018-01-15 DIAGNOSIS — D631 Anemia in chronic kidney disease: Secondary | ICD-10-CM | POA: Diagnosis not present

## 2018-01-15 DIAGNOSIS — N2581 Secondary hyperparathyroidism of renal origin: Secondary | ICD-10-CM | POA: Diagnosis not present

## 2018-01-15 DIAGNOSIS — N186 End stage renal disease: Secondary | ICD-10-CM | POA: Diagnosis not present

## 2018-01-15 NOTE — Patient Outreach (Signed)
[place] [facility]) Care Management  01/15/2018  [name] 1991/02/03 [id number]  Referral Date: 01/14/18 Referral Source: MD referral from Dr Lucianne Lei Referral Reason:Patient lives in motel/hotel No address or help dx spina bifida on dialysis- No home displaced  Insurance:medicare and medicaid  Telephone call #2 to patient who was advised of reason for call. Patient advised unable to take call now because on dialysis. States better to call back on Tues or Thurs after noon.  Plan: Follow up call; patient agrees with se appointment.   Sherrin Daisy, RN BSN [place] Management Coordinator [facility] Care Management  [phone]

## 2018-01-15 NOTE — Patient Outreach (Signed)
[place] [facility]) Care Management  01/15/2018  MEENAKSHI SAZAMA 08-04-1990 [id number]   Opened in error  Joelene Millin L. [place] Management Coordinator Direct Dial:  [phone]  Fax: [phone]  [address] [place], [place] [address] Website:  [email or web address]

## 2018-01-18 ENCOUNTER — Ambulatory Visit: Payer: Self-pay | Admitting: *Deleted

## 2018-01-19 ENCOUNTER — Other Ambulatory Visit: Payer: Self-pay | Admitting: *Deleted

## 2018-01-19 NOTE — Patient Outreach (Signed)
[facility]) Care Management  01/19/2018  KORISSA HORSFORD [birth year] [id number]  Referral Date:01/14/18 Referral Source:MD referral from Dr Lucianne Lei Referral Reason:Patient lives in motel/hotel No address or help dx spina bifida on dialysis- No home/ displaced Insurance:Medicare and Medicaid  Telephone call #3 to patient; HIPPA verification received.  Patient states major concern now is getting somewhere stable to stay. States she has been without stable home for 1 month. States she previously lived with her family in [place] but unable to live there anymore. States she "could not go into the reasons " she is not staying there now at this time. States she is in [place] now and staying with friend tonight. States she feels safe. States she does not currently have address because she stays with different friends.   States she has completed application for section 8 housing 3 weeks ago but that might not come through for 1 year. States she wants to find somewhere to stay in the interim. Voices that she went to dialysis on Monday but does not have a way to get there on Wednesday. States she can not use transportation that she used in the past because she does not have address. States her dialysis was not a priority at this time. Voices that she understands the dangers of not getting dialysis. States her priority is getting housing. States she has her wheelchair & belongings in plastic container. States she does not want to go to shelter.  States she has all of her medications and takes them as prescribed.   Patient consents to [facility] services of Social Worker for [employer] .  Plan: Referral to Education officer, museum for [employer], transportation to medical appointments.  Sherrin Daisy, RN BSN [place] Management Coordinator [facility] Care Management  [phone]

## 2018-01-21 ENCOUNTER — Other Ambulatory Visit: Payer: Self-pay

## 2018-01-21 NOTE — Patient Outreach (Signed)
[place] [facility]) Care Management  01/21/2018  [name] [birth year] [id number]   BSW talked with [name] about social work referral for housing and transportation resources.  Mrs. Rising reported that she has applied for section 8 housing but is currently on a wait list.  She has dialysis three times per week but is only able to go when she can find a ride.  She was previously approved for [place] and has Medicaid but reports that she is currently unable to utilize either service due to not having permanent address.  BSW called Courtney at [employer] to check on this and she reported that [name] does not need to have a permanent address to utilize the service but she does have to be at the address she reported when scheduling transport.  Loma Sousa said that her eligibility ended in 2017 so she would have to reapply.   BSW is scheduled to meet with [name] on Monday, 01/25/18 @ 4:30 after her dialysis appointment to provide housing resources and complete SCAT application if she desires.  Ronn Melena, BSW Social Worker [phone]

## 2018-01-22 ENCOUNTER — Ambulatory Visit: Payer: Self-pay | Admitting: *Deleted

## 2018-01-25 ENCOUNTER — Ambulatory Visit: Payer: Self-pay

## 2018-01-25 ENCOUNTER — Other Ambulatory Visit: Payer: Self-pay

## 2018-01-25 DIAGNOSIS — Z992 Dependence on renal dialysis: Secondary | ICD-10-CM | POA: Diagnosis not present

## 2018-01-25 DIAGNOSIS — D631 Anemia in chronic kidney disease: Secondary | ICD-10-CM | POA: Diagnosis not present

## 2018-01-25 DIAGNOSIS — I120 Hypertensive chronic kidney disease with stage 5 chronic kidney disease or end stage renal disease: Secondary | ICD-10-CM | POA: Diagnosis not present

## 2018-01-25 DIAGNOSIS — N186 End stage renal disease: Secondary | ICD-10-CM | POA: Diagnosis not present

## 2018-01-25 DIAGNOSIS — N2581 Secondary hyperparathyroidism of renal origin: Secondary | ICD-10-CM | POA: Diagnosis not present

## 2018-01-25 NOTE — Patient Outreach (Signed)
[place] [facility]) Care Management  01/25/2018  [name] 1991/05/10 [id number]   BSW received phone call from [name] regarding visit scheduled for today to discuss transportation and housing resources.  [name] requested that appointment be rescheduled because she was having a bad day at dialysis and not feeling well.  She reported that she has been having difficulty with her phone and had to call from another number.  BSW encouraged her to call tomorrow if she has access to a phone so that appointment can be rescheduled.    Ronn Melena, BSW Social Worker [phone]

## 2018-01-26 ENCOUNTER — Ambulatory Visit: Payer: Self-pay

## 2018-01-26 ENCOUNTER — Other Ambulatory Visit: Payer: Self-pay

## 2018-01-26 NOTE — Patient Outreach (Signed)
[place] [facility]) Care Management  01/26/2018  [name] [birth year] [id number]   Follow up call to [name] to reschedule visit regarding housing resources.  [name] reported that she is trying to get a new phone and would prefer to call me back regarding scheduling after that.  BSW will follow up with her next week if no return call is received.   Ronn Melena, BSW Social Worker [phone]

## 2018-01-27 DIAGNOSIS — D631 Anemia in chronic kidney disease: Secondary | ICD-10-CM | POA: Diagnosis not present

## 2018-01-27 DIAGNOSIS — N2581 Secondary hyperparathyroidism of renal origin: Secondary | ICD-10-CM | POA: Diagnosis not present

## 2018-01-27 DIAGNOSIS — N186 End stage renal disease: Secondary | ICD-10-CM | POA: Diagnosis not present

## 2018-01-29 DIAGNOSIS — N2581 Secondary hyperparathyroidism of renal origin: Secondary | ICD-10-CM | POA: Diagnosis not present

## 2018-01-29 DIAGNOSIS — N186 End stage renal disease: Secondary | ICD-10-CM | POA: Diagnosis not present

## 2018-01-29 DIAGNOSIS — D631 Anemia in chronic kidney disease: Secondary | ICD-10-CM | POA: Diagnosis not present

## 2018-02-01 ENCOUNTER — Ambulatory Visit: Payer: Self-pay

## 2018-02-01 DIAGNOSIS — D631 Anemia in chronic kidney disease: Secondary | ICD-10-CM | POA: Diagnosis not present

## 2018-02-01 DIAGNOSIS — N186 End stage renal disease: Secondary | ICD-10-CM | POA: Diagnosis not present

## 2018-02-01 DIAGNOSIS — N2581 Secondary hyperparathyroidism of renal origin: Secondary | ICD-10-CM | POA: Diagnosis not present

## 2018-02-02 ENCOUNTER — Other Ambulatory Visit: Payer: Self-pay

## 2018-02-02 NOTE — Patient Outreach (Signed)
[place] [facility]) Care Management  02/02/2018  [name] [birth year] [id number]   Follow up call to patient due to not hearing back from her last week.  Patient requested to meet at [facility] during her dialysis appointment next week. Appointment scheduled for Monday, 02/08/18 @ 1:00.    Ronn Melena, BSW Social Worker [phone]

## 2018-02-03 ENCOUNTER — Encounter (HOSPITAL_COMMUNITY): Payer: Self-pay | Admitting: *Deleted

## 2018-02-03 ENCOUNTER — Emergency Department (HOSPITAL_COMMUNITY)
Admission: EM | Admit: 2018-02-03 | Discharge: 2018-02-03 | Disposition: A | Discharge status: Home / Self Care | Payer: Medicare Other | Attending: Emergency Medicine | Admitting: Emergency Medicine

## 2018-02-03 ENCOUNTER — Other Ambulatory Visit: Payer: Self-pay

## 2018-02-03 DIAGNOSIS — Z5321 Procedure and treatment not carried out due to patient leaving prior to being seen by health care provider: Secondary | ICD-10-CM | POA: Insufficient documentation

## 2018-02-03 DIAGNOSIS — R0789 Other chest pain: Secondary | ICD-10-CM | POA: Diagnosis not present

## 2018-02-03 DIAGNOSIS — R079 Chest pain, unspecified: Secondary | ICD-10-CM | POA: Diagnosis not present

## 2018-02-03 DIAGNOSIS — N2581 Secondary hyperparathyroidism of renal origin: Secondary | ICD-10-CM | POA: Diagnosis not present

## 2018-02-03 DIAGNOSIS — N186 End stage renal disease: Secondary | ICD-10-CM | POA: Diagnosis not present

## 2018-02-03 DIAGNOSIS — D631 Anemia in chronic kidney disease: Secondary | ICD-10-CM | POA: Diagnosis not present

## 2018-02-03 LAB — CBC
HEMATOCRIT: 37.7 % (ref 36.0–46.0)
HEMOGLOBIN: 11.1 g/dL — AB (ref 12.0–15.0)
MCH: 28.3 pg (ref 26.0–34.0)
MCHC: 29.4 g/dL — ABNORMAL LOW (ref 30.0–36.0)
MCV: 96.2 fL (ref 78.0–100.0)
Platelets: 114 10*3/uL — ABNORMAL LOW (ref 150–400)
RBC: 3.92 MIL/uL (ref 3.87–5.11)
RDW: 19.9 % — ABNORMAL HIGH (ref 11.5–15.5)
WBC: 3.7 10*3/uL — AB (ref 4.0–10.5)

## 2018-02-03 LAB — BASIC METABOLIC PANEL
Anion gap: 13 (ref 5–15)
BUN: 9 mg/dL (ref 6–20)
CHLORIDE: 93 mmol/L — AB (ref 98–111)
CO2: 30 mmol/L (ref 22–32)
Calcium: 8.4 mg/dL — ABNORMAL LOW (ref 8.9–10.3)
Creatinine, Ser: 2.52 mg/dL — ABNORMAL HIGH (ref 0.44–1.00)
GFR calc Af Amer: 29 mL/min — ABNORMAL LOW (ref >60)
GFR calc non Af Amer: 25 mL/min — ABNORMAL LOW (ref >60)
Glucose, Bld: 71 mg/dL (ref 70–99)
POTASSIUM: 3.2 mmol/L — AB (ref 3.5–5.1)
SODIUM: 136 mmol/L (ref 135–145)

## 2018-02-03 LAB — I-STAT BETA HCG BLOOD, ED (MC, WL, AP ONLY): I-stat hCG, quantitative: <5 m[IU]/mL (ref <5)

## 2018-02-03 LAB — TROPONIN I

## 2018-02-03 NOTE — ED Notes (Signed)
Second call in lobby for pt. No response. RN notified

## 2018-02-03 NOTE — ED Triage Notes (Signed)
The pt arrived by [place] from dialysis whe was almost finished whe she began to have chest pain  She is curtrently hjaving chest pain  Fistula lt arm

## 2018-02-03 NOTE — ED Notes (Signed)
Pt called for vitals with no response.

## 2018-02-03 NOTE — ED Notes (Signed)
No answer when called for vitals. 

## 2018-02-03 NOTE — ED Notes (Signed)
Pt called 3 times in lobby for vitals and room and no response. Left AMA.

## 2018-02-04 ENCOUNTER — Other Ambulatory Visit: Payer: Self-pay

## 2018-02-04 NOTE — Patient Outreach (Signed)
[place] [facility]) Care Management  02/04/2018  [name] [birth year] [id number]   BSW received phone call from Kennis Carina, [place], regarding [name] coming to the ED yesterday due to chest pain.  BSW was also alerted of this in Epic.  [name] was called three times in lobby of ED with no response.   BSW and [name] discussed social work assistance being provided.  BSW was provided with contact information for [name]'s Education officer, museum, Thressa Sheller, at [employer] for collaboration.  BSW attempted to contact [name] but she is currently out of the office until 02/19/18.  BSW will attempt to contact her again at that time.   BSW talked with [name] and she confirmed that she left AMA.  She reported that the chest pains subsided and denied having chest pains today.  BSW confirmed appointment with her on Monday, 02/08/18.    Ronn Melena, BSW Social Worker [phone]

## 2018-02-05 DIAGNOSIS — D631 Anemia in chronic kidney disease: Secondary | ICD-10-CM | POA: Diagnosis not present

## 2018-02-05 DIAGNOSIS — N186 End stage renal disease: Secondary | ICD-10-CM | POA: Diagnosis not present

## 2018-02-05 DIAGNOSIS — N2581 Secondary hyperparathyroidism of renal origin: Secondary | ICD-10-CM | POA: Diagnosis not present

## 2018-02-08 ENCOUNTER — Ambulatory Visit: Payer: Self-pay

## 2018-02-08 DIAGNOSIS — D631 Anemia in chronic kidney disease: Secondary | ICD-10-CM | POA: Diagnosis not present

## 2018-02-08 DIAGNOSIS — N2581 Secondary hyperparathyroidism of renal origin: Secondary | ICD-10-CM | POA: Diagnosis not present

## 2018-02-08 DIAGNOSIS — N186 End stage renal disease: Secondary | ICD-10-CM | POA: Diagnosis not present

## 2018-02-09 ENCOUNTER — Other Ambulatory Visit: Payer: Self-pay

## 2018-02-09 NOTE — Patient Outreach (Signed)
[place] [facility]) Care Management  02/08/2018  [name] 20-Jun-1991 [id number]   BSW went to meet with [name] at [facility] as requested.  When BSW arrived [name] was in the process of completing her dialysis treatment and was not feeling well.  BSW left resources with her and told her to expect a call on 02/09/18. Follow up call to [name] on 02/09/18.  BSW talked with [name] about all of the housing resources that were provided which included the following:  Sport and exercise psychologist information for [employer], [place], [employer], Solicitor, and [employer].  A list of low-income housing options in the area with contact information.  BSW encouraged [name] to first contact [employer]. BSW told [name] that an appointment can be scheduled at the [facility] office in order for [place] to call the resources provided but she declined at this time.  BSW provided [name] with a SCAT pass for 10 free rides.  BSW informed [name] that she does not have to have a permanent address to be eligible for SCAT and offered to complete/submit an application on her behalf.  [name] declined at this time.   BSW will follow up with her again next week to get an update on resources that have been contacted.   Ronn Melena, BSW Social Worker [phone]

## 2018-02-10 DIAGNOSIS — D631 Anemia in chronic kidney disease: Secondary | ICD-10-CM | POA: Diagnosis not present

## 2018-02-10 DIAGNOSIS — N186 End stage renal disease: Secondary | ICD-10-CM | POA: Diagnosis not present

## 2018-02-10 DIAGNOSIS — N2581 Secondary hyperparathyroidism of renal origin: Secondary | ICD-10-CM | POA: Diagnosis not present

## 2018-02-12 DIAGNOSIS — N2581 Secondary hyperparathyroidism of renal origin: Secondary | ICD-10-CM | POA: Diagnosis not present

## 2018-02-12 DIAGNOSIS — D631 Anemia in chronic kidney disease: Secondary | ICD-10-CM | POA: Diagnosis not present

## 2018-02-12 DIAGNOSIS — N186 End stage renal disease: Secondary | ICD-10-CM | POA: Diagnosis not present

## 2018-02-16 ENCOUNTER — Other Ambulatory Visit: Payer: Self-pay

## 2018-02-16 NOTE — Patient Outreach (Signed)
[place] [facility]) Care Management  02/16/2018  IRELYNN SCHERMERHORN [birth year] [id number]   BSW received call from Ms. Guzzo regarding housing resources that were given to her.  Ms. Meas reported that she called [employer] and the assistance that they can provide is sending a list of apartment options in the area which BSW has already provided.   BSW encouraged Ms. Jaquith to begin contacting these properties to determine how to go about applying.  BSW also encouraged her to contact [employer] to determine her status on the Section 8 wait list.  BSW received call from [place], Education officer, museum at [employer], regarding the assistance she has provided for Ms. Kilgallon.  She reported that she has been working with Ms. Pingley for approximately 90 days and has offered multiple resources.   She reported that she assisted her with completing the application for Section 8 housing.  Per Gabriel Cirri, she also arranged Medicaid transportation which would be available for all of Ms. Vernier's dialysis appointments.  Per Gabriel Cirri,  Ms. Mulvihill declined this transportation due to the time that they would have to pick her up.  Gabriel Cirri also reported that Ms. Matsen declined [employer]AT transportation services.   Ronn Melena, BSW Social Worker [phone]

## 2018-02-19 DIAGNOSIS — D631 Anemia in chronic kidney disease: Secondary | ICD-10-CM | POA: Diagnosis not present

## 2018-02-19 DIAGNOSIS — N186 End stage renal disease: Secondary | ICD-10-CM | POA: Diagnosis not present

## 2018-02-19 DIAGNOSIS — N2581 Secondary hyperparathyroidism of renal origin: Secondary | ICD-10-CM | POA: Diagnosis not present

## 2018-02-22 ENCOUNTER — Ambulatory Visit: Payer: Self-pay

## 2018-02-23 ENCOUNTER — Other Ambulatory Visit: Payer: Self-pay

## 2018-02-23 ENCOUNTER — Ambulatory Visit: Payer: Self-pay

## 2018-02-23 NOTE — Patient Outreach (Signed)
[place] [facility]) Care Management  02/23/2018  NEALY HICKMON 1991-04-18 [id number]   BSW is closing case at this time due to multiple housing resources being provided as well as transportation resources being provided and declined.    Ronn Melena, BSW Social Worker [phone]

## 2018-02-24 DIAGNOSIS — D631 Anemia in chronic kidney disease: Secondary | ICD-10-CM | POA: Diagnosis not present

## 2018-02-24 DIAGNOSIS — N2581 Secondary hyperparathyroidism of renal origin: Secondary | ICD-10-CM | POA: Diagnosis not present

## 2018-02-24 DIAGNOSIS — N186 End stage renal disease: Secondary | ICD-10-CM | POA: Diagnosis not present

## 2018-02-25 DIAGNOSIS — I120 Hypertensive chronic kidney disease with stage 5 chronic kidney disease or end stage renal disease: Secondary | ICD-10-CM | POA: Diagnosis not present

## 2018-02-25 DIAGNOSIS — N186 End stage renal disease: Secondary | ICD-10-CM | POA: Diagnosis not present

## 2018-02-25 DIAGNOSIS — Z992 Dependence on renal dialysis: Secondary | ICD-10-CM | POA: Diagnosis not present

## 2018-02-26 DIAGNOSIS — N2581 Secondary hyperparathyroidism of renal origin: Secondary | ICD-10-CM | POA: Diagnosis not present

## 2018-02-26 DIAGNOSIS — N186 End stage renal disease: Secondary | ICD-10-CM | POA: Diagnosis not present

## 2018-03-02 DIAGNOSIS — N2581 Secondary hyperparathyroidism of renal origin: Secondary | ICD-10-CM | POA: Diagnosis not present

## 2018-03-02 DIAGNOSIS — D631 Anemia in chronic kidney disease: Secondary | ICD-10-CM | POA: Diagnosis not present

## 2018-03-02 DIAGNOSIS — N390 Urinary tract infection, site not specified: Secondary | ICD-10-CM | POA: Diagnosis not present

## 2018-03-02 DIAGNOSIS — N186 End stage renal disease: Secondary | ICD-10-CM | POA: Diagnosis not present

## 2018-03-02 DIAGNOSIS — R300 Dysuria: Secondary | ICD-10-CM | POA: Diagnosis not present

## 2018-03-04 DIAGNOSIS — N186 End stage renal disease: Secondary | ICD-10-CM | POA: Diagnosis not present

## 2018-03-04 DIAGNOSIS — D631 Anemia in chronic kidney disease: Secondary | ICD-10-CM | POA: Diagnosis not present

## 2018-03-04 DIAGNOSIS — N390 Urinary tract infection, site not specified: Secondary | ICD-10-CM | POA: Diagnosis not present

## 2018-03-04 DIAGNOSIS — N2581 Secondary hyperparathyroidism of renal origin: Secondary | ICD-10-CM | POA: Diagnosis not present

## 2018-03-04 DIAGNOSIS — R300 Dysuria: Secondary | ICD-10-CM | POA: Diagnosis not present

## 2018-03-06 DIAGNOSIS — N390 Urinary tract infection, site not specified: Secondary | ICD-10-CM | POA: Diagnosis not present

## 2018-03-06 DIAGNOSIS — N2581 Secondary hyperparathyroidism of renal origin: Secondary | ICD-10-CM | POA: Diagnosis not present

## 2018-03-06 DIAGNOSIS — D631 Anemia in chronic kidney disease: Secondary | ICD-10-CM | POA: Diagnosis not present

## 2018-03-06 DIAGNOSIS — R300 Dysuria: Secondary | ICD-10-CM | POA: Diagnosis not present

## 2018-03-06 DIAGNOSIS — N186 End stage renal disease: Secondary | ICD-10-CM | POA: Diagnosis not present

## 2018-03-09 DIAGNOSIS — N2581 Secondary hyperparathyroidism of renal origin: Secondary | ICD-10-CM | POA: Diagnosis not present

## 2018-03-09 DIAGNOSIS — D631 Anemia in chronic kidney disease: Secondary | ICD-10-CM | POA: Diagnosis not present

## 2018-03-09 DIAGNOSIS — N186 End stage renal disease: Secondary | ICD-10-CM | POA: Diagnosis not present

## 2018-03-09 DIAGNOSIS — N390 Urinary tract infection, site not specified: Secondary | ICD-10-CM | POA: Diagnosis not present

## 2018-03-09 DIAGNOSIS — R300 Dysuria: Secondary | ICD-10-CM | POA: Diagnosis not present

## 2018-03-11 DIAGNOSIS — N390 Urinary tract infection, site not specified: Secondary | ICD-10-CM | POA: Diagnosis not present

## 2018-03-11 DIAGNOSIS — N186 End stage renal disease: Secondary | ICD-10-CM | POA: Diagnosis not present

## 2018-03-11 DIAGNOSIS — D631 Anemia in chronic kidney disease: Secondary | ICD-10-CM | POA: Diagnosis not present

## 2018-03-11 DIAGNOSIS — N2581 Secondary hyperparathyroidism of renal origin: Secondary | ICD-10-CM | POA: Diagnosis not present

## 2018-03-11 DIAGNOSIS — R300 Dysuria: Secondary | ICD-10-CM | POA: Diagnosis not present

## 2018-03-13 DIAGNOSIS — R300 Dysuria: Secondary | ICD-10-CM | POA: Diagnosis not present

## 2018-03-13 DIAGNOSIS — N390 Urinary tract infection, site not specified: Secondary | ICD-10-CM | POA: Diagnosis not present

## 2018-03-13 DIAGNOSIS — D631 Anemia in chronic kidney disease: Secondary | ICD-10-CM | POA: Diagnosis not present

## 2018-03-13 DIAGNOSIS — N2581 Secondary hyperparathyroidism of renal origin: Secondary | ICD-10-CM | POA: Diagnosis not present

## 2018-03-13 DIAGNOSIS — N186 End stage renal disease: Secondary | ICD-10-CM | POA: Diagnosis not present

## 2018-03-16 DIAGNOSIS — N2581 Secondary hyperparathyroidism of renal origin: Secondary | ICD-10-CM | POA: Diagnosis not present

## 2018-03-16 DIAGNOSIS — N390 Urinary tract infection, site not specified: Secondary | ICD-10-CM | POA: Diagnosis not present

## 2018-03-16 DIAGNOSIS — D631 Anemia in chronic kidney disease: Secondary | ICD-10-CM | POA: Diagnosis not present

## 2018-03-16 DIAGNOSIS — N186 End stage renal disease: Secondary | ICD-10-CM | POA: Diagnosis not present

## 2018-03-16 DIAGNOSIS — R300 Dysuria: Secondary | ICD-10-CM | POA: Diagnosis not present

## 2018-03-18 DIAGNOSIS — D631 Anemia in chronic kidney disease: Secondary | ICD-10-CM | POA: Diagnosis not present

## 2018-03-18 DIAGNOSIS — R300 Dysuria: Secondary | ICD-10-CM | POA: Diagnosis not present

## 2018-03-18 DIAGNOSIS — N390 Urinary tract infection, site not specified: Secondary | ICD-10-CM | POA: Diagnosis not present

## 2018-03-18 DIAGNOSIS — N186 End stage renal disease: Secondary | ICD-10-CM | POA: Diagnosis not present

## 2018-03-18 DIAGNOSIS — N2581 Secondary hyperparathyroidism of renal origin: Secondary | ICD-10-CM | POA: Diagnosis not present

## 2018-03-20 DIAGNOSIS — N2581 Secondary hyperparathyroidism of renal origin: Secondary | ICD-10-CM | POA: Diagnosis not present

## 2018-03-20 DIAGNOSIS — R300 Dysuria: Secondary | ICD-10-CM | POA: Diagnosis not present

## 2018-03-20 DIAGNOSIS — D631 Anemia in chronic kidney disease: Secondary | ICD-10-CM | POA: Diagnosis not present

## 2018-03-20 DIAGNOSIS — N390 Urinary tract infection, site not specified: Secondary | ICD-10-CM | POA: Diagnosis not present

## 2018-03-20 DIAGNOSIS — N186 End stage renal disease: Secondary | ICD-10-CM | POA: Diagnosis not present

## 2018-03-22 DIAGNOSIS — R21 Rash and other nonspecific skin eruption: Secondary | ICD-10-CM | POA: Diagnosis not present

## 2018-03-23 DIAGNOSIS — N186 End stage renal disease: Secondary | ICD-10-CM | POA: Diagnosis not present

## 2018-03-23 DIAGNOSIS — N390 Urinary tract infection, site not specified: Secondary | ICD-10-CM | POA: Diagnosis not present

## 2018-03-23 DIAGNOSIS — N2581 Secondary hyperparathyroidism of renal origin: Secondary | ICD-10-CM | POA: Diagnosis not present

## 2018-03-23 DIAGNOSIS — R300 Dysuria: Secondary | ICD-10-CM | POA: Diagnosis not present

## 2018-03-23 DIAGNOSIS — D631 Anemia in chronic kidney disease: Secondary | ICD-10-CM | POA: Diagnosis not present

## 2018-03-25 DIAGNOSIS — N2581 Secondary hyperparathyroidism of renal origin: Secondary | ICD-10-CM | POA: Diagnosis not present

## 2018-03-25 DIAGNOSIS — D631 Anemia in chronic kidney disease: Secondary | ICD-10-CM | POA: Diagnosis not present

## 2018-03-25 DIAGNOSIS — N186 End stage renal disease: Secondary | ICD-10-CM | POA: Diagnosis not present

## 2018-03-25 DIAGNOSIS — N390 Urinary tract infection, site not specified: Secondary | ICD-10-CM | POA: Diagnosis not present

## 2018-03-25 DIAGNOSIS — R300 Dysuria: Secondary | ICD-10-CM | POA: Diagnosis not present

## 2018-03-27 DIAGNOSIS — N186 End stage renal disease: Secondary | ICD-10-CM | POA: Diagnosis not present

## 2018-03-27 DIAGNOSIS — N2581 Secondary hyperparathyroidism of renal origin: Secondary | ICD-10-CM | POA: Diagnosis not present

## 2018-03-27 DIAGNOSIS — N390 Urinary tract infection, site not specified: Secondary | ICD-10-CM | POA: Diagnosis not present

## 2018-03-27 DIAGNOSIS — R300 Dysuria: Secondary | ICD-10-CM | POA: Diagnosis not present

## 2018-03-27 DIAGNOSIS — D631 Anemia in chronic kidney disease: Secondary | ICD-10-CM | POA: Diagnosis not present

## 2018-03-28 DIAGNOSIS — Z992 Dependence on renal dialysis: Secondary | ICD-10-CM | POA: Diagnosis not present

## 2018-03-28 DIAGNOSIS — I120 Hypertensive chronic kidney disease with stage 5 chronic kidney disease or end stage renal disease: Secondary | ICD-10-CM | POA: Diagnosis not present

## 2018-03-28 DIAGNOSIS — N186 End stage renal disease: Secondary | ICD-10-CM | POA: Diagnosis not present

## 2018-03-30 DIAGNOSIS — N2581 Secondary hyperparathyroidism of renal origin: Secondary | ICD-10-CM | POA: Diagnosis not present

## 2018-03-30 DIAGNOSIS — N186 End stage renal disease: Secondary | ICD-10-CM | POA: Diagnosis not present

## 2018-04-01 DIAGNOSIS — N2581 Secondary hyperparathyroidism of renal origin: Secondary | ICD-10-CM | POA: Diagnosis not present

## 2018-04-01 DIAGNOSIS — N186 End stage renal disease: Secondary | ICD-10-CM | POA: Diagnosis not present

## 2018-04-03 DIAGNOSIS — N186 End stage renal disease: Secondary | ICD-10-CM | POA: Diagnosis not present

## 2018-04-03 DIAGNOSIS — N2581 Secondary hyperparathyroidism of renal origin: Secondary | ICD-10-CM | POA: Diagnosis not present

## 2018-04-06 DIAGNOSIS — N2581 Secondary hyperparathyroidism of renal origin: Secondary | ICD-10-CM | POA: Diagnosis not present

## 2018-04-06 DIAGNOSIS — N186 End stage renal disease: Secondary | ICD-10-CM | POA: Diagnosis not present

## 2018-04-08 DIAGNOSIS — N2581 Secondary hyperparathyroidism of renal origin: Secondary | ICD-10-CM | POA: Diagnosis not present

## 2018-04-08 DIAGNOSIS — N186 End stage renal disease: Secondary | ICD-10-CM | POA: Diagnosis not present

## 2018-04-10 DIAGNOSIS — N2581 Secondary hyperparathyroidism of renal origin: Secondary | ICD-10-CM | POA: Diagnosis not present

## 2018-04-10 DIAGNOSIS — N186 End stage renal disease: Secondary | ICD-10-CM | POA: Diagnosis not present

## 2018-04-13 DIAGNOSIS — N186 End stage renal disease: Secondary | ICD-10-CM | POA: Diagnosis not present

## 2018-04-13 DIAGNOSIS — N2581 Secondary hyperparathyroidism of renal origin: Secondary | ICD-10-CM | POA: Diagnosis not present

## 2018-04-15 DIAGNOSIS — N2581 Secondary hyperparathyroidism of renal origin: Secondary | ICD-10-CM | POA: Diagnosis not present

## 2018-04-15 DIAGNOSIS — N186 End stage renal disease: Secondary | ICD-10-CM | POA: Diagnosis not present

## 2018-04-17 DIAGNOSIS — N186 End stage renal disease: Secondary | ICD-10-CM | POA: Diagnosis not present

## 2018-04-17 DIAGNOSIS — N2581 Secondary hyperparathyroidism of renal origin: Secondary | ICD-10-CM | POA: Diagnosis not present

## 2018-04-20 DIAGNOSIS — N186 End stage renal disease: Secondary | ICD-10-CM | POA: Diagnosis not present

## 2018-04-20 DIAGNOSIS — N2581 Secondary hyperparathyroidism of renal origin: Secondary | ICD-10-CM | POA: Diagnosis not present

## 2018-04-22 DIAGNOSIS — N2581 Secondary hyperparathyroidism of renal origin: Secondary | ICD-10-CM | POA: Diagnosis not present

## 2018-04-22 DIAGNOSIS — N186 End stage renal disease: Secondary | ICD-10-CM | POA: Diagnosis not present

## 2018-04-23 DIAGNOSIS — R21 Rash and other nonspecific skin eruption: Secondary | ICD-10-CM | POA: Diagnosis not present

## 2018-04-24 DIAGNOSIS — N2581 Secondary hyperparathyroidism of renal origin: Secondary | ICD-10-CM | POA: Diagnosis not present

## 2018-04-24 DIAGNOSIS — N186 End stage renal disease: Secondary | ICD-10-CM | POA: Diagnosis not present

## 2018-04-27 DIAGNOSIS — N186 End stage renal disease: Secondary | ICD-10-CM | POA: Diagnosis not present

## 2018-04-27 DIAGNOSIS — I120 Hypertensive chronic kidney disease with stage 5 chronic kidney disease or end stage renal disease: Secondary | ICD-10-CM | POA: Diagnosis not present

## 2018-04-27 DIAGNOSIS — N2581 Secondary hyperparathyroidism of renal origin: Secondary | ICD-10-CM | POA: Diagnosis not present

## 2018-04-27 DIAGNOSIS — D631 Anemia in chronic kidney disease: Secondary | ICD-10-CM | POA: Diagnosis not present

## 2018-04-27 DIAGNOSIS — Z992 Dependence on renal dialysis: Secondary | ICD-10-CM | POA: Diagnosis not present

## 2018-04-29 DIAGNOSIS — D631 Anemia in chronic kidney disease: Secondary | ICD-10-CM | POA: Diagnosis not present

## 2018-04-29 DIAGNOSIS — N186 End stage renal disease: Secondary | ICD-10-CM | POA: Diagnosis not present

## 2018-04-29 DIAGNOSIS — N2581 Secondary hyperparathyroidism of renal origin: Secondary | ICD-10-CM | POA: Diagnosis not present

## 2018-05-01 DIAGNOSIS — D631 Anemia in chronic kidney disease: Secondary | ICD-10-CM | POA: Diagnosis not present

## 2018-05-01 DIAGNOSIS — N2581 Secondary hyperparathyroidism of renal origin: Secondary | ICD-10-CM | POA: Diagnosis not present

## 2018-05-01 DIAGNOSIS — N186 End stage renal disease: Secondary | ICD-10-CM | POA: Diagnosis not present

## 2018-05-04 DIAGNOSIS — D631 Anemia in chronic kidney disease: Secondary | ICD-10-CM | POA: Diagnosis not present

## 2018-05-04 DIAGNOSIS — N2581 Secondary hyperparathyroidism of renal origin: Secondary | ICD-10-CM | POA: Diagnosis not present

## 2018-05-04 DIAGNOSIS — N186 End stage renal disease: Secondary | ICD-10-CM | POA: Diagnosis not present

## 2018-05-06 DIAGNOSIS — D631 Anemia in chronic kidney disease: Secondary | ICD-10-CM | POA: Diagnosis not present

## 2018-05-06 DIAGNOSIS — N2581 Secondary hyperparathyroidism of renal origin: Secondary | ICD-10-CM | POA: Diagnosis not present

## 2018-05-06 DIAGNOSIS — N186 End stage renal disease: Secondary | ICD-10-CM | POA: Diagnosis not present

## 2018-05-07 ENCOUNTER — Emergency Department (HOSPITAL_COMMUNITY): Payer: Medicare Other

## 2018-05-07 ENCOUNTER — Other Ambulatory Visit: Payer: Self-pay

## 2018-05-07 ENCOUNTER — Encounter (HOSPITAL_COMMUNITY): Payer: Self-pay

## 2018-05-07 ENCOUNTER — Observation Stay (HOSPITAL_COMMUNITY)
Admission: EM | Admit: 2018-05-07 | Discharge: 2018-05-09 | Disposition: A | Discharge status: Home / Self Care | Payer: Medicare Other | Attending: Internal Medicine | Admitting: Internal Medicine

## 2018-05-07 DIAGNOSIS — R8281 Pyuria: Secondary | ICD-10-CM | POA: Insufficient documentation

## 2018-05-07 DIAGNOSIS — N12 Tubulo-interstitial nephritis, not specified as acute or chronic: Secondary | ICD-10-CM | POA: Diagnosis not present

## 2018-05-07 DIAGNOSIS — R112 Nausea with vomiting, unspecified: Principal | ICD-10-CM | POA: Diagnosis present

## 2018-05-07 DIAGNOSIS — S3991XA Unspecified injury of abdomen, initial encounter: Secondary | ICD-10-CM | POA: Diagnosis not present

## 2018-05-07 DIAGNOSIS — I10 Essential (primary) hypertension: Secondary | ICD-10-CM | POA: Diagnosis not present

## 2018-05-07 DIAGNOSIS — Z91010 Allergy to peanuts: Secondary | ICD-10-CM | POA: Diagnosis not present

## 2018-05-07 DIAGNOSIS — W050XXA Fall from non-moving wheelchair, initial encounter: Secondary | ICD-10-CM | POA: Diagnosis not present

## 2018-05-07 DIAGNOSIS — Z79899 Other long term (current) drug therapy: Secondary | ICD-10-CM | POA: Insufficient documentation

## 2018-05-07 DIAGNOSIS — S0990XA Unspecified injury of head, initial encounter: Secondary | ICD-10-CM | POA: Diagnosis not present

## 2018-05-07 DIAGNOSIS — S3993XA Unspecified injury of pelvis, initial encounter: Secondary | ICD-10-CM | POA: Diagnosis not present

## 2018-05-07 DIAGNOSIS — K5900 Constipation, unspecified: Secondary | ICD-10-CM | POA: Diagnosis present

## 2018-05-07 DIAGNOSIS — Q7649 Other congenital malformations of spine, not associated with scoliosis: Secondary | ICD-10-CM | POA: Diagnosis not present

## 2018-05-07 DIAGNOSIS — N186 End stage renal disease: Secondary | ICD-10-CM | POA: Insufficient documentation

## 2018-05-07 DIAGNOSIS — N390 Urinary tract infection, site not specified: Secondary | ICD-10-CM | POA: Diagnosis not present

## 2018-05-07 DIAGNOSIS — D649 Anemia, unspecified: Secondary | ICD-10-CM | POA: Diagnosis present

## 2018-05-07 DIAGNOSIS — I120 Hypertensive chronic kidney disease with stage 5 chronic kidney disease or end stage renal disease: Secondary | ICD-10-CM | POA: Diagnosis not present

## 2018-05-07 DIAGNOSIS — K5909 Other constipation: Secondary | ICD-10-CM

## 2018-05-07 DIAGNOSIS — E876 Hypokalemia: Secondary | ICD-10-CM | POA: Diagnosis not present

## 2018-05-07 DIAGNOSIS — R51 Headache: Secondary | ICD-10-CM | POA: Insufficient documentation

## 2018-05-07 DIAGNOSIS — Z91040 Latex allergy status: Secondary | ICD-10-CM | POA: Diagnosis not present

## 2018-05-07 DIAGNOSIS — Z992 Dependence on renal dialysis: Secondary | ICD-10-CM | POA: Diagnosis not present

## 2018-05-07 DIAGNOSIS — D638 Anemia in other chronic diseases classified elsewhere: Secondary | ICD-10-CM | POA: Diagnosis present

## 2018-05-07 DIAGNOSIS — K219 Gastro-esophageal reflux disease without esophagitis: Secondary | ICD-10-CM | POA: Diagnosis present

## 2018-05-07 DIAGNOSIS — Q059 Spina bifida, unspecified: Secondary | ICD-10-CM

## 2018-05-07 DIAGNOSIS — D631 Anemia in chronic kidney disease: Secondary | ICD-10-CM | POA: Diagnosis not present

## 2018-05-07 DIAGNOSIS — Z720 Tobacco use: Secondary | ICD-10-CM | POA: Diagnosis present

## 2018-05-07 LAB — URINALYSIS, ROUTINE W REFLEX MICROSCOPIC
Bilirubin Urine: NEGATIVE
GLUCOSE, UA: NEGATIVE mg/dL
KETONES UR: NEGATIVE mg/dL
Nitrite: NEGATIVE
PROTEIN: 100 mg/dL — AB
Specific Gravity, Urine: 1.008 (ref 1.005–1.030)
pH: 9 — ABNORMAL HIGH (ref 5.0–8.0)

## 2018-05-07 LAB — COMPREHENSIVE METABOLIC PANEL
ALBUMIN: 3.9 g/dL (ref 3.5–5.0)
ALK PHOS: 46 U/L (ref 38–126)
ALT: 10 U/L (ref 0–44)
ANION GAP: 14 (ref 5–15)
AST: 14 U/L — ABNORMAL LOW (ref 15–41)
BUN: 33 mg/dL — ABNORMAL HIGH (ref 6–20)
CHLORIDE: 98 mmol/L (ref 98–111)
CO2: 28 mmol/L (ref 22–32)
Calcium: 10 mg/dL (ref 8.9–10.3)
Creatinine, Ser: 5.09 mg/dL — ABNORMAL HIGH (ref 0.44–1.00)
GFR calc Af Amer: 13 mL/min — ABNORMAL LOW (ref >60)
GFR calc non Af Amer: 11 mL/min — ABNORMAL LOW (ref >60)
GLUCOSE: 104 mg/dL — AB (ref 70–99)
Potassium: 3.4 mmol/L — ABNORMAL LOW (ref 3.5–5.1)
SODIUM: 140 mmol/L (ref 135–145)
Total Bilirubin: 0.6 mg/dL (ref 0.3–1.2)
Total Protein: 8.7 g/dL — ABNORMAL HIGH (ref 6.5–8.1)

## 2018-05-07 LAB — I-STAT BETA HCG BLOOD, ED (MC, WL, AP ONLY)

## 2018-05-07 LAB — CBC WITH DIFFERENTIAL/PLATELET
ABS IMMATURE GRANULOCYTES: 0.05 10*3/uL (ref 0.00–0.07)
BASOS ABS: 0 10*3/uL (ref 0.0–0.1)
BASOS PCT: 0 %
Eosinophils Absolute: 0.2 10*3/uL (ref 0.0–0.5)
Eosinophils Relative: 2 %
HCT: 35.2 % — ABNORMAL LOW (ref 36.0–46.0)
Hemoglobin: 10.6 g/dL — ABNORMAL LOW (ref 12.0–15.0)
Immature Granulocytes: 0 %
LYMPHS PCT: 11 %
Lymphs Abs: 1.4 10*3/uL (ref 0.7–4.0)
MCH: 27.1 pg (ref 26.0–34.0)
MCHC: 30.1 g/dL (ref 30.0–36.0)
MCV: 90 fL (ref 80.0–100.0)
Monocytes Absolute: 0.8 10*3/uL (ref 0.1–1.0)
Monocytes Relative: 7 %
NEUTROS ABS: 10.2 10*3/uL — AB (ref 1.7–7.7)
NRBC: 0 % (ref 0.0–0.2)
Neutrophils Relative %: 80 %
PLATELETS: 268 10*3/uL (ref 150–400)
RBC: 3.91 MIL/uL (ref 3.87–5.11)
RDW: 15.1 % (ref 11.5–15.5)
WBC: 12.7 10*3/uL — ABNORMAL HIGH (ref 4.0–10.5)

## 2018-05-07 LAB — LIPASE, BLOOD: Lipase: 49 U/L (ref 11–51)

## 2018-05-07 LAB — PREGNANCY, URINE: PREG TEST UR: NEGATIVE

## 2018-05-07 MED ORDER — ACETAMINOPHEN 325 MG PO TABS
650.0000 mg | ORAL_TABLET | Freq: Four times a day (QID) | ORAL | Status: DC | PRN
  Administered 2018-05-08 – 2018-05-09 (×3): 650 mg via ORAL
  Filled 2018-05-07 (×4): qty 2

## 2018-05-07 MED ORDER — PIPERACILLIN-TAZOBACTAM 3.375 G IVPB
3.3750 g | Freq: Once | INTRAVENOUS | Status: AC
  Administered 2018-05-07: 3.375 g via INTRAVENOUS
  Filled 2018-05-07: qty 50

## 2018-05-07 MED ORDER — FAMOTIDINE IN NACL 20-0.9 MG/50ML-% IV SOLN
20.0000 mg | INTRAVENOUS | Status: DC
  Administered 2018-05-07: 20 mg via INTRAVENOUS
  Filled 2018-05-07 (×3): qty 50

## 2018-05-07 MED ORDER — PIPERACILLIN-TAZOBACTAM 3.375 G IVPB
3.3750 g | Freq: Two times a day (BID) | INTRAVENOUS | Status: DC
  Administered 2018-05-07 – 2018-05-09 (×4): 3.375 g via INTRAVENOUS
  Filled 2018-05-07 (×4): qty 50

## 2018-05-07 MED ORDER — RENA-VITE PO TABS
1.0000 | ORAL_TABLET | Freq: Every day | ORAL | Status: DC
  Administered 2018-05-07 – 2018-05-08 (×2): 1 via ORAL
  Filled 2018-05-07 (×3): qty 1

## 2018-05-07 MED ORDER — PROCHLORPERAZINE EDISYLATE 10 MG/2ML IJ SOLN
5.0000 mg | Freq: Four times a day (QID) | INTRAMUSCULAR | Status: DC | PRN
  Administered 2018-05-07: 5 mg via INTRAVENOUS
  Filled 2018-05-07: qty 2

## 2018-05-07 MED ORDER — ONDANSETRON HCL 4 MG/2ML IJ SOLN
4.0000 mg | Freq: Four times a day (QID) | INTRAMUSCULAR | Status: DC | PRN
  Filled 2018-05-07: qty 2

## 2018-05-07 MED ORDER — ACETAMINOPHEN 650 MG RE SUPP: 650.0000 mg | Freq: Four times a day (QID) | RECTAL | Status: DC | PRN

## 2018-05-07 MED ORDER — POLYETHYLENE GLYCOL 3350 17 G PO PACK
17.0000 g | PACK | Freq: Every day | ORAL | Status: DC
  Administered 2018-05-07 – 2018-05-08 (×2): 17 g via ORAL
  Filled 2018-05-07 (×3): qty 1

## 2018-05-07 MED ORDER — PIPERACILLIN-TAZOBACTAM 3.375 G IVPB: 3.3750 g | Freq: Two times a day (BID) | INTRAVENOUS | Status: DC

## 2018-05-07 MED ORDER — PROMETHAZINE HCL 25 MG/ML IJ SOLN
12.5000 mg | Freq: Three times a day (TID) | INTRAMUSCULAR | Status: DC | PRN
  Administered 2018-05-07: 12.5 mg via INTRAVENOUS
  Filled 2018-05-07: qty 1

## 2018-05-07 MED ORDER — PIPERACILLIN-TAZOBACTAM IN DEX 2-0.25 GM/50ML IV SOLN
2.2500 g | Freq: Three times a day (TID) | INTRAVENOUS | Status: DC
  Filled 2018-05-07 (×10): qty 50

## 2018-05-07 MED ORDER — PANTOPRAZOLE SODIUM 40 MG PO TBEC
40.0000 mg | DELAYED_RELEASE_TABLET | Freq: Every day | ORAL | Status: DC
  Administered 2018-05-07 – 2018-05-08 (×2): 40 mg via ORAL
  Filled 2018-05-07 (×3): qty 1

## 2018-05-07 MED ORDER — FLEET ENEMA 7-19 GM/118ML RE ENEM
1.0000 | ENEMA | Freq: Once | RECTAL | Status: AC
  Administered 2018-05-07: 1 via RECTAL

## 2018-05-07 MED ORDER — ONDANSETRON HCL 4 MG/2ML IJ SOLN
INTRAMUSCULAR | Status: AC
  Filled 2018-05-07: qty 2

## 2018-05-07 MED ORDER — HEPARIN SODIUM (PORCINE) 5000 UNIT/ML IJ SOLN
5000.0000 [IU] | Freq: Three times a day (TID) | INTRAMUSCULAR | Status: DC
  Filled 2018-05-07 (×3): qty 1

## 2018-05-07 MED ORDER — SODIUM CHLORIDE 0.9 % IV SOLN
INTRAVENOUS | Status: DC | PRN
  Administered 2018-05-07: 1000 mL via INTRAVENOUS

## 2018-05-07 MED ORDER — IOPAMIDOL (ISOVUE-300) INJECTION 61%
INTRAVENOUS | Status: AC
  Filled 2018-05-07: qty 50

## 2018-05-07 MED ORDER — ONDANSETRON HCL 4 MG PO TABS
4.0000 mg | ORAL_TABLET | Freq: Four times a day (QID) | ORAL | Status: DC | PRN
  Administered 2018-05-08: 4 mg via ORAL
  Filled 2018-05-07: qty 1

## 2018-05-07 MED ORDER — ONDANSETRON HCL 4 MG/2ML IJ SOLN
4.0000 mg | Freq: Once | INTRAMUSCULAR | Status: AC
  Administered 2018-05-07: 4 mg via INTRAVENOUS

## 2018-05-07 MED ORDER — SENNA 8.6 MG PO TABS
2.0000 | ORAL_TABLET | Freq: Every day | ORAL | Status: DC
  Administered 2018-05-07 – 2018-05-08 (×2): 17.2 mg via ORAL
  Filled 2018-05-07 (×3): qty 2

## 2018-05-07 NOTE — Care Management Obs Status (Signed)
[place] NOTIFICATION   Patient Details  Name: MIRYAH RALLS MRN: [id number] Date of Birth: 09-19-1990   Medicare Observation Status Notification Given:  Yes    Shelda Altes 05/07/2018, 2:39 PM

## 2018-05-07 NOTE — Consult Note (Signed)
Reason for Consult: End-stage renal disease Referring Physician: Dr. Rhett Bannister is an 27 y.o. female.  HPI: She is a patient who has history of hypertension, spinal bifida, end-stage renal disease on maintenance hemodialysis presently came with complaints of headache after falling down.  Patient also had some nausea and coffee-ground vomitus.  Presently she states that she is feeling much better.  She has one episode after she came to the hospital.  She has some abdominal pain.  Presently denies any difficulty breathing.  Denies also any fever, chills or sweating.  Past Medical History:  Diagnosis Date  . Anemia associated with chronic renal failure   . Blood transfusion   . 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  . Depression 05/14/2015  . Dialysis care   . ESRD (end stage renal disease) on dialysis ([place])   . GERD (gastroesophageal reflux disease) 01/07/2017  . HTN (hypertension) 05/02/2011  . Spina bifida   . UTI (lower urinary tract infection)     Past Surgical History:  Procedure Laterality Date  . AV FISTULA PLACEMENT     left arm    Family History  Problem Relation Age of Onset  . Kidney cancer Other   . Hypertension Maternal Grandmother   . Arthritis Maternal Grandmother   . Breast cancer Maternal Aunt   . Colon cancer Neg Hx     Social History:  reports that she has quit smoking. Her smoking use included cigarettes. She smoked 0.10 packs per day. She has never used smokeless tobacco. She reports that she does not drink alcohol or use drugs.  Allergies:  Allergies  Allergen Reactions  . Ciprofloxacin Shortness Of Breath, Nausea And Vomiting and Other (See Comments)    HIGH FEVER and oral blisters   . Other Anaphylaxis    Revaclear dialzer  . Peanut-Containing Drug Products Anaphylaxis  . Aleve [Naproxen Sodium] Other (See Comments)    G.I.Bleed  . Ceftriaxone Other (See Comments)    Blisters in mouth   . Coconut Oil Hives  .  Influenza Vaccines Nausea And Vomiting and Other (See Comments)    High fever  . Tetanus Toxoid, Adsorbed Nausea And Vomiting and Other (See Comments)    HIGH FEVER, also  . Tetanus Toxoids Nausea And Vomiting and Other (See Comments)    HIGH FEVER  . Latex Itching and Rash    Medications: I have reviewed the patient's current medications.  Results for orders placed or performed during the hospital encounter of 05/07/18 (from the past 48 hour(s))  Urinalysis, Routine w reflex microscopic     Status: Abnormal   Collection Time: 05/07/18  1:57 AM  Result Value Ref Range   Color, Urine YELLOW YELLOW   APPearance TURBID (A) CLEAR   Specific Gravity, Urine 1.008 1.005 - 1.030   pH 9.0 (H) 5.0 - 8.0   Glucose, UA NEGATIVE NEGATIVE mg/dL   Hgb urine dipstick SMALL (A) NEGATIVE   Bilirubin Urine NEGATIVE NEGATIVE   Ketones, ur NEGATIVE NEGATIVE mg/dL   Protein, ur 100 (A) NEGATIVE mg/dL   Nitrite NEGATIVE NEGATIVE   Leukocytes, UA MODERATE (A) NEGATIVE   RBC / HPF >50 (H) 0 - 5 RBC/hpf   WBC, UA >50 (H) 0 - 5 WBC/hpf   Bacteria, UA RARE (A) NONE SEEN   WBC Clumps PRESENT     Comment: Performed at [facility], [address], [place], [place] [address]  Pregnancy, urine     Status: None  Collection Time: 05/07/18  1:57 AM  Result Value Ref Range   Preg Test, Ur NEGATIVE NEGATIVE    Comment:        THE SENSITIVITY OF THIS METHODOLOGY IS >20 mIU/mL. Performed at [facility], [address] [place], [place] [address]   CBC with Differential/Platelet     Status: Abnormal   Collection Time: 05/07/18  1:57 AM  Result Value Ref Range   WBC 12.7 (H) 4.0 - 10.5 K/uL   RBC 3.91 3.87 - 5.11 MIL/uL   Hemoglobin 10.6 (L) 12.0 - 15.0 g/dL   HCT 35.2 (L) 36.0 - 46.0 %   MCV 90.0 80.0 - 100.0 fL   MCH 27.1 26.0 - 34.0 pg   MCHC 30.1 30.0 - 36.0 g/dL   RDW 15.1 11.5 - 15.5 %   Platelets 268 150 - 400 K/uL   nRBC 0.0 0.0 - 0.2 %   Neutrophils Relative % 80 %   Neutro Abs 10.2 (H) 1.7 -  7.7 K/uL   Lymphocytes Relative 11 %   Lymphs Abs 1.4 0.7 - 4.0 K/uL   Monocytes Relative 7 %   Monocytes Absolute 0.8 0.1 - 1.0 K/uL   Eosinophils Relative 2 %   Eosinophils Absolute 0.2 0.0 - 0.5 K/uL   Basophils Relative 0 %   Basophils Absolute 0.0 0.0 - 0.1 K/uL   Immature Granulocytes 0 %   Abs Immature Granulocytes 0.05 0.00 - 0.07 K/uL    Comment: Performed at [facility], [address] [place] [address], [place], [place] [address]  Comprehensive metabolic panel     Status: Abnormal   Collection Time: 05/07/18  1:57 AM  Result Value Ref Range   Sodium 140 135 - 145 mmol/L   Potassium 3.4 (L) 3.5 - 5.1 mmol/L   Chloride 98 98 - 111 mmol/L   CO2 28 22 - 32 mmol/L   Glucose, Bld 104 (H) 70 - 99 mg/dL   BUN 33 (H) 6 - 20 mg/dL   Creatinine, Ser 5.09 (H) 0.44 - 1.00 mg/dL   Calcium 10.0 8.9 - 10.3 mg/dL   Total Protein 8.7 (H) 6.5 - 8.1 g/dL   Albumin 3.9 3.5 - 5.0 g/dL   AST 14 (L) 15 - 41 U/L   ALT 10 0 - 44 U/L   Alkaline Phosphatase 46 38 - 126 U/L   Total Bilirubin 0.6 0.3 - 1.2 mg/dL   GFR calc non Af Amer 11 (L) >60 mL/min   GFR calc Af Amer 13 (L) >60 mL/min    Comment: (NOTE) The eGFR has been calculated using the CKD EPI equation. This calculation has not been validated in all clinical situations. eGFR's persistently <60 mL/min signify possible Chronic Kidney Disease.    Anion gap 14 5 - 15    Comment: Performed at [facility], [address] [place], [place] [address]  Lipase, blood     Status: None   Collection Time: 05/07/18  1:57 AM  Result Value Ref Range   Lipase 49 11 - 51 U/L    Comment: Performed at [facility], [address], [place], [place] [address]  I-Stat Beta hCG blood, ED ([facility], [facility], [facility] only)     Status: None   Collection Time: 05/07/18  2:03 AM  Result Value Ref Range   I-stat hCG, quantitative <5.0 <5 mIU/mL   Comment 3            Comment:   GEST. AGE      CONC.  (mIU/mL)   <=  1 WEEK        5 - 50     2 WEEKS       50 - 500     3 WEEKS       100 -  10,000     4 WEEKS     1,000 - 30,000        FEMALE AND NON-PREGNANT FEMALE:     LESS THAN 5 mIU/mL     Ct Abdomen Pelvis Wo Contrast  Result Date: 05/07/2018 CLINICAL DATA:  Headache and vomiting since recent fall. EXAM: CT ABDOMEN AND PELVIS WITHOUT CONTRAST TECHNIQUE: Multidetector CT imaging of the abdomen and pelvis was performed following the standard protocol without IV contrast. COMPARISON:  None. FINDINGS: There is generalized anatomic distortion due to caudal regression syndrome and spina bifida. LOWER CHEST: There is no basilar pleural or apical pericardial effusion. HEPATOBILIARY: Unchanged distorted hepatic contours. No biliary dilatation. The gallbladder is normal. PANCREAS: Difficult to visualize the pancreas well, but no focal abnormality is identified. SPLEEN: Normal. ADRENALS/URINARY TRACT: --Adrenal glands: Not clearly visualized. --Horseshoe kidney: Unchanged chronic hydronephrosis. --Urinary bladder: The urinary bladder shows marked wall thickening, unchanged. STOMACH/BOWEL: Large amount of stool in the rectum proximal to the narrowed pelvic inlet. There is a large colonic stool burden in general. No dilated small bowel. VASCULAR/LYMPHATIC: Limited assessment of the vessels without contrast. No abdominal or pelvic lymphadenopathy. REPRODUCTIVE: Cystic focus in the superior aspect of the gluteal cleft is unchanged. Fat-containing mass in the left lower quadrant is unchanged, measuring 5.9 cm. MUSCULOSKELETAL. 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 There is a narrowed pelvic inlet secondary to medialization of iliac bones. OTHER: None. IMPRESSION: 1. Unchanged findings of caudal regression syndrome and spina bifida. 2. Large colonic stool burden, greatest just proximal to the narrowed pelvic inlet. 3. Chronic hydronephrosis of horseshoe kidney. 4. Unchanged appearance of left ovarian dermoid. 5. Chronic wall thickening of the urinary bladder. Electronically  Signed   By: Ulyses Jarred M.D.   On: 05/07/2018 03:56   Ct Head Wo Contrast  Result Date: 05/07/2018 CLINICAL DATA:  Fall with headache.  Vomiting. EXAM: CT HEAD WITHOUT CONTRAST TECHNIQUE: Contiguous axial images were obtained from the base of the skull through the vertex without intravenous contrast. COMPARISON:  Head CT 07/23/2016 FINDINGS: Brain: There is no mass, hemorrhage or extra-axial collection. The size and configuration of the ventricles and extra-axial CSF spaces are normal. There is no acute or chronic infarction. The brain parenchyma is normal. Vascular: No abnormal hyperdensity of the major intracranial arteries or dural venous sinuses. No intracranial atherosclerosis. Skull: The visualized skull base, calvarium and extracranial soft tissues are normal. Sinuses/Orbits: No fluid levels or advanced mucosal thickening of the visualized paranasal sinuses. No mastoid or middle ear effusion. The orbits are normal. IMPRESSION: Normal head CT. Electronically Signed   By: Ulyses Jarred M.D.   On: 05/07/2018 02:29    Review of Systems  Constitutional: Negative for chills and fever.  HENT:       Headache  Eyes: Negative for photophobia.  Respiratory: Negative for cough, shortness of breath and wheezing.   Cardiovascular: Negative for chest pain, orthopnea and PND.  Gastrointestinal: Positive for constipation, nausea and vomiting.       Coffee-ground vomitus  Genitourinary: Negative for dysuria and urgency.  Neurological: Positive for headaches. Negative for dizziness.   Blood pressure (!) 164/118, pulse 99, temperature 98.9 F (37.2 C), temperature source Oral, resp. rate 18, weight 28.5 kg,  SpO2 95 %. Physical Exam  Constitutional: She is oriented to person, place, and time. No distress.  Eyes: Left eye exhibits no discharge. No scleral icterus.  Neck: No JVD present.  Cardiovascular: Normal rate and regular rhythm.  Respiratory: No respiratory distress. She has no wheezes. She has  no rales.  GI: She exhibits no distension. There is no tenderness. There is no rebound.  Slight distention  Musculoskeletal: She exhibits deformity. She exhibits no edema.  Neurological: She is alert and oriented to person, place, and time.    Assessment/Plan: 1] end-stage renal disease: She is status post hemodialysis yesterday for 2 hours and 40 minutes.  Her potassium is normal.  As stated above patient came with history of nausea and vomiting.  Presently she is feeling better. 2] hypertension: Her blood pressure is reasonably controlled 3] anemia: Her hemoglobin is within our target goal 4] history of UTI 5] history of spinal bifida 6] history of nausea and coffee-ground vomitus.  Patient with history of GERD.  Her hemoglobin remained stable. Plan: 1] we will make arrangement for patient to get dialysis tomorrow which is her regular schedule. 2] we will dialyze her for 3 hours and hold heparin 3] we will check a renal panel and CBC in the morning.  Annaya Bangert S 05/07/2018, 8:49 AM

## 2018-05-07 NOTE — H&P (Signed)
History and Physical    [name] JJH:[id number] DOB: 1990/10/10 DOA: 05/07/2018  PCP: Lucianne Lei, MD   Patient coming from: Home.  I have personally briefly reviewed patient's old medical records in [place]  Chief Complaint: Headache, coffee-ground emesis.  HPI: [name] is a 27 y.o. 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  She has also been having nausea, emesis of coffee grounds and abdominal pain since Wednesday.  She says that she has had 5 episodes of emesis since then.  She has been constipated for several days.  She denies fever, chills, sore throat, dyspnea, hemoptysis or wheezing.  No chest pain, dizziness, diaphoresis, PND or orthopnea.  She states that she has had some occasional palpitations.  She denies dysuria, frequency or hematuria.  No heat or cold intolerance.  She denies polyuria, polydipsia, polyphagia or blurred vision.  No pruritus.  ED Course: Initial vital signs temperature 98.9 F, pulse 98, respirations 16, blood pressure 125/91 mmHg and O2 sat 100% on room air.  The patient received 3.375 g of Zosyn, 4 mg of Zofran IVP and 1 Fleet enema PR.  The patient has been wanting to go home, but accepted to stay in the hospital after Dr. Wyvonnia Dusky explained to her why she needed to stay.   Her urinalysis was stored in appearance, pH is 9.0, had a small hemoglobinuria, proteinuria 100 mg/dL, moderate leukocyte esterase, more than 50 RBC, more than 50 WBC and rare bacteria microscopic.  UC&S was sent to the microbiology lab.  Urine pregnancy test was negative.  Her CBC demonstrated a white count is 12.7 with 80% neutrophils, 11% lymphocytes and 7% monocytes.  Hemoglobin was 10.6 g/dL and platelets  268.  CMP shows normal electrolytes, except for potassium level 3.4 mmol/L.  BUN was 33, creatinine 5.09 and glucose 104 mg/dL.  Total protein was 8.7 and AST was decreased at 14 units/L.  The rest of the liver function tests are within normal limits.  Imaging: CT head was normal.  CT abdomen/pelvis without contrast showed unchanged findings of caudal regression syndrome and spina bifida.  There was a large colonic stool burden, chronic hydronephrosis of horseshoe kidney, unchanged appearance of left ovarian dermoid.  Chronic wall thickening of the urinary bladder.  Please see images and full radiology report for further detail  Review of Systems: As per HPI otherwise 10 point review of systems negative.   Past Medical History:  Diagnosis Date  . Anemia associated with chronic renal failure   . Blood transfusion   . 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  . Depression 05/14/2015  . Dialysis care   . ESRD (end stage renal disease) on dialysis ([place])   . GERD (gastroesophageal reflux disease) 01/07/2017  . HTN (hypertension) 05/02/2011  . Spina bifida   . UTI (lower urinary tract infection)     Past Surgical History:  Procedure Laterality Date  . AV FISTULA PLACEMENT     left arm     reports that she has been smoking cigarettes. She has been smoking about 0.10 packs per day. She has never used smokeless tobacco. She reports that she does not drink alcohol or use drugs.  Allergies  Allergen Reactions  .  Ciprofloxacin Shortness Of Breath, Nausea And Vomiting and Other (See Comments)    HIGH FEVER and oral blisters   . Other Anaphylaxis    Revaclear dialzer  . Peanut-Containing Drug Products Anaphylaxis  . Aleve [Naproxen Sodium] Other (See Comments)    G.I.Bleed  . Ceftriaxone Other (See Comments)    Blisters in mouth   . Coconut Oil Hives  . Influenza Vaccines Nausea And Vomiting and Other (See Comments)    High fever  . Tetanus Toxoid, Adsorbed Nausea And  Vomiting and Other (See Comments)    HIGH FEVER, also  . Tetanus Toxoids Nausea And Vomiting and Other (See Comments)    HIGH FEVER  . Latex Itching and Rash    Family History  Problem Relation Age of Onset  . Kidney cancer Other   . Hypertension Maternal Grandmother   . Arthritis Maternal Grandmother   . Breast cancer Maternal Aunt   . Colon cancer Neg Hx    Prior to Admission medications   Medication Sig Start Date End Date Taking? Authorizing Provider  doxycycline (VIBRAMYCIN) 100 MG capsule Take 1 capsule (100 mg total) by mouth 2 (two) times daily. One po bid x 7 days Patient not taking: Reported on 01/02/2018 02/01/17   Ripley Fraise, MD  Epoetin Alfa (EPOGEN IJ) Inject as directed every Monday, Wednesday, and Friday with hemodialysis.     [provider]  HYDROcodone-acetaminophen (NORCO/VICODIN) 5-325 MG tablet Take 1 tablet by mouth every 6 (six) hours as needed. Patient not taking: Reported on 01/02/2018 02/01/17   Ripley Fraise, MD  hydrocortisone (ANUSOL-HC) 25 MG suppository Place 1 suppository (25 mg total) rectally daily. For 7 days 01/03/18   Muthersbaugh, Jarrett Soho, PA-C  linaclotide [facility]) 72 MCG capsule Take 1 capsule (72 mcg total) by mouth daily before breakfast. Patient not taking: Reported on 01/02/2018 01/07/17   Carlis Stable, NP  multivitamin (RENA-VIT) TABS tablet Take 1 tablet by mouth every Monday, Wednesday, and Friday with hemodialysis.     [provider]  omeprazole (PRILOSEC) 20 MG capsule Take 20 mg by mouth 2 (two) times daily before a meal.  03/26/16   [provider]  ondansetron (ZOFRAN) 4 MG tablet Take 1 tablet (4 mg total) by mouth every 8 (eight) hours as needed for nausea or vomiting. Patient not taking: Reported on 01/02/2018 01/07/17   Carlis Stable, NP  pantoprazole (PROTONIX) 40 MG tablet Take 1 tablet (40 mg total) by mouth 2 (two) times daily before a meal. Patient not taking: Reported on 01/02/2018 01/07/17   Carlis Stable, NP   sevelamer carbonate (RENVELA) 800 MG tablet Take 2,400 mg by mouth See admin instructions. Take 2,400 mg by mouth one to three times a day, depending on number of meals consumed daily    [provider]    Physical Exam: Vitals:   05/07/18 0112 05/07/18 0200  BP: (!) 125/91 124/71  Pulse: 98   Resp: 16   Temp: 98.9 F (37.2 C)   TempSrc: Oral   SpO2: 100%     Constitutional: Looks chronically ill.  NAD, calm, comfortable Eyes: PERRL, lids and conjunctivae look mildly pale. ENMT: Mucous membranes are moist. Posterior pharynx clear of any exudate or lesions. Neck: normal, supple, no masses, no thyromegaly Respiratory: clear to auscultation bilaterally, no wheezing, no crackles. Normal respiratory effort. No accessory muscle use.  Cardiovascular: Regular rate and rhythm, no murmurs / rubs / gallops. No extremity edema. 2+ pedal pulses. No carotid bruits.  LUE  AV fistula with good thrill. Abdomen: Soft, mild epigastric and LLQ tenderness, no guarding/rebound/masses palpated. No hepatosplenomegaly. Bowel sounds positive.  Musculoskeletal: no clubbing / cyanosis.  Mid to lower spine shows protuberance.  Lower extremities are atrophied and flexed due to contractures. Skin: no rashes, lesions, ulcers. No induration Neurologic: CN 2-12 grossly intact. Sensation intact, DTR normal. Strength 5/5 in all 4.  Psychiatric: Normal judgment and insight. Alert and oriented x 3. Normal mood.   Labs on Admission: I have personally reviewed following labs and imaging studies  CBC: Recent Labs  Lab 05/07/18 0157  WBC 12.7*  NEUTROABS 10.2*  HGB 10.6*  HCT 35.2*  MCV 90.0  PLT 053   Basic Metabolic Panel: Recent Labs  Lab 05/07/18 0157  NA 140  K 3.4*  CL 98  CO2 28  GLUCOSE 104*  BUN 33*  CREATININE 5.09*  CALCIUM 10.0   GFR: CrCl cannot be calculated (Unknown ideal weight.). Liver Function Tests: Recent Labs  Lab 05/07/18 0157  AST 14*  ALT 10  ALKPHOS 46  BILITOT  0.6  PROT 8.7*  ALBUMIN 3.9   Recent Labs  Lab 05/07/18 0157  LIPASE 49   No results for input(s): AMMONIA in the last 168 hours. Coagulation Profile: No results for input(s): INR, PROTIME in the last 168 hours. Cardiac Enzymes: No results for input(s): CKTOTAL, CKMB, CKMBINDEX, TROPONINI in the last 168 hours. BNP (last 3 results) No results for input(s): PROBNP in the last 8760 hours. HbA1C: No results for input(s): HGBA1C in the last 72 hours. CBG: No results for input(s): GLUCAP in the last 168 hours. Lipid Profile: No results for input(s): CHOL, HDL, LDLCALC, TRIG, CHOLHDL, LDLDIRECT in the last 72 hours. Thyroid Function Tests: No results for input(s): TSH, T4TOTAL, FREET4, T3FREE, THYROIDAB in the last 72 hours. Anemia Panel: No results for input(s): VITAMINB12, FOLATE, FERRITIN, TIBC, IRON, RETICCTPCT in the last 72 hours. Urine analysis:    Component Value Date/Time   COLORURINE YELLOW 05/07/2018 0157   APPEARANCEUR TURBID (A) 05/07/2018 0157   LABSPEC 1.008 05/07/2018 0157   PHURINE 9.0 (H) 05/07/2018 0157   GLUCOSEU NEGATIVE 05/07/2018 0157   HGBUR SMALL (A) 05/07/2018 0157   BILIRUBINUR NEGATIVE 05/07/2018 0157   KETONESUR NEGATIVE 05/07/2018 0157   PROTEINUR 100 (A) 05/07/2018 0157   UROBILINOGEN 0.2 05/11/2015 0906   NITRITE NEGATIVE 05/07/2018 0157   LEUKOCYTESUR MODERATE (A) 05/07/2018 0157    Radiological Exams on Admission: Ct Abdomen Pelvis Wo Contrast  Result Date: 05/07/2018 CLINICAL DATA:  Headache and vomiting since recent fall. EXAM: CT ABDOMEN AND PELVIS WITHOUT CONTRAST TECHNIQUE: Multidetector CT imaging of the abdomen and pelvis was performed following the standard protocol without IV contrast. COMPARISON:  None. FINDINGS: There is generalized anatomic distortion due to caudal regression syndrome and spina bifida. LOWER CHEST: There is no basilar pleural or apical pericardial effusion. HEPATOBILIARY: Unchanged distorted hepatic contours. No  biliary dilatation. The gallbladder is normal. PANCREAS: Difficult to visualize the pancreas well, but no focal abnormality is identified. SPLEEN: Normal. ADRENALS/URINARY TRACT: --Adrenal glands: Not clearly visualized. --Horseshoe kidney: Unchanged chronic hydronephrosis. --Urinary bladder: The urinary bladder shows marked wall thickening, unchanged. STOMACH/BOWEL: Large amount of stool in the rectum proximal to the narrowed pelvic inlet. There is a large colonic stool burden in general. No dilated small bowel. VASCULAR/LYMPHATIC: Limited assessment of the vessels without contrast. No abdominal or pelvic lymphadenopathy. REPRODUCTIVE: Cystic focus in the superior aspect of the gluteal cleft is unchanged. Fat-containing mass in the left lower  quadrant is unchanged, measuring 5.9 cm. MUSCULOSKELETAL. 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 There is a narrowed pelvic inlet secondary to medialization of iliac bones. OTHER: None. IMPRESSION: 1. Unchanged findings of caudal regression syndrome and spina bifida. 2. Large colonic stool burden, greatest just proximal to the narrowed pelvic inlet. 3. Chronic hydronephrosis of horseshoe kidney. 4. Unchanged appearance of left ovarian dermoid. 5. Chronic wall thickening of the urinary bladder. Electronically Signed   By: Ulyses Jarred M.D.   On: 05/07/2018 03:56   Ct Head Wo Contrast  Result Date: 05/07/2018 CLINICAL DATA:  Fall with headache.  Vomiting. EXAM: CT HEAD WITHOUT CONTRAST TECHNIQUE: Contiguous axial images were obtained from the base of the skull through the vertex without intravenous contrast. COMPARISON:  Head CT 07/23/2016 FINDINGS: Brain: There is no mass, hemorrhage or extra-axial collection. The size and configuration of the ventricles and extra-axial CSF spaces are normal. There is no acute or chronic infarction. The brain parenchyma is normal. Vascular: No abnormal hyperdensity of the major intracranial arteries or dural  venous sinuses. No intracranial atherosclerosis. Skull: The visualized skull base, calvarium and extracranial soft tissues are normal. Sinuses/Orbits: No fluid levels or advanced mucosal thickening of the visualized paranasal sinuses. No mastoid or middle ear effusion. The orbits are normal. IMPRESSION: Normal head CT. Electronically Signed   By: Ulyses Jarred M.D.   On: 05/07/2018 02:29    EKG: Independently reviewed.   Assessment/Plan Principal Problem:   Acute UTI Observation/telemetry. Although she has been constipated, this may be the reason for nausea and emesis. Keep n.p.o. for now. Continue Zosyn per pharmacy. Follow-up urine culture and sensitivity.  Active Problems:   Nausea with vomiting Continue Zofran 4 mg every 6 hours as needed. Compazine 5 mg every 6 hours as needed for Zofran is not effective. Constipation relief.    Constipation Would try milk and molasses enema on the floor, if no BM.    ESRD (end stage renal disease) on dialysis [facility]) On HD on Tuesdays, Thursdays and Saturdays. Consult nephrology for dialysis orders. Monitor renal function and electrolytes.    Anemia Monitor hematocrit and hemoglobin. Erythropoietin per nephrology.    HTN (hypertension) Monitor blood pressure.    Spina bifida ([place]) Supportive care.    GERD (gastroesophageal reflux disease) Famotidine 20 mg IVP daily.    Tobacco use Nicotine replacement therapy offered. Staff to provide tobacco cessation information.    Hypokalemia Will just monitor her level given history of ESRD and almost normal level..    DVT prophylaxis: Heparin SQ. Code Status: Full code. Family Communication: Disposition Plan: Observation for constipation relief and IV antibiotics. Consults called: Routine nephrology consult. Admission status: Observation/telemetry.   Reubin Milan MD [facility] Pager [phone].  If 7PM-7AM, please contact night-coverage [email or web address] Password  [email or web address]  05/07/2018, 4:24 AM

## 2018-05-07 NOTE — ED Notes (Signed)
Pt refusing cardiac monitoring. 

## 2018-05-07 NOTE — ED Provider Notes (Signed)
[facility] EMERGENCY DEPARTMENT Provider Note   CSN: [id number] Arrival date & time: 05/07/18  0058     History   Chief Complaint Chief Complaint  Patient presents with  . Abdominal Pain  . Headache    head injury    HPI [name] is a 27 y.o. female.  Patient with history of ESRD on dialysis, spina bifida, wheelchair bound presenting with headache, abdominal pain and vomiting.  States she fell from her wheelchair and hit her head 4 days ago.  There was no loss of consciousness.  She is had a severe frontal headache since with photophobia.  2 days ago she developed multiple episodes of vomiting.  She reports about 5 times total over the past 2 days.  Emesis was clear and yellow and she has also seen some "specks of black".  Denies any bright red blood.  Bowel movements have been irregular and having some constipation and diarrhea.  Stools have been brown.  She complains of diffuse lower abdominal pain.  She did have dialysis today.  She does make some urine with self-catheterization denies any change with this.  Denies any focal weakness, numbness, tingling.  She does have a history of acid reflux but denies history of ulcers. Denies any alcohol or excessive NSAID use.   The history is provided by the patient.    Past Medical History:  Diagnosis Date  . Anemia associated with chronic renal failure   . Blood transfusion   . 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  . Depression 05/14/2015  . Dialysis care   . ESRD (end stage renal disease) on dialysis ([place])   . GERD (gastroesophageal reflux disease) 01/07/2017  . HTN (hypertension) 05/02/2011  . Spina bifida   . UTI (lower urinary tract infection)     Patient Active Problem List   Diagnosis Date Noted  . GERD (gastroesophageal reflux disease) 01/07/2017  . Nausea with vomiting 01/07/2017  . Pyelonephritis 07/31/2015  . Back pain 07/31/2015  . Abdominal pain 07/31/2015  . Constipation 07/31/2015  .  Major depressive disorder, single episode, severe without psychotic features ([place]) 05/15/2015  . Spina bifida ([place]) 01/18/2012  . ESRD (end stage renal disease) on dialysis ([place]) 05/02/2011  . Anemia 05/02/2011  . HTN (hypertension) 05/02/2011    Past Surgical History:  Procedure Laterality Date  . AV FISTULA PLACEMENT     left arm     OB History    Gravida  0   Para  0   Term  0   Preterm  0   AB  0   Living  0     SAB  0   TAB  0   Ectopic  0   Multiple  0   Live Births  0            Home Medications    Prior to Admission medications   Medication Sig Start Date End Date Taking? Authorizing Provider  doxycycline (VIBRAMYCIN) 100 MG capsule Take 1 capsule (100 mg total) by mouth 2 (two) times daily. One po bid x 7 days Patient not taking: Reported on 01/02/2018 02/01/17   Ripley Fraise, MD  Epoetin Alfa (EPOGEN IJ) Inject as directed every Monday, Wednesday, and Friday with hemodialysis.     [provider]  HYDROcodone-acetaminophen (NORCO/VICODIN) 5-325 MG tablet Take 1 tablet by mouth every 6 (six) hours as needed. Patient not taking: Reported on 01/02/2018 02/01/17   Ripley Fraise, MD  hydrocortisone (  ANUSOL-HC) 25 MG suppository Place 1 suppository (25 mg total) rectally daily. For 7 days 01/03/18   Muthersbaugh, Jarrett Soho, PA-C  linaclotide [facility]) 72 MCG capsule Take 1 capsule (72 mcg total) by mouth daily before breakfast. Patient not taking: Reported on 01/02/2018 01/07/17   Carlis Stable, NP  multivitamin (RENA-VIT) TABS tablet Take 1 tablet by mouth every Monday, Wednesday, and Friday with hemodialysis.     [provider]  omeprazole (PRILOSEC) 20 MG capsule Take 20 mg by mouth 2 (two) times daily before a meal.  03/26/16   [provider]  ondansetron (ZOFRAN) 4 MG tablet Take 1 tablet (4 mg total) by mouth every 8 (eight) hours as needed for nausea or vomiting. Patient not taking: Reported on 01/02/2018 01/07/17   Carlis Stable, NP    pantoprazole (PROTONIX) 40 MG tablet Take 1 tablet (40 mg total) by mouth 2 (two) times daily before a meal. Patient not taking: Reported on 01/02/2018 01/07/17   Carlis Stable, NP  sevelamer carbonate (RENVELA) 800 MG tablet Take 2,400 mg by mouth See admin instructions. Take 2,400 mg by mouth one to three times a day, depending on number of meals consumed daily    [provider]    Family History Family History  Problem Relation Age of Onset  . Kidney cancer Other   . Hypertension Maternal Grandmother   . Arthritis Maternal Grandmother   . Breast cancer Maternal Aunt   . Colon cancer Neg Hx     Social History Social History   Tobacco Use  . Smoking status: Current Every Day Smoker    Packs/day: 0.10    Types: Cigarettes  . Smokeless tobacco: Never Used  . Tobacco comment: 2 cigs a day  Substance Use Topics  . Alcohol use: No  . Drug use: No     Allergies   Ciprofloxacin; Other; Peanut-containing drug products; Aleve [naproxen sodium]; Ceftriaxone; Coconut oil; Influenza vaccines; Tetanus toxoid, adsorbed; Tetanus toxoids; and Latex   Review of Systems Review of Systems  Constitutional: Positive for activity change and appetite change. Negative for fever.  HENT: Negative for congestion and rhinorrhea.   Eyes: Positive for photophobia and visual disturbance.  Respiratory: Negative for cough, chest tightness and shortness of breath.   Cardiovascular: Negative for chest pain and leg swelling.  Gastrointestinal: Positive for abdominal pain, blood in stool, constipation, nausea and vomiting.  Genitourinary: Negative for dysuria, hematuria, vaginal bleeding and vaginal discharge.  Musculoskeletal: Negative for back pain and myalgias.  Neurological: Positive for headaches. Negative for dizziness, weakness, light-headedness and numbness.    all other systems are negative except as noted in the HPI and PMH.    Physical Exam Updated Vital Signs BP (!) 125/91 (BP  Location: Right Arm)   Pulse 98   Temp 98.9 F (37.2 C) (Oral)   Resp 16   SpO2 100%   Physical Exam  Constitutional: She is oriented to person, place, and time. She appears well-developed and well-nourished. No distress.  HENT:  Head: Normocephalic and atraumatic.  Mouth/Throat: Oropharynx is clear and moist. No oropharyngeal exudate.  Eyes: Pupils are equal, round, and reactive to light. Conjunctivae and EOM are normal.  Neck: Normal range of motion. Neck supple.  No meningismus.  Cardiovascular: Normal rate, regular rhythm, normal heart sounds and intact distal pulses.  No murmur heard. Pulmonary/Chest: Effort normal and breath sounds normal. No respiratory distress. She exhibits no tenderness.  Abdominal: Soft. There is tenderness. There is no rebound and  no guarding.  Mild lower abdominal tenderness, no guarding or rebound  Genitourinary:  Genitourinary Comments: Chaperone present. Stool palpable distal to finger tip. Unable to reach any to disimpact.   Musculoskeletal: Normal range of motion. She exhibits no edema or tenderness.  Chronic contractures and atrophy of lower extremities secondary to spina bifida  LUE fistula with thrill.   Neurological: She is alert and oriented to person, place, and time. No cranial nerve deficit. She exhibits normal muscle tone. Coordination normal.  Upper extremity grip strength equal. CN 2-12 intact.Equal grip strength.  Lower extremities contracted and weak secondary to spina bifida  Skin: Skin is warm.  Psychiatric: She has a normal mood and affect. Her behavior is normal.  Nursing note and vitals reviewed.    ED Treatments / Results  Labs (all labs ordered are listed, but only abnormal results are displayed) Labs Reviewed  URINALYSIS, ROUTINE W REFLEX MICROSCOPIC - Abnormal; Notable for the following components:      Result Value   APPearance TURBID (*)    pH 9.0 (*)    Hgb urine dipstick SMALL (*)    Protein, ur 100 (*)     Leukocytes, UA MODERATE (*)    RBC / HPF >50 (*)    WBC, UA >50 (*)    Bacteria, UA RARE (*)    All other components within normal limits  CBC WITH DIFFERENTIAL/PLATELET - Abnormal; Notable for the following components:   WBC 12.7 (*)    Hemoglobin 10.6 (*)    HCT 35.2 (*)    Neutro Abs 10.2 (*)    All other components within normal limits  COMPREHENSIVE METABOLIC PANEL - Abnormal; Notable for the following components:   Potassium 3.4 (*)    Glucose, Bld 104 (*)    BUN 33 (*)    Creatinine, Ser 5.09 (*)    Total Protein 8.7 (*)    AST 14 (*)    GFR calc non Af Amer 11 (*)    GFR calc Af Amer 13 (*)    All other components within normal limits  URINE CULTURE  PREGNANCY, URINE  LIPASE, BLOOD  OCCULT BLOOD GASTRIC / DUODENUM (SPECIMEN CUP)  I-STAT BETA HCG BLOOD, ED ([facility], [facility], [facility] ONLY)  POC OCCULT BLOOD, ED    EKG None  Radiology Ct Abdomen Pelvis Wo Contrast  Result Date: 05/07/2018 CLINICAL DATA:  Headache and vomiting since recent fall. EXAM: CT ABDOMEN AND PELVIS WITHOUT CONTRAST TECHNIQUE: Multidetector CT imaging of the abdomen and pelvis was performed following the standard protocol without IV contrast. COMPARISON:  None. FINDINGS: There is generalized anatomic distortion due to caudal regression syndrome and spina bifida. LOWER CHEST: There is no basilar pleural or apical pericardial effusion. HEPATOBILIARY: Unchanged distorted hepatic contours. No biliary dilatation. The gallbladder is normal. PANCREAS: Difficult to visualize the pancreas well, but no focal abnormality is identified. SPLEEN: Normal. ADRENALS/URINARY TRACT: --Adrenal glands: Not clearly visualized. --Horseshoe kidney: Unchanged chronic hydronephrosis. --Urinary bladder: The urinary bladder shows marked wall thickening, unchanged. STOMACH/BOWEL: Large amount of stool in the rectum proximal to the narrowed pelvic inlet. There is a large colonic stool burden in general. No dilated small bowel. VASCULAR/LYMPHATIC:  Limited assessment of the vessels without contrast. No abdominal or pelvic lymphadenopathy. REPRODUCTIVE: Cystic focus in the superior aspect of the gluteal cleft is unchanged. Fat-containing mass in the left lower quadrant is unchanged, measuring 5.9 cm. MUSCULOSKELETAL. 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 There is a narrowed pelvic inlet  secondary to medialization of iliac bones. OTHER: None. IMPRESSION: 1. Unchanged findings of caudal regression syndrome and spina bifida. 2. Large colonic stool burden, greatest just proximal to the narrowed pelvic inlet. 3. Chronic hydronephrosis of horseshoe kidney. 4. Unchanged appearance of left ovarian dermoid. 5. Chronic wall thickening of the urinary bladder. Electronically Signed   By: Ulyses Jarred M.D.   On: 05/07/2018 03:56   Ct Head Wo Contrast  Result Date: 05/07/2018 CLINICAL DATA:  Fall with headache.  Vomiting. EXAM: CT HEAD WITHOUT CONTRAST TECHNIQUE: Contiguous axial images were obtained from the base of the skull through the vertex without intravenous contrast. COMPARISON:  Head CT 07/23/2016 FINDINGS: Brain: There is no mass, hemorrhage or extra-axial collection. The size and configuration of the ventricles and extra-axial CSF spaces are normal. There is no acute or chronic infarction. The brain parenchyma is normal. Vascular: No abnormal hyperdensity of the major intracranial arteries or dural venous sinuses. No intracranial atherosclerosis. Skull: The visualized skull base, calvarium and extracranial soft tissues are normal. Sinuses/Orbits: No fluid levels or advanced mucosal thickening of the visualized paranasal sinuses. No mastoid or middle ear effusion. The orbits are normal. IMPRESSION: Normal head CT. Electronically Signed   By: Ulyses Jarred M.D.   On: 05/07/2018 02:29    Procedures Procedures (including critical care time)  Medications Ordered in ED Medications - No data to display   Initial Impression /  Assessment and Plan / ED Course  I have reviewed the triage vital signs and the nursing notes.  Pertinent labs & imaging results that were available during my care of the patient were reviewed by me and considered in my medical decision making (see chart for details).    Patient with ESRD on dialysis and spina bifida, wheelchair-bound presenting with head injury, nausea, vomiting and possible coffee-ground emesis.  Also has had constipation.  No change in her chronic lower extremity weakness. CT head obtained given her head injury and this is negative.  Patient does have constipation and fecal impaction but none can be reached on rectal exam. Labs show normal potassium.  UA consistent with infection and culture sent.  Previous cultures reviewed.  Patient with multiple allergies but has tolerated Zosyn in the past.  CT head is negative.  Hemoccult and gastric ulcer are both negative.  Patient remains hematin medically stable in the ED. Abdominal CT without acute pathology.  Does show constipation and patient given enema.  Nausea and vomiting may be due to more to UTI and constipation and head injury.  Will treat with IV antibiotics for possible pyelonephritis.  Patient still feeling poorly and agreeable to observation admission.  Discussed with Dr. Olevia Bowens. Final Clinical Impressions(s) / ED Diagnoses   Final diagnoses:  Pyelonephritis  Non-intractable vomiting with nausea, unspecified vomiting type    ED Discharge Orders    None       Johnnie Goynes, Annie Main, MD 05/07/18 [phone]

## 2018-05-07 NOTE — Progress Notes (Signed)
PROGRESS NOTE  [name] IPJ:[id number] DOB: [birth year] DOA: 05/07/2018 PCP: Lucianne Lei, MD  Brief History:  27 year old female with a history of ESRD ([place]), spina bifida, spinal/caudal regression syndrome, GERD, hypertension, depression presenting with headache and nausea and vomiting with coffee grounds.  Patient states that she had a mechanical fall out of her wheelchair on 05/03/2018 and hit her head.  She has had a frontal headache with photophobia since that time.  Beginning on 05/05/2018, the patient began having multiple episodes of nausea and vomiting.  She states that she had 3 episodes of emesis prior to coming to the emergency department.  She had noted some black material as well as small amounts of bright red blood.  Because of emesis and associate abdominal pain, the patient presented for further evaluation.  The patient states that she normally has 1 bowel movement per week.  She does not use any medications or stool softeners to assist with bowel movements.  She was previously seen by GI in June 2018 who prescribed the patient Linzess, but the patient is currently not taking it.  The patient denies any fevers, chills, chest pain, shortness breath.  There is no hematochezia, melena.  The patient self catheterizes herself when she feels she has to urinate.  She states that she self catheterizes to 3 times per day.  She denies any focal extremity weakness or visual disturbance.  She denies any NSAID use or alcohol use.  She denies any changes in her medications.  In the emergency department, the patient was noted to have WBC 12.7 but was afebrile hemodynamically stable saturating 100% on room air.  CT of the abdomen and pelvis showed chronic pressure kidney with hydronephrosis which is also been chronic.  There is marked thickening of the urinary bladder.  There is a large stool burden greatest at the narrowed pelvic and left.  There is an unchanged left ovarian dermoid cyst.   Patient was given a fleets enema with one bowel movement.  Her abdominal pain improved.  However she continued to fail week.  As result, the patient was admitted for further evaluation.  The patient was also given Zosyn, Compazine, Phenergan with some improvement of her nausea and vomiting.  Assessment/Plan: Intractable nausea and vomiting -Likely due to combination of constipation and UTI -Start PPI -Clear liquid diet -Zofran PRN  Constipation -Patient had bowel movement after a fleets enema -Start MiraLAX and Senokot daily  Pyuria -Patient states that she still produces urine and self catheterizes -May be related to trauma from self-catheterization as well as underlying GU abnormalities -In the setting of leukocytosis and her clinical syndrome, start zosyn pending culture data  ESRD -Last dialyzed 05/06/2018 -If patient's hospitalization was prolonged beyond 05/08/2018--she will need dialysis  Essential hypertension -She has not had any hyper tensive medications presently -Monitor clinically  Hypokalemia -In a patient with ESRD and mild hypokalemia, monitor clinically without repletion at this point  Spinal/caudal regression syndrome with spina bifida -PT evaluation  Tobacco abuse -Tobacco cessation discussed    Disposition Plan:   Home in 1-2 days  Family Communication:  No Family at bedside--Total time spent 35 minutes.  Greater than 50% spent face to face counseling and coordinating care. 0655 to 0730   Consultants:  renal  Code Status:  FULL  DVT Prophylaxis:  [place] Heparin     Procedures: As Listed in Progress Note Above  Antibiotics: Zosyn 10/11>>    Subjective: Patient  states that her vomiting is improved but she still has nausea.  She denies any fevers, chills, chest pain, shortness breath, abdominal pain, headache, visual disturbance.  Objective: Vitals:   05/07/18 0200 05/07/18 0426 05/07/18 0525 05/07/18 0530  BP: 124/71 124/83 (!) 143/81  135/85  Pulse:  92 93   Resp:  16 15 14   Temp:      TempSrc:      SpO2:  96% 95% 96%    Intake/Output Summary (Last 24 hours) at 05/07/2018 0714 Last data filed at 05/07/2018 0550 Gross per 24 hour  Intake 430.31 ml  Output 200 ml  Net 230.31 ml   Weight change:  Exam:   General:  Pt is alert, follows commands appropriately, not in acute distress  HEENT: No icterus, No thrush, No neck mass, [place]/AT  Cardiovascular: RRR, S1/S2, no rubs, no gallops  Respiratory: CTA bilaterally, no wheezing, no crackles, no rhonchi  Abdomen: Soft/+BS, non tender, non distended, no guarding  Extremities: No edema, No lymphangitis, No petechiae, No rashes, no synovitis   Data Reviewed: I have personally reviewed following labs and imaging studies Basic Metabolic Panel: Recent Labs  Lab 05/07/18 0157  NA 140  K 3.4*  CL 98  CO2 28  GLUCOSE 104*  BUN 33*  CREATININE 5.09*  CALCIUM 10.0   Liver Function Tests: Recent Labs  Lab 05/07/18 0157  AST 14*  ALT 10  ALKPHOS 46  BILITOT 0.6  PROT 8.7*  ALBUMIN 3.9   Recent Labs  Lab 05/07/18 0157  LIPASE 49   No results for input(s): AMMONIA in the last 168 hours. Coagulation Profile: No results for input(s): INR, PROTIME in the last 168 hours. CBC: Recent Labs  Lab 05/07/18 0157  WBC 12.7*  NEUTROABS 10.2*  HGB 10.6*  HCT 35.2*  MCV 90.0  PLT 268   Cardiac Enzymes: No results for input(s): CKTOTAL, CKMB, CKMBINDEX, TROPONINI in the last 168 hours. BNP: Invalid input(s): POCBNP CBG: No results for input(s): GLUCAP in the last 168 hours. HbA1C: No results for input(s): HGBA1C in the last 72 hours. Urine analysis:    Component Value Date/Time   COLORURINE YELLOW 05/07/2018 0157   APPEARANCEUR TURBID (A) 05/07/2018 0157   LABSPEC 1.008 05/07/2018 0157   PHURINE 9.0 (H) 05/07/2018 0157   GLUCOSEU NEGATIVE 05/07/2018 0157   HGBUR SMALL (A) 05/07/2018 0157   BILIRUBINUR NEGATIVE 05/07/2018 0157   KETONESUR  NEGATIVE 05/07/2018 0157   PROTEINUR 100 (A) 05/07/2018 0157   UROBILINOGEN 0.2 05/11/2015 0906   NITRITE NEGATIVE 05/07/2018 0157   LEUKOCYTESUR MODERATE (A) 05/07/2018 0157   Sepsis Labs: @LABRCNTIP (procalcitonin:4,lacticidven:4) )No results found for this or any previous visit (from the past 240 hour(s)).   Scheduled Meds: . iopamidol       Continuous Infusions: . sodium chloride Stopped (05/07/18 0550)  . famotidine (PEPCID) IV Stopped (05/07/18 0550)    Procedures/Studies: Ct Abdomen Pelvis Wo Contrast  Result Date: 05/07/2018 CLINICAL DATA:  Headache and vomiting since recent fall. EXAM: CT ABDOMEN AND PELVIS WITHOUT CONTRAST TECHNIQUE: Multidetector CT imaging of the abdomen and pelvis was performed following the standard protocol without IV contrast. COMPARISON:  None. FINDINGS: There is generalized anatomic distortion due to caudal regression syndrome and spina bifida. LOWER CHEST: There is no basilar pleural or apical pericardial effusion. HEPATOBILIARY: Unchanged distorted hepatic contours. No biliary dilatation. The gallbladder is normal. PANCREAS: Difficult to visualize the pancreas well, but no focal abnormality is identified. SPLEEN: Normal. ADRENALS/URINARY TRACT: --Adrenal glands: Not clearly visualized. --  Horseshoe kidney: Unchanged chronic hydronephrosis. --Urinary bladder: The urinary bladder shows marked wall thickening, unchanged. STOMACH/BOWEL: Large amount of stool in the rectum proximal to the narrowed pelvic inlet. There is a large colonic stool burden in general. No dilated small bowel. VASCULAR/LYMPHATIC: Limited assessment of the vessels without contrast. No abdominal or pelvic lymphadenopathy. REPRODUCTIVE: Cystic focus in the superior aspect of the gluteal cleft is unchanged. Fat-containing mass in the left lower quadrant is unchanged, measuring 5.9 cm. MUSCULOSKELETAL. 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 There is a narrowed pelvic  inlet secondary to medialization of iliac bones. OTHER: None. IMPRESSION: 1. Unchanged findings of caudal regression syndrome and spina bifida. 2. Large colonic stool burden, greatest just proximal to the narrowed pelvic inlet. 3. Chronic hydronephrosis of horseshoe kidney. 4. Unchanged appearance of left ovarian dermoid. 5. Chronic wall thickening of the urinary bladder. Electronically Signed   By: Ulyses Jarred M.D.   On: 05/07/2018 03:56   Ct Head Wo Contrast  Result Date: 05/07/2018 CLINICAL DATA:  Fall with headache.  Vomiting. EXAM: CT HEAD WITHOUT CONTRAST TECHNIQUE: Contiguous axial images were obtained from the base of the skull through the vertex without intravenous contrast. COMPARISON:  Head CT 07/23/2016 FINDINGS: Brain: There is no mass, hemorrhage or extra-axial collection. The size and configuration of the ventricles and extra-axial CSF spaces are normal. There is no acute or chronic infarction. The brain parenchyma is normal. Vascular: No abnormal hyperdensity of the major intracranial arteries or dural venous sinuses. No intracranial atherosclerosis. Skull: The visualized skull base, calvarium and extracranial soft tissues are normal. Sinuses/Orbits: No fluid levels or advanced mucosal thickening of the visualized paranasal sinuses. No mastoid or middle ear effusion. The orbits are normal. IMPRESSION: Normal head CT. Electronically Signed   By: Ulyses Jarred M.D.   On: 05/07/2018 02:29    Orson Eva, DO  [facility] Pager [phone]  If 7PM-7AM, please contact night-coverage [email or web address] Password [email or web address] 05/07/2018, 7:14 AM   LOS: 0 days

## 2018-05-07 NOTE — ED Notes (Signed)
Patient transported to CT 

## 2018-05-07 NOTE — Progress Notes (Signed)
ANTIBIOTIC CONSULT NOTE-Preliminary  Pharmacy Consult for zosyn Indication: intra-abdominal infection  Allergies  Allergen Reactions  . Ciprofloxacin Shortness Of Breath, Nausea And Vomiting and Other (See Comments)    HIGH FEVER and oral blisters   . Other Anaphylaxis    Revaclear dialzer  . Peanut-Containing Drug Products Anaphylaxis  . Aleve [Naproxen Sodium] Other (See Comments)    G.I.Bleed  . Ceftriaxone Other (See Comments)    Blisters in mouth   . Coconut Oil Hives  . Influenza Vaccines Nausea And Vomiting and Other (See Comments)    High fever  . Tetanus Toxoid, Adsorbed Nausea And Vomiting and Other (See Comments)    HIGH FEVER, also  . Tetanus Toxoids Nausea And Vomiting and Other (See Comments)    HIGH FEVER  . Latex Itching and Rash    Patient Measurements:   Adjusted Body Weight:   Vital Signs: Temp: 98.9 F (37.2 C) (10/11 0112) Temp Source: Oral (10/11 0112) BP: 124/83 (10/11 0426) Pulse Rate: 92 (10/11 0426)  Labs: Recent Labs    05/07/18 0157  WBC 12.7*  HGB 10.6*  PLT 268  CREATININE 5.09*    CrCl cannot be calculated (Unknown ideal weight.).  No results for input(s): VANCOTROUGH, VANCOPEAK, VANCORANDOM, GENTTROUGH, GENTPEAK, GENTRANDOM, TOBRATROUGH, TOBRAPEAK, TOBRARND, AMIKACINPEAK, AMIKACINTROU, AMIKACIN in the last 72 hours.   Microbiology: No results found for this or any previous visit (from the past 720 hour(s)).  Medical History: Past Medical History:  Diagnosis Date  . Anemia associated with chronic renal failure   . Blood transfusion   . 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  . Depression 05/14/2015  . Dialysis care   . ESRD (end stage renal disease) on dialysis ([place])   . GERD (gastroesophageal reflux disease) 01/07/2017  . HTN (hypertension) 05/02/2011  . Spina bifida   . UTI (lower urinary tract infection)     Medications:  Scheduled:  . iopamidol       Infusions:  . sodium chloride 1,000 mL  (05/07/18 0359)  . famotidine (PEPCID) IV    . piperacillin-tazobactam (ZOSYN)  IV 3.375 g (05/07/18 0359)   Anti-infectives (From admission, onward)   Start     Dose/Rate Route Frequency Ordered Stop   05/07/18 0345  piperacillin-tazobactam (ZOSYN) IVPB 3.375 g     3.375 g 12.5 mL/hr over 240 Minutes Intravenous  Once 05/07/18 0331        Assessment: 27 yo female being admitted for acute UTI.  Pt has ESRD on dialysis, spina bifida, and is wheelchair bound.  Pharmacy has been consulted to dose zosyn for intra-abdominal infection.    Plan:  Preliminary review of pertinent patient information completed.  Protocol will be initiated with dose(s) of zosyn 3.375 grams x 1.  Forestine Na clinical pharmacist will complete review during morning rounds to assess patient and finalize treatment regimen if needed.  Allayah Raineri Scarlett, RPH 05/07/2018,5:08 AM

## 2018-05-07 NOTE — ED Notes (Signed)
gastrocult negative per Dr Wyvonnia Dusky.

## 2018-05-07 NOTE — ED Triage Notes (Addendum)
Pt states she fell and hit her head on Monday, states has been having a headache since then and has also been vomiting with coffee ground emesis, but not sure if r/t headache.  Pt does not think she had LOC with the fall. Pt states her abd pain started on Wednesday.

## 2018-05-07 NOTE — Progress Notes (Addendum)
Pharmacy Antibiotic Note  [name] is a 27 y.o.ESRD patient   admitted on 05/07/2018 with intra-abdominal infection.  Pharmacy has been consulted for Zosyn dosing.  Plan: Give Zosyn 3.375g IV x1 dose, then start Zosyn 3.375g IV q12h Monitor labs, renal function and culture results  Weight: 62 lb 13.3 oz (28.5 kg)  Temp (24hrs), Avg:98.9 F (37.2 C), Min:98.9 F (37.2 C), Max:98.9 F (37.2 C)  Recent Labs  Lab 05/07/18 0157  WBC 12.7*  CREATININE 5.09*    CrCl cannot be calculated (Unknown ideal weight.).    Allergies  Allergen Reactions  . Ciprofloxacin Shortness Of Breath, Nausea And Vomiting and Other (See Comments)    HIGH FEVER and oral blisters   . Other Anaphylaxis    Revaclear dialzer  . Peanut-Containing Drug Products Anaphylaxis  . Aleve [Naproxen Sodium] Other (See Comments)    G.I.Bleed  . Ceftriaxone Other (See Comments)    Blisters in mouth   . Coconut Oil Hives  . Influenza Vaccines Nausea And Vomiting and Other (See Comments)    High fever  . Tetanus Toxoid, Adsorbed Nausea And Vomiting and Other (See Comments)    HIGH FEVER, also  . Tetanus Toxoids Nausea And Vomiting and Other (See Comments)    HIGH FEVER  . Latex Itching and Rash    Antimicrobials this admission: Zosyn 10/11>>      Dose adjustments this admission: Pt to be started on HD soon  Microbiology results:  10/11 UCx:  pending      Thank you for allowing pharmacy to participate in this patient's care.  Despina Pole 05/07/2018 8:37 AM

## 2018-05-08 DIAGNOSIS — Z992 Dependence on renal dialysis: Secondary | ICD-10-CM | POA: Diagnosis not present

## 2018-05-08 DIAGNOSIS — N390 Urinary tract infection, site not specified: Secondary | ICD-10-CM | POA: Diagnosis not present

## 2018-05-08 DIAGNOSIS — R112 Nausea with vomiting, unspecified: Secondary | ICD-10-CM | POA: Diagnosis not present

## 2018-05-08 DIAGNOSIS — Q056 Thoracic spina bifida without hydrocephalus: Secondary | ICD-10-CM

## 2018-05-08 DIAGNOSIS — E876 Hypokalemia: Secondary | ICD-10-CM | POA: Diagnosis not present

## 2018-05-08 DIAGNOSIS — K5909 Other constipation: Secondary | ICD-10-CM | POA: Diagnosis not present

## 2018-05-08 DIAGNOSIS — Z720 Tobacco use: Secondary | ICD-10-CM | POA: Diagnosis not present

## 2018-05-08 DIAGNOSIS — N186 End stage renal disease: Secondary | ICD-10-CM | POA: Diagnosis not present

## 2018-05-08 LAB — RENAL FUNCTION PANEL
ALBUMIN: 3.5 g/dL (ref 3.5–5.0)
ANION GAP: 14 (ref 5–15)
BUN: 50 mg/dL — ABNORMAL HIGH (ref 6–20)
CHLORIDE: 99 mmol/L (ref 98–111)
CO2: 27 mmol/L (ref 22–32)
Calcium: 10.3 mg/dL (ref 8.9–10.3)
Creatinine, Ser: 7.29 mg/dL — ABNORMAL HIGH (ref 0.44–1.00)
GFR calc Af Amer: 8 mL/min — ABNORMAL LOW (ref >60)
GFR calc non Af Amer: 7 mL/min — ABNORMAL LOW (ref >60)
GLUCOSE: 86 mg/dL (ref 70–99)
PHOSPHORUS: 9.1 mg/dL — AB (ref 2.5–4.6)
POTASSIUM: 4.1 mmol/L (ref 3.5–5.1)
Sodium: 140 mmol/L (ref 135–145)

## 2018-05-08 LAB — DIFFERENTIAL
Abs Immature Granulocytes: 0.01 10*3/uL (ref 0.00–0.07)
Basophils Absolute: 0 10*3/uL (ref 0.0–0.1)
Basophils Relative: 1 %
EOS ABS: 0.5 10*3/uL (ref 0.0–0.5)
EOS PCT: 11 %
Immature Granulocytes: 0 %
LYMPHS ABS: 1.7 10*3/uL (ref 0.7–4.0)
Lymphocytes Relative: 41 %
Monocytes Absolute: 0.5 10*3/uL (ref 0.1–1.0)
Monocytes Relative: 12 %
Neutro Abs: 1.4 10*3/uL — ABNORMAL LOW (ref 1.7–7.7)
Neutrophils Relative %: 35 %

## 2018-05-08 LAB — CBC
HCT: 33.7 % — ABNORMAL LOW (ref 36.0–46.0)
Hemoglobin: 10.3 g/dL — ABNORMAL LOW (ref 12.0–15.0)
MCH: 28.5 pg (ref 26.0–34.0)
MCHC: 30.6 g/dL (ref 30.0–36.0)
MCV: 93.1 fL (ref 80.0–100.0)
Platelets: 263 10*3/uL (ref 150–400)
RBC: 3.62 MIL/uL — AB (ref 3.87–5.11)
RDW: 15.1 % (ref 11.5–15.5)
WBC: 4.1 10*3/uL (ref 4.0–10.5)
nRBC: 0 % (ref 0.0–0.2)

## 2018-05-08 LAB — MRSA PCR SCREENING: MRSA by PCR: NEGATIVE

## 2018-05-08 LAB — MAGNESIUM: MAGNESIUM: 2.5 mg/dL — AB (ref 1.7–2.4)

## 2018-05-08 MED ORDER — LIDOCAINE-PRILOCAINE 2.5-2.5 % EX CREA: 1.0000 "application " | TOPICAL_CREAM | CUTANEOUS | Status: DC | PRN

## 2018-05-08 MED ORDER — LIDOCAINE HCL (PF) 1 % IJ SOLN: 5.0000 mL | INTRAMUSCULAR | Status: DC | PRN

## 2018-05-08 MED ORDER — CHLORHEXIDINE GLUCONATE CLOTH 2 % EX PADS
6.0000 | MEDICATED_PAD | Freq: Every day | CUTANEOUS | Status: DC
  Administered 2018-05-08: 6 via TOPICAL

## 2018-05-08 MED ORDER — PENTAFLUOROPROP-TETRAFLUOROETH EX AERO: 1.0000 "application " | INHALATION_SPRAY | CUTANEOUS | Status: DC | PRN

## 2018-05-08 MED ORDER — LINACLOTIDE 72 MCG PO CAPS
72.0000 ug | ORAL_CAPSULE | Freq: Every day | ORAL | Status: DC
  Administered 2018-05-09: 72 ug via ORAL
  Filled 2018-05-08 (×2): qty 1

## 2018-05-08 MED ORDER — SODIUM CHLORIDE 0.9 % IV SOLN: 100.0000 mL | INTRAVENOUS | Status: DC | PRN

## 2018-05-08 NOTE — Progress Notes (Signed)
Subjective: Interval History: has no complaint of nausea or vomiting.  Patient is states that she was able to tolerate feeding.  She denies also any abdominal pain..  Objective: Vital signs in last 24 hours: Temp:  [97.9 F (36.6 C)-98.3 F (36.8 C)] 97.9 F (36.6 C) (10/12 0530) Pulse Rate:  [81-95] 81 (10/12 0530) Resp:  [16-18] 16 (10/12 0530) BP: (120-148)/(83-89) 120/85 (10/12 0530) SpO2:  [99 %-100 %] 100 % (10/12 0530) Weight change:   Intake/Output from previous day: 10/11 0701 - 10/12 0700 In: 50 [IV Piggyback:50] Out: -  Intake/Output this shift: No intake/output data recorded.  General appearance: alert, cooperative and no distress Resp: clear to auscultation bilaterally Cardio: regular rate and rhythm Extremities: No edema  Lab Results: Recent Labs    05/07/18 0157  WBC 12.7*  HGB 10.6*  HCT 35.2*  PLT 268   BMET:  Recent Labs    05/07/18 0157  NA 140  K 3.4*  CL 98  CO2 28  GLUCOSE 104*  BUN 33*  CREATININE 5.09*  CALCIUM 10.0   No results for input(s): PTH in the last 72 hours. Iron Studies: No results for input(s): IRON, TIBC, TRANSFERRIN, FERRITIN in the last 72 hours.  Studies/Results: Ct Abdomen Pelvis Wo Contrast  Result Date: 05/07/2018 CLINICAL DATA:  Headache and vomiting since recent fall. EXAM: CT ABDOMEN AND PELVIS WITHOUT CONTRAST TECHNIQUE: Multidetector CT imaging of the abdomen and pelvis was performed following the standard protocol without IV contrast. COMPARISON:  None. FINDINGS: There is generalized anatomic distortion due to caudal regression syndrome and spina bifida. LOWER CHEST: There is no basilar pleural or apical pericardial effusion. HEPATOBILIARY: Unchanged distorted hepatic contours. No biliary dilatation. The gallbladder is normal. PANCREAS: Difficult to visualize the pancreas well, but no focal abnormality is identified. SPLEEN: Normal. ADRENALS/URINARY TRACT: --Adrenal glands: Not clearly visualized. --Horseshoe  kidney: Unchanged chronic hydronephrosis. --Urinary bladder: The urinary bladder shows marked wall thickening, unchanged. STOMACH/BOWEL: Large amount of stool in the rectum proximal to the narrowed pelvic inlet. There is a large colonic stool burden in general. No dilated small bowel. VASCULAR/LYMPHATIC: Limited assessment of the vessels without contrast. No abdominal or pelvic lymphadenopathy. REPRODUCTIVE: Cystic focus in the superior aspect of the gluteal cleft is unchanged. Fat-containing mass in the left lower quadrant is unchanged, measuring 5.9 cm. MUSCULOSKELETAL. 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 There is a narrowed pelvic inlet secondary to medialization of iliac bones. OTHER: None. IMPRESSION: 1. Unchanged findings of caudal regression syndrome and spina bifida. 2. Large colonic stool burden, greatest just proximal to the narrowed pelvic inlet. 3. Chronic hydronephrosis of horseshoe kidney. 4. Unchanged appearance of left ovarian dermoid. 5. Chronic wall thickening of the urinary bladder. Electronically Signed   By: Ulyses Jarred M.D.   On: 05/07/2018 03:56   Ct Head Wo Contrast  Result Date: 05/07/2018 CLINICAL DATA:  Fall with headache.  Vomiting. EXAM: CT HEAD WITHOUT CONTRAST TECHNIQUE: Contiguous axial images were obtained from the base of the skull through the vertex without intravenous contrast. COMPARISON:  Head CT 07/23/2016 FINDINGS: Brain: There is no mass, hemorrhage or extra-axial collection. The size and configuration of the ventricles and extra-axial CSF spaces are normal. There is no acute or chronic infarction. The brain parenchyma is normal. Vascular: No abnormal hyperdensity of the major intracranial arteries or dural venous sinuses. No intracranial atherosclerosis. Skull: The visualized skull base, calvarium and extracranial soft tissues are normal. Sinuses/Orbits: No fluid levels or advanced mucosal thickening  of the visualized paranasal sinuses.  No mastoid or middle ear effusion. The orbits are normal. IMPRESSION: Normal head CT. Electronically Signed   By: Ulyses Jarred M.D.   On: 05/07/2018 02:29    I have reviewed the patient's current medications.  Assessment/Plan: 1] nausea and vomiting.  Presently she is feeling much better.  She tolerated her supper.  Presently patient states that she does not eat breakfast before she goes to dialysis because of nausea and vomiting hence she is now planning to eat this morning. 2] end-stage renal disease: She is status post hemodialysis on Thursday.  Patient is due for dialysis today.  Potassium is normal 3] UTI: Patient on antibiotics.  She is a febrile.  Her white blood cell count is still high 4] anemia: Her hemoglobin is within our target goal. 5] bone and mineral disorder: Calcium is range.  Phosphorus however is pending. 6] fluid management: No significant sign of fluid overload 7] spinal bifida Plan: 1] we will make arrangement for patient to get dialysis today for 3 hours. 2] we will hold fluid removal since patient does not have any significant sign of fluid overload 3] we will hold heparin. 4] we will check a renal panel and CBC in the morning.     LOS: 0 days   Mckinsley Koelzer S 05/08/2018,8:27 AM

## 2018-05-08 NOTE — Procedures (Signed)
    HEMODIALYSIS TREATMENT NOTE:   2 hours and 15 minutes of dialysis completed via left upper arm AVF (16g/antegrade).  No fluid removal per nephrologist's order.  Pt asked to end session 45 minutes early/AMA.  All blood was returned and hemostasis was achieved in 5 minutes.  Hemodynamically stable throughout HD session.  Rockwell Alexandria, RN

## 2018-05-08 NOTE — Progress Notes (Signed)
PROGRESS NOTE  [name] QIO:[id number] DOB: [birth year] DOA: 05/07/2018 PCP: Lucianne Lei, MD Brief History:  27 year old female with a history of ESRD ([place]), spina bifida, spinal/caudal regression syndrome, GERD, hypertension, depression presenting with headache and nausea and vomiting with coffee grounds.  Patient states that she had a mechanical fall out of her wheelchair on 05/03/2018 and hit her head.  She has had a frontal headache with photophobia since that time.  Beginning on 05/05/2018, the patient began having multiple episodes of nausea and vomiting.  She states that she had 3 episodes of emesis prior to coming to the emergency department.  She had noted some black material as well as small amounts of bright red blood.  Because of emesis and associate abdominal pain, the patient presented for further evaluation.  The patient states that she normally has 1 bowel movement per week.  She does not use any medications or stool softeners to assist with bowel movements.  She was previously seen by GI in June 2018 who prescribed the patient Linzess, but the patient ran out of samples 2 weeks ago. The patient denies any fevers, chills, chest pain, shortness breath.  There is no hematochezia, melena.  The patient self catheterizes herself when she feels she has to urinate.  She states that she self catheterizes to 3 times per day.  She denies any focal extremity weakness or visual disturbance.  She denies any NSAID use or alcohol use.  She denies any changes in her medications.  In the emergency department, the patient was noted to have WBC 12.7 but was afebrile hemodynamically stable saturating 100% on room air.  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  There is marked thickening of the urinary bladder.  There is a large stool burden greatest at the narrowed pelvic and left.  There is an unchanged left ovarian dermoid  cyst.  Patient was given a fleets enema with one bowel movement.  Her abdominal pain improved.  However she continued to fail week.  As result, the patient was admitted for further evaluation.  The patient was also given Zosyn, Compazine, Phenergan with some improvement of her nausea and vomiting.  Assessment/Plan: Intractable nausea and vomiting -Likely due to combination of constipation and UTI -Start PPI -still having some difficulty reliably toleration renal diet -Zofran PRN -because pt has not yet demonstrated reliably the ability tolerate diet, she is at risk for recurrent vomiting and readmit  Constipation -Patient had bowel movement after a fleets enema -Start MiraLAX and Senokot daily -restart Linzess--pt states she ran out of samples at home  Pyuria -Patient states that she still produces urine and self catheterizes -May be related to trauma from self-catheterization as well as underlying GU abnormalities -In the setting of leukocytosis and her clinical syndrome, continue zosyn pending culture data  ESRD -Last dialyzed 05/06/2018 -pt to have dialysis 05/08/18  Essential hypertension -She has not had any hyper tensive medications presently -Monitor clinically  Hypokalemia -In a patient with ESRD and mild hypokalemia, monitor clinically without repletion at this point  Spinal/caudal regression syndrome with spina bifida -PT evaluation  Tobacco abuse -Tobacco cessation discussed    Disposition Plan:   Home 10/13 if stable Family Communication:  No Family at bedside   Consultants:  renal  Code Status:  FULL  DVT Prophylaxis:  [address] Heparin     Procedures: As Listed in Progress Note  Above  Antibiotics: Zosyn 10/11>>   Subjective: Pt feeling better overall, but still with some nausea.  Afraid to eat before dialysis today due to concerns for n/v.  abd pain is improved.  Had another BM last night.  No diarrhea. No f/c, cp,  sob  Objective: Vitals:   05/07/18 1039 05/07/18 2042 05/07/18 2105 05/08/18 0530  BP: (!) 148/89  138/83 120/85  Pulse: 89  95 81  Resp: 18  18 16   Temp: 98.3 F (36.8 C)  98.3 F (36.8 C) 97.9 F (36.6 C)  TempSrc: Oral  Oral Oral  SpO2: 100% 99% 100% 100%  Weight:        Intake/Output Summary (Last 24 hours) at 05/08/2018 1236 Last data filed at 05/08/2018 0924 Gross per 24 hour  Intake 290 ml  Output -  Net 290 ml   Weight change:  Exam:   General:  Pt is alert, follows commands appropriately, not in acute distress  HEENT: No icterus, No thrush, No neck mass, [place]/AT  Cardiovascular: RRR, S1/S2, no rubs, no gallops  Respiratory: CTA bilaterally, no wheezing, no crackles, no rhonchi  Abdomen: Soft/+BS, non tender, non distended, no guarding  Extremities: No edema, No lymphangitis, No petechiae, No rashes, no synovitis   Data Reviewed: I have personally reviewed following labs and imaging studies Basic Metabolic Panel: Recent Labs  Lab 05/07/18 0157 05/08/18 0500  NA 140 140  K 3.4* 4.1  CL 98 99  CO2 28 27  GLUCOSE 104* 86  BUN 33* 50*  CREATININE 5.09* 7.29*  CALCIUM 10.0 10.3  MG  --  2.5*  PHOS  --  9.1*   Liver Function Tests: Recent Labs  Lab 05/07/18 0157 05/08/18 0500  AST 14*  --   ALT 10  --   ALKPHOS 46  --   BILITOT 0.6  --   PROT 8.7*  --   ALBUMIN 3.9 3.5   Recent Labs  Lab 05/07/18 0157  LIPASE 49   No results for input(s): AMMONIA in the last 168 hours. Coagulation Profile: No results for input(s): INR, PROTIME in the last 168 hours. CBC: Recent Labs  Lab 05/07/18 0157 05/08/18 0500  WBC 12.7* 4.1  NEUTROABS 10.2* 1.4*  HGB 10.6* 10.3*  HCT 35.2* 33.7*  MCV 90.0 93.1  PLT 268 263   Cardiac Enzymes: No results for input(s): CKTOTAL, CKMB, CKMBINDEX, TROPONINI in the last 168 hours. BNP: Invalid input(s): POCBNP CBG: No results for input(s): GLUCAP in the last 168 hours. HbA1C: No results for input(s):  HGBA1C in the last 72 hours. Urine analysis:    Component Value Date/Time   COLORURINE YELLOW 05/07/2018 0157   APPEARANCEUR TURBID (A) 05/07/2018 0157   LABSPEC 1.008 05/07/2018 0157   PHURINE 9.0 (H) 05/07/2018 0157   GLUCOSEU NEGATIVE 05/07/2018 0157   HGBUR SMALL (A) 05/07/2018 0157   BILIRUBINUR NEGATIVE 05/07/2018 0157   KETONESUR NEGATIVE 05/07/2018 0157   PROTEINUR 100 (A) 05/07/2018 0157   UROBILINOGEN 0.2 05/11/2015 0906   NITRITE NEGATIVE 05/07/2018 0157   LEUKOCYTESUR MODERATE (A) 05/07/2018 0157   Sepsis Labs: @LABRCNTIP (procalcitonin:4,lacticidven:4) ) Recent Results (from the past 240 hour(s))  MRSA PCR Screening     Status: None   Collection Time: 05/07/18 11:38 AM  Result Value Ref Range Status   MRSA by PCR NEGATIVE NEGATIVE Final    Comment:        The GeneXpert MRSA Assay (FDA approved for NASAL specimens only), is one component of a comprehensive MRSA colonization  surveillance program. It is not intended to diagnose MRSA infection nor to guide or monitor treatment for MRSA infections. Performed at [facility], [address], [place], [place] [address]      Scheduled Meds: . Chlorhexidine Gluconate Cloth  6 each Topical Q0600  . heparin  5,000 Units Subcutaneous Q8H  . multivitamin  1 tablet Oral QHS  . pantoprazole  40 mg Oral Daily  . polyethylene glycol  17 g Oral Daily  . senna  2 tablet Oral Daily   Continuous Infusions: . sodium chloride Stopped (05/07/18 0550)  . famotidine (PEPCID) IV Stopped (05/07/18 0550)  . piperacillin-tazobactam (ZOSYN)  IV 3.375 g (05/08/18 0949)    Procedures/Studies: Ct Abdomen Pelvis Wo Contrast  Result Date: 05/07/2018 CLINICAL DATA:  Headache and vomiting since recent fall. EXAM: CT ABDOMEN AND PELVIS WITHOUT CONTRAST TECHNIQUE: Multidetector CT imaging of the abdomen and pelvis was performed following the standard protocol without IV contrast. COMPARISON:  None. FINDINGS: There is generalized anatomic  distortion due to caudal regression syndrome and spina bifida. LOWER CHEST: There is no basilar pleural or apical pericardial effusion. HEPATOBILIARY: Unchanged distorted hepatic contours. No biliary dilatation. The gallbladder is normal. PANCREAS: Difficult to visualize the pancreas well, but no focal abnormality is identified. SPLEEN: Normal. ADRENALS/URINARY TRACT: --Adrenal glands: Not clearly visualized. --Horseshoe kidney: Unchanged chronic hydronephrosis. --Urinary bladder: The urinary bladder shows marked wall thickening, unchanged. STOMACH/BOWEL: Large amount of stool in the rectum proximal to the narrowed pelvic inlet. There is a large colonic stool burden in general. No dilated small bowel. VASCULAR/LYMPHATIC: Limited assessment of the vessels without contrast. No abdominal or pelvic lymphadenopathy. REPRODUCTIVE: Cystic focus in the superior aspect of the gluteal cleft is unchanged. Fat-containing mass in the left lower quadrant is unchanged, measuring 5.9 cm. MUSCULOSKELETAL. 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 There is a narrowed pelvic inlet secondary to medialization of iliac bones. OTHER: None. IMPRESSION: 1. Unchanged findings of caudal regression syndrome and spina bifida. 2. Large colonic stool burden, greatest just proximal to the narrowed pelvic inlet. 3. Chronic hydronephrosis of horseshoe kidney. 4. Unchanged appearance of left ovarian dermoid. 5. Chronic wall thickening of the urinary bladder. Electronically Signed   By: Ulyses Jarred M.D.   On: 05/07/2018 03:56   Ct Head Wo Contrast  Result Date: 05/07/2018 CLINICAL DATA:  Fall with headache.  Vomiting. EXAM: CT HEAD WITHOUT CONTRAST TECHNIQUE: Contiguous axial images were obtained from the base of the skull through the vertex without intravenous contrast. COMPARISON:  Head CT 07/23/2016 FINDINGS: Brain: There is no mass, hemorrhage or extra-axial collection. The size and configuration of the ventricles  and extra-axial CSF spaces are normal. There is no acute or chronic infarction. The brain parenchyma is normal. Vascular: No abnormal hyperdensity of the major intracranial arteries or dural venous sinuses. No intracranial atherosclerosis. Skull: The visualized skull base, calvarium and extracranial soft tissues are normal. Sinuses/Orbits: No fluid levels or advanced mucosal thickening of the visualized paranasal sinuses. No mastoid or middle ear effusion. The orbits are normal. IMPRESSION: Normal head CT. Electronically Signed   By: Ulyses Jarred M.D.   On: 05/07/2018 02:29    Orson Eva, DO  [facility] Pager [phone]  If 7PM-7AM, please contact night-coverage [email or web address] Password [email or web address] 05/08/2018, 12:36 PM   LOS: 0 days

## 2018-05-09 DIAGNOSIS — Q057 Lumbar spina bifida without hydrocephalus: Secondary | ICD-10-CM

## 2018-05-09 DIAGNOSIS — R112 Nausea with vomiting, unspecified: Secondary | ICD-10-CM | POA: Diagnosis not present

## 2018-05-09 DIAGNOSIS — E876 Hypokalemia: Secondary | ICD-10-CM | POA: Diagnosis not present

## 2018-05-09 DIAGNOSIS — Z720 Tobacco use: Secondary | ICD-10-CM | POA: Diagnosis not present

## 2018-05-09 DIAGNOSIS — Z992 Dependence on renal dialysis: Secondary | ICD-10-CM | POA: Diagnosis not present

## 2018-05-09 DIAGNOSIS — N186 End stage renal disease: Secondary | ICD-10-CM | POA: Diagnosis not present

## 2018-05-09 DIAGNOSIS — N390 Urinary tract infection, site not specified: Secondary | ICD-10-CM | POA: Diagnosis not present

## 2018-05-09 LAB — CBC WITH DIFFERENTIAL/PLATELET
ABS IMMATURE GRANULOCYTES: 0.01 10*3/uL (ref 0.00–0.07)
BASOS PCT: 1 %
Basophils Absolute: 0 10*3/uL (ref 0.0–0.1)
Eosinophils Absolute: 0.3 10*3/uL (ref 0.0–0.5)
Eosinophils Relative: 8 %
HCT: 33.4 % — ABNORMAL LOW (ref 36.0–46.0)
HEMOGLOBIN: 9.8 g/dL — AB (ref 12.0–15.0)
Immature Granulocytes: 0 %
Lymphocytes Relative: 31 %
Lymphs Abs: 1.3 10*3/uL (ref 0.7–4.0)
MCH: 27.1 pg (ref 26.0–34.0)
MCHC: 29.3 g/dL — ABNORMAL LOW (ref 30.0–36.0)
MCV: 92.5 fL (ref 80.0–100.0)
MONO ABS: 0.5 10*3/uL (ref 0.1–1.0)
MONOS PCT: 12 %
NEUTROS ABS: 2 10*3/uL (ref 1.7–7.7)
Neutrophils Relative %: 48 %
PLATELETS: 252 10*3/uL (ref 150–400)
RBC: 3.61 MIL/uL — ABNORMAL LOW (ref 3.87–5.11)
RDW: 15.2 % (ref 11.5–15.5)
WBC: 4.2 10*3/uL (ref 4.0–10.5)
nRBC: 0 % (ref 0.0–0.2)

## 2018-05-09 LAB — RENAL FUNCTION PANEL
ANION GAP: 13 (ref 5–15)
Albumin: 3.5 g/dL (ref 3.5–5.0)
BUN: 35 mg/dL — ABNORMAL HIGH (ref 6–20)
CO2: 28 mmol/L (ref 22–32)
Calcium: 10 mg/dL (ref 8.9–10.3)
Chloride: 98 mmol/L (ref 98–111)
Creatinine, Ser: 5.5 mg/dL — ABNORMAL HIGH (ref 0.44–1.00)
GFR calc non Af Amer: 10 mL/min — ABNORMAL LOW (ref >60)
GFR, EST AFRICAN AMERICAN: 11 mL/min — AB (ref >60)
Glucose, Bld: 85 mg/dL (ref 70–99)
POTASSIUM: 4.2 mmol/L (ref 3.5–5.1)
Phosphorus: 9 mg/dL — ABNORMAL HIGH (ref 2.5–4.6)
Sodium: 139 mmol/L (ref 135–145)

## 2018-05-09 LAB — HIV ANTIBODY (ROUTINE TESTING W REFLEX): HIV Screen 4th Generation wRfx: NONREACTIVE

## 2018-05-09 MED ORDER — LINACLOTIDE 72 MCG PO CAPS: 72.0000 ug | ORAL_CAPSULE | Freq: Every day | ORAL | 0 refills | Status: DC

## 2018-05-09 MED ORDER — FAMOTIDINE 20 MG PO TABS: 20.0000 mg | ORAL_TABLET | Freq: Every day | ORAL | Status: DC

## 2018-05-09 MED ORDER — AMOXICILLIN-POT CLAVULANATE 500-125 MG PO TABS: 1.0000 | ORAL_TABLET | Freq: Every day | ORAL | 0 refills | Status: DC

## 2018-05-09 NOTE — Progress Notes (Signed)
Pt's IV catheter removed and intact. Pt's IV site clean dry and intact. Discharge instructions including medications and follow up appointments were reviewed and discussed with patient. All questions were answered and no further questions at this time. Pt in stable condition and and in no acute distress at time of discharge. Pt will be escorted by nurse tech.

## 2018-05-09 NOTE — Progress Notes (Signed)
Subjective: Interval History: Patient is feeling much better today.  She did not have any nausea or vomiting.  No abdominal pain.  Objective: Vital signs in last 24 hours: Temp:  [98 F (36.7 C)-98.5 F (36.9 C)] 98.2 F (36.8 C) (10/13 0550) Pulse Rate:  [83-98] 89 (10/13 0550) Resp:  [14-20] 14 (10/13 0550) BP: (114-135)/(69-81) 130/78 (10/13 0550) SpO2:  [100 %] 100 % (10/13 0550) Weight:  [28.9 kg] 28.9 kg (10/12 1435) Weight change: 0.4 kg  Intake/Output from previous day: 10/12 0701 - 10/13 0700 In: 749.5 [P.O.:720; IV Piggyback:29.5] Out: -185  Intake/Output this shift: No intake/output data recorded.  General appearance: alert, cooperative and no distress Resp: clear to auscultation bilaterally Cardio: regular rate and rhythm Extremities: No edema  Lab Results: Recent Labs    05/08/18 0500 05/09/18 0554  WBC 4.1 4.2  HGB 10.3* 9.8*  HCT 33.7* 33.4*  PLT 263 252   BMET:  Recent Labs    05/08/18 0500 05/09/18 0554  NA 140 139  K 4.1 4.2  CL 99 98  CO2 27 28  GLUCOSE 86 85  BUN 50* 35*  CREATININE 7.29* 5.50*  CALCIUM 10.3 10.0   No results for input(s): PTH in the last 72 hours. Iron Studies: No results for input(s): IRON, TIBC, TRANSFERRIN, FERRITIN in the last 72 hours.  Studies/Results: No results found.  I have reviewed the patient's current medications.  Assessment/Plan: 1] nausea and vomiting.  Presently is feeling much better.  She offers no complaint.  She want to eat regular food. 2] end-stage renal disease: She is status post hemodialysis yesterday for 2 hours and 15 minutes.  Patient did not finish the last 45 minutes because of headache.  Presently she is a symptomatic. 3] UTI: Patient on antibiotics.  Patient is afebrile and her white blood cell count is normal.  She denies any chills or sweating. 5] bone and mineral disorder: Calcium is range.  Phosphorus is high.  Patient is not getting any binder because she is not eating now.  She  told me she was on [place] as an outpatient. 6] fluid management: No significant sign of fluid overload 7] spinal bifida Plan: 1] patient does not need dialysis today and her next dialysis would be on Tuesday which is her regular schedule. 2] we will start on a binder once patient start eating regular food. 3] we will check a renal panel and CBC in the morning.     LOS: 0 days   Reeder Brisby S 05/09/2018,9:41 AM

## 2018-05-09 NOTE — Progress Notes (Signed)
PHARMACIST - PHYSICIAN COMMUNICATION  DR:   Tat  CONCERNING: IV- to-Oral Route Change Policy  RECOMMENDATION: This patient is receiving famotidine by the intravenous route.  Based on criteria approved by the Pharmacy and Therapeutics Committee, the intravenous medication(s) is/are being converted to the equivalent oral dose form(s).   DESCRIPTION: These criteria include:  The patient is eating (either orally or via tube) and/or has been taking other orally administered medications for a least 24 hours  The patient has no evidence of active gastrointestinal bleeding or impaired GI absorption (gastrectomy, short bowel, patient on TNA or NPO).  If you have questions about this conversion, please contact the Pharmacy Department  [x]   [phone] )  [facility] []   [phone] )  [facility] []   [phone] )  [facility] []   [phone] )  [facility] []   [phone] )  [place], [facility] 05/09/2018 9:46 AM

## 2018-05-09 NOTE — Discharge Summary (Addendum)
Physician Discharge Summary  [name] NLZ:[id number] DOB: Sep 10, 1990 DOA: 05/07/2018  PCP: Lucianne Lei, MD  Admit date: 05/07/2018 Discharge date: 05/09/2018  Admitted From: Home Disposition:  Home   Recommendations for Outpatient Follow-up:  1. Follow up with PCP in 1-2 weeks 2. Please obtain BMP/CBC in one week   Discharge Condition: Stable CODE STATUS: FULL Diet recommendation: renal   Brief/Interim Summary: 27 year old female with a history of ESRD(T-Th-S), spina bifida, spinal/caudal regression syndrome, GERD, hypertension, depression presenting with headache and nausea and vomiting with coffee grounds. Patient states that she had a mechanical fall out of her wheelchair on 05/03/2018 and hit her head. She has had a frontal headache with photophobia since that time. Beginning on 05/05/2018, the patient began having multiple episodes of nausea and vomiting. She states that she had 3 episodes of emesis prior to coming to the emergency department. She had noted some black material as well as small amounts of bright red blood. Because of emesis and associate abdominal pain, the patient presented for further evaluation. The patient states that she normally has 1 bowel movement per week. She does not use any medications or stool softeners to assist with bowel movements. She was previously seen by GI in June 2018 who prescribed the patient Linzess, but the patient ran out of samples 2 weeks ago. The patient denies any fevers, chills, chest pain, shortness breath. There is no hematochezia, melena. The patient self catheterizes herself when she feels she has to urinate. She states that she self catheterizes to 3 times per day. She denies any focal extremity weakness or visual disturbance. She denies any NSAID use or alcohol use. She denies any changes in her medications. In the emergency department, the patient was noted to have WBC 12.7 but was afebrile hemodynamically  stable saturating 100% on room air. 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 There is marked thickening of the urinary bladder. There is a large stool burden greatest at the narrowed pelvic and left. There is an unchanged left ovarian dermoid cyst. Patient was given a fleets enema with one bowel movement. Her abdominal pain improved. However she continued to fail week. As result, the patient was admitted for further evaluation. The patient was alsogiven Zosyn, Compazine, Phenergan with some improvement of her nausea and vomiting.  Discharge Diagnoses:  Intractable nausea and vomiting -Likely due to combination of constipation and possible UTI -Started PPI -diet slowly advanced -Zofran PRN -diet was advanced which pt tolerated  Constipation -Patient had bowel movement after a fleets enema -Start MiraLAX and Senokot daily -restart Linzess--pt states she ran out of samples at home-->+multiple BMs after starting Linzess  Pyuria -doubt if pt has true UTI in ESRD pt with oliguria -she has been afebrile and hemodynamically stable without dysuria  -she has had innumerable urine cultures with Pseudomonas, EColi, viridans strep and Citrobacter dating back to July 2014 which all likely represent colonization at this point -Patient states that she still self catheterizes--I question proper technique -UA >50 WBC -prelim culture = Pseudomonas, but as stated above I doubt if this is true invasive infection; suspect colonization -Pt started on zosyn initially -d/c home with amox/clav 500/125 mg once daily x 5 days  ESRD -Last dialyzed 05/06/2018 prior to hospitalization -renal consulted and orders written for HD x 3 hours-->pt signed off AMA 45 min early on 05/08/18  Essential hypertension -She has not had any hypertensive medications presently -Monitor clinically  Hypokalemia -  In a patient with ESRD and mild  hypokalemia, monitor clinically without repletion at this point  Spinal/caudal regression syndrome with spina bifida -PT evaluation  Tobacco abuse -Tobacco cessation discussed    Discharge Instructions   Allergies as of 05/09/2018      Reactions   Ciprofloxacin Shortness Of Breath, Nausea And Vomiting, Other (See Comments)   HIGH FEVER and oral blisters    Other Anaphylaxis   Revaclear dialzer   Peanut-containing Drug Products Anaphylaxis   Aleve [naproxen Sodium] Other (See Comments)   G.I.Bleed   Ceftriaxone Other (See Comments)   Blisters in mouth   Coconut Oil Hives   Influenza Vaccines Nausea And Vomiting, Other (See Comments)   High fever   Tetanus Toxoid, Adsorbed Nausea And Vomiting, Other (See Comments)   HIGH FEVER, also   Tetanus Toxoids Nausea And Vomiting, Other (See Comments)   HIGH FEVER   Latex Itching, Rash      Medication List    TAKE these medications   amoxicillin-clavulanate 500-125 MG tablet Commonly known as:  AUGMENTIN Take 1 tablet (500 mg total) by mouth daily.   AURYXIA 1 GM 210 MG(Fe) tablet Generic drug:  ferric citrate Take 2 tablets by mouth 3 (three) times daily with meals.   EPOGEN IJ Inject as directed See admin instructions. Every Tuesday, Thursday, Saturday   linaclotide 72 MCG capsule Commonly known as:  LINZESS Take 1 capsule (72 mcg total) by mouth daily before breakfast. Start taking on:  05/10/2018   multivitamin Tabs tablet Take 1 tablet by mouth See admin instructions. Every Tuesday, Thursday, Saturday   omeprazole 20 MG capsule Commonly known as:  PRILOSEC Take 20 mg by mouth 2 (two) times daily before a meal.       Allergies  Allergen Reactions  . Ciprofloxacin Shortness Of Breath, Nausea And Vomiting and Other (See Comments)    HIGH FEVER and oral blisters   . Other Anaphylaxis    Revaclear dialzer  . Peanut-Containing Drug Products Anaphylaxis  . Aleve [Naproxen Sodium] Other (See Comments)     G.I.Bleed  . Ceftriaxone Other (See Comments)    Blisters in mouth   . Coconut Oil Hives  . Influenza Vaccines Nausea And Vomiting and Other (See Comments)    High fever  . Tetanus Toxoid, Adsorbed Nausea And Vomiting and Other (See Comments)    HIGH FEVER, also  . Tetanus Toxoids Nausea And Vomiting and Other (See Comments)    HIGH FEVER  . Latex Itching and Rash    Consultations:  renal   Procedures/Studies: Ct Abdomen Pelvis Wo Contrast  Result Date: 05/07/2018 CLINICAL DATA:  Headache and vomiting since recent fall. EXAM: CT ABDOMEN AND PELVIS WITHOUT CONTRAST TECHNIQUE: Multidetector CT imaging of the abdomen and pelvis was performed following the standard protocol without IV contrast. COMPARISON:  None. FINDINGS: There is generalized anatomic distortion due to caudal regression syndrome and spina bifida. LOWER CHEST: There is no basilar pleural or apical pericardial effusion. HEPATOBILIARY: Unchanged distorted hepatic contours. No biliary dilatation. The gallbladder is normal. PANCREAS: Difficult to visualize the pancreas well, but no focal abnormality is identified. SPLEEN: Normal. ADRENALS/URINARY TRACT: --Adrenal glands: Not clearly visualized. --Horseshoe kidney: Unchanged chronic hydronephrosis. --Urinary bladder: The urinary bladder shows marked wall thickening, unchanged. STOMACH/BOWEL: Large amount of stool in the rectum proximal to the narrowed pelvic inlet. There is a large colonic stool burden in general. No dilated small bowel. VASCULAR/LYMPHATIC: Limited assessment of the vessels without contrast. No abdominal or pelvic lymphadenopathy. REPRODUCTIVE:  Cystic focus in the superior aspect of the gluteal cleft is unchanged. Fat-containing mass in the left lower quadrant is unchanged, measuring 5.9 cm. MUSCULOSKELETAL. 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 There is a narrowed pelvic inlet secondary to medialization of iliac bones. OTHER: None.  IMPRESSION: 1. Unchanged findings of caudal regression syndrome and spina bifida. 2. Large colonic stool burden, greatest just proximal to the narrowed pelvic inlet. 3. Chronic hydronephrosis of horseshoe kidney. 4. Unchanged appearance of left ovarian dermoid. 5. Chronic wall thickening of the urinary bladder. Electronically Signed   By: Ulyses Jarred M.D.   On: 05/07/2018 03:56   Ct Head Wo Contrast  Result Date: 05/07/2018 CLINICAL DATA:  Fall with headache.  Vomiting. EXAM: CT HEAD WITHOUT CONTRAST TECHNIQUE: Contiguous axial images were obtained from the base of the skull through the vertex without intravenous contrast. COMPARISON:  Head CT 07/23/2016 FINDINGS: Brain: There is no mass, hemorrhage or extra-axial collection. The size and configuration of the ventricles and extra-axial CSF spaces are normal. There is no acute or chronic infarction. The brain parenchyma is normal. Vascular: No abnormal hyperdensity of the major intracranial arteries or dural venous sinuses. No intracranial atherosclerosis. Skull: The visualized skull base, calvarium and extracranial soft tissues are normal. Sinuses/Orbits: No fluid levels or advanced mucosal thickening of the visualized paranasal sinuses. No mastoid or middle ear effusion. The orbits are normal. IMPRESSION: Normal head CT. Electronically Signed   By: Ulyses Jarred M.D.   On: 05/07/2018 02:29        Discharge Exam: Vitals:   05/08/18 2245 05/09/18 0550  BP: 125/81 130/78  Pulse: 98 89  Resp: 20 14  Temp: 98 F (36.7 C) 98.2 F (36.8 C)  SpO2: 100% 100%   Vitals:   05/08/18 1645 05/08/18 1700 05/08/18 2245 05/09/18 0550  BP: 134/70 130/72 125/81 130/78  Pulse: 97 95 98 89  Resp:   20 14  Temp:   98 F (36.7 C) 98.2 F (36.8 C)  TempSrc:   Oral Oral  SpO2:   100% 100%  Weight:        General: Pt is alert, awake, not in acute distress Cardiovascular: RRR, S1/S2 +, no rubs, no gallops Respiratory: CTA bilaterally, no wheezing, no  rhonchi Abdominal: Soft, NT, ND, bowel sounds + Extremities: no edema, no cyanosis   The results of significant diagnostics from this hospitalization (including imaging, microbiology, ancillary and laboratory) are listed below for reference.    Significant Diagnostic Studies: Ct Abdomen Pelvis Wo Contrast  Result Date: 05/07/2018 CLINICAL DATA:  Headache and vomiting since recent fall. EXAM: CT ABDOMEN AND PELVIS WITHOUT CONTRAST TECHNIQUE: Multidetector CT imaging of the abdomen and pelvis was performed following the standard protocol without IV contrast. COMPARISON:  None. FINDINGS: There is generalized anatomic distortion due to caudal regression syndrome and spina bifida. LOWER CHEST: There is no basilar pleural or apical pericardial effusion. HEPATOBILIARY: Unchanged distorted hepatic contours. No biliary dilatation. The gallbladder is normal. PANCREAS: Difficult to visualize the pancreas well, but no focal abnormality is identified. SPLEEN: Normal. ADRENALS/URINARY TRACT: --Adrenal glands: Not clearly visualized. --Horseshoe kidney: Unchanged chronic hydronephrosis. --Urinary bladder: The urinary bladder shows marked wall thickening, unchanged. STOMACH/BOWEL: Large amount of stool in the rectum proximal to the narrowed pelvic inlet. There is a large colonic stool burden in general. No dilated small bowel. VASCULAR/LYMPHATIC: Limited assessment of the vessels without contrast. No abdominal or pelvic lymphadenopathy. REPRODUCTIVE: Cystic focus in the superior aspect of the gluteal  cleft is unchanged. Fat-containing mass in the left lower quadrant is unchanged, measuring 5.9 cm. MUSCULOSKELETAL. 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 There is a narrowed pelvic inlet secondary to medialization of iliac bones. OTHER: None. IMPRESSION: 1. Unchanged findings of caudal regression syndrome and spina bifida. 2. Large colonic stool burden, greatest just proximal to the narrowed  pelvic inlet. 3. Chronic hydronephrosis of horseshoe kidney. 4. Unchanged appearance of left ovarian dermoid. 5. Chronic wall thickening of the urinary bladder. Electronically Signed   By: Ulyses Jarred M.D.   On: 05/07/2018 03:56   Ct Head Wo Contrast  Result Date: 05/07/2018 CLINICAL DATA:  Fall with headache.  Vomiting. EXAM: CT HEAD WITHOUT CONTRAST TECHNIQUE: Contiguous axial images were obtained from the base of the skull through the vertex without intravenous contrast. COMPARISON:  Head CT 07/23/2016 FINDINGS: Brain: There is no mass, hemorrhage or extra-axial collection. The size and configuration of the ventricles and extra-axial CSF spaces are normal. There is no acute or chronic infarction. The brain parenchyma is normal. Vascular: No abnormal hyperdensity of the major intracranial arteries or dural venous sinuses. No intracranial atherosclerosis. Skull: The visualized skull base, calvarium and extracranial soft tissues are normal. Sinuses/Orbits: No fluid levels or advanced mucosal thickening of the visualized paranasal sinuses. No mastoid or middle ear effusion. The orbits are normal. IMPRESSION: Normal head CT. Electronically Signed   By: Ulyses Jarred M.D.   On: 05/07/2018 02:29     Microbiology: Recent Results (from the past 240 hour(s))  Urine Culture     Status: None (Preliminary result)   Collection Time: 05/07/18  2:37 AM  Result Value Ref Range Status   Specimen Description   Final    URINE, RANDOM Performed at [facility], [address] [place], [place] [address]    Special Requests   Final    NONE Performed at [facility], [address] [place], [place] [address]    Culture   Final    CULTURE REINCUBATED FOR BETTER GROWTH Performed at [facility], [address], [place], [place] [address]    Report Status PENDING  Incomplete  MRSA PCR Screening     Status: None   Collection Time: 05/07/18 11:38 AM  Result Value Ref Range Status   MRSA by PCR NEGATIVE  NEGATIVE Final    Comment:        The GeneXpert MRSA Assay (FDA approved for NASAL specimens only), is one component of a comprehensive MRSA colonization surveillance program. It is not intended to diagnose MRSA infection nor to guide or monitor treatment for MRSA infections. Performed at [facility], [address] [place], [place] [address]      Labs: Basic Metabolic Panel: Recent Labs  Lab 05/07/18 0157 05/08/18 0500 05/09/18 0554  NA 140 140 139  K 3.4* 4.1 4.2  CL 98 99 98  CO2 28 27 28   GLUCOSE 104* 86 85  BUN 33* 50* 35*  CREATININE 5.09* 7.29* 5.50*  CALCIUM 10.0 10.3 10.0  MG  --  2.5*  --   PHOS  --  9.1* 9.0*   Liver Function Tests: Recent Labs  Lab 05/07/18 0157 05/08/18 0500 05/09/18 0554  AST 14*  --   --   ALT 10  --   --   ALKPHOS 46  --   --   BILITOT 0.6  --   --   PROT 8.7*  --   --   ALBUMIN 3.9 3.5  3.5   Recent Labs  Lab 05/07/18 0157  LIPASE 49   No results for input(s): AMMONIA in the last 168 hours. CBC: Recent Labs  Lab 05/07/18 0157 05/08/18 0500 05/09/18 0554  WBC 12.7* 4.1 4.2  NEUTROABS 10.2* 1.4* 2.0  HGB 10.6* 10.3* 9.8*  HCT 35.2* 33.7* 33.4*  MCV 90.0 93.1 92.5  PLT 268 263 252   Cardiac Enzymes: No results for input(s): CKTOTAL, CKMB, CKMBINDEX, TROPONINI in the last 168 hours. BNP: Invalid input(s): POCBNP CBG: No results for input(s): GLUCAP in the last 168 hours.  Time coordinating discharge:  36 minutes  Signed:  Orson Eva, DO [facility] Pager: [phone] 05/09/2018, 2:00 PM

## 2018-05-11 DIAGNOSIS — N2581 Secondary hyperparathyroidism of renal origin: Secondary | ICD-10-CM | POA: Diagnosis not present

## 2018-05-11 DIAGNOSIS — N186 End stage renal disease: Secondary | ICD-10-CM | POA: Diagnosis not present

## 2018-05-11 DIAGNOSIS — D631 Anemia in chronic kidney disease: Secondary | ICD-10-CM | POA: Diagnosis not present

## 2018-05-13 DIAGNOSIS — N2581 Secondary hyperparathyroidism of renal origin: Secondary | ICD-10-CM | POA: Diagnosis not present

## 2018-05-13 DIAGNOSIS — N186 End stage renal disease: Secondary | ICD-10-CM | POA: Diagnosis not present

## 2018-05-13 DIAGNOSIS — E039 Hypothyroidism, unspecified: Secondary | ICD-10-CM | POA: Diagnosis not present

## 2018-05-13 DIAGNOSIS — D631 Anemia in chronic kidney disease: Secondary | ICD-10-CM | POA: Diagnosis not present

## 2018-05-15 DIAGNOSIS — D631 Anemia in chronic kidney disease: Secondary | ICD-10-CM | POA: Diagnosis not present

## 2018-05-15 DIAGNOSIS — N2581 Secondary hyperparathyroidism of renal origin: Secondary | ICD-10-CM | POA: Diagnosis not present

## 2018-05-15 DIAGNOSIS — N186 End stage renal disease: Secondary | ICD-10-CM | POA: Diagnosis not present

## 2018-05-16 LAB — SUSCEPTIBILITY, AER + ANAEROB

## 2018-05-16 LAB — SUSCEPTIBILITY RESULT

## 2018-05-18 DIAGNOSIS — D631 Anemia in chronic kidney disease: Secondary | ICD-10-CM | POA: Diagnosis not present

## 2018-05-18 DIAGNOSIS — N186 End stage renal disease: Secondary | ICD-10-CM | POA: Diagnosis not present

## 2018-05-18 DIAGNOSIS — N2581 Secondary hyperparathyroidism of renal origin: Secondary | ICD-10-CM | POA: Diagnosis not present

## 2018-05-20 DIAGNOSIS — D631 Anemia in chronic kidney disease: Secondary | ICD-10-CM | POA: Diagnosis not present

## 2018-05-20 DIAGNOSIS — N2581 Secondary hyperparathyroidism of renal origin: Secondary | ICD-10-CM | POA: Diagnosis not present

## 2018-05-20 DIAGNOSIS — N186 End stage renal disease: Secondary | ICD-10-CM | POA: Diagnosis not present

## 2018-05-21 LAB — URINE CULTURE: Culture: >=100000 — AB

## 2018-05-22 DIAGNOSIS — D631 Anemia in chronic kidney disease: Secondary | ICD-10-CM | POA: Diagnosis not present

## 2018-05-22 DIAGNOSIS — N186 End stage renal disease: Secondary | ICD-10-CM | POA: Diagnosis not present

## 2018-05-22 DIAGNOSIS — N2581 Secondary hyperparathyroidism of renal origin: Secondary | ICD-10-CM | POA: Diagnosis not present

## 2018-05-25 DIAGNOSIS — D631 Anemia in chronic kidney disease: Secondary | ICD-10-CM | POA: Diagnosis not present

## 2018-05-25 DIAGNOSIS — N2581 Secondary hyperparathyroidism of renal origin: Secondary | ICD-10-CM | POA: Diagnosis not present

## 2018-05-25 DIAGNOSIS — N186 End stage renal disease: Secondary | ICD-10-CM | POA: Diagnosis not present

## 2018-05-27 DIAGNOSIS — D631 Anemia in chronic kidney disease: Secondary | ICD-10-CM | POA: Diagnosis not present

## 2018-05-27 DIAGNOSIS — N186 End stage renal disease: Secondary | ICD-10-CM | POA: Diagnosis not present

## 2018-05-27 DIAGNOSIS — N2581 Secondary hyperparathyroidism of renal origin: Secondary | ICD-10-CM | POA: Diagnosis not present

## 2018-05-28 DIAGNOSIS — Z992 Dependence on renal dialysis: Secondary | ICD-10-CM | POA: Diagnosis not present

## 2018-05-28 DIAGNOSIS — N186 End stage renal disease: Secondary | ICD-10-CM | POA: Diagnosis not present

## 2018-05-28 DIAGNOSIS — I120 Hypertensive chronic kidney disease with stage 5 chronic kidney disease or end stage renal disease: Secondary | ICD-10-CM | POA: Diagnosis not present

## 2018-05-29 DIAGNOSIS — N186 End stage renal disease: Secondary | ICD-10-CM | POA: Diagnosis not present

## 2018-05-29 DIAGNOSIS — N2581 Secondary hyperparathyroidism of renal origin: Secondary | ICD-10-CM | POA: Diagnosis not present

## 2018-06-01 DIAGNOSIS — N186 End stage renal disease: Secondary | ICD-10-CM | POA: Diagnosis not present

## 2018-06-01 DIAGNOSIS — N2581 Secondary hyperparathyroidism of renal origin: Secondary | ICD-10-CM | POA: Diagnosis not present

## 2018-06-03 DIAGNOSIS — N186 End stage renal disease: Secondary | ICD-10-CM | POA: Diagnosis not present

## 2018-06-03 DIAGNOSIS — N2581 Secondary hyperparathyroidism of renal origin: Secondary | ICD-10-CM | POA: Diagnosis not present

## 2018-06-05 DIAGNOSIS — N2581 Secondary hyperparathyroidism of renal origin: Secondary | ICD-10-CM | POA: Diagnosis not present

## 2018-06-05 DIAGNOSIS — N186 End stage renal disease: Secondary | ICD-10-CM | POA: Diagnosis not present

## 2018-06-10 DIAGNOSIS — N2581 Secondary hyperparathyroidism of renal origin: Secondary | ICD-10-CM | POA: Diagnosis not present

## 2018-06-10 DIAGNOSIS — N186 End stage renal disease: Secondary | ICD-10-CM | POA: Diagnosis not present

## 2018-06-12 DIAGNOSIS — N2581 Secondary hyperparathyroidism of renal origin: Secondary | ICD-10-CM | POA: Diagnosis not present

## 2018-06-12 DIAGNOSIS — N186 End stage renal disease: Secondary | ICD-10-CM | POA: Diagnosis not present

## 2018-06-15 DIAGNOSIS — N2581 Secondary hyperparathyroidism of renal origin: Secondary | ICD-10-CM | POA: Diagnosis not present

## 2018-06-15 DIAGNOSIS — N186 End stage renal disease: Secondary | ICD-10-CM | POA: Diagnosis not present

## 2018-06-17 DIAGNOSIS — N2581 Secondary hyperparathyroidism of renal origin: Secondary | ICD-10-CM | POA: Diagnosis not present

## 2018-06-17 DIAGNOSIS — N186 End stage renal disease: Secondary | ICD-10-CM | POA: Diagnosis not present

## 2018-06-19 DIAGNOSIS — N186 End stage renal disease: Secondary | ICD-10-CM | POA: Diagnosis not present

## 2018-06-19 DIAGNOSIS — N2581 Secondary hyperparathyroidism of renal origin: Secondary | ICD-10-CM | POA: Diagnosis not present

## 2018-06-21 DIAGNOSIS — N2581 Secondary hyperparathyroidism of renal origin: Secondary | ICD-10-CM | POA: Diagnosis not present

## 2018-06-21 DIAGNOSIS — N186 End stage renal disease: Secondary | ICD-10-CM | POA: Diagnosis not present

## 2018-06-23 DIAGNOSIS — N2581 Secondary hyperparathyroidism of renal origin: Secondary | ICD-10-CM | POA: Diagnosis not present

## 2018-06-23 DIAGNOSIS — N186 End stage renal disease: Secondary | ICD-10-CM | POA: Diagnosis not present

## 2018-06-26 DIAGNOSIS — N2581 Secondary hyperparathyroidism of renal origin: Secondary | ICD-10-CM | POA: Diagnosis not present

## 2018-06-26 DIAGNOSIS — N186 End stage renal disease: Secondary | ICD-10-CM | POA: Diagnosis not present

## 2018-06-27 DIAGNOSIS — N186 End stage renal disease: Secondary | ICD-10-CM | POA: Diagnosis not present

## 2018-06-27 DIAGNOSIS — I120 Hypertensive chronic kidney disease with stage 5 chronic kidney disease or end stage renal disease: Secondary | ICD-10-CM | POA: Diagnosis not present

## 2018-06-27 DIAGNOSIS — Z992 Dependence on renal dialysis: Secondary | ICD-10-CM | POA: Diagnosis not present

## 2018-06-29 DIAGNOSIS — D631 Anemia in chronic kidney disease: Secondary | ICD-10-CM | POA: Diagnosis not present

## 2018-06-29 DIAGNOSIS — N186 End stage renal disease: Secondary | ICD-10-CM | POA: Diagnosis not present

## 2018-06-29 DIAGNOSIS — N2581 Secondary hyperparathyroidism of renal origin: Secondary | ICD-10-CM | POA: Diagnosis not present

## 2018-07-01 DIAGNOSIS — N186 End stage renal disease: Secondary | ICD-10-CM | POA: Diagnosis not present

## 2018-07-01 DIAGNOSIS — D631 Anemia in chronic kidney disease: Secondary | ICD-10-CM | POA: Diagnosis not present

## 2018-07-01 DIAGNOSIS — N2581 Secondary hyperparathyroidism of renal origin: Secondary | ICD-10-CM | POA: Diagnosis not present

## 2018-07-03 DIAGNOSIS — N2581 Secondary hyperparathyroidism of renal origin: Secondary | ICD-10-CM | POA: Diagnosis not present

## 2018-07-03 DIAGNOSIS — D631 Anemia in chronic kidney disease: Secondary | ICD-10-CM | POA: Diagnosis not present

## 2018-07-03 DIAGNOSIS — N186 End stage renal disease: Secondary | ICD-10-CM | POA: Diagnosis not present

## 2018-07-06 DIAGNOSIS — D631 Anemia in chronic kidney disease: Secondary | ICD-10-CM | POA: Diagnosis not present

## 2018-07-06 DIAGNOSIS — N186 End stage renal disease: Secondary | ICD-10-CM | POA: Diagnosis not present

## 2018-07-06 DIAGNOSIS — N2581 Secondary hyperparathyroidism of renal origin: Secondary | ICD-10-CM | POA: Diagnosis not present

## 2018-07-08 DIAGNOSIS — N2581 Secondary hyperparathyroidism of renal origin: Secondary | ICD-10-CM | POA: Diagnosis not present

## 2018-07-08 DIAGNOSIS — N186 End stage renal disease: Secondary | ICD-10-CM | POA: Diagnosis not present

## 2018-07-08 DIAGNOSIS — D631 Anemia in chronic kidney disease: Secondary | ICD-10-CM | POA: Diagnosis not present

## 2018-07-10 DIAGNOSIS — N186 End stage renal disease: Secondary | ICD-10-CM | POA: Diagnosis not present

## 2018-07-10 DIAGNOSIS — N2581 Secondary hyperparathyroidism of renal origin: Secondary | ICD-10-CM | POA: Diagnosis not present

## 2018-07-10 DIAGNOSIS — D631 Anemia in chronic kidney disease: Secondary | ICD-10-CM | POA: Diagnosis not present

## 2018-07-13 DIAGNOSIS — N186 End stage renal disease: Secondary | ICD-10-CM | POA: Diagnosis not present

## 2018-07-13 DIAGNOSIS — D631 Anemia in chronic kidney disease: Secondary | ICD-10-CM | POA: Diagnosis not present

## 2018-07-13 DIAGNOSIS — N2581 Secondary hyperparathyroidism of renal origin: Secondary | ICD-10-CM | POA: Diagnosis not present

## 2018-07-15 DIAGNOSIS — N2581 Secondary hyperparathyroidism of renal origin: Secondary | ICD-10-CM | POA: Diagnosis not present

## 2018-07-15 DIAGNOSIS — N186 End stage renal disease: Secondary | ICD-10-CM | POA: Diagnosis not present

## 2018-07-15 DIAGNOSIS — D631 Anemia in chronic kidney disease: Secondary | ICD-10-CM | POA: Diagnosis not present

## 2018-07-17 DIAGNOSIS — D631 Anemia in chronic kidney disease: Secondary | ICD-10-CM | POA: Diagnosis not present

## 2018-07-17 DIAGNOSIS — N2581 Secondary hyperparathyroidism of renal origin: Secondary | ICD-10-CM | POA: Diagnosis not present

## 2018-07-17 DIAGNOSIS — N186 End stage renal disease: Secondary | ICD-10-CM | POA: Diagnosis not present

## 2018-07-19 DIAGNOSIS — N2581 Secondary hyperparathyroidism of renal origin: Secondary | ICD-10-CM | POA: Diagnosis not present

## 2018-07-19 DIAGNOSIS — D631 Anemia in chronic kidney disease: Secondary | ICD-10-CM | POA: Diagnosis not present

## 2018-07-19 DIAGNOSIS — N186 End stage renal disease: Secondary | ICD-10-CM | POA: Diagnosis not present

## 2018-07-22 DIAGNOSIS — D631 Anemia in chronic kidney disease: Secondary | ICD-10-CM | POA: Diagnosis not present

## 2018-07-22 DIAGNOSIS — N2581 Secondary hyperparathyroidism of renal origin: Secondary | ICD-10-CM | POA: Diagnosis not present

## 2018-07-22 DIAGNOSIS — N186 End stage renal disease: Secondary | ICD-10-CM | POA: Diagnosis not present

## 2018-07-26 DIAGNOSIS — N186 End stage renal disease: Secondary | ICD-10-CM | POA: Diagnosis not present

## 2018-07-26 DIAGNOSIS — N2581 Secondary hyperparathyroidism of renal origin: Secondary | ICD-10-CM | POA: Diagnosis not present

## 2018-07-26 DIAGNOSIS — D631 Anemia in chronic kidney disease: Secondary | ICD-10-CM | POA: Diagnosis not present

## 2018-07-28 DIAGNOSIS — I120 Hypertensive chronic kidney disease with stage 5 chronic kidney disease or end stage renal disease: Secondary | ICD-10-CM | POA: Diagnosis not present

## 2018-07-28 DIAGNOSIS — Z992 Dependence on renal dialysis: Secondary | ICD-10-CM | POA: Diagnosis not present

## 2018-07-28 DIAGNOSIS — N186 End stage renal disease: Secondary | ICD-10-CM | POA: Diagnosis not present

## 2018-07-29 DIAGNOSIS — D631 Anemia in chronic kidney disease: Secondary | ICD-10-CM | POA: Diagnosis not present

## 2018-07-29 DIAGNOSIS — N2581 Secondary hyperparathyroidism of renal origin: Secondary | ICD-10-CM | POA: Diagnosis not present

## 2018-07-29 DIAGNOSIS — N390 Urinary tract infection, site not specified: Secondary | ICD-10-CM | POA: Diagnosis not present

## 2018-07-29 DIAGNOSIS — N186 End stage renal disease: Secondary | ICD-10-CM | POA: Diagnosis not present

## 2018-07-31 DIAGNOSIS — N2581 Secondary hyperparathyroidism of renal origin: Secondary | ICD-10-CM | POA: Diagnosis not present

## 2018-07-31 DIAGNOSIS — D631 Anemia in chronic kidney disease: Secondary | ICD-10-CM | POA: Diagnosis not present

## 2018-07-31 DIAGNOSIS — N390 Urinary tract infection, site not specified: Secondary | ICD-10-CM | POA: Diagnosis not present

## 2018-07-31 DIAGNOSIS — N186 End stage renal disease: Secondary | ICD-10-CM | POA: Diagnosis not present

## 2018-08-03 DIAGNOSIS — N186 End stage renal disease: Secondary | ICD-10-CM | POA: Diagnosis not present

## 2018-08-03 DIAGNOSIS — N2581 Secondary hyperparathyroidism of renal origin: Secondary | ICD-10-CM | POA: Diagnosis not present

## 2018-08-03 DIAGNOSIS — D631 Anemia in chronic kidney disease: Secondary | ICD-10-CM | POA: Diagnosis not present

## 2018-08-03 DIAGNOSIS — N390 Urinary tract infection, site not specified: Secondary | ICD-10-CM | POA: Diagnosis not present

## 2018-08-05 DIAGNOSIS — N186 End stage renal disease: Secondary | ICD-10-CM | POA: Diagnosis not present

## 2018-08-05 DIAGNOSIS — D631 Anemia in chronic kidney disease: Secondary | ICD-10-CM | POA: Diagnosis not present

## 2018-08-05 DIAGNOSIS — N2581 Secondary hyperparathyroidism of renal origin: Secondary | ICD-10-CM | POA: Diagnosis not present

## 2018-08-05 DIAGNOSIS — N390 Urinary tract infection, site not specified: Secondary | ICD-10-CM | POA: Diagnosis not present

## 2018-08-05 DIAGNOSIS — R300 Dysuria: Secondary | ICD-10-CM | POA: Diagnosis not present

## 2018-08-07 DIAGNOSIS — N186 End stage renal disease: Secondary | ICD-10-CM | POA: Diagnosis not present

## 2018-08-07 DIAGNOSIS — N2581 Secondary hyperparathyroidism of renal origin: Secondary | ICD-10-CM | POA: Diagnosis not present

## 2018-08-07 DIAGNOSIS — N390 Urinary tract infection, site not specified: Secondary | ICD-10-CM | POA: Diagnosis not present

## 2018-08-07 DIAGNOSIS — D631 Anemia in chronic kidney disease: Secondary | ICD-10-CM | POA: Diagnosis not present

## 2018-08-10 DIAGNOSIS — N2581 Secondary hyperparathyroidism of renal origin: Secondary | ICD-10-CM | POA: Diagnosis not present

## 2018-08-10 DIAGNOSIS — N186 End stage renal disease: Secondary | ICD-10-CM | POA: Diagnosis not present

## 2018-08-10 DIAGNOSIS — D631 Anemia in chronic kidney disease: Secondary | ICD-10-CM | POA: Diagnosis not present

## 2018-08-10 DIAGNOSIS — N390 Urinary tract infection, site not specified: Secondary | ICD-10-CM | POA: Diagnosis not present

## 2018-08-12 DIAGNOSIS — E039 Hypothyroidism, unspecified: Secondary | ICD-10-CM | POA: Diagnosis not present

## 2018-08-12 DIAGNOSIS — D631 Anemia in chronic kidney disease: Secondary | ICD-10-CM | POA: Diagnosis not present

## 2018-08-12 DIAGNOSIS — N390 Urinary tract infection, site not specified: Secondary | ICD-10-CM | POA: Diagnosis not present

## 2018-08-12 DIAGNOSIS — N2581 Secondary hyperparathyroidism of renal origin: Secondary | ICD-10-CM | POA: Diagnosis not present

## 2018-08-12 DIAGNOSIS — N186 End stage renal disease: Secondary | ICD-10-CM | POA: Diagnosis not present

## 2018-08-12 DIAGNOSIS — R300 Dysuria: Secondary | ICD-10-CM | POA: Diagnosis not present

## 2018-08-14 DIAGNOSIS — N186 End stage renal disease: Secondary | ICD-10-CM | POA: Diagnosis not present

## 2018-08-14 DIAGNOSIS — D631 Anemia in chronic kidney disease: Secondary | ICD-10-CM | POA: Diagnosis not present

## 2018-08-14 DIAGNOSIS — N2581 Secondary hyperparathyroidism of renal origin: Secondary | ICD-10-CM | POA: Diagnosis not present

## 2018-08-14 DIAGNOSIS — N390 Urinary tract infection, site not specified: Secondary | ICD-10-CM | POA: Diagnosis not present

## 2018-08-17 DIAGNOSIS — D631 Anemia in chronic kidney disease: Secondary | ICD-10-CM | POA: Diagnosis not present

## 2018-08-17 DIAGNOSIS — N390 Urinary tract infection, site not specified: Secondary | ICD-10-CM | POA: Diagnosis not present

## 2018-08-17 DIAGNOSIS — N2581 Secondary hyperparathyroidism of renal origin: Secondary | ICD-10-CM | POA: Diagnosis not present

## 2018-08-17 DIAGNOSIS — N186 End stage renal disease: Secondary | ICD-10-CM | POA: Diagnosis not present

## 2018-08-19 DIAGNOSIS — D631 Anemia in chronic kidney disease: Secondary | ICD-10-CM | POA: Diagnosis not present

## 2018-08-19 DIAGNOSIS — N186 End stage renal disease: Secondary | ICD-10-CM | POA: Diagnosis not present

## 2018-08-19 DIAGNOSIS — N390 Urinary tract infection, site not specified: Secondary | ICD-10-CM | POA: Diagnosis not present

## 2018-08-19 DIAGNOSIS — N2581 Secondary hyperparathyroidism of renal origin: Secondary | ICD-10-CM | POA: Diagnosis not present

## 2018-08-21 DIAGNOSIS — N2581 Secondary hyperparathyroidism of renal origin: Secondary | ICD-10-CM | POA: Diagnosis not present

## 2018-08-21 DIAGNOSIS — D631 Anemia in chronic kidney disease: Secondary | ICD-10-CM | POA: Diagnosis not present

## 2018-08-21 DIAGNOSIS — N186 End stage renal disease: Secondary | ICD-10-CM | POA: Diagnosis not present

## 2018-08-21 DIAGNOSIS — N390 Urinary tract infection, site not specified: Secondary | ICD-10-CM | POA: Diagnosis not present

## 2018-08-24 DIAGNOSIS — N186 End stage renal disease: Secondary | ICD-10-CM | POA: Diagnosis not present

## 2018-08-24 DIAGNOSIS — D631 Anemia in chronic kidney disease: Secondary | ICD-10-CM | POA: Diagnosis not present

## 2018-08-24 DIAGNOSIS — N390 Urinary tract infection, site not specified: Secondary | ICD-10-CM | POA: Diagnosis not present

## 2018-08-24 DIAGNOSIS — N2581 Secondary hyperparathyroidism of renal origin: Secondary | ICD-10-CM | POA: Diagnosis not present

## 2018-08-26 DIAGNOSIS — N186 End stage renal disease: Secondary | ICD-10-CM | POA: Diagnosis not present

## 2018-08-26 DIAGNOSIS — N390 Urinary tract infection, site not specified: Secondary | ICD-10-CM | POA: Diagnosis not present

## 2018-08-26 DIAGNOSIS — N2581 Secondary hyperparathyroidism of renal origin: Secondary | ICD-10-CM | POA: Diagnosis not present

## 2018-08-26 DIAGNOSIS — D631 Anemia in chronic kidney disease: Secondary | ICD-10-CM | POA: Diagnosis not present

## 2018-08-28 DIAGNOSIS — N2581 Secondary hyperparathyroidism of renal origin: Secondary | ICD-10-CM | POA: Diagnosis not present

## 2018-08-28 DIAGNOSIS — N186 End stage renal disease: Secondary | ICD-10-CM | POA: Diagnosis not present

## 2018-08-28 DIAGNOSIS — D509 Iron deficiency anemia, unspecified: Secondary | ICD-10-CM | POA: Diagnosis not present

## 2018-08-28 DIAGNOSIS — D631 Anemia in chronic kidney disease: Secondary | ICD-10-CM | POA: Diagnosis not present

## 2018-08-28 DIAGNOSIS — Z992 Dependence on renal dialysis: Secondary | ICD-10-CM | POA: Diagnosis not present

## 2018-08-28 DIAGNOSIS — I120 Hypertensive chronic kidney disease with stage 5 chronic kidney disease or end stage renal disease: Secondary | ICD-10-CM | POA: Diagnosis not present

## 2018-08-31 DIAGNOSIS — D509 Iron deficiency anemia, unspecified: Secondary | ICD-10-CM | POA: Diagnosis not present

## 2018-08-31 DIAGNOSIS — N186 End stage renal disease: Secondary | ICD-10-CM | POA: Diagnosis not present

## 2018-08-31 DIAGNOSIS — D631 Anemia in chronic kidney disease: Secondary | ICD-10-CM | POA: Diagnosis not present

## 2018-08-31 DIAGNOSIS — N2581 Secondary hyperparathyroidism of renal origin: Secondary | ICD-10-CM | POA: Diagnosis not present

## 2018-09-02 DIAGNOSIS — N186 End stage renal disease: Secondary | ICD-10-CM | POA: Diagnosis not present

## 2018-09-02 DIAGNOSIS — N2581 Secondary hyperparathyroidism of renal origin: Secondary | ICD-10-CM | POA: Diagnosis not present

## 2018-09-02 DIAGNOSIS — D631 Anemia in chronic kidney disease: Secondary | ICD-10-CM | POA: Diagnosis not present

## 2018-09-02 DIAGNOSIS — D509 Iron deficiency anemia, unspecified: Secondary | ICD-10-CM | POA: Diagnosis not present

## 2018-09-04 DIAGNOSIS — N2581 Secondary hyperparathyroidism of renal origin: Secondary | ICD-10-CM | POA: Diagnosis not present

## 2018-09-04 DIAGNOSIS — D509 Iron deficiency anemia, unspecified: Secondary | ICD-10-CM | POA: Diagnosis not present

## 2018-09-04 DIAGNOSIS — D631 Anemia in chronic kidney disease: Secondary | ICD-10-CM | POA: Diagnosis not present

## 2018-09-04 DIAGNOSIS — N186 End stage renal disease: Secondary | ICD-10-CM | POA: Diagnosis not present

## 2018-09-07 DIAGNOSIS — N186 End stage renal disease: Secondary | ICD-10-CM | POA: Diagnosis not present

## 2018-09-07 DIAGNOSIS — D631 Anemia in chronic kidney disease: Secondary | ICD-10-CM | POA: Diagnosis not present

## 2018-09-07 DIAGNOSIS — D509 Iron deficiency anemia, unspecified: Secondary | ICD-10-CM | POA: Diagnosis not present

## 2018-09-07 DIAGNOSIS — N2581 Secondary hyperparathyroidism of renal origin: Secondary | ICD-10-CM | POA: Diagnosis not present

## 2018-09-09 DIAGNOSIS — N2581 Secondary hyperparathyroidism of renal origin: Secondary | ICD-10-CM | POA: Diagnosis not present

## 2018-09-09 DIAGNOSIS — D631 Anemia in chronic kidney disease: Secondary | ICD-10-CM | POA: Diagnosis not present

## 2018-09-09 DIAGNOSIS — N186 End stage renal disease: Secondary | ICD-10-CM | POA: Diagnosis not present

## 2018-09-09 DIAGNOSIS — D509 Iron deficiency anemia, unspecified: Secondary | ICD-10-CM | POA: Diagnosis not present

## 2018-09-11 DIAGNOSIS — N2581 Secondary hyperparathyroidism of renal origin: Secondary | ICD-10-CM | POA: Diagnosis not present

## 2018-09-11 DIAGNOSIS — D509 Iron deficiency anemia, unspecified: Secondary | ICD-10-CM | POA: Diagnosis not present

## 2018-09-11 DIAGNOSIS — D631 Anemia in chronic kidney disease: Secondary | ICD-10-CM | POA: Diagnosis not present

## 2018-09-11 DIAGNOSIS — N186 End stage renal disease: Secondary | ICD-10-CM | POA: Diagnosis not present

## 2018-09-12 ENCOUNTER — Emergency Department (HOSPITAL_COMMUNITY)
Admission: EM | Admit: 2018-09-12 | Discharge: 2018-09-12 | Disposition: A | Discharge status: Home / Self Care | Payer: Medicare Other | Attending: Emergency Medicine | Admitting: Emergency Medicine

## 2018-09-12 ENCOUNTER — Encounter (HOSPITAL_COMMUNITY): Payer: Self-pay | Admitting: Emergency Medicine

## 2018-09-12 ENCOUNTER — Emergency Department (HOSPITAL_COMMUNITY): Payer: Medicare Other

## 2018-09-12 ENCOUNTER — Other Ambulatory Visit: Payer: Self-pay

## 2018-09-12 DIAGNOSIS — N186 End stage renal disease: Secondary | ICD-10-CM | POA: Diagnosis not present

## 2018-09-12 DIAGNOSIS — R000 Tachycardia, unspecified: Secondary | ICD-10-CM | POA: Diagnosis not present

## 2018-09-12 DIAGNOSIS — Z91010 Allergy to peanuts: Secondary | ICD-10-CM | POA: Diagnosis not present

## 2018-09-12 DIAGNOSIS — R05 Cough: Secondary | ICD-10-CM | POA: Diagnosis not present

## 2018-09-12 DIAGNOSIS — N390 Urinary tract infection, site not specified: Secondary | ICD-10-CM | POA: Diagnosis not present

## 2018-09-12 DIAGNOSIS — Z91040 Latex allergy status: Secondary | ICD-10-CM | POA: Diagnosis not present

## 2018-09-12 DIAGNOSIS — I120 Hypertensive chronic kidney disease with stage 5 chronic kidney disease or end stage renal disease: Secondary | ICD-10-CM | POA: Insufficient documentation

## 2018-09-12 DIAGNOSIS — R112 Nausea with vomiting, unspecified: Secondary | ICD-10-CM

## 2018-09-12 DIAGNOSIS — Z87891 Personal history of nicotine dependence: Secondary | ICD-10-CM | POA: Diagnosis not present

## 2018-09-12 LAB — URINALYSIS, ROUTINE W REFLEX MICROSCOPIC
BILIRUBIN URINE: NEGATIVE
Glucose, UA: NEGATIVE mg/dL
KETONES UR: NEGATIVE mg/dL
NITRITE: NEGATIVE
PROTEIN: 100 mg/dL — AB
SPECIFIC GRAVITY, URINE: 1.008 (ref 1.005–1.030)
WBC, UA: >50 WBC/hpf — ABNORMAL HIGH (ref 0–5)
pH: 8 (ref 5.0–8.0)

## 2018-09-12 LAB — CBC WITH DIFFERENTIAL/PLATELET
ABS IMMATURE GRANULOCYTES: 0.01 10*3/uL (ref 0.00–0.07)
BASOS ABS: 0 10*3/uL (ref 0.0–0.1)
BASOS PCT: 0 %
EOS ABS: 0.1 10*3/uL (ref 0.0–0.5)
Eosinophils Relative: 1 %
HEMATOCRIT: 33.6 % — AB (ref 36.0–46.0)
Hemoglobin: 9.9 g/dL — ABNORMAL LOW (ref 12.0–15.0)
IMMATURE GRANULOCYTES: 0 %
LYMPHS ABS: 0.9 10*3/uL (ref 0.7–4.0)
Lymphocytes Relative: 17 %
MCH: 26.1 pg (ref 26.0–34.0)
MCHC: 29.5 g/dL — ABNORMAL LOW (ref 30.0–36.0)
MCV: 88.7 fL (ref 80.0–100.0)
Monocytes Absolute: 0.6 10*3/uL (ref 0.1–1.0)
Monocytes Relative: 10 %
NEUTROS ABS: 3.9 10*3/uL (ref 1.7–7.7)
NEUTROS PCT: 72 %
NRBC: 0 % (ref 0.0–0.2)
PLATELETS: 219 10*3/uL (ref 150–400)
RBC: 3.79 MIL/uL — ABNORMAL LOW (ref 3.87–5.11)
RDW: 16.4 % — AB (ref 11.5–15.5)
WBC: 5.5 10*3/uL (ref 4.0–10.5)

## 2018-09-12 LAB — BASIC METABOLIC PANEL
ANION GAP: 12 (ref 5–15)
BUN: 23 mg/dL — ABNORMAL HIGH (ref 6–20)
CALCIUM: 9.4 mg/dL (ref 8.9–10.3)
CO2: 24 mmol/L (ref 22–32)
Chloride: 101 mmol/L (ref 98–111)
Creatinine, Ser: 5.78 mg/dL — ABNORMAL HIGH (ref 0.44–1.00)
GFR, EST AFRICAN AMERICAN: 11 mL/min — AB (ref >60)
GFR, EST NON AFRICAN AMERICAN: 9 mL/min — AB (ref >60)
Glucose, Bld: 89 mg/dL (ref 70–99)
Potassium: 3.5 mmol/L (ref 3.5–5.1)
Sodium: 137 mmol/L (ref 135–145)

## 2018-09-12 LAB — LACTIC ACID, PLASMA
LACTIC ACID, VENOUS: 0.9 mmol/L (ref 0.5–1.9)
Lactic Acid, Venous: 1 mmol/L (ref 0.5–1.9)

## 2018-09-12 LAB — INFLUENZA PANEL BY PCR (TYPE A & B)
INFLAPCR: NEGATIVE
INFLBPCR: NEGATIVE

## 2018-09-12 MED ORDER — ACETAMINOPHEN 325 MG PO TABS
650.0000 mg | ORAL_TABLET | Freq: Once | ORAL | Status: AC
  Administered 2018-09-12: 650 mg via ORAL
  Filled 2018-09-12: qty 2

## 2018-09-12 MED ORDER — DIPHENHYDRAMINE HCL 12.5 MG/5ML PO SYRP: 12.5000 mg | ORAL_SOLUTION | Freq: Four times a day (QID) | ORAL | 0 refills | Status: DC | PRN

## 2018-09-12 MED ORDER — DIPHENHYDRAMINE HCL 12.5 MG/5ML PO ELIX
12.5000 mg | ORAL_SOLUTION | Freq: Once | ORAL | Status: AC
  Administered 2018-09-12: 12.5 mg via ORAL
  Filled 2018-09-12: qty 5

## 2018-09-12 MED ORDER — LACTATED RINGERS IV BOLUS
500.0000 mL | Freq: Once | INTRAVENOUS | Status: AC
  Administered 2018-09-12: 500 mL via INTRAVENOUS

## 2018-09-12 MED ORDER — SULFAMETHOXAZOLE-TRIMETHOPRIM 800-160 MG PO TABS: 1.0000 | ORAL_TABLET | Freq: Two times a day (BID) | ORAL | 0 refills | Status: AC

## 2018-09-12 MED ORDER — ONDANSETRON HCL 4 MG/2ML IJ SOLN
4.0000 mg | Freq: Once | INTRAMUSCULAR | Status: AC
  Administered 2018-09-12: 4 mg via INTRAVENOUS
  Filled 2018-09-12: qty 2

## 2018-09-12 MED ORDER — NITROFURANTOIN MACROCRYSTAL 100 MG PO CAPS: 100.0000 mg | ORAL_CAPSULE | Freq: Once | ORAL | Status: DC

## 2018-09-12 MED ORDER — SULFAMETHOXAZOLE-TRIMETHOPRIM 800-160 MG PO TABS
1.0000 | ORAL_TABLET | Freq: Once | ORAL | Status: AC
  Administered 2018-09-12: 1 via ORAL
  Filled 2018-09-12: qty 1

## 2018-09-12 MED ORDER — FENTANYL CITRATE (PF) 100 MCG/2ML IJ SOLN
25.0000 ug | Freq: Once | INTRAMUSCULAR | Status: AC
  Administered 2018-09-12: 25 ug via INTRAVENOUS
  Filled 2018-09-12: qty 2

## 2018-09-12 MED ORDER — SODIUM CHLORIDE 0.9 % IV SOLN
1.0000 g | Freq: Once | INTRAVENOUS | Status: AC
  Administered 2018-09-12: 1 g via INTRAVENOUS
  Filled 2018-09-12: qty 10

## 2018-09-12 NOTE — ED Triage Notes (Signed)
Pt c/o of n/v, left ear ache, body aches, cough and fever x Friday.

## 2018-09-12 NOTE — ED Provider Notes (Signed)
Emergency Department Provider Note   I have reviewed the triage vital signs and the nursing notes.   HISTORY  Chief Complaint Emesis   HPI [name] is a 28 y.o. female with multiple medical problems documented below the presents to the emergency department today with multiple symptoms.  Patient states that since Friday she is a progressively worsening nausea with emesis and subjective fevers.  States her temperature is 100.0 at dialysis on Saturday.  Also has had worsening left ear pain, pressure and decreased hearing.  Also noticed that she has had some cough and generalized body aches.  She was exposed to some type of upper respiratory infection in an infant a few days ago but no other known sick contacts.  States that she often has UTIs and has increased urination with this syndrome that she has been having since Friday.  Decreased intake secondary to vomiting.  Full session dialysis on Saturday. No other associated or modifying symptoms.    Past Medical History:  Diagnosis Date  . Anemia associated with chronic renal failure   . Blood transfusion   . 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  . Depression 05/14/2015  . Dialysis care   . ESRD (end stage renal disease) on dialysis ([place])   . GERD (gastroesophageal reflux disease) 01/07/2017  . HTN (hypertension) 05/02/2011  . Spina bifida   . UTI (lower urinary tract infection)     Patient Active Problem List   Diagnosis Date Noted  . Acute UTI 05/07/2018  . Tobacco use 05/07/2018  . Hypokalemia 05/07/2018  . GERD (gastroesophageal reflux disease) 01/07/2017  . Nausea with vomiting 01/07/2017  . Pyelonephritis 07/31/2015  . Back pain 07/31/2015  . Abdominal pain 07/31/2015  . Constipation 07/31/2015  . Major depressive disorder, single episode, severe without psychotic features ([place]) 05/15/2015  . Spina bifida ([place]) 01/18/2012  . ESRD (end stage renal disease) on dialysis ([place]) 05/02/2011  .  Anemia 05/02/2011  . HTN (hypertension) 05/02/2011    Past Surgical History:  Procedure Laterality Date  . AV FISTULA PLACEMENT     left arm    Current Outpatient Rx  . Order #: [id number] Class: Historical Med  . Order #: [id number] Class: Historical Med  . Order #: [id number] Class: Historical Med  . Order #: [id number] Class: Historical Med  . Order #: [id number] Class: Print  . Order #: [id number] Class: Normal    Allergies Ciprofloxacin; Other; Peanut-containing drug products; Aleve [naproxen sodium]; Ceftriaxone; Coconut oil; Influenza vaccines; Tetanus toxoid, adsorbed; Tetanus toxoids; and Latex  Family History  Problem Relation Age of Onset  . Kidney cancer Other   . Hypertension Maternal Grandmother   . Arthritis Maternal Grandmother   . Breast cancer Maternal Aunt   . Colon cancer Neg Hx     Social History Social History   Tobacco Use  . Smoking status: Former Smoker    Packs/day: 0.10    Types: Cigarettes  . Smokeless tobacco: Never Used  . Tobacco comment: 2 cigs a day  Substance Use Topics  . Alcohol use: No  . Drug use: No    Review of Systems  All other systems negative except as documented in the HPI. All pertinent positives and negatives as reviewed in the HPI. ____________________________________________   PHYSICAL EXAM:  VITAL SIGNS: ED Triage Vitals  Enc Vitals Group     BP 09/12/18 1443 (!) 142/87     Pulse Rate 09/12/18 1443 (!) 117     Resp 09/12/18 1443 18  Temp 09/12/18 1443 (!) 100.8 F (38.2 C)     Temp Source 09/12/18 1443 Oral     SpO2 09/12/18 1443 100 %     Weight 09/12/18 1444 62 lb 6.2 oz (28.3 kg)     Height --      Head Circumference --      Peak Flow --      Pain Score 09/12/18 1445 9     Pain Loc --      Pain Edu? --      Excl. in [place]? --     Constitutional: Alert and oriented. Well appearing and in no acute distress. Eyes: Conjunctivae are normal. PERRL. EOMI. Ears: L>R effusion, erythema, bulging.  Head:  Atraumatic. Nose: edematous and congested appearing Mouth/Throat: Mucous membranes are moist.  Oropharynx non-erythematous. Neck: No stridor.  No meningeal signs.  lymphadenopathy bilaterally Cardiovascular: tachycardic rate, regular rhythm. Good peripheral circulation. Grossly normal heart sounds.   Respiratory: tachypneic respiratory effort.  No retractions. Lungs CTAB. Gastrointestinal: Soft and nontender. No distention.  Musculoskeletal: No lower extremities. No gross deformities of extremities. Neurologic:  Normal speech and language. No gross focal neurologic deficits are appreciated.  Skin:  Skin is warm, dry and intact. No rash noted.   ____________________________________________   LABS (all labs ordered are listed, but only abnormal results are displayed)  Labs Reviewed  CBC WITH DIFFERENTIAL/PLATELET - Abnormal; Notable for the following components:      Result Value   RBC 3.79 (*)    Hemoglobin 9.9 (*)    HCT 33.6 (*)    MCHC 29.5 (*)    RDW 16.4 (*)    All other components within normal limits  BASIC METABOLIC PANEL - Abnormal; Notable for the following components:   BUN 23 (*)    Creatinine, Ser 5.78 (*)    GFR calc non Af Amer 9 (*)    GFR calc Af Amer 11 (*)    All other components within normal limits  URINALYSIS, ROUTINE W REFLEX MICROSCOPIC - Abnormal; Notable for the following components:   APPearance TURBID (*)    Hgb urine dipstick SMALL (*)    Protein, ur 100 (*)    Leukocytes,Ua MODERATE (*)    WBC, UA >50 (*)    Bacteria, UA MANY (*)    All other components within normal limits  CULTURE, BLOOD (ROUTINE X 2)  CULTURE, BLOOD (ROUTINE X 2)  URINE CULTURE  LACTIC ACID, PLASMA  LACTIC ACID, PLASMA  INFLUENZA PANEL BY PCR (TYPE A & B)   ____________________________________________  EKG   EKG Interpretation  Date/Time:    Ventricular Rate:    PR Interval:    QRS Duration:   QT Interval:    QTC Calculation:   R Axis:     Text  Interpretation:         ____________________________________________  RADIOLOGY  Dg Chest 2 View  Result Date: 09/12/2018 CLINICAL DATA:  Fever and cough. EXAM: CHEST - 2 VIEW COMPARISON:  January 02, 2017 FINDINGS: The heart size and mediastinal contours are within normal limits. Both lungs are clear. The visualized skeletal structures are unremarkable. IMPRESSION: No active cardiopulmonary disease. Electronically Signed   By: Dorise Bullion III M.D   On: 09/12/2018 16:31    ____________________________________________   PROCEDURES  Procedure(s) performed:   Procedures   ____________________________________________   INITIAL IMPRESSION / ASSESSMENT AND PLAN / ED COURSE  Signs symptoms consistent with likely flu however with her being a dialysis patient she would still  be within the treatment window.  Also possible left ear infection versus UTI.  Will evaluate electrolyte secondary to the multiple episodes of vomiting.  Symptomatic treatment in the meantime.  Tolerating PO. Stable for discharge.   Pertinent labs & imaging results that were available during my care of the patient were reviewed by me and considered in my medical decision making (see chart for details).  ____________________________________________  FINAL CLINICAL IMPRESSION(S) / ED DIAGNOSES  Final diagnoses:  Nausea and vomiting, intractability of vomiting not specified, unspecified vomiting type  Lower urinary tract infectious disease     MEDICATIONS GIVEN DURING THIS VISIT:  Medications  lactated ringers bolus 500 mL ( Intravenous Stopped 09/12/18 1646)  acetaminophen (TYLENOL) tablet 650 mg (650 mg Oral Given 09/12/18 1546)  ondansetron (ZOFRAN) injection 4 mg (4 mg Intravenous Given 09/12/18 1546)  fentaNYL (SUBLIMAZE) injection 25 mcg (25 mcg Intravenous Given 09/12/18 1737)  cefTRIAXone (ROCEPHIN) 1 g in sodium chloride 0.9 % 100 mL IVPB ( Intravenous Stopped 09/12/18 1744)  diphenhydrAMINE  (BENADRYL) 12.5 MG/5ML elixir 12.5 mg (12.5 mg Oral Given 09/12/18 1752)  sulfamethoxazole-trimethoprim (BACTRIM DS,SEPTRA DS) 800-160 MG per tablet 1 tablet (1 tablet Oral Given 09/12/18 1752)  diphenhydrAMINE (BENADRYL) 12.5 MG/5ML elixir 12.5 mg (12.5 mg Oral Given 09/12/18 1931)     NEW OUTPATIENT MEDICATIONS STARTED DURING THIS VISIT:  Discharge Medication List as of 09/12/2018  7:26 PM    START taking these medications   Details  diphenhydrAMINE (BENYLIN) 12.5 MG/5ML syrup Take 5 mLs (12.5 mg total) by mouth 4 (four) times daily as needed for itching or allergies., Starting Sun 09/12/2018, Print    sulfamethoxazole-trimethoprim (BACTRIM DS,SEPTRA DS) 800-160 MG tablet Take 1 tablet by mouth 2 (two) times daily for 10 days., Starting Sun 09/12/2018, Until Wed 09/22/2018, Normal        Note:  This note was prepared with assistance of Dragon voice recognition software. Occasional wrong-word or sound-a-like substitutions may have occurred due to the inherent limitations of voice recognition software.   Soma Lizak, Corene Cornea, MD 09/13/18 [phone]

## 2018-09-12 NOTE — ED Notes (Signed)
Pt called RN to room. Pt c/o "lips itching and my mouth feels funny". Pt's IV antibiotics stopped. IV flushed with NS. Dr. Dayna Barker notified immediately.

## 2018-09-14 DIAGNOSIS — D509 Iron deficiency anemia, unspecified: Secondary | ICD-10-CM | POA: Diagnosis not present

## 2018-09-14 DIAGNOSIS — N186 End stage renal disease: Secondary | ICD-10-CM | POA: Diagnosis not present

## 2018-09-14 DIAGNOSIS — N2581 Secondary hyperparathyroidism of renal origin: Secondary | ICD-10-CM | POA: Diagnosis not present

## 2018-09-14 DIAGNOSIS — D631 Anemia in chronic kidney disease: Secondary | ICD-10-CM | POA: Diagnosis not present

## 2018-09-16 DIAGNOSIS — D631 Anemia in chronic kidney disease: Secondary | ICD-10-CM | POA: Diagnosis not present

## 2018-09-16 DIAGNOSIS — D509 Iron deficiency anemia, unspecified: Secondary | ICD-10-CM | POA: Diagnosis not present

## 2018-09-16 DIAGNOSIS — N2581 Secondary hyperparathyroidism of renal origin: Secondary | ICD-10-CM | POA: Diagnosis not present

## 2018-09-16 DIAGNOSIS — N186 End stage renal disease: Secondary | ICD-10-CM | POA: Diagnosis not present

## 2018-09-17 LAB — CULTURE, BLOOD (ROUTINE X 2)
CULTURE: NO GROWTH
Culture: NO GROWTH
Special Requests: ADEQUATE

## 2018-09-18 DIAGNOSIS — N186 End stage renal disease: Secondary | ICD-10-CM | POA: Diagnosis not present

## 2018-09-18 DIAGNOSIS — D631 Anemia in chronic kidney disease: Secondary | ICD-10-CM | POA: Diagnosis not present

## 2018-09-18 DIAGNOSIS — N2581 Secondary hyperparathyroidism of renal origin: Secondary | ICD-10-CM | POA: Diagnosis not present

## 2018-09-18 DIAGNOSIS — D509 Iron deficiency anemia, unspecified: Secondary | ICD-10-CM | POA: Diagnosis not present

## 2018-09-21 DIAGNOSIS — D631 Anemia in chronic kidney disease: Secondary | ICD-10-CM | POA: Diagnosis not present

## 2018-09-21 DIAGNOSIS — D509 Iron deficiency anemia, unspecified: Secondary | ICD-10-CM | POA: Diagnosis not present

## 2018-09-21 DIAGNOSIS — N2581 Secondary hyperparathyroidism of renal origin: Secondary | ICD-10-CM | POA: Diagnosis not present

## 2018-09-21 DIAGNOSIS — N186 End stage renal disease: Secondary | ICD-10-CM | POA: Diagnosis not present

## 2018-09-22 LAB — SUSCEPTIBILITY, AER + ANAEROB

## 2018-09-22 LAB — SUSCEPTIBILITY RESULT

## 2018-09-23 LAB — URINE CULTURE: Culture: >=100000 — AB

## 2018-09-24 ENCOUNTER — Telehealth: Payer: Self-pay | Admitting: *Deleted

## 2018-09-24 NOTE — Progress Notes (Signed)
ED Antimicrobial Stewardship Positive Culture Follow Up   Liv A Popson is an 28 y.o. female who presented to [facility] on 09/12/2018 with a chief complaint of  Chief Complaint  Patient presents with  . Emesis    Recent Results (from the past 720 hour(s))  Blood culture (routine x 2)     Status: None   Collection Time: 09/12/18  3:23 PM  Result Value Ref Range Status   Specimen Description BLOOD RIGHT ARM  Final   Special Requests   Final    BOTTLES DRAWN AEROBIC AND ANAEROBIC Blood Culture results may not be optimal due to an excessive volume of blood received in culture bottles   Culture   Final    NO GROWTH 5 DAYS Performed at [facility], [address], [place], [place] [address]    Report Status 09/17/2018 FINAL  Final  Blood culture (routine x 2)     Status: None   Collection Time: 09/12/18  3:26 PM  Result Value Ref Range Status   Specimen Description RIGHT ANTECUBITAL  Final   Special Requests   Final    BOTTLES DRAWN AEROBIC AND ANAEROBIC Blood Culture adequate volume   Culture   Final    NO GROWTH 5 DAYS Performed at [facility], [address] [place], [place] [address]    Report Status 09/17/2018 FINAL  Final  Urine culture     Status: Abnormal   Collection Time: 09/12/18  3:51 PM  Result Value Ref Range Status   Specimen Description   Final    URINE, CLEAN CATCH Performed at [facility], [address] [place], [place] [address]    Special Requests   Final    NONE Performed at [facility], [address] [place], [place] [address]    Culture (A)  Final    >=100,000 COLONIES/mL PSEUDOMONAS AERUGINOSA SUSCEPTIBILITIES Performed at [place] [facility] IN [facility] FOR DOS 09/12/18. Performed at [facility], [place] [address], [place], [place] [address]    Report Status 09/23/2018 FINAL  Final  Susceptibility, Aer + Anaerob     Status: None   Collection Time: 09/13/18  3:51 PM  Result Value Ref Range Status   Suscept, Aer + Anaerob  Final report  Corrected    Comment: (NOTE) Performed At: [facility] [address] [place], [place] [id number] Rush Farmer MD QX:[id number] CORRECTED ON 02/26 AT 0836: PREVIOUSLY REPORTED AS Preliminary report    Source of Sample URINE, RANDOM  Final    Comment: Performed at [facility], [place] [address] [place],  [address]  Susceptibility Result     Status: Abnormal   Collection Time: 09/13/18  3:51 PM  Result Value Ref Range Status   Suscept Result 1 Comment (A)  Final    Comment: (NOTE) Pseudomonas aeruginosa Identification performed by account, not confirmed by this laboratory.    Antimicrobial Suscept Comment  Corrected    Comment: (NOTE)      ** S = Susceptible; I = Intermediate; R = Resistant **                   P = Positive; N = Negative            MICS are expressed in micrograms per mL   Antibiotic                 RSLT#1    RSLT#2    RSLT#3    RSLT#4 Amikacin  S Cefepime                       S Ceftazidime                    I Ciprofloxacin                  S Gentamicin                     S Imipenem                       S Levofloxacin                   S Meropenem                      S Ticarcillin                    S Tobramycin                     S Performed At: [facility] [address] [place], [place] [id number] Rush Farmer MD [id number]     [x]  Treated with Bactrim, organism resistant to prescribed antimicrobial  New antibiotic prescription: No further treatment recommended for asymptomatic bacteriuria.  ED Provider: Langston Masker, PA-C   Romona Curls 09/24/2018, 9:49 AM Clinical Pharmacist Monday - Friday phone -  [phone] Saturday - Sunday phone - [phone]

## 2018-09-24 NOTE — Telephone Encounter (Signed)
Post ED Visit - Positive Culture Follow-up  Culture report reviewed by antimicrobial stewardship pharmacist: [place] Team []  Elenor Quinones, Pharm.D. []  Heide Guile, Pharm.D., BCPS AQ-ID []  Parks Neptune, Pharm.D., BCPS []  Alycia Rossetti, Pharm.D., BCPS []  [place], Pharm.D., BCPS, AAHIVP []  Legrand Como, Pharm.D., BCPS, AAHIVP []  Salome Arnt, PharmD, BCPS []  Johnnette Gourd, PharmD, BCPS []  Hughes Better, PharmD, BCPS []  Leeroy Cha, PharmD []  Laqueta Linden, PharmD, BCPS []  Albertina Parr, PharmD Elicia Lamp, PharmD  [place] Team []  Leodis Sias, PharmD []  Lindell Spar, PharmD []  Royetta Asal, PharmD []  Graylin Shiver, Rph []  Rema Fendt) Glennon Mac, PharmD []  Arlyn Dunning, PharmD []  Netta Cedars, PharmD []  Dia Sitter, PharmD []  Leone Haven, PharmD []  Gretta Arab, PharmD []  Theodis Shove, PharmD []  Peggyann Juba, PharmD []  Reuel Boom, PharmD   Positive urine culture Treated with Si;fa,etjpxazp;e-Trimethoprim, organism sensitive to the same and no further patient follow-up is required at this time.  Harlon Flor [facility] 09/24/2018, 11:11 AM

## 2018-09-25 DIAGNOSIS — D631 Anemia in chronic kidney disease: Secondary | ICD-10-CM | POA: Diagnosis not present

## 2018-09-25 DIAGNOSIS — N2581 Secondary hyperparathyroidism of renal origin: Secondary | ICD-10-CM | POA: Diagnosis not present

## 2018-09-25 DIAGNOSIS — N186 End stage renal disease: Secondary | ICD-10-CM | POA: Diagnosis not present

## 2018-09-25 DIAGNOSIS — D509 Iron deficiency anemia, unspecified: Secondary | ICD-10-CM | POA: Diagnosis not present

## 2018-09-26 DIAGNOSIS — I120 Hypertensive chronic kidney disease with stage 5 chronic kidney disease or end stage renal disease: Secondary | ICD-10-CM | POA: Diagnosis not present

## 2018-09-26 DIAGNOSIS — Z992 Dependence on renal dialysis: Secondary | ICD-10-CM | POA: Diagnosis not present

## 2018-09-26 DIAGNOSIS — N186 End stage renal disease: Secondary | ICD-10-CM | POA: Diagnosis not present

## 2018-09-28 DIAGNOSIS — D631 Anemia in chronic kidney disease: Secondary | ICD-10-CM | POA: Diagnosis not present

## 2018-09-28 DIAGNOSIS — N186 End stage renal disease: Secondary | ICD-10-CM | POA: Diagnosis not present

## 2018-09-28 DIAGNOSIS — E876 Hypokalemia: Secondary | ICD-10-CM | POA: Diagnosis not present

## 2018-09-28 DIAGNOSIS — N2581 Secondary hyperparathyroidism of renal origin: Secondary | ICD-10-CM | POA: Diagnosis not present

## 2018-09-28 DIAGNOSIS — D509 Iron deficiency anemia, unspecified: Secondary | ICD-10-CM | POA: Diagnosis not present

## 2018-09-30 DIAGNOSIS — D509 Iron deficiency anemia, unspecified: Secondary | ICD-10-CM | POA: Diagnosis not present

## 2018-09-30 DIAGNOSIS — D631 Anemia in chronic kidney disease: Secondary | ICD-10-CM | POA: Diagnosis not present

## 2018-09-30 DIAGNOSIS — N186 End stage renal disease: Secondary | ICD-10-CM | POA: Diagnosis not present

## 2018-09-30 DIAGNOSIS — E876 Hypokalemia: Secondary | ICD-10-CM | POA: Diagnosis not present

## 2018-09-30 DIAGNOSIS — N2581 Secondary hyperparathyroidism of renal origin: Secondary | ICD-10-CM | POA: Diagnosis not present

## 2018-10-02 DIAGNOSIS — E876 Hypokalemia: Secondary | ICD-10-CM | POA: Diagnosis not present

## 2018-10-02 DIAGNOSIS — D509 Iron deficiency anemia, unspecified: Secondary | ICD-10-CM | POA: Diagnosis not present

## 2018-10-02 DIAGNOSIS — D631 Anemia in chronic kidney disease: Secondary | ICD-10-CM | POA: Diagnosis not present

## 2018-10-02 DIAGNOSIS — N2581 Secondary hyperparathyroidism of renal origin: Secondary | ICD-10-CM | POA: Diagnosis not present

## 2018-10-02 DIAGNOSIS — N186 End stage renal disease: Secondary | ICD-10-CM | POA: Diagnosis not present

## 2018-10-05 DIAGNOSIS — N2581 Secondary hyperparathyroidism of renal origin: Secondary | ICD-10-CM | POA: Diagnosis not present

## 2018-10-05 DIAGNOSIS — D631 Anemia in chronic kidney disease: Secondary | ICD-10-CM | POA: Diagnosis not present

## 2018-10-05 DIAGNOSIS — N186 End stage renal disease: Secondary | ICD-10-CM | POA: Diagnosis not present

## 2018-10-05 DIAGNOSIS — E876 Hypokalemia: Secondary | ICD-10-CM | POA: Diagnosis not present

## 2018-10-05 DIAGNOSIS — D509 Iron deficiency anemia, unspecified: Secondary | ICD-10-CM | POA: Diagnosis not present

## 2018-10-07 DIAGNOSIS — D631 Anemia in chronic kidney disease: Secondary | ICD-10-CM | POA: Diagnosis not present

## 2018-10-07 DIAGNOSIS — N186 End stage renal disease: Secondary | ICD-10-CM | POA: Diagnosis not present

## 2018-10-07 DIAGNOSIS — D509 Iron deficiency anemia, unspecified: Secondary | ICD-10-CM | POA: Diagnosis not present

## 2018-10-07 DIAGNOSIS — N2581 Secondary hyperparathyroidism of renal origin: Secondary | ICD-10-CM | POA: Diagnosis not present

## 2018-10-07 DIAGNOSIS — E876 Hypokalemia: Secondary | ICD-10-CM | POA: Diagnosis not present

## 2018-10-09 DIAGNOSIS — D509 Iron deficiency anemia, unspecified: Secondary | ICD-10-CM | POA: Diagnosis not present

## 2018-10-09 DIAGNOSIS — N186 End stage renal disease: Secondary | ICD-10-CM | POA: Diagnosis not present

## 2018-10-09 DIAGNOSIS — D631 Anemia in chronic kidney disease: Secondary | ICD-10-CM | POA: Diagnosis not present

## 2018-10-09 DIAGNOSIS — E876 Hypokalemia: Secondary | ICD-10-CM | POA: Diagnosis not present

## 2018-10-09 DIAGNOSIS — N2581 Secondary hyperparathyroidism of renal origin: Secondary | ICD-10-CM | POA: Diagnosis not present

## 2018-10-12 DIAGNOSIS — D509 Iron deficiency anemia, unspecified: Secondary | ICD-10-CM | POA: Diagnosis not present

## 2018-10-12 DIAGNOSIS — N2581 Secondary hyperparathyroidism of renal origin: Secondary | ICD-10-CM | POA: Diagnosis not present

## 2018-10-12 DIAGNOSIS — E876 Hypokalemia: Secondary | ICD-10-CM | POA: Diagnosis not present

## 2018-10-12 DIAGNOSIS — N186 End stage renal disease: Secondary | ICD-10-CM | POA: Diagnosis not present

## 2018-10-12 DIAGNOSIS — D631 Anemia in chronic kidney disease: Secondary | ICD-10-CM | POA: Diagnosis not present

## 2018-10-14 DIAGNOSIS — N2581 Secondary hyperparathyroidism of renal origin: Secondary | ICD-10-CM | POA: Diagnosis not present

## 2018-10-14 DIAGNOSIS — D509 Iron deficiency anemia, unspecified: Secondary | ICD-10-CM | POA: Diagnosis not present

## 2018-10-14 DIAGNOSIS — E876 Hypokalemia: Secondary | ICD-10-CM | POA: Diagnosis not present

## 2018-10-14 DIAGNOSIS — N186 End stage renal disease: Secondary | ICD-10-CM | POA: Diagnosis not present

## 2018-10-14 DIAGNOSIS — D631 Anemia in chronic kidney disease: Secondary | ICD-10-CM | POA: Diagnosis not present

## 2018-10-16 DIAGNOSIS — E876 Hypokalemia: Secondary | ICD-10-CM | POA: Diagnosis not present

## 2018-10-16 DIAGNOSIS — D509 Iron deficiency anemia, unspecified: Secondary | ICD-10-CM | POA: Diagnosis not present

## 2018-10-16 DIAGNOSIS — D631 Anemia in chronic kidney disease: Secondary | ICD-10-CM | POA: Diagnosis not present

## 2018-10-16 DIAGNOSIS — N2581 Secondary hyperparathyroidism of renal origin: Secondary | ICD-10-CM | POA: Diagnosis not present

## 2018-10-16 DIAGNOSIS — N186 End stage renal disease: Secondary | ICD-10-CM | POA: Diagnosis not present

## 2018-10-19 DIAGNOSIS — D631 Anemia in chronic kidney disease: Secondary | ICD-10-CM | POA: Diagnosis not present

## 2018-10-19 DIAGNOSIS — E876 Hypokalemia: Secondary | ICD-10-CM | POA: Diagnosis not present

## 2018-10-19 DIAGNOSIS — D509 Iron deficiency anemia, unspecified: Secondary | ICD-10-CM | POA: Diagnosis not present

## 2018-10-19 DIAGNOSIS — N2581 Secondary hyperparathyroidism of renal origin: Secondary | ICD-10-CM | POA: Diagnosis not present

## 2018-10-19 DIAGNOSIS — N186 End stage renal disease: Secondary | ICD-10-CM | POA: Diagnosis not present

## 2018-10-21 DIAGNOSIS — N186 End stage renal disease: Secondary | ICD-10-CM | POA: Diagnosis not present

## 2018-10-21 DIAGNOSIS — D509 Iron deficiency anemia, unspecified: Secondary | ICD-10-CM | POA: Diagnosis not present

## 2018-10-21 DIAGNOSIS — D631 Anemia in chronic kidney disease: Secondary | ICD-10-CM | POA: Diagnosis not present

## 2018-10-21 DIAGNOSIS — I871 Compression of vein: Secondary | ICD-10-CM | POA: Diagnosis not present

## 2018-10-21 DIAGNOSIS — N2581 Secondary hyperparathyroidism of renal origin: Secondary | ICD-10-CM | POA: Diagnosis not present

## 2018-10-21 DIAGNOSIS — Z992 Dependence on renal dialysis: Secondary | ICD-10-CM | POA: Diagnosis not present

## 2018-10-21 DIAGNOSIS — E876 Hypokalemia: Secondary | ICD-10-CM | POA: Diagnosis not present

## 2018-10-23 DIAGNOSIS — D509 Iron deficiency anemia, unspecified: Secondary | ICD-10-CM | POA: Diagnosis not present

## 2018-10-23 DIAGNOSIS — D631 Anemia in chronic kidney disease: Secondary | ICD-10-CM | POA: Diagnosis not present

## 2018-10-23 DIAGNOSIS — N2581 Secondary hyperparathyroidism of renal origin: Secondary | ICD-10-CM | POA: Diagnosis not present

## 2018-10-23 DIAGNOSIS — N186 End stage renal disease: Secondary | ICD-10-CM | POA: Diagnosis not present

## 2018-10-23 DIAGNOSIS — E876 Hypokalemia: Secondary | ICD-10-CM | POA: Diagnosis not present

## 2018-10-26 DIAGNOSIS — N2581 Secondary hyperparathyroidism of renal origin: Secondary | ICD-10-CM | POA: Diagnosis not present

## 2018-10-26 DIAGNOSIS — D509 Iron deficiency anemia, unspecified: Secondary | ICD-10-CM | POA: Diagnosis not present

## 2018-10-26 DIAGNOSIS — D631 Anemia in chronic kidney disease: Secondary | ICD-10-CM | POA: Diagnosis not present

## 2018-10-26 DIAGNOSIS — N186 End stage renal disease: Secondary | ICD-10-CM | POA: Diagnosis not present

## 2018-10-26 DIAGNOSIS — E876 Hypokalemia: Secondary | ICD-10-CM | POA: Diagnosis not present

## 2018-10-27 DIAGNOSIS — Z992 Dependence on renal dialysis: Secondary | ICD-10-CM | POA: Diagnosis not present

## 2018-10-27 DIAGNOSIS — I120 Hypertensive chronic kidney disease with stage 5 chronic kidney disease or end stage renal disease: Secondary | ICD-10-CM | POA: Diagnosis not present

## 2018-10-27 DIAGNOSIS — N186 End stage renal disease: Secondary | ICD-10-CM | POA: Diagnosis not present

## 2018-10-28 DIAGNOSIS — N2581 Secondary hyperparathyroidism of renal origin: Secondary | ICD-10-CM | POA: Diagnosis not present

## 2018-10-28 DIAGNOSIS — D631 Anemia in chronic kidney disease: Secondary | ICD-10-CM | POA: Diagnosis not present

## 2018-10-28 DIAGNOSIS — E876 Hypokalemia: Secondary | ICD-10-CM | POA: Diagnosis not present

## 2018-10-28 DIAGNOSIS — N186 End stage renal disease: Secondary | ICD-10-CM | POA: Diagnosis not present

## 2018-10-30 DIAGNOSIS — D631 Anemia in chronic kidney disease: Secondary | ICD-10-CM | POA: Diagnosis not present

## 2018-10-30 DIAGNOSIS — N2581 Secondary hyperparathyroidism of renal origin: Secondary | ICD-10-CM | POA: Diagnosis not present

## 2018-10-30 DIAGNOSIS — E876 Hypokalemia: Secondary | ICD-10-CM | POA: Diagnosis not present

## 2018-10-30 DIAGNOSIS — N186 End stage renal disease: Secondary | ICD-10-CM | POA: Diagnosis not present

## 2018-11-02 DIAGNOSIS — N186 End stage renal disease: Secondary | ICD-10-CM | POA: Diagnosis not present

## 2018-11-02 DIAGNOSIS — E876 Hypokalemia: Secondary | ICD-10-CM | POA: Diagnosis not present

## 2018-11-02 DIAGNOSIS — N2581 Secondary hyperparathyroidism of renal origin: Secondary | ICD-10-CM | POA: Diagnosis not present

## 2018-11-02 DIAGNOSIS — D631 Anemia in chronic kidney disease: Secondary | ICD-10-CM | POA: Diagnosis not present

## 2018-11-04 DIAGNOSIS — N2581 Secondary hyperparathyroidism of renal origin: Secondary | ICD-10-CM | POA: Diagnosis not present

## 2018-11-04 DIAGNOSIS — N186 End stage renal disease: Secondary | ICD-10-CM | POA: Diagnosis not present

## 2018-11-04 DIAGNOSIS — D631 Anemia in chronic kidney disease: Secondary | ICD-10-CM | POA: Diagnosis not present

## 2018-11-04 DIAGNOSIS — E876 Hypokalemia: Secondary | ICD-10-CM | POA: Diagnosis not present

## 2018-11-06 DIAGNOSIS — N186 End stage renal disease: Secondary | ICD-10-CM | POA: Diagnosis not present

## 2018-11-06 DIAGNOSIS — D631 Anemia in chronic kidney disease: Secondary | ICD-10-CM | POA: Diagnosis not present

## 2018-11-06 DIAGNOSIS — E876 Hypokalemia: Secondary | ICD-10-CM | POA: Diagnosis not present

## 2018-11-06 DIAGNOSIS — N2581 Secondary hyperparathyroidism of renal origin: Secondary | ICD-10-CM | POA: Diagnosis not present

## 2018-11-09 DIAGNOSIS — N2581 Secondary hyperparathyroidism of renal origin: Secondary | ICD-10-CM | POA: Diagnosis not present

## 2018-11-09 DIAGNOSIS — D631 Anemia in chronic kidney disease: Secondary | ICD-10-CM | POA: Diagnosis not present

## 2018-11-09 DIAGNOSIS — N186 End stage renal disease: Secondary | ICD-10-CM | POA: Diagnosis not present

## 2018-11-09 DIAGNOSIS — E876 Hypokalemia: Secondary | ICD-10-CM | POA: Diagnosis not present

## 2018-11-11 DIAGNOSIS — D631 Anemia in chronic kidney disease: Secondary | ICD-10-CM | POA: Diagnosis not present

## 2018-11-11 DIAGNOSIS — E039 Hypothyroidism, unspecified: Secondary | ICD-10-CM | POA: Diagnosis not present

## 2018-11-11 DIAGNOSIS — N2581 Secondary hyperparathyroidism of renal origin: Secondary | ICD-10-CM | POA: Diagnosis not present

## 2018-11-11 DIAGNOSIS — E876 Hypokalemia: Secondary | ICD-10-CM | POA: Diagnosis not present

## 2018-11-11 DIAGNOSIS — N186 End stage renal disease: Secondary | ICD-10-CM | POA: Diagnosis not present

## 2018-11-13 DIAGNOSIS — E876 Hypokalemia: Secondary | ICD-10-CM | POA: Diagnosis not present

## 2018-11-13 DIAGNOSIS — N186 End stage renal disease: Secondary | ICD-10-CM | POA: Diagnosis not present

## 2018-11-13 DIAGNOSIS — D631 Anemia in chronic kidney disease: Secondary | ICD-10-CM | POA: Diagnosis not present

## 2018-11-13 DIAGNOSIS — N2581 Secondary hyperparathyroidism of renal origin: Secondary | ICD-10-CM | POA: Diagnosis not present

## 2018-11-16 DIAGNOSIS — N2581 Secondary hyperparathyroidism of renal origin: Secondary | ICD-10-CM | POA: Diagnosis not present

## 2018-11-16 DIAGNOSIS — N186 End stage renal disease: Secondary | ICD-10-CM | POA: Diagnosis not present

## 2018-11-16 DIAGNOSIS — D631 Anemia in chronic kidney disease: Secondary | ICD-10-CM | POA: Diagnosis not present

## 2018-11-16 DIAGNOSIS — E876 Hypokalemia: Secondary | ICD-10-CM | POA: Diagnosis not present

## 2018-11-20 DIAGNOSIS — N2581 Secondary hyperparathyroidism of renal origin: Secondary | ICD-10-CM | POA: Diagnosis not present

## 2018-11-20 DIAGNOSIS — N186 End stage renal disease: Secondary | ICD-10-CM | POA: Diagnosis not present

## 2018-11-20 DIAGNOSIS — E876 Hypokalemia: Secondary | ICD-10-CM | POA: Diagnosis not present

## 2018-11-20 DIAGNOSIS — D631 Anemia in chronic kidney disease: Secondary | ICD-10-CM | POA: Diagnosis not present

## 2018-11-23 DIAGNOSIS — N2581 Secondary hyperparathyroidism of renal origin: Secondary | ICD-10-CM | POA: Diagnosis not present

## 2018-11-23 DIAGNOSIS — E876 Hypokalemia: Secondary | ICD-10-CM | POA: Diagnosis not present

## 2018-11-23 DIAGNOSIS — N186 End stage renal disease: Secondary | ICD-10-CM | POA: Diagnosis not present

## 2018-11-23 DIAGNOSIS — D631 Anemia in chronic kidney disease: Secondary | ICD-10-CM | POA: Diagnosis not present

## 2018-11-25 DIAGNOSIS — E876 Hypokalemia: Secondary | ICD-10-CM | POA: Diagnosis not present

## 2018-11-25 DIAGNOSIS — N2581 Secondary hyperparathyroidism of renal origin: Secondary | ICD-10-CM | POA: Diagnosis not present

## 2018-11-25 DIAGNOSIS — D631 Anemia in chronic kidney disease: Secondary | ICD-10-CM | POA: Diagnosis not present

## 2018-11-25 DIAGNOSIS — N186 End stage renal disease: Secondary | ICD-10-CM | POA: Diagnosis not present

## 2018-11-26 DIAGNOSIS — N186 End stage renal disease: Secondary | ICD-10-CM | POA: Diagnosis not present

## 2018-11-26 DIAGNOSIS — I120 Hypertensive chronic kidney disease with stage 5 chronic kidney disease or end stage renal disease: Secondary | ICD-10-CM | POA: Diagnosis not present

## 2018-11-26 DIAGNOSIS — Z992 Dependence on renal dialysis: Secondary | ICD-10-CM | POA: Diagnosis not present

## 2018-11-30 DIAGNOSIS — N2581 Secondary hyperparathyroidism of renal origin: Secondary | ICD-10-CM | POA: Diagnosis not present

## 2018-11-30 DIAGNOSIS — N186 End stage renal disease: Secondary | ICD-10-CM | POA: Diagnosis not present

## 2018-12-02 DIAGNOSIS — N2581 Secondary hyperparathyroidism of renal origin: Secondary | ICD-10-CM | POA: Diagnosis not present

## 2018-12-02 DIAGNOSIS — N186 End stage renal disease: Secondary | ICD-10-CM | POA: Diagnosis not present

## 2018-12-04 DIAGNOSIS — N2581 Secondary hyperparathyroidism of renal origin: Secondary | ICD-10-CM | POA: Diagnosis not present

## 2018-12-04 DIAGNOSIS — N186 End stage renal disease: Secondary | ICD-10-CM | POA: Diagnosis not present

## 2018-12-07 DIAGNOSIS — N2581 Secondary hyperparathyroidism of renal origin: Secondary | ICD-10-CM | POA: Diagnosis not present

## 2018-12-07 DIAGNOSIS — N186 End stage renal disease: Secondary | ICD-10-CM | POA: Diagnosis not present

## 2018-12-09 DIAGNOSIS — N2581 Secondary hyperparathyroidism of renal origin: Secondary | ICD-10-CM | POA: Diagnosis not present

## 2018-12-09 DIAGNOSIS — N186 End stage renal disease: Secondary | ICD-10-CM | POA: Diagnosis not present

## 2018-12-11 DIAGNOSIS — N2581 Secondary hyperparathyroidism of renal origin: Secondary | ICD-10-CM | POA: Diagnosis not present

## 2018-12-11 DIAGNOSIS — N186 End stage renal disease: Secondary | ICD-10-CM | POA: Diagnosis not present

## 2018-12-16 DIAGNOSIS — N186 End stage renal disease: Secondary | ICD-10-CM | POA: Diagnosis not present

## 2018-12-16 DIAGNOSIS — N2581 Secondary hyperparathyroidism of renal origin: Secondary | ICD-10-CM | POA: Diagnosis not present

## 2018-12-18 DIAGNOSIS — N2581 Secondary hyperparathyroidism of renal origin: Secondary | ICD-10-CM | POA: Diagnosis not present

## 2018-12-18 DIAGNOSIS — N186 End stage renal disease: Secondary | ICD-10-CM | POA: Diagnosis not present

## 2018-12-21 DIAGNOSIS — N2581 Secondary hyperparathyroidism of renal origin: Secondary | ICD-10-CM | POA: Diagnosis not present

## 2018-12-21 DIAGNOSIS — N186 End stage renal disease: Secondary | ICD-10-CM | POA: Diagnosis not present

## 2018-12-23 DIAGNOSIS — N2581 Secondary hyperparathyroidism of renal origin: Secondary | ICD-10-CM | POA: Diagnosis not present

## 2018-12-23 DIAGNOSIS — N186 End stage renal disease: Secondary | ICD-10-CM | POA: Diagnosis not present

## 2018-12-25 DIAGNOSIS — N186 End stage renal disease: Secondary | ICD-10-CM | POA: Diagnosis not present

## 2018-12-25 DIAGNOSIS — N2581 Secondary hyperparathyroidism of renal origin: Secondary | ICD-10-CM | POA: Diagnosis not present

## 2018-12-27 DIAGNOSIS — I120 Hypertensive chronic kidney disease with stage 5 chronic kidney disease or end stage renal disease: Secondary | ICD-10-CM | POA: Diagnosis not present

## 2018-12-27 DIAGNOSIS — N186 End stage renal disease: Secondary | ICD-10-CM | POA: Diagnosis not present

## 2018-12-27 DIAGNOSIS — Z992 Dependence on renal dialysis: Secondary | ICD-10-CM | POA: Diagnosis not present

## 2018-12-30 DIAGNOSIS — N186 End stage renal disease: Secondary | ICD-10-CM | POA: Diagnosis not present

## 2018-12-30 DIAGNOSIS — N2581 Secondary hyperparathyroidism of renal origin: Secondary | ICD-10-CM | POA: Diagnosis not present

## 2018-12-30 DIAGNOSIS — D631 Anemia in chronic kidney disease: Secondary | ICD-10-CM | POA: Diagnosis not present

## 2019-01-01 DIAGNOSIS — N2581 Secondary hyperparathyroidism of renal origin: Secondary | ICD-10-CM | POA: Diagnosis not present

## 2019-01-01 DIAGNOSIS — D631 Anemia in chronic kidney disease: Secondary | ICD-10-CM | POA: Diagnosis not present

## 2019-01-01 DIAGNOSIS — N186 End stage renal disease: Secondary | ICD-10-CM | POA: Diagnosis not present

## 2019-01-04 DIAGNOSIS — D631 Anemia in chronic kidney disease: Secondary | ICD-10-CM | POA: Diagnosis not present

## 2019-01-04 DIAGNOSIS — N186 End stage renal disease: Secondary | ICD-10-CM | POA: Diagnosis not present

## 2019-01-04 DIAGNOSIS — N2581 Secondary hyperparathyroidism of renal origin: Secondary | ICD-10-CM | POA: Diagnosis not present

## 2019-01-06 DIAGNOSIS — N186 End stage renal disease: Secondary | ICD-10-CM | POA: Diagnosis not present

## 2019-01-06 DIAGNOSIS — N2581 Secondary hyperparathyroidism of renal origin: Secondary | ICD-10-CM | POA: Diagnosis not present

## 2019-01-06 DIAGNOSIS — D631 Anemia in chronic kidney disease: Secondary | ICD-10-CM | POA: Diagnosis not present

## 2019-01-08 DIAGNOSIS — D631 Anemia in chronic kidney disease: Secondary | ICD-10-CM | POA: Diagnosis not present

## 2019-01-08 DIAGNOSIS — N2581 Secondary hyperparathyroidism of renal origin: Secondary | ICD-10-CM | POA: Diagnosis not present

## 2019-01-08 DIAGNOSIS — N186 End stage renal disease: Secondary | ICD-10-CM | POA: Diagnosis not present

## 2019-01-13 DIAGNOSIS — N186 End stage renal disease: Secondary | ICD-10-CM | POA: Diagnosis not present

## 2019-01-13 DIAGNOSIS — D631 Anemia in chronic kidney disease: Secondary | ICD-10-CM | POA: Diagnosis not present

## 2019-01-13 DIAGNOSIS — N2581 Secondary hyperparathyroidism of renal origin: Secondary | ICD-10-CM | POA: Diagnosis not present

## 2019-01-15 DIAGNOSIS — N186 End stage renal disease: Secondary | ICD-10-CM | POA: Diagnosis not present

## 2019-01-15 DIAGNOSIS — D631 Anemia in chronic kidney disease: Secondary | ICD-10-CM | POA: Diagnosis not present

## 2019-01-15 DIAGNOSIS — N2581 Secondary hyperparathyroidism of renal origin: Secondary | ICD-10-CM | POA: Diagnosis not present

## 2019-01-18 DIAGNOSIS — N186 End stage renal disease: Secondary | ICD-10-CM | POA: Diagnosis not present

## 2019-01-18 DIAGNOSIS — N2581 Secondary hyperparathyroidism of renal origin: Secondary | ICD-10-CM | POA: Diagnosis not present

## 2019-01-18 DIAGNOSIS — D631 Anemia in chronic kidney disease: Secondary | ICD-10-CM | POA: Diagnosis not present

## 2019-01-20 DIAGNOSIS — D631 Anemia in chronic kidney disease: Secondary | ICD-10-CM | POA: Diagnosis not present

## 2019-01-20 DIAGNOSIS — N186 End stage renal disease: Secondary | ICD-10-CM | POA: Diagnosis not present

## 2019-01-20 DIAGNOSIS — N2581 Secondary hyperparathyroidism of renal origin: Secondary | ICD-10-CM | POA: Diagnosis not present

## 2019-01-22 DIAGNOSIS — N2581 Secondary hyperparathyroidism of renal origin: Secondary | ICD-10-CM | POA: Diagnosis not present

## 2019-01-22 DIAGNOSIS — D631 Anemia in chronic kidney disease: Secondary | ICD-10-CM | POA: Diagnosis not present

## 2019-01-22 DIAGNOSIS — N186 End stage renal disease: Secondary | ICD-10-CM | POA: Diagnosis not present

## 2019-01-25 DIAGNOSIS — N2581 Secondary hyperparathyroidism of renal origin: Secondary | ICD-10-CM | POA: Diagnosis not present

## 2019-01-25 DIAGNOSIS — N186 End stage renal disease: Secondary | ICD-10-CM | POA: Diagnosis not present

## 2019-01-25 DIAGNOSIS — D631 Anemia in chronic kidney disease: Secondary | ICD-10-CM | POA: Diagnosis not present

## 2019-01-26 DIAGNOSIS — I120 Hypertensive chronic kidney disease with stage 5 chronic kidney disease or end stage renal disease: Secondary | ICD-10-CM | POA: Diagnosis not present

## 2019-01-26 DIAGNOSIS — Z992 Dependence on renal dialysis: Secondary | ICD-10-CM | POA: Diagnosis not present

## 2019-01-26 DIAGNOSIS — N186 End stage renal disease: Secondary | ICD-10-CM | POA: Diagnosis not present

## 2019-01-27 DIAGNOSIS — N186 End stage renal disease: Secondary | ICD-10-CM | POA: Diagnosis not present

## 2019-01-27 DIAGNOSIS — D631 Anemia in chronic kidney disease: Secondary | ICD-10-CM | POA: Diagnosis not present

## 2019-01-27 DIAGNOSIS — N2581 Secondary hyperparathyroidism of renal origin: Secondary | ICD-10-CM | POA: Diagnosis not present

## 2019-01-29 DIAGNOSIS — N186 End stage renal disease: Secondary | ICD-10-CM | POA: Diagnosis not present

## 2019-01-29 DIAGNOSIS — N2581 Secondary hyperparathyroidism of renal origin: Secondary | ICD-10-CM | POA: Diagnosis not present

## 2019-01-29 DIAGNOSIS — D631 Anemia in chronic kidney disease: Secondary | ICD-10-CM | POA: Diagnosis not present

## 2019-02-01 DIAGNOSIS — N186 End stage renal disease: Secondary | ICD-10-CM | POA: Diagnosis not present

## 2019-02-01 DIAGNOSIS — N2581 Secondary hyperparathyroidism of renal origin: Secondary | ICD-10-CM | POA: Diagnosis not present

## 2019-02-01 DIAGNOSIS — D631 Anemia in chronic kidney disease: Secondary | ICD-10-CM | POA: Diagnosis not present

## 2019-02-03 DIAGNOSIS — D631 Anemia in chronic kidney disease: Secondary | ICD-10-CM | POA: Diagnosis not present

## 2019-02-03 DIAGNOSIS — N2581 Secondary hyperparathyroidism of renal origin: Secondary | ICD-10-CM | POA: Diagnosis not present

## 2019-02-03 DIAGNOSIS — N186 End stage renal disease: Secondary | ICD-10-CM | POA: Diagnosis not present

## 2019-02-05 DIAGNOSIS — N2581 Secondary hyperparathyroidism of renal origin: Secondary | ICD-10-CM | POA: Diagnosis not present

## 2019-02-05 DIAGNOSIS — D631 Anemia in chronic kidney disease: Secondary | ICD-10-CM | POA: Diagnosis not present

## 2019-02-05 DIAGNOSIS — N186 End stage renal disease: Secondary | ICD-10-CM | POA: Diagnosis not present

## 2019-02-08 DIAGNOSIS — N2581 Secondary hyperparathyroidism of renal origin: Secondary | ICD-10-CM | POA: Diagnosis not present

## 2019-02-08 DIAGNOSIS — N186 End stage renal disease: Secondary | ICD-10-CM | POA: Diagnosis not present

## 2019-02-08 DIAGNOSIS — D631 Anemia in chronic kidney disease: Secondary | ICD-10-CM | POA: Diagnosis not present

## 2019-02-10 DIAGNOSIS — N2581 Secondary hyperparathyroidism of renal origin: Secondary | ICD-10-CM | POA: Diagnosis not present

## 2019-02-10 DIAGNOSIS — N186 End stage renal disease: Secondary | ICD-10-CM | POA: Diagnosis not present

## 2019-02-10 DIAGNOSIS — E039 Hypothyroidism, unspecified: Secondary | ICD-10-CM | POA: Diagnosis not present

## 2019-02-10 DIAGNOSIS — D631 Anemia in chronic kidney disease: Secondary | ICD-10-CM | POA: Diagnosis not present

## 2019-02-12 DIAGNOSIS — D631 Anemia in chronic kidney disease: Secondary | ICD-10-CM | POA: Diagnosis not present

## 2019-02-12 DIAGNOSIS — N2581 Secondary hyperparathyroidism of renal origin: Secondary | ICD-10-CM | POA: Diagnosis not present

## 2019-02-12 DIAGNOSIS — N186 End stage renal disease: Secondary | ICD-10-CM | POA: Diagnosis not present

## 2019-02-15 DIAGNOSIS — D631 Anemia in chronic kidney disease: Secondary | ICD-10-CM | POA: Diagnosis not present

## 2019-02-15 DIAGNOSIS — N186 End stage renal disease: Secondary | ICD-10-CM | POA: Diagnosis not present

## 2019-02-15 DIAGNOSIS — N2581 Secondary hyperparathyroidism of renal origin: Secondary | ICD-10-CM | POA: Diagnosis not present

## 2019-02-17 DIAGNOSIS — N2581 Secondary hyperparathyroidism of renal origin: Secondary | ICD-10-CM | POA: Diagnosis not present

## 2019-02-17 DIAGNOSIS — N186 End stage renal disease: Secondary | ICD-10-CM | POA: Diagnosis not present

## 2019-02-17 DIAGNOSIS — D631 Anemia in chronic kidney disease: Secondary | ICD-10-CM | POA: Diagnosis not present

## 2019-02-19 DIAGNOSIS — N2581 Secondary hyperparathyroidism of renal origin: Secondary | ICD-10-CM | POA: Diagnosis not present

## 2019-02-19 DIAGNOSIS — N186 End stage renal disease: Secondary | ICD-10-CM | POA: Diagnosis not present

## 2019-02-19 DIAGNOSIS — D631 Anemia in chronic kidney disease: Secondary | ICD-10-CM | POA: Diagnosis not present

## 2019-02-22 DIAGNOSIS — N2581 Secondary hyperparathyroidism of renal origin: Secondary | ICD-10-CM | POA: Diagnosis not present

## 2019-02-22 DIAGNOSIS — N186 End stage renal disease: Secondary | ICD-10-CM | POA: Diagnosis not present

## 2019-02-22 DIAGNOSIS — D631 Anemia in chronic kidney disease: Secondary | ICD-10-CM | POA: Diagnosis not present

## 2019-02-24 DIAGNOSIS — N186 End stage renal disease: Secondary | ICD-10-CM | POA: Diagnosis not present

## 2019-02-24 DIAGNOSIS — N2581 Secondary hyperparathyroidism of renal origin: Secondary | ICD-10-CM | POA: Diagnosis not present

## 2019-02-24 DIAGNOSIS — D631 Anemia in chronic kidney disease: Secondary | ICD-10-CM | POA: Diagnosis not present

## 2019-02-26 DIAGNOSIS — Z992 Dependence on renal dialysis: Secondary | ICD-10-CM | POA: Diagnosis not present

## 2019-02-26 DIAGNOSIS — D509 Iron deficiency anemia, unspecified: Secondary | ICD-10-CM | POA: Diagnosis not present

## 2019-02-26 DIAGNOSIS — N2581 Secondary hyperparathyroidism of renal origin: Secondary | ICD-10-CM | POA: Diagnosis not present

## 2019-02-26 DIAGNOSIS — N186 End stage renal disease: Secondary | ICD-10-CM | POA: Diagnosis not present

## 2019-02-26 DIAGNOSIS — D631 Anemia in chronic kidney disease: Secondary | ICD-10-CM | POA: Diagnosis not present

## 2019-02-26 DIAGNOSIS — I120 Hypertensive chronic kidney disease with stage 5 chronic kidney disease or end stage renal disease: Secondary | ICD-10-CM | POA: Diagnosis not present

## 2019-03-01 DIAGNOSIS — N2581 Secondary hyperparathyroidism of renal origin: Secondary | ICD-10-CM | POA: Diagnosis not present

## 2019-03-01 DIAGNOSIS — N186 End stage renal disease: Secondary | ICD-10-CM | POA: Diagnosis not present

## 2019-03-01 DIAGNOSIS — D631 Anemia in chronic kidney disease: Secondary | ICD-10-CM | POA: Diagnosis not present

## 2019-03-01 DIAGNOSIS — Z992 Dependence on renal dialysis: Secondary | ICD-10-CM | POA: Diagnosis not present

## 2019-03-01 DIAGNOSIS — D509 Iron deficiency anemia, unspecified: Secondary | ICD-10-CM | POA: Diagnosis not present

## 2019-03-03 DIAGNOSIS — D509 Iron deficiency anemia, unspecified: Secondary | ICD-10-CM | POA: Diagnosis not present

## 2019-03-03 DIAGNOSIS — N2581 Secondary hyperparathyroidism of renal origin: Secondary | ICD-10-CM | POA: Diagnosis not present

## 2019-03-03 DIAGNOSIS — D631 Anemia in chronic kidney disease: Secondary | ICD-10-CM | POA: Diagnosis not present

## 2019-03-03 DIAGNOSIS — Z992 Dependence on renal dialysis: Secondary | ICD-10-CM | POA: Diagnosis not present

## 2019-03-03 DIAGNOSIS — N186 End stage renal disease: Secondary | ICD-10-CM | POA: Diagnosis not present

## 2019-03-05 DIAGNOSIS — N186 End stage renal disease: Secondary | ICD-10-CM | POA: Diagnosis not present

## 2019-03-05 DIAGNOSIS — Z992 Dependence on renal dialysis: Secondary | ICD-10-CM | POA: Diagnosis not present

## 2019-03-05 DIAGNOSIS — N2581 Secondary hyperparathyroidism of renal origin: Secondary | ICD-10-CM | POA: Diagnosis not present

## 2019-03-05 DIAGNOSIS — D631 Anemia in chronic kidney disease: Secondary | ICD-10-CM | POA: Diagnosis not present

## 2019-03-05 DIAGNOSIS — D509 Iron deficiency anemia, unspecified: Secondary | ICD-10-CM | POA: Diagnosis not present

## 2019-03-08 DIAGNOSIS — N2581 Secondary hyperparathyroidism of renal origin: Secondary | ICD-10-CM | POA: Diagnosis not present

## 2019-03-08 DIAGNOSIS — D631 Anemia in chronic kidney disease: Secondary | ICD-10-CM | POA: Diagnosis not present

## 2019-03-08 DIAGNOSIS — N186 End stage renal disease: Secondary | ICD-10-CM | POA: Diagnosis not present

## 2019-03-08 DIAGNOSIS — Z992 Dependence on renal dialysis: Secondary | ICD-10-CM | POA: Diagnosis not present

## 2019-03-08 DIAGNOSIS — D509 Iron deficiency anemia, unspecified: Secondary | ICD-10-CM | POA: Diagnosis not present

## 2019-03-10 DIAGNOSIS — D631 Anemia in chronic kidney disease: Secondary | ICD-10-CM | POA: Diagnosis not present

## 2019-03-10 DIAGNOSIS — M545 Low back pain: Secondary | ICD-10-CM | POA: Diagnosis not present

## 2019-03-10 DIAGNOSIS — R030 Elevated blood-pressure reading, without diagnosis of hypertension: Secondary | ICD-10-CM | POA: Diagnosis not present

## 2019-03-10 DIAGNOSIS — Z992 Dependence on renal dialysis: Secondary | ICD-10-CM | POA: Diagnosis not present

## 2019-03-10 DIAGNOSIS — N2581 Secondary hyperparathyroidism of renal origin: Secondary | ICD-10-CM | POA: Diagnosis not present

## 2019-03-10 DIAGNOSIS — D509 Iron deficiency anemia, unspecified: Secondary | ICD-10-CM | POA: Diagnosis not present

## 2019-03-10 DIAGNOSIS — N186 End stage renal disease: Secondary | ICD-10-CM | POA: Diagnosis not present

## 2019-03-12 DIAGNOSIS — D509 Iron deficiency anemia, unspecified: Secondary | ICD-10-CM | POA: Diagnosis not present

## 2019-03-12 DIAGNOSIS — D631 Anemia in chronic kidney disease: Secondary | ICD-10-CM | POA: Diagnosis not present

## 2019-03-12 DIAGNOSIS — Z992 Dependence on renal dialysis: Secondary | ICD-10-CM | POA: Diagnosis not present

## 2019-03-12 DIAGNOSIS — N2581 Secondary hyperparathyroidism of renal origin: Secondary | ICD-10-CM | POA: Diagnosis not present

## 2019-03-12 DIAGNOSIS — N186 End stage renal disease: Secondary | ICD-10-CM | POA: Diagnosis not present

## 2019-03-15 ENCOUNTER — Other Ambulatory Visit: Payer: Self-pay

## 2019-03-15 ENCOUNTER — Encounter (HOSPITAL_COMMUNITY): Payer: Self-pay

## 2019-03-15 ENCOUNTER — Emergency Department (HOSPITAL_COMMUNITY)
Admission: EM | Admit: 2019-03-15 | Discharge: 2019-03-15 | Disposition: A | Discharge status: Home / Self Care | Payer: Medicare Other | Attending: Emergency Medicine | Admitting: Emergency Medicine

## 2019-03-15 DIAGNOSIS — N390 Urinary tract infection, site not specified: Secondary | ICD-10-CM

## 2019-03-15 DIAGNOSIS — N186 End stage renal disease: Secondary | ICD-10-CM | POA: Diagnosis not present

## 2019-03-15 DIAGNOSIS — I120 Hypertensive chronic kidney disease with stage 5 chronic kidney disease or end stage renal disease: Secondary | ICD-10-CM | POA: Insufficient documentation

## 2019-03-15 DIAGNOSIS — D631 Anemia in chronic kidney disease: Secondary | ICD-10-CM | POA: Diagnosis not present

## 2019-03-15 DIAGNOSIS — R51 Headache: Secondary | ICD-10-CM | POA: Diagnosis not present

## 2019-03-15 DIAGNOSIS — Z992 Dependence on renal dialysis: Secondary | ICD-10-CM | POA: Insufficient documentation

## 2019-03-15 DIAGNOSIS — Z20828 Contact with and (suspected) exposure to other viral communicable diseases: Secondary | ICD-10-CM | POA: Insufficient documentation

## 2019-03-15 DIAGNOSIS — R519 Headache, unspecified: Secondary | ICD-10-CM

## 2019-03-15 DIAGNOSIS — Z91040 Latex allergy status: Secondary | ICD-10-CM | POA: Insufficient documentation

## 2019-03-15 DIAGNOSIS — Z87891 Personal history of nicotine dependence: Secondary | ICD-10-CM | POA: Diagnosis not present

## 2019-03-15 DIAGNOSIS — Z91010 Allergy to peanuts: Secondary | ICD-10-CM | POA: Insufficient documentation

## 2019-03-15 DIAGNOSIS — N2581 Secondary hyperparathyroidism of renal origin: Secondary | ICD-10-CM | POA: Diagnosis not present

## 2019-03-15 DIAGNOSIS — N12 Tubulo-interstitial nephritis, not specified as acute or chronic: Secondary | ICD-10-CM | POA: Insufficient documentation

## 2019-03-15 DIAGNOSIS — Z79899 Other long term (current) drug therapy: Secondary | ICD-10-CM | POA: Insufficient documentation

## 2019-03-15 DIAGNOSIS — R112 Nausea with vomiting, unspecified: Secondary | ICD-10-CM | POA: Diagnosis not present

## 2019-03-15 DIAGNOSIS — D509 Iron deficiency anemia, unspecified: Secondary | ICD-10-CM | POA: Diagnosis not present

## 2019-03-15 LAB — CBC WITH DIFFERENTIAL/PLATELET
Abs Immature Granulocytes: 0.01 10*3/uL (ref 0.00–0.07)
Basophils Absolute: 0 10*3/uL (ref 0.0–0.1)
Basophils Relative: 0 %
Eosinophils Absolute: 0.3 10*3/uL (ref 0.0–0.5)
Eosinophils Relative: 9 %
HCT: 37.3 % (ref 36.0–46.0)
Hemoglobin: 11.1 g/dL — ABNORMAL LOW (ref 12.0–15.0)
Immature Granulocytes: 0 %
Lymphocytes Relative: 35 %
Lymphs Abs: 1.3 10*3/uL (ref 0.7–4.0)
MCH: 27 pg (ref 26.0–34.0)
MCHC: 29.8 g/dL — ABNORMAL LOW (ref 30.0–36.0)
MCV: 90.8 fL (ref 80.0–100.0)
Monocytes Absolute: 0.9 10*3/uL (ref 0.1–1.0)
Monocytes Relative: 24 %
Neutro Abs: 1.2 10*3/uL — ABNORMAL LOW (ref 1.7–7.7)
Neutrophils Relative %: 32 %
Platelets: 208 10*3/uL (ref 150–400)
RBC: 4.11 MIL/uL (ref 3.87–5.11)
RDW: 17.5 % — ABNORMAL HIGH (ref 11.5–15.5)
WBC: 3.8 10*3/uL — ABNORMAL LOW (ref 4.0–10.5)
nRBC: 0 % (ref 0.0–0.2)

## 2019-03-15 LAB — COMPREHENSIVE METABOLIC PANEL
ALT: 9 U/L (ref 0–44)
AST: 13 U/L — ABNORMAL LOW (ref 15–41)
Albumin: 3.3 g/dL — ABNORMAL LOW (ref 3.5–5.0)
Alkaline Phosphatase: 121 U/L (ref 38–126)
Anion gap: 13 (ref 5–15)
BUN: 23 mg/dL — ABNORMAL HIGH (ref 6–20)
CO2: 25 mmol/L (ref 22–32)
Calcium: 8.8 mg/dL — ABNORMAL LOW (ref 8.9–10.3)
Chloride: 100 mmol/L (ref 98–111)
Creatinine, Ser: 4.28 mg/dL — ABNORMAL HIGH (ref 0.44–1.00)
GFR calc Af Amer: 16 mL/min — ABNORMAL LOW (ref >60)
GFR calc non Af Amer: 13 mL/min — ABNORMAL LOW (ref >60)
Glucose, Bld: 82 mg/dL (ref 70–99)
Potassium: 3.9 mmol/L (ref 3.5–5.1)
Sodium: 138 mmol/L (ref 135–145)
Total Bilirubin: 0.3 mg/dL (ref 0.3–1.2)
Total Protein: 7.9 g/dL (ref 6.5–8.1)

## 2019-03-15 LAB — URINALYSIS, ROUTINE W REFLEX MICROSCOPIC
Bilirubin Urine: NEGATIVE
Glucose, UA: NEGATIVE mg/dL
Ketones, ur: NEGATIVE mg/dL
Nitrite: POSITIVE — AB
Protein, ur: 100 mg/dL — AB
Specific Gravity, Urine: 1.006 (ref 1.005–1.030)
WBC, UA: >50 WBC/hpf — ABNORMAL HIGH (ref 0–5)
pH: 8 (ref 5.0–8.0)

## 2019-03-15 LAB — SARS CORONAVIRUS 2 BY RT PCR (HOSPITAL ORDER, PERFORMED IN ~~LOC~~ HOSPITAL LAB): SARS Coronavirus 2: NEGATIVE

## 2019-03-15 LAB — LACTIC ACID, PLASMA: Lactic Acid, Venous: 1.3 mmol/L (ref 0.5–1.9)

## 2019-03-15 MED ORDER — SULFAMETHOXAZOLE-TRIMETHOPRIM 400-80 MG PO TABS: 1.0000 | ORAL_TABLET | Freq: Two times a day (BID) | ORAL | 0 refills | Status: DC

## 2019-03-15 MED ORDER — SULFAMETHOXAZOLE-TRIMETHOPRIM 800-160 MG PO TABS
1.0000 | ORAL_TABLET | Freq: Once | ORAL | Status: AC
  Administered 2019-03-15: 1 via ORAL
  Filled 2019-03-15: qty 1

## 2019-03-15 MED ORDER — HYDROCODONE-ACETAMINOPHEN 5-325 MG PO TABS
1.0000 | ORAL_TABLET | Freq: Once | ORAL | Status: AC
  Administered 2019-03-15: 17:00:00 1 via ORAL
  Filled 2019-03-15: qty 1

## 2019-03-15 MED ORDER — ONDANSETRON HCL 4 MG PO TABS
4.0000 mg | ORAL_TABLET | Freq: Once | ORAL | Status: AC
  Administered 2019-03-15: 4 mg via ORAL
  Filled 2019-03-15: qty 1

## 2019-03-15 MED ORDER — TRAMADOL HCL 50 MG PO TABS: 50.0000 mg | ORAL_TABLET | Freq: Three times a day (TID) | ORAL | 0 refills | Status: DC | PRN

## 2019-03-15 NOTE — Discharge Instructions (Addendum)
Your urine test questions a urinary tract infection.  We have sent a culture to the lab.  Your complete blood count is within normal limits.  Please use Bactrim 2 times daily with food.  Use Tylenol extra strength for headache.  Use Ultram 3 times daily as needed for more severe pain.  Please see your primary physician or return to the emergency department if any worsening of your symptoms, any high fever, changes in your condition, problems or concerns.

## 2019-03-15 NOTE — ED Provider Notes (Signed)
[facility] EMERGENCY DEPARTMENT Provider Note   CSN: [id number] Arrival date & time: 03/15/19  1534     History   Chief Complaint Chief Complaint  Patient presents with  . Headache    HPI [name] is a 28 y.o. female.     Patient is a 28 year old female who presents to the emergency department with a complaint of headache and ear pain.  The patient has a history of anemia of chronic disease, end-stage renal disease requiring dialysis.  Spina bifida, with caudal regression syndrome, and depression.  The patient states that she has been having headache, ear pain and lower back pain for the past week.  She says at times she has had nausea with vomiting.  She complains of fatigue that is a little more than her usual fatigue following dialysis.  She has had some back pain.  The pain is worse with bright lights, particularly computer screens and other bright lights.  She at times gets relief from Tylenol.  Recently she has not been able to get relief with Tylenol or rest.  She was evaluated approximately 8 or 9 days ago for urinary tract infection, and the results were inconclusive.  She is not had fever that she is aware of.  She presents to the emergency department for assistance with her headache and to obtain an evaluation concerning her urine.  The history is provided by the patient.    Past Medical History:  Diagnosis Date  . Anemia associated with chronic renal failure   . Blood transfusion   . 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  . Depression 05/14/2015  . Dialysis care   . ESRD (end stage renal disease) on dialysis ([place])   . GERD (gastroesophageal reflux disease) 01/07/2017  . HTN (hypertension) 05/02/2011  . Spina bifida   . UTI (lower urinary tract infection)     Patient Active Problem List   Diagnosis Date Noted  . Acute UTI 05/07/2018  . Tobacco use 05/07/2018  . Hypokalemia 05/07/2018  . GERD (gastroesophageal reflux disease)  01/07/2017  . Nausea with vomiting 01/07/2017  . Pyelonephritis 07/31/2015  . Back pain 07/31/2015  . Abdominal pain 07/31/2015  . Constipation 07/31/2015  . Major depressive disorder, single episode, severe without psychotic features ([place]) 05/15/2015  . Spina bifida ([place]) 01/18/2012  . ESRD (end stage renal disease) on dialysis ([place]) 05/02/2011  . Anemia 05/02/2011  . HTN (hypertension) 05/02/2011    Past Surgical History:  Procedure Laterality Date  . AV FISTULA PLACEMENT     left arm     OB History    Gravida  0   Para  0   Term  0   Preterm  0   AB  0   Living  0     SAB  0   TAB  0   Ectopic  0   Multiple  0   Live Births  0            Home Medications    Prior to Admission medications   Medication Sig Start Date End Date Taking? Authorizing Provider  AURYXIA 1 GM 210 MG(Fe) tablet Take 2 tablets by mouth 3 (three) times daily with meals. 04/15/18   [provider]  diphenhydrAMINE (BENYLIN) 12.5 MG/5ML syrup Take 5 mLs (12.5 mg total) by mouth 4 (four) times daily as needed for itching or allergies. 09/12/18   M[place], Corene Cornea, MD  Epoetin Alfa (EPOGEN IJ) Inject as directed  See admin instructions. Every Tuesday, Thursday, Saturday    [provider]  multivitamin (RENA-VIT) TABS tablet Take 1 tablet by mouth See admin instructions. Every Tuesday, Thursday, Saturday    [provider]  omeprazole (PRILOSEC) 20 MG capsule Take 20 mg by mouth 2 (two) times daily before a meal.  03/26/16   [provider]    Family History Family History  Problem Relation Age of Onset  . Kidney cancer Other   . Hypertension Maternal Grandmother   . Arthritis Maternal Grandmother   . Breast cancer Maternal Aunt   . Colon cancer Neg Hx     Social History Social History   Tobacco Use  . Smoking status: Former Smoker    Packs/day: 0.10    Types: Cigarettes  . Smokeless tobacco: Never Used  . Tobacco comment: 2 cigs a day   Substance Use Topics  . Alcohol use: No  . Drug use: No     Allergies   Ciprofloxacin; Other; Peanut-containing drug products; Aleve [naproxen sodium]; Ceftriaxone; Coconut oil; Influenza vaccines; Tetanus toxoid, adsorbed; Tetanus toxoids; and Latex   Review of Systems Review of Systems  Constitutional: Negative for activity change.       All ROS Neg except as noted in HPI  HENT: Positive for ear pain.   Eyes: Negative for photophobia and discharge.  Respiratory: Negative for cough, shortness of breath and wheezing.   Cardiovascular: Negative for chest pain and palpitations.  Gastrointestinal: Positive for nausea and vomiting. Negative for abdominal pain and blood in stool.  Genitourinary: Negative for dysuria, frequency and hematuria.  Musculoskeletal: Positive for back pain. Negative for arthralgias and neck pain.  Skin: Negative.   Neurological: Negative for dizziness, seizures and speech difficulty.  Psychiatric/Behavioral: Negative for confusion and hallucinations.     Physical Exam Updated Vital Signs BP 130/81 (BP Location: Right Arm)   Pulse 94   Temp 98.8 F (37.1 C) (Oral)   Resp 17   Wt 28.1 kg   SpO2 100%   BMI 75.68 kg/m   Physical Exam Vitals signs and nursing note reviewed.  Constitutional:      Appearance: She is well-developed. She is not toxic-appearing.  HENT:     Head: Normocephalic.  Eyes:     General: Lids are normal.     Pupils: Pupils are equal, round, and reactive to light.  Neck:     Musculoskeletal: Normal range of motion and neck supple.     Vascular: No carotid bruit.  Cardiovascular:     Rate and Rhythm: Normal rate and regular rhythm.     Pulses: Normal pulses.     Heart sounds: Normal heart sounds.  Pulmonary:     Effort: No respiratory distress.     Breath sounds: Normal breath sounds.  Abdominal:     General: Bowel sounds are normal.     Palpations: Abdomen is soft.     Tenderness: There is right CVA tenderness. There is  no guarding.  Musculoskeletal: Normal range of motion.     Comments: No lower extremities  Lymphadenopathy:     Head:     Right side of head: No submandibular adenopathy.     Left side of head: No submandibular adenopathy.     Cervical: No cervical adenopathy.  Skin:    General: Skin is warm and dry.  Neurological:     Mental Status: She is alert and oriented to person, place, and time.     GCS: GCS eye subscore is  4. GCS verbal subscore is 5. GCS motor subscore is 6.     Cranial Nerves: No cranial nerve deficit or facial asymmetry.     Sensory: No sensory deficit.     Coordination: Coordination is intact.  Psychiatric:        Speech: Speech normal.      ED Treatments / Results  Labs (all labs ordered are listed, but only abnormal results are displayed) Labs Reviewed  NOVEL CORONAVIRUS, NAA (HOSPITAL ORDER, SEND-OUT TO REF LAB)  URINALYSIS, ROUTINE W REFLEX MICROSCOPIC    EKG None  Radiology No results found.  Procedures Procedures (including critical care time)  Medications Ordered in ED Medications  HYDROcodone-acetaminophen (NORCO/VICODIN) 5-325 MG per tablet 1 tablet (has no administration in time range)  ondansetron (ZOFRAN) tablet 4 mg (has no administration in time range)     Initial Impression / Assessment and Plan / ED Course  I have reviewed the triage vital signs and the nursing notes.  Pertinent labs & imaging results that were available during my care of the patient were reviewed by me and considered in my medical decision making (see chart for details).          Final Clinical Impressions(s) / ED Diagnoses MDM  Vital signs are within normal limits.  Pulse oximetry is 100% on room air.  Within normal limits by my interpretation.  The patient complains of headache primarily over the left eye.  There are no gross neurologic deficits appreciated at this time.  As noted above the vital signs are within normal limits.  Patient has light  sensitivity, and lights were turned out in the examination room.  Urine analysis from a catheterized sample shows a cloudy yellow specimen with a specific gravity of 1.006.  There is a small amount of hemoglobin present.  There is the patient has nitrate positive and leukocyte esterase moderate.  There is 6-10 red blood cells per high-powered field.  There is greater than 50 white blood cells per high-powered field and rare bacteria.  The patient is noted to have white blood cells in clumps on the urine specimen.  I discussed with the patient it is quite possible that her headache may be related to her urinary tract infection/pyelonephritis.  Pt request to be admitted to the hospital for antibiotics and medication for headache. Will discussed the case with the hospitalist.  We will try  Bactrim DS. Culture sent to the lab. Pt to follow up with primary MD. Pt to use tylenol for mild pain. Ultram to be used for more severe pain. Pt will return to ED if any changes in condition or worsening of symptoms. Pt in agreement with plan.   Final diagnoses:  Pyelonephritis  Bad headache    ED Discharge Orders         Ordered    sulfamethoxazole-trimethoprim (BACTRIM) 400-80 MG tablet  2 times daily     03/15/19 1807    traMADol (ULTRAM) 50 MG tablet  3 times daily PRN     03/15/19 1807           Lily Kocher, PA-C 03/15/19 1815    Daleen Bo, MD 03/15/19 2315

## 2019-03-15 NOTE — ED Triage Notes (Signed)
Pt has been having a headache, ear pain, and lower back pain for the last week. NAD. Was told to come to ED to give a urine sample as well.

## 2019-03-15 NOTE — Progress Notes (Addendum)
Called to admit patient [name], with hx of ESRD, hypertension, depression, spina bifida, who presented to the ED with complaints of headache, ear pain, low back pain of 1 week duration.  She was also sent to the ED to have her urine evaluated for UTI.  Patient self caths.  Per chart review-temperature 98.8, heart rate 94, respiratory rate 17, blood pressure 130/81, with O2 sat 100% on room air.  Blood work including WBC- 3.8 ( recent baseline 4.1 - 5.5),  Lactic acid- 1.3. COVID-19 pending.  Patient's UA showed 6-10 squames, moderate leukocytes, rare bacteria with positive nitrites and leukocytes. CMP pending. ED provider called me to admit the patient for UTI as patient has had reactions-blisters in the mouth to ceftriaxone and ciprofloxacin in the past.  Patient said if we are starting a new medication as she is wheelchair-bound she would prefer to be admitted for this.   On brief chart review, patient has tolerated both Bactrim, in February 2020 and Augmentin 04/2018 for UTIs.  Prior urine cultures have grown Pseudomonas.  Patient is most likely colonized as she is ESRD with oliguria and self caths.  At this time she does not meet criteria for admission, she is afebrile with stable vitals, unremarkable blood work, tolerating PO intake.  ED provider to determine disposition, need for further imaging, and antibiotic choice.   Arlyce Dice, MD. TRH 5:57 PM 03/15/19  NO CHARGE.

## 2019-03-19 DIAGNOSIS — D509 Iron deficiency anemia, unspecified: Secondary | ICD-10-CM | POA: Diagnosis not present

## 2019-03-19 DIAGNOSIS — Z992 Dependence on renal dialysis: Secondary | ICD-10-CM | POA: Diagnosis not present

## 2019-03-19 DIAGNOSIS — N2581 Secondary hyperparathyroidism of renal origin: Secondary | ICD-10-CM | POA: Diagnosis not present

## 2019-03-19 DIAGNOSIS — N186 End stage renal disease: Secondary | ICD-10-CM | POA: Diagnosis not present

## 2019-03-19 DIAGNOSIS — D631 Anemia in chronic kidney disease: Secondary | ICD-10-CM | POA: Diagnosis not present

## 2019-03-21 DIAGNOSIS — H6011 Cellulitis of right external ear: Secondary | ICD-10-CM | POA: Diagnosis not present

## 2019-03-22 DIAGNOSIS — N2581 Secondary hyperparathyroidism of renal origin: Secondary | ICD-10-CM | POA: Diagnosis not present

## 2019-03-22 DIAGNOSIS — D631 Anemia in chronic kidney disease: Secondary | ICD-10-CM | POA: Diagnosis not present

## 2019-03-22 DIAGNOSIS — D509 Iron deficiency anemia, unspecified: Secondary | ICD-10-CM | POA: Diagnosis not present

## 2019-03-22 DIAGNOSIS — Z992 Dependence on renal dialysis: Secondary | ICD-10-CM | POA: Diagnosis not present

## 2019-03-22 DIAGNOSIS — N186 End stage renal disease: Secondary | ICD-10-CM | POA: Diagnosis not present

## 2019-03-24 DIAGNOSIS — N2581 Secondary hyperparathyroidism of renal origin: Secondary | ICD-10-CM | POA: Diagnosis not present

## 2019-03-24 DIAGNOSIS — Z992 Dependence on renal dialysis: Secondary | ICD-10-CM | POA: Diagnosis not present

## 2019-03-24 DIAGNOSIS — D631 Anemia in chronic kidney disease: Secondary | ICD-10-CM | POA: Diagnosis not present

## 2019-03-24 DIAGNOSIS — D509 Iron deficiency anemia, unspecified: Secondary | ICD-10-CM | POA: Diagnosis not present

## 2019-03-24 DIAGNOSIS — N186 End stage renal disease: Secondary | ICD-10-CM | POA: Diagnosis not present

## 2019-03-29 DIAGNOSIS — D631 Anemia in chronic kidney disease: Secondary | ICD-10-CM | POA: Diagnosis not present

## 2019-03-29 DIAGNOSIS — I120 Hypertensive chronic kidney disease with stage 5 chronic kidney disease or end stage renal disease: Secondary | ICD-10-CM | POA: Diagnosis not present

## 2019-03-29 DIAGNOSIS — D509 Iron deficiency anemia, unspecified: Secondary | ICD-10-CM | POA: Diagnosis not present

## 2019-03-29 DIAGNOSIS — Z992 Dependence on renal dialysis: Secondary | ICD-10-CM | POA: Diagnosis not present

## 2019-03-29 DIAGNOSIS — T886XXA Anaphylactic reaction due to adverse effect of correct drug or medicament properly administered, initial encounter: Secondary | ICD-10-CM | POA: Diagnosis not present

## 2019-03-29 DIAGNOSIS — N186 End stage renal disease: Secondary | ICD-10-CM | POA: Diagnosis not present

## 2019-03-29 DIAGNOSIS — N2581 Secondary hyperparathyroidism of renal origin: Secondary | ICD-10-CM | POA: Diagnosis not present

## 2019-03-31 DIAGNOSIS — Z992 Dependence on renal dialysis: Secondary | ICD-10-CM | POA: Diagnosis not present

## 2019-03-31 DIAGNOSIS — N2581 Secondary hyperparathyroidism of renal origin: Secondary | ICD-10-CM | POA: Diagnosis not present

## 2019-03-31 DIAGNOSIS — N186 End stage renal disease: Secondary | ICD-10-CM | POA: Diagnosis not present

## 2019-03-31 DIAGNOSIS — D509 Iron deficiency anemia, unspecified: Secondary | ICD-10-CM | POA: Diagnosis not present

## 2019-03-31 DIAGNOSIS — T886XXA Anaphylactic reaction due to adverse effect of correct drug or medicament properly administered, initial encounter: Secondary | ICD-10-CM | POA: Diagnosis not present

## 2019-04-02 DIAGNOSIS — N2581 Secondary hyperparathyroidism of renal origin: Secondary | ICD-10-CM | POA: Diagnosis not present

## 2019-04-02 DIAGNOSIS — T886XXA Anaphylactic reaction due to adverse effect of correct drug or medicament properly administered, initial encounter: Secondary | ICD-10-CM | POA: Diagnosis not present

## 2019-04-02 DIAGNOSIS — Z992 Dependence on renal dialysis: Secondary | ICD-10-CM | POA: Diagnosis not present

## 2019-04-02 DIAGNOSIS — D509 Iron deficiency anemia, unspecified: Secondary | ICD-10-CM | POA: Diagnosis not present

## 2019-04-02 DIAGNOSIS — N186 End stage renal disease: Secondary | ICD-10-CM | POA: Diagnosis not present

## 2019-04-05 DIAGNOSIS — N2581 Secondary hyperparathyroidism of renal origin: Secondary | ICD-10-CM | POA: Diagnosis not present

## 2019-04-05 DIAGNOSIS — N186 End stage renal disease: Secondary | ICD-10-CM | POA: Diagnosis not present

## 2019-04-05 DIAGNOSIS — D509 Iron deficiency anemia, unspecified: Secondary | ICD-10-CM | POA: Diagnosis not present

## 2019-04-05 DIAGNOSIS — Z992 Dependence on renal dialysis: Secondary | ICD-10-CM | POA: Diagnosis not present

## 2019-04-05 DIAGNOSIS — T886XXA Anaphylactic reaction due to adverse effect of correct drug or medicament properly administered, initial encounter: Secondary | ICD-10-CM | POA: Diagnosis not present

## 2019-04-06 DIAGNOSIS — H6011 Cellulitis of right external ear: Secondary | ICD-10-CM | POA: Diagnosis not present

## 2019-04-07 DIAGNOSIS — Z992 Dependence on renal dialysis: Secondary | ICD-10-CM | POA: Diagnosis not present

## 2019-04-07 DIAGNOSIS — D509 Iron deficiency anemia, unspecified: Secondary | ICD-10-CM | POA: Diagnosis not present

## 2019-04-07 DIAGNOSIS — N186 End stage renal disease: Secondary | ICD-10-CM | POA: Diagnosis not present

## 2019-04-07 DIAGNOSIS — T886XXA Anaphylactic reaction due to adverse effect of correct drug or medicament properly administered, initial encounter: Secondary | ICD-10-CM | POA: Diagnosis not present

## 2019-04-07 DIAGNOSIS — N2581 Secondary hyperparathyroidism of renal origin: Secondary | ICD-10-CM | POA: Diagnosis not present

## 2019-04-09 DIAGNOSIS — N186 End stage renal disease: Secondary | ICD-10-CM | POA: Diagnosis not present

## 2019-04-09 DIAGNOSIS — T886XXA Anaphylactic reaction due to adverse effect of correct drug or medicament properly administered, initial encounter: Secondary | ICD-10-CM | POA: Diagnosis not present

## 2019-04-09 DIAGNOSIS — N2581 Secondary hyperparathyroidism of renal origin: Secondary | ICD-10-CM | POA: Diagnosis not present

## 2019-04-09 DIAGNOSIS — D509 Iron deficiency anemia, unspecified: Secondary | ICD-10-CM | POA: Diagnosis not present

## 2019-04-09 DIAGNOSIS — Z992 Dependence on renal dialysis: Secondary | ICD-10-CM | POA: Diagnosis not present

## 2019-04-12 DIAGNOSIS — N186 End stage renal disease: Secondary | ICD-10-CM | POA: Diagnosis not present

## 2019-04-12 DIAGNOSIS — D509 Iron deficiency anemia, unspecified: Secondary | ICD-10-CM | POA: Diagnosis not present

## 2019-04-12 DIAGNOSIS — Z992 Dependence on renal dialysis: Secondary | ICD-10-CM | POA: Diagnosis not present

## 2019-04-12 DIAGNOSIS — N2581 Secondary hyperparathyroidism of renal origin: Secondary | ICD-10-CM | POA: Diagnosis not present

## 2019-04-12 DIAGNOSIS — T886XXA Anaphylactic reaction due to adverse effect of correct drug or medicament properly administered, initial encounter: Secondary | ICD-10-CM | POA: Diagnosis not present

## 2019-04-14 DIAGNOSIS — T886XXA Anaphylactic reaction due to adverse effect of correct drug or medicament properly administered, initial encounter: Secondary | ICD-10-CM | POA: Diagnosis not present

## 2019-04-14 DIAGNOSIS — D509 Iron deficiency anemia, unspecified: Secondary | ICD-10-CM | POA: Diagnosis not present

## 2019-04-14 DIAGNOSIS — N186 End stage renal disease: Secondary | ICD-10-CM | POA: Diagnosis not present

## 2019-04-14 DIAGNOSIS — N2581 Secondary hyperparathyroidism of renal origin: Secondary | ICD-10-CM | POA: Diagnosis not present

## 2019-04-14 DIAGNOSIS — Z992 Dependence on renal dialysis: Secondary | ICD-10-CM | POA: Diagnosis not present

## 2019-04-16 DIAGNOSIS — T886XXA Anaphylactic reaction due to adverse effect of correct drug or medicament properly administered, initial encounter: Secondary | ICD-10-CM | POA: Diagnosis not present

## 2019-04-16 DIAGNOSIS — D509 Iron deficiency anemia, unspecified: Secondary | ICD-10-CM | POA: Diagnosis not present

## 2019-04-16 DIAGNOSIS — N2581 Secondary hyperparathyroidism of renal origin: Secondary | ICD-10-CM | POA: Diagnosis not present

## 2019-04-16 DIAGNOSIS — N186 End stage renal disease: Secondary | ICD-10-CM | POA: Diagnosis not present

## 2019-04-16 DIAGNOSIS — Z992 Dependence on renal dialysis: Secondary | ICD-10-CM | POA: Diagnosis not present

## 2019-04-19 DIAGNOSIS — N186 End stage renal disease: Secondary | ICD-10-CM | POA: Diagnosis not present

## 2019-04-19 DIAGNOSIS — Z992 Dependence on renal dialysis: Secondary | ICD-10-CM | POA: Diagnosis not present

## 2019-04-19 DIAGNOSIS — N2581 Secondary hyperparathyroidism of renal origin: Secondary | ICD-10-CM | POA: Diagnosis not present

## 2019-04-19 DIAGNOSIS — T886XXA Anaphylactic reaction due to adverse effect of correct drug or medicament properly administered, initial encounter: Secondary | ICD-10-CM | POA: Diagnosis not present

## 2019-04-19 DIAGNOSIS — D509 Iron deficiency anemia, unspecified: Secondary | ICD-10-CM | POA: Diagnosis not present

## 2019-04-21 DIAGNOSIS — N2581 Secondary hyperparathyroidism of renal origin: Secondary | ICD-10-CM | POA: Diagnosis not present

## 2019-04-21 DIAGNOSIS — Z992 Dependence on renal dialysis: Secondary | ICD-10-CM | POA: Diagnosis not present

## 2019-04-21 DIAGNOSIS — N186 End stage renal disease: Secondary | ICD-10-CM | POA: Diagnosis not present

## 2019-04-21 DIAGNOSIS — T886XXA Anaphylactic reaction due to adverse effect of correct drug or medicament properly administered, initial encounter: Secondary | ICD-10-CM | POA: Diagnosis not present

## 2019-04-21 DIAGNOSIS — D509 Iron deficiency anemia, unspecified: Secondary | ICD-10-CM | POA: Diagnosis not present

## 2019-04-26 DIAGNOSIS — D509 Iron deficiency anemia, unspecified: Secondary | ICD-10-CM | POA: Diagnosis not present

## 2019-04-26 DIAGNOSIS — N2581 Secondary hyperparathyroidism of renal origin: Secondary | ICD-10-CM | POA: Diagnosis not present

## 2019-04-26 DIAGNOSIS — N186 End stage renal disease: Secondary | ICD-10-CM | POA: Diagnosis not present

## 2019-04-26 DIAGNOSIS — T886XXA Anaphylactic reaction due to adverse effect of correct drug or medicament properly administered, initial encounter: Secondary | ICD-10-CM | POA: Diagnosis not present

## 2019-04-26 DIAGNOSIS — Z992 Dependence on renal dialysis: Secondary | ICD-10-CM | POA: Diagnosis not present

## 2019-04-28 DIAGNOSIS — N186 End stage renal disease: Secondary | ICD-10-CM | POA: Diagnosis not present

## 2019-04-28 DIAGNOSIS — I120 Hypertensive chronic kidney disease with stage 5 chronic kidney disease or end stage renal disease: Secondary | ICD-10-CM | POA: Diagnosis not present

## 2019-04-28 DIAGNOSIS — N2581 Secondary hyperparathyroidism of renal origin: Secondary | ICD-10-CM | POA: Diagnosis not present

## 2019-04-28 DIAGNOSIS — Z992 Dependence on renal dialysis: Secondary | ICD-10-CM | POA: Diagnosis not present

## 2019-04-30 DIAGNOSIS — N186 End stage renal disease: Secondary | ICD-10-CM | POA: Diagnosis not present

## 2019-04-30 DIAGNOSIS — N2581 Secondary hyperparathyroidism of renal origin: Secondary | ICD-10-CM | POA: Diagnosis not present

## 2019-04-30 DIAGNOSIS — Z992 Dependence on renal dialysis: Secondary | ICD-10-CM | POA: Diagnosis not present

## 2019-05-03 DIAGNOSIS — N186 End stage renal disease: Secondary | ICD-10-CM | POA: Diagnosis not present

## 2019-05-03 DIAGNOSIS — N2581 Secondary hyperparathyroidism of renal origin: Secondary | ICD-10-CM | POA: Diagnosis not present

## 2019-05-03 DIAGNOSIS — Z992 Dependence on renal dialysis: Secondary | ICD-10-CM | POA: Diagnosis not present

## 2019-05-05 DIAGNOSIS — N186 End stage renal disease: Secondary | ICD-10-CM | POA: Diagnosis not present

## 2019-05-05 DIAGNOSIS — Z992 Dependence on renal dialysis: Secondary | ICD-10-CM | POA: Diagnosis not present

## 2019-05-05 DIAGNOSIS — N2581 Secondary hyperparathyroidism of renal origin: Secondary | ICD-10-CM | POA: Diagnosis not present

## 2019-05-06 DIAGNOSIS — N186 End stage renal disease: Secondary | ICD-10-CM | POA: Diagnosis not present

## 2019-05-06 DIAGNOSIS — N2581 Secondary hyperparathyroidism of renal origin: Secondary | ICD-10-CM | POA: Diagnosis not present

## 2019-05-06 DIAGNOSIS — Z992 Dependence on renal dialysis: Secondary | ICD-10-CM | POA: Diagnosis not present

## 2019-05-10 DIAGNOSIS — N186 End stage renal disease: Secondary | ICD-10-CM | POA: Diagnosis not present

## 2019-05-10 DIAGNOSIS — N2581 Secondary hyperparathyroidism of renal origin: Secondary | ICD-10-CM | POA: Diagnosis not present

## 2019-05-10 DIAGNOSIS — Z992 Dependence on renal dialysis: Secondary | ICD-10-CM | POA: Diagnosis not present

## 2019-05-12 DIAGNOSIS — N2581 Secondary hyperparathyroidism of renal origin: Secondary | ICD-10-CM | POA: Diagnosis not present

## 2019-05-12 DIAGNOSIS — Z992 Dependence on renal dialysis: Secondary | ICD-10-CM | POA: Diagnosis not present

## 2019-05-12 DIAGNOSIS — N186 End stage renal disease: Secondary | ICD-10-CM | POA: Diagnosis not present

## 2019-05-14 DIAGNOSIS — N2581 Secondary hyperparathyroidism of renal origin: Secondary | ICD-10-CM | POA: Diagnosis not present

## 2019-05-14 DIAGNOSIS — Z992 Dependence on renal dialysis: Secondary | ICD-10-CM | POA: Diagnosis not present

## 2019-05-14 DIAGNOSIS — N186 End stage renal disease: Secondary | ICD-10-CM | POA: Diagnosis not present

## 2019-05-18 DIAGNOSIS — N2581 Secondary hyperparathyroidism of renal origin: Secondary | ICD-10-CM | POA: Diagnosis not present

## 2019-05-18 DIAGNOSIS — N186 End stage renal disease: Secondary | ICD-10-CM | POA: Diagnosis not present

## 2019-05-18 DIAGNOSIS — Z992 Dependence on renal dialysis: Secondary | ICD-10-CM | POA: Diagnosis not present

## 2019-05-19 DIAGNOSIS — N2581 Secondary hyperparathyroidism of renal origin: Secondary | ICD-10-CM | POA: Diagnosis not present

## 2019-05-19 DIAGNOSIS — E039 Hypothyroidism, unspecified: Secondary | ICD-10-CM | POA: Diagnosis not present

## 2019-05-19 DIAGNOSIS — N186 End stage renal disease: Secondary | ICD-10-CM | POA: Diagnosis not present

## 2019-05-19 DIAGNOSIS — Z992 Dependence on renal dialysis: Secondary | ICD-10-CM | POA: Diagnosis not present

## 2019-05-21 DIAGNOSIS — Z992 Dependence on renal dialysis: Secondary | ICD-10-CM | POA: Diagnosis not present

## 2019-05-21 DIAGNOSIS — N2581 Secondary hyperparathyroidism of renal origin: Secondary | ICD-10-CM | POA: Diagnosis not present

## 2019-05-21 DIAGNOSIS — N186 End stage renal disease: Secondary | ICD-10-CM | POA: Diagnosis not present

## 2019-05-24 ENCOUNTER — Emergency Department (HOSPITAL_COMMUNITY)
Admission: EM | Admit: 2019-05-24 | Discharge: 2019-05-25 | Disposition: A | Discharge status: Home / Self Care | Payer: Medicare Other | Attending: Emergency Medicine | Admitting: Emergency Medicine

## 2019-05-24 ENCOUNTER — Encounter (HOSPITAL_COMMUNITY): Payer: Self-pay | Admitting: Emergency Medicine

## 2019-05-24 ENCOUNTER — Other Ambulatory Visit: Payer: Self-pay

## 2019-05-24 DIAGNOSIS — Z91040 Latex allergy status: Secondary | ICD-10-CM | POA: Diagnosis not present

## 2019-05-24 DIAGNOSIS — I120 Hypertensive chronic kidney disease with stage 5 chronic kidney disease or end stage renal disease: Secondary | ICD-10-CM | POA: Diagnosis not present

## 2019-05-24 DIAGNOSIS — Z79899 Other long term (current) drug therapy: Secondary | ICD-10-CM | POA: Insufficient documentation

## 2019-05-24 DIAGNOSIS — Z992 Dependence on renal dialysis: Secondary | ICD-10-CM | POA: Diagnosis not present

## 2019-05-24 DIAGNOSIS — Z91010 Allergy to peanuts: Secondary | ICD-10-CM | POA: Diagnosis not present

## 2019-05-24 DIAGNOSIS — N186 End stage renal disease: Secondary | ICD-10-CM | POA: Insufficient documentation

## 2019-05-24 DIAGNOSIS — N2581 Secondary hyperparathyroidism of renal origin: Secondary | ICD-10-CM | POA: Diagnosis not present

## 2019-05-24 DIAGNOSIS — R202 Paresthesia of skin: Secondary | ICD-10-CM | POA: Insufficient documentation

## 2019-05-24 DIAGNOSIS — Z87891 Personal history of nicotine dependence: Secondary | ICD-10-CM | POA: Diagnosis not present

## 2019-05-24 DIAGNOSIS — M542 Cervicalgia: Secondary | ICD-10-CM | POA: Diagnosis present

## 2019-05-24 DIAGNOSIS — M5412 Radiculopathy, cervical region: Secondary | ICD-10-CM | POA: Diagnosis not present

## 2019-05-24 NOTE — ED Triage Notes (Signed)
Pt c/o neck pain that radiates down to back and right shoulder x 30 mins. Pt denies any injury.

## 2019-05-25 ENCOUNTER — Emergency Department (HOSPITAL_COMMUNITY): Payer: Medicare Other

## 2019-05-25 DIAGNOSIS — M5412 Radiculopathy, cervical region: Secondary | ICD-10-CM | POA: Diagnosis not present

## 2019-05-25 DIAGNOSIS — M542 Cervicalgia: Secondary | ICD-10-CM | POA: Diagnosis not present

## 2019-05-25 MED ORDER — METHOCARBAMOL 500 MG PO TABS: 500.0000 mg | ORAL_TABLET | Freq: Once | ORAL | Status: DC

## 2019-05-25 MED ORDER — CYCLOBENZAPRINE HCL 10 MG PO TABS
10.0000 mg | ORAL_TABLET | Freq: Once | ORAL | Status: AC
  Administered 2019-05-25: 10 mg via ORAL
  Filled 2019-05-25: qty 1

## 2019-05-25 MED ORDER — CYCLOBENZAPRINE HCL 5 MG PO TABS: 5.0000 mg | ORAL_TABLET | Freq: Two times a day (BID) | ORAL | 0 refills | Status: DC

## 2019-05-25 NOTE — ED Provider Notes (Signed)
[facility] EMERGENCY DEPARTMENT Provider Note   CSN: [id number] Arrival date & time: 05/24/19  2311     History   Chief Complaint Chief Complaint  Patient presents with  . Neck Pain    HPI [name] is a 28 y.o. female with a history significant for spina bifida with caudal regression syndrome, wheelchair bound, also end-stage renal disease on dialysis which she completed today, presenting with sudden onset of right-sided neck and upper back pain with radiation of tingling into her right hand palm when she attempted to transfer herself from her wheelchair to the sofa prior to arrival.  She describes tingling sensation down her right lateral arm and a pins-and-needles sensation in the palm of her right hand since this event occurred.  She has pain with attempts at rotating her head to the right, no problems with leftward rotation.  She denies prior history of neck pain or injury but does have significant deformity in her lower back due to her spina bifida.  She has had no medications for this pain prior to arrival.     The history is provided by the patient.    Past Medical History:  Diagnosis Date  . Anemia associated with chronic renal failure   . Blood transfusion   . 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  . Depression 05/14/2015  . Dialysis care   . ESRD (end stage renal disease) on dialysis ([place])   . GERD (gastroesophageal reflux disease) 01/07/2017  . HTN (hypertension) 05/02/2011  . Spina bifida   . UTI (lower urinary tract infection)     Patient Active Problem List   Diagnosis Date Noted  . Acute UTI 05/07/2018  . Tobacco use 05/07/2018  . Hypokalemia 05/07/2018  . GERD (gastroesophageal reflux disease) 01/07/2017  . Nausea with vomiting 01/07/2017  . Pyelonephritis 07/31/2015  . Back pain 07/31/2015  . Abdominal pain 07/31/2015  . Constipation 07/31/2015  . Major depressive disorder, single episode, severe without psychotic features  ([place]) 05/15/2015  . Spina bifida ([place]) 01/18/2012  . ESRD (end stage renal disease) on dialysis ([place]) 05/02/2011  . Anemia 05/02/2011  . HTN (hypertension) 05/02/2011    Past Surgical History:  Procedure Laterality Date  . AV FISTULA PLACEMENT     left arm     OB History    Gravida  0   Para  0   Term  0   Preterm  0   AB  0   Living  0     SAB  0   TAB  0   Ectopic  0   Multiple  0   Live Births  0            Home Medications    Prior to Admission medications   Medication Sig Start Date End Date Taking? Authorizing Provider  AURYXIA 1 GM 210 MG(Fe) tablet Take 2 tablets by mouth 3 (three) times daily with meals. 04/15/18   [provider]  cyclobenzaprine (FLEXERIL) 5 MG tablet Take 1 tablet (5 mg total) by mouth 2 (two) times daily. 05/25/19   Evalee Jefferson, PA-C  diphenhydrAMINE (BENADRYL) 25 mg capsule Take 25 mg by mouth daily as needed for allergies.    [provider]  Epoetin Alfa (EPOGEN IJ) Inject as directed See admin instructions. Every Tuesday, Thursday, Saturday    [provider]  IRON DEXTRAN IJ Inject as directed once.    [provider]  multivitamin (RENA-VIT) TABS tablet  Take 1 tablet by mouth See admin instructions. Every Tuesday, Thursday, Saturday    [provider]  omeprazole (PRILOSEC) 20 MG capsule Take 20 mg by mouth 2 (two) times daily before a meal.  03/26/16   [provider]  ondansetron (ZOFRAN) 4 MG tablet Take 4 mg by mouth every 8 (eight) hours as needed for nausea or vomiting.  01/15/19   [provider]  sulfamethoxazole-trimethoprim (BACTRIM) 400-80 MG tablet Take 1 tablet by mouth 2 (two) times daily. 03/15/19   Lily Kocher, PA-C  traMADol (ULTRAM) 50 MG tablet Take 1 tablet (50 mg total) by mouth 3 (three) times daily as needed. 03/15/19   Lily Kocher, PA-C    Family History Family History  Problem Relation Age of Onset  . Kidney cancer Other   .  Hypertension Maternal Grandmother   . Arthritis Maternal Grandmother   . Breast cancer Maternal Aunt   . Colon cancer Neg Hx     Social History Social History   Tobacco Use  . Smoking status: Former Smoker    Packs/day: 0.10    Types: Cigarettes  . Smokeless tobacco: Never Used  . Tobacco comment: 2 cigs a day  Substance Use Topics  . Alcohol use: No  . Drug use: No     Allergies   Ciprofloxacin; Other; Peanut-containing drug products; Aleve [naproxen sodium]; Ceftriaxone; Coconut oil; Influenza vaccines; Tetanus toxoid, adsorbed; Tetanus toxoids; and Latex   Review of Systems Review of Systems  Constitutional: Negative for fever.  Musculoskeletal: Positive for arthralgias. Negative for joint swelling and myalgias.  Neurological: Negative for weakness and numbness.     Physical Exam Updated Vital Signs BP (!) 152/101 (BP Location: Right Arm)   Pulse (!) 117   Temp 98.4 F (36.9 C) (Oral)   Resp 15   Wt 27.2 kg   SpO2 100%   BMI 73.24 kg/m   Physical Exam Constitutional:      Appearance: She is well-developed.  HENT:     Head: Atraumatic.  Neck:     Musculoskeletal: Normal range of motion.  Cardiovascular:     Comments: Pulses equal bilaterally Musculoskeletal:        General: Tenderness present.     Cervical back: She exhibits bony tenderness.       Back:     Comments: Tender to palpation midline cervical spine.  Pronounced kyphosis of lower back.  Lower extremities absent. c spine right rotation reduced, no pain with leftward rotation.  Skin:    General: Skin is warm and dry.  Neurological:     Mental Status: She is alert.     Sensory: Sensation is intact. No sensory deficit.     Motor: Motor function is intact.     Deep Tendon Reflexes: Reflexes normal.     Reflex Scores:      Bicep reflexes are 2+ on the right side and 2+ on the left side.    Comments: Grip strength 5/5 right, 5/5 left.      ED Treatments / Results  Labs (all labs ordered  are listed, but only abnormal results are displayed) Labs Reviewed - No data to display  EKG None  Radiology Dg Cervical Spine Complete  Result Date: 05/25/2019 CLINICAL DATA:  Cervical radicular pain EXAM: CERVICAL SPINE - COMPLETE 4+ VIEW COMPARISON:  None. FINDINGS: Klippel-Feil morphology of C2-3. Incomplete fusion of the posterior C1 ring. Visibility of the lower cervical spine on lateral projections is limited. No visible fracture. Alignment is normal.  No prevertebral soft tissue swelling. Poor visualization of the odontoid process. IMPRESSION: 1. Limited visualization of the odontoid process and the lower cervical spine due to positioning. 2. 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 Electronically Signed   By: Ulyses Jarred M.D.   On: 05/25/2019 01:41    Procedures Procedures (including critical care time)  Medications Ordered in ED Medications  cyclobenzaprine (FLEXERIL) tablet 10 mg (has no administration in time range)     Initial Impression / Assessment and Plan / ED Course  I have reviewed the triage vital signs and the nursing notes.  Pertinent labs & imaging results that were available during my care of the patient were reviewed by me and considered in my medical decision making (see chart for details).        Pt with sudden onset right upper back/neck pain with radiculopathy. Exam and hx suggesting radiculopathy. No upper extremity weakness on exam.  Suspect torticollis, but with sig radiculopathy.  Prescribed flexeril, discussed ice and heat tx.  Plan f/u with pcp if sx persist, worsen or not resolved over the next week.    Final Clinical Impressions(s) / ED Diagnoses   Final diagnoses:  Cervical radiculopathy    ED Discharge Orders         Ordered    cyclobenzaprine (FLEXERIL) 5 MG tablet  2 times daily     05/25/19 0126           Evalee Jefferson, PA-C 05/25/19 0206    Merryl Hacker,  MD 05/25/19 [phone]

## 2019-05-25 NOTE — ED Notes (Signed)
Patient transported to x-ray. ?

## 2019-05-25 NOTE — Discharge Instructions (Addendum)
Apply ice packs to your neck and shoulder area is much as is comfortable to the next several days.  You may also use a heating pad for 20 minutes several times daily starting on Friday which will help to relax these muscles.  Use the Flexeril which is a muscle relaxer.  This medication can make you sleepy so use caution while taking this medicine.  See your doctor for recheck if your symptoms are not improving over the next week or if you develop any weakness or worsening numbness in your right arm or hand.

## 2019-05-26 DIAGNOSIS — N186 End stage renal disease: Secondary | ICD-10-CM | POA: Diagnosis not present

## 2019-05-26 DIAGNOSIS — Z992 Dependence on renal dialysis: Secondary | ICD-10-CM | POA: Diagnosis not present

## 2019-05-26 DIAGNOSIS — N2581 Secondary hyperparathyroidism of renal origin: Secondary | ICD-10-CM | POA: Diagnosis not present

## 2019-05-28 DIAGNOSIS — N186 End stage renal disease: Secondary | ICD-10-CM | POA: Diagnosis not present

## 2019-05-28 DIAGNOSIS — N2581 Secondary hyperparathyroidism of renal origin: Secondary | ICD-10-CM | POA: Diagnosis not present

## 2019-05-28 DIAGNOSIS — Z992 Dependence on renal dialysis: Secondary | ICD-10-CM | POA: Diagnosis not present

## 2019-05-29 DIAGNOSIS — I120 Hypertensive chronic kidney disease with stage 5 chronic kidney disease or end stage renal disease: Secondary | ICD-10-CM | POA: Diagnosis not present

## 2019-05-29 DIAGNOSIS — N186 End stage renal disease: Secondary | ICD-10-CM | POA: Diagnosis not present

## 2019-05-29 DIAGNOSIS — Z992 Dependence on renal dialysis: Secondary | ICD-10-CM | POA: Diagnosis not present

## 2019-05-31 DIAGNOSIS — D631 Anemia in chronic kidney disease: Secondary | ICD-10-CM | POA: Diagnosis not present

## 2019-05-31 DIAGNOSIS — Z992 Dependence on renal dialysis: Secondary | ICD-10-CM | POA: Diagnosis not present

## 2019-05-31 DIAGNOSIS — N2581 Secondary hyperparathyroidism of renal origin: Secondary | ICD-10-CM | POA: Diagnosis not present

## 2019-05-31 DIAGNOSIS — N186 End stage renal disease: Secondary | ICD-10-CM | POA: Diagnosis not present

## 2019-06-01 DIAGNOSIS — M62830 Muscle spasm of back: Secondary | ICD-10-CM | POA: Diagnosis not present

## 2019-06-02 DIAGNOSIS — N186 End stage renal disease: Secondary | ICD-10-CM | POA: Diagnosis not present

## 2019-06-02 DIAGNOSIS — N2581 Secondary hyperparathyroidism of renal origin: Secondary | ICD-10-CM | POA: Diagnosis not present

## 2019-06-02 DIAGNOSIS — Z992 Dependence on renal dialysis: Secondary | ICD-10-CM | POA: Diagnosis not present

## 2019-06-02 DIAGNOSIS — D631 Anemia in chronic kidney disease: Secondary | ICD-10-CM | POA: Diagnosis not present

## 2019-06-04 DIAGNOSIS — D631 Anemia in chronic kidney disease: Secondary | ICD-10-CM | POA: Diagnosis not present

## 2019-06-04 DIAGNOSIS — Z992 Dependence on renal dialysis: Secondary | ICD-10-CM | POA: Diagnosis not present

## 2019-06-04 DIAGNOSIS — N186 End stage renal disease: Secondary | ICD-10-CM | POA: Diagnosis not present

## 2019-06-04 DIAGNOSIS — N2581 Secondary hyperparathyroidism of renal origin: Secondary | ICD-10-CM | POA: Diagnosis not present

## 2019-06-07 DIAGNOSIS — Z992 Dependence on renal dialysis: Secondary | ICD-10-CM | POA: Diagnosis not present

## 2019-06-07 DIAGNOSIS — N2581 Secondary hyperparathyroidism of renal origin: Secondary | ICD-10-CM | POA: Diagnosis not present

## 2019-06-07 DIAGNOSIS — N186 End stage renal disease: Secondary | ICD-10-CM | POA: Diagnosis not present

## 2019-06-07 DIAGNOSIS — D631 Anemia in chronic kidney disease: Secondary | ICD-10-CM | POA: Diagnosis not present

## 2019-06-09 DIAGNOSIS — Z992 Dependence on renal dialysis: Secondary | ICD-10-CM | POA: Diagnosis not present

## 2019-06-09 DIAGNOSIS — D631 Anemia in chronic kidney disease: Secondary | ICD-10-CM | POA: Diagnosis not present

## 2019-06-09 DIAGNOSIS — N186 End stage renal disease: Secondary | ICD-10-CM | POA: Diagnosis not present

## 2019-06-09 DIAGNOSIS — N2581 Secondary hyperparathyroidism of renal origin: Secondary | ICD-10-CM | POA: Diagnosis not present

## 2019-06-11 DIAGNOSIS — N2581 Secondary hyperparathyroidism of renal origin: Secondary | ICD-10-CM | POA: Diagnosis not present

## 2019-06-11 DIAGNOSIS — Z992 Dependence on renal dialysis: Secondary | ICD-10-CM | POA: Diagnosis not present

## 2019-06-11 DIAGNOSIS — D631 Anemia in chronic kidney disease: Secondary | ICD-10-CM | POA: Diagnosis not present

## 2019-06-11 DIAGNOSIS — N186 End stage renal disease: Secondary | ICD-10-CM | POA: Diagnosis not present

## 2019-06-14 DIAGNOSIS — N186 End stage renal disease: Secondary | ICD-10-CM | POA: Diagnosis not present

## 2019-06-14 DIAGNOSIS — D631 Anemia in chronic kidney disease: Secondary | ICD-10-CM | POA: Diagnosis not present

## 2019-06-14 DIAGNOSIS — Z992 Dependence on renal dialysis: Secondary | ICD-10-CM | POA: Diagnosis not present

## 2019-06-14 DIAGNOSIS — N2581 Secondary hyperparathyroidism of renal origin: Secondary | ICD-10-CM | POA: Diagnosis not present

## 2019-06-16 DIAGNOSIS — Z992 Dependence on renal dialysis: Secondary | ICD-10-CM | POA: Diagnosis not present

## 2019-06-16 DIAGNOSIS — N2581 Secondary hyperparathyroidism of renal origin: Secondary | ICD-10-CM | POA: Diagnosis not present

## 2019-06-16 DIAGNOSIS — D631 Anemia in chronic kidney disease: Secondary | ICD-10-CM | POA: Diagnosis not present

## 2019-06-16 DIAGNOSIS — N186 End stage renal disease: Secondary | ICD-10-CM | POA: Diagnosis not present

## 2019-06-18 DIAGNOSIS — N2581 Secondary hyperparathyroidism of renal origin: Secondary | ICD-10-CM | POA: Diagnosis not present

## 2019-06-18 DIAGNOSIS — N186 End stage renal disease: Secondary | ICD-10-CM | POA: Diagnosis not present

## 2019-06-18 DIAGNOSIS — D631 Anemia in chronic kidney disease: Secondary | ICD-10-CM | POA: Diagnosis not present

## 2019-06-18 DIAGNOSIS — Z992 Dependence on renal dialysis: Secondary | ICD-10-CM | POA: Diagnosis not present

## 2019-06-20 DIAGNOSIS — N2581 Secondary hyperparathyroidism of renal origin: Secondary | ICD-10-CM | POA: Diagnosis not present

## 2019-06-20 DIAGNOSIS — N186 End stage renal disease: Secondary | ICD-10-CM | POA: Diagnosis not present

## 2019-06-20 DIAGNOSIS — Z992 Dependence on renal dialysis: Secondary | ICD-10-CM | POA: Diagnosis not present

## 2019-06-20 DIAGNOSIS — D631 Anemia in chronic kidney disease: Secondary | ICD-10-CM | POA: Diagnosis not present

## 2019-06-20 DIAGNOSIS — R300 Dysuria: Secondary | ICD-10-CM | POA: Diagnosis not present

## 2019-06-22 DIAGNOSIS — N2581 Secondary hyperparathyroidism of renal origin: Secondary | ICD-10-CM | POA: Diagnosis not present

## 2019-06-22 DIAGNOSIS — D631 Anemia in chronic kidney disease: Secondary | ICD-10-CM | POA: Diagnosis not present

## 2019-06-22 DIAGNOSIS — Z992 Dependence on renal dialysis: Secondary | ICD-10-CM | POA: Diagnosis not present

## 2019-06-22 DIAGNOSIS — N186 End stage renal disease: Secondary | ICD-10-CM | POA: Diagnosis not present

## 2019-06-25 DIAGNOSIS — D631 Anemia in chronic kidney disease: Secondary | ICD-10-CM | POA: Diagnosis not present

## 2019-06-25 DIAGNOSIS — N2581 Secondary hyperparathyroidism of renal origin: Secondary | ICD-10-CM | POA: Diagnosis not present

## 2019-06-25 DIAGNOSIS — N186 End stage renal disease: Secondary | ICD-10-CM | POA: Diagnosis not present

## 2019-06-25 DIAGNOSIS — Z992 Dependence on renal dialysis: Secondary | ICD-10-CM | POA: Diagnosis not present

## 2019-07-29 DIAGNOSIS — N186 End stage renal disease: Secondary | ICD-10-CM | POA: Diagnosis not present

## 2019-07-29 DIAGNOSIS — Z992 Dependence on renal dialysis: Secondary | ICD-10-CM | POA: Diagnosis not present

## 2019-07-29 DIAGNOSIS — I120 Hypertensive chronic kidney disease with stage 5 chronic kidney disease or end stage renal disease: Secondary | ICD-10-CM | POA: Diagnosis not present

## 2019-08-02 DIAGNOSIS — Z992 Dependence on renal dialysis: Secondary | ICD-10-CM | POA: Diagnosis not present

## 2019-08-02 DIAGNOSIS — N186 End stage renal disease: Secondary | ICD-10-CM | POA: Diagnosis not present

## 2019-08-02 DIAGNOSIS — N390 Urinary tract infection, site not specified: Secondary | ICD-10-CM | POA: Diagnosis not present

## 2019-08-02 DIAGNOSIS — N2581 Secondary hyperparathyroidism of renal origin: Secondary | ICD-10-CM | POA: Diagnosis not present

## 2019-08-02 DIAGNOSIS — D631 Anemia in chronic kidney disease: Secondary | ICD-10-CM | POA: Diagnosis not present

## 2019-08-04 DIAGNOSIS — N390 Urinary tract infection, site not specified: Secondary | ICD-10-CM | POA: Diagnosis not present

## 2019-08-04 DIAGNOSIS — D631 Anemia in chronic kidney disease: Secondary | ICD-10-CM | POA: Diagnosis not present

## 2019-08-04 DIAGNOSIS — N2581 Secondary hyperparathyroidism of renal origin: Secondary | ICD-10-CM | POA: Diagnosis not present

## 2019-08-04 DIAGNOSIS — Z992 Dependence on renal dialysis: Secondary | ICD-10-CM | POA: Diagnosis not present

## 2019-08-04 DIAGNOSIS — N186 End stage renal disease: Secondary | ICD-10-CM | POA: Diagnosis not present

## 2019-08-06 DIAGNOSIS — D631 Anemia in chronic kidney disease: Secondary | ICD-10-CM | POA: Diagnosis not present

## 2019-08-06 DIAGNOSIS — Z992 Dependence on renal dialysis: Secondary | ICD-10-CM | POA: Diagnosis not present

## 2019-08-06 DIAGNOSIS — N186 End stage renal disease: Secondary | ICD-10-CM | POA: Diagnosis not present

## 2019-08-06 DIAGNOSIS — N2581 Secondary hyperparathyroidism of renal origin: Secondary | ICD-10-CM | POA: Diagnosis not present

## 2019-08-06 DIAGNOSIS — N390 Urinary tract infection, site not specified: Secondary | ICD-10-CM | POA: Diagnosis not present

## 2019-08-09 DIAGNOSIS — N2581 Secondary hyperparathyroidism of renal origin: Secondary | ICD-10-CM | POA: Diagnosis not present

## 2019-08-09 DIAGNOSIS — N390 Urinary tract infection, site not specified: Secondary | ICD-10-CM | POA: Diagnosis not present

## 2019-08-09 DIAGNOSIS — Z992 Dependence on renal dialysis: Secondary | ICD-10-CM | POA: Diagnosis not present

## 2019-08-09 DIAGNOSIS — N186 End stage renal disease: Secondary | ICD-10-CM | POA: Diagnosis not present

## 2019-08-09 DIAGNOSIS — D631 Anemia in chronic kidney disease: Secondary | ICD-10-CM | POA: Diagnosis not present

## 2019-08-11 DIAGNOSIS — R300 Dysuria: Secondary | ICD-10-CM | POA: Diagnosis not present

## 2019-08-11 DIAGNOSIS — Z992 Dependence on renal dialysis: Secondary | ICD-10-CM | POA: Diagnosis not present

## 2019-08-11 DIAGNOSIS — N186 End stage renal disease: Secondary | ICD-10-CM | POA: Diagnosis not present

## 2019-08-11 DIAGNOSIS — N390 Urinary tract infection, site not specified: Secondary | ICD-10-CM | POA: Diagnosis not present

## 2019-08-11 DIAGNOSIS — D631 Anemia in chronic kidney disease: Secondary | ICD-10-CM | POA: Diagnosis not present

## 2019-08-11 DIAGNOSIS — N2581 Secondary hyperparathyroidism of renal origin: Secondary | ICD-10-CM | POA: Diagnosis not present

## 2019-08-11 DIAGNOSIS — M545 Low back pain: Secondary | ICD-10-CM | POA: Diagnosis not present

## 2019-08-13 DIAGNOSIS — N2581 Secondary hyperparathyroidism of renal origin: Secondary | ICD-10-CM | POA: Diagnosis not present

## 2019-08-13 DIAGNOSIS — Z992 Dependence on renal dialysis: Secondary | ICD-10-CM | POA: Diagnosis not present

## 2019-08-13 DIAGNOSIS — N186 End stage renal disease: Secondary | ICD-10-CM | POA: Diagnosis not present

## 2019-08-13 DIAGNOSIS — D631 Anemia in chronic kidney disease: Secondary | ICD-10-CM | POA: Diagnosis not present

## 2019-08-13 DIAGNOSIS — N390 Urinary tract infection, site not specified: Secondary | ICD-10-CM | POA: Diagnosis not present

## 2019-08-16 DIAGNOSIS — N2581 Secondary hyperparathyroidism of renal origin: Secondary | ICD-10-CM | POA: Diagnosis not present

## 2019-08-16 DIAGNOSIS — N186 End stage renal disease: Secondary | ICD-10-CM | POA: Diagnosis not present

## 2019-08-16 DIAGNOSIS — N390 Urinary tract infection, site not specified: Secondary | ICD-10-CM | POA: Diagnosis not present

## 2019-08-16 DIAGNOSIS — D631 Anemia in chronic kidney disease: Secondary | ICD-10-CM | POA: Diagnosis not present

## 2019-08-16 DIAGNOSIS — Z992 Dependence on renal dialysis: Secondary | ICD-10-CM | POA: Diagnosis not present

## 2019-08-18 DIAGNOSIS — Z992 Dependence on renal dialysis: Secondary | ICD-10-CM | POA: Diagnosis not present

## 2019-08-18 DIAGNOSIS — N186 End stage renal disease: Secondary | ICD-10-CM | POA: Diagnosis not present

## 2019-08-18 DIAGNOSIS — E039 Hypothyroidism, unspecified: Secondary | ICD-10-CM | POA: Diagnosis not present

## 2019-08-18 DIAGNOSIS — D631 Anemia in chronic kidney disease: Secondary | ICD-10-CM | POA: Diagnosis not present

## 2019-08-18 DIAGNOSIS — N2581 Secondary hyperparathyroidism of renal origin: Secondary | ICD-10-CM | POA: Diagnosis not present

## 2019-08-18 DIAGNOSIS — N390 Urinary tract infection, site not specified: Secondary | ICD-10-CM | POA: Diagnosis not present

## 2019-08-20 DIAGNOSIS — N2581 Secondary hyperparathyroidism of renal origin: Secondary | ICD-10-CM | POA: Diagnosis not present

## 2019-08-20 DIAGNOSIS — N390 Urinary tract infection, site not specified: Secondary | ICD-10-CM | POA: Diagnosis not present

## 2019-08-20 DIAGNOSIS — N186 End stage renal disease: Secondary | ICD-10-CM | POA: Diagnosis not present

## 2019-08-20 DIAGNOSIS — D631 Anemia in chronic kidney disease: Secondary | ICD-10-CM | POA: Diagnosis not present

## 2019-08-20 DIAGNOSIS — Z992 Dependence on renal dialysis: Secondary | ICD-10-CM | POA: Diagnosis not present

## 2019-08-23 DIAGNOSIS — N2581 Secondary hyperparathyroidism of renal origin: Secondary | ICD-10-CM | POA: Diagnosis not present

## 2019-08-23 DIAGNOSIS — Z992 Dependence on renal dialysis: Secondary | ICD-10-CM | POA: Diagnosis not present

## 2019-08-23 DIAGNOSIS — N186 End stage renal disease: Secondary | ICD-10-CM | POA: Diagnosis not present

## 2019-08-23 DIAGNOSIS — D631 Anemia in chronic kidney disease: Secondary | ICD-10-CM | POA: Diagnosis not present

## 2019-08-23 DIAGNOSIS — N390 Urinary tract infection, site not specified: Secondary | ICD-10-CM | POA: Diagnosis not present

## 2019-08-27 DIAGNOSIS — D631 Anemia in chronic kidney disease: Secondary | ICD-10-CM | POA: Diagnosis not present

## 2019-08-27 DIAGNOSIS — N2581 Secondary hyperparathyroidism of renal origin: Secondary | ICD-10-CM | POA: Diagnosis not present

## 2019-08-27 DIAGNOSIS — N186 End stage renal disease: Secondary | ICD-10-CM | POA: Diagnosis not present

## 2019-08-27 DIAGNOSIS — Z992 Dependence on renal dialysis: Secondary | ICD-10-CM | POA: Diagnosis not present

## 2019-08-27 DIAGNOSIS — N390 Urinary tract infection, site not specified: Secondary | ICD-10-CM | POA: Diagnosis not present

## 2019-08-29 DIAGNOSIS — I120 Hypertensive chronic kidney disease with stage 5 chronic kidney disease or end stage renal disease: Secondary | ICD-10-CM | POA: Diagnosis not present

## 2019-08-29 DIAGNOSIS — N186 End stage renal disease: Secondary | ICD-10-CM | POA: Diagnosis not present

## 2019-08-29 DIAGNOSIS — Z992 Dependence on renal dialysis: Secondary | ICD-10-CM | POA: Diagnosis not present

## 2019-08-30 DIAGNOSIS — Z992 Dependence on renal dialysis: Secondary | ICD-10-CM | POA: Diagnosis not present

## 2019-08-30 DIAGNOSIS — D631 Anemia in chronic kidney disease: Secondary | ICD-10-CM | POA: Diagnosis not present

## 2019-08-30 DIAGNOSIS — E871 Hypo-osmolality and hyponatremia: Secondary | ICD-10-CM | POA: Diagnosis not present

## 2019-08-30 DIAGNOSIS — N2581 Secondary hyperparathyroidism of renal origin: Secondary | ICD-10-CM | POA: Diagnosis not present

## 2019-08-30 DIAGNOSIS — N186 End stage renal disease: Secondary | ICD-10-CM | POA: Diagnosis not present

## 2019-09-01 DIAGNOSIS — Z992 Dependence on renal dialysis: Secondary | ICD-10-CM | POA: Diagnosis not present

## 2019-09-01 DIAGNOSIS — D631 Anemia in chronic kidney disease: Secondary | ICD-10-CM | POA: Diagnosis not present

## 2019-09-01 DIAGNOSIS — N186 End stage renal disease: Secondary | ICD-10-CM | POA: Diagnosis not present

## 2019-09-01 DIAGNOSIS — N2581 Secondary hyperparathyroidism of renal origin: Secondary | ICD-10-CM | POA: Diagnosis not present

## 2019-09-01 DIAGNOSIS — E871 Hypo-osmolality and hyponatremia: Secondary | ICD-10-CM | POA: Diagnosis not present

## 2019-09-03 DIAGNOSIS — E871 Hypo-osmolality and hyponatremia: Secondary | ICD-10-CM | POA: Diagnosis not present

## 2019-09-03 DIAGNOSIS — D631 Anemia in chronic kidney disease: Secondary | ICD-10-CM | POA: Diagnosis not present

## 2019-09-03 DIAGNOSIS — N186 End stage renal disease: Secondary | ICD-10-CM | POA: Diagnosis not present

## 2019-09-03 DIAGNOSIS — Z992 Dependence on renal dialysis: Secondary | ICD-10-CM | POA: Diagnosis not present

## 2019-09-03 DIAGNOSIS — N2581 Secondary hyperparathyroidism of renal origin: Secondary | ICD-10-CM | POA: Diagnosis not present

## 2019-09-06 DIAGNOSIS — D631 Anemia in chronic kidney disease: Secondary | ICD-10-CM | POA: Diagnosis not present

## 2019-09-06 DIAGNOSIS — Z992 Dependence on renal dialysis: Secondary | ICD-10-CM | POA: Diagnosis not present

## 2019-09-06 DIAGNOSIS — N2581 Secondary hyperparathyroidism of renal origin: Secondary | ICD-10-CM | POA: Diagnosis not present

## 2019-09-06 DIAGNOSIS — N186 End stage renal disease: Secondary | ICD-10-CM | POA: Diagnosis not present

## 2019-09-06 DIAGNOSIS — E871 Hypo-osmolality and hyponatremia: Secondary | ICD-10-CM | POA: Diagnosis not present

## 2019-09-08 DIAGNOSIS — D631 Anemia in chronic kidney disease: Secondary | ICD-10-CM | POA: Diagnosis not present

## 2019-09-08 DIAGNOSIS — N2581 Secondary hyperparathyroidism of renal origin: Secondary | ICD-10-CM | POA: Diagnosis not present

## 2019-09-08 DIAGNOSIS — E871 Hypo-osmolality and hyponatremia: Secondary | ICD-10-CM | POA: Diagnosis not present

## 2019-09-08 DIAGNOSIS — N186 End stage renal disease: Secondary | ICD-10-CM | POA: Diagnosis not present

## 2019-09-08 DIAGNOSIS — Z992 Dependence on renal dialysis: Secondary | ICD-10-CM | POA: Diagnosis not present

## 2019-09-10 DIAGNOSIS — E871 Hypo-osmolality and hyponatremia: Secondary | ICD-10-CM | POA: Diagnosis not present

## 2019-09-10 DIAGNOSIS — D631 Anemia in chronic kidney disease: Secondary | ICD-10-CM | POA: Diagnosis not present

## 2019-09-10 DIAGNOSIS — N2581 Secondary hyperparathyroidism of renal origin: Secondary | ICD-10-CM | POA: Diagnosis not present

## 2019-09-10 DIAGNOSIS — N186 End stage renal disease: Secondary | ICD-10-CM | POA: Diagnosis not present

## 2019-09-10 DIAGNOSIS — Z992 Dependence on renal dialysis: Secondary | ICD-10-CM | POA: Diagnosis not present

## 2019-09-13 DIAGNOSIS — D631 Anemia in chronic kidney disease: Secondary | ICD-10-CM | POA: Diagnosis not present

## 2019-09-13 DIAGNOSIS — N186 End stage renal disease: Secondary | ICD-10-CM | POA: Diagnosis not present

## 2019-09-13 DIAGNOSIS — N2581 Secondary hyperparathyroidism of renal origin: Secondary | ICD-10-CM | POA: Diagnosis not present

## 2019-09-13 DIAGNOSIS — E871 Hypo-osmolality and hyponatremia: Secondary | ICD-10-CM | POA: Diagnosis not present

## 2019-09-13 DIAGNOSIS — Z992 Dependence on renal dialysis: Secondary | ICD-10-CM | POA: Diagnosis not present

## 2019-09-17 DIAGNOSIS — N2581 Secondary hyperparathyroidism of renal origin: Secondary | ICD-10-CM | POA: Diagnosis not present

## 2019-09-17 DIAGNOSIS — Z992 Dependence on renal dialysis: Secondary | ICD-10-CM | POA: Diagnosis not present

## 2019-09-17 DIAGNOSIS — D631 Anemia in chronic kidney disease: Secondary | ICD-10-CM | POA: Diagnosis not present

## 2019-09-17 DIAGNOSIS — E871 Hypo-osmolality and hyponatremia: Secondary | ICD-10-CM | POA: Diagnosis not present

## 2019-09-17 DIAGNOSIS — N186 End stage renal disease: Secondary | ICD-10-CM | POA: Diagnosis not present

## 2019-09-20 ENCOUNTER — Emergency Department (HOSPITAL_COMMUNITY): Payer: Medicare Other

## 2019-09-20 ENCOUNTER — Other Ambulatory Visit: Payer: Self-pay

## 2019-09-20 ENCOUNTER — Emergency Department (HOSPITAL_COMMUNITY)
Admission: EM | Admit: 2019-09-20 | Discharge: 2019-09-20 | Disposition: A | Discharge status: Home / Self Care | Payer: Medicare Other | Attending: Emergency Medicine | Admitting: Emergency Medicine

## 2019-09-20 ENCOUNTER — Encounter (HOSPITAL_COMMUNITY): Payer: Self-pay | Admitting: Emergency Medicine

## 2019-09-20 DIAGNOSIS — Z91010 Allergy to peanuts: Secondary | ICD-10-CM | POA: Insufficient documentation

## 2019-09-20 DIAGNOSIS — R0789 Other chest pain: Secondary | ICD-10-CM | POA: Diagnosis not present

## 2019-09-20 DIAGNOSIS — I120 Hypertensive chronic kidney disease with stage 5 chronic kidney disease or end stage renal disease: Secondary | ICD-10-CM | POA: Insufficient documentation

## 2019-09-20 DIAGNOSIS — Z91040 Latex allergy status: Secondary | ICD-10-CM | POA: Insufficient documentation

## 2019-09-20 DIAGNOSIS — R05 Cough: Secondary | ICD-10-CM | POA: Diagnosis not present

## 2019-09-20 DIAGNOSIS — I10 Essential (primary) hypertension: Secondary | ICD-10-CM | POA: Diagnosis not present

## 2019-09-20 DIAGNOSIS — R079 Chest pain, unspecified: Secondary | ICD-10-CM | POA: Diagnosis not present

## 2019-09-20 DIAGNOSIS — Z87891 Personal history of nicotine dependence: Secondary | ICD-10-CM | POA: Diagnosis not present

## 2019-09-20 DIAGNOSIS — N186 End stage renal disease: Secondary | ICD-10-CM | POA: Diagnosis not present

## 2019-09-20 DIAGNOSIS — T829XXA Unspecified complication of cardiac and vascular prosthetic device, implant and graft, initial encounter: Secondary | ICD-10-CM

## 2019-09-20 DIAGNOSIS — N3000 Acute cystitis without hematuria: Secondary | ICD-10-CM | POA: Diagnosis not present

## 2019-09-20 DIAGNOSIS — M79602 Pain in left arm: Secondary | ICD-10-CM | POA: Diagnosis not present

## 2019-09-20 DIAGNOSIS — M79622 Pain in left upper arm: Secondary | ICD-10-CM | POA: Diagnosis not present

## 2019-09-20 DIAGNOSIS — Z79899 Other long term (current) drug therapy: Secondary | ICD-10-CM | POA: Insufficient documentation

## 2019-09-20 DIAGNOSIS — R069 Unspecified abnormalities of breathing: Secondary | ICD-10-CM | POA: Diagnosis not present

## 2019-09-20 DIAGNOSIS — R52 Pain, unspecified: Secondary | ICD-10-CM

## 2019-09-20 DIAGNOSIS — J811 Chronic pulmonary edema: Secondary | ICD-10-CM | POA: Diagnosis not present

## 2019-09-20 DIAGNOSIS — Z992 Dependence on renal dialysis: Secondary | ICD-10-CM | POA: Insufficient documentation

## 2019-09-20 DIAGNOSIS — J90 Pleural effusion, not elsewhere classified: Secondary | ICD-10-CM | POA: Diagnosis not present

## 2019-09-20 LAB — BASIC METABOLIC PANEL
Anion gap: 13 (ref 5–15)
BUN: 57 mg/dL — ABNORMAL HIGH (ref 6–20)
CO2: 21 mmol/L — ABNORMAL LOW (ref 22–32)
Calcium: 9.2 mg/dL (ref 8.9–10.3)
Chloride: 108 mmol/L (ref 98–111)
Creatinine, Ser: 8.81 mg/dL — ABNORMAL HIGH (ref 0.44–1.00)
GFR calc Af Amer: 6 mL/min — ABNORMAL LOW (ref >60)
GFR calc non Af Amer: 6 mL/min — ABNORMAL LOW (ref >60)
Glucose, Bld: 84 mg/dL (ref 70–99)
Potassium: 4.5 mmol/L (ref 3.5–5.1)
Sodium: 142 mmol/L (ref 135–145)

## 2019-09-20 LAB — CBC
HCT: 33.7 % — ABNORMAL LOW (ref 36.0–46.0)
Hemoglobin: 9.8 g/dL — ABNORMAL LOW (ref 12.0–15.0)
MCH: 26.2 pg (ref 26.0–34.0)
MCHC: 29.1 g/dL — ABNORMAL LOW (ref 30.0–36.0)
MCV: 90.1 fL (ref 80.0–100.0)
Platelets: 310 10*3/uL (ref 150–400)
RBC: 3.74 MIL/uL — ABNORMAL LOW (ref 3.87–5.11)
RDW: 17.9 % — ABNORMAL HIGH (ref 11.5–15.5)
WBC: 3.6 10*3/uL — ABNORMAL LOW (ref 4.0–10.5)
nRBC: 0 % (ref 0.0–0.2)

## 2019-09-20 LAB — URINALYSIS, ROUTINE W REFLEX MICROSCOPIC
Bacteria, UA: NONE SEEN
Bilirubin Urine: NEGATIVE
Glucose, UA: NEGATIVE mg/dL
Ketones, ur: NEGATIVE mg/dL
Nitrite: POSITIVE — AB
Protein, ur: 100 mg/dL — AB
Specific Gravity, Urine: 1.009 (ref 1.005–1.030)
pH: 9 — ABNORMAL HIGH (ref 5.0–8.0)

## 2019-09-20 LAB — CBG MONITORING, ED: Glucose-Capillary: 74 mg/dL (ref 70–99)

## 2019-09-20 LAB — TROPONIN I (HIGH SENSITIVITY)
Troponin I (High Sensitivity): 7 ng/L (ref <18)
Troponin I (High Sensitivity): 6 ng/L (ref <18)

## 2019-09-20 LAB — HCG, QUANTITATIVE, PREGNANCY: hCG, Beta Chain, Quant, S: 1 m[IU]/mL (ref <5)

## 2019-09-20 MED ORDER — ONDANSETRON 4 MG PO TBDP
4.0000 mg | ORAL_TABLET | Freq: Once | ORAL | Status: AC
  Administered 2019-09-20: 4 mg via ORAL
  Filled 2019-09-20: qty 1

## 2019-09-20 MED ORDER — SULFAMETHOXAZOLE-TRIMETHOPRIM 800-160 MG PO TABS
1.0000 | ORAL_TABLET | Freq: Once | ORAL | Status: AC
  Administered 2019-09-20: 1 via ORAL
  Filled 2019-09-20: qty 1

## 2019-09-20 MED ORDER — SULFAMETHOXAZOLE-TRIMETHOPRIM 800-160 MG PO TABS: 1.0000 | ORAL_TABLET | Freq: Every day | ORAL | 0 refills | Status: AC

## 2019-09-20 MED ORDER — IOHEXOL 300 MG/ML  SOLN
75.0000 mL | Freq: Once | INTRAMUSCULAR | Status: AC | PRN
  Administered 2019-09-20: 75 mL via INTRAVENOUS

## 2019-09-20 NOTE — ED Provider Notes (Signed)
[facility] Provider Note   CSN: [id number] Arrival date & time: 09/20/19  0725     History Chief Complaint  Patient presents with  . Chest Pain    [name] is a 29 y.o. female with a history of end-stage renal disease, last dialysis completed 3 days ago, anemia, GERD, hypertension and spina bifida who is wheelchair-bound, presenting with a 1 week history of worsening pain which starts at her graft site in her left antecubital space and is radiated up through her shoulder and chest.   She states the graft in her left shoulder and neck has become significantly more prominent in the past week.  She has constant now worsening pain described as aching and sharp, worse with palpation, movement, and is unable to lay on her left side secondary to pain.  She also endorses shortness of breath.  She has had no fevers or chills, mild cough which is been nonproductive.  She also endorses upper abdominal pain without nausea or vomiting.  She has noted episodes of swelling and numbness into her left forearm and hand.  She has found no alleviators.  She fully completed her dialysis 3 days ago without complication and is scheduled for dialysis today.  Of note, she also notes her urine has been stronger smelling and cloudy with concerns for possible uti.   The history is provided by the patient.  Chest Pain Associated symptoms: no abdominal pain, no dizziness, no fever, no headache, no nausea, no numbness, no shortness of breath, no vomiting and no weakness        Past Medical History:  Diagnosis Date  . Anemia associated with chronic renal failure   . Blood transfusion   . 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  . Depression 05/14/2015  . Dialysis care   . ESRD (end stage renal disease) on dialysis ([place])   . GERD (gastroesophageal reflux disease) 01/07/2017  . HTN (hypertension) 05/02/2011  . Spina bifida   . UTI (lower urinary tract infection)      Patient Active Problem List   Diagnosis Date Noted  . Acute UTI 05/07/2018  . Tobacco use 05/07/2018  . Hypokalemia 05/07/2018  . GERD (gastroesophageal reflux disease) 01/07/2017  . Nausea with vomiting 01/07/2017  . Pyelonephritis 07/31/2015  . Back pain 07/31/2015  . Abdominal pain 07/31/2015  . Constipation 07/31/2015  . Major depressive disorder, single episode, severe without psychotic features ([place]) 05/15/2015  . Spina bifida ([place]) 01/18/2012  . ESRD (end stage renal disease) on dialysis ([place]) 05/02/2011  . Anemia 05/02/2011  . HTN (hypertension) 05/02/2011    Past Surgical History:  Procedure Laterality Date  . AV FISTULA PLACEMENT     left arm     OB History    Gravida  0   Para  0   Term  0   Preterm  0   AB  0   Living  0     SAB  0   TAB  0   Ectopic  0   Multiple  0   Live Births  0           Family History  Problem Relation Age of Onset  . Kidney cancer Other   . Hypertension Maternal Grandmother   . Arthritis Maternal Grandmother   . Breast cancer Maternal Aunt   . Colon cancer Neg Hx     Social History   Tobacco Use  . Smoking status: Former Smoker  Packs/day: 0.10    Types: Cigarettes  . Smokeless tobacco: Never Used  . Tobacco comment: 2 cigs a day  Substance Use Topics  . Alcohol use: No  . Drug use: No    Home Medications Prior to Admission medications   Medication Sig Start Date End Date Taking? Authorizing Provider  acetaminophen (TYLENOL) 500 MG tablet Take 1,000 mg by mouth every 6 (six) hours as needed for mild pain or headache.   Yes [provider]  AURYXIA 1 GM 210 MG(Fe) tablet Take 2 tablets by mouth 3 (three) times daily with meals. 04/15/18  Yes [provider]  diphenhydrAMINE (BENADRYL) 25 mg capsule Take 25 mg by mouth daily as needed for allergies.   Yes [provider]  Epoetin Alfa (EPOGEN IJ) Inject as directed See admin instructions. Every Tuesday, Thursday,  Saturday   Yes [provider]  multivitamin (RENA-VIT) TABS tablet Take 1 tablet by mouth See admin instructions. Every Tuesday, Thursday, Saturday   Yes [provider]  omeprazole (PRILOSEC) 20 MG capsule Take 20 mg by mouth daily.  03/26/16  Yes [provider]  ondansetron (ZOFRAN) 4 MG tablet Take 4 mg by mouth every 8 (eight) hours as needed for nausea or vomiting.  01/15/19  Yes [provider]  traMADol (ULTRAM) 50 MG tablet Take 1 tablet (50 mg total) by mouth 3 (three) times daily as needed. 03/15/19  Yes Lily Kocher, PA-C  sulfamethoxazole-trimethoprim (BACTRIM DS) 800-160 MG tablet Take 1 tablet by mouth daily for 4 days. 09/20/19 09/24/19  Evalee Jefferson, PA-C    Allergies    Ciprofloxacin; Other; Peanut-containing drug products; Aleve [naproxen sodium]; Ceftriaxone; Coconut oil; Influenza vaccines; Tetanus toxoid, adsorbed; Tetanus toxoids; and Latex  Review of Systems   Review of Systems  Constitutional: Negative for chills and fever.  HENT: Negative for congestion and sore throat.   Eyes: Negative.   Respiratory: Negative for chest tightness and shortness of breath.   Cardiovascular: Positive for chest pain.  Gastrointestinal: Negative for abdominal pain, nausea and vomiting.  Genitourinary: Positive for dysuria.  Musculoskeletal: Positive for arthralgias. Negative for joint swelling and neck pain.  Skin: Negative.  Negative for rash and wound.  Neurological: Negative for dizziness, weakness, light-headedness, numbness and headaches.  Psychiatric/Behavioral: Negative.     Physical Exam Updated Vital Signs BP (!) 184/97   Pulse 84   Temp 98.4 F (36.9 C) (Oral)   Resp 16   Ht 2' (0.61 m)   Wt 28.6 kg   SpO2 100%   BMI 76.90 kg/m   Physical Exam Vitals and nursing note reviewed.  Constitutional:      Appearance: She is well-developed.  HENT:     Head: Normocephalic and atraumatic.  Eyes:     Conjunctiva/sclera: Conjunctivae  normal.  Neck:     Comments: Palpable graft through left upper arm and into the left lateral neck.  Tender and prominent, strong thrill present.  Cardiovascular:     Rate and Rhythm: Normal rate and regular rhythm.     Pulses:          Radial pulses are 2+ on the right side and 2+ on the left side.     Heart sounds: Normal heart sounds.  Pulmonary:     Effort: Pulmonary effort is normal.     Breath sounds: Normal breath sounds. No wheezing.  Abdominal:     General: Bowel sounds are normal.     Palpations: Abdomen is soft.  Tenderness: There is no abdominal tenderness.  Musculoskeletal:        General: Normal range of motion.  Skin:    General: Skin is warm and dry.  Neurological:     General: No focal deficit present.     Mental Status: She is alert.     ED Results / Procedures / Treatments   Labs (all labs ordered are listed, but only abnormal results are displayed) Labs Reviewed  BASIC METABOLIC PANEL - Abnormal; Notable for the following components:      Result Value   CO2 21 (*)    BUN 57 (*)    Creatinine, Ser 8.81 (*)    GFR calc non Af Amer 6 (*)    GFR calc Af Amer 6 (*)    All other components within normal limits  CBC - Abnormal; Notable for the following components:   WBC 3.6 (*)    RBC 3.74 (*)    Hemoglobin 9.8 (*)    HCT 33.7 (*)    MCHC 29.1 (*)    RDW 17.9 (*)    All other components within normal limits  URINALYSIS, ROUTINE W REFLEX MICROSCOPIC - Abnormal; Notable for the following components:   APPearance CLOUDY (*)    pH 9.0 (*)    Hgb urine dipstick SMALL (*)    Protein, ur 100 (*)    Nitrite POSITIVE (*)    Leukocytes,Ua MODERATE (*)    All other components within normal limits  URINE CULTURE  HCG, QUANTITATIVE, PREGNANCY  CBG MONITORING, ED  TROPONIN I (HIGH SENSITIVITY)  TROPONIN I (HIGH SENSITIVITY)    EKG EKG Interpretation  Date/Time:  Tuesday September 20 2019 07:39:21 EST Ventricular Rate:  81 PR Interval:    QRS  Duration: 78 QT Interval:  400 QTC Calculation: 465 R Axis:   43 Text Interpretation: Sinus rhythm Short PR interval No significant change was found Confirmed by Ezequiel Essex [phone]) on 09/20/2019 7:57:10 AM   Radiology CT Chest W Contrast  Result Date: 09/20/2019 CLINICAL DATA:  Evaluate for thoracic outlet obstruction. EXAM: CT CHEST WITH CONTRAST TECHNIQUE: Multidetector CT imaging of the chest was performed during intravenous contrast administration. CONTRAST:  47mL OMNIPAQUE IOHEXOL 300 MG/ML  SOLN COMPARISON:  01/22/2011 FINDINGS: Cardiovascular: Heart size appears normal. There is no pericardial effusion. Thoracic aorta appears intact. Branch vessels off the aortic arch are patent. The superior vena cava is widely patent. The left innominate vein, left subclavian vein and left axillary vein appear patent. The right subclavian vein and right axillary vein also appear patent. Mediastinum/Nodes: No enlarged mediastinal, hilar, or axillary lymph nodes. Thyroid gland, trachea, and esophagus demonstrate no significant findings. Lungs/Pleura: Small bilateral pleural effusions are identified. Bilateral lower lung zone predominant interstitial thickening and ground-glass attenuation identified compatible with pulmonary edema. Upper Abdomen: No acute abnormality. Limited imaging shows horseshoe kidney with severe hydronephrosis. Partially visualized fat density mass within the left abdomen is noted presumably representing a dermoid. Musculoskeletal: The bones appear diffusely sclerotic compatible with renal osteodystrophy. IMPRESSION: 1. No findings to suggest thoracic outlet obstruction. 2. Bilateral pleural effusions and pulmonary edema compatible with fluid overload state. 3. Horseshoe kidney with chronic hydronephrosis. 4. Left ovarian dermoid. Electronically Signed   By: Kerby Moors M.D.   On: 09/20/2019 11:06   [place] Dialysis Access  Result Date: 09/20/2019 CLINICAL DATA:  Left arm pain.  End-stage renal disease with left brachiocephalic fistula. EXAM: ULTRASOUND DIALYSIS ACCESS COMPARISON:  None. FINDINGS: There is a patent left brachiocephalic fistula.  Upper Arm Brachial Artery: Mild nonocclusive atheromatous plaque Size 5.15mm;   432 cm/sec Brachiocephalic anastomosis: 4.3 mm; 452 cm/sec Outflow cephalic vein: Fusiform aneurysmal dilatation Proximal 17.2 mm; 237 cm/sec Mid 12.6 mm; 181 cm/sec Distal 7.6 mm; 110 cm/sec Calculated flow 3865 mL/min IMPRESSION: 1. Patent left brachiocephalic fistula without apparent complication. Electronically Signed   By: Lucrezia Europe M.D.   On: 09/20/2019 13:43   US Venous Img Upper Uni Left  Result Date: 09/20/2019 CLINICAL DATA:  Arm and chest pain x1 week. End-stage renal disease on hemodialysis with left arm fistula. EXAM: LEFT UPPER EXTREMITY VENOUS DOPPLER ULTRASOUND TECHNIQUE: Gray-scale sonography with graded compression, as well as color Doppler and duplex ultrasound were performed to evaluate the upper extremity deep venous system from the level of the subclavian vein and including the jugular, axillary, basilic, radial, ulnar and upper cephalic vein. Spectral Doppler was utilized to evaluate flow at rest and with distal augmentation maneuvers. COMPARISON:  CT from earlier the same day FINDINGS: Contralateral Subclavian Vein: Respiratory phasicity is normal and symmetric with the symptomatic side. No evidence of thrombus. Normal compressibility. Internal Jugular Vein: No evidence of thrombus. Normal compressibility, respiratory phasicity and response to augmentation. Subclavian Vein: No evidence of thrombus. Normal compressibility, respiratory phasicity and response to augmentation. Axillary Vein: No evidence of thrombus. Normal compressibility, respiratory phasicity and response to augmentation. Cephalic Vein: Dilated secondary to brachiocephalic fistula, with antegrade flow signal. Basilic Vein: No evidence of thrombus. Normal compressibility,  respiratory phasicity and response to augmentation. Brachial Veins: No evidence of thrombus. Normal compressibility, respiratory phasicity and response to augmentation. Radial Veins: No evidence of thrombus. Normal compressibility, respiratory phasicity and response to augmentation. Ulnar Veins: No evidence of thrombus. Normal compressibility, respiratory phasicity and response to augmentation. Venous Reflux:  None visualized. Other Findings:  None visualized. IMPRESSION: No evidence of DVT within the left upper extremity. Electronically Signed   By: Lucrezia Europe M.D.   On: 09/20/2019 13:30   DG Chest Portable 1 View  Result Date: 09/20/2019 CLINICAL DATA:  Chest pain EXAM: PORTABLE CHEST 1 VIEW COMPARISON:  09/12/2018 FINDINGS: The heart size and mediastinal contours are within normal limits. Diffuse bilateral interstitial and heterogeneous airspace opacity. The visualized skeletal structures are unremarkable. IMPRESSION: Diffuse bilateral interstitial and heterogeneous airspace opacity, concerning for edema or infection. Electronically Signed   By: Eddie Candle M.D.   On: 09/20/2019 08:42    Procedures Procedures (including critical care time)  Medications Ordered in ED Medications  sulfamethoxazole-trimethoprim (BACTRIM DS) 800-160 MG per tablet 1 tablet (has no administration in time range)  iohexol (OMNIPAQUE) 300 MG/ML solution 75 mL (75 mLs Intravenous Contrast Given 09/20/19 1038)    ED Course  I have reviewed the triage vital signs and the nursing notes.  Pertinent labs & imaging results that were available during my care of the patient were reviewed by me and considered in my medical decision making (see chart for details).    MDM Rules/Calculators/A&P                      Labs and imaging reviewed and discussed with patient.  There is no obvious injury or complication with her graft currently.  It is patent.  CT imaging negative for thoracic outlet syndrome.  She does have a UTI, she  was started on Bactrim, dialysis dosing.  Discussed patient's case with Dr. Donzetta Matters of vascular surgery who confirmed that she can continue using the graft but would be happy to follow  her up as an outpatient for further evaluation of her symptoms.  Patient was given this information and anticipates follow-up within the next 1 to 2 weeks.   Final Clinical Impression(s) / ED Diagnoses Final diagnoses:  Pain  Dialysis complication, initial encounter  Acute cystitis without hematuria    Rx / DC Orders ED Discharge Orders         Ordered    sulfamethoxazole-trimethoprim (BACTRIM DS) 800-160 MG tablet  Daily     09/20/19 1621           Evalee Jefferson, PA-C 09/20/19 1639    Ezequiel Essex, MD 09/20/19 1902

## 2019-09-20 NOTE — Discharge Instructions (Addendum)
As discussed, your lab tests today are ok.  You do have a urinary infection and have been prescribed antibiotics.  You have received todays dose here.  Take your next doses after dialysis (on your dialysis days).  Call Dr Claretha Cooper office as discussed if you have not heard from them for an appointment time by Thursday.  He would like to see you within the next 1-2 week.  In the interim, it is ok to use your graft for dialysis.

## 2019-09-20 NOTE — ED Triage Notes (Addendum)
Patient brought in by EMS with complaint of chest pain x 1 week. States last dialysis was Saturday. Per EMS, patient's B/P 237/99.

## 2019-09-20 NOTE — ED Notes (Signed)
Two unsuccessful attempts for IV.  Lab Tech in to draw labs.

## 2019-09-21 LAB — URINE CULTURE: Culture: NO GROWTH

## 2019-09-22 DIAGNOSIS — N2581 Secondary hyperparathyroidism of renal origin: Secondary | ICD-10-CM | POA: Diagnosis not present

## 2019-09-22 DIAGNOSIS — E871 Hypo-osmolality and hyponatremia: Secondary | ICD-10-CM | POA: Diagnosis not present

## 2019-09-22 DIAGNOSIS — N186 End stage renal disease: Secondary | ICD-10-CM | POA: Diagnosis not present

## 2019-09-22 DIAGNOSIS — Z992 Dependence on renal dialysis: Secondary | ICD-10-CM | POA: Diagnosis not present

## 2019-09-22 DIAGNOSIS — D631 Anemia in chronic kidney disease: Secondary | ICD-10-CM | POA: Diagnosis not present

## 2019-09-26 ENCOUNTER — Encounter: Payer: Self-pay | Admitting: Gastroenterology

## 2019-09-26 DIAGNOSIS — I120 Hypertensive chronic kidney disease with stage 5 chronic kidney disease or end stage renal disease: Secondary | ICD-10-CM | POA: Diagnosis not present

## 2019-09-26 DIAGNOSIS — N186 End stage renal disease: Secondary | ICD-10-CM | POA: Diagnosis not present

## 2019-09-26 DIAGNOSIS — Z992 Dependence on renal dialysis: Secondary | ICD-10-CM | POA: Diagnosis not present

## 2019-09-27 DIAGNOSIS — Z992 Dependence on renal dialysis: Secondary | ICD-10-CM | POA: Diagnosis not present

## 2019-09-27 DIAGNOSIS — D631 Anemia in chronic kidney disease: Secondary | ICD-10-CM | POA: Diagnosis not present

## 2019-09-27 DIAGNOSIS — E871 Hypo-osmolality and hyponatremia: Secondary | ICD-10-CM | POA: Diagnosis not present

## 2019-09-27 DIAGNOSIS — N2581 Secondary hyperparathyroidism of renal origin: Secondary | ICD-10-CM | POA: Diagnosis not present

## 2019-09-27 DIAGNOSIS — N186 End stage renal disease: Secondary | ICD-10-CM | POA: Diagnosis not present

## 2019-09-28 DIAGNOSIS — N2581 Secondary hyperparathyroidism of renal origin: Secondary | ICD-10-CM | POA: Diagnosis not present

## 2019-09-28 DIAGNOSIS — N186 End stage renal disease: Secondary | ICD-10-CM | POA: Diagnosis not present

## 2019-09-28 DIAGNOSIS — D631 Anemia in chronic kidney disease: Secondary | ICD-10-CM | POA: Diagnosis not present

## 2019-09-28 DIAGNOSIS — Z992 Dependence on renal dialysis: Secondary | ICD-10-CM | POA: Diagnosis not present

## 2019-09-28 DIAGNOSIS — E871 Hypo-osmolality and hyponatremia: Secondary | ICD-10-CM | POA: Diagnosis not present

## 2019-10-01 DIAGNOSIS — E871 Hypo-osmolality and hyponatremia: Secondary | ICD-10-CM | POA: Diagnosis not present

## 2019-10-01 DIAGNOSIS — N2581 Secondary hyperparathyroidism of renal origin: Secondary | ICD-10-CM | POA: Diagnosis not present

## 2019-10-01 DIAGNOSIS — N186 End stage renal disease: Secondary | ICD-10-CM | POA: Diagnosis not present

## 2019-10-01 DIAGNOSIS — D631 Anemia in chronic kidney disease: Secondary | ICD-10-CM | POA: Diagnosis not present

## 2019-10-01 DIAGNOSIS — Z992 Dependence on renal dialysis: Secondary | ICD-10-CM | POA: Diagnosis not present

## 2019-10-04 DIAGNOSIS — Z992 Dependence on renal dialysis: Secondary | ICD-10-CM | POA: Diagnosis not present

## 2019-10-04 DIAGNOSIS — D631 Anemia in chronic kidney disease: Secondary | ICD-10-CM | POA: Diagnosis not present

## 2019-10-04 DIAGNOSIS — E871 Hypo-osmolality and hyponatremia: Secondary | ICD-10-CM | POA: Diagnosis not present

## 2019-10-04 DIAGNOSIS — N186 End stage renal disease: Secondary | ICD-10-CM | POA: Diagnosis not present

## 2019-10-04 DIAGNOSIS — N2581 Secondary hyperparathyroidism of renal origin: Secondary | ICD-10-CM | POA: Diagnosis not present

## 2019-10-06 DIAGNOSIS — Z992 Dependence on renal dialysis: Secondary | ICD-10-CM | POA: Diagnosis not present

## 2019-10-06 DIAGNOSIS — N2581 Secondary hyperparathyroidism of renal origin: Secondary | ICD-10-CM | POA: Diagnosis not present

## 2019-10-06 DIAGNOSIS — D631 Anemia in chronic kidney disease: Secondary | ICD-10-CM | POA: Diagnosis not present

## 2019-10-06 DIAGNOSIS — N186 End stage renal disease: Secondary | ICD-10-CM | POA: Diagnosis not present

## 2019-10-06 DIAGNOSIS — E871 Hypo-osmolality and hyponatremia: Secondary | ICD-10-CM | POA: Diagnosis not present

## 2019-10-08 DIAGNOSIS — Z992 Dependence on renal dialysis: Secondary | ICD-10-CM | POA: Diagnosis not present

## 2019-10-08 DIAGNOSIS — D631 Anemia in chronic kidney disease: Secondary | ICD-10-CM | POA: Diagnosis not present

## 2019-10-08 DIAGNOSIS — N2581 Secondary hyperparathyroidism of renal origin: Secondary | ICD-10-CM | POA: Diagnosis not present

## 2019-10-08 DIAGNOSIS — N186 End stage renal disease: Secondary | ICD-10-CM | POA: Diagnosis not present

## 2019-10-08 DIAGNOSIS — E871 Hypo-osmolality and hyponatremia: Secondary | ICD-10-CM | POA: Diagnosis not present

## 2019-10-11 DIAGNOSIS — N186 End stage renal disease: Secondary | ICD-10-CM | POA: Diagnosis not present

## 2019-10-11 DIAGNOSIS — D631 Anemia in chronic kidney disease: Secondary | ICD-10-CM | POA: Diagnosis not present

## 2019-10-11 DIAGNOSIS — Z992 Dependence on renal dialysis: Secondary | ICD-10-CM | POA: Diagnosis not present

## 2019-10-11 DIAGNOSIS — E871 Hypo-osmolality and hyponatremia: Secondary | ICD-10-CM | POA: Diagnosis not present

## 2019-10-11 DIAGNOSIS — N2581 Secondary hyperparathyroidism of renal origin: Secondary | ICD-10-CM | POA: Diagnosis not present

## 2019-10-13 DIAGNOSIS — N2581 Secondary hyperparathyroidism of renal origin: Secondary | ICD-10-CM | POA: Diagnosis not present

## 2019-10-13 DIAGNOSIS — N186 End stage renal disease: Secondary | ICD-10-CM | POA: Diagnosis not present

## 2019-10-13 DIAGNOSIS — Z992 Dependence on renal dialysis: Secondary | ICD-10-CM | POA: Diagnosis not present

## 2019-10-13 DIAGNOSIS — D631 Anemia in chronic kidney disease: Secondary | ICD-10-CM | POA: Diagnosis not present

## 2019-10-13 DIAGNOSIS — E871 Hypo-osmolality and hyponatremia: Secondary | ICD-10-CM | POA: Diagnosis not present

## 2019-10-14 ENCOUNTER — Ambulatory Visit: Payer: Medicare Other | Admitting: Vascular Surgery

## 2019-10-18 DIAGNOSIS — E871 Hypo-osmolality and hyponatremia: Secondary | ICD-10-CM | POA: Diagnosis not present

## 2019-10-18 DIAGNOSIS — Z992 Dependence on renal dialysis: Secondary | ICD-10-CM | POA: Diagnosis not present

## 2019-10-18 DIAGNOSIS — D631 Anemia in chronic kidney disease: Secondary | ICD-10-CM | POA: Diagnosis not present

## 2019-10-18 DIAGNOSIS — N186 End stage renal disease: Secondary | ICD-10-CM | POA: Diagnosis not present

## 2019-10-18 DIAGNOSIS — N2581 Secondary hyperparathyroidism of renal origin: Secondary | ICD-10-CM | POA: Diagnosis not present

## 2019-10-20 DIAGNOSIS — D631 Anemia in chronic kidney disease: Secondary | ICD-10-CM | POA: Diagnosis not present

## 2019-10-20 DIAGNOSIS — N186 End stage renal disease: Secondary | ICD-10-CM | POA: Diagnosis not present

## 2019-10-20 DIAGNOSIS — N2581 Secondary hyperparathyroidism of renal origin: Secondary | ICD-10-CM | POA: Diagnosis not present

## 2019-10-20 DIAGNOSIS — E871 Hypo-osmolality and hyponatremia: Secondary | ICD-10-CM | POA: Diagnosis not present

## 2019-10-20 DIAGNOSIS — Z992 Dependence on renal dialysis: Secondary | ICD-10-CM | POA: Diagnosis not present

## 2019-10-22 DIAGNOSIS — Z992 Dependence on renal dialysis: Secondary | ICD-10-CM | POA: Diagnosis not present

## 2019-10-22 DIAGNOSIS — N186 End stage renal disease: Secondary | ICD-10-CM | POA: Diagnosis not present

## 2019-10-22 DIAGNOSIS — D631 Anemia in chronic kidney disease: Secondary | ICD-10-CM | POA: Diagnosis not present

## 2019-10-22 DIAGNOSIS — N2581 Secondary hyperparathyroidism of renal origin: Secondary | ICD-10-CM | POA: Diagnosis not present

## 2019-10-22 DIAGNOSIS — E871 Hypo-osmolality and hyponatremia: Secondary | ICD-10-CM | POA: Diagnosis not present

## 2019-10-25 DIAGNOSIS — D631 Anemia in chronic kidney disease: Secondary | ICD-10-CM | POA: Diagnosis not present

## 2019-10-25 DIAGNOSIS — N186 End stage renal disease: Secondary | ICD-10-CM | POA: Diagnosis not present

## 2019-10-25 DIAGNOSIS — E871 Hypo-osmolality and hyponatremia: Secondary | ICD-10-CM | POA: Diagnosis not present

## 2019-10-25 DIAGNOSIS — N2581 Secondary hyperparathyroidism of renal origin: Secondary | ICD-10-CM | POA: Diagnosis not present

## 2019-10-25 DIAGNOSIS — Z992 Dependence on renal dialysis: Secondary | ICD-10-CM | POA: Diagnosis not present

## 2019-10-27 DIAGNOSIS — I120 Hypertensive chronic kidney disease with stage 5 chronic kidney disease or end stage renal disease: Secondary | ICD-10-CM | POA: Diagnosis not present

## 2019-10-27 DIAGNOSIS — E871 Hypo-osmolality and hyponatremia: Secondary | ICD-10-CM | POA: Diagnosis not present

## 2019-10-27 DIAGNOSIS — N2581 Secondary hyperparathyroidism of renal origin: Secondary | ICD-10-CM | POA: Diagnosis not present

## 2019-10-27 DIAGNOSIS — D509 Iron deficiency anemia, unspecified: Secondary | ICD-10-CM | POA: Diagnosis not present

## 2019-10-27 DIAGNOSIS — R300 Dysuria: Secondary | ICD-10-CM | POA: Diagnosis not present

## 2019-10-27 DIAGNOSIS — N186 End stage renal disease: Secondary | ICD-10-CM | POA: Diagnosis not present

## 2019-10-27 DIAGNOSIS — N390 Urinary tract infection, site not specified: Secondary | ICD-10-CM | POA: Diagnosis not present

## 2019-10-27 DIAGNOSIS — D631 Anemia in chronic kidney disease: Secondary | ICD-10-CM | POA: Diagnosis not present

## 2019-10-27 DIAGNOSIS — Z992 Dependence on renal dialysis: Secondary | ICD-10-CM | POA: Diagnosis not present

## 2019-10-29 DIAGNOSIS — Z992 Dependence on renal dialysis: Secondary | ICD-10-CM | POA: Diagnosis not present

## 2019-10-29 DIAGNOSIS — D509 Iron deficiency anemia, unspecified: Secondary | ICD-10-CM | POA: Diagnosis not present

## 2019-10-29 DIAGNOSIS — N186 End stage renal disease: Secondary | ICD-10-CM | POA: Diagnosis not present

## 2019-10-29 DIAGNOSIS — N390 Urinary tract infection, site not specified: Secondary | ICD-10-CM | POA: Diagnosis not present

## 2019-10-29 DIAGNOSIS — N2581 Secondary hyperparathyroidism of renal origin: Secondary | ICD-10-CM | POA: Diagnosis not present

## 2019-11-01 DIAGNOSIS — N2581 Secondary hyperparathyroidism of renal origin: Secondary | ICD-10-CM | POA: Diagnosis not present

## 2019-11-01 DIAGNOSIS — N390 Urinary tract infection, site not specified: Secondary | ICD-10-CM | POA: Diagnosis not present

## 2019-11-01 DIAGNOSIS — Z992 Dependence on renal dialysis: Secondary | ICD-10-CM | POA: Diagnosis not present

## 2019-11-01 DIAGNOSIS — D509 Iron deficiency anemia, unspecified: Secondary | ICD-10-CM | POA: Diagnosis not present

## 2019-11-01 DIAGNOSIS — N186 End stage renal disease: Secondary | ICD-10-CM | POA: Diagnosis not present

## 2019-11-02 ENCOUNTER — Encounter: Payer: Self-pay | Admitting: Gastroenterology

## 2019-11-02 ENCOUNTER — Ambulatory Visit (INDEPENDENT_AMBULATORY_CARE_PROVIDER_SITE_OTHER): Payer: Medicare Other | Admitting: Gastroenterology

## 2019-11-02 VITALS — BP 166/90 | HR 100 | Temp 97.8°F

## 2019-11-02 DIAGNOSIS — R1115 Cyclical vomiting syndrome unrelated to migraine: Secondary | ICD-10-CM | POA: Diagnosis not present

## 2019-11-02 DIAGNOSIS — K5909 Other constipation: Secondary | ICD-10-CM

## 2019-11-02 MED ORDER — METOCLOPRAMIDE HCL 5 MG PO TABS: 5.0000 mg | ORAL_TABLET | Freq: Four times a day (QID) | ORAL | 1 refills | Status: DC

## 2019-11-02 NOTE — Patient Instructions (Addendum)
If you are age 27 or older, your body mass index should be between 23-30. Your There is no height or weight on file to calculate BMI. If this is out of the aforementioned range listed, please consider follow up with your Primary Care Provider.  If you are age 76 or younger, your body mass index should be between 19-25. Your There is no height or weight on file to calculate BMI. If this is out of the aformentioned range listed, please consider follow up with your Primary Care Provider.   Please stop the Zofran and start the Reglan.  It was a pleasure to see you today!  Dr. Loletha Carrow

## 2019-11-02 NOTE — Progress Notes (Signed)
[facility] Consult Note:  History: [name] 11/02/2019  Referring provider: Katrine Coho, MD ([facility])  Reason for consult/chief complaint: Nausea (with vomiting after dialysis treatment)   Subjective  HPI: Seen by GI in [place] June 2018 for chronic constipation and nausea and vomiting occurring during and after dialysis.  Prescribed ondansetron and Linzess.  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  During her dialysis session she becomes nauseated and vomiting, usually subsiding after dialysis but sometimes occurring again later that same day.  It has behaved this way ever since she started dialysis.  Zofran 4 mg seem to work when she first started it, but lost its effect and she has not taken it for quite some time.  She also has chronic constipation that she attributes to her phosphate binding medication.  She is not able to take it at full dose because of the constipation.  She has taken some occasional MiraLAX if she does not have a BM for few days. Other antiemetics include Phenergan some years ago but she seems to recall that it made her feel worse. She denies dysphagia or odynophagia, hematemesis bright red blood per rectum or melena.  ROS:  Review of Systems  Constitutional: Negative for appetite change and unexpected weight change.  HENT: Negative for mouth sores and voice change.   Eyes: Negative for pain and redness.  Respiratory: Negative for cough and shortness of breath.   Cardiovascular: Positive for chest pain. Negative for palpitations.  Genitourinary: Negative for dysuria and hematuria.  Musculoskeletal: Negative for arthralgias and myalgias.  Skin: Negative for pallor and rash.  Neurological: Negative for weakness and headaches.  Hematological:  Negative for adenopathy.     Past Medical History: Past Medical History:  Diagnosis Date  . Anemia associated with chronic renal failure   . Blood transfusion   . 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  . Depression 05/14/2015  . Dialysis care   . ESRD (end stage renal disease) on dialysis ([place])   . GERD (gastroesophageal reflux disease) 01/07/2017  . HTN (hypertension) 05/02/2011  . Spina bifida   . UTI (lower urinary tract infection)      Past Surgical History: Past Surgical History:  Procedure Laterality Date  . AV FISTULA PLACEMENT     left arm     Family History: Family History  Problem Relation Age of Onset  . Kidney cancer Other   . Hypertension Maternal Grandmother   . Arthritis Maternal Grandmother   . Breast cancer Maternal Aunt   . Colon cancer Neg Hx     Social History: Social History   Socioeconomic History  . Marital status: Single    Spouse name: Not on file  . Number of children: Not on file  . Years of education: Not on file  . Highest education level: Not on file  Occupational History  . Not on file  Tobacco Use  . Smoking status: Former Smoker    Packs/day: 0.10    Types: Cigarettes  . Smokeless tobacco: Never Used  . Tobacco comment: 2 cigs a day  Substance and Sexual Activity  . Alcohol use: No  . Drug use: No  . Sexual activity: Never  Other Topics Concern  . Not on file  Social History Narrative  . Not on file  Social Determinants of Health   Financial Resource Strain:   . Difficulty of Paying Living Expenses:   Food Insecurity:   . Worried About Charity fundraiser in the Last Year:   . Arboriculturist in the Last Year:   Transportation Needs:   . Film/video editor (Medical):   Marland Kitchen Lack of Transportation (Non-Medical):   Physical Activity:   . Days of Exercise per Week:   . Minutes of Exercise per Session:   Stress:   . Feeling of Stress :   Social Connections:   . Frequency of Communication  with Friends and Family:   . Frequency of Social Gatherings with Friends and Family:   . Attends Religious Services:   . Active Member of Clubs or Organizations:   . Attends Archivist Meetings:   Marland Kitchen Marital Status:     Allergies: Allergies  Allergen Reactions  . Ciprofloxacin Shortness Of Breath, Nausea And Vomiting and Other (See Comments)    HIGH FEVER and oral blisters   . Other Anaphylaxis    Revaclear dialzer  . Peanut-Containing Drug Products Anaphylaxis  . Aleve [Naproxen Sodium] Other (See Comments)    G.I.Bleed  . Ceftriaxone Other (See Comments)    Blisters in mouth   . Coconut Oil Hives  . Influenza Vaccines Nausea And Vomiting and Other (See Comments)    High fever  . Tetanus Toxoid, Adsorbed Nausea And Vomiting and Other (See Comments)    HIGH FEVER, also  . Tetanus Toxoids Nausea And Vomiting and Other (See Comments)    HIGH FEVER  . Latex Itching and Rash    Outpatient Meds: Current Outpatient Medications  Medication Sig Dispense Refill  . acetaminophen (TYLENOL) 500 MG tablet Take 1,000 mg by mouth every 6 (six) hours as needed for mild pain or headache.    Lorin Picket 1 GM 210 MG(Fe) tablet Take 2 tablets by mouth 3 (three) times daily with meals.  3  . diphenhydrAMINE (BENADRYL) 25 mg capsule Take 25 mg by mouth daily as needed for allergies.    Marland Kitchen Epoetin Alfa (EPOGEN IJ) Inject as directed See admin instructions. Every Tuesday, Thursday, Saturday    . multivitamin (RENA-VIT) TABS tablet Take 1 tablet by mouth See admin instructions. Every Tuesday, Thursday, Saturday    . omeprazole (PRILOSEC) 20 MG capsule Take 20 mg by mouth daily.   3  . metoCLOPramide (REGLAN) 5 MG tablet Take 1 tablet (5 mg total) by mouth 4 (four) times daily. Take one tablet 1-2 hours before dialysis 45 tablet 1   No current facility-administered medications for this visit.      ___________________________________________________________________ Objective    Exam:  BP (!) 166/90   Pulse 100   Temp 97.8 F (36.6 C)    General: Not acutely ill-appearing, pleasant and conversational, moves herself about well in a wheelchair.  Eyes: sclera anicteric, no redness  ENT: oral mucosa moist without lesions, no cervical or supraclavicular lymphadenopathy  CV: RRR with systolic murmur, Y8/M5  Resp: clear to auscultation bilaterally, normal RR and effort noted  GI: soft, no tenderness, with active bowel sounds.  No bruit, limited exam in wheelchair  Skin; warm and dry, no rash or jaundice noted  Neuro: awake, alert and oriented x 3. Normal gross motor function and fluent speech Left upper arm AV fistula with palpable thrill  congenital formative lumbar spine, foreshortening of the lower extremities  Labs:  On January 21, WBC 8, hemoglobin 10.2, platelets 232 Alkaline  phosphatase 137, AST 13, creatinine 6.5  Last EKG on 09/21/2019: QTC 465 , no ischemic changes (ED visit for chest pain) __________________________________  CT abdomen September 2018 with obstipation and fecal impaction.  Assessment: Encounter Diagnoses  Name Primary?  . Non-intractable cyclical vomiting with nausea Yes  . Chronic constipation     Many years of chronic stable nausea and vomiting induced by dialysis.  She also gets headaches and chest pain, which are also common dialysis related symptoms.  Petra Kuba of these unknown, but suspected to be somehow related to alterations in vascular flow. Doubt obstructive cause, I think upper endoscopy would likely be of low yield.  Constipation, decreased motility, most likely neurologic contribution due to genital spine deformity, perhaps exacerbated by phosphate binder. Recommended more frequent use of the MiraLAX. Plan:  As ondansetron lost its effect, we could either go up on that dose or try a different agent. I have told her that options for medicines are limited.  We have agreed to trial of metoclopramide 5 mg  taken 1 to 2 hours before dialysis.  She could take an additional dose later that same day if needed. Cautioned about the possibility of neurologic side effects including tardive dyskinesia, and demonstrated what that might look like.  Instructed her to stop the medicine immediately, take a dose of Benadryl and call us if that should occur.  Follow-up with me in 2 months or sooner as needed.  Thank you for the courtesy of this consult.  Please call me with any questions or concerns.  Nelida Meuse III  CC: Referring provider noted above

## 2019-11-03 DIAGNOSIS — N390 Urinary tract infection, site not specified: Secondary | ICD-10-CM | POA: Diagnosis not present

## 2019-11-03 DIAGNOSIS — N2581 Secondary hyperparathyroidism of renal origin: Secondary | ICD-10-CM | POA: Diagnosis not present

## 2019-11-03 DIAGNOSIS — D509 Iron deficiency anemia, unspecified: Secondary | ICD-10-CM | POA: Diagnosis not present

## 2019-11-03 DIAGNOSIS — N186 End stage renal disease: Secondary | ICD-10-CM | POA: Diagnosis not present

## 2019-11-03 DIAGNOSIS — Z992 Dependence on renal dialysis: Secondary | ICD-10-CM | POA: Diagnosis not present

## 2019-11-05 ENCOUNTER — Encounter (HOSPITAL_COMMUNITY): Payer: Self-pay | Admitting: Emergency Medicine

## 2019-11-05 ENCOUNTER — Other Ambulatory Visit: Payer: Self-pay

## 2019-11-05 ENCOUNTER — Emergency Department (HOSPITAL_COMMUNITY)
Admission: EM | Admit: 2019-11-05 | Discharge: 2019-11-05 | Disposition: A | Discharge status: Home / Self Care | Payer: Medicare Other | Attending: Emergency Medicine | Admitting: Emergency Medicine

## 2019-11-05 DIAGNOSIS — Z992 Dependence on renal dialysis: Secondary | ICD-10-CM | POA: Insufficient documentation

## 2019-11-05 DIAGNOSIS — L02211 Cutaneous abscess of abdominal wall: Secondary | ICD-10-CM | POA: Diagnosis not present

## 2019-11-05 DIAGNOSIS — N186 End stage renal disease: Secondary | ICD-10-CM | POA: Insufficient documentation

## 2019-11-05 DIAGNOSIS — R1030 Lower abdominal pain, unspecified: Secondary | ICD-10-CM | POA: Diagnosis present

## 2019-11-05 DIAGNOSIS — I120 Hypertensive chronic kidney disease with stage 5 chronic kidney disease or end stage renal disease: Secondary | ICD-10-CM | POA: Insufficient documentation

## 2019-11-05 DIAGNOSIS — L0291 Cutaneous abscess, unspecified: Secondary | ICD-10-CM

## 2019-11-05 DIAGNOSIS — Z79899 Other long term (current) drug therapy: Secondary | ICD-10-CM | POA: Insufficient documentation

## 2019-11-05 DIAGNOSIS — Z87891 Personal history of nicotine dependence: Secondary | ICD-10-CM | POA: Insufficient documentation

## 2019-11-05 DIAGNOSIS — Z91040 Latex allergy status: Secondary | ICD-10-CM | POA: Diagnosis not present

## 2019-11-05 LAB — CBC
HCT: 31 % — ABNORMAL LOW (ref 36.0–46.0)
Hemoglobin: 9.1 g/dL — ABNORMAL LOW (ref 12.0–15.0)
MCH: 25.2 pg — ABNORMAL LOW (ref 26.0–34.0)
MCHC: 29.4 g/dL — ABNORMAL LOW (ref 30.0–36.0)
MCV: 85.9 fL (ref 80.0–100.0)
Platelets: 326 10*3/uL (ref 150–400)
RBC: 3.61 MIL/uL — ABNORMAL LOW (ref 3.87–5.11)
RDW: 18.1 % — ABNORMAL HIGH (ref 11.5–15.5)
WBC: 6.5 10*3/uL (ref 4.0–10.5)
nRBC: 0 % (ref 0.0–0.2)

## 2019-11-05 LAB — COMPREHENSIVE METABOLIC PANEL
ALT: 8 U/L (ref 0–44)
AST: 11 U/L — ABNORMAL LOW (ref 15–41)
Albumin: 2.9 g/dL — ABNORMAL LOW (ref 3.5–5.0)
Alkaline Phosphatase: 53 U/L (ref 38–126)
Anion gap: 15 (ref 5–15)
BUN: 54 mg/dL — ABNORMAL HIGH (ref 6–20)
CO2: 22 mmol/L (ref 22–32)
Calcium: 9.3 mg/dL (ref 8.9–10.3)
Chloride: 98 mmol/L (ref 98–111)
Creatinine, Ser: 10.55 mg/dL — ABNORMAL HIGH (ref 0.44–1.00)
GFR calc Af Amer: 5 mL/min — ABNORMAL LOW (ref >60)
GFR calc non Af Amer: 5 mL/min — ABNORMAL LOW (ref >60)
Glucose, Bld: 88 mg/dL (ref 70–99)
Potassium: 4.8 mmol/L (ref 3.5–5.1)
Sodium: 135 mmol/L (ref 135–145)
Total Bilirubin: 0.5 mg/dL (ref 0.3–1.2)
Total Protein: 7.6 g/dL (ref 6.5–8.1)

## 2019-11-05 LAB — LIPASE, BLOOD: Lipase: 35 U/L (ref 11–51)

## 2019-11-05 MED ORDER — HYDROCODONE-ACETAMINOPHEN 5-325 MG PO TABS
1.0000 | ORAL_TABLET | Freq: Once | ORAL | Status: AC
  Administered 2019-11-05: 1 via ORAL
  Filled 2019-11-05: qty 1

## 2019-11-05 MED ORDER — ACETAMINOPHEN 325 MG PO TABS
650.0000 mg | ORAL_TABLET | Freq: Once | ORAL | Status: AC | PRN
  Administered 2019-11-05: 650 mg via ORAL
  Filled 2019-11-05: qty 2

## 2019-11-05 MED ORDER — LIDOCAINE HCL (PF) 1 % IJ SOLN
10.0000 mL | Freq: Once | INTRAMUSCULAR | Status: DC
  Filled 2019-11-05: qty 30

## 2019-11-05 MED ORDER — POVIDONE-IODINE 10 % EX SOLN
CUTANEOUS | Status: AC
  Administered 2019-11-05: 1
  Filled 2019-11-05: qty 15

## 2019-11-05 MED ORDER — DOXYCYCLINE HYCLATE 100 MG PO CAPS: 100.0000 mg | ORAL_CAPSULE | Freq: Two times a day (BID) | ORAL | 0 refills | Status: AC

## 2019-11-05 NOTE — ED Triage Notes (Signed)
Pt complains of abd pain x 1 week. Patient has a noted abnormality to abdomen. She has tried heat, ice , and tylenol without relief. Patient is on dialysis TRS, but did not go today because she states she had a hard time on Thursday.

## 2019-11-05 NOTE — Discharge Instructions (Addendum)
As discussed, you have an abscess located on your abdomen which is causing your pain.  I am sending home with an antibiotic.  Take as prescribed.  Please follow-up with your PCP on Monday for a wound recheck.  Continue to place warm compresses on the area to allow extra drainage from the site.  You may take over-the-counter Tylenol as needed for pain.  Return to the ER for new or worsening symptoms.

## 2019-11-05 NOTE — ED Provider Notes (Signed)
[facility] EMERGENCY DEPARTMENT Provider Note   CSN: [id number] Arrival date & time: 11/05/19  1450     History Chief Complaint  Patient presents with  . Abdominal Pain    [name] is a 29 y.o. 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  Patient states abdominal pain is localized around a knot that has formed in her LLQ which has grown is size over the past few days.  Pain is worse with palpation and radiates down her left leg. Admits to nausea, but denies vomiting and diarrhea.  She has tried heat, ice, and Tylenol without relief.  Admits having a fever today.  Patient missed dialysis today because she was not feeling well, and did not complete a full dialysis on Thursday (finished 2.5 hours of 3) because she felt acutely ill with chills and elevated BP during dialysis.  Patient has a history of a past abscess on her left ear that required I&D.  Denies urinary and vaginal symptoms.   Patent has been on dialysis since the age of 33 and still produces some urine.    History obtained from patient and past medical records. No interpreter used during encounter.    Past Medical History:  Diagnosis Date  . Anemia associated with chronic renal failure   . Blood transfusion   . 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  . Depression 05/14/2015  . Dialysis care   . ESRD (end stage renal disease) on dialysis ([place])   . GERD (gastroesophageal reflux disease) 01/07/2017  . HTN (hypertension) 05/02/2011  . Spina bifida   . UTI (lower urinary tract infection)     Patient Active Problem List   Diagnosis Date Noted  . Acute UTI 05/07/2018  . Tobacco use 05/07/2018  . Hypokalemia 05/07/2018  . GERD (gastroesophageal reflux disease) 01/07/2017  . Nausea with vomiting  01/07/2017  . Pyelonephritis 07/31/2015  . Back pain 07/31/2015  . Abdominal pain 07/31/2015  . Constipation 07/31/2015  . Major depressive disorder, single episode, severe without psychotic features ([place]) 05/15/2015  . Spina bifida ([place]) 01/18/2012  . ESRD (end stage renal disease) on dialysis ([place]) 05/02/2011  . Anemia 05/02/2011  . HTN (hypertension) 05/02/2011    Past Surgical History:  Procedure Laterality Date  . AV FISTULA PLACEMENT     left arm     OB History    Gravida  0   Para  0   Term  0   Preterm  0   AB  0   Living  0     SAB  0   TAB  0   Ectopic  0   Multiple  0   Live Births  0           Family History  Problem Relation Age of Onset  . Kidney cancer Other   . Hypertension Maternal Grandmother   . Arthritis Maternal Grandmother   . Breast cancer Maternal Aunt   . Colon cancer Neg Hx     Social History   Tobacco Use  . Smoking status: Former Smoker    Packs/day: 0.10    Types: Cigarettes  . Smokeless tobacco: Never Used  . Tobacco comment: 2 cigs a day  Substance Use Topics  . Alcohol use: No  . Drug use: No  Home Medications Prior to Admission medications   Medication Sig Start Date End Date Taking? Authorizing Provider  acetaminophen (TYLENOL) 500 MG tablet Take 1,000 mg by mouth every 6 (six) hours as needed for mild pain or headache.    [provider]  amLODipine (NORVASC) 5 MG tablet Take 5 mg by mouth daily. 10/13/19   [provider]  AURYXIA 1 GM 210 MG(Fe) tablet Take 2 tablets by mouth 3 (three) times daily with meals. 04/15/18   [provider]  diphenhydrAMINE (BENADRYL) 25 mg capsule Take 25 mg by mouth daily as needed for allergies.    [provider]  doxycycline (VIBRAMYCIN) 100 MG capsule Take 1 capsule (100 mg total) by mouth 2 (two) times daily for 10 days. 11/05/19 11/15/19  Suzy Bouchard, PA-C  Epoetin Alfa (EPOGEN IJ) Inject as directed See admin instructions.  Every Tuesday, Thursday, Saturday    [provider]  metoCLOPramide (REGLAN) 5 MG tablet Take 1 tablet (5 mg total) by mouth 4 (four) times daily. Take one tablet 1-2 hours before dialysis 11/02/19   Doran Stabler, MD  multivitamin (RENA-VIT) TABS tablet Take 1 tablet by mouth See admin instructions. Every Tuesday, Thursday, Saturday    [provider]  omeprazole (PRILOSEC) 20 MG capsule Take 20 mg by mouth daily.  03/26/16   [provider]    Allergies    Ciprofloxacin; Other; Peanut-containing drug products; Aleve [naproxen sodium]; Ceftriaxone; Coconut oil; Influenza vaccines; Tetanus toxoid, adsorbed; Tetanus toxoids; and Latex  Review of Systems   Review of Systems  Constitutional: Positive for fever. Negative for chills.  Respiratory: Negative for shortness of breath.   Cardiovascular: Negative for chest pain.  Gastrointestinal: Positive for abdominal pain and nausea. Negative for diarrhea and vomiting.  Skin: Positive for color change.    Physical Exam Updated Vital Signs BP (!) 150/96 (BP Location: Right Arm)   Pulse (!) 110   Temp 99.8 F (37.7 C) (Oral)   Resp 17   Wt 28.6 kg   SpO2 100%   BMI 76.90 kg/m   Physical Exam Vitals and nursing note reviewed.  Constitutional:      General: She is not in acute distress.    Appearance: She is not toxic-appearing.  HENT:     Head: Normocephalic.  Eyes:     Pupils: Pupils are equal, round, and reactive to light.  Cardiovascular:     Rate and Rhythm: Normal rate and regular rhythm.     Pulses: Normal pulses.     Heart sounds: Normal heart sounds. No murmur. No friction rub. No gallop.   Pulmonary:     Effort: Pulmonary effort is normal.     Breath sounds: Normal breath sounds.  Abdominal:     General: Abdomen is flat. Bowel sounds are normal. There is no distension.     Palpations: Abdomen is soft.     Tenderness: There is abdominal tenderness. There is no guarding or rebound.      Comments: Tenderness surrounding abscess in LLQ  Musculoskeletal:     Cervical back: Neck supple.     Comments: Congenital deformity of lower extremities.   Skin:    Comments: 4x3cm area of induration with fluctuance in left lower quadrant with surrounding erythema. Graft in left upper extremity.   Neurological:     General: No focal deficit present.     Mental Status: She is alert.  Psychiatric:        Mood and Affect: Mood  normal.        Behavior: Behavior normal.     ED Results / Procedures / Treatments   Labs (all labs ordered are listed, but only abnormal results are displayed) Labs Reviewed  COMPREHENSIVE METABOLIC PANEL - Abnormal; Notable for the following components:      Result Value   BUN 54 (*)    Creatinine, Ser 10.55 (*)    Albumin 2.9 (*)    AST 11 (*)    GFR calc non Af Amer 5 (*)    GFR calc Af Amer 5 (*)    All other components within normal limits  CBC - Abnormal; Notable for the following components:   RBC 3.61 (*)    Hemoglobin 9.1 (*)    HCT 31.0 (*)    MCH 25.2 (*)    MCHC 29.4 (*)    RDW 18.1 (*)    All other components within normal limits  LIPASE, BLOOD    EKG None  Radiology No results found.  Procedures Ultrasound ED Soft Tissue  Date/Time: 11/05/2019 5:33 PM Performed by: Suzy Bouchard, PA-C Authorized by: Suzy Bouchard, PA-C   Procedure details:    Indications: localization of abscess     Transverse view:  Visualized   Longitudinal view:  Visualized   Images: archived     Limitations:  Positioning Location:    Location: abdominal wall     Side:  Left Findings:     abscess present    cellulitis present  .Marland KitchenIncision and Drainage  Date/Time: 11/05/2019 6:16 PM Performed by: Suzy Bouchard, PA-C Authorized by: Suzy Bouchard, PA-C   Consent:    Consent obtained:  Verbal   Consent given by:  Patient   Risks discussed:  Bleeding, incomplete drainage, pain and damage to other organs   Alternatives  discussed:  No treatment Universal protocol:    Procedure explained and questions answered to patient or proxy's satisfaction: yes     Relevant documents present and verified: yes     Test results available and properly labeled: yes     Imaging studies available: yes     Required blood products, implants, devices, and special equipment available: yes     Site/side marked: yes     Immediately prior to procedure a time out was called: yes     Patient identity confirmed:  Verbally with patient Location:    Type:  Abscess   Size:  4x3   Location:  Trunk   Trunk location:  Abdomen Pre-procedure details:    Skin preparation:  Betadine Anesthesia (see MAR for exact dosages):    Anesthesia method:  Local infiltration   Local anesthetic:  Lidocaine 1% WITH epi Procedure type:    Complexity:  Complex Procedure details:    Needle aspiration: no     Incision types:  Single straight   Incision depth:  Subcutaneous   Scalpel blade:  11   Wound management:  Probed and deloculated, irrigated with saline and extensive cleaning   Drainage:  Purulent   Drainage amount:  Moderate   Wound treatment:  Wound left open   Packing materials:  None Post-procedure details:    Patient tolerance of procedure:  Procedure terminated at patient's request Comments:     Patient requested to stop I&D after moderate amount of drainage   (including critical care time)  Medications Ordered in ED Medications  lidocaine (PF) (XYLOCAINE) 1 % injection 10 mL (has no administration in time range)  acetaminophen (TYLENOL)  tablet 650 mg (650 mg Oral Given 11/05/19 1524)  HYDROcodone-acetaminophen (NORCO/VICODIN) 5-325 MG per tablet 1 tablet (1 tablet Oral Given 11/05/19 1752)  povidone-iodine (BETADINE) 10 % external solution (1 application  Given 01/22/30 1753)    ED Course  I have reviewed the triage vital signs and the nursing notes.  Pertinent labs & imaging results that were available during my care of the  patient were reviewed by me and considered in my medical decision making (see chart for details).  Clinical Course as of Nov 04 1820  Sat Nov 05, 2019  1638 Temp(!): 100.8 F (38.2 C) [CA]    Clinical Course User Index [CA] Suzy Bouchard, [place]   MDM Rules/Calculators/A&P                     29 year old female presents to the ED due to an abscess in her left lower quadrant of her abdomen x1 week.  Upon arrival, patient is febrile at 100.68F and tachycardic at 110.  Patient no acute distress and nontoxic-appearing.  Patient has a history of ESRD on dialysis Tuesday, Thursday, and Saturday however missed dialysis today and did not complete a full dialysis on Thursday.  Routine labs obtained at triage.  CBC reassuring with no leukocytosis.  Hemoglobin appears slightly lower than baseline at 9.1.  CMP significant for worsening renal function with creatinine at 10.55 and BUN at 54 likely due to missed dialysis.  CMP otherwise reassuring.  Lipase normal at 35. Abscess confirmed with bedside ultrasound. Able to view full depth of abscess. I&D performed at bedside.  See procedure note above.  Patient requested to stop I&D after moderate amount of purulent drainage drained from site.  Will discharge patient with antibiotic given surrounding cellulitis and unable to fully drain abscess. Advised patient to follow-up with PCP on Monday for wound recheck. Encouraged warm compress 4 times daily. No concern for sepsis at this time.  Discussed case with Dr. Laverta Baltimore who evaluated patient at bedside and agrees with assessment and plan.  Final Clinical Impression(s) / ED Diagnoses Final diagnoses:  Abscess    Rx / DC Orders ED Discharge Orders         Ordered    doxycycline (VIBRAMYCIN) 100 MG capsule  2 times daily     11/05/19 1820           Karie Kirks 11/05/19 1823    Margette Fast, MD 11/06/19 1203

## 2019-11-08 DIAGNOSIS — N2581 Secondary hyperparathyroidism of renal origin: Secondary | ICD-10-CM | POA: Diagnosis not present

## 2019-11-08 DIAGNOSIS — N186 End stage renal disease: Secondary | ICD-10-CM | POA: Diagnosis not present

## 2019-11-08 DIAGNOSIS — D509 Iron deficiency anemia, unspecified: Secondary | ICD-10-CM | POA: Diagnosis not present

## 2019-11-08 DIAGNOSIS — Z992 Dependence on renal dialysis: Secondary | ICD-10-CM | POA: Diagnosis not present

## 2019-11-08 DIAGNOSIS — N390 Urinary tract infection, site not specified: Secondary | ICD-10-CM | POA: Diagnosis not present

## 2019-11-09 DIAGNOSIS — L02214 Cutaneous abscess of groin: Secondary | ICD-10-CM | POA: Diagnosis not present

## 2019-11-10 DIAGNOSIS — N2581 Secondary hyperparathyroidism of renal origin: Secondary | ICD-10-CM | POA: Diagnosis not present

## 2019-11-10 DIAGNOSIS — Z992 Dependence on renal dialysis: Secondary | ICD-10-CM | POA: Diagnosis not present

## 2019-11-10 DIAGNOSIS — N186 End stage renal disease: Secondary | ICD-10-CM | POA: Diagnosis not present

## 2019-11-10 DIAGNOSIS — N390 Urinary tract infection, site not specified: Secondary | ICD-10-CM | POA: Diagnosis not present

## 2019-11-10 DIAGNOSIS — D509 Iron deficiency anemia, unspecified: Secondary | ICD-10-CM | POA: Diagnosis not present

## 2019-11-11 ENCOUNTER — Ambulatory Visit: Payer: Medicare Other | Admitting: Vascular Surgery

## 2019-11-12 DIAGNOSIS — N390 Urinary tract infection, site not specified: Secondary | ICD-10-CM | POA: Diagnosis not present

## 2019-11-12 DIAGNOSIS — Z992 Dependence on renal dialysis: Secondary | ICD-10-CM | POA: Diagnosis not present

## 2019-11-12 DIAGNOSIS — N186 End stage renal disease: Secondary | ICD-10-CM | POA: Diagnosis not present

## 2019-11-12 DIAGNOSIS — D509 Iron deficiency anemia, unspecified: Secondary | ICD-10-CM | POA: Diagnosis not present

## 2019-11-12 DIAGNOSIS — N2581 Secondary hyperparathyroidism of renal origin: Secondary | ICD-10-CM | POA: Diagnosis not present

## 2019-11-15 DIAGNOSIS — D509 Iron deficiency anemia, unspecified: Secondary | ICD-10-CM | POA: Diagnosis not present

## 2019-11-15 DIAGNOSIS — N2581 Secondary hyperparathyroidism of renal origin: Secondary | ICD-10-CM | POA: Diagnosis not present

## 2019-11-15 DIAGNOSIS — N186 End stage renal disease: Secondary | ICD-10-CM | POA: Diagnosis not present

## 2019-11-15 DIAGNOSIS — Z992 Dependence on renal dialysis: Secondary | ICD-10-CM | POA: Diagnosis not present

## 2019-11-15 DIAGNOSIS — N390 Urinary tract infection, site not specified: Secondary | ICD-10-CM | POA: Diagnosis not present

## 2019-11-17 DIAGNOSIS — D509 Iron deficiency anemia, unspecified: Secondary | ICD-10-CM | POA: Diagnosis not present

## 2019-11-17 DIAGNOSIS — E039 Hypothyroidism, unspecified: Secondary | ICD-10-CM | POA: Diagnosis not present

## 2019-11-17 DIAGNOSIS — Z992 Dependence on renal dialysis: Secondary | ICD-10-CM | POA: Diagnosis not present

## 2019-11-17 DIAGNOSIS — R300 Dysuria: Secondary | ICD-10-CM | POA: Diagnosis not present

## 2019-11-17 DIAGNOSIS — N390 Urinary tract infection, site not specified: Secondary | ICD-10-CM | POA: Diagnosis not present

## 2019-11-17 DIAGNOSIS — N186 End stage renal disease: Secondary | ICD-10-CM | POA: Diagnosis not present

## 2019-11-17 DIAGNOSIS — N2581 Secondary hyperparathyroidism of renal origin: Secondary | ICD-10-CM | POA: Diagnosis not present

## 2019-11-19 DIAGNOSIS — N390 Urinary tract infection, site not specified: Secondary | ICD-10-CM | POA: Diagnosis not present

## 2019-11-19 DIAGNOSIS — D509 Iron deficiency anemia, unspecified: Secondary | ICD-10-CM | POA: Diagnosis not present

## 2019-11-19 DIAGNOSIS — N2581 Secondary hyperparathyroidism of renal origin: Secondary | ICD-10-CM | POA: Diagnosis not present

## 2019-11-19 DIAGNOSIS — N186 End stage renal disease: Secondary | ICD-10-CM | POA: Diagnosis not present

## 2019-11-19 DIAGNOSIS — Z992 Dependence on renal dialysis: Secondary | ICD-10-CM | POA: Diagnosis not present

## 2019-11-22 DIAGNOSIS — N390 Urinary tract infection, site not specified: Secondary | ICD-10-CM | POA: Diagnosis not present

## 2019-11-22 DIAGNOSIS — N186 End stage renal disease: Secondary | ICD-10-CM | POA: Diagnosis not present

## 2019-11-22 DIAGNOSIS — N2581 Secondary hyperparathyroidism of renal origin: Secondary | ICD-10-CM | POA: Diagnosis not present

## 2019-11-22 DIAGNOSIS — Z992 Dependence on renal dialysis: Secondary | ICD-10-CM | POA: Diagnosis not present

## 2019-11-22 DIAGNOSIS — D509 Iron deficiency anemia, unspecified: Secondary | ICD-10-CM | POA: Diagnosis not present

## 2019-11-24 DIAGNOSIS — D509 Iron deficiency anemia, unspecified: Secondary | ICD-10-CM | POA: Diagnosis not present

## 2019-11-24 DIAGNOSIS — Z992 Dependence on renal dialysis: Secondary | ICD-10-CM | POA: Diagnosis not present

## 2019-11-24 DIAGNOSIS — N390 Urinary tract infection, site not specified: Secondary | ICD-10-CM | POA: Diagnosis not present

## 2019-11-24 DIAGNOSIS — N2581 Secondary hyperparathyroidism of renal origin: Secondary | ICD-10-CM | POA: Diagnosis not present

## 2019-11-24 DIAGNOSIS — N186 End stage renal disease: Secondary | ICD-10-CM | POA: Diagnosis not present

## 2019-11-26 DIAGNOSIS — E871 Hypo-osmolality and hyponatremia: Secondary | ICD-10-CM | POA: Diagnosis not present

## 2019-11-26 DIAGNOSIS — N2581 Secondary hyperparathyroidism of renal origin: Secondary | ICD-10-CM | POA: Diagnosis not present

## 2019-11-26 DIAGNOSIS — N390 Urinary tract infection, site not specified: Secondary | ICD-10-CM | POA: Diagnosis not present

## 2019-11-26 DIAGNOSIS — N186 End stage renal disease: Secondary | ICD-10-CM | POA: Diagnosis not present

## 2019-11-26 DIAGNOSIS — Z992 Dependence on renal dialysis: Secondary | ICD-10-CM | POA: Diagnosis not present

## 2019-11-26 DIAGNOSIS — D631 Anemia in chronic kidney disease: Secondary | ICD-10-CM | POA: Diagnosis not present

## 2019-11-26 DIAGNOSIS — I120 Hypertensive chronic kidney disease with stage 5 chronic kidney disease or end stage renal disease: Secondary | ICD-10-CM | POA: Diagnosis not present

## 2019-11-29 DIAGNOSIS — N390 Urinary tract infection, site not specified: Secondary | ICD-10-CM | POA: Diagnosis not present

## 2019-11-29 DIAGNOSIS — N2581 Secondary hyperparathyroidism of renal origin: Secondary | ICD-10-CM | POA: Diagnosis not present

## 2019-11-29 DIAGNOSIS — E871 Hypo-osmolality and hyponatremia: Secondary | ICD-10-CM | POA: Diagnosis not present

## 2019-11-29 DIAGNOSIS — N186 End stage renal disease: Secondary | ICD-10-CM | POA: Diagnosis not present

## 2019-11-29 DIAGNOSIS — Z992 Dependence on renal dialysis: Secondary | ICD-10-CM | POA: Diagnosis not present

## 2019-12-03 DIAGNOSIS — N2581 Secondary hyperparathyroidism of renal origin: Secondary | ICD-10-CM | POA: Diagnosis not present

## 2019-12-03 DIAGNOSIS — Z992 Dependence on renal dialysis: Secondary | ICD-10-CM | POA: Diagnosis not present

## 2019-12-03 DIAGNOSIS — N186 End stage renal disease: Secondary | ICD-10-CM | POA: Diagnosis not present

## 2019-12-03 DIAGNOSIS — N390 Urinary tract infection, site not specified: Secondary | ICD-10-CM | POA: Diagnosis not present

## 2019-12-03 DIAGNOSIS — E871 Hypo-osmolality and hyponatremia: Secondary | ICD-10-CM | POA: Diagnosis not present

## 2019-12-06 DIAGNOSIS — N390 Urinary tract infection, site not specified: Secondary | ICD-10-CM | POA: Diagnosis not present

## 2019-12-06 DIAGNOSIS — Z992 Dependence on renal dialysis: Secondary | ICD-10-CM | POA: Diagnosis not present

## 2019-12-06 DIAGNOSIS — E871 Hypo-osmolality and hyponatremia: Secondary | ICD-10-CM | POA: Diagnosis not present

## 2019-12-06 DIAGNOSIS — N2581 Secondary hyperparathyroidism of renal origin: Secondary | ICD-10-CM | POA: Diagnosis not present

## 2019-12-06 DIAGNOSIS — N186 End stage renal disease: Secondary | ICD-10-CM | POA: Diagnosis not present

## 2019-12-08 DIAGNOSIS — N186 End stage renal disease: Secondary | ICD-10-CM | POA: Diagnosis not present

## 2019-12-08 DIAGNOSIS — N390 Urinary tract infection, site not specified: Secondary | ICD-10-CM | POA: Diagnosis not present

## 2019-12-08 DIAGNOSIS — N2581 Secondary hyperparathyroidism of renal origin: Secondary | ICD-10-CM | POA: Diagnosis not present

## 2019-12-08 DIAGNOSIS — E871 Hypo-osmolality and hyponatremia: Secondary | ICD-10-CM | POA: Diagnosis not present

## 2019-12-08 DIAGNOSIS — Z992 Dependence on renal dialysis: Secondary | ICD-10-CM | POA: Diagnosis not present

## 2019-12-10 DIAGNOSIS — N2581 Secondary hyperparathyroidism of renal origin: Secondary | ICD-10-CM | POA: Diagnosis not present

## 2019-12-10 DIAGNOSIS — N186 End stage renal disease: Secondary | ICD-10-CM | POA: Diagnosis not present

## 2019-12-10 DIAGNOSIS — Z992 Dependence on renal dialysis: Secondary | ICD-10-CM | POA: Diagnosis not present

## 2019-12-10 DIAGNOSIS — N390 Urinary tract infection, site not specified: Secondary | ICD-10-CM | POA: Diagnosis not present

## 2019-12-10 DIAGNOSIS — E871 Hypo-osmolality and hyponatremia: Secondary | ICD-10-CM | POA: Diagnosis not present

## 2019-12-13 DIAGNOSIS — N2581 Secondary hyperparathyroidism of renal origin: Secondary | ICD-10-CM | POA: Diagnosis not present

## 2019-12-13 DIAGNOSIS — N186 End stage renal disease: Secondary | ICD-10-CM | POA: Diagnosis not present

## 2019-12-13 DIAGNOSIS — E871 Hypo-osmolality and hyponatremia: Secondary | ICD-10-CM | POA: Diagnosis not present

## 2019-12-13 DIAGNOSIS — N390 Urinary tract infection, site not specified: Secondary | ICD-10-CM | POA: Diagnosis not present

## 2019-12-13 DIAGNOSIS — Z992 Dependence on renal dialysis: Secondary | ICD-10-CM | POA: Diagnosis not present

## 2019-12-15 ENCOUNTER — Telehealth (HOSPITAL_COMMUNITY): Payer: Self-pay

## 2019-12-15 DIAGNOSIS — Z992 Dependence on renal dialysis: Secondary | ICD-10-CM | POA: Diagnosis not present

## 2019-12-15 DIAGNOSIS — N186 End stage renal disease: Secondary | ICD-10-CM | POA: Diagnosis not present

## 2019-12-15 DIAGNOSIS — N390 Urinary tract infection, site not specified: Secondary | ICD-10-CM | POA: Diagnosis not present

## 2019-12-15 DIAGNOSIS — N2581 Secondary hyperparathyroidism of renal origin: Secondary | ICD-10-CM | POA: Diagnosis not present

## 2019-12-15 DIAGNOSIS — E871 Hypo-osmolality and hyponatremia: Secondary | ICD-10-CM | POA: Diagnosis not present

## 2019-12-15 NOTE — Telephone Encounter (Signed)

## 2019-12-16 ENCOUNTER — Ambulatory Visit: Payer: Medicare Other | Admitting: Vascular Surgery

## 2019-12-17 DIAGNOSIS — N186 End stage renal disease: Secondary | ICD-10-CM | POA: Diagnosis not present

## 2019-12-17 DIAGNOSIS — E871 Hypo-osmolality and hyponatremia: Secondary | ICD-10-CM | POA: Diagnosis not present

## 2019-12-17 DIAGNOSIS — N390 Urinary tract infection, site not specified: Secondary | ICD-10-CM | POA: Diagnosis not present

## 2019-12-17 DIAGNOSIS — Z992 Dependence on renal dialysis: Secondary | ICD-10-CM | POA: Diagnosis not present

## 2019-12-17 DIAGNOSIS — N2581 Secondary hyperparathyroidism of renal origin: Secondary | ICD-10-CM | POA: Diagnosis not present

## 2019-12-22 DIAGNOSIS — N390 Urinary tract infection, site not specified: Secondary | ICD-10-CM | POA: Diagnosis not present

## 2019-12-22 DIAGNOSIS — N2581 Secondary hyperparathyroidism of renal origin: Secondary | ICD-10-CM | POA: Diagnosis not present

## 2019-12-22 DIAGNOSIS — N186 End stage renal disease: Secondary | ICD-10-CM | POA: Diagnosis not present

## 2019-12-22 DIAGNOSIS — Z992 Dependence on renal dialysis: Secondary | ICD-10-CM | POA: Diagnosis not present

## 2019-12-22 DIAGNOSIS — E871 Hypo-osmolality and hyponatremia: Secondary | ICD-10-CM | POA: Diagnosis not present

## 2019-12-24 DIAGNOSIS — N186 End stage renal disease: Secondary | ICD-10-CM | POA: Diagnosis not present

## 2019-12-24 DIAGNOSIS — E871 Hypo-osmolality and hyponatremia: Secondary | ICD-10-CM | POA: Diagnosis not present

## 2019-12-24 DIAGNOSIS — Z992 Dependence on renal dialysis: Secondary | ICD-10-CM | POA: Diagnosis not present

## 2019-12-24 DIAGNOSIS — N390 Urinary tract infection, site not specified: Secondary | ICD-10-CM | POA: Diagnosis not present

## 2019-12-24 DIAGNOSIS — N2581 Secondary hyperparathyroidism of renal origin: Secondary | ICD-10-CM | POA: Diagnosis not present

## 2020-01-12 ENCOUNTER — Ambulatory Visit: Payer: Medicare Other | Admitting: Gastroenterology

## 2020-01-13 ENCOUNTER — Ambulatory Visit (INDEPENDENT_AMBULATORY_CARE_PROVIDER_SITE_OTHER): Payer: Medicare Other | Admitting: Vascular Surgery

## 2020-01-13 ENCOUNTER — Other Ambulatory Visit: Payer: Self-pay

## 2020-01-13 ENCOUNTER — Encounter: Payer: Self-pay | Admitting: Vascular Surgery

## 2020-01-13 VITALS — BP 143/83 | HR 81 | Temp 98.3°F | Resp 20 | Ht <= 58 in | Wt <= 1120 oz

## 2020-01-13 DIAGNOSIS — N186 End stage renal disease: Secondary | ICD-10-CM

## 2020-01-13 NOTE — Progress Notes (Signed)
Patient ID: [name], female   DOB: 1990/10/03, 29 y.o.   MRN: [id number]  Reason for Consult: Follow-up   Referred by Lucianne Lei, MD  Subjective:     HPI:  [name] is a 29 y.o. female history of end-stage renal left arm fistula.  She states she has had this fistula since she was 29 years old.  Recently had some swelling and what was thought to be an enlarged vein on the left side of her neck.  She is having difficulty with cannulation of the fistula but is currently being used.  She does not take any blood thinners.  She has not had any recent procedures on her left upper extremity fistula.  Past Medical History:  Diagnosis Date  . Anemia associated with chronic renal failure   . Blood transfusion   . 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  . Depression 05/14/2015  . Dialysis care   . ESRD (end stage renal disease) on dialysis ([place])   . GERD (gastroesophageal reflux disease) 01/07/2017  . HTN (hypertension) 05/02/2011  . Spina bifida   . UTI (lower urinary tract infection)    Family History  Problem Relation Age of Onset  . Kidney cancer Other   . Hypertension Maternal Grandmother   . Arthritis Maternal Grandmother   . Breast cancer Maternal Aunt   . Colon cancer Neg Hx    Past Surgical History:  Procedure Laterality Date  . AV FISTULA PLACEMENT     left arm    Short Social History:  Social History   Tobacco Use  . Smoking status: Former Smoker    Packs/day: 0.10    Types: Cigarettes  . Smokeless tobacco: Never Used  . Tobacco comment: 2 cigs a day  Substance Use Topics  . Alcohol use: No    Allergies  Allergen Reactions  . Ciprofloxacin Shortness Of Breath, Nausea And Vomiting and Other (See Comments)    HIGH FEVER and oral blisters   . Other Anaphylaxis    Revaclear dialzer  . Peanut-Containing Drug Products Anaphylaxis  . Aleve [Naproxen Sodium] Other (See Comments)    G.I.Bleed  . Ceftriaxone Other (See Comments)     Blisters in mouth   . Coconut Oil Hives  . Influenza Vaccines Nausea And Vomiting and Other (See Comments)    High fever  . Tetanus Toxoid, Adsorbed Nausea And Vomiting and Other (See Comments)    HIGH FEVER, also  . Tetanus Toxoids Nausea And Vomiting and Other (See Comments)    HIGH FEVER  . Latex Itching and Rash    Current Outpatient Medications  Medication Sig Dispense Refill  . acetaminophen (TYLENOL) 500 MG tablet Take 1,000 mg by mouth every 6 (six) hours as needed for mild pain or headache.    Marland Kitchen amLODipine (NORVASC) 10 MG tablet SMARTSIG:1 Tablet(s) By Mouth Every Evening    . AURYXIA 1 GM 210 MG(Fe) tablet Take 2 tablets by mouth 3 (three) times daily with meals.  3  . diphenhydrAMINE (BENADRYL) 25 mg capsule Take 25 mg by mouth daily as needed for allergies.    Marland Kitchen Epoetin Alfa (EPOGEN IJ) Inject as directed See admin instructions. Every Tuesday, Thursday, Saturday    . metoCLOPramide (REGLAN) 5 MG tablet Take 1 tablet (5 mg total) by mouth 4 (four) times daily. Take one tablet 1-2 hours before dialysis 45 tablet 1  . metoprolol succinate (TOPROL-XL) 50 MG 24 hr tablet Take 50 mg by mouth  daily.    . multivitamin (RENA-VIT) TABS tablet Take 1 tablet by mouth See admin instructions. Every Tuesday, Thursday, Saturday    . omeprazole (PRILOSEC) 20 MG capsule Take 20 mg by mouth daily.   3  . VELPHORO 500 MG chewable tablet Chew 500 mg by mouth 3 (three) times daily.     No current facility-administered medications for this visit.    Review of Systems  Constitutional:  Constitutional negative. HENT: HENT negative.  Eyes: Eyes negative.  GI: Gastrointestinal negative.  Musculoskeletal:       Patient confined to wheelchair Skin: Skin negative.  Neurological: Neurological negative. Hematologic: Hematologic/lymphatic negative.  Psychiatric: Psychiatric negative.        Objective:  Objective   Vitals:   01/13/20 1036  BP: (!) 143/83  Pulse: 81  Resp: 20  Temp: 98.3  F (36.8 C)  SpO2: 97%  Weight: 63 lb (28.6 kg)  Height: 2' (0.61 m)   Body mass index is 76.9 kg/m.  Physical Exam HENT:     Head: Normocephalic.     Nose:     Comments: Wearing a mask Eyes:     Pupils: Pupils are equal, round, and reactive to light.  Neck:     Comments: I can feel her fistula in the supraclavicular position she also has jugular venous distention on the right Cardiovascular:     Pulses:          Radial pulses are 2+ on the right side and 2+ on the left side.  Pulmonary:     Effort: Pulmonary effort is normal.  Abdominal:     General: Abdomen is flat.     Palpations: Abdomen is soft.  Musculoskeletal:     Cervical back: Neck supple.     Comments: Left arm with a large fistula there is a strong thrill no ulceration  Skin:    Capillary Refill: Capillary refill takes less than 2 seconds.  Neurological:     General: No focal deficit present.     Mental Status: She is alert.  Psychiatric:        Mood and Affect: Mood normal.        Behavior: Behavior normal.        Thought Content: Thought content normal.        Judgment: Judgment normal.     Data: No recent studies.  Most recent dialysis duplex demonstrated adequate flow volumes this was in February of this year.     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  She is also having some difficulty with cannulation.  I have offered her fistulogram at this time she is not sure if she wants this.  She can call to schedule on a nondialysis day.  She will otherwise follow-up on an as-needed basis.     [name] MD [facility]

## 2020-01-26 DIAGNOSIS — D631 Anemia in chronic kidney disease: Secondary | ICD-10-CM | POA: Diagnosis not present

## 2020-01-26 DIAGNOSIS — N2581 Secondary hyperparathyroidism of renal origin: Secondary | ICD-10-CM | POA: Diagnosis not present

## 2020-01-26 DIAGNOSIS — E871 Hypo-osmolality and hyponatremia: Secondary | ICD-10-CM | POA: Diagnosis not present

## 2020-01-26 DIAGNOSIS — Z992 Dependence on renal dialysis: Secondary | ICD-10-CM | POA: Diagnosis not present

## 2020-01-26 DIAGNOSIS — I120 Hypertensive chronic kidney disease with stage 5 chronic kidney disease or end stage renal disease: Secondary | ICD-10-CM | POA: Diagnosis not present

## 2020-01-26 DIAGNOSIS — N186 End stage renal disease: Secondary | ICD-10-CM | POA: Diagnosis not present

## 2020-01-28 DIAGNOSIS — N2581 Secondary hyperparathyroidism of renal origin: Secondary | ICD-10-CM | POA: Diagnosis not present

## 2020-01-28 DIAGNOSIS — N186 End stage renal disease: Secondary | ICD-10-CM | POA: Diagnosis not present

## 2020-01-28 DIAGNOSIS — E871 Hypo-osmolality and hyponatremia: Secondary | ICD-10-CM | POA: Diagnosis not present

## 2020-01-28 DIAGNOSIS — D631 Anemia in chronic kidney disease: Secondary | ICD-10-CM | POA: Diagnosis not present

## 2020-01-28 DIAGNOSIS — Z992 Dependence on renal dialysis: Secondary | ICD-10-CM | POA: Diagnosis not present

## 2020-01-31 DIAGNOSIS — D631 Anemia in chronic kidney disease: Secondary | ICD-10-CM | POA: Diagnosis not present

## 2020-01-31 DIAGNOSIS — N186 End stage renal disease: Secondary | ICD-10-CM | POA: Diagnosis not present

## 2020-01-31 DIAGNOSIS — N2581 Secondary hyperparathyroidism of renal origin: Secondary | ICD-10-CM | POA: Diagnosis not present

## 2020-01-31 DIAGNOSIS — Z992 Dependence on renal dialysis: Secondary | ICD-10-CM | POA: Diagnosis not present

## 2020-01-31 DIAGNOSIS — E871 Hypo-osmolality and hyponatremia: Secondary | ICD-10-CM | POA: Diagnosis not present

## 2020-02-02 DIAGNOSIS — N186 End stage renal disease: Secondary | ICD-10-CM | POA: Diagnosis not present

## 2020-02-02 DIAGNOSIS — E871 Hypo-osmolality and hyponatremia: Secondary | ICD-10-CM | POA: Diagnosis not present

## 2020-02-02 DIAGNOSIS — Z992 Dependence on renal dialysis: Secondary | ICD-10-CM | POA: Diagnosis not present

## 2020-02-02 DIAGNOSIS — N2581 Secondary hyperparathyroidism of renal origin: Secondary | ICD-10-CM | POA: Diagnosis not present

## 2020-02-02 DIAGNOSIS — D631 Anemia in chronic kidney disease: Secondary | ICD-10-CM | POA: Diagnosis not present

## 2020-02-04 DIAGNOSIS — D631 Anemia in chronic kidney disease: Secondary | ICD-10-CM | POA: Diagnosis not present

## 2020-02-04 DIAGNOSIS — N2581 Secondary hyperparathyroidism of renal origin: Secondary | ICD-10-CM | POA: Diagnosis not present

## 2020-02-04 DIAGNOSIS — E871 Hypo-osmolality and hyponatremia: Secondary | ICD-10-CM | POA: Diagnosis not present

## 2020-02-04 DIAGNOSIS — N186 End stage renal disease: Secondary | ICD-10-CM | POA: Diagnosis not present

## 2020-02-04 DIAGNOSIS — Z992 Dependence on renal dialysis: Secondary | ICD-10-CM | POA: Diagnosis not present

## 2020-02-07 DIAGNOSIS — E871 Hypo-osmolality and hyponatremia: Secondary | ICD-10-CM | POA: Diagnosis not present

## 2020-02-07 DIAGNOSIS — Z992 Dependence on renal dialysis: Secondary | ICD-10-CM | POA: Diagnosis not present

## 2020-02-07 DIAGNOSIS — D631 Anemia in chronic kidney disease: Secondary | ICD-10-CM | POA: Diagnosis not present

## 2020-02-07 DIAGNOSIS — N186 End stage renal disease: Secondary | ICD-10-CM | POA: Diagnosis not present

## 2020-02-07 DIAGNOSIS — N2581 Secondary hyperparathyroidism of renal origin: Secondary | ICD-10-CM | POA: Diagnosis not present

## 2020-02-09 DIAGNOSIS — Z992 Dependence on renal dialysis: Secondary | ICD-10-CM | POA: Diagnosis not present

## 2020-02-09 DIAGNOSIS — D631 Anemia in chronic kidney disease: Secondary | ICD-10-CM | POA: Diagnosis not present

## 2020-02-09 DIAGNOSIS — N186 End stage renal disease: Secondary | ICD-10-CM | POA: Diagnosis not present

## 2020-02-09 DIAGNOSIS — E871 Hypo-osmolality and hyponatremia: Secondary | ICD-10-CM | POA: Diagnosis not present

## 2020-02-09 DIAGNOSIS — N2581 Secondary hyperparathyroidism of renal origin: Secondary | ICD-10-CM | POA: Diagnosis not present

## 2020-02-11 DIAGNOSIS — E871 Hypo-osmolality and hyponatremia: Secondary | ICD-10-CM | POA: Diagnosis not present

## 2020-02-11 DIAGNOSIS — Z992 Dependence on renal dialysis: Secondary | ICD-10-CM | POA: Diagnosis not present

## 2020-02-11 DIAGNOSIS — D631 Anemia in chronic kidney disease: Secondary | ICD-10-CM | POA: Diagnosis not present

## 2020-02-11 DIAGNOSIS — N2581 Secondary hyperparathyroidism of renal origin: Secondary | ICD-10-CM | POA: Diagnosis not present

## 2020-02-11 DIAGNOSIS — N186 End stage renal disease: Secondary | ICD-10-CM | POA: Diagnosis not present

## 2020-02-14 DIAGNOSIS — D631 Anemia in chronic kidney disease: Secondary | ICD-10-CM | POA: Diagnosis not present

## 2020-02-14 DIAGNOSIS — E871 Hypo-osmolality and hyponatremia: Secondary | ICD-10-CM | POA: Diagnosis not present

## 2020-02-14 DIAGNOSIS — Z992 Dependence on renal dialysis: Secondary | ICD-10-CM | POA: Diagnosis not present

## 2020-02-14 DIAGNOSIS — N186 End stage renal disease: Secondary | ICD-10-CM | POA: Diagnosis not present

## 2020-02-14 DIAGNOSIS — N2581 Secondary hyperparathyroidism of renal origin: Secondary | ICD-10-CM | POA: Diagnosis not present

## 2020-02-16 DIAGNOSIS — N2581 Secondary hyperparathyroidism of renal origin: Secondary | ICD-10-CM | POA: Diagnosis not present

## 2020-02-16 DIAGNOSIS — Z992 Dependence on renal dialysis: Secondary | ICD-10-CM | POA: Diagnosis not present

## 2020-02-16 DIAGNOSIS — D631 Anemia in chronic kidney disease: Secondary | ICD-10-CM | POA: Diagnosis not present

## 2020-02-16 DIAGNOSIS — E039 Hypothyroidism, unspecified: Secondary | ICD-10-CM | POA: Diagnosis not present

## 2020-02-16 DIAGNOSIS — N186 End stage renal disease: Secondary | ICD-10-CM | POA: Diagnosis not present

## 2020-02-16 DIAGNOSIS — E871 Hypo-osmolality and hyponatremia: Secondary | ICD-10-CM | POA: Diagnosis not present

## 2020-02-18 DIAGNOSIS — N2581 Secondary hyperparathyroidism of renal origin: Secondary | ICD-10-CM | POA: Diagnosis not present

## 2020-02-18 DIAGNOSIS — E871 Hypo-osmolality and hyponatremia: Secondary | ICD-10-CM | POA: Diagnosis not present

## 2020-02-18 DIAGNOSIS — D631 Anemia in chronic kidney disease: Secondary | ICD-10-CM | POA: Diagnosis not present

## 2020-02-18 DIAGNOSIS — Z992 Dependence on renal dialysis: Secondary | ICD-10-CM | POA: Diagnosis not present

## 2020-02-18 DIAGNOSIS — N186 End stage renal disease: Secondary | ICD-10-CM | POA: Diagnosis not present

## 2020-02-21 DIAGNOSIS — Z992 Dependence on renal dialysis: Secondary | ICD-10-CM | POA: Diagnosis not present

## 2020-02-21 DIAGNOSIS — N2581 Secondary hyperparathyroidism of renal origin: Secondary | ICD-10-CM | POA: Diagnosis not present

## 2020-02-21 DIAGNOSIS — E871 Hypo-osmolality and hyponatremia: Secondary | ICD-10-CM | POA: Diagnosis not present

## 2020-02-21 DIAGNOSIS — N186 End stage renal disease: Secondary | ICD-10-CM | POA: Diagnosis not present

## 2020-02-21 DIAGNOSIS — D631 Anemia in chronic kidney disease: Secondary | ICD-10-CM | POA: Diagnosis not present

## 2020-02-23 DIAGNOSIS — D631 Anemia in chronic kidney disease: Secondary | ICD-10-CM | POA: Diagnosis not present

## 2020-02-23 DIAGNOSIS — Z992 Dependence on renal dialysis: Secondary | ICD-10-CM | POA: Diagnosis not present

## 2020-02-23 DIAGNOSIS — N186 End stage renal disease: Secondary | ICD-10-CM | POA: Diagnosis not present

## 2020-02-23 DIAGNOSIS — E871 Hypo-osmolality and hyponatremia: Secondary | ICD-10-CM | POA: Diagnosis not present

## 2020-02-23 DIAGNOSIS — N2581 Secondary hyperparathyroidism of renal origin: Secondary | ICD-10-CM | POA: Diagnosis not present

## 2020-02-25 DIAGNOSIS — N2581 Secondary hyperparathyroidism of renal origin: Secondary | ICD-10-CM | POA: Diagnosis not present

## 2020-02-25 DIAGNOSIS — N186 End stage renal disease: Secondary | ICD-10-CM | POA: Diagnosis not present

## 2020-02-25 DIAGNOSIS — E871 Hypo-osmolality and hyponatremia: Secondary | ICD-10-CM | POA: Diagnosis not present

## 2020-02-25 DIAGNOSIS — D631 Anemia in chronic kidney disease: Secondary | ICD-10-CM | POA: Diagnosis not present

## 2020-02-25 DIAGNOSIS — Z992 Dependence on renal dialysis: Secondary | ICD-10-CM | POA: Diagnosis not present

## 2020-02-26 DIAGNOSIS — I120 Hypertensive chronic kidney disease with stage 5 chronic kidney disease or end stage renal disease: Secondary | ICD-10-CM | POA: Diagnosis not present

## 2020-02-26 DIAGNOSIS — N186 End stage renal disease: Secondary | ICD-10-CM | POA: Diagnosis not present

## 2020-02-26 DIAGNOSIS — Z992 Dependence on renal dialysis: Secondary | ICD-10-CM | POA: Diagnosis not present

## 2020-02-28 DIAGNOSIS — Z992 Dependence on renal dialysis: Secondary | ICD-10-CM | POA: Diagnosis not present

## 2020-02-28 DIAGNOSIS — N186 End stage renal disease: Secondary | ICD-10-CM | POA: Diagnosis not present

## 2020-02-28 DIAGNOSIS — D631 Anemia in chronic kidney disease: Secondary | ICD-10-CM | POA: Diagnosis not present

## 2020-02-28 DIAGNOSIS — N2581 Secondary hyperparathyroidism of renal origin: Secondary | ICD-10-CM | POA: Diagnosis not present

## 2020-02-28 DIAGNOSIS — E871 Hypo-osmolality and hyponatremia: Secondary | ICD-10-CM | POA: Diagnosis not present

## 2020-03-01 DIAGNOSIS — N2581 Secondary hyperparathyroidism of renal origin: Secondary | ICD-10-CM | POA: Diagnosis not present

## 2020-03-01 DIAGNOSIS — N186 End stage renal disease: Secondary | ICD-10-CM | POA: Diagnosis not present

## 2020-03-01 DIAGNOSIS — Z992 Dependence on renal dialysis: Secondary | ICD-10-CM | POA: Diagnosis not present

## 2020-03-01 DIAGNOSIS — D631 Anemia in chronic kidney disease: Secondary | ICD-10-CM | POA: Diagnosis not present

## 2020-03-01 DIAGNOSIS — E871 Hypo-osmolality and hyponatremia: Secondary | ICD-10-CM | POA: Diagnosis not present

## 2020-03-03 DIAGNOSIS — D631 Anemia in chronic kidney disease: Secondary | ICD-10-CM | POA: Diagnosis not present

## 2020-03-03 DIAGNOSIS — Z992 Dependence on renal dialysis: Secondary | ICD-10-CM | POA: Diagnosis not present

## 2020-03-03 DIAGNOSIS — N186 End stage renal disease: Secondary | ICD-10-CM | POA: Diagnosis not present

## 2020-03-03 DIAGNOSIS — N2581 Secondary hyperparathyroidism of renal origin: Secondary | ICD-10-CM | POA: Diagnosis not present

## 2020-03-03 DIAGNOSIS — E871 Hypo-osmolality and hyponatremia: Secondary | ICD-10-CM | POA: Diagnosis not present

## 2020-03-06 DIAGNOSIS — D631 Anemia in chronic kidney disease: Secondary | ICD-10-CM | POA: Diagnosis not present

## 2020-03-06 DIAGNOSIS — E871 Hypo-osmolality and hyponatremia: Secondary | ICD-10-CM | POA: Diagnosis not present

## 2020-03-06 DIAGNOSIS — Z992 Dependence on renal dialysis: Secondary | ICD-10-CM | POA: Diagnosis not present

## 2020-03-06 DIAGNOSIS — N2581 Secondary hyperparathyroidism of renal origin: Secondary | ICD-10-CM | POA: Diagnosis not present

## 2020-03-06 DIAGNOSIS — N186 End stage renal disease: Secondary | ICD-10-CM | POA: Diagnosis not present

## 2020-03-10 DIAGNOSIS — N186 End stage renal disease: Secondary | ICD-10-CM | POA: Diagnosis not present

## 2020-03-10 DIAGNOSIS — Z992 Dependence on renal dialysis: Secondary | ICD-10-CM | POA: Diagnosis not present

## 2020-03-10 DIAGNOSIS — N2581 Secondary hyperparathyroidism of renal origin: Secondary | ICD-10-CM | POA: Diagnosis not present

## 2020-03-10 DIAGNOSIS — D631 Anemia in chronic kidney disease: Secondary | ICD-10-CM | POA: Diagnosis not present

## 2020-03-10 DIAGNOSIS — E871 Hypo-osmolality and hyponatremia: Secondary | ICD-10-CM | POA: Diagnosis not present

## 2020-03-13 ENCOUNTER — Emergency Department (HOSPITAL_COMMUNITY)
Admission: EM | Admit: 2020-03-13 | Discharge: 2020-03-13 | Disposition: A | Discharge status: Home / Self Care | Payer: Medicare Other | Attending: Emergency Medicine | Admitting: Emergency Medicine

## 2020-03-13 ENCOUNTER — Other Ambulatory Visit: Payer: Self-pay

## 2020-03-13 ENCOUNTER — Encounter (HOSPITAL_COMMUNITY): Payer: Self-pay | Admitting: *Deleted

## 2020-03-13 ENCOUNTER — Emergency Department (HOSPITAL_COMMUNITY): Payer: Medicare Other

## 2020-03-13 DIAGNOSIS — Z91040 Latex allergy status: Secondary | ICD-10-CM | POA: Diagnosis not present

## 2020-03-13 DIAGNOSIS — Z87891 Personal history of nicotine dependence: Secondary | ICD-10-CM | POA: Insufficient documentation

## 2020-03-13 DIAGNOSIS — N2581 Secondary hyperparathyroidism of renal origin: Secondary | ICD-10-CM | POA: Diagnosis not present

## 2020-03-13 DIAGNOSIS — Z20822 Contact with and (suspected) exposure to covid-19: Secondary | ICD-10-CM | POA: Diagnosis not present

## 2020-03-13 DIAGNOSIS — Z91010 Allergy to peanuts: Secondary | ICD-10-CM | POA: Insufficient documentation

## 2020-03-13 DIAGNOSIS — N186 End stage renal disease: Secondary | ICD-10-CM | POA: Diagnosis not present

## 2020-03-13 DIAGNOSIS — R059 Cough, unspecified: Secondary | ICD-10-CM

## 2020-03-13 DIAGNOSIS — R0602 Shortness of breath: Secondary | ICD-10-CM | POA: Diagnosis not present

## 2020-03-13 DIAGNOSIS — Z992 Dependence on renal dialysis: Secondary | ICD-10-CM | POA: Insufficient documentation

## 2020-03-13 DIAGNOSIS — E871 Hypo-osmolality and hyponatremia: Secondary | ICD-10-CM | POA: Diagnosis not present

## 2020-03-13 DIAGNOSIS — R05 Cough: Secondary | ICD-10-CM | POA: Diagnosis not present

## 2020-03-13 DIAGNOSIS — D631 Anemia in chronic kidney disease: Secondary | ICD-10-CM | POA: Diagnosis not present

## 2020-03-13 DIAGNOSIS — I120 Hypertensive chronic kidney disease with stage 5 chronic kidney disease or end stage renal disease: Secondary | ICD-10-CM | POA: Insufficient documentation

## 2020-03-13 DIAGNOSIS — J209 Acute bronchitis, unspecified: Secondary | ICD-10-CM | POA: Insufficient documentation

## 2020-03-13 DIAGNOSIS — Z79899 Other long term (current) drug therapy: Secondary | ICD-10-CM | POA: Insufficient documentation

## 2020-03-13 LAB — RESP PANEL BY RT PCR (RSV, FLU A&B, COVID)
Influenza A by PCR: NEGATIVE
Influenza B by PCR: NEGATIVE
Respiratory Syncytial Virus by PCR: NEGATIVE
SARS Coronavirus 2 by RT PCR: NEGATIVE

## 2020-03-13 MED ORDER — DOXYCYCLINE HYCLATE 100 MG PO TABS
100.0000 mg | ORAL_TABLET | Freq: Once | ORAL | Status: AC
  Administered 2020-03-13: 100 mg via ORAL
  Filled 2020-03-13: qty 1

## 2020-03-13 MED ORDER — IPRATROPIUM-ALBUTEROL 0.5-2.5 (3) MG/3ML IN SOLN
3.0000 mL | Freq: Once | RESPIRATORY_TRACT | Status: AC
  Administered 2020-03-13: 3 mL via RESPIRATORY_TRACT
  Filled 2020-03-13: qty 3

## 2020-03-13 MED ORDER — ALBUTEROL SULFATE (2.5 MG/3ML) 0.083% IN NEBU
5.0000 mg | INHALATION_SOLUTION | Freq: Once | RESPIRATORY_TRACT | Status: AC
  Administered 2020-03-13: 5 mg via RESPIRATORY_TRACT
  Filled 2020-03-13: qty 6

## 2020-03-13 MED ORDER — DOXYCYCLINE HYCLATE 100 MG PO CAPS: 100.0000 mg | ORAL_CAPSULE | Freq: Two times a day (BID) | ORAL | 0 refills | Status: DC

## 2020-03-13 MED ORDER — ALBUTEROL SULFATE (2.5 MG/3ML) 0.083% IN NEBU: 5.0000 mg | INHALATION_SOLUTION | Freq: Once | RESPIRATORY_TRACT | Status: DC

## 2020-03-13 MED ORDER — AEROCHAMBER Z-STAT PLUS/MEDIUM MISC: 1.0000 | Freq: Once | Status: DC

## 2020-03-13 MED ORDER — DEXAMETHASONE SODIUM PHOSPHATE 10 MG/ML IJ SOLN
8.0000 mg | Freq: Once | INTRAMUSCULAR | Status: AC
  Administered 2020-03-13: 8 mg via INTRAMUSCULAR
  Filled 2020-03-13: qty 1

## 2020-03-13 MED ORDER — PREDNISONE 50 MG PO TABS: ORAL_TABLET | ORAL | 0 refills | Status: DC

## 2020-03-13 MED ORDER — ALBUTEROL SULFATE HFA 108 (90 BASE) MCG/ACT IN AERS
2.0000 | INHALATION_SPRAY | Freq: Once | RESPIRATORY_TRACT | Status: AC
  Administered 2020-03-13: 2 via RESPIRATORY_TRACT
  Filled 2020-03-13: qty 6.7

## 2020-03-13 NOTE — ED Triage Notes (Signed)
Sent from dialysis for evaluation of cough

## 2020-03-13 NOTE — ED Provider Notes (Signed)
[facility] EMERGENCY DEPARTMENT Provider Note   CSN: [id number] Arrival date & time: 03/13/20  1050     History Chief Complaint  Patient presents with  . Cough    [name] is a 29 y.o. female with a history of kidney failure on dialysis, last full treatment just prior to arrival, GERD, hypertension, spina bifida who is wheelchair-bound presenting for evaluation of cough and shortness of breath along with wheezing.  She denies history of asthma, states this feels like bronchitis which she has had in the past.  Her cough has been wet sounding but nonproductive.  She has had no chest pain, denies fevers or chills, no known exposures to COVID-19.  Also no nausea, vomiting or abdominal pain.  She is advised by nursing at the dialysis center to come here for further evaluation of her cough.  She has taken Alka-Seltzer cold and cough formula with no significant improvement in her symptoms.  The history is provided by the patient.       Past Medical History:  Diagnosis Date  . Anemia associated with chronic renal failure   . Blood transfusion   . 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  . Depression 05/14/2015  . Dialysis care   . ESRD (end stage renal disease) on dialysis ([place])   . GERD (gastroesophageal reflux disease) 01/07/2017  . HTN (hypertension) 05/02/2011  . Spina bifida   . UTI (lower urinary tract infection)     Patient Active Problem List   Diagnosis Date Noted  . Acute UTI 05/07/2018  . Tobacco use 05/07/2018  . Hypokalemia 05/07/2018  . GERD (gastroesophageal reflux disease) 01/07/2017  . Nausea with vomiting 01/07/2017  . Pyelonephritis 07/31/2015  . Back pain 07/31/2015  . Abdominal pain 07/31/2015  . Constipation 07/31/2015  . Major depressive disorder, single episode, severe without psychotic features ([place]) 05/15/2015  . Spina bifida ([place]) 01/18/2012  . ESRD (end stage renal disease) on dialysis ([place]) 05/02/2011  . Anemia 05/02/2011    . HTN (hypertension) 05/02/2011    Past Surgical History:  Procedure Laterality Date  . AV FISTULA PLACEMENT     left arm     OB History    Gravida  0   Para  0   Term  0   Preterm  0   AB  0   Living  0     SAB  0   TAB  0   Ectopic  0   Multiple  0   Live Births  0           Family History  Problem Relation Age of Onset  . Kidney cancer Other   . Hypertension Maternal Grandmother   . Arthritis Maternal Grandmother   . Breast cancer Maternal Aunt   . Colon cancer Neg Hx     Social History   Tobacco Use  . Smoking status: Former Smoker    Packs/day: 0.10    Types: Cigarettes  . Smokeless tobacco: Never Used  . Tobacco comment: 2 cigs a day  Vaping Use  . Vaping Use: Never used  Substance Use Topics  . Alcohol use: No  . Drug use: No    Home Medications Prior to Admission medications   Medication Sig Start Date End Date Taking? Authorizing Provider  acetaminophen (TYLENOL) 500 MG tablet Take 1,000 mg by mouth every 6 (six) hours as needed for mild pain or headache.    [provider]  amLODipine ([place])  10 MG tablet SMARTSIG:1 Tablet(s) By Mouth Every Evening 11/24/19   [provider]  AURYXIA 1 GM 210 MG(Fe) tablet Take 2 tablets by mouth 3 (three) times daily with meals. 04/15/18   [provider]  diphenhydrAMINE (BENADRYL) 25 mg capsule Take 25 mg by mouth daily as needed for allergies.    [provider]  doxycycline (VIBRAMYCIN) 100 MG capsule Take 1 capsule (100 mg total) by mouth 2 (two) times daily. 03/13/20   Evalee Jefferson, PA-C  Epoetin Alfa (EPOGEN IJ) Inject as directed See admin instructions. Every Tuesday, Thursday, Saturday    [provider]  metoCLOPramide (REGLAN) 5 MG tablet Take 1 tablet (5 mg total) by mouth 4 (four) times daily. Take one tablet 1-2 hours before dialysis 11/02/19   Doran Stabler, MD  metoprolol succinate (TOPROL-XL) 50 MG 24 hr tablet Take 50 mg by mouth daily.  12/29/19   [provider]  multivitamin (RENA-VIT) TABS tablet Take 1 tablet by mouth See admin instructions. Every Tuesday, Thursday, Saturday    [provider]  omeprazole (PRILOSEC) 20 MG capsule Take 20 mg by mouth daily.  03/26/16   [provider]  predniSONE (DELTASONE) 50 MG tablet Take one tablet daily for 4 days. 03/14/20   Evalee Jefferson, PA-C  VELPHORO 500 MG chewable tablet Chew 500 mg by mouth 3 (three) times daily. 11/17/19   [provider]    Allergies    Ciprofloxacin; Other; Peanut-containing drug products; Aleve [naproxen sodium]; Ceftriaxone; Coconut oil; Influenza vaccines; Tetanus toxoid, adsorbed; Tetanus toxoids; and Latex  Review of Systems   Review of Systems  Constitutional: Negative for chills and fever.  HENT: Negative for congestion and sore throat.   Eyes: Negative.   Respiratory: Positive for cough, shortness of breath and wheezing. Negative for chest tightness.   Cardiovascular: Negative for chest pain and palpitations.  Gastrointestinal: Negative for abdominal pain, nausea and vomiting.  Genitourinary: Negative.   Musculoskeletal: Negative for arthralgias, joint swelling and neck pain.  Skin: Negative.  Negative for rash and wound.  Neurological: Negative for dizziness, weakness, light-headedness, numbness and headaches.  Psychiatric/Behavioral: Negative.     Physical Exam Updated Vital Signs BP (!) 163/94 (BP Location: Left Arm)   Pulse (!) 101   Temp 98.8 F (37.1 C) (Oral)   Resp 18   Ht 2' (0.61 m)   Wt 26.3 kg   SpO2 98%   BMI 70.80 kg/m   Physical Exam Vitals and nursing note reviewed.  Constitutional:      Appearance: She is well-developed.  HENT:     Head: Normocephalic and atraumatic.     Mouth/Throat:     Mouth: Mucous membranes are moist.  Eyes:     Conjunctiva/sclera: Conjunctivae normal.  Cardiovascular:     Rate and Rhythm: Normal rate and regular rhythm.     Heart sounds: Normal heart  sounds.  Pulmonary:     Effort: Pulmonary effort is normal.     Breath sounds: No stridor. Wheezing present. No rhonchi or rales.     Comments: Expiratory wheeze throughout all lung fields with prolonged expirations. Abdominal:     General: Bowel sounds are normal.     Palpations: Abdomen is soft.     Tenderness: There is no abdominal tenderness.  Musculoskeletal:        General: Normal range of motion.     Cervical back: Normal range of motion.     Comments: Pronounced mid back kyphosis.  Skin:  General: Skin is warm and dry.  Neurological:     Mental Status: She is alert.     ED Results / Procedures / Treatments   Labs (all labs ordered are listed, but only abnormal results are displayed) Labs Reviewed  RESP PANEL BY RT PCR (RSV, FLU A&B, COVID)    EKG None  Radiology DG Chest Port 1 View  Result Date: 03/13/2020 CLINICAL DATA:  29 year old female with cough and congestion since Friday. Dialysis patient. EXAM: PORTABLE CHEST 1 VIEW COMPARISON:  CTA chest 09/20/2019 and earlier. FINDINGS: Portable AP upright view at 1137 hours. Stable lung volumes and mediastinal contours, cardiac size at the upper limits of normal. Allowing for portable technique the lungs are clear. No pneumothorax or pleural effusion. Visualized tracheal air column is within normal limits. No acute osseous abnormality identified. Negative visible bowel gas pattern. IMPRESSION: No acute cardiopulmonary abnormality. Stable cardiac size at the upper limits of normal. Electronically Signed   By: Genevie Ann M.D.   On: 03/13/2020 11:44    Procedures Procedures (including critical care time)  Medications Ordered in ED Medications  aerochamber Z-Stat Plus/medium 1 each (has no administration in time range)  albuterol (VENTOLIN HFA) 108 (90 Base) MCG/ACT inhaler 2 puff (2 puffs Inhalation Given 03/13/20 1218)  dexamethasone (DECADRON) injection 8 mg (8 mg Intramuscular Given 03/13/20 1216)  albuterol (PROVENTIL)  (2.5 MG/3ML) 0.083% nebulizer solution 5 mg (5 mg Nebulization Given 03/13/20 1448)  ipratropium-albuterol (DUONEB) 0.5-2.5 (3) MG/3ML nebulizer solution 3 mL (3 mLs Nebulization Given 03/13/20 1448)  doxycycline (VIBRA-TABS) tablet 100 mg (100 mg Oral Given 03/13/20 1435)    ED Course  I have reviewed the triage vital signs and the nursing notes.  Pertinent labs & imaging results that were available during my care of the patient were reviewed by me and considered in my medical decision making (see chart for details).    MDM Rules/Calculators/A&P                          Chest x-ray reviewed and is negative for pneumonia.  Her respiratory panel was negative for COVID-19, also negative for influenza and RSV.  She was given an albuterol MDI inhaler with no significant improvement in her wheezing symptoms.  After her respiratory panel was back and negative we then proceeded to an albuterol and Atrovent neb treatment which did significantly improve her symptoms.  She was still wheezing at recheck but she stated her breathing was much more comfortable she did not feel short of breath at this time.  She was also given IM Decadron while here.  Also covered for possible bacterial infection with doxycycline.  She was given strict return precautions, otherwise advised to complete her doxycycline.  She was given pulse dosing of prednisone and she also went home with her albuterol MDI for every 4 hours use.  She was borderline tachycardic at time of discharge, but I suspect this was a side effect of the albuterol doses given. Final Clinical Impression(s) / ED Diagnoses Final diagnoses:  Acute bronchitis, unspecified organism    Rx / DC Orders ED Discharge Orders         Ordered    predniSONE (DELTASONE) 50 MG tablet     Discontinue  Reprint     03/13/20 1539    doxycycline (VIBRAMYCIN) 100 MG capsule  2 times daily     Discontinue  Reprint     03/13/20 1539  Evalee Jefferson, PA-C 03/13/20  1627    Milton Ferguson, MD 03/14/20 [phone]

## 2020-03-13 NOTE — Discharge Instructions (Addendum)
As discussed, take your next dose of prednisone tomorrow, take 1 more dose of the doxycycline which is an antibiotic this evening.  Use your inhaler taking 2 puffs every 4 hours if you need it for wheezing, shortness of breath or worsening cough.  Return here if you develop any worsening symptoms such as shortness of breath or weakness.  Otherwise call your primary MD for recheck as needed.

## 2020-03-13 NOTE — ED Notes (Signed)
Patient continues to wait on diet tray.

## 2020-03-15 DIAGNOSIS — E871 Hypo-osmolality and hyponatremia: Secondary | ICD-10-CM | POA: Diagnosis not present

## 2020-03-15 DIAGNOSIS — N186 End stage renal disease: Secondary | ICD-10-CM | POA: Diagnosis not present

## 2020-03-15 DIAGNOSIS — N2581 Secondary hyperparathyroidism of renal origin: Secondary | ICD-10-CM | POA: Diagnosis not present

## 2020-03-15 DIAGNOSIS — Z992 Dependence on renal dialysis: Secondary | ICD-10-CM | POA: Diagnosis not present

## 2020-03-15 DIAGNOSIS — D631 Anemia in chronic kidney disease: Secondary | ICD-10-CM | POA: Diagnosis not present

## 2020-03-17 DIAGNOSIS — D631 Anemia in chronic kidney disease: Secondary | ICD-10-CM | POA: Diagnosis not present

## 2020-03-17 DIAGNOSIS — E871 Hypo-osmolality and hyponatremia: Secondary | ICD-10-CM | POA: Diagnosis not present

## 2020-03-17 DIAGNOSIS — N186 End stage renal disease: Secondary | ICD-10-CM | POA: Diagnosis not present

## 2020-03-17 DIAGNOSIS — Z992 Dependence on renal dialysis: Secondary | ICD-10-CM | POA: Diagnosis not present

## 2020-03-17 DIAGNOSIS — N2581 Secondary hyperparathyroidism of renal origin: Secondary | ICD-10-CM | POA: Diagnosis not present

## 2020-03-20 DIAGNOSIS — D631 Anemia in chronic kidney disease: Secondary | ICD-10-CM | POA: Diagnosis not present

## 2020-03-20 DIAGNOSIS — E871 Hypo-osmolality and hyponatremia: Secondary | ICD-10-CM | POA: Diagnosis not present

## 2020-03-20 DIAGNOSIS — N186 End stage renal disease: Secondary | ICD-10-CM | POA: Diagnosis not present

## 2020-03-20 DIAGNOSIS — Z992 Dependence on renal dialysis: Secondary | ICD-10-CM | POA: Diagnosis not present

## 2020-03-20 DIAGNOSIS — N2581 Secondary hyperparathyroidism of renal origin: Secondary | ICD-10-CM | POA: Diagnosis not present

## 2020-03-21 DIAGNOSIS — Z992 Dependence on renal dialysis: Secondary | ICD-10-CM | POA: Diagnosis not present

## 2020-03-21 DIAGNOSIS — N186 End stage renal disease: Secondary | ICD-10-CM | POA: Diagnosis not present

## 2020-03-21 DIAGNOSIS — N2581 Secondary hyperparathyroidism of renal origin: Secondary | ICD-10-CM | POA: Diagnosis not present

## 2020-03-21 DIAGNOSIS — E871 Hypo-osmolality and hyponatremia: Secondary | ICD-10-CM | POA: Diagnosis not present

## 2020-03-21 DIAGNOSIS — D631 Anemia in chronic kidney disease: Secondary | ICD-10-CM | POA: Diagnosis not present

## 2020-03-24 DIAGNOSIS — E871 Hypo-osmolality and hyponatremia: Secondary | ICD-10-CM | POA: Diagnosis not present

## 2020-03-24 DIAGNOSIS — Z992 Dependence on renal dialysis: Secondary | ICD-10-CM | POA: Diagnosis not present

## 2020-03-24 DIAGNOSIS — N186 End stage renal disease: Secondary | ICD-10-CM | POA: Diagnosis not present

## 2020-03-24 DIAGNOSIS — D631 Anemia in chronic kidney disease: Secondary | ICD-10-CM | POA: Diagnosis not present

## 2020-03-24 DIAGNOSIS — N2581 Secondary hyperparathyroidism of renal origin: Secondary | ICD-10-CM | POA: Diagnosis not present

## 2020-03-27 DIAGNOSIS — D631 Anemia in chronic kidney disease: Secondary | ICD-10-CM | POA: Diagnosis not present

## 2020-03-27 DIAGNOSIS — E871 Hypo-osmolality and hyponatremia: Secondary | ICD-10-CM | POA: Diagnosis not present

## 2020-03-27 DIAGNOSIS — Z992 Dependence on renal dialysis: Secondary | ICD-10-CM | POA: Diagnosis not present

## 2020-03-27 DIAGNOSIS — N2581 Secondary hyperparathyroidism of renal origin: Secondary | ICD-10-CM | POA: Diagnosis not present

## 2020-03-27 DIAGNOSIS — N186 End stage renal disease: Secondary | ICD-10-CM | POA: Diagnosis not present

## 2020-03-28 DIAGNOSIS — I120 Hypertensive chronic kidney disease with stage 5 chronic kidney disease or end stage renal disease: Secondary | ICD-10-CM | POA: Diagnosis not present

## 2020-03-28 DIAGNOSIS — Z992 Dependence on renal dialysis: Secondary | ICD-10-CM | POA: Diagnosis not present

## 2020-03-28 DIAGNOSIS — N186 End stage renal disease: Secondary | ICD-10-CM | POA: Diagnosis not present

## 2020-03-29 DIAGNOSIS — Z992 Dependence on renal dialysis: Secondary | ICD-10-CM | POA: Diagnosis not present

## 2020-03-29 DIAGNOSIS — N2581 Secondary hyperparathyroidism of renal origin: Secondary | ICD-10-CM | POA: Diagnosis not present

## 2020-03-29 DIAGNOSIS — D631 Anemia in chronic kidney disease: Secondary | ICD-10-CM | POA: Diagnosis not present

## 2020-03-29 DIAGNOSIS — N186 End stage renal disease: Secondary | ICD-10-CM | POA: Diagnosis not present

## 2020-03-29 DIAGNOSIS — E871 Hypo-osmolality and hyponatremia: Secondary | ICD-10-CM | POA: Diagnosis not present

## 2020-03-29 DIAGNOSIS — R300 Dysuria: Secondary | ICD-10-CM | POA: Diagnosis not present

## 2020-03-31 ENCOUNTER — Emergency Department (HOSPITAL_COMMUNITY)
Admission: EM | Admit: 2020-03-31 | Discharge: 2020-03-31 | Disposition: A | Discharge status: Home / Self Care | Payer: Medicare Other | Attending: Emergency Medicine | Admitting: Emergency Medicine

## 2020-03-31 ENCOUNTER — Other Ambulatory Visit: Payer: Self-pay

## 2020-03-31 ENCOUNTER — Encounter (HOSPITAL_COMMUNITY): Payer: Self-pay | Admitting: Emergency Medicine

## 2020-03-31 ENCOUNTER — Emergency Department (HOSPITAL_COMMUNITY): Payer: Medicare Other

## 2020-03-31 DIAGNOSIS — Z91010 Allergy to peanuts: Secondary | ICD-10-CM | POA: Diagnosis not present

## 2020-03-31 DIAGNOSIS — R0602 Shortness of breath: Secondary | ICD-10-CM | POA: Insufficient documentation

## 2020-03-31 DIAGNOSIS — I491 Atrial premature depolarization: Secondary | ICD-10-CM | POA: Diagnosis not present

## 2020-03-31 DIAGNOSIS — E871 Hypo-osmolality and hyponatremia: Secondary | ICD-10-CM | POA: Diagnosis not present

## 2020-03-31 DIAGNOSIS — I517 Cardiomegaly: Secondary | ICD-10-CM | POA: Diagnosis not present

## 2020-03-31 DIAGNOSIS — Z992 Dependence on renal dialysis: Secondary | ICD-10-CM | POA: Insufficient documentation

## 2020-03-31 DIAGNOSIS — R300 Dysuria: Secondary | ICD-10-CM | POA: Diagnosis not present

## 2020-03-31 DIAGNOSIS — I499 Cardiac arrhythmia, unspecified: Secondary | ICD-10-CM | POA: Diagnosis not present

## 2020-03-31 DIAGNOSIS — N186 End stage renal disease: Secondary | ICD-10-CM | POA: Insufficient documentation

## 2020-03-31 DIAGNOSIS — J90 Pleural effusion, not elsewhere classified: Secondary | ICD-10-CM | POA: Diagnosis not present

## 2020-03-31 DIAGNOSIS — R0789 Other chest pain: Secondary | ICD-10-CM | POA: Insufficient documentation

## 2020-03-31 DIAGNOSIS — R079 Chest pain, unspecified: Secondary | ICD-10-CM | POA: Diagnosis not present

## 2020-03-31 DIAGNOSIS — Z79899 Other long term (current) drug therapy: Secondary | ICD-10-CM | POA: Insufficient documentation

## 2020-03-31 DIAGNOSIS — J811 Chronic pulmonary edema: Secondary | ICD-10-CM | POA: Diagnosis not present

## 2020-03-31 DIAGNOSIS — N2581 Secondary hyperparathyroidism of renal origin: Secondary | ICD-10-CM | POA: Diagnosis not present

## 2020-03-31 DIAGNOSIS — I120 Hypertensive chronic kidney disease with stage 5 chronic kidney disease or end stage renal disease: Secondary | ICD-10-CM | POA: Insufficient documentation

## 2020-03-31 DIAGNOSIS — R0689 Other abnormalities of breathing: Secondary | ICD-10-CM | POA: Diagnosis not present

## 2020-03-31 DIAGNOSIS — J984 Other disorders of lung: Secondary | ICD-10-CM | POA: Diagnosis not present

## 2020-03-31 DIAGNOSIS — I493 Ventricular premature depolarization: Secondary | ICD-10-CM | POA: Diagnosis not present

## 2020-03-31 LAB — BASIC METABOLIC PANEL
Anion gap: 16 — ABNORMAL HIGH (ref 5–15)
BUN: 48 mg/dL — ABNORMAL HIGH (ref 6–20)
CO2: 24 mmol/L (ref 22–32)
Calcium: 8.6 mg/dL — ABNORMAL LOW (ref 8.9–10.3)
Chloride: 100 mmol/L (ref 98–111)
Creatinine, Ser: 7.18 mg/dL — ABNORMAL HIGH (ref 0.44–1.00)
GFR calc Af Amer: 8 mL/min — ABNORMAL LOW (ref >60)
GFR calc non Af Amer: 7 mL/min — ABNORMAL LOW (ref >60)
Glucose, Bld: 112 mg/dL — ABNORMAL HIGH (ref 70–99)
Potassium: 4 mmol/L (ref 3.5–5.1)
Sodium: 140 mmol/L (ref 135–145)

## 2020-03-31 LAB — CBC WITH DIFFERENTIAL/PLATELET
Abs Immature Granulocytes: 0.01 10*3/uL (ref 0.00–0.07)
Basophils Absolute: 0 10*3/uL (ref 0.0–0.1)
Basophils Relative: 0 %
Eosinophils Absolute: 0.5 10*3/uL (ref 0.0–0.5)
Eosinophils Relative: 8 %
HCT: 33.1 % — ABNORMAL LOW (ref 36.0–46.0)
Hemoglobin: 9.8 g/dL — ABNORMAL LOW (ref 12.0–15.0)
Immature Granulocytes: 0 %
Lymphocytes Relative: 26 %
Lymphs Abs: 1.6 10*3/uL (ref 0.7–4.0)
MCH: 27.1 pg (ref 26.0–34.0)
MCHC: 29.6 g/dL — ABNORMAL LOW (ref 30.0–36.0)
MCV: 91.4 fL (ref 80.0–100.0)
Monocytes Absolute: 0.4 10*3/uL (ref 0.1–1.0)
Monocytes Relative: 6 %
Neutro Abs: 3.7 10*3/uL (ref 1.7–7.7)
Neutrophils Relative %: 60 %
Platelets: 195 10*3/uL (ref 150–400)
RBC: 3.62 MIL/uL — ABNORMAL LOW (ref 3.87–5.11)
RDW: 22 % — ABNORMAL HIGH (ref 11.5–15.5)
WBC: 6.2 10*3/uL (ref 4.0–10.5)
nRBC: 0 % (ref 0.0–0.2)

## 2020-03-31 LAB — HEPATIC FUNCTION PANEL
ALT: 26 U/L (ref 0–44)
AST: 19 U/L (ref 15–41)
Albumin: 2.7 g/dL — ABNORMAL LOW (ref 3.5–5.0)
Alkaline Phosphatase: 48 U/L (ref 38–126)
Bilirubin, Direct: <0.1 mg/dL (ref 0.0–0.2)
Total Bilirubin: 0.5 mg/dL (ref 0.3–1.2)
Total Protein: 5.8 g/dL — ABNORMAL LOW (ref 6.5–8.1)

## 2020-03-31 LAB — D-DIMER, QUANTITATIVE: D-Dimer, Quant: 0.75 ug/mL-FEU — ABNORMAL HIGH (ref 0.00–0.50)

## 2020-03-31 LAB — LIPASE, BLOOD: Lipase: 31 U/L (ref 11–51)

## 2020-03-31 LAB — TROPONIN I (HIGH SENSITIVITY)
Troponin I (High Sensitivity): 15 ng/L (ref <18)
Troponin I (High Sensitivity): 12 ng/L (ref <18)

## 2020-03-31 MED ORDER — IOHEXOL 350 MG/ML SOLN
100.0000 mL | Freq: Once | INTRAVENOUS | Status: AC | PRN
  Administered 2020-03-31: 100 mL via INTRAVENOUS

## 2020-03-31 MED ORDER — AMLODIPINE BESYLATE 5 MG PO TABS
10.0000 mg | ORAL_TABLET | Freq: Once | ORAL | Status: DC
  Filled 2020-03-31: qty 2

## 2020-03-31 MED ORDER — LIDOCAINE VISCOUS HCL 2 % MT SOLN
15.0000 mL | Freq: Once | OROMUCOSAL | Status: AC
  Administered 2020-03-31: 15 mL via ORAL
  Filled 2020-03-31: qty 15

## 2020-03-31 MED ORDER — ALUM & MAG HYDROXIDE-SIMETH 200-200-20 MG/5ML PO SUSP
30.0000 mL | Freq: Once | ORAL | Status: AC
  Administered 2020-03-31: 30 mL via ORAL
  Filled 2020-03-31: qty 30

## 2020-03-31 NOTE — ED Triage Notes (Signed)
Patient brought in by EMS for chest pain that started earlier today. EMS reports patient showing PVC's on their monitor.

## 2020-03-31 NOTE — ED Provider Notes (Signed)
[facility] EMERGENCY DEPARTMENT Provider Note   CSN: [id number] Arrival date & time: 03/31/20  0014     History Chief Complaint  Patient presents with  . Chest Pain    [name] is a 29 y.o. female.  Patient with ESRD, hypertension, spina bifida,, regression syndrome here with chest pain that onset around 8 PM. She reports sharp stabbing pain in the center of her chest that last for a few seconds at a time. Pain is coming and going somewhat worse with palpation and movement. Does not know how many episodes she has had but states only last a few seconds. Some radiation to her neck. No back pain. No cough or fever. No shortness of breath, nausea, vomiting, abdominal pain. She is never had this kind of pain before. Denies any history of acid reflux or ulcers. No cardiac history. No missed dialysis sessions. She is due for dialysis in the morning. She is Tuesday, Thursday and Saturday.  The history is provided by the EMS personnel and the patient.  Chest Pain Associated symptoms: shortness of breath   Associated symptoms: no abdominal pain, no dizziness, no fever, no headache, no nausea, no vomiting and no weakness        Past Medical History:  Diagnosis Date  . Anemia associated with chronic renal failure   . Blood transfusion   . 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  . Depression 05/14/2015  . Dialysis care   . ESRD (end stage renal disease) on dialysis ([place])   . GERD (gastroesophageal reflux disease) 01/07/2017  . HTN (hypertension) 05/02/2011  . Spina bifida   . UTI (lower urinary tract infection)     Patient Active Problem List   Diagnosis Date Noted  . Acute UTI 05/07/2018  . Tobacco use 05/07/2018  . Hypokalemia 05/07/2018  . GERD (gastroesophageal reflux disease) 01/07/2017  . Nausea with vomiting 01/07/2017  . Pyelonephritis 07/31/2015  . Back pain 07/31/2015  . Abdominal pain 07/31/2015  . Constipation 07/31/2015  . Major depressive  disorder, single episode, severe without psychotic features ([place]) 05/15/2015  . Spina bifida ([place]) 01/18/2012  . ESRD (end stage renal disease) on dialysis ([place]) 05/02/2011  . Anemia 05/02/2011  . HTN (hypertension) 05/02/2011    Past Surgical History:  Procedure Laterality Date  . AV FISTULA PLACEMENT     left arm     OB History    Gravida  0   Para  0   Term  0   Preterm  0   AB  0   Living  0     SAB  0   TAB  0   Ectopic  0   Multiple  0   Live Births  0           Family History  Problem Relation Age of Onset  . Kidney cancer Other   . Hypertension Maternal Grandmother   . Arthritis Maternal Grandmother   . Breast cancer Maternal Aunt   . Colon cancer Neg Hx     Social History   Tobacco Use  . Smoking status: Former Smoker    Packs/day: 0.10    Types: Cigarettes  . Smokeless tobacco: Never Used  . Tobacco comment: 2 cigs a day  Vaping Use  . Vaping Use: Never used  Substance Use Topics  . Alcohol use: No  . Drug use: No    Home Medications Prior to Admission medications   Medication Sig Start Date  End Date Taking? Authorizing Provider  acetaminophen (TYLENOL) 500 MG tablet Take 1,000 mg by mouth every 6 (six) hours as needed for mild pain or headache.    [provider]  amLODipine (NORVASC) 10 MG tablet SMARTSIG:1 Tablet(s) By Mouth Every Evening 11/24/19   [provider]  AURYXIA 1 GM 210 MG(Fe) tablet Take 2 tablets by mouth 3 (three) times daily with meals. 04/15/18   [provider]  diphenhydrAMINE (BENADRYL) 25 mg capsule Take 25 mg by mouth daily as needed for allergies.    [provider]  doxycycline (VIBRAMYCIN) 100 MG capsule Take 1 capsule (100 mg total) by mouth 2 (two) times daily. 03/13/20   Evalee Jefferson, PA-C  Epoetin Alfa (EPOGEN IJ) Inject as directed See admin instructions. Every Tuesday, Thursday, Saturday    [provider]  metoCLOPramide (REGLAN) 5 MG tablet Take 1 tablet  (5 mg total) by mouth 4 (four) times daily. Take one tablet 1-2 hours before dialysis 11/02/19   Doran Stabler, MD  metoprolol succinate (TOPROL-XL) 50 MG 24 hr tablet Take 50 mg by mouth daily. 12/29/19   [provider]  multivitamin (RENA-VIT) TABS tablet Take 1 tablet by mouth See admin instructions. Every Tuesday, Thursday, Saturday    [provider]  omeprazole (PRILOSEC) 20 MG capsule Take 20 mg by mouth daily.  03/26/16   [provider]  predniSONE (DELTASONE) 50 MG tablet Take one tablet daily for 4 days. 03/14/20   Evalee Jefferson, PA-C  VELPHORO 500 MG chewable tablet Chew 500 mg by mouth 3 (three) times daily. 11/17/19   [provider]    Allergies    Ciprofloxacin; Other; Peanut-containing drug products; Aleve [naproxen sodium]; Ceftriaxone; Coconut oil; Influenza vaccines; Tetanus toxoid, adsorbed; Tetanus toxoids; and Latex  Review of Systems   Review of Systems  Constitutional: Negative for activity change, appetite change and fever.  HENT: Negative for congestion and rhinorrhea.   Eyes: Negative for visual disturbance.  Respiratory: Positive for chest tightness and shortness of breath.   Cardiovascular: Positive for chest pain.  Gastrointestinal: Negative for abdominal pain, nausea and vomiting.  Genitourinary: Negative for dysuria and urgency.  Musculoskeletal: Negative for arthralgias and myalgias.  Skin: Negative for rash.  Neurological: Negative for dizziness, weakness and headaches.   all other systems are negative except as noted in the HPI and PMH.    Physical Exam Updated Vital Signs BP (!) 159/97   Pulse 91   Temp 98.6 F (37 C) (Oral)   Resp (!) 29   Ht 2' (0.61 m)   Wt 26.3 kg   SpO2 94%   BMI 70.77 kg/m   Physical Exam Vitals and nursing note reviewed.  Constitutional:      General: She is not in acute distress.    Appearance: She is well-developed.  HENT:     Head: Normocephalic and atraumatic.      Mouth/Throat:     Pharynx: No oropharyngeal exudate.  Eyes:     Conjunctiva/sclera: Conjunctivae normal.     Pupils: Pupils are equal, round, and reactive to light.  Neck:     Comments: No meningismus. Cardiovascular:     Rate and Rhythm: Normal rate and regular rhythm.     Heart sounds: Normal heart sounds. No murmur heard.   Pulmonary:     Effort: Pulmonary effort is normal. No respiratory distress.     Breath sounds: Normal breath sounds.     Comments: Reproducible central chest tenderness Chest:  Chest wall: Tenderness present.  Abdominal:     Palpations: Abdomen is soft.     Tenderness: There is no abdominal tenderness. There is no guarding or rebound.  Musculoskeletal:        General: No tenderness. Normal range of motion.     Cervical back: Normal range of motion and neck supple.     Comments: LUE AVG with thrill  Atrophic lower extremities  Skin:    General: Skin is warm.     Findings: No rash.  Neurological:     Mental Status: She is alert and oriented to person, place, and time.     Cranial Nerves: No cranial nerve deficit.     Motor: No abnormal muscle tone.     Coordination: Coordination normal.     Comments: No ataxia on finger to nose bilaterally. No pronator drift. 5/5 strength throughout. CN 2-12 intact.Equal grip strength. Sensation intact.   Psychiatric:        Behavior: Behavior normal.     ED Results / Procedures / Treatments   Labs (all labs ordered are listed, but only abnormal results are displayed) Labs Reviewed  CBC WITH DIFFERENTIAL/PLATELET - Abnormal; Notable for the following components:      Result Value   RBC 3.62 (*)    Hemoglobin 9.8 (*)    HCT 33.1 (*)    MCHC 29.6 (*)    RDW 22.0 (*)    All other components within normal limits  BASIC METABOLIC PANEL - Abnormal; Notable for the following components:   Glucose, Bld 112 (*)    BUN 48 (*)    Creatinine, Ser 7.18 (*)    Calcium 8.6 (*)    GFR calc non Af Amer 7 (*)    GFR  calc Af Amer 8 (*)    Anion gap 16 (*)    All other components within normal limits  D-DIMER, QUANTITATIVE (NOT AT [facility]) - Abnormal; Notable for the following components:   D-Dimer, Quant 0.75 (*)    All other components within normal limits  HEPATIC FUNCTION PANEL  LIPASE, BLOOD  TROPONIN I (HIGH SENSITIVITY)  TROPONIN I (HIGH SENSITIVITY)    EKG EKG Interpretation  Date/Time:  Saturday March 31 2020 00:35:59 EDT Ventricular Rate:  94 PR Interval:    QRS Duration: 74 QT Interval:  357 QTC Calculation: 447 R Axis:   41 Text Interpretation: Sinus rhythm Borderline short PR interval No significant change was found Confirmed by Ezequiel Essex [phone]) on 03/31/2020 1:03:35 AM   Radiology CT Angio Chest PE W and/or Wo Contrast  Result Date: 03/31/2020 CLINICAL DATA:  Chest pain and PVCs EXAM: CT ANGIOGRAPHY CHEST WITH CONTRAST TECHNIQUE: Multidetector CT imaging of the chest was performed using the standard protocol during bolus administration of intravenous contrast. Multiplanar CT image reconstructions and MIPs were obtained to evaluate the vascular anatomy. CONTRAST:  160mL OMNIPAQUE IOHEXOL 350 MG/ML SOLN COMPARISON:  September 20, 2019 FINDINGS: Cardiovascular: There is a optimal opacification of the pulmonary arteries. There is no central,segmental, or subsegmental filling defects within the pulmonary arteries. The heart is normal in size. No pericardial effusion or thickening. No evidence right heart strain. There is normal three-vessel brachiocephalic anatomy without proximal stenosis. The thoracic aorta is normal in appearance. Mediastinum/Nodes: No hilar, mediastinal, or axillary adenopathy. Thyroid gland, trachea, and esophagus demonstrate no significant findings. Lungs/Pleura: There is a small right and trace left pleural effusion. Ground-glass opacities are seen throughout both lungs, predominantly within the lower lungs. Interlobular septal thickening  seen predominantly at both  lung bases. Upper Abdomen: No acute abnormalities present in the visualized portions of the upper abdomen. Musculoskeletal: No chest wall abnormality. No acute or significant osseous findings. Review of the MIP images confirms the above findings. IMPRESSION: No central, segmental, or subsegmental pulmonary embolism. Findings suggestive of pulmonary edema with a small right and trace left pleural effusion. Electronically Signed   By: Prudencio Pair M.D.   On: 03/31/2020 04:03   DG Chest Portable 1 View  Result Date: 03/31/2020 CLINICAL DATA:  Chest pain EXAM: PORTABLE CHEST 1 VIEW COMPARISON:  03/13/2020 FINDINGS: Mild cardiomegaly with pulmonary vascular congestion. No pleural effusion or pneumothorax. No focal airspace consolidation. IMPRESSION: Mild cardiomegaly and pulmonary vascular congestion. Electronically Signed   By: Ulyses Jarred M.D.   On: 03/31/2020 01:41    Procedures Procedures (including critical care time)  Medications Ordered in ED Medications - No data to display  ED Course  I have reviewed the triage vital signs and the nursing notes.  Pertinent labs & imaging results that were available during my care of the patient were reviewed by me and considered in my medical decision making (see chart for details).    MDM Rules/Calculators/A&P                         Dialysis patient with central chest pain intermittent since approximately 8 PM. EKG without acute ischemia. Does have peaked T waves anteriorly. This is similar to previous however.  Potassium is normal at 4.0. Troponin is negative. EKG shows mild congestion but no hypoxia or increased work of breathing. She is due for dialysis in the morning.  CT is negative for pulmonary embolism.  Troponin negative x2.  Blood pressure is high at 210/113.  Patient states she gets this way before dialysis will not take her medication until after dialysis.  She also states she needs to self catheterize and this will help her blood  pressure.  She is reassured that her chest pain is atypical for ACS.  No evidence of pulmonary embolism.  Suspect GI etiology of the pain versus musculoskeletal.  She does not want to wait for her LFTs and lipase.  She is anxious to get to dialysis this morning.  She states she will take her blood pressure medication after dialysis.  No chest pain or hypoxia currently.  Advised we will call her if her LFTs or lipase are significantly elevated.  She is to go to dialysis directly from the ED. Return to the ED for chest pain becomes exertional, associated shortness of breath, nausea, vomiting, sweating, any other concerns. Final Clinical Impression(s) / ED Diagnoses Final diagnoses:  Atypical chest pain    Rx / DC Orders ED Discharge Orders    None       Isiaih Hollenbach, Annie Main, MD 03/31/20 [phone]

## 2020-03-31 NOTE — Discharge Instructions (Signed)
There is no evidence of heart attack or blood clot in the lung.  Take your medications as prescribed and go to dialysis this morning.  Return immediately if your chest pain becomes exertional, associated shortness of breath, nausea, vomiting, fever or any other concerns.

## 2020-03-31 NOTE — ED Notes (Signed)
EDP made aware that bp 210/113. Pt states bp gets high before dialysis.

## 2020-03-31 NOTE — ED Notes (Signed)
Pt provided with in and out cath kit for self cath.

## 2020-04-03 DIAGNOSIS — R300 Dysuria: Secondary | ICD-10-CM | POA: Diagnosis not present

## 2020-04-03 DIAGNOSIS — N186 End stage renal disease: Secondary | ICD-10-CM | POA: Diagnosis not present

## 2020-04-03 DIAGNOSIS — Z992 Dependence on renal dialysis: Secondary | ICD-10-CM | POA: Diagnosis not present

## 2020-04-03 DIAGNOSIS — E871 Hypo-osmolality and hyponatremia: Secondary | ICD-10-CM | POA: Diagnosis not present

## 2020-04-03 DIAGNOSIS — N2581 Secondary hyperparathyroidism of renal origin: Secondary | ICD-10-CM | POA: Diagnosis not present

## 2020-04-05 DIAGNOSIS — N2581 Secondary hyperparathyroidism of renal origin: Secondary | ICD-10-CM | POA: Diagnosis not present

## 2020-04-05 DIAGNOSIS — N186 End stage renal disease: Secondary | ICD-10-CM | POA: Diagnosis not present

## 2020-04-05 DIAGNOSIS — E871 Hypo-osmolality and hyponatremia: Secondary | ICD-10-CM | POA: Diagnosis not present

## 2020-04-05 DIAGNOSIS — R300 Dysuria: Secondary | ICD-10-CM | POA: Diagnosis not present

## 2020-04-05 DIAGNOSIS — Z992 Dependence on renal dialysis: Secondary | ICD-10-CM | POA: Diagnosis not present

## 2020-04-07 DIAGNOSIS — E871 Hypo-osmolality and hyponatremia: Secondary | ICD-10-CM | POA: Diagnosis not present

## 2020-04-07 DIAGNOSIS — Z992 Dependence on renal dialysis: Secondary | ICD-10-CM | POA: Diagnosis not present

## 2020-04-07 DIAGNOSIS — R300 Dysuria: Secondary | ICD-10-CM | POA: Diagnosis not present

## 2020-04-07 DIAGNOSIS — N186 End stage renal disease: Secondary | ICD-10-CM | POA: Diagnosis not present

## 2020-04-07 DIAGNOSIS — N2581 Secondary hyperparathyroidism of renal origin: Secondary | ICD-10-CM | POA: Diagnosis not present

## 2020-04-10 DIAGNOSIS — N186 End stage renal disease: Secondary | ICD-10-CM | POA: Diagnosis not present

## 2020-04-10 DIAGNOSIS — E871 Hypo-osmolality and hyponatremia: Secondary | ICD-10-CM | POA: Diagnosis not present

## 2020-04-10 DIAGNOSIS — Z992 Dependence on renal dialysis: Secondary | ICD-10-CM | POA: Diagnosis not present

## 2020-04-10 DIAGNOSIS — N2581 Secondary hyperparathyroidism of renal origin: Secondary | ICD-10-CM | POA: Diagnosis not present

## 2020-04-10 DIAGNOSIS — R300 Dysuria: Secondary | ICD-10-CM | POA: Diagnosis not present

## 2020-04-12 DIAGNOSIS — N186 End stage renal disease: Secondary | ICD-10-CM | POA: Diagnosis not present

## 2020-04-12 DIAGNOSIS — R300 Dysuria: Secondary | ICD-10-CM | POA: Diagnosis not present

## 2020-04-12 DIAGNOSIS — Z992 Dependence on renal dialysis: Secondary | ICD-10-CM | POA: Diagnosis not present

## 2020-04-12 DIAGNOSIS — N2581 Secondary hyperparathyroidism of renal origin: Secondary | ICD-10-CM | POA: Diagnosis not present

## 2020-04-12 DIAGNOSIS — E871 Hypo-osmolality and hyponatremia: Secondary | ICD-10-CM | POA: Diagnosis not present

## 2020-04-14 DIAGNOSIS — R300 Dysuria: Secondary | ICD-10-CM | POA: Diagnosis not present

## 2020-04-14 DIAGNOSIS — E871 Hypo-osmolality and hyponatremia: Secondary | ICD-10-CM | POA: Diagnosis not present

## 2020-04-14 DIAGNOSIS — Z992 Dependence on renal dialysis: Secondary | ICD-10-CM | POA: Diagnosis not present

## 2020-04-14 DIAGNOSIS — N186 End stage renal disease: Secondary | ICD-10-CM | POA: Diagnosis not present

## 2020-04-14 DIAGNOSIS — N2581 Secondary hyperparathyroidism of renal origin: Secondary | ICD-10-CM | POA: Diagnosis not present

## 2020-04-17 DIAGNOSIS — Z992 Dependence on renal dialysis: Secondary | ICD-10-CM | POA: Diagnosis not present

## 2020-04-17 DIAGNOSIS — R300 Dysuria: Secondary | ICD-10-CM | POA: Diagnosis not present

## 2020-04-17 DIAGNOSIS — N186 End stage renal disease: Secondary | ICD-10-CM | POA: Diagnosis not present

## 2020-04-17 DIAGNOSIS — E871 Hypo-osmolality and hyponatremia: Secondary | ICD-10-CM | POA: Diagnosis not present

## 2020-04-17 DIAGNOSIS — N2581 Secondary hyperparathyroidism of renal origin: Secondary | ICD-10-CM | POA: Diagnosis not present

## 2020-04-19 DIAGNOSIS — N186 End stage renal disease: Secondary | ICD-10-CM | POA: Diagnosis not present

## 2020-04-19 DIAGNOSIS — R300 Dysuria: Secondary | ICD-10-CM | POA: Diagnosis not present

## 2020-04-19 DIAGNOSIS — E871 Hypo-osmolality and hyponatremia: Secondary | ICD-10-CM | POA: Diagnosis not present

## 2020-04-19 DIAGNOSIS — N2581 Secondary hyperparathyroidism of renal origin: Secondary | ICD-10-CM | POA: Diagnosis not present

## 2020-04-19 DIAGNOSIS — Z992 Dependence on renal dialysis: Secondary | ICD-10-CM | POA: Diagnosis not present

## 2020-04-21 DIAGNOSIS — N186 End stage renal disease: Secondary | ICD-10-CM | POA: Diagnosis not present

## 2020-04-21 DIAGNOSIS — N2581 Secondary hyperparathyroidism of renal origin: Secondary | ICD-10-CM | POA: Diagnosis not present

## 2020-04-21 DIAGNOSIS — R300 Dysuria: Secondary | ICD-10-CM | POA: Diagnosis not present

## 2020-04-21 DIAGNOSIS — Z992 Dependence on renal dialysis: Secondary | ICD-10-CM | POA: Diagnosis not present

## 2020-04-21 DIAGNOSIS — E871 Hypo-osmolality and hyponatremia: Secondary | ICD-10-CM | POA: Diagnosis not present

## 2020-04-24 DIAGNOSIS — E871 Hypo-osmolality and hyponatremia: Secondary | ICD-10-CM | POA: Diagnosis not present

## 2020-04-24 DIAGNOSIS — R300 Dysuria: Secondary | ICD-10-CM | POA: Diagnosis not present

## 2020-04-24 DIAGNOSIS — N2581 Secondary hyperparathyroidism of renal origin: Secondary | ICD-10-CM | POA: Diagnosis not present

## 2020-04-24 DIAGNOSIS — N186 End stage renal disease: Secondary | ICD-10-CM | POA: Diagnosis not present

## 2020-04-24 DIAGNOSIS — Z992 Dependence on renal dialysis: Secondary | ICD-10-CM | POA: Diagnosis not present

## 2020-04-26 DIAGNOSIS — E871 Hypo-osmolality and hyponatremia: Secondary | ICD-10-CM | POA: Diagnosis not present

## 2020-04-26 DIAGNOSIS — N186 End stage renal disease: Secondary | ICD-10-CM | POA: Diagnosis not present

## 2020-04-26 DIAGNOSIS — N2581 Secondary hyperparathyroidism of renal origin: Secondary | ICD-10-CM | POA: Diagnosis not present

## 2020-04-26 DIAGNOSIS — Z992 Dependence on renal dialysis: Secondary | ICD-10-CM | POA: Diagnosis not present

## 2020-04-26 DIAGNOSIS — R300 Dysuria: Secondary | ICD-10-CM | POA: Diagnosis not present

## 2020-04-27 DIAGNOSIS — Z992 Dependence on renal dialysis: Secondary | ICD-10-CM | POA: Diagnosis not present

## 2020-04-27 DIAGNOSIS — D631 Anemia in chronic kidney disease: Secondary | ICD-10-CM | POA: Diagnosis not present

## 2020-04-27 DIAGNOSIS — J399 Disease of upper respiratory tract, unspecified: Secondary | ICD-10-CM | POA: Diagnosis not present

## 2020-04-27 DIAGNOSIS — N2581 Secondary hyperparathyroidism of renal origin: Secondary | ICD-10-CM | POA: Diagnosis not present

## 2020-04-27 DIAGNOSIS — E8770 Fluid overload, unspecified: Secondary | ICD-10-CM | POA: Diagnosis not present

## 2020-04-27 DIAGNOSIS — I120 Hypertensive chronic kidney disease with stage 5 chronic kidney disease or end stage renal disease: Secondary | ICD-10-CM | POA: Diagnosis not present

## 2020-04-27 DIAGNOSIS — N186 End stage renal disease: Secondary | ICD-10-CM | POA: Diagnosis not present

## 2020-04-27 DIAGNOSIS — R52 Pain, unspecified: Secondary | ICD-10-CM | POA: Diagnosis not present

## 2020-04-27 DIAGNOSIS — I10 Essential (primary) hypertension: Secondary | ICD-10-CM | POA: Diagnosis not present

## 2020-04-28 DIAGNOSIS — Z992 Dependence on renal dialysis: Secondary | ICD-10-CM | POA: Diagnosis not present

## 2020-04-28 DIAGNOSIS — N186 End stage renal disease: Secondary | ICD-10-CM | POA: Diagnosis not present

## 2020-04-28 DIAGNOSIS — N2581 Secondary hyperparathyroidism of renal origin: Secondary | ICD-10-CM | POA: Diagnosis not present

## 2020-04-28 DIAGNOSIS — D631 Anemia in chronic kidney disease: Secondary | ICD-10-CM | POA: Diagnosis not present

## 2020-04-28 DIAGNOSIS — R52 Pain, unspecified: Secondary | ICD-10-CM | POA: Diagnosis not present

## 2020-04-29 ENCOUNTER — Other Ambulatory Visit: Payer: Self-pay

## 2020-04-29 ENCOUNTER — Encounter (HOSPITAL_COMMUNITY): Payer: Self-pay | Admitting: *Deleted

## 2020-04-29 ENCOUNTER — Observation Stay (HOSPITAL_COMMUNITY)
Admission: EM | Admit: 2020-04-29 | Discharge: 2020-04-30 | Disposition: A | Discharge status: Home / Self Care | Payer: Medicare Other | Attending: Family Medicine | Admitting: Family Medicine

## 2020-04-29 ENCOUNTER — Emergency Department (HOSPITAL_COMMUNITY): Payer: Medicare Other

## 2020-04-29 DIAGNOSIS — D631 Anemia in chronic kidney disease: Secondary | ICD-10-CM | POA: Diagnosis not present

## 2020-04-29 DIAGNOSIS — Z20822 Contact with and (suspected) exposure to covid-19: Secondary | ICD-10-CM | POA: Insufficient documentation

## 2020-04-29 DIAGNOSIS — N3001 Acute cystitis with hematuria: Secondary | ICD-10-CM

## 2020-04-29 DIAGNOSIS — N390 Urinary tract infection, site not specified: Secondary | ICD-10-CM | POA: Diagnosis not present

## 2020-04-29 DIAGNOSIS — R519 Headache, unspecified: Secondary | ICD-10-CM

## 2020-04-29 DIAGNOSIS — Z91040 Latex allergy status: Secondary | ICD-10-CM | POA: Insufficient documentation

## 2020-04-29 DIAGNOSIS — Z79899 Other long term (current) drug therapy: Secondary | ICD-10-CM | POA: Insufficient documentation

## 2020-04-29 DIAGNOSIS — Z87891 Personal history of nicotine dependence: Secondary | ICD-10-CM | POA: Insufficient documentation

## 2020-04-29 DIAGNOSIS — Z992 Dependence on renal dialysis: Secondary | ICD-10-CM | POA: Insufficient documentation

## 2020-04-29 DIAGNOSIS — I120 Hypertensive chronic kidney disease with stage 5 chronic kidney disease or end stage renal disease: Secondary | ICD-10-CM | POA: Insufficient documentation

## 2020-04-29 DIAGNOSIS — N186 End stage renal disease: Secondary | ICD-10-CM | POA: Diagnosis not present

## 2020-04-29 DIAGNOSIS — N189 Chronic kidney disease, unspecified: Secondary | ICD-10-CM

## 2020-04-29 LAB — CBC WITH DIFFERENTIAL/PLATELET
Abs Immature Granulocytes: 0.03 10*3/uL (ref 0.00–0.07)
Basophils Absolute: 0 10*3/uL (ref 0.0–0.1)
Basophils Relative: 0 %
Eosinophils Absolute: 0.1 10*3/uL (ref 0.0–0.5)
Eosinophils Relative: 2 %
HCT: 37.5 % (ref 36.0–46.0)
Hemoglobin: 11.2 g/dL — ABNORMAL LOW (ref 12.0–15.0)
Immature Granulocytes: 1 %
Lymphocytes Relative: 22 %
Lymphs Abs: 1.4 10*3/uL (ref 0.7–4.0)
MCH: 27.7 pg (ref 26.0–34.0)
MCHC: 29.9 g/dL — ABNORMAL LOW (ref 30.0–36.0)
MCV: 92.6 fL (ref 80.0–100.0)
Monocytes Absolute: 0.6 10*3/uL (ref 0.1–1.0)
Monocytes Relative: 9 %
Neutro Abs: 4.1 10*3/uL (ref 1.7–7.7)
Neutrophils Relative %: 66 %
Platelets: 267 10*3/uL (ref 150–400)
RBC: 4.05 MIL/uL (ref 3.87–5.11)
RDW: 19.2 % — ABNORMAL HIGH (ref 11.5–15.5)
WBC: 6.2 10*3/uL (ref 4.0–10.5)
nRBC: 0.5 % — ABNORMAL HIGH (ref 0.0–0.2)

## 2020-04-29 LAB — URINALYSIS, ROUTINE W REFLEX MICROSCOPIC
Bilirubin Urine: NEGATIVE
Glucose, UA: 50 mg/dL — AB
Ketones, ur: NEGATIVE mg/dL
Nitrite: NEGATIVE
Protein, ur: >=300 mg/dL — AB
RBC / HPF: >50 RBC/hpf — ABNORMAL HIGH (ref 0–5)
Specific Gravity, Urine: 1.008 (ref 1.005–1.030)
pH: 9 — ABNORMAL HIGH (ref 5.0–8.0)

## 2020-04-29 LAB — BASIC METABOLIC PANEL
Anion gap: 17 — ABNORMAL HIGH (ref 5–15)
BUN: 38 mg/dL — ABNORMAL HIGH (ref 6–20)
CO2: 30 mmol/L (ref 22–32)
Calcium: 9.8 mg/dL (ref 8.9–10.3)
Chloride: 96 mmol/L — ABNORMAL LOW (ref 98–111)
Creatinine, Ser: 6.17 mg/dL — ABNORMAL HIGH (ref 0.44–1.00)
GFR calc Af Amer: 10 mL/min — ABNORMAL LOW (ref >60)
GFR calc non Af Amer: 9 mL/min — ABNORMAL LOW (ref >60)
Glucose, Bld: 94 mg/dL (ref 70–99)
Potassium: 3.2 mmol/L — ABNORMAL LOW (ref 3.5–5.1)
Sodium: 143 mmol/L (ref 135–145)

## 2020-04-29 MED ORDER — AMOXICILLIN-POT CLAVULANATE 875-125 MG PO TABS: 1.0000 | ORAL_TABLET | Freq: Once | ORAL | Status: DC

## 2020-04-29 MED ORDER — DIPHENHYDRAMINE HCL 50 MG/ML IJ SOLN
12.5000 mg | Freq: Once | INTRAMUSCULAR | Status: AC
  Administered 2020-04-29: 12.5 mg via INTRAVENOUS
  Filled 2020-04-29: qty 1

## 2020-04-29 MED ORDER — ACETAMINOPHEN 325 MG PO TABS
650.0000 mg | ORAL_TABLET | Freq: Once | ORAL | Status: AC
  Administered 2020-04-29: 650 mg via ORAL
  Filled 2020-04-29: qty 2

## 2020-04-29 MED ORDER — AMOXICILLIN-POT CLAVULANATE 250-125 MG PO TABS
1.0000 | ORAL_TABLET | Freq: Once | ORAL | Status: AC
  Administered 2020-04-29: 1 via ORAL
  Filled 2020-04-29: qty 1

## 2020-04-29 MED ORDER — PROCHLORPERAZINE EDISYLATE 10 MG/2ML IJ SOLN
5.0000 mg | Freq: Once | INTRAMUSCULAR | Status: AC
  Administered 2020-04-29: 5 mg via INTRAVENOUS
  Filled 2020-04-29: qty 2

## 2020-04-29 MED ORDER — KETOROLAC TROMETHAMINE 30 MG/ML IJ SOLN
15.0000 mg | Freq: Once | INTRAMUSCULAR | Status: AC
  Administered 2020-04-29: 15 mg via INTRAVENOUS
  Filled 2020-04-29: qty 1

## 2020-04-29 MED ORDER — SODIUM CHLORIDE 0.9 % IV SOLN: INTRAVENOUS | Status: DC

## 2020-04-29 NOTE — ED Triage Notes (Signed)
Pt with headache for past 3 days, since last night pt with N/V.

## 2020-04-29 NOTE — ED Notes (Signed)
Patient transported to CT 

## 2020-04-29 NOTE — ED Provider Notes (Signed)
[facility] EMERGENCY DEPARTMENT Provider Note   CSN: [id number] Arrival date & time: 04/29/20  1623     History Chief Complaint  Patient presents with  . Headache    [name] is a 29 y.o. female.  HPI    Pt is a 29 y/o female - hx of spina bifida - ESRD on dialysis for 10 years - L arm fistula - she has recurrent UTI's and still does self cath at home as needed - she reports having a headache that started approximately 3 days ago - gradual and mild in onset - gradually worsening and now associated with n/v and photosensitivity.  She has never had a headache like in the past, there is also some neck pain with it but she does not feel stiff and has not had fevers.  No diarrhea.  She has no abdominal pain chest pain or any shortness of breath.  She has not had any recent exposures to sick people.  She gets dialysis Tuesdays Thursdays and Saturdays and recently dialyzed yesterday.  She has had Tylenol at home without relief.  She reports that her usual headache for her will be in the frontal area, this 1 here is in the frontal area but also in the back of the neck  Past Medical History:  Diagnosis Date  . Anemia associated with chronic renal failure   . Blood transfusion   . 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  . Depression 05/14/2015  . Dialysis care   . ESRD (end stage renal disease) on dialysis ([place])   . GERD (gastroesophageal reflux disease) 01/07/2017  . HTN (hypertension) 05/02/2011  . Spina bifida   . UTI (lower urinary tract infection)     Patient Active Problem List   Diagnosis Date Noted  . Acute UTI 05/07/2018  . Tobacco use 05/07/2018  . Hypokalemia 05/07/2018  . GERD (gastroesophageal reflux disease) 01/07/2017  . Nausea with vomiting 01/07/2017  . Pyelonephritis 07/31/2015  . Back pain 07/31/2015  . Abdominal pain 07/31/2015  . Constipation 07/31/2015  . Major depressive disorder, single episode, severe without psychotic features  ([place]) 05/15/2015  . Spina bifida ([place]) 01/18/2012  . ESRD (end stage renal disease) on dialysis ([place]) 05/02/2011  . Anemia 05/02/2011  . HTN (hypertension) 05/02/2011    Past Surgical History:  Procedure Laterality Date  . AV FISTULA PLACEMENT     left arm     OB History    Gravida  0   Para  0   Term  0   Preterm  0   AB  0   Living  0     SAB  0   TAB  0   Ectopic  0   Multiple  0   Live Births  0           Family History  Problem Relation Age of Onset  . Kidney cancer Other   . Hypertension Maternal Grandmother   . Arthritis Maternal Grandmother   . Breast cancer Maternal Aunt   . Colon cancer Neg Hx     Social History   Tobacco Use  . Smoking status: Former Smoker    Packs/day: 0.10    Types: Cigarettes  . Smokeless tobacco: Never Used  . Tobacco comment: 2 cigs a day  Vaping Use  . Vaping Use: Never used  Substance Use Topics  . Alcohol use: No  . Drug use: No    Home Medications Prior to  Admission medications   Medication Sig Start Date End Date Taking? Authorizing Provider  acetaminophen (TYLENOL) 500 MG tablet Take 1,000 mg by mouth every 6 (six) hours as needed for mild pain or headache.    [provider]  amLODipine (NORVASC) 10 MG tablet SMARTSIG:1 Tablet(s) By Mouth Every Evening 11/24/19   [provider]  AURYXIA 1 GM 210 MG(Fe) tablet Take 2 tablets by mouth 3 (three) times daily with meals. 04/15/18   [provider]  diphenhydrAMINE (BENADRYL) 25 mg capsule Take 25 mg by mouth daily as needed for allergies.    [provider]  doxycycline (VIBRAMYCIN) 100 MG capsule Take 1 capsule (100 mg total) by mouth 2 (two) times daily. 03/13/20   Evalee Jefferson, PA-C  Epoetin Alfa (EPOGEN IJ) Inject as directed See admin instructions. Every Tuesday, Thursday, Saturday    [provider]  metoCLOPramide (REGLAN) 5 MG tablet Take 1 tablet (5 mg total) by mouth 4 (four) times daily. Take one tablet  1-2 hours before dialysis 11/02/19   Doran Stabler, MD  metoprolol succinate (TOPROL-XL) 50 MG 24 hr tablet Take 50 mg by mouth daily. 12/29/19   [provider]  multivitamin (RENA-VIT) TABS tablet Take 1 tablet by mouth See admin instructions. Every Tuesday, Thursday, Saturday    [provider]  omeprazole (PRILOSEC) 20 MG capsule Take 20 mg by mouth daily.  03/26/16   [provider]  predniSONE (DELTASONE) 50 MG tablet Take one tablet daily for 4 days. 03/14/20   Evalee Jefferson, PA-C  VELPHORO 500 MG chewable tablet Chew 500 mg by mouth 3 (three) times daily. 11/17/19   [provider]    Allergies    Ciprofloxacin; Other; Peanut-containing drug products; Aleve [naproxen sodium]; Ceftriaxone; Coconut oil; Influenza vaccines; Tetanus toxoid, adsorbed; Tetanus toxoids; and Latex  Review of Systems   Review of Systems  All other systems reviewed and are negative.   Physical Exam Updated Vital Signs BP (!) 158/97   Pulse (!) 120   Temp 99 F (37.2 C) (Oral)   Resp 16   Wt 29.5 kg   SpO2 100%   BMI 79.34 kg/m   Physical Exam Vitals and nursing note reviewed.  Constitutional:      General: She is not in acute distress.    Appearance: She is well-developed.  HENT:     Head: Normocephalic and atraumatic.     Comments: No signs of infection in the mouth or sinuses or about the face / head.    Mouth/Throat:     Pharynx: No oropharyngeal exudate.  Eyes:     General: No scleral icterus.       Right eye: No discharge.        Left eye: No discharge.     Conjunctiva/sclera: Conjunctivae normal.     Pupils: Pupils are equal, round, and reactive to light.  Neck:     Thyroid: No thyromegaly.     Vascular: No JVD.     Meningeal: Brudzinski's sign and Kernig's sign absent.  Cardiovascular:     Rate and Rhythm: Regular rhythm. Tachycardia present.     Heart sounds: Normal heart sounds. No murmur heard.  No friction rub. No gallop.      Comments:  Tachy to 110 Pulmonary:     Effort: Pulmonary effort is normal. No respiratory distress.     Breath sounds: Normal breath sounds. No wheezing or rales.  Abdominal:     General: Bowel sounds are normal.  There is no distension.     Palpations: Abdomen is soft. There is no mass.     Tenderness: There is no abdominal tenderness.  Musculoskeletal:        General: No tenderness. Normal range of motion.     Cervical back: Normal range of motion and neck supple. No rigidity.  Lymphadenopathy:     Cervical: No cervical adenopathy.  Skin:    General: Skin is warm and dry.     Findings: No erythema or rash.  Neurological:     Mental Status: She is alert.     Coordination: Coordination normal.     Comments: Clear speech, normal level of alertness, able to move her arms without any difficulty and follow commands without any difficulty.  Psychiatric:        Behavior: Behavior normal.     ED Results / Procedures / Treatments   Labs (all labs ordered are listed, but only abnormal results are displayed) Labs Reviewed  CBC WITH DIFFERENTIAL/PLATELET - Abnormal; Notable for the following components:      Result Value   Hemoglobin 11.2 (*)    MCHC 29.9 (*)    RDW 19.2 (*)    nRBC 0.5 (*)    All other components within normal limits  BASIC METABOLIC PANEL - Abnormal; Notable for the following components:   Potassium 3.2 (*)    Chloride 96 (*)    BUN 38 (*)    Creatinine, Ser 6.17 (*)    GFR calc non Af Amer 9 (*)    GFR calc Af Amer 10 (*)    Anion gap 17 (*)    All other components within normal limits  URINALYSIS, ROUTINE W REFLEX MICROSCOPIC - Abnormal; Notable for the following components:   APPearance CLOUDY (*)    pH 9.0 (*)    Glucose, UA 50 (*)    Hgb urine dipstick SMALL (*)    Protein, ur >=300 (*)    Leukocytes,Ua LARGE (*)    RBC / HPF >50 (*)    Bacteria, UA RARE (*)    All other components within normal limits  URINE CULTURE    EKG None  Radiology CT Head Wo  Contrast  Result Date: 04/29/2020 CLINICAL DATA:  Headache for 3 days with nausea and vomiting EXAM: CT HEAD WITHOUT CONTRAST TECHNIQUE: Contiguous axial images were obtained from the base of the skull through the vertex without intravenous contrast. COMPARISON:  05/07/2018 FINDINGS: Brain: There is no mass, hemorrhage or extra-axial collection. The size and configuration of the ventricles and extra-axial CSF spaces are normal. The brain parenchyma is normal, without acute or chronic infarction. Vascular: No abnormal hyperdensity of the major intracranial arteries or dural venous sinuses. No intracranial atherosclerosis. Skull: The visualized skull base, calvarium and extracranial soft tissues are normal. Sinuses/Orbits: No fluid levels or advanced mucosal thickening of the visualized paranasal sinuses. No mastoid or middle ear effusion. The orbits are normal. IMPRESSION: Normal head CT. Electronically Signed   By: Ulyses Jarred M.D.   On: 04/29/2020 22:00    Procedures Procedures (including critical care time)  Medications Ordered in ED Medications  0.9 %  sodium chloride infusion ( Intravenous New Bag/Given 04/29/20 2335)  prochlorperazine (COMPAZINE) injection 5 mg (5 mg Intravenous Given 04/29/20 2138)  ketorolac (TORADOL) 30 MG/ML injection 15 mg (15 mg Intravenous Given 04/29/20 2138)  diphenhydrAMINE (BENADRYL) injection 12.5 mg (12.5 mg Intravenous Given 04/29/20 2138)  acetaminophen (TYLENOL) tablet 650 mg (650 mg Oral Given 04/29/20 2336)  amoxicillin-clavulanate (AUGMENTIN) 250-125 MG per tablet 1 tablet (1 tablet Oral Given 04/29/20 2336)    ED Course  I have reviewed the triage vital signs and the nursing notes.  Pertinent labs & imaging results that were available during my care of the patient were reviewed by me and considered in my medical decision making (see chart for details).    MDM Rules/Calculators/A&P                          Labs seem to be baseline for where I would  expect them given her end-stage renal disease.  Her vital signs reflect some hypertension, she was tachycardic initially but afebrile.  On my exam her heart rate is going down.  We will give her some pain medication, nausea medication, obtain a CT scan to make sure there is nothing else going on in the patient states that she has had a recent urinary tract infection, would like this to be checked as she states she often gets a headache when she has a urine infection.  She received antibiotics at dialysis but thinks that she may still have it.  Her MS is clear - and no stiff neck - doubt CNS infection  Pt improved with meds including Compazine, benadryl and toradol - but still tachycardic to 120 - 125.  She has temp of 99, but as a dialysis patient and with a history of allergies to multiple different antibiotics I think it would be best to admit the patient overnight, IV fluid hydration, Tylenol for low-grade fever (patient has tactile fever), improved tachycardia prior to considering discharge.  The patient states her baseline heart rate is in the eighties.  Thankfully, no leukocytosis, and CT head is negative.  Consult with hospitalist for admission.    Discussed with pharmacist re: admission - recommends augmentin 250 q 24 hours for 7 days.  First dose ordered.  D/w hospitalist who will admit  Final Clinical Impression(s) / ED Diagnoses Final diagnoses:  Acute cystitis with hematuria  Nonintractable episodic headache, unspecified headache type  Chronic kidney disease, unspecified CKD stage    Rx / DC Orders ED Discharge Orders    None       Noemi Chapel, MD 04/29/20 2338

## 2020-04-29 NOTE — ED Notes (Signed)
hemodialysis tues-thurs-Sat and did receive it on Sat.

## 2020-04-30 DIAGNOSIS — N3000 Acute cystitis without hematuria: Secondary | ICD-10-CM

## 2020-04-30 DIAGNOSIS — N3001 Acute cystitis with hematuria: Secondary | ICD-10-CM

## 2020-04-30 DIAGNOSIS — R519 Headache, unspecified: Secondary | ICD-10-CM

## 2020-04-30 DIAGNOSIS — N390 Urinary tract infection, site not specified: Secondary | ICD-10-CM | POA: Diagnosis not present

## 2020-04-30 LAB — COMPREHENSIVE METABOLIC PANEL
ALT: 15 U/L (ref 0–44)
AST: 15 U/L (ref 15–41)
Albumin: 3.3 g/dL — ABNORMAL LOW (ref 3.5–5.0)
Alkaline Phosphatase: 46 U/L (ref 38–126)
Anion gap: 18 — ABNORMAL HIGH (ref 5–15)
BUN: 41 mg/dL — ABNORMAL HIGH (ref 6–20)
CO2: 26 mmol/L (ref 22–32)
Calcium: 9.3 mg/dL (ref 8.9–10.3)
Chloride: 100 mmol/L (ref 98–111)
Creatinine, Ser: 6.86 mg/dL — ABNORMAL HIGH (ref 0.44–1.00)
GFR calc Af Amer: 9 mL/min — ABNORMAL LOW (ref >60)
GFR calc non Af Amer: 8 mL/min — ABNORMAL LOW (ref >60)
Glucose, Bld: 71 mg/dL (ref 70–99)
Potassium: 3.2 mmol/L — ABNORMAL LOW (ref 3.5–5.1)
Sodium: 144 mmol/L (ref 135–145)
Total Bilirubin: 0.8 mg/dL (ref 0.3–1.2)
Total Protein: 6.4 g/dL — ABNORMAL LOW (ref 6.5–8.1)

## 2020-04-30 LAB — CBC
HCT: 32.8 % — ABNORMAL LOW (ref 36.0–46.0)
Hemoglobin: 9.9 g/dL — ABNORMAL LOW (ref 12.0–15.0)
MCH: 28.2 pg (ref 26.0–34.0)
MCHC: 30.2 g/dL (ref 30.0–36.0)
MCV: 93.4 fL (ref 80.0–100.0)
Platelets: 255 10*3/uL (ref 150–400)
RBC: 3.51 MIL/uL — ABNORMAL LOW (ref 3.87–5.11)
RDW: 19.5 % — ABNORMAL HIGH (ref 11.5–15.5)
WBC: 6.7 10*3/uL (ref 4.0–10.5)
nRBC: 0.4 % — ABNORMAL HIGH (ref 0.0–0.2)

## 2020-04-30 LAB — RESPIRATORY PANEL BY RT PCR (FLU A&B, COVID)
Influenza A by PCR: NEGATIVE
Influenza B by PCR: NEGATIVE
SARS Coronavirus 2 by RT PCR: NEGATIVE

## 2020-04-30 LAB — HIV ANTIBODY (ROUTINE TESTING W REFLEX): HIV Screen 4th Generation wRfx: NONREACTIVE

## 2020-04-30 MED ORDER — ACETAMINOPHEN 650 MG RE SUPP: 650.0000 mg | Freq: Four times a day (QID) | RECTAL | Status: DC | PRN

## 2020-04-30 MED ORDER — ACETAMINOPHEN 325 MG PO TABS
650.0000 mg | ORAL_TABLET | Freq: Four times a day (QID) | ORAL | Status: DC | PRN
  Administered 2020-04-30: 650 mg via ORAL
  Filled 2020-04-30: qty 2

## 2020-04-30 MED ORDER — AMLODIPINE BESYLATE 5 MG PO TABS
10.0000 mg | ORAL_TABLET | Freq: Every day | ORAL | Status: DC
  Filled 2020-04-30: qty 2

## 2020-04-30 MED ORDER — ADULT MULTIVITAMIN W/MINERALS CH: 1.0000 | ORAL_TABLET | ORAL | Status: DC

## 2020-04-30 MED ORDER — HEPARIN SODIUM (PORCINE) 5000 UNIT/ML IJ SOLN
5000.0000 [IU] | Freq: Three times a day (TID) | INTRAMUSCULAR | Status: DC
  Administered 2020-04-30: 5000 [IU] via SUBCUTANEOUS
  Filled 2020-04-30: qty 1

## 2020-04-30 MED ORDER — METOPROLOL SUCCINATE ER 25 MG PO TB24
50.0000 mg | ORAL_TABLET | Freq: Every day | ORAL | Status: DC
  Administered 2020-04-30: 50 mg via ORAL
  Filled 2020-04-30: qty 2

## 2020-04-30 MED ORDER — SODIUM CHLORIDE 0.9 % IV SOLN: INTRAVENOUS | Status: AC

## 2020-04-30 MED ORDER — AMOXICILLIN-POT CLAVULANATE 250-125 MG PO TABS: 1.0000 | ORAL_TABLET | ORAL | 0 refills | Status: DC

## 2020-04-30 MED ORDER — AMOXICILLIN-POT CLAVULANATE 250-125 MG PO TABS
1.0000 | ORAL_TABLET | ORAL | Status: DC
  Filled 2020-04-30: qty 1

## 2020-04-30 MED ORDER — RENA-VITE PO TABS
1.0000 | ORAL_TABLET | ORAL | Status: DC
  Filled 2020-04-30: qty 1

## 2020-04-30 MED ORDER — ONDANSETRON HCL 4 MG/2ML IJ SOLN: 4.0000 mg | Freq: Four times a day (QID) | INTRAMUSCULAR | Status: DC | PRN

## 2020-04-30 MED ORDER — ONDANSETRON HCL 4 MG PO TABS: 4.0000 mg | ORAL_TABLET | Freq: Four times a day (QID) | ORAL | Status: DC | PRN

## 2020-04-30 MED ORDER — PANTOPRAZOLE SODIUM 40 MG PO TBEC
40.0000 mg | DELAYED_RELEASE_TABLET | Freq: Every day | ORAL | Status: DC
  Administered 2020-04-30: 40 mg via ORAL
  Filled 2020-04-30: qty 1

## 2020-04-30 NOTE — ED Notes (Signed)
Pt refused Heparin medication after RN scanned and drew up medication to administer it.

## 2020-04-30 NOTE — H&P (Signed)
TRH H&P    Patient Demographics:    [name], is a 29 y.o. female  MRN: [id number]  DOB - Sep 07, 1990  Admit Date - 04/29/2020  Referring MD/NP/PA: Sabra Heck  Outpatient Primary MD for the patient is Lucianne Lei, MD  Patient coming from: Home  Chief complaint- headache, nausea   HPI:    [name]  is a 29 y.o. female, with history of spina bifida, hypertension, GERD, ESRD, anemia, and more presents to the ED with c/c headache patient reports that she has had a bad headache for 2 days.  It has been constant.  It started on the frontal left radiates all the way around her head.  She is taken Tylenol and it has not relieved the headache.  She reports that she normally gets headaches 2 times per month and in the same starting location, but they are usually relieved with Tylenol.  She denies any fevers.  She does report that she has had nausea and vomiting.  Her last normal meal was yesterday.  She woke up nauseous today so she has not eaten all day.  She is thrown up several times just fluid.  No blood.  Patient is an ESRD patient who can sometimes void on her own, and other times uses a straight cath.  She has not had any change in her urine habits.  She has not missed any dialysis.  Her dialysis access is in her left arm fistula.  She has not been vaccinated for Covid.  Patient is full code.  In the ED Temperature 99, heart rate 120-130, respiratory rate 16, blood pressure 158/97, 100% No leukocytosis Hypokalemia of 3.2 BUN 38, creatinine 6.17 CT head is normal Patient started on Augmentin for UTI UA is suspicious for UTI Urine culture pending Patient started on fluids secondary to tachycardia infection    Review of systems:    In addition to the HPI above,  No Fever-chills, Positive for headaches, no change in vision or change in hearing No problems swallowing food or Liquids, No Chest pain,  Cough or Shortness of Breath, No Abdominal pain, positive for nausea and vomiting, bowel movements are regular, No Blood in stool or Urine, No dysuria, No new skin rashes or bruises, No new joints pains-aches,  No new weakness, tingling, numbness in any extremity, No recent weight gain or loss, No polyuria, polydypsia or polyphagia, No significant Mental Stressors.  All other systems reviewed and are negative.    Past History of the following :    Past Medical History:  Diagnosis Date  . Anemia associated with chronic renal failure   . Blood transfusion   . 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  . Depression 05/14/2015  . Dialysis care   . ESRD (end stage renal disease) on dialysis ([place])   . GERD (gastroesophageal reflux disease) 01/07/2017  . HTN (hypertension) 05/02/2011  . Spina bifida   . UTI (lower urinary tract infection)       Past Surgical History:  Procedure Laterality Date  . AV  FISTULA PLACEMENT     left arm      Social History:      Social History   Tobacco Use  . Smoking status: Former Smoker    Packs/day: 0.10    Types: Cigarettes  . Smokeless tobacco: Never Used  . Tobacco comment: 2 cigs a day  Substance Use Topics  . Alcohol use: No       Family History :     Family History  Problem Relation Age of Onset  . Kidney cancer Other   . Hypertension Maternal Grandmother   . Arthritis Maternal Grandmother   . Breast cancer Maternal Aunt   . Colon cancer Neg Hx       Home Medications:   Prior to Admission medications   Medication Sig Start Date End Date Taking? Authorizing Provider  acetaminophen (TYLENOL) 500 MG tablet Take 1,000 mg by mouth every 6 (six) hours as needed for mild pain or headache.    [provider]  amLODipine (NORVASC) 10 MG tablet SMARTSIG:1 Tablet(s) By Mouth Every Evening 11/24/19   [provider]  AURYXIA 1 GM 210 MG(Fe) tablet Take 2 tablets by mouth 3 (three) times daily with  meals. 04/15/18   [provider]  diphenhydrAMINE (BENADRYL) 25 mg capsule Take 25 mg by mouth daily as needed for allergies.    [provider]  doxycycline (VIBRAMYCIN) 100 MG capsule Take 1 capsule (100 mg total) by mouth 2 (two) times daily. 03/13/20   Evalee Jefferson, PA-C  Epoetin Alfa (EPOGEN IJ) Inject as directed See admin instructions. Every Tuesday, Thursday, Saturday    [provider]  metoCLOPramide (REGLAN) 5 MG tablet Take 1 tablet (5 mg total) by mouth 4 (four) times daily. Take one tablet 1-2 hours before dialysis 11/02/19   Doran Stabler, MD  metoprolol succinate (TOPROL-XL) 50 MG 24 hr tablet Take 50 mg by mouth daily. 12/29/19   [provider]  multivitamin (RENA-VIT) TABS tablet Take 1 tablet by mouth See admin instructions. Every Tuesday, Thursday, Saturday    [provider]  omeprazole (PRILOSEC) 20 MG capsule Take 20 mg by mouth daily.  03/26/16   [provider]  predniSONE (DELTASONE) 50 MG tablet Take one tablet daily for 4 days. 03/14/20   Evalee Jefferson, PA-C  VELPHORO 500 MG chewable tablet Chew 500 mg by mouth 3 (three) times daily. 11/17/19   [provider]     Allergies:     Allergies  Allergen Reactions  . Ciprofloxacin Shortness Of Breath, Nausea And Vomiting and Other (See Comments)    HIGH FEVER and oral blisters   . Other Anaphylaxis    Revaclear dialzer  . Peanut-Containing Drug Products Anaphylaxis  . Aleve [Naproxen Sodium] Other (See Comments)    G.I.Bleed  . Ceftriaxone Other (See Comments)    Blisters in mouth   . Coconut Oil Hives  . Influenza Vaccines Nausea And Vomiting and Other (See Comments)    High fever  . Tetanus Toxoid, Adsorbed Nausea And Vomiting and Other (See Comments)    HIGH FEVER, also  . Tetanus Toxoids Nausea And Vomiting and Other (See Comments)    HIGH FEVER  . Latex Itching and Rash     Physical Exam:   Vitals  Blood pressure (!) 158/97, pulse (!) 120,  temperature 99 F (37.2 C), temperature source Oral, resp. rate 16, weight 29.5 kg, SpO2 100 %.  1.  General: Lying supine in bed in no acute distress  2. Psychiatric: Mood and behavior normal for situation  3. Neurologic: Cranial nerves II through XII are grossly intact, moves upper extremities voluntarily, is not able to move lower extremities  4. HEENMT:  Head is atraumatic, normocephalic, pupils reactive to light, neck is supple, trachea midline  5. Respiratory : Lungs are clear to auscultation bilaterally  6. Cardiovascular : Heart rate is tachycardic with systolic murmur, rhythm is regular  7. Gastrointestinal:  Abdomen is soft, nondistended, nontender to palpation  8. Skin:  No acute lesions on limited skin exam  9.Musculoskeletal:  Atrophy of lower extremities, upper extremities are without acute deformity    Data Review:    CBC Recent Labs  Lab 04/29/20 1720  WBC 6.2  HGB 11.2*  HCT 37.5  PLT 267  MCV 92.6  MCH 27.7  MCHC 29.9*  RDW 19.2*  LYMPHSABS 1.4  MONOABS 0.6  EOSABS 0.1  BASOSABS 0.0   ------------------------------------------------------------------------------------------------------------------  Results for orders placed or performed during the hospital encounter of 04/29/20 (from the past 48 hour(s))  CBC with Differential     Status: Abnormal   Collection Time: 04/29/20  5:20 PM  Result Value Ref Range   WBC 6.2 4.0 - 10.5 K/uL   RBC 4.05 3.87 - 5.11 MIL/uL   Hemoglobin 11.2 (L) 12.0 - 15.0 g/dL   HCT 37.5 36 - 46 %   MCV 92.6 80.0 - 100.0 fL   MCH 27.7 26.0 - 34.0 pg   MCHC 29.9 (L) 30.0 - 36.0 g/dL   RDW 19.2 (H) 11.5 - 15.5 %   Platelets 267 150 - 400 K/uL   nRBC 0.5 (H) 0.0 - 0.2 %   Neutrophils Relative % 66 %   Neutro Abs 4.1 1.7 - 7.7 K/uL   Lymphocytes Relative 22 %   Lymphs Abs 1.4 0.7 - 4.0 K/uL   Monocytes Relative 9 %   Monocytes Absolute 0.6 0 - 1 K/uL   Eosinophils Relative 2 %   Eosinophils Absolute 0.1 0 -  0 K/uL   Basophils Relative 0 %   Basophils Absolute 0.0 0 - 0 K/uL   Immature Granulocytes 1 %   Abs Immature Granulocytes 0.03 0.00 - 0.07 K/uL    Comment: Performed at [facility], [address], [place], [place] [address]  Basic metabolic panel     Status: Abnormal   Collection Time: 04/29/20  5:20 PM  Result Value Ref Range   Sodium 143 135 - 145 mmol/L   Potassium 3.2 (L) 3.5 - 5.1 mmol/L   Chloride 96 (L) 98 - 111 mmol/L   CO2 30 22 - 32 mmol/L   Glucose, Bld 94 70 - 99 mg/dL    Comment: Glucose reference range applies only to samples taken after fasting for at least 8 hours.   BUN 38 (H) 6 - 20 mg/dL   Creatinine, Ser 6.17 (H) 0.44 - 1.00 mg/dL   Calcium 9.8 8.9 - 10.3 mg/dL   GFR calc non Af Amer 9 (L) >60 mL/min   GFR calc Af Amer 10 (L) >60 mL/min   Anion gap 17 (H) 5 - 15    Comment: Performed at [facility], [address], [place], [place] [address]  Urinalysis, Routine w reflex microscopic Urine, Catheterized     Status: Abnormal   Collection Time: 04/29/20  9:29 PM  Result Value Ref Range   Color, Urine YELLOW YELLOW   APPearance CLOUDY (A) CLEAR   Specific Gravity, Urine 1.008 1.005 - 1.030  pH 9.0 (H) 5.0 - 8.0   Glucose, UA 50 (A) NEGATIVE mg/dL   Hgb urine dipstick SMALL (A) NEGATIVE   Bilirubin Urine NEGATIVE NEGATIVE   Ketones, ur NEGATIVE NEGATIVE mg/dL   Protein, ur >=300 (A) NEGATIVE mg/dL   Nitrite NEGATIVE NEGATIVE   Leukocytes,Ua LARGE (A) NEGATIVE   RBC / HPF >50 (H) 0 - 5 RBC/hpf   WBC, UA 21-50 0 - 5 WBC/hpf   Bacteria, UA RARE (A) NONE SEEN   Squamous Epithelial / LPF 0-5 0 - 5   WBC Clumps PRESENT     Comment: Performed at [facility], [address] [place], [place] [address]  Respiratory Panel by RT PCR (Flu A&B, Covid) - Nasopharyngeal Swab     Status: None   Collection Time: 04/29/20 11:38 PM   Specimen: Nasopharyngeal Swab  Result Value Ref Range   SARS Coronavirus 2 by RT PCR NEGATIVE NEGATIVE    Comment: (NOTE) SARS-CoV-2  target nucleic acids are NOT DETECTED.  The SARS-CoV-2 RNA is generally detectable in upper respiratoy specimens during the acute phase of infection. The lowest concentration of SARS-CoV-2 viral copies this assay can detect is 131 copies/mL. A negative result does not preclude SARS-Cov-2 infection and should not be used as the sole basis for treatment or other patient management decisions. A negative result may occur with  improper specimen collection/handling, submission of specimen other than nasopharyngeal swab, presence of viral mutation(s) within the areas targeted by this assay, and inadequate number of viral copies (<131 copies/mL). A negative result must be combined with clinical observations, patient history, and epidemiological information. The expected result is Negative.  Fact Sheet for Patients:  PinkCheek.be  Fact Sheet for Healthcare Providers:  GravelBags.it  This test is no t yet approved or cleared by the [place] FDA and  has been authorized for detection and/or diagnosis of SARS-CoV-2 by FDA under an Emergency Use Authorization (EUA). This EUA will remain  in effect (meaning this test can be used) for the duration of the COVID-19 declaration under Section 564(b)(1) of the Act, 21 U.S.C. section 360bbb-3(b)(1), unless the authorization is terminated or revoked sooner.     Influenza A by PCR NEGATIVE NEGATIVE   Influenza B by PCR NEGATIVE NEGATIVE    Comment: (NOTE) The Xpert Xpress SARS-CoV-2/FLU/RSV assay is intended as an aid in  the diagnosis of influenza from Nasopharyngeal swab specimens and  should not be used as a sole basis for treatment. Nasal washings and  aspirates are unacceptable for Xpert Xpress SARS-CoV-2/FLU/RSV  testing.  Fact Sheet for Patients: PinkCheek.be  Fact Sheet for Healthcare Providers: GravelBags.it  This  test is not yet approved or cleared by the [place] FDA and  has been authorized for detection and/or diagnosis of SARS-CoV-2 by  FDA under an Emergency Use Authorization (EUA). This EUA will remain  in effect (meaning this test can be used) for the duration of the  Covid-19 declaration under Section 564(b)(1) of the Act, 21  U.S.C. section 360bbb-3(b)(1), unless the authorization is  terminated or revoked. Performed at [facility], [address], [place], [place] [address]     Chemistries  Recent Labs  Lab 04/29/20 1720  NA 143  K 3.2*  CL 96*  CO2 30  GLUCOSE 94  BUN 38*  CREATININE 6.17*  CALCIUM 9.8   ------------------------------------------------------------------------------------------------------------------  ------------------------------------------------------------------------------------------------------------------ GFR: CrCl cannot be calculated (Unknown ideal weight.). Liver Function Tests: No results for input(s): AST, ALT, ALKPHOS, BILITOT, PROT, ALBUMIN in the last  168 hours. No results for input(s): LIPASE, AMYLASE in the last 168 hours. No results for input(s): AMMONIA in the last 168 hours. Coagulation Profile: No results for input(s): INR, PROTIME in the last 168 hours. Cardiac Enzymes: No results for input(s): CKTOTAL, CKMB, CKMBINDEX, TROPONINI in the last 168 hours. BNP (last 3 results) No results for input(s): PROBNP in the last 8760 hours. HbA1C: No results for input(s): HGBA1C in the last 72 hours. CBG: No results for input(s): GLUCAP in the last 168 hours. Lipid Profile: No results for input(s): CHOL, HDL, LDLCALC, TRIG, CHOLHDL, LDLDIRECT in the last 72 hours. Thyroid Function Tests: No results for input(s): TSH, T4TOTAL, FREET4, T3FREE, THYROIDAB in the last 72 hours. Anemia Panel: No results for input(s): VITAMINB12, FOLATE, FERRITIN, TIBC, IRON, RETICCTPCT in the last 72  hours.  --------------------------------------------------------------------------------------------------------------- Urine analysis:    Component Value Date/Time   COLORURINE YELLOW 04/29/2020 2129   APPEARANCEUR CLOUDY (A) 04/29/2020 2129   LABSPEC 1.008 04/29/2020 2129   PHURINE 9.0 (H) 04/29/2020 2129   GLUCOSEU 50 (A) 04/29/2020 2129   HGBUR SMALL (A) 04/29/2020 2129   BILIRUBINUR NEGATIVE 04/29/2020 2129   KETONESUR NEGATIVE 04/29/2020 2129   PROTEINUR >=300 (A) 04/29/2020 2129   UROBILINOGEN 0.2 05/11/2015 0906   NITRITE NEGATIVE 04/29/2020 2129   LEUKOCYTESUR LARGE (A) 04/29/2020 2129      Imaging Results:    CT Head Wo Contrast  Result Date: 04/29/2020 CLINICAL DATA:  Headache for 3 days with nausea and vomiting EXAM: CT HEAD WITHOUT CONTRAST TECHNIQUE: Contiguous axial images were obtained from the base of the skull through the vertex without intravenous contrast. COMPARISON:  05/07/2018 FINDINGS: Brain: There is no mass, hemorrhage or extra-axial collection. The size and configuration of the ventricles and extra-axial CSF spaces are normal. The brain parenchyma is normal, without acute or chronic infarction. Vascular: No abnormal hyperdensity of the major intracranial arteries or dural venous sinuses. No intracranial atherosclerosis. Skull: The visualized skull base, calvarium and extracranial soft tissues are normal. Sinuses/Orbits: No fluid levels or advanced mucosal thickening of the visualized paranasal sinuses. No mastoid or middle ear effusion. The orbits are normal. IMPRESSION: Normal head CT. Electronically Signed   By: Ulyses Jarred M.D.   On: 04/29/2020 22:00      Assessment & Plan:    Active Problems:   UTI (urinary tract infection)   1. UTI 1. UA indicative of UTI 2. Many drug allergies - pharmacy rec'd Augmentin 3. Continue augmentin 4. Urine culture pending 2. Hypokalemia 1. ESRD patient on hemodialysis 2. Consult nephro 3. Tachycardia 1. HR 120  - 130 2. 2/2 dehydration and infection 3. 273ml NaCl for 5 hours 4. Continue to monitor 4. ESRD 1. Consult nephro 2. Left arm fistula 3. Consult nephro   DVT Prophylaxis-   heparin - SCDs  AM Labs Ordered, also please review Full Orders  Family Communication: No family at bedside Code Status:  Full  Admission status: Observation  Time spent in minutes : Perry DO

## 2020-04-30 NOTE — Progress Notes (Signed)
I went in to evaluate pt and make plans for inpatient dialysis tomorrow.  She told me she was feeling much better and wanted to go home and will go to OP HD tomorrow.  I informed the RN Jerene Pitch so that she can pass this along to the primary team   Louis Meckel

## 2020-04-30 NOTE — Discharge Summary (Signed)
Physician Discharge Summary  [name] TWS:[id number] DOB: November 01, 1990 DOA: 04/29/2020  PCP: Lucianne Lei, MD  Admit date: 04/29/2020 Discharge date: 04/30/2020  Time spent: 50* minutes  Recommendations for Outpatient Follow-up:   1. Follow-up PCP in 1 week  2. Patient does not want to stay in the hospital, will discharge on Augmentin for 6 more days.  Urine culture is pending.  3. Follow-up urine culture results as outpatient.   Discharge Diagnoses:  Active Problems:   UTI (urinary tract infection)   Discharge Condition: Stable  Diet recommendation: Heart healthy diet  Filed Weights   04/29/20 1700  Weight: 29.5 kg    History of present illness:  29 year old female with history of spina bifida, hypertension, GERD, ESRD, anemia presented to ED with complaints of headache for 2 days.  Stated that she had frontal headache which radiates around her head.  She takes Tylenol did not relieve the headache.  CT head was unremarkable.  Patient was found to have abnormal UA and started on Augmentin for UTI.  Hospital Course:   Headache-resolved, unclear etiology.  Patient does not have neck stiffness or fever.  CT head was normal, did not show evidence of sinusitis.  UTI-patient found to have a normal UA, started on Augmentin due to multiple allergies.  Urine culture has been obtained.  Patient does not want to stay in the hospital so will be discharged home on Augmentin for 6 more days.  PCP will follow urine culture results as outpatient.  ESRD-patient is on hemodialysis, nephrology consulted.  Patient does not want to stay in the hospital for dialysis.  Patient told nephrologist that she would go for outpatient hemodialysis tomorrow.    Procedures:    Consultations:  Nephrology  Discharge Exam: Vitals:   04/30/20 1200 04/30/20 1215  BP: (!) 162/99   Pulse:  (!) 113  Resp:    Temp:    SpO2: 98% 99%    General: Appears in no acute distress Cardiovascular:  S1-S2, regular Respiratory: Clear to auscultation bilaterally  Discharge Instructions   Discharge Instructions    Diet - low sodium heart healthy   Complete by: As directed    Discharge instructions   Complete by: As directed    Follow urine culture result as outpatient   Increase activity slowly   Complete by: As directed      Allergies as of 04/30/2020      Reactions   Ciprofloxacin Shortness Of Breath, Nausea And Vomiting, Other (See Comments)   HIGH FEVER and oral blisters    Other Anaphylaxis   Revaclear dialzer   Peanut-containing Drug Products Anaphylaxis   Aleve [naproxen Sodium] Other (See Comments)   G.I.Bleed   Ceftriaxone Other (See Comments)   Blisters in mouth   Coconut Oil Hives   Influenza Vaccines Nausea And Vomiting, Other (See Comments)   High fever   Tetanus Toxoid, Adsorbed Nausea And Vomiting, Other (See Comments)   HIGH FEVER, also   Tetanus Toxoids Nausea And Vomiting, Other (See Comments)   HIGH FEVER   Latex Itching, Rash      Medication List    STOP taking these medications   doxycycline 100 MG capsule Commonly known as: VIBRAMYCIN   predniSONE 50 MG tablet Commonly known as: DELTASONE   Velphoro 500 MG chewable tablet Generic drug: sucroferric oxyhydroxide     TAKE these medications   acetaminophen 500 MG tablet Commonly known as: TYLENOL Take 1,000 mg by mouth every 6 (six) hours as needed  for mild pain or headache.   amLODipine 5 MG tablet Commonly known as: NORVASC Take 5 mg by mouth at bedtime.   amoxicillin-clavulanate 250-125 MG tablet Commonly known as: AUGMENTIN Take 1 tablet by mouth daily for 6 days.   Auryxia 1 GM 210 MG(Fe) tablet Generic drug: ferric citrate Take 2 tablets by mouth 3 (three) times daily with meals.   diphenhydrAMINE 25 mg capsule Commonly known as: BENADRYL Take 25 mg by mouth daily as needed for allergies.   EPOGEN IJ Inject as directed See admin instructions. Every Tuesday, Thursday,  Saturday   hydrALAZINE 50 MG tablet Commonly known as: APRESOLINE Take 50 mg by mouth 3 (three) times daily.   metoCLOPramide 5 MG tablet Commonly known as: Reglan Take 1 tablet (5 mg total) by mouth 4 (four) times daily. Take one tablet 1-2 hours before dialysis   metoprolol succinate 50 MG 24 hr tablet Commonly known as: TOPROL-XL Take 50 mg by mouth daily.   montelukast 10 MG tablet Commonly known as: SINGULAIR Take 10 mg by mouth at bedtime.   multivitamin Tabs tablet Take 1 tablet by mouth See admin instructions. Every Tuesday, Thursday, Saturday   Dialyvite 800 0.8 MG Tabs Take 0.8 mg by mouth daily.   omeprazole 20 MG capsule Commonly known as: PRILOSEC Take 20 mg by mouth daily.      Allergies  Allergen Reactions  . Ciprofloxacin Shortness Of Breath, Nausea And Vomiting and Other (See Comments)    HIGH FEVER and oral blisters   . Other Anaphylaxis    Revaclear dialzer  . Peanut-Containing Drug Products Anaphylaxis  . Aleve [Naproxen Sodium] Other (See Comments)    G.I.Bleed  . Ceftriaxone Other (See Comments)    Blisters in mouth   . Coconut Oil Hives  . Influenza Vaccines Nausea And Vomiting and Other (See Comments)    High fever  . Tetanus Toxoid, Adsorbed Nausea And Vomiting and Other (See Comments)    HIGH FEVER, also  . Tetanus Toxoids Nausea And Vomiting and Other (See Comments)    HIGH FEVER  . Latex Itching and Rash      The results of significant diagnostics from this hospitalization (including imaging, microbiology, ancillary and laboratory) are listed below for reference.    Significant Diagnostic Studies: CT Head Wo Contrast  Result Date: 04/29/2020 CLINICAL DATA:  Headache for 3 days with nausea and vomiting EXAM: CT HEAD WITHOUT CONTRAST TECHNIQUE: Contiguous axial images were obtained from the base of the skull through the vertex without intravenous contrast. COMPARISON:  05/07/2018 FINDINGS: Brain: There is no mass, hemorrhage or  extra-axial collection. The size and configuration of the ventricles and extra-axial CSF spaces are normal. The brain parenchyma is normal, without acute or chronic infarction. Vascular: No abnormal hyperdensity of the major intracranial arteries or dural venous sinuses. No intracranial atherosclerosis. Skull: The visualized skull base, calvarium and extracranial soft tissues are normal. Sinuses/Orbits: No fluid levels or advanced mucosal thickening of the visualized paranasal sinuses. No mastoid or middle ear effusion. The orbits are normal. IMPRESSION: Normal head CT. Electronically Signed   By: Ulyses Jarred M.D.   On: 04/29/2020 22:00    Microbiology: Recent Results (from the past 240 hour(s))  Respiratory Panel by RT PCR (Flu A&B, Covid) - Nasopharyngeal Swab     Status: None   Collection Time: 04/29/20 11:38 PM   Specimen: Nasopharyngeal Swab  Result Value Ref Range Status   SARS Coronavirus 2 by RT PCR NEGATIVE NEGATIVE Final  Comment: (NOTE) SARS-CoV-2 target nucleic acids are NOT DETECTED.  The SARS-CoV-2 RNA is generally detectable in upper respiratoy specimens during the acute phase of infection. The lowest concentration of SARS-CoV-2 viral copies this assay can detect is 131 copies/mL. A negative result does not preclude SARS-Cov-2 infection and should not be used as the sole basis for treatment or other patient management decisions. A negative result may occur with  improper specimen collection/handling, submission of specimen other than nasopharyngeal swab, presence of viral mutation(s) within the areas targeted by this assay, and inadequate number of viral copies (<131 copies/mL). A negative result must be combined with clinical observations, patient history, and epidemiological information. The expected result is Negative.  Fact Sheet for Patients:  PinkCheek.be  Fact Sheet for Healthcare Providers:   GravelBags.it  This test is no t yet approved or cleared by the [place] FDA and  has been authorized for detection and/or diagnosis of SARS-CoV-2 by FDA under an Emergency Use Authorization (EUA). This EUA will remain  in effect (meaning this test can be used) for the duration of the COVID-19 declaration under Section 564(b)(1) of the Act, 21 U.S.C. section 360bbb-3(b)(1), unless the authorization is terminated or revoked sooner.     Influenza A by PCR NEGATIVE NEGATIVE Final   Influenza B by PCR NEGATIVE NEGATIVE Final    Comment: (NOTE) The Xpert Xpress SARS-CoV-2/FLU/RSV assay is intended as an aid in  the diagnosis of influenza from Nasopharyngeal swab specimens and  should not be used as a sole basis for treatment. Nasal washings and  aspirates are unacceptable for Xpert Xpress SARS-CoV-2/FLU/RSV  testing.  Fact Sheet for Patients: PinkCheek.be  Fact Sheet for Healthcare Providers: GravelBags.it  This test is not yet approved or cleared by the [place] FDA and  has been authorized for detection and/or diagnosis of SARS-CoV-2 by  FDA under an Emergency Use Authorization (EUA). This EUA will remain  in effect (meaning this test can be used) for the duration of the  Covid-19 declaration under Section 564(b)(1) of the Act, 21  U.S.C. section 360bbb-3(b)(1), unless the authorization is  terminated or revoked. Performed at [facility], [address] [place],  [address]      Labs: Basic Metabolic Panel: Recent Labs  Lab 04/29/20 1720 04/30/20 0455  NA 143 144  K 3.2* 3.2*  CL 96* 100  CO2 30 26  GLUCOSE 94 71  BUN 38* 41*  CREATININE 6.17* 6.86*  CALCIUM 9.8 9.3   Liver Function Tests: Recent Labs  Lab 04/30/20 0455  AST 15  ALT 15  ALKPHOS 46  BILITOT 0.8  PROT 6.4*  ALBUMIN 3.3*   No results for input(s): LIPASE, AMYLASE in the last 168 hours. No  results for input(s): AMMONIA in the last 168 hours. CBC: Recent Labs  Lab 04/29/20 1720 04/30/20 0455  WBC 6.2 6.7  NEUTROABS 4.1  --   HGB 11.2* 9.9*  HCT 37.5 32.8*  MCV 92.6 93.4  PLT 267 255       Signed:  Oswald Hillock MD.  [facility] 04/30/2020, 2:24 PM

## 2020-05-01 DIAGNOSIS — N2581 Secondary hyperparathyroidism of renal origin: Secondary | ICD-10-CM | POA: Diagnosis not present

## 2020-05-01 DIAGNOSIS — D631 Anemia in chronic kidney disease: Secondary | ICD-10-CM | POA: Diagnosis not present

## 2020-05-01 DIAGNOSIS — Z992 Dependence on renal dialysis: Secondary | ICD-10-CM | POA: Diagnosis not present

## 2020-05-01 DIAGNOSIS — N186 End stage renal disease: Secondary | ICD-10-CM | POA: Diagnosis not present

## 2020-05-01 DIAGNOSIS — R52 Pain, unspecified: Secondary | ICD-10-CM | POA: Diagnosis not present

## 2020-05-01 LAB — URINE CULTURE: Culture: NO GROWTH

## 2020-05-01 NOTE — ED Notes (Addendum)
Call from pt stating her RX to [place] was not rcvd; per pharmacist, dose ordered not available, per Marcene Brawn, PA, change dose to Augmentin 500-125, 1 tab PO daily for 6 days, RX called into [place]

## 2020-05-03 DIAGNOSIS — Z992 Dependence on renal dialysis: Secondary | ICD-10-CM | POA: Diagnosis not present

## 2020-05-03 DIAGNOSIS — R52 Pain, unspecified: Secondary | ICD-10-CM | POA: Diagnosis not present

## 2020-05-03 DIAGNOSIS — N186 End stage renal disease: Secondary | ICD-10-CM | POA: Diagnosis not present

## 2020-05-03 DIAGNOSIS — D631 Anemia in chronic kidney disease: Secondary | ICD-10-CM | POA: Diagnosis not present

## 2020-05-03 DIAGNOSIS — N2581 Secondary hyperparathyroidism of renal origin: Secondary | ICD-10-CM | POA: Diagnosis not present

## 2020-05-04 DIAGNOSIS — Z992 Dependence on renal dialysis: Secondary | ICD-10-CM | POA: Diagnosis not present

## 2020-05-04 DIAGNOSIS — T82898A Other specified complication of vascular prosthetic devices, implants and grafts, initial encounter: Secondary | ICD-10-CM | POA: Diagnosis not present

## 2020-05-04 DIAGNOSIS — N186 End stage renal disease: Secondary | ICD-10-CM | POA: Diagnosis not present

## 2020-05-05 DIAGNOSIS — Z803 Family history of malignant neoplasm of breast: Secondary | ICD-10-CM

## 2020-05-05 DIAGNOSIS — E8889 Other specified metabolic disorders: Secondary | ICD-10-CM | POA: Diagnosis present

## 2020-05-05 DIAGNOSIS — R112 Nausea with vomiting, unspecified: Secondary | ICD-10-CM | POA: Diagnosis not present

## 2020-05-05 DIAGNOSIS — Z992 Dependence on renal dialysis: Secondary | ICD-10-CM | POA: Diagnosis not present

## 2020-05-05 DIAGNOSIS — Z87891 Personal history of nicotine dependence: Secondary | ICD-10-CM

## 2020-05-05 DIAGNOSIS — K5900 Constipation, unspecified: Secondary | ICD-10-CM | POA: Diagnosis present

## 2020-05-05 DIAGNOSIS — Y832 Surgical operation with anastomosis, bypass or graft as the cause of abnormal reaction of the patient, or of later complication, without mention of misadventure at the time of the procedure: Secondary | ICD-10-CM | POA: Diagnosis present

## 2020-05-05 DIAGNOSIS — Z9115 Patient's noncompliance with renal dialysis: Secondary | ICD-10-CM

## 2020-05-05 DIAGNOSIS — D631 Anemia in chronic kidney disease: Secondary | ICD-10-CM | POA: Diagnosis present

## 2020-05-05 DIAGNOSIS — G4489 Other headache syndrome: Secondary | ICD-10-CM | POA: Diagnosis not present

## 2020-05-05 DIAGNOSIS — Z881 Allergy status to other antibiotic agents status: Secondary | ICD-10-CM

## 2020-05-05 DIAGNOSIS — T82590A Other mechanical complication of surgically created arteriovenous fistula, initial encounter: Principal | ICD-10-CM | POA: Diagnosis present

## 2020-05-05 DIAGNOSIS — Z91040 Latex allergy status: Secondary | ICD-10-CM

## 2020-05-05 DIAGNOSIS — N390 Urinary tract infection, site not specified: Secondary | ICD-10-CM | POA: Diagnosis present

## 2020-05-05 DIAGNOSIS — I10 Essential (primary) hypertension: Secondary | ICD-10-CM | POA: Diagnosis not present

## 2020-05-05 DIAGNOSIS — Z79899 Other long term (current) drug therapy: Secondary | ICD-10-CM

## 2020-05-05 DIAGNOSIS — N186 End stage renal disease: Secondary | ICD-10-CM | POA: Diagnosis present

## 2020-05-05 DIAGNOSIS — F32A Depression, unspecified: Secondary | ICD-10-CM | POA: Diagnosis present

## 2020-05-05 DIAGNOSIS — Z8261 Family history of arthritis: Secondary | ICD-10-CM

## 2020-05-05 DIAGNOSIS — K219 Gastro-esophageal reflux disease without esophagitis: Secondary | ICD-10-CM | POA: Diagnosis present

## 2020-05-05 DIAGNOSIS — Z8051 Family history of malignant neoplasm of kidney: Secondary | ICD-10-CM

## 2020-05-05 DIAGNOSIS — Q059 Spina bifida, unspecified: Secondary | ICD-10-CM

## 2020-05-05 DIAGNOSIS — R000 Tachycardia, unspecified: Secondary | ICD-10-CM | POA: Diagnosis not present

## 2020-05-05 DIAGNOSIS — Z91010 Allergy to peanuts: Secondary | ICD-10-CM

## 2020-05-05 DIAGNOSIS — N2581 Secondary hyperparathyroidism of renal origin: Secondary | ICD-10-CM | POA: Diagnosis not present

## 2020-05-05 DIAGNOSIS — I120 Hypertensive chronic kidney disease with stage 5 chronic kidney disease or end stage renal disease: Secondary | ICD-10-CM | POA: Diagnosis not present

## 2020-05-05 DIAGNOSIS — R110 Nausea: Secondary | ICD-10-CM | POA: Diagnosis not present

## 2020-05-05 DIAGNOSIS — Z888 Allergy status to other drugs, medicaments and biological substances status: Secondary | ICD-10-CM

## 2020-05-05 DIAGNOSIS — Z20822 Contact with and (suspected) exposure to covid-19: Secondary | ICD-10-CM | POA: Diagnosis not present

## 2020-05-05 DIAGNOSIS — Z8249 Family history of ischemic heart disease and other diseases of the circulatory system: Secondary | ICD-10-CM

## 2020-05-05 DIAGNOSIS — R52 Pain, unspecified: Secondary | ICD-10-CM | POA: Diagnosis not present

## 2020-05-06 ENCOUNTER — Inpatient Hospital Stay (HOSPITAL_COMMUNITY)
Admission: EM | Admit: 2020-05-06 | Discharge: 2020-05-09 | DRG: 314 | Disposition: A | Discharge status: Home / Self Care | Payer: Medicare Other | Attending: Internal Medicine | Admitting: Internal Medicine

## 2020-05-06 ENCOUNTER — Other Ambulatory Visit: Payer: Self-pay

## 2020-05-06 ENCOUNTER — Encounter (HOSPITAL_COMMUNITY): Payer: Self-pay | Admitting: Emergency Medicine

## 2020-05-06 DIAGNOSIS — Z992 Dependence on renal dialysis: Secondary | ICD-10-CM | POA: Diagnosis not present

## 2020-05-06 DIAGNOSIS — I120 Hypertensive chronic kidney disease with stage 5 chronic kidney disease or end stage renal disease: Secondary | ICD-10-CM | POA: Diagnosis not present

## 2020-05-06 DIAGNOSIS — N3000 Acute cystitis without hematuria: Secondary | ICD-10-CM

## 2020-05-06 DIAGNOSIS — T82590A Other mechanical complication of surgically created arteriovenous fistula, initial encounter: Secondary | ICD-10-CM

## 2020-05-06 DIAGNOSIS — D631 Anemia in chronic kidney disease: Secondary | ICD-10-CM | POA: Diagnosis not present

## 2020-05-06 DIAGNOSIS — N186 End stage renal disease: Secondary | ICD-10-CM

## 2020-05-06 DIAGNOSIS — N390 Urinary tract infection, site not specified: Secondary | ICD-10-CM | POA: Diagnosis present

## 2020-05-06 DIAGNOSIS — A419 Sepsis, unspecified organism: Secondary | ICD-10-CM | POA: Diagnosis present

## 2020-05-06 DIAGNOSIS — T8249XA Other complication of vascular dialysis catheter, initial encounter: Secondary | ICD-10-CM

## 2020-05-06 DIAGNOSIS — T82898A Other specified complication of vascular prosthetic devices, implants and grafts, initial encounter: Secondary | ICD-10-CM

## 2020-05-06 LAB — URINALYSIS, ROUTINE W REFLEX MICROSCOPIC
Bilirubin Urine: NEGATIVE
Glucose, UA: 50 mg/dL — AB
Ketones, ur: NEGATIVE mg/dL
Nitrite: NEGATIVE
Protein, ur: 100 mg/dL — AB
Specific Gravity, Urine: 1.009 (ref 1.005–1.030)
WBC, UA: >50 WBC/hpf — ABNORMAL HIGH (ref 0–5)
pH: 9 — ABNORMAL HIGH (ref 5.0–8.0)

## 2020-05-06 LAB — COMPREHENSIVE METABOLIC PANEL
ALT: 18 U/L (ref 0–44)
AST: 22 U/L (ref 15–41)
Albumin: 3.6 g/dL (ref 3.5–5.0)
Alkaline Phosphatase: 46 U/L (ref 38–126)
Anion gap: 19 — ABNORMAL HIGH (ref 5–15)
BUN: 55 mg/dL — ABNORMAL HIGH (ref 6–20)
CO2: 26 mmol/L (ref 22–32)
Calcium: 9.7 mg/dL (ref 8.9–10.3)
Chloride: 97 mmol/L — ABNORMAL LOW (ref 98–111)
Creatinine, Ser: 9.05 mg/dL — ABNORMAL HIGH (ref 0.44–1.00)
GFR, Estimated: 5 mL/min — ABNORMAL LOW (ref >60)
Glucose, Bld: 90 mg/dL (ref 70–99)
Potassium: 3.7 mmol/L (ref 3.5–5.1)
Sodium: 142 mmol/L (ref 135–145)
Total Bilirubin: 0.9 mg/dL (ref 0.3–1.2)
Total Protein: 6.9 g/dL (ref 6.5–8.1)

## 2020-05-06 LAB — CBC
HCT: 31.5 % — ABNORMAL LOW (ref 36.0–46.0)
Hemoglobin: 9.7 g/dL — ABNORMAL LOW (ref 12.0–15.0)
MCH: 28.4 pg (ref 26.0–34.0)
MCHC: 30.8 g/dL (ref 30.0–36.0)
MCV: 92.1 fL (ref 80.0–100.0)
Platelets: 225 10*3/uL (ref 150–400)
RBC: 3.42 MIL/uL — ABNORMAL LOW (ref 3.87–5.11)
RDW: 17.9 % — ABNORMAL HIGH (ref 11.5–15.5)
WBC: 5.6 10*3/uL (ref 4.0–10.5)
nRBC: 0 % (ref 0.0–0.2)

## 2020-05-06 LAB — RESPIRATORY PANEL BY RT PCR (FLU A&B, COVID)
Influenza A by PCR: NEGATIVE
Influenza B by PCR: NEGATIVE
SARS Coronavirus 2 by RT PCR: NEGATIVE

## 2020-05-06 LAB — LIPASE, BLOOD: Lipase: 44 U/L (ref 11–51)

## 2020-05-06 MED ORDER — CHLORHEXIDINE GLUCONATE CLOTH 2 % EX PADS
6.0000 | MEDICATED_PAD | Freq: Every day | CUTANEOUS | Status: DC
  Administered 2020-05-08 – 2020-05-09 (×2): 6 via TOPICAL

## 2020-05-06 MED ORDER — ONDANSETRON HCL 4 MG/2ML IJ SOLN
4.0000 mg | Freq: Four times a day (QID) | INTRAMUSCULAR | Status: DC | PRN
  Administered 2020-05-06 (×3): 4 mg via INTRAVENOUS
  Filled 2020-05-06 (×4): qty 2

## 2020-05-06 MED ORDER — MONTELUKAST SODIUM 10 MG PO TABS
10.0000 mg | ORAL_TABLET | Freq: Every day | ORAL | Status: DC
  Administered 2020-05-07 – 2020-05-08 (×3): 10 mg via ORAL
  Filled 2020-05-06 (×3): qty 1

## 2020-05-06 MED ORDER — OXYCODONE HCL 5 MG PO TABS
5.0000 mg | ORAL_TABLET | Freq: Four times a day (QID) | ORAL | Status: DC | PRN
  Administered 2020-05-06 – 2020-05-09 (×6): 5 mg via ORAL
  Filled 2020-05-06 (×6): qty 1

## 2020-05-06 MED ORDER — ACETAMINOPHEN 650 MG RE SUPP: 650.0000 mg | Freq: Four times a day (QID) | RECTAL | Status: DC | PRN

## 2020-05-06 MED ORDER — FERRIC CITRATE 1 GM 210 MG(FE) PO TABS
420.0000 mg | ORAL_TABLET | Freq: Three times a day (TID) | ORAL | Status: DC
  Administered 2020-05-06 – 2020-05-08 (×3): 420 mg via ORAL
  Filled 2020-05-06 (×16): qty 2

## 2020-05-06 MED ORDER — PIPERACILLIN-TAZOBACTAM 3.375 G IVPB 30 MIN
3.3750 g | Freq: Once | INTRAVENOUS | Status: AC
  Administered 2020-05-06: 3.375 g via INTRAVENOUS
  Filled 2020-05-06: qty 50

## 2020-05-06 MED ORDER — HEPARIN SODIUM (PORCINE) 5000 UNIT/ML IJ SOLN
5000.0000 [IU] | Freq: Three times a day (TID) | INTRAMUSCULAR | Status: DC
  Administered 2020-05-06 (×2): 5000 [IU] via SUBCUTANEOUS
  Filled 2020-05-06 (×6): qty 1

## 2020-05-06 MED ORDER — SODIUM CHLORIDE 0.9 % IV SOLN: 250.0000 mL | INTRAVENOUS | Status: DC | PRN

## 2020-05-06 MED ORDER — AMLODIPINE BESYLATE 5 MG PO TABS
5.0000 mg | ORAL_TABLET | Freq: Every day | ORAL | Status: DC
  Administered 2020-05-07 – 2020-05-08 (×3): 5 mg via ORAL
  Filled 2020-05-06 (×3): qty 1

## 2020-05-06 MED ORDER — SODIUM CHLORIDE 0.9% FLUSH: 3.0000 mL | INTRAVENOUS | Status: DC | PRN

## 2020-05-06 MED ORDER — SODIUM CHLORIDE 0.9 % IV BOLUS (SEPSIS)
250.0000 mL | Freq: Once | INTRAVENOUS | Status: AC
  Administered 2020-05-06: 250 mL via INTRAVENOUS

## 2020-05-06 MED ORDER — RENA-VITE PO TABS: 1.0000 | ORAL_TABLET | ORAL | Status: DC

## 2020-05-06 MED ORDER — PIPERACILLIN-TAZOBACTAM 3.375 G IVPB
3.3750 g | Freq: Two times a day (BID) | INTRAVENOUS | Status: DC
  Administered 2020-05-06 – 2020-05-07 (×3): 3.375 g via INTRAVENOUS
  Filled 2020-05-06 (×2): qty 50

## 2020-05-06 MED ORDER — PIPERACILLIN-TAZOBACTAM 3.375 G IVPB 30 MIN: 3.3750 g | Freq: Once | INTRAVENOUS | Status: DC

## 2020-05-06 MED ORDER — DIALYVITE 800 0.8 MG PO TABS: 0.8000 mg | ORAL_TABLET | Freq: Every day | ORAL | Status: DC

## 2020-05-06 MED ORDER — DIPHENHYDRAMINE HCL 25 MG PO CAPS: 25.0000 mg | ORAL_CAPSULE | Freq: Every day | ORAL | Status: DC | PRN

## 2020-05-06 MED ORDER — ONDANSETRON HCL 4 MG/2ML IJ SOLN
4.0000 mg | Freq: Once | INTRAMUSCULAR | Status: AC
  Administered 2020-05-06: 4 mg via INTRAVENOUS
  Filled 2020-05-06: qty 2

## 2020-05-06 MED ORDER — HYDRALAZINE HCL 25 MG PO TABS
50.0000 mg | ORAL_TABLET | Freq: Three times a day (TID) | ORAL | Status: DC
  Administered 2020-05-06 – 2020-05-08 (×6): 50 mg via ORAL
  Filled 2020-05-06 (×8): qty 2

## 2020-05-06 MED ORDER — ONDANSETRON HCL 4 MG PO TABS
4.0000 mg | ORAL_TABLET | Freq: Four times a day (QID) | ORAL | Status: DC | PRN
  Administered 2020-05-09: 4 mg via ORAL
  Filled 2020-05-06: qty 1

## 2020-05-06 MED ORDER — METOCLOPRAMIDE HCL 10 MG PO TABS
5.0000 mg | ORAL_TABLET | Freq: Four times a day (QID) | ORAL | Status: DC
  Administered 2020-05-06 – 2020-05-08 (×9): 5 mg via ORAL
  Filled 2020-05-06 (×10): qty 1

## 2020-05-06 MED ORDER — PROMETHAZINE HCL 25 MG/ML IJ SOLN: 25.0000 mg | Freq: Four times a day (QID) | INTRAMUSCULAR | Status: DC | PRN

## 2020-05-06 MED ORDER — ACETAMINOPHEN 325 MG PO TABS
650.0000 mg | ORAL_TABLET | Freq: Four times a day (QID) | ORAL | Status: DC | PRN
  Administered 2020-05-06 – 2020-05-08 (×2): 650 mg via ORAL
  Filled 2020-05-06 (×2): qty 2

## 2020-05-06 MED ORDER — FENTANYL CITRATE (PF) 100 MCG/2ML IJ SOLN
100.0000 ug | Freq: Once | INTRAMUSCULAR | Status: AC
  Administered 2020-05-06: 100 ug via INTRAVENOUS
  Filled 2020-05-06: qty 2

## 2020-05-06 MED ORDER — METOPROLOL SUCCINATE ER 50 MG PO TB24
50.0000 mg | ORAL_TABLET | Freq: Every day | ORAL | Status: DC
  Administered 2020-05-06 – 2020-05-07 (×2): 50 mg via ORAL
  Filled 2020-05-06 (×3): qty 1
  Filled 2020-05-06: qty 2

## 2020-05-06 MED ORDER — SODIUM CHLORIDE 0.9% FLUSH
3.0000 mL | Freq: Two times a day (BID) | INTRAVENOUS | Status: DC
  Administered 2020-05-06 – 2020-05-08 (×4): 3 mL via INTRAVENOUS

## 2020-05-06 MED ORDER — PANTOPRAZOLE SODIUM 40 MG IV SOLR
40.0000 mg | INTRAVENOUS | Status: DC
  Administered 2020-05-07 – 2020-05-08 (×2): 40 mg via INTRAVENOUS
  Filled 2020-05-06 (×4): qty 40

## 2020-05-06 MED ORDER — B COMPLEX-C PO TABS
1.0000 | ORAL_TABLET | Freq: Every day | ORAL | Status: DC
  Administered 2020-05-07: 1 via ORAL
  Filled 2020-05-06 (×2): qty 1

## 2020-05-06 NOTE — Consult Note (Signed)
[facility]  INPATIENT CONSULTATION  Reason for Consultation: ESRD and ongoing need for dialysis Requesting Provider: Dr. Manuella Ghazi  HPI: [name] is an 29 y.o. female with spina bifida with spinal/caudal regression syndrome, GERD, HTN, ESRD who is currently admitted for presumed UTI and nephrology is consulted for ongoing dialysis needs.    Recently dx with UTI earlier this month dc'd from ED with augmentin which was changed to IV abx with HD.  10/4 urine culture was neg here.  She presented with HA, nausea, fever with presumption this is ongoing UTI, UA certainly suggestive.  No dyspnea, orthopnea.   No issuse with AVF recently.   PMH: Past Medical History:  Diagnosis Date  . Anemia associated with chronic renal failure   . Blood transfusion   . 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  . Depression 05/14/2015  . Dialysis care   . ESRD (end stage renal disease) on dialysis ([place])   . GERD (gastroesophageal reflux disease) 01/07/2017  . HTN (hypertension) 05/02/2011  . Spina bifida   . UTI (lower urinary tract infection)    PSH: Past Surgical History:  Procedure Laterality Date  . AV FISTULA PLACEMENT     left arm    Past Medical History:  Diagnosis Date  . Anemia associated with chronic renal failure   . Blood transfusion   . 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  . Depression 05/14/2015  . Dialysis care   . ESRD (end stage renal disease) on dialysis ([place])   . GERD (gastroesophageal reflux disease) 01/07/2017  . HTN (hypertension) 05/02/2011  . Spina bifida   . UTI (lower urinary tract infection)     Medications:  I have reviewed the patient's current medications.  (Not in a hospital admission)   ALLERGIES:   Allergies  Allergen Reactions  . Ciprofloxacin Shortness Of Breath, Nausea And Vomiting and Other (See Comments)    HIGH FEVER and oral blisters   . Other Anaphylaxis    Revaclear dialzer  .  Peanut-Containing Drug Products Anaphylaxis  . Aleve [Naproxen Sodium] Other (See Comments)    G.I.Bleed  . Ceftriaxone Other (See Comments)    Blisters in mouth   . Coconut Oil Hives  . Influenza Vaccines Nausea And Vomiting and Other (See Comments)    High fever  . Tetanus Toxoid, Adsorbed Nausea And Vomiting and Other (See Comments)    HIGH FEVER, also  . Tetanus Toxoids Nausea And Vomiting and Other (See Comments)    HIGH FEVER  . Latex Itching and Rash    FAM HX: Family History  Problem Relation Age of Onset  . Kidney cancer Other   . Hypertension Maternal Grandmother   . Arthritis Maternal Grandmother   . Breast cancer Maternal Aunt   . Colon cancer Neg Hx     Social History:   reports that she has quit smoking. Her smoking use included cigarettes. She smoked 0.10 packs per day. She has never used smokeless tobacco. She reports that she does not drink alcohol and does not use drugs.  ROS: 12 system ROS per HPI above.  Blood pressure (!) 174/100, pulse (!) 119, temperature 99.3 F (37.4 C), temperature source Oral, resp. rate 16, weight 29.5 kg, SpO2 99 %. PHYSICAL EXAM: Gen: thin young woman who is uncomfortable  Eyes: anicteric, EOMI ENT: MMM Neck: supple CV: tachycardic regular Abd:  Suprapubic TTP Lungs: clear ant and laterally Extr: no legs, LUE AVF +t/b  Neuro: AOx3 Skin: warm and dry   Results for orders placed or performed during the hospital encounter of 05/06/20 (from the past 48 hour(s))  Lipase, blood     Status: None   Collection Time: 05/06/20 12:46 AM  Result Value Ref Range   Lipase 44 11 - 51 U/L    Comment: Performed at [facility], [address] [place], [place] [address]  Comprehensive metabolic panel     Status: Abnormal   Collection Time: 05/06/20 12:46 AM  Result Value Ref Range   Sodium 142 135 - 145 mmol/L   Potassium 3.7 3.5 - 5.1 mmol/L   Chloride 97 (L) 98 - 111 mmol/L   CO2 26 22 - 32 mmol/L   Glucose, Bld 90 70 - 99 mg/dL     Comment: Glucose reference range applies only to samples taken after fasting for at least 8 hours.   BUN 55 (H) 6 - 20 mg/dL   Creatinine, Ser 9.05 (H) 0.44 - 1.00 mg/dL   Calcium 9.7 8.9 - 10.3 mg/dL   Total Protein 6.9 6.5 - 8.1 g/dL   Albumin 3.6 3.5 - 5.0 g/dL   AST 22 15 - 41 U/L   ALT 18 0 - 44 U/L   Alkaline Phosphatase 46 38 - 126 U/L   Total Bilirubin 0.9 0.3 - 1.2 mg/dL   GFR, Estimated 5 (L) >60 mL/min   Anion gap 19 (H) 5 - 15    Comment: Performed at [facility], [address], [place], [place] [address]  CBC     Status: Abnormal   Collection Time: 05/06/20 12:46 AM  Result Value Ref Range   WBC 5.6 4.0 - 10.5 K/uL   RBC 3.42 (L) 3.87 - 5.11 MIL/uL   Hemoglobin 9.7 (L) 12.0 - 15.0 g/dL   HCT 31.5 (L) 36 - 46 %   MCV 92.1 80.0 - 100.0 fL   MCH 28.4 26.0 - 34.0 pg   MCHC 30.8 30.0 - 36.0 g/dL   RDW 17.9 (H) 11.5 - 15.5 %   Platelets 225 150 - 400 K/uL   nRBC 0.0 0.0 - 0.2 %    Comment: Performed at [facility], [address], [place], [place] [address]  Urinalysis, Routine w reflex microscopic     Status: Abnormal   Collection Time: 05/06/20 12:46 AM  Result Value Ref Range   Color, Urine YELLOW YELLOW   APPearance CLOUDY (A) CLEAR   Specific Gravity, Urine 1.009 1.005 - 1.030   pH 9.0 (H) 5.0 - 8.0   Glucose, UA 50 (A) NEGATIVE mg/dL   Hgb urine dipstick SMALL (A) NEGATIVE   Bilirubin Urine NEGATIVE NEGATIVE   Ketones, ur NEGATIVE NEGATIVE mg/dL   Protein, ur 100 (A) NEGATIVE mg/dL   Nitrite NEGATIVE NEGATIVE   Leukocytes,Ua LARGE (A) NEGATIVE   RBC / HPF 21-50 0 - 5 RBC/hpf   WBC, UA >50 (H) 0 - 5 WBC/hpf   Bacteria, UA FEW (A) NONE SEEN   Squamous Epithelial / LPF 6-10 0 - 5   WBC Clumps PRESENT     Comment: Performed at [facility], [address], [place], [place] [address]  Respiratory Panel by RT PCR (Flu A&B, Covid) - Nasopharyngeal Swab     Status: None   Collection Time: 05/06/20  6:05 AM   Specimen: Nasopharyngeal Swab  Result Value Ref  Range   SARS Coronavirus 2 by RT PCR NEGATIVE NEGATIVE    Comment: (NOTE) SARS-CoV-2 target nucleic acids are NOT  DETECTED.  The SARS-CoV-2 RNA is generally detectable in upper respiratoy specimens during the acute phase of infection. The lowest concentration of SARS-CoV-2 viral copies this assay can detect is 131 copies/mL. A negative result does not preclude SARS-Cov-2 infection and should not be used as the sole basis for treatment or other patient management decisions. A negative result may occur with  improper specimen collection/handling, submission of specimen other than nasopharyngeal swab, presence of viral mutation(s) within the areas targeted by this assay, and inadequate number of viral copies (<131 copies/mL). A negative result must be combined with clinical observations, patient history, and epidemiological information. The expected result is Negative.  Fact Sheet for Patients:  PinkCheek.be  Fact Sheet for Healthcare Providers:  GravelBags.it  This test is no t yet approved or cleared by the [place] FDA and  has been authorized for detection and/or diagnosis of SARS-CoV-2 by FDA under an Emergency Use Authorization (EUA). This EUA will remain  in effect (meaning this test can be used) for the duration of the COVID-19 declaration under Section 564(b)(1) of the Act, 21 U.S.C. section 360bbb-3(b)(1), unless the authorization is terminated or revoked sooner.     Influenza A by PCR NEGATIVE NEGATIVE   Influenza B by PCR NEGATIVE NEGATIVE    Comment: (NOTE) The Xpert Xpress SARS-CoV-2/FLU/RSV assay is intended as an aid in  the diagnosis of influenza from Nasopharyngeal swab specimens and  should not be used as a sole basis for treatment. Nasal washings and  aspirates are unacceptable for Xpert Xpress SARS-CoV-2/FLU/RSV  testing.  Fact Sheet for  Patients: PinkCheek.be  Fact Sheet for Healthcare Providers: GravelBags.it  This test is not yet approved or cleared by the [place] FDA and  has been authorized for detection and/or diagnosis of SARS-CoV-2 by  FDA under an Emergency Use Authorization (EUA). This EUA will remain  in effect (meaning this test can be used) for the duration of the  Covid-19 declaration under Section 564(b)(1) of the Act, 21  U.S.C. section 360bbb-3(b)(1), unless the authorization is  terminated or revoked. Performed at [facility], [address] [place], [place] [address]     No results found.   Assessment/Plan **Sepsis:  Possibly UTI based on symtoms and UA.  Cultures pending, on zosyn currently.   **ESRD: on HD TTS at [facility].  Missed Saturday - will plan to do HD today then resume outpt schedule.  I cannot access her outpt orders at the moment, will place in addendum when available.  **HTN:  On home meds, hypertensive currently, monitor.  **Anemia:  Mild, Hb 9.7; monitor.    **BMM: check phos if remains admitted; on home [place].   Justin Mend 05/06/2020, 12:58 PM

## 2020-05-06 NOTE — ED Notes (Signed)
Spoke with dialysis nurse, going to postpone until tomorrow

## 2020-05-06 NOTE — ED Notes (Signed)
Pt vomited x1.  

## 2020-05-06 NOTE — ED Triage Notes (Signed)
Pt from home via [facility]. Pt presents with c/o emesis and fever.

## 2020-05-06 NOTE — ED Provider Notes (Signed)
[facility] EMERGENCY DEPARTMENT Provider Note   CSN: [id number] Arrival date & time: 05/05/20  2326     History Chief Complaint  Patient presents with  . Emesis    [name] is a 29 y.o. female.  The history is provided by the patient.  Emesis Severity:  Moderate Timing:  Intermittent Progression:  Worsening Chronicity:  New Relieved by:  Nothing Worsened by:  Nothing Associated symptoms: fever and headaches   Patient with extensive history including spina bifida, end-stage renal disease on dialysis/hypertension, presents with fever and vomiting.  Patient reports she was admitted recently left the hospital early.  She now regrets leaving the hospital.  She was placed on antibiotics for UTI, but she reports her nephrologist changed this medication.  She is supposed to receive IV antibiotics during dialysis, she missed her last session.  Her last HD session was on October 7.  She did not receive dialysis on the ninth.  She thinks since she missed her last dose of antibiotic her symptoms are worsening.  She reports fever at home as well as vomiting.  She reports continued headache and neck pain.    Past Medical History:  Diagnosis Date  . Anemia associated with chronic renal failure   . Blood transfusion   . 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  . Depression 05/14/2015  . Dialysis care   . ESRD (end stage renal disease) on dialysis ([place])   . GERD (gastroesophageal reflux disease) 01/07/2017  . HTN (hypertension) 05/02/2011  . Spina bifida   . UTI (lower urinary tract infection)     Patient Active Problem List   Diagnosis Date Noted  . UTI (urinary tract infection) 04/29/2020  . Acute UTI 05/07/2018  . Tobacco use 05/07/2018  . Hypokalemia 05/07/2018  . GERD (gastroesophageal reflux disease) 01/07/2017  . Nausea with vomiting 01/07/2017  . Pyelonephritis 07/31/2015  . Back pain 07/31/2015  . Abdominal pain 07/31/2015  . Constipation 07/31/2015   . Major depressive disorder, single episode, severe without psychotic features ([place]) 05/15/2015  . Spina bifida ([place]) 01/18/2012  . ESRD (end stage renal disease) on dialysis ([place]) 05/02/2011  . Anemia 05/02/2011  . HTN (hypertension) 05/02/2011    Past Surgical History:  Procedure Laterality Date  . AV FISTULA PLACEMENT     left arm     OB History    Gravida  0   Para  0   Term  0   Preterm  0   AB  0   Living  0     SAB  0   TAB  0   Ectopic  0   Multiple  0   Live Births  0           Family History  Problem Relation Age of Onset  . Kidney cancer Other   . Hypertension Maternal Grandmother   . Arthritis Maternal Grandmother   . Breast cancer Maternal Aunt   . Colon cancer Neg Hx     Social History   Tobacco Use  . Smoking status: Former Smoker    Packs/day: 0.10    Types: Cigarettes  . Smokeless tobacco: Never Used  . Tobacco comment: 2 cigs a day  Vaping Use  . Vaping Use: Never used  Substance Use Topics  . Alcohol use: No  . Drug use: No    Home Medications Prior to Admission medications   Medication Sig Start Date End Date Taking? Authorizing Provider  acetaminophen (  TYLENOL) 500 MG tablet Take 1,000 mg by mouth every 6 (six) hours as needed for mild pain or headache.    [provider]  amLODipine (NORVASC) 5 MG tablet Take 5 mg by mouth at bedtime. 04/05/20   [provider]  amoxicillin-clavulanate (AUGMENTIN) 250-125 MG tablet Take 1 tablet by mouth daily for 6 days. 04/30/20 05/06/20  Oswald Hillock, MD  AURYXIA 1 GM 210 MG(Fe) tablet Take 2 tablets by mouth 3 (three) times daily with meals. 04/15/18   [provider]  B Complex-C-Folic Acid (DIALYVITE 409) 0.8 MG TABS Take 0.8 mg by mouth daily.    [provider]  diphenhydrAMINE (BENADRYL) 25 mg capsule Take 25 mg by mouth daily as needed for allergies.    [provider]  Epoetin Alfa (EPOGEN IJ) Inject as directed See admin  instructions. Every Tuesday, Thursday, Saturday    [provider]  hydrALAZINE (APRESOLINE) 50 MG tablet Take 50 mg by mouth 3 (three) times daily. 04/28/20   [provider]  metoCLOPramide (REGLAN) 5 MG tablet Take 1 tablet (5 mg total) by mouth 4 (four) times daily. Take one tablet 1-2 hours before dialysis 11/02/19   Doran Stabler, MD  metoprolol succinate (TOPROL-XL) 50 MG 24 hr tablet Take 50 mg by mouth daily. 12/29/19   [provider]  montelukast (SINGULAIR) 10 MG tablet Take 10 mg by mouth at bedtime. 04/27/20   [provider]  multivitamin (RENA-VIT) TABS tablet Take 1 tablet by mouth See admin instructions. Every Tuesday, Thursday, Saturday    [provider]  omeprazole (PRILOSEC) 20 MG capsule Take 20 mg by mouth daily.  03/26/16   [provider]    Allergies    Ciprofloxacin; Other; Peanut-containing drug products; Aleve [naproxen sodium]; Ceftriaxone; Coconut oil; Influenza vaccines; Tetanus toxoid, adsorbed; Tetanus toxoids; and Latex  Review of Systems   Review of Systems  Constitutional: Positive for fever.  Cardiovascular: Negative for chest pain.  Gastrointestinal: Positive for vomiting.  Neurological: Positive for headaches.  All other systems reviewed and are negative.   Physical Exam Updated Vital Signs BP (!) 170/89 (BP Location: Right Arm)   Pulse (!) 120   Temp 99.5 F (37.5 C) (Oral)   Resp 16   Wt 29.5 kg   SpO2 100%   BMI 79.34 kg/m   Physical Exam CONSTITUTIONAL: Chronically ill-appearing, no acute distress HEAD: Normocephalic/atraumatic EYES: EOMI/PERRL ENMT: Mucous membranes moist NECK: supple no meningeal signs SPINE/BACK:entire spine nontender CV: S1/S2 noted, tachycardic to 120s LUNGS: Lungs are clear to auscultation bilaterally, no apparent distress ABDOMEN: soft, nontender GU: Patient wearing diaper NEURO: Pt is awake/alert/appropriate no distress EXTREMITIES: Atrophic lower  extremities noted.  Dialysis access left arm with thrill noted SKIN: warm, color normal PSYCH: no abnormalities of mood noted, alert and oriented to situation  ED Results / Procedures / Treatments   Labs (all labs ordered are listed, but only abnormal results are displayed) Labs Reviewed  COMPREHENSIVE METABOLIC PANEL - Abnormal; Notable for the following components:      Result Value   Chloride 97 (*)    BUN 55 (*)    Creatinine, Ser 9.05 (*)    GFR, Estimated 5 (*)    Anion gap 19 (*)    All other components within normal limits  CBC - Abnormal; Notable for the following components:   RBC 3.42 (*)    Hemoglobin 9.7 (*)    HCT 31.5 (*)    RDW 17.9 (*)  All other components within normal limits  URINALYSIS, ROUTINE W REFLEX MICROSCOPIC - Abnormal; Notable for the following components:   APPearance CLOUDY (*)    pH 9.0 (*)    Glucose, UA 50 (*)    Hgb urine dipstick SMALL (*)    Protein, ur 100 (*)    Leukocytes,Ua LARGE (*)    WBC, UA >50 (*)    Bacteria, UA FEW (*)    All other components within normal limits  URINE CULTURE  RESPIRATORY PANEL BY RT PCR (FLU A&B, COVID)  LIPASE, BLOOD    EKG EKG Interpretation  Date/Time:  Sunday May 06 2020 06:14:25 EDT Ventricular Rate:  112 PR Interval:    QRS Duration: 73 QT Interval:  351 QTC Calculation: 480 R Axis:   37 Text Interpretation: Sinus tachycardia Borderline prolonged QT interval No significant change since last tracing Confirmed by Ripley Fraise [phone]) on 05/06/2020 6:19:35 AM   Radiology No results found.  Procedures Procedures  Medications Ordered in ED Medications  piperacillin-tazobactam (ZOSYN) IVPB 3.375 g (has no administration in time range)  fentaNYL (SUBLIMAZE) injection 100 mcg (100 mcg Intravenous Given 05/06/20 0532)  ondansetron (ZOFRAN) injection 4 mg (4 mg Intravenous Given 05/06/20 0532)  sodium chloride 0.9 % bolus 250 mL (0 mLs Intravenous Stopped 05/06/20 0646)    ED  Course  I have reviewed the triage vital signs and the nursing notes.  Pertinent labs & imaging results that were available during my care of the patient were reviewed by me and considered in my medical decision making (see chart for details).    MDM Rules/Calculators/A&P                          5:56 AM Patient with recent admission for UTI but she reports leaving early on October 4 She reports she is now having fevers and vomiting.  Patient is tachycardic but overall is in no distress.  She is chronically ill at baseline.  She does report missing dialysis recently.  Labs are pending this time.  Anticipate admission 7:02 AM Patient with recurrent UTI. Discussed with pharmacy, they recommend Zosyn as she has tolerated in the past Patient still with vomiting. She would need to be admitted to the hospital. Discussed with Dr. Josephine Cables for admission  Final Clinical Impression(s) / ED Diagnoses Final diagnoses:  Complicated UTI (urinary tract infection)  ESRD (end stage renal disease) [facility])    Rx / DC Orders ED Discharge Orders    None       Ripley Fraise, MD 05/06/20 [phone]

## 2020-05-06 NOTE — H&P (Signed)
History and Physical    [name] EQA:[id number] DOB: 1991-01-18 DOA: 05/06/2020  PCP: Lucianne Lei, MD   Patient coming from: Home  Chief Complaint: Fever/HA/Vomiting  HPI: [name] is a 29 y.o. female with medical history significant for ESRD on HD TTS, spina bifida with spinal/caudal regression syndrome, GERD, hypertension, depression, and recent UTI with discharge on 10/4 with ongoing symptoms who presented to the ED with complaints of fever, headaches, and vomiting.  She left early from the hospital on 10/4 when she was being treated for UTI and was discharged with Augmentin which was then switched over to an IV antibiotic with hemodialysis sessions.  It appears that she has not completed this course of antibiotics and actually missed her dialysis session on 10/9 with her last session being 10/7.  Her symptoms have been worsening and she states that she has had a temperature of 99 F as her highest reading.  She states that the symptoms of headache with nausea and vomiting are fairly typical for her when she has UTI.  She denies any photophobia, neck stiffness, chills/rigors, abdominal pain, chest pain, or shortness of breath.   ED Course: Vital signs with tachycardia noted and confirmed on EKG with sinus tachycardia at 112 bpm.  Laboratory data demonstrating hemoglobin 9.7 with BUN 55 and creatinine of 9.  She has been started on Zosyn for IV antibiotic treatment and continues to have some intermittent nausea and vomiting.  Urine cultures have been obtained and are pending.  Covid and influenza testing are negative.  Review of Systems: All others reviewed as noted above and otherwise negative  Past Medical History:  Diagnosis Date  . Anemia associated with chronic renal failure   . Blood transfusion   . 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  . Depression 05/14/2015  . Dialysis care   . ESRD (end stage renal disease) on dialysis ([place])   . GERD  (gastroesophageal reflux disease) 01/07/2017  . HTN (hypertension) 05/02/2011  . Spina bifida   . UTI (lower urinary tract infection)     Past Surgical History:  Procedure Laterality Date  . AV FISTULA PLACEMENT     left arm     reports that she has quit smoking. Her smoking use included cigarettes. She smoked 0.10 packs per day. She has never used smokeless tobacco. She reports that she does not drink alcohol and does not use drugs.  Allergies  Allergen Reactions  . Ciprofloxacin Shortness Of Breath, Nausea And Vomiting and Other (See Comments)    HIGH FEVER and oral blisters   . Other Anaphylaxis    Revaclear dialzer  . Peanut-Containing Drug Products Anaphylaxis  . Aleve [Naproxen Sodium] Other (See Comments)    G.I.Bleed  . Ceftriaxone Other (See Comments)    Blisters in mouth   . Coconut Oil Hives  . Influenza Vaccines Nausea And Vomiting and Other (See Comments)    High fever  . Tetanus Toxoid, Adsorbed Nausea And Vomiting and Other (See Comments)    HIGH FEVER, also  . Tetanus Toxoids Nausea And Vomiting and Other (See Comments)    HIGH FEVER  . Latex Itching and Rash    Family History  Problem Relation Age of Onset  . Kidney cancer Other   . Hypertension Maternal Grandmother   . Arthritis Maternal Grandmother   . Breast cancer Maternal Aunt   . Colon cancer Neg Hx     Prior to Admission medications   Medication Sig  Start Date End Date Taking? Authorizing Provider  acetaminophen (TYLENOL) 500 MG tablet Take 1,000 mg by mouth every 6 (six) hours as needed for mild pain or headache.    [provider]  amLODipine (NORVASC) 5 MG tablet Take 5 mg by mouth at bedtime. 04/05/20   [provider]  amoxicillin-clavulanate (AUGMENTIN) 500-125 MG tablet Take 1 tablet by mouth daily. 05/01/20 #6DS 05/01/20   [provider]  AURYXIA 1 GM 210 MG(Fe) tablet Take 2 tablets by mouth 3 (three) times daily with meals. 04/15/18   [provider]  B  Complex-C-Folic Acid (DIALYVITE 419) 0.8 MG TABS Take 0.8 mg by mouth daily.    [provider]  diphenhydrAMINE (BENADRYL) 25 mg capsule Take 25 mg by mouth daily as needed for allergies.    [provider]  Epoetin Alfa (EPOGEN IJ) Inject as directed See admin instructions. Every Tuesday, Thursday, Saturday    [provider]  hydrALAZINE (APRESOLINE) 50 MG tablet Take 50 mg by mouth 3 (three) times daily. 04/28/20   [provider]  metoCLOPramide (REGLAN) 5 MG tablet Take 1 tablet (5 mg total) by mouth 4 (four) times daily. Take one tablet 1-2 hours before dialysis 11/02/19   Doran Stabler, MD  metoprolol succinate (TOPROL-XL) 50 MG 24 hr tablet Take 50 mg by mouth daily. 12/29/19   [provider]  montelukast (SINGULAIR) 10 MG tablet Take 10 mg by mouth at bedtime. 04/27/20   [provider]  multivitamin (RENA-VIT) TABS tablet Take 1 tablet by mouth See admin instructions. Every Tuesday, Thursday, Saturday    [provider]  omeprazole (PRILOSEC) 20 MG capsule Take 20 mg by mouth daily.  03/26/16   [provider]    Physical Exam: Vitals:   05/06/20 0045 05/06/20 0046 05/06/20 0530  BP: (!) 173/88  (!) 170/89  Pulse: (!) 120  (!) 120  Resp: 18  16  Temp: 99.5 F (37.5 C)    TempSrc: Oral    SpO2: 100%  100%  Weight:  29.5 kg     Constitutional: Minimal distress Vitals:   05/06/20 0045 05/06/20 0046 05/06/20 0530  BP: (!) 173/88  (!) 170/89  Pulse: (!) 120  (!) 120  Resp: 18  16  Temp: 99.5 F (37.5 C)    TempSrc: Oral    SpO2: 100%  100%  Weight:  29.5 kg    Eyes: lids and conjunctivae normal ENMT: Mucous membranes are moist.  Neck: normal, supple Respiratory: clear to auscultation bilaterally. Normal respiratory effort. No accessory muscle use.  Currently on room air. Cardiovascular: Regular rate and rhythm, no murmurs. No extremity edema.  Tachycardic. Abdomen: no tenderness, no distention. Bowel  sounds positive.  Musculoskeletal: Lower extremities with no significant findings, significant muscle wasting and atrophy related to disease process Skin: no rashes, lesions, ulcers.  Psychiatric: Flat affect  Labs on Admission: I have personally reviewed following labs and imaging studies  CBC: Recent Labs  Lab 04/29/20 1720 04/30/20 0455 05/06/20 0046  WBC 6.2 6.7 5.6  NEUTROABS 4.1  --   --   HGB 11.2* 9.9* 9.7*  HCT 37.5 32.8* 31.5*  MCV 92.6 93.4 92.1  PLT 267 255 622   Basic Metabolic Panel: Recent Labs  Lab 04/29/20 1720 04/30/20 0455 05/06/20 0046  NA 143 144 142  K 3.2* 3.2* 3.7  CL 96* 100 97*  CO2 30 26 26   GLUCOSE 94 71 90  BUN 38* 41* 55*  CREATININE  6.17* 6.86* 9.05*  CALCIUM 9.8 9.3 9.7   GFR: CrCl cannot be calculated (Unknown ideal weight.). Liver Function Tests: Recent Labs  Lab 04/30/20 0455 05/06/20 0046  AST 15 22  ALT 15 18  ALKPHOS 46 46  BILITOT 0.8 0.9  PROT 6.4* 6.9  ALBUMIN 3.3* 3.6   Recent Labs  Lab 05/06/20 0046  LIPASE 44   No results for input(s): AMMONIA in the last 168 hours. Coagulation Profile: No results for input(s): INR, PROTIME in the last 168 hours. Cardiac Enzymes: No results for input(s): CKTOTAL, CKMB, CKMBINDEX, TROPONINI in the last 168 hours. BNP (last 3 results) No results for input(s): PROBNP in the last 8760 hours. HbA1C: No results for input(s): HGBA1C in the last 72 hours. CBG: No results for input(s): GLUCAP in the last 168 hours. Lipid Profile: No results for input(s): CHOL, HDL, LDLCALC, TRIG, CHOLHDL, LDLDIRECT in the last 72 hours. Thyroid Function Tests: No results for input(s): TSH, T4TOTAL, FREET4, T3FREE, THYROIDAB in the last 72 hours. Anemia Panel: No results for input(s): VITAMINB12, FOLATE, FERRITIN, TIBC, IRON, RETICCTPCT in the last 72 hours. Urine analysis:    Component Value Date/Time   COLORURINE YELLOW 05/06/2020 0046   APPEARANCEUR CLOUDY (A) 05/06/2020 0046   LABSPEC  1.009 05/06/2020 0046   PHURINE 9.0 (H) 05/06/2020 0046   GLUCOSEU 50 (A) 05/06/2020 0046   HGBUR SMALL (A) 05/06/2020 0046   BILIRUBINUR NEGATIVE 05/06/2020 0046   KETONESUR NEGATIVE 05/06/2020 0046   PROTEINUR 100 (A) 05/06/2020 0046   UROBILINOGEN 0.2 05/11/2015 0906   NITRITE NEGATIVE 05/06/2020 0046   LEUKOCYTESUR LARGE (A) 05/06/2020 0046    Radiological Exams on Admission: No results found.  EKG: Independently reviewed. ST 112 bpm.  Assessment/Plan Active Problems:   UTI (urinary tract infection)    UTI -With usual associated symptoms of headache, nausea/vomiting -Urine cultures obtained and pending -Prior cultures with Pseudomonas noted and patient has been started on Zosyn for treatment  Intractable nausea and vomiting related to above -Started on IV PPI daily -Clear liquid diet and advance as tolerated -Previously noted to be related to some constipation, therefore will start on MiraLAX and Senokot daily, may need Linzess  ESRD on HD TTS -Missed last hemodialysis session on 10/9 -No significant volume overload or lab abnormalities noted -Nephrology consulted for dialysis  Essential hypertension -Continue home metoprolol, hydralazine, and amlodipine  Spinal/caudal regression syndrome with spina bifida -PT evaluation  DVT prophylaxis: Heparin Code Status: Full Family Communication: None at bedside Disposition Plan:Admit for UTI tx and HD Consults called:Nephrology Admission status: Obs, Med/Surg   Suki Crockett D Manuella Ghazi DO [facility]  If 7PM-7AM, please contact night-coverage [email or web address]  05/06/2020, 7:57 AM

## 2020-05-06 NOTE — Progress Notes (Signed)
Pharmacy Antibiotic Note  [name] is a 29 y.o. female dialysis patient admitted on 05/06/2020 with UTI.  Pharmacy has been consulted for Zosyn dosing.    Plan: Start Zosyn 3.375g IV q12h (4-hr infusion)  Pharmacy will continue to monitor renal function, cultures and patient progress.   Weight: 29.5 kg (65 lb)  Temp (24hrs), Avg:99.4 F (37.4 C), Min:99.3 F (37.4 C), Max:99.5 F (37.5 C)  Recent Labs  Lab 04/29/20 1720 04/30/20 0455 05/06/20 0046  WBC 6.2 6.7 5.6  CREATININE 6.17* 6.86* 9.05*    CrCl cannot be calculated (Unknown ideal weight.).     Antimicrobials this admission: Zosyn 10/10>>     Dose adjustments this admission: Zosyn  Microbiology results:  10/10 UCx:    10/10 Resp PCR: SARS CoV-2 negative; Flu A/B negative    Thank you for allowing pharmacy to be a part of this patient's care.  Despina Pole 05/06/2020 4:07 PM

## 2020-05-06 NOTE — Progress Notes (Signed)
    NEPHROLOGY NURSING NOTE:   After d/w Dr. Johnney Ou, HD is rescheduled to tomorrow to lighten TTS patient case load.  Rockwell Alexandria, RN

## 2020-05-07 DIAGNOSIS — D631 Anemia in chronic kidney disease: Secondary | ICD-10-CM | POA: Diagnosis not present

## 2020-05-07 DIAGNOSIS — N186 End stage renal disease: Secondary | ICD-10-CM | POA: Diagnosis not present

## 2020-05-07 DIAGNOSIS — Z992 Dependence on renal dialysis: Secondary | ICD-10-CM | POA: Diagnosis not present

## 2020-05-07 DIAGNOSIS — I120 Hypertensive chronic kidney disease with stage 5 chronic kidney disease or end stage renal disease: Secondary | ICD-10-CM | POA: Diagnosis not present

## 2020-05-07 DIAGNOSIS — A419 Sepsis, unspecified organism: Secondary | ICD-10-CM | POA: Diagnosis not present

## 2020-05-07 LAB — MAGNESIUM: Magnesium: 2.7 mg/dL — ABNORMAL HIGH (ref 1.7–2.4)

## 2020-05-07 LAB — BASIC METABOLIC PANEL
Anion gap: 19 — ABNORMAL HIGH (ref 5–15)
BUN: 60 mg/dL — ABNORMAL HIGH (ref 6–20)
CO2: 26 mmol/L (ref 22–32)
Calcium: 8.7 mg/dL — ABNORMAL LOW (ref 8.9–10.3)
Chloride: 92 mmol/L — ABNORMAL LOW (ref 98–111)
Creatinine, Ser: 10.41 mg/dL — ABNORMAL HIGH (ref 0.44–1.00)
GFR, Estimated: 5 mL/min — ABNORMAL LOW (ref >60)
Glucose, Bld: 73 mg/dL (ref 70–99)
Potassium: 3.9 mmol/L (ref 3.5–5.1)
Sodium: 137 mmol/L (ref 135–145)

## 2020-05-07 LAB — URINE CULTURE: Culture: NO GROWTH

## 2020-05-07 LAB — CBC
HCT: 30.1 % — ABNORMAL LOW (ref 36.0–46.0)
Hemoglobin: 9.1 g/dL — ABNORMAL LOW (ref 12.0–15.0)
MCH: 27.7 pg (ref 26.0–34.0)
MCHC: 30.2 g/dL (ref 30.0–36.0)
MCV: 91.8 fL (ref 80.0–100.0)
Platelets: 221 K/uL (ref 150–400)
RBC: 3.28 MIL/uL — ABNORMAL LOW (ref 3.87–5.11)
RDW: 17.3 % — ABNORMAL HIGH (ref 11.5–15.5)
WBC: 4.1 K/uL (ref 4.0–10.5)
nRBC: 0 % (ref 0.0–0.2)

## 2020-05-07 MED ORDER — AMOXICILLIN-POT CLAVULANATE 500-125 MG PO TABS: 1.0000 | ORAL_TABLET | Freq: Every day | ORAL | 0 refills | Status: AC

## 2020-05-07 NOTE — Anesthesia Preprocedure Evaluation (Addendum)
Anesthesia Evaluation  Patient identified by MRN, date of birth, ID band Patient awake    Reviewed: Allergy & Precautions, NPO status , Patient's Chart, lab work & pertinent test results  History of Anesthesia Complications Negative for: history of anesthetic complications  Airway Mallampati: II  TM Distance: >3 FB Neck ROM: Full    Dental  (+) Dental Advisory Given, Teeth Intact   Pulmonary neg pulmonary ROS, former smoker,    Pulmonary exam normal breath sounds clear to auscultation       Cardiovascular hypertension, Pt. on medications Normal cardiovascular exam Rhythm:Regular Rate:Normal     Neuro/Psych PSYCHIATRIC DISORDERS Depression Spina bifida   Neuromuscular disease    GI/Hepatic GERD  ,  Endo/Other    Renal/GU ESRF and DialysisRenal disease     Musculoskeletal Spina bifida    Abdominal   Peds  Hematology  (+) anemia ,   Anesthesia Other Findings   Reproductive/Obstetrics                            Anesthesia Physical Anesthesia Plan  ASA: III  Anesthesia Plan: General   Post-op Pain Management:    Induction: Intravenous  PONV Risk Score and Plan: Treatment may vary due to age or medical condition, Propofol infusion and Ondansetron  Airway Management Planned: Nasal Cannula, Natural Airway and Simple Face Mask  Additional Equipment:   Intra-op Plan:   Post-operative Plan: Extubation in OR  Informed Consent: I have reviewed the patients History and Physical, chart, labs and discussed the procedure including the risks, benefits and alternatives for the proposed anesthesia with the patient or authorized representative who has indicated his/her understanding and acceptance.     Dental advisory given  Plan Discussed with:   Anesthesia Plan Comments: (Possible GA with LMA)        Anesthesia Quick Evaluation

## 2020-05-07 NOTE — Procedures (Signed)
 .    HEMODIALYSIS TREATMENT NOTE:   Pt reports she had fistulogram of AVF on 10/8 and "everything was fine" with her access.  She did in fact go to outpatient HD the next day, "was stuck four times and they kept pulling clots" and was unable to dialyze.  Arterial end of AVF was cannulated without difficulty.  Venous end was cannulated above prior cannulation sites.  Unable to fully advance needle without cessation of flash blood return.  Needle was withdrawn 2cm and secured, flushed without resistance but pt c/o stinging at the site.  She reluctantly agreed to allow me to cannulate a new area of her AVF closer to her shoulder.  It felt well-developed and was easily cannulated with a 17g needle.  The needle line was flushed repeatedly with NS.  No stinging or swelling noted.    HD was initiated at Qb 100 and VP 60.  At Qb 150 pt reported stinging and VP was noted to be 120.  Pt became tearful and said, "this isn't gonna work."  Blood was slowly returned via arterial needle.  Dr. Marval Regal was notified and will consult Dr. Constance Haw for [facility] placement.  Prolonged bleeding from all needle sites: hemostasis was achieved in 30 minutes.  Pt stated during this time that she was "so tired of all of this."  I asked if she referred to access issues or something else.  She said, "All of it.  Dialysis.  All of it."  Her feelings of frustration were acknowledged and validated and we eventually came to the subject of palliative care.  She would be open to discussing this further with a PCM team member.  Rockwell Alexandria, RN

## 2020-05-07 NOTE — Progress Notes (Signed)
Patient requested to be able to self catheterize.  Patient states she does the same at home.  Retrieved order from nurse practitioner for I&O cath.  Gave patient supplies.  Patient self-catherterized herself and 150cc of yellow urine returned.

## 2020-05-07 NOTE — Discharge Summary (Signed)
Physician Discharge Summary  [name]:[id number] DOB: 1990/10/11 DOA: 05/06/2020  PCP: Lucianne Lei, MD  Admit date: 05/06/2020  Discharge date: 05/07/2020  Admitted From:Home  Disposition:  Home  Recommendations for Outpatient Follow-up:  1. Follow up with PCP in 1-2 weeks 2. Please obtain BMP/CBC in one week 3. Continue hemodialysis schedule as usual starting 10/12 4. Continue Augmentin as prescribed for 5 more days to complete course of treatment for UTI.  No growth noted on urine culture during this admission, but previously noted to have [place]: None  Equipment/Devices: None  Discharge Condition: Stable  CODE STATUS: Full  Diet recommendation: Renal diet  Brief/Interim Summary: Per HPI: [name] is a 29 y.o. female with medical history significant for ESRD on HD TTS, spina bifida with spinal/caudal regression syndrome, GERD, hypertension, depression, and recent UTI with discharge on 10/4 with ongoing symptoms who presented to the ED with complaints of fever, headaches, and vomiting.  She left early from the hospital on 10/4 when she was being treated for UTI and was discharged with Augmentin which was then switched over to an IV antibiotic with hemodialysis sessions.  It appears that she has not completed this course of antibiotics and actually missed her dialysis session on 10/9 with her last session being 10/7.  Her symptoms have been worsening and she states that she has had a temperature of 99 F as her highest reading.  She states that the symptoms of headache with nausea and vomiting are fairly typical for her when she has UTI.  She denies any photophobia, neck stiffness, chills/rigors, abdominal pain, chest pain, or shortness of breath.  10/11: Patient was admitted with UTI and related intractable nausea and vomiting.  She was started on Zosyn for treatment with noted prior cultures of Pseudomonas and what appeared to be failed  outpatient treatment.  On this day of discharge, she will have hemodialysis and then will resume her usual schedule as she had missed a session late last week.  She is overall feeling much better and tolerating diet with no other concerns noted.  She will continue on Augmentin as prescribed to complete course of treatment for her UTI.  No other concerns noted this admission and she is overall stable for discharge.  Discharge Diagnoses:  Active Problems:   UTI (urinary tract infection)    Discharge Instructions  Discharge Instructions    Diet - low sodium heart healthy   Complete by: As directed    Increase activity slowly   Complete by: As directed      Allergies as of 05/07/2020      Reactions   Ciprofloxacin Shortness Of Breath, Nausea And Vomiting, Other (See Comments)   HIGH FEVER and oral blisters    Other Anaphylaxis   Revaclear dialzer   Peanut-containing Drug Products Anaphylaxis   Aleve [naproxen Sodium] Other (See Comments)   G.I.Bleed   Ceftriaxone Other (See Comments)   Blisters in mouth   Coconut Oil Hives   Influenza Vaccines Nausea And Vomiting, Other (See Comments)   High fever   Tetanus Toxoid, Adsorbed Nausea And Vomiting, Other (See Comments)   HIGH FEVER, also   Tetanus Toxoids Nausea And Vomiting, Other (See Comments)   HIGH FEVER   Latex Itching, Rash      Medication List    TAKE these medications   acetaminophen 500 MG tablet Commonly known as: TYLENOL Take 1,000 mg by mouth every 6 (six) hours as needed for mild pain  or headache.   amLODipine 5 MG tablet Commonly known as: NORVASC Take 5 mg by mouth at bedtime.   amoxicillin-clavulanate 500-125 MG tablet Commonly known as: AUGMENTIN Take 1 tablet (500 mg total) by mouth daily for 5 days. 05/01/20 #6DS   Auryxia 1 GM 210 MG(Fe) tablet Generic drug: ferric citrate Take 2 tablets by mouth 3 (three) times daily with meals.   diphenhydrAMINE 25 mg capsule Commonly known as: BENADRYL Take 25  mg by mouth daily as needed for allergies.   EPOGEN IJ Inject as directed See admin instructions. Every Tuesday, Thursday, Saturday   hydrALAZINE 50 MG tablet Commonly known as: APRESOLINE Take 50 mg by mouth 3 (three) times daily.   metoCLOPramide 5 MG tablet Commonly known as: Reglan Take 1 tablet (5 mg total) by mouth 4 (four) times daily. Take one tablet 1-2 hours before dialysis   metoprolol succinate 50 MG 24 hr tablet Commonly known as: TOPROL-XL Take 50 mg by mouth daily.   montelukast 10 MG tablet Commonly known as: SINGULAIR Take 10 mg by mouth at bedtime.   multivitamin Tabs tablet Take 1 tablet by mouth See admin instructions. Every Tuesday, Thursday, Saturday   Dialyvite 800 0.8 MG Tabs Take 0.8 mg by mouth daily.   omeprazole 20 MG capsule Commonly known as: PRILOSEC Take 20 mg by mouth daily.       Follow-up Information    Lucianne Lei, MD Follow up in 1 week(s).   Specialty: Family Medicine Contact information: [address] [place] [address] [phone]              Allergies  Allergen Reactions  . Ciprofloxacin Shortness Of Breath, Nausea And Vomiting and Other (See Comments)    HIGH FEVER and oral blisters   . Other Anaphylaxis    Revaclear dialzer  . Peanut-Containing Drug Products Anaphylaxis  . Aleve [Naproxen Sodium] Other (See Comments)    G.I.Bleed  . Ceftriaxone Other (See Comments)    Blisters in mouth   . Coconut Oil Hives  . Influenza Vaccines Nausea And Vomiting and Other (See Comments)    High fever  . Tetanus Toxoid, Adsorbed Nausea And Vomiting and Other (See Comments)    HIGH FEVER, also  . Tetanus Toxoids Nausea And Vomiting and Other (See Comments)    HIGH FEVER  . Latex Itching and Rash    Consultations:  Nephrology   Procedures/Studies: CT Head Wo Contrast  Result Date: 04/29/2020 CLINICAL DATA:  Headache for 3 days with nausea and vomiting EXAM: CT HEAD WITHOUT CONTRAST TECHNIQUE: Contiguous  axial images were obtained from the base of the skull through the vertex without intravenous contrast. COMPARISON:  05/07/2018 FINDINGS: Brain: There is no mass, hemorrhage or extra-axial collection. The size and configuration of the ventricles and extra-axial CSF spaces are normal. The brain parenchyma is normal, without acute or chronic infarction. Vascular: No abnormal hyperdensity of the major intracranial arteries or dural venous sinuses. No intracranial atherosclerosis. Skull: The visualized skull base, calvarium and extracranial soft tissues are normal. Sinuses/Orbits: No fluid levels or advanced mucosal thickening of the visualized paranasal sinuses. No mastoid or middle ear effusion. The orbits are normal. IMPRESSION: Normal head CT. Electronically Signed   By: Ulyses Jarred M.D.   On: 04/29/2020 22:00     Discharge Exam: Vitals:   05/06/20 2045 05/07/20 0548  BP: 137/82 124/82  Pulse: (!) 106 84  Resp: 18 16  Temp: 98.6 F (37 C) 98.3 F (36.8 C)  SpO2: 100% 100%   Vitals:   05/06/20 1651 05/06/20 1819 05/06/20 2045 05/07/20 0548  BP: 132/71 (!) 152/87 137/82 124/82  Pulse: (!) 101 99 (!) 106 84  Resp: 18 16 18 16   Temp:  99.1 F (37.3 C) 98.6 F (37 C) 98.3 F (36.8 C)  TempSrc:  Oral Oral Oral  SpO2: 98% 100% 100% 100%  Weight:        General: Pt is alert, awake, not in acute distress, spina bifid with caudal regression and bilateral lower extremity atrophy Cardiovascular: RRR, S1/S2 +, no rubs, no gallops Respiratory: CTA bilaterally, no wheezing, no rhonchi Abdominal: Soft, NT, ND, bowel sounds + Extremities: no edema, no cyanosis    The results of significant diagnostics from this hospitalization (including imaging, microbiology, ancillary and laboratory) are listed below for reference.     Microbiology: Recent Results (from the past 240 hour(s))  Urine Culture     Status: None   Collection Time: 04/29/20  9:29 PM   Specimen: Urine, Catheterized  Result  Value Ref Range Status   Specimen Description   Final    URINE, CATHETERIZED Performed at [facility], [address] [place], [place] [address]    Special Requests   Final    NONE Performed at [facility], [address] [place], [place] [address]    Culture   Final    NO GROWTH Performed at [facility], [place] [address], [place], [place] [address]    Report Status 05/01/2020 FINAL  Final  Respiratory Panel by RT PCR (Flu A&B, Covid) - Nasopharyngeal Swab     Status: None   Collection Time: 04/29/20 11:38 PM   Specimen: Nasopharyngeal Swab  Result Value Ref Range Status   SARS Coronavirus 2 by RT PCR NEGATIVE NEGATIVE Final    Comment: (NOTE) SARS-CoV-2 target nucleic acids are NOT DETECTED.  The SARS-CoV-2 RNA is generally detectable in upper respiratoy specimens during the acute phase of infection. The lowest concentration of SARS-CoV-2 viral copies this assay can detect is 131 copies/mL. A negative result does not preclude SARS-Cov-2 infection and should not be used as the sole basis for treatment or other patient management decisions. A negative result may occur with  improper specimen collection/handling, submission of specimen other than nasopharyngeal swab, presence of viral mutation(s) within the areas targeted by this assay, and inadequate number of viral copies (<131 copies/mL). A negative result must be combined with clinical observations, patient history, and epidemiological information. The expected result is Negative.  Fact Sheet for Patients:  PinkCheek.be  Fact Sheet for Healthcare Providers:  GravelBags.it  This test is no t yet approved or cleared by the [place] FDA and  has been authorized for detection and/or diagnosis of SARS-CoV-2 by FDA under an Emergency Use Authorization (EUA). This EUA will remain  in effect (meaning this test can be used) for the duration of the COVID-19  declaration under Section 564(b)(1) of the Act, 21 U.S.C. section 360bbb-3(b)(1), unless the authorization is terminated or revoked sooner.     Influenza A by PCR NEGATIVE NEGATIVE Final   Influenza B by PCR NEGATIVE NEGATIVE Final    Comment: (NOTE) The Xpert Xpress SARS-CoV-2/FLU/RSV assay is intended as an aid in  the diagnosis of influenza from Nasopharyngeal swab specimens and  should not be used as a sole basis for treatment. Nasal washings and  aspirates are unacceptable for Xpert Xpress SARS-CoV-2/FLU/RSV  testing.  Fact Sheet for Patients: PinkCheek.be  Fact Sheet for Healthcare  Providers: GravelBags.it  This test is not yet approved or cleared by the [place] and  has been authorized for detection and/or diagnosis of SARS-CoV-2 by  FDA under an Emergency Use Authorization (EUA). This EUA will remain  in effect (meaning this test can be used) for the duration of the  Covid-19 declaration under Section 564(b)(1) of the Act, 21  U.S.C. section 360bbb-3(b)(1), unless the authorization is  terminated or revoked. Performed at [facility], [address] [place], [place] [address]   Urine culture     Status: None   Collection Time: 05/06/20  5:27 AM   Specimen: Urine, Catheterized  Result Value Ref Range Status   Specimen Description   Final    URINE, CATHETERIZED Performed at  [facility], [address] [place], [place] [address]    Special Requests   Final    NONE Performed at [facility], [address] [place], [place] [address]    Culture   Final    NO GROWTH Performed at [facility], [place] [address] [place],  [address]    Report Status 05/07/2020 FINAL  Final  Respiratory Panel by RT PCR (Flu A&B, Covid) - Nasopharyngeal Swab     Status: None   Collection Time: 05/06/20  6:05 AM   Specimen: Nasopharyngeal Swab  Result Value Ref Range Status   SARS Coronavirus 2 by RT PCR  NEGATIVE NEGATIVE Final    Comment: (NOTE) SARS-CoV-2 target nucleic acids are NOT DETECTED.  The SARS-CoV-2 RNA is generally detectable in upper respiratoy specimens during the acute phase of infection. The lowest concentration of SARS-CoV-2 viral copies this assay can detect is 131 copies/mL. A negative result does not preclude SARS-Cov-2 infection and should not be used as the sole basis for treatment or other patient management decisions. A negative result may occur with  improper specimen collection/handling, submission of specimen other than nasopharyngeal swab, presence of viral mutation(s) within the areas targeted by this assay, and inadequate number of viral copies (<131 copies/mL). A negative result must be combined with clinical observations, patient history, and epidemiological information. The expected result is Negative.  Fact Sheet for Patients:  PinkCheek.be  Fact Sheet for Healthcare Providers:  GravelBags.it  This test is no t yet approved or cleared by the [place] FDA and  has been authorized for detection and/or diagnosis of SARS-CoV-2 by FDA under an Emergency Use Authorization (EUA). This EUA will remain  in effect (meaning this test can be used) for the duration of the COVID-19 declaration under Section 564(b)(1) of the Act, 21 U.S.C. section 360bbb-3(b)(1), unless the authorization is terminated or revoked sooner.     Influenza A by PCR NEGATIVE NEGATIVE Final   Influenza B by PCR NEGATIVE NEGATIVE Final    Comment: (NOTE) The Xpert Xpress SARS-CoV-2/FLU/RSV assay is intended as an aid in  the diagnosis of influenza from Nasopharyngeal swab specimens and  should not be used as a sole basis for treatment. Nasal washings and  aspirates are unacceptable for Xpert Xpress SARS-CoV-2/FLU/RSV  testing.  Fact Sheet for Patients: PinkCheek.be  Fact Sheet for  Healthcare Providers: GravelBags.it  This test is not yet approved or cleared by the [place] FDA and  has been authorized for detection and/or diagnosis of SARS-CoV-2 by  FDA under an Emergency Use Authorization (EUA). This EUA will remain  in effect (meaning this test can be used) for the duration of the  Covid-19 declaration under Section 564(b)(1) of the Act, 21  U.S.C. section 360bbb-3(b)(1), unless the authorization is  terminated or revoked. Performed at [facility], [address], [place], [place] [address]      Labs: BNP (last 3 results) No results for input(s): BNP in the last 8760 hours. Basic Metabolic Panel: Recent Labs  Lab 05/06/20 0046 05/07/20 0607  NA 142 137  K 3.7 3.9  CL 97* 92*  CO2 26 26  GLUCOSE 90 73  BUN 55* 60*  CREATININE 9.05* 10.41*  CALCIUM 9.7 8.7*  MG  --  2.7*   Liver Function Tests: Recent Labs  Lab 05/06/20 0046  AST 22  ALT 18  ALKPHOS 46  BILITOT 0.9  PROT 6.9  ALBUMIN 3.6   Recent Labs  Lab 05/06/20 0046  LIPASE 44   No results for input(s): AMMONIA in the last 168 hours. CBC: Recent Labs  Lab 05/06/20 0046 05/07/20 0607  WBC 5.6 4.1  HGB 9.7* 9.1*  HCT 31.5* 30.1*  MCV 92.1 91.8  PLT 225 221   Cardiac Enzymes: No results for input(s): CKTOTAL, CKMB, CKMBINDEX, TROPONINI in the last 168 hours. BNP: Invalid input(s): POCBNP CBG: No results for input(s): GLUCAP in the last 168 hours. D-Dimer No results for input(s): DDIMER in the last 72 hours. Hgb A1c No results for input(s): HGBA1C in the last 72 hours. Lipid Profile No results for input(s): CHOL, HDL, LDLCALC, TRIG, CHOLHDL, LDLDIRECT in the last 72 hours. Thyroid function studies No results for input(s): TSH, T4TOTAL, T3FREE, THYROIDAB in the last 72 hours.  Invalid input(s): FREET3 Anemia work up No results for input(s): VITAMINB12, FOLATE, FERRITIN, TIBC, IRON, RETICCTPCT in the last 72 hours. Urinalysis     Component Value Date/Time   COLORURINE YELLOW 05/06/2020 0046   APPEARANCEUR CLOUDY (A) 05/06/2020 0046   LABSPEC 1.009 05/06/2020 0046   PHURINE 9.0 (H) 05/06/2020 0046   GLUCOSEU 50 (A) 05/06/2020 0046   HGBUR SMALL (A) 05/06/2020 0046   BILIRUBINUR NEGATIVE 05/06/2020 0046   KETONESUR NEGATIVE 05/06/2020 0046   PROTEINUR 100 (A) 05/06/2020 0046   UROBILINOGEN 0.2 05/11/2015 0906   NITRITE NEGATIVE 05/06/2020 0046   LEUKOCYTESUR LARGE (A) 05/06/2020 0046   Sepsis Labs Invalid input(s): PROCALCITONIN,  WBC,  LACTICIDVEN Microbiology Recent Results (from the past 240 hour(s))  Urine Culture     Status: None   Collection Time: 04/29/20  9:29 PM   Specimen: Urine, Catheterized  Result Value Ref Range Status   Specimen Description   Final    URINE, CATHETERIZED Performed at [facility], [address], [place], [place] [address]    Special Requests   Final    NONE Performed at [facility], [address], [place], [place] [address]    Culture   Final    NO GROWTH Performed at [facility], [place] [address] [place], [place] [address]    Report Status 05/01/2020 FINAL  Final  Respiratory Panel by RT PCR (Flu A&B, Covid) - Nasopharyngeal Swab     Status: None   Collection Time: 04/29/20 11:38 PM   Specimen: Nasopharyngeal Swab  Result Value Ref Range Status   SARS Coronavirus 2 by RT PCR NEGATIVE NEGATIVE Final    Comment: (NOTE) SARS-CoV-2 target nucleic acids are NOT DETECTED.  The SARS-CoV-2 RNA is generally detectable in upper respiratoy specimens during the acute phase of infection. The lowest concentration of SARS-CoV-2 viral copies this assay can detect is 131 copies/mL. A negative result does not preclude SARS-Cov-2 infection and should not be used as the  sole basis for treatment or other patient management decisions. A negative result may occur with  improper specimen collection/handling, submission of specimen other than nasopharyngeal swab, presence of  viral mutation(s) within the areas targeted by this assay, and inadequate number of viral copies (<131 copies/mL). A negative result must be combined with clinical observations, patient history, and epidemiological information. The expected result is Negative.  Fact Sheet for Patients:  PinkCheek.be  Fact Sheet for Healthcare Providers:  GravelBags.it  This test is no t yet approved or cleared by the [place] FDA and  has been authorized for detection and/or diagnosis of SARS-CoV-2 by FDA under an Emergency Use Authorization (EUA). This EUA will remain  in effect (meaning this test can be used) for the duration of the COVID-19 declaration under Section 564(b)(1) of the Act, 21 U.S.C. section 360bbb-3(b)(1), unless the authorization is terminated or revoked sooner.     Influenza A by PCR NEGATIVE NEGATIVE Final   Influenza B by PCR NEGATIVE NEGATIVE Final    Comment: (NOTE) The Xpert Xpress SARS-CoV-2/FLU/RSV assay is intended as an aid in  the diagnosis of influenza from Nasopharyngeal swab specimens and  should not be used as a sole basis for treatment. Nasal washings and  aspirates are unacceptable for Xpert Xpress SARS-CoV-2/FLU/RSV  testing.  Fact Sheet for Patients: PinkCheek.be  Fact Sheet for Healthcare Providers: GravelBags.it  This test is not yet approved or cleared by the [place] FDA and  has been authorized for detection and/or diagnosis of SARS-CoV-2 by  FDA under an Emergency Use Authorization (EUA). This EUA will remain  in effect (meaning this test can be used) for the duration of the  Covid-19 declaration under Section 564(b)(1) of the Act, 21  U.S.C. section 360bbb-3(b)(1), unless the authorization is  terminated or revoked. Performed at [facility] [address], [place], [place] [address]   Urine culture     Status: None    Collection Time: 05/06/20  5:27 AM   Specimen: Urine, Catheterized  Result Value Ref Range Status   Specimen Description   Final    URINE, CATHETERIZED Performed at [facility], [address] [place], [place] [address]    Special Requests   Final    NONE Performed at [facility], [address] [place], [place] [address]    Culture   Final    NO GROWTH Performed at [facility], [place] [address], [place],  [address]    Report Status 05/07/2020 FINAL  Final  Respiratory Panel by RT PCR (Flu A&B, Covid) - Nasopharyngeal Swab     Status: None   Collection Time: 05/06/20  6:05 AM   Specimen: Nasopharyngeal Swab  Result Value Ref Range Status   SARS Coronavirus 2 by RT PCR NEGATIVE NEGATIVE Final    Comment: (NOTE) SARS-CoV-2 target nucleic acids are NOT DETECTED.  The SARS-CoV-2 RNA is generally detectable in upper respiratoy specimens during the acute phase of infection. The lowest concentration of SARS-CoV-2 viral copies this assay can detect is 131 copies/mL. A negative result does not preclude SARS-Cov-2 infection and should not be used as the sole basis for treatment or other patient management decisions. A negative result may occur with  improper specimen collection/handling, submission of specimen other than nasopharyngeal swab, presence of viral mutation(s) within the areas targeted by this assay, and inadequate number of viral copies (<131 copies/mL). A negative result must be combined with clinical observations, patient history, and epidemiological information. The expected result is Negative.  Fact Sheet for Patients:  PinkCheek.be  Fact Sheet for Healthcare Providers:  GravelBags.it  This test is no t yet approved or cleared by the [place] FDA and  has been authorized for detection and/or diagnosis of SARS-CoV-2 by FDA under an Emergency Use Authorization (EUA). This EUA will remain  in  effect (meaning this test can be used) for the duration of the COVID-19 declaration under Section 564(b)(1) of the Act, 21 U.S.C. section 360bbb-3(b)(1), unless the authorization is terminated or revoked sooner.     Influenza A by PCR NEGATIVE NEGATIVE Final   Influenza B by PCR NEGATIVE NEGATIVE Final    Comment: (NOTE) The Xpert Xpress SARS-CoV-2/FLU/RSV assay is intended as an aid in  the diagnosis of influenza from Nasopharyngeal swab specimens and  should not be used as a sole basis for treatment. Nasal washings and  aspirates are unacceptable for Xpert Xpress SARS-CoV-2/FLU/RSV  testing.  Fact Sheet for Patients: PinkCheek.be  Fact Sheet for Healthcare Providers: GravelBags.it  This test is not yet approved or cleared by the [place] FDA and  has been authorized for detection and/or diagnosis of SARS-CoV-2 by  FDA under an Emergency Use Authorization (EUA). This EUA will remain  in effect (meaning this test can be used) for the duration of the  Covid-19 declaration under Section 564(b)(1) of the Act, 21  U.S.C. section 360bbb-3(b)(1), unless the authorization is  terminated or revoked. Performed at [facility], [address] [place], [place] [address]      Time coordinating discharge: 35 minutes  SIGNED:   Rodena Goldmann, DO [facility] 05/07/2020, 11:06 AM  If 7PM-7AM, please contact night-coverage [email or web address]

## 2020-05-07 NOTE — Care Management Obs Status (Signed)
[place] NOTIFICATION   Patient Details  Name: [name] MRN: [id number] Date of Birth: Feb 24, 1991   Medicare Observation Status Notification Given:  Yes    Tommy Medal 05/07/2020, 11:21 AM

## 2020-05-07 NOTE — Progress Notes (Signed)
Patient ID: [name], female   DOB: [birth year], 29 y.o.   MRN: [id number]  [facility] Progress Note    Subjective:    Feels better today   Objective:   BP 124/82 (BP Location: Right Arm)   Pulse 84   Temp 98.3 F (36.8 C) (Oral)   Resp 16   Wt 29.5 kg   SpO2 100%   BMI 79.34 kg/m   Intake/Output: I/O last 3 completed shifts: In: 348 [IV Piggyback:348] Out: 150 [Urine:150]   Intake/Output this shift:  No intake/output data recorded. Weight change:   Physical Exam: Gen: NAD CVS: RRR Resp: cta Abd: +BS, soft, NtND Ext: LUE AVF +T/B  Labs: BMET Recent Labs  Lab 05/06/20 0046 05/07/20 0607  NA 142 137  K 3.7 3.9  CL 97* 92*  CO2 26 26  GLUCOSE 90 73  BUN 55* 60*  CREATININE 9.05* 10.41*  ALBUMIN 3.6  --   CALCIUM 9.7 8.7*   CBC Recent Labs  Lab 05/06/20 0046 05/07/20 0607  WBC 5.6 4.1  HGB 9.7* 9.1*  HCT 31.5* 30.1*  MCV 92.1 91.8  PLT 225 221      Medications:    . amLODipine  5 mg Oral QHS  . B-complex with vitamin C  1 tablet Oral Daily  . Chlorhexidine Gluconate Cloth  6 each Topical Q0600  . ferric citrate  420 mg Oral TID WC  . heparin  5,000 Units Subcutaneous Q8H  . hydrALAZINE  50 mg Oral TID  . metoCLOPramide  5 mg Oral QID  . metoprolol succinate  50 mg Oral Daily  . montelukast  10 mg Oral QHS  . [START ON 05/08/2020] multivitamin  1 tablet Oral Once per day on Tue Thu Sat  . pantoprazole (PROTONIX) IV  40 mg Intravenous Q24H  . sodium chloride flush  3 mL Intravenous Q12H   Dialysis prescription:  [facility] TTS, 2K, 2.25Ca, 3 hours, edw 27.3kg, BFR 350, DFR A1.5, UF profile 4, LUE AVF 16 gauge needles, micera 100 mcg every 2 w2eeks during HD, hectoral 61mcg IVP 2 x week, was on fortaz, no heparin.   Assessment/ Plan:   1. UTI- improved.  Cultures pending currently on zosyn 2. ESRD normally TTS but missed Saturday and refused HD yesterday.  Plan for HD today.  Will get back on schedule later this week.  Likely  Thursday if still here.  3. 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 Nutrition: renal diet 6. Hypertension: stable  Donetta Potts, MD [place] Pager [phone] 05/07/2020, 8:28 AM

## 2020-05-08 ENCOUNTER — Encounter (HOSPITAL_COMMUNITY)
Admission: EM | Disposition: A | Discharge status: Home / Self Care | Payer: Self-pay | Source: Home / Self Care | Attending: Internal Medicine

## 2020-05-08 ENCOUNTER — Inpatient Hospital Stay (HOSPITAL_COMMUNITY): Payer: Medicare Other | Admitting: Anesthesiology

## 2020-05-08 ENCOUNTER — Inpatient Hospital Stay (HOSPITAL_COMMUNITY): Payer: Medicare Other

## 2020-05-08 DIAGNOSIS — N186 End stage renal disease: Secondary | ICD-10-CM | POA: Diagnosis present

## 2020-05-08 DIAGNOSIS — A419 Sepsis, unspecified organism: Secondary | ICD-10-CM | POA: Diagnosis not present

## 2020-05-08 DIAGNOSIS — Z538 Procedure and treatment not carried out for other reasons: Secondary | ICD-10-CM | POA: Diagnosis not present

## 2020-05-08 DIAGNOSIS — Z8249 Family history of ischemic heart disease and other diseases of the circulatory system: Secondary | ICD-10-CM | POA: Diagnosis not present

## 2020-05-08 DIAGNOSIS — Z8051 Family history of malignant neoplasm of kidney: Secondary | ICD-10-CM | POA: Diagnosis not present

## 2020-05-08 DIAGNOSIS — F32A Depression, unspecified: Secondary | ICD-10-CM | POA: Diagnosis present

## 2020-05-08 DIAGNOSIS — T82590A Other mechanical complication of surgically created arteriovenous fistula, initial encounter: Secondary | ICD-10-CM | POA: Diagnosis present

## 2020-05-08 DIAGNOSIS — K5900 Constipation, unspecified: Secondary | ICD-10-CM | POA: Diagnosis present

## 2020-05-08 DIAGNOSIS — I120 Hypertensive chronic kidney disease with stage 5 chronic kidney disease or end stage renal disease: Secondary | ICD-10-CM | POA: Diagnosis present

## 2020-05-08 DIAGNOSIS — Z4901 Encounter for fitting and adjustment of extracorporeal dialysis catheter: Secondary | ICD-10-CM | POA: Diagnosis not present

## 2020-05-08 DIAGNOSIS — N390 Urinary tract infection, site not specified: Secondary | ICD-10-CM | POA: Diagnosis present

## 2020-05-08 DIAGNOSIS — J811 Chronic pulmonary edema: Secondary | ICD-10-CM | POA: Diagnosis not present

## 2020-05-08 DIAGNOSIS — K219 Gastro-esophageal reflux disease without esophagitis: Secondary | ICD-10-CM | POA: Diagnosis present

## 2020-05-08 DIAGNOSIS — R6520 Severe sepsis without septic shock: Secondary | ICD-10-CM

## 2020-05-08 DIAGNOSIS — Z803 Family history of malignant neoplasm of breast: Secondary | ICD-10-CM | POA: Diagnosis not present

## 2020-05-08 DIAGNOSIS — Q059 Spina bifida, unspecified: Secondary | ICD-10-CM | POA: Diagnosis not present

## 2020-05-08 DIAGNOSIS — Z992 Dependence on renal dialysis: Secondary | ICD-10-CM | POA: Diagnosis not present

## 2020-05-08 DIAGNOSIS — Z91010 Allergy to peanuts: Secondary | ICD-10-CM | POA: Diagnosis not present

## 2020-05-08 DIAGNOSIS — Z888 Allergy status to other drugs, medicaments and biological substances status: Secondary | ICD-10-CM | POA: Diagnosis not present

## 2020-05-08 DIAGNOSIS — Z87891 Personal history of nicotine dependence: Secondary | ICD-10-CM | POA: Diagnosis not present

## 2020-05-08 DIAGNOSIS — E8889 Other specified metabolic disorders: Secondary | ICD-10-CM | POA: Diagnosis present

## 2020-05-08 DIAGNOSIS — D631 Anemia in chronic kidney disease: Secondary | ICD-10-CM | POA: Diagnosis present

## 2020-05-08 DIAGNOSIS — Y832 Surgical operation with anastomosis, bypass or graft as the cause of abnormal reaction of the patient, or of later complication, without mention of misadventure at the time of the procedure: Secondary | ICD-10-CM | POA: Diagnosis present

## 2020-05-08 DIAGNOSIS — Z79899 Other long term (current) drug therapy: Secondary | ICD-10-CM | POA: Diagnosis not present

## 2020-05-08 DIAGNOSIS — Z881 Allergy status to other antibiotic agents status: Secondary | ICD-10-CM | POA: Diagnosis not present

## 2020-05-08 DIAGNOSIS — Z20822 Contact with and (suspected) exposure to covid-19: Secondary | ICD-10-CM | POA: Diagnosis present

## 2020-05-08 DIAGNOSIS — Z8261 Family history of arthritis: Secondary | ICD-10-CM | POA: Diagnosis not present

## 2020-05-08 DIAGNOSIS — I517 Cardiomegaly: Secondary | ICD-10-CM | POA: Diagnosis not present

## 2020-05-08 DIAGNOSIS — I872 Venous insufficiency (chronic) (peripheral): Secondary | ICD-10-CM | POA: Diagnosis not present

## 2020-05-08 DIAGNOSIS — Z91040 Latex allergy status: Secondary | ICD-10-CM | POA: Diagnosis not present

## 2020-05-08 DIAGNOSIS — Z9115 Patient's noncompliance with renal dialysis: Secondary | ICD-10-CM | POA: Diagnosis not present

## 2020-05-08 HISTORY — PX: INSERTION OF DIALYSIS CATHETER: SHX1324

## 2020-05-08 LAB — MRSA PCR SCREENING

## 2020-05-08 SURGERY — INSERTION OF DIALYSIS CATHETER
Anesthesia: General

## 2020-05-08 MED ORDER — ORAL CARE MOUTH RINSE: 15.0000 mL | Freq: Once | OROMUCOSAL | Status: DC

## 2020-05-08 MED ORDER — MIDAZOLAM HCL 2 MG/2ML IJ SOLN
1.0000 mg | Freq: Once | INTRAMUSCULAR | Status: AC
  Administered 2020-05-08: 1 mg via INTRAVENOUS

## 2020-05-08 MED ORDER — CEFAZOLIN SODIUM-DEXTROSE 2-4 GM/100ML-% IV SOLN
INTRAVENOUS | Status: AC
  Filled 2020-05-08: qty 100

## 2020-05-08 MED ORDER — SODIUM CHLORIDE (PF) 0.9 % IJ SOLN
INTRAMUSCULAR | Status: DC | PRN
  Administered 2020-05-08: 500 mL

## 2020-05-08 MED ORDER — CEFAZOLIN SODIUM-DEXTROSE 1-4 GM/50ML-% IV SOLN
INTRAVENOUS | Status: AC
  Filled 2020-05-08: qty 50

## 2020-05-08 MED ORDER — CHLORHEXIDINE GLUCONATE CLOTH 2 % EX PADS: 6.0000 | MEDICATED_PAD | Freq: Once | CUTANEOUS | Status: DC

## 2020-05-08 MED ORDER — CEFAZOLIN SODIUM-DEXTROSE 2-4 GM/100ML-% IV SOLN: 2.0000 g | INTRAVENOUS | Status: DC

## 2020-05-08 MED ORDER — MIDAZOLAM HCL 2 MG/2ML IJ SOLN
INTRAMUSCULAR | Status: AC
  Filled 2020-05-08: qty 2

## 2020-05-08 MED ORDER — FENTANYL CITRATE (PF) 100 MCG/2ML IJ SOLN
INTRAMUSCULAR | Status: AC
  Filled 2020-05-08: qty 2

## 2020-05-08 MED ORDER — FENTANYL CITRATE (PF) 100 MCG/2ML IJ SOLN
INTRAMUSCULAR | Status: DC | PRN
  Administered 2020-05-08 (×2): 50 ug via INTRAVENOUS

## 2020-05-08 MED ORDER — HYDROMORPHONE HCL 1 MG/ML IJ SOLN
INTRAMUSCULAR | Status: AC
  Filled 2020-05-08: qty 0.5

## 2020-05-08 MED ORDER — AMOXICILLIN-POT CLAVULANATE 500-125 MG PO TABS
1.0000 | ORAL_TABLET | Freq: Every day | ORAL | Status: DC
  Filled 2020-05-08: qty 1

## 2020-05-08 MED ORDER — SODIUM CHLORIDE 0.9 % IV SOLN: INTRAVENOUS | Status: DC

## 2020-05-08 MED ORDER — CEFAZOLIN SODIUM-DEXTROSE 1-4 GM/50ML-% IV SOLN
INTRAVENOUS | Status: DC | PRN
  Administered 2020-05-08: 1 g via INTRAVENOUS

## 2020-05-08 MED ORDER — LIDOCAINE HCL (PF) 1 % IJ SOLN
INTRAMUSCULAR | Status: AC
  Filled 2020-05-08: qty 30

## 2020-05-08 MED ORDER — DIPHENHYDRAMINE HCL 50 MG/ML IJ SOLN
INTRAMUSCULAR | Status: DC | PRN
  Administered 2020-05-08: 12.5 mg via INTRAVENOUS

## 2020-05-08 MED ORDER — CHLORHEXIDINE GLUCONATE 0.12 % MT SOLN: 15.0000 mL | Freq: Once | OROMUCOSAL | Status: DC

## 2020-05-08 MED ORDER — HYDROMORPHONE HCL 1 MG/ML IJ SOLN
0.2500 mg | INTRAMUSCULAR | Status: DC | PRN
  Administered 2020-05-08 (×2): 0.25 mg via INTRAVENOUS

## 2020-05-08 MED ORDER — DIPHENHYDRAMINE HCL 50 MG/ML IJ SOLN
12.5000 mg | Freq: Once | INTRAMUSCULAR | Status: AC
  Administered 2020-05-08: 12.5 mg via INTRAVENOUS

## 2020-05-08 MED ORDER — PROPOFOL 10 MG/ML IV BOLUS
INTRAVENOUS | Status: AC
  Filled 2020-05-08: qty 40

## 2020-05-08 MED ORDER — HEPARIN SODIUM (PORCINE) 1000 UNIT/ML IJ SOLN
INTRAMUSCULAR | Status: AC
  Filled 2020-05-08: qty 4

## 2020-05-08 MED ORDER — LIDOCAINE HCL (PF) 1 % IJ SOLN
INTRAMUSCULAR | Status: DC | PRN
  Administered 2020-05-08: 3 mL

## 2020-05-08 MED ORDER — DIPHENHYDRAMINE HCL 50 MG/ML IJ SOLN
INTRAMUSCULAR | Status: AC
  Filled 2020-05-08: qty 1

## 2020-05-08 MED ORDER — SODIUM CHLORIDE 0.9 % IV SOLN: INTRAVENOUS | Status: DC | PRN

## 2020-05-08 SURGICAL SUPPLY — 50 items
APL PRP STRL LF ISPRP CHG 10.5 (MISCELLANEOUS) ×1
APPLICATOR CHLORAPREP 10.5 ORG (MISCELLANEOUS) ×3 IMPLANT
BAG DECANTER FOR FLEXI CONT (MISCELLANEOUS) ×3 IMPLANT
BANDAID FLEXIBLE 1X3 (GAUZE/BANDAGES/DRESSINGS) ×3 IMPLANT
BIOPATCH RED 1 DISK 7.0 (GAUZE/BANDAGES/DRESSINGS) ×2 IMPLANT
BIOPATCH RED 1IN DISK 7.0MM (GAUZE/BANDAGES/DRESSINGS) ×1
CATH PALINDROME-P 19CM W/VT (CATHETERS) ×2 IMPLANT
COVER LIGHT HANDLE STERIS (MISCELLANEOUS) ×6 IMPLANT
COVER PROBE U/S 5X48 (MISCELLANEOUS) ×3 IMPLANT
COVER WAND RF STERILE (DRAPES) ×3 IMPLANT
DECANTER SPIKE VIAL GLASS SM (MISCELLANEOUS) ×6 IMPLANT
DRAPE C-ARM FOLDED MOBILE STRL (DRAPES) ×3 IMPLANT
DRAPE CHEST BREAST 15X10 FENES (DRAPES) ×3 IMPLANT
DRSG SORBAVIEW 3.5X5-5/16 MED (GAUZE/BANDAGES/DRESSINGS) ×3 IMPLANT
ELECT REM PT RETURN 9FT ADLT (ELECTROSURGICAL) ×3
ELECTRODE REM PT RTRN 9FT ADLT (ELECTROSURGICAL) ×1 IMPLANT
GAUZE 4X4 16PLY RFD (DISPOSABLE) ×3 IMPLANT
GEL ULTRASOUND 20GR AQUASONIC (MISCELLANEOUS) ×3 IMPLANT
GLOVE BIOGEL PI IND STRL 6.5 (GLOVE) ×1 IMPLANT
GLOVE BIOGEL PI IND STRL 7.0 (GLOVE) ×2 IMPLANT
GLOVE BIOGEL PI IND STRL 7.5 (GLOVE) ×1 IMPLANT
GLOVE BIOGEL PI INDICATOR 6.5 (GLOVE) ×2
GLOVE BIOGEL PI INDICATOR 7.0 (GLOVE) ×4
GLOVE BIOGEL PI INDICATOR 7.5 (GLOVE) ×2
GLOVE SURG SS PI 6.5 STRL IVOR (GLOVE) ×2 IMPLANT
GLOVE SURG SS PI 7.0 STRL IVOR (GLOVE) ×2 IMPLANT
GOWN STRL REUS W/ TWL XL LVL3 (GOWN DISPOSABLE) IMPLANT
GOWN STRL REUS W/TWL LRG LVL3 (GOWN DISPOSABLE) ×6 IMPLANT
GOWN STRL REUS W/TWL XL LVL3 (GOWN DISPOSABLE) ×3
IV NS 500ML (IV SOLUTION) ×3
IV NS 500ML BAXH (IV SOLUTION) ×1 IMPLANT
KIT BLADEGUARD II DBL (SET/KITS/TRAYS/PACK) ×3 IMPLANT
KIT TURNOVER KIT A (KITS) ×3 IMPLANT
MARKER SKIN DUAL TIP RULER LAB (MISCELLANEOUS) ×3 IMPLANT
NDL HYPO 18GX1.5 BLUNT FILL (NEEDLE) ×1 IMPLANT
NDL HYPO 25X1 1.5 SAFETY (NEEDLE) ×1 IMPLANT
NEEDLE HYPO 18GX1.5 BLUNT FILL (NEEDLE) ×3 IMPLANT
NEEDLE HYPO 25X1 1.5 SAFETY (NEEDLE) ×3 IMPLANT
PACK BASIC III (CUSTOM PROCEDURE TRAY) ×3
PACK SRG BSC III STRL LF ECLPS (CUSTOM PROCEDURE TRAY) ×1 IMPLANT
PAD ARMBOARD 7.5X6 YLW CONV (MISCELLANEOUS) ×3 IMPLANT
PENCIL SMOKE EVACUATOR COATED (MISCELLANEOUS) ×3 IMPLANT
SET BASIN LINEN APH (SET/KITS/TRAYS/PACK) ×3 IMPLANT
SUT MNCRL AB 4-0 PS2 18 (SUTURE) ×3 IMPLANT
SUT VIC AB 3-0 SH 27 (SUTURE) ×3
SUT VIC AB 3-0 SH 27X BRD (SUTURE) ×1 IMPLANT
SYR 10ML LL (SYRINGE) ×2 IMPLANT
SYR 20ML LL LF (SYRINGE) ×3 IMPLANT
SYR 5ML LL (SYRINGE) ×3 IMPLANT
SYR CONTROL 10ML LL (SYRINGE) ×3 IMPLANT

## 2020-05-08 NOTE — Consult Note (Signed)
[facility] Consult  Reason for Consult: Tunneled dialysis catheter  Referring Physician:  Dr. Marval Regal   Chief Complaint    Emesis      HPI: [name] is a 29 y.o. 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 She had a recent fistulogram that showed no abnormalities per the patient but has been getting clots from the AVF.  Dialysis was attempted yesterday and was unsuccessful and request for tunneled access was made.  The patient has expressed some desire to speak with palliative care regarding her overall goals of care as she reports being "tired of all of this." She has a history of spina bifida with caudal regression syndrome, HTN, and UTIs.    She had one prior tunneled catheter at the time of dialysis initiation.  She is nervous about the procedure.   Past Medical History:  Diagnosis Date  . Anemia associated with chronic renal failure   . Blood transfusion   . 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  . Depression 05/14/2015  . Dialysis care   . ESRD (end stage renal disease) on dialysis ([place])   . GERD (gastroesophageal reflux disease) 01/07/2017  . HTN (hypertension) 05/02/2011  . Spina bifida   . UTI (lower urinary tract infection)     Past Surgical History:  Procedure Laterality Date  . AV FISTULA PLACEMENT     left arm    Family History  Problem Relation Age of Onset  . Kidney cancer Other   . Hypertension Maternal Grandmother   . Arthritis Maternal Grandmother   . Breast cancer Maternal Aunt   . Colon cancer Neg Hx     Social History   Tobacco Use  . Smoking status: Former Smoker    Packs/day: 0.10    Types: Cigarettes  . Smokeless tobacco: Never Used  . Tobacco comment: 2 cigs a day  Vaping Use  . Vaping Use: Never used  Substance Use Topics  . Alcohol use: No  . Drug use: No    Medications: I have reviewed the patient's current medications. Current  Facility-Administered Medications  Medication Dose Route Frequency Provider Last Rate Last Admin  . [MAR Hold] 0.9 %  sodium chloride infusion  250 mL Intravenous PRN Manuella Ghazi, Pratik D, DO      . [MAR Hold] acetaminophen (TYLENOL) tablet 650 mg  650 mg Oral Q6H PRN Heath Lark D, DO   650 mg at 05/06/20 1421   Or  . Doug Sou Hold] acetaminophen (TYLENOL) suppository 650 mg  650 mg Rectal Q6H PRN Manuella Ghazi, Pratik D, DO      . Doug Sou Hold] amLODipine (NORVASC) tablet 5 mg  5 mg Oral QHS Shah, Pratik D, DO   5 mg at 05/07/20 2100  . [MAR Hold] amoxicillin-clavulanate (AUGMENTIN) 500-125 MG per tablet 500 mg  1 tablet Oral Daily Manuella Ghazi, Pratik D, DO      . [MAR Hold] B-complex with vitamin C tablet 1 tablet  1 tablet Oral Daily Manuella Ghazi, Pratik D, DO   1 tablet at 05/07/20 1004  . [MAR Hold] Chlorhexidine Gluconate Cloth 2 % PADS 6 each  6 each Topical Q0600 Justin Mend, MD   6 each at 05/08/20 0522  . [MAR Hold] diphenhydrAMINE (BENADRYL) capsule 25 mg  25 mg Oral Daily PRN Manuella Ghazi, Pratik D, DO      . [MAR Hold] ferric citrate (AURYXIA) tablet 420 mg  420  mg Oral TID WC Shah, Pratik D, DO   420 mg at 05/07/20 1441  . [MAR Hold] heparin injection 5,000 Units  5,000 Units Subcutaneous Q8H Shah, Pratik D, DO   5,000 Units at 05/06/20 1823  . [MAR Hold] hydrALAZINE (APRESOLINE) tablet 50 mg  50 mg Oral TID Manuella Ghazi, Pratik D, DO   50 mg at 05/07/20 2100  . [MAR Hold] metoCLOPramide (REGLAN) tablet 5 mg  5 mg Oral QID Manuella Ghazi, Pratik D, DO   5 mg at 05/07/20 2100  . [MAR Hold] metoprolol succinate (TOPROL-XL) 24 hr tablet 50 mg  50 mg Oral Daily Manuella Ghazi, Pratik D, DO   50 mg at 05/07/20 1356  . [MAR Hold] montelukast (SINGULAIR) tablet 10 mg  10 mg Oral QHS Shah, Pratik D, DO   10 mg at 05/07/20 2100  . [MAR Hold] multivitamin (RENA-VIT) tablet 1 tablet  1 tablet Oral Once per day on Tue Thu Sat Heath Lark D, DO      . Doug Sou Hold] ondansetron [facility]) tablet 4 mg  4 mg Oral Q6H PRN Manuella Ghazi, Pratik D, DO       Or  . Doug Sou Hold]  ondansetron [facility]) injection 4 mg  4 mg Intravenous Q6H PRN Manuella Ghazi, Pratik D, DO   4 mg at 05/06/20 2004  . [MAR Hold] oxyCODONE (Oxy IR/ROXICODONE) immediate release tablet 5 mg  5 mg Oral Q6H PRN Manuella Ghazi, Pratik D, DO   5 mg at 05/06/20 2004  . [MAR Hold] pantoprazole (PROTONIX) injection 40 mg  40 mg Intravenous Q24H Shah, Pratik D, DO   40 mg at 05/07/20 1003  . [MAR Hold] promethazine (PHENERGAN) injection 25 mg  25 mg Intravenous Q6H PRN Manuella Ghazi, Pratik D, DO      . [MAR Hold] sodium chloride flush (NS) 0.9 % injection 3 mL  3 mL Intravenous Q12H Shah, Pratik D, DO   3 mL at 05/07/20 2046  . [MAR Hold] sodium chloride flush (NS) 0.9 % injection 3 mL  3 mL Intravenous PRN Manuella Ghazi, Pratik D, DO       Allergies  Allergen Reactions  . Ciprofloxacin Shortness Of Breath, Nausea And Vomiting and Other (See Comments)    HIGH FEVER and oral blisters   . Other Anaphylaxis    Revaclear dialzer  . Peanut-Containing Drug Products Anaphylaxis  . Aleve [Naproxen Sodium] Other (See Comments)    G.I.Bleed  . Ceftriaxone Other (See Comments)    Blisters in mouth   . Coconut Oil Hives  . Influenza Vaccines Nausea And Vomiting and Other (See Comments)    High fever  . Tetanus Toxoid, Adsorbed Nausea And Vomiting and Other (See Comments)    HIGH FEVER, also  . Tetanus Toxoids Nausea And Vomiting and Other (See Comments)    HIGH FEVER  . Latex Itching and Rash     ROS:  A comprehensive review of systems was negative except for: Behavioral/Psych: positive for anxious about procedure  Blood pressure 133/80, pulse 76, temperature 98.5 F (36.9 C), temperature source Oral, resp. rate 20, weight 29.5 kg, SpO2 100 %. Physical Exam Vitals reviewed.  HENT:     Head: Normocephalic.     Nose: Nose normal.  Eyes:     Extraocular Movements: Extraocular movements intact.  Neck:     Comments: Right internal jugular open and patent on [place] Cardiovascular:     Rate and Rhythm: Normal rate.  Pulmonary:      Effort: Pulmonary effort is normal.  Abdominal:  General: There is no distension.     Palpations: Abdomen is soft.  Musculoskeletal:     Cervical back: Normal range of motion.     Comments: LUE AVF with bandage, thrill felt  Skin:    General: Skin is warm.  Neurological:     Mental Status: She is alert and oriented to person, place, and time. Mental status is at baseline.  Psychiatric:        Mood and Affect: Mood normal.        Thought Content: Thought content normal.     Results: Results for orders placed or performed during the hospital encounter of 05/06/20 (from the past 48 hour(s))  Magnesium     Status: Abnormal   Collection Time: 05/07/20  6:07 AM  Result Value Ref Range   Magnesium 2.7 (H) 1.7 - 2.4 mg/dL    Comment: Performed at [facility], [address], [place], [place] [address]  Basic metabolic panel     Status: Abnormal   Collection Time: 05/07/20  6:07 AM  Result Value Ref Range   Sodium 137 135 - 145 mmol/L   Potassium 3.9 3.5 - 5.1 mmol/L   Chloride 92 (L) 98 - 111 mmol/L   CO2 26 22 - 32 mmol/L   Glucose, Bld 73 70 - 99 mg/dL    Comment: Glucose reference range applies only to samples taken after fasting for at least 8 hours.   BUN 60 (H) 6 - 20 mg/dL   Creatinine, Ser 10.41 (H) 0.44 - 1.00 mg/dL   Calcium 8.7 (L) 8.9 - 10.3 mg/dL   GFR, Estimated 5 (L) >60 mL/min   Anion gap 19 (H) 5 - 15    Comment: Performed at [facility], [address] [place], [place] [address]  CBC     Status: Abnormal   Collection Time: 05/07/20  6:07 AM  Result Value Ref Range   WBC 4.1 4.0 - 10.5 K/uL   RBC 3.28 (L) 3.87 - 5.11 MIL/uL   Hemoglobin 9.1 (L) 12.0 - 15.0 g/dL   HCT 30.1 (L) 36 - 46 %   MCV 91.8 80.0 - 100.0 fL   MCH 27.7 26.0 - 34.0 pg   MCHC 30.2 30.0 - 36.0 g/dL   RDW 17.3 (H) 11.5 - 15.5 %   Platelets 221 150 - 400 K/uL   nRBC 0.0 0.0 - 0.2 %    Comment: Performed at [facility], [address] [place], [place] [address]     Assessment &  Plan:  ANTONIETA SLAVEN is a 29 y.o. female with ESRD who has a malfunctioning AVF. She needs a tunneled line for access as outpatient.   -We discussed the risk and benefits of tunneled dialysis access including but not limited to bleeding, infection, pneumothorax, injury to the vascular structures/ artery, likely placement in the right internal jugular vein but possible placement in the left internal jugular vein.  Discussed that we perform this under ultrasound guidance and fluoroscopic guidance, and that a CXR is obtained at the end to verify the placement. We discussed that she will be followed by the nephrology center for this dialysis access, and they will manage the access and remove it when appropriate.     All questions were answered to the satisfaction of the patient.   Virl Cagey 05/08/2020, 10:49 AM

## 2020-05-08 NOTE — Progress Notes (Signed)
IR consulted by Dr. Constance Haw for possible image-guided tunneled HD catheter placement.  Plan for procedure in [facility] IR tomorrow- patient to be transferred to/from [facility] for procedure, arrive at 0900 for 1000 procedure. Gilmore Laroche, RN aware to set up carelink for transport. Patient will be NPO at midnight. Patient will be seen/consented for procedure tomorrow in [facility] IR.  Please call IR with questions/concerns.   Bea Graff Jenille Laszlo, PA-C 05/08/2020, 2:37 PM

## 2020-05-08 NOTE — Progress Notes (Signed)
Patient ID: [name], female   DOB: 08/07/1990, 29 y.o.   MRN: [id number] S: Unable to have HD yesterday due to recurrent infiltrations.  For [facility] today.  Pt somewhat anxious but want to "get it over with". O:BP 133/80 (BP Location: Right Arm)   Pulse 76   Temp 98.5 F (36.9 C) (Oral)   Resp 20   Wt 29.5 kg   SpO2 100%   BMI 79.34 kg/m   Intake/Output Summary (Last 24 hours) at 05/08/2020 1105 Last data filed at 05/08/2020 0500 Gross per 24 hour  Intake --  Output 500 ml  Net -500 ml   Intake/Output: I/O last 3 completed shifts: In: 171 [P.O.:120; I.V.:3; IV Piggyback:48] Out: 650 [Urine:650]  Intake/Output this shift:  No intake/output data recorded. Weight change:  Gen: NAD CVS: no rub Resp: cta Abd: benign Ext: no edema, LUE AVF +T/B with multiple ecchymoses  Recent Labs  Lab 05/06/20 0046 05/07/20 0607  NA 142 137  K 3.7 3.9  CL 97* 92*  CO2 26 26  GLUCOSE 90 73  BUN 55* 60*  CREATININE 9.05* 10.41*  ALBUMIN 3.6  --   CALCIUM 9.7 8.7*  AST 22  --   ALT 18  --    Liver Function Tests: Recent Labs  Lab 05/06/20 0046  AST 22  ALT 18  ALKPHOS 46  BILITOT 0.9  PROT 6.9  ALBUMIN 3.6   Recent Labs  Lab 05/06/20 0046  LIPASE 44   No results for input(s): AMMONIA in the last 168 hours. CBC: Recent Labs  Lab 05/06/20 0046 05/07/20 0607  WBC 5.6 4.1  HGB 9.7* 9.1*  HCT 31.5* 30.1*  MCV 92.1 91.8  PLT 225 221   Cardiac Enzymes: No results for input(s): CKTOTAL, CKMB, CKMBINDEX, TROPONINI in the last 168 hours. CBG: No results for input(s): GLUCAP in the last 168 hours.  Iron Studies: No results for input(s): IRON, TIBC, TRANSFERRIN, FERRITIN in the last 72 hours. Studies/Results: No results found. Doug Sou Hold] amLODipine  5 mg Oral QHS  . [MAR Hold] amoxicillin-clavulanate  1 tablet Oral Daily  . [MAR Hold] B-complex with vitamin C  1 tablet Oral Daily  . [MAR Hold] Chlorhexidine Gluconate Cloth  6 each Topical Q0600  . [MAR Hold]  ferric citrate  420 mg Oral TID WC  . [MAR Hold] heparin  5,000 Units Subcutaneous Q8H  . [MAR Hold] hydrALAZINE  50 mg Oral TID  . [MAR Hold] metoCLOPramide  5 mg Oral QID  . [MAR Hold] metoprolol succinate  50 mg Oral Daily  . [MAR Hold] montelukast  10 mg Oral QHS  . [MAR Hold] multivitamin  1 tablet Oral Once per day on Tue Thu Sat  . [MAR Hold] pantoprazole (PROTONIX) IV  40 mg Intravenous Q24H  . [facility] Hold] sodium chloride flush  3 mL Intravenous Q12H    BMET    Component Value Date/Time   NA 137 05/07/2020 0607   K 3.9 05/07/2020 0607   CL 92 (L) 05/07/2020 0607   CO2 26 05/07/2020 0607   GLUCOSE 73 05/07/2020 0607   BUN 60 (H) 05/07/2020 0607   CREATININE 10.41 (H) 05/07/2020 0607   CALCIUM 8.7 (L) 05/07/2020 0607   GFRNONAA 5 (L) 05/07/2020 0607   GFRAA 9 (L) 04/30/2020 0455   CBC    Component Value Date/Time   WBC 4.1 05/07/2020 0607   RBC 3.28 (L) 05/07/2020 0607   HGB 9.1 (L) 05/07/2020 0607   HCT 30.1 (  L) 05/07/2020 0607   PLT 221 05/07/2020 0607   MCV 91.8 05/07/2020 0607   MCH 27.7 05/07/2020 0607   MCHC 30.2 05/07/2020 0607   RDW 17.3 (H) 05/07/2020 0607   LYMPHSABS 1.4 04/29/2020 1720   MONOABS 0.6 04/29/2020 1720   EOSABS 0.1 04/29/2020 1720   BASOSABS 0.0 04/29/2020 1720   Dialysis prescription:  [facility] TTS, 2K, 2.25Ca, 3 hours, edw 27.3kg, BFR 350, DFR A1.5, UF profile 4, LUE AVF 16 gauge needles, micera 100 mcg every 2 w2eeks during HD, hectoral 62mcg IVP 2 x week, was on fortaz, no heparin.  Assessment/Plan:  1. UTI- improved.  Cultures pending currently on zosyn 2. ESRD normally TTS but infiltrated on Saturday and again on Monday. 1[facility] and hopeful discharge afterwards. 2. Unclear why her AVF infiltrated twice when she had a normal fistulogram on Friday.  Will refer back to [facility] for further evaluation as an outpatient 3. Will keep [place] in place for at least a week to rest AVF 3. 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 Nutrition: renal diet 6. Hypertension: stable 7. Disposition- hopeful discharge today after HD.  Donetta Potts, MD [employer] [phone]

## 2020-05-08 NOTE — Transfer of Care (Signed)
Immediate Anesthesia Transfer of Care Note  Patient: [name]  Procedure(s) Performed: ATTEMPTED INSERTION OF DIALYSIS CATHETER TUNNELED (N/A )  Patient Location: PACU  Anesthesia Type:General  Level of Consciousness: awake, alert , oriented and drowsy  Airway & Oxygen Therapy: Patient Spontanous Breathing  Post-op Assessment: Report given to RN and Post -op Vital signs reviewed and stable  Post vital signs: Reviewed and stable  Last Vitals:  Vitals Value Taken Time  BP 141/78 05/08/20 1315  Temp    Pulse 87 05/08/20 1315  Resp 19 05/08/20 1315  SpO2 89 % 05/08/20 1315  Vitals shown include unvalidated device data.  Last Pain:  Vitals:   05/08/20 1120  TempSrc: Oral  PainSc: 0-No pain      Patients Stated Pain Goal: 5 (11/27/54 1548)  Complications: No complications documented.

## 2020-05-08 NOTE — Anesthesia Postprocedure Evaluation (Signed)
Anesthesia Post Note  Patient: [name]  Procedure(s) Performed: ATTEMPTED INSERTION OF DIALYSIS CATHETER TUNNELED (N/A )  Patient location during evaluation: PACU Anesthesia Type: General Level of consciousness: awake, oriented and awake and alert Pain management: satisfactory to patient Vital Signs Assessment: post-procedure vital signs reviewed and stable Respiratory status: spontaneous breathing, respiratory function stable and nonlabored ventilation Cardiovascular status: stable Postop Assessment: no apparent nausea or vomiting Anesthetic complications: no   No complications documented.   Last Vitals:  Vitals:   05/08/20 1120 05/08/20 1313  BP: 131/83 (!) 141/78  Pulse: 87 83  Resp: 15 20  Temp: 36.8 C (!) 36.4 C  SpO2: 100% 99%    Last Pain:  Vitals:   05/08/20 1120  TempSrc: Oral  PainSc: 0-No pain                 Aryav Wimberly

## 2020-05-08 NOTE — Progress Notes (Addendum)
[facility]  Attempted right internal jugular tunneled dialysis access but unsuccessful due to wire curling. Procedure aborted. CXR ordered to ensure no pneumothorax following manipulation. Dr. Manuella Ghazi and Nephrology made aware. Will need IR/ Vascular for access given difficulties.   Notified Godmother, Olga Millers, [phone], at patient's request of unsuccessful procedure and need for further procedures.   Curlene Labrum, MD [facility] [address] [place], [place] [address] [phone] (office)

## 2020-05-08 NOTE — Progress Notes (Signed)
[facility]  Spoke with pharmacy and patient has allergy to cephalosporin for blisters in the skin. Has received after this allergy was listed other cephalosporins. Will proceed with ancef for the procedure.   Curlene Labrum, MD [facility] [address] [place],  [address] [phone] (office)

## 2020-05-08 NOTE — Progress Notes (Addendum)
Patient seen and evaluated this morning.  She was noted to have infiltration of her dialysis site on 10/11 for which general surgery was contacted for hemodialysis catheter placement.  Unfortunately, she could not undergo successful placement of the catheter and therefore, IR has been consulted for further evaluation and management.  Plan to return back home once catheter placed with successful hemodialysis.  Please refer to discharge summary dictated 10/11 for full details.  Total care time: 25 minutes.

## 2020-05-08 NOTE — Op Note (Signed)
Operative Note 05/08/20   Preoperative Diagnosis: End Stage Renal Disease    Postoperative Diagnosis: Same   Procedure(s) Performed: Attempted Unsuccessful Tunneled Dialysis Catheter Placement, Right Internal Jugular    Surgeon: Lanell Matar. Constance Haw, MD   Assistants: No qualified resident was available   Anesthesia: Monitored anesthesia care   Anesthesiologist: Denese Killings, MD    Specimens: None   Estimated Blood Loss: Minimal   Fluoroscopy time: 9 seconds   Blood Replacement: None    Complications: None    Operative Findings: Unable to pass wire below clavicle, curled   Indications: Ms. Manninen is a 29 yo with ESRD and malfunctioning AVF she had a prior tunneled catheter in past at 29 yo but is unsure of the side. She needs tunneled access for continued dialysis until her fistula is functioning. We discussed the procedure and the risk of bleeding, infection, injury to vessels, pneumothorax, and unsuccessful placement. We opted to proceed. In the preoperative area an [place] was used to verify a patent right internal jugular in the neck. She had a prior scar in this area likely from her prior tunneled cathter.   Procedure: The patient was brought into the operating room and monitored anesthesia was induced.   The right chest and neck was prepped and draped in the usual sterile fashion.  Preoperative antibiotics were given.   An Ultrasound was used to verify that the right internal jugular vein was patent.  One percent lidocaine was used for local anesthesia.  The patient was measured and a 19 cm Palindrome dual lumen dialysis catheter was chosen.  The needle advanced into the right internal jugular vein using the Seldinger technique without difficulty.  A guidewire was then advanced but curled at the level of the clavicle. Attempted readjustment of the needle and positioning of the patient did not change the curling of the wire.  Given this the procedure was aborted as there was  potential scar, occlusion, anatomy issues.  She will need further access with IR or Vascular with capability of venography at the time of the procedure.  Pressure was held over the neck and a sterile bandage applied over the puncture sites from the needle.     All tape and needle counts were correct at the end of the procedure. The patient was transferred to PACU in stable condition. A chest x-ray will be performed at that time.  Curlene Labrum, MD [facility] [address] [place], [place] [address] [phone] (office)

## 2020-05-09 ENCOUNTER — Ambulatory Visit (HOSPITAL_COMMUNITY): Payer: Medicare Other

## 2020-05-09 ENCOUNTER — Encounter (HOSPITAL_COMMUNITY): Payer: Self-pay | Admitting: General Surgery

## 2020-05-09 DIAGNOSIS — T8249XA Other complication of vascular dialysis catheter, initial encounter: Secondary | ICD-10-CM | POA: Insufficient documentation

## 2020-05-09 DIAGNOSIS — N186 End stage renal disease: Secondary | ICD-10-CM

## 2020-05-09 DIAGNOSIS — Y841 Kidney dialysis as the cause of abnormal reaction of the patient, or of later complication, without mention of misadventure at the time of the procedure: Secondary | ICD-10-CM | POA: Insufficient documentation

## 2020-05-09 DIAGNOSIS — N390 Urinary tract infection, site not specified: Secondary | ICD-10-CM

## 2020-05-09 HISTORY — PX: IR FLUORO GUIDE CV LINE RIGHT: IMG2283

## 2020-05-09 HISTORY — PX: IR US GUIDE VASC ACCESS RIGHT: IMG2390

## 2020-05-09 LAB — RENAL FUNCTION PANEL
Albumin: 3.1 g/dL — ABNORMAL LOW (ref 3.5–5.0)
Anion gap: 17 — ABNORMAL HIGH (ref 5–15)
BUN: 79 mg/dL — ABNORMAL HIGH (ref 6–20)
CO2: 21 mmol/L — ABNORMAL LOW (ref 22–32)
Calcium: 8.7 mg/dL — ABNORMAL LOW (ref 8.9–10.3)
Chloride: 99 mmol/L (ref 98–111)
Creatinine, Ser: 12.26 mg/dL — ABNORMAL HIGH (ref 0.44–1.00)
GFR, Estimated: 4 mL/min — ABNORMAL LOW (ref >60)
Glucose, Bld: 57 mg/dL — ABNORMAL LOW (ref 70–99)
Phosphorus: 10.3 mg/dL — ABNORMAL HIGH (ref 2.5–4.6)
Potassium: 4.5 mmol/L (ref 3.5–5.1)
Sodium: 137 mmol/L (ref 135–145)

## 2020-05-09 LAB — CBC
HCT: 29.2 % — ABNORMAL LOW (ref 36.0–46.0)
Hemoglobin: 8.6 g/dL — ABNORMAL LOW (ref 12.0–15.0)
MCH: 27.5 pg (ref 26.0–34.0)
MCHC: 29.5 g/dL — ABNORMAL LOW (ref 30.0–36.0)
MCV: 93.3 fL (ref 80.0–100.0)
Platelets: 211 10*3/uL (ref 150–400)
RBC: 3.13 MIL/uL — ABNORMAL LOW (ref 3.87–5.11)
RDW: 16.7 % — ABNORMAL HIGH (ref 11.5–15.5)
WBC: 5.4 10*3/uL (ref 4.0–10.5)
nRBC: 0 % (ref 0.0–0.2)

## 2020-05-09 LAB — MRSA PCR SCREENING

## 2020-05-09 MED ORDER — SODIUM CHLORIDE 0.9 % IV SOLN: 100.0000 mL | INTRAVENOUS | Status: DC | PRN

## 2020-05-09 MED ORDER — HEPARIN SODIUM (PORCINE) 1000 UNIT/ML DIALYSIS
1000.0000 [IU] | INTRAMUSCULAR | Status: DC | PRN
  Filled 2020-05-09: qty 1

## 2020-05-09 MED ORDER — MIDAZOLAM HCL 2 MG/2ML IJ SOLN
INTRAMUSCULAR | Status: AC | PRN
  Administered 2020-05-09 (×2): 1 mg via INTRAVENOUS

## 2020-05-09 MED ORDER — ALTEPLASE 2 MG IJ SOLR: 2.0000 mg | Freq: Once | INTRAMUSCULAR | Status: DC | PRN

## 2020-05-09 MED ORDER — CEFAZOLIN SODIUM-DEXTROSE 1-4 GM/50ML-% IV SOLN: 1.0000 g | Freq: Once | INTRAVENOUS | Status: DC

## 2020-05-09 MED ORDER — PROMETHAZINE HCL 25 MG/ML IJ SOLN
12.5000 mg | Freq: Once | INTRAMUSCULAR | Status: AC
  Administered 2020-05-09: 12.5 mg via INTRAVENOUS

## 2020-05-09 MED ORDER — LIDOCAINE HCL 1 % IJ SOLN
INTRAMUSCULAR | Status: AC | PRN
  Administered 2020-05-09: 12 mL

## 2020-05-09 MED ORDER — FENTANYL CITRATE (PF) 100 MCG/2ML IJ SOLN
INTRAMUSCULAR | Status: AC | PRN
  Administered 2020-05-09 (×3): 50 ug via INTRAVENOUS

## 2020-05-09 MED ORDER — CEFAZOLIN SODIUM-DEXTROSE 2-4 GM/100ML-% IV SOLN
2.0000 g | Freq: Once | INTRAVENOUS | Status: AC
  Administered 2020-05-09: 2 g via INTRAVENOUS

## 2020-05-09 NOTE — Progress Notes (Signed)
Patient's chart and discharge summary reviewed.  Hemodynamically stable and in no acute distress.  Tolerated placement of right IJ tunnel hemodialysis catheter placement without any problems or complications.  Posteriorly to catheter placement receive hemodialysis treatment, which was terminated at 30 minutes before usual time as per patient request.  She expressed having some discomfort in the right upper chest area around where the catheter was inserted.  Oxycodone and Zofran posttreatment was provided.  Patient felt ready to go home and continue outpatient treatment as an outpatient.  Please refer to discharge summary written by Dr. Manuella Ghazi on 05/07/2020 for further info/details as part of outpatient plan of care and discharge medications.  Barton Dubois MD [phone]

## 2020-05-09 NOTE — Sedation Documentation (Signed)
Patient denies nausea. 

## 2020-05-09 NOTE — Progress Notes (Signed)
Patient ID: [name], female   DOB: 05/13/1991, 29 y.o.   MRN: [id number] S: pt returned from [facility] IR s/p RIJ [place].  Seen on initiation of HD with good aspiration and flush. O:BP (!) 171/89   Pulse (!) 107   Temp 98.8 F (37.1 C) (Oral)   Resp 18   Wt 29.5 kg   SpO2 97%   BMI 79.34 kg/m  No intake or output data in the 24 hours ending 05/09/20 1414 Intake/Output: I/O last 3 completed shifts: In: 750 [I.V.:700; IV Piggyback:50] Out: 510 [Urine:500; Blood:10]  Intake/Output this shift:  No intake/output data recorded. Weight change:  Gen: NAD CVS: tachy at 107 Resp: cta Abd: benign Ext: no edema  Recent Labs  Lab 05/06/20 0046 05/07/20 0607  NA 142 137  K 3.7 3.9  CL 97* 92*  CO2 26 26  GLUCOSE 90 73  BUN 55* 60*  CREATININE 9.05* 10.41*  ALBUMIN 3.6  --   CALCIUM 9.7 8.7*  AST 22  --   ALT 18  --    Liver Function Tests: Recent Labs  Lab 05/06/20 0046  AST 22  ALT 18  ALKPHOS 46  BILITOT 0.9  PROT 6.9  ALBUMIN 3.6   Recent Labs  Lab 05/06/20 0046  LIPASE 44   No results for input(s): AMMONIA in the last 168 hours. CBC: Recent Labs  Lab 05/06/20 0046 05/07/20 0607  WBC 5.6 4.1  HGB 9.7* 9.1*  HCT 31.5* 30.1*  MCV 92.1 91.8  PLT 225 221   Cardiac Enzymes: No results for input(s): CKTOTAL, CKMB, CKMBINDEX, TROPONINI in the last 168 hours. CBG: No results for input(s): GLUCAP in the last 168 hours.  Iron Studies: No results for input(s): IRON, TIBC, TRANSFERRIN, FERRITIN in the last 72 hours. Studies/Results: IR Fluoro Guide CV Line Right  Result Date: 05/09/2020 CLINICAL DATA:  End-stage renal disease and malfunctioning left arm AV fistula. Inability to place tunneled dialysis catheter yesterday from a right jugular approach due to inability to advance a guidewire into the central veins. The patient has been transferred for attempted dialysis catheter placement. EXAM: TUNNELED CENTRAL VENOUS HEMODIALYSIS CATHETER PLACEMENT WITH ULTRASOUND  AND FLUOROSCOPIC GUIDANCE ANESTHESIA/SEDATION: 2.0 mg IV Versed; 150 mcg IV Fentanyl. Total Moderate Sedation Time:   36 minutes. The patient's level of consciousness and physiologic status were continuously monitored during the procedure by Radiology nursing. MEDICATIONS: 2 g IV Ancef. 12.5 mg of IV Phenergan was also administered just prior to the procedure for nausea. FLUOROSCOPY TIME:  4 minutes and 30 seconds.  130 mGy. PROCEDURE: The procedure, risks, benefits, and alternatives were explained to the patient. Questions regarding the procedure were encouraged and answered. The patient understands and consents to the procedure. A timeout was performed prior to initiating the procedure. Ultrasound was used to confirm patency of the right internal jugular vein. The right neck and chest were prepped with chlorhexidine in a sterile fashion, and a sterile drape was applied covering the operative field. Maximum barrier sterile technique with sterile gowns and gloves were used for the procedure. Local anesthesia was provided with 1% lidocaine. After creating a small venotomy incision, a 21 gauge needle was advanced into the right internal jugular vein under direct, real-time ultrasound guidance. Ultrasound image documentation was performed. After securing guidewire access, an 8 Fr dilator was placed. A J-wire was kinked to measure appropriate catheter length. A Palindrome tunneled hemodialysis catheter measuring 19 cm from tip to cuff was chosen for placement. This was tunneled  in a retrograde fashion from the chest wall to the venotomy incision. At the venotomy, serial dilatation was performed and a 15 Fr peel-away sheath was placed over a guidewire. The catheter was then placed through the sheath and the sheath removed. Final catheter positioning was confirmed and documented with a fluoroscopic spot image. The catheter was aspirated, flushed with saline, and injected with appropriate volume heparin dwells. The  venotomy incision was closed with subcutaneous 3-0 Monocryl and subcuticular 4-0 Vicryl. Dermabond was applied to the incision. The catheter exit site was secured with 0-Prolene retention sutures. COMPLICATIONS: None.  No pneumothorax. FINDINGS: There was initially some difficulty in advancing the small micro guidewire beyond the base of the jugular vein likely due to a combination of tortuosity and a valve. A Nitrex wire was able to negotiate the tortuous segment and gain access into the SVC and right atrium allowing eventual catheter placement. After catheter placement, the tip lies in the right atrium. The catheter aspirates normally and is ready for immediate use. IMPRESSION: Placement of tunneled hemodialysis catheter via the right internal jugular vein. The catheter tip lies in the right atrium. The catheter is ready for immediate use. Electronically Signed   By: Aletta Edouard M.D.   On: 05/09/2020 13:27   IR US Guide Vasc Access Right  Result Date: 05/09/2020 CLINICAL DATA:  End-stage renal disease and malfunctioning left arm AV fistula. Inability to place tunneled dialysis catheter yesterday from a right jugular approach due to inability to advance a guidewire into the central veins. The patient has been transferred for attempted dialysis catheter placement. EXAM: TUNNELED CENTRAL VENOUS HEMODIALYSIS CATHETER PLACEMENT WITH ULTRASOUND AND FLUOROSCOPIC GUIDANCE ANESTHESIA/SEDATION: 2.0 mg IV Versed; 150 mcg IV Fentanyl. Total Moderate Sedation Time:   36 minutes. The patient's level of consciousness and physiologic status were continuously monitored during the procedure by Radiology nursing. MEDICATIONS: 2 g IV Ancef. 12.5 mg of IV Phenergan was also administered just prior to the procedure for nausea. FLUOROSCOPY TIME:  4 minutes and 30 seconds.  130 mGy. PROCEDURE: The procedure, risks, benefits, and alternatives were explained to the patient. Questions regarding the procedure were encouraged and  answered. The patient understands and consents to the procedure. A timeout was performed prior to initiating the procedure. Ultrasound was used to confirm patency of the right internal jugular vein. The right neck and chest were prepped with chlorhexidine in a sterile fashion, and a sterile drape was applied covering the operative field. Maximum barrier sterile technique with sterile gowns and gloves were used for the procedure. Local anesthesia was provided with 1% lidocaine. After creating a small venotomy incision, a 21 gauge needle was advanced into the right internal jugular vein under direct, real-time ultrasound guidance. Ultrasound image documentation was performed. After securing guidewire access, an 8 Fr dilator was placed. A J-wire was kinked to measure appropriate catheter length. A Palindrome tunneled hemodialysis catheter measuring 19 cm from tip to cuff was chosen for placement. This was tunneled in a retrograde fashion from the chest wall to the venotomy incision. At the venotomy, serial dilatation was performed and a 15 Fr peel-away sheath was placed over a guidewire. The catheter was then placed through the sheath and the sheath removed. Final catheter positioning was confirmed and documented with a fluoroscopic spot image. The catheter was aspirated, flushed with saline, and injected with appropriate volume heparin dwells. The venotomy incision was closed with subcutaneous 3-0 Monocryl and subcuticular 4-0 Vicryl. Dermabond was applied to the incision. The catheter exit  site was secured with 0-Prolene retention sutures. COMPLICATIONS: None.  No pneumothorax. FINDINGS: There was initially some difficulty in advancing the small micro guidewire beyond the base of the jugular vein likely due to a combination of tortuosity and a valve. A Nitrex wire was able to negotiate the tortuous segment and gain access into the SVC and right atrium allowing eventual catheter placement. After catheter placement,  the tip lies in the right atrium. The catheter aspirates normally and is ready for immediate use. IMPRESSION: Placement of tunneled hemodialysis catheter via the right internal jugular vein. The catheter tip lies in the right atrium. The catheter is ready for immediate use. Electronically Signed   By: Aletta Edouard M.D.   On: 05/09/2020 13:27   DG Chest Port 1 View  Result Date: 05/08/2020 CLINICAL DATA:  Attempted right-sided dialysis catheter.  ESRD. EXAM: PORTABLE CHEST 1 VIEW COMPARISON:  CT chest 03/31/2020 and chest radiograph 04/20/2020 FINDINGS: Mild enlargement of the cardiac silhouette. Pulmonary vascular congestion. No consolidation. No significant pneumothorax on this limited low volume AP portable supine radiograph. No visible pleural effusions. No consolidation. Multiple vertebral segmentation anomalies are suspected. IMPRESSION: 1. No significant pneumothorax on this limited low volume AP portable supine radiograph. 2. Mild cardiomegaly and pulmonary vascular congestion. 3. Diffuse mild interstitial opacities with hazy retrocardiac opacity, possibly representing edema and/or infection. 4. Multiple vertebral segmentation anomalies are suspected. Electronically Signed   By: Margaretha Sheffield MD   On: 05/08/2020 13:57   DG C-Arm 1-60 Min-No Report  Result Date: 05/08/2020 Fluoroscopy was utilized by the requesting physician.  No radiographic interpretation.   Marland Kitchen amLODipine  5 mg Oral QHS  . amoxicillin-clavulanate  1 tablet Oral Daily  . B-complex with vitamin C  1 tablet Oral Daily  . Chlorhexidine Gluconate Cloth  6 each Topical Q0600  . ferric citrate  420 mg Oral TID WC  . heparin  5,000 Units Subcutaneous Q8H  . hydrALAZINE  50 mg Oral TID  . metoCLOPramide  5 mg Oral QID  . metoprolol succinate  50 mg Oral Daily  . montelukast  10 mg Oral QHS  . multivitamin  1 tablet Oral Once per day on Tue Thu Sat  . pantoprazole (PROTONIX) IV  40 mg Intravenous Q24H  . sodium chloride  flush  3 mL Intravenous Q12H    BMET    Component Value Date/Time   NA 137 05/07/2020 0607   K 3.9 05/07/2020 0607   CL 92 (L) 05/07/2020 0607   CO2 26 05/07/2020 0607   GLUCOSE 73 05/07/2020 0607   BUN 60 (H) 05/07/2020 0607   CREATININE 10.41 (H) 05/07/2020 0607   CALCIUM 8.7 (L) 05/07/2020 0607   GFRNONAA 5 (L) 05/07/2020 0607   GFRAA 9 (L) 04/30/2020 0455   CBC    Component Value Date/Time   WBC 4.1 05/07/2020 0607   RBC 3.28 (L) 05/07/2020 0607   HGB 9.1 (L) 05/07/2020 0607   HCT 30.1 (L) 05/07/2020 0607   PLT 221 05/07/2020 0607   MCV 91.8 05/07/2020 0607   MCH 27.7 05/07/2020 0607   MCHC 30.2 05/07/2020 0607   RDW 17.3 (H) 05/07/2020 0607   LYMPHSABS 1.4 04/29/2020 1720   MONOABS 0.6 04/29/2020 1720   EOSABS 0.1 04/29/2020 1720   BASOSABS 0.0 04/29/2020 1720      Dialysis prescription: [facility] TTS, 2K, 2.25Ca, 3 hours, edw 27.3kg, BFR 350, DFR A1.5, UF profile 4, LUE AVF 16 gauge needles, micera 100 mcg every 2 w2eeks during HD,  hectoral 71mcg IVP 2 x week, was on fortaz, no heparin.  Assessment/Plan:  1. UTI- improved. Cultures pending currently on zosyn 2. ESRDnormally TTS but infiltrated on Saturday and again on Monday. 1. S/p TDC placement today at [facility] IR. 2. Unclear why her AVF infiltrated twice when she had a normal fistulogram on Friday.  Will refer back to [facility] for further evaluation as an outpatient 3. Will keep [place] in place for at least a week to rest AVF 4. Should be stable for discharge following HD and f/u with outpatient HD as scheduled. 3. 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 Nutrition:renal diet 6. Hypertension:stable 7. Disposition- hopeful discharge today after HD.  Donetta Potts, MD [employer] [phone]

## 2020-05-09 NOTE — Procedures (Signed)
     HEMODIALYSIS TREATMENT NOTE:  2.5 hour heparin-free HD session completed via RIJ TDC.  Cath tolerated prescribed flow 400cc/m with stable pressures.  Goal not met: pt requested to end session 30 minutes early. 645cc removed.  All blood was returned. C/o chest discomfort from [facility] insertion earlier today; requested Oxy and Zofran post-treatment.  Rockwell Alexandria, RN

## 2020-05-09 NOTE — Sedation Documentation (Signed)
Patient states has nausea. Doctor notified ordered medication.

## 2020-05-09 NOTE — Consult Note (Signed)
Chief Complaint: Patient was seen in consultation today for end stage renal disease  Referring Physician(s): Dr. Constance Haw  Supervising Physician: Aletta Edouard  Patient Status: [facility] - In-pt  History of Present Illness: [name] is a 29 y.o. female with past medical history of spina bifida, GERD, HTN, ESRD on dialysis via L AVF who has not been able to undergo successful dialysis at last 2 attempts due to AVF infiltration.  She was taken to the OR yesterday at [facility] for attempted R IJ tunneled catheter, however unsuccessful. She is now referred to IR for tunneled dialysis catheter placement.   Patient assessed at bedside.  She states she previously had a tunneled HD catheter which she believes was on the right side.  Now has been using the L AVF without issues until recently.  States she is ready to go home as soon as access established and dialysis completed.  She has been NPO this AM.   Past Medical History:  Diagnosis Date  . Anemia associated with chronic renal failure   . Blood transfusion   . 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  . Depression 05/14/2015  . Dialysis care   . ESRD (end stage renal disease) on dialysis ([place])   . GERD (gastroesophageal reflux disease) 01/07/2017  . HTN (hypertension) 05/02/2011  . Spina bifida   . UTI (lower urinary tract infection)     Past Surgical History:  Procedure Laterality Date  . AV FISTULA PLACEMENT     left arm  . INSERTION OF DIALYSIS CATHETER N/A 05/08/2020   Procedure: ATTEMPTED, UNSUCCESSFUL INSERTION OF DIALYSIS CATHETER TUNNELED RIGHT INTERNAL JUGULAR;  Surgeon: Virl Cagey, MD;  Location: [facility]S;  Service: General;  Laterality: N/A;    Allergies: Ciprofloxacin; Other; Peanut-containing drug products; Aleve [naproxen sodium]; Ceftriaxone; Coconut oil; Influenza vaccines; Tetanus toxoid, adsorbed; Tetanus toxoids; and Latex  Medications: Prior to Admission medications   Medication Sig  Start Date End Date Taking? Authorizing Provider  acetaminophen (TYLENOL) 500 MG tablet Take 1,000 mg by mouth every 6 (six) hours as needed for mild pain or headache.   Yes [provider]  amLODipine (NORVASC) 5 MG tablet Take 5 mg by mouth at bedtime. 04/05/20  Yes [provider]  AURYXIA 1 GM 210 MG(Fe) tablet Take 2 tablets by mouth 3 (three) times daily with meals. 04/15/18  Yes [provider]  B Complex-C-Folic Acid (DIALYVITE 262) 0.8 MG TABS Take 0.8 mg by mouth daily.   Yes [provider]  diphenhydrAMINE (BENADRYL) 25 mg capsule Take 25 mg by mouth daily as needed for allergies.   Yes [provider]  Epoetin Alfa (EPOGEN IJ) Inject as directed See admin instructions. Every Tuesday, Thursday, Saturday   Yes [provider]  hydrALAZINE (APRESOLINE) 50 MG tablet Take 50 mg by mouth 3 (three) times daily. 04/28/20  Yes [provider]  metoCLOPramide (REGLAN) 5 MG tablet Take 1 tablet (5 mg total) by mouth 4 (four) times daily. Take one tablet 1-2 hours before dialysis 11/02/19  Yes Danis, Estill Cotta III, MD  metoprolol succinate (TOPROL-XL) 50 MG 24 hr tablet Take 50 mg by mouth daily. 12/29/19  Yes [provider]  montelukast (SINGULAIR) 10 MG tablet Take 10 mg by mouth at bedtime. 04/27/20  Yes [provider]  multivitamin (RENA-VIT) TABS tablet Take 1 tablet by mouth See admin instructions. Every Tuesday, Thursday, Saturday   Yes [provider]  omeprazole (PRILOSEC) 20 MG capsule  Take 20 mg by mouth daily.  03/26/16  Yes [provider]  amoxicillin-clavulanate (AUGMENTIN) 500-125 MG tablet Take 1 tablet (500 mg total) by mouth daily for 5 days. 05/01/20 #6DS 05/07/20 05/12/20  Heath Lark D, DO     Family History  Problem Relation Age of Onset  . Kidney cancer Other   . Hypertension Maternal Grandmother   . Arthritis Maternal Grandmother   . Breast cancer Maternal Aunt   . Colon cancer Neg  Hx     Social History   Socioeconomic History  . Marital status: Single    Spouse name: Not on file  . Number of children: Not on file  . Years of education: Not on file  . Highest education level: Not on file  Occupational History  . Not on file  Tobacco Use  . Smoking status: Former Smoker    Packs/day: 0.10    Types: Cigarettes  . Smokeless tobacco: Never Used  . Tobacco comment: 2 cigs a day  Vaping Use  . Vaping Use: Never used  Substance and Sexual Activity  . Alcohol use: No  . Drug use: No  . Sexual activity: Never  Other Topics Concern  . Not on file  Social History Narrative  . Not on file   Social Determinants of Health   Financial Resource Strain:   . Difficulty of Paying Living Expenses: Not on file  Food Insecurity:   . Worried About Charity fundraiser in the Last Year: Not on file  . Ran Out of Food in the Last Year: Not on file  Transportation Needs:   . Lack of Transportation (Medical): Not on file  . Lack of Transportation (Non-Medical): Not on file  Physical Activity:   . Days of Exercise per Week: Not on file  . Minutes of Exercise per Session: Not on file  Stress:   . Feeling of Stress : Not on file  Social Connections:   . Frequency of Communication with Friends and Family: Not on file  . Frequency of Social Gatherings with Friends and Family: Not on file  . Attends Religious Services: Not on file  . Active Member of Clubs or Organizations: Not on file  . Attends Archivist Meetings: Not on file  . Marital Status: Not on file     Review of Systems: A 12 point ROS discussed and pertinent positives are indicated in the HPI above.  All other systems are negative.  Review of Systems  Constitutional: Negative for fatigue and fever.  Respiratory: Negative for cough and shortness of breath.   Cardiovascular: Negative for chest pain.  Gastrointestinal: Negative for abdominal pain, nausea and vomiting.  Musculoskeletal: Negative  for back pain.  Psychiatric/Behavioral: Negative for behavioral problems and confusion.    Vital Signs: BP 130/66 (BP Location: Right Arm)   Pulse 94   Temp 98.8 F (37.1 C) (Oral)   Resp 20   Wt 65 lb (29.5 kg)   SpO2 97%   BMI 79.34 kg/m   Physical Exam Vitals and nursing note reviewed.  Constitutional:      Appearance: Normal appearance.  HENT:     Mouth/Throat:     Mouth: Mucous membranes are moist.     Pharynx: Oropharynx is clear.  Neck:     Comments: Band-aid to right neck at prior puncture site.  Multiple scar/acne to chest.  Difficult to discern prior scar from previous tunneled HD catheter- right vs. Left? Cardiovascular:     Rate  and Rhythm: Normal rate and regular rhythm.  Pulmonary:     Effort: Pulmonary effort is normal.     Breath sounds: Normal breath sounds.  Abdominal:     General: Abdomen is flat.     Palpations: Abdomen is soft.  Musculoskeletal:     Cervical back: Normal range of motion and neck supple.  Skin:    General: Skin is warm and dry.  Neurological:     General: No focal deficit present.     Mental Status: She is alert and oriented to person, place, and time.  Psychiatric:        Mood and Affect: Mood normal.        Behavior: Behavior normal.        Thought Content: Thought content normal.        Judgment: Judgment normal.      MD Evaluation Airway: WNL Heart: WNL Abdomen: WNL Chest/ Lungs: WNL ASA  Classification: 3 Mallampati/Airway Score: Two   Imaging: CT Head Wo Contrast  Result Date: 04/29/2020 CLINICAL DATA:  Headache for 3 days with nausea and vomiting EXAM: CT HEAD WITHOUT CONTRAST TECHNIQUE: Contiguous axial images were obtained from the base of the skull through the vertex without intravenous contrast. COMPARISON:  05/07/2018 FINDINGS: Brain: There is no mass, hemorrhage or extra-axial collection. The size and configuration of the ventricles and extra-axial CSF spaces are normal. The brain parenchyma is normal,  without acute or chronic infarction. Vascular: No abnormal hyperdensity of the major intracranial arteries or dural venous sinuses. No intracranial atherosclerosis. Skull: The visualized skull base, calvarium and extracranial soft tissues are normal. Sinuses/Orbits: No fluid levels or advanced mucosal thickening of the visualized paranasal sinuses. No mastoid or middle ear effusion. The orbits are normal. IMPRESSION: Normal head CT. Electronically Signed   By: Ulyses Jarred M.D.   On: 04/29/2020 22:00   DG Chest Port 1 View  Result Date: 05/08/2020 CLINICAL DATA:  Attempted right-sided dialysis catheter.  ESRD. EXAM: PORTABLE CHEST 1 VIEW COMPARISON:  CT chest 03/31/2020 and chest radiograph 04/20/2020 FINDINGS: Mild enlargement of the cardiac silhouette. Pulmonary vascular congestion. No consolidation. No significant pneumothorax on this limited low volume AP portable supine radiograph. No visible pleural effusions. No consolidation. Multiple vertebral segmentation anomalies are suspected. IMPRESSION: 1. No significant pneumothorax on this limited low volume AP portable supine radiograph. 2. Mild cardiomegaly and pulmonary vascular congestion. 3. Diffuse mild interstitial opacities with hazy retrocardiac opacity, possibly representing edema and/or infection. 4. Multiple vertebral segmentation anomalies are suspected. Electronically Signed   By: Margaretha Sheffield MD   On: 05/08/2020 13:57   DG C-Arm 1-60 Min-No Report  Result Date: 05/08/2020 Fluoroscopy was utilized by the requesting physician.  No radiographic interpretation.    Labs:  CBC: Recent Labs    04/29/20 1720 04/30/20 0455 05/06/20 0046 05/07/20 0607  WBC 6.2 6.7 5.6 4.1  HGB 11.2* 9.9* 9.7* 9.1*  HCT 37.5 32.8* 31.5* 30.1*  PLT 267 255 225 221    COAGS: No results for input(s): INR, APTT in the last 8760 hours.  BMP: Recent Labs    11/05/19 1601 11/05/19 1601 03/31/20 0153 03/31/20 0153 04/29/20 1720 04/30/20 0455  05/06/20 0046 05/07/20 0607  NA 135   < > 140   < > 143 144 142 137  K 4.8   < > 4.0   < > 3.2* 3.2* 3.7 3.9  CL 98   < > 100   < > 96* 100 97* 92*  CO2 22   < >  24   < > 30 26 26 26   GLUCOSE 88   < > 112*   < > 94 71 90 73  BUN 54*   < > 48*   < > 38* 41* 55* 60*  CALCIUM 9.3   < > 8.6*   < > 9.8 9.3 9.7 8.7*  CREATININE 10.55*   < > 7.18*   < > 6.17* 6.86* 9.05* 10.41*  GFRNONAA 5*   < > 7*   < > 9* 8* 5* 5*  GFRAA 5*  --  8*  --  10* 9*  --   --    < > = values in this interval not displayed.    LIVER FUNCTION TESTS: Recent Labs    11/05/19 1601 03/31/20 0358 04/30/20 0455 05/06/20 0046  BILITOT 0.5 0.5 0.8 0.9  AST 11* 19 15 22   ALT 8 26 15 18   ALKPHOS 53 48 46 46  PROT 7.6 5.8* 6.4* 6.9  ALBUMIN 2.9* 2.7* 3.3* 3.6    TUMOR MARKERS: No results for input(s): AFPTM, CEA, CA199, CHROMGRNA in the last 8760 hours.  Assessment and Plan: End stage renal disease Patient dialysis dependent via L AVF, currently non-functioning.  R IJ tunneled catheter was attempted yesterday without success.  Patient referred to IR for tunneled HD placement.  She has been NPO.  She is agreeable to placement today.  Discussed height/weight with pharmacy to confirm correct dosage of prophylactic antibiotic. Plan to give 2g Ancef with procedure today in anticipation of dialysis later this evening.   Risks and benefits discussed with the patient including, but not limited to bleeding, infection, vascular injury, pneumothorax which may require chest tube placement, air embolism or even death  All of the patient's questions were answered, patient is agreeable to proceed. Consent signed and in chart.  Thank you for this interesting consult.  I greatly enjoyed meeting Zayana A Stults and look forward to participating in their care.  A copy of this report was sent to the requesting provider on this date.  Electronically Signed: Docia Barrier, PA 05/09/2020, 9:38 AM   I spent a  total of 40 Minutes    in face to face in clinical consultation, greater than 50% of which was counseling/coordinating care for end stage renal disease.

## 2020-05-09 NOTE — Procedures (Signed)
Interventional Radiology Procedure Note  Procedure: Right IJ tunneled HD catheter placement  Complications: None  Estimated Blood Loss: < 10 mL  Findings: 19 cm tip to cuff length Palindrome HD catheter placed via right IJ vein. Tip in RA. OK to use.  Venetia Night. Kathlene Cote, M.D Pager:  [phone]

## 2020-05-10 MED FILL — Ondansetron HCl Inj 4 MG/2ML (2 MG/ML): INTRAMUSCULAR | Qty: 2 | Status: AC

## 2020-05-10 MED FILL — Sodium Chloride Flush IV Soln 0.9%: INTRAVENOUS | Qty: 10 | Status: AC

## 2020-05-12 DIAGNOSIS — N186 End stage renal disease: Secondary | ICD-10-CM | POA: Diagnosis not present

## 2020-05-12 DIAGNOSIS — Z992 Dependence on renal dialysis: Secondary | ICD-10-CM | POA: Diagnosis not present

## 2020-05-12 DIAGNOSIS — D631 Anemia in chronic kidney disease: Secondary | ICD-10-CM | POA: Diagnosis not present

## 2020-05-12 DIAGNOSIS — R52 Pain, unspecified: Secondary | ICD-10-CM | POA: Diagnosis not present

## 2020-05-12 DIAGNOSIS — N2581 Secondary hyperparathyroidism of renal origin: Secondary | ICD-10-CM | POA: Diagnosis not present

## 2020-05-15 DIAGNOSIS — Z992 Dependence on renal dialysis: Secondary | ICD-10-CM | POA: Diagnosis not present

## 2020-05-15 DIAGNOSIS — D631 Anemia in chronic kidney disease: Secondary | ICD-10-CM | POA: Diagnosis not present

## 2020-05-15 DIAGNOSIS — N2581 Secondary hyperparathyroidism of renal origin: Secondary | ICD-10-CM | POA: Diagnosis not present

## 2020-05-15 DIAGNOSIS — R52 Pain, unspecified: Secondary | ICD-10-CM | POA: Diagnosis not present

## 2020-05-15 DIAGNOSIS — N186 End stage renal disease: Secondary | ICD-10-CM | POA: Diagnosis not present

## 2020-05-17 DIAGNOSIS — D631 Anemia in chronic kidney disease: Secondary | ICD-10-CM | POA: Diagnosis not present

## 2020-05-17 DIAGNOSIS — N186 End stage renal disease: Secondary | ICD-10-CM | POA: Diagnosis not present

## 2020-05-17 DIAGNOSIS — N2581 Secondary hyperparathyroidism of renal origin: Secondary | ICD-10-CM | POA: Diagnosis not present

## 2020-05-17 DIAGNOSIS — Z992 Dependence on renal dialysis: Secondary | ICD-10-CM | POA: Diagnosis not present

## 2020-05-17 DIAGNOSIS — R52 Pain, unspecified: Secondary | ICD-10-CM | POA: Diagnosis not present

## 2020-05-17 DIAGNOSIS — E039 Hypothyroidism, unspecified: Secondary | ICD-10-CM | POA: Diagnosis not present

## 2020-05-19 DIAGNOSIS — N2581 Secondary hyperparathyroidism of renal origin: Secondary | ICD-10-CM | POA: Diagnosis not present

## 2020-05-19 DIAGNOSIS — D631 Anemia in chronic kidney disease: Secondary | ICD-10-CM | POA: Diagnosis not present

## 2020-05-19 DIAGNOSIS — N186 End stage renal disease: Secondary | ICD-10-CM | POA: Diagnosis not present

## 2020-05-19 DIAGNOSIS — R52 Pain, unspecified: Secondary | ICD-10-CM | POA: Diagnosis not present

## 2020-05-19 DIAGNOSIS — Z992 Dependence on renal dialysis: Secondary | ICD-10-CM | POA: Diagnosis not present

## 2020-05-22 DIAGNOSIS — N2581 Secondary hyperparathyroidism of renal origin: Secondary | ICD-10-CM | POA: Diagnosis not present

## 2020-05-22 DIAGNOSIS — D631 Anemia in chronic kidney disease: Secondary | ICD-10-CM | POA: Diagnosis not present

## 2020-05-22 DIAGNOSIS — Z992 Dependence on renal dialysis: Secondary | ICD-10-CM | POA: Diagnosis not present

## 2020-05-22 DIAGNOSIS — N186 End stage renal disease: Secondary | ICD-10-CM | POA: Diagnosis not present

## 2020-05-22 DIAGNOSIS — R52 Pain, unspecified: Secondary | ICD-10-CM | POA: Diagnosis not present

## 2020-05-23 DIAGNOSIS — Z452 Encounter for adjustment and management of vascular access device: Secondary | ICD-10-CM | POA: Diagnosis not present

## 2020-05-24 DIAGNOSIS — N186 End stage renal disease: Secondary | ICD-10-CM | POA: Diagnosis not present

## 2020-05-24 DIAGNOSIS — Z992 Dependence on renal dialysis: Secondary | ICD-10-CM | POA: Diagnosis not present

## 2020-05-24 DIAGNOSIS — D631 Anemia in chronic kidney disease: Secondary | ICD-10-CM | POA: Diagnosis not present

## 2020-05-24 DIAGNOSIS — R52 Pain, unspecified: Secondary | ICD-10-CM | POA: Diagnosis not present

## 2020-05-24 DIAGNOSIS — N2581 Secondary hyperparathyroidism of renal origin: Secondary | ICD-10-CM | POA: Diagnosis not present

## 2020-05-26 DIAGNOSIS — Z992 Dependence on renal dialysis: Secondary | ICD-10-CM | POA: Diagnosis not present

## 2020-05-26 DIAGNOSIS — D631 Anemia in chronic kidney disease: Secondary | ICD-10-CM | POA: Diagnosis not present

## 2020-05-26 DIAGNOSIS — N2581 Secondary hyperparathyroidism of renal origin: Secondary | ICD-10-CM | POA: Diagnosis not present

## 2020-05-26 DIAGNOSIS — N186 End stage renal disease: Secondary | ICD-10-CM | POA: Diagnosis not present

## 2020-05-26 DIAGNOSIS — R52 Pain, unspecified: Secondary | ICD-10-CM | POA: Diagnosis not present

## 2020-05-28 DIAGNOSIS — Z992 Dependence on renal dialysis: Secondary | ICD-10-CM | POA: Diagnosis not present

## 2020-05-28 DIAGNOSIS — I120 Hypertensive chronic kidney disease with stage 5 chronic kidney disease or end stage renal disease: Secondary | ICD-10-CM | POA: Diagnosis not present

## 2020-05-28 DIAGNOSIS — N186 End stage renal disease: Secondary | ICD-10-CM | POA: Diagnosis not present

## 2020-05-29 DIAGNOSIS — I10 Essential (primary) hypertension: Secondary | ICD-10-CM | POA: Diagnosis not present

## 2020-05-30 DIAGNOSIS — D631 Anemia in chronic kidney disease: Secondary | ICD-10-CM | POA: Diagnosis not present

## 2020-05-30 DIAGNOSIS — N2581 Secondary hyperparathyroidism of renal origin: Secondary | ICD-10-CM | POA: Diagnosis not present

## 2020-05-30 DIAGNOSIS — N186 End stage renal disease: Secondary | ICD-10-CM | POA: Diagnosis not present

## 2020-05-30 DIAGNOSIS — Z23 Encounter for immunization: Secondary | ICD-10-CM | POA: Diagnosis not present

## 2020-05-30 DIAGNOSIS — Z992 Dependence on renal dialysis: Secondary | ICD-10-CM | POA: Diagnosis not present

## 2020-05-31 DIAGNOSIS — N186 End stage renal disease: Secondary | ICD-10-CM | POA: Diagnosis not present

## 2020-05-31 DIAGNOSIS — Z992 Dependence on renal dialysis: Secondary | ICD-10-CM | POA: Diagnosis not present

## 2020-05-31 DIAGNOSIS — Z23 Encounter for immunization: Secondary | ICD-10-CM | POA: Diagnosis not present

## 2020-05-31 DIAGNOSIS — N2581 Secondary hyperparathyroidism of renal origin: Secondary | ICD-10-CM | POA: Diagnosis not present

## 2020-05-31 DIAGNOSIS — D631 Anemia in chronic kidney disease: Secondary | ICD-10-CM | POA: Diagnosis not present

## 2020-06-02 DIAGNOSIS — N186 End stage renal disease: Secondary | ICD-10-CM | POA: Diagnosis not present

## 2020-06-02 DIAGNOSIS — N2581 Secondary hyperparathyroidism of renal origin: Secondary | ICD-10-CM | POA: Diagnosis not present

## 2020-06-02 DIAGNOSIS — D631 Anemia in chronic kidney disease: Secondary | ICD-10-CM | POA: Diagnosis not present

## 2020-06-02 DIAGNOSIS — Z23 Encounter for immunization: Secondary | ICD-10-CM | POA: Diagnosis not present

## 2020-06-02 DIAGNOSIS — Z992 Dependence on renal dialysis: Secondary | ICD-10-CM | POA: Diagnosis not present

## 2020-06-05 DIAGNOSIS — D631 Anemia in chronic kidney disease: Secondary | ICD-10-CM | POA: Diagnosis not present

## 2020-06-05 DIAGNOSIS — Z23 Encounter for immunization: Secondary | ICD-10-CM | POA: Diagnosis not present

## 2020-06-05 DIAGNOSIS — N186 End stage renal disease: Secondary | ICD-10-CM | POA: Diagnosis not present

## 2020-06-05 DIAGNOSIS — Z992 Dependence on renal dialysis: Secondary | ICD-10-CM | POA: Diagnosis not present

## 2020-06-05 DIAGNOSIS — N2581 Secondary hyperparathyroidism of renal origin: Secondary | ICD-10-CM | POA: Diagnosis not present

## 2020-06-07 DIAGNOSIS — Z992 Dependence on renal dialysis: Secondary | ICD-10-CM | POA: Diagnosis not present

## 2020-06-07 DIAGNOSIS — Z23 Encounter for immunization: Secondary | ICD-10-CM | POA: Diagnosis not present

## 2020-06-07 DIAGNOSIS — D631 Anemia in chronic kidney disease: Secondary | ICD-10-CM | POA: Diagnosis not present

## 2020-06-07 DIAGNOSIS — N2581 Secondary hyperparathyroidism of renal origin: Secondary | ICD-10-CM | POA: Diagnosis not present

## 2020-06-07 DIAGNOSIS — N186 End stage renal disease: Secondary | ICD-10-CM | POA: Diagnosis not present

## 2020-06-09 DIAGNOSIS — N186 End stage renal disease: Secondary | ICD-10-CM | POA: Diagnosis not present

## 2020-06-09 DIAGNOSIS — Z23 Encounter for immunization: Secondary | ICD-10-CM | POA: Diagnosis not present

## 2020-06-09 DIAGNOSIS — Z992 Dependence on renal dialysis: Secondary | ICD-10-CM | POA: Diagnosis not present

## 2020-06-09 DIAGNOSIS — N2581 Secondary hyperparathyroidism of renal origin: Secondary | ICD-10-CM | POA: Diagnosis not present

## 2020-06-09 DIAGNOSIS — D631 Anemia in chronic kidney disease: Secondary | ICD-10-CM | POA: Diagnosis not present

## 2020-06-12 DIAGNOSIS — N186 End stage renal disease: Secondary | ICD-10-CM | POA: Diagnosis not present

## 2020-06-12 DIAGNOSIS — Z23 Encounter for immunization: Secondary | ICD-10-CM | POA: Diagnosis not present

## 2020-06-12 DIAGNOSIS — N2581 Secondary hyperparathyroidism of renal origin: Secondary | ICD-10-CM | POA: Diagnosis not present

## 2020-06-12 DIAGNOSIS — D631 Anemia in chronic kidney disease: Secondary | ICD-10-CM | POA: Diagnosis not present

## 2020-06-12 DIAGNOSIS — Z992 Dependence on renal dialysis: Secondary | ICD-10-CM | POA: Diagnosis not present

## 2020-06-14 DIAGNOSIS — N186 End stage renal disease: Secondary | ICD-10-CM | POA: Diagnosis not present

## 2020-06-14 DIAGNOSIS — Z992 Dependence on renal dialysis: Secondary | ICD-10-CM | POA: Diagnosis not present

## 2020-06-14 DIAGNOSIS — D631 Anemia in chronic kidney disease: Secondary | ICD-10-CM | POA: Diagnosis not present

## 2020-06-14 DIAGNOSIS — N2581 Secondary hyperparathyroidism of renal origin: Secondary | ICD-10-CM | POA: Diagnosis not present

## 2020-06-14 DIAGNOSIS — Z23 Encounter for immunization: Secondary | ICD-10-CM | POA: Diagnosis not present

## 2020-06-16 DIAGNOSIS — D631 Anemia in chronic kidney disease: Secondary | ICD-10-CM | POA: Diagnosis not present

## 2020-06-16 DIAGNOSIS — N2581 Secondary hyperparathyroidism of renal origin: Secondary | ICD-10-CM | POA: Diagnosis not present

## 2020-06-16 DIAGNOSIS — Z992 Dependence on renal dialysis: Secondary | ICD-10-CM | POA: Diagnosis not present

## 2020-06-16 DIAGNOSIS — Z23 Encounter for immunization: Secondary | ICD-10-CM | POA: Diagnosis not present

## 2020-06-16 DIAGNOSIS — N186 End stage renal disease: Secondary | ICD-10-CM | POA: Diagnosis not present

## 2020-06-18 DIAGNOSIS — Z23 Encounter for immunization: Secondary | ICD-10-CM | POA: Diagnosis not present

## 2020-06-18 DIAGNOSIS — D631 Anemia in chronic kidney disease: Secondary | ICD-10-CM | POA: Diagnosis not present

## 2020-06-18 DIAGNOSIS — Z992 Dependence on renal dialysis: Secondary | ICD-10-CM | POA: Diagnosis not present

## 2020-06-18 DIAGNOSIS — N186 End stage renal disease: Secondary | ICD-10-CM | POA: Diagnosis not present

## 2020-06-18 DIAGNOSIS — N2581 Secondary hyperparathyroidism of renal origin: Secondary | ICD-10-CM | POA: Diagnosis not present

## 2020-06-20 DIAGNOSIS — N2581 Secondary hyperparathyroidism of renal origin: Secondary | ICD-10-CM | POA: Diagnosis not present

## 2020-06-20 DIAGNOSIS — N186 End stage renal disease: Secondary | ICD-10-CM | POA: Diagnosis not present

## 2020-06-20 DIAGNOSIS — D631 Anemia in chronic kidney disease: Secondary | ICD-10-CM | POA: Diagnosis not present

## 2020-06-20 DIAGNOSIS — Z992 Dependence on renal dialysis: Secondary | ICD-10-CM | POA: Diagnosis not present

## 2020-06-20 DIAGNOSIS — Z23 Encounter for immunization: Secondary | ICD-10-CM | POA: Diagnosis not present

## 2020-06-26 DIAGNOSIS — N2581 Secondary hyperparathyroidism of renal origin: Secondary | ICD-10-CM | POA: Diagnosis not present

## 2020-06-26 DIAGNOSIS — N186 End stage renal disease: Secondary | ICD-10-CM | POA: Diagnosis not present

## 2020-06-26 DIAGNOSIS — Z23 Encounter for immunization: Secondary | ICD-10-CM | POA: Diagnosis not present

## 2020-06-26 DIAGNOSIS — D631 Anemia in chronic kidney disease: Secondary | ICD-10-CM | POA: Diagnosis not present

## 2020-06-26 DIAGNOSIS — Z992 Dependence on renal dialysis: Secondary | ICD-10-CM | POA: Diagnosis not present

## 2020-06-27 DIAGNOSIS — Z992 Dependence on renal dialysis: Secondary | ICD-10-CM | POA: Diagnosis not present

## 2020-06-27 DIAGNOSIS — I120 Hypertensive chronic kidney disease with stage 5 chronic kidney disease or end stage renal disease: Secondary | ICD-10-CM | POA: Diagnosis not present

## 2020-06-27 DIAGNOSIS — N186 End stage renal disease: Secondary | ICD-10-CM | POA: Diagnosis not present

## 2020-06-28 DIAGNOSIS — D509 Iron deficiency anemia, unspecified: Secondary | ICD-10-CM | POA: Diagnosis not present

## 2020-06-28 DIAGNOSIS — D631 Anemia in chronic kidney disease: Secondary | ICD-10-CM | POA: Diagnosis not present

## 2020-06-28 DIAGNOSIS — N186 End stage renal disease: Secondary | ICD-10-CM | POA: Diagnosis not present

## 2020-06-28 DIAGNOSIS — N2581 Secondary hyperparathyroidism of renal origin: Secondary | ICD-10-CM | POA: Diagnosis not present

## 2020-06-28 DIAGNOSIS — N390 Urinary tract infection, site not specified: Secondary | ICD-10-CM | POA: Diagnosis not present

## 2020-06-28 DIAGNOSIS — Z992 Dependence on renal dialysis: Secondary | ICD-10-CM | POA: Diagnosis not present

## 2020-06-30 DIAGNOSIS — N2581 Secondary hyperparathyroidism of renal origin: Secondary | ICD-10-CM | POA: Diagnosis not present

## 2020-06-30 DIAGNOSIS — N186 End stage renal disease: Secondary | ICD-10-CM | POA: Diagnosis not present

## 2020-06-30 DIAGNOSIS — N390 Urinary tract infection, site not specified: Secondary | ICD-10-CM | POA: Diagnosis not present

## 2020-06-30 DIAGNOSIS — Z992 Dependence on renal dialysis: Secondary | ICD-10-CM | POA: Diagnosis not present

## 2020-06-30 DIAGNOSIS — D509 Iron deficiency anemia, unspecified: Secondary | ICD-10-CM | POA: Diagnosis not present

## 2020-07-03 DIAGNOSIS — Z992 Dependence on renal dialysis: Secondary | ICD-10-CM | POA: Diagnosis not present

## 2020-07-03 DIAGNOSIS — D509 Iron deficiency anemia, unspecified: Secondary | ICD-10-CM | POA: Diagnosis not present

## 2020-07-03 DIAGNOSIS — N390 Urinary tract infection, site not specified: Secondary | ICD-10-CM | POA: Diagnosis not present

## 2020-07-03 DIAGNOSIS — N2581 Secondary hyperparathyroidism of renal origin: Secondary | ICD-10-CM | POA: Diagnosis not present

## 2020-07-03 DIAGNOSIS — N186 End stage renal disease: Secondary | ICD-10-CM | POA: Diagnosis not present

## 2020-07-05 DIAGNOSIS — N2581 Secondary hyperparathyroidism of renal origin: Secondary | ICD-10-CM | POA: Diagnosis not present

## 2020-07-05 DIAGNOSIS — N390 Urinary tract infection, site not specified: Secondary | ICD-10-CM | POA: Diagnosis not present

## 2020-07-05 DIAGNOSIS — N186 End stage renal disease: Secondary | ICD-10-CM | POA: Diagnosis not present

## 2020-07-05 DIAGNOSIS — Z992 Dependence on renal dialysis: Secondary | ICD-10-CM | POA: Diagnosis not present

## 2020-07-05 DIAGNOSIS — D509 Iron deficiency anemia, unspecified: Secondary | ICD-10-CM | POA: Diagnosis not present

## 2020-07-07 DIAGNOSIS — Z992 Dependence on renal dialysis: Secondary | ICD-10-CM | POA: Diagnosis not present

## 2020-07-07 DIAGNOSIS — N390 Urinary tract infection, site not specified: Secondary | ICD-10-CM | POA: Diagnosis not present

## 2020-07-07 DIAGNOSIS — N2581 Secondary hyperparathyroidism of renal origin: Secondary | ICD-10-CM | POA: Diagnosis not present

## 2020-07-07 DIAGNOSIS — D509 Iron deficiency anemia, unspecified: Secondary | ICD-10-CM | POA: Diagnosis not present

## 2020-07-07 DIAGNOSIS — N186 End stage renal disease: Secondary | ICD-10-CM | POA: Diagnosis not present

## 2020-07-10 DIAGNOSIS — D509 Iron deficiency anemia, unspecified: Secondary | ICD-10-CM | POA: Diagnosis not present

## 2020-07-10 DIAGNOSIS — N2581 Secondary hyperparathyroidism of renal origin: Secondary | ICD-10-CM | POA: Diagnosis not present

## 2020-07-10 DIAGNOSIS — N390 Urinary tract infection, site not specified: Secondary | ICD-10-CM | POA: Diagnosis not present

## 2020-07-10 DIAGNOSIS — Z992 Dependence on renal dialysis: Secondary | ICD-10-CM | POA: Diagnosis not present

## 2020-07-10 DIAGNOSIS — N186 End stage renal disease: Secondary | ICD-10-CM | POA: Diagnosis not present

## 2020-07-12 DIAGNOSIS — Z992 Dependence on renal dialysis: Secondary | ICD-10-CM | POA: Diagnosis not present

## 2020-07-12 DIAGNOSIS — R300 Dysuria: Secondary | ICD-10-CM | POA: Diagnosis not present

## 2020-07-12 DIAGNOSIS — N2581 Secondary hyperparathyroidism of renal origin: Secondary | ICD-10-CM | POA: Diagnosis not present

## 2020-07-12 DIAGNOSIS — N186 End stage renal disease: Secondary | ICD-10-CM | POA: Diagnosis not present

## 2020-07-12 DIAGNOSIS — D509 Iron deficiency anemia, unspecified: Secondary | ICD-10-CM | POA: Diagnosis not present

## 2020-07-12 DIAGNOSIS — N390 Urinary tract infection, site not specified: Secondary | ICD-10-CM | POA: Diagnosis not present

## 2020-07-14 DIAGNOSIS — Z992 Dependence on renal dialysis: Secondary | ICD-10-CM | POA: Diagnosis not present

## 2020-07-14 DIAGNOSIS — N2581 Secondary hyperparathyroidism of renal origin: Secondary | ICD-10-CM | POA: Diagnosis not present

## 2020-07-14 DIAGNOSIS — N186 End stage renal disease: Secondary | ICD-10-CM | POA: Diagnosis not present

## 2020-07-14 DIAGNOSIS — N390 Urinary tract infection, site not specified: Secondary | ICD-10-CM | POA: Diagnosis not present

## 2020-07-14 DIAGNOSIS — D509 Iron deficiency anemia, unspecified: Secondary | ICD-10-CM | POA: Diagnosis not present

## 2020-07-17 DIAGNOSIS — N390 Urinary tract infection, site not specified: Secondary | ICD-10-CM | POA: Diagnosis not present

## 2020-07-17 DIAGNOSIS — N186 End stage renal disease: Secondary | ICD-10-CM | POA: Diagnosis not present

## 2020-07-17 DIAGNOSIS — D509 Iron deficiency anemia, unspecified: Secondary | ICD-10-CM | POA: Diagnosis not present

## 2020-07-17 DIAGNOSIS — N2581 Secondary hyperparathyroidism of renal origin: Secondary | ICD-10-CM | POA: Diagnosis not present

## 2020-07-17 DIAGNOSIS — Z992 Dependence on renal dialysis: Secondary | ICD-10-CM | POA: Diagnosis not present

## 2020-07-19 DIAGNOSIS — N186 End stage renal disease: Secondary | ICD-10-CM | POA: Diagnosis not present

## 2020-07-19 DIAGNOSIS — N390 Urinary tract infection, site not specified: Secondary | ICD-10-CM | POA: Diagnosis not present

## 2020-07-19 DIAGNOSIS — N2581 Secondary hyperparathyroidism of renal origin: Secondary | ICD-10-CM | POA: Diagnosis not present

## 2020-07-19 DIAGNOSIS — Z992 Dependence on renal dialysis: Secondary | ICD-10-CM | POA: Diagnosis not present

## 2020-07-19 DIAGNOSIS — D509 Iron deficiency anemia, unspecified: Secondary | ICD-10-CM | POA: Diagnosis not present

## 2020-07-22 DIAGNOSIS — Z992 Dependence on renal dialysis: Secondary | ICD-10-CM | POA: Diagnosis not present

## 2020-07-22 DIAGNOSIS — N186 End stage renal disease: Secondary | ICD-10-CM | POA: Diagnosis not present

## 2020-07-22 DIAGNOSIS — D509 Iron deficiency anemia, unspecified: Secondary | ICD-10-CM | POA: Diagnosis not present

## 2020-07-22 DIAGNOSIS — N390 Urinary tract infection, site not specified: Secondary | ICD-10-CM | POA: Diagnosis not present

## 2020-07-22 DIAGNOSIS — N2581 Secondary hyperparathyroidism of renal origin: Secondary | ICD-10-CM | POA: Diagnosis not present

## 2020-07-24 DIAGNOSIS — N2581 Secondary hyperparathyroidism of renal origin: Secondary | ICD-10-CM | POA: Diagnosis not present

## 2020-07-24 DIAGNOSIS — D509 Iron deficiency anemia, unspecified: Secondary | ICD-10-CM | POA: Diagnosis not present

## 2020-07-24 DIAGNOSIS — Z992 Dependence on renal dialysis: Secondary | ICD-10-CM | POA: Diagnosis not present

## 2020-07-24 DIAGNOSIS — N390 Urinary tract infection, site not specified: Secondary | ICD-10-CM | POA: Diagnosis not present

## 2020-07-24 DIAGNOSIS — N186 End stage renal disease: Secondary | ICD-10-CM | POA: Diagnosis not present

## 2020-07-26 DIAGNOSIS — D509 Iron deficiency anemia, unspecified: Secondary | ICD-10-CM | POA: Diagnosis not present

## 2020-07-26 DIAGNOSIS — Z992 Dependence on renal dialysis: Secondary | ICD-10-CM | POA: Diagnosis not present

## 2020-07-26 DIAGNOSIS — N390 Urinary tract infection, site not specified: Secondary | ICD-10-CM | POA: Diagnosis not present

## 2020-07-26 DIAGNOSIS — N2581 Secondary hyperparathyroidism of renal origin: Secondary | ICD-10-CM | POA: Diagnosis not present

## 2020-07-26 DIAGNOSIS — N186 End stage renal disease: Secondary | ICD-10-CM | POA: Diagnosis not present

## 2020-07-28 DIAGNOSIS — I120 Hypertensive chronic kidney disease with stage 5 chronic kidney disease or end stage renal disease: Secondary | ICD-10-CM | POA: Diagnosis not present

## 2020-07-28 DIAGNOSIS — Z992 Dependence on renal dialysis: Secondary | ICD-10-CM | POA: Diagnosis not present

## 2020-07-28 DIAGNOSIS — N186 End stage renal disease: Secondary | ICD-10-CM | POA: Diagnosis not present

## 2020-07-31 DIAGNOSIS — N186 End stage renal disease: Secondary | ICD-10-CM | POA: Diagnosis not present

## 2020-07-31 DIAGNOSIS — D631 Anemia in chronic kidney disease: Secondary | ICD-10-CM | POA: Diagnosis not present

## 2020-07-31 DIAGNOSIS — Z992 Dependence on renal dialysis: Secondary | ICD-10-CM | POA: Diagnosis not present

## 2020-07-31 DIAGNOSIS — Z23 Encounter for immunization: Secondary | ICD-10-CM | POA: Diagnosis not present

## 2020-07-31 DIAGNOSIS — N2581 Secondary hyperparathyroidism of renal origin: Secondary | ICD-10-CM | POA: Diagnosis not present

## 2020-08-02 DIAGNOSIS — Z23 Encounter for immunization: Secondary | ICD-10-CM | POA: Diagnosis not present

## 2020-08-02 DIAGNOSIS — N186 End stage renal disease: Secondary | ICD-10-CM | POA: Diagnosis not present

## 2020-08-02 DIAGNOSIS — Z992 Dependence on renal dialysis: Secondary | ICD-10-CM | POA: Diagnosis not present

## 2020-08-02 DIAGNOSIS — D631 Anemia in chronic kidney disease: Secondary | ICD-10-CM | POA: Diagnosis not present

## 2020-08-02 DIAGNOSIS — N2581 Secondary hyperparathyroidism of renal origin: Secondary | ICD-10-CM | POA: Diagnosis not present

## 2020-08-04 DIAGNOSIS — N2581 Secondary hyperparathyroidism of renal origin: Secondary | ICD-10-CM | POA: Diagnosis not present

## 2020-08-04 DIAGNOSIS — N186 End stage renal disease: Secondary | ICD-10-CM | POA: Diagnosis not present

## 2020-08-04 DIAGNOSIS — Z992 Dependence on renal dialysis: Secondary | ICD-10-CM | POA: Diagnosis not present

## 2020-08-04 DIAGNOSIS — Z23 Encounter for immunization: Secondary | ICD-10-CM | POA: Diagnosis not present

## 2020-08-04 DIAGNOSIS — D631 Anemia in chronic kidney disease: Secondary | ICD-10-CM | POA: Diagnosis not present

## 2020-08-07 DIAGNOSIS — D631 Anemia in chronic kidney disease: Secondary | ICD-10-CM | POA: Diagnosis not present

## 2020-08-07 DIAGNOSIS — Z992 Dependence on renal dialysis: Secondary | ICD-10-CM | POA: Diagnosis not present

## 2020-08-07 DIAGNOSIS — N186 End stage renal disease: Secondary | ICD-10-CM | POA: Diagnosis not present

## 2020-08-07 DIAGNOSIS — Z23 Encounter for immunization: Secondary | ICD-10-CM | POA: Diagnosis not present

## 2020-08-07 DIAGNOSIS — N2581 Secondary hyperparathyroidism of renal origin: Secondary | ICD-10-CM | POA: Diagnosis not present

## 2020-08-09 DIAGNOSIS — N2581 Secondary hyperparathyroidism of renal origin: Secondary | ICD-10-CM | POA: Diagnosis not present

## 2020-08-09 DIAGNOSIS — Z23 Encounter for immunization: Secondary | ICD-10-CM | POA: Diagnosis not present

## 2020-08-09 DIAGNOSIS — D631 Anemia in chronic kidney disease: Secondary | ICD-10-CM | POA: Diagnosis not present

## 2020-08-09 DIAGNOSIS — N186 End stage renal disease: Secondary | ICD-10-CM | POA: Diagnosis not present

## 2020-08-09 DIAGNOSIS — Z992 Dependence on renal dialysis: Secondary | ICD-10-CM | POA: Diagnosis not present

## 2020-08-11 DIAGNOSIS — Z23 Encounter for immunization: Secondary | ICD-10-CM | POA: Diagnosis not present

## 2020-08-11 DIAGNOSIS — N2581 Secondary hyperparathyroidism of renal origin: Secondary | ICD-10-CM | POA: Diagnosis not present

## 2020-08-11 DIAGNOSIS — N186 End stage renal disease: Secondary | ICD-10-CM | POA: Diagnosis not present

## 2020-08-11 DIAGNOSIS — D631 Anemia in chronic kidney disease: Secondary | ICD-10-CM | POA: Diagnosis not present

## 2020-08-11 DIAGNOSIS — Z992 Dependence on renal dialysis: Secondary | ICD-10-CM | POA: Diagnosis not present

## 2020-08-14 DIAGNOSIS — N2581 Secondary hyperparathyroidism of renal origin: Secondary | ICD-10-CM | POA: Diagnosis not present

## 2020-08-14 DIAGNOSIS — N186 End stage renal disease: Secondary | ICD-10-CM | POA: Diagnosis not present

## 2020-08-14 DIAGNOSIS — Z992 Dependence on renal dialysis: Secondary | ICD-10-CM | POA: Diagnosis not present

## 2020-08-14 DIAGNOSIS — D631 Anemia in chronic kidney disease: Secondary | ICD-10-CM | POA: Diagnosis not present

## 2020-08-14 DIAGNOSIS — Z23 Encounter for immunization: Secondary | ICD-10-CM | POA: Diagnosis not present

## 2020-08-16 DIAGNOSIS — Z23 Encounter for immunization: Secondary | ICD-10-CM | POA: Diagnosis not present

## 2020-08-16 DIAGNOSIS — N2581 Secondary hyperparathyroidism of renal origin: Secondary | ICD-10-CM | POA: Diagnosis not present

## 2020-08-16 DIAGNOSIS — D631 Anemia in chronic kidney disease: Secondary | ICD-10-CM | POA: Diagnosis not present

## 2020-08-16 DIAGNOSIS — N186 End stage renal disease: Secondary | ICD-10-CM | POA: Diagnosis not present

## 2020-08-16 DIAGNOSIS — Z992 Dependence on renal dialysis: Secondary | ICD-10-CM | POA: Diagnosis not present

## 2020-08-16 DIAGNOSIS — E039 Hypothyroidism, unspecified: Secondary | ICD-10-CM | POA: Diagnosis not present

## 2020-08-21 DIAGNOSIS — Z23 Encounter for immunization: Secondary | ICD-10-CM | POA: Diagnosis not present

## 2020-08-21 DIAGNOSIS — N186 End stage renal disease: Secondary | ICD-10-CM | POA: Diagnosis not present

## 2020-08-21 DIAGNOSIS — D631 Anemia in chronic kidney disease: Secondary | ICD-10-CM | POA: Diagnosis not present

## 2020-08-21 DIAGNOSIS — Z992 Dependence on renal dialysis: Secondary | ICD-10-CM | POA: Diagnosis not present

## 2020-08-21 DIAGNOSIS — N2581 Secondary hyperparathyroidism of renal origin: Secondary | ICD-10-CM | POA: Diagnosis not present

## 2020-08-23 DIAGNOSIS — N2581 Secondary hyperparathyroidism of renal origin: Secondary | ICD-10-CM | POA: Diagnosis not present

## 2020-08-23 DIAGNOSIS — Z23 Encounter for immunization: Secondary | ICD-10-CM | POA: Diagnosis not present

## 2020-08-23 DIAGNOSIS — D631 Anemia in chronic kidney disease: Secondary | ICD-10-CM | POA: Diagnosis not present

## 2020-08-23 DIAGNOSIS — Z992 Dependence on renal dialysis: Secondary | ICD-10-CM | POA: Diagnosis not present

## 2020-08-23 DIAGNOSIS — N186 End stage renal disease: Secondary | ICD-10-CM | POA: Diagnosis not present

## 2020-08-25 DIAGNOSIS — Z992 Dependence on renal dialysis: Secondary | ICD-10-CM | POA: Diagnosis not present

## 2020-08-25 DIAGNOSIS — D631 Anemia in chronic kidney disease: Secondary | ICD-10-CM | POA: Diagnosis not present

## 2020-08-25 DIAGNOSIS — N2581 Secondary hyperparathyroidism of renal origin: Secondary | ICD-10-CM | POA: Diagnosis not present

## 2020-08-25 DIAGNOSIS — N186 End stage renal disease: Secondary | ICD-10-CM | POA: Diagnosis not present

## 2020-08-25 DIAGNOSIS — Z23 Encounter for immunization: Secondary | ICD-10-CM | POA: Diagnosis not present

## 2020-08-28 DIAGNOSIS — N390 Urinary tract infection, site not specified: Secondary | ICD-10-CM | POA: Diagnosis not present

## 2020-08-28 DIAGNOSIS — N2581 Secondary hyperparathyroidism of renal origin: Secondary | ICD-10-CM | POA: Diagnosis not present

## 2020-08-28 DIAGNOSIS — N186 End stage renal disease: Secondary | ICD-10-CM | POA: Diagnosis not present

## 2020-08-28 DIAGNOSIS — E8779 Other fluid overload: Secondary | ICD-10-CM | POA: Diagnosis not present

## 2020-08-28 DIAGNOSIS — D631 Anemia in chronic kidney disease: Secondary | ICD-10-CM | POA: Diagnosis not present

## 2020-08-28 DIAGNOSIS — R52 Pain, unspecified: Secondary | ICD-10-CM | POA: Diagnosis not present

## 2020-08-28 DIAGNOSIS — I120 Hypertensive chronic kidney disease with stage 5 chronic kidney disease or end stage renal disease: Secondary | ICD-10-CM | POA: Diagnosis not present

## 2020-08-28 DIAGNOSIS — Z992 Dependence on renal dialysis: Secondary | ICD-10-CM | POA: Diagnosis not present

## 2020-08-30 DIAGNOSIS — N390 Urinary tract infection, site not specified: Secondary | ICD-10-CM | POA: Diagnosis not present

## 2020-08-30 DIAGNOSIS — Z992 Dependence on renal dialysis: Secondary | ICD-10-CM | POA: Diagnosis not present

## 2020-08-30 DIAGNOSIS — N186 End stage renal disease: Secondary | ICD-10-CM | POA: Diagnosis not present

## 2020-08-30 DIAGNOSIS — D631 Anemia in chronic kidney disease: Secondary | ICD-10-CM | POA: Diagnosis not present

## 2020-08-30 DIAGNOSIS — N2581 Secondary hyperparathyroidism of renal origin: Secondary | ICD-10-CM | POA: Diagnosis not present

## 2020-08-31 DIAGNOSIS — H6522 Chronic serous otitis media, left ear: Secondary | ICD-10-CM | POA: Diagnosis not present

## 2020-08-31 DIAGNOSIS — Q059 Spina bifida, unspecified: Secondary | ICD-10-CM | POA: Diagnosis not present

## 2020-09-01 DIAGNOSIS — N2581 Secondary hyperparathyroidism of renal origin: Secondary | ICD-10-CM | POA: Diagnosis not present

## 2020-09-01 DIAGNOSIS — N186 End stage renal disease: Secondary | ICD-10-CM | POA: Diagnosis not present

## 2020-09-01 DIAGNOSIS — D631 Anemia in chronic kidney disease: Secondary | ICD-10-CM | POA: Diagnosis not present

## 2020-09-01 DIAGNOSIS — Z992 Dependence on renal dialysis: Secondary | ICD-10-CM | POA: Diagnosis not present

## 2020-09-01 DIAGNOSIS — N390 Urinary tract infection, site not specified: Secondary | ICD-10-CM | POA: Diagnosis not present

## 2020-09-04 DIAGNOSIS — N186 End stage renal disease: Secondary | ICD-10-CM | POA: Diagnosis not present

## 2020-09-04 DIAGNOSIS — N2581 Secondary hyperparathyroidism of renal origin: Secondary | ICD-10-CM | POA: Diagnosis not present

## 2020-09-04 DIAGNOSIS — Z992 Dependence on renal dialysis: Secondary | ICD-10-CM | POA: Diagnosis not present

## 2020-09-04 DIAGNOSIS — D631 Anemia in chronic kidney disease: Secondary | ICD-10-CM | POA: Diagnosis not present

## 2020-09-04 DIAGNOSIS — N390 Urinary tract infection, site not specified: Secondary | ICD-10-CM | POA: Diagnosis not present

## 2020-09-06 DIAGNOSIS — Z992 Dependence on renal dialysis: Secondary | ICD-10-CM | POA: Diagnosis not present

## 2020-09-06 DIAGNOSIS — N2581 Secondary hyperparathyroidism of renal origin: Secondary | ICD-10-CM | POA: Diagnosis not present

## 2020-09-06 DIAGNOSIS — D631 Anemia in chronic kidney disease: Secondary | ICD-10-CM | POA: Diagnosis not present

## 2020-09-06 DIAGNOSIS — N390 Urinary tract infection, site not specified: Secondary | ICD-10-CM | POA: Diagnosis not present

## 2020-09-06 DIAGNOSIS — N186 End stage renal disease: Secondary | ICD-10-CM | POA: Diagnosis not present

## 2020-09-08 DIAGNOSIS — N390 Urinary tract infection, site not specified: Secondary | ICD-10-CM | POA: Diagnosis not present

## 2020-09-08 DIAGNOSIS — N2581 Secondary hyperparathyroidism of renal origin: Secondary | ICD-10-CM | POA: Diagnosis not present

## 2020-09-08 DIAGNOSIS — D631 Anemia in chronic kidney disease: Secondary | ICD-10-CM | POA: Diagnosis not present

## 2020-09-08 DIAGNOSIS — Z992 Dependence on renal dialysis: Secondary | ICD-10-CM | POA: Diagnosis not present

## 2020-09-08 DIAGNOSIS — N186 End stage renal disease: Secondary | ICD-10-CM | POA: Diagnosis not present

## 2020-09-11 DIAGNOSIS — N186 End stage renal disease: Secondary | ICD-10-CM | POA: Diagnosis not present

## 2020-09-11 DIAGNOSIS — Z992 Dependence on renal dialysis: Secondary | ICD-10-CM | POA: Diagnosis not present

## 2020-09-11 DIAGNOSIS — D631 Anemia in chronic kidney disease: Secondary | ICD-10-CM | POA: Diagnosis not present

## 2020-09-11 DIAGNOSIS — N2581 Secondary hyperparathyroidism of renal origin: Secondary | ICD-10-CM | POA: Diagnosis not present

## 2020-09-11 DIAGNOSIS — N390 Urinary tract infection, site not specified: Secondary | ICD-10-CM | POA: Diagnosis not present

## 2020-09-13 DIAGNOSIS — D631 Anemia in chronic kidney disease: Secondary | ICD-10-CM | POA: Diagnosis not present

## 2020-09-13 DIAGNOSIS — N2581 Secondary hyperparathyroidism of renal origin: Secondary | ICD-10-CM | POA: Diagnosis not present

## 2020-09-13 DIAGNOSIS — N390 Urinary tract infection, site not specified: Secondary | ICD-10-CM | POA: Diagnosis not present

## 2020-09-13 DIAGNOSIS — Z992 Dependence on renal dialysis: Secondary | ICD-10-CM | POA: Diagnosis not present

## 2020-09-13 DIAGNOSIS — N186 End stage renal disease: Secondary | ICD-10-CM | POA: Diagnosis not present

## 2020-09-14 DIAGNOSIS — N2581 Secondary hyperparathyroidism of renal origin: Secondary | ICD-10-CM | POA: Diagnosis not present

## 2020-09-14 DIAGNOSIS — N186 End stage renal disease: Secondary | ICD-10-CM | POA: Diagnosis not present

## 2020-09-14 DIAGNOSIS — D631 Anemia in chronic kidney disease: Secondary | ICD-10-CM | POA: Diagnosis not present

## 2020-09-14 DIAGNOSIS — Z992 Dependence on renal dialysis: Secondary | ICD-10-CM | POA: Diagnosis not present

## 2020-09-14 DIAGNOSIS — N390 Urinary tract infection, site not specified: Secondary | ICD-10-CM | POA: Diagnosis not present

## 2020-09-18 DIAGNOSIS — N186 End stage renal disease: Secondary | ICD-10-CM | POA: Diagnosis not present

## 2020-09-18 DIAGNOSIS — N2581 Secondary hyperparathyroidism of renal origin: Secondary | ICD-10-CM | POA: Diagnosis not present

## 2020-09-18 DIAGNOSIS — Z992 Dependence on renal dialysis: Secondary | ICD-10-CM | POA: Diagnosis not present

## 2020-09-18 DIAGNOSIS — D631 Anemia in chronic kidney disease: Secondary | ICD-10-CM | POA: Diagnosis not present

## 2020-09-18 DIAGNOSIS — N390 Urinary tract infection, site not specified: Secondary | ICD-10-CM | POA: Diagnosis not present

## 2020-09-20 DIAGNOSIS — N2581 Secondary hyperparathyroidism of renal origin: Secondary | ICD-10-CM | POA: Diagnosis not present

## 2020-09-20 DIAGNOSIS — N390 Urinary tract infection, site not specified: Secondary | ICD-10-CM | POA: Diagnosis not present

## 2020-09-20 DIAGNOSIS — Z992 Dependence on renal dialysis: Secondary | ICD-10-CM | POA: Diagnosis not present

## 2020-09-20 DIAGNOSIS — N186 End stage renal disease: Secondary | ICD-10-CM | POA: Diagnosis not present

## 2020-09-20 DIAGNOSIS — D631 Anemia in chronic kidney disease: Secondary | ICD-10-CM | POA: Diagnosis not present

## 2020-09-22 DIAGNOSIS — N186 End stage renal disease: Secondary | ICD-10-CM | POA: Diagnosis not present

## 2020-09-22 DIAGNOSIS — N2581 Secondary hyperparathyroidism of renal origin: Secondary | ICD-10-CM | POA: Diagnosis not present

## 2020-09-22 DIAGNOSIS — D631 Anemia in chronic kidney disease: Secondary | ICD-10-CM | POA: Diagnosis not present

## 2020-09-22 DIAGNOSIS — Z992 Dependence on renal dialysis: Secondary | ICD-10-CM | POA: Diagnosis not present

## 2020-09-22 DIAGNOSIS — N390 Urinary tract infection, site not specified: Secondary | ICD-10-CM | POA: Diagnosis not present

## 2020-09-25 DIAGNOSIS — N186 End stage renal disease: Secondary | ICD-10-CM | POA: Diagnosis not present

## 2020-09-25 DIAGNOSIS — D631 Anemia in chronic kidney disease: Secondary | ICD-10-CM | POA: Diagnosis not present

## 2020-09-25 DIAGNOSIS — I120 Hypertensive chronic kidney disease with stage 5 chronic kidney disease or end stage renal disease: Secondary | ICD-10-CM | POA: Diagnosis not present

## 2020-09-25 DIAGNOSIS — N2581 Secondary hyperparathyroidism of renal origin: Secondary | ICD-10-CM | POA: Diagnosis not present

## 2020-09-25 DIAGNOSIS — Z992 Dependence on renal dialysis: Secondary | ICD-10-CM | POA: Diagnosis not present

## 2020-09-27 DIAGNOSIS — D631 Anemia in chronic kidney disease: Secondary | ICD-10-CM | POA: Diagnosis not present

## 2020-09-27 DIAGNOSIS — N186 End stage renal disease: Secondary | ICD-10-CM | POA: Diagnosis not present

## 2020-09-27 DIAGNOSIS — Z992 Dependence on renal dialysis: Secondary | ICD-10-CM | POA: Diagnosis not present

## 2020-09-27 DIAGNOSIS — N2581 Secondary hyperparathyroidism of renal origin: Secondary | ICD-10-CM | POA: Diagnosis not present

## 2020-09-29 DIAGNOSIS — N186 End stage renal disease: Secondary | ICD-10-CM | POA: Diagnosis not present

## 2020-09-29 DIAGNOSIS — N2581 Secondary hyperparathyroidism of renal origin: Secondary | ICD-10-CM | POA: Diagnosis not present

## 2020-09-29 DIAGNOSIS — D631 Anemia in chronic kidney disease: Secondary | ICD-10-CM | POA: Diagnosis not present

## 2020-09-29 DIAGNOSIS — Z992 Dependence on renal dialysis: Secondary | ICD-10-CM | POA: Diagnosis not present

## 2020-10-02 DIAGNOSIS — Z992 Dependence on renal dialysis: Secondary | ICD-10-CM | POA: Diagnosis not present

## 2020-10-02 DIAGNOSIS — N2581 Secondary hyperparathyroidism of renal origin: Secondary | ICD-10-CM | POA: Diagnosis not present

## 2020-10-02 DIAGNOSIS — D631 Anemia in chronic kidney disease: Secondary | ICD-10-CM | POA: Diagnosis not present

## 2020-10-02 DIAGNOSIS — N186 End stage renal disease: Secondary | ICD-10-CM | POA: Diagnosis not present

## 2020-10-04 DIAGNOSIS — Z992 Dependence on renal dialysis: Secondary | ICD-10-CM | POA: Diagnosis not present

## 2020-10-04 DIAGNOSIS — D631 Anemia in chronic kidney disease: Secondary | ICD-10-CM | POA: Diagnosis not present

## 2020-10-04 DIAGNOSIS — N186 End stage renal disease: Secondary | ICD-10-CM | POA: Diagnosis not present

## 2020-10-04 DIAGNOSIS — N2581 Secondary hyperparathyroidism of renal origin: Secondary | ICD-10-CM | POA: Diagnosis not present

## 2020-10-06 DIAGNOSIS — N2581 Secondary hyperparathyroidism of renal origin: Secondary | ICD-10-CM | POA: Diagnosis not present

## 2020-10-06 DIAGNOSIS — Z992 Dependence on renal dialysis: Secondary | ICD-10-CM | POA: Diagnosis not present

## 2020-10-06 DIAGNOSIS — D631 Anemia in chronic kidney disease: Secondary | ICD-10-CM | POA: Diagnosis not present

## 2020-10-06 DIAGNOSIS — N186 End stage renal disease: Secondary | ICD-10-CM | POA: Diagnosis not present

## 2020-10-09 DIAGNOSIS — N186 End stage renal disease: Secondary | ICD-10-CM | POA: Diagnosis not present

## 2020-10-09 DIAGNOSIS — Z992 Dependence on renal dialysis: Secondary | ICD-10-CM | POA: Diagnosis not present

## 2020-10-09 DIAGNOSIS — D631 Anemia in chronic kidney disease: Secondary | ICD-10-CM | POA: Diagnosis not present

## 2020-10-09 DIAGNOSIS — N2581 Secondary hyperparathyroidism of renal origin: Secondary | ICD-10-CM | POA: Diagnosis not present

## 2020-10-10 DIAGNOSIS — N186 End stage renal disease: Secondary | ICD-10-CM | POA: Diagnosis not present

## 2020-10-10 DIAGNOSIS — T82898A Other specified complication of vascular prosthetic devices, implants and grafts, initial encounter: Secondary | ICD-10-CM | POA: Diagnosis not present

## 2020-10-10 DIAGNOSIS — Z992 Dependence on renal dialysis: Secondary | ICD-10-CM | POA: Diagnosis not present

## 2020-10-10 NOTE — Progress Notes (Deleted)
Subjective: No diagnosis found.   Consult requested by *** *** ROS:  ROS  Allergies  Allergen Reactions  . Ciprofloxacin Shortness Of Breath, Nausea And Vomiting and Other (See Comments)    HIGH FEVER and oral blisters   . Other Anaphylaxis    Revaclear dialzer  . Peanut-Containing Drug Products Anaphylaxis  . Aleve [Naproxen Sodium] Other (See Comments)    G.I.Bleed  . Ceftriaxone Other (See Comments)    Blisters in mouth   . Coconut Oil Hives  . Influenza Vaccines Nausea And Vomiting and Other (See Comments)    High fever  . Tetanus Toxoid, Adsorbed Nausea And Vomiting and Other (See Comments)    HIGH FEVER, also  . Tetanus Toxoids Nausea And Vomiting and Other (See Comments)    HIGH FEVER  . Latex Itching and Rash    Past Medical History:  Diagnosis Date  . Anemia associated with chronic renal failure   . Blood transfusion   . 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  . Depression 05/14/2015  . Dialysis care   . ESRD (end stage renal disease) on dialysis ([place])   . GERD (gastroesophageal reflux disease) 01/07/2017  . HTN (hypertension) 05/02/2011  . Spina bifida   . UTI (lower urinary tract infection)     Past Surgical History:  Procedure Laterality Date  . AV FISTULA PLACEMENT     left arm  . INSERTION OF DIALYSIS CATHETER N/A 05/08/2020   Procedure: ATTEMPTED, UNSUCCESSFUL INSERTION OF DIALYSIS CATHETER TUNNELED RIGHT INTERNAL JUGULAR;  Surgeon: Virl Cagey, MD;  Location: [facility];  Service: General;  Laterality: N/A;  . IR FLUORO GUIDE CV LINE RIGHT  05/09/2020  . IR US GUIDE VASC ACCESS RIGHT  05/09/2020    Social History   Socioeconomic History  . Marital status: Single    Spouse name: Not on file  . Number of children: Not on file  . Years of education: Not on file  . Highest education level: Not on file  Occupational History  . Not on file  Tobacco Use  . Smoking status: Former Smoker    Packs/day: 0.10    Types:  Cigarettes  . Smokeless tobacco: Never Used  . Tobacco comment: 2 cigs a day  Vaping Use  . Vaping Use: Never used  Substance and Sexual Activity  . Alcohol use: No  . Drug use: No  . Sexual activity: Never  Other Topics Concern  . Not on file  Social History Narrative  . Not on file   Social Determinants of Health   Financial Resource Strain: Not on file  Food Insecurity: Not on file  Transportation Needs: Not on file  Physical Activity: Not on file  Stress: Not on file  Social Connections: Not on file  Intimate Partner Violence: Not on file    Family History  Problem Relation Age of Onset  . Kidney cancer Other   . Hypertension Maternal Grandmother   . Arthritis Maternal Grandmother   . Breast cancer Maternal Aunt   . Colon cancer Neg Hx     Anti-infectives: Anti-infectives (From admission, onward)   None      Current Outpatient Medications  Medication Sig Dispense Refill  . acetaminophen (TYLENOL) 500 MG tablet Take 1,000 mg by mouth every 6 (six) hours as needed for mild pain or headache.    Marland Kitchen amLODipine (NORVASC) 5 MG tablet Take 5 mg by mouth at bedtime.    Lorin Picket 1 GM 210 MG(Fe)  tablet Take 2 tablets by mouth 3 (three) times daily with meals.  3  . B Complex-C-Folic Acid (DIALYVITE 811) 0.8 MG TABS Take 0.8 mg by mouth daily.    . diphenhydrAMINE (BENADRYL) 25 mg capsule Take 25 mg by mouth daily as needed for allergies.    Marland Kitchen Epoetin Alfa (EPOGEN IJ) Inject as directed See admin instructions. Every Tuesday, Thursday, Saturday    . hydrALAZINE (APRESOLINE) 50 MG tablet Take 50 mg by mouth 3 (three) times daily.    . metoCLOPramide (REGLAN) 5 MG tablet Take 1 tablet (5 mg total) by mouth 4 (four) times daily. Take one tablet 1-2 hours before dialysis 45 tablet 1  . metoprolol succinate (TOPROL-XL) 50 MG 24 hr tablet Take 50 mg by mouth daily.    . montelukast (SINGULAIR) 10 MG tablet Take 10 mg by mouth at bedtime.    . multivitamin (RENA-VIT) TABS tablet  Take 1 tablet by mouth See admin instructions. Every Tuesday, Thursday, Saturday    . omeprazole (PRILOSEC) 20 MG capsule Take 20 mg by mouth daily.   3   No current facility-administered medications for this visit.     Objective: Vital signs in last 24 hours: There were no vitals taken for this visit.  Intake/Output from previous day: No intake/output data recorded. Intake/Output this shift: @IOTHISSHIFT @   Physical Exam  Lab Results:  No results found for this or any previous visit (from the past 24 hour(s)).  BMET No results for input(s): NA, K, CL, CO2, GLUCOSE, BUN, CREATININE, CALCIUM in the last 72 hours. PT/INR No results for input(s): LABPROT, INR in the last 72 hours. ABG No results for input(s): PHART, HCO3 in the last 72 hours.  Invalid input(s): PCO2, PO2  Studies/Results: No results found.   Assessment/Plan: No problem-specific Assessment & Plan notes found for this encounter.   No orders of the defined types were placed in this encounter.    No orders of the defined types were placed in this encounter.    No follow-ups on file.    CC: ***     Irine Seal 10/10/2020 [phone]

## 2020-10-11 ENCOUNTER — Ambulatory Visit: Payer: Medicare Other | Admitting: Urology

## 2020-10-11 DIAGNOSIS — N186 End stage renal disease: Secondary | ICD-10-CM | POA: Diagnosis not present

## 2020-10-11 DIAGNOSIS — N390 Urinary tract infection, site not specified: Secondary | ICD-10-CM

## 2020-10-11 DIAGNOSIS — N2581 Secondary hyperparathyroidism of renal origin: Secondary | ICD-10-CM | POA: Diagnosis not present

## 2020-10-11 DIAGNOSIS — D631 Anemia in chronic kidney disease: Secondary | ICD-10-CM | POA: Diagnosis not present

## 2020-10-11 DIAGNOSIS — Z992 Dependence on renal dialysis: Secondary | ICD-10-CM | POA: Diagnosis not present

## 2020-10-13 DIAGNOSIS — Z992 Dependence on renal dialysis: Secondary | ICD-10-CM | POA: Diagnosis not present

## 2020-10-13 DIAGNOSIS — N186 End stage renal disease: Secondary | ICD-10-CM | POA: Diagnosis not present

## 2020-10-13 DIAGNOSIS — D631 Anemia in chronic kidney disease: Secondary | ICD-10-CM | POA: Diagnosis not present

## 2020-10-13 DIAGNOSIS — N2581 Secondary hyperparathyroidism of renal origin: Secondary | ICD-10-CM | POA: Diagnosis not present

## 2020-10-16 DIAGNOSIS — Z992 Dependence on renal dialysis: Secondary | ICD-10-CM | POA: Diagnosis not present

## 2020-10-16 DIAGNOSIS — N2581 Secondary hyperparathyroidism of renal origin: Secondary | ICD-10-CM | POA: Diagnosis not present

## 2020-10-16 DIAGNOSIS — N186 End stage renal disease: Secondary | ICD-10-CM | POA: Diagnosis not present

## 2020-10-16 DIAGNOSIS — D631 Anemia in chronic kidney disease: Secondary | ICD-10-CM | POA: Diagnosis not present

## 2020-10-18 DIAGNOSIS — Z992 Dependence on renal dialysis: Secondary | ICD-10-CM | POA: Diagnosis not present

## 2020-10-18 DIAGNOSIS — N2581 Secondary hyperparathyroidism of renal origin: Secondary | ICD-10-CM | POA: Diagnosis not present

## 2020-10-18 DIAGNOSIS — D631 Anemia in chronic kidney disease: Secondary | ICD-10-CM | POA: Diagnosis not present

## 2020-10-18 DIAGNOSIS — N186 End stage renal disease: Secondary | ICD-10-CM | POA: Diagnosis not present

## 2020-10-23 DIAGNOSIS — D631 Anemia in chronic kidney disease: Secondary | ICD-10-CM | POA: Diagnosis not present

## 2020-10-23 DIAGNOSIS — N2581 Secondary hyperparathyroidism of renal origin: Secondary | ICD-10-CM | POA: Diagnosis not present

## 2020-10-23 DIAGNOSIS — N186 End stage renal disease: Secondary | ICD-10-CM | POA: Diagnosis not present

## 2020-10-23 DIAGNOSIS — Z992 Dependence on renal dialysis: Secondary | ICD-10-CM | POA: Diagnosis not present

## 2020-10-25 DIAGNOSIS — N186 End stage renal disease: Secondary | ICD-10-CM | POA: Diagnosis not present

## 2020-10-25 DIAGNOSIS — D631 Anemia in chronic kidney disease: Secondary | ICD-10-CM | POA: Diagnosis not present

## 2020-10-25 DIAGNOSIS — Z992 Dependence on renal dialysis: Secondary | ICD-10-CM | POA: Diagnosis not present

## 2020-10-25 DIAGNOSIS — N2581 Secondary hyperparathyroidism of renal origin: Secondary | ICD-10-CM | POA: Diagnosis not present

## 2020-10-26 DIAGNOSIS — I120 Hypertensive chronic kidney disease with stage 5 chronic kidney disease or end stage renal disease: Secondary | ICD-10-CM | POA: Diagnosis not present

## 2020-10-26 DIAGNOSIS — N186 End stage renal disease: Secondary | ICD-10-CM | POA: Diagnosis not present

## 2020-10-26 DIAGNOSIS — Z992 Dependence on renal dialysis: Secondary | ICD-10-CM | POA: Diagnosis not present

## 2020-10-27 DIAGNOSIS — M545 Low back pain, unspecified: Secondary | ICD-10-CM | POA: Diagnosis not present

## 2020-10-27 DIAGNOSIS — Z992 Dependence on renal dialysis: Secondary | ICD-10-CM | POA: Diagnosis not present

## 2020-10-27 DIAGNOSIS — D631 Anemia in chronic kidney disease: Secondary | ICD-10-CM | POA: Diagnosis not present

## 2020-10-27 DIAGNOSIS — N186 End stage renal disease: Secondary | ICD-10-CM | POA: Diagnosis not present

## 2020-10-27 DIAGNOSIS — N2581 Secondary hyperparathyroidism of renal origin: Secondary | ICD-10-CM | POA: Diagnosis not present

## 2020-10-27 DIAGNOSIS — R300 Dysuria: Secondary | ICD-10-CM | POA: Diagnosis not present

## 2020-10-29 DIAGNOSIS — Z0000 Encounter for general adult medical examination without abnormal findings: Secondary | ICD-10-CM | POA: Diagnosis not present

## 2020-10-29 DIAGNOSIS — I129 Hypertensive chronic kidney disease with stage 1 through stage 4 chronic kidney disease, or unspecified chronic kidney disease: Secondary | ICD-10-CM | POA: Diagnosis not present

## 2020-10-29 DIAGNOSIS — Q059 Spina bifida, unspecified: Secondary | ICD-10-CM | POA: Diagnosis not present

## 2020-10-29 DIAGNOSIS — N189 Chronic kidney disease, unspecified: Secondary | ICD-10-CM | POA: Diagnosis not present

## 2020-10-30 DIAGNOSIS — R300 Dysuria: Secondary | ICD-10-CM | POA: Diagnosis not present

## 2020-10-30 DIAGNOSIS — M545 Low back pain, unspecified: Secondary | ICD-10-CM | POA: Diagnosis not present

## 2020-10-30 DIAGNOSIS — N2581 Secondary hyperparathyroidism of renal origin: Secondary | ICD-10-CM | POA: Diagnosis not present

## 2020-10-30 DIAGNOSIS — N186 End stage renal disease: Secondary | ICD-10-CM | POA: Diagnosis not present

## 2020-10-30 DIAGNOSIS — Z992 Dependence on renal dialysis: Secondary | ICD-10-CM | POA: Diagnosis not present

## 2020-11-03 DIAGNOSIS — R300 Dysuria: Secondary | ICD-10-CM | POA: Diagnosis not present

## 2020-11-03 DIAGNOSIS — N186 End stage renal disease: Secondary | ICD-10-CM | POA: Diagnosis not present

## 2020-11-03 DIAGNOSIS — Z992 Dependence on renal dialysis: Secondary | ICD-10-CM | POA: Diagnosis not present

## 2020-11-03 DIAGNOSIS — M545 Low back pain, unspecified: Secondary | ICD-10-CM | POA: Diagnosis not present

## 2020-11-03 DIAGNOSIS — N2581 Secondary hyperparathyroidism of renal origin: Secondary | ICD-10-CM | POA: Diagnosis not present

## 2020-11-06 DIAGNOSIS — R300 Dysuria: Secondary | ICD-10-CM | POA: Diagnosis not present

## 2020-11-06 DIAGNOSIS — M545 Low back pain, unspecified: Secondary | ICD-10-CM | POA: Diagnosis not present

## 2020-11-06 DIAGNOSIS — N2581 Secondary hyperparathyroidism of renal origin: Secondary | ICD-10-CM | POA: Diagnosis not present

## 2020-11-06 DIAGNOSIS — N186 End stage renal disease: Secondary | ICD-10-CM | POA: Diagnosis not present

## 2020-11-06 DIAGNOSIS — Z992 Dependence on renal dialysis: Secondary | ICD-10-CM | POA: Diagnosis not present

## 2020-11-08 DIAGNOSIS — R300 Dysuria: Secondary | ICD-10-CM | POA: Diagnosis not present

## 2020-11-08 DIAGNOSIS — Z992 Dependence on renal dialysis: Secondary | ICD-10-CM | POA: Diagnosis not present

## 2020-11-08 DIAGNOSIS — N186 End stage renal disease: Secondary | ICD-10-CM | POA: Diagnosis not present

## 2020-11-08 DIAGNOSIS — N2581 Secondary hyperparathyroidism of renal origin: Secondary | ICD-10-CM | POA: Diagnosis not present

## 2020-11-08 DIAGNOSIS — M545 Low back pain, unspecified: Secondary | ICD-10-CM | POA: Diagnosis not present

## 2020-11-10 DIAGNOSIS — N2581 Secondary hyperparathyroidism of renal origin: Secondary | ICD-10-CM | POA: Diagnosis not present

## 2020-11-10 DIAGNOSIS — N186 End stage renal disease: Secondary | ICD-10-CM | POA: Diagnosis not present

## 2020-11-10 DIAGNOSIS — Z992 Dependence on renal dialysis: Secondary | ICD-10-CM | POA: Diagnosis not present

## 2020-11-10 DIAGNOSIS — M545 Low back pain, unspecified: Secondary | ICD-10-CM | POA: Diagnosis not present

## 2020-11-10 DIAGNOSIS — R300 Dysuria: Secondary | ICD-10-CM | POA: Diagnosis not present

## 2020-11-13 DIAGNOSIS — N2581 Secondary hyperparathyroidism of renal origin: Secondary | ICD-10-CM | POA: Diagnosis not present

## 2020-11-13 DIAGNOSIS — R300 Dysuria: Secondary | ICD-10-CM | POA: Diagnosis not present

## 2020-11-13 DIAGNOSIS — N186 End stage renal disease: Secondary | ICD-10-CM | POA: Diagnosis not present

## 2020-11-13 DIAGNOSIS — M545 Low back pain, unspecified: Secondary | ICD-10-CM | POA: Diagnosis not present

## 2020-11-13 DIAGNOSIS — Z992 Dependence on renal dialysis: Secondary | ICD-10-CM | POA: Diagnosis not present

## 2020-11-15 DIAGNOSIS — N2581 Secondary hyperparathyroidism of renal origin: Secondary | ICD-10-CM | POA: Diagnosis not present

## 2020-11-15 DIAGNOSIS — E039 Hypothyroidism, unspecified: Secondary | ICD-10-CM | POA: Diagnosis not present

## 2020-11-15 DIAGNOSIS — Z992 Dependence on renal dialysis: Secondary | ICD-10-CM | POA: Diagnosis not present

## 2020-11-15 DIAGNOSIS — R300 Dysuria: Secondary | ICD-10-CM | POA: Diagnosis not present

## 2020-11-15 DIAGNOSIS — M545 Low back pain, unspecified: Secondary | ICD-10-CM | POA: Diagnosis not present

## 2020-11-15 DIAGNOSIS — N186 End stage renal disease: Secondary | ICD-10-CM | POA: Diagnosis not present

## 2020-11-17 DIAGNOSIS — N186 End stage renal disease: Secondary | ICD-10-CM | POA: Diagnosis not present

## 2020-11-17 DIAGNOSIS — M545 Low back pain, unspecified: Secondary | ICD-10-CM | POA: Diagnosis not present

## 2020-11-17 DIAGNOSIS — Z992 Dependence on renal dialysis: Secondary | ICD-10-CM | POA: Diagnosis not present

## 2020-11-17 DIAGNOSIS — R300 Dysuria: Secondary | ICD-10-CM | POA: Diagnosis not present

## 2020-11-17 DIAGNOSIS — N2581 Secondary hyperparathyroidism of renal origin: Secondary | ICD-10-CM | POA: Diagnosis not present

## 2020-11-21 DIAGNOSIS — N186 End stage renal disease: Secondary | ICD-10-CM | POA: Diagnosis not present

## 2020-11-21 DIAGNOSIS — R300 Dysuria: Secondary | ICD-10-CM | POA: Diagnosis not present

## 2020-11-21 DIAGNOSIS — M545 Low back pain, unspecified: Secondary | ICD-10-CM | POA: Diagnosis not present

## 2020-11-21 DIAGNOSIS — N2581 Secondary hyperparathyroidism of renal origin: Secondary | ICD-10-CM | POA: Diagnosis not present

## 2020-11-21 DIAGNOSIS — Z992 Dependence on renal dialysis: Secondary | ICD-10-CM | POA: Diagnosis not present

## 2020-11-22 DIAGNOSIS — Z992 Dependence on renal dialysis: Secondary | ICD-10-CM | POA: Diagnosis not present

## 2020-11-22 DIAGNOSIS — R300 Dysuria: Secondary | ICD-10-CM | POA: Diagnosis not present

## 2020-11-22 DIAGNOSIS — N2581 Secondary hyperparathyroidism of renal origin: Secondary | ICD-10-CM | POA: Diagnosis not present

## 2020-11-22 DIAGNOSIS — M545 Low back pain, unspecified: Secondary | ICD-10-CM | POA: Diagnosis not present

## 2020-11-22 DIAGNOSIS — N186 End stage renal disease: Secondary | ICD-10-CM | POA: Diagnosis not present

## 2020-11-24 DIAGNOSIS — M545 Low back pain, unspecified: Secondary | ICD-10-CM | POA: Diagnosis not present

## 2020-11-24 DIAGNOSIS — N186 End stage renal disease: Secondary | ICD-10-CM | POA: Diagnosis not present

## 2020-11-24 DIAGNOSIS — Z992 Dependence on renal dialysis: Secondary | ICD-10-CM | POA: Diagnosis not present

## 2020-11-24 DIAGNOSIS — R300 Dysuria: Secondary | ICD-10-CM | POA: Diagnosis not present

## 2020-11-24 DIAGNOSIS — N2581 Secondary hyperparathyroidism of renal origin: Secondary | ICD-10-CM | POA: Diagnosis not present

## 2020-11-25 DIAGNOSIS — N186 End stage renal disease: Secondary | ICD-10-CM | POA: Diagnosis not present

## 2020-11-25 DIAGNOSIS — Z992 Dependence on renal dialysis: Secondary | ICD-10-CM | POA: Diagnosis not present

## 2020-11-25 DIAGNOSIS — I120 Hypertensive chronic kidney disease with stage 5 chronic kidney disease or end stage renal disease: Secondary | ICD-10-CM | POA: Diagnosis not present

## 2020-11-27 DIAGNOSIS — Z992 Dependence on renal dialysis: Secondary | ICD-10-CM | POA: Diagnosis not present

## 2020-11-27 DIAGNOSIS — N2581 Secondary hyperparathyroidism of renal origin: Secondary | ICD-10-CM | POA: Diagnosis not present

## 2020-11-27 DIAGNOSIS — N186 End stage renal disease: Secondary | ICD-10-CM | POA: Diagnosis not present

## 2020-11-27 DIAGNOSIS — D631 Anemia in chronic kidney disease: Secondary | ICD-10-CM | POA: Diagnosis not present

## 2020-11-28 DIAGNOSIS — Z20822 Contact with and (suspected) exposure to covid-19: Secondary | ICD-10-CM | POA: Diagnosis not present

## 2020-11-29 DIAGNOSIS — D631 Anemia in chronic kidney disease: Secondary | ICD-10-CM | POA: Diagnosis not present

## 2020-11-29 DIAGNOSIS — N186 End stage renal disease: Secondary | ICD-10-CM | POA: Diagnosis not present

## 2020-11-29 DIAGNOSIS — N2581 Secondary hyperparathyroidism of renal origin: Secondary | ICD-10-CM | POA: Diagnosis not present

## 2020-11-29 DIAGNOSIS — Z992 Dependence on renal dialysis: Secondary | ICD-10-CM | POA: Diagnosis not present

## 2020-11-29 DIAGNOSIS — Z20822 Contact with and (suspected) exposure to covid-19: Secondary | ICD-10-CM | POA: Diagnosis not present

## 2020-12-01 DIAGNOSIS — Z992 Dependence on renal dialysis: Secondary | ICD-10-CM | POA: Diagnosis not present

## 2020-12-01 DIAGNOSIS — D631 Anemia in chronic kidney disease: Secondary | ICD-10-CM | POA: Diagnosis not present

## 2020-12-01 DIAGNOSIS — N2581 Secondary hyperparathyroidism of renal origin: Secondary | ICD-10-CM | POA: Diagnosis not present

## 2020-12-01 DIAGNOSIS — N186 End stage renal disease: Secondary | ICD-10-CM | POA: Diagnosis not present

## 2020-12-04 DIAGNOSIS — N2581 Secondary hyperparathyroidism of renal origin: Secondary | ICD-10-CM | POA: Diagnosis not present

## 2020-12-04 DIAGNOSIS — Z992 Dependence on renal dialysis: Secondary | ICD-10-CM | POA: Diagnosis not present

## 2020-12-04 DIAGNOSIS — D631 Anemia in chronic kidney disease: Secondary | ICD-10-CM | POA: Diagnosis not present

## 2020-12-04 DIAGNOSIS — N186 End stage renal disease: Secondary | ICD-10-CM | POA: Diagnosis not present

## 2020-12-08 DIAGNOSIS — D631 Anemia in chronic kidney disease: Secondary | ICD-10-CM | POA: Diagnosis not present

## 2020-12-08 DIAGNOSIS — N2581 Secondary hyperparathyroidism of renal origin: Secondary | ICD-10-CM | POA: Diagnosis not present

## 2020-12-08 DIAGNOSIS — N186 End stage renal disease: Secondary | ICD-10-CM | POA: Diagnosis not present

## 2020-12-08 DIAGNOSIS — Z992 Dependence on renal dialysis: Secondary | ICD-10-CM | POA: Diagnosis not present

## 2020-12-11 DIAGNOSIS — D631 Anemia in chronic kidney disease: Secondary | ICD-10-CM | POA: Diagnosis not present

## 2020-12-11 DIAGNOSIS — N2581 Secondary hyperparathyroidism of renal origin: Secondary | ICD-10-CM | POA: Diagnosis not present

## 2020-12-11 DIAGNOSIS — N186 End stage renal disease: Secondary | ICD-10-CM | POA: Diagnosis not present

## 2020-12-11 DIAGNOSIS — Z992 Dependence on renal dialysis: Secondary | ICD-10-CM | POA: Diagnosis not present

## 2020-12-13 DIAGNOSIS — N186 End stage renal disease: Secondary | ICD-10-CM | POA: Diagnosis not present

## 2020-12-13 DIAGNOSIS — N2581 Secondary hyperparathyroidism of renal origin: Secondary | ICD-10-CM | POA: Diagnosis not present

## 2020-12-13 DIAGNOSIS — Z992 Dependence on renal dialysis: Secondary | ICD-10-CM | POA: Diagnosis not present

## 2020-12-13 DIAGNOSIS — D631 Anemia in chronic kidney disease: Secondary | ICD-10-CM | POA: Diagnosis not present

## 2020-12-18 DIAGNOSIS — N186 End stage renal disease: Secondary | ICD-10-CM | POA: Diagnosis not present

## 2020-12-18 DIAGNOSIS — N2581 Secondary hyperparathyroidism of renal origin: Secondary | ICD-10-CM | POA: Diagnosis not present

## 2020-12-18 DIAGNOSIS — Z992 Dependence on renal dialysis: Secondary | ICD-10-CM | POA: Diagnosis not present

## 2020-12-18 DIAGNOSIS — D631 Anemia in chronic kidney disease: Secondary | ICD-10-CM | POA: Diagnosis not present

## 2020-12-20 DIAGNOSIS — N2581 Secondary hyperparathyroidism of renal origin: Secondary | ICD-10-CM | POA: Diagnosis not present

## 2020-12-20 DIAGNOSIS — D631 Anemia in chronic kidney disease: Secondary | ICD-10-CM | POA: Diagnosis not present

## 2020-12-20 DIAGNOSIS — Z992 Dependence on renal dialysis: Secondary | ICD-10-CM | POA: Diagnosis not present

## 2020-12-20 DIAGNOSIS — N186 End stage renal disease: Secondary | ICD-10-CM | POA: Diagnosis not present

## 2020-12-22 DIAGNOSIS — N186 End stage renal disease: Secondary | ICD-10-CM | POA: Diagnosis not present

## 2020-12-22 DIAGNOSIS — D631 Anemia in chronic kidney disease: Secondary | ICD-10-CM | POA: Diagnosis not present

## 2020-12-22 DIAGNOSIS — N2581 Secondary hyperparathyroidism of renal origin: Secondary | ICD-10-CM | POA: Diagnosis not present

## 2020-12-22 DIAGNOSIS — Z992 Dependence on renal dialysis: Secondary | ICD-10-CM | POA: Diagnosis not present

## 2020-12-25 DIAGNOSIS — Z992 Dependence on renal dialysis: Secondary | ICD-10-CM | POA: Diagnosis not present

## 2020-12-25 DIAGNOSIS — N2581 Secondary hyperparathyroidism of renal origin: Secondary | ICD-10-CM | POA: Diagnosis not present

## 2020-12-25 DIAGNOSIS — D631 Anemia in chronic kidney disease: Secondary | ICD-10-CM | POA: Diagnosis not present

## 2020-12-25 DIAGNOSIS — N186 End stage renal disease: Secondary | ICD-10-CM | POA: Diagnosis not present

## 2020-12-26 DIAGNOSIS — Z992 Dependence on renal dialysis: Secondary | ICD-10-CM | POA: Diagnosis not present

## 2020-12-26 DIAGNOSIS — I120 Hypertensive chronic kidney disease with stage 5 chronic kidney disease or end stage renal disease: Secondary | ICD-10-CM | POA: Diagnosis not present

## 2020-12-26 DIAGNOSIS — N186 End stage renal disease: Secondary | ICD-10-CM | POA: Diagnosis not present

## 2020-12-27 DIAGNOSIS — N2581 Secondary hyperparathyroidism of renal origin: Secondary | ICD-10-CM | POA: Diagnosis not present

## 2020-12-27 DIAGNOSIS — D631 Anemia in chronic kidney disease: Secondary | ICD-10-CM | POA: Diagnosis not present

## 2020-12-27 DIAGNOSIS — Z992 Dependence on renal dialysis: Secondary | ICD-10-CM | POA: Diagnosis not present

## 2020-12-27 DIAGNOSIS — N186 End stage renal disease: Secondary | ICD-10-CM | POA: Diagnosis not present

## 2020-12-29 DIAGNOSIS — N2581 Secondary hyperparathyroidism of renal origin: Secondary | ICD-10-CM | POA: Diagnosis not present

## 2020-12-29 DIAGNOSIS — Z992 Dependence on renal dialysis: Secondary | ICD-10-CM | POA: Diagnosis not present

## 2020-12-29 DIAGNOSIS — D631 Anemia in chronic kidney disease: Secondary | ICD-10-CM | POA: Diagnosis not present

## 2020-12-29 DIAGNOSIS — N186 End stage renal disease: Secondary | ICD-10-CM | POA: Diagnosis not present

## 2021-01-01 DIAGNOSIS — N2581 Secondary hyperparathyroidism of renal origin: Secondary | ICD-10-CM | POA: Diagnosis not present

## 2021-01-01 DIAGNOSIS — D631 Anemia in chronic kidney disease: Secondary | ICD-10-CM | POA: Diagnosis not present

## 2021-01-01 DIAGNOSIS — Z992 Dependence on renal dialysis: Secondary | ICD-10-CM | POA: Diagnosis not present

## 2021-01-01 DIAGNOSIS — N186 End stage renal disease: Secondary | ICD-10-CM | POA: Diagnosis not present

## 2021-01-03 DIAGNOSIS — Z992 Dependence on renal dialysis: Secondary | ICD-10-CM | POA: Diagnosis not present

## 2021-01-03 DIAGNOSIS — N2581 Secondary hyperparathyroidism of renal origin: Secondary | ICD-10-CM | POA: Diagnosis not present

## 2021-01-03 DIAGNOSIS — N186 End stage renal disease: Secondary | ICD-10-CM | POA: Diagnosis not present

## 2021-01-03 DIAGNOSIS — D631 Anemia in chronic kidney disease: Secondary | ICD-10-CM | POA: Diagnosis not present

## 2021-01-05 DIAGNOSIS — N2581 Secondary hyperparathyroidism of renal origin: Secondary | ICD-10-CM | POA: Diagnosis not present

## 2021-01-05 DIAGNOSIS — D631 Anemia in chronic kidney disease: Secondary | ICD-10-CM | POA: Diagnosis not present

## 2021-01-05 DIAGNOSIS — N186 End stage renal disease: Secondary | ICD-10-CM | POA: Diagnosis not present

## 2021-01-05 DIAGNOSIS — Z992 Dependence on renal dialysis: Secondary | ICD-10-CM | POA: Diagnosis not present

## 2021-01-08 DIAGNOSIS — Z992 Dependence on renal dialysis: Secondary | ICD-10-CM | POA: Diagnosis not present

## 2021-01-08 DIAGNOSIS — N186 End stage renal disease: Secondary | ICD-10-CM | POA: Diagnosis not present

## 2021-01-08 DIAGNOSIS — D631 Anemia in chronic kidney disease: Secondary | ICD-10-CM | POA: Diagnosis not present

## 2021-01-08 DIAGNOSIS — N2581 Secondary hyperparathyroidism of renal origin: Secondary | ICD-10-CM | POA: Diagnosis not present

## 2021-01-11 DIAGNOSIS — D631 Anemia in chronic kidney disease: Secondary | ICD-10-CM | POA: Diagnosis not present

## 2021-01-11 DIAGNOSIS — N2581 Secondary hyperparathyroidism of renal origin: Secondary | ICD-10-CM | POA: Diagnosis not present

## 2021-01-11 DIAGNOSIS — N186 End stage renal disease: Secondary | ICD-10-CM | POA: Diagnosis not present

## 2021-01-11 DIAGNOSIS — Z992 Dependence on renal dialysis: Secondary | ICD-10-CM | POA: Diagnosis not present

## 2021-01-12 DIAGNOSIS — N186 End stage renal disease: Secondary | ICD-10-CM | POA: Diagnosis not present

## 2021-01-12 DIAGNOSIS — D631 Anemia in chronic kidney disease: Secondary | ICD-10-CM | POA: Diagnosis not present

## 2021-01-12 DIAGNOSIS — N2581 Secondary hyperparathyroidism of renal origin: Secondary | ICD-10-CM | POA: Diagnosis not present

## 2021-01-12 DIAGNOSIS — Z992 Dependence on renal dialysis: Secondary | ICD-10-CM | POA: Diagnosis not present

## 2021-01-15 DIAGNOSIS — Z992 Dependence on renal dialysis: Secondary | ICD-10-CM | POA: Diagnosis not present

## 2021-01-15 DIAGNOSIS — D631 Anemia in chronic kidney disease: Secondary | ICD-10-CM | POA: Diagnosis not present

## 2021-01-15 DIAGNOSIS — N2581 Secondary hyperparathyroidism of renal origin: Secondary | ICD-10-CM | POA: Diagnosis not present

## 2021-01-15 DIAGNOSIS — N186 End stage renal disease: Secondary | ICD-10-CM | POA: Diagnosis not present

## 2021-01-17 DIAGNOSIS — N186 End stage renal disease: Secondary | ICD-10-CM | POA: Diagnosis not present

## 2021-01-17 DIAGNOSIS — D631 Anemia in chronic kidney disease: Secondary | ICD-10-CM | POA: Diagnosis not present

## 2021-01-17 DIAGNOSIS — Z992 Dependence on renal dialysis: Secondary | ICD-10-CM | POA: Diagnosis not present

## 2021-01-17 DIAGNOSIS — N2581 Secondary hyperparathyroidism of renal origin: Secondary | ICD-10-CM | POA: Diagnosis not present

## 2021-01-19 DIAGNOSIS — N186 End stage renal disease: Secondary | ICD-10-CM | POA: Diagnosis not present

## 2021-01-19 DIAGNOSIS — Z992 Dependence on renal dialysis: Secondary | ICD-10-CM | POA: Diagnosis not present

## 2021-01-19 DIAGNOSIS — N2581 Secondary hyperparathyroidism of renal origin: Secondary | ICD-10-CM | POA: Diagnosis not present

## 2021-01-19 DIAGNOSIS — D631 Anemia in chronic kidney disease: Secondary | ICD-10-CM | POA: Diagnosis not present

## 2021-01-22 DIAGNOSIS — Z992 Dependence on renal dialysis: Secondary | ICD-10-CM | POA: Diagnosis not present

## 2021-01-22 DIAGNOSIS — D631 Anemia in chronic kidney disease: Secondary | ICD-10-CM | POA: Diagnosis not present

## 2021-01-22 DIAGNOSIS — N186 End stage renal disease: Secondary | ICD-10-CM | POA: Diagnosis not present

## 2021-01-22 DIAGNOSIS — N2581 Secondary hyperparathyroidism of renal origin: Secondary | ICD-10-CM | POA: Diagnosis not present

## 2021-01-24 DIAGNOSIS — N2581 Secondary hyperparathyroidism of renal origin: Secondary | ICD-10-CM | POA: Diagnosis not present

## 2021-01-24 DIAGNOSIS — N186 End stage renal disease: Secondary | ICD-10-CM | POA: Diagnosis not present

## 2021-01-24 DIAGNOSIS — Z992 Dependence on renal dialysis: Secondary | ICD-10-CM | POA: Diagnosis not present

## 2021-01-24 DIAGNOSIS — D631 Anemia in chronic kidney disease: Secondary | ICD-10-CM | POA: Diagnosis not present

## 2021-01-25 DIAGNOSIS — N186 End stage renal disease: Secondary | ICD-10-CM | POA: Diagnosis not present

## 2021-01-25 DIAGNOSIS — Z992 Dependence on renal dialysis: Secondary | ICD-10-CM | POA: Diagnosis not present

## 2021-01-25 DIAGNOSIS — I120 Hypertensive chronic kidney disease with stage 5 chronic kidney disease or end stage renal disease: Secondary | ICD-10-CM | POA: Diagnosis not present

## 2021-01-26 DIAGNOSIS — D631 Anemia in chronic kidney disease: Secondary | ICD-10-CM | POA: Diagnosis not present

## 2021-01-26 DIAGNOSIS — N2581 Secondary hyperparathyroidism of renal origin: Secondary | ICD-10-CM | POA: Diagnosis not present

## 2021-01-26 DIAGNOSIS — Z992 Dependence on renal dialysis: Secondary | ICD-10-CM | POA: Diagnosis not present

## 2021-01-26 DIAGNOSIS — N186 End stage renal disease: Secondary | ICD-10-CM | POA: Diagnosis not present

## 2021-01-29 DIAGNOSIS — N186 End stage renal disease: Secondary | ICD-10-CM | POA: Diagnosis not present

## 2021-01-29 DIAGNOSIS — Z992 Dependence on renal dialysis: Secondary | ICD-10-CM | POA: Diagnosis not present

## 2021-01-29 DIAGNOSIS — D631 Anemia in chronic kidney disease: Secondary | ICD-10-CM | POA: Diagnosis not present

## 2021-01-29 DIAGNOSIS — N2581 Secondary hyperparathyroidism of renal origin: Secondary | ICD-10-CM | POA: Diagnosis not present

## 2021-02-02 DIAGNOSIS — D631 Anemia in chronic kidney disease: Secondary | ICD-10-CM | POA: Diagnosis not present

## 2021-02-02 DIAGNOSIS — N2581 Secondary hyperparathyroidism of renal origin: Secondary | ICD-10-CM | POA: Diagnosis not present

## 2021-02-02 DIAGNOSIS — N186 End stage renal disease: Secondary | ICD-10-CM | POA: Diagnosis not present

## 2021-02-02 DIAGNOSIS — Z992 Dependence on renal dialysis: Secondary | ICD-10-CM | POA: Diagnosis not present

## 2021-02-05 DIAGNOSIS — N2581 Secondary hyperparathyroidism of renal origin: Secondary | ICD-10-CM | POA: Diagnosis not present

## 2021-02-05 DIAGNOSIS — Z992 Dependence on renal dialysis: Secondary | ICD-10-CM | POA: Diagnosis not present

## 2021-02-05 DIAGNOSIS — D631 Anemia in chronic kidney disease: Secondary | ICD-10-CM | POA: Diagnosis not present

## 2021-02-05 DIAGNOSIS — N186 End stage renal disease: Secondary | ICD-10-CM | POA: Diagnosis not present

## 2021-02-07 DIAGNOSIS — D631 Anemia in chronic kidney disease: Secondary | ICD-10-CM | POA: Diagnosis not present

## 2021-02-07 DIAGNOSIS — N186 End stage renal disease: Secondary | ICD-10-CM | POA: Diagnosis not present

## 2021-02-07 DIAGNOSIS — N2581 Secondary hyperparathyroidism of renal origin: Secondary | ICD-10-CM | POA: Diagnosis not present

## 2021-02-07 DIAGNOSIS — Z992 Dependence on renal dialysis: Secondary | ICD-10-CM | POA: Diagnosis not present

## 2021-02-11 DIAGNOSIS — D631 Anemia in chronic kidney disease: Secondary | ICD-10-CM | POA: Diagnosis not present

## 2021-02-11 DIAGNOSIS — N2581 Secondary hyperparathyroidism of renal origin: Secondary | ICD-10-CM | POA: Diagnosis not present

## 2021-02-11 DIAGNOSIS — Z992 Dependence on renal dialysis: Secondary | ICD-10-CM | POA: Diagnosis not present

## 2021-02-11 DIAGNOSIS — N186 End stage renal disease: Secondary | ICD-10-CM | POA: Diagnosis not present

## 2021-02-12 DIAGNOSIS — D631 Anemia in chronic kidney disease: Secondary | ICD-10-CM | POA: Diagnosis not present

## 2021-02-12 DIAGNOSIS — N2581 Secondary hyperparathyroidism of renal origin: Secondary | ICD-10-CM | POA: Diagnosis not present

## 2021-02-12 DIAGNOSIS — N186 End stage renal disease: Secondary | ICD-10-CM | POA: Diagnosis not present

## 2021-02-12 DIAGNOSIS — Z992 Dependence on renal dialysis: Secondary | ICD-10-CM | POA: Diagnosis not present

## 2021-02-16 DIAGNOSIS — D631 Anemia in chronic kidney disease: Secondary | ICD-10-CM | POA: Diagnosis not present

## 2021-02-16 DIAGNOSIS — N2581 Secondary hyperparathyroidism of renal origin: Secondary | ICD-10-CM | POA: Diagnosis not present

## 2021-02-16 DIAGNOSIS — Z992 Dependence on renal dialysis: Secondary | ICD-10-CM | POA: Diagnosis not present

## 2021-02-16 DIAGNOSIS — N186 End stage renal disease: Secondary | ICD-10-CM | POA: Diagnosis not present

## 2021-02-19 DIAGNOSIS — N186 End stage renal disease: Secondary | ICD-10-CM | POA: Diagnosis not present

## 2021-02-19 DIAGNOSIS — E039 Hypothyroidism, unspecified: Secondary | ICD-10-CM | POA: Diagnosis not present

## 2021-02-19 DIAGNOSIS — Z992 Dependence on renal dialysis: Secondary | ICD-10-CM | POA: Diagnosis not present

## 2021-02-19 DIAGNOSIS — N2581 Secondary hyperparathyroidism of renal origin: Secondary | ICD-10-CM | POA: Diagnosis not present

## 2021-02-19 DIAGNOSIS — D631 Anemia in chronic kidney disease: Secondary | ICD-10-CM | POA: Diagnosis not present

## 2021-02-21 DIAGNOSIS — N186 End stage renal disease: Secondary | ICD-10-CM | POA: Diagnosis not present

## 2021-02-21 DIAGNOSIS — Z992 Dependence on renal dialysis: Secondary | ICD-10-CM | POA: Diagnosis not present

## 2021-02-21 DIAGNOSIS — D631 Anemia in chronic kidney disease: Secondary | ICD-10-CM | POA: Diagnosis not present

## 2021-02-21 DIAGNOSIS — N2581 Secondary hyperparathyroidism of renal origin: Secondary | ICD-10-CM | POA: Diagnosis not present

## 2021-02-23 DIAGNOSIS — D631 Anemia in chronic kidney disease: Secondary | ICD-10-CM | POA: Diagnosis not present

## 2021-02-23 DIAGNOSIS — Z992 Dependence on renal dialysis: Secondary | ICD-10-CM | POA: Diagnosis not present

## 2021-02-23 DIAGNOSIS — N2581 Secondary hyperparathyroidism of renal origin: Secondary | ICD-10-CM | POA: Diagnosis not present

## 2021-02-23 DIAGNOSIS — N186 End stage renal disease: Secondary | ICD-10-CM | POA: Diagnosis not present

## 2021-02-25 DIAGNOSIS — N186 End stage renal disease: Secondary | ICD-10-CM | POA: Diagnosis not present

## 2021-02-25 DIAGNOSIS — I120 Hypertensive chronic kidney disease with stage 5 chronic kidney disease or end stage renal disease: Secondary | ICD-10-CM | POA: Diagnosis not present

## 2021-02-25 DIAGNOSIS — Z992 Dependence on renal dialysis: Secondary | ICD-10-CM | POA: Diagnosis not present

## 2021-02-27 DIAGNOSIS — Z20822 Contact with and (suspected) exposure to covid-19: Secondary | ICD-10-CM | POA: Diagnosis not present

## 2021-02-28 DIAGNOSIS — D631 Anemia in chronic kidney disease: Secondary | ICD-10-CM | POA: Diagnosis not present

## 2021-02-28 DIAGNOSIS — N2581 Secondary hyperparathyroidism of renal origin: Secondary | ICD-10-CM | POA: Diagnosis not present

## 2021-02-28 DIAGNOSIS — Z992 Dependence on renal dialysis: Secondary | ICD-10-CM | POA: Diagnosis not present

## 2021-02-28 DIAGNOSIS — N186 End stage renal disease: Secondary | ICD-10-CM | POA: Diagnosis not present

## 2021-03-02 DIAGNOSIS — D631 Anemia in chronic kidney disease: Secondary | ICD-10-CM | POA: Diagnosis not present

## 2021-03-02 DIAGNOSIS — N186 End stage renal disease: Secondary | ICD-10-CM | POA: Diagnosis not present

## 2021-03-02 DIAGNOSIS — Z992 Dependence on renal dialysis: Secondary | ICD-10-CM | POA: Diagnosis not present

## 2021-03-02 DIAGNOSIS — N2581 Secondary hyperparathyroidism of renal origin: Secondary | ICD-10-CM | POA: Diagnosis not present

## 2021-03-05 DIAGNOSIS — N2581 Secondary hyperparathyroidism of renal origin: Secondary | ICD-10-CM | POA: Diagnosis not present

## 2021-03-05 DIAGNOSIS — D631 Anemia in chronic kidney disease: Secondary | ICD-10-CM | POA: Diagnosis not present

## 2021-03-05 DIAGNOSIS — N186 End stage renal disease: Secondary | ICD-10-CM | POA: Diagnosis not present

## 2021-03-05 DIAGNOSIS — Z992 Dependence on renal dialysis: Secondary | ICD-10-CM | POA: Diagnosis not present

## 2021-03-09 DIAGNOSIS — D631 Anemia in chronic kidney disease: Secondary | ICD-10-CM | POA: Diagnosis not present

## 2021-03-09 DIAGNOSIS — Z992 Dependence on renal dialysis: Secondary | ICD-10-CM | POA: Diagnosis not present

## 2021-03-09 DIAGNOSIS — N186 End stage renal disease: Secondary | ICD-10-CM | POA: Diagnosis not present

## 2021-03-09 DIAGNOSIS — N2581 Secondary hyperparathyroidism of renal origin: Secondary | ICD-10-CM | POA: Diagnosis not present

## 2021-03-12 DIAGNOSIS — Z992 Dependence on renal dialysis: Secondary | ICD-10-CM | POA: Diagnosis not present

## 2021-03-12 DIAGNOSIS — D631 Anemia in chronic kidney disease: Secondary | ICD-10-CM | POA: Diagnosis not present

## 2021-03-12 DIAGNOSIS — N186 End stage renal disease: Secondary | ICD-10-CM | POA: Diagnosis not present

## 2021-03-12 DIAGNOSIS — N2581 Secondary hyperparathyroidism of renal origin: Secondary | ICD-10-CM | POA: Diagnosis not present

## 2021-03-14 DIAGNOSIS — D631 Anemia in chronic kidney disease: Secondary | ICD-10-CM | POA: Diagnosis not present

## 2021-03-14 DIAGNOSIS — N2581 Secondary hyperparathyroidism of renal origin: Secondary | ICD-10-CM | POA: Diagnosis not present

## 2021-03-14 DIAGNOSIS — Z992 Dependence on renal dialysis: Secondary | ICD-10-CM | POA: Diagnosis not present

## 2021-03-14 DIAGNOSIS — N186 End stage renal disease: Secondary | ICD-10-CM | POA: Diagnosis not present

## 2021-03-16 DIAGNOSIS — Z992 Dependence on renal dialysis: Secondary | ICD-10-CM | POA: Diagnosis not present

## 2021-03-16 DIAGNOSIS — N2581 Secondary hyperparathyroidism of renal origin: Secondary | ICD-10-CM | POA: Diagnosis not present

## 2021-03-16 DIAGNOSIS — D631 Anemia in chronic kidney disease: Secondary | ICD-10-CM | POA: Diagnosis not present

## 2021-03-16 DIAGNOSIS — N186 End stage renal disease: Secondary | ICD-10-CM | POA: Diagnosis not present

## 2021-03-19 DIAGNOSIS — D631 Anemia in chronic kidney disease: Secondary | ICD-10-CM | POA: Diagnosis not present

## 2021-03-19 DIAGNOSIS — Z992 Dependence on renal dialysis: Secondary | ICD-10-CM | POA: Diagnosis not present

## 2021-03-19 DIAGNOSIS — N186 End stage renal disease: Secondary | ICD-10-CM | POA: Diagnosis not present

## 2021-03-19 DIAGNOSIS — N2581 Secondary hyperparathyroidism of renal origin: Secondary | ICD-10-CM | POA: Diagnosis not present

## 2021-03-20 DIAGNOSIS — Z992 Dependence on renal dialysis: Secondary | ICD-10-CM | POA: Diagnosis not present

## 2021-03-20 DIAGNOSIS — N2581 Secondary hyperparathyroidism of renal origin: Secondary | ICD-10-CM | POA: Diagnosis not present

## 2021-03-20 DIAGNOSIS — D631 Anemia in chronic kidney disease: Secondary | ICD-10-CM | POA: Diagnosis not present

## 2021-03-20 DIAGNOSIS — N186 End stage renal disease: Secondary | ICD-10-CM | POA: Diagnosis not present

## 2021-03-20 DIAGNOSIS — R300 Dysuria: Secondary | ICD-10-CM | POA: Diagnosis not present

## 2021-03-23 DIAGNOSIS — N186 End stage renal disease: Secondary | ICD-10-CM | POA: Diagnosis not present

## 2021-03-23 DIAGNOSIS — D631 Anemia in chronic kidney disease: Secondary | ICD-10-CM | POA: Diagnosis not present

## 2021-03-23 DIAGNOSIS — Z992 Dependence on renal dialysis: Secondary | ICD-10-CM | POA: Diagnosis not present

## 2021-03-23 DIAGNOSIS — N2581 Secondary hyperparathyroidism of renal origin: Secondary | ICD-10-CM | POA: Diagnosis not present

## 2021-03-27 DIAGNOSIS — Z992 Dependence on renal dialysis: Secondary | ICD-10-CM | POA: Diagnosis not present

## 2021-03-27 DIAGNOSIS — D631 Anemia in chronic kidney disease: Secondary | ICD-10-CM | POA: Diagnosis not present

## 2021-03-27 DIAGNOSIS — N186 End stage renal disease: Secondary | ICD-10-CM | POA: Diagnosis not present

## 2021-03-27 DIAGNOSIS — N2581 Secondary hyperparathyroidism of renal origin: Secondary | ICD-10-CM | POA: Diagnosis not present

## 2021-03-28 DIAGNOSIS — I120 Hypertensive chronic kidney disease with stage 5 chronic kidney disease or end stage renal disease: Secondary | ICD-10-CM | POA: Diagnosis not present

## 2021-03-28 DIAGNOSIS — Z992 Dependence on renal dialysis: Secondary | ICD-10-CM | POA: Diagnosis not present

## 2021-03-28 DIAGNOSIS — N186 End stage renal disease: Secondary | ICD-10-CM | POA: Diagnosis not present

## 2021-03-28 DIAGNOSIS — D631 Anemia in chronic kidney disease: Secondary | ICD-10-CM | POA: Diagnosis not present

## 2021-03-28 DIAGNOSIS — N2581 Secondary hyperparathyroidism of renal origin: Secondary | ICD-10-CM | POA: Diagnosis not present

## 2021-03-30 DIAGNOSIS — D631 Anemia in chronic kidney disease: Secondary | ICD-10-CM | POA: Diagnosis not present

## 2021-03-30 DIAGNOSIS — N186 End stage renal disease: Secondary | ICD-10-CM | POA: Diagnosis not present

## 2021-03-30 DIAGNOSIS — N2581 Secondary hyperparathyroidism of renal origin: Secondary | ICD-10-CM | POA: Diagnosis not present

## 2021-03-30 DIAGNOSIS — Z992 Dependence on renal dialysis: Secondary | ICD-10-CM | POA: Diagnosis not present

## 2021-04-02 DIAGNOSIS — N2581 Secondary hyperparathyroidism of renal origin: Secondary | ICD-10-CM | POA: Diagnosis not present

## 2021-04-02 DIAGNOSIS — D631 Anemia in chronic kidney disease: Secondary | ICD-10-CM | POA: Diagnosis not present

## 2021-04-02 DIAGNOSIS — N186 End stage renal disease: Secondary | ICD-10-CM | POA: Diagnosis not present

## 2021-04-02 DIAGNOSIS — Z992 Dependence on renal dialysis: Secondary | ICD-10-CM | POA: Diagnosis not present

## 2021-04-03 DIAGNOSIS — H6501 Acute serous otitis media, right ear: Secondary | ICD-10-CM | POA: Diagnosis not present

## 2021-04-04 ENCOUNTER — Emergency Department (HOSPITAL_COMMUNITY): Payer: Medicare Other

## 2021-04-04 ENCOUNTER — Emergency Department (HOSPITAL_COMMUNITY)
Admission: EM | Admit: 2021-04-04 | Discharge: 2021-04-04 | Disposition: A | Discharge status: Home / Self Care | Payer: Medicare Other | Attending: Emergency Medicine | Admitting: Emergency Medicine

## 2021-04-04 ENCOUNTER — Encounter (HOSPITAL_COMMUNITY): Payer: Self-pay

## 2021-04-04 DIAGNOSIS — Q620 Congenital hydronephrosis: Secondary | ICD-10-CM | POA: Diagnosis not present

## 2021-04-04 DIAGNOSIS — Z992 Dependence on renal dialysis: Secondary | ICD-10-CM | POA: Insufficient documentation

## 2021-04-04 DIAGNOSIS — N186 End stage renal disease: Secondary | ICD-10-CM | POA: Insufficient documentation

## 2021-04-04 DIAGNOSIS — R1013 Epigastric pain: Secondary | ICD-10-CM | POA: Insufficient documentation

## 2021-04-04 DIAGNOSIS — R0602 Shortness of breath: Secondary | ICD-10-CM | POA: Insufficient documentation

## 2021-04-04 DIAGNOSIS — R112 Nausea with vomiting, unspecified: Secondary | ICD-10-CM

## 2021-04-04 DIAGNOSIS — R1110 Vomiting, unspecified: Secondary | ICD-10-CM | POA: Insufficient documentation

## 2021-04-04 DIAGNOSIS — Z91010 Allergy to peanuts: Secondary | ICD-10-CM | POA: Insufficient documentation

## 2021-04-04 DIAGNOSIS — Z91040 Latex allergy status: Secondary | ICD-10-CM | POA: Diagnosis not present

## 2021-04-04 DIAGNOSIS — R0989 Other specified symptoms and signs involving the circulatory and respiratory systems: Secondary | ICD-10-CM | POA: Diagnosis not present

## 2021-04-04 DIAGNOSIS — Z20822 Contact with and (suspected) exposure to covid-19: Secondary | ICD-10-CM | POA: Insufficient documentation

## 2021-04-04 DIAGNOSIS — N3289 Other specified disorders of bladder: Secondary | ICD-10-CM | POA: Diagnosis not present

## 2021-04-04 DIAGNOSIS — Z79899 Other long term (current) drug therapy: Secondary | ICD-10-CM | POA: Insufficient documentation

## 2021-04-04 DIAGNOSIS — Q059 Spina bifida, unspecified: Secondary | ICD-10-CM | POA: Diagnosis not present

## 2021-04-04 DIAGNOSIS — I120 Hypertensive chronic kidney disease with stage 5 chronic kidney disease or end stage renal disease: Secondary | ICD-10-CM | POA: Insufficient documentation

## 2021-04-04 DIAGNOSIS — D631 Anemia in chronic kidney disease: Secondary | ICD-10-CM | POA: Insufficient documentation

## 2021-04-04 DIAGNOSIS — I10 Essential (primary) hypertension: Secondary | ICD-10-CM | POA: Diagnosis not present

## 2021-04-04 DIAGNOSIS — R10817 Generalized abdominal tenderness: Secondary | ICD-10-CM | POA: Diagnosis not present

## 2021-04-04 DIAGNOSIS — R1084 Generalized abdominal pain: Secondary | ICD-10-CM | POA: Diagnosis not present

## 2021-04-04 DIAGNOSIS — Z87891 Personal history of nicotine dependence: Secondary | ICD-10-CM | POA: Insufficient documentation

## 2021-04-04 DIAGNOSIS — K3189 Other diseases of stomach and duodenum: Secondary | ICD-10-CM | POA: Diagnosis not present

## 2021-04-04 DIAGNOSIS — N2581 Secondary hyperparathyroidism of renal origin: Secondary | ICD-10-CM | POA: Diagnosis not present

## 2021-04-04 DIAGNOSIS — R109 Unspecified abdominal pain: Secondary | ICD-10-CM | POA: Diagnosis not present

## 2021-04-04 LAB — URINALYSIS, ROUTINE W REFLEX MICROSCOPIC
Bilirubin Urine: NEGATIVE
Glucose, UA: 100 mg/dL — AB
Ketones, ur: NEGATIVE mg/dL
Nitrite: NEGATIVE
Protein, ur: 100 mg/dL — AB
Specific Gravity, Urine: 1.02 (ref 1.005–1.030)
pH: 8.5 — ABNORMAL HIGH (ref 5.0–8.0)

## 2021-04-04 LAB — COMPREHENSIVE METABOLIC PANEL
ALT: 19 U/L (ref 0–44)
AST: 22 U/L (ref 15–41)
Albumin: 3.8 g/dL (ref 3.5–5.0)
Alkaline Phosphatase: 51 U/L (ref 38–126)
Anion gap: 10 (ref 5–15)
BUN: 21 mg/dL — ABNORMAL HIGH (ref 6–20)
CO2: 27 mmol/L (ref 22–32)
Calcium: 9 mg/dL (ref 8.9–10.3)
Chloride: 97 mmol/L — ABNORMAL LOW (ref 98–111)
Creatinine, Ser: 3.53 mg/dL — ABNORMAL HIGH (ref 0.44–1.00)
GFR, Estimated: 17 mL/min — ABNORMAL LOW (ref >60)
Glucose, Bld: 82 mg/dL (ref 70–99)
Potassium: 3.7 mmol/L (ref 3.5–5.1)
Sodium: 134 mmol/L — ABNORMAL LOW (ref 135–145)
Total Bilirubin: 0.6 mg/dL (ref 0.3–1.2)
Total Protein: 7.8 g/dL (ref 6.5–8.1)

## 2021-04-04 LAB — URINALYSIS, MICROSCOPIC (REFLEX)

## 2021-04-04 LAB — RESP PANEL BY RT-PCR (FLU A&B, COVID) ARPGX2
Influenza A by PCR: NEGATIVE
Influenza B by PCR: NEGATIVE
SARS Coronavirus 2 by RT PCR: NEGATIVE

## 2021-04-04 LAB — CBC
HCT: 37.1 % (ref 36.0–46.0)
Hemoglobin: 11 g/dL — ABNORMAL LOW (ref 12.0–15.0)
MCH: 26.8 pg (ref 26.0–34.0)
MCHC: 29.6 g/dL — ABNORMAL LOW (ref 30.0–36.0)
MCV: 90.3 fL (ref 80.0–100.0)
Platelets: 178 10*3/uL (ref 150–400)
RBC: 4.11 MIL/uL (ref 3.87–5.11)
RDW: 19.3 % — ABNORMAL HIGH (ref 11.5–15.5)
WBC: 8 10*3/uL (ref 4.0–10.5)
nRBC: 0 % (ref 0.0–0.2)

## 2021-04-04 LAB — MAGNESIUM: Magnesium: 2.2 mg/dL (ref 1.7–2.4)

## 2021-04-04 LAB — LIPASE, BLOOD: Lipase: 56 U/L — ABNORMAL HIGH (ref 11–51)

## 2021-04-04 LAB — BRAIN NATRIURETIC PEPTIDE: B Natriuretic Peptide: 1317 pg/mL — ABNORMAL HIGH (ref 0.0–100.0)

## 2021-04-04 MED ORDER — ONDANSETRON HCL 4 MG PO TABS: 4.0000 mg | ORAL_TABLET | Freq: Four times a day (QID) | ORAL | 0 refills | Status: DC | PRN

## 2021-04-04 MED ORDER — ONDANSETRON HCL 4 MG/2ML IJ SOLN
4.0000 mg | Freq: Once | INTRAMUSCULAR | Status: AC
  Administered 2021-04-04: 4 mg via INTRAVENOUS
  Filled 2021-04-04: qty 2

## 2021-04-04 NOTE — ED Notes (Signed)
PO fluids given at this time.

## 2021-04-04 NOTE — Discharge Instructions (Addendum)
Lab work imaging are all reassuring please continue with all home medication as prescribed.  Given you prescription for Zofran please use as needed for nausea and vomiting.  Of note you have a enlarged left ovary please follow-up with your OB/GYN for further evaluation given the contact information 1 above if you need 1.  Come back to the emergency department if you develop chest pain, shortness of breath, severe abdominal pain, uncontrolled nausea, vomiting, diarrhea.

## 2021-04-04 NOTE — ED Provider Notes (Signed)
[facility] EMERGENCY DEPARTMENT Provider Note   CSN: [id number] Arrival date & time: 04/04/21  1048     History Chief Complaint  Patient presents with   Vomiting    During dialysis removed 1.5 L fluid during dialysis    [name] is a 30 y.o. female.  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  Patient states vomiting started today, states that she got her dialysis treatment and started to vomit, she only 10 minutes of her treatment, she denies hematemesis or coffee-ground emesis, states she has associated stomach pain pain which is  generalized, this also started today, no associated constipation or diarrhea.  She does note that she has had a slight cough for the last week, states is nonproductive, does not endorse headaches, fevers, chills, nasal congestion, chest pain or shortness of breath orthopnea, she denies recent sick contacts.  She has no significant abdominal history, has never had this in the past.  [facility] endorses that she got her full dialysis treatment on Tuesday.  Patient has no other complaints at this time.  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   Depression 05/14/2015   Dialysis care    ESRD (end stage renal disease) on dialysis [facility])    GERD (gastroesophageal reflux disease) 01/07/2017   HTN (hypertension) 05/02/2011   Spina bifida    UTI (lower urinary tract infection)     Patient Active Problem List   Diagnosis Date Noted   Sepsis (HCC) 05/08/2020   Dialysis AV fistula malfunction (HCC)    UTI (urinary tract infection) 04/29/2020   Acute UTI 05/07/2018   Tobacco use 05/07/2018   Hypokalemia 05/07/2018   GERD  (gastroesophageal reflux disease) 01/07/2017   Nausea with vomiting 01/07/2017   Pyelonephritis 07/31/2015   Back pain 07/31/2015   Abdominal pain 07/31/2015   Constipation 07/31/2015   Major depressive disorder, single episode, 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   Spina bifida (HCC) 01/18/2012   ESRD (end stage renal disease) (HCC) 05/02/2011   Anemia 05/02/2011   HTN (hypertension) 05/02/2011    Past Surgical History:  Procedure Laterality Date   AV FISTULA PLACEMENT     left arm   INSERTION OF DIALYSIS CATHETER N/A 05/08/2020   Procedure: ATTEMPTED, UNSUCCESSFUL INSERTION OF DIALYSIS CATHETER TUNNELED RIGHT INTERNAL JUGULAR;  Surgeon: Lucretia Roers, MD;  Location: [facility];  Service: General;  Laterality: N/A;   IR FLUORO GUIDE CV LINE RIGHT  05/09/2020   IR US GUIDE VASC ACCESS RIGHT  05/09/2020     OB History     Gravida  0   Para  0   Term  0   Preterm  0   AB  0   Living  0      SAB  0   IAB  0   Ectopic  0   Multiple  0   Live Births  0           Family History  Problem Relation Age of Onset   Kidney cancer Other    Hypertension Maternal Grandmother    Arthritis Maternal Grandmother    Breast cancer Maternal Aunt    Colon cancer Neg Hx  Social History   Tobacco Use   Smoking status: Former    Packs/day: 0.10    Types: Cigarettes   Smokeless tobacco: Never   Tobacco comments:    2 cigs a day  Vaping Use   Vaping Use: Never used  Substance Use Topics   Alcohol use: No   Drug use: No    Home Medications Prior to Admission medications   Medication Sig Start Date End Date Taking? Authorizing Provider  acetaminophen (TYLENOL) 500 MG tablet Take 1,000 mg by mouth every 6 (six) hours as needed for mild pain or headache.    [provider]  amLODipine (NORVASC) 5 MG tablet Take 5 mg by mouth at bedtime. 04/05/20   [provider]  AURYXIA 1 GM 210 MG(Fe) tablet Take 2 tablets by mouth 3 (three)  times daily with meals. 04/15/18   [provider]  B Complex-C-Folic Acid (DIALYVITE 161) 0.8 MG TABS Take 0.8 mg by mouth daily.    [provider]  diphenhydrAMINE (BENADRYL) 25 mg capsule Take 25 mg by mouth daily as needed for allergies.    [provider]  Epoetin Alfa (EPOGEN IJ) Inject as directed See admin instructions. Every Tuesday, Thursday, Saturday    [provider]  hydrALAZINE (APRESOLINE) 50 MG tablet Take 50 mg by mouth 3 (three) times daily. 04/28/20   [provider]  metoCLOPramide (REGLAN) 5 MG tablet Take 1 tablet (5 mg total) by mouth 4 (four) times daily. Take one tablet 1-2 hours before dialysis 11/02/19   Doran Stabler, MD  metoprolol succinate (TOPROL-XL) 50 MG 24 hr tablet Take 50 mg by mouth daily. 12/29/19   [provider]  montelukast (SINGULAIR) 10 MG tablet Take 10 mg by mouth at bedtime. 04/27/20   [provider]  multivitamin (RENA-VIT) TABS tablet Take 1 tablet by mouth See admin instructions. Every Tuesday, Thursday, Saturday    [provider]  omeprazole (PRILOSEC) 20 MG capsule Take 20 mg by mouth daily.  03/26/16   [provider]    Allergies    Ciprofloxacin; Other; Peanut-containing drug products; Aleve [naproxen sodium]; Ceftriaxone; Coconut oil; Influenza vaccines; Tetanus toxoid, adsorbed; Tetanus toxoids; and Latex  Review of Systems   Review of Systems  Constitutional:  Negative for chills and fever.  HENT:  Positive for congestion.   Respiratory:  Positive for cough. Negative for shortness of breath.   Cardiovascular:  Negative for chest pain.  Gastrointestinal:  Positive for abdominal pain, nausea and vomiting. Negative for constipation and diarrhea.  Genitourinary:  Negative for enuresis.  Musculoskeletal:  Negative for back pain and myalgias.  Skin:  Negative for rash.  Neurological:  Negative for dizziness and headaches.  Hematological:  Does not  bruise/bleed easily.   Physical Exam Updated Vital Signs BP (!) 181/101 (BP Location: Right Arm)   Pulse 75   Temp 97.7 F (36.5 C) (Oral)   Resp 18   Wt 28 kg   SpO2 99%   BMI 75.35 kg/m   Physical Exam Vitals and nursing note reviewed.  Constitutional:      General: She is not in acute distress.    Appearance: She is not ill-appearing.  HENT:     Head: Normocephalic and atraumatic.     Nose: No congestion.     Mouth/Throat:     Mouth: Mucous membranes are moist.  Eyes:     Conjunctiva/sclera: Conjunctivae normal.  Cardiovascular:     Rate and Rhythm: Normal rate  and regular rhythm.     Pulses: Normal pulses.     Heart sounds: No murmur heard.   No friction rub. No gallop.  Pulmonary:     Effort: No respiratory distress.     Breath sounds: No wheezing, rhonchi or rales.  Abdominal:     Comments: Abdomen nondistended normal bowel sounds, dull to percussion, generalized abdominal tenderness, seems worse in the right upper quadrant, no guarding, rebound tenderness, peritoneal sign, negative CVA tenderness.  Musculoskeletal:     Comments: Spine was palpated was nontender to palpation.  Bilateral amputee below the hips.  Skin:    General: Skin is warm and dry.     Comments: Patient has a fistula left AV no surrounding erythema or edema good palpable thrill noted.  Neurological:     Mental Status: She is alert.  Psychiatric:        Mood and Affect: Mood normal.    ED Results / Procedures / Treatments   Labs (all labs ordered are listed, but only abnormal results are displayed) Labs Reviewed  LIPASE, BLOOD - Abnormal; Notable for the following components:      Result Value   Lipase 56 (*)    All other components within normal limits  COMPREHENSIVE METABOLIC PANEL - Abnormal; Notable for the following components:   Sodium 134 (*)    Chloride 97 (*)    BUN 21 (*)    Creatinine, Ser 3.53 (*)    GFR, Estimated 17 (*)    All other components within normal limits   CBC - Abnormal; Notable for the following components:   Hemoglobin 11.0 (*)    MCHC 29.6 (*)    RDW 19.3 (*)    All other components within normal limits  URINALYSIS, ROUTINE W REFLEX MICROSCOPIC - Abnormal; Notable for the following components:   pH 8.5 (*)    Glucose, UA 100 (*)    Hgb urine dipstick SMALL (*)    Protein, ur 100 (*)    Leukocytes,Ua LARGE (*)    All other components within normal limits  BRAIN NATRIURETIC PEPTIDE - Abnormal; Notable for the following components:   B Natriuretic Peptide 1,317.0 (*)    All other components within normal limits  RESP PANEL BY RT-PCR (FLU A&B, COVID) ARPGX2  MAGNESIUM  URINALYSIS, MICROSCOPIC (REFLEX)  POC URINE PREG, ED    EKG None  Radiology DG Chest Port 1 View  Result Date: 04/04/2021 CLINICAL DATA:  R[place]. EXAM: PORTABLE CHEST 1 VIEW COMPARISON:  May 08, 2020. FINDINGS: Stable cardiomediastinal silhouette. Both lungs are clear. The visualized skeletal structures are unremarkable. IMPRESSION: No active disease. Electronically Signed   By: Marijo Conception M.D.   On: 04/04/2021 13:21    Procedures Procedures   Medications Ordered in ED Medications  ondansetron (ZOFRAN) injection 4 mg (4 mg Intravenous Given 04/04/21 1219)    ED Course  I have reviewed the triage vital signs and the nursing notes.  Pertinent labs & imaging results that were available during my care of the patient were reviewed by me and considered in my medical decision making (see chart for details).    MDM Rules/Calculators/A&P                          Initial impression-patient presents with stomach pain and nausea vomiting.  She is alert, does not appear in acute stress, vital signs reassuring, will obtain basic lab work-up, obtain CT imaging and reassess.  Work-up-CBC  shows normocytic anemia hemoglobin 11.0, CMP shows hyponatremia 134, creatinine 3.53, lipase 56, UA shows large leukocytes, no red blood cells, 11-20 white blood cells, rare  bacteria, respiratory panel negative, BNP 1317,, chest x-ray unremarkable, CT abdomen pelvis reveals marked thickening of the ureter bladder wall similar to prior studies, locule of air seen in the bladder recent infection versus instrumentation.  Thickening of the stomach wall may be due to underdistention versus gastritis, no evidence of bowel obstruction, increased size of the left ovary that measures up to 8.6 cm,  Reassessment-patient was reassessed, states she is feeling much better, has no complaints at this time, vital signs remained stable.  Abdomen was reassessed it was nontender to palpation, will p.o. challenge the patient.  And reassess.    Patient is reassessed, tolerating p.o., vital signs remained stable, patient agreed for discharge at this time.  Rule out-low suspicion for emergent hemodialysis as patient is not volume overloaded on my exam, chest x-ray is unremarkable, no electrolyte abnormalities.  She does have an elevated BNP but with normal lung sounds, nontachypneic, not hypoxic, no signs of edema present on my exam likely this could be her baseline.  I have low suspicion for liver or gallbladder abnormality as she has no elevation liver enzymes, alk phos, T bili.  I have low suspicion for pancreatitis as lipase is only slightly elevated at 56. Low Suspicion for bowel obstruction, CT imaging is negative for this.  Low suspicion for UTI and/or [place] as patient is nontoxic-appearing, vital signs reassuring, no leukocytosis, her UA does appear abnormal but this comparative to other UAs is at her baseline actually looks better as she has fewer white blood cells, fewer bacteria, will defer antibiotic treatment and culture at this time.  Plan-  Stomach pain since resolved-possible this was a bout of gastritis, will provide her with Zofran, recommend over-the-counter pain medication, follow-up with her PCP as needed. Enlarged left ovary-patient was was made aware this, we will have her  follow-up with her OB/GYN for further evaluation.  Vital signs have remained stable, no indication for hospital admission.  Patient discussed with attending and they agreed with assessment and plan.  Patient given at home care as well strict return precautions.  Patient verbalized that they understood agreed to said plan.  Final Clinical Impression(s) / ED Diagnoses Final diagnoses:  None    Rx / DC Orders ED Discharge Orders     None        Marcello Fennel, PA-C 04/04/21 1524    Fredia Sorrow, MD 04/05/21 [phone]

## 2021-04-06 DIAGNOSIS — N186 End stage renal disease: Secondary | ICD-10-CM | POA: Diagnosis not present

## 2021-04-06 DIAGNOSIS — Z992 Dependence on renal dialysis: Secondary | ICD-10-CM | POA: Diagnosis not present

## 2021-04-06 DIAGNOSIS — D631 Anemia in chronic kidney disease: Secondary | ICD-10-CM | POA: Diagnosis not present

## 2021-04-06 DIAGNOSIS — N2581 Secondary hyperparathyroidism of renal origin: Secondary | ICD-10-CM | POA: Diagnosis not present

## 2021-04-09 DIAGNOSIS — D631 Anemia in chronic kidney disease: Secondary | ICD-10-CM | POA: Diagnosis not present

## 2021-04-09 DIAGNOSIS — N186 End stage renal disease: Secondary | ICD-10-CM | POA: Diagnosis not present

## 2021-04-09 DIAGNOSIS — N2581 Secondary hyperparathyroidism of renal origin: Secondary | ICD-10-CM | POA: Diagnosis not present

## 2021-04-09 DIAGNOSIS — Z992 Dependence on renal dialysis: Secondary | ICD-10-CM | POA: Diagnosis not present

## 2021-04-11 DIAGNOSIS — N2581 Secondary hyperparathyroidism of renal origin: Secondary | ICD-10-CM | POA: Diagnosis not present

## 2021-04-11 DIAGNOSIS — D631 Anemia in chronic kidney disease: Secondary | ICD-10-CM | POA: Diagnosis not present

## 2021-04-11 DIAGNOSIS — N186 End stage renal disease: Secondary | ICD-10-CM | POA: Diagnosis not present

## 2021-04-11 DIAGNOSIS — Z992 Dependence on renal dialysis: Secondary | ICD-10-CM | POA: Diagnosis not present

## 2021-04-13 DIAGNOSIS — D631 Anemia in chronic kidney disease: Secondary | ICD-10-CM | POA: Diagnosis not present

## 2021-04-13 DIAGNOSIS — N2581 Secondary hyperparathyroidism of renal origin: Secondary | ICD-10-CM | POA: Diagnosis not present

## 2021-04-13 DIAGNOSIS — Z992 Dependence on renal dialysis: Secondary | ICD-10-CM | POA: Diagnosis not present

## 2021-04-13 DIAGNOSIS — N186 End stage renal disease: Secondary | ICD-10-CM | POA: Diagnosis not present

## 2021-04-16 DIAGNOSIS — Z992 Dependence on renal dialysis: Secondary | ICD-10-CM | POA: Diagnosis not present

## 2021-04-16 DIAGNOSIS — N186 End stage renal disease: Secondary | ICD-10-CM | POA: Diagnosis not present

## 2021-04-16 DIAGNOSIS — D631 Anemia in chronic kidney disease: Secondary | ICD-10-CM | POA: Diagnosis not present

## 2021-04-16 DIAGNOSIS — N2581 Secondary hyperparathyroidism of renal origin: Secondary | ICD-10-CM | POA: Diagnosis not present

## 2021-04-18 DIAGNOSIS — D631 Anemia in chronic kidney disease: Secondary | ICD-10-CM | POA: Diagnosis not present

## 2021-04-18 DIAGNOSIS — N186 End stage renal disease: Secondary | ICD-10-CM | POA: Diagnosis not present

## 2021-04-18 DIAGNOSIS — N2581 Secondary hyperparathyroidism of renal origin: Secondary | ICD-10-CM | POA: Diagnosis not present

## 2021-04-18 DIAGNOSIS — Z992 Dependence on renal dialysis: Secondary | ICD-10-CM | POA: Diagnosis not present

## 2021-04-20 DIAGNOSIS — N2581 Secondary hyperparathyroidism of renal origin: Secondary | ICD-10-CM | POA: Diagnosis not present

## 2021-04-20 DIAGNOSIS — D631 Anemia in chronic kidney disease: Secondary | ICD-10-CM | POA: Diagnosis not present

## 2021-04-20 DIAGNOSIS — N186 End stage renal disease: Secondary | ICD-10-CM | POA: Diagnosis not present

## 2021-04-20 DIAGNOSIS — Z992 Dependence on renal dialysis: Secondary | ICD-10-CM | POA: Diagnosis not present

## 2021-04-22 DIAGNOSIS — Z992 Dependence on renal dialysis: Secondary | ICD-10-CM | POA: Diagnosis not present

## 2021-04-22 DIAGNOSIS — T82848A Pain from vascular prosthetic devices, implants and grafts, initial encounter: Secondary | ICD-10-CM | POA: Diagnosis not present

## 2021-04-22 DIAGNOSIS — N186 End stage renal disease: Secondary | ICD-10-CM | POA: Diagnosis not present

## 2021-04-23 DIAGNOSIS — N186 End stage renal disease: Secondary | ICD-10-CM | POA: Diagnosis not present

## 2021-04-23 DIAGNOSIS — N2581 Secondary hyperparathyroidism of renal origin: Secondary | ICD-10-CM | POA: Diagnosis not present

## 2021-04-23 DIAGNOSIS — D631 Anemia in chronic kidney disease: Secondary | ICD-10-CM | POA: Diagnosis not present

## 2021-04-23 DIAGNOSIS — Z992 Dependence on renal dialysis: Secondary | ICD-10-CM | POA: Diagnosis not present

## 2021-04-26 DIAGNOSIS — D631 Anemia in chronic kidney disease: Secondary | ICD-10-CM | POA: Diagnosis not present

## 2021-04-26 DIAGNOSIS — Z992 Dependence on renal dialysis: Secondary | ICD-10-CM | POA: Diagnosis not present

## 2021-04-26 DIAGNOSIS — N186 End stage renal disease: Secondary | ICD-10-CM | POA: Diagnosis not present

## 2021-04-26 DIAGNOSIS — N2581 Secondary hyperparathyroidism of renal origin: Secondary | ICD-10-CM | POA: Diagnosis not present

## 2021-04-27 DIAGNOSIS — N2581 Secondary hyperparathyroidism of renal origin: Secondary | ICD-10-CM | POA: Diagnosis not present

## 2021-04-27 DIAGNOSIS — N186 End stage renal disease: Secondary | ICD-10-CM | POA: Diagnosis not present

## 2021-04-27 DIAGNOSIS — D509 Iron deficiency anemia, unspecified: Secondary | ICD-10-CM | POA: Diagnosis not present

## 2021-04-27 DIAGNOSIS — D631 Anemia in chronic kidney disease: Secondary | ICD-10-CM | POA: Diagnosis not present

## 2021-04-27 DIAGNOSIS — I120 Hypertensive chronic kidney disease with stage 5 chronic kidney disease or end stage renal disease: Secondary | ICD-10-CM | POA: Diagnosis not present

## 2021-04-27 DIAGNOSIS — Z992 Dependence on renal dialysis: Secondary | ICD-10-CM | POA: Diagnosis not present

## 2021-04-30 DIAGNOSIS — N2581 Secondary hyperparathyroidism of renal origin: Secondary | ICD-10-CM | POA: Diagnosis not present

## 2021-04-30 DIAGNOSIS — N186 End stage renal disease: Secondary | ICD-10-CM | POA: Diagnosis not present

## 2021-04-30 DIAGNOSIS — D509 Iron deficiency anemia, unspecified: Secondary | ICD-10-CM | POA: Diagnosis not present

## 2021-04-30 DIAGNOSIS — Z992 Dependence on renal dialysis: Secondary | ICD-10-CM | POA: Diagnosis not present

## 2021-04-30 DIAGNOSIS — D631 Anemia in chronic kidney disease: Secondary | ICD-10-CM | POA: Diagnosis not present

## 2021-05-02 DIAGNOSIS — N186 End stage renal disease: Secondary | ICD-10-CM | POA: Diagnosis not present

## 2021-05-02 DIAGNOSIS — Z992 Dependence on renal dialysis: Secondary | ICD-10-CM | POA: Diagnosis not present

## 2021-05-02 DIAGNOSIS — D509 Iron deficiency anemia, unspecified: Secondary | ICD-10-CM | POA: Diagnosis not present

## 2021-05-02 DIAGNOSIS — D631 Anemia in chronic kidney disease: Secondary | ICD-10-CM | POA: Diagnosis not present

## 2021-05-02 DIAGNOSIS — N2581 Secondary hyperparathyroidism of renal origin: Secondary | ICD-10-CM | POA: Diagnosis not present

## 2021-05-04 DIAGNOSIS — D509 Iron deficiency anemia, unspecified: Secondary | ICD-10-CM | POA: Diagnosis not present

## 2021-05-04 DIAGNOSIS — D631 Anemia in chronic kidney disease: Secondary | ICD-10-CM | POA: Diagnosis not present

## 2021-05-04 DIAGNOSIS — N186 End stage renal disease: Secondary | ICD-10-CM | POA: Diagnosis not present

## 2021-05-04 DIAGNOSIS — N2581 Secondary hyperparathyroidism of renal origin: Secondary | ICD-10-CM | POA: Diagnosis not present

## 2021-05-04 DIAGNOSIS — Z992 Dependence on renal dialysis: Secondary | ICD-10-CM | POA: Diagnosis not present

## 2021-05-06 ENCOUNTER — Other Ambulatory Visit: Payer: Self-pay

## 2021-05-06 ENCOUNTER — Encounter: Payer: Self-pay | Admitting: Emergency Medicine

## 2021-05-06 ENCOUNTER — Ambulatory Visit
Admission: EM | Admit: 2021-05-06 | Discharge: 2021-05-06 | Disposition: A | Discharge status: Home / Self Care | Payer: Medicare Other | Attending: Physician Assistant | Admitting: Physician Assistant

## 2021-05-06 DIAGNOSIS — J019 Acute sinusitis, unspecified: Secondary | ICD-10-CM

## 2021-05-06 DIAGNOSIS — N186 End stage renal disease: Secondary | ICD-10-CM

## 2021-05-06 MED ORDER — AMOXICILLIN-POT CLAVULANATE 500-125 MG PO TABS: 1.0000 | ORAL_TABLET | Freq: Every day | ORAL | 0 refills | Status: AC

## 2021-05-06 NOTE — ED Provider Notes (Signed)
[facility]    CSN: [id number] Arrival date & time: 05/06/21  1545      History   Chief Complaint Chief Complaint  Patient presents with   Cough   Sore Throat   Otalgia    HPI [name] is a 30 y.o. female.   Patient here today for evaluation of sinus congestion and drainage, and ear pain has seemed to persist over the last few weeks.  She states she was treated for otitis media but feels her ear infection did not clear completely.  She has not had any fever.  She denies any nausea or vomiting.  She has not had significant cough.  She cannot recall the name of the antibiotic she was recently prescribed.  She is a dialysis patient.  The history is provided by the patient.  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   Depression 05/14/2015   Dialysis care    ESRD (end stage renal disease) on dialysis [facility])    GERD (gastroesophageal reflux disease) 01/07/2017   HTN (hypertension) 05/02/2011   Spina bifida    UTI (lower urinary tract infection)     Patient Active Problem List   Diagnosis Date Noted   Sepsis ([place]) 05/08/2020   Dialysis AV fistula malfunction ([place])    UTI (urinary tract infection) 04/29/2020   Acute UTI 05/07/2018   Tobacco use 05/07/2018   Hypokalemia 05/07/2018   GERD (gastroesophageal reflux disease) 01/07/2017   Nausea with vomiting 01/07/2017   Pyelonephritis 07/31/2015   Back pain 07/31/2015   Abdominal pain 07/31/2015   Constipation 07/31/2015   Major depressive disorder, single episode, severe without psychotic features ([place]) 05/15/2015   Spina bifida  ([place]) 01/18/2012   ESRD (end stage renal disease) ([place]) 05/02/2011   Anemia 05/02/2011   HTN (hypertension) 05/02/2011    Past Surgical History:  Procedure Laterality Date   AV FISTULA PLACEMENT     left arm   INSERTION OF DIALYSIS CATHETER N/A 05/08/2020   Procedure: ATTEMPTED, UNSUCCESSFUL INSERTION OF DIALYSIS CATHETER TUNNELED RIGHT INTERNAL JUGULAR;  Surgeon: Virl Cagey, MD;  Location: [facility];  Service: General;  Laterality: N/A;   IR FLUORO GUIDE CV LINE RIGHT  05/09/2020   IR US GUIDE VASC ACCESS RIGHT  05/09/2020    OB History     Gravida  0   Para  0   Term  0   Preterm  0   AB  0   Living  0      SAB  0   IAB  0   Ectopic  0   Multiple  0   Live Births  0            Home Medications    Prior to Admission medications   Medication Sig Start Date End Date Taking? Authorizing Provider  amoxicillin-clavulanate (AUGMENTIN) 500-125 MG tablet Take 1 tablet (500 mg total) by mouth daily for 7 days. 05/06/21 05/13/21 Yes Francene Finders, PA-C  acetaminophen (TYLENOL) 500 MG tablet Take 1,000 mg by mouth every 6 (six) hours as needed for  mild pain or headache.    [provider]  amLODipine (NORVASC) 5 MG tablet Take 5 mg by mouth at bedtime. 04/05/20   [provider]  AURYXIA 1 GM 210 MG(Fe) tablet Take 2 tablets by mouth 3 (three) times daily with meals. 04/15/18   [provider]  B Complex-C-Folic Acid (DIALYVITE 098) 0.8 MG TABS Take 0.8 mg by mouth daily.    [provider]  diphenhydrAMINE (BENADRYL) 25 mg capsule Take 25 mg by mouth daily as needed for allergies.    [provider]  Epoetin Alfa (EPOGEN IJ) Inject as directed See admin instructions. Every Tuesday, Thursday, Saturday    [provider]  hydrALAZINE (APRESOLINE) 50 MG tablet Take 50 mg by mouth 3 (three) times daily. 04/28/20   [provider]  metoCLOPramide (REGLAN) 5 MG tablet Take 1 tablet (5 mg total) by mouth 4  (four) times daily. Take one tablet 1-2 hours before dialysis 11/02/19   Doran Stabler, MD  metoprolol succinate (TOPROL-XL) 50 MG 24 hr tablet Take 50 mg by mouth daily. 12/29/19   [provider]  montelukast (SINGULAIR) 10 MG tablet Take 10 mg by mouth at bedtime. 04/27/20   [provider]  multivitamin (RENA-VIT) TABS tablet Take 1 tablet by mouth See admin instructions. Every Tuesday, Thursday, Saturday    [provider]  omeprazole (PRILOSEC) 20 MG capsule Take 20 mg by mouth daily.  03/26/16   [provider]  ondansetron (ZOFRAN) 4 MG tablet Take 1 tablet (4 mg total) by mouth every 6 (six) hours as needed for nausea or vomiting. 04/04/21   Marcello Fennel, PA-C    Family History Family History  Problem Relation Age of Onset   Kidney cancer Other    Hypertension Maternal Grandmother    Arthritis Maternal Grandmother    Breast cancer Maternal Aunt    Colon cancer Neg Hx     Social History Social History   Tobacco Use   Smoking status: Former    Packs/day: 0.10    Types: Cigarettes   Smokeless tobacco: Never   Tobacco comments:    2 cigs a day  Vaping Use   Vaping Use: Never used  Substance Use Topics   Alcohol use: No   Drug use: No     Allergies   Ciprofloxacin; Other; Peanut-containing drug products; Aleve [naproxen sodium]; Ceftriaxone; Coconut oil; Influenza vaccines; Tetanus toxoid, adsorbed; Tetanus toxoids; and Latex   Review of Systems Review of Systems  Constitutional:  Negative for chills and fever.  HENT:  Positive for congestion and ear pain. Negative for sore throat.   Eyes:  Negative for discharge and redness.  Respiratory:  Positive for cough.   Gastrointestinal:  Negative for nausea and vomiting.    Physical Exam Triage Vital Signs ED Triage Vitals [05/06/21 1614]  Enc Vitals Group     BP (!) 170/89     Pulse Rate 80     Resp 16     Temp 98.8 F (37.1 C)     Temp Source Oral     SpO2 97 %      Weight      Height      Head Circumference      Peak Flow      Pain Score 6     Pain Loc      Pain Edu?      Excl. in [place]?    No data found.  Updated Vital Signs  BP (!) 170/89 (BP Location: Right Arm)   Pulse 80   Temp 98.8 F (37.1 C) (Oral)   Resp 16   SpO2 97%     Physical Exam Vitals and nursing note reviewed.  Constitutional:      General: She is not in acute distress.    Appearance: She is not ill-appearing.  HENT:     Head: Normocephalic and atraumatic.     Nose: No congestion.  Eyes:     Conjunctiva/sclera: Conjunctivae normal.  Cardiovascular:     Rate and Rhythm: Normal rate.  Pulmonary:     Effort: Pulmonary effort is normal.  Neurological:     Mental Status: She is alert.  Psychiatric:        Mood and Affect: Mood normal.        Behavior: Behavior normal.     UC Treatments / Results  Labs (all labs ordered are listed, but only abnormal results are displayed) Labs Reviewed - No data to display  EKG   Radiology No results found.  Procedures Procedures (including critical care time)  Medications Ordered in UC Medications - No data to display  Initial Impression / Assessment and Plan / UC Course  I have reviewed the triage vital signs and the nursing notes.  Pertinent labs & imaging results that were available during my care of the patient were reviewed by me and considered in my medical decision making (see chart for details).   Will treat to cover sinusitis with Augmentin. Dose adjusted based on ESRD. Recommended follow up with any further concerns or if symptoms fail to improve.   Final Clinical Impressions(s) / UC Diagnoses   Final diagnoses:  Acute sinusitis, recurrence not specified, unspecified location  ESRD (end stage renal disease) [facility])   Discharge Instructions   None    ED Prescriptions     Medication Sig Dispense Auth. Provider   amoxicillin-clavulanate (AUGMENTIN) 500-125 MG tablet Take 1 tablet (500 mg total) by  mouth daily for 7 days. 7 tablet Francene Finders, PA-C      PDMP not reviewed this encounter.   Francene Finders, PA-C 05/06/21 1705

## 2021-05-06 NOTE — ED Triage Notes (Signed)
Cough, sore throat, runny nose x 2 weeks. Denies fever.

## 2021-05-07 DIAGNOSIS — D631 Anemia in chronic kidney disease: Secondary | ICD-10-CM | POA: Diagnosis not present

## 2021-05-07 DIAGNOSIS — D509 Iron deficiency anemia, unspecified: Secondary | ICD-10-CM | POA: Diagnosis not present

## 2021-05-07 DIAGNOSIS — N2581 Secondary hyperparathyroidism of renal origin: Secondary | ICD-10-CM | POA: Diagnosis not present

## 2021-05-07 DIAGNOSIS — N186 End stage renal disease: Secondary | ICD-10-CM | POA: Diagnosis not present

## 2021-05-07 DIAGNOSIS — Z992 Dependence on renal dialysis: Secondary | ICD-10-CM | POA: Diagnosis not present

## 2021-05-09 DIAGNOSIS — D631 Anemia in chronic kidney disease: Secondary | ICD-10-CM | POA: Diagnosis not present

## 2021-05-09 DIAGNOSIS — N2581 Secondary hyperparathyroidism of renal origin: Secondary | ICD-10-CM | POA: Diagnosis not present

## 2021-05-09 DIAGNOSIS — Z992 Dependence on renal dialysis: Secondary | ICD-10-CM | POA: Diagnosis not present

## 2021-05-09 DIAGNOSIS — D509 Iron deficiency anemia, unspecified: Secondary | ICD-10-CM | POA: Diagnosis not present

## 2021-05-09 DIAGNOSIS — N186 End stage renal disease: Secondary | ICD-10-CM | POA: Diagnosis not present

## 2021-05-11 DIAGNOSIS — Z992 Dependence on renal dialysis: Secondary | ICD-10-CM | POA: Diagnosis not present

## 2021-05-11 DIAGNOSIS — N186 End stage renal disease: Secondary | ICD-10-CM | POA: Diagnosis not present

## 2021-05-11 DIAGNOSIS — N2581 Secondary hyperparathyroidism of renal origin: Secondary | ICD-10-CM | POA: Diagnosis not present

## 2021-05-11 DIAGNOSIS — D509 Iron deficiency anemia, unspecified: Secondary | ICD-10-CM | POA: Diagnosis not present

## 2021-05-11 DIAGNOSIS — D631 Anemia in chronic kidney disease: Secondary | ICD-10-CM | POA: Diagnosis not present

## 2021-05-15 DIAGNOSIS — D631 Anemia in chronic kidney disease: Secondary | ICD-10-CM | POA: Diagnosis not present

## 2021-05-15 DIAGNOSIS — D509 Iron deficiency anemia, unspecified: Secondary | ICD-10-CM | POA: Diagnosis not present

## 2021-05-15 DIAGNOSIS — N2581 Secondary hyperparathyroidism of renal origin: Secondary | ICD-10-CM | POA: Diagnosis not present

## 2021-05-15 DIAGNOSIS — Z992 Dependence on renal dialysis: Secondary | ICD-10-CM | POA: Diagnosis not present

## 2021-05-15 DIAGNOSIS — N186 End stage renal disease: Secondary | ICD-10-CM | POA: Diagnosis not present

## 2021-05-16 DIAGNOSIS — D509 Iron deficiency anemia, unspecified: Secondary | ICD-10-CM | POA: Diagnosis not present

## 2021-05-16 DIAGNOSIS — Z992 Dependence on renal dialysis: Secondary | ICD-10-CM | POA: Diagnosis not present

## 2021-05-16 DIAGNOSIS — N2581 Secondary hyperparathyroidism of renal origin: Secondary | ICD-10-CM | POA: Diagnosis not present

## 2021-05-16 DIAGNOSIS — N186 End stage renal disease: Secondary | ICD-10-CM | POA: Diagnosis not present

## 2021-05-16 DIAGNOSIS — E039 Hypothyroidism, unspecified: Secondary | ICD-10-CM | POA: Diagnosis not present

## 2021-05-16 DIAGNOSIS — D631 Anemia in chronic kidney disease: Secondary | ICD-10-CM | POA: Diagnosis not present

## 2021-05-18 DIAGNOSIS — N2581 Secondary hyperparathyroidism of renal origin: Secondary | ICD-10-CM | POA: Diagnosis not present

## 2021-05-18 DIAGNOSIS — Z992 Dependence on renal dialysis: Secondary | ICD-10-CM | POA: Diagnosis not present

## 2021-05-18 DIAGNOSIS — D631 Anemia in chronic kidney disease: Secondary | ICD-10-CM | POA: Diagnosis not present

## 2021-05-18 DIAGNOSIS — N186 End stage renal disease: Secondary | ICD-10-CM | POA: Diagnosis not present

## 2021-05-18 DIAGNOSIS — D509 Iron deficiency anemia, unspecified: Secondary | ICD-10-CM | POA: Diagnosis not present

## 2021-05-21 DIAGNOSIS — D631 Anemia in chronic kidney disease: Secondary | ICD-10-CM | POA: Diagnosis not present

## 2021-05-21 DIAGNOSIS — N186 End stage renal disease: Secondary | ICD-10-CM | POA: Diagnosis not present

## 2021-05-21 DIAGNOSIS — D509 Iron deficiency anemia, unspecified: Secondary | ICD-10-CM | POA: Diagnosis not present

## 2021-05-21 DIAGNOSIS — N2581 Secondary hyperparathyroidism of renal origin: Secondary | ICD-10-CM | POA: Diagnosis not present

## 2021-05-21 DIAGNOSIS — Z992 Dependence on renal dialysis: Secondary | ICD-10-CM | POA: Diagnosis not present

## 2021-05-23 DIAGNOSIS — N186 End stage renal disease: Secondary | ICD-10-CM | POA: Diagnosis not present

## 2021-05-23 DIAGNOSIS — D509 Iron deficiency anemia, unspecified: Secondary | ICD-10-CM | POA: Diagnosis not present

## 2021-05-23 DIAGNOSIS — D631 Anemia in chronic kidney disease: Secondary | ICD-10-CM | POA: Diagnosis not present

## 2021-05-23 DIAGNOSIS — N2581 Secondary hyperparathyroidism of renal origin: Secondary | ICD-10-CM | POA: Diagnosis not present

## 2021-05-23 DIAGNOSIS — Z992 Dependence on renal dialysis: Secondary | ICD-10-CM | POA: Diagnosis not present

## 2021-05-25 DIAGNOSIS — Z992 Dependence on renal dialysis: Secondary | ICD-10-CM | POA: Diagnosis not present

## 2021-05-25 DIAGNOSIS — N186 End stage renal disease: Secondary | ICD-10-CM | POA: Diagnosis not present

## 2021-05-25 DIAGNOSIS — N2581 Secondary hyperparathyroidism of renal origin: Secondary | ICD-10-CM | POA: Diagnosis not present

## 2021-05-25 DIAGNOSIS — D509 Iron deficiency anemia, unspecified: Secondary | ICD-10-CM | POA: Diagnosis not present

## 2021-05-25 DIAGNOSIS — D631 Anemia in chronic kidney disease: Secondary | ICD-10-CM | POA: Diagnosis not present

## 2021-05-28 DIAGNOSIS — D631 Anemia in chronic kidney disease: Secondary | ICD-10-CM | POA: Diagnosis not present

## 2021-05-28 DIAGNOSIS — N2581 Secondary hyperparathyroidism of renal origin: Secondary | ICD-10-CM | POA: Diagnosis not present

## 2021-05-28 DIAGNOSIS — N186 End stage renal disease: Secondary | ICD-10-CM | POA: Diagnosis not present

## 2021-05-28 DIAGNOSIS — Z992 Dependence on renal dialysis: Secondary | ICD-10-CM | POA: Diagnosis not present

## 2021-05-28 DIAGNOSIS — I120 Hypertensive chronic kidney disease with stage 5 chronic kidney disease or end stage renal disease: Secondary | ICD-10-CM | POA: Diagnosis not present

## 2021-05-31 ENCOUNTER — Emergency Department (HOSPITAL_COMMUNITY): Payer: Medicare Other

## 2021-05-31 ENCOUNTER — Encounter (HOSPITAL_COMMUNITY): Payer: Self-pay

## 2021-05-31 ENCOUNTER — Emergency Department (HOSPITAL_COMMUNITY)
Admission: EM | Admit: 2021-05-31 | Discharge: 2021-05-31 | Disposition: A | Discharge status: Home / Self Care | Payer: Medicare Other | Attending: Emergency Medicine | Admitting: Emergency Medicine

## 2021-05-31 DIAGNOSIS — N9489 Other specified conditions associated with female genital organs and menstrual cycle: Secondary | ICD-10-CM | POA: Diagnosis not present

## 2021-05-31 DIAGNOSIS — Z992 Dependence on renal dialysis: Secondary | ICD-10-CM | POA: Insufficient documentation

## 2021-05-31 DIAGNOSIS — Z79899 Other long term (current) drug therapy: Secondary | ICD-10-CM | POA: Insufficient documentation

## 2021-05-31 DIAGNOSIS — Z87891 Personal history of nicotine dependence: Secondary | ICD-10-CM | POA: Diagnosis not present

## 2021-05-31 DIAGNOSIS — R0789 Other chest pain: Secondary | ICD-10-CM | POA: Diagnosis not present

## 2021-05-31 DIAGNOSIS — N186 End stage renal disease: Secondary | ICD-10-CM | POA: Diagnosis not present

## 2021-05-31 DIAGNOSIS — R079 Chest pain, unspecified: Secondary | ICD-10-CM | POA: Diagnosis not present

## 2021-05-31 DIAGNOSIS — Z91010 Allergy to peanuts: Secondary | ICD-10-CM | POA: Insufficient documentation

## 2021-05-31 DIAGNOSIS — R0602 Shortness of breath: Secondary | ICD-10-CM | POA: Diagnosis not present

## 2021-05-31 DIAGNOSIS — E875 Hyperkalemia: Secondary | ICD-10-CM | POA: Diagnosis not present

## 2021-05-31 DIAGNOSIS — D631 Anemia in chronic kidney disease: Secondary | ICD-10-CM | POA: Diagnosis not present

## 2021-05-31 DIAGNOSIS — N2581 Secondary hyperparathyroidism of renal origin: Secondary | ICD-10-CM | POA: Diagnosis not present

## 2021-05-31 DIAGNOSIS — I120 Hypertensive chronic kidney disease with stage 5 chronic kidney disease or end stage renal disease: Secondary | ICD-10-CM | POA: Diagnosis not present

## 2021-05-31 DIAGNOSIS — Z20822 Contact with and (suspected) exposure to covid-19: Secondary | ICD-10-CM | POA: Insufficient documentation

## 2021-05-31 DIAGNOSIS — R059 Cough, unspecified: Secondary | ICD-10-CM | POA: Diagnosis not present

## 2021-05-31 LAB — CBC
HCT: 33.8 % — ABNORMAL LOW (ref 36.0–46.0)
Hemoglobin: 10.6 g/dL — ABNORMAL LOW (ref 12.0–15.0)
MCH: 29.4 pg (ref 26.0–34.0)
MCHC: 31.4 g/dL (ref 30.0–36.0)
MCV: 93.6 fL (ref 80.0–100.0)
Platelets: 263 10*3/uL (ref 150–400)
RBC: 3.61 MIL/uL — ABNORMAL LOW (ref 3.87–5.11)
RDW: 18.5 % — ABNORMAL HIGH (ref 11.5–15.5)
WBC: 9.5 10*3/uL (ref 4.0–10.5)
nRBC: 0.2 % (ref 0.0–0.2)

## 2021-05-31 LAB — I-STAT BETA HCG BLOOD, ED (MC, WL, AP ONLY): I-stat hCG, quantitative: <5 m[IU]/mL (ref <5)

## 2021-05-31 LAB — BASIC METABOLIC PANEL
Anion gap: 17 — ABNORMAL HIGH (ref 5–15)
BUN: 82 mg/dL — ABNORMAL HIGH (ref 6–20)
CO2: 23 mmol/L (ref 22–32)
Calcium: 10.1 mg/dL (ref 8.9–10.3)
Chloride: 96 mmol/L — ABNORMAL LOW (ref 98–111)
Creatinine, Ser: 10.63 mg/dL — ABNORMAL HIGH (ref 0.44–1.00)
GFR, Estimated: 5 mL/min — ABNORMAL LOW (ref >60)
Glucose, Bld: 88 mg/dL (ref 70–99)
Potassium: 5.6 mmol/L — ABNORMAL HIGH (ref 3.5–5.1)
Sodium: 136 mmol/L (ref 135–145)

## 2021-05-31 LAB — RESP PANEL BY RT-PCR (FLU A&B, COVID) ARPGX2
Influenza A by PCR: NEGATIVE
Influenza B by PCR: NEGATIVE
SARS Coronavirus 2 by RT PCR: NEGATIVE

## 2021-05-31 LAB — TROPONIN I (HIGH SENSITIVITY)
Troponin I (High Sensitivity): 23 ng/L — ABNORMAL HIGH (ref <18)
Troponin I (High Sensitivity): 23 ng/L — ABNORMAL HIGH (ref <18)

## 2021-05-31 MED ORDER — SODIUM ZIRCONIUM CYCLOSILICATE 5 G PO PACK
5.0000 g | PACK | Freq: Once | ORAL | Status: AC
  Administered 2021-05-31: 5 g via ORAL
  Filled 2021-05-31: qty 1

## 2021-05-31 NOTE — ED Triage Notes (Addendum)
Patient arrives from home with complaint of left and right sided chest pain and shortness of breath that began 2 hours ago. Pt reports missing dialysis appointment today due to tooth and gum swelling that has been causing her pain since lastnight. Patient also endorses headache x 2 hours.

## 2021-05-31 NOTE — Discharge Instructions (Addendum)
Potassium was a little elevated today, lokelma will temporize this for now. Call dialysis center this AM to see if you can get in today for make-up session or even partial session. If unable to do see, contact Dr. Soundra Pilon office to see if they can help facilitate. Return to the ED immediately for any new/acute changes.

## 2021-05-31 NOTE — ED Provider Notes (Signed)
[facility] DEPT Provider Note   CSN: [id number] Arrival date & time: 05/31/21  0257     History Chief Complaint  Patient presents with   Chest Pain    [name] is a 30 y.o. female.  The history is provided by the patient and medical records.  Chest Pain  30 y.o. F with hx of anemia, caudal regression syndrome with spina bifida, depression, ESRD on HD, GERD, HTN, presenting to the ED for chest pain and some SOB.  States she is usually Tue/Thur/Sat dialysis and missed treatment today due to gingival pain she has been having.  States tonight started feeling a little SOB, but not persistent.  Mild cough without fever/chills/sweats.  Usual dialysis center is in [place], nephrologist is usually Dr. Marval Regal.  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   Depression 05/14/2015   Dialysis care    ESRD (end stage renal disease) on dialysis [facility])    GERD (gastroesophageal reflux disease) 01/07/2017   HTN (hypertension) 05/02/2011   Spina bifida    UTI (lower urinary tract infection)     Patient Active Problem List   Diagnosis Date Noted   Sepsis ([place]) 05/08/2020   Dialysis AV fistula malfunction ([place])    UTI (urinary tract infection) 04/29/2020   Acute UTI 05/07/2018   Tobacco use 05/07/2018   Hypokalemia 05/07/2018   GERD (gastroesophageal reflux disease) 01/07/2017   Nausea with vomiting 01/07/2017   Pyelonephritis 07/31/2015   Back pain 07/31/2015   Abdominal pain 07/31/2015   Constipation 07/31/2015   Major depressive disorder, single episode, severe without psychotic features ([place]) 05/15/2015   Spina bifida ([place]) 01/18/2012   ESRD (end stage renal disease) ([place]) 05/02/2011   Anemia 05/02/2011   HTN (hypertension) 05/02/2011    Past Surgical History:  Procedure Laterality Date   AV FISTULA PLACEMENT      left arm   INSERTION OF DIALYSIS CATHETER N/A 05/08/2020   Procedure: ATTEMPTED, UNSUCCESSFUL INSERTION OF DIALYSIS CATHETER TUNNELED RIGHT INTERNAL JUGULAR;  Surgeon: Virl Cagey, MD;  Location: [facility];  Service: General;  Laterality: N/A;   IR FLUORO GUIDE CV LINE RIGHT  05/09/2020   IR US GUIDE VASC ACCESS RIGHT  05/09/2020     OB History     Gravida  0   Para  0   Term  0   Preterm  0   AB  0   Living  0      SAB  0   IAB  0   Ectopic  0   Multiple  0   Live Births  0           Family History  Problem Relation Age of Onset   Kidney cancer Other    Hypertension Maternal Grandmother    Arthritis Maternal Grandmother    Breast cancer Maternal Aunt    Colon cancer Neg Hx     Social History   Tobacco Use   Smoking status: Former    Packs/day: 0.10    Types: Cigarettes   Smokeless tobacco: Never   Tobacco comments:    2 cigs a day  Vaping Use   Vaping Use: Never used  Substance Use Topics   Alcohol use: No   Drug use: No    Home Medications Prior to Admission medications   Medication Sig Start  Date End Date Taking? Authorizing Provider  acetaminophen (TYLENOL) 500 MG tablet Take 1,000 mg by mouth every 6 (six) hours as needed for mild pain or headache.    [provider]  amLODipine (NORVASC) 5 MG tablet Take 5 mg by mouth at bedtime. 04/05/20   [provider]  AURYXIA 1 GM 210 MG(Fe) tablet Take 2 tablets by mouth 3 (three) times daily with meals. 04/15/18   [provider]  B Complex-C-Folic Acid (DIALYVITE 732) 0.8 MG TABS Take 0.8 mg by mouth daily.    [provider]  diphenhydrAMINE (BENADRYL) 25 mg capsule Take 25 mg by mouth daily as needed for allergies.    [provider]  Epoetin Alfa (EPOGEN IJ) Inject as directed See admin instructions. Every Tuesday, Thursday, Saturday    [provider]  hydrALAZINE (APRESOLINE) 50 MG tablet Take 50 mg by mouth 3 (three) times daily. 04/28/20    [provider]  metoCLOPramide (REGLAN) 5 MG tablet Take 1 tablet (5 mg total) by mouth 4 (four) times daily. Take one tablet 1-2 hours before dialysis 11/02/19   Doran Stabler, MD  metoprolol succinate (TOPROL-XL) 50 MG 24 hr tablet Take 50 mg by mouth daily. 12/29/19   [provider]  montelukast (SINGULAIR) 10 MG tablet Take 10 mg by mouth at bedtime. 04/27/20   [provider]  multivitamin (RENA-VIT) TABS tablet Take 1 tablet by mouth See admin instructions. Every Tuesday, Thursday, Saturday    [provider]  omeprazole (PRILOSEC) 20 MG capsule Take 20 mg by mouth daily.  03/26/16   [provider]  ondansetron (ZOFRAN) 4 MG tablet Take 1 tablet (4 mg total) by mouth every 6 (six) hours as needed for nausea or vomiting. 04/04/21   Marcello Fennel, PA-C    Allergies    Ciprofloxacin; Other; Peanut-containing drug products; Aleve [naproxen sodium]; Ceftriaxone; Coconut oil; Influenza vaccines; Tetanus toxoid, adsorbed; Tetanus toxoids; and Latex  Review of Systems   Review of Systems  Cardiovascular:  Positive for chest pain.  All other systems reviewed and are negative.  Physical Exam Updated Vital Signs BP (!) 147/93 (BP Location: Right Arm)   Pulse 94   Temp 99 F (37.2 C) (Oral)   Resp 20   SpO2 99%   Physical Exam Vitals and nursing note reviewed.  Constitutional:      Appearance: She is well-developed.  HENT:     Head: Normocephalic and atraumatic.  Eyes:     Conjunctiva/sclera: Conjunctivae normal.     Pupils: Pupils are equal, round, and reactive to light.  Cardiovascular:     Rate and Rhythm: Normal rate and regular rhythm.     Heart sounds: Normal heart sounds.  Pulmonary:     Effort: Pulmonary effort is normal.     Breath sounds: Normal breath sounds. No wheezing or rhonchi.  Abdominal:     General: Bowel sounds are normal.     Palpations: Abdomen is soft.  Musculoskeletal:        General: Normal range of  motion.     Cervical back: Normal range of motion.     Comments: Fistula LUE, thrill present Atrophic lower extremities  Skin:    General: Skin is warm and dry.  Neurological:     Mental Status: She is alert and oriented to person, place, and time.    ED Results / Procedures / Treatments   Labs (all labs ordered are listed, but only abnormal results are displayed)  Labs Reviewed  BASIC METABOLIC PANEL - Abnormal; Notable for the following components:      Result Value   Potassium 5.6 (*)    Chloride 96 (*)    BUN 82 (*)    Creatinine, Ser 10.63 (*)    GFR, Estimated 5 (*)    Anion gap 17 (*)    All other components within normal limits  CBC - Abnormal; Notable for the following components:   RBC 3.61 (*)    Hemoglobin 10.6 (*)    HCT 33.8 (*)    RDW 18.5 (*)    All other components within normal limits  TROPONIN I (HIGH SENSITIVITY) - Abnormal; Notable for the following components:   Troponin I (High Sensitivity) 23 (*)    All other components within normal limits  TROPONIN I (HIGH SENSITIVITY) - Abnormal; Notable for the following components:   Troponin I (High Sensitivity) 23 (*)    All other components within normal limits  RESP PANEL BY RT-PCR (FLU A&B, COVID) ARPGX2  I-STAT BETA HCG BLOOD, ED ([facility], [facility], [facility] ONLY)    EKG EKG Interpretation  Date/Time:  Friday May 31 2021 03:18:22 EDT Ventricular Rate:  94 PR Interval:  130 QRS Duration: 76 QT Interval:  344 QTC Calculation: 431 R Axis:   36 Text Interpretation: Sinus rhythm Confirmed by Quintella Reichert [phone]) on 05/31/2021 3:53:54 AM  Radiology DG Chest 2 View  Result Date: 05/31/2021 CLINICAL DATA:  Chest pain EXAM: CHEST - 2 VIEW COMPARISON:  04/04/2021 FINDINGS: The heart size and mediastinal contours are within normal limits. Both lungs are clear. The visualized skeletal structures are unremarkable. IMPRESSION: No active cardiopulmonary disease. Electronically Signed   By: Ulyses Jarred M.D.   On:  05/31/2021 03:51    Procedures Procedures   Medications Ordered in ED Medications - No data to display  ED Course  I have reviewed the triage vital signs and the nursing notes.  Pertinent labs & imaging results that were available during my care of the patient were reviewed by me and considered in my medical decision making (see chart for details).    MDM Rules/Calculators/A&P                           30 year old female presenting to the ED with chest pain and shortness of breath.  Missed her dialysis today as she was having gingival pain.  She denies any symptoms currently.  Her EKG is nonischemic.  Labs with mildly elevated potassium of 5.6.  Troponin is 23 which is likely secondary to her renal disease.  Lower suspicion for ACS.  Will give dose of Lokelma and repeat troponin.  She is not hypoxic and not displaying any signs of respiratory distress at present.  Delta trop unchanged.   She remains without any chest pain or shortness of breath here and vitals are stable on room air.  At this point, I do not feel she requires admission for emergent dialysis, father is calling her dialysis center now to see if she can get in today for a make-up session or even partial session.  I have given them copies of labs from today for review at dialysis center.  If they are unable to get an appointment, I have asked him to call nephology office directly to see if they can facilitate/give further instructions.  Patient and father are in agreement and comfortable with care plan.  Will return here for new/acute changes.  Case  discussed with attending physician, Dr. Ralene Bathe, who agrees with plan of care.  Final Clinical Impression(s) / ED Diagnoses Final diagnoses:  Chest pain, unspecified type  Hyperkalemia    Rx / DC Orders ED Discharge Orders     None        Larene Pickett, PA-C 05/31/21 6203    Quintella Reichert, MD 05/31/21 [phone]

## 2021-06-01 DIAGNOSIS — D631 Anemia in chronic kidney disease: Secondary | ICD-10-CM | POA: Diagnosis not present

## 2021-06-01 DIAGNOSIS — Z992 Dependence on renal dialysis: Secondary | ICD-10-CM | POA: Diagnosis not present

## 2021-06-01 DIAGNOSIS — N2581 Secondary hyperparathyroidism of renal origin: Secondary | ICD-10-CM | POA: Diagnosis not present

## 2021-06-01 DIAGNOSIS — N186 End stage renal disease: Secondary | ICD-10-CM | POA: Diagnosis not present

## 2021-06-04 DIAGNOSIS — N186 End stage renal disease: Secondary | ICD-10-CM | POA: Diagnosis not present

## 2021-06-04 DIAGNOSIS — N2581 Secondary hyperparathyroidism of renal origin: Secondary | ICD-10-CM | POA: Diagnosis not present

## 2021-06-04 DIAGNOSIS — D631 Anemia in chronic kidney disease: Secondary | ICD-10-CM | POA: Diagnosis not present

## 2021-06-04 DIAGNOSIS — Z992 Dependence on renal dialysis: Secondary | ICD-10-CM | POA: Diagnosis not present

## 2021-06-06 DIAGNOSIS — D631 Anemia in chronic kidney disease: Secondary | ICD-10-CM | POA: Diagnosis not present

## 2021-06-06 DIAGNOSIS — N186 End stage renal disease: Secondary | ICD-10-CM | POA: Diagnosis not present

## 2021-06-06 DIAGNOSIS — N2581 Secondary hyperparathyroidism of renal origin: Secondary | ICD-10-CM | POA: Diagnosis not present

## 2021-06-06 DIAGNOSIS — Z992 Dependence on renal dialysis: Secondary | ICD-10-CM | POA: Diagnosis not present

## 2021-06-08 DIAGNOSIS — D631 Anemia in chronic kidney disease: Secondary | ICD-10-CM | POA: Diagnosis not present

## 2021-06-08 DIAGNOSIS — N186 End stage renal disease: Secondary | ICD-10-CM | POA: Diagnosis not present

## 2021-06-08 DIAGNOSIS — N2581 Secondary hyperparathyroidism of renal origin: Secondary | ICD-10-CM | POA: Diagnosis not present

## 2021-06-08 DIAGNOSIS — Z992 Dependence on renal dialysis: Secondary | ICD-10-CM | POA: Diagnosis not present

## 2021-06-11 DIAGNOSIS — N186 End stage renal disease: Secondary | ICD-10-CM | POA: Diagnosis not present

## 2021-06-11 DIAGNOSIS — Z992 Dependence on renal dialysis: Secondary | ICD-10-CM | POA: Diagnosis not present

## 2021-06-11 DIAGNOSIS — N2581 Secondary hyperparathyroidism of renal origin: Secondary | ICD-10-CM | POA: Diagnosis not present

## 2021-06-11 DIAGNOSIS — D631 Anemia in chronic kidney disease: Secondary | ICD-10-CM | POA: Diagnosis not present

## 2021-06-13 DIAGNOSIS — D631 Anemia in chronic kidney disease: Secondary | ICD-10-CM | POA: Diagnosis not present

## 2021-06-13 DIAGNOSIS — Z992 Dependence on renal dialysis: Secondary | ICD-10-CM | POA: Diagnosis not present

## 2021-06-13 DIAGNOSIS — N2581 Secondary hyperparathyroidism of renal origin: Secondary | ICD-10-CM | POA: Diagnosis not present

## 2021-06-13 DIAGNOSIS — N186 End stage renal disease: Secondary | ICD-10-CM | POA: Diagnosis not present

## 2021-06-15 DIAGNOSIS — N2581 Secondary hyperparathyroidism of renal origin: Secondary | ICD-10-CM | POA: Diagnosis not present

## 2021-06-15 DIAGNOSIS — D631 Anemia in chronic kidney disease: Secondary | ICD-10-CM | POA: Diagnosis not present

## 2021-06-15 DIAGNOSIS — Z992 Dependence on renal dialysis: Secondary | ICD-10-CM | POA: Diagnosis not present

## 2021-06-15 DIAGNOSIS — N186 End stage renal disease: Secondary | ICD-10-CM | POA: Diagnosis not present

## 2021-06-18 DIAGNOSIS — N186 End stage renal disease: Secondary | ICD-10-CM | POA: Diagnosis not present

## 2021-06-18 DIAGNOSIS — Z992 Dependence on renal dialysis: Secondary | ICD-10-CM | POA: Diagnosis not present

## 2021-06-18 DIAGNOSIS — D631 Anemia in chronic kidney disease: Secondary | ICD-10-CM | POA: Diagnosis not present

## 2021-06-18 DIAGNOSIS — N2581 Secondary hyperparathyroidism of renal origin: Secondary | ICD-10-CM | POA: Diagnosis not present

## 2021-06-21 DIAGNOSIS — N2581 Secondary hyperparathyroidism of renal origin: Secondary | ICD-10-CM | POA: Diagnosis not present

## 2021-06-21 DIAGNOSIS — N186 End stage renal disease: Secondary | ICD-10-CM | POA: Diagnosis not present

## 2021-06-21 DIAGNOSIS — D631 Anemia in chronic kidney disease: Secondary | ICD-10-CM | POA: Diagnosis not present

## 2021-06-21 DIAGNOSIS — Z992 Dependence on renal dialysis: Secondary | ICD-10-CM | POA: Diagnosis not present

## 2021-06-23 DIAGNOSIS — D631 Anemia in chronic kidney disease: Secondary | ICD-10-CM | POA: Diagnosis not present

## 2021-06-23 DIAGNOSIS — N186 End stage renal disease: Secondary | ICD-10-CM | POA: Diagnosis not present

## 2021-06-23 DIAGNOSIS — Z992 Dependence on renal dialysis: Secondary | ICD-10-CM | POA: Diagnosis not present

## 2021-06-23 DIAGNOSIS — N2581 Secondary hyperparathyroidism of renal origin: Secondary | ICD-10-CM | POA: Diagnosis not present

## 2021-06-25 DIAGNOSIS — Z992 Dependence on renal dialysis: Secondary | ICD-10-CM | POA: Diagnosis not present

## 2021-06-25 DIAGNOSIS — N2581 Secondary hyperparathyroidism of renal origin: Secondary | ICD-10-CM | POA: Diagnosis not present

## 2021-06-25 DIAGNOSIS — D631 Anemia in chronic kidney disease: Secondary | ICD-10-CM | POA: Diagnosis not present

## 2021-06-25 DIAGNOSIS — N186 End stage renal disease: Secondary | ICD-10-CM | POA: Diagnosis not present

## 2021-06-26 DIAGNOSIS — D631 Anemia in chronic kidney disease: Secondary | ICD-10-CM | POA: Diagnosis not present

## 2021-06-26 DIAGNOSIS — N2581 Secondary hyperparathyroidism of renal origin: Secondary | ICD-10-CM | POA: Diagnosis not present

## 2021-06-26 DIAGNOSIS — N186 End stage renal disease: Secondary | ICD-10-CM | POA: Diagnosis not present

## 2021-06-26 DIAGNOSIS — Z992 Dependence on renal dialysis: Secondary | ICD-10-CM | POA: Diagnosis not present

## 2021-06-27 DIAGNOSIS — I120 Hypertensive chronic kidney disease with stage 5 chronic kidney disease or end stage renal disease: Secondary | ICD-10-CM | POA: Diagnosis not present

## 2021-06-27 DIAGNOSIS — Z992 Dependence on renal dialysis: Secondary | ICD-10-CM | POA: Diagnosis not present

## 2021-06-27 DIAGNOSIS — N186 End stage renal disease: Secondary | ICD-10-CM | POA: Diagnosis not present

## 2021-06-28 DIAGNOSIS — N186 End stage renal disease: Secondary | ICD-10-CM | POA: Diagnosis not present

## 2021-06-28 DIAGNOSIS — Z992 Dependence on renal dialysis: Secondary | ICD-10-CM | POA: Diagnosis not present

## 2021-07-02 DIAGNOSIS — Z992 Dependence on renal dialysis: Secondary | ICD-10-CM | POA: Diagnosis not present

## 2021-07-02 DIAGNOSIS — N186 End stage renal disease: Secondary | ICD-10-CM | POA: Diagnosis not present

## 2021-07-04 DIAGNOSIS — Z992 Dependence on renal dialysis: Secondary | ICD-10-CM | POA: Diagnosis not present

## 2021-07-04 DIAGNOSIS — N186 End stage renal disease: Secondary | ICD-10-CM | POA: Diagnosis not present

## 2021-07-09 DIAGNOSIS — D509 Iron deficiency anemia, unspecified: Secondary | ICD-10-CM | POA: Diagnosis not present

## 2021-07-09 DIAGNOSIS — D631 Anemia in chronic kidney disease: Secondary | ICD-10-CM | POA: Diagnosis not present

## 2021-07-09 DIAGNOSIS — N186 End stage renal disease: Secondary | ICD-10-CM | POA: Diagnosis not present

## 2021-07-09 DIAGNOSIS — N2581 Secondary hyperparathyroidism of renal origin: Secondary | ICD-10-CM | POA: Diagnosis not present

## 2021-07-09 DIAGNOSIS — Z992 Dependence on renal dialysis: Secondary | ICD-10-CM | POA: Diagnosis not present

## 2021-07-11 DIAGNOSIS — Z992 Dependence on renal dialysis: Secondary | ICD-10-CM | POA: Diagnosis not present

## 2021-07-11 DIAGNOSIS — D509 Iron deficiency anemia, unspecified: Secondary | ICD-10-CM | POA: Diagnosis not present

## 2021-07-11 DIAGNOSIS — N2581 Secondary hyperparathyroidism of renal origin: Secondary | ICD-10-CM | POA: Diagnosis not present

## 2021-07-11 DIAGNOSIS — N186 End stage renal disease: Secondary | ICD-10-CM | POA: Diagnosis not present

## 2021-07-11 DIAGNOSIS — D631 Anemia in chronic kidney disease: Secondary | ICD-10-CM | POA: Diagnosis not present

## 2021-07-13 DIAGNOSIS — Z992 Dependence on renal dialysis: Secondary | ICD-10-CM | POA: Diagnosis not present

## 2021-07-13 DIAGNOSIS — N186 End stage renal disease: Secondary | ICD-10-CM | POA: Diagnosis not present

## 2021-07-13 DIAGNOSIS — N2581 Secondary hyperparathyroidism of renal origin: Secondary | ICD-10-CM | POA: Diagnosis not present

## 2021-07-13 DIAGNOSIS — D509 Iron deficiency anemia, unspecified: Secondary | ICD-10-CM | POA: Diagnosis not present

## 2021-07-13 DIAGNOSIS — D631 Anemia in chronic kidney disease: Secondary | ICD-10-CM | POA: Diagnosis not present

## 2021-07-16 DIAGNOSIS — D631 Anemia in chronic kidney disease: Secondary | ICD-10-CM | POA: Diagnosis not present

## 2021-07-16 DIAGNOSIS — N2581 Secondary hyperparathyroidism of renal origin: Secondary | ICD-10-CM | POA: Diagnosis not present

## 2021-07-16 DIAGNOSIS — N186 End stage renal disease: Secondary | ICD-10-CM | POA: Diagnosis not present

## 2021-07-16 DIAGNOSIS — Z992 Dependence on renal dialysis: Secondary | ICD-10-CM | POA: Diagnosis not present

## 2021-07-16 DIAGNOSIS — D509 Iron deficiency anemia, unspecified: Secondary | ICD-10-CM | POA: Diagnosis not present

## 2021-07-18 DIAGNOSIS — N186 End stage renal disease: Secondary | ICD-10-CM | POA: Diagnosis not present

## 2021-07-18 DIAGNOSIS — Z992 Dependence on renal dialysis: Secondary | ICD-10-CM | POA: Diagnosis not present

## 2021-07-18 DIAGNOSIS — N2581 Secondary hyperparathyroidism of renal origin: Secondary | ICD-10-CM | POA: Diagnosis not present

## 2021-07-18 DIAGNOSIS — D631 Anemia in chronic kidney disease: Secondary | ICD-10-CM | POA: Diagnosis not present

## 2021-07-18 DIAGNOSIS — D509 Iron deficiency anemia, unspecified: Secondary | ICD-10-CM | POA: Diagnosis not present

## 2021-07-20 DIAGNOSIS — N2581 Secondary hyperparathyroidism of renal origin: Secondary | ICD-10-CM | POA: Diagnosis not present

## 2021-07-20 DIAGNOSIS — D509 Iron deficiency anemia, unspecified: Secondary | ICD-10-CM | POA: Diagnosis not present

## 2021-07-20 DIAGNOSIS — Z992 Dependence on renal dialysis: Secondary | ICD-10-CM | POA: Diagnosis not present

## 2021-07-20 DIAGNOSIS — D631 Anemia in chronic kidney disease: Secondary | ICD-10-CM | POA: Diagnosis not present

## 2021-07-20 DIAGNOSIS — N186 End stage renal disease: Secondary | ICD-10-CM | POA: Diagnosis not present

## 2021-07-21 DIAGNOSIS — Z20822 Contact with and (suspected) exposure to covid-19: Secondary | ICD-10-CM | POA: Diagnosis not present

## 2021-07-21 DIAGNOSIS — B349 Viral infection, unspecified: Secondary | ICD-10-CM | POA: Diagnosis not present

## 2021-07-21 DIAGNOSIS — R059 Cough, unspecified: Secondary | ICD-10-CM | POA: Diagnosis not present

## 2021-07-21 DIAGNOSIS — R918 Other nonspecific abnormal finding of lung field: Secondary | ICD-10-CM | POA: Diagnosis not present

## 2021-07-21 DIAGNOSIS — J069 Acute upper respiratory infection, unspecified: Secondary | ICD-10-CM | POA: Diagnosis not present

## 2021-07-23 DIAGNOSIS — N2581 Secondary hyperparathyroidism of renal origin: Secondary | ICD-10-CM | POA: Diagnosis not present

## 2021-07-23 DIAGNOSIS — D509 Iron deficiency anemia, unspecified: Secondary | ICD-10-CM | POA: Diagnosis not present

## 2021-07-23 DIAGNOSIS — N186 End stage renal disease: Secondary | ICD-10-CM | POA: Diagnosis not present

## 2021-07-23 DIAGNOSIS — D631 Anemia in chronic kidney disease: Secondary | ICD-10-CM | POA: Diagnosis not present

## 2021-07-23 DIAGNOSIS — Z992 Dependence on renal dialysis: Secondary | ICD-10-CM | POA: Diagnosis not present

## 2021-07-24 DIAGNOSIS — N179 Acute kidney failure, unspecified: Secondary | ICD-10-CM | POA: Diagnosis not present

## 2021-07-24 DIAGNOSIS — Z992 Dependence on renal dialysis: Secondary | ICD-10-CM | POA: Diagnosis not present

## 2021-07-24 DIAGNOSIS — J189 Pneumonia, unspecified organism: Secondary | ICD-10-CM | POA: Diagnosis not present

## 2021-07-24 DIAGNOSIS — Q631 Lobulated, fused and horseshoe kidney: Secondary | ICD-10-CM | POA: Diagnosis not present

## 2021-07-24 DIAGNOSIS — R9431 Abnormal electrocardiogram [ECG] [EKG]: Secondary | ICD-10-CM | POA: Diagnosis not present

## 2021-07-24 DIAGNOSIS — Z881 Allergy status to other antibiotic agents status: Secondary | ICD-10-CM | POA: Diagnosis not present

## 2021-07-24 DIAGNOSIS — R918 Other nonspecific abnormal finding of lung field: Secondary | ICD-10-CM | POA: Diagnosis not present

## 2021-07-24 DIAGNOSIS — Q059 Spina bifida, unspecified: Secondary | ICD-10-CM | POA: Diagnosis not present

## 2021-07-24 DIAGNOSIS — J168 Pneumonia due to other specified infectious organisms: Secondary | ICD-10-CM | POA: Diagnosis not present

## 2021-07-24 DIAGNOSIS — R079 Chest pain, unspecified: Secondary | ICD-10-CM | POA: Diagnosis not present

## 2021-07-24 DIAGNOSIS — Z20822 Contact with and (suspected) exposure to covid-19: Secondary | ICD-10-CM | POA: Diagnosis not present

## 2021-07-24 DIAGNOSIS — Z79899 Other long term (current) drug therapy: Secondary | ICD-10-CM | POA: Diagnosis not present

## 2021-07-24 DIAGNOSIS — N186 End stage renal disease: Secondary | ICD-10-CM | POA: Diagnosis not present

## 2021-07-24 DIAGNOSIS — R509 Fever, unspecified: Secondary | ICD-10-CM | POA: Diagnosis not present

## 2021-07-24 DIAGNOSIS — Z887 Allergy status to serum and vaccine status: Secondary | ICD-10-CM | POA: Diagnosis not present

## 2021-07-24 DIAGNOSIS — I120 Hypertensive chronic kidney disease with stage 5 chronic kidney disease or end stage renal disease: Secondary | ICD-10-CM | POA: Diagnosis not present

## 2021-07-24 DIAGNOSIS — Z888 Allergy status to other drugs, medicaments and biological substances status: Secondary | ICD-10-CM | POA: Diagnosis not present

## 2021-07-25 DIAGNOSIS — J189 Pneumonia, unspecified organism: Secondary | ICD-10-CM | POA: Diagnosis not present

## 2021-07-26 DIAGNOSIS — D509 Iron deficiency anemia, unspecified: Secondary | ICD-10-CM | POA: Diagnosis not present

## 2021-07-26 DIAGNOSIS — N2581 Secondary hyperparathyroidism of renal origin: Secondary | ICD-10-CM | POA: Diagnosis not present

## 2021-07-26 DIAGNOSIS — D631 Anemia in chronic kidney disease: Secondary | ICD-10-CM | POA: Diagnosis not present

## 2021-07-26 DIAGNOSIS — N186 End stage renal disease: Secondary | ICD-10-CM | POA: Diagnosis not present

## 2021-07-26 DIAGNOSIS — Z992 Dependence on renal dialysis: Secondary | ICD-10-CM | POA: Diagnosis not present

## 2021-07-27 DIAGNOSIS — D509 Iron deficiency anemia, unspecified: Secondary | ICD-10-CM | POA: Diagnosis not present

## 2021-07-27 DIAGNOSIS — Z992 Dependence on renal dialysis: Secondary | ICD-10-CM | POA: Diagnosis not present

## 2021-07-27 DIAGNOSIS — D631 Anemia in chronic kidney disease: Secondary | ICD-10-CM | POA: Diagnosis not present

## 2021-07-27 DIAGNOSIS — N2581 Secondary hyperparathyroidism of renal origin: Secondary | ICD-10-CM | POA: Diagnosis not present

## 2021-07-27 DIAGNOSIS — N186 End stage renal disease: Secondary | ICD-10-CM | POA: Diagnosis not present

## 2021-07-28 DIAGNOSIS — Z992 Dependence on renal dialysis: Secondary | ICD-10-CM | POA: Diagnosis not present

## 2021-07-28 DIAGNOSIS — N186 End stage renal disease: Secondary | ICD-10-CM | POA: Diagnosis not present

## 2021-07-28 DIAGNOSIS — I120 Hypertensive chronic kidney disease with stage 5 chronic kidney disease or end stage renal disease: Secondary | ICD-10-CM | POA: Diagnosis not present

## 2021-07-30 DIAGNOSIS — D631 Anemia in chronic kidney disease: Secondary | ICD-10-CM | POA: Diagnosis not present

## 2021-07-30 DIAGNOSIS — N186 End stage renal disease: Secondary | ICD-10-CM | POA: Diagnosis not present

## 2021-07-30 DIAGNOSIS — Z992 Dependence on renal dialysis: Secondary | ICD-10-CM | POA: Diagnosis not present

## 2021-07-30 DIAGNOSIS — N2581 Secondary hyperparathyroidism of renal origin: Secondary | ICD-10-CM | POA: Diagnosis not present

## 2021-08-01 DIAGNOSIS — D631 Anemia in chronic kidney disease: Secondary | ICD-10-CM | POA: Diagnosis not present

## 2021-08-01 DIAGNOSIS — Z992 Dependence on renal dialysis: Secondary | ICD-10-CM | POA: Diagnosis not present

## 2021-08-01 DIAGNOSIS — N186 End stage renal disease: Secondary | ICD-10-CM | POA: Diagnosis not present

## 2021-08-01 DIAGNOSIS — N2581 Secondary hyperparathyroidism of renal origin: Secondary | ICD-10-CM | POA: Diagnosis not present

## 2021-08-05 DIAGNOSIS — N186 End stage renal disease: Secondary | ICD-10-CM | POA: Diagnosis not present

## 2021-08-05 DIAGNOSIS — N2581 Secondary hyperparathyroidism of renal origin: Secondary | ICD-10-CM | POA: Diagnosis not present

## 2021-08-05 DIAGNOSIS — D631 Anemia in chronic kidney disease: Secondary | ICD-10-CM | POA: Diagnosis not present

## 2021-08-05 DIAGNOSIS — Z992 Dependence on renal dialysis: Secondary | ICD-10-CM | POA: Diagnosis not present

## 2021-08-06 DIAGNOSIS — N2581 Secondary hyperparathyroidism of renal origin: Secondary | ICD-10-CM | POA: Diagnosis not present

## 2021-08-06 DIAGNOSIS — Z992 Dependence on renal dialysis: Secondary | ICD-10-CM | POA: Diagnosis not present

## 2021-08-06 DIAGNOSIS — D631 Anemia in chronic kidney disease: Secondary | ICD-10-CM | POA: Diagnosis not present

## 2021-08-06 DIAGNOSIS — N186 End stage renal disease: Secondary | ICD-10-CM | POA: Diagnosis not present

## 2021-08-08 DIAGNOSIS — N186 End stage renal disease: Secondary | ICD-10-CM | POA: Diagnosis not present

## 2021-08-08 DIAGNOSIS — Z992 Dependence on renal dialysis: Secondary | ICD-10-CM | POA: Diagnosis not present

## 2021-08-08 DIAGNOSIS — N2581 Secondary hyperparathyroidism of renal origin: Secondary | ICD-10-CM | POA: Diagnosis not present

## 2021-08-08 DIAGNOSIS — D631 Anemia in chronic kidney disease: Secondary | ICD-10-CM | POA: Diagnosis not present

## 2021-08-10 DIAGNOSIS — N2581 Secondary hyperparathyroidism of renal origin: Secondary | ICD-10-CM | POA: Diagnosis not present

## 2021-08-10 DIAGNOSIS — N186 End stage renal disease: Secondary | ICD-10-CM | POA: Diagnosis not present

## 2021-08-10 DIAGNOSIS — D631 Anemia in chronic kidney disease: Secondary | ICD-10-CM | POA: Diagnosis not present

## 2021-08-10 DIAGNOSIS — Z992 Dependence on renal dialysis: Secondary | ICD-10-CM | POA: Diagnosis not present

## 2021-08-13 DIAGNOSIS — N2581 Secondary hyperparathyroidism of renal origin: Secondary | ICD-10-CM | POA: Diagnosis not present

## 2021-08-13 DIAGNOSIS — Z992 Dependence on renal dialysis: Secondary | ICD-10-CM | POA: Diagnosis not present

## 2021-08-13 DIAGNOSIS — D631 Anemia in chronic kidney disease: Secondary | ICD-10-CM | POA: Diagnosis not present

## 2021-08-13 DIAGNOSIS — N186 End stage renal disease: Secondary | ICD-10-CM | POA: Diagnosis not present

## 2021-08-15 DIAGNOSIS — E039 Hypothyroidism, unspecified: Secondary | ICD-10-CM | POA: Diagnosis not present

## 2021-08-15 DIAGNOSIS — N186 End stage renal disease: Secondary | ICD-10-CM | POA: Diagnosis not present

## 2021-08-15 DIAGNOSIS — D631 Anemia in chronic kidney disease: Secondary | ICD-10-CM | POA: Diagnosis not present

## 2021-08-15 DIAGNOSIS — N2581 Secondary hyperparathyroidism of renal origin: Secondary | ICD-10-CM | POA: Diagnosis not present

## 2021-08-15 DIAGNOSIS — Z992 Dependence on renal dialysis: Secondary | ICD-10-CM | POA: Diagnosis not present

## 2021-08-17 DIAGNOSIS — N186 End stage renal disease: Secondary | ICD-10-CM | POA: Diagnosis not present

## 2021-08-17 DIAGNOSIS — D631 Anemia in chronic kidney disease: Secondary | ICD-10-CM | POA: Diagnosis not present

## 2021-08-17 DIAGNOSIS — N2581 Secondary hyperparathyroidism of renal origin: Secondary | ICD-10-CM | POA: Diagnosis not present

## 2021-08-17 DIAGNOSIS — Z992 Dependence on renal dialysis: Secondary | ICD-10-CM | POA: Diagnosis not present

## 2021-08-20 DIAGNOSIS — N186 End stage renal disease: Secondary | ICD-10-CM | POA: Diagnosis not present

## 2021-08-20 DIAGNOSIS — Z992 Dependence on renal dialysis: Secondary | ICD-10-CM | POA: Diagnosis not present

## 2021-08-20 DIAGNOSIS — N2581 Secondary hyperparathyroidism of renal origin: Secondary | ICD-10-CM | POA: Diagnosis not present

## 2021-08-20 DIAGNOSIS — D631 Anemia in chronic kidney disease: Secondary | ICD-10-CM | POA: Diagnosis not present

## 2021-08-22 DIAGNOSIS — N186 End stage renal disease: Secondary | ICD-10-CM | POA: Diagnosis not present

## 2021-08-22 DIAGNOSIS — N2581 Secondary hyperparathyroidism of renal origin: Secondary | ICD-10-CM | POA: Diagnosis not present

## 2021-08-22 DIAGNOSIS — D631 Anemia in chronic kidney disease: Secondary | ICD-10-CM | POA: Diagnosis not present

## 2021-08-22 DIAGNOSIS — Z992 Dependence on renal dialysis: Secondary | ICD-10-CM | POA: Diagnosis not present

## 2021-08-24 DIAGNOSIS — N2581 Secondary hyperparathyroidism of renal origin: Secondary | ICD-10-CM | POA: Diagnosis not present

## 2021-08-24 DIAGNOSIS — N186 End stage renal disease: Secondary | ICD-10-CM | POA: Diagnosis not present

## 2021-08-24 DIAGNOSIS — Z992 Dependence on renal dialysis: Secondary | ICD-10-CM | POA: Diagnosis not present

## 2021-08-24 DIAGNOSIS — D631 Anemia in chronic kidney disease: Secondary | ICD-10-CM | POA: Diagnosis not present

## 2021-08-27 DIAGNOSIS — N186 End stage renal disease: Secondary | ICD-10-CM | POA: Diagnosis not present

## 2021-08-27 DIAGNOSIS — N2581 Secondary hyperparathyroidism of renal origin: Secondary | ICD-10-CM | POA: Diagnosis not present

## 2021-08-27 DIAGNOSIS — Z992 Dependence on renal dialysis: Secondary | ICD-10-CM | POA: Diagnosis not present

## 2021-08-27 DIAGNOSIS — D631 Anemia in chronic kidney disease: Secondary | ICD-10-CM | POA: Diagnosis not present

## 2021-08-28 DIAGNOSIS — N186 End stage renal disease: Secondary | ICD-10-CM | POA: Diagnosis not present

## 2021-08-28 DIAGNOSIS — Z992 Dependence on renal dialysis: Secondary | ICD-10-CM | POA: Diagnosis not present

## 2021-08-28 DIAGNOSIS — I120 Hypertensive chronic kidney disease with stage 5 chronic kidney disease or end stage renal disease: Secondary | ICD-10-CM | POA: Diagnosis not present

## 2021-08-30 DIAGNOSIS — D631 Anemia in chronic kidney disease: Secondary | ICD-10-CM | POA: Diagnosis not present

## 2021-08-30 DIAGNOSIS — N186 End stage renal disease: Secondary | ICD-10-CM | POA: Diagnosis not present

## 2021-08-30 DIAGNOSIS — Z992 Dependence on renal dialysis: Secondary | ICD-10-CM | POA: Diagnosis not present

## 2021-08-30 DIAGNOSIS — D509 Iron deficiency anemia, unspecified: Secondary | ICD-10-CM | POA: Diagnosis not present

## 2021-08-30 DIAGNOSIS — N2581 Secondary hyperparathyroidism of renal origin: Secondary | ICD-10-CM | POA: Diagnosis not present

## 2021-08-31 DIAGNOSIS — N2581 Secondary hyperparathyroidism of renal origin: Secondary | ICD-10-CM | POA: Diagnosis not present

## 2021-08-31 DIAGNOSIS — Z992 Dependence on renal dialysis: Secondary | ICD-10-CM | POA: Diagnosis not present

## 2021-08-31 DIAGNOSIS — D509 Iron deficiency anemia, unspecified: Secondary | ICD-10-CM | POA: Diagnosis not present

## 2021-08-31 DIAGNOSIS — D631 Anemia in chronic kidney disease: Secondary | ICD-10-CM | POA: Diagnosis not present

## 2021-08-31 DIAGNOSIS — N186 End stage renal disease: Secondary | ICD-10-CM | POA: Diagnosis not present

## 2021-09-01 ENCOUNTER — Emergency Department (HOSPITAL_COMMUNITY)
Admission: EM | Admit: 2021-09-01 | Discharge: 2021-09-01 | Disposition: A | Discharge status: Home / Self Care | Payer: Medicare Other | Attending: Emergency Medicine | Admitting: Emergency Medicine

## 2021-09-01 ENCOUNTER — Other Ambulatory Visit: Payer: Self-pay

## 2021-09-01 ENCOUNTER — Emergency Department (HOSPITAL_COMMUNITY): Payer: Medicare Other

## 2021-09-01 ENCOUNTER — Encounter (HOSPITAL_COMMUNITY): Payer: Self-pay

## 2021-09-01 DIAGNOSIS — Z20822 Contact with and (suspected) exposure to covid-19: Secondary | ICD-10-CM | POA: Diagnosis not present

## 2021-09-01 DIAGNOSIS — Z79899 Other long term (current) drug therapy: Secondary | ICD-10-CM | POA: Diagnosis not present

## 2021-09-01 DIAGNOSIS — I120 Hypertensive chronic kidney disease with stage 5 chronic kidney disease or end stage renal disease: Secondary | ICD-10-CM | POA: Diagnosis not present

## 2021-09-01 DIAGNOSIS — N3000 Acute cystitis without hematuria: Secondary | ICD-10-CM

## 2021-09-01 DIAGNOSIS — Z91040 Latex allergy status: Secondary | ICD-10-CM | POA: Diagnosis not present

## 2021-09-01 DIAGNOSIS — Z992 Dependence on renal dialysis: Secondary | ICD-10-CM | POA: Insufficient documentation

## 2021-09-01 DIAGNOSIS — Z91010 Allergy to peanuts: Secondary | ICD-10-CM | POA: Insufficient documentation

## 2021-09-01 DIAGNOSIS — R519 Headache, unspecified: Secondary | ICD-10-CM | POA: Diagnosis not present

## 2021-09-01 DIAGNOSIS — N186 End stage renal disease: Secondary | ICD-10-CM | POA: Diagnosis not present

## 2021-09-01 DIAGNOSIS — R6883 Chills (without fever): Secondary | ICD-10-CM | POA: Diagnosis not present

## 2021-09-01 DIAGNOSIS — R059 Cough, unspecified: Secondary | ICD-10-CM | POA: Diagnosis not present

## 2021-09-01 LAB — COMPREHENSIVE METABOLIC PANEL
ALT: 14 U/L (ref 0–44)
AST: 22 U/L (ref 15–41)
Albumin: 3.2 g/dL — ABNORMAL LOW (ref 3.5–5.0)
Alkaline Phosphatase: 46 U/L (ref 38–126)
Anion gap: 17 — ABNORMAL HIGH (ref 5–15)
BUN: 34 mg/dL — ABNORMAL HIGH (ref 6–20)
CO2: 28 mmol/L (ref 22–32)
Calcium: 10.3 mg/dL (ref 8.9–10.3)
Chloride: 93 mmol/L — ABNORMAL LOW (ref 98–111)
Creatinine, Ser: 7.27 mg/dL — ABNORMAL HIGH (ref 0.44–1.00)
GFR, Estimated: 7 mL/min — ABNORMAL LOW (ref >60)
Glucose, Bld: 87 mg/dL (ref 70–99)
Potassium: 4.4 mmol/L (ref 3.5–5.1)
Sodium: 138 mmol/L (ref 135–145)
Total Bilirubin: 0.8 mg/dL (ref 0.3–1.2)
Total Protein: 7.5 g/dL (ref 6.5–8.1)

## 2021-09-01 LAB — URINALYSIS, ROUTINE W REFLEX MICROSCOPIC
Bilirubin Urine: NEGATIVE
Glucose, UA: NEGATIVE mg/dL
Ketones, ur: NEGATIVE mg/dL
Nitrite: NEGATIVE
Protein, ur: >300 mg/dL — AB
Specific Gravity, Urine: 1.015 (ref 1.005–1.030)
pH: 8.5 — ABNORMAL HIGH (ref 5.0–8.0)

## 2021-09-01 LAB — CBC WITH DIFFERENTIAL/PLATELET
Abs Immature Granulocytes: 0.04 10*3/uL (ref 0.00–0.07)
Basophils Absolute: 0 10*3/uL (ref 0.0–0.1)
Basophils Relative: 1 %
Eosinophils Absolute: 0.2 10*3/uL (ref 0.0–0.5)
Eosinophils Relative: 5 %
HCT: 38.2 % (ref 36.0–46.0)
Hemoglobin: 11.1 g/dL — ABNORMAL LOW (ref 12.0–15.0)
Immature Granulocytes: 1 %
Lymphocytes Relative: 32 %
Lymphs Abs: 1.4 10*3/uL (ref 0.7–4.0)
MCH: 30.1 pg (ref 26.0–34.0)
MCHC: 29.1 g/dL — ABNORMAL LOW (ref 30.0–36.0)
MCV: 103.5 fL — ABNORMAL HIGH (ref 80.0–100.0)
Monocytes Absolute: 0.7 10*3/uL (ref 0.1–1.0)
Monocytes Relative: 16 %
Neutro Abs: 2 10*3/uL (ref 1.7–7.7)
Neutrophils Relative %: 45 %
Platelets: 217 10*3/uL (ref 150–400)
RBC: 3.69 MIL/uL — ABNORMAL LOW (ref 3.87–5.11)
RDW: 19.4 % — ABNORMAL HIGH (ref 11.5–15.5)
WBC: 4.3 10*3/uL (ref 4.0–10.5)
nRBC: 0.5 % — ABNORMAL HIGH (ref 0.0–0.2)

## 2021-09-01 LAB — RESP PANEL BY RT-PCR (FLU A&B, COVID) ARPGX2
Influenza A by PCR: NEGATIVE
Influenza B by PCR: NEGATIVE
SARS Coronavirus 2 by RT PCR: NEGATIVE

## 2021-09-01 LAB — URINALYSIS, MICROSCOPIC (REFLEX): WBC, UA: >50 WBC/hpf (ref 0–5)

## 2021-09-01 LAB — LIPASE, BLOOD: Lipase: 55 U/L — ABNORMAL HIGH (ref 11–51)

## 2021-09-01 LAB — LACTIC ACID, PLASMA: Lactic Acid, Venous: 1.7 mmol/L (ref 0.5–1.9)

## 2021-09-01 MED ORDER — SODIUM CHLORIDE 0.9 % IV BOLUS
500.0000 mL | Freq: Once | INTRAVENOUS | Status: AC
  Administered 2021-09-01: 500 mL via INTRAVENOUS

## 2021-09-01 MED ORDER — PROCHLORPERAZINE EDISYLATE 10 MG/2ML IJ SOLN
10.0000 mg | Freq: Once | INTRAMUSCULAR | Status: AC
  Administered 2021-09-01: 10 mg via INTRAVENOUS
  Filled 2021-09-01: qty 2

## 2021-09-01 MED ORDER — FOSFOMYCIN TROMETHAMINE 3 G PO PACK
3.0000 g | PACK | Freq: Once | ORAL | Status: AC
  Administered 2021-09-01: 3 g via ORAL
  Filled 2021-09-01: qty 3

## 2021-09-01 MED ORDER — SODIUM CHLORIDE 0.9 % IV BOLUS: 1000.0000 mL | Freq: Once | INTRAVENOUS | Status: DC

## 2021-09-01 MED ORDER — DIPHENHYDRAMINE HCL 50 MG/ML IJ SOLN
50.0000 mg | Freq: Once | INTRAMUSCULAR | Status: AC
  Administered 2021-09-01: 50 mg via INTRAVENOUS
  Filled 2021-09-01: qty 1

## 2021-09-01 NOTE — ED Provider Notes (Signed)
[facility] EMERGENCY DEPARTMENT Provider Note   CSN: [id number] Arrival date & time: 09/01/21  0701     History  Chief Complaint  Patient presents with   Headache    [name] is a 31 y.o. female.  The history is provided by the patient and medical records. No language interpreter was used.  Headache Pain location:  Generalized Quality:  Dull Radiates to:  Does not radiate Severity currently:  Unable to specify Severity at highest:  Unable to specify Onset quality:  Gradual Duration:  1 week Timing:  Constant Progression:  Waxing and waning Chronicity:  New Similar to prior headaches: yes   Context: bright light   Relieved by:  Nothing Worsened by:  Nothing Ineffective treatments:  None tried Associated symptoms: cough, nausea, photophobia and vomiting   Associated symptoms: no abdominal pain, no back pain, no blurred vision, no congestion, no diarrhea, no fatigue, no fever (last week), no focal weakness, no near-syncope, no neck pain, no neck stiffness, no numbness, no paresthesias, no seizures and no URI (recent)       Home Medications Prior to Admission medications   Medication Sig Start Date End Date Taking? Authorizing Provider  acetaminophen (TYLENOL) 500 MG tablet Take 1,000 mg by mouth every 6 (six) hours as needed for mild pain or headache.    [provider]  amLODipine (NORVASC) 5 MG tablet Take 5 mg by mouth at bedtime. 04/05/20   [provider]  AURYXIA 1 GM 210 MG(Fe) tablet Take 2 tablets by mouth 3 (three) times daily with meals. 04/15/18   [provider]  B Complex-C-Folic Acid (DIALYVITE 644) 0.8 MG TABS Take 0.8 mg by mouth daily.    [provider]  diphenhydrAMINE (BENADRYL) 25 mg capsule Take 25 mg by mouth daily as needed for allergies.    [provider]  Epoetin Alfa (EPOGEN IJ) Inject as directed See admin instructions. Every Tuesday, Thursday, Saturday    [provider]  hydrALAZINE (APRESOLINE) 50 MG tablet Take 50 mg by mouth 3 (three) times daily. 04/28/20   [provider]  metoCLOPramide (REGLAN) 5 MG tablet Take 1 tablet (5 mg total) by mouth 4 (four) times daily. Take one tablet 1-2 hours before dialysis 11/02/19   Doran Stabler, MD  metoprolol succinate (TOPROL-XL) 50 MG 24 hr tablet Take 50 mg by mouth daily. 12/29/19   [provider]  montelukast (SINGULAIR) 10 MG tablet Take 10 mg by mouth at bedtime. 04/27/20   [provider]  multivitamin (RENA-VIT) TABS tablet Take 1 tablet by mouth See admin instructions. Every Tuesday, Thursday, Saturday    [provider]  omeprazole (PRILOSEC) 20 MG capsule Take 20 mg by mouth daily.  03/26/16   [provider]  ondansetron (ZOFRAN) 4 MG tablet Take 1 tablet (4 mg total) by mouth every 6 (six) hours as needed for nausea or vomiting. 04/04/21   Marcello Fennel, PA-C      Allergies    Ciprofloxacin; Other; Peanut-containing drug products; Aleve [naproxen sodium]; Ceftriaxone; Coconut oil; Influenza vaccines; Tetanus toxoid, adsorbed; Tetanus toxoids; and Latex    Review of Systems   Review of Systems  Constitutional:  Negative for chills (last week), fatigue and fever (last week).  HENT:  Negative for congestion.   Eyes:  Positive for photophobia. Negative for blurred vision.  Respiratory:  Positive for cough. Negative for chest tightness, shortness of breath and wheezing.   Cardiovascular:  Negative  for chest pain, palpitations and near-syncope.  Gastrointestinal:  Positive for nausea and vomiting. Negative for abdominal pain, constipation and diarrhea.  Genitourinary:  Positive for decreased urine volume (darkened). Negative for dysuria.  Musculoskeletal:  Negative for back pain, neck pain and neck stiffness.  Skin:  Negative for rash and wound.  Neurological:  Positive for headaches. Negative for focal weakness, seizures, numbness and  paresthesias.  Psychiatric/Behavioral:  Negative for agitation.   All other systems reviewed and are negative.  Physical Exam Updated Vital Signs Ht 2' (0.61 m)    Wt 28 kg    BMI 75.35 kg/m  Physical Exam Vitals and nursing note reviewed.  Constitutional:      General: She is not in acute distress.    Appearance: She is well-developed. She is not ill-appearing, toxic-appearing or diaphoretic.  HENT:     Head: Normocephalic and atraumatic.     Mouth/Throat:     Mouth: Mucous membranes are dry.  Eyes:     Extraocular Movements: Extraocular movements intact.     Conjunctiva/sclera: Conjunctivae normal.     Pupils: Pupils are equal, round, and reactive to light.  Cardiovascular:     Rate and Rhythm: Normal rate and regular rhythm.     Heart sounds: No murmur heard. Pulmonary:     Effort: Pulmonary effort is normal. No respiratory distress.     Breath sounds: Normal breath sounds. No wheezing, rhonchi or rales.  Chest:     Chest wall: No tenderness.  Abdominal:     General: Abdomen is flat.     Palpations: Abdomen is soft.     Tenderness: There is no abdominal tenderness. There is no right CVA tenderness, left CVA tenderness, guarding or rebound.  Musculoskeletal:        General: No swelling or tenderness.     Cervical back: Neck supple. No tenderness.  Skin:    General: Skin is warm and dry.     Capillary Refill: Capillary refill takes less than 2 seconds.     Findings: No erythema.  Neurological:     General: No focal deficit present.     Mental Status: She is alert.  Psychiatric:        Mood and Affect: Mood normal.    ED Results / Procedures / Treatments   Labs (all labs ordered are listed, but only abnormal results are displayed) Labs Reviewed  CBC WITH DIFFERENTIAL/PLATELET - Abnormal; Notable for the following components:      Result Value   RBC 3.69 (*)    Hemoglobin 11.1 (*)    MCV 103.5 (*)    MCHC 29.1 (*)    RDW 19.4 (*)    nRBC 0.5 (*)    All other  components within normal limits  COMPREHENSIVE METABOLIC PANEL - Abnormal; Notable for the following components:   Chloride 93 (*)    BUN 34 (*)    Creatinine, Ser 7.27 (*)    Albumin 3.2 (*)    GFR, Estimated 7 (*)    Anion gap 17 (*)    All other components within normal limits  LIPASE, BLOOD - Abnormal; Notable for the following components:   Lipase 55 (*)    All other components within normal limits  URINALYSIS, ROUTINE W REFLEX MICROSCOPIC - Abnormal; Notable for the following components:   pH 8.5 (*)    Hgb urine dipstick MODERATE (*)    Protein, ur >300 (*)    Leukocytes,Ua MODERATE (*)    All other components  within normal limits  URINALYSIS, MICROSCOPIC (REFLEX) - Abnormal; Notable for the following components:   Bacteria, UA RARE (*)    All other components within normal limits  RESP PANEL BY RT-PCR (FLU A&B, COVID) ARPGX2  URINE CULTURE  LACTIC ACID, PLASMA    EKG None  Radiology DG Chest Portable 1 View  Result Date: 09/01/2021 CLINICAL DATA:  Recent chills. Cough. History of pneumonia in December. EXAM: PORTABLE CHEST 1 VIEW COMPARISON:  05/31/2021 FINDINGS: Stable cardiomediastinal contours. No pleural effusion or edema. No airspace opacities identified. No acute osseous findings. IMPRESSION: No acute cardiopulmonary abnormalities. Electronically Signed   By: Kerby Moors M.D.   On: 09/01/2021 08:42    Procedures Procedures    Medications Ordered in ED Medications  prochlorperazine (COMPAZINE) injection 10 mg (10 mg Intravenous Given 09/01/21 0806)  diphenhydrAMINE (BENADRYL) injection 50 mg (50 mg Intravenous Given 09/01/21 0805)  sodium chloride 0.9 % bolus 500 mL (0 mLs Intravenous Stopped 09/01/21 1001)  fosfomycin (MONUROL) packet 3 g (3 g Oral Given 09/01/21 1247)    ED Course/ Medical Decision Making/ A&P                           Medical Decision Making Amount and/or Complexity of Data Reviewed Labs: ordered. Radiology: ordered.  Risk Prescription  drug management.   KAEDEN DEPAZ is a 32 y.o. female with a past medical history significant for spina bifida with caudal regression syndrome, ESRD on dialysis TTS, hypertension, GERD, previous severe headaches, and previous UTI/pyelonephritis episodes who presents with headache.  According to patient, she had pneumonia back in December and has continued to have some cough with intermittent fevers and chills.  She reports her most recent fever was about a week ago and was 802 fever.  She says that over the last week or so she has developed some headaches that is "20 out of 10".  She reports nausea and vomiting with it and decreased oral intake.  She says that she missed dialysis on Thursday but shifted to Friday and yesterday so has not technically missed any treatment throughout the week.  She reports her urine is darker and more strong feeling similar to when she has had UTIs in the past.  She reports no abdominal pain, flank pain, chest pain, or back pain at this time but primarily pain in the head and upper neck.  She says she is very sensitive to loud noises and bright lights and this is similar to how she has had headaches in the past.  She reports she is seeing a neurologist next month for further management from these headaches. Patient denies any trauma and is denying any neurologic complaints at this time.  On exam, lungs clear chest nontender.  Abdomen nontender.  Back nontender.  Patient has major grip strength and normal sensation and pulses in upper extremities.  Symmetric smile.  Clear speech.  Pupils are symmetric and reactive, extract movements.  She does have photophobia on exam.  Given patient's report that these headaches are similar to prior, we will start with a headache cocktail as opposed to imaging given her lack of trauma or any other focal neurologic complaints.  However, she also says that her urinary symptoms are suggestive of recurrent UTI or pyelonephritis and this is also  triggered headaches in the past.  We will get urine studies and labs and give her some fluids given her nausea and vomiting.  She does appear slightly  dehydrated and does not appear fluid overloaded.  We we will get a chest x-ray given her reported continued cough, recent ammonia, and these recent fever she is not having.  Will check for flu and COVID also could be a trigger for her symptoms.  Anticipate reassessment after work-up to determine disposition.  Patient's work-up does show evidence of UTI which the patient was suspicious.  I spoke to pharmacy who recommended fosfomycin given her previous cultures and history.  This was given.  Her headache has nearly resolved and she is feeling much better.  Other work-up overall reassuring and she agrees with discharge home.  Patient will follow-up with PCP and her neurologist for the chronic headaches.  She no other questions or concerns and patient discharged in good condition with improvement in symptoms.        Final Clinical Impression(s) / ED Diagnoses Final diagnoses:  Bad headache  Acute cystitis without hematuria    Rx / DC Orders ED Discharge Orders     None      Clinical Impression: 1. Bad headache   2. Acute cystitis without hematuria     Disposition: Discharge  Condition: Good  I have discussed the results, Dx and Tx plan with the pt(& family if present). He/she/they expressed understanding and agree(s) with the plan. Discharge instructions discussed at great length. Strict return precautions discussed and pt &/or family have verbalized understanding of the instructions. No further questions at time of discharge.    New Prescriptions   No medications on file    Follow Up: Lucianne Lei, [address] [address] [employer] [place] [id number] [phone]     [facility] EMERGENCY DEPARTMENT [address] [id number] mc [place]       Bri Wakeman,  Gwenyth Allegra, MD 09/01/21 1300

## 2021-09-01 NOTE — ED Notes (Signed)
Pt self-cathed using straight catheter. Sterile technique used.

## 2021-09-01 NOTE — ED Notes (Signed)
Pt has had a headache for the past week with no relief. Pt states when she gets bad headaches she vomits. Boyfriend states she takes medication that causes N/V. Pt stated she has light sensitivity and some blurred vision during the past week. Currently pt states she has a throbbing headache behind her eyes rated 10/10 pain. Pt is compliant with HD Tue/Thurs/Sat. Pt stated in Dec she did have PNA with a lingering cough.

## 2021-09-01 NOTE — ED Notes (Signed)
Pt verbalizes understanding of discharge instructions. Opportunity for questions and answers were provided. Pt discharged from the ED.   ?

## 2021-09-01 NOTE — ED Notes (Signed)
Pt stated that she wants to wait for partner to park car and come back in before coming back.

## 2021-09-01 NOTE — Discharge Instructions (Signed)
Your history, exam, work-up today confirm UTI as a likely trigger of your recurrent headache.  After the headache medicines, your headache significantly improved.  After speaking with pharmacy based on your previous cultures, they recommended a one-time dose of fosfomycin to treat the UTI which you were given.  Please rest and stay hydrated and follow-up with your primary doctor.  If any symptoms change or worsen, please return emergency department.

## 2021-09-02 LAB — URINE CULTURE: Culture: >=100000 — AB

## 2021-09-03 ENCOUNTER — Telehealth: Payer: Self-pay

## 2021-09-03 DIAGNOSIS — Z992 Dependence on renal dialysis: Secondary | ICD-10-CM | POA: Diagnosis not present

## 2021-09-03 DIAGNOSIS — D631 Anemia in chronic kidney disease: Secondary | ICD-10-CM | POA: Diagnosis not present

## 2021-09-03 DIAGNOSIS — D509 Iron deficiency anemia, unspecified: Secondary | ICD-10-CM | POA: Diagnosis not present

## 2021-09-03 DIAGNOSIS — N186 End stage renal disease: Secondary | ICD-10-CM | POA: Diagnosis not present

## 2021-09-03 DIAGNOSIS — N2581 Secondary hyperparathyroidism of renal origin: Secondary | ICD-10-CM | POA: Diagnosis not present

## 2021-09-03 NOTE — Telephone Encounter (Signed)
Post ED Visit - Positive Culture Follow-up  Culture report reviewed by antimicrobial stewardship pharmacist: [place] Team [x]  Bertis Ruddy, Pharm.D. []  Heide Guile, Pharm.D., BCPS AQ-ID []  Parks Neptune, Pharm.D., BCPS []  Alycia Rossetti, Pharm.D., BCPS []  [place], Pharm.D., BCPS, AAHIVP []  Legrand Como, Pharm.D., BCPS, AAHIVP []  Salome Arnt, PharmD, BCPS []  Johnnette Gourd, PharmD, BCPS []  Hughes Better, PharmD, BCPS []  Leeroy Cha, PharmD []  Laqueta Linden, PharmD, BCPS []  Albertina Parr, PharmD  [place] Team []  Leodis Sias, PharmD []  Lindell Spar, PharmD []  Royetta Asal, PharmD []  Graylin Shiver, Rph []  Rema Fendt) Glennon Mac, PharmD []  Arlyn Dunning, PharmD []  Netta Cedars, PharmD []  Dia Sitter, PharmD []  Leone Haven, PharmD []  Gretta Arab, PharmD []  Theodis Shove, PharmD []  Peggyann Juba, PharmD []  Reuel Boom, PharmD   Positive urine culture Treated with Fosfomycin, organism sensitive to the same and no further patient follow-up is required at this time.  Glennon Hamilton 09/03/2021, 10:44 AM

## 2021-09-05 DIAGNOSIS — N186 End stage renal disease: Secondary | ICD-10-CM | POA: Diagnosis not present

## 2021-09-05 DIAGNOSIS — Z992 Dependence on renal dialysis: Secondary | ICD-10-CM | POA: Diagnosis not present

## 2021-09-05 DIAGNOSIS — D631 Anemia in chronic kidney disease: Secondary | ICD-10-CM | POA: Diagnosis not present

## 2021-09-05 DIAGNOSIS — N2581 Secondary hyperparathyroidism of renal origin: Secondary | ICD-10-CM | POA: Diagnosis not present

## 2021-09-05 DIAGNOSIS — D509 Iron deficiency anemia, unspecified: Secondary | ICD-10-CM | POA: Diagnosis not present

## 2021-09-07 DIAGNOSIS — D631 Anemia in chronic kidney disease: Secondary | ICD-10-CM | POA: Diagnosis not present

## 2021-09-07 DIAGNOSIS — Z992 Dependence on renal dialysis: Secondary | ICD-10-CM | POA: Diagnosis not present

## 2021-09-07 DIAGNOSIS — N186 End stage renal disease: Secondary | ICD-10-CM | POA: Diagnosis not present

## 2021-09-07 DIAGNOSIS — D509 Iron deficiency anemia, unspecified: Secondary | ICD-10-CM | POA: Diagnosis not present

## 2021-09-07 DIAGNOSIS — N2581 Secondary hyperparathyroidism of renal origin: Secondary | ICD-10-CM | POA: Diagnosis not present

## 2021-09-10 DIAGNOSIS — Z992 Dependence on renal dialysis: Secondary | ICD-10-CM | POA: Diagnosis not present

## 2021-09-10 DIAGNOSIS — N186 End stage renal disease: Secondary | ICD-10-CM | POA: Diagnosis not present

## 2021-09-10 DIAGNOSIS — N2581 Secondary hyperparathyroidism of renal origin: Secondary | ICD-10-CM | POA: Diagnosis not present

## 2021-09-10 DIAGNOSIS — D631 Anemia in chronic kidney disease: Secondary | ICD-10-CM | POA: Diagnosis not present

## 2021-09-10 DIAGNOSIS — D509 Iron deficiency anemia, unspecified: Secondary | ICD-10-CM | POA: Diagnosis not present

## 2021-09-12 DIAGNOSIS — D509 Iron deficiency anemia, unspecified: Secondary | ICD-10-CM | POA: Diagnosis not present

## 2021-09-12 DIAGNOSIS — N186 End stage renal disease: Secondary | ICD-10-CM | POA: Diagnosis not present

## 2021-09-12 DIAGNOSIS — Z992 Dependence on renal dialysis: Secondary | ICD-10-CM | POA: Diagnosis not present

## 2021-09-12 DIAGNOSIS — D631 Anemia in chronic kidney disease: Secondary | ICD-10-CM | POA: Diagnosis not present

## 2021-09-12 DIAGNOSIS — N2581 Secondary hyperparathyroidism of renal origin: Secondary | ICD-10-CM | POA: Diagnosis not present

## 2021-09-14 DIAGNOSIS — D509 Iron deficiency anemia, unspecified: Secondary | ICD-10-CM | POA: Diagnosis not present

## 2021-09-14 DIAGNOSIS — N186 End stage renal disease: Secondary | ICD-10-CM | POA: Diagnosis not present

## 2021-09-14 DIAGNOSIS — N2581 Secondary hyperparathyroidism of renal origin: Secondary | ICD-10-CM | POA: Diagnosis not present

## 2021-09-14 DIAGNOSIS — D631 Anemia in chronic kidney disease: Secondary | ICD-10-CM | POA: Diagnosis not present

## 2021-09-14 DIAGNOSIS — Z992 Dependence on renal dialysis: Secondary | ICD-10-CM | POA: Diagnosis not present

## 2021-09-17 DIAGNOSIS — N2581 Secondary hyperparathyroidism of renal origin: Secondary | ICD-10-CM | POA: Diagnosis not present

## 2021-09-17 DIAGNOSIS — D509 Iron deficiency anemia, unspecified: Secondary | ICD-10-CM | POA: Diagnosis not present

## 2021-09-17 DIAGNOSIS — N186 End stage renal disease: Secondary | ICD-10-CM | POA: Diagnosis not present

## 2021-09-17 DIAGNOSIS — Z992 Dependence on renal dialysis: Secondary | ICD-10-CM | POA: Diagnosis not present

## 2021-09-17 DIAGNOSIS — D631 Anemia in chronic kidney disease: Secondary | ICD-10-CM | POA: Diagnosis not present

## 2021-09-19 DIAGNOSIS — D509 Iron deficiency anemia, unspecified: Secondary | ICD-10-CM | POA: Diagnosis not present

## 2021-09-19 DIAGNOSIS — N186 End stage renal disease: Secondary | ICD-10-CM | POA: Diagnosis not present

## 2021-09-19 DIAGNOSIS — N2581 Secondary hyperparathyroidism of renal origin: Secondary | ICD-10-CM | POA: Diagnosis not present

## 2021-09-19 DIAGNOSIS — D631 Anemia in chronic kidney disease: Secondary | ICD-10-CM | POA: Diagnosis not present

## 2021-09-19 DIAGNOSIS — Z992 Dependence on renal dialysis: Secondary | ICD-10-CM | POA: Diagnosis not present

## 2021-09-21 DIAGNOSIS — D509 Iron deficiency anemia, unspecified: Secondary | ICD-10-CM | POA: Diagnosis not present

## 2021-09-21 DIAGNOSIS — Z992 Dependence on renal dialysis: Secondary | ICD-10-CM | POA: Diagnosis not present

## 2021-09-21 DIAGNOSIS — N2581 Secondary hyperparathyroidism of renal origin: Secondary | ICD-10-CM | POA: Diagnosis not present

## 2021-09-21 DIAGNOSIS — N186 End stage renal disease: Secondary | ICD-10-CM | POA: Diagnosis not present

## 2021-09-21 DIAGNOSIS — D631 Anemia in chronic kidney disease: Secondary | ICD-10-CM | POA: Diagnosis not present

## 2021-09-24 DIAGNOSIS — N186 End stage renal disease: Secondary | ICD-10-CM | POA: Diagnosis not present

## 2021-09-24 DIAGNOSIS — Z992 Dependence on renal dialysis: Secondary | ICD-10-CM | POA: Diagnosis not present

## 2021-09-24 DIAGNOSIS — D631 Anemia in chronic kidney disease: Secondary | ICD-10-CM | POA: Diagnosis not present

## 2021-09-24 DIAGNOSIS — N2581 Secondary hyperparathyroidism of renal origin: Secondary | ICD-10-CM | POA: Diagnosis not present

## 2021-09-24 DIAGNOSIS — D509 Iron deficiency anemia, unspecified: Secondary | ICD-10-CM | POA: Diagnosis not present

## 2021-09-25 DIAGNOSIS — I120 Hypertensive chronic kidney disease with stage 5 chronic kidney disease or end stage renal disease: Secondary | ICD-10-CM | POA: Diagnosis not present

## 2021-09-25 DIAGNOSIS — N186 End stage renal disease: Secondary | ICD-10-CM | POA: Diagnosis not present

## 2021-09-25 DIAGNOSIS — Z992 Dependence on renal dialysis: Secondary | ICD-10-CM | POA: Diagnosis not present

## 2021-09-26 DIAGNOSIS — Z992 Dependence on renal dialysis: Secondary | ICD-10-CM | POA: Diagnosis not present

## 2021-09-26 DIAGNOSIS — N2581 Secondary hyperparathyroidism of renal origin: Secondary | ICD-10-CM | POA: Diagnosis not present

## 2021-09-26 DIAGNOSIS — N186 End stage renal disease: Secondary | ICD-10-CM | POA: Diagnosis not present

## 2021-09-28 DIAGNOSIS — Z992 Dependence on renal dialysis: Secondary | ICD-10-CM | POA: Diagnosis not present

## 2021-09-28 DIAGNOSIS — N2581 Secondary hyperparathyroidism of renal origin: Secondary | ICD-10-CM | POA: Diagnosis not present

## 2021-09-28 DIAGNOSIS — N186 End stage renal disease: Secondary | ICD-10-CM | POA: Diagnosis not present

## 2021-10-01 DIAGNOSIS — N186 End stage renal disease: Secondary | ICD-10-CM | POA: Diagnosis not present

## 2021-10-01 DIAGNOSIS — N2581 Secondary hyperparathyroidism of renal origin: Secondary | ICD-10-CM | POA: Diagnosis not present

## 2021-10-01 DIAGNOSIS — Z992 Dependence on renal dialysis: Secondary | ICD-10-CM | POA: Diagnosis not present

## 2021-10-02 ENCOUNTER — Encounter: Payer: Self-pay | Admitting: Vascular Surgery

## 2021-10-02 ENCOUNTER — Ambulatory Visit (INDEPENDENT_AMBULATORY_CARE_PROVIDER_SITE_OTHER): Payer: Medicare Other | Admitting: Vascular Surgery

## 2021-10-02 ENCOUNTER — Other Ambulatory Visit: Payer: Self-pay

## 2021-10-02 VITALS — Wt <= 1120 oz

## 2021-10-02 DIAGNOSIS — N186 End stage renal disease: Secondary | ICD-10-CM

## 2021-10-02 DIAGNOSIS — Z992 Dependence on renal dialysis: Secondary | ICD-10-CM

## 2021-10-02 NOTE — H&P (View-Only) (Signed)
? ? [facility] [place] ? ?Patient name: LEEZA HEINER MRN: [id number] DOB: March 08, 1991 Sex: female ? 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 ?HPI: ?[name] is a 31 y.o. female here today for evaluation of her left upper arm AV fistula.  She has a longstanding history of end-stage renal disease and dialysis via a left upper arm AV fistula.  She has a history of spina bifida.  She is in a wheelchair.  She reports tenderness over the fistula and she has aneurysmal change above the elbow.  She has marked dilatation of the vein which is quite tortuous throughout her upper arm extending to her shoulder and actually tender pulsatile fistula veins even at the base of her neck.  She has no history of skin breakdown over the fistula or bleeding. ? ?Past Medical History:  ?Diagnosis Date  ? Anemia associated with chronic renal failure   ? Blood transfusion   ? Caudal regression syndrome   ? Assoc with spina bifida.  ? Depression 05/14/2015  ? Dialysis care   ? ESRD (end stage renal disease) on dialysis [facility])   ? GERD (gastroesophageal reflux disease) 01/07/2017  ? HTN (hypertension) 05/02/2011  ? Spina bifida   ? UTI (lower urinary tract infection)   ? ? ?Family History  ?Problem Relation Age of Onset  ? Kidney cancer Other   ? Hypertension Maternal Grandmother   ? Arthritis Maternal Grandmother   ? Breast cancer Maternal Aunt   ? Colon cancer Neg Hx   ? ? ?SOCIAL HISTORY: ?Social History  ? ?Tobacco Use  ? Smoking status: Former  ?  Packs/day: 0.10  ?  Types: Cigarettes  ? Smokeless tobacco: Never  ? Tobacco comments:  ?  2 cigs a day  ?Substance Use Topics  ? Alcohol use: No  ? ? ?Allergies  ?Allergen Reactions  ? Ciprofloxacin Shortness Of Breath, Nausea And Vomiting and Other (See Comments)  ?  HIGH FEVER and oral blisters   ? Other Anaphylaxis  ?  Revaclear dialzer  ? Peanut-Containing Drug Products Anaphylaxis  ? Aleve  [Naproxen Sodium] Other (See Comments)  ?  G.I.Bleed  ? Ceftriaxone Other (See Comments)  ?  Blisters in mouth ?  ? Coconut Oil Hives  ? Influenza Vaccines Nausea And Vomiting and Other (See Comments)  ?  High fever  ? Tetanus Toxoid, Adsorbed Nausea And Vomiting and Other (See Comments)  ?  HIGH FEVER, also  ? Tetanus Toxoids Nausea And Vomiting and Other (See Comments)  ?  HIGH FEVER  ? Latex Itching and Rash  ? ? ?Current Outpatient Medications  ?Medication Sig Dispense Refill  ? acetaminophen (TYLENOL) 500 MG tablet Take 1,000 mg by mouth every 6 (six) hours as needed for mild pain or headache.    ? amLODipine (NORVASC) 10 MG tablet Take 10 mg by mouth at bedtime.    ? AURYXIA 1 GM 210 MG(Fe) tablet Take 420 mg by mouth 3 (three) times daily with meals.  3  ? diphenhydrAMINE (BENADRYL) 25 mg capsule Take 25 mg by mouth daily as needed for allergies.    ? Epoetin Alfa (EPOGEN IJ) Inject as directed See admin instructions. Every Tuesday, Thursday, Saturday    ? hydrALAZINE (APRESOLINE) 50 MG tablet Take 50 mg by mouth daily.    ? LOKELMA 10 g PACK packet Take 10 g by mouth every Tuesday, Thursday, and Saturday at 6 PM.    ? metoCLOPramide (  REGLAN) 5 MG tablet Take 1 tablet (5 mg total) by mouth 4 (four) times daily. Take one tablet 1-2 hours before dialysis 45 tablet 1  ? metoprolol succinate (TOPROL-XL) 100 MG 24 hr tablet Take 100 mg by mouth daily.    ? montelukast (SINGULAIR) 10 MG tablet Take 10 mg by mouth at bedtime.    ? multivitamin (RENA-VIT) TABS tablet Take 1 tablet by mouth See admin instructions. Every Tuesday, Thursday, Saturday    ? omeprazole (PRILOSEC) 20 MG capsule Take 20 mg by mouth daily.   3  ? ondansetron (ZOFRAN) 4 MG tablet Take 1 tablet (4 mg total) by mouth every 6 (six) hours as needed for nausea or vomiting. 12 tablet 0  ? ?No current facility-administered medications for this visit.  ? ? ?REVIEW OF SYSTEMS:  ?'[X]'$  denotes positive finding, '[ ]'$  denotes negative finding ?Cardiac   Comments:  ?Chest pain or chest pressure:    ?Shortness of breath upon exertion:    ?Short of breath when lying flat:    ?Irregular heart rhythm:    ?    ?Vascular    ?Pain in calf, thigh, or hip brought on by ambulation:    ?Pain in feet at night that wakes you up from your sleep:     ?Blood clot in your veins:    ?Leg swelling:     ?    ? ? ?PHYSICAL EXAM: ?Vitals:  ? 10/02/21 1340  ?Weight: 61 lb 6.1 oz (27.8 kg)  ? ? ?GENERAL: The patient is a well-nourished female, in no acute distress. The vital signs are documented above. ?CARDIOVASCULAR: Palpable left radial pulse.  She has aneurysmal changes on the fistula above her antecubital space.  She has tenderness over this and also has tenderness over the vein as it tracks up over her upper arm and shoulder.  This area is very tortuous up over her upper arm and shoulder.  She also has a dilated tender pulsatile vein with a thrill in her mid neck. ?PULMONARY: There is good air exchange  ?MUSCULOSKELETAL: There are no major deformities or cyanosis. ?NEUROLOGIC: No focal weakness or paresthesias are detected. ?SKIN: There are no ulcers or rashes noted. ?PSYCHIATRIC: The patient has a normal affect. ? ?DATA:  ?None ? ?MEDICAL ISSUES: ?Had long discussion with the patient and her boyfriend present.  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  I have recommended a fistulogram at [facility].  I explained that she may be amenable to angioplasty should she have a stenosis.  Also explained that this may require surgical revision.  We will coordinate this on a nondialysis day.  She has dialysis currently on Tuesday, Thursday and Saturday at [facility] [place] ? ? ? ?Rosetta Posner, MD FACS ?[facility] [place] ?Office Tel [phone] ? ?Note: Portions of this report may have been transcribed using voice recognition software.  Every effort has been made to ensure accuracy; however,  inadvertent computerized transcription errors may still be present. ?

## 2021-10-02 NOTE — Progress Notes (Signed)
? ? [facility] [place] ? ?Patient name: [name] MRN: [id number] DOB: 09-03-1990 Sex: female ? 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 ?HPI: ?[name] is a 31 y.o. female here today for evaluation of her left upper arm AV fistula.  She has a longstanding history of end-stage renal disease and dialysis via a left upper arm AV fistula.  She has a history of spina bifida.  She is in a wheelchair.  She reports tenderness over the fistula and she has aneurysmal change above the elbow.  She has marked dilatation of the vein which is quite tortuous throughout her upper arm extending to her shoulder and actually tender pulsatile fistula veins even at the base of her neck.  She has no history of skin breakdown over the fistula or bleeding. ? ?Past Medical History:  ?Diagnosis Date  ? Anemia associated with chronic renal failure   ? Blood transfusion   ? Caudal regression syndrome   ? Assoc with spina bifida.  ? Depression 05/14/2015  ? Dialysis care   ? ESRD (end stage renal disease) on dialysis [facility])   ? GERD (gastroesophageal reflux disease) 01/07/2017  ? HTN (hypertension) 05/02/2011  ? Spina bifida   ? UTI (lower urinary tract infection)   ? ? ?Family History  ?Problem Relation Age of Onset  ? Kidney cancer Other   ? Hypertension Maternal Grandmother   ? Arthritis Maternal Grandmother   ? Breast cancer Maternal Aunt   ? Colon cancer Neg Hx   ? ? ?SOCIAL HISTORY: ?Social History  ? ?Tobacco Use  ? Smoking status: Former  ?  Packs/day: 0.10  ?  Types: Cigarettes  ? Smokeless tobacco: Never  ? Tobacco comments:  ?  2 cigs a day  ?Substance Use Topics  ? Alcohol use: No  ? ? ?Allergies  ?Allergen Reactions  ? Ciprofloxacin Shortness Of Breath, Nausea And Vomiting and Other (See Comments)  ?  HIGH FEVER and oral blisters   ? Other Anaphylaxis  ?  Revaclear dialzer  ? Peanut-Containing Drug Products Anaphylaxis  ? Aleve  [Naproxen Sodium] Other (See Comments)  ?  G.I.Bleed  ? Ceftriaxone Other (See Comments)  ?  Blisters in mouth ?  ? Coconut Oil Hives  ? Influenza Vaccines Nausea And Vomiting and Other (See Comments)  ?  High fever  ? Tetanus Toxoid, Adsorbed Nausea And Vomiting and Other (See Comments)  ?  HIGH FEVER, also  ? Tetanus Toxoids Nausea And Vomiting and Other (See Comments)  ?  HIGH FEVER  ? Latex Itching and Rash  ? ? ?Current Outpatient Medications  ?Medication Sig Dispense Refill  ? acetaminophen (TYLENOL) 500 MG tablet Take 1,000 mg by mouth every 6 (six) hours as needed for mild pain or headache.    ? amLODipine (NORVASC) 10 MG tablet Take 10 mg by mouth at bedtime.    ? AURYXIA 1 GM 210 MG(Fe) tablet Take 420 mg by mouth 3 (three) times daily with meals.  3  ? diphenhydrAMINE (BENADRYL) 25 mg capsule Take 25 mg by mouth daily as needed for allergies.    ? Epoetin Alfa (EPOGEN IJ) Inject as directed See admin instructions. Every Tuesday, Thursday, Saturday    ? hydrALAZINE (APRESOLINE) 50 MG tablet Take 50 mg by mouth daily.    ? LOKELMA 10 g PACK packet Take 10 g by mouth every Tuesday, Thursday, and Saturday at 6 PM.    ? metoCLOPramide (  REGLAN) 5 MG tablet Take 1 tablet (5 mg total) by mouth 4 (four) times daily. Take one tablet 1-2 hours before dialysis 45 tablet 1  ? metoprolol succinate (TOPROL-XL) 100 MG 24 hr tablet Take 100 mg by mouth daily.    ? montelukast (SINGULAIR) 10 MG tablet Take 10 mg by mouth at bedtime.    ? multivitamin (RENA-VIT) TABS tablet Take 1 tablet by mouth See admin instructions. Every Tuesday, Thursday, Saturday    ? omeprazole (PRILOSEC) 20 MG capsule Take 20 mg by mouth daily.   3  ? ondansetron (ZOFRAN) 4 MG tablet Take 1 tablet (4 mg total) by mouth every 6 (six) hours as needed for nausea or vomiting. 12 tablet 0  ? ?No current facility-administered medications for this visit.  ? ? ?REVIEW OF SYSTEMS:  ?'[X]'$  denotes positive finding, '[ ]'$  denotes negative finding ?Cardiac   Comments:  ?Chest pain or chest pressure:    ?Shortness of breath upon exertion:    ?Short of breath when lying flat:    ?Irregular heart rhythm:    ?    ?Vascular    ?Pain in calf, thigh, or hip brought on by ambulation:    ?Pain in feet at night that wakes you up from your sleep:     ?Blood clot in your veins:    ?Leg swelling:     ?    ? ? ?PHYSICAL EXAM: ?Vitals:  ? 10/02/21 1340  ?Weight: 61 lb 6.1 oz (27.8 kg)  ? ? ?GENERAL: The patient is a well-nourished female, in no acute distress. The vital signs are documented above. ?CARDIOVASCULAR: Palpable left radial pulse.  She has aneurysmal changes on the fistula above her antecubital space.  She has tenderness over this and also has tenderness over the vein as it tracks up over her upper arm and shoulder.  This area is very tortuous up over her upper arm and shoulder.  She also has a dilated tender pulsatile vein with a thrill in her mid neck. ?PULMONARY: There is good air exchange  ?MUSCULOSKELETAL: There are no major deformities or cyanosis. ?NEUROLOGIC: No focal weakness or paresthesias are detected. ?SKIN: There are no ulcers or rashes noted. ?PSYCHIATRIC: The patient has a normal affect. ? ?DATA:  ?None ? ?MEDICAL ISSUES: ?Had long discussion with the patient and her boyfriend present.  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  I have recommended a fistulogram at [facility].  I explained that she may be amenable to angioplasty should she have a stenosis.  Also explained that this may require surgical revision.  We will coordinate this on a nondialysis day.  She has dialysis currently on Tuesday, Thursday and Saturday at [facility] [place] ? ? ? ?Rosetta Posner, MD FACS ?[facility] [place] ?Office Tel [phone] ? ?Note: Portions of this report may have been transcribed using voice recognition software.  Every effort has been made to ensure accuracy; however,  inadvertent computerized transcription errors may still be present. ?

## 2021-10-03 DIAGNOSIS — N186 End stage renal disease: Secondary | ICD-10-CM | POA: Diagnosis not present

## 2021-10-03 DIAGNOSIS — Z992 Dependence on renal dialysis: Secondary | ICD-10-CM | POA: Diagnosis not present

## 2021-10-03 DIAGNOSIS — N2581 Secondary hyperparathyroidism of renal origin: Secondary | ICD-10-CM | POA: Diagnosis not present

## 2021-10-05 DIAGNOSIS — N2581 Secondary hyperparathyroidism of renal origin: Secondary | ICD-10-CM | POA: Diagnosis not present

## 2021-10-05 DIAGNOSIS — Z992 Dependence on renal dialysis: Secondary | ICD-10-CM | POA: Diagnosis not present

## 2021-10-05 DIAGNOSIS — N186 End stage renal disease: Secondary | ICD-10-CM | POA: Diagnosis not present

## 2021-10-07 ENCOUNTER — Other Ambulatory Visit: Payer: Self-pay

## 2021-10-08 DIAGNOSIS — N2581 Secondary hyperparathyroidism of renal origin: Secondary | ICD-10-CM | POA: Diagnosis not present

## 2021-10-08 DIAGNOSIS — N186 End stage renal disease: Secondary | ICD-10-CM | POA: Diagnosis not present

## 2021-10-08 DIAGNOSIS — Z992 Dependence on renal dialysis: Secondary | ICD-10-CM | POA: Diagnosis not present

## 2021-10-10 DIAGNOSIS — N186 End stage renal disease: Secondary | ICD-10-CM | POA: Diagnosis not present

## 2021-10-10 DIAGNOSIS — N2581 Secondary hyperparathyroidism of renal origin: Secondary | ICD-10-CM | POA: Diagnosis not present

## 2021-10-10 DIAGNOSIS — Z992 Dependence on renal dialysis: Secondary | ICD-10-CM | POA: Diagnosis not present

## 2021-10-12 DIAGNOSIS — Z992 Dependence on renal dialysis: Secondary | ICD-10-CM | POA: Diagnosis not present

## 2021-10-12 DIAGNOSIS — N186 End stage renal disease: Secondary | ICD-10-CM | POA: Diagnosis not present

## 2021-10-12 DIAGNOSIS — N2581 Secondary hyperparathyroidism of renal origin: Secondary | ICD-10-CM | POA: Diagnosis not present

## 2021-10-14 ENCOUNTER — Ambulatory Visit (HOSPITAL_COMMUNITY)
Admission: RE | Disposition: A | Discharge status: Home / Self Care | Payer: Self-pay | Source: Home / Self Care | Attending: Vascular Surgery

## 2021-10-14 ENCOUNTER — Encounter (HOSPITAL_COMMUNITY): Payer: Self-pay | Admitting: Vascular Surgery

## 2021-10-14 ENCOUNTER — Ambulatory Visit (HOSPITAL_COMMUNITY)
Admission: RE | Admit: 2021-10-14 | Discharge: 2021-10-14 | Disposition: A | Discharge status: Home / Self Care | Payer: Medicare Other | Attending: Vascular Surgery | Admitting: Vascular Surgery

## 2021-10-14 DIAGNOSIS — Z87891 Personal history of nicotine dependence: Secondary | ICD-10-CM | POA: Insufficient documentation

## 2021-10-14 DIAGNOSIS — I120 Hypertensive chronic kidney disease with stage 5 chronic kidney disease or end stage renal disease: Secondary | ICD-10-CM | POA: Insufficient documentation

## 2021-10-14 DIAGNOSIS — T82898A Other specified complication of vascular prosthetic devices, implants and grafts, initial encounter: Secondary | ICD-10-CM

## 2021-10-14 DIAGNOSIS — N186 End stage renal disease: Secondary | ICD-10-CM | POA: Insufficient documentation

## 2021-10-14 DIAGNOSIS — Q059 Spina bifida, unspecified: Secondary | ICD-10-CM | POA: Diagnosis not present

## 2021-10-14 DIAGNOSIS — Y841 Kidney dialysis as the cause of abnormal reaction of the patient, or of later complication, without mention of misadventure at the time of the procedure: Secondary | ICD-10-CM | POA: Diagnosis not present

## 2021-10-14 DIAGNOSIS — Z992 Dependence on renal dialysis: Secondary | ICD-10-CM | POA: Insufficient documentation

## 2021-10-14 DIAGNOSIS — Z79899 Other long term (current) drug therapy: Secondary | ICD-10-CM | POA: Diagnosis not present

## 2021-10-14 DIAGNOSIS — N185 Chronic kidney disease, stage 5: Secondary | ICD-10-CM

## 2021-10-14 HISTORY — PX: A/V FISTULAGRAM: CATH118298

## 2021-10-14 LAB — POCT I-STAT, CHEM 8
BUN: 43 mg/dL — ABNORMAL HIGH (ref 6–20)
Calcium, Ion: 1.11 mmol/L — ABNORMAL LOW (ref 1.15–1.40)
Chloride: 94 mmol/L — ABNORMAL LOW (ref 98–111)
Creatinine, Ser: 9.4 mg/dL — ABNORMAL HIGH (ref 0.44–1.00)
Glucose, Bld: 84 mg/dL (ref 70–99)
HCT: 37 % (ref 36.0–46.0)
Hemoglobin: 12.6 g/dL (ref 12.0–15.0)
Potassium: 4.3 mmol/L (ref 3.5–5.1)
Sodium: 133 mmol/L — ABNORMAL LOW (ref 135–145)
TCO2: 29 mmol/L (ref 22–32)

## 2021-10-14 LAB — HCG, SERUM, QUALITATIVE: Preg, Serum: NEGATIVE

## 2021-10-14 SURGERY — A/V FISTULAGRAM
Anesthesia: LOCAL | Laterality: Left

## 2021-10-14 MED ORDER — HEPARIN (PORCINE) IN NACL 1000-0.9 UT/500ML-% IV SOLN
INTRAVENOUS | Status: DC | PRN
Start: 2021-10-14 — End: 2021-10-14
  Administered 2021-10-14: 500 mL via INTRA_ARTERIAL

## 2021-10-14 MED ORDER — MIDAZOLAM HCL 2 MG/2ML IJ SOLN
INTRAMUSCULAR | Status: DC | PRN
  Administered 2021-10-14 (×2): .5 mg via INTRAVENOUS

## 2021-10-14 MED ORDER — LIDOCAINE HCL (PF) 1 % IJ SOLN
INTRAMUSCULAR | Status: AC
  Filled 2021-10-14: qty 30

## 2021-10-14 MED ORDER — SODIUM CHLORIDE 0.9 % IV SOLN: 250.0000 mL | INTRAVENOUS | Status: DC | PRN

## 2021-10-14 MED ORDER — MIDAZOLAM HCL 2 MG/2ML IJ SOLN
INTRAMUSCULAR | Status: AC
  Filled 2021-10-14: qty 2

## 2021-10-14 MED ORDER — HEPARIN (PORCINE) IN NACL 1000-0.9 UT/500ML-% IV SOLN
INTRAVENOUS | Status: AC
  Filled 2021-10-14: qty 1000

## 2021-10-14 MED ORDER — SODIUM CHLORIDE 0.9% FLUSH
3.0000 mL | Freq: Two times a day (BID) | INTRAVENOUS | Status: DC
Start: 2021-10-14 — End: 2021-10-14

## 2021-10-14 MED ORDER — SODIUM CHLORIDE 0.9% FLUSH: 3.0000 mL | INTRAVENOUS | Status: DC | PRN

## 2021-10-14 SURGICAL SUPPLY — 9 items

## 2021-10-14 NOTE — Interval H&P Note (Signed)
History and Physical Interval Note: ? ?10/14/2021 ?9:37 AM ? ?[name]  has presented today for surgery, with the diagnosis of instage renal.  The various methods of treatment have been discussed with the patient and family. After consideration of risks, benefits and other options for treatment, the patient has consented to  Procedure(s): ?A/V Fistulagram (Left) as a surgical intervention.  The patient's history has been reviewed, patient examined, no change in status, stable for surgery.  I have reviewed the patient's chart and labs.  Questions were answered to the patient's satisfaction.   ? ? ?Servando Snare ? ? ?

## 2021-10-14 NOTE — Op Note (Signed)
? ? ?  Patient name: [name] MRN: [id number] DOB: 04-11-1991 Sex: female ? ?10/14/2021 ?Pre-operative Diagnosis: esrd ?Post-operative diagnosis:  Same ?Surgeon:  Eda Paschal. Donzetta Matters, MD ?Procedure Performed: ?1.  Ultrasound-guided cannulation left arm AV fistula ?2.  Left upper extremity fistulogram ?3.  Moderate sedation with fentanyl and Versed for 60 minutes ? ?Indications: 31 year old female on dialysis via left arm AV fistula.  She now has pain in the left arm fistula that has been present for 10 years.  She is indicated for fistulogram with possible intervention. ? 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  Patient had significant pain with any palpation of the fistula but there is no underlying identifiable cause for this. ?  ?Procedure:  The patient was identified in the holding area and taken to room 8.  The patient was then placed supine on the table and prepped and draped in the usual sterile fashion.  A time out was called.  Ultrasound was used to evaluate the left arm AV fistula.  There is no signs 1% lidocaine cannulated micropuncture needle followed by wire and sheath.  An image saved the permanent record.  Moderate sedation was administered with fentanyl and Versed and her vital signs were monitored throughout the case.  Left upper extremity fistulogram was performed including retrograde imaging.  No intervention was undertaken.  The sheath was removed and the cannulation site suture-ligated with 4-0 Monocryl.  Patient tolerated procedure without any complication. ? ?Contrast: 25cc ? ? ?Friend Dorfman C. Donzetta Matters, MD ?[facility] ?Office: [phone] ?Pager: [phone] ? ? ?

## 2021-10-14 NOTE — Progress Notes (Signed)
Pt did well with procedure.  She states she did not take her BP meds this am.  Pt encouraged to take them as soon as she gets home ?

## 2021-10-15 ENCOUNTER — Encounter (HOSPITAL_COMMUNITY): Payer: Self-pay

## 2021-10-15 ENCOUNTER — Other Ambulatory Visit: Payer: Self-pay

## 2021-10-15 ENCOUNTER — Emergency Department (HOSPITAL_COMMUNITY)
Admission: EM | Admit: 2021-10-15 | Discharge: 2021-10-16 | Disposition: A | Discharge status: Home / Self Care | Payer: Medicare Other | Attending: Emergency Medicine | Admitting: Emergency Medicine

## 2021-10-15 DIAGNOSIS — Z20822 Contact with and (suspected) exposure to covid-19: Secondary | ICD-10-CM | POA: Insufficient documentation

## 2021-10-15 DIAGNOSIS — I120 Hypertensive chronic kidney disease with stage 5 chronic kidney disease or end stage renal disease: Secondary | ICD-10-CM | POA: Diagnosis not present

## 2021-10-15 DIAGNOSIS — N3289 Other specified disorders of bladder: Secondary | ICD-10-CM | POA: Diagnosis not present

## 2021-10-15 DIAGNOSIS — Z87891 Personal history of nicotine dependence: Secondary | ICD-10-CM | POA: Insufficient documentation

## 2021-10-15 DIAGNOSIS — N186 End stage renal disease: Secondary | ICD-10-CM | POA: Diagnosis not present

## 2021-10-15 DIAGNOSIS — N3000 Acute cystitis without hematuria: Secondary | ICD-10-CM | POA: Insufficient documentation

## 2021-10-15 DIAGNOSIS — N1330 Unspecified hydronephrosis: Secondary | ICD-10-CM | POA: Diagnosis not present

## 2021-10-15 DIAGNOSIS — Z992 Dependence on renal dialysis: Secondary | ICD-10-CM | POA: Diagnosis not present

## 2021-10-15 DIAGNOSIS — R109 Unspecified abdominal pain: Secondary | ICD-10-CM

## 2021-10-15 DIAGNOSIS — R110 Nausea: Secondary | ICD-10-CM | POA: Insufficient documentation

## 2021-10-15 DIAGNOSIS — N2581 Secondary hyperparathyroidism of renal origin: Secondary | ICD-10-CM | POA: Diagnosis not present

## 2021-10-15 DIAGNOSIS — R1032 Left lower quadrant pain: Secondary | ICD-10-CM | POA: Diagnosis not present

## 2021-10-15 LAB — CBC WITH DIFFERENTIAL/PLATELET
Abs Immature Granulocytes: 0 10*3/uL (ref 0.00–0.07)
Basophils Absolute: 0 10*3/uL (ref 0.0–0.1)
Basophils Relative: 1 %
Eosinophils Absolute: 0 10*3/uL (ref 0.0–0.5)
Eosinophils Relative: 0 %
HCT: 37.9 % (ref 36.0–46.0)
Hemoglobin: 11.9 g/dL — ABNORMAL LOW (ref 12.0–15.0)
Lymphocytes Relative: 24 %
Lymphs Abs: 1.1 10*3/uL (ref 0.7–4.0)
MCH: 30.5 pg (ref 26.0–34.0)
MCHC: 31.4 g/dL (ref 30.0–36.0)
MCV: 97.2 fL (ref 80.0–100.0)
Monocytes Absolute: 0.6 10*3/uL (ref 0.1–1.0)
Monocytes Relative: 13 %
Neutro Abs: 2.7 10*3/uL (ref 1.7–7.7)
Neutrophils Relative %: 62 %
Platelets: 209 10*3/uL (ref 150–400)
RBC: 3.9 MIL/uL (ref 3.87–5.11)
RDW: 14.8 % (ref 11.5–15.5)
WBC: 4.4 10*3/uL (ref 4.0–10.5)
nRBC: 0 % (ref 0.0–0.2)
nRBC: 0 /100 WBC

## 2021-10-15 LAB — RESP PANEL BY RT-PCR (FLU A&B, COVID) ARPGX2
Influenza A by PCR: NEGATIVE
Influenza B by PCR: NEGATIVE
SARS Coronavirus 2 by RT PCR: NEGATIVE

## 2021-10-15 LAB — COMPREHENSIVE METABOLIC PANEL
ALT: 19 U/L (ref 0–44)
AST: 18 U/L (ref 15–41)
Albumin: 3.3 g/dL — ABNORMAL LOW (ref 3.5–5.0)
Alkaline Phosphatase: 51 U/L (ref 38–126)
Anion gap: 16 — ABNORMAL HIGH (ref 5–15)
BUN: 24 mg/dL — ABNORMAL HIGH (ref 6–20)
CO2: 28 mmol/L (ref 22–32)
Calcium: 9.8 mg/dL (ref 8.9–10.3)
Chloride: 93 mmol/L — ABNORMAL LOW (ref 98–111)
Creatinine, Ser: 5.86 mg/dL — ABNORMAL HIGH (ref 0.44–1.00)
GFR, Estimated: 9 mL/min — ABNORMAL LOW (ref >60)
Glucose, Bld: 91 mg/dL (ref 70–99)
Potassium: 4.1 mmol/L (ref 3.5–5.1)
Sodium: 137 mmol/L (ref 135–145)
Total Bilirubin: 0.8 mg/dL (ref 0.3–1.2)
Total Protein: 7.6 g/dL (ref 6.5–8.1)

## 2021-10-15 LAB — LIPASE, BLOOD: Lipase: 45 U/L (ref 11–51)

## 2021-10-15 LAB — I-STAT BETA HCG BLOOD, ED (MC, WL, AP ONLY): I-stat hCG, quantitative: <5 m[IU]/mL (ref <5)

## 2021-10-15 MED ORDER — OXYCODONE-ACETAMINOPHEN 5-325 MG PO TABS
1.0000 | ORAL_TABLET | Freq: Once | ORAL | Status: AC
  Administered 2021-10-15: 1 via ORAL
  Filled 2021-10-15: qty 1

## 2021-10-15 NOTE — ED Provider Triage Note (Addendum)
Emergency Medicine Provider Triage Evaluation Note ? ?[name] , a 31 y.o. female  was evaluated in triage.  Pt complains of abdominal paint that began tonight. Patient reports she has had back pain throughout the day today bilaterally, tonight had acute onset abdominal pain that is somewhat generalized, but most prominent in the LLQ. No alleviating/aggravating factors to her pain. Had some dysuria tonight and discovered she had a fever upon ED arrival. She denies vomiting, diarrhea, or melena. ESRD on dialysis- received dialysis today.  ? ?Review of Systems  ?Per HPI ? ?Physical Exam  ?BP (!) 156/99 (BP Location: Right Arm)   Pulse 97   Temp (!) 100.8 ?F (38.2 ?C) (Oral)   Resp 15   Ht 2' (0.61 m)   Wt 27.7 kg   LMP  (LMP Unknown)   SpO2 99%   BMI 74.46 kg/m?  ?Gen:   Awake, somewhat uncomfortable appearing.    ?Resp:  Normal effort  ?Other:  LLQ abdominal TTP without peritoneal signs.  ? ?Medical Decision Making  ?Medically screening exam initiated at 10:21 PM.  Appropriate orders placed.  Armonii A Rau was informed that the remainder of the evaluation will be completed by another provider, this initial triage assessment does not replace that evaluation, and the importance of remaining in the ED until their evaluation is complete. ? ?Abdominal pain ? ?Analgesics ordered.  ?Plan for labs & CT scan for further assessment. Febrile- COVID/flu testing ordered as well, not toxic appearing at this time.  ?  ?Amaryllis Dyke, PA-C ?10/15/21 2225 ? ?  ?Amaryllis Dyke, PA-C ?10/15/21 2231 ? ?

## 2021-10-15 NOTE — ED Triage Notes (Signed)
Complaining of lower back pain and abdominal pain that started this am when she woke up. Has had nausea but no vomiting and did have a bowel movement 2 hours ago. Hurts more on the left lower side. ?

## 2021-10-16 ENCOUNTER — Emergency Department (HOSPITAL_COMMUNITY): Payer: Medicare Other

## 2021-10-16 DIAGNOSIS — N1330 Unspecified hydronephrosis: Secondary | ICD-10-CM | POA: Diagnosis not present

## 2021-10-16 DIAGNOSIS — N3000 Acute cystitis without hematuria: Secondary | ICD-10-CM | POA: Diagnosis not present

## 2021-10-16 DIAGNOSIS — N3289 Other specified disorders of bladder: Secondary | ICD-10-CM | POA: Diagnosis not present

## 2021-10-16 DIAGNOSIS — Z79899 Other long term (current) drug therapy: Secondary | ICD-10-CM | POA: Diagnosis not present

## 2021-10-16 DIAGNOSIS — G43719 Chronic migraine without aura, intractable, without status migrainosus: Secondary | ICD-10-CM | POA: Diagnosis not present

## 2021-10-16 LAB — URINALYSIS, ROUTINE W REFLEX MICROSCOPIC: WBC, UA: >50 WBC/hpf — ABNORMAL HIGH (ref 0–5)

## 2021-10-16 MED ORDER — DIPHENHYDRAMINE HCL 50 MG/ML IJ SOLN
25.0000 mg | Freq: Once | INTRAMUSCULAR | Status: AC
Start: 2021-10-16 — End: 2021-10-16
  Administered 2021-10-16: 25 mg via INTRAVENOUS
  Filled 2021-10-16: qty 1

## 2021-10-16 MED ORDER — SODIUM CHLORIDE 0.9 % IV BOLUS
1000.0000 mL | Freq: Once | INTRAVENOUS | Status: AC
  Administered 2021-10-16: 1000 mL via INTRAVENOUS

## 2021-10-16 MED ORDER — SULFAMETHOXAZOLE-TRIMETHOPRIM 400-80 MG PO TABS: 1.0000 | ORAL_TABLET | Freq: Every day | ORAL | 0 refills | Status: AC

## 2021-10-16 MED ORDER — SULFAMETHOXAZOLE-TRIMETHOPRIM 800-160 MG PO TABS: 1.0000 | ORAL_TABLET | Freq: Every day | ORAL | 0 refills | Status: DC

## 2021-10-16 MED ORDER — TETRACAINE HCL 0.5 % OP SOLN: 1.0000 [drp] | Freq: Once | OPHTHALMIC | Status: DC

## 2021-10-16 MED ORDER — SULFAMETHOXAZOLE-TRIMETHOPRIM 800-160 MG PO TABS
1.0000 | ORAL_TABLET | Freq: Once | ORAL | Status: AC
  Administered 2021-10-16: 1 via ORAL
  Filled 2021-10-16: qty 1

## 2021-10-16 MED ORDER — PROCHLORPERAZINE EDISYLATE 10 MG/2ML IJ SOLN
10.0000 mg | Freq: Once | INTRAMUSCULAR | Status: AC
  Administered 2021-10-16: 10 mg via INTRAVENOUS
  Filled 2021-10-16: qty 2

## 2021-10-16 MED ORDER — MORPHINE SULFATE (PF) 2 MG/ML IV SOLN
2.0000 mg | Freq: Once | INTRAVENOUS | Status: AC
  Administered 2021-10-16: 2 mg via INTRAVENOUS
  Filled 2021-10-16: qty 1

## 2021-10-16 MED FILL — Heparin Sod (Porcine)-NaCl IV Soln 1000 Unit/500ML-0.9%: INTRAVENOUS | Qty: 500 | Status: AC

## 2021-10-16 MED FILL — Lidocaine HCl Local Preservative Free (PF) Inj 1%: INTRAMUSCULAR | Qty: 30 | Status: AC

## 2021-10-16 NOTE — Discharge Instructions (Signed)
You were evaluated in the Emergency Department and after careful evaluation, we did not find any emergent condition requiring admission or further testing in the hospital. ? ?Your exam/testing today is overall reassuring.  Symptoms likely due to a urinary tract infection.  Please take the Bactrim antibiotic as prescribed, follow-up with your regular doctors. ? ?Please return to the Emergency Department if you experience any worsening of your condition.   Thank you for allowing [place] to be a part of your care. ?

## 2021-10-16 NOTE — ED Provider Notes (Signed)
?[place] DEPT ?[facility] Emergency Department ?Provider Note ?MRN:  [id number]  ?Arrival date & time: 10/16/21    ? ?Chief Complaint   ?Abdominal Pain ?  ?History of Present Illness   ?[name] is a 31 y.o. year-old female with a history of spina bifida presenting to the ED with chief complaint of abdominal pain. ? ?Diffuse abdominal and flank pain for the past 18 hours.  Nausea but no vomiting.  Normal bowel movements.  Hurts more on the left side.  Thinks she has another UTI.  Also endorsing fevers. ? ?Review of Systems  ?A thorough review of systems was obtained and all systems are negative except as noted in the HPI and PMH.  ? ?Patient's Health History   ? ?Past Medical History:  ?Diagnosis Date  ? Anemia associated with chronic renal failure   ? Blood transfusion   ? Caudal regression syndrome   ? Assoc with spina bifida.  ? Depression 05/14/2015  ? Dialysis care   ? ESRD (end stage renal disease) on dialysis [facility])   ? GERD (gastroesophageal reflux disease) 01/07/2017  ? HTN (hypertension) 05/02/2011  ? Spina bifida   ? UTI (lower urinary tract infection)   ?  ?Past Surgical History:  ?Procedure Laterality Date  ? A/V FISTULAGRAM Left 10/14/2021  ? Procedure: A/V Fistulagram;  Surgeon: [name], MD;  Location: [place];  Service: Cardiovascular;  Laterality: Left;  ? AV FISTULA PLACEMENT    ? left arm  ? INSERTION OF DIALYSIS CATHETER N/A 05/08/2020  ? Procedure: ATTEMPTED, UNSUCCESSFUL INSERTION OF DIALYSIS CATHETER TUNNELED RIGHT INTERNAL JUGULAR;  Surgeon: Virl Cagey, MD;  Location: [facility];  Service: General;  Laterality: N/A;  ? IR FLUORO GUIDE CV LINE RIGHT  05/09/2020  ? IR US GUIDE VASC ACCESS RIGHT  05/09/2020  ?  ?Family History  ?Problem Relation Age of Onset  ? Kidney cancer Other   ? Hypertension Maternal Grandmother   ? Arthritis Maternal Grandmother   ? Breast cancer Maternal Aunt   ? Colon cancer Neg Hx   ?  ?Social History  ? ?Socioeconomic  History  ? Marital status: Single  ?  Spouse name: Not on file  ? Number of children: Not on file  ? Years of education: Not on file  ? Highest education level: Not on file  ?Occupational History  ? Not on file  ?Tobacco Use  ? Smoking status: Former  ?  Packs/day: 0.10  ?  Types: Cigarettes  ? Smokeless tobacco: Never  ? Tobacco comments:  ?  2 cigs a day  ?Vaping Use  ? Vaping Use: Never used  ?Substance and Sexual Activity  ? Alcohol use: No  ? Drug use: No  ? Sexual activity: Never  ?Other Topics Concern  ? Not on file  ?Social History Narrative  ? Not on file  ? ?Social Determinants of Health  ? ?Financial Resource Strain: Not on file  ?Food Insecurity: Not on file  ?Transportation Needs: Not on file  ?Physical Activity: Not on file  ?Stress: Not on file  ?Social Connections: Not on file  ?Intimate Partner Violence: Not on file  ?  ? ?Physical Exam  ? ?Vitals:  ? 10/16/21 0430 10/16/21 0435  ?BP: (!) 174/103 (!) 174/103  ?Pulse: 93 93  ?Resp:  17  ?Temp:    ?SpO2: 95% 95%  ?  ?CONSTITUTIONAL: Well-appearing, NAD ?NEURO/PSYCH:  Alert and oriented x 3, no focal deficits ?EYES:  eyes equal  and reactive ?ENT/NECK:  no LAD, no JVD ?CARDIO: Regular rate, well-perfused, normal S1 and S2 ?PULM:  CTAB no wheezing or rhonchi ?GI/GU: Mild-distended, mild diffuse tenderness ?MSK/SPINE: Clyde Canterbury noted ?SKIN:  no rash, atraumatic ? ? ?*Additional and/or pertinent findings included in MDM below ? ?Diagnostic and Interventional Summary  ? ? EKG Interpretation ? ?Date/Time:    ?Ventricular Rate:    ?PR Interval:    ?QRS Duration:   ?QT Interval:    ?QTC Calculation:   ?R Axis:     ?Text Interpretation:   ?  ? ?  ? ?Labs Reviewed  ?COMPREHENSIVE METABOLIC PANEL - Abnormal; Notable for the following components:  ?    Result Value  ? Chloride 93 (*)   ? BUN 24 (*)   ? Creatinine, Ser 5.86 (*)   ? Albumin 3.3 (*)   ? GFR, Estimated 9 (*)   ? Anion gap 16 (*)   ? All other components within normal limits  ?CBC WITH  DIFFERENTIAL/PLATELET - Abnormal; Notable for the following components:  ? Hemoglobin 11.9 (*)   ? All other components within normal limits  ?URINALYSIS, ROUTINE W REFLEX MICROSCOPIC - Abnormal; Notable for the following components:  ? Color, Urine YELLOW (*)   ? APPearance TURBID (*)   ? Glucose, UA   (*)   ? Value: TEST NOT REPORTED DUE TO COLOR INTERFERENCE OF URINE PIGMENT  ? Hgb urine dipstick   (*)   ? Value: TEST NOT REPORTED DUE TO COLOR INTERFERENCE OF URINE PIGMENT  ? Bilirubin Urine   (*)   ? Value: TEST NOT REPORTED DUE TO COLOR INTERFERENCE OF URINE PIGMENT  ? Ketones, ur   (*)   ? Value: TEST NOT REPORTED DUE TO COLOR INTERFERENCE OF URINE PIGMENT  ? Protein, ur   (*)   ? Value: TEST NOT REPORTED DUE TO COLOR INTERFERENCE OF URINE PIGMENT  ? Nitrite   (*)   ? Value: TEST NOT REPORTED DUE TO COLOR INTERFERENCE OF URINE PIGMENT  ? Leukocytes,Ua   (*)   ? Value: TEST NOT REPORTED DUE TO COLOR INTERFERENCE OF URINE PIGMENT  ? WBC, UA >50 (*)   ? Bacteria, UA MANY (*)   ? All other components within normal limits  ?RESP PANEL BY RT-PCR (FLU A&B, COVID) ARPGX2  ?LIPASE, BLOOD  ?I-STAT BETA HCG BLOOD, ED ([facility], [facility], [facility] ONLY)  ?  ?CT Abdomen Pelvis Wo Contrast  ?Final Result  ?  ?  ?Medications  ?oxyCODONE-acetaminophen (PERCOCET/ROXICET) 5-325 MG per tablet 1 tablet (1 tablet Oral Given 10/15/21 2228)  ?morphine (PF) 2 MG/ML injection 2 mg (2 mg Intravenous Given 10/16/21 0250)  ?diphenhydrAMINE (BENADRYL) injection 25 mg (25 mg Intravenous Given 10/16/21 0402)  ?prochlorperazine (COMPAZINE) injection 10 mg (10 mg Intravenous Given 10/16/21 0402)  ?sodium chloride 0.9 % bolus 1,000 mL (1,000 mLs Intravenous New Bag/Given 10/16/21 0414)  ?sulfamethoxazole-trimethoprim (BACTRIM DS) 800-160 MG per tablet 1 tablet (1 tablet Oral Given 10/16/21 0403)  ?  ? ?Procedures  /  Critical Care ?Procedures ? ?ED Course and Medical Decision Making  ?Initial Impression and Ddx ?Considering kidney stone, UTI, appendicitis,  diverticulitis, work-up pending ? ?Past medical/surgical history that increases complexity of ED encounter: Spina bifida, ESRD ? ?Interpretation of Diagnostics ?I personally reviewed the laboratory assessment and my interpretation is as follows: No significant blood count or electrolyte disturbance, creatinine expectedly elevated, had dialysis today. ?   ?CT scan revealing bilateral hydronephrosis, raising concern for possible bladder outlet obstruction.  However the CT was discussed with radiology, the hydronephrosis is chronic, unchanged from prior studies. ? ?Patient Reassessment and Ultimate Disposition/Management ?Plan is to perform in and out catheterization and reassess. ? ?Patient feeling well, feeling well enough to go home.  Multiple antibiotic allergies, patient tolerates Bactrim well.  Requesting migraine cocktail but afterwards would like to go home.  Strict return precautions. ? ?Patient management required discussion with the following services or consulting groups:  None ? ?Complexity of Problems Addressed ?Acute illness or injury that poses threat of life of bodily function ? ?Additional Data Reviewed and Analyzed ?Further history obtained from: ?Further history from spouse/family member ? ?Additional Factors Impacting ED Encounter Risk ?Consideration of hospitalization ? ?Barth Kirks. Sedonia Small, MD ?[facility] Emergency Medicine ?[email or web address] ? ?Final Clinical Impressions(s) / ED Diagnoses  ? ?  ICD-10-CM   ?1. Abdominal pain, unspecified abdominal location  R10.9   ?  ?2. Acute cystitis without hematuria  N30.00   ?  ?  ?ED Discharge Orders   ? ?      Ordered  ?  sulfamethoxazole-trimethoprim (BACTRIM DS) 800-160 MG tablet  Daily,   Status:  Discontinued       ? 10/16/21 0450  ?  sulfamethoxazole-trimethoprim (BACTRIM) 400-80 MG tablet  Daily       ? 10/16/21 0453  ? ?  ?  ? ?  ?  ? ?Discharge Instructions Discussed with and Provided to Patient:  ? ? ? ?Discharge  Instructions   ? ?  ?You were evaluated in the Emergency Department and after careful evaluation, we did not find any emergent condition requiring admission or further testing in the hospital. ? ?Your exam/testing today is overall re

## 2021-10-17 DIAGNOSIS — Z992 Dependence on renal dialysis: Secondary | ICD-10-CM | POA: Diagnosis not present

## 2021-10-17 DIAGNOSIS — N186 End stage renal disease: Secondary | ICD-10-CM | POA: Diagnosis not present

## 2021-10-17 DIAGNOSIS — N2581 Secondary hyperparathyroidism of renal origin: Secondary | ICD-10-CM | POA: Diagnosis not present

## 2021-10-19 DIAGNOSIS — N186 End stage renal disease: Secondary | ICD-10-CM | POA: Diagnosis not present

## 2021-10-19 DIAGNOSIS — N2581 Secondary hyperparathyroidism of renal origin: Secondary | ICD-10-CM | POA: Diagnosis not present

## 2021-10-19 DIAGNOSIS — Z992 Dependence on renal dialysis: Secondary | ICD-10-CM | POA: Diagnosis not present

## 2021-10-23 DIAGNOSIS — R519 Headache, unspecified: Secondary | ICD-10-CM | POA: Diagnosis not present

## 2021-10-23 DIAGNOSIS — Z992 Dependence on renal dialysis: Secondary | ICD-10-CM | POA: Diagnosis not present

## 2021-10-23 DIAGNOSIS — N186 End stage renal disease: Secondary | ICD-10-CM | POA: Diagnosis not present

## 2021-10-23 DIAGNOSIS — N390 Urinary tract infection, site not specified: Secondary | ICD-10-CM | POA: Diagnosis not present

## 2021-10-23 DIAGNOSIS — N2581 Secondary hyperparathyroidism of renal origin: Secondary | ICD-10-CM | POA: Diagnosis not present

## 2021-10-23 DIAGNOSIS — I10 Essential (primary) hypertension: Secondary | ICD-10-CM | POA: Diagnosis not present

## 2021-10-23 DIAGNOSIS — Z20822 Contact with and (suspected) exposure to covid-19: Secondary | ICD-10-CM | POA: Diagnosis not present

## 2021-10-24 DIAGNOSIS — Z992 Dependence on renal dialysis: Secondary | ICD-10-CM | POA: Diagnosis not present

## 2021-10-24 DIAGNOSIS — N2581 Secondary hyperparathyroidism of renal origin: Secondary | ICD-10-CM | POA: Diagnosis not present

## 2021-10-24 DIAGNOSIS — N186 End stage renal disease: Secondary | ICD-10-CM | POA: Diagnosis not present

## 2021-10-26 DIAGNOSIS — I120 Hypertensive chronic kidney disease with stage 5 chronic kidney disease or end stage renal disease: Secondary | ICD-10-CM | POA: Diagnosis not present

## 2021-10-26 DIAGNOSIS — N186 End stage renal disease: Secondary | ICD-10-CM | POA: Diagnosis not present

## 2021-10-26 DIAGNOSIS — Z992 Dependence on renal dialysis: Secondary | ICD-10-CM | POA: Diagnosis not present

## 2021-10-26 DIAGNOSIS — N2581 Secondary hyperparathyroidism of renal origin: Secondary | ICD-10-CM | POA: Diagnosis not present

## 2021-10-28 DIAGNOSIS — G43719 Chronic migraine without aura, intractable, without status migrainosus: Secondary | ICD-10-CM | POA: Diagnosis not present

## 2021-10-29 DIAGNOSIS — N186 End stage renal disease: Secondary | ICD-10-CM | POA: Diagnosis not present

## 2021-10-29 DIAGNOSIS — Z992 Dependence on renal dialysis: Secondary | ICD-10-CM | POA: Diagnosis not present

## 2021-10-29 DIAGNOSIS — N2581 Secondary hyperparathyroidism of renal origin: Secondary | ICD-10-CM | POA: Diagnosis not present

## 2021-10-30 DIAGNOSIS — N390 Urinary tract infection, site not specified: Secondary | ICD-10-CM | POA: Diagnosis not present

## 2021-10-30 DIAGNOSIS — R519 Headache, unspecified: Secondary | ICD-10-CM | POA: Diagnosis not present

## 2021-11-02 DIAGNOSIS — Z992 Dependence on renal dialysis: Secondary | ICD-10-CM | POA: Diagnosis not present

## 2021-11-02 DIAGNOSIS — N2581 Secondary hyperparathyroidism of renal origin: Secondary | ICD-10-CM | POA: Diagnosis not present

## 2021-11-02 DIAGNOSIS — N186 End stage renal disease: Secondary | ICD-10-CM | POA: Diagnosis not present

## 2021-11-05 DIAGNOSIS — N2581 Secondary hyperparathyroidism of renal origin: Secondary | ICD-10-CM | POA: Diagnosis not present

## 2021-11-05 DIAGNOSIS — N186 End stage renal disease: Secondary | ICD-10-CM | POA: Diagnosis not present

## 2021-11-05 DIAGNOSIS — Z992 Dependence on renal dialysis: Secondary | ICD-10-CM | POA: Diagnosis not present

## 2021-11-07 DIAGNOSIS — Z992 Dependence on renal dialysis: Secondary | ICD-10-CM | POA: Diagnosis not present

## 2021-11-07 DIAGNOSIS — N186 End stage renal disease: Secondary | ICD-10-CM | POA: Diagnosis not present

## 2021-11-07 DIAGNOSIS — N2581 Secondary hyperparathyroidism of renal origin: Secondary | ICD-10-CM | POA: Diagnosis not present

## 2021-11-09 DIAGNOSIS — N2581 Secondary hyperparathyroidism of renal origin: Secondary | ICD-10-CM | POA: Diagnosis not present

## 2021-11-09 DIAGNOSIS — N186 End stage renal disease: Secondary | ICD-10-CM | POA: Diagnosis not present

## 2021-11-09 DIAGNOSIS — Z992 Dependence on renal dialysis: Secondary | ICD-10-CM | POA: Diagnosis not present

## 2021-11-12 DIAGNOSIS — N2581 Secondary hyperparathyroidism of renal origin: Secondary | ICD-10-CM | POA: Diagnosis not present

## 2021-11-12 DIAGNOSIS — N186 End stage renal disease: Secondary | ICD-10-CM | POA: Diagnosis not present

## 2021-11-12 DIAGNOSIS — Z992 Dependence on renal dialysis: Secondary | ICD-10-CM | POA: Diagnosis not present

## 2021-11-14 DIAGNOSIS — N2581 Secondary hyperparathyroidism of renal origin: Secondary | ICD-10-CM | POA: Diagnosis not present

## 2021-11-14 DIAGNOSIS — N186 End stage renal disease: Secondary | ICD-10-CM | POA: Diagnosis not present

## 2021-11-14 DIAGNOSIS — E039 Hypothyroidism, unspecified: Secondary | ICD-10-CM | POA: Diagnosis not present

## 2021-11-14 DIAGNOSIS — Z992 Dependence on renal dialysis: Secondary | ICD-10-CM | POA: Diagnosis not present

## 2021-11-16 DIAGNOSIS — N2581 Secondary hyperparathyroidism of renal origin: Secondary | ICD-10-CM | POA: Diagnosis not present

## 2021-11-16 DIAGNOSIS — N186 End stage renal disease: Secondary | ICD-10-CM | POA: Diagnosis not present

## 2021-11-16 DIAGNOSIS — Z992 Dependence on renal dialysis: Secondary | ICD-10-CM | POA: Diagnosis not present

## 2021-11-19 DIAGNOSIS — Z888 Allergy status to other drugs, medicaments and biological substances status: Secondary | ICD-10-CM | POA: Diagnosis not present

## 2021-11-19 DIAGNOSIS — R0789 Other chest pain: Secondary | ICD-10-CM | POA: Diagnosis not present

## 2021-11-19 DIAGNOSIS — J069 Acute upper respiratory infection, unspecified: Secondary | ICD-10-CM | POA: Diagnosis not present

## 2021-11-19 DIAGNOSIS — Z91010 Allergy to peanuts: Secondary | ICD-10-CM | POA: Diagnosis not present

## 2021-11-19 DIAGNOSIS — Z992 Dependence on renal dialysis: Secondary | ICD-10-CM | POA: Diagnosis not present

## 2021-11-19 DIAGNOSIS — Z20822 Contact with and (suspected) exposure to covid-19: Secondary | ICD-10-CM | POA: Diagnosis not present

## 2021-11-19 DIAGNOSIS — N186 End stage renal disease: Secondary | ICD-10-CM | POA: Diagnosis not present

## 2021-11-19 DIAGNOSIS — R059 Cough, unspecified: Secondary | ICD-10-CM | POA: Diagnosis not present

## 2021-11-19 DIAGNOSIS — Z887 Allergy status to serum and vaccine status: Secondary | ICD-10-CM | POA: Diagnosis not present

## 2021-11-19 DIAGNOSIS — N2581 Secondary hyperparathyroidism of renal origin: Secondary | ICD-10-CM | POA: Diagnosis not present

## 2021-11-19 DIAGNOSIS — Q059 Spina bifida, unspecified: Secondary | ICD-10-CM | POA: Diagnosis not present

## 2021-11-19 DIAGNOSIS — Z881 Allergy status to other antibiotic agents status: Secondary | ICD-10-CM | POA: Diagnosis not present

## 2021-11-19 DIAGNOSIS — Z79899 Other long term (current) drug therapy: Secondary | ICD-10-CM | POA: Diagnosis not present

## 2021-11-19 DIAGNOSIS — Z91040 Latex allergy status: Secondary | ICD-10-CM | POA: Diagnosis not present

## 2021-11-21 DIAGNOSIS — N186 End stage renal disease: Secondary | ICD-10-CM | POA: Diagnosis not present

## 2021-11-21 DIAGNOSIS — N2581 Secondary hyperparathyroidism of renal origin: Secondary | ICD-10-CM | POA: Diagnosis not present

## 2021-11-21 DIAGNOSIS — Z992 Dependence on renal dialysis: Secondary | ICD-10-CM | POA: Diagnosis not present

## 2021-11-23 DIAGNOSIS — N186 End stage renal disease: Secondary | ICD-10-CM | POA: Diagnosis not present

## 2021-11-23 DIAGNOSIS — Z992 Dependence on renal dialysis: Secondary | ICD-10-CM | POA: Diagnosis not present

## 2021-11-23 DIAGNOSIS — N2581 Secondary hyperparathyroidism of renal origin: Secondary | ICD-10-CM | POA: Diagnosis not present

## 2021-11-25 DIAGNOSIS — Z992 Dependence on renal dialysis: Secondary | ICD-10-CM | POA: Diagnosis not present

## 2021-11-25 DIAGNOSIS — Z20822 Contact with and (suspected) exposure to covid-19: Secondary | ICD-10-CM | POA: Diagnosis not present

## 2021-11-25 DIAGNOSIS — N186 End stage renal disease: Secondary | ICD-10-CM | POA: Diagnosis not present

## 2021-11-25 DIAGNOSIS — I120 Hypertensive chronic kidney disease with stage 5 chronic kidney disease or end stage renal disease: Secondary | ICD-10-CM | POA: Diagnosis not present

## 2021-11-26 DIAGNOSIS — H9203 Otalgia, bilateral: Secondary | ICD-10-CM | POA: Diagnosis not present

## 2021-11-26 DIAGNOSIS — L539 Erythematous condition, unspecified: Secondary | ICD-10-CM | POA: Diagnosis not present

## 2021-11-26 DIAGNOSIS — H6692 Otitis media, unspecified, left ear: Secondary | ICD-10-CM | POA: Diagnosis not present

## 2021-11-26 DIAGNOSIS — Z992 Dependence on renal dialysis: Secondary | ICD-10-CM | POA: Diagnosis not present

## 2021-11-26 DIAGNOSIS — N2581 Secondary hyperparathyroidism of renal origin: Secondary | ICD-10-CM | POA: Diagnosis not present

## 2021-11-26 DIAGNOSIS — N186 End stage renal disease: Secondary | ICD-10-CM | POA: Diagnosis not present

## 2021-11-28 DIAGNOSIS — N2581 Secondary hyperparathyroidism of renal origin: Secondary | ICD-10-CM | POA: Diagnosis not present

## 2021-11-28 DIAGNOSIS — Z992 Dependence on renal dialysis: Secondary | ICD-10-CM | POA: Diagnosis not present

## 2021-11-28 DIAGNOSIS — N186 End stage renal disease: Secondary | ICD-10-CM | POA: Diagnosis not present

## 2021-11-29 DIAGNOSIS — Z20822 Contact with and (suspected) exposure to covid-19: Secondary | ICD-10-CM | POA: Diagnosis not present

## 2021-11-30 DIAGNOSIS — N2581 Secondary hyperparathyroidism of renal origin: Secondary | ICD-10-CM | POA: Diagnosis not present

## 2021-11-30 DIAGNOSIS — N186 End stage renal disease: Secondary | ICD-10-CM | POA: Diagnosis not present

## 2021-11-30 DIAGNOSIS — Z992 Dependence on renal dialysis: Secondary | ICD-10-CM | POA: Diagnosis not present

## 2021-12-03 DIAGNOSIS — Z992 Dependence on renal dialysis: Secondary | ICD-10-CM | POA: Diagnosis not present

## 2021-12-03 DIAGNOSIS — N186 End stage renal disease: Secondary | ICD-10-CM | POA: Diagnosis not present

## 2021-12-03 DIAGNOSIS — N2581 Secondary hyperparathyroidism of renal origin: Secondary | ICD-10-CM | POA: Diagnosis not present

## 2021-12-05 DIAGNOSIS — Z992 Dependence on renal dialysis: Secondary | ICD-10-CM | POA: Diagnosis not present

## 2021-12-05 DIAGNOSIS — N186 End stage renal disease: Secondary | ICD-10-CM | POA: Diagnosis not present

## 2021-12-05 DIAGNOSIS — N2581 Secondary hyperparathyroidism of renal origin: Secondary | ICD-10-CM | POA: Diagnosis not present

## 2021-12-06 DIAGNOSIS — E8770 Fluid overload, unspecified: Secondary | ICD-10-CM | POA: Diagnosis not present

## 2021-12-06 DIAGNOSIS — N186 End stage renal disease: Secondary | ICD-10-CM | POA: Diagnosis not present

## 2021-12-06 DIAGNOSIS — Z992 Dependence on renal dialysis: Secondary | ICD-10-CM | POA: Diagnosis not present

## 2021-12-06 DIAGNOSIS — N2581 Secondary hyperparathyroidism of renal origin: Secondary | ICD-10-CM | POA: Diagnosis not present

## 2021-12-07 DIAGNOSIS — N186 End stage renal disease: Secondary | ICD-10-CM | POA: Diagnosis not present

## 2021-12-07 DIAGNOSIS — Z992 Dependence on renal dialysis: Secondary | ICD-10-CM | POA: Diagnosis not present

## 2021-12-07 DIAGNOSIS — N2581 Secondary hyperparathyroidism of renal origin: Secondary | ICD-10-CM | POA: Diagnosis not present

## 2021-12-10 DIAGNOSIS — Z992 Dependence on renal dialysis: Secondary | ICD-10-CM | POA: Diagnosis not present

## 2021-12-10 DIAGNOSIS — N2581 Secondary hyperparathyroidism of renal origin: Secondary | ICD-10-CM | POA: Diagnosis not present

## 2021-12-10 DIAGNOSIS — N186 End stage renal disease: Secondary | ICD-10-CM | POA: Diagnosis not present

## 2021-12-12 DIAGNOSIS — N2581 Secondary hyperparathyroidism of renal origin: Secondary | ICD-10-CM | POA: Diagnosis not present

## 2021-12-12 DIAGNOSIS — Z992 Dependence on renal dialysis: Secondary | ICD-10-CM | POA: Diagnosis not present

## 2021-12-12 DIAGNOSIS — N186 End stage renal disease: Secondary | ICD-10-CM | POA: Diagnosis not present

## 2021-12-14 DIAGNOSIS — Z992 Dependence on renal dialysis: Secondary | ICD-10-CM | POA: Diagnosis not present

## 2021-12-14 DIAGNOSIS — N186 End stage renal disease: Secondary | ICD-10-CM | POA: Diagnosis not present

## 2021-12-14 DIAGNOSIS — N2581 Secondary hyperparathyroidism of renal origin: Secondary | ICD-10-CM | POA: Diagnosis not present

## 2021-12-17 DIAGNOSIS — N2581 Secondary hyperparathyroidism of renal origin: Secondary | ICD-10-CM | POA: Diagnosis not present

## 2021-12-17 DIAGNOSIS — Z992 Dependence on renal dialysis: Secondary | ICD-10-CM | POA: Diagnosis not present

## 2021-12-17 DIAGNOSIS — N186 End stage renal disease: Secondary | ICD-10-CM | POA: Diagnosis not present

## 2021-12-19 DIAGNOSIS — Z992 Dependence on renal dialysis: Secondary | ICD-10-CM | POA: Diagnosis not present

## 2021-12-19 DIAGNOSIS — N186 End stage renal disease: Secondary | ICD-10-CM | POA: Diagnosis not present

## 2021-12-19 DIAGNOSIS — N2581 Secondary hyperparathyroidism of renal origin: Secondary | ICD-10-CM | POA: Diagnosis not present

## 2021-12-21 DIAGNOSIS — N186 End stage renal disease: Secondary | ICD-10-CM | POA: Diagnosis not present

## 2021-12-21 DIAGNOSIS — Z992 Dependence on renal dialysis: Secondary | ICD-10-CM | POA: Diagnosis not present

## 2021-12-21 DIAGNOSIS — N2581 Secondary hyperparathyroidism of renal origin: Secondary | ICD-10-CM | POA: Diagnosis not present

## 2021-12-24 DIAGNOSIS — Z992 Dependence on renal dialysis: Secondary | ICD-10-CM | POA: Diagnosis not present

## 2021-12-24 DIAGNOSIS — N186 End stage renal disease: Secondary | ICD-10-CM | POA: Diagnosis not present

## 2021-12-24 DIAGNOSIS — N2581 Secondary hyperparathyroidism of renal origin: Secondary | ICD-10-CM | POA: Diagnosis not present

## 2021-12-26 DIAGNOSIS — I120 Hypertensive chronic kidney disease with stage 5 chronic kidney disease or end stage renal disease: Secondary | ICD-10-CM | POA: Diagnosis not present

## 2021-12-26 DIAGNOSIS — N2581 Secondary hyperparathyroidism of renal origin: Secondary | ICD-10-CM | POA: Diagnosis not present

## 2021-12-26 DIAGNOSIS — N186 End stage renal disease: Secondary | ICD-10-CM | POA: Diagnosis not present

## 2021-12-26 DIAGNOSIS — Z992 Dependence on renal dialysis: Secondary | ICD-10-CM | POA: Diagnosis not present

## 2021-12-28 DIAGNOSIS — N2581 Secondary hyperparathyroidism of renal origin: Secondary | ICD-10-CM | POA: Diagnosis not present

## 2021-12-28 DIAGNOSIS — N186 End stage renal disease: Secondary | ICD-10-CM | POA: Diagnosis not present

## 2021-12-28 DIAGNOSIS — Z992 Dependence on renal dialysis: Secondary | ICD-10-CM | POA: Diagnosis not present

## 2021-12-30 ENCOUNTER — Other Ambulatory Visit: Payer: Self-pay | Admitting: Family Medicine

## 2021-12-30 ENCOUNTER — Ambulatory Visit
Admission: RE | Admit: 2021-12-30 | Discharge: 2021-12-30 | Disposition: A | Discharge status: Home / Self Care | Payer: Medicare Other | Source: Ambulatory Visit | Attending: Family Medicine | Admitting: Family Medicine

## 2021-12-30 DIAGNOSIS — M542 Cervicalgia: Secondary | ICD-10-CM | POA: Diagnosis not present

## 2021-12-30 DIAGNOSIS — R52 Pain, unspecified: Secondary | ICD-10-CM

## 2021-12-31 DIAGNOSIS — N186 End stage renal disease: Secondary | ICD-10-CM | POA: Diagnosis not present

## 2021-12-31 DIAGNOSIS — Z992 Dependence on renal dialysis: Secondary | ICD-10-CM | POA: Diagnosis not present

## 2021-12-31 DIAGNOSIS — N2581 Secondary hyperparathyroidism of renal origin: Secondary | ICD-10-CM | POA: Diagnosis not present

## 2022-01-02 DIAGNOSIS — N2581 Secondary hyperparathyroidism of renal origin: Secondary | ICD-10-CM | POA: Diagnosis not present

## 2022-01-02 DIAGNOSIS — Z992 Dependence on renal dialysis: Secondary | ICD-10-CM | POA: Diagnosis not present

## 2022-01-02 DIAGNOSIS — N186 End stage renal disease: Secondary | ICD-10-CM | POA: Diagnosis not present

## 2022-01-04 DIAGNOSIS — N2581 Secondary hyperparathyroidism of renal origin: Secondary | ICD-10-CM | POA: Diagnosis not present

## 2022-01-04 DIAGNOSIS — Z992 Dependence on renal dialysis: Secondary | ICD-10-CM | POA: Diagnosis not present

## 2022-01-04 DIAGNOSIS — N186 End stage renal disease: Secondary | ICD-10-CM | POA: Diagnosis not present

## 2022-01-07 DIAGNOSIS — N2581 Secondary hyperparathyroidism of renal origin: Secondary | ICD-10-CM | POA: Diagnosis not present

## 2022-01-07 DIAGNOSIS — N186 End stage renal disease: Secondary | ICD-10-CM | POA: Diagnosis not present

## 2022-01-07 DIAGNOSIS — Z992 Dependence on renal dialysis: Secondary | ICD-10-CM | POA: Diagnosis not present

## 2022-01-09 DIAGNOSIS — Z992 Dependence on renal dialysis: Secondary | ICD-10-CM | POA: Diagnosis not present

## 2022-01-09 DIAGNOSIS — N186 End stage renal disease: Secondary | ICD-10-CM | POA: Diagnosis not present

## 2022-01-09 DIAGNOSIS — N2581 Secondary hyperparathyroidism of renal origin: Secondary | ICD-10-CM | POA: Diagnosis not present

## 2022-01-11 DIAGNOSIS — N2581 Secondary hyperparathyroidism of renal origin: Secondary | ICD-10-CM | POA: Diagnosis not present

## 2022-01-11 DIAGNOSIS — Z992 Dependence on renal dialysis: Secondary | ICD-10-CM | POA: Diagnosis not present

## 2022-01-11 DIAGNOSIS — N186 End stage renal disease: Secondary | ICD-10-CM | POA: Diagnosis not present

## 2022-01-14 DIAGNOSIS — N186 End stage renal disease: Secondary | ICD-10-CM | POA: Diagnosis not present

## 2022-01-14 DIAGNOSIS — N2581 Secondary hyperparathyroidism of renal origin: Secondary | ICD-10-CM | POA: Diagnosis not present

## 2022-01-14 DIAGNOSIS — Z992 Dependence on renal dialysis: Secondary | ICD-10-CM | POA: Diagnosis not present

## 2022-01-16 DIAGNOSIS — N2581 Secondary hyperparathyroidism of renal origin: Secondary | ICD-10-CM | POA: Diagnosis not present

## 2022-01-16 DIAGNOSIS — Z992 Dependence on renal dialysis: Secondary | ICD-10-CM | POA: Diagnosis not present

## 2022-01-16 DIAGNOSIS — N186 End stage renal disease: Secondary | ICD-10-CM | POA: Diagnosis not present

## 2022-01-18 DIAGNOSIS — N2581 Secondary hyperparathyroidism of renal origin: Secondary | ICD-10-CM | POA: Diagnosis not present

## 2022-01-18 DIAGNOSIS — N186 End stage renal disease: Secondary | ICD-10-CM | POA: Diagnosis not present

## 2022-01-18 DIAGNOSIS — Z992 Dependence on renal dialysis: Secondary | ICD-10-CM | POA: Diagnosis not present

## 2022-01-21 DIAGNOSIS — N186 End stage renal disease: Secondary | ICD-10-CM | POA: Diagnosis not present

## 2022-01-21 DIAGNOSIS — N2581 Secondary hyperparathyroidism of renal origin: Secondary | ICD-10-CM | POA: Diagnosis not present

## 2022-01-21 DIAGNOSIS — Z992 Dependence on renal dialysis: Secondary | ICD-10-CM | POA: Diagnosis not present

## 2022-01-23 DIAGNOSIS — N186 End stage renal disease: Secondary | ICD-10-CM | POA: Diagnosis not present

## 2022-01-23 DIAGNOSIS — N2581 Secondary hyperparathyroidism of renal origin: Secondary | ICD-10-CM | POA: Diagnosis not present

## 2022-01-23 DIAGNOSIS — Z992 Dependence on renal dialysis: Secondary | ICD-10-CM | POA: Diagnosis not present

## 2022-01-25 DIAGNOSIS — Z992 Dependence on renal dialysis: Secondary | ICD-10-CM | POA: Diagnosis not present

## 2022-01-25 DIAGNOSIS — N2581 Secondary hyperparathyroidism of renal origin: Secondary | ICD-10-CM | POA: Diagnosis not present

## 2022-01-25 DIAGNOSIS — N186 End stage renal disease: Secondary | ICD-10-CM | POA: Diagnosis not present

## 2022-01-25 DIAGNOSIS — I120 Hypertensive chronic kidney disease with stage 5 chronic kidney disease or end stage renal disease: Secondary | ICD-10-CM | POA: Diagnosis not present

## 2022-01-28 DIAGNOSIS — Z992 Dependence on renal dialysis: Secondary | ICD-10-CM | POA: Diagnosis not present

## 2022-01-28 DIAGNOSIS — N186 End stage renal disease: Secondary | ICD-10-CM | POA: Diagnosis not present

## 2022-01-28 DIAGNOSIS — N2581 Secondary hyperparathyroidism of renal origin: Secondary | ICD-10-CM | POA: Diagnosis not present

## 2022-01-31 DIAGNOSIS — N2581 Secondary hyperparathyroidism of renal origin: Secondary | ICD-10-CM | POA: Diagnosis not present

## 2022-01-31 DIAGNOSIS — Z992 Dependence on renal dialysis: Secondary | ICD-10-CM | POA: Diagnosis not present

## 2022-01-31 DIAGNOSIS — N186 End stage renal disease: Secondary | ICD-10-CM | POA: Diagnosis not present

## 2022-02-01 DIAGNOSIS — N186 End stage renal disease: Secondary | ICD-10-CM | POA: Diagnosis not present

## 2022-02-01 DIAGNOSIS — Z992 Dependence on renal dialysis: Secondary | ICD-10-CM | POA: Diagnosis not present

## 2022-02-01 DIAGNOSIS — N2581 Secondary hyperparathyroidism of renal origin: Secondary | ICD-10-CM | POA: Diagnosis not present

## 2022-02-04 DIAGNOSIS — N186 End stage renal disease: Secondary | ICD-10-CM | POA: Diagnosis not present

## 2022-02-04 DIAGNOSIS — N2581 Secondary hyperparathyroidism of renal origin: Secondary | ICD-10-CM | POA: Diagnosis not present

## 2022-02-04 DIAGNOSIS — Z992 Dependence on renal dialysis: Secondary | ICD-10-CM | POA: Diagnosis not present

## 2022-02-06 DIAGNOSIS — Z992 Dependence on renal dialysis: Secondary | ICD-10-CM | POA: Diagnosis not present

## 2022-02-06 DIAGNOSIS — N186 End stage renal disease: Secondary | ICD-10-CM | POA: Diagnosis not present

## 2022-02-06 DIAGNOSIS — N2581 Secondary hyperparathyroidism of renal origin: Secondary | ICD-10-CM | POA: Diagnosis not present

## 2022-02-08 ENCOUNTER — Other Ambulatory Visit: Payer: Self-pay

## 2022-02-08 ENCOUNTER — Emergency Department (HOSPITAL_COMMUNITY): Payer: Medicare Other

## 2022-02-08 ENCOUNTER — Encounter (HOSPITAL_COMMUNITY): Payer: Self-pay

## 2022-02-08 ENCOUNTER — Emergency Department (HOSPITAL_COMMUNITY)
Admission: EM | Admit: 2022-02-08 | Discharge: 2022-02-08 | Disposition: A | Discharge status: Home / Self Care | Payer: Medicare Other | Attending: Emergency Medicine | Admitting: Emergency Medicine

## 2022-02-08 DIAGNOSIS — I120 Hypertensive chronic kidney disease with stage 5 chronic kidney disease or end stage renal disease: Secondary | ICD-10-CM | POA: Diagnosis not present

## 2022-02-08 DIAGNOSIS — Z91040 Latex allergy status: Secondary | ICD-10-CM | POA: Insufficient documentation

## 2022-02-08 DIAGNOSIS — R112 Nausea with vomiting, unspecified: Secondary | ICD-10-CM

## 2022-02-08 DIAGNOSIS — R519 Headache, unspecified: Secondary | ICD-10-CM | POA: Diagnosis not present

## 2022-02-08 DIAGNOSIS — N261 Atrophy of kidney (terminal): Secondary | ICD-10-CM | POA: Diagnosis not present

## 2022-02-08 DIAGNOSIS — N186 End stage renal disease: Secondary | ICD-10-CM | POA: Insufficient documentation

## 2022-02-08 DIAGNOSIS — R109 Unspecified abdominal pain: Secondary | ICD-10-CM | POA: Diagnosis present

## 2022-02-08 DIAGNOSIS — N2889 Other specified disorders of kidney and ureter: Secondary | ICD-10-CM | POA: Diagnosis not present

## 2022-02-08 DIAGNOSIS — R197 Diarrhea, unspecified: Secondary | ICD-10-CM | POA: Diagnosis not present

## 2022-02-08 DIAGNOSIS — Z79899 Other long term (current) drug therapy: Secondary | ICD-10-CM | POA: Diagnosis not present

## 2022-02-08 DIAGNOSIS — Z992 Dependence on renal dialysis: Secondary | ICD-10-CM | POA: Insufficient documentation

## 2022-02-08 DIAGNOSIS — Z91010 Allergy to peanuts: Secondary | ICD-10-CM | POA: Diagnosis not present

## 2022-02-08 DIAGNOSIS — N2581 Secondary hyperparathyroidism of renal origin: Secondary | ICD-10-CM | POA: Diagnosis not present

## 2022-02-08 LAB — CBC
HCT: 38.2 % (ref 36.0–46.0)
Hemoglobin: 12.2 g/dL (ref 12.0–15.0)
MCH: 31.2 pg (ref 26.0–34.0)
MCHC: 31.9 g/dL (ref 30.0–36.0)
MCV: 97.7 fL (ref 80.0–100.0)
Platelets: 216 10*3/uL (ref 150–400)
RBC: 3.91 MIL/uL (ref 3.87–5.11)
RDW: 15.7 % — ABNORMAL HIGH (ref 11.5–15.5)
WBC: 5.8 10*3/uL (ref 4.0–10.5)
nRBC: 0.5 % — ABNORMAL HIGH (ref 0.0–0.2)

## 2022-02-08 LAB — COMPREHENSIVE METABOLIC PANEL
ALT: 28 U/L (ref 0–44)
AST: 20 U/L (ref 15–41)
Albumin: 3.5 g/dL (ref 3.5–5.0)
Alkaline Phosphatase: 67 U/L (ref 38–126)
Anion gap: 13 (ref 5–15)
BUN: 19 mg/dL (ref 6–20)
CO2: 34 mmol/L — ABNORMAL HIGH (ref 22–32)
Calcium: 9.8 mg/dL (ref 8.9–10.3)
Chloride: 94 mmol/L — ABNORMAL LOW (ref 98–111)
Creatinine, Ser: 3.35 mg/dL — ABNORMAL HIGH (ref 0.44–1.00)
GFR, Estimated: 18 mL/min — ABNORMAL LOW (ref >60)
Glucose, Bld: 91 mg/dL (ref 70–99)
Potassium: 3.6 mmol/L (ref 3.5–5.1)
Sodium: 141 mmol/L (ref 135–145)
Total Bilirubin: 1 mg/dL (ref 0.3–1.2)
Total Protein: 7 g/dL (ref 6.5–8.1)

## 2022-02-08 LAB — URINALYSIS, ROUTINE W REFLEX MICROSCOPIC
Bacteria, UA: NONE SEEN
Bilirubin Urine: NEGATIVE
Glucose, UA: NEGATIVE mg/dL
Ketones, ur: NEGATIVE mg/dL
Nitrite: NEGATIVE
Protein, ur: 100 mg/dL — AB
RBC / HPF: >50 RBC/hpf — ABNORMAL HIGH (ref 0–5)
Specific Gravity, Urine: 1.01 (ref 1.005–1.030)
WBC, UA: >50 WBC/hpf — ABNORMAL HIGH (ref 0–5)
pH: 8 (ref 5.0–8.0)

## 2022-02-08 LAB — LIPASE, BLOOD: Lipase: 30 U/L (ref 11–51)

## 2022-02-08 MED ORDER — DIPHENHYDRAMINE HCL 50 MG/ML IJ SOLN
50.0000 mg | Freq: Once | INTRAMUSCULAR | Status: DC
  Filled 2022-02-08: qty 1

## 2022-02-08 MED ORDER — CEPHALEXIN 500 MG PO CAPS: 500.0000 mg | ORAL_CAPSULE | Freq: Two times a day (BID) | ORAL | 0 refills | Status: AC

## 2022-02-08 MED ORDER — CEPHALEXIN 500 MG PO CAPS
500.0000 mg | ORAL_CAPSULE | Freq: Once | ORAL | Status: AC
Start: 2022-02-08 — End: 2022-02-08
  Administered 2022-02-08: 500 mg via ORAL
  Filled 2022-02-08: qty 1

## 2022-02-08 MED ORDER — METOCLOPRAMIDE HCL 5 MG/ML IJ SOLN
5.0000 mg | Freq: Once | INTRAMUSCULAR | Status: AC
  Administered 2022-02-08: 5 mg via INTRAVENOUS
  Filled 2022-02-08: qty 2

## 2022-02-08 MED ORDER — DIPHENHYDRAMINE HCL 50 MG/ML IJ SOLN
25.0000 mg | Freq: Once | INTRAMUSCULAR | Status: AC
  Administered 2022-02-08: 25 mg via INTRAVENOUS

## 2022-02-08 MED ORDER — DEXAMETHASONE SODIUM PHOSPHATE 10 MG/ML IJ SOLN
10.0000 mg | Freq: Once | INTRAMUSCULAR | Status: AC
  Administered 2022-02-08: 10 mg via INTRAVENOUS
  Filled 2022-02-08: qty 1

## 2022-02-08 MED ORDER — PROCHLORPERAZINE EDISYLATE 10 MG/2ML IJ SOLN
10.0000 mg | Freq: Once | INTRAMUSCULAR | Status: DC
  Filled 2022-02-08: qty 2

## 2022-02-08 NOTE — ED Provider Notes (Signed)
[facility] EMERGENCY DEPARTMENT Provider Note   CSN: [id number] Arrival date & time: 02/08/22  1259     History  Chief Complaint  Patient presents with   Abdominal Pain    [name] is a 31 y.o. female with history of spina bifida with caudal regression syndrome, ESRD on dialysis TTS, hypertension, GERD, previous severe headaches, and previous UTI/pyelonephritis episodes who presents with headache.  Presenting today with headache/migraine of new features accompanied by N/V/D and abdominal pain.  Nauseous daily at her baseline, but states this is much more than her norm.  Usually gets headaches 2 out of 3 days on average, but these usually affect the right side of her forehead.  States this headache encompasses her entire head and has lasted for at least 5 days.  Denies vision changes, focal deficits, neck pain, fevers, chest pain, flank pain, back pain, or shortness of breath.  Vomiting and diarrhea are nonbloody.  Abdominal pain localized to the upper region and usually occurs when she has a UTI.  Dialysis successful this morning, had some vomiting during the session.    The history is provided by the patient and medical records.  Abdominal Pain      Home Medications Prior to Admission medications   Medication Sig Start Date End Date Taking? Authorizing Provider  acetaminophen (TYLENOL) 500 MG tablet Take 1,000 mg by mouth every 6 (six) hours as needed for mild pain or headache.   Yes [provider]  AURYXIA 1 GM 210 MG(Fe) tablet Take 630 mg by mouth 3 (three) times daily with meals. 04/15/18  Yes [provider]  B Complex-C-Folic Acid (DIALYVITE TABLET) TABS Take 1 tablet by mouth daily.   Yes [provider]  diphenhydrAMINE (BENADRYL) 25 mg capsule Take 25 mg by mouth daily as needed for allergies.   Yes [provider]  Doxercalciferol (HECTOROL IV) Given at dialysis 01/07/22 01/06/23 Yes [provider]  Epoetin Alfa (EPOGEN  IJ) Inject as directed See admin instructions. Every Tuesday, Thursday, Saturday   Yes [provider]  hydrALAZINE (APRESOLINE) 25 MG tablet Take 25 mg by mouth 3 (three) times daily. 04/28/20  Yes [provider]  LOPERAMIDE HCL PO Take by mouth. 04/25/21 04/24/22 Yes [provider]  metoprolol succinate (TOPROL-XL) 100 MG 24 hr tablet Take 100 mg by mouth daily. 12/29/19  Yes [provider]  omeprazole (PRILOSEC) 40 MG capsule Take 40 mg by mouth every morning. 01/08/22  Yes [provider]  ondansetron (ZOFRAN) 4 MG tablet Take 1 tablet (4 mg total) by mouth every 6 (six) hours as needed for nausea or vomiting. 04/04/21  Yes Marcello Fennel, PA-C  sodium zirconium cyclosilicate [facility]) 5 g packet Take 10 g by mouth every Monday, Wednesday, and Friday. 07/20/21  Yes [provider]      Allergies    Ciprofloxacin; Other; Peanut-containing drug products; Aleve [naproxen sodium]; Ceftriaxone; Coconut (cocos nucifera); Influenza vaccines; Tetanus toxoid, adsorbed; Tetanus toxoids; and Latex    Review of Systems   Review of Systems  Gastrointestinal:  Positive for abdominal pain.    Physical Exam Updated Vital Signs BP 122/76   Pulse 81   Temp 98.3 F (36.8 C) (Oral)   Resp 18   Ht 2' (0.61 m)   Wt 27.2 kg   SpO2 98%   BMI 73.24 kg/m  Physical Exam Vitals and nursing note reviewed.  Constitutional:      General: She is not in acute distress.  Appearance: She is not ill-appearing, toxic-appearing or diaphoretic.  HENT:     Head: Normocephalic and atraumatic.     Mouth/Throat:     Pharynx: Oropharynx is clear.  Eyes:     General: Lids are normal. Gaze aligned appropriately.     Extraocular Movements: Extraocular movements intact.     Conjunctiva/sclera: Conjunctivae normal.     Pupils: Pupils are equal, round, and reactive to light.     Comments: Bilateral equal photophobia  Cardiovascular:     Rate and Rhythm: Normal  rate and regular rhythm.     Heart sounds: Normal heart sounds. No murmur heard.    Comments: AV fistula of left arm Pulmonary:     Effort: Pulmonary effort is normal. No respiratory distress.     Breath sounds: Normal breath sounds.  Chest:     Chest wall: No tenderness.  Abdominal:     General: There is no distension.     Palpations: Abdomen is soft.     Tenderness: There is abdominal tenderness in the right upper quadrant, epigastric area and left upper quadrant. There is no guarding or rebound.  Musculoskeletal:        General: No swelling.     Cervical back: Neck supple.  Skin:    General: Skin is warm and dry.     Capillary Refill: Capillary refill takes less than 2 seconds.  Neurological:     General: No focal deficit present.     Mental Status: She is alert and oriented to person, place, and time. Mental status is at baseline.     GCS: GCS eye subscore is 4. GCS verbal subscore is 5. GCS motor subscore is 6.     Cranial Nerves: No dysarthria or facial asymmetry.     Sensory: Sensation is intact.     Motor: Motor function is intact. No weakness, tremor or seizure activity.     Coordination: Coordination normal.     Comments: At baseline per family member.  Difficulty with gait due to underlying conditions.  Psychiatric:        Mood and Affect: Mood normal.     ED Results / Procedures / Treatments   Labs (all labs ordered are listed, but only abnormal results are displayed) Labs Reviewed  COMPREHENSIVE METABOLIC PANEL - Abnormal; Notable for the following components:      Result Value   Chloride 94 (*)    CO2 34 (*)    Creatinine, Ser 3.35 (*)    GFR, Estimated 18 (*)    All other components within normal limits  CBC - Abnormal; Notable for the following components:   RDW 15.7 (*)    nRBC 0.5 (*)    All other components within normal limits  LIPASE, BLOOD  URINALYSIS, ROUTINE W REFLEX MICROSCOPIC  POC URINE PREG, ED    EKG None  Radiology CT Abdomen  Pelvis Wo Contrast  Result Date: 02/08/2022 CLINICAL DATA:  Acute abdominal pain. Nausea and vomiting. Diarrhea. EXAM: CT ABDOMEN AND PELVIS WITHOUT CONTRAST TECHNIQUE: Multidetector CT imaging of the abdomen and pelvis was performed following the standard protocol without IV contrast. RADIATION DOSE REDUCTION: This exam was performed according to the departmental dose-optimization program which includes automated exposure control, adjustment of the mA and/or kV according to patient size and/or use of iterative reconstruction technique. COMPARISON:  10/16/2021 FINDINGS: Lower chest: No acute findings. Hepatobiliary: No mass visualized on this unenhanced exam. Gallbladder is unremarkable. No evidence of biliary ductal dilatation. Pancreas: No mass or  inflammatory process visualized on this unenhanced exam. Spleen:  Within normal limits in size. Adrenals/Urinary tract: Kidneys are poorly visualized. The left kidney appears enlarged and involved by numerous cystic areas, without significant change since previous study. 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 Right kidney is not visualized. The urinary bladder is distended and shows mild diffuse wall thickening and calcification, also unchanged in appearance. Stomach/Bowel: Limited evaluation on this exam without oral and IV contrast. No acute findings identified. Vascular/Lymphatic:  Limited visualization on this noncontrast exam. Reproductive: A large left ovarian dermoid is seen in the left pelvis measuring 8.0 x 7.9 cm, without significant change since prior study. Other:  None. 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 IMPRESSION: Technically limited exam due to lack of oral and IV contrast. No acute findings identified. Stable 8 cm left ovarian  dermoid. Stable distended urinary bladder with mild diffuse wall thickening. 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 Stable posterior sacral meningocele. Electronically Signed   By: Marlaine Hind M.D.   On: 02/08/2022 17:12   CT Head Wo Contrast  Result Date: 02/08/2022 CLINICAL DATA:  Headaches EXAM: CT HEAD WITHOUT CONTRAST TECHNIQUE: Contiguous axial images were obtained from the base of the skull through the vertex without intravenous contrast. RADIATION DOSE REDUCTION: This exam was performed according to the departmental dose-optimization program which includes automated exposure control, adjustment of the mA and/or kV according to patient size and/or use of iterative reconstruction technique. COMPARISON:  04/29/2020 FINDINGS: Brain: No acute intracranial findings are seen. There are no signs of bleeding within the cranium. Ventricles are not dilated. There is no focal mass effect. Multiple new coarse calcifications are seen in tentorium and falx. There is no evidence of any soft tissue masses or edema adjacent to these dural calcifications. Vascular: Unremarkable. Skull: Unremarkable. Sinuses/Orbits: Unremarkable. Other: None. IMPRESSION: No acute intracranial findings are seen in noncontrast CT brain. There are multiple coarse calcifications in the dura, possibly suggesting dystrophic dural calcifications. Electronically Signed   By: Elmer Picker M.D.   On: 02/08/2022 16:05    Procedures Procedures    Medications Ordered in ED Medications  metoCLOPramide (REGLAN) injection 5 mg (5 mg Intravenous Given 02/08/22 1650)  diphenhydrAMINE (BENADRYL) injection 25 mg (25 mg Intravenous Given 02/08/22 1650)  dexamethasone (DECADRON) injection 10 mg (10 mg Intravenous Given 02/08/22 1650)    ED Course/ Medical Decision Making/ A&P Clinical Course as of 02/08/22 1913  Sat Feb 08, 2022   1901 Handoff: follow up on urine [WF]    Clinical Course User Index [WF] Tedd Sias, [place]                           Medical Decision Making Amount and/or Complexity of Data Reviewed Labs: ordered. Radiology: ordered.  Risk Prescription drug management.   31 y.o. female presents to the ED for concern of Abdominal Pain   This involves an extensive number of treatment options, and is a complaint that carries with it a high risk of complications and morbidity.     Past Medical History / Co-morbidities / Social History: Hx of spina bifida with caudal regression syndrome, ESRD on dialysis TTS, hypertension, GERD, previous severe headaches, and previous UTI/pyelonephritis episodes who presents with  headache Social Determinants of Health include: None  Additional History:  Internal and external records from outside source obtained and reviewed including ED visits  Lab Tests: I ordered, and personally interpreted labs.  The pertinent results include:   CBC: Unremarkable CMP/BMP: GFR 18, creatinine 3.35, BUN 19 Lipase 30 UA: Pending  Imaging Studies: I ordered imaging studies including CT head for worsening headache with new features, and CT abdomen and pelvis for new tenderness.   I independently visualized and interpreted imaging which showed negative for acute intracranial abnormalities or acute abdominopelvic pathology. Stable 8 cm left ovarian dermoid.  Stable distended urinary bladder with mild diffuse wall thickening.  Stable dysplastic appearance of left kidney.  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  Stable posterior sacral meningocele I agree with the radiologist interpretation.  ED Course: Pt well-appearing on exam.  Nonseptic, nontoxic appearing in NAD.  AAOx4.  Significant history for spina bifida with caudal regression syndrome.  Presenting with worsening and different headache from her normal  migraines.  Usually migraines are the right side of her head, but this involves her entire head and is much more painful than prior.  Also with worse and prolonged nausea and vomiting with upper abdominal pain and some diarrhea.  No vomiting or diarrhea appreciated during duration of encounter.  Usually has abdominal pain when she has a UTI or cystitis or pyelonephritis, is concerned she may have this.  She is ESRD on dialysis for the last 11 to 12 years.  Still makes about half a cup of urine per day, also self caths once or twice a week.  Due to the variation of headache characteristics and concern for possible UTI, plan to proceed with imaging and possible migraine cocktail. CT head negative for ICH, low suspicion for [facility].  We will proceed with migraine cocktail and CT abdomen and pelvis, then reassess.  Still unable to produce urine at this time, states this usually happens when she has dialysis which she attended this morning and had no complications. Upon reevaluation, patient's headache has almost completely resolved following migraine cocktail, states her head feels much better.  CT abdomen and pelvis negative for acute findings.  No evidence of pyelonephritis, bowel obstruction, bowel perforation, acute cystitis, pancreatitis.  Urine still pending to evaluate for possible UTI.  Disposition: 1910  care of [place] transferred to [place] [facility]  at the end of my shift.  Patient case discussed at length.  Please see his/her note for further details.  Plan at time of handoff is dependent on UA results.  If UA is indicative of infection, plan to treat with antibiotics.  Separate of UA results, recommend close follow-up with neurology for reevaluation of migraine prophylaxis and abortive therapy regimen.  This may be altered or completely changed at the discretion of the oncoming team pending results of further workup.   This patient was a shared case encounter with my attending, Dr. Rogene Houston,  who directly contributed to the proposed treatment course and cosigned this note including patient's presenting symptoms, physical exam, and planned diagnostics and interventions.  Attending physician stated agreement with plan or made changes to plan which were implemented.     This chart was dictated using voice recognition software.  Despite best efforts to proofread, errors can occur which can change the documentation meaning.         Final Clinical Impression(s) / ED Diagnoses Final diagnoses:  Nausea vomiting and diarrhea  Bad  headache  ESRD (end stage renal disease) [facility])    Rx / DC Orders ED Discharge Orders     None         Candace Cruise [birth year] Lezlie Octave, MD 02/09/22 1721

## 2022-02-08 NOTE — ED Triage Notes (Addendum)
Pt presents with abd pain, HA, N/V/D x 3 days. Pt denies sick contacts. Pt has taken APAP for HA, and immodium for diarrhea with temporary relief. Pt has taken zofran without relief. Pt reports 3 episodes of emesis in 24hrs.

## 2022-02-08 NOTE — ED Notes (Signed)
Bladder scanned the pt. The most I saw was 132m

## 2022-02-08 NOTE — Discharge Instructions (Addendum)
Please follow-up with your neurologist within the next few days for reevaluation of current migraine treatment regimen.  Return to the ED for new or worsening symptoms as discussed.  You are being treated for urinary tract infection.  We did obtain a urine culture today and will let you know if the antibiotics I prescribed you will not cover your UTI.

## 2022-02-08 NOTE — ED Provider Notes (Signed)
Accepted handoff at shift change from [facility]. Please see prior provider note for more detail.   Briefly: Patient is 31 y.o.   Per prior PA  "[name] is a 31 y.o. female with history of spina bifida with caudal regression syndrome, ESRD on dialysis TTS, hypertension, GERD, previous severe headaches, and previous UTI/pyelonephritis episodes who presents with headache.  Presenting today with headache/migraine of new features accompanied by N/V/D and abdominal pain.  Nauseous daily at her baseline, but states this is much more than her norm.  Usually gets headaches 2 out of 3 days on average, but these usually affect the right side of her forehead.  States this headache encompasses her entire head and has lasted for at least 5 days.  Denies vision changes, focal deficits, neck pain, fevers, chest pain, flank pain, back pain, or shortness of breath.  Vomiting and diarrhea are nonbloody.  Abdominal pain localized to the upper region and usually occurs when she has a UTI.  Dialysis successful this morning, had some vomiting during the session.     The history is provided by the patient and medical records. "    DDX: concern for UTI  Plan: Follow-up on urinalysis    Physical Exam  BP 122/76   Pulse 81   Temp 98.3 F (36.8 C) (Oral)   Resp 18   Ht 2' (0.61 m)   Wt 27.2 kg   SpO2 98%   BMI 73.24 kg/m   Physical Exam Vitals and nursing note reviewed.  Constitutional:      General: She is not in acute distress.    Appearance: Normal appearance. She is not ill-appearing.     Comments: Pleasant well-appearing 31 year old.  In no acute distress.  Sitting comfortably in bed.  Able answer questions appropriately follow commands. No increased work of breathing. Speaking in full sentences.   HENT:     Head: Normocephalic and atraumatic.  Eyes:     General: No scleral icterus.       Right eye: No discharge.        Left eye: No discharge.     Conjunctiva/sclera: Conjunctivae  normal.  Pulmonary:     Effort: Pulmonary effort is normal.     Breath sounds: No stridor.  Abdominal:     Tenderness: There is no abdominal tenderness.     Comments: Abdomen soft nontender.  Neurological:     Mental Status: She is alert and oriented to person, place, and time. Mental status is at baseline.     Procedures  Procedures Results for orders placed or performed during the hospital encounter of 02/08/22  Lipase, blood  Result Value Ref Range   Lipase 30 11 - 51 U/L  Comprehensive metabolic panel  Result Value Ref Range   Sodium 141 135 - 145 mmol/L   Potassium 3.6 3.5 - 5.1 mmol/L   Chloride 94 (L) 98 - 111 mmol/L   CO2 34 (H) 22 - 32 mmol/L   Glucose, Bld 91 70 - 99 mg/dL   BUN 19 6 - 20 mg/dL   Creatinine, Ser 3.35 (H) 0.44 - 1.00 mg/dL   Calcium 9.8 8.9 - 10.3 mg/dL   Total Protein 7.0 6.5 - 8.1 g/dL   Albumin 3.5 3.5 - 5.0 g/dL   AST 20 15 - 41 U/L   ALT 28 0 - 44 U/L   Alkaline Phosphatase 67 38 - 126 U/L   Total Bilirubin 1.0 0.3 - 1.2 mg/dL   GFR, Estimated  18 (L) >60 mL/min   Anion gap 13 5 - 15  CBC  Result Value Ref Range   WBC 5.8 4.0 - 10.5 K/uL   RBC 3.91 3.87 - 5.11 MIL/uL   Hemoglobin 12.2 12.0 - 15.0 g/dL   HCT 38.2 36.0 - 46.0 %   MCV 97.7 80.0 - 100.0 fL   MCH 31.2 26.0 - 34.0 pg   MCHC 31.9 30.0 - 36.0 g/dL   RDW 15.7 (H) 11.5 - 15.5 %   Platelets 216 150 - 400 K/uL   nRBC 0.5 (H) 0.0 - 0.2 %   CT Abdomen Pelvis Wo Contrast  Result Date: 02/08/2022 CLINICAL DATA:  Acute abdominal pain. Nausea and vomiting. Diarrhea. EXAM: CT ABDOMEN AND PELVIS WITHOUT CONTRAST TECHNIQUE: Multidetector CT imaging of the abdomen and pelvis was performed following the standard protocol without IV contrast. RADIATION DOSE REDUCTION: This exam was performed according to the departmental dose-optimization program which includes automated exposure control, adjustment of the mA and/or kV according to patient size and/or use of iterative reconstruction technique.  COMPARISON:  10/16/2021 FINDINGS: Lower chest: No acute findings. Hepatobiliary: No mass visualized on this unenhanced exam. Gallbladder is unremarkable. No evidence of biliary ductal dilatation. Pancreas: No mass or inflammatory process visualized on this unenhanced exam. Spleen:  Within normal limits in size. Adrenals/Urinary tract: Kidneys are poorly visualized. The left kidney appears enlarged and involved by numerous cystic areas, without significant change since previous study. 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 Right kidney is not visualized. The urinary bladder is distended and shows mild diffuse wall thickening and calcification, also unchanged in appearance. Stomach/Bowel: Limited evaluation on this exam without oral and IV contrast. No acute findings identified. Vascular/Lymphatic:  Limited visualization on this noncontrast exam. Reproductive: A large left ovarian dermoid is seen in the left pelvis measuring 8.0 x 7.9 cm, without significant change since prior study. Other:  None. 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 IMPRESSION: Technically limited exam due to lack of oral and IV contrast. No acute findings identified. Stable 8 cm left ovarian dermoid. Stable distended urinary bladder with mild diffuse wall thickening. 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 Stable posterior sacral meningocele. Electronically Signed   By: Marlaine Hind M.D.   On: 02/08/2022 17:12   CT Head Wo Contrast  Result Date: 02/08/2022 CLINICAL DATA:  Headaches EXAM: CT HEAD WITHOUT CONTRAST TECHNIQUE: Contiguous axial images were obtained from the base of the skull through the vertex without  intravenous contrast. RADIATION DOSE REDUCTION: This exam was performed according to the departmental dose-optimization program which includes automated exposure control, adjustment of the mA and/or kV according to patient size and/or use of iterative reconstruction technique. COMPARISON:  04/29/2020 FINDINGS: Brain: No acute intracranial findings are seen. There are no signs of bleeding within the cranium. Ventricles are not dilated. There is no focal mass effect. Multiple new coarse calcifications are seen in tentorium and falx. There is no evidence of any soft tissue masses or edema adjacent to these dural calcifications. Vascular: Unremarkable. Skull: Unremarkable. Sinuses/Orbits: Unremarkable. Other: None. IMPRESSION: No acute intracranial findings are seen in noncontrast CT brain. There are multiple coarse calcifications in the dura, possibly suggesting dystrophic dural calcifications. Electronically Signed   By: Prudy Feeler.D.  On: 02/08/2022 16:05      CLINICAL DATA: Acute abdominal pain. Nausea and vomiting. Diarrhea.  EXAM: CT ABDOMEN AND PELVIS WITHOUT CONTRAST  TECHNIQUE: Multidetector CT imaging of the abdomen and pelvis was performed following the standard protocol without IV contrast.  RADIATION DOSE REDUCTION: This exam was performed according to the departmental dose-optimization program which includes automated exposure control, adjustment of the mA and/or kV according to patient size and/or use of iterative reconstruction technique.  COMPARISON: 10/16/2021  FINDINGS: Lower chest: No acute findings.  Hepatobiliary: No mass visualized on this unenhanced exam. Gallbladder is unremarkable. No evidence of biliary ductal dilatation.  Pancreas: No mass or inflammatory process visualized on this unenhanced exam.  Spleen: Within normal limits in size.  Adrenals/Urinary tract: Kidneys are poorly visualized. The left kidney appears enlarged and involved by numerous  cystic areas, without significant change since previous study. 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 Right kidney is not visualized. The urinary bladder is distended and shows mild diffuse wall thickening and calcification, also unchanged in appearance.  Stomach/Bowel: Limited evaluation on this exam without oral and IV contrast. No acute findings identified.  Vascular/Lymphatic: Limited visualization on this noncontrast exam.  Reproductive: A large left ovarian dermoid is seen in the left pelvis measuring 8.0 x 7.9 cm, without significant change since prior study.  Other: None.  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  IMPRESSION: Technically limited exam due to lack of oral and IV contrast. No acute findings identified.  Stable 8 cm left ovarian dermoid.  Stable distended urinary bladder with mild diffuse wall thickening. Stable dysplastic appearance of left kidney.  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 Stable posterior sacral meningocele.   Electronically Signed By: Marlaine Hind M.D. On: 02/08/2022 17:12     ED Course / MDM   Clinical Course as of 02/08/22 1912  Sat Feb 08, 2022  1901 Handoff: follow up on urine [WF]    Clinical Course User Index [WF] Tedd Sias, [place]   Medical Decision Making Amount and/or Complexity of Data Reviewed Labs: ordered. Radiology: ordered.  Risk Prescription drug management.   Patient care received in handoff from prior provider.  Plan at this time is to follow-up on urine sample.  If convincing for UTI will treat with antibiotics.  Otherwise we will discharge home with recommendation to follow-up with PCP.  Please see prior  provider note for full history and interpretation of other labs and exam findings.       Tedd Sias, [place] 02/08/22 2009    Fredia Sorrow, MD 02/14/22 [phone]

## 2022-02-11 DIAGNOSIS — N186 End stage renal disease: Secondary | ICD-10-CM | POA: Diagnosis not present

## 2022-02-11 DIAGNOSIS — Z992 Dependence on renal dialysis: Secondary | ICD-10-CM | POA: Diagnosis not present

## 2022-02-11 DIAGNOSIS — N2581 Secondary hyperparathyroidism of renal origin: Secondary | ICD-10-CM | POA: Diagnosis not present

## 2022-02-11 LAB — URINE CULTURE: Culture: >=100000 — AB

## 2022-02-12 ENCOUNTER — Telehealth: Payer: Self-pay | Admitting: *Deleted

## 2022-02-12 NOTE — Telephone Encounter (Signed)
Post ED Visit - Positive Culture Follow-up  Culture report reviewed by antimicrobial stewardship pharmacist: [place] Team '[]'$  Elenor Quinones, Pharm.D. '[]'$  Heide Guile, Pharm.D., BCPS AQ-ID '[]'$  Parks Neptune, Pharm.D., BCPS '[]'$  Alycia Rossetti, Pharm.D., BCPS '[]'$  Wagoner, Pharm.D., BCPS, AAHIVP '[]'$  Legrand Como, Pharm.D., BCPS, AAHIVP '[x]'$  Salome Arnt, PharmD, BCPS '[]'$  Johnnette Gourd, PharmD, BCPS '[]'$  Hughes Better, PharmD, BCPS '[]'$  Leeroy Cha, PharmD '[]'$  Laqueta Linden, PharmD, BCPS '[]'$  Albertina Parr, PharmD  [place] Team '[]'$  Leodis Sias, PharmD '[]'$  Lindell Spar, PharmD '[]'$  Royetta Asal, PharmD '[]'$  Graylin Shiver, Rph '[]'$  Rema Fendt) Glennon Mac, PharmD '[]'$  Arlyn Dunning, PharmD '[]'$  Netta Cedars, PharmD '[]'$  Dia Sitter, PharmD '[]'$  Leone Haven, PharmD '[]'$  Gretta Arab, PharmD '[]'$  Theodis Shove, PharmD '[]'$  Peggyann Juba, PharmD '[]'$  Reuel Boom, PharmD   Positive urine culture Treated with Cephalexin, organism sensitive to the same and no further patient follow-up is required at this time.  Harlon Flor [facility] 02/12/2022, 9:13 AM

## 2022-02-13 DIAGNOSIS — N2581 Secondary hyperparathyroidism of renal origin: Secondary | ICD-10-CM | POA: Diagnosis not present

## 2022-02-13 DIAGNOSIS — Z992 Dependence on renal dialysis: Secondary | ICD-10-CM | POA: Diagnosis not present

## 2022-02-13 DIAGNOSIS — E039 Hypothyroidism, unspecified: Secondary | ICD-10-CM | POA: Diagnosis not present

## 2022-02-13 DIAGNOSIS — N186 End stage renal disease: Secondary | ICD-10-CM | POA: Diagnosis not present

## 2022-02-15 DIAGNOSIS — N186 End stage renal disease: Secondary | ICD-10-CM | POA: Diagnosis not present

## 2022-02-15 DIAGNOSIS — N2581 Secondary hyperparathyroidism of renal origin: Secondary | ICD-10-CM | POA: Diagnosis not present

## 2022-02-15 DIAGNOSIS — Z992 Dependence on renal dialysis: Secondary | ICD-10-CM | POA: Diagnosis not present

## 2022-02-18 DIAGNOSIS — N2581 Secondary hyperparathyroidism of renal origin: Secondary | ICD-10-CM | POA: Diagnosis not present

## 2022-02-18 DIAGNOSIS — Z992 Dependence on renal dialysis: Secondary | ICD-10-CM | POA: Diagnosis not present

## 2022-02-18 DIAGNOSIS — N186 End stage renal disease: Secondary | ICD-10-CM | POA: Diagnosis not present

## 2022-02-20 DIAGNOSIS — N2581 Secondary hyperparathyroidism of renal origin: Secondary | ICD-10-CM | POA: Diagnosis not present

## 2022-02-20 DIAGNOSIS — N186 End stage renal disease: Secondary | ICD-10-CM | POA: Diagnosis not present

## 2022-02-20 DIAGNOSIS — Z992 Dependence on renal dialysis: Secondary | ICD-10-CM | POA: Diagnosis not present

## 2022-02-22 DIAGNOSIS — Z992 Dependence on renal dialysis: Secondary | ICD-10-CM | POA: Diagnosis not present

## 2022-02-22 DIAGNOSIS — N2581 Secondary hyperparathyroidism of renal origin: Secondary | ICD-10-CM | POA: Diagnosis not present

## 2022-02-22 DIAGNOSIS — N186 End stage renal disease: Secondary | ICD-10-CM | POA: Diagnosis not present

## 2022-02-25 DIAGNOSIS — N186 End stage renal disease: Secondary | ICD-10-CM | POA: Diagnosis not present

## 2022-02-25 DIAGNOSIS — N2581 Secondary hyperparathyroidism of renal origin: Secondary | ICD-10-CM | POA: Diagnosis not present

## 2022-02-25 DIAGNOSIS — E8779 Other fluid overload: Secondary | ICD-10-CM | POA: Diagnosis not present

## 2022-02-25 DIAGNOSIS — Z992 Dependence on renal dialysis: Secondary | ICD-10-CM | POA: Diagnosis not present

## 2022-02-25 DIAGNOSIS — E8770 Fluid overload, unspecified: Secondary | ICD-10-CM | POA: Diagnosis not present

## 2022-02-25 DIAGNOSIS — I120 Hypertensive chronic kidney disease with stage 5 chronic kidney disease or end stage renal disease: Secondary | ICD-10-CM | POA: Diagnosis not present

## 2022-02-26 ENCOUNTER — Ambulatory Visit: Payer: Self-pay

## 2022-02-26 NOTE — Patient Outreach (Signed)
  Care Coordination   Initial Visit Note   02/26/2022 Name: [name] MRN: [id number] DOB: 03-Nov-1990  [name] is a 31 y.o. year old female who sees Lucianne Lei, MD for primary care. I spoke with  [name] by phone today  What matters to the patients health and wellness today?  Patient needs assistance obtaining a power W/C and a W/C ramp.     Goals Addressed       Patient Stated     "I need a power W/C and a ramp" (pt-stated)        Care Coordination Interventions: Determined patient is currently W/C bound, she uses a manual W/C but needs help obtaining a power W/C Assessed for falls and or fall risk, patient denies having any falls Assessed for other home safety needs, patient needs a W/C ramp Educated patient on process to obtain power W/C Collaborated with Dr. Criss Rosales regarding patient's need for a power W/C, advised patient will need a face to face visit and all pertinent documentation and RX should be sent to [place], advised patient has used [facility] in the past for other DME needs Reviewed scheduled/upcoming provider appointments including with PCP, Dr. Lucianne Lei scheduled for 03/07/22 '@2'$ :00 PM  Social Work referral for assistance with obtaining a W/C ramp Discussed plans with patient for ongoing care management follow up and provided patient with direct contact information for care management team       SDOH assessments and interventions completed:  Yes     Care Coordination Interventions Activated:  Yes  Care Coordination Interventions:  Yes, provided   Follow up plan: Follow up call scheduled for 03/19/22 '@12'$ :30 PM    Encounter Outcome:  Pt. Visit Completed

## 2022-02-26 NOTE — Patient Instructions (Signed)
Visit Information  Thank you for taking time to visit with me today. Please don't hesitate to contact me if I can be of assistance to you.   Following are the goals we discussed today:   Goals Addressed       Patient Stated     "I need a power W/C and a ramp" (pt-stated)        Care Coordination Interventions: Determined patient is currently W/C bound, she uses a manual W/C but needs help obtaining a power W/C Assessed for falls and or fall risk, patient denies having any falls Assessed for other home safety needs, patient needs a W/C ramp Educated patient on process to obtain power W/C Collaborated with Dr. Criss Rosales regarding patient's need for a power W/C, advised patient will need a face to face visit and all pertinent documentation and RX should be sent to [place], advised patient has used [facility] in the past for other DME needs Reviewed scheduled/upcoming provider appointments including with PCP, Dr. Lucianne Lei scheduled for 03/07/22 '@2'$ :00 PM  Social Work referral for assistance with obtaining a W/C ramp Discussed plans with patient for ongoing care management follow up and provided patient with direct contact information for care management team     Our next appointment is by telephone on 03/19/22 at 12:30 PM   Please call the care guide team at [phone] if you need to cancel or reschedule your appointment.   If you are experiencing a Mental Health or [place] or need someone to talk to, please call 1-800-273-TALK (toll free, 24 hour hotline)  Patient verbalizes understanding of instructions and care plan provided today and agrees to view in [place]. Active MyChart status and patient understanding of how to access instructions and care plan via MyChart confirmed with patient.     Barb Merino, RN, BSN, CCM Care Management Coordinator [facility] Care Management Direct Phone: [phone]

## 2022-02-27 DIAGNOSIS — E8770 Fluid overload, unspecified: Secondary | ICD-10-CM | POA: Diagnosis not present

## 2022-02-27 DIAGNOSIS — N2581 Secondary hyperparathyroidism of renal origin: Secondary | ICD-10-CM | POA: Diagnosis not present

## 2022-02-27 DIAGNOSIS — N186 End stage renal disease: Secondary | ICD-10-CM | POA: Diagnosis not present

## 2022-02-27 DIAGNOSIS — Z992 Dependence on renal dialysis: Secondary | ICD-10-CM | POA: Diagnosis not present

## 2022-02-27 DIAGNOSIS — E8779 Other fluid overload: Secondary | ICD-10-CM | POA: Diagnosis not present

## 2022-03-01 DIAGNOSIS — N2581 Secondary hyperparathyroidism of renal origin: Secondary | ICD-10-CM | POA: Diagnosis not present

## 2022-03-01 DIAGNOSIS — Z992 Dependence on renal dialysis: Secondary | ICD-10-CM | POA: Diagnosis not present

## 2022-03-01 DIAGNOSIS — E8779 Other fluid overload: Secondary | ICD-10-CM | POA: Diagnosis not present

## 2022-03-01 DIAGNOSIS — N186 End stage renal disease: Secondary | ICD-10-CM | POA: Diagnosis not present

## 2022-03-01 DIAGNOSIS — E8770 Fluid overload, unspecified: Secondary | ICD-10-CM | POA: Diagnosis not present

## 2022-03-03 ENCOUNTER — Ambulatory Visit: Payer: Self-pay

## 2022-03-03 NOTE — Patient Outreach (Signed)
  Care Coordination   03/03/2022 Name: [name] MRN: [id number] DOB: 1990-11-14   Care Coordination Outreach Attempts:  An unsuccessful telephone outreach was attempted today to offer the patient information about available care coordination services as a benefit of their health plan.   Follow Up Plan:  Additional outreach attempts will be made to offer the patient care coordination information and services.   Encounter Outcome:  No Answer  Care Coordination Interventions Activated:  No   Care Coordination Interventions:  No, not indicated    Daneen Schick, BSW, CDP Social Worker, Certified Dementia Practitioner Care Coordination [phone]

## 2022-03-04 DIAGNOSIS — N2581 Secondary hyperparathyroidism of renal origin: Secondary | ICD-10-CM | POA: Diagnosis not present

## 2022-03-04 DIAGNOSIS — Z992 Dependence on renal dialysis: Secondary | ICD-10-CM | POA: Diagnosis not present

## 2022-03-04 DIAGNOSIS — E8779 Other fluid overload: Secondary | ICD-10-CM | POA: Diagnosis not present

## 2022-03-04 DIAGNOSIS — E8770 Fluid overload, unspecified: Secondary | ICD-10-CM | POA: Diagnosis not present

## 2022-03-04 DIAGNOSIS — N186 End stage renal disease: Secondary | ICD-10-CM | POA: Diagnosis not present

## 2022-03-06 DIAGNOSIS — Z992 Dependence on renal dialysis: Secondary | ICD-10-CM | POA: Diagnosis not present

## 2022-03-06 DIAGNOSIS — E8770 Fluid overload, unspecified: Secondary | ICD-10-CM | POA: Diagnosis not present

## 2022-03-06 DIAGNOSIS — E8779 Other fluid overload: Secondary | ICD-10-CM | POA: Diagnosis not present

## 2022-03-06 DIAGNOSIS — N2581 Secondary hyperparathyroidism of renal origin: Secondary | ICD-10-CM | POA: Diagnosis not present

## 2022-03-06 DIAGNOSIS — N186 End stage renal disease: Secondary | ICD-10-CM | POA: Diagnosis not present

## 2022-03-07 ENCOUNTER — Ambulatory Visit: Payer: Self-pay

## 2022-03-07 NOTE — Patient Outreach (Signed)
  Care Coordination   03/07/2022 Name: [name] MRN: [id number] DOB: 10/16/1990   Care Coordination Outreach Attempts:  A second unsuccessful outreach was attempted today to offer the patient with information about available care coordination services as a benefit of their health plan.     Follow Up Plan:  Additional outreach attempts will be made to offer the patient care coordination information and services.   Encounter Outcome:  No Answer  Care Coordination Interventions Activated:  No   Care Coordination Interventions:  No, not indicated    Daneen Schick, BSW, CDP Social Worker, Certified Dementia Practitioner Care Coordination [phone]

## 2022-03-08 DIAGNOSIS — E8779 Other fluid overload: Secondary | ICD-10-CM | POA: Diagnosis not present

## 2022-03-08 DIAGNOSIS — Z992 Dependence on renal dialysis: Secondary | ICD-10-CM | POA: Diagnosis not present

## 2022-03-08 DIAGNOSIS — N186 End stage renal disease: Secondary | ICD-10-CM | POA: Diagnosis not present

## 2022-03-08 DIAGNOSIS — E8770 Fluid overload, unspecified: Secondary | ICD-10-CM | POA: Diagnosis not present

## 2022-03-08 DIAGNOSIS — N2581 Secondary hyperparathyroidism of renal origin: Secondary | ICD-10-CM | POA: Diagnosis not present

## 2022-03-11 DIAGNOSIS — N2581 Secondary hyperparathyroidism of renal origin: Secondary | ICD-10-CM | POA: Diagnosis not present

## 2022-03-11 DIAGNOSIS — E8770 Fluid overload, unspecified: Secondary | ICD-10-CM | POA: Diagnosis not present

## 2022-03-11 DIAGNOSIS — E8779 Other fluid overload: Secondary | ICD-10-CM | POA: Diagnosis not present

## 2022-03-11 DIAGNOSIS — Z992 Dependence on renal dialysis: Secondary | ICD-10-CM | POA: Diagnosis not present

## 2022-03-11 DIAGNOSIS — N186 End stage renal disease: Secondary | ICD-10-CM | POA: Diagnosis not present

## 2022-03-12 ENCOUNTER — Ambulatory Visit: Payer: Self-pay

## 2022-03-12 DIAGNOSIS — Z992 Dependence on renal dialysis: Secondary | ICD-10-CM | POA: Diagnosis not present

## 2022-03-12 DIAGNOSIS — N2581 Secondary hyperparathyroidism of renal origin: Secondary | ICD-10-CM | POA: Diagnosis not present

## 2022-03-12 DIAGNOSIS — N186 End stage renal disease: Secondary | ICD-10-CM | POA: Diagnosis not present

## 2022-03-12 DIAGNOSIS — E8770 Fluid overload, unspecified: Secondary | ICD-10-CM | POA: Diagnosis not present

## 2022-03-12 DIAGNOSIS — E8779 Other fluid overload: Secondary | ICD-10-CM | POA: Diagnosis not present

## 2022-03-12 NOTE — Patient Instructions (Signed)
Visit Information  Thank you for taking time to visit with me today. Please don't hesitate to contact me if I can be of assistance to you.   Following are the goals we discussed today:   Goals Addressed             This Visit's Progress    Obtain a wheelchair ramp - SW care plan       Care Coordination Interventions: Collaboration with RN Care Manager who indicates patient is in need of a wheelchair ramp Telephonic visit completed with the patent to determined she currently lives with her boyfriend who is renting a home and is in needs of a wheelchair ramp Discussed plan to place patient referral to [facility]  Referral placed via [place] Scheduled follow up call over the next month        Our next appointment is by telephone on 9/13 at 11:45  Please call the care guide team at [phone] if you need to cancel or reschedule your appointment.   If you are experiencing a Mental Health or [place] or need someone to talk to, please call 1-800-273-TALK (toll free, 24 hour hotline)  Patient verbalizes understanding of instructions and care plan provided today and agrees to view in [place]. Active MyChart status and patient understanding of how to access instructions and care plan via MyChart confirmed with patient.     Telephone follow up appointment with care management team member scheduled for:9/13  Daneen Schick, BSW, CDP Social Worker, Certified Dementia Practitioner Care Coordination [phone]

## 2022-03-12 NOTE — Patient Outreach (Signed)
  Care Coordination   Follow Up Visit Note   03/12/2022 Name: [name] MRN: [id number] DOB: [birth year]  [name] is a 31 y.o. year old female who sees Lucianne Lei, MD for primary care. I spoke with  [name] by phone today  What matters to the patients health and wellness today?  To get a wheelchair ramp    Goals Addressed             This Visit's Progress    Obtain a wheelchair ramp - SW care plan       Care Coordination Interventions: Collaboration with RN Care Manager who indicates patient is in need of a wheelchair ramp Telephonic visit completed with the patent to determined she currently lives with her boyfriend who is renting a home and is in needs of a wheelchair ramp Discussed plan to place patient referral to [facility]  Referral placed via [place] Scheduled follow up call over the next month        SDOH assessments and interventions completed:  Yes  SDOH Interventions Today    Flowsheet Row Most Recent Value  SDOH Interventions   Food Insecurity Interventions Intervention Not Indicated  Housing Interventions [id number] Referral  [Referral to [facility] for a W/C ramp]        Care Coordination Interventions Activated:  Yes  Care Coordination Interventions:  Yes, provided   Follow up plan: Follow up call scheduled for 9/13    Encounter Outcome:  Pt. Visit Completed   Daneen Schick, BSW, CDP Social Worker, Certified Dementia Practitioner Care Coordination [phone]

## 2022-03-14 ENCOUNTER — Ambulatory Visit: Payer: Self-pay

## 2022-03-14 DIAGNOSIS — H6522 Chronic serous otitis media, left ear: Secondary | ICD-10-CM | POA: Diagnosis not present

## 2022-03-14 DIAGNOSIS — Z9989 Dependence on other enabling machines and devices: Secondary | ICD-10-CM | POA: Diagnosis not present

## 2022-03-14 DIAGNOSIS — Q059 Spina bifida, unspecified: Secondary | ICD-10-CM | POA: Diagnosis not present

## 2022-03-14 DIAGNOSIS — Z992 Dependence on renal dialysis: Secondary | ICD-10-CM | POA: Diagnosis not present

## 2022-03-14 DIAGNOSIS — Z0000 Encounter for general adult medical examination without abnormal findings: Secondary | ICD-10-CM | POA: Diagnosis not present

## 2022-03-14 DIAGNOSIS — N189 Chronic kidney disease, unspecified: Secondary | ICD-10-CM | POA: Diagnosis not present

## 2022-03-14 DIAGNOSIS — I129 Hypertensive chronic kidney disease with stage 1 through stage 4 chronic kidney disease, or unspecified chronic kidney disease: Secondary | ICD-10-CM | POA: Diagnosis not present

## 2022-03-14 NOTE — Patient Outreach (Signed)
  Care Coordination   Provider collaboration   Visit Note   03/14/2022 Name: DAMARA KLUNDER MRN: [id number] DOB: 11-21-1990  [name] is a 31 y.o. year old female who sees Lucianne Lei, MD for primary care. I  spoke with Bethena Roys from Dr. Fransico Setters office today.   What matters to the patients health and wellness today?  N/a    Goals Addressed       Patient Stated     "I need a power W/C and a ramp" (pt-stated)        Care Coordination Interventions: Determined patient is currently W/C bound, she uses a manual W/C but needs help obtaining a power W/C Assessed for falls and or fall risk, patient denies having any falls Assessed for other home safety needs, patient needs a W/C ramp Educated patient on process to obtain power W/C Collaborated with Dr. Criss Rosales regarding patient's need for a power W/C, advised patient will need a face to face visit and all pertinent documentation and RX should be sent to [place], advised patient has used [facility] in the past for other DME needs Reviewed scheduled/upcoming provider appointments including with PCP, Dr. Lucianne Lei scheduled for 03/07/22 '@2'$ :00 PM  Social Work referral for assistance with obtaining a W/C ramp 03/15/22 Received voice message from [place] with Dr. Fransico Setters office, requesting a return call Placed outbound call to Dr. Criss Rosales, spoke with Bethena Roys regarding patient's handicap status and need for a handicap sign  Advised in addition, patient is requesting assistance with a power W/C and W/C ramp for which a SW referral has been sent  Encouraged Dr. Criss Rosales to call this RN if further assistance is needed     SDOH assessments and interventions completed:  No     Care Coordination Interventions Activated:  Yes  Care Coordination Interventions:  Yes, provided   Follow up plan: Follow up call scheduled for 03/19/22 '@12'$ ;30 PM    Encounter Outcome:  Pt. Visit Completed

## 2022-03-15 DIAGNOSIS — E8770 Fluid overload, unspecified: Secondary | ICD-10-CM | POA: Diagnosis not present

## 2022-03-15 DIAGNOSIS — N186 End stage renal disease: Secondary | ICD-10-CM | POA: Diagnosis not present

## 2022-03-15 DIAGNOSIS — N2581 Secondary hyperparathyroidism of renal origin: Secondary | ICD-10-CM | POA: Diagnosis not present

## 2022-03-15 DIAGNOSIS — Z992 Dependence on renal dialysis: Secondary | ICD-10-CM | POA: Diagnosis not present

## 2022-03-15 DIAGNOSIS — E8779 Other fluid overload: Secondary | ICD-10-CM | POA: Diagnosis not present

## 2022-03-18 DIAGNOSIS — E8779 Other fluid overload: Secondary | ICD-10-CM | POA: Diagnosis not present

## 2022-03-18 DIAGNOSIS — N2581 Secondary hyperparathyroidism of renal origin: Secondary | ICD-10-CM | POA: Diagnosis not present

## 2022-03-18 DIAGNOSIS — Z992 Dependence on renal dialysis: Secondary | ICD-10-CM | POA: Diagnosis not present

## 2022-03-18 DIAGNOSIS — N186 End stage renal disease: Secondary | ICD-10-CM | POA: Diagnosis not present

## 2022-03-18 DIAGNOSIS — E8770 Fluid overload, unspecified: Secondary | ICD-10-CM | POA: Diagnosis not present

## 2022-03-19 ENCOUNTER — Ambulatory Visit: Payer: Self-pay

## 2022-03-19 DIAGNOSIS — E8770 Fluid overload, unspecified: Secondary | ICD-10-CM | POA: Diagnosis not present

## 2022-03-19 DIAGNOSIS — N186 End stage renal disease: Secondary | ICD-10-CM | POA: Diagnosis not present

## 2022-03-19 DIAGNOSIS — N2581 Secondary hyperparathyroidism of renal origin: Secondary | ICD-10-CM | POA: Diagnosis not present

## 2022-03-19 DIAGNOSIS — E8779 Other fluid overload: Secondary | ICD-10-CM | POA: Diagnosis not present

## 2022-03-19 DIAGNOSIS — Z992 Dependence on renal dialysis: Secondary | ICD-10-CM | POA: Diagnosis not present

## 2022-03-19 NOTE — Patient Instructions (Signed)
Visit Information  Thank you for taking time to visit with me today. Please don't hesitate to contact me if I can be of assistance to you.   Following are the goals we discussed today:   Goals Addressed       Patient Stated     "I need a power W/C and a ramp" (pt-stated)        Care Coordination Interventions: Determined patient completed a face to face visit with Dr. Criss Rosales on 03/15/22 for an ear infection  Determined patient discussed her need for an Rx for a power W/C with Dr. 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   If you are experiencing a Mental Health or [place] or need someone to talk to, please call 1-800-273-TALK (toll free, 24 hour hotline)  Patient verbalizes understanding of instructions and care plan provided today and agrees to view in [place]. Active MyChart status and patient understanding of how to access instructions and care plan via MyChart confirmed with patient.     Barb Merino, RN, BSN, CCM Care Management Coordinator [facility] Care Management Direct Phone: [phone]

## 2022-03-19 NOTE — Patient Outreach (Signed)
  Care Coordination   Follow Up Visit Note   03/19/2022 Name: [name] MRN: [id number] DOB: [birth year]  [name] is a 31 y.o. year old female who sees Lucianne Lei, MD for primary care. I spoke with  [name] by phone today  What matters to the patients health and wellness today?  Patient has impaired physical mobility. She needs an Rx for a power wheelchair.     Goals Addressed       Patient Stated     "I need a power W/C and a ramp" (pt-stated)        Care Coordination Interventions: Determined patient completed a face to face visit with Dr. Criss Rosales on 03/15/22 for an ear infection  Determined patient discussed her need for an Rx for a power W/C with Dr. 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, she is not established with an Orthopedic MD Educated patient regarding [facility], provided patient with the location and contact number for this provider Determined patient will call for an appointment to have her orthopedic needs and DME needs evaluated  Reviewed and discussed upcoming telephone SW follow up scheduled for 04/09/22 '@11'$ :75 AM       SDOH assessments and interventions completed:  No     Care Coordination Interventions Activated:  Yes  Care Coordination Interventions:  Yes, provided   Follow up plan: Follow up call scheduled for 05/19/22 '@09'$ :00 AM     Encounter Outcome:  Pt. Visit Completed

## 2022-03-20 DIAGNOSIS — E8770 Fluid overload, unspecified: Secondary | ICD-10-CM | POA: Diagnosis not present

## 2022-03-20 DIAGNOSIS — E8779 Other fluid overload: Secondary | ICD-10-CM | POA: Diagnosis not present

## 2022-03-20 DIAGNOSIS — Z992 Dependence on renal dialysis: Secondary | ICD-10-CM | POA: Diagnosis not present

## 2022-03-20 DIAGNOSIS — N186 End stage renal disease: Secondary | ICD-10-CM | POA: Diagnosis not present

## 2022-03-20 DIAGNOSIS — N2581 Secondary hyperparathyroidism of renal origin: Secondary | ICD-10-CM | POA: Diagnosis not present

## 2022-03-22 DIAGNOSIS — N186 End stage renal disease: Secondary | ICD-10-CM | POA: Diagnosis not present

## 2022-03-22 DIAGNOSIS — N2581 Secondary hyperparathyroidism of renal origin: Secondary | ICD-10-CM | POA: Diagnosis not present

## 2022-03-22 DIAGNOSIS — E8779 Other fluid overload: Secondary | ICD-10-CM | POA: Diagnosis not present

## 2022-03-22 DIAGNOSIS — E8770 Fluid overload, unspecified: Secondary | ICD-10-CM | POA: Diagnosis not present

## 2022-03-22 DIAGNOSIS — Z992 Dependence on renal dialysis: Secondary | ICD-10-CM | POA: Diagnosis not present

## 2022-03-25 DIAGNOSIS — N2581 Secondary hyperparathyroidism of renal origin: Secondary | ICD-10-CM | POA: Diagnosis not present

## 2022-03-25 DIAGNOSIS — N186 End stage renal disease: Secondary | ICD-10-CM | POA: Diagnosis not present

## 2022-03-25 DIAGNOSIS — E8770 Fluid overload, unspecified: Secondary | ICD-10-CM | POA: Diagnosis not present

## 2022-03-25 DIAGNOSIS — Z992 Dependence on renal dialysis: Secondary | ICD-10-CM | POA: Diagnosis not present

## 2022-03-25 DIAGNOSIS — E8779 Other fluid overload: Secondary | ICD-10-CM | POA: Diagnosis not present

## 2022-03-27 DIAGNOSIS — E8770 Fluid overload, unspecified: Secondary | ICD-10-CM | POA: Diagnosis not present

## 2022-03-27 DIAGNOSIS — N2581 Secondary hyperparathyroidism of renal origin: Secondary | ICD-10-CM | POA: Diagnosis not present

## 2022-03-27 DIAGNOSIS — E8779 Other fluid overload: Secondary | ICD-10-CM | POA: Diagnosis not present

## 2022-03-27 DIAGNOSIS — N186 End stage renal disease: Secondary | ICD-10-CM | POA: Diagnosis not present

## 2022-03-27 DIAGNOSIS — Z992 Dependence on renal dialysis: Secondary | ICD-10-CM | POA: Diagnosis not present

## 2022-03-28 DIAGNOSIS — Z992 Dependence on renal dialysis: Secondary | ICD-10-CM | POA: Diagnosis not present

## 2022-03-28 DIAGNOSIS — I120 Hypertensive chronic kidney disease with stage 5 chronic kidney disease or end stage renal disease: Secondary | ICD-10-CM | POA: Diagnosis not present

## 2022-03-28 DIAGNOSIS — N186 End stage renal disease: Secondary | ICD-10-CM | POA: Diagnosis not present

## 2022-03-29 DIAGNOSIS — N2581 Secondary hyperparathyroidism of renal origin: Secondary | ICD-10-CM | POA: Diagnosis not present

## 2022-03-29 DIAGNOSIS — N186 End stage renal disease: Secondary | ICD-10-CM | POA: Diagnosis not present

## 2022-03-29 DIAGNOSIS — Z992 Dependence on renal dialysis: Secondary | ICD-10-CM | POA: Diagnosis not present

## 2022-04-01 DIAGNOSIS — Z992 Dependence on renal dialysis: Secondary | ICD-10-CM | POA: Diagnosis not present

## 2022-04-01 DIAGNOSIS — N186 End stage renal disease: Secondary | ICD-10-CM | POA: Diagnosis not present

## 2022-04-01 DIAGNOSIS — N2581 Secondary hyperparathyroidism of renal origin: Secondary | ICD-10-CM | POA: Diagnosis not present

## 2022-04-03 DIAGNOSIS — Z992 Dependence on renal dialysis: Secondary | ICD-10-CM | POA: Diagnosis not present

## 2022-04-03 DIAGNOSIS — N2581 Secondary hyperparathyroidism of renal origin: Secondary | ICD-10-CM | POA: Diagnosis not present

## 2022-04-03 DIAGNOSIS — N186 End stage renal disease: Secondary | ICD-10-CM | POA: Diagnosis not present

## 2022-04-05 DIAGNOSIS — Z992 Dependence on renal dialysis: Secondary | ICD-10-CM | POA: Diagnosis not present

## 2022-04-05 DIAGNOSIS — N2581 Secondary hyperparathyroidism of renal origin: Secondary | ICD-10-CM | POA: Diagnosis not present

## 2022-04-05 DIAGNOSIS — N186 End stage renal disease: Secondary | ICD-10-CM | POA: Diagnosis not present

## 2022-04-08 DIAGNOSIS — Z992 Dependence on renal dialysis: Secondary | ICD-10-CM | POA: Diagnosis not present

## 2022-04-08 DIAGNOSIS — N186 End stage renal disease: Secondary | ICD-10-CM | POA: Diagnosis not present

## 2022-04-08 DIAGNOSIS — N2581 Secondary hyperparathyroidism of renal origin: Secondary | ICD-10-CM | POA: Diagnosis not present

## 2022-04-09 ENCOUNTER — Telehealth: Payer: Self-pay

## 2022-04-09 NOTE — Patient Outreach (Signed)
  Care Coordination   04/09/2022 Name: [name] MRN: [id number] DOB: 1990-12-25   Care Coordination Outreach Attempts:  An unsuccessful telephone outreach was attempted for a scheduled appointment today.  Follow Up Plan:  Additional outreach attempts will be made to offer the patient care coordination information and services.   Encounter Outcome:  No Answer  Care Coordination Interventions Activated:  No   Care Coordination Interventions:  No, not indicated    Daneen Schick, BSW, CDP Social Worker, Certified Dementia Practitioner Care Coordination [phone]

## 2022-04-10 DIAGNOSIS — Z992 Dependence on renal dialysis: Secondary | ICD-10-CM | POA: Diagnosis not present

## 2022-04-10 DIAGNOSIS — N2581 Secondary hyperparathyroidism of renal origin: Secondary | ICD-10-CM | POA: Diagnosis not present

## 2022-04-10 DIAGNOSIS — N186 End stage renal disease: Secondary | ICD-10-CM | POA: Diagnosis not present

## 2022-04-12 DIAGNOSIS — N186 End stage renal disease: Secondary | ICD-10-CM | POA: Diagnosis not present

## 2022-04-12 DIAGNOSIS — N2581 Secondary hyperparathyroidism of renal origin: Secondary | ICD-10-CM | POA: Diagnosis not present

## 2022-04-12 DIAGNOSIS — Z992 Dependence on renal dialysis: Secondary | ICD-10-CM | POA: Diagnosis not present

## 2022-04-14 ENCOUNTER — Ambulatory Visit: Payer: Self-pay

## 2022-04-14 NOTE — Patient Outreach (Signed)
  Care Coordination   Follow Up Visit Note   04/14/2022 Name: [name] MRN: [id number] DOB: 1991-04-05  [name] is a 31 y.o. year old female who sees Lucianne Lei, MD for primary care. I spoke with  [name] by phone today.  What matters to the patients health and wellness today?  I need a ramp for my home    Goals Addressed             This Visit's Progress    Obtain a wheelchair ramp - SW care plan       Care Coordination Interventions: Determined patient has yet to hear from [facility] regarding a wheelchair ramp Advised the patient SW would follow up to confirm receipt of referral Collaboration with [facility] Jude requesting feedback to confirm referral was received        SDOH assessments and interventions completed:  No     Care Coordination Interventions Activated:  Yes  Care Coordination Interventions:  Yes, provided   Follow up plan:  SW will continue to follow    Encounter Outcome:  Pt. Visit Completed   Nadara Eaton, CDP Social Worker, Certified Dementia Practitioner [place] Coordination [phone]

## 2022-04-14 NOTE — Patient Instructions (Signed)
Visit Information  Thank you for taking time to visit with me today. Please don't hesitate to contact me if I can be of assistance to you.   Following are the goals we discussed today:   Goals Addressed             This Visit's Progress    Obtain a wheelchair ramp - SW care plan       Care Coordination Interventions: Determined patient has yet to hear from [place] regarding a wheelchair ramp Advised the patient SW would follow up to confirm receipt of referral Collaboration with [facility] Jude requesting feedback to confirm referral was received         If you are experiencing a Mental Health or [place] or need someone to talk to, please call 1-800-273-TALK (toll free, 24 hour hotline)  Patient verbalizes understanding of instructions and care plan provided today and agrees to view in [place]. Active MyChart status and patient understanding of how to access instructions and care plan via MyChart confirmed with patient.     I will follow up with you once I speak with [facility] regarding referral status.  Daneen Schick, BSW, CDP Social Worker, Certified Dementia Practitioner [place] [facility]  Care Coordination [phone]

## 2022-04-15 DIAGNOSIS — N186 End stage renal disease: Secondary | ICD-10-CM | POA: Diagnosis not present

## 2022-04-15 DIAGNOSIS — Z992 Dependence on renal dialysis: Secondary | ICD-10-CM | POA: Diagnosis not present

## 2022-04-15 DIAGNOSIS — N2581 Secondary hyperparathyroidism of renal origin: Secondary | ICD-10-CM | POA: Diagnosis not present

## 2022-04-17 DIAGNOSIS — N186 End stage renal disease: Secondary | ICD-10-CM | POA: Diagnosis not present

## 2022-04-17 DIAGNOSIS — N2581 Secondary hyperparathyroidism of renal origin: Secondary | ICD-10-CM | POA: Diagnosis not present

## 2022-04-17 DIAGNOSIS — Z992 Dependence on renal dialysis: Secondary | ICD-10-CM | POA: Diagnosis not present

## 2022-04-18 ENCOUNTER — Ambulatory Visit: Payer: Self-pay

## 2022-04-18 NOTE — Patient Outreach (Signed)
  Care Coordination   Follow Up Visit Note   04/18/2022 Name: LEYDY WORTHEY MRN: [id number] DOB: Feb 12, 1991  [name] is a 31 y.o. year old female who sees Lucianne Lei, MD for primary care. I  collaborated with [facility] to confirm patients referral received. Unsuccessful outbound call placed to the patient to advise of goal progression.  What matters to the patients health and wellness today?  Obtain a wheelchair ramp    Goals Addressed             This Visit's Progress    Obtain a wheelchair ramp - SW care plan       Care Coordination Interventions: Confirmed referral to [facility] received for a wheelchair ramp  Unsuccessful outbound call placed to the patient to advise referral received and patient will be contacted directly by vocational rehab        SDOH assessments and interventions completed:  No     Care Coordination Interventions Activated:  Yes  Care Coordination Interventions:  Yes, provided   Follow up plan:  SW will placed a second attempt to the patient over the next 10 days.    Encounter Outcome:  Pt. Visit Completed   Daneen Schick, BSW, CDP Social Worker, Certified Dementia Practitioner [place] [facility]  Care Coordination [phone]

## 2022-04-19 DIAGNOSIS — N2581 Secondary hyperparathyroidism of renal origin: Secondary | ICD-10-CM | POA: Diagnosis not present

## 2022-04-19 DIAGNOSIS — Z992 Dependence on renal dialysis: Secondary | ICD-10-CM | POA: Diagnosis not present

## 2022-04-19 DIAGNOSIS — N186 End stage renal disease: Secondary | ICD-10-CM | POA: Diagnosis not present

## 2022-04-22 DIAGNOSIS — N186 End stage renal disease: Secondary | ICD-10-CM | POA: Diagnosis not present

## 2022-04-22 DIAGNOSIS — N2581 Secondary hyperparathyroidism of renal origin: Secondary | ICD-10-CM | POA: Diagnosis not present

## 2022-04-22 DIAGNOSIS — Z992 Dependence on renal dialysis: Secondary | ICD-10-CM | POA: Diagnosis not present

## 2022-04-23 ENCOUNTER — Ambulatory Visit: Payer: Self-pay

## 2022-04-23 DIAGNOSIS — M810 Age-related osteoporosis without current pathological fracture: Secondary | ICD-10-CM | POA: Diagnosis not present

## 2022-04-23 DIAGNOSIS — M85852 Other specified disorders of bone density and structure, left thigh: Secondary | ICD-10-CM | POA: Diagnosis not present

## 2022-04-23 NOTE — Patient Instructions (Signed)
Visit Information  Thank you for taking time to visit with me today. Please don't hesitate to contact me if I can be of assistance to you.   Following are the goals we discussed today:   Goals Addressed             This Visit's Progress    COMPLETED: Obtain a wheelchair ramp - SW care plan       Care Coordination Interventions: Advised the patient her referral was successfully placed to [facility] for a wheelchair ramp - patient reports she has not yet received a call from this agency Provided the patient with contact information to follow up on referral and begin application process        If you are experiencing a Mental Health or [place] or need someone to talk to, please call 1-800-273-TALK (toll free, 24 hour hotline)  Patient verbalizes understanding of instructions and care plan provided today and agrees to view in [place]. Active MyChart status and patient understanding of how to access instructions and care plan via MyChart confirmed with patient.     No further follow up required: Please contact me as needed.  Daneen Schick, BSW, CDP Social Worker, Certified Dementia Practitioner [place] [facility]  Care Coordination [phone]

## 2022-04-23 NOTE — Patient Outreach (Signed)
  Care Coordination   Follow Up Visit Note   04/23/2022 Name: [name] MRN: [id number] DOB: 1991/03/29  [name] is a 31 y.o. year old female who sees Lucianne Lei, MD for primary care. I spoke with  [name] by phone today.  What matters to the patients health and wellness today?  To obtain a wheelchair ramp    Goals Addressed             This Visit's Progress    COMPLETED: Obtain a wheelchair ramp - SW care plan       Care Coordination Interventions: Advised the patient her referral was successfully placed to [facility] for a wheelchair ramp - patient reports she has not yet received a call from this agency Provided the patient with contact information to follow up on referral and begin application process        SDOH assessments and interventions completed:  No     Care Coordination Interventions Activated:  Yes  Care Coordination Interventions:  Yes, provided   Follow up plan: No further intervention required.   Encounter Outcome:  Pt. Visit Completed   Daneen Schick, BSW, CDP Social Worker, Certified Dementia Practitioner [place] [facility]  Care Coordination [phone]

## 2022-04-24 DIAGNOSIS — Z992 Dependence on renal dialysis: Secondary | ICD-10-CM | POA: Diagnosis not present

## 2022-04-24 DIAGNOSIS — N186 End stage renal disease: Secondary | ICD-10-CM | POA: Diagnosis not present

## 2022-04-24 DIAGNOSIS — N2581 Secondary hyperparathyroidism of renal origin: Secondary | ICD-10-CM | POA: Diagnosis not present

## 2022-04-26 DIAGNOSIS — N186 End stage renal disease: Secondary | ICD-10-CM | POA: Diagnosis not present

## 2022-04-26 DIAGNOSIS — Z992 Dependence on renal dialysis: Secondary | ICD-10-CM | POA: Diagnosis not present

## 2022-04-26 DIAGNOSIS — N2581 Secondary hyperparathyroidism of renal origin: Secondary | ICD-10-CM | POA: Diagnosis not present

## 2022-04-27 DIAGNOSIS — Z992 Dependence on renal dialysis: Secondary | ICD-10-CM | POA: Diagnosis not present

## 2022-04-27 DIAGNOSIS — N186 End stage renal disease: Secondary | ICD-10-CM | POA: Diagnosis not present

## 2022-04-27 DIAGNOSIS — I120 Hypertensive chronic kidney disease with stage 5 chronic kidney disease or end stage renal disease: Secondary | ICD-10-CM | POA: Diagnosis not present

## 2022-04-28 ENCOUNTER — Encounter (HOSPITAL_COMMUNITY): Payer: Self-pay

## 2022-04-28 ENCOUNTER — Other Ambulatory Visit: Payer: Self-pay

## 2022-04-28 ENCOUNTER — Emergency Department (HOSPITAL_COMMUNITY)
Admission: EM | Admit: 2022-04-28 | Discharge: 2022-04-28 | Disposition: A | Discharge status: Home / Self Care | Payer: Medicare Other | Attending: Emergency Medicine | Admitting: Emergency Medicine

## 2022-04-28 ENCOUNTER — Emergency Department (HOSPITAL_COMMUNITY): Payer: Medicare Other

## 2022-04-28 DIAGNOSIS — Z20822 Contact with and (suspected) exposure to covid-19: Secondary | ICD-10-CM | POA: Diagnosis not present

## 2022-04-28 DIAGNOSIS — I120 Hypertensive chronic kidney disease with stage 5 chronic kidney disease or end stage renal disease: Secondary | ICD-10-CM | POA: Insufficient documentation

## 2022-04-28 DIAGNOSIS — E162 Hypoglycemia, unspecified: Secondary | ICD-10-CM | POA: Insufficient documentation

## 2022-04-28 DIAGNOSIS — N9489 Other specified conditions associated with female genital organs and menstrual cycle: Secondary | ICD-10-CM | POA: Insufficient documentation

## 2022-04-28 DIAGNOSIS — R112 Nausea with vomiting, unspecified: Secondary | ICD-10-CM | POA: Diagnosis not present

## 2022-04-28 DIAGNOSIS — Z79899 Other long term (current) drug therapy: Secondary | ICD-10-CM | POA: Diagnosis not present

## 2022-04-28 DIAGNOSIS — Z992 Dependence on renal dialysis: Secondary | ICD-10-CM | POA: Diagnosis not present

## 2022-04-28 DIAGNOSIS — Z91040 Latex allergy status: Secondary | ICD-10-CM | POA: Diagnosis not present

## 2022-04-28 DIAGNOSIS — Z91010 Allergy to peanuts: Secondary | ICD-10-CM | POA: Insufficient documentation

## 2022-04-28 DIAGNOSIS — D271 Benign neoplasm of left ovary: Secondary | ICD-10-CM | POA: Insufficient documentation

## 2022-04-28 DIAGNOSIS — N186 End stage renal disease: Secondary | ICD-10-CM | POA: Insufficient documentation

## 2022-04-28 DIAGNOSIS — R519 Headache, unspecified: Secondary | ICD-10-CM | POA: Insufficient documentation

## 2022-04-28 LAB — CBC WITH DIFFERENTIAL/PLATELET
Abs Immature Granulocytes: 0.02 10*3/uL (ref 0.00–0.07)
Basophils Absolute: 0 10*3/uL (ref 0.0–0.1)
Basophils Relative: 0 %
Eosinophils Absolute: 0.2 10*3/uL (ref 0.0–0.5)
Eosinophils Relative: 3 %
HCT: 35.8 % — ABNORMAL LOW (ref 36.0–46.0)
Hemoglobin: 11.5 g/dL — ABNORMAL LOW (ref 12.0–15.0)
Immature Granulocytes: 0 %
Lymphocytes Relative: 21 %
Lymphs Abs: 1.2 10*3/uL (ref 0.7–4.0)
MCH: 31.9 pg (ref 26.0–34.0)
MCHC: 32.1 g/dL (ref 30.0–36.0)
MCV: 99.4 fL (ref 80.0–100.0)
Monocytes Absolute: 0.4 10*3/uL (ref 0.1–1.0)
Monocytes Relative: 7 %
Neutro Abs: 3.9 10*3/uL (ref 1.7–7.7)
Neutrophils Relative %: 69 %
Platelets: 122 10*3/uL — ABNORMAL LOW (ref 150–400)
RBC: 3.6 MIL/uL — ABNORMAL LOW (ref 3.87–5.11)
RDW: 15.6 % — ABNORMAL HIGH (ref 11.5–15.5)
WBC: 5.7 10*3/uL (ref 4.0–10.5)
nRBC: 0 % (ref 0.0–0.2)

## 2022-04-28 LAB — RESP PANEL BY RT-PCR (FLU A&B, COVID) ARPGX2
Influenza A by PCR: NEGATIVE
Influenza B by PCR: NEGATIVE
SARS Coronavirus 2 by RT PCR: NEGATIVE

## 2022-04-28 LAB — COMPREHENSIVE METABOLIC PANEL
ALT: 41 U/L (ref 0–44)
AST: 35 U/L (ref 15–41)
Albumin: 3.6 g/dL (ref 3.5–5.0)
Alkaline Phosphatase: 73 U/L (ref 38–126)
Anion gap: 16 — ABNORMAL HIGH (ref 5–15)
BUN: 67 mg/dL — ABNORMAL HIGH (ref 6–20)
CO2: 25 mmol/L (ref 22–32)
Calcium: 9.4 mg/dL (ref 8.9–10.3)
Chloride: 96 mmol/L — ABNORMAL LOW (ref 98–111)
Creatinine, Ser: 8.96 mg/dL — ABNORMAL HIGH (ref 0.44–1.00)
GFR, Estimated: 6 mL/min — ABNORMAL LOW (ref >60)
Glucose, Bld: 100 mg/dL — ABNORMAL HIGH (ref 70–99)
Potassium: 4 mmol/L (ref 3.5–5.1)
Sodium: 137 mmol/L (ref 135–145)
Total Bilirubin: 1 mg/dL (ref 0.3–1.2)
Total Protein: 6.8 g/dL (ref 6.5–8.1)

## 2022-04-28 LAB — LIPASE, BLOOD: Lipase: 49 U/L (ref 11–51)

## 2022-04-28 LAB — SARS CORONAVIRUS 2 BY RT PCR: SARS Coronavirus 2 by RT PCR: NEGATIVE

## 2022-04-28 LAB — HCG, QUANTITATIVE, PREGNANCY: hCG, Beta Chain, Quant, S: 2 m[IU]/mL (ref <5)

## 2022-04-28 LAB — CBG MONITORING, ED: Glucose-Capillary: 96 mg/dL (ref 70–99)

## 2022-04-28 MED ORDER — ACETAMINOPHEN 10 MG/ML IV SOLN
1000.0000 mg | Freq: Once | INTRAVENOUS | Status: DC
  Filled 2022-04-28: qty 100

## 2022-04-28 MED ORDER — DEXAMETHASONE SODIUM PHOSPHATE 10 MG/ML IJ SOLN
10.0000 mg | Freq: Once | INTRAMUSCULAR | Status: AC
  Administered 2022-04-28: 10 mg via INTRAVENOUS
  Filled 2022-04-28: qty 1

## 2022-04-28 MED ORDER — DIPHENHYDRAMINE HCL 50 MG/ML IJ SOLN
25.0000 mg | Freq: Once | INTRAMUSCULAR | Status: AC
  Administered 2022-04-28: 25 mg via INTRAVENOUS
  Filled 2022-04-28: qty 1

## 2022-04-28 MED ORDER — ONDANSETRON 4 MG PO TBDP: 4.0000 mg | ORAL_TABLET | Freq: Three times a day (TID) | ORAL | 0 refills | Status: DC | PRN

## 2022-04-28 MED ORDER — ONDANSETRON HCL 4 MG/2ML IJ SOLN
4.0000 mg | Freq: Once | INTRAMUSCULAR | Status: AC
  Administered 2022-04-28: 4 mg via INTRAVENOUS
  Filled 2022-04-28: qty 2

## 2022-04-28 MED ORDER — PROCHLORPERAZINE EDISYLATE 10 MG/2ML IJ SOLN
10.0000 mg | Freq: Once | INTRAMUSCULAR | Status: AC
  Administered 2022-04-28: 10 mg via INTRAVENOUS
  Filled 2022-04-28: qty 2

## 2022-04-28 NOTE — Discharge Instructions (Signed)
You have been seen in the Emergency Department (ED) for a headache.  Please use Tylenol as needed for symptoms, but only as written on the box.  I have ordered for Zofran for you to take in the outpatient setting to help with your nausea and vomiting.  As we have discussed, please follow up with your primary care doctor as soon as possible, ideally within one week, regarding today's ED visit and your headache symptoms.  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  You can discuss this with your OB/GYN as needed.  Call your doctor or return to the ED if you have a worsening headache, sudden and severe headache, confusion, slurred speech, facial droop, fainting spells, weakness or numbness in any arm or leg, extreme fatigue, or other symptoms that concern you.

## 2022-04-28 NOTE — ED Triage Notes (Signed)
Pt to er, pt states that she is a tues thurs Saturday dialysis pt, states that she had dialysis on Saturday and it was normal. States that she is here because her blood sugar was low and she is vomiting.

## 2022-04-28 NOTE — ED Provider Notes (Signed)
[facility] EMERGENCY DEPARTMENT Provider Note   CSN: [id number] Arrival date & time: 04/28/22  1805     History  Chief Complaint  Patient presents with   Hypoglycemia    [name] is a 31 y.o. female.  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  Patient says she has history of migraines and headaches and has been having a pressure-like headache wrapping around her head that has been lasting over the past couple of days.  It was not thunderclap or sudden in onset.  She has been taking Tylenol at home without relief.  She has had intermittent episodes of nonbloody nonbilious emesis which according to patient and family member at bedside this is her normal.  She takes Zofran at home.  She does make some urine but not often.  Her last dialysis session was Saturday.  She has had no chest pain, shortness of breath, abdominal pain, diarrhea.  Her other associated symptoms include some mild congestion and sore throat.  Her father noted that because of her vomiting she has not been eating very much.  She is not diabetic nor she on any hypoglycemic medications but he is a diabetic and he checks her blood sugars and noted that they have been 65 at times or 60.  She sometimes feels weaker during these episodes but never unresponsive.   Hypoglycemia      Home Medications Prior to Admission medications   Medication Sig Start Date End Date Taking? Authorizing Provider  acetaminophen (TYLENOL) 500 MG tablet Take 1,000 mg by mouth every 6 (six) hours as needed for mild pain or headache.   Yes [provider]  AURYXIA 1 GM 210 MG(Fe) tablet Take 630 mg by mouth 3 (three) times daily with meals. 04/15/18  Yes [provider]  B Complex-C-Folic Acid (DIALYVITE TABLET) TABS Take 1 tablet by mouth daily.   Yes [provider]  diphenhydrAMINE (BENADRYL) 25  mg capsule Take 25 mg by mouth daily as needed for allergies.   Yes [provider]  Doxercalciferol (HECTOROL IV) Given at dialysis 01/07/22 01/06/23 Yes [provider]  Epoetin Alfa (EPOGEN IJ) Inject as directed See admin instructions. Every Tuesday, Thursday, Saturday   Yes [provider]  hydrALAZINE (APRESOLINE) 25 MG tablet Take 25 mg by mouth 3 (three) times daily. 04/28/20  Yes [provider]  Methoxy PEG-Epoetin Beta (MIRCERA IJ) Mircera 04/17/22 04/16/23 Yes [provider]  metoprolol succinate (TOPROL-XL) 100 MG 24 hr tablet Take 100 mg by mouth daily. 12/29/19  Yes [provider]  omeprazole (PRILOSEC) 40 MG capsule Take 40 mg by mouth every morning. 01/08/22  Yes [provider]  ondansetron (ZOFRAN) 4 MG tablet Take 1 tablet (4 mg total) by mouth every 6 (six) hours as needed for nausea or vomiting. 04/04/21  Yes Marcello Fennel, PA-C  ondansetron (ZOFRAN-ODT) 4 MG disintegrating tablet Take 1 tablet (4 mg total) by mouth every 8 (eight) hours as needed for nausea or vomiting. 04/28/22  Yes Elgie Congo, MD  sodium zirconium cyclosilicate (LOKELMA) 5 g packet Take 10 g by mouth every Monday, Wednesday, and Friday. 07/20/21  Yes [provider]      Allergies    Ciprofloxacin; Other; Peanut-containing drug products; Aleve [naproxen sodium]; Ceftriaxone; Coconut (cocos nucifera); Influenza vaccines; Tetanus toxoid, adsorbed; Tetanus toxoids; and Latex    Review of Systems  Review of Systems  Physical Exam Updated Vital Signs BP (!) 161/94   Pulse 94   Temp 97.8 F (36.6 C) (Oral)   Resp (!) 23   SpO2 99%  Physical Exam Constitutional: Alert and oriented.Pleasant female in bed slightly uncomfortable but NAD Eyes: Conjunctivae are normal. + Photophobia bilaterally ENT      Head: Normocephalic and atraumatic.      Nose: No congestion.      Mouth/Throat: Mucous membranes are moist.      Neck: No  stridor. No meningismus Cardiovascular: S1, S2, RRR Respiratory: Normal respiratory effort. Breath sounds are normal. Gastrointestinal: Soft and nontender.  Musculoskeletal: Contractures of b/l LE Neurologic: Normal speech and language without aphasia. AAOx4. PERRL. EOMI without nystagmus. Face symmetrical without droop. CN II-XII intact. Normal facial sensation. Tongue midline. 5/5 strength in upperextremities.Contractures LEs. Normal sensation to light touch in all extremities.  Skin: Skin is warm, dry and intact. No rash noted. Psychiatric: Mood and affect are normal. Speech and behavior are normal.  ED Results / Procedures / Treatments   Labs (all labs ordered are listed, but only abnormal results are displayed) Labs Reviewed  COMPREHENSIVE METABOLIC PANEL - Abnormal; Notable for the following components:      Result Value   Chloride 96 (*)    Glucose, Bld 100 (*)    BUN 67 (*)    Creatinine, Ser 8.96 (*)    GFR, Estimated 6 (*)    Anion gap 16 (*)    All other components within normal limits  CBC WITH DIFFERENTIAL/PLATELET - Abnormal; Notable for the following components:   RBC 3.60 (*)    Hemoglobin 11.5 (*)    HCT 35.8 (*)    RDW 15.6 (*)    Platelets 122 (*)    All other components within normal limits  SARS CORONAVIRUS 2 BY RT PCR  RESP PANEL BY RT-PCR (FLU A&B, COVID) ARPGX2  LIPASE, BLOOD  HCG, QUANTITATIVE, PREGNANCY  CBG MONITORING, ED    EKG EKG Interpretation  Date/Time:  Monday April 28 2022 18:42:53 EDT Ventricular Rate:  80 PR Interval:  131 QRS Duration: 80 QT Interval:  403 QTC Calculation: 465 R Axis:   95 Text Interpretation: Sinus rhythm Borderline right axis deviation Confirmed by Georgina Snell [id number]) on 04/28/2022 6:45:00 PM  Radiology CT ABDOMEN PELVIS WO CONTRAST  Result Date: 04/28/2022 CLINICAL DATA:  Abdominal pain, headache, low blood sugar and vomiting. EXAM: CT ABDOMEN AND PELVIS WITHOUT CONTRAST TECHNIQUE: Multidetector CT  imaging of the abdomen and pelvis was performed following the standard protocol without IV contrast. RADIATION DOSE REDUCTION: This exam was performed according to the departmental dose-optimization program which includes automated exposure control, adjustment of the mA and/or kV according to patient size and/or use of iterative reconstruction technique. COMPARISON:  February 08, 2022 FINDINGS: Lower chest: No acute abnormality. Hepatobiliary: No focal liver abnormality is seen. No gallstones, gallbladder wall thickening, or biliary dilatation. Pancreas: Unremarkable. No pancreatic ductal dilatation or surrounding inflammatory changes. Spleen: Normal in size without focal abnormality. Adrenals/Urinary Tract: Adrenal glands are unremarkable. An enlarged left kidney is again seen (best seen on coronal reformatted images 58 through 74, CT series 6). Prominent intrarenal calices are again seen. This is decreased in severity when compared to the prior study. 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 The right kidney is not identified. The urinary bladder is moderately distended. Stable moderate severity diffuse urinary bladder wall thickening is seen. A markedly dilated distal left ureter and dilated left  UVJ are again seen. Stomach/Bowel: Stomach is within normal limits. Appendix appears normal. No evidence of bowel wall thickening, distention, or inflammatory changes (limited in evaluation in the absence of oral contrast). Vascular/Lymphatic: No significant vascular findings are present. No enlarged abdominal or pelvic lymph nodes (limited in evaluation in the absence of intravenous contrast). Reproductive: A stable 7.9 cm x 7.8 cm left ovarian dermoid is seen. Other: No abdominal wall hernia or abnormality. No abdominopelvic ascites. 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  IMPRESSION: 1. Stable, chronic, marked severity left-sided hydroureter with stable moderate severity diffuse urinary bladder wall thickening. 2. Absent right kidney. 3. 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 4. 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 Electronically Signed   By: Virgina Norfolk M.D.   On: 04/28/2022 21:03   CT Head Wo Contrast  Result Date: 04/28/2022 CLINICAL DATA:  Headaches EXAM: CT HEAD WITHOUT CONTRAST TECHNIQUE: Contiguous axial images were obtained from the base of the skull through the vertex without intravenous contrast. RADIATION DOSE REDUCTION: This exam was performed according to the departmental dose-optimization program which includes automated exposure control, adjustment of the mA and/or kV according to patient size and/or use of iterative reconstruction technique. COMPARISON:  02/08/2022 FINDINGS: Brain: No acute intracranial findings are seen. There are no signs of bleeding within the cranium. Ventricles are not dilated. Scattered dural calcifications are noted, more so in tentorium with no significant interval change. There is no focal edema or mass effect. Vascular: Unremarkable. Skull: Unremarkable. Sinuses/Orbits: Unremarkable. Other: None. IMPRESSION: No acute intracranial findings are seen in noncontrast CT brain. Electronically Signed   By: Elmer Picker M.D.   On: 04/28/2022 20:51    Procedures Procedures  Remained on constant cardiac monitoring, NSR  Medications Ordered in ED Medications  ondansetron (ZOFRAN) injection 4 mg (4 mg Intravenous Given 04/28/22 1954)  dexamethasone (DECADRON) injection 10 mg (10 mg Intravenous Given 04/28/22 1954)  diphenhydrAMINE (BENADRYL) injection 25 mg (25 mg Intravenous Given 04/28/22 1954)  prochlorperazine (COMPAZINE) injection 10 mg (10 mg Intravenous Given 04/28/22 1954)    ED Course/ Medical Decision Making/ A&P                            Medical Decision Making [name] is a 31 y.o. female.  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  Regarding the patient's headache, I suspect it is most consistent with primary headache in nature such as tension or migraine headache. I do not think CVA as the patient has no change from baseline neurologic exam (contractures LE and weakin b/l LE but otherwise no new or focal change). Additionally, I do not think SAH as the patient did not have severe onset of pain at beginning of headache, they do not have focal neurologic deficits, and are overall non-toxic in appearance. I do not suspect meningitis with no illness symptoms, no rash, nontoxic appearance, and no meningismus on exam.  CT head was obtained which also showed no ICH or acute intracranial abnormality.  Regarding vomiting, CT scan of the abdomen was obtained without contrast which showed stable persistent dermoid lesion of ovary.  She had a normal  white blood cell count 5.7.  Stable anemia hemoglobin 11.5.  No transaminitis or elevated lipase, no concern for biliary pathology especially with reassuring CT scan and no upper abdominal pain.  Her glucose was 100 no hypoglycemia.  Normal sodium and potassium, do not suspect adrenal insufficiency.  She was given migraine cocktail with Decadron, Compazine, Benadryl and Tylenol and had relief of symptoms and tolerating p.o.  Safe for discharge with follow-up with PCP and advised follow-up with OB/GYN for dermoid ovarian lesion.  Strict return precaution discussed.  Family in agreement with plan.   Amount and/or Complexity of Data Reviewed Labs: ordered. Radiology: ordered.  Risk Prescription drug management.   Final Clinical Impression(s) / ED Diagnoses Final diagnoses:  Bad headache  Nausea and vomiting, unspecified vomiting type  Cyst, ovary,  dermoid, left    Rx / DC Orders ED Discharge Orders          Ordered    ondansetron (ZOFRAN-ODT) 4 MG disintegrating tablet  Every 8 hours PRN        04/28/22 2145              Elgie Congo, MD 04/29/22 1031

## 2022-04-29 DIAGNOSIS — Z992 Dependence on renal dialysis: Secondary | ICD-10-CM | POA: Diagnosis not present

## 2022-04-29 DIAGNOSIS — N186 End stage renal disease: Secondary | ICD-10-CM | POA: Diagnosis not present

## 2022-04-29 DIAGNOSIS — N2581 Secondary hyperparathyroidism of renal origin: Secondary | ICD-10-CM | POA: Diagnosis not present

## 2022-04-30 ENCOUNTER — Ambulatory Visit (INDEPENDENT_AMBULATORY_CARE_PROVIDER_SITE_OTHER): Payer: Medicare Other | Admitting: Family Medicine

## 2022-04-30 VITALS — BP 145/82 | HR 86 | Temp 98.5°F | Ht <= 58 in | Wt <= 1120 oz

## 2022-04-30 DIAGNOSIS — R011 Cardiac murmur, unspecified: Secondary | ICD-10-CM | POA: Diagnosis not present

## 2022-04-30 DIAGNOSIS — R002 Palpitations: Secondary | ICD-10-CM | POA: Diagnosis not present

## 2022-04-30 DIAGNOSIS — R6889 Other general symptoms and signs: Secondary | ICD-10-CM | POA: Diagnosis not present

## 2022-04-30 DIAGNOSIS — I10 Essential (primary) hypertension: Secondary | ICD-10-CM | POA: Diagnosis not present

## 2022-04-30 DIAGNOSIS — G43E09 Chronic migraine with aura, not intractable, without status migrainosus: Secondary | ICD-10-CM

## 2022-04-30 DIAGNOSIS — N186 End stage renal disease: Secondary | ICD-10-CM

## 2022-04-30 DIAGNOSIS — Q057 Lumbar spina bifida without hydrocephalus: Secondary | ICD-10-CM

## 2022-04-30 DIAGNOSIS — Z0000 Encounter for general adult medical examination without abnormal findings: Secondary | ICD-10-CM

## 2022-04-30 DIAGNOSIS — E162 Hypoglycemia, unspecified: Secondary | ICD-10-CM | POA: Diagnosis not present

## 2022-04-30 DIAGNOSIS — R7301 Impaired fasting glucose: Secondary | ICD-10-CM

## 2022-04-30 DIAGNOSIS — Z7689 Persons encountering health services in other specified circumstances: Secondary | ICD-10-CM

## 2022-04-30 LAB — GLUCOSE 16585: Glucose: 121 mg/dL — ABNORMAL HIGH (ref 65–99)

## 2022-04-30 NOTE — Progress Notes (Unsigned)
New Patient Office Visit  Subjective    Patient ID: [name], female    DOB: [birth year]  Age: 31 y.o. MRN: [id number]  CC:  Chief Complaint  Patient presents with   Establish Care    HPI [name] presents to establish care. She has ESRD and gets dialysis T/Th/Sa. Previously seeing [facility] clinic for primary care, unsure last physical. Nephrologist is at dialysis clinic. BP at home usually 140/90, she is compliant with metoprolol '100mg'$  daily and hydralazine 25 mg three times daily for one year without side effects. She eats a regular diet. No exercise regimen at this time. She has never had a PAP smear due to her anatomy per her previous OB. She smokes marijuana daily, denies tobacco and alcohol. She is not sexually active and denies periods.   She reports headaches 4 times a week, throbbing, photosensitivity, forehead, they are intermittent, helped by tylenol and sleep, precipitating factors include bright lights, associated with nausea, floaters, they usually last up to 24h, previously tried a medication prescribed by [facility] in [place] but caused her to feel "drunk" and she couldn't function. Recommended botox injections, she would like a neurology referral for evaluation.   Outpatient Encounter Medications as of 04/30/2022  Medication Sig   acetaminophen (TYLENOL) 500 MG tablet Take 1,000 mg by mouth every 6 (six) hours as needed for mild pain or headache.   AURYXIA 1 GM 210 MG(Fe) tablet Take 630 mg by mouth 3 (three) times daily with meals.   B Complex-C-Folic Acid (DIALYVITE TABLET) TABS Take 1 tablet by mouth daily.   diphenhydrAMINE (BENADRYL) 25 mg capsule Take 25 mg by mouth daily as needed for allergies.   Doxercalciferol (HECTOROL IV) Given at dialysis   Epoetin Alfa (EPOGEN IJ) Inject as directed See admin instructions. Every Tuesday, Thursday, Saturday   hydrALAZINE (APRESOLINE) 25 MG tablet Take 25 mg by mouth 3 (three) times daily.    Methoxy PEG-Epoetin Beta (MIRCERA IJ) Mircera   metoprolol succinate (TOPROL-XL) 100 MG 24 hr tablet Take 100 mg by mouth daily.   omeprazole (PRILOSEC) 40 MG capsule Take 40 mg by mouth every morning.   ondansetron (ZOFRAN) 4 MG tablet Take 1 tablet (4 mg total) by mouth every 6 (six) hours as needed for nausea or vomiting.   ondansetron (ZOFRAN-ODT) 4 MG disintegrating tablet Take 1 tablet (4 mg total) by mouth every 8 (eight) hours as needed for nausea or vomiting.   sodium zirconium cyclosilicate (LOKELMA) 5 g packet Take 10 g by mouth every Monday, Wednesday, and Friday.   No facility-administered encounter medications on file as of 04/30/2022.    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   Depression 05/14/2015   Dialysis care    ESRD (end stage renal disease) on dialysis [facility])    GERD (gastroesophageal reflux disease) 01/07/2017   HTN (hypertension) 05/02/2011   Spina bifida    UTI (lower urinary tract infection)     Past Surgical History:  Procedure Laterality Date   A/V FISTULAGRAM Left 10/14/2021   Procedure: A/V Fistulagram;  Surgeon: [name], MD;  Location: [facility];  Service: Cardiovascular;  Laterality: Left;   AV FISTULA PLACEMENT     left arm   INSERTION OF DIALYSIS CATHETER N/A 05/08/2020   Procedure: ATTEMPTED, UNSUCCESSFUL INSERTION OF DIALYSIS CATHETER TUNNELED RIGHT  INTERNAL JUGULAR;  Surgeon: Virl Cagey, MD;  Location: [facility];  Service: General;  Laterality: N/A;   IR FLUORO GUIDE CV LINE RIGHT  05/09/2020   IR US GUIDE VASC ACCESS RIGHT  05/09/2020    Family History  Problem Relation Age of Onset   Kidney cancer Other    Hypertension Maternal Grandmother    Arthritis Maternal Grandmother    Breast cancer Maternal Aunt    Colon cancer Neg Hx     Social History   Socioeconomic History   Marital  status: Single    Spouse name: Not on file   Number of children: Not on file   Years of education: Not on file   Highest education level: Not on file  Occupational History   Not on file  Tobacco Use   Smoking status: Every Day    Packs/day: 0.10    Types: Cigarettes   Smokeless tobacco: Never   Tobacco comments:    2 cigs a day  Substance and Sexual Activity   Alcohol use: No   Drug use: No   Sexual activity: Never  Other Topics Concern   Not on file  Social History Narrative   Not on file   Social Determinants of Health   Financial Resource Strain: Not on file  Food Insecurity: No Food Insecurity (03/12/2022)   Hunger Vital Sign    Worried About Running Out of Food in the Last Year: Never true    Ran Out of Food in the Last Year: Never true  Transportation Needs: Not on file  Physical Activity: Not on file  Stress: Not on file  Social Connections: Not on file  Intimate Partner Violence: Not on file      04/30/2022   11:16 AM 04/30/2022    9:57 AM 04/28/2022    9:57 PM  Vitals with BMI  Height  '1\' 0"'$    Weight  60 lbs   BMI  258.52   Systolic 778 242 353  Diastolic 82 70 94  Pulse  86 94     Review of Systems  Constitutional: Negative.   HENT: Negative.    Eyes: Negative.   Respiratory: Negative.    Cardiovascular: Negative.   Gastrointestinal:  Positive for nausea and vomiting.  Genitourinary: Negative.   Musculoskeletal: Negative.   Skin: Negative.   Neurological:  Positive for headaches.  Endo/Heme/Allergies: Negative.   Psychiatric/Behavioral:  Positive for depression. Negative for suicidal ideas. The patient is nervous/anxious.         Objective    BP (!) 145/82   Pulse 86   Temp 98.5 F (36.9 C) (Oral)   Ht 1' (0.305 m)   Wt 60 lb (27.2 kg)   SpO2 97%   BMI 292.95 kg/m   Physical Exam Constitutional:      Appearance: Normal appearance.  Cardiovascular:     Rate and Rhythm: Normal rate and regular rhythm.     Heart sounds: Murmur  heard.  Pulmonary:     Effort: Pulmonary effort is normal.     Breath sounds: Normal breath sounds.  Musculoskeletal:        General: Deformity (bilateral lower extremity) present.  Neurological:     General: No focal deficit present.     Mental Status: She is alert and oriented to person, place, and time.  Psychiatric:        Mood and Affect: Mood normal.        Behavior: Behavior normal.  Thought Content: Thought content normal.        Judgment: Judgment normal.      Assessment & Plan:   Encounter to establish care with new doctor  Chronic migraine with aura without status migrainosus, not intractable - Plan: Ambulatory referral to Neurology Previously failed medication management and would like to try botox injections as recommended by [facility] headache clinic. Will refer to neurology for further management.  Heat intolerance - Plan: TSH  Palpitations - Plan: TSH  Spina bifida of lumbosacral region, unspecified hydrocephalus presence ([place])  ESRD (end stage renal disease) ([place]) Dialysis Tuesday, Thursday, and Saturday. Followed by Nephrology, awaiting records.  Primary hypertension - Plan: Lipid panel BP 140/70 at today's visit. Will DC Metoprolol '100mg'$  daily and start Coreg '25mg'$  daily.  Elevated fasting glucose - Plan: Hemoglobin A1c  Murmur, cardiac - Plan: ECHOCARDIOGRAM COMPLETE  Low blood sugar - Plan: Glucose, fingerstick (stat)  Routine adult health maintenance    No follow-ups on file.   Rubie Maid, FNP

## 2022-05-01 ENCOUNTER — Encounter: Payer: Self-pay | Admitting: Family Medicine

## 2022-05-01 DIAGNOSIS — N186 End stage renal disease: Secondary | ICD-10-CM | POA: Diagnosis not present

## 2022-05-01 DIAGNOSIS — Z992 Dependence on renal dialysis: Secondary | ICD-10-CM | POA: Diagnosis not present

## 2022-05-01 DIAGNOSIS — N2581 Secondary hyperparathyroidism of renal origin: Secondary | ICD-10-CM | POA: Diagnosis not present

## 2022-05-01 LAB — LIPID PANEL
Cholesterol: 144 mg/dL (ref <200)
HDL: 35 mg/dL — ABNORMAL LOW (ref >=50)
LDL Cholesterol (Calc): 85 mg/dL (calc)
Non-HDL Cholesterol (Calc): 109 mg/dL (calc) (ref <130)
Total CHOL/HDL Ratio: 4.1 (calc) (ref <5.0)
Triglycerides: 137 mg/dL (ref <150)

## 2022-05-01 LAB — HEMOGLOBIN A1C
Hgb A1c MFr Bld: 5 % of total Hgb (ref <5.7)
Mean Plasma Glucose: 97 mg/dL
eAG (mmol/L): 5.4 mmol/L

## 2022-05-01 LAB — TSH: TSH: 1.95 mIU/L

## 2022-05-03 DIAGNOSIS — N186 End stage renal disease: Secondary | ICD-10-CM | POA: Diagnosis not present

## 2022-05-03 DIAGNOSIS — Z992 Dependence on renal dialysis: Secondary | ICD-10-CM | POA: Diagnosis not present

## 2022-05-03 DIAGNOSIS — N2581 Secondary hyperparathyroidism of renal origin: Secondary | ICD-10-CM | POA: Diagnosis not present

## 2022-05-06 DIAGNOSIS — N2581 Secondary hyperparathyroidism of renal origin: Secondary | ICD-10-CM | POA: Diagnosis not present

## 2022-05-06 DIAGNOSIS — Z992 Dependence on renal dialysis: Secondary | ICD-10-CM | POA: Diagnosis not present

## 2022-05-06 DIAGNOSIS — N186 End stage renal disease: Secondary | ICD-10-CM | POA: Diagnosis not present

## 2022-05-08 DIAGNOSIS — N2581 Secondary hyperparathyroidism of renal origin: Secondary | ICD-10-CM | POA: Diagnosis not present

## 2022-05-08 DIAGNOSIS — N186 End stage renal disease: Secondary | ICD-10-CM | POA: Diagnosis not present

## 2022-05-08 DIAGNOSIS — Z992 Dependence on renal dialysis: Secondary | ICD-10-CM | POA: Diagnosis not present

## 2022-05-09 ENCOUNTER — Encounter: Payer: Self-pay | Admitting: Neurology

## 2022-05-10 DIAGNOSIS — N2581 Secondary hyperparathyroidism of renal origin: Secondary | ICD-10-CM | POA: Diagnosis not present

## 2022-05-10 DIAGNOSIS — Z992 Dependence on renal dialysis: Secondary | ICD-10-CM | POA: Diagnosis not present

## 2022-05-10 DIAGNOSIS — N186 End stage renal disease: Secondary | ICD-10-CM | POA: Diagnosis not present

## 2022-05-13 ENCOUNTER — Encounter: Payer: Self-pay | Admitting: Family Medicine

## 2022-05-13 ENCOUNTER — Ambulatory Visit (INDEPENDENT_AMBULATORY_CARE_PROVIDER_SITE_OTHER): Payer: Medicare Other | Admitting: Family Medicine

## 2022-05-13 VITALS — BP 120/86 | HR 79 | Temp 98.4°F | Ht <= 58 in | Wt <= 1120 oz

## 2022-05-13 DIAGNOSIS — Z992 Dependence on renal dialysis: Secondary | ICD-10-CM | POA: Diagnosis not present

## 2022-05-13 DIAGNOSIS — N2581 Secondary hyperparathyroidism of renal origin: Secondary | ICD-10-CM | POA: Diagnosis not present

## 2022-05-13 DIAGNOSIS — E162 Hypoglycemia, unspecified: Secondary | ICD-10-CM | POA: Diagnosis not present

## 2022-05-13 DIAGNOSIS — N186 End stage renal disease: Secondary | ICD-10-CM | POA: Diagnosis not present

## 2022-05-13 LAB — GLUCOSE 16585: Glucose: 97 mg/dL (ref 65–99)

## 2022-05-13 NOTE — Progress Notes (Signed)
Acute Office Visit  Subjective:     Patient ID: [name], female    DOB: 1991/03/01, 31 y.o.   MRN: [id number]  Chief Complaint  Patient presents with   Follow-up    issues with low blood sugar (dialysis provider)    [name] is here today for concerns of low blood sugar. She is asymptomatic when her blood sugar is less than 60. She checks at home when she starts sweating, nausea, palpitations, shortness of breath and her range is 60-54 during those times. This has happened 5 times in the last week. Her diet is normal, non-fasting, but she vomits almost every day. When it is low she consumes a spoonful of sugar and it resolves. She reports snacking overnight, if she does not snack she becomes hypoglycemic. She eats oatmeal for breakfast, burgers, pancakes with sausage, chicken and rice.   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   Depression 05/14/2015   Dialysis care    ESRD (end stage renal disease) on dialysis [facility])    GERD (gastroesophageal reflux disease) 01/07/2017   HTN (hypertension) 05/02/2011   Spina bifida    UTI (lower urinary tract infection)    Past Surgical History:  Procedure Laterality Date   A/V FISTULAGRAM Left 10/14/2021   Procedure: A/V Fistulagram;  Surgeon: [name], MD;  Location: [facility];  Service: Cardiovascular;  Laterality: Left;   AV FISTULA PLACEMENT     left arm   INSERTION OF DIALYSIS CATHETER N/A 05/08/2020   Procedure: ATTEMPTED, UNSUCCESSFUL INSERTION OF DIALYSIS CATHETER TUNNELED RIGHT INTERNAL JUGULAR;  Surgeon: Virl Cagey, MD;  Location: [facility];  Service: General;  Laterality: N/A;   IR FLUORO GUIDE CV LINE RIGHT  05/09/2020   IR US GUIDE VASC ACCESS RIGHT  05/09/2020   Current Outpatient Medications on File Prior to Visit  Medication Sig Dispense Refill   acetaminophen  (TYLENOL) 500 MG tablet Take 1,000 mg by mouth every 6 (six) hours as needed for mild pain or headache.     AURYXIA 1 GM 210 MG(Fe) tablet Take 630 mg by mouth 3 (three) times daily with meals.  3   B Complex-C-Folic Acid (DIALYVITE TABLET) TABS Take 1 tablet by mouth daily.     diphenhydrAMINE (BENADRYL) 25 mg capsule Take 25 mg by mouth daily as needed for allergies.     Doxercalciferol (HECTOROL IV) Given at dialysis     Epoetin Alfa (EPOGEN IJ) Inject as directed See admin instructions. Every Tuesday, Thursday, Saturday     hydrALAZINE (APRESOLINE) 25 MG tablet Take 25 mg by mouth 3 (three) times daily.     Methoxy PEG-Epoetin Beta (MIRCERA IJ) Mircera     metoprolol succinate (TOPROL-XL) 100 MG 24 hr tablet Take 100 mg by mouth daily.     omeprazole (PRILOSEC) 40 MG capsule Take 40 mg by mouth every morning.     ondansetron (ZOFRAN) 4 MG tablet Take 1 tablet (4 mg total) by mouth every 6 (six) hours as needed for nausea or vomiting. 12 tablet 0   ondansetron (ZOFRAN-ODT) 4 MG disintegrating tablet Take 1 tablet (4 mg total) by mouth every 8 (eight) hours as needed for nausea or vomiting. 20 tablet 0   sodium zirconium cyclosilicate (LOKELMA) 5 g packet Take 10 g by mouth every Monday, Wednesday, and Friday.  No current facility-administered medications on file prior to visit.   Allergies  Allergen Reactions   Ciprofloxacin Shortness Of Breath, Nausea And Vomiting and Other (See Comments)    HIGH FEVER and oral blisters    Other Anaphylaxis    Revaclear dialzer   Peanut-Containing Drug Products Anaphylaxis   Aleve [Naproxen Sodium] Other (See Comments)    G.I.Bleed   Ceftriaxone Other (See Comments)    Blisters in mouth    Coconut (Cocos Nucifera) Hives   Influenza Vaccines Nausea And Vomiting and Other (See Comments)    High fever   Tetanus Toxoid, Adsorbed Nausea And Vomiting and Other (See Comments)    HIGH FEVER, also   Tetanus Toxoids Nausea And Vomiting and Other (See  Comments)    HIGH FEVER   Latex Itching and Rash     Review of Systems  Constitutional:  Positive for diaphoresis.  HENT: Negative.    Eyes: Negative.   Respiratory: Negative.    Cardiovascular: Negative.   Neurological: Negative.   Psychiatric/Behavioral: Negative.          Objective:    BP 120/86   Pulse 79   Temp 98.4 F (36.9 C) (Oral)   Ht 1' (0.305 m)   Wt 64 lb (29 kg)   SpO2 99%   BMI 312.48 kg/m    Physical Exam Vitals and nursing note reviewed.  Constitutional:      Appearance: Normal appearance.  Skin:    General: Skin is warm and dry.  Neurological:     General: No focal deficit present.     Mental Status: She is alert.  Psychiatric:        Mood and Affect: Mood normal.        Behavior: Behavior normal.        Thought Content: Thought content normal.        Judgment: Judgment normal.     Results for orders placed or performed in visit on 05/13/22  GLUCOSE [id number]  Result Value Ref Range   Glucose 97 65 - 99 mg/dL        Assessment & Plan:   1. Hypoglycemia without diagnosis of diabetes mellitus I am unsure of the etiology of her hypoglycemic episodes. We discussed the importance of protein and carb balance to prevent blood sugar crashes. Will evaluate for insulinoma if able to obtain fasting hypoglycemic labs. Discussed the signs and management of hypoglycemic episodes. She has means to check her blood glucose when she becomes hypoglycemic. Call 911 if unable to attain glucose control.  - Glucose, Random; Future - Proinsulin; Future - Beta-Hydroxybutyrate; Future - C-peptide; Future - Insulin, Free (Bioactive); Future - Glucose, fingerstick (stat) - GLUCOSE 36144   Return in about 1 day (around 05/14/2022) for fasting labs.  Rubie Maid, FNP

## 2022-05-13 NOTE — Addendum Note (Signed)
Addended by: Rubie Maid on: 05/13/2022 12:18 PM   Modules accepted: Level of Service

## 2022-05-14 ENCOUNTER — Other Ambulatory Visit: Payer: Medicare Other

## 2022-05-14 ENCOUNTER — Ambulatory Visit: Payer: Medicare Other | Admitting: Family Medicine

## 2022-05-14 DIAGNOSIS — E162 Hypoglycemia, unspecified: Secondary | ICD-10-CM | POA: Diagnosis not present

## 2022-05-14 LAB — GLUCOSE 16585: Glucose: 84 mg/dL (ref 65–99)

## 2022-05-14 NOTE — Addendum Note (Signed)
Addended by: Rubie Maid on: 05/14/2022 09:50 AM   Modules accepted: Orders

## 2022-05-15 DIAGNOSIS — Z992 Dependence on renal dialysis: Secondary | ICD-10-CM | POA: Diagnosis not present

## 2022-05-15 DIAGNOSIS — N186 End stage renal disease: Secondary | ICD-10-CM | POA: Diagnosis not present

## 2022-05-15 DIAGNOSIS — E039 Hypothyroidism, unspecified: Secondary | ICD-10-CM | POA: Diagnosis not present

## 2022-05-15 DIAGNOSIS — N2581 Secondary hyperparathyroidism of renal origin: Secondary | ICD-10-CM | POA: Diagnosis not present

## 2022-05-15 LAB — GLUCOSE, RANDOM: Glucose, Plasma: 74 mg/dL (ref 65–139)

## 2022-05-17 DIAGNOSIS — N2581 Secondary hyperparathyroidism of renal origin: Secondary | ICD-10-CM | POA: Diagnosis not present

## 2022-05-17 DIAGNOSIS — Z992 Dependence on renal dialysis: Secondary | ICD-10-CM | POA: Diagnosis not present

## 2022-05-17 DIAGNOSIS — N186 End stage renal disease: Secondary | ICD-10-CM | POA: Diagnosis not present

## 2022-05-19 ENCOUNTER — Ambulatory Visit: Payer: Self-pay

## 2022-05-19 NOTE — Patient Outreach (Signed)
  Care Coordination   05/19/2022 Name: BRESLIN BURKLOW MRN: [id number] DOB: 08-04-1990   Care Coordination Outreach Attempts:  An unsuccessful telephone outreach was attempted for a scheduled appointment today.  Follow Up Plan:  Additional outreach attempts will be made to offer the patient care coordination information and services.   Encounter Outcome:  No Answer  Care Coordination Interventions Activated:  No   Care Coordination Interventions:  No, not indicated    Barb Merino, RN, BSN, CCM Care Management Coordinator [facility] Care Management  Direct Phone: [phone]

## 2022-05-20 DIAGNOSIS — Z992 Dependence on renal dialysis: Secondary | ICD-10-CM | POA: Diagnosis not present

## 2022-05-20 DIAGNOSIS — N2581 Secondary hyperparathyroidism of renal origin: Secondary | ICD-10-CM | POA: Diagnosis not present

## 2022-05-20 DIAGNOSIS — N186 End stage renal disease: Secondary | ICD-10-CM | POA: Diagnosis not present

## 2022-05-21 LAB — INSULIN, FREE (BIOACTIVE): Insulin, Free: 2.3 u[IU]/mL (ref 1.5–14.9)

## 2022-05-21 LAB — C-PEPTIDE: C-Peptide: 8.06 ng/mL — ABNORMAL HIGH (ref 0.80–3.85)

## 2022-05-21 LAB — PROINSULIN: Proinsulin: 6.4 pmol/L (ref <=18.8)

## 2022-05-22 DIAGNOSIS — Z992 Dependence on renal dialysis: Secondary | ICD-10-CM | POA: Diagnosis not present

## 2022-05-22 DIAGNOSIS — N186 End stage renal disease: Secondary | ICD-10-CM | POA: Diagnosis not present

## 2022-05-22 DIAGNOSIS — N2581 Secondary hyperparathyroidism of renal origin: Secondary | ICD-10-CM | POA: Diagnosis not present

## 2022-05-24 DIAGNOSIS — Z992 Dependence on renal dialysis: Secondary | ICD-10-CM | POA: Diagnosis not present

## 2022-05-24 DIAGNOSIS — N186 End stage renal disease: Secondary | ICD-10-CM | POA: Diagnosis not present

## 2022-05-24 DIAGNOSIS — N2581 Secondary hyperparathyroidism of renal origin: Secondary | ICD-10-CM | POA: Diagnosis not present

## 2022-05-27 DIAGNOSIS — N186 End stage renal disease: Secondary | ICD-10-CM | POA: Diagnosis not present

## 2022-05-27 DIAGNOSIS — Z992 Dependence on renal dialysis: Secondary | ICD-10-CM | POA: Diagnosis not present

## 2022-05-27 DIAGNOSIS — N2581 Secondary hyperparathyroidism of renal origin: Secondary | ICD-10-CM | POA: Diagnosis not present

## 2022-05-28 DIAGNOSIS — I120 Hypertensive chronic kidney disease with stage 5 chronic kidney disease or end stage renal disease: Secondary | ICD-10-CM | POA: Diagnosis not present

## 2022-05-28 DIAGNOSIS — N186 End stage renal disease: Secondary | ICD-10-CM | POA: Diagnosis not present

## 2022-05-28 DIAGNOSIS — Z992 Dependence on renal dialysis: Secondary | ICD-10-CM | POA: Diagnosis not present

## 2022-05-29 DIAGNOSIS — Z992 Dependence on renal dialysis: Secondary | ICD-10-CM | POA: Diagnosis not present

## 2022-05-29 DIAGNOSIS — N186 End stage renal disease: Secondary | ICD-10-CM | POA: Diagnosis not present

## 2022-05-29 DIAGNOSIS — N2581 Secondary hyperparathyroidism of renal origin: Secondary | ICD-10-CM | POA: Diagnosis not present

## 2022-05-31 DIAGNOSIS — N186 End stage renal disease: Secondary | ICD-10-CM | POA: Diagnosis not present

## 2022-05-31 DIAGNOSIS — N2581 Secondary hyperparathyroidism of renal origin: Secondary | ICD-10-CM | POA: Diagnosis not present

## 2022-05-31 DIAGNOSIS — Z992 Dependence on renal dialysis: Secondary | ICD-10-CM | POA: Diagnosis not present

## 2022-06-03 ENCOUNTER — Emergency Department (HOSPITAL_COMMUNITY): Payer: Medicare Other

## 2022-06-03 ENCOUNTER — Encounter (HOSPITAL_COMMUNITY): Payer: Self-pay | Admitting: Emergency Medicine

## 2022-06-03 ENCOUNTER — Emergency Department (HOSPITAL_COMMUNITY)
Admission: EM | Admit: 2022-06-03 | Discharge: 2022-06-03 | Disposition: A | Discharge status: Home / Self Care | Payer: Medicare Other | Attending: Student | Admitting: Student

## 2022-06-03 ENCOUNTER — Other Ambulatory Visit: Payer: Self-pay

## 2022-06-03 DIAGNOSIS — R079 Chest pain, unspecified: Secondary | ICD-10-CM | POA: Diagnosis not present

## 2022-06-03 DIAGNOSIS — I499 Cardiac arrhythmia, unspecified: Secondary | ICD-10-CM | POA: Diagnosis not present

## 2022-06-03 DIAGNOSIS — E11649 Type 2 diabetes mellitus with hypoglycemia without coma: Secondary | ICD-10-CM | POA: Diagnosis not present

## 2022-06-03 DIAGNOSIS — E1122 Type 2 diabetes mellitus with diabetic chronic kidney disease: Secondary | ICD-10-CM | POA: Insufficient documentation

## 2022-06-03 DIAGNOSIS — R52 Pain, unspecified: Secondary | ICD-10-CM | POA: Diagnosis not present

## 2022-06-03 DIAGNOSIS — Z992 Dependence on renal dialysis: Secondary | ICD-10-CM | POA: Insufficient documentation

## 2022-06-03 DIAGNOSIS — N186 End stage renal disease: Secondary | ICD-10-CM | POA: Insufficient documentation

## 2022-06-03 DIAGNOSIS — N2581 Secondary hyperparathyroidism of renal origin: Secondary | ICD-10-CM | POA: Diagnosis not present

## 2022-06-03 DIAGNOSIS — R58 Hemorrhage, not elsewhere classified: Secondary | ICD-10-CM | POA: Diagnosis not present

## 2022-06-03 DIAGNOSIS — Z87891 Personal history of nicotine dependence: Secondary | ICD-10-CM | POA: Diagnosis not present

## 2022-06-03 DIAGNOSIS — R0789 Other chest pain: Secondary | ICD-10-CM | POA: Diagnosis not present

## 2022-06-03 DIAGNOSIS — I120 Hypertensive chronic kidney disease with stage 5 chronic kidney disease or end stage renal disease: Secondary | ICD-10-CM | POA: Diagnosis not present

## 2022-06-03 DIAGNOSIS — Z79899 Other long term (current) drug therapy: Secondary | ICD-10-CM | POA: Insufficient documentation

## 2022-06-03 DIAGNOSIS — Z91040 Latex allergy status: Secondary | ICD-10-CM | POA: Insufficient documentation

## 2022-06-03 DIAGNOSIS — R5381 Other malaise: Secondary | ICD-10-CM | POA: Diagnosis present

## 2022-06-03 DIAGNOSIS — E162 Hypoglycemia, unspecified: Secondary | ICD-10-CM

## 2022-06-03 LAB — CBC WITH DIFFERENTIAL/PLATELET
Abs Immature Granulocytes: 0.02 10*3/uL (ref 0.00–0.07)
Basophils Absolute: 0 10*3/uL (ref 0.0–0.1)
Basophils Relative: 0 %
Eosinophils Absolute: 0.1 10*3/uL (ref 0.0–0.5)
Eosinophils Relative: 3 %
HCT: 34.6 % — ABNORMAL LOW (ref 36.0–46.0)
Hemoglobin: 11.2 g/dL — ABNORMAL LOW (ref 12.0–15.0)
Immature Granulocytes: 0 %
Lymphocytes Relative: 20 %
Lymphs Abs: 0.9 10*3/uL (ref 0.7–4.0)
MCH: 31.1 pg (ref 26.0–34.0)
MCHC: 32.4 g/dL (ref 30.0–36.0)
MCV: 96.1 fL (ref 80.0–100.0)
Monocytes Absolute: 0.4 10*3/uL (ref 0.1–1.0)
Monocytes Relative: 8 %
Neutro Abs: 3.1 10*3/uL (ref 1.7–7.7)
Neutrophils Relative %: 69 %
Platelets: 136 10*3/uL — ABNORMAL LOW (ref 150–400)
RBC: 3.6 MIL/uL — ABNORMAL LOW (ref 3.87–5.11)
RDW: 13.3 % (ref 11.5–15.5)
WBC: 4.5 10*3/uL (ref 4.0–10.5)
nRBC: 0 % (ref 0.0–0.2)

## 2022-06-03 LAB — COMPREHENSIVE METABOLIC PANEL
ALT: 36 U/L (ref 0–44)
AST: 29 U/L (ref 15–41)
Albumin: 3.5 g/dL (ref 3.5–5.0)
Alkaline Phosphatase: 77 U/L (ref 38–126)
Anion gap: 12 (ref 5–15)
BUN: 27 mg/dL — ABNORMAL HIGH (ref 6–20)
CO2: 30 mmol/L (ref 22–32)
Calcium: 8.7 mg/dL — ABNORMAL LOW (ref 8.9–10.3)
Chloride: 96 mmol/L — ABNORMAL LOW (ref 98–111)
Creatinine, Ser: 4.47 mg/dL — ABNORMAL HIGH (ref 0.44–1.00)
GFR, Estimated: 13 mL/min — ABNORMAL LOW (ref >60)
Glucose, Bld: 77 mg/dL (ref 70–99)
Potassium: 3 mmol/L — ABNORMAL LOW (ref 3.5–5.1)
Sodium: 138 mmol/L (ref 135–145)
Total Bilirubin: 0.7 mg/dL (ref 0.3–1.2)
Total Protein: 7.1 g/dL (ref 6.5–8.1)

## 2022-06-03 LAB — TROPONIN I (HIGH SENSITIVITY)
Troponin I (High Sensitivity): 27 ng/L — ABNORMAL HIGH (ref <18)
Troponin I (High Sensitivity): 31 ng/L — ABNORMAL HIGH (ref <18)

## 2022-06-03 LAB — CBG MONITORING, ED
Glucose-Capillary: 54 mg/dL — ABNORMAL LOW (ref 70–99)
Glucose-Capillary: 127 mg/dL — ABNORMAL HIGH (ref 70–99)

## 2022-06-03 LAB — LIPASE, BLOOD: Lipase: 58 U/L — ABNORMAL HIGH (ref 11–51)

## 2022-06-03 MED ORDER — ONDANSETRON 4 MG PO TBDP
4.0000 mg | ORAL_TABLET | Freq: Once | ORAL | Status: AC
  Administered 2022-06-03: 4 mg via ORAL
  Filled 2022-06-03: qty 1

## 2022-06-03 MED ORDER — ACETAMINOPHEN 500 MG PO TABS
1000.0000 mg | ORAL_TABLET | Freq: Once | ORAL | Status: AC
  Administered 2022-06-03: 1000 mg via ORAL
  Filled 2022-06-03: qty 2

## 2022-06-03 NOTE — ED Triage Notes (Signed)
Pt BIB [facility] from dialysis with reports of chest pain. Pt also reports N/V and abdominal pain.

## 2022-06-03 NOTE — ED Provider Notes (Signed)
  Physical Exam  BP 136/87   Pulse 77   Temp 98.5 F (36.9 C) (Oral)   Resp 15   SpO2 100%   Physical Exam  Procedures  Procedures  ED Course / MDM    Medical Decision Making Amount and/or Complexity of Data Reviewed Labs: ordered. Radiology: ordered.  Risk OTC drugs. Prescription drug management.   31 y/o with hx of esrd on hd Felt well, but during hd felt unwell, similar to her previous low sugar, she was found to have hypoglycemia.  Eating ice cream. No iv access.   Reassessment at 7 PM: Patient is AOx3.  Family at the bedside.  Blood sugar about an hour ago was normal. She confirms that she has had similar symptoms in the past because of hypoglycemia.  She denies feeling unwell. Patient has passed oral challenge.  Stable for discharge. I requested the nurse to get a repeat CBG prior to her discharge.   Varney Biles, MD 06/03/22 1921

## 2022-06-03 NOTE — ED Notes (Signed)
12-lead delayed due to privacy concerns. IV access difficult: will wait for Phlebotomist assistance.

## 2022-06-03 NOTE — ED Provider Notes (Signed)
[facility] EMERGENCY DEPARTMENT Provider Note  CSN: [id number] Arrival date & time: 06/03/22 1054  Chief Complaint(s) Chest Pain  HPI [name] is a 31 y.o. female with PMH spina bifida, ESRD on hemodialysis Tuesday Thursday Saturday, HTN, GERD who presents to the emergency department for evaluation of chest pain, malaise and headache during dialysis today.  She states that while receiving dialysis she had an episode of chest pain with associated nausea vomiting abdominal pain to which they stopped her dialysis and sent her to the emergency department.  She states that she usually feels like this when she drops her blood sugar during dialysis.  On arrival initial blood glucose 54.    Past Medical History Past Medical History:  Diagnosis Date   Anemia associated with chronic renal failure    Blood transfusion    Caudal regression syndrome    Assoc with spina bifida.   Depression 05/14/2015   Dialysis care    ESRD (end stage renal disease) on dialysis [facility])    GERD (gastroesophageal reflux disease) 01/07/2017   HTN (hypertension) 05/02/2011   Spina bifida    UTI (lower urinary tract infection)    Patient Active Problem List   Diagnosis Date Noted   Hypoglycemia without diagnosis of diabetes mellitus 05/13/2022   Sepsis ([place]) 05/08/2020   Dialysis AV fistula malfunction ([place])    UTI (urinary tract infection) 04/29/2020   Acute UTI 05/07/2018   Tobacco use 05/07/2018   Hypokalemia 05/07/2018   GERD (gastroesophageal reflux disease) 01/07/2017   Nausea with vomiting 01/07/2017   Pyelonephritis 07/31/2015   Back pain 07/31/2015   Abdominal pain 07/31/2015   Constipation 07/31/2015   Major depressive disorder, single episode, severe without psychotic features ([place]) 05/15/2015   Spina bifida ([place]) 01/18/2012   ESRD (end stage renal disease) ([place]) 05/02/2011   Anemia 05/02/2011   HTN (hypertension) 05/02/2011   Home Medication(s) Prior to Admission medications    Medication Sig Start Date End Date Taking? Authorizing Provider  acetaminophen (TYLENOL) 500 MG tablet Take 1,000 mg by mouth every 6 (six) hours as needed for mild pain or headache.   Yes [provider]  AURYXIA 1 GM 210 MG(Fe) tablet Take 630 mg by mouth 3 (three) times daily with meals. 04/15/18  Yes [provider]  B Complex-C-Folic Acid (DIALYVITE TABLET) TABS Take 1 tablet by mouth daily.   Yes [provider]  diphenhydrAMINE (BENADRYL) 25 mg capsule Take 25 mg by mouth daily as needed for allergies.   Yes [provider]  Doxercalciferol (HECTOROL IV) Given at dialysis 01/07/22 01/06/23 Yes [provider]  Epoetin Alfa (EPOGEN IJ) Inject as directed See admin instructions. Every Tuesday, Thursday, Saturday   Yes [provider]  hydrALAZINE (APRESOLINE) 25 MG tablet Take 25 mg by mouth 3 (three) times daily. 04/28/20  Yes [provider]  Methoxy PEG-Epoetin Beta (MIRCERA IJ) Mircera 04/17/22 04/16/23 Yes [provider]  metoprolol succinate (TOPROL-XL) 100 MG 24 hr tablet Take 100 mg by mouth daily. 12/29/19  Yes [provider]  omeprazole (PRILOSEC) 40 MG capsule Take 40 mg by mouth every morning. 01/08/22  Yes [provider]  ondansetron (ZOFRAN-ODT) 4 MG disintegrating tablet Take 1 tablet (4 mg total) by mouth every 8 (eight) hours as needed for nausea or vomiting. 04/28/22  Yes Elgie Congo, MD  sodium zirconium cyclosilicate (LOKELMA) 5 g packet Take 10 g by mouth every Monday, Wednesday, and Friday. 07/20/21  Yes [provider]  ondansetron (  ZOFRAN) 4 MG tablet Take 1 tablet (4 mg total) by mouth every 6 (six) hours as needed for nausea or vomiting. Patient not taking: Reported on 06/03/2022 04/04/21   Marcello Fennel, PA-C                                                                                                                                    Past Surgical History Past  Surgical History:  Procedure Laterality Date   A/V FISTULAGRAM Left 10/14/2021   Procedure: A/V Fistulagram;  Surgeon: [name], MD;  Location: [facility];  Service: Cardiovascular;  Laterality: Left;   AV FISTULA PLACEMENT     left arm   INSERTION OF DIALYSIS CATHETER N/A 05/08/2020   Procedure: ATTEMPTED, UNSUCCESSFUL INSERTION OF DIALYSIS CATHETER TUNNELED RIGHT INTERNAL JUGULAR;  Surgeon: Virl Cagey, MD;  Location: [facility];  Service: General;  Laterality: N/A;   IR FLUORO GUIDE CV LINE RIGHT  05/09/2020   IR US GUIDE VASC ACCESS RIGHT  05/09/2020   Family History Family History  Problem Relation Age of Onset   Kidney cancer Other    Hypertension Maternal Grandmother    Arthritis Maternal Grandmother    Breast cancer Maternal Aunt    Colon cancer Neg Hx     Social History Social History   Tobacco Use   Smoking status: Former    Packs/day: 0.10    Types: Cigarettes   Smokeless tobacco: Never   Tobacco comments:    2 cigs a day  Substance Use Topics   Alcohol use: No   Drug use: Yes    Types: Marijuana   Allergies Ciprofloxacin; Other; Peanut-containing drug products; Aleve [naproxen sodium]; Ceftriaxone; Coconut (cocos nucifera); Influenza vaccines; Tetanus toxoid, adsorbed; Tetanus toxoids; and Latex  Review of Systems Review of Systems  Cardiovascular:  Positive for chest pain.  Gastrointestinal:  Positive for abdominal pain, nausea and vomiting.    Physical Exam Vital Signs  I have reviewed the triage vital signs BP 136/87   Pulse 77   Temp 98.5 F (36.9 C) (Oral)   Resp 15   SpO2 100%   Physical Exam Vitals and nursing note reviewed.  Constitutional:      General: She is not in acute distress.    Appearance: She is well-developed.  HENT:     Head: Normocephalic and atraumatic.  Eyes:     Conjunctiva/sclera: Conjunctivae normal.  Cardiovascular:     Rate and Rhythm: Normal rate and regular rhythm.     Heart sounds:  No murmur heard. Pulmonary:     Effort: Pulmonary effort is normal. No respiratory distress.     Breath sounds: Normal breath sounds.  Abdominal:     Palpations: Abdomen is soft.     Tenderness: There is no abdominal tenderness.  Musculoskeletal:        General: No swelling.     Cervical back: Neck supple.  Skin:    General: Skin is warm and dry.     Capillary Refill: Capillary refill takes less than 2 seconds.  Neurological:     Mental Status: She is alert.  Psychiatric:        Mood and Affect: Mood normal.     ED Results and Treatments Labs (all labs ordered are listed, but only abnormal results are displayed) Labs Reviewed  COMPREHENSIVE METABOLIC PANEL - Abnormal; Notable for the following components:      Result Value   Potassium 3.0 (*)    Chloride 96 (*)    BUN 27 (*)    Creatinine, Ser 4.47 (*)    Calcium 8.7 (*)    GFR, Estimated 13 (*)    All other components within normal limits  CBC WITH DIFFERENTIAL/PLATELET - Abnormal; Notable for the following components:   RBC 3.60 (*)    Hemoglobin 11.2 (*)    HCT 34.6 (*)    Platelets 136 (*)    All other components within normal limits  LIPASE, BLOOD - Abnormal; Notable for the following components:   Lipase 58 (*)    All other components within normal limits  CBG MONITORING, ED - Abnormal; Notable for the following components:   Glucose-Capillary 54 (*)    All other components within normal limits  TROPONIN I (HIGH SENSITIVITY) - Abnormal; Notable for the following components:   Troponin I (High Sensitivity) 27 (*)    All other components within normal limits  TROPONIN I (HIGH SENSITIVITY)                                                                                                                          Radiology DG Chest 2 View  Result Date: 06/03/2022 CLINICAL DATA:  Chest pain EXAM: CHEST - 2 VIEW COMPARISON:  09/01/2021 FINDINGS: Study is AP lordotic in positioning which accentuates heart size. No  confluent airspace opacities, effusions or edema. No acute bony abnormality. IMPRESSION: No active cardiopulmonary disease. Electronically Signed   By: Rolm Baptise M.D.   On: 06/03/2022 12:03    Pertinent labs & imaging results that were available during my care of the patient were reviewed by me and considered in my medical decision making (see MDM for details).  Medications Ordered in ED Medications  ondansetron (ZOFRAN-ODT) disintegrating tablet 4 mg (4 mg Oral Given 06/03/22 1208)  acetaminophen (TYLENOL) tablet 1,000 mg (1,000 mg Oral Given 06/03/22 1319)  Procedures .Critical Care  Performed by: Teressa Lower, MD Authorized by: Teressa Lower, MD   Critical care provider statement:    Critical care time (minutes):  30   Critical care was necessary to treat or prevent imminent or life-threatening deterioration of the following conditions:  Endocrine crisis   Critical care was time spent personally by me on the following activities:  Development of treatment plan with patient or surrogate, discussions with consultants, evaluation of patient's response to treatment, examination of patient, ordering and review of laboratory studies, ordering and review of radiographic studies, ordering and performing treatments and interventions, pulse oximetry, re-evaluation of patient's condition and review of old charts   (including critical care time)  Medical Decision Making / ED Course   This patient presents to the ED for concern of chest pain, abdominal pain, nausea, vomiting, this involves an extensive number of treatment options, and is a complaint that carries with it a high risk of complications and morbidity.  The differential diagnosis includes hypoglycemia, ACS, electrolyte abnormality, pneumonia  MDM: Patient seen in the emergency department for evaluation  of multiple complaints as described above.  Physical exam unchanged from patient's baseline and patient overall well-appearing on arrival.  After initial blood glucose of 54 she was given multiple sugar containing foods and drinks and follow-up laboratory evaluation with a lipase of 58, potassium 3.0, BUN 27, creatinine 4.47, hemoglobin 11.2, initial high-sensitivity troponin 27.  Patient given nausea medication and Tylenol for headache that developed here in the emergency department.  Chest x-ray unremarkable.  ECG nonischemic.  At time of signout, patient pending delta troponin.  Anticipate discharge home if normal.  Suspect patient had an episode of hypoglycemia during dialysis prompting her constellation of symptoms.   Additional history obtained:  -External records from outside source obtained and reviewed including: Chart review including previous notes, labs, imaging, consultation notes   Lab Tests: -I ordered, reviewed, and interpreted labs.   The pertinent results include:   Labs Reviewed  COMPREHENSIVE METABOLIC PANEL - Abnormal; Notable for the following components:      Result Value   Potassium 3.0 (*)    Chloride 96 (*)    BUN 27 (*)    Creatinine, Ser 4.47 (*)    Calcium 8.7 (*)    GFR, Estimated 13 (*)    All other components within normal limits  CBC WITH DIFFERENTIAL/PLATELET - Abnormal; Notable for the following components:   RBC 3.60 (*)    Hemoglobin 11.2 (*)    HCT 34.6 (*)    Platelets 136 (*)    All other components within normal limits  LIPASE, BLOOD - Abnormal; Notable for the following components:   Lipase 58 (*)    All other components within normal limits  CBG MONITORING, ED - Abnormal; Notable for the following components:   Glucose-Capillary 54 (*)    All other components within normal limits  TROPONIN I (HIGH SENSITIVITY) - Abnormal; Notable for the following components:   Troponin I (High Sensitivity) 27 (*)    All other components within normal  limits  TROPONIN I (HIGH SENSITIVITY)      EKG   EKG Interpretation  Date/Time:  Tuesday June 03 2022 12:28:23 EST Ventricular Rate:  78 PR Interval:  132 QRS Duration: 82 QT Interval:  425 QTC Calculation: 485 R Axis:   120 Text Interpretation: Sinus rhythm Left ventricular hypertrophy Confirmed by [place] ([id number]) on 06/03/2022 9:13:40 PM         Imaging Studies  ordered: I ordered imaging studies including chest x-ray I independently visualized and interpreted imaging. I agree with the radiologist interpretation   Medicines ordered and prescription drug management: Meds ordered this encounter  Medications   ondansetron (ZOFRAN-ODT) disintegrating tablet 4 mg   acetaminophen (TYLENOL) tablet 1,000 mg    -I have reviewed the patients home medicines and have made adjustments as needed  Critical interventions Oral glucose supplementation   Cardiac Monitoring: The patient was maintained on a cardiac monitor.  I personally viewed and interpreted the cardiac monitored which showed an underlying rhythm of: NSR  Social Determinants of Health:  Factors impacting patients care include: none   Reevaluation: After the interventions noted above, I reevaluated the patient and found that they have :improved  Co morbidities that complicate the patient evaluation  Past Medical History:  Diagnosis Date   Anemia associated with chronic renal failure    Blood transfusion    Caudal regression syndrome    Assoc with spina bifida.   Depression 05/14/2015   Dialysis care    ESRD (end stage renal disease) on dialysis [facility])    GERD (gastroesophageal reflux disease) 01/07/2017   HTN (hypertension) 05/02/2011   Spina bifida    UTI (lower urinary tract infection)       Dispostion: I considered admission for this patient, and disposition pending delta troponin.  Anticipate discharge if normal and patient symptoms improved.  Please see provider signout for continuation of  work-up.     Final Clinical Impression(s) / ED Diagnoses Final diagnoses:  None     '@PCDICTATION'$ @    Teressa Lower, MD 06/03/22 2114

## 2022-06-05 DIAGNOSIS — N2581 Secondary hyperparathyroidism of renal origin: Secondary | ICD-10-CM | POA: Diagnosis not present

## 2022-06-05 DIAGNOSIS — N186 End stage renal disease: Secondary | ICD-10-CM | POA: Diagnosis not present

## 2022-06-05 DIAGNOSIS — Z992 Dependence on renal dialysis: Secondary | ICD-10-CM | POA: Diagnosis not present

## 2022-06-07 DIAGNOSIS — N2581 Secondary hyperparathyroidism of renal origin: Secondary | ICD-10-CM | POA: Diagnosis not present

## 2022-06-07 DIAGNOSIS — N186 End stage renal disease: Secondary | ICD-10-CM | POA: Diagnosis not present

## 2022-06-07 DIAGNOSIS — Z992 Dependence on renal dialysis: Secondary | ICD-10-CM | POA: Diagnosis not present

## 2022-06-10 DIAGNOSIS — N2581 Secondary hyperparathyroidism of renal origin: Secondary | ICD-10-CM | POA: Diagnosis not present

## 2022-06-10 DIAGNOSIS — N186 End stage renal disease: Secondary | ICD-10-CM | POA: Diagnosis not present

## 2022-06-10 DIAGNOSIS — Z992 Dependence on renal dialysis: Secondary | ICD-10-CM | POA: Diagnosis not present

## 2022-06-12 ENCOUNTER — Telehealth: Payer: Self-pay | Admitting: Family Medicine

## 2022-06-12 DIAGNOSIS — N2581 Secondary hyperparathyroidism of renal origin: Secondary | ICD-10-CM | POA: Diagnosis not present

## 2022-06-12 DIAGNOSIS — N186 End stage renal disease: Secondary | ICD-10-CM | POA: Diagnosis not present

## 2022-06-12 DIAGNOSIS — E039 Hypothyroidism, unspecified: Secondary | ICD-10-CM | POA: Diagnosis not present

## 2022-06-12 DIAGNOSIS — Z992 Dependence on renal dialysis: Secondary | ICD-10-CM | POA: Diagnosis not present

## 2022-06-12 NOTE — Telephone Encounter (Signed)
Call pt's care taker, Meda Coffee, to call [facility] to find out what kind of test they for pt to have done and have form fax to [place] from [place]. Pt's care take, Meda Coffee, voiced understanding

## 2022-06-12 NOTE — Telephone Encounter (Signed)
Received call from patient's significant other Meda Coffee to follow up on patient's request for a power wheelchair; stated patient was referred to [facility] [phone]) for the power wheelchair by [facility]. They need a referral and certain tests to approve the request.  Meda Coffee requesting call back to advise about whether or not the patient needs another appointment.  Please advise at [phone] or [phone]

## 2022-06-13 ENCOUNTER — Encounter (HOSPITAL_BASED_OUTPATIENT_CLINIC_OR_DEPARTMENT_OTHER): Payer: Self-pay | Admitting: Emergency Medicine

## 2022-06-13 ENCOUNTER — Other Ambulatory Visit: Payer: Self-pay

## 2022-06-13 ENCOUNTER — Emergency Department (HOSPITAL_BASED_OUTPATIENT_CLINIC_OR_DEPARTMENT_OTHER)
Admission: EM | Admit: 2022-06-13 | Discharge: 2022-06-13 | Disposition: A | Discharge status: Home / Self Care | Payer: Medicare Other | Attending: Emergency Medicine | Admitting: Emergency Medicine

## 2022-06-13 DIAGNOSIS — Z992 Dependence on renal dialysis: Secondary | ICD-10-CM | POA: Insufficient documentation

## 2022-06-13 DIAGNOSIS — R110 Nausea: Secondary | ICD-10-CM | POA: Insufficient documentation

## 2022-06-13 DIAGNOSIS — D649 Anemia, unspecified: Secondary | ICD-10-CM | POA: Diagnosis not present

## 2022-06-13 DIAGNOSIS — R1013 Epigastric pain: Secondary | ICD-10-CM | POA: Insufficient documentation

## 2022-06-13 DIAGNOSIS — N186 End stage renal disease: Secondary | ICD-10-CM | POA: Insufficient documentation

## 2022-06-13 DIAGNOSIS — R42 Dizziness and giddiness: Secondary | ICD-10-CM | POA: Insufficient documentation

## 2022-06-13 DIAGNOSIS — R112 Nausea with vomiting, unspecified: Secondary | ICD-10-CM | POA: Diagnosis not present

## 2022-06-13 DIAGNOSIS — R9431 Abnormal electrocardiogram [ECG] [EKG]: Secondary | ICD-10-CM | POA: Diagnosis not present

## 2022-06-13 DIAGNOSIS — I120 Hypertensive chronic kidney disease with stage 5 chronic kidney disease or end stage renal disease: Secondary | ICD-10-CM | POA: Insufficient documentation

## 2022-06-13 LAB — CBC WITH DIFFERENTIAL/PLATELET
Abs Immature Granulocytes: 0.02 10*3/uL (ref 0.00–0.07)
Basophils Absolute: 0 10*3/uL (ref 0.0–0.1)
Basophils Relative: 0 %
Eosinophils Absolute: 0.3 10*3/uL (ref 0.0–0.5)
Eosinophils Relative: 4 %
HCT: 37.6 % (ref 36.0–46.0)
Hemoglobin: 11.8 g/dL — ABNORMAL LOW (ref 12.0–15.0)
Immature Granulocytes: 0 %
Lymphocytes Relative: 24 %
Lymphs Abs: 1.5 10*3/uL (ref 0.7–4.0)
MCH: 30.9 pg (ref 26.0–34.0)
MCHC: 31.4 g/dL (ref 30.0–36.0)
MCV: 98.4 fL (ref 80.0–100.0)
Monocytes Absolute: 0.5 10*3/uL (ref 0.1–1.0)
Monocytes Relative: 8 %
Neutro Abs: 3.9 10*3/uL (ref 1.7–7.7)
Neutrophils Relative %: 64 %
Platelets: 148 10*3/uL — ABNORMAL LOW (ref 150–400)
RBC: 3.82 MIL/uL — ABNORMAL LOW (ref 3.87–5.11)
RDW: 14 % (ref 11.5–15.5)
WBC: 6.2 10*3/uL (ref 4.0–10.5)
nRBC: 0 % (ref 0.0–0.2)

## 2022-06-13 LAB — COMPREHENSIVE METABOLIC PANEL
ALT: 50 U/L — ABNORMAL HIGH (ref 0–44)
AST: 49 U/L — ABNORMAL HIGH (ref 15–41)
Albumin: 3.7 g/dL (ref 3.5–5.0)
Alkaline Phosphatase: 88 U/L (ref 38–126)
Anion gap: 13 (ref 5–15)
BUN: 45 mg/dL — ABNORMAL HIGH (ref 6–20)
CO2: 30 mmol/L (ref 22–32)
Calcium: 9.5 mg/dL (ref 8.9–10.3)
Chloride: 97 mmol/L — ABNORMAL LOW (ref 98–111)
Creatinine, Ser: 6.59 mg/dL — ABNORMAL HIGH (ref 0.44–1.00)
GFR, Estimated: 8 mL/min — ABNORMAL LOW (ref >60)
Glucose, Bld: 86 mg/dL (ref 70–99)
Potassium: 4.2 mmol/L (ref 3.5–5.1)
Sodium: 140 mmol/L (ref 135–145)
Total Bilirubin: 0.7 mg/dL (ref 0.3–1.2)
Total Protein: 7.6 g/dL (ref 6.5–8.1)

## 2022-06-13 LAB — LIPASE, BLOOD: Lipase: 65 U/L — ABNORMAL HIGH (ref 11–51)

## 2022-06-13 LAB — HCG, SERUM, QUALITATIVE: Preg, Serum: NEGATIVE

## 2022-06-13 MED ORDER — ONDANSETRON 4 MG PO TBDP
4.0000 mg | ORAL_TABLET | Freq: Once | ORAL | Status: AC
  Administered 2022-06-13: 4 mg via ORAL
  Filled 2022-06-13: qty 1

## 2022-06-13 MED ORDER — ONDANSETRON 4 MG PO TBDP: 4.0000 mg | ORAL_TABLET | Freq: Three times a day (TID) | ORAL | 0 refills | Status: DC | PRN

## 2022-06-13 NOTE — ED Notes (Signed)
Pt on monitor 

## 2022-06-13 NOTE — Discharge Instructions (Signed)

## 2022-06-13 NOTE — ED Provider Notes (Signed)
Emergency Department Provider Note   I have reviewed the triage vital signs and the nursing notes.   HISTORY  Chief Complaint Abdominal Pain   HPI [name] is a 31 y.o. female with PMH of ESRD, GERD, and spina bifida presents to the emergency department with lightheadedness and nausea.  She presents here with her boyfriend who describes this as an ongoing issue for months to years.  Patient says that tonight she felt lightheaded while lying in bed which is unusual.  She typically feels lightheaded with vomiting.  She has been vomiting today as well.  She is having some epigastric abdominal pain which is burning in quality.  No lower abdomen or back pain.  She rarely makes urine. No fever/chills.    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   Depression 05/14/2015   Dialysis care    ESRD (end stage renal disease) on dialysis [facility])    GERD (gastroesophageal reflux disease) 01/07/2017   HTN (hypertension) 05/02/2011   Spina bifida    UTI (lower urinary tract infection)     Review of Systems  Constitutional: No fever/chills. Positive lightheadedness.  Eyes: No visual changes. ENT: No sore throat. Cardiovascular: Denies chest pain. Respiratory: Denies shortness of breath. Gastrointestinal: Positive epigastric abdominal pain. Positive nausea and vomiting.  No diarrhea.  No constipation. Musculoskeletal: Negative for back pain. Skin: Negative for rash. Neurological: Negative for headaches, focal weakness or numbness.   ____________________________________________   PHYSICAL EXAM:  VITAL SIGNS: ED Triage Vitals  Enc Vitals Group     BP 06/13/22 0448 (!) 143/95     Pulse Rate 06/13/22 0448 81     Resp 06/13/22 0448 18     Temp 06/13/22 0448 98.8 F (37.1 C)     Temp Source 06/13/22 0448 Oral     SpO2 06/13/22 0448 94 %     Weight  06/13/22 0449 64 lb (29 kg)   Constitutional: Alert and oriented. Well appearing and in no acute distress. Eyes: Conjunctivae are normal.  Head: Atraumatic. Nose: No congestion/rhinnorhea. Mouth/Throat: Mucous membranes are moist.  Neck: No stridor.   Cardiovascular: Normal rate, regular rhythm. Good peripheral circulation. Grossly normal heart sounds.   Respiratory: Normal respiratory effort.  No retractions. Lungs CTAB. Gastrointestinal: Soft with mild epigastric tenderness. No rebound or guarding. No lower abdominal tenderness. No distention.  Neurologic:  Normal speech and language. Skin:  Skin is warm, dry and intact. No rash noted.   ____________________________________________   LABS (all labs ordered are listed, but only abnormal results are displayed)  Labs Reviewed  COMPREHENSIVE METABOLIC PANEL - Abnormal; Notable for the following components:      Result Value   Chloride 97 (*)    BUN 45 (*)    Creatinine, Ser 6.59 (*)    AST 49 (*)    ALT 50 (*)    GFR, Estimated 8 (*)    All other components within normal limits  LIPASE, BLOOD - Abnormal; Notable for the following components:   Lipase 65 (*)    All other components within normal limits  CBC WITH DIFFERENTIAL/PLATELET - Abnormal; Notable for the following components:   RBC 3.82 (*)    Hemoglobin 11.8 (*)    Platelets 148 (*)    All other components within normal limits  HCG, SERUM, QUALITATIVE   ____________________________________________  EKG  EKG Interpretation  Date/Time:  Friday June 13 2022 05:07:40 EST Ventricular Rate:  85 PR Interval:  125 QRS Duration: 82 QT Interval:  402 QTC Calculation: 478 R Axis:   83 Text Interpretation: Sinus rhythm Probable left ventricular hypertrophy Borderline prolonged QT interval Confirmed by Nanda Quinton [phone]) on 06/13/2022 5:11:26 AM        ____________________________________________   PROCEDURES  Procedure(s) performed:    Procedures  None  ____________________________________________   INITIAL IMPRESSION / ASSESSMENT AND PLAN / ED COURSE  Pertinent labs & imaging results that were available during my care of the patient were reviewed by me and considered in my medical decision making (see chart for details).   This patient is Presenting for Evaluation of abdominal pain, which does require a range of treatment options, and is a complaint that involves a high risk of morbidity and mortality.  The Differential Diagnoses includes but is not exclusive to acute cholecystitis, intrathoracic causes for epigastric abdominal pain, gastritis, duodenitis, pancreatitis, small bowel or large bowel obstruction, abdominal aortic aneurysm, hernia, gastritis, etc.   Critical Interventions-    Medications  ondansetron (ZOFRAN-ODT) disintegrating tablet 4 mg (4 mg Oral Given 06/13/22 0515)    Reassessment after intervention: Symptoms improved.    I did obtain Additional Historical Information from boyfriend at bedside.  I decided to review pertinent External Data, and in summary patient with prior ED visits for similar, including in the last 10 days.   Clinical Laboratory Tests Ordered, included no leukocytosis.  Mild anemia.  Pregnancy negative.  No severe electrolyte disturbance.  LFTs very mildly elevated similar to prior values.  Radiologic Tests: Considered abdominal imaging but mild tenderness on exam and symptoms similar to prior ED evaluations with multiple CT scans this year with largely chronic findings.   Cardiac Monitor Tracing which shows NSR.   Social Determinants of Health Risk patient is not an active smoker.   Medical Decision Making: Summary:  Patient presents emergency department with epigastric pain and vomiting along with lightheadedness.  Arrives with mild hypertension.  No tachycardia or fever.  Overall appears well.  She is not altered.  Multiple presentations to the ED for similar in the  past.  Plan to defer abdominal imaging for now but will obtain screening blood work.  She has been compliant with dialysis.   Reevaluation with update and discussion with patient.  She is resting comfortably after Zofran feeling much better.  Plan to defer abdominal imaging for now.  Plan to send a refill of Zofran to the pharmacy of her choice and advise close PCP follow-up.  Considered admission but symptoms are well controlled with reassuring labs.  Stable for discharge.  Disposition: discharge  ____________________________________________  FINAL CLINICAL IMPRESSION(S) / ED DIAGNOSES  Final diagnoses:  Epigastric pain  Nausea     NEW OUTPATIENT MEDICATIONS STARTED DURING THIS VISIT:  New Prescriptions   ONDANSETRON (ZOFRAN-ODT) 4 MG DISINTEGRATING TABLET    Take 1 tablet (4 mg total) by mouth every 8 (eight) hours as needed.    Note:  This document was prepared using Dragon voice recognition software and may include unintentional dictation errors.  Nanda Quinton, MD, [facility] Emergency Medicine    Dhruvan Gullion, [facility], MD 06/13/22 [phone]

## 2022-06-13 NOTE — ED Triage Notes (Signed)
Pt brought in by her boyfriend  Pt is c/o dizziness  States her blood pressure has been dropping and her blood sugar has been dropping lately  Pt is c/o abd pain and states she is having nausea  Pt is a dialysis pt and had her last on Thursday

## 2022-06-14 DIAGNOSIS — N186 End stage renal disease: Secondary | ICD-10-CM | POA: Diagnosis not present

## 2022-06-14 DIAGNOSIS — N2581 Secondary hyperparathyroidism of renal origin: Secondary | ICD-10-CM | POA: Diagnosis not present

## 2022-06-14 DIAGNOSIS — Z992 Dependence on renal dialysis: Secondary | ICD-10-CM | POA: Diagnosis not present

## 2022-06-16 DIAGNOSIS — Z992 Dependence on renal dialysis: Secondary | ICD-10-CM | POA: Diagnosis not present

## 2022-06-16 DIAGNOSIS — N186 End stage renal disease: Secondary | ICD-10-CM | POA: Diagnosis not present

## 2022-06-16 DIAGNOSIS — N2581 Secondary hyperparathyroidism of renal origin: Secondary | ICD-10-CM | POA: Diagnosis not present

## 2022-06-17 ENCOUNTER — Other Ambulatory Visit: Payer: Self-pay | Admitting: Family Medicine

## 2022-06-17 DIAGNOSIS — Q057 Lumbar spina bifida without hydrocephalus: Secondary | ICD-10-CM

## 2022-06-18 DIAGNOSIS — Z992 Dependence on renal dialysis: Secondary | ICD-10-CM | POA: Diagnosis not present

## 2022-06-18 DIAGNOSIS — N2581 Secondary hyperparathyroidism of renal origin: Secondary | ICD-10-CM | POA: Diagnosis not present

## 2022-06-18 DIAGNOSIS — N186 End stage renal disease: Secondary | ICD-10-CM | POA: Diagnosis not present

## 2022-06-21 DIAGNOSIS — Z992 Dependence on renal dialysis: Secondary | ICD-10-CM | POA: Diagnosis not present

## 2022-06-21 DIAGNOSIS — N2581 Secondary hyperparathyroidism of renal origin: Secondary | ICD-10-CM | POA: Diagnosis not present

## 2022-06-21 DIAGNOSIS — N186 End stage renal disease: Secondary | ICD-10-CM | POA: Diagnosis not present

## 2022-06-24 DIAGNOSIS — N2581 Secondary hyperparathyroidism of renal origin: Secondary | ICD-10-CM | POA: Diagnosis not present

## 2022-06-24 DIAGNOSIS — N186 End stage renal disease: Secondary | ICD-10-CM | POA: Diagnosis not present

## 2022-06-24 DIAGNOSIS — Z992 Dependence on renal dialysis: Secondary | ICD-10-CM | POA: Diagnosis not present

## 2022-06-25 ENCOUNTER — Ambulatory Visit: Payer: Medicare Other

## 2022-06-25 ENCOUNTER — Telehealth: Payer: Self-pay | Admitting: Family Medicine

## 2022-06-25 ENCOUNTER — Ambulatory Visit
Admission: EM | Admit: 2022-06-25 | Discharge: 2022-06-25 | Disposition: A | Discharge status: Home / Self Care | Payer: Medicare Other | Attending: Urgent Care | Admitting: Urgent Care

## 2022-06-25 DIAGNOSIS — N186 End stage renal disease: Secondary | ICD-10-CM

## 2022-06-25 DIAGNOSIS — Z992 Dependence on renal dialysis: Secondary | ICD-10-CM | POA: Diagnosis not present

## 2022-06-25 DIAGNOSIS — H6993 Unspecified Eustachian tube disorder, bilateral: Secondary | ICD-10-CM | POA: Diagnosis not present

## 2022-06-25 MED ORDER — PREDNISONE 10 MG PO TABS: 30.0000 mg | ORAL_TABLET | Freq: Every day | ORAL | 0 refills | Status: DC

## 2022-06-25 MED ORDER — FLUTICASONE PROPIONATE 50 MCG/ACT NA SUSP: 2.0000 | Freq: Every day | NASAL | 12 refills | Status: DC

## 2022-06-25 NOTE — Patient Outreach (Signed)
  Care Coordination   Follow Up Visit Note   06/25/2022 Name: [name] MRN: [id number] DOB: 1990-09-03  [name] is a 31 y.o. year old female who sees Rubie Maid, FNP for primary care. I spoke with  [name] by phone today.  What matters to the patients health and wellness today?  I need a prescription for a physical therapy seating evaluation    Goals Addressed             This Visit's Progress    COMPLETED: Care Coordination Activities       Care Coordination Interventions: Determined the patient has been in contact with [facility] regarding the need for a power wheel chair Discussed a written prescription needs to be faxed to [facility] at [phone] to include patients name, DOB, Diagnosis with diagnosis codes, and request for "physical therapy seating evaluation" Reviewed once this prescription is received the patient will complete an evaluation at [facility] in order to move forward with obtaining a power chair Performed chart review to note a referral was placed on 11/21 to a different company for an evaluation for a power chair - patient prefers to proceed with [facility] would message the patients primary care provider advising of above request Collaboration with Barb Merino RN Care Manager advising of interventions and plan; RN Care Manager to follow up with the patient on 12/4         SDOH assessments and interventions completed:  No     Care Coordination Interventions:  Yes, provided   Follow up plan: Follow up call scheduled for 12/4 with RN Care Manager    Encounter Outcome:  Pt. Visit Completed   Daneen Schick, Arita Miss, CDP Social Worker, Certified Dementia Practitioner [facility]  Care Coordination [phone]

## 2022-06-25 NOTE — Patient Instructions (Signed)
Visit Information  Thank you for taking time to visit with me today. Please don't hesitate to contact me if I can be of assistance to you.   Following are the goals we discussed today:   Goals Addressed             This Visit's Progress    COMPLETED: Care Coordination Activities       Care Coordination Interventions: Determined the patient has been in contact with [place] regarding the need for a power wheel chair Discussed a written prescription needs to be faxed to [facility] at [phone] to include patients name, DOB, Diagnosis with diagnosis codes, and request for "physical therapy seating evaluation" Reviewed once this prescription is received the patient will complete an evaluation at [facility] in order to move forward with obtaining a power chair Performed chart review to note a referral was placed on 11/21 to a different company for an evaluation for a power chair - patient prefers to proceed with [place] would message the patients primary care provider advising of above request Collaboration with Barb Merino RN Care Manager advising of interventions and plan; RN Care Manager to follow up with the patient on 12/4         Your next appointment is by telephone on 12/4 with [place]  Please call the care guide team at [phone] if you need to cancel or reschedule your appointment.   If you are experiencing a Mental Health or [place] or need someone to talk to, please call 1-800-273-TALK (toll free, 24 hour hotline)  Patient verbalizes understanding of instructions and care plan provided today and agrees to view in [place]. Active MyChart status and patient understanding of how to access instructions and care plan via MyChart confirmed with patient.     Telephone follow up appointment with care management team member scheduled for:12/4   Daneen Schick, [place], CDP Social Worker, Certified  Dementia Practitioner [place] [facility]  Care Coordination [phone]

## 2022-06-25 NOTE — Telephone Encounter (Signed)
Rx faxed to [facility] at [place], IC10 code reques for physical seating evaluation at 06/25/22 4pm

## 2022-06-25 NOTE — Telephone Encounter (Signed)
Received voicemail message from [place] with [facility]; she returned the call you made to them to follow up on the status of the patient's referral.  Please advise at [phone], ext [phone].

## 2022-06-25 NOTE — ED Provider Notes (Signed)
[facility] - URGENT CARE CENTER  Note:  This document was prepared using Systems analyst and may include unintentional dictation errors.  MRN: [id number] DOB: [birth year]  Subjective:   [name] is a 31 y.o. female presenting for 3 day history of acute onset bilateral ear pain, No fever, ear drainage, tinnitus, dizziness. Patient is ESRD status, has dialysis 3 times weekly.   No current facility-administered medications for this encounter.  Current Outpatient Medications:    acetaminophen (TYLENOL) 500 MG tablet, Take 1,000 mg by mouth every 6 (six) hours as needed for mild pain or headache., Disp: , Rfl:    AURYXIA 1 GM 210 MG(Fe) tablet, Take 630 mg by mouth 3 (three) times daily with meals., Disp: , Rfl: 3   B Complex-C-Folic Acid (DIALYVITE TABLET) TABS, Take 1 tablet by mouth daily., Disp: , Rfl:    diphenhydrAMINE (BENADRYL) 25 mg capsule, Take 25 mg by mouth daily as needed for allergies., Disp: , Rfl:    Doxercalciferol (HECTOROL IV), Given at dialysis, Disp: , Rfl:    Epoetin Alfa (EPOGEN IJ), Inject as directed See admin instructions. Every Tuesday, Thursday, Saturday, Disp: , Rfl:    hydrALAZINE (APRESOLINE) 25 MG tablet, Take 25 mg by mouth 3 (three) times daily., Disp: , Rfl:    Methoxy PEG-Epoetin Beta (MIRCERA IJ), Mircera, Disp: , Rfl:    metoprolol succinate (TOPROL-XL) 100 MG 24 hr tablet, Take 100 mg by mouth daily., Disp: , Rfl:    omeprazole (PRILOSEC) 40 MG capsule, Take 40 mg by mouth every morning., Disp: , Rfl:    ondansetron (ZOFRAN) 4 MG tablet, Take 1 tablet (4 mg total) by mouth every 6 (six) hours as needed for nausea or vomiting. (Patient not taking: Reported on 06/03/2022), Disp: 12 tablet, Rfl: 0   ondansetron (ZOFRAN-ODT) 4 MG disintegrating tablet, Take 1 tablet (4 mg total) by mouth every 8 (eight) hours as needed., Disp: 20 tablet, Rfl: 0   sodium zirconium cyclosilicate (LOKELMA) 5 g packet, Take 10 g by mouth every Monday,  Wednesday, and Friday., Disp: , Rfl:    Allergies  Allergen Reactions   Ciprofloxacin Shortness Of Breath, Nausea And Vomiting and Other (See Comments)    HIGH FEVER and oral blisters    Other Anaphylaxis    Revaclear dialzer   Peanut-Containing Drug Products Anaphylaxis   Aleve [Naproxen Sodium] Other (See Comments)    G.I.Bleed   Ceftriaxone Other (See Comments)    Blisters in mouth    Coconut (Cocos Nucifera) Hives   Influenza Vaccines Nausea And Vomiting and Other (See Comments)    High fever   Tetanus Toxoid, Adsorbed Nausea And Vomiting and Other (See Comments)    HIGH FEVER, also   Tetanus Toxoids Nausea And Vomiting and Other (See Comments)    HIGH FEVER   Latex Itching and Rash    Past Medical History:  Diagnosis Date   Anemia associated with chronic renal failure    Blood transfusion    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   Depression 05/14/2015   Dialysis care    ESRD (end stage renal disease) on dialysis [facility])    GERD (gastroesophageal reflux disease) 01/07/2017   HTN (hypertension) 05/02/2011   Spina bifida    UTI (lower urinary tract infection)      Past Surgical History:  Procedure Laterality Date   A/V FISTULAGRAM Left 10/14/2021   Procedure: A/V Fistulagram;  Surgeon: [name], MD;  Location: [facility]  INVASIVE CV LAB;  Service: Cardiovascular;  Laterality: Left;   AV FISTULA PLACEMENT     left arm   INSERTION OF DIALYSIS CATHETER N/A 05/08/2020   Procedure: ATTEMPTED, UNSUCCESSFUL INSERTION OF DIALYSIS CATHETER TUNNELED RIGHT INTERNAL JUGULAR;  Surgeon: Virl Cagey, MD;  Location: [facility];  Service: General;  Laterality: N/A;   IR FLUORO GUIDE CV LINE RIGHT  05/09/2020   IR US GUIDE VASC ACCESS RIGHT  05/09/2020    Family History  Problem Relation Age of Onset   Kidney cancer Other    Hypertension Maternal Grandmother    Arthritis Maternal Grandmother    Breast cancer Maternal Aunt    Colon cancer Neg Hx      Social History   Tobacco Use   Smoking status: Former    Packs/day: 0.10    Types: Cigarettes   Smokeless tobacco: Never   Tobacco comments:    2 cigs a day  Substance Use Topics   Alcohol use: No   Drug use: Yes    Types: Marijuana    ROS   Objective:   Vitals: BP (!) 165/115 (BP Location: Left Arm)   Pulse 93   Temp 98.6 F (37 C) (Oral)   Resp 18   SpO2 98%   BP Readings from Last 3 Encounters:  06/25/22 (!) 165/115  06/13/22 (!) 153/100  06/03/22 132/85   Physical Exam Constitutional:      General: She is not in acute distress.    Appearance: Normal appearance. She is well-developed and normal weight. She is not ill-appearing, toxic-appearing or diaphoretic.  HENT:     Head: Normocephalic and atraumatic.     Right Ear: Tympanic membrane, ear canal and external ear normal. No drainage or tenderness. No middle ear effusion. There is no impacted cerumen. Tympanic membrane is not erythematous or bulging.     Left Ear: Tympanic membrane, ear canal and external ear normal. No drainage or tenderness.  No middle ear effusion. There is no impacted cerumen. Tympanic membrane is not erythematous or bulging.     Nose: Nose normal. No congestion or rhinorrhea.     Mouth/Throat:     Mouth: Mucous membranes are moist. No oral lesions.     Pharynx: No pharyngeal swelling, oropharyngeal exudate, posterior oropharyngeal erythema or uvula swelling.     Tonsils: No tonsillar exudate or tonsillar abscesses.  Eyes:     General: No scleral icterus.       Right eye: No discharge.        Left eye: No discharge.     Extraocular Movements: Extraocular movements intact.     Right eye: Normal extraocular motion.     Left eye: Normal extraocular motion.     Conjunctiva/sclera: Conjunctivae normal.  Cardiovascular:     Rate and Rhythm: Normal rate.  Pulmonary:     Effort: Pulmonary effort is normal.  Musculoskeletal:     Cervical back: Normal range of motion and neck supple.   Lymphadenopathy:     Cervical: No cervical adenopathy.  Skin:    General: Skin is warm and dry.  Neurological:     General: No focal deficit present.     Mental Status: She is alert and oriented to person, place, and time.  Psychiatric:        Mood and Affect: Mood normal.        Behavior: Behavior normal.    Assessment and Plan :   PDMP not reviewed this encounter.  1. Eustachian tube dysfunction,  bilateral   2. ESRD on dialysis [facility])     Unremarkable ENT exam.  Will use conservative management for what I suspect is eustachian tube dysfunction.  Recommended starting Flonase as this is the only safe option given her blood pressure, ESRD. No signs of infection and therefore will defer antibiotic use.  Counseled patient on potential for adverse effects with medications prescribed/recommended today, ER and return-to-clinic precautions discussed, patient verbalized understanding.    Jaynee Eagles, PA-C 06/25/22 1339

## 2022-06-25 NOTE — Progress Notes (Signed)
Rescheduled 06/30/22  [place]  Direct Dial: [phone]

## 2022-06-25 NOTE — ED Triage Notes (Signed)
Pt presents to the office for bilateral ear pain x 3 days. Pt is taking Tylenol for pain.

## 2022-06-26 DIAGNOSIS — N186 End stage renal disease: Secondary | ICD-10-CM | POA: Diagnosis not present

## 2022-06-26 DIAGNOSIS — Z992 Dependence on renal dialysis: Secondary | ICD-10-CM | POA: Diagnosis not present

## 2022-06-26 DIAGNOSIS — N2581 Secondary hyperparathyroidism of renal origin: Secondary | ICD-10-CM | POA: Diagnosis not present

## 2022-06-27 DIAGNOSIS — N186 End stage renal disease: Secondary | ICD-10-CM | POA: Diagnosis not present

## 2022-06-27 DIAGNOSIS — I120 Hypertensive chronic kidney disease with stage 5 chronic kidney disease or end stage renal disease: Secondary | ICD-10-CM | POA: Diagnosis not present

## 2022-06-27 DIAGNOSIS — Z992 Dependence on renal dialysis: Secondary | ICD-10-CM | POA: Diagnosis not present

## 2022-06-28 DIAGNOSIS — N186 End stage renal disease: Secondary | ICD-10-CM | POA: Diagnosis not present

## 2022-06-28 DIAGNOSIS — N2581 Secondary hyperparathyroidism of renal origin: Secondary | ICD-10-CM | POA: Diagnosis not present

## 2022-06-28 DIAGNOSIS — Z992 Dependence on renal dialysis: Secondary | ICD-10-CM | POA: Diagnosis not present

## 2022-06-28 DIAGNOSIS — E8770 Fluid overload, unspecified: Secondary | ICD-10-CM | POA: Diagnosis not present

## 2022-06-30 ENCOUNTER — Ambulatory Visit: Payer: Self-pay

## 2022-06-30 NOTE — Patient Outreach (Signed)
  Care Coordination   Follow Up Visit Note   06/30/2022 Name: [name] MRN: [id number] DOB: 12-22-1990  [name] is a 31 y.o. year old female who sees Rubie Maid, FNP for primary care. I spoke with  [name] by phone today.  What matters to the patients health and wellness today?  Patient continues to need a power W/C for mobility.     Goals Addressed               This Visit's Progress     Patient Stated     "I need a power W/C and a ramp" (pt-stated)        Care Coordination Interventions: Evaluation of current treatment plan related to impaired physical mobility and patient's adherence to plan as established by provider Determined patient has transitioned to [place] [place] FNP for primary care Confirmed PCP sent referral for PT evaluation for power W/C Confirmed with patient she was notified by [place] regarding the need for a power wheel chair Reviewed with patient, once the Rx is received the patient will complete an evaluation at [facility] in order to move forward with obtaining a power chair Determined patient needs assistance obtaining a W/C ramp, she rents her home Reviewed and discussed with patient ramp resource provided by Daneen Schick, BSW vocational rehab for independent living [phone]  [facility] in [place]         I have nausea and vomiting most days (pt-stated)        Care Coordination Interventions: Assessed the Patient understanding of ESRD and associated symptoms Reviewed with patient, multiple ED visits for nausea, vomiting and dizziness Reviewed with patient recent PCP follow up visit with elevated C-peptide  Educated patient regarding basic disease process related to ESRD  Reviewed medications with patient and determined patient uses Zofran for symptom management Encouraged patient to notify PCP of new or worsening symptoms           SDOH assessments and interventions  completed:  No     Care Coordination Interventions:  Yes, provided   Follow up plan: Follow up call scheduled for 07/14/22 '@1100'$  AM    Encounter Outcome:  Pt. Visit Completed

## 2022-06-30 NOTE — Patient Instructions (Signed)
Visit Information  Thank you for taking time to visit with me today. Please don't hesitate to contact me if I can be of assistance to you.   Following are the goals we discussed today:   Goals Addressed               This Visit's Progress     Patient Stated     "I need a power W/C and a ramp" (pt-stated)        Care Coordination Interventions: Evaluation of current treatment plan related to impaired physical mobility and patient's adherence to plan as established by provider Determined patient has transitioned to [place] for primary care Confirmed PCP sent referral for PT evaluation for power W/C Confirmed with patient she was notified by [place] regarding the need for a power wheel chair Reviewed with patient, once the Rx is received the patient will complete an evaluation at [facility] in order to move forward with obtaining a power chair Determined patient needs assistance obtaining a W/C ramp, she rents her home Reviewed and discussed with patient ramp resource provided by Daneen Schick, BSW vocational rehab for independent living [phone]  [facility] in [place]         I have nausea and vomiting most days (pt-stated)        Care Coordination Interventions: Assessed the Patient understanding of ESRD and associated symptoms Reviewed with patient, multiple ED visits for nausea, vomiting and dizziness Reviewed with patient recent PCP follow up visit with elevated C-peptide  Educated patient regarding basic disease process related to ESRD  Reviewed medications with patient and determined patient uses Zofran for symptom management Encouraged patient to notify PCP of new or worsening symptoms           Our next appointment is by telephone on 07/14/22 at 100 PM  Please call the care guide team at [phone] if you need to cancel or reschedule your appointment.   If you are experiencing a Mental Health or [place] or need someone to talk to, please call 1-800-273-TALK (toll free, 24 hour hotline)  Patient verbalizes understanding of instructions and care plan provided today and agrees to view in [place]. Active MyChart status and patient understanding of how to access instructions and care plan via MyChart confirmed with patient.     Barb Merino, RN, BSN, CCM Care Management Coordinator [facility] Care Management Direct Phone: [phone]

## 2022-07-01 DIAGNOSIS — Z992 Dependence on renal dialysis: Secondary | ICD-10-CM | POA: Diagnosis not present

## 2022-07-01 DIAGNOSIS — N186 End stage renal disease: Secondary | ICD-10-CM | POA: Diagnosis not present

## 2022-07-01 DIAGNOSIS — E8770 Fluid overload, unspecified: Secondary | ICD-10-CM | POA: Diagnosis not present

## 2022-07-01 DIAGNOSIS — N2581 Secondary hyperparathyroidism of renal origin: Secondary | ICD-10-CM | POA: Diagnosis not present

## 2022-07-03 DIAGNOSIS — N186 End stage renal disease: Secondary | ICD-10-CM | POA: Diagnosis not present

## 2022-07-03 DIAGNOSIS — N2581 Secondary hyperparathyroidism of renal origin: Secondary | ICD-10-CM | POA: Diagnosis not present

## 2022-07-03 DIAGNOSIS — E8770 Fluid overload, unspecified: Secondary | ICD-10-CM | POA: Diagnosis not present

## 2022-07-03 DIAGNOSIS — Z992 Dependence on renal dialysis: Secondary | ICD-10-CM | POA: Diagnosis not present

## 2022-07-04 ENCOUNTER — Ambulatory Visit: Payer: Medicare Other | Admitting: Physical Therapy

## 2022-07-05 DIAGNOSIS — Z992 Dependence on renal dialysis: Secondary | ICD-10-CM | POA: Diagnosis not present

## 2022-07-05 DIAGNOSIS — N2581 Secondary hyperparathyroidism of renal origin: Secondary | ICD-10-CM | POA: Diagnosis not present

## 2022-07-05 DIAGNOSIS — E8770 Fluid overload, unspecified: Secondary | ICD-10-CM | POA: Diagnosis not present

## 2022-07-05 DIAGNOSIS — N186 End stage renal disease: Secondary | ICD-10-CM | POA: Diagnosis not present

## 2022-07-08 ENCOUNTER — Ambulatory Visit (HOSPITAL_COMMUNITY)
Admission: RE | Admit: 2022-07-08 | Discharge: 2022-07-08 | Disposition: A | Discharge status: Home / Self Care | Payer: Medicare Other | Source: Ambulatory Visit | Attending: Family Medicine | Admitting: Family Medicine

## 2022-07-08 DIAGNOSIS — R011 Cardiac murmur, unspecified: Secondary | ICD-10-CM | POA: Diagnosis not present

## 2022-07-08 DIAGNOSIS — I081 Rheumatic disorders of both mitral and tricuspid valves: Secondary | ICD-10-CM | POA: Diagnosis not present

## 2022-07-08 DIAGNOSIS — N2581 Secondary hyperparathyroidism of renal origin: Secondary | ICD-10-CM | POA: Diagnosis not present

## 2022-07-08 DIAGNOSIS — N186 End stage renal disease: Secondary | ICD-10-CM | POA: Diagnosis not present

## 2022-07-08 DIAGNOSIS — E8770 Fluid overload, unspecified: Secondary | ICD-10-CM | POA: Diagnosis not present

## 2022-07-08 DIAGNOSIS — Z992 Dependence on renal dialysis: Secondary | ICD-10-CM | POA: Diagnosis not present

## 2022-07-08 LAB — ECHOCARDIOGRAM COMPLETE
AR max vel: 1.67 cm2
AV Area VTI: 1.6 cm2
AV Area mean vel: 1.7 cm2
AV Mean grad: 8 mmHg
AV Peak grad: 16.1 mmHg
Ao pk vel: 2.01 m/s
Area-P 1/2: 1.98 cm2
MV VTI: 1 cm2
S' Lateral: 1.7 cm

## 2022-07-08 NOTE — Progress Notes (Incomplete)
*  PRELIMINARY RESULTS* Echocardiogram 2D Echocardiogram has been performed.  [name] 07/08/2022, 3:30 PM

## 2022-07-09 DIAGNOSIS — N2581 Secondary hyperparathyroidism of renal origin: Secondary | ICD-10-CM | POA: Diagnosis not present

## 2022-07-09 DIAGNOSIS — Z992 Dependence on renal dialysis: Secondary | ICD-10-CM | POA: Diagnosis not present

## 2022-07-09 DIAGNOSIS — N186 End stage renal disease: Secondary | ICD-10-CM | POA: Diagnosis not present

## 2022-07-09 DIAGNOSIS — E8770 Fluid overload, unspecified: Secondary | ICD-10-CM | POA: Diagnosis not present

## 2022-07-10 DIAGNOSIS — E8770 Fluid overload, unspecified: Secondary | ICD-10-CM | POA: Diagnosis not present

## 2022-07-10 DIAGNOSIS — N2581 Secondary hyperparathyroidism of renal origin: Secondary | ICD-10-CM | POA: Diagnosis not present

## 2022-07-10 DIAGNOSIS — Z992 Dependence on renal dialysis: Secondary | ICD-10-CM | POA: Diagnosis not present

## 2022-07-10 DIAGNOSIS — N186 End stage renal disease: Secondary | ICD-10-CM | POA: Diagnosis not present

## 2022-07-12 DIAGNOSIS — E8770 Fluid overload, unspecified: Secondary | ICD-10-CM | POA: Diagnosis not present

## 2022-07-12 DIAGNOSIS — Z992 Dependence on renal dialysis: Secondary | ICD-10-CM | POA: Diagnosis not present

## 2022-07-12 DIAGNOSIS — N2581 Secondary hyperparathyroidism of renal origin: Secondary | ICD-10-CM | POA: Diagnosis not present

## 2022-07-12 DIAGNOSIS — N186 End stage renal disease: Secondary | ICD-10-CM | POA: Diagnosis not present

## 2022-07-14 ENCOUNTER — Ambulatory Visit: Payer: Self-pay

## 2022-07-14 NOTE — Patient Instructions (Signed)
Visit Information  Thank you for taking time to visit with me today. Please don't hesitate to contact me if I can be of assistance to you.   Following are the goals we discussed today:   Goals Addressed               This Visit's Progress     Patient Stated     "I need a power W/C and a ramp" (pt-stated)        Care Coordination Interventions: Evaluation of current treatment plan related to impaired physical mobility and patient's adherence to plan as established by provider Determined patient is scheduled to f/u with [facility] for a face to face visit for evaluation of her power W/C on 08/07/22 Reviewed and discussed with patient ramp resource provided by Daneen Schick, BSW vocational rehab for independent living [place] in [place] outbound call to [place] for [place] [phone], left a voice message regarding patient's need for assistance with a W/C ramp, provided contact number for this RN requesting a return call            Our next appointment is by telephone on 08/11/22 at 1:30 PM  Please call the care guide team at [phone] if you need to cancel or reschedule your appointment.   If you are experiencing a Mental Health or [place] or need someone to talk to, please call 1-800-273-TALK (toll free, 24 hour hotline)  Patient verbalizes understanding of instructions and care plan provided today and agrees to view in [place]. Active MyChart status and patient understanding of how to access instructions and care plan via MyChart confirmed with patient.     Barb Merino, RN, BSN, CCM Care Management Coordinator [facility] Care Management  Direct Phone: [phone]

## 2022-07-14 NOTE — Patient Outreach (Signed)
  Care Coordination   Follow Up Visit Note   07/14/2022 Name: [name] MRN: [id number] DOB: Aug 24, 1990  [name] is a 31 y.o. year old female who sees Rubie Maid, FNP for primary care. I spoke with  [name] by phone today.  What matters to the patients health and wellness today?  Patient will complete her face to face visit with [facility] for a face to face visit for W/C evaluation on 08/07/22.     Goals Addressed               This Visit's Progress     Patient Stated     "I need a power W/C and a ramp" (pt-stated)        Care Coordination Interventions: Evaluation of current treatment plan related to impaired physical mobility and patient's adherence to plan as established by provider Determined patient is scheduled to f/u with [facility] PT for a face to face visit for evaluation of her power W/C on 08/07/22 Reviewed and discussed with patient ramp resource provided by Daneen Schick, BSW vocational rehab for independent living [place] in [place] outbound call to [place] for [place] [phone], left a voice message regarding patient's need for assistance with a W/C ramp, provided contact number for this RN requesting a return call            SDOH assessments and interventions completed:  No     Care Coordination Interventions:  Yes, provided   Follow up plan: Follow up call scheduled for 08/11/22 '@1'$ :30 PM    Encounter Outcome:  Pt. Visit Completed

## 2022-07-15 DIAGNOSIS — Z992 Dependence on renal dialysis: Secondary | ICD-10-CM | POA: Diagnosis not present

## 2022-07-15 DIAGNOSIS — N2581 Secondary hyperparathyroidism of renal origin: Secondary | ICD-10-CM | POA: Diagnosis not present

## 2022-07-15 DIAGNOSIS — N186 End stage renal disease: Secondary | ICD-10-CM | POA: Diagnosis not present

## 2022-07-15 DIAGNOSIS — E8770 Fluid overload, unspecified: Secondary | ICD-10-CM | POA: Diagnosis not present

## 2022-07-16 ENCOUNTER — Ambulatory Visit (INDEPENDENT_AMBULATORY_CARE_PROVIDER_SITE_OTHER): Payer: Medicare Other | Admitting: Family Medicine

## 2022-07-16 VITALS — BP 142/82 | HR 81 | Temp 98.8°F | Ht <= 58 in | Wt <= 1120 oz

## 2022-07-16 DIAGNOSIS — R011 Cardiac murmur, unspecified: Secondary | ICD-10-CM

## 2022-07-16 DIAGNOSIS — R931 Abnormal findings on diagnostic imaging of heart and coronary circulation: Secondary | ICD-10-CM

## 2022-07-16 NOTE — Progress Notes (Unsigned)
   Acute Office Visit  Subjective:     Patient ID: [name], female    DOB: 09-16-1990, 31 y.o.   MRN: [id number]  Chief Complaint  Patient presents with   Follow-up    Echo cardiogram    HPI Patient is in today for discussion regarding the results of her recent echocardiogram. Discussed the findings with patient and all questions answered. She denies chest pain, shortness of breath, lightheadedness, or new symptoms.  Review of Systems  All other systems reviewed and are negative.       Objective:    BP (!) 142/82   Pulse 81   Temp 98.8 F (37.1 C) (Oral)   Ht 1' (0.305 m)   Wt 67 lb 10.9 oz (30.7 kg)   SpO2 98%   BMI 330.45 kg/m  {Vitals History (Optional):23777}  Physical Exam Vitals and nursing note reviewed.  Constitutional:      Appearance: Normal appearance. She is normal weight.  HENT:     Head: Normocephalic and atraumatic.  Skin:    General: Skin is warm and dry.  Neurological:     General: No focal deficit present.     Mental Status: She is alert and oriented to person, place, and time. Mental status is at baseline.  Psychiatric:        Mood and Affect: Mood normal.        Behavior: Behavior normal.        Thought Content: Thought content normal.        Judgment: Judgment normal.     No results found for any visits on 07/16/22.      Assessment & Plan:   Problem List Items Addressed This Visit   None Visit Diagnoses     Murmur, cardiac    -  Primary       No orders of the defined types were placed in this encounter.   No follow-ups on file.  Rubie Maid, FNP

## 2022-07-17 ENCOUNTER — Encounter: Payer: Self-pay | Admitting: Family Medicine

## 2022-07-17 DIAGNOSIS — N2581 Secondary hyperparathyroidism of renal origin: Secondary | ICD-10-CM | POA: Diagnosis not present

## 2022-07-17 DIAGNOSIS — R931 Abnormal findings on diagnostic imaging of heart and coronary circulation: Secondary | ICD-10-CM | POA: Insufficient documentation

## 2022-07-17 DIAGNOSIS — Z992 Dependence on renal dialysis: Secondary | ICD-10-CM | POA: Diagnosis not present

## 2022-07-17 DIAGNOSIS — R011 Cardiac murmur, unspecified: Secondary | ICD-10-CM

## 2022-07-17 DIAGNOSIS — E8770 Fluid overload, unspecified: Secondary | ICD-10-CM | POA: Diagnosis not present

## 2022-07-17 DIAGNOSIS — N186 End stage renal disease: Secondary | ICD-10-CM | POA: Diagnosis not present

## 2022-07-17 HISTORY — DX: Cardiac murmur, unspecified: R01.1

## 2022-07-17 NOTE — Assessment & Plan Note (Signed)
Discussed findings with patient including EF 70-75% narrow LVOT and LV hypertrophy. Will refer to cardiology for further management. She denies concerning symptoms.

## 2022-07-19 DIAGNOSIS — Z992 Dependence on renal dialysis: Secondary | ICD-10-CM | POA: Diagnosis not present

## 2022-07-19 DIAGNOSIS — E8770 Fluid overload, unspecified: Secondary | ICD-10-CM | POA: Diagnosis not present

## 2022-07-19 DIAGNOSIS — N186 End stage renal disease: Secondary | ICD-10-CM | POA: Diagnosis not present

## 2022-07-19 DIAGNOSIS — N2581 Secondary hyperparathyroidism of renal origin: Secondary | ICD-10-CM | POA: Diagnosis not present

## 2022-07-22 DIAGNOSIS — N2581 Secondary hyperparathyroidism of renal origin: Secondary | ICD-10-CM | POA: Diagnosis not present

## 2022-07-22 DIAGNOSIS — E8770 Fluid overload, unspecified: Secondary | ICD-10-CM | POA: Diagnosis not present

## 2022-07-22 DIAGNOSIS — N186 End stage renal disease: Secondary | ICD-10-CM | POA: Diagnosis not present

## 2022-07-22 DIAGNOSIS — Z992 Dependence on renal dialysis: Secondary | ICD-10-CM | POA: Diagnosis not present

## 2022-07-23 ENCOUNTER — Ambulatory Visit: Payer: Medicare Other | Admitting: Family Medicine

## 2022-07-24 DIAGNOSIS — N2581 Secondary hyperparathyroidism of renal origin: Secondary | ICD-10-CM | POA: Diagnosis not present

## 2022-07-24 DIAGNOSIS — N186 End stage renal disease: Secondary | ICD-10-CM | POA: Diagnosis not present

## 2022-07-24 DIAGNOSIS — E8770 Fluid overload, unspecified: Secondary | ICD-10-CM | POA: Diagnosis not present

## 2022-07-24 DIAGNOSIS — Z992 Dependence on renal dialysis: Secondary | ICD-10-CM | POA: Diagnosis not present

## 2022-07-26 DIAGNOSIS — N2581 Secondary hyperparathyroidism of renal origin: Secondary | ICD-10-CM | POA: Diagnosis not present

## 2022-07-26 DIAGNOSIS — Z992 Dependence on renal dialysis: Secondary | ICD-10-CM | POA: Diagnosis not present

## 2022-07-26 DIAGNOSIS — E8770 Fluid overload, unspecified: Secondary | ICD-10-CM | POA: Diagnosis not present

## 2022-07-26 DIAGNOSIS — N186 End stage renal disease: Secondary | ICD-10-CM | POA: Diagnosis not present

## 2022-07-28 DIAGNOSIS — Z992 Dependence on renal dialysis: Secondary | ICD-10-CM | POA: Diagnosis not present

## 2022-07-28 DIAGNOSIS — N186 End stage renal disease: Secondary | ICD-10-CM | POA: Diagnosis not present

## 2022-07-28 DIAGNOSIS — I120 Hypertensive chronic kidney disease with stage 5 chronic kidney disease or end stage renal disease: Secondary | ICD-10-CM | POA: Diagnosis not present

## 2022-07-29 DIAGNOSIS — N186 End stage renal disease: Secondary | ICD-10-CM | POA: Diagnosis not present

## 2022-07-29 DIAGNOSIS — E8779 Other fluid overload: Secondary | ICD-10-CM | POA: Diagnosis not present

## 2022-07-29 DIAGNOSIS — D631 Anemia in chronic kidney disease: Secondary | ICD-10-CM | POA: Diagnosis not present

## 2022-07-29 DIAGNOSIS — Z992 Dependence on renal dialysis: Secondary | ICD-10-CM | POA: Diagnosis not present

## 2022-07-29 DIAGNOSIS — N2581 Secondary hyperparathyroidism of renal origin: Secondary | ICD-10-CM | POA: Diagnosis not present

## 2022-07-30 DIAGNOSIS — D631 Anemia in chronic kidney disease: Secondary | ICD-10-CM | POA: Diagnosis not present

## 2022-07-30 DIAGNOSIS — N186 End stage renal disease: Secondary | ICD-10-CM | POA: Diagnosis not present

## 2022-07-30 DIAGNOSIS — Z992 Dependence on renal dialysis: Secondary | ICD-10-CM | POA: Diagnosis not present

## 2022-07-30 DIAGNOSIS — E8779 Other fluid overload: Secondary | ICD-10-CM | POA: Diagnosis not present

## 2022-07-30 DIAGNOSIS — N2581 Secondary hyperparathyroidism of renal origin: Secondary | ICD-10-CM | POA: Diagnosis not present

## 2022-07-31 DIAGNOSIS — N2581 Secondary hyperparathyroidism of renal origin: Secondary | ICD-10-CM | POA: Diagnosis not present

## 2022-07-31 DIAGNOSIS — Z992 Dependence on renal dialysis: Secondary | ICD-10-CM | POA: Diagnosis not present

## 2022-07-31 DIAGNOSIS — N186 End stage renal disease: Secondary | ICD-10-CM | POA: Diagnosis not present

## 2022-07-31 DIAGNOSIS — E8779 Other fluid overload: Secondary | ICD-10-CM | POA: Diagnosis not present

## 2022-07-31 DIAGNOSIS — D631 Anemia in chronic kidney disease: Secondary | ICD-10-CM | POA: Diagnosis not present

## 2022-08-02 DIAGNOSIS — N2581 Secondary hyperparathyroidism of renal origin: Secondary | ICD-10-CM | POA: Diagnosis not present

## 2022-08-02 DIAGNOSIS — Z992 Dependence on renal dialysis: Secondary | ICD-10-CM | POA: Diagnosis not present

## 2022-08-02 DIAGNOSIS — N186 End stage renal disease: Secondary | ICD-10-CM | POA: Diagnosis not present

## 2022-08-02 DIAGNOSIS — E8779 Other fluid overload: Secondary | ICD-10-CM | POA: Diagnosis not present

## 2022-08-02 DIAGNOSIS — D631 Anemia in chronic kidney disease: Secondary | ICD-10-CM | POA: Diagnosis not present

## 2022-08-04 DIAGNOSIS — R6889 Other general symptoms and signs: Secondary | ICD-10-CM | POA: Diagnosis not present

## 2022-08-04 DIAGNOSIS — R2681 Unsteadiness on feet: Secondary | ICD-10-CM | POA: Diagnosis not present

## 2022-08-04 DIAGNOSIS — Z7409 Other reduced mobility: Secondary | ICD-10-CM | POA: Diagnosis not present

## 2022-08-04 DIAGNOSIS — Q057 Lumbar spina bifida without hydrocephalus: Secondary | ICD-10-CM | POA: Diagnosis not present

## 2022-08-05 DIAGNOSIS — E8779 Other fluid overload: Secondary | ICD-10-CM | POA: Diagnosis not present

## 2022-08-05 DIAGNOSIS — N2581 Secondary hyperparathyroidism of renal origin: Secondary | ICD-10-CM | POA: Diagnosis not present

## 2022-08-05 DIAGNOSIS — D631 Anemia in chronic kidney disease: Secondary | ICD-10-CM | POA: Diagnosis not present

## 2022-08-05 DIAGNOSIS — N186 End stage renal disease: Secondary | ICD-10-CM | POA: Diagnosis not present

## 2022-08-05 DIAGNOSIS — Z992 Dependence on renal dialysis: Secondary | ICD-10-CM | POA: Diagnosis not present

## 2022-08-06 ENCOUNTER — Encounter (HOSPITAL_BASED_OUTPATIENT_CLINIC_OR_DEPARTMENT_OTHER): Payer: Self-pay | Admitting: Emergency Medicine

## 2022-08-06 ENCOUNTER — Emergency Department (HOSPITAL_BASED_OUTPATIENT_CLINIC_OR_DEPARTMENT_OTHER)
Admission: EM | Admit: 2022-08-06 | Discharge: 2022-08-06 | Disposition: A | Discharge status: Home / Self Care | Payer: Medicare Other | Attending: Emergency Medicine | Admitting: Emergency Medicine

## 2022-08-06 ENCOUNTER — Other Ambulatory Visit: Payer: Self-pay

## 2022-08-06 ENCOUNTER — Emergency Department (HOSPITAL_BASED_OUTPATIENT_CLINIC_OR_DEPARTMENT_OTHER): Payer: Medicare Other | Admitting: Radiology

## 2022-08-06 DIAGNOSIS — N12 Tubulo-interstitial nephritis, not specified as acute or chronic: Secondary | ICD-10-CM | POA: Diagnosis not present

## 2022-08-06 DIAGNOSIS — N186 End stage renal disease: Secondary | ICD-10-CM | POA: Insufficient documentation

## 2022-08-06 DIAGNOSIS — Z91010 Allergy to peanuts: Secondary | ICD-10-CM | POA: Insufficient documentation

## 2022-08-06 DIAGNOSIS — W050XXA Fall from non-moving wheelchair, initial encounter: Secondary | ICD-10-CM | POA: Insufficient documentation

## 2022-08-06 DIAGNOSIS — I120 Hypertensive chronic kidney disease with stage 5 chronic kidney disease or end stage renal disease: Secondary | ICD-10-CM | POA: Insufficient documentation

## 2022-08-06 DIAGNOSIS — Z992 Dependence on renal dialysis: Secondary | ICD-10-CM | POA: Diagnosis not present

## 2022-08-06 DIAGNOSIS — S59911A Unspecified injury of right forearm, initial encounter: Secondary | ICD-10-CM | POA: Insufficient documentation

## 2022-08-06 DIAGNOSIS — Z91040 Latex allergy status: Secondary | ICD-10-CM | POA: Insufficient documentation

## 2022-08-06 DIAGNOSIS — S4991XA Unspecified injury of right shoulder and upper arm, initial encounter: Secondary | ICD-10-CM

## 2022-08-06 DIAGNOSIS — Z043 Encounter for examination and observation following other accident: Secondary | ICD-10-CM | POA: Diagnosis not present

## 2022-08-06 LAB — CBC
HCT: 32.1 % — ABNORMAL LOW (ref 36.0–46.0)
Hemoglobin: 10.3 g/dL — ABNORMAL LOW (ref 12.0–15.0)
MCH: 31.3 pg (ref 26.0–34.0)
MCHC: 32.1 g/dL (ref 30.0–36.0)
MCV: 97.6 fL (ref 80.0–100.0)
Platelets: 118 10*3/uL — ABNORMAL LOW (ref 150–400)
RBC: 3.29 MIL/uL — ABNORMAL LOW (ref 3.87–5.11)
RDW: 14.7 % (ref 11.5–15.5)
WBC: 5.9 10*3/uL (ref 4.0–10.5)
nRBC: 0 % (ref 0.0–0.2)

## 2022-08-06 LAB — URINALYSIS, ROUTINE W REFLEX MICROSCOPIC
Bilirubin Urine: NEGATIVE
Glucose, UA: NEGATIVE mg/dL
Ketones, ur: NEGATIVE mg/dL
Nitrite: NEGATIVE
Protein, ur: 100 mg/dL — AB
RBC / HPF: >50 RBC/hpf — ABNORMAL HIGH (ref 0–5)
Specific Gravity, Urine: 1.011 (ref 1.005–1.030)
WBC, UA: >50 WBC/hpf — ABNORMAL HIGH (ref 0–5)
pH: 7.5 (ref 5.0–8.0)

## 2022-08-06 LAB — BASIC METABOLIC PANEL
Anion gap: 15 (ref 5–15)
BUN: 37 mg/dL — ABNORMAL HIGH (ref 6–20)
CO2: 29 mmol/L (ref 22–32)
Calcium: 10.6 mg/dL — ABNORMAL HIGH (ref 8.9–10.3)
Chloride: 97 mmol/L — ABNORMAL LOW (ref 98–111)
Creatinine, Ser: 6.28 mg/dL — ABNORMAL HIGH (ref 0.44–1.00)
GFR, Estimated: 9 mL/min — ABNORMAL LOW (ref >60)
Glucose, Bld: 121 mg/dL — ABNORMAL HIGH (ref 70–99)
Potassium: 3.7 mmol/L (ref 3.5–5.1)
Sodium: 141 mmol/L (ref 135–145)

## 2022-08-06 LAB — PREGNANCY, URINE: Preg Test, Ur: NEGATIVE

## 2022-08-06 MED ORDER — CEPHALEXIN 250 MG PO CAPS
500.0000 mg | ORAL_CAPSULE | Freq: Once | ORAL | Status: AC
  Administered 2022-08-06: 500 mg via ORAL
  Filled 2022-08-06: qty 2

## 2022-08-06 MED ORDER — CEPHALEXIN 500 MG PO CAPS: 500.0000 mg | ORAL_CAPSULE | Freq: Two times a day (BID) | ORAL | 0 refills | Status: AC

## 2022-08-06 MED ORDER — ACETAMINOPHEN 500 MG PO TABS
1000.0000 mg | ORAL_TABLET | Freq: Once | ORAL | Status: DC
  Filled 2022-08-06: qty 2

## 2022-08-06 NOTE — ED Provider Notes (Signed)
[place] EMERGENCY DEPT Provider Note  CSN: [id number] Arrival date & time: 08/06/22 0032  Chief Complaint(s) Flank Pain  HPI [name] is a 32 y.o. female with a past medical history listed below including spina bifida who is wheelchair dependent, ESRD on dialysis TTS here for right elbow pain and left flank pain.  Right elbow pain began 2 days ago after she had a mechanical fall off of her wheelchair where she landed with outstretched arms.  No head trauma or loss of consciousness.  Since the fall, she has been complaining of right elbow pain, shoulder pain and wrist pain.  This is worse with range of motion and weightbearing.  She does self transfer and has been able to do so but with more difficulty given the pain.  Left flank pain has been ongoing for 2 days but unrelated to the fall.  She reports that it feels the same as her prior kidney infections.  Patient still makes urine and self caths every other day retrieving approximately 100 cc of urine. No fever, N/V.   Flank Pain    Past Medical History Past Medical History:  Diagnosis Date   Anemia associated with chronic renal failure    Blood transfusion    Caudal regression syndrome    Assoc with spina bifida.   Depression 05/14/2015   Dialysis care    ESRD (end stage renal disease) on dialysis [facility])    GERD (gastroesophageal reflux disease) 01/07/2017   HTN (hypertension) 05/02/2011   Murmur, cardiac 07/17/2022   Spina bifida    UTI (lower urinary tract infection)    Patient Active Problem List   Diagnosis Date Noted   Murmur, cardiac 07/17/2022   Abnormal echocardiogram 07/17/2022   Hypoglycemia without diagnosis of diabetes mellitus 05/13/2022   Sepsis ([place]) 05/08/2020   Dialysis AV fistula malfunction ([place])    UTI (urinary tract infection) 04/29/2020   Acute UTI 05/07/2018   Tobacco use 05/07/2018   Hypokalemia 05/07/2018   GERD (gastroesophageal reflux disease) 01/07/2017   Nausea with  vomiting 01/07/2017   Pyelonephritis 07/31/2015   Back pain 07/31/2015   Abdominal pain 07/31/2015   Constipation 07/31/2015   Major depressive disorder, single episode, severe without psychotic features ([place]) 05/15/2015   Spina bifida ([place]) 01/18/2012   ESRD (end stage renal disease) ([place]) 05/02/2011   Anemia 05/02/2011   HTN (hypertension) 05/02/2011   Home Medication(s) Prior to Admission medications   Medication Sig Start Date End Date Taking? Authorizing Provider  cephALEXin (KEFLEX) 500 MG capsule Take 1 capsule (500 mg total) by mouth 2 (two) times daily for 14 days. 08/06/22 08/20/22 Yes Alahia Whicker, Grayce Sessions, MD  acetaminophen (TYLENOL) 500 MG tablet Take 1,000 mg by mouth every 6 (six) hours as needed for mild pain or headache.    [provider]  AURYXIA 1 GM 210 MG(Fe) tablet Take 630 mg by mouth 3 (three) times daily with meals. 04/15/18   [provider]  B Complex-C-Folic Acid (DIALYVITE TABLET) TABS Take 1 tablet by mouth daily.    [provider]  diphenhydrAMINE (BENADRYL) 25 mg capsule Take 25 mg by mouth daily as needed for allergies.    [provider]  Doxercalciferol (HECTOROL IV) Given at dialysis 01/07/22 01/06/23  [provider]  Epoetin Alfa (EPOGEN IJ) Inject as directed See admin instructions. Every Tuesday, Thursday, Saturday    [provider]  fluticasone (FLONASE) 50 MCG/ACT nasal spray Place 2 sprays into both nostrils daily. 06/25/22   Bess Harvest,  Freida Busman, PA-C  hydrALAZINE (APRESOLINE) 25 MG tablet Take 25 mg by mouth 3 (three) times daily. 04/28/20   [provider]  Methoxy PEG-Epoetin Beta (MIRCERA IJ) Mircera 04/17/22 04/16/23  [provider]  metoprolol succinate (TOPROL-XL) 100 MG 24 hr tablet Take 100 mg by mouth daily. 12/29/19   [provider]  omeprazole (PRILOSEC) 40 MG capsule Take 40 mg by mouth every morning. 01/08/22   [provider]  ondansetron (ZOFRAN) 4 MG  tablet Take 1 tablet (4 mg total) by mouth every 6 (six) hours as needed for nausea or vomiting. 04/04/21   Marcello Fennel, PA-C  ondansetron (ZOFRAN-ODT) 4 MG disintegrating tablet Take 1 tablet (4 mg total) by mouth every 8 (eight) hours as needed. 06/13/22   Long, Wonda Olds, MD  sodium zirconium cyclosilicate (LOKELMA) 5 g packet Take 10 g by mouth every Monday, Wednesday, and Friday. 07/20/21   [provider]                                                                                                                                    Allergies Ciprofloxacin; Other; Peanut-containing drug products; Aleve [naproxen sodium]; Ceftriaxone; Coconut (cocos nucifera); Influenza vaccines; Tetanus toxoid, adsorbed; Tetanus toxoids; and Latex  Review of Systems Review of Systems  Genitourinary:  Positive for flank pain.   As noted in HPI  Physical Exam Vital Signs  I have reviewed the triage vital signs BP (!) 143/88 (BP Location: Right Arm)   Pulse 79   Temp 98.5 F (36.9 C)   Resp 16   Wt 29 kg   SpO2 97%   BMI 312.48 kg/m   Physical Exam Vitals reviewed.  Constitutional:      General: She is not in acute distress.    Appearance: She is well-developed. She is not diaphoretic.  HENT:     Head: Normocephalic and atraumatic.     Right Ear: External ear normal.     Left Ear: External ear normal.     Nose: Nose normal.  Eyes:     General: No scleral icterus.    Conjunctiva/sclera: Conjunctivae normal.  Neck:     Trachea: Phonation normal.  Cardiovascular:     Rate and Rhythm: Normal rate and regular rhythm.     Arteriovenous access: Left arteriovenous access is present. Pulmonary:     Effort: Pulmonary effort is normal. No respiratory distress.     Breath sounds: No stridor.  Abdominal:     General: There is no distension.     Tenderness: There is left CVA tenderness. There is no right CVA tenderness.  Musculoskeletal:        General: Normal range of motion.      Right shoulder: Tenderness present. No swelling, deformity or bony tenderness. Normal range of motion.     Right elbow: No swelling. Normal range of motion. Tenderness present.     Right wrist: Normal  pulse.     Cervical back: Normal range of motion.     Comments: Lower extremity atrophy, congenital (baseline)  Neurological:     Mental Status: She is alert and oriented to person, place, and time.  Psychiatric:        Behavior: Behavior normal.     ED Results and Treatments Labs (all labs ordered are listed, but only abnormal results are displayed) Labs Reviewed  URINALYSIS, ROUTINE W REFLEX MICROSCOPIC - Abnormal; Notable for the following components:      Result Value   Color, Urine ORANGE (*)    APPearance CLOUDY (*)    Hgb urine dipstick MODERATE (*)    Protein, ur 100 (*)    Leukocytes,Ua LARGE (*)    RBC / HPF >50 (*)    WBC, UA >50 (*)    Bacteria, UA MANY (*)    All other components within normal limits  BASIC METABOLIC PANEL - Abnormal; Notable for the following components:   Chloride 97 (*)    Glucose, Bld 121 (*)    BUN 37 (*)    Creatinine, Ser 6.28 (*)    Calcium 10.6 (*)    GFR, Estimated 9 (*)    All other components within normal limits  CBC - Abnormal; Notable for the following components:   RBC 3.29 (*)    Hemoglobin 10.3 (*)    HCT 32.1 (*)    Platelets 118 (*)    All other components within normal limits  PREGNANCY, URINE                                                                                                                         EKG  EKG Interpretation  Date/Time:    Ventricular Rate:    PR Interval:    QRS Duration:   QT Interval:    QTC Calculation:   R Axis:     Text Interpretation:         Radiology DG ELBOW COMPLETE RIGHT (3+VIEW)  Result Date: 08/06/2022 CLINICAL DATA:  Initial evaluation for acute trauma, fall. EXAM: RIGHT ELBOW - COMPLETE 3+ VIEW COMPARISON:  None Available. FINDINGS: No acute fracture or  dislocation. No joint effusion. 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 Osseous mineralization normal. No soft tissue abnormality. IMPRESSION: 1. No acute osseous abnormality about the elbow. 2. Mild/early osteoarthritic changes about the elbow. Electronically Signed   By: Jeannine Boga M.D.   On: 08/06/2022 05:07   DG Wrist Complete Right  Result Date: 08/06/2022 CLINICAL DATA:  Initial evaluation for acute trauma, fall. EXAM: RIGHT WRIST - COMPLETE 3+ VIEW COMPARISON:  None Available. FINDINGS: There is no evidence of fracture or dislocation. There is no evidence of arthropathy or other focal bone abnormality. Soft tissues are unremarkable. IMPRESSION: No acute osseous abnormality about the right wrist. Electronically Signed   By: Jeannine Boga M.D.   On: 08/06/2022 05:05   DG Shoulder Right  Result Date: 08/06/2022 CLINICAL DATA:  Initial evaluation for acute trauma, fall. EXAM: RIGHT SHOULDER - 2+ VIEW COMPARISON:  None Available. FINDINGS: No acute fracture dislocation. Humeral head is somewhat high-riding relative to the glenoid. Minimal spurring at the inferior margin of the glenoid. Osseous mineralization normal. No periarticular soft tissue abnormality. Visualized right hemithorax is clear. IMPRESSION: 1. No acute fracture or dislocation. 2. Humeral head is somewhat high-riding relative to the glenoid, which can be seen with rotator cuff pathology. Electronically Signed   By: Jeannine Boga M.D.   On: 08/06/2022 05:03    Medications Ordered in ED Medications  acetaminophen (TYLENOL) tablet 1,000 mg (has no administration in time range)  cephALEXin (KEFLEX) capsule 500 mg (500 mg Oral Given 08/06/22 0530)                                                                                                                                     Procedures Procedures  (including critical care time)  Medical Decision Making / ED Course   Medical Decision  Making Amount and/or Complexity of Data Reviewed Labs: ordered. Decision-making details documented in ED Course. Radiology: ordered and independent interpretation performed. Decision-making details documented in ED Course.  Risk OTC drugs. Prescription drug management.    Fall: Plain films w/o evidence of fracture or dislocation. Likely sprain/strain. Supportive management recommended.  Flank pain: Patient reports that is consistent with her prior kidney infections.  Will obtain UA and assess for infection.  She was allowed to self cath. UA consistent with UTI.  CBC without leukocytosis or severe anemia.  Metabolic panel without significant electrolyte derangements.  Baseline renal function consistent with her ESRD.  Not consistent with sepsis.  Will treat for pyelonephritis.      Final Clinical Impression(s) / ED Diagnoses Final diagnoses:  Arm injury, right, initial encounter  Pyelonephritis   The patient appears reasonably screened and/or stabilized for discharge and I doubt any other medical condition or other [facility] requiring further screening, evaluation, or treatment in the ED at this time. I have discussed the findings, Dx and Tx plan with the patient/family who expressed understanding and agree(s) with the plan. Discharge instructions discussed at length. The patient/family was given strict return precautions who verbalized understanding of the instructions. No further questions at time of discharge.  Disposition: Discharge  Condition: Good  ED Discharge Orders          Ordered    cephALEXin (KEFLEX) 500 MG capsule  2 times daily        08/06/22 0545             Follow Up: Rubie Maid, FNP [address] [place] [address] [phone]  Call  to schedule an appointment for close follow up           This chart was dictated using voice recognition software.  Despite best efforts to proofread,  errors can occur which can change the  documentation meaning.    Fatima Blank, MD 08/06/22 [phone]

## 2022-08-06 NOTE — ED Notes (Signed)
Urine sample obtained by self-cath.

## 2022-08-06 NOTE — Discharge Instructions (Addendum)
For pain control you may take 1000 mg of Tylenol every 8 hours as needed.  

## 2022-08-06 NOTE — ED Triage Notes (Signed)
Pt c/o left sided flank pain x 2 days. Pt states that she self caths. Pt also c/o right shoulder and right elbow pain since Monday when she fell.

## 2022-08-07 DIAGNOSIS — E8779 Other fluid overload: Secondary | ICD-10-CM | POA: Diagnosis not present

## 2022-08-07 DIAGNOSIS — N2581 Secondary hyperparathyroidism of renal origin: Secondary | ICD-10-CM | POA: Diagnosis not present

## 2022-08-07 DIAGNOSIS — D631 Anemia in chronic kidney disease: Secondary | ICD-10-CM | POA: Diagnosis not present

## 2022-08-07 DIAGNOSIS — N186 End stage renal disease: Secondary | ICD-10-CM | POA: Diagnosis not present

## 2022-08-07 DIAGNOSIS — Z992 Dependence on renal dialysis: Secondary | ICD-10-CM | POA: Diagnosis not present

## 2022-08-08 ENCOUNTER — Ambulatory Visit: Payer: Medicare Other | Admitting: Internal Medicine

## 2022-08-09 DIAGNOSIS — E8779 Other fluid overload: Secondary | ICD-10-CM | POA: Diagnosis not present

## 2022-08-09 DIAGNOSIS — N2581 Secondary hyperparathyroidism of renal origin: Secondary | ICD-10-CM | POA: Diagnosis not present

## 2022-08-09 DIAGNOSIS — D631 Anemia in chronic kidney disease: Secondary | ICD-10-CM | POA: Diagnosis not present

## 2022-08-09 DIAGNOSIS — N186 End stage renal disease: Secondary | ICD-10-CM | POA: Diagnosis not present

## 2022-08-09 DIAGNOSIS — Z992 Dependence on renal dialysis: Secondary | ICD-10-CM | POA: Diagnosis not present

## 2022-08-11 ENCOUNTER — Telehealth: Payer: Self-pay

## 2022-08-11 NOTE — Telephone Encounter (Signed)
Rec' fax forms from [employer] re: Counsellor, completed form per [employer], [place]. Put up front to be fax back to [facility].

## 2022-08-12 DIAGNOSIS — E8779 Other fluid overload: Secondary | ICD-10-CM | POA: Diagnosis not present

## 2022-08-12 DIAGNOSIS — N186 End stage renal disease: Secondary | ICD-10-CM | POA: Diagnosis not present

## 2022-08-12 DIAGNOSIS — D631 Anemia in chronic kidney disease: Secondary | ICD-10-CM | POA: Diagnosis not present

## 2022-08-12 DIAGNOSIS — Z992 Dependence on renal dialysis: Secondary | ICD-10-CM | POA: Diagnosis not present

## 2022-08-12 DIAGNOSIS — N2581 Secondary hyperparathyroidism of renal origin: Secondary | ICD-10-CM | POA: Diagnosis not present

## 2022-08-14 DIAGNOSIS — Z992 Dependence on renal dialysis: Secondary | ICD-10-CM | POA: Diagnosis not present

## 2022-08-14 DIAGNOSIS — N2581 Secondary hyperparathyroidism of renal origin: Secondary | ICD-10-CM | POA: Diagnosis not present

## 2022-08-14 DIAGNOSIS — E8779 Other fluid overload: Secondary | ICD-10-CM | POA: Diagnosis not present

## 2022-08-14 DIAGNOSIS — N186 End stage renal disease: Secondary | ICD-10-CM | POA: Diagnosis not present

## 2022-08-14 DIAGNOSIS — D631 Anemia in chronic kidney disease: Secondary | ICD-10-CM | POA: Diagnosis not present

## 2022-08-16 DIAGNOSIS — N2581 Secondary hyperparathyroidism of renal origin: Secondary | ICD-10-CM | POA: Diagnosis not present

## 2022-08-16 DIAGNOSIS — D631 Anemia in chronic kidney disease: Secondary | ICD-10-CM | POA: Diagnosis not present

## 2022-08-16 DIAGNOSIS — N186 End stage renal disease: Secondary | ICD-10-CM | POA: Diagnosis not present

## 2022-08-16 DIAGNOSIS — E8779 Other fluid overload: Secondary | ICD-10-CM | POA: Diagnosis not present

## 2022-08-16 DIAGNOSIS — Z992 Dependence on renal dialysis: Secondary | ICD-10-CM | POA: Diagnosis not present

## 2022-08-19 ENCOUNTER — Ambulatory Visit: Payer: Self-pay

## 2022-08-19 DIAGNOSIS — D631 Anemia in chronic kidney disease: Secondary | ICD-10-CM | POA: Diagnosis not present

## 2022-08-19 DIAGNOSIS — N186 End stage renal disease: Secondary | ICD-10-CM | POA: Diagnosis not present

## 2022-08-19 DIAGNOSIS — Z992 Dependence on renal dialysis: Secondary | ICD-10-CM | POA: Diagnosis not present

## 2022-08-19 DIAGNOSIS — E8779 Other fluid overload: Secondary | ICD-10-CM | POA: Diagnosis not present

## 2022-08-19 DIAGNOSIS — N2581 Secondary hyperparathyroidism of renal origin: Secondary | ICD-10-CM | POA: Diagnosis not present

## 2022-08-19 NOTE — Patient Outreach (Signed)
  Care Coordination   08/19/2022 Name: [name] MRN: [id number] DOB: 26-Nov-1990   Care Coordination Outreach Attempts:  An unsuccessful telephone outreach was attempted for a scheduled appointment today.  Follow Up Plan:  Additional outreach attempts will be made to offer the patient care coordination information and services.   Encounter Outcome:  No Answer   Care Coordination Interventions:  No, not indicated    Barb Merino, RN, BSN, CCM Care Management Coordinator [facility] Care Management  Direct Phone: [phone]

## 2022-08-21 ENCOUNTER — Ambulatory Visit: Payer: Medicare Other | Attending: Internal Medicine | Admitting: Internal Medicine

## 2022-08-21 ENCOUNTER — Encounter: Payer: Self-pay | Admitting: Internal Medicine

## 2022-08-21 VITALS — BP 129/88 | HR 75

## 2022-08-21 DIAGNOSIS — I10 Essential (primary) hypertension: Secondary | ICD-10-CM | POA: Diagnosis not present

## 2022-08-21 DIAGNOSIS — Q057 Lumbar spina bifida without hydrocephalus: Secondary | ICD-10-CM | POA: Insufficient documentation

## 2022-08-21 DIAGNOSIS — N186 End stage renal disease: Secondary | ICD-10-CM | POA: Diagnosis not present

## 2022-08-21 DIAGNOSIS — K219 Gastro-esophageal reflux disease without esophagitis: Secondary | ICD-10-CM | POA: Insufficient documentation

## 2022-08-21 DIAGNOSIS — R112 Nausea with vomiting, unspecified: Secondary | ICD-10-CM | POA: Diagnosis not present

## 2022-08-21 DIAGNOSIS — N2581 Secondary hyperparathyroidism of renal origin: Secondary | ICD-10-CM | POA: Diagnosis not present

## 2022-08-21 DIAGNOSIS — Z992 Dependence on renal dialysis: Secondary | ICD-10-CM | POA: Diagnosis not present

## 2022-08-21 DIAGNOSIS — I429 Cardiomyopathy, unspecified: Secondary | ICD-10-CM | POA: Insufficient documentation

## 2022-08-21 DIAGNOSIS — E8779 Other fluid overload: Secondary | ICD-10-CM | POA: Diagnosis not present

## 2022-08-21 DIAGNOSIS — D631 Anemia in chronic kidney disease: Secondary | ICD-10-CM | POA: Diagnosis not present

## 2022-08-21 DIAGNOSIS — R931 Abnormal findings on diagnostic imaging of heart and coronary circulation: Secondary | ICD-10-CM | POA: Diagnosis not present

## 2022-08-21 NOTE — Progress Notes (Signed)
Cardiology Office Note:    Date:  08/21/2022   ID:  TIM SEVY, DOB February 03, 1991, MRN [id number]  PCP:  Rubie Maid, FNP  Cardiologist:  Elouise Munroe, MD  Electrophysiologist:  None   Referring MD: Rubie Maid, FNP   Chief Complaint/Reason for Referral: Abnormal Echo, Murmur  History of Present Illness:    [name] is a 32 y.o. female with a history of cardiac murmur, hypertension, anemia, GERD, ESRD, spina bifida, and caudal regression syndrome, here for the evaluation of abnormal echocardiogram and heart murmur.  At her initial appointment with her PCP 04/30/22 an echocardiogram was ordered indicated by cardiac murmur heard on exam. She followed up with her PCP Mila Merry, [place] on 07/16/2022 for discussion of her echocardiogram 07/08/2022. Her echo was notable for LVEF 70-75%, narrow LVOT and LV hypertrophy. She was referred to cardiology for further management. She denied concerning symptoms.   Today, she is accompanied by her boyfriend. She is not feeling well. Her blood sugar was tested at 116 during her visit today, which she notes is normal for her. She believes her blood pressure may be contributing to how she feels: she had used the restroom just before the visit and transferred from her wheelchair, contributing to her distress. Initially in clinic her BP was 129/88, it is normally a bit higher at home (140s on non-dialysis days). In the middle of her visit, she began to feel dizzy. Her BP was rechecked and found to be at 144/90. Usually she will drink fluids and take salt tablets when her blood pressures are too low; she becomes symptomatic closer to 99991111 systolic. She has been on metoprolol for about 1 year.  Earlier today she did have dialysis treatment. She is feeling worse than she usually does following dialysis. Initially started on dialysis when she was 32 yo. 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  Mostly she  feels fatigued in general, and complains of frequent muscle spasms. Additionally she is bothered by a painful raised lesion of her left clavicular region. She reports having prior workup that revealed no evidence of thrombus.  This is palpated personally today, not suggestive of worrisome lymphadenopathy either.  During the day she suggest she is sedentary. Transferring to and from her wheelchair is generally her most strenuous activity; usually she is able to do so without shortness of breath. Only has SOB with significant fluid retention. She states that she sleeps frequently.  I was called back into the room after our visit had been completed given patient's concerns of vaginal bleeding.  She informs me that she has female reproductive anatomy including uterus, cervix, ovaries, and vagina.  She does follow with a gynecologist, and I have informed her that this is outside the scope of my practice and would strongly recommend the usual evaluation for dysfunctional bleeding to be pursued with gynecology.  She denies being currently sexually active and does not suspect current pregnancy.  She is currently on Eliquis.  The patient denies chest pain, chest pressure, palpitations, PND, orthopnea.  Denies cough, fever, chills.  Denies syncope.  Denies snoring.    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   Depression 05/14/2015   Dialysis care    ESRD (end stage renal disease) on dialysis [facility])    GERD (gastroesophageal reflux  disease) 01/07/2017   HTN (hypertension) 05/02/2011   Murmur, cardiac 07/17/2022   Spina bifida    UTI (lower urinary tract infection)     Past Surgical History:  Procedure Laterality Date   A/V FISTULAGRAM Left 10/14/2021   Procedure: A/V Fistulagram;  Surgeon: [name], MD;  Location: [place];  Service: Cardiovascular;   Laterality: Left;   AV FISTULA PLACEMENT     left arm   INSERTION OF DIALYSIS CATHETER N/A 05/08/2020   Procedure: ATTEMPTED, UNSUCCESSFUL INSERTION OF DIALYSIS CATHETER TUNNELED RIGHT INTERNAL JUGULAR;  Surgeon: Virl Cagey, MD;  Location: [facility];  Service: General;  Laterality: N/A;   IR FLUORO GUIDE CV LINE RIGHT  05/09/2020   IR US GUIDE VASC ACCESS RIGHT  05/09/2020    Current Medications: Current Meds  Medication Sig   acetaminophen (TYLENOL) 500 MG tablet Take 1,000 mg by mouth every 6 (six) hours as needed for mild pain or headache.   AURYXIA 1 GM 210 MG(Fe) tablet Take 630 mg by mouth 3 (three) times daily with meals.   B Complex-C-Folic Acid (DIALYVITE TABLET) TABS Take 1 tablet by mouth daily.   diphenhydrAMINE (BENADRYL) 25 mg capsule Take 25 mg by mouth daily as needed for allergies.   Doxercalciferol (HECTOROL IV) Given at dialysis   Epoetin Alfa (EPOGEN IJ) Inject as directed See admin instructions. Every Tuesday, Thursday, Saturday   hydrALAZINE (APRESOLINE) 25 MG tablet Take 25 mg by mouth 3 (three) times daily.   Methoxy PEG-Epoetin Beta (MIRCERA IJ) Mircera   metoprolol succinate (TOPROL-XL) 100 MG 24 hr tablet Take 100 mg by mouth daily.   omeprazole (PRILOSEC) 40 MG capsule Take 40 mg by mouth every morning.   ondansetron (ZOFRAN) 4 MG tablet Take 1 tablet (4 mg total) by mouth every 6 (six) hours as needed for nausea or vomiting.   ondansetron (ZOFRAN-ODT) 4 MG disintegrating tablet Take 1 tablet (4 mg total) by mouth every 8 (eight) hours as needed.   sodium zirconium cyclosilicate (LOKELMA) 5 g packet Take 10 g by mouth every Monday, Wednesday, and Friday.     Allergies:   Ciprofloxacin; Other; Peanut-containing drug products; Aleve [naproxen sodium]; Ceftriaxone; Coconut (cocos nucifera); Influenza vaccines; Tetanus toxoid, adsorbed; Tetanus toxoids; and Latex   Social History   Tobacco Use   Smoking status: Former    Packs/day: 0.10    Types:  Cigarettes   Smokeless tobacco: Never   Tobacco comments:    2 cigs a day  Substance Use Topics   Alcohol use: No   Drug use: Yes    Types: Marijuana     Family History: The patient's family history includes Arthritis in her maternal grandmother; Breast cancer in her maternal aunt; Hypertension in her maternal grandmother; Kidney cancer in an other family member. There is no history of Colon cancer.  ROS:   Please see the history of present illness.    (+) Dizziness (+) Frequent sleeping All other systems reviewed and are negative.  EKGs/Labs/Other Studies Reviewed:    The following studies were reviewed today:  Echocardiogram  07/08/2022:  1. Left ventricular ejection fraction, by estimation, is 70 to 75%. The  left ventricle has hyperdynamic function. The left ventricle has no  regional wall motion abnormalities. There is moderate concentric left  ventricular hypertrophy. Left ventricular  diastolic parameters are indeterminate. There is the interventricular  septum is flattened in systole and diastole, consistent with right  ventricular pressure and volume overload.   2. Narrow  LVOT with mild resting gradient (14 mmHg peak).   3. Right ventricular systolic function is low normal. The right  ventricular size is mildly enlarged. There is severely elevated pulmonary  artery systolic pressure. The estimated right ventricular systolic  pressure is 123456 mmHg.   4. Left atrial size was severely dilated.   5. Right atrial size was severely dilated.   6. The mitral valve is degenerative, Carpentier class IIIa. Mild mitral  valve regurgitation. Moderate mitral stenosis. The mean mitral valve  gradient is 8.5 mmHg at HR 74 bpm.   7. Tricuspid valve regurgitation is moderate.   8. The aortic valve is tricuspid. Aortic valve regurgitation is not  visualized. Aortic valve mean gradient measures 8.0 mmHg.   9. The inferior vena cava is normal in size with <50% respiratory   variability, suggesting right atrial pressure of 8 mmHg.  10. Cannot exclude a small PFO.   Comparison(s): No prior Echocardiogram. Systolic murmur likely contributed  to by vigorous LVEF with mild LVOT gradient (no aortic stenosis) and also  tricuspid regurgitation. Mitral valve is degenerative/calcified with  evidence of moderate mitral stenosis as well. There is severe pulmonary hypertension.    EKG:  EKG is personally reviewed. 08/21/2022: Sinus rhythm. LVH. Borderline prolonged QT interval.  Imaging studies that I have independently reviewed today: Echocardiogram personally reviewed interpretation below.  Recent Labs: 04/30/2022: TSH 1.95 06/13/2022: ALT 50 08/06/2022: BUN 37; Creatinine, Ser 6.28; Hemoglobin 10.3; Platelets 118; Potassium 3.7; Sodium 141   Recent Lipid Panel    Component Value Date/Time   CHOL 144 04/30/2022 1102   TRIG 137 04/30/2022 1102   HDL 35 (L) 04/30/2022 1102   CHOLHDL 4.1 04/30/2022 1102   LDLCALC 85 04/30/2022 1102    Physical Exam:    VS:  BP 129/88   Pulse 75   SpO2 100%     Wt Readings from Last 5 Encounters:  08/06/22 64 lb (29 kg)  07/16/22 67 lb 10.9 oz (30.7 kg)  06/13/22 64 lb (29 kg)  05/13/22 64 lb (29 kg)  04/30/22 60 lb (27.2 kg)    Constitutional: No acute distress; In wheelchair. Eyes: sclera non-icteric, normal conjunctiva and lids ENMT: normal dentition, moist mucous membranes Cardiovascular: regular rhythm, normal rate, 2/6 systolic murmur. S1 and S2 normal. No jugular venous distention.  Respiratory: clear to auscultation bilaterally GI : normal bowel sounds, soft and nontender. No distention.   MSK: Upper extremities are warm, no edema, dialysis fistula in place.  Absent lower extremities. NEURO: grossly nonfocal exam with absent lower extremities. PSYCH: alert and oriented x 3, normal mood and affect.   ASSESSMENT:    1. Cardiomyopathy, unspecified type ([place])   2. Hypertension, unspecified type   3.  Gastroesophageal reflux disease, unspecified whether esophagitis present   4. Spina bifida of lumbosacral region, unspecified hydrocephalus presence ([place])   5. Nausea and vomiting, unspecified vomiting type   6. ESRD (end stage renal disease) ([place])   7. Abnormal echocardiogram    PLAN:    Echocardiogram images and report reviewed, agree that LVEF is hyperdynamic 70-75, with moderate to severe left ventricular hypertrophy.  Probable grade 2 diastolic dysfunction with a medial E prime of 5-6 cm/s, EA fusion may render this indeterminate as reported.  D-shaped septum in the setting of RV pressure overload, intracavitary gradient due to hyperdynamic function and LVH.  Severe left atrial enlargement.  Mild RV dysfunction with moderate RV dilation, and severely elevated RVSP of 85 mmHg, moderate to severe TR  with evidence of hepatic vein system reversals.  IVC is borderline dilated with no inspiratory collapse suggestive of an RA pressure of approximately 10 mmHg.  Mitral valve is abnormally thickened and calcified, with a MS gradient of 9 mmHg at a heart rate of 73.  This suggests moderate severe if not frankly severe mitral valve stenosis.  Pressure half-time was calculated, mitral valve area approximately 2.5 cm a second, but unsure how reliable this is in the setting of calcifications.    This is an extremely challenging situation given her young age and comorbid illness.  I suspect her mitral stenosis is playing a role in her symptoms however her hemodynamics are impacted by the fact that she has an AV fistula and is on dialysis now for greater than 10 years.  This is likely contributing to changes in left ventricular hypertrophy, and likely to the progression of calcific mitral valve disease.  This in turn is complicating right ventricular function creating tricuspid valve regurgitation, RV and RA dilation, and severely elevated pulmonary artery pressures.  I am unsure how much of her pathophysiology is  reversible at this point.  She likely needs a right heart catheterization to better understand the interplay of the hemodynamics.  I have therefore made the recommendation that she pursue consultation with advanced heart failure for diastolic left-sided heart failure, valvular heart disease with mitral stenosis, and pulmonary hypertension likely driven by cardiac issues.  She understands that there is no easy fix particularly being a dialysis patient, I hope that we can at least optimize her so that she may feel better on a day-to-day basis.  She feels very unwell today and I am extremely hesitant to make any medication changes in light of this.  Blood pressure is reasonably well-controlled and she is normotensive.  All questions answered to the best my ability,   Total time of encounter: 60 minutes total time of encounter, including 40 minutes spent in face-to-face patient care on the date of this encounter. This time includes coordination of care and counseling regarding above mentioned problem list. Remainder of non-face-to-face time involved reviewing chart documents/testing relevant to the patient encounter and documentation in the medical record. I have independently reviewed documentation from referring provider.   Cherlynn Kaiser, MD, [place]   Shared Decision Making/Informed Consent:       Medication Adjustments/Labs and Tests Ordered: Current medicines are reviewed at length with the patient today.  Concerns regarding medicines are outlined above.   Orders Placed This Encounter  Procedures   AMB referral to CHF clinic   EKG 12-Lead   No orders of the defined types were placed in this encounter.  Patient Instructions  Medication Instructions:  No Changes In Medications at this time.  *If you need a refill on your cardiac medications before your next appointment, please call your pharmacy*  Follow-Up: At [facility], you and your health needs  are our priority.  As part of our continuing mission to provide you with exceptional heart care, we have created designated Provider Care Teams.  These Care Teams include your primary Cardiologist (physician) and Advanced Practice Providers (APPs -  Physician Assistants and Nurse Practitioners) who all work together to provide you with the care you need, when you need it.  Your next appointment:   TO BE DETERMINED  Provider:   Elouise Munroe, MD     [place] OUT TO YOU TO GET THIS SCHEDULED  I,Mathew Stumpf,acting as a Education administrator for Elouise Munroe, MD.,have documented all relevant documentation on the behalf of Elouise Munroe, MD,as directed by  Elouise Munroe, MD while in the presence of Elouise Munroe, MD.  I, Elouise Munroe, MD, have reviewed all documentation for the visit on 08/21/2022. The documentation on today's date of service for the exam, diagnosis, procedures, and orders are all accurate and complete.

## 2022-08-21 NOTE — Patient Instructions (Signed)
Medication Instructions:  No Changes In Medications at this time.  *If you need a refill on your cardiac medications before your next appointment, please call your pharmacy*  Follow-Up: At [facility], you and your health needs are our priority.  As part of our continuing mission to provide you with exceptional heart care, we have created designated Provider Care Teams.  These Care Teams include your primary Cardiologist (physician) and Advanced Practice Providers (APPs -  Physician Assistants and Nurse Practitioners) who all work together to provide you with the care you need, when you need it.  Your next appointment:   TO BE DETERMINED  Provider:   Elouise Munroe, MD     [place] OUT TO YOU TO GET THIS SCHEDULED

## 2022-08-23 DIAGNOSIS — D631 Anemia in chronic kidney disease: Secondary | ICD-10-CM | POA: Diagnosis not present

## 2022-08-23 DIAGNOSIS — E8779 Other fluid overload: Secondary | ICD-10-CM | POA: Diagnosis not present

## 2022-08-23 DIAGNOSIS — N2581 Secondary hyperparathyroidism of renal origin: Secondary | ICD-10-CM | POA: Diagnosis not present

## 2022-08-23 DIAGNOSIS — Z992 Dependence on renal dialysis: Secondary | ICD-10-CM | POA: Diagnosis not present

## 2022-08-23 DIAGNOSIS — N186 End stage renal disease: Secondary | ICD-10-CM | POA: Diagnosis not present

## 2022-08-26 DIAGNOSIS — D631 Anemia in chronic kidney disease: Secondary | ICD-10-CM | POA: Diagnosis not present

## 2022-08-26 DIAGNOSIS — N2581 Secondary hyperparathyroidism of renal origin: Secondary | ICD-10-CM | POA: Diagnosis not present

## 2022-08-26 DIAGNOSIS — N186 End stage renal disease: Secondary | ICD-10-CM | POA: Diagnosis not present

## 2022-08-26 DIAGNOSIS — E8779 Other fluid overload: Secondary | ICD-10-CM | POA: Diagnosis not present

## 2022-08-26 DIAGNOSIS — Z992 Dependence on renal dialysis: Secondary | ICD-10-CM | POA: Diagnosis not present

## 2022-08-28 ENCOUNTER — Telehealth: Payer: Self-pay | Admitting: *Deleted

## 2022-08-28 DIAGNOSIS — N186 End stage renal disease: Secondary | ICD-10-CM | POA: Diagnosis not present

## 2022-08-28 DIAGNOSIS — I120 Hypertensive chronic kidney disease with stage 5 chronic kidney disease or end stage renal disease: Secondary | ICD-10-CM | POA: Diagnosis not present

## 2022-08-28 DIAGNOSIS — N2581 Secondary hyperparathyroidism of renal origin: Secondary | ICD-10-CM | POA: Diagnosis not present

## 2022-08-28 DIAGNOSIS — Z992 Dependence on renal dialysis: Secondary | ICD-10-CM | POA: Diagnosis not present

## 2022-08-28 DIAGNOSIS — D631 Anemia in chronic kidney disease: Secondary | ICD-10-CM | POA: Diagnosis not present

## 2022-08-28 NOTE — Progress Notes (Signed)
  Care Coordination Note  08/28/2022 Name: ASYIA HORNUNG MRN: [id number] DOB: [birth year]  [name] is a 32 y.o. year old female who is a primary care patient of Rubie Maid, FNP and is actively engaged with the care management team. I reached out to [name] by phone today to assist with re-scheduling a follow up visit with the RN Case Manager  Follow up plan: Unsuccessful telephone outreach attempt made. A HIPAA compliant phone message was left for the patient providing contact information and requesting a return call.   [place]  Direct Dial: [phone]

## 2022-08-30 DIAGNOSIS — D631 Anemia in chronic kidney disease: Secondary | ICD-10-CM | POA: Diagnosis not present

## 2022-08-30 DIAGNOSIS — N2581 Secondary hyperparathyroidism of renal origin: Secondary | ICD-10-CM | POA: Diagnosis not present

## 2022-08-30 DIAGNOSIS — N186 End stage renal disease: Secondary | ICD-10-CM | POA: Diagnosis not present

## 2022-08-30 DIAGNOSIS — Z992 Dependence on renal dialysis: Secondary | ICD-10-CM | POA: Diagnosis not present

## 2022-09-01 ENCOUNTER — Ambulatory Visit
Admission: EM | Admit: 2022-09-01 | Discharge: 2022-09-01 | Disposition: A | Discharge status: Home / Self Care | Payer: Medicare Other | Attending: Internal Medicine | Admitting: Internal Medicine

## 2022-09-01 DIAGNOSIS — R6889 Other general symptoms and signs: Secondary | ICD-10-CM | POA: Diagnosis not present

## 2022-09-01 DIAGNOSIS — Z1152 Encounter for screening for COVID-19: Secondary | ICD-10-CM | POA: Diagnosis not present

## 2022-09-01 DIAGNOSIS — R509 Fever, unspecified: Secondary | ICD-10-CM | POA: Diagnosis not present

## 2022-09-01 MED ORDER — ACETAMINOPHEN 325 MG PO TABS
650.0000 mg | ORAL_TABLET | Freq: Once | ORAL | Status: AC
  Administered 2022-09-01: 650 mg via ORAL

## 2022-09-01 MED ORDER — ALBUTEROL SULFATE HFA 108 (90 BASE) MCG/ACT IN AERS: 1.0000 | INHALATION_SPRAY | Freq: Four times a day (QID) | RESPIRATORY_TRACT | 0 refills | Status: DC | PRN

## 2022-09-01 NOTE — Discharge Instructions (Signed)
Your symptoms appear consistent with flulike illness.  Tylenol as needed for fever management Rest and fluids Please follow-up with your PCP in 1 to 2 days for recheck Please go to the emergency room ASAP if you have any worsening symptoms.  This includes was not limited to inability to control your fever, inability to stay hydrated, or any new concerns that arise

## 2022-09-01 NOTE — ED Provider Notes (Signed)
[facility]    CSN: [id number] Arrival date & time: 09/01/22  1522      History   Chief Complaint Chief Complaint  Patient presents with   Chills   Cough   Nasal Congestion   Vomiting   Diarrhea    HPI [name] is a 32 y.o. female who presents for evaluation of URI symptoms for 3 days. Patient reports associated symptoms of fever of 102, body aches, cough, congestion, nausea and vomiting. Denies diarrhea, sore throat, shortness of breath. Patient does not have a hx of asthma.  She is an active smoker.   No known sick contacts and no recent travel. Pt is not vaccinated for COVID. Pt is not vaccinated for flu this season. Pt has taken TheraFlu, Alka-Seltzer, Delsym OTC for symptoms. Pt has no other concerns at this time.    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   Depression 05/14/2015   Dialysis care    ESRD (end stage renal disease) on dialysis [facility])    GERD (gastroesophageal reflux disease) 01/07/2017   HTN (hypertension) 05/02/2011   Murmur, cardiac 07/17/2022   Spina bifida    UTI (lower urinary tract infection)     Patient Active Problem List   Diagnosis Date Noted   Murmur, cardiac 07/17/2022   Abnormal echocardiogram 07/17/2022   Hypoglycemia without diagnosis of diabetes mellitus 05/13/2022   Sepsis ([place]) 05/08/2020   Dialysis AV fistula malfunction ([place])    UTI (urinary tract infection) 04/29/2020   Acute UTI 05/07/2018   Tobacco use 05/07/2018   Hypokalemia 05/07/2018   GERD (gastroesophageal reflux disease) 01/07/2017   Nausea with vomiting 01/07/2017   Pyelonephritis 07/31/2015   Back pain 07/31/2015   Abdominal pain 07/31/2015   Constipation 07/31/2015   Major depressive disorder, single  episode, severe without psychotic features ([place]) 05/15/2015   Spina bifida ([place]) 01/18/2012   ESRD (end stage renal disease) ([place]) 05/02/2011   Anemia 05/02/2011   HTN (hypertension) 05/02/2011    Past Surgical History:  Procedure Laterality Date   A/V FISTULAGRAM Left 10/14/2021   Procedure: A/V Fistulagram;  Surgeon: [name], MD;  Location: [place] [facility];  Service: Cardiovascular;  Laterality: Left;   AV FISTULA PLACEMENT     left arm   INSERTION OF DIALYSIS CATHETER N/A 05/08/2020   Procedure: ATTEMPTED, UNSUCCESSFUL INSERTION OF DIALYSIS CATHETER TUNNELED RIGHT INTERNAL JUGULAR;  Surgeon: Virl Cagey, MD;  Location: [facility];  Service: General;  Laterality: N/A;   IR FLUORO GUIDE CV LINE RIGHT  05/09/2020   IR US GUIDE VASC ACCESS RIGHT  05/09/2020    OB History     Gravida  0   Para  0   Term  0   Preterm  0   AB  0   Living  0      SAB  0   IAB  0   Ectopic  0   Multiple  0   Live Births  0            Home Medications    Prior to Admission medications   Medication Sig Start Date End Date Taking? Authorizing Provider  albuterol (VENTOLIN HFA) 108 (90 Base) MCG/ACT inhaler Inhale 1-2 puffs  into the lungs every 6 (six) hours as needed for wheezing or shortness of breath. 09/01/22  Yes Melynda Ripple, NP  acetaminophen (TYLENOL) 500 MG tablet Take 1,000 mg by mouth every 6 (six) hours as needed for mild pain or headache.    [provider]  AURYXIA 1 GM 210 MG(Fe) tablet Take 630 mg by mouth 3 (three) times daily with meals. 04/15/18   [provider]  B Complex-C-Folic Acid (DIALYVITE TABLET) TABS Take 1 tablet by mouth daily.    [provider]  diphenhydrAMINE (BENADRYL) 25 mg capsule Take 25 mg by mouth daily as needed for allergies.    [provider]  Doxercalciferol (HECTOROL IV) Given at dialysis 01/07/22 01/06/23  [provider]  Epoetin Alfa (EPOGEN IJ) Inject as directed See  admin instructions. Every Tuesday, Thursday, Saturday    [provider]  hydrALAZINE (APRESOLINE) 25 MG tablet Take 25 mg by mouth 3 (three) times daily. 04/28/20   [provider]  Methoxy PEG-Epoetin Beta (MIRCERA IJ) Mircera 04/17/22 04/16/23  [provider]  metoprolol succinate (TOPROL-XL) 100 MG 24 hr tablet Take 100 mg by mouth daily. 12/29/19   [provider]  omeprazole (PRILOSEC) 40 MG capsule Take 40 mg by mouth every morning. 01/08/22   [provider]  ondansetron (ZOFRAN) 4 MG tablet Take 1 tablet (4 mg total) by mouth every 6 (six) hours as needed for nausea or vomiting. 04/04/21   Marcello Fennel, PA-C  ondansetron (ZOFRAN-ODT) 4 MG disintegrating tablet Take 1 tablet (4 mg total) by mouth every 8 (eight) hours as needed. 06/13/22   Long, Wonda Olds, MD  sodium zirconium cyclosilicate (LOKELMA) 5 g packet Take 10 g by mouth every Monday, Wednesday, and Friday. 07/20/21   [provider]    Family History Family History  Problem Relation Age of Onset   Kidney cancer Other    Hypertension Maternal Grandmother    Arthritis Maternal Grandmother    Breast cancer Maternal Aunt    Colon cancer Neg Hx     Social History Social History   Tobacco Use   Smoking status: Former    Packs/day: 0.10    Types: Cigarettes   Smokeless tobacco: Never   Tobacco comments:    2 cigs a day  Substance Use Topics   Alcohol use: No   Drug use: Yes    Types: Marijuana     Allergies   Ciprofloxacin; Other; Peanut-containing drug products; Aleve [naproxen sodium]; Ceftriaxone; Coconut (cocos nucifera); Influenza vaccines; Tetanus toxoid, adsorbed; Tetanus toxoids; and Latex   Review of Systems Review of Systems  Constitutional:  Positive for fever.  HENT:  Positive for congestion.   Respiratory:  Positive for cough.   Gastrointestinal:  Positive for nausea and vomiting.  Musculoskeletal:  Positive for myalgias.     Physical  Exam Triage Vital Signs ED Triage Vitals  Enc Vitals Group     BP 09/01/22 1552 (!) 159/96     Pulse Rate 09/01/22 1552 84     Resp 09/01/22 1552 18     Temp 09/01/22 1552 (!) 101.8 F (38.8 C)     Temp Source 09/01/22 1552 Oral     SpO2 09/01/22 1552 96 %     Weight --      Height --      Head Circumference --      Peak Flow --      Pain Score 09/01/22 1557 9     Pain  Loc --      Pain Edu? --      Excl. in [place]? --    No data found.  Updated Vital Signs BP (!) 159/96 (BP Location: Right Arm)   Pulse 84   Temp (!) 101.8 F (38.8 C) (Oral) Comment: provider Made aware  Resp 18   SpO2 96%   Visual Acuity Right Eye Distance:   Left Eye Distance:   Bilateral Distance:    Right Eye Near:   Left Eye Near:    Bilateral Near:     Physical Exam Vitals and nursing note reviewed.  Constitutional:      General: She is not in acute distress.    Appearance: She is well-developed. She is not ill-appearing.  HENT:     Head: Normocephalic and atraumatic.     Right Ear: Tympanic membrane and ear canal normal.     Left Ear: Tympanic membrane and ear canal normal.     Nose: Congestion present.     Mouth/Throat:     Mouth: Mucous membranes are moist.     Pharynx: Oropharynx is clear. Uvula midline. Posterior oropharyngeal erythema present.     Tonsils: No tonsillar exudate or tonsillar abscesses.  Eyes:     Conjunctiva/sclera: Conjunctivae normal.     Pupils: Pupils are equal, round, and reactive to light.  Cardiovascular:     Rate and Rhythm: Normal rate and regular rhythm.     Heart sounds: Normal heart sounds.  Pulmonary:     Effort: Pulmonary effort is normal.     Breath sounds: Normal breath sounds.  Musculoskeletal:     Cervical back: Normal range of motion and neck supple.  Lymphadenopathy:     Cervical: No cervical adenopathy.  Skin:    General: Skin is warm and dry.  Neurological:     General: No focal deficit present.     Mental Status: She is alert and  oriented to person, place, and time.  Psychiatric:        Mood and Affect: Mood normal.        Behavior: Behavior normal.      UC Treatments / Results  Labs (all labs ordered are listed, but only abnormal results are displayed) Labs Reviewed  SARS CORONAVIRUS 2 (TAT 6-24 HRS)    EKG   Radiology No results found.  Procedures Procedures (including critical care time)  Medications Ordered in UC Medications  acetaminophen (TYLENOL) tablet 650 mg (650 mg Oral Given 09/01/22 1616)    Initial Impression / Assessment and Plan / UC Course  I have reviewed the triage vital signs and the nursing notes.  Pertinent labs & imaging results that were available during my care of the patient were reviewed by me and considered in my medical decision making (see chart for details).     Reviewed exam and symptoms with patient. Discussed symptoms consistent with influenza.  As it is day 3 outside the window for Tamiflu.   Patient given Tylenol in clinic for fever.  Patient has ESRD and is on hemodialysis.  No NSAIDs.  She has not dialysis tomorrow. Discussed hydration within the limits that has been suggested by her nephrologist given her end-stage renal disease. Albuterol inhaler as needed.  Patient states she has had this when she gets sick as she always gets bronchitis. Rest Advised PCP follow-up in 1 to 2 days for recheck Strict ER precautions reviewed and patient verbalized understanding Final Clinical Impressions(s) / UC Diagnoses   Final diagnoses:  Fever, unspecified  Flu-like symptoms     Discharge Instructions      Your symptoms appear consistent with flulike illness.  Tylenol as needed for fever management Rest and fluids Please follow-up with your PCP in 1 to 2 days for recheck Please go to the emergency room ASAP if you have any worsening symptoms.  This includes was not limited to inability to control your fever, inability to stay hydrated, or any new concerns that  arise    ED Prescriptions     Medication Sig Dispense Auth. Provider   albuterol (VENTOLIN HFA) 108 (90 Base) MCG/ACT inhaler Inhale 1-2 puffs into the lungs every 6 (six) hours as needed for wheezing or shortness of breath. 1 each Melynda Ripple, NP      PDMP not reviewed this encounter.   Melynda Ripple, NP 09/01/22 [phone]

## 2022-09-01 NOTE — ED Triage Notes (Signed)
Patient c/o cough, congestion, nausea, vomiting, diarrhea, and chills x 3 days.   Home interventions: alka seltzer, Theraflu

## 2022-09-02 DIAGNOSIS — D631 Anemia in chronic kidney disease: Secondary | ICD-10-CM | POA: Diagnosis not present

## 2022-09-02 DIAGNOSIS — N2581 Secondary hyperparathyroidism of renal origin: Secondary | ICD-10-CM | POA: Diagnosis not present

## 2022-09-02 DIAGNOSIS — Z992 Dependence on renal dialysis: Secondary | ICD-10-CM | POA: Diagnosis not present

## 2022-09-02 DIAGNOSIS — N186 End stage renal disease: Secondary | ICD-10-CM | POA: Diagnosis not present

## 2022-09-02 LAB — SARS CORONAVIRUS 2 (TAT 6-24 HRS): SARS Coronavirus 2: NEGATIVE

## 2022-09-04 ENCOUNTER — Emergency Department (HOSPITAL_COMMUNITY)
Admission: EM | Admit: 2022-09-04 | Discharge: 2022-09-04 | Disposition: A | Discharge status: Home / Self Care | Payer: Medicare Other | Attending: Emergency Medicine | Admitting: Emergency Medicine

## 2022-09-04 ENCOUNTER — Encounter (HOSPITAL_COMMUNITY): Payer: Self-pay | Admitting: Emergency Medicine

## 2022-09-04 ENCOUNTER — Other Ambulatory Visit: Payer: Self-pay

## 2022-09-04 ENCOUNTER — Emergency Department (HOSPITAL_COMMUNITY): Payer: Medicare Other

## 2022-09-04 DIAGNOSIS — Z91040 Latex allergy status: Secondary | ICD-10-CM | POA: Insufficient documentation

## 2022-09-04 DIAGNOSIS — R059 Cough, unspecified: Secondary | ICD-10-CM | POA: Diagnosis not present

## 2022-09-04 DIAGNOSIS — K529 Noninfective gastroenteritis and colitis, unspecified: Secondary | ICD-10-CM | POA: Diagnosis not present

## 2022-09-04 DIAGNOSIS — D631 Anemia in chronic kidney disease: Secondary | ICD-10-CM | POA: Diagnosis not present

## 2022-09-04 DIAGNOSIS — Z1152 Encounter for screening for COVID-19: Secondary | ICD-10-CM | POA: Insufficient documentation

## 2022-09-04 DIAGNOSIS — R0602 Shortness of breath: Secondary | ICD-10-CM | POA: Insufficient documentation

## 2022-09-04 DIAGNOSIS — R058 Other specified cough: Secondary | ICD-10-CM | POA: Diagnosis not present

## 2022-09-04 DIAGNOSIS — K5282 Eosinophilic colitis: Secondary | ICD-10-CM | POA: Diagnosis not present

## 2022-09-04 DIAGNOSIS — R062 Wheezing: Secondary | ICD-10-CM | POA: Diagnosis not present

## 2022-09-04 DIAGNOSIS — Z992 Dependence on renal dialysis: Secondary | ICD-10-CM | POA: Diagnosis not present

## 2022-09-04 DIAGNOSIS — R109 Unspecified abdominal pain: Secondary | ICD-10-CM | POA: Diagnosis present

## 2022-09-04 DIAGNOSIS — Z91010 Allergy to peanuts: Secondary | ICD-10-CM | POA: Diagnosis not present

## 2022-09-04 DIAGNOSIS — R509 Fever, unspecified: Secondary | ICD-10-CM | POA: Diagnosis not present

## 2022-09-04 DIAGNOSIS — N186 End stage renal disease: Secondary | ICD-10-CM | POA: Diagnosis not present

## 2022-09-04 DIAGNOSIS — N2581 Secondary hyperparathyroidism of renal origin: Secondary | ICD-10-CM | POA: Diagnosis not present

## 2022-09-04 DIAGNOSIS — R197 Diarrhea, unspecified: Secondary | ICD-10-CM | POA: Diagnosis not present

## 2022-09-04 DIAGNOSIS — N323 Diverticulum of bladder: Secondary | ICD-10-CM | POA: Diagnosis not present

## 2022-09-04 LAB — COMPREHENSIVE METABOLIC PANEL
ALT: 70 U/L — ABNORMAL HIGH (ref 0–44)
AST: 75 U/L — ABNORMAL HIGH (ref 15–41)
Albumin: 3.8 g/dL (ref 3.5–5.0)
Alkaline Phosphatase: 127 U/L — ABNORMAL HIGH (ref 38–126)
Anion gap: 23 — ABNORMAL HIGH (ref 5–15)
BUN: 54 mg/dL — ABNORMAL HIGH (ref 6–20)
CO2: 18 mmol/L — ABNORMAL LOW (ref 22–32)
Calcium: 9 mg/dL (ref 8.9–10.3)
Chloride: 94 mmol/L — ABNORMAL LOW (ref 98–111)
Creatinine, Ser: 8.45 mg/dL — ABNORMAL HIGH (ref 0.44–1.00)
GFR, Estimated: 6 mL/min — ABNORMAL LOW (ref >60)
Glucose, Bld: 67 mg/dL — ABNORMAL LOW (ref 70–99)
Potassium: 4.5 mmol/L (ref 3.5–5.1)
Sodium: 135 mmol/L (ref 135–145)
Total Bilirubin: 0.8 mg/dL (ref 0.3–1.2)
Total Protein: 7.6 g/dL (ref 6.5–8.1)

## 2022-09-04 LAB — CBC WITH DIFFERENTIAL/PLATELET
Abs Immature Granulocytes: 0.04 10*3/uL (ref 0.00–0.07)
Basophils Absolute: 0 10*3/uL (ref 0.0–0.1)
Basophils Relative: 0 %
Eosinophils Absolute: 0.1 10*3/uL (ref 0.0–0.5)
Eosinophils Relative: 3 %
HCT: 39.2 % (ref 36.0–46.0)
Hemoglobin: 11.9 g/dL — ABNORMAL LOW (ref 12.0–15.0)
Immature Granulocytes: 1 %
Lymphocytes Relative: 27 %
Lymphs Abs: 1.2 10*3/uL (ref 0.7–4.0)
MCH: 31.6 pg (ref 26.0–34.0)
MCHC: 30.4 g/dL (ref 30.0–36.0)
MCV: 104 fL — ABNORMAL HIGH (ref 80.0–100.0)
Monocytes Absolute: 0.6 10*3/uL (ref 0.1–1.0)
Monocytes Relative: 13 %
Neutro Abs: 2.5 10*3/uL (ref 1.7–7.7)
Neutrophils Relative %: 56 %
Platelets: 107 10*3/uL — ABNORMAL LOW (ref 150–400)
RBC: 3.77 MIL/uL — ABNORMAL LOW (ref 3.87–5.11)
RDW: 15.9 % — ABNORMAL HIGH (ref 11.5–15.5)
WBC: 4.5 10*3/uL (ref 4.0–10.5)
nRBC: 2 % — ABNORMAL HIGH (ref 0.0–0.2)

## 2022-09-04 LAB — RESP PANEL BY RT-PCR (RSV, FLU A&B, COVID)  RVPGX2
Influenza A by PCR: NEGATIVE
Influenza B by PCR: NEGATIVE
Resp Syncytial Virus by PCR: NEGATIVE
SARS Coronavirus 2 by RT PCR: NEGATIVE

## 2022-09-04 MED ORDER — OXYCODONE-ACETAMINOPHEN 5-325 MG PO TABS
0.5000 | ORAL_TABLET | Freq: Once | ORAL | Status: AC
  Administered 2022-09-04: 0.5 via ORAL
  Filled 2022-09-04: qty 1

## 2022-09-04 MED ORDER — LACTATED RINGERS IV BOLUS
500.0000 mL | Freq: Once | INTRAVENOUS | Status: AC
  Administered 2022-09-04: 500 mL via INTRAVENOUS

## 2022-09-04 MED ORDER — PROMETHAZINE HCL 12.5 MG PO TABS: 12.5000 mg | ORAL_TABLET | Freq: Once | ORAL | Status: DC

## 2022-09-04 MED ORDER — DIPHENOXYLATE-ATROPINE 2.5-0.025 MG PO TABS
2.0000 | ORAL_TABLET | Freq: Once | ORAL | Status: AC
  Administered 2022-09-04: 2 via ORAL

## 2022-09-04 MED ORDER — IPRATROPIUM-ALBUTEROL 0.5-2.5 (3) MG/3ML IN SOLN
3.0000 mL | Freq: Once | RESPIRATORY_TRACT | Status: AC
  Administered 2022-09-04: 3 mL via RESPIRATORY_TRACT
  Filled 2022-09-04: qty 3

## 2022-09-04 MED ORDER — ONDANSETRON HCL 4 MG/2ML IJ SOLN
4.0000 mg | Freq: Once | INTRAMUSCULAR | Status: AC
  Administered 2022-09-04: 4 mg via INTRAVENOUS
  Filled 2022-09-04: qty 2

## 2022-09-04 MED ORDER — AMOXICILLIN-POT CLAVULANATE 875-125 MG PO TABS: 1.0000 | ORAL_TABLET | Freq: Two times a day (BID) | ORAL | 0 refills | Status: DC

## 2022-09-04 NOTE — ED Triage Notes (Signed)
C/o diarrhea (with blood in stool), chills, weakness, vomiting, fever (101.8) that started about 5xdays ago. Pt taking tylenol and otc medication for symptoms. Day before yesterday, pt was at [facility] and was tested for covid. Tested negative. Dialysis t/th/sat.

## 2022-09-04 NOTE — ED Notes (Signed)
Pt states phernergan makes her sick. States "I have some zofran at home and I'll take that"

## 2022-09-04 NOTE — ED Provider Notes (Signed)
[place] Provider Note   CSN: [id number] Arrival date & time: 09/04/22  0231     History  No chief complaint on file.   [name] is a 32 y.o. female.  Multiple symptoms to include nausea, vomiting, diarrhea, abdominal cramping, subjective fever, shortness of breath. Hasn't tried anything at home for her symptoms. No measured temperatures. Hasn't missed dialysis. No trauma. No other changes. No sick contacts. No suspicious food intake. Hasn't tried anything for her symptoms.         Home Medications Prior to Admission medications   Medication Sig Start Date End Date Taking? Authorizing Provider  amoxicillin-clavulanate (AUGMENTIN) 875-125 MG tablet Take 1 tablet by mouth every 12 (twelve) hours. 09/04/22  Yes Ginni Eichler, Corene Cornea, MD  acetaminophen (TYLENOL) 500 MG tablet Take 1,000 mg by mouth every 6 (six) hours as needed for mild pain or headache.    [provider]  albuterol (VENTOLIN HFA) 108 (90 Base) MCG/ACT inhaler Inhale 1-2 puffs into the lungs every 6 (six) hours as needed for wheezing or shortness of breath. 09/01/22   Melynda Ripple, NP  AURYXIA 1 GM 210 MG(Fe) tablet Take 630 mg by mouth 3 (three) times daily with meals. 04/15/18   [provider]  B Complex-C-Folic Acid (DIALYVITE TABLET) TABS Take 1 tablet by mouth daily.    [provider]  diphenhydrAMINE (BENADRYL) 25 mg capsule Take 25 mg by mouth daily as needed for allergies.    [provider]  Doxercalciferol (HECTOROL IV) Given at dialysis 01/07/22 01/06/23  [provider]  Epoetin Alfa (EPOGEN IJ) Inject as directed See admin instructions. Every Tuesday, Thursday, Saturday    [provider]  hydrALAZINE (APRESOLINE) 25 MG tablet Take 25 mg by mouth 3 (three) times daily. 04/28/20   [provider]  Methoxy PEG-Epoetin Beta (MIRCERA IJ) Mircera 04/17/22 04/16/23  [provider]  metoprolol  succinate (TOPROL-XL) 100 MG 24 hr tablet Take 100 mg by mouth daily. 12/29/19   [provider]  omeprazole (PRILOSEC) 40 MG capsule Take 40 mg by mouth every morning. 01/08/22   [provider]  ondansetron (ZOFRAN) 4 MG tablet Take 1 tablet (4 mg total) by mouth every 6 (six) hours as needed for nausea or vomiting. 04/04/21   Marcello Fennel, PA-C  ondansetron (ZOFRAN-ODT) 4 MG disintegrating tablet Take 1 tablet (4 mg total) by mouth every 8 (eight) hours as needed. 06/13/22   Long, Wonda Olds, MD  sodium zirconium cyclosilicate (LOKELMA) 5 g packet Take 10 g by mouth every Monday, Wednesday, and Friday. 07/20/21   [provider]      Allergies    Ciprofloxacin; Other; Peanut-containing drug products; Aleve [naproxen sodium]; Ceftriaxone; Coconut (cocos nucifera); Influenza vaccines; Tetanus toxoid, adsorbed; Tetanus toxoids; and Latex    Review of Systems   Review of Systems  Physical Exam Updated Vital Signs BP (!) 175/110   Pulse (!) 54   Temp 97.6 F (36.4 C) (Oral)   Resp 11   Ht 2' (0.61 m)   Wt 32.2 kg   SpO2 99%   BMI 86.66 kg/m  Physical Exam Vitals and nursing note reviewed.  Constitutional:      Appearance: She is well-developed.  HENT:     Head: Normocephalic and atraumatic.     Mouth/Throat:     Mouth: Mucous membranes are moist.     Pharynx: Oropharynx is clear.  Eyes:     Pupils: Pupils  are equal, round, and reactive to light.  Cardiovascular:     Rate and Rhythm: Normal rate and regular rhythm.  Pulmonary:     Effort: No respiratory distress.     Breath sounds: No stridor.  Abdominal:     General: There is no distension.  Musculoskeletal:        General: Deformity (chronic) present.     Cervical back: Normal range of motion.  Skin:    General: Skin is warm and dry.     Coloration: Skin is not jaundiced.  Neurological:     General: No focal deficit present.     Mental Status: She is alert.     ED Results / Procedures /  Treatments   Labs (all labs ordered are listed, but only abnormal results are displayed) Labs Reviewed  CBC WITH DIFFERENTIAL/PLATELET - Abnormal; Notable for the following components:      Result Value   RBC 3.77 (*)    Hemoglobin 11.9 (*)    MCV 104.0 (*)    RDW 15.9 (*)    Platelets 107 (*)    nRBC 2.0 (*)    All other components within normal limits  COMPREHENSIVE METABOLIC PANEL - Abnormal; Notable for the following components:   Chloride 94 (*)    CO2 18 (*)    Glucose, Bld 67 (*)    BUN 54 (*)    Creatinine, Ser 8.45 (*)    AST 75 (*)    ALT 70 (*)    Alkaline Phosphatase 127 (*)    GFR, Estimated 6 (*)    Anion gap 23 (*)    All other components within normal limits  RESP PANEL BY RT-PCR (RSV, FLU A&B, COVID)  [id number]    EKG None  Radiology CT ABDOMEN PELVIS WO CONTRAST  Result Date: 09/04/2022 CLINICAL DATA:  Abdominal pain with blood in the stools, chills, weakness, vomiting and fever onset 5 days ago. 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 EXAM: CT ABDOMEN AND PELVIS WITHOUT CONTRAST TECHNIQUE: Multidetector CT imaging of the abdomen and pelvis was performed following the standard protocol without IV contrast. RADIATION DOSE REDUCTION: This exam was performed according to the departmental dose-optimization program which includes automated exposure control, adjustment of the mA and/or kV according to patient size and/or use of iterative reconstruction technique. COMPARISON:  CT without contrast 04/28/2022, 02/08/2022 FINDINGS: Lower chest: There is mosaic attenuation in the lower lobes with bronchial thickening in both lower lobes. Findings consistent with air trapping and small airways disease. No focal pneumonia is evident. There is mild cardiomegaly and a small pericardial effusion. Calcifications are again noted in the mitral ring and tricuspid valve. Hepatobiliary: No focal liver  abnormality is seen without contrast. The gallbladder is not well seen but unremarkable where visible. No biliary dilatation is seen. Pancreas: Poorly visualized.  No obvious mass. Spleen: No abnormality is seen without contrast. Adrenals/Urinary Tract: No adrenal mass is evident. 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 The right ureter is not seen. There is chronic dilatation in the left ureter. There is chronic moderate to severe trabeculated thickening throughout the bladder with calcifications along its wall and moderate bladder distention, findings which could be due to chronic neurogenic bladder dysfunction and/or recurring/relapsing bladder infections. There are scattered bladder diverticula. Stomach/Bowel: Chronic thickened folds noted in the stomach. The small bowel is normal caliber. An appendix is not seen in this patient. There is wall prominence versus  underdistention in the large bowel. Correlate clinically for proctocolitis. Vascular/Lymphatic: No significant vascular findings or adenopathy are seen. Evaluation limited due to a paucity of body fat, ascites, lack of IV contrast, and unopacified bowel. Reproductive: Left ovarian dermoid measures slightly larger than previously, today's measurements are 8.5 x 8.2 x 9.7 cm, previously 7.9 x 7.9 by 9.7 cm. Other: There is mild free ascites, new in the interval as well as increased body wall anasarca. There is no free air or incarcerated hernia. Musculoskeletal: There is caudal regression syndrome with absent sacrum and lower lumbar spine and hypoplastic deformed pelvis with medialization of the hips. 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 There is complete fatty atrophy in the visualized thigh musculature, gluteal musculature and dorsal paraspinal musculature. IMPRESSION: 1. Lower lobe air trapping and small airways disease. 2. Cardiomegaly with small pericardial effusion.  3. Wall thickening versus underdistention in the large bowel. Correlate clinically for proctocolitis. 4. Chronic thickened folds in the stomach. 5. 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 6. Chronic moderate to severe trabeculated thickening throughout the bladder with calcifications along its wall and moderate bladder distention, findings which could be due to chronic neurogenic bladder and/or recurring/relapsing bladder infections. 7. Mild free ascites and increased body wall anasarca since the last CT. 8. Slight interval enlargement of left ovarian dermoid. 9. 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 Electronically Signed   By: Telford Nab M.D.   On: 09/04/2022 06:17   DG Chest Portable 1 View  Result Date: 09/04/2022 CLINICAL DATA:  32 year old female with wheezing, cough, diarrhea, fever. Dialysis patient. Spina bifida. EXAM: PORTABLE CHEST 1 VIEW COMPARISON:  Chest radiographs 06/03/2022 and earlier. FINDINGS: Portable AP semi upright view at 0350 hours. Stable lung volumes and mediastinal contours, diaphragm contour from last year. Mild cardiomegaly redemonstrated. Nipple shadow suspected in the right mid lung. Allowing for portable technique the lungs are clear. Visualized tracheal air column is within normal limits. Stable visualized osseous structures. Paucity of bowel gas in the visible abdomen. IMPRESSION: No acute cardiopulmonary abnormality. Electronically Signed   By: Genevie Ann M.D.   On: 09/04/2022 04:04    Procedures Procedures    Medications Ordered in ED Medications  ondansetron (ZOFRAN) injection 4 mg (4 mg Intravenous Given 09/04/22 0342)  diphenoxylate-atropine (LOMOTIL) 2.5-0.025 MG per tablet 2 tablet (2 tablets Oral Given 09/04/22 0347)   ipratropium-albuterol (DUONEB) 0.5-2.5 (3) MG/3ML nebulizer solution 3 mL (3 mLs Nebulization Given 09/04/22 0410)  oxyCODONE-acetaminophen (PERCOCET/ROXICET) 5-325 MG per tablet 0.5 tablet (0.5 tablets Oral Given 09/04/22 0347)  lactated ringers bolus 500 mL (0 mLs Intravenous Stopped 09/04/22 0558)    ED Course/ Medical Decision Making/ A&P                             Medical Decision Making Amount and/or Complexity of Data Reviewed Labs: ordered. Radiology: ordered.  Risk Prescription drug management.   Here with mainly diarrhea that sometimes has blood in it. VS reassuring aside from hypertension. Exam reassuring. Relaxed and playing games on the phone during most of the visit. CT scan viewed by myself without obvious acute abnormalities however radiology notes proctocolitis which would likely be related to her symptoms. Will start abx, ask  for pcp/gi follow up.   Of note, patient vomited again right before discharge. Ordered phenergan and had planned to reeval prior to leaving however it appears she has left. Will go to dialysis later today.   Final Clinical Impression(s) / ED Diagnoses Final diagnoses:  Proctocolitis    Rx / DC Orders ED Discharge Orders          Ordered    amoxicillin-clavulanate (AUGMENTIN) 875-125 MG tablet  Every 12 hours        09/04/22 0627              Taesha Goodell, Corene Cornea, MD 09/04/22 2302

## 2022-09-04 NOTE — ED Notes (Signed)
Pt cannot sign for discharge as signature pad is not working at this time. Discharge instructions gone over with pt.

## 2022-09-04 NOTE — ED Notes (Signed)
RT at bedside.

## 2022-09-04 NOTE — ED Notes (Signed)
Patient transported to CT 

## 2022-09-04 NOTE — ED Notes (Signed)
RT called regarding pt's duoneb

## 2022-09-04 NOTE — ED Notes (Signed)
ED Provider at bedside. 

## 2022-09-04 NOTE — ED Notes (Signed)
Lab at bedside

## 2022-09-05 NOTE — Progress Notes (Signed)
  Care Coordination Note  09/05/2022 Name: [name] MRN: [id number] DOB: [birth year]  [name] is a 32 y.o. year old female who is a primary care patient of Rubie Maid, FNP and is actively engaged with the care management team. I reached out to [name] by phone today to assist with scheduling a follow up visit with the RN Case Manager  Follow up plan: Telephone appointment with care management team member scheduled for:09/16/22  [place]: [phone]

## 2022-09-06 DIAGNOSIS — N2581 Secondary hyperparathyroidism of renal origin: Secondary | ICD-10-CM | POA: Diagnosis not present

## 2022-09-06 DIAGNOSIS — N186 End stage renal disease: Secondary | ICD-10-CM | POA: Diagnosis not present

## 2022-09-06 DIAGNOSIS — Z992 Dependence on renal dialysis: Secondary | ICD-10-CM | POA: Diagnosis not present

## 2022-09-06 DIAGNOSIS — D631 Anemia in chronic kidney disease: Secondary | ICD-10-CM | POA: Diagnosis not present

## 2022-09-09 ENCOUNTER — Ambulatory Visit: Payer: Medicare Other | Admitting: Family Medicine

## 2022-09-09 DIAGNOSIS — N186 End stage renal disease: Secondary | ICD-10-CM | POA: Diagnosis not present

## 2022-09-09 DIAGNOSIS — Z992 Dependence on renal dialysis: Secondary | ICD-10-CM | POA: Diagnosis not present

## 2022-09-09 DIAGNOSIS — N2581 Secondary hyperparathyroidism of renal origin: Secondary | ICD-10-CM | POA: Diagnosis not present

## 2022-09-09 DIAGNOSIS — D631 Anemia in chronic kidney disease: Secondary | ICD-10-CM | POA: Diagnosis not present

## 2022-09-12 DIAGNOSIS — Z992 Dependence on renal dialysis: Secondary | ICD-10-CM | POA: Diagnosis not present

## 2022-09-12 DIAGNOSIS — N2581 Secondary hyperparathyroidism of renal origin: Secondary | ICD-10-CM | POA: Diagnosis not present

## 2022-09-12 DIAGNOSIS — N186 End stage renal disease: Secondary | ICD-10-CM | POA: Diagnosis not present

## 2022-09-12 DIAGNOSIS — D631 Anemia in chronic kidney disease: Secondary | ICD-10-CM | POA: Diagnosis not present

## 2022-09-13 DIAGNOSIS — N186 End stage renal disease: Secondary | ICD-10-CM | POA: Diagnosis not present

## 2022-09-13 DIAGNOSIS — D631 Anemia in chronic kidney disease: Secondary | ICD-10-CM | POA: Diagnosis not present

## 2022-09-13 DIAGNOSIS — N2581 Secondary hyperparathyroidism of renal origin: Secondary | ICD-10-CM | POA: Diagnosis not present

## 2022-09-13 DIAGNOSIS — Z992 Dependence on renal dialysis: Secondary | ICD-10-CM | POA: Diagnosis not present

## 2022-09-15 ENCOUNTER — Encounter: Payer: Self-pay | Admitting: Family Medicine

## 2022-09-15 ENCOUNTER — Ambulatory Visit (INDEPENDENT_AMBULATORY_CARE_PROVIDER_SITE_OTHER): Payer: Medicare Other | Admitting: Family Medicine

## 2022-09-15 VITALS — BP 132/100 | HR 95 | Temp 100.3°F | Ht <= 58 in | Wt <= 1120 oz

## 2022-09-15 DIAGNOSIS — J069 Acute upper respiratory infection, unspecified: Secondary | ICD-10-CM | POA: Diagnosis not present

## 2022-09-15 HISTORY — DX: Acute upper respiratory infection, unspecified: J06.9

## 2022-09-15 MED ORDER — ALBUTEROL SULFATE HFA 108 (90 BASE) MCG/ACT IN AERS: 1.0000 | INHALATION_SPRAY | Freq: Four times a day (QID) | RESPIRATORY_TRACT | 0 refills | Status: AC | PRN

## 2022-09-15 NOTE — Assessment & Plan Note (Signed)
Reassured patient that symptoms and exam findings are most consistent with a viral upper respiratory infection and explained lack of efficacy of antibiotics against viruses.  Discussed expected course and features suggestive of secondary bacterial infection.  Continue supportive care. Increase fluid intake with water or electrolyte solution like pedialyte. Encouraged acetaminophen as needed for fever/pain. Encouraged salt water gargling, chloraseptic spray and throat lozenges. Encouraged OTC guaifenesin. Encouraged saline sinus flushes and/or neti with humidified air.   

## 2022-09-15 NOTE — Progress Notes (Addendum)
Acute Office Visit  Subjective:     Patient ID: [name], female    DOB: 01/23/1991, 32 y.o.   MRN: [id number]  Chief Complaint  Patient presents with   Follow-up    needs a letter to say she can handle her own affairs/slj      HPI Patient is in today requesting a letter saying she is capable of handling her own money. Currently her cousin manages her finances and is a Manufacturing systems engineer. Ms Avetisyan has no mental incapacitation or deficits that should prohibit her from managing her finances. She reports handling her own finances currently but had previously required a payee to open a checking account for unknown reasons.  She is also complaining of 2 days of cough, nausea, and diarrhea. Has been around her partner who is also ill. Was sick 2 weeks ago and treated with antibiotics, symptoms resolved, and have now returned. Denies fever, shortness of breath, chest pain, congestion.  Review of Systems  All other systems reviewed and are negative.  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   Depression 05/14/2015   Dialysis care    ESRD (end stage renal disease) on dialysis [facility])    GERD (gastroesophageal reflux disease) 01/07/2017   HTN (hypertension) 05/02/2011   Murmur, cardiac 07/17/2022   Spina bifida    UTI (lower urinary tract infection)    Viral URI with cough 09/15/2022   Past Surgical History:  Procedure Laterality Date   A/V FISTULAGRAM Left 10/14/2021   Procedure: A/V Fistulagram;  Surgeon: [name], MD;  Location: [place] [facility];  Service: Cardiovascular;  Laterality: Left;   AV FISTULA PLACEMENT     left arm   INSERTION OF DIALYSIS CATHETER N/A 05/08/2020   Procedure: ATTEMPTED, UNSUCCESSFUL INSERTION OF DIALYSIS CATHETER TUNNELED RIGHT INTERNAL JUGULAR;  Surgeon: Virl Cagey, MD;  Location: [facility];  Service:  General;  Laterality: N/A;   IR FLUORO GUIDE CV LINE RIGHT  05/09/2020   IR US GUIDE VASC ACCESS RIGHT  05/09/2020   Current Outpatient Medications on File Prior to Visit  Medication Sig Dispense Refill   acetaminophen (TYLENOL) 500 MG tablet Take 1,000 mg by mouth every 6 (six) hours as needed for mild pain or headache.     amoxicillin-clavulanate (AUGMENTIN) 875-125 MG tablet Take 1 tablet by mouth every 12 (twelve) hours. 14 tablet 0   AURYXIA 1 GM 210 MG(Fe) tablet Take 630 mg by mouth 3 (three) times daily with meals.  3   B Complex-C-Folic Acid (DIALYVITE TABLET) TABS Take 1 tablet by mouth daily.     diphenhydrAMINE (BENADRYL) 25 mg capsule Take 25 mg by mouth daily as needed for allergies.     Doxercalciferol (HECTOROL IV) Given at dialysis     Epoetin Alfa (EPOGEN IJ) Inject as directed See admin instructions. Every Tuesday, Thursday, Saturday     hydrALAZINE (APRESOLINE) 25 MG tablet Take 25 mg by mouth 3 (three) times daily.     Methoxy PEG-Epoetin Beta (MIRCERA IJ) Mircera     metoprolol succinate (TOPROL-XL) 100 MG 24 hr tablet Take 100 mg by mouth daily.     omeprazole (PRILOSEC) 40 MG capsule Take 40 mg by mouth every morning.     ondansetron (ZOFRAN) 4 MG tablet Take 1 tablet (4 mg total) by mouth every 6 (six) hours as  needed for nausea or vomiting. 12 tablet 0   ondansetron (ZOFRAN-ODT) 4 MG disintegrating tablet Take 1 tablet (4 mg total) by mouth every 8 (eight) hours as needed. 20 tablet 0   sodium zirconium cyclosilicate (LOKELMA) 5 g packet Take 10 g by mouth every Monday, Wednesday, and Friday.     No current facility-administered medications on file prior to visit.   Allergies  Allergen Reactions   Ciprofloxacin Shortness Of Breath, Nausea And Vomiting and Other (See Comments)    HIGH FEVER and oral blisters    Other Anaphylaxis    Revaclear dialzer   Peanut-Containing Drug Products Anaphylaxis   Aleve [Naproxen Sodium] Other (See Comments)    G.I.Bleed    Ceftriaxone Other (See Comments)    Blisters in mouth    Coconut (Cocos Nucifera) Hives   Influenza Vaccines Nausea And Vomiting and Other (See Comments)    High fever   Tetanus Toxoid, Adsorbed Nausea And Vomiting and Other (See Comments)    HIGH FEVER, also   Tetanus Toxoids Nausea And Vomiting and Other (See Comments)    HIGH FEVER   Latex Itching and Rash        Objective:    BP (!) 132/100   Pulse 95   Temp 100.3 F (37.9 C) (Oral)   Ht 2' (0.61 m)   Wt 70 lb (31.8 kg)   SpO2 98%   BMI 85.44 kg/m    Physical Exam Vitals and nursing note reviewed.  Constitutional:      Appearance: Normal appearance. She is normal weight.  HENT:     Head: Normocephalic and atraumatic.  Cardiovascular:     Rate and Rhythm: Normal rate and regular rhythm.     Pulses: Normal pulses.     Heart sounds: Normal heart sounds.  Pulmonary:     Effort: Pulmonary effort is normal.     Breath sounds: Wheezing present.  Skin:    General: Skin is warm and dry.  Neurological:     General: No focal deficit present.     Mental Status: She is alert and oriented to person, place, and time. Mental status is at baseline.  Psychiatric:        Mood and Affect: Mood normal.        Behavior: Behavior normal.        Thought Content: Thought content normal.        Judgment: Judgment normal.     No results found for any visits on 09/15/22.      Assessment & Plan:   Problem List Items Addressed This Visit       Respiratory   Viral URI with cough - Primary    Reassured patient that symptoms and exam findings are most consistent with a viral upper respiratory infection and explained lack of efficacy of antibiotics against viruses.  Discussed expected course and features suggestive of secondary bacterial infection.  Continue supportive care. Increase fluid intake with water or electrolyte solution like pedialyte. Encouraged acetaminophen as needed for fever/pain. Encouraged salt water gargling,  chloraseptic spray and throat lozenges. Encouraged OTC guaifenesin. Encouraged saline sinus flushes and/or neti with humidified air.        Relevant Orders   SARS-CoV-2 RNA, Influenza A/B, and RSV RNA, Qualitative NAAT    Meds ordered this encounter  Medications   albuterol (VENTOLIN HFA) 108 (90 Base) MCG/ACT inhaler    Sig: Inhale 1-2 puffs into the lungs every 6 (six) hours as needed for wheezing or shortness of  breath.    Dispense:  1 each    Refill:  0    Order Specific Question:   Supervising Provider    Answer:   Jenna Luo T [id number]    Return if symptoms worsen or fail to improve.  Rubie Maid, FNP

## 2022-09-16 ENCOUNTER — Telehealth: Payer: Self-pay

## 2022-09-16 ENCOUNTER — Ambulatory Visit: Payer: Self-pay | Admitting: *Deleted

## 2022-09-16 ENCOUNTER — Other Ambulatory Visit: Payer: Self-pay | Admitting: Family Medicine

## 2022-09-16 DIAGNOSIS — Z992 Dependence on renal dialysis: Secondary | ICD-10-CM | POA: Diagnosis not present

## 2022-09-16 DIAGNOSIS — N2581 Secondary hyperparathyroidism of renal origin: Secondary | ICD-10-CM | POA: Diagnosis not present

## 2022-09-16 DIAGNOSIS — N186 End stage renal disease: Secondary | ICD-10-CM | POA: Diagnosis not present

## 2022-09-16 DIAGNOSIS — D631 Anemia in chronic kidney disease: Secondary | ICD-10-CM | POA: Diagnosis not present

## 2022-09-16 LAB — SARS-COV-2 RNA, INFLUENZA A/B, AND RSV RNA, QUALITATIVE NAAT
INFLUENZA A RNA: NOT DETECTED
INFLUENZA B RNA: NOT DETECTED
RSV RNA: NOT DETECTED
SARS COV2 RNA: DETECTED — AB

## 2022-09-16 MED ORDER — NIRMATRELVIR/RITONAVIR (PAXLOVID)TABLET: 3.0000 | ORAL_TABLET | Freq: Two times a day (BID) | ORAL | 0 refills | Status: AC

## 2022-09-16 NOTE — Telephone Encounter (Signed)
Kim with [facility] called in to let provider know that pt has tested positive for Covid. Pt was informed that if her results were positive for Covid provider would send in Paxlovid for pt. Please advise.  Cb#: [phone]  [place] ([place]), [place] - [address]  [address] [place], [place] ([place]) [place] [address]

## 2022-09-16 NOTE — Patient Outreach (Signed)
  Care Coordination   Initial Visit Note   09/16/2022 Name: [name] MRN: [id number] DOB: [birth year]  [name] is a 32 y.o. year old female who sees Rubie Maid, FNP for primary care. I  spoke with [name] at [phone] who reports [name] may not answer her home phone  What matters to the patients health and wellness today?  HIPAA guidelines discussed with [name]. He voiced understanding  Outreach to the home number to speak with [name] She gave permission for RN CM to speak with [name] as needed  Updated her demographics notes  She confirms Mr [name] was correct in stating she generally may not answer her phone as she is resting or sleeping most of the time  Most of time [name] shared [name] [name] nurse [name]   Goals Addressed               This Visit's Progress     Patient Stated     "I need a power W/C and a ramp" (THN) (pt-stated)        Care Coordination Interventions: Evaluation of current treatment plan related to impaired physical mobility and patient's adherence to plan as established by provider Determined patient is scheduled to f/u with [facility] PT for a face to face visit for evaluation of her power W/C on 08/07/22 Reviewed and discussed with patient ramp resource provided by [name], [name] for independent living [phone]  [name] in [place] outbound call to [name] for [name] [phone], left a voice message regarding patient's need for assistance with a W/C ramp, provided contact number for this RN requesting a return call          Manage Covid symptoms (THN) (pt-stated)   Not on track     Care Coordination Interventions: Provided education to patient to enhance basic understanding of COVID-19 as a viral disease, measures to prevent exposure, signs and symptoms, recommended vaccine schedule, when to contact  provider Counseled on importance of regular laboratory monitoring as prescribed Provided FDA vaccine guidelines Screening for signs and symptoms of depression;  Discussed effects, symptoms, and management of "long COVID"' Assessed social determinant of health barriers     Interventions Today    Flowsheet Row Most Recent Value  Chronic Disease   Chronic disease during today's visit Other, Chronic Kidney Disease/End Stage Renal Disease (ESRD)  General Interventions   General Interventions Discussed/Reviewed --  [reviewed the + covid testing lab result with patient,  provided online education on covid, medicines/treatment, isolation, care  in the home or outside of the home]  Communication with --  [spoke with [name], Triage nurse to get assist with a prescription for patient & her significant other]  Education Interventions   Education Provided Provided Web-based Education, Provided Education  Provided Verbal Education On Medication, Other  [updated Mr [name] when he called RN CM, answered questions about covid antibody testing at [facility] urgent care centers, home covid tests, long covid]           [phone] SDOH assessments and interventions completed:  Yes     Care Coordination Interventions:  Yes, provided   Follow up plan: Follow up call scheduled for 09/24/22     Encounter Outcome:  Pt. Visit Completed   Ciin Brazzel L. Lavina Hamman, RN, BSN, [place] Coordinator Office number [phone]

## 2022-09-16 NOTE — Patient Instructions (Addendum)
Visit Information  Thank you for taking time to visit with me today. Please don't hesitate to contact me if I can be of assistance to you.   Following are the goals we discussed today:   Goals Addressed               This Visit's Progress     Patient Stated     "I need a power W/C and a ramp" (THN) (pt-stated)        Care Coordination Interventions: Evaluation of current treatment plan related to impaired physical mobility and patient's adherence to plan as established by provider Determined patient is scheduled to f/u with [facility] PT for a face to face visit for evaluation of her power W/C on 08/07/22 Reviewed and discussed with patient ramp resource provided by Daneen Schick, BSW vocational rehab for independent living [place] in [place] outbound call to [place] for [place] [phone], left a voice message regarding patient's need for assistance with a W/C ramp, provided contact number for this RN requesting a return call          Manage Covid symptoms (THN) (pt-stated)   Not on track     Care Coordination Interventions: Provided education to patient to enhance basic understanding of COVID-19 as a viral disease, measures to prevent exposure, signs and symptoms, recommended vaccine schedule, when to contact provider Counseled on importance of regular laboratory monitoring as prescribed Provided FDA vaccine guidelines Screening for signs and symptoms of depression;  Discussed effects, symptoms, and management of "long COVID"' Assessed social determinant of health barriers     Interventions Today    Flowsheet Row Most Recent Value  Chronic Disease   Chronic disease during today's visit Other, Chronic Kidney Disease/End Stage Renal Disease (ESRD)  General Interventions   General Interventions Discussed/Reviewed --  [reviewed the + covid testing lab result with patient,  provided online education on  covid, medicines/treatment, isolation, care  in the home or outside of the home]  Communication with --  [spoke with Nanette, Triage nurse to get assist with a prescription for patient & her significant other]  Education Interventions   Education Provided Provided Web-based Education, Provided Education  Provided Verbal Education On Medication, Other  [updated Mr Clovis Riley when he called RN CM, answered questions about covid antibody testing at [facility] urgent care centers, home covid tests, long covid]            Our next appointment is by telephone on 09/24/22 at 3:30  Please call the care guide team at [phone] if you need to cancel or reschedule your appointment.   If you are experiencing a Mental Health or [place] or need someone to talk to, please call the Suicide and Crisis Lifeline: 988 call the [place] National Suicide Prevention Lifeline: 787-548-7527 or TTY: 518-185-1878 TTY 360-543-6407) to talk to a trained counselor call 1-800-273-TALK (toll free, 24 hour hotline) go to [facility] [address], [place] [phone]) call the [place]: [phone] call 911   Patient verbalizes understanding of instructions and care plan provided today and agrees to view in [place]. Active MyChart status and patient understanding of how to access instructions and care plan via MyChart confirmed with patient.     The patient has been provided with contact information for the care management team and has been advised to call with any health related questions or concerns.    Joelene Millin  Yetta Glassman, RN, BSN, [employer] Coordinator Office number [phone]

## 2022-09-18 DIAGNOSIS — N186 End stage renal disease: Secondary | ICD-10-CM | POA: Diagnosis not present

## 2022-09-18 DIAGNOSIS — D631 Anemia in chronic kidney disease: Secondary | ICD-10-CM | POA: Diagnosis not present

## 2022-09-18 DIAGNOSIS — N2581 Secondary hyperparathyroidism of renal origin: Secondary | ICD-10-CM | POA: Diagnosis not present

## 2022-09-18 DIAGNOSIS — Z992 Dependence on renal dialysis: Secondary | ICD-10-CM | POA: Diagnosis not present

## 2022-09-20 DIAGNOSIS — Z992 Dependence on renal dialysis: Secondary | ICD-10-CM | POA: Diagnosis not present

## 2022-09-20 DIAGNOSIS — N186 End stage renal disease: Secondary | ICD-10-CM | POA: Diagnosis not present

## 2022-09-20 DIAGNOSIS — D631 Anemia in chronic kidney disease: Secondary | ICD-10-CM | POA: Diagnosis not present

## 2022-09-20 DIAGNOSIS — N2581 Secondary hyperparathyroidism of renal origin: Secondary | ICD-10-CM | POA: Diagnosis not present

## 2022-09-23 DIAGNOSIS — Z992 Dependence on renal dialysis: Secondary | ICD-10-CM | POA: Diagnosis not present

## 2022-09-23 DIAGNOSIS — D631 Anemia in chronic kidney disease: Secondary | ICD-10-CM | POA: Diagnosis not present

## 2022-09-23 DIAGNOSIS — N2581 Secondary hyperparathyroidism of renal origin: Secondary | ICD-10-CM | POA: Diagnosis not present

## 2022-09-23 DIAGNOSIS — N186 End stage renal disease: Secondary | ICD-10-CM | POA: Diagnosis not present

## 2022-09-24 ENCOUNTER — Ambulatory Visit: Payer: Self-pay | Admitting: *Deleted

## 2022-09-24 NOTE — Patient Outreach (Signed)
  Care Coordination   09/24/2022 Name: [name] MRN: [id number] DOB: 17-Jun-1991   Care Coordination Outreach Attempts:  An unsuccessful telephone outreach was attempted today to offer the patient information about available care coordination services as a benefit of their health plan.   Follow Up Plan:  Additional outreach attempts will be made to offer the patient care coordination information and services.   Encounter Outcome:  No Answer   Care Coordination Interventions:  No, not indicated    Jericca Russett L. Lavina Hamman, RN, BSN, [place] Coordinator Office number [phone]

## 2022-09-25 DIAGNOSIS — D631 Anemia in chronic kidney disease: Secondary | ICD-10-CM | POA: Diagnosis not present

## 2022-09-25 DIAGNOSIS — Z992 Dependence on renal dialysis: Secondary | ICD-10-CM | POA: Diagnosis not present

## 2022-09-25 DIAGNOSIS — N186 End stage renal disease: Secondary | ICD-10-CM | POA: Diagnosis not present

## 2022-09-25 DIAGNOSIS — N2581 Secondary hyperparathyroidism of renal origin: Secondary | ICD-10-CM | POA: Diagnosis not present

## 2022-09-26 DIAGNOSIS — Z992 Dependence on renal dialysis: Secondary | ICD-10-CM | POA: Diagnosis not present

## 2022-09-26 DIAGNOSIS — I120 Hypertensive chronic kidney disease with stage 5 chronic kidney disease or end stage renal disease: Secondary | ICD-10-CM | POA: Diagnosis not present

## 2022-09-26 DIAGNOSIS — N186 End stage renal disease: Secondary | ICD-10-CM | POA: Diagnosis not present

## 2022-09-27 DIAGNOSIS — Z992 Dependence on renal dialysis: Secondary | ICD-10-CM | POA: Diagnosis not present

## 2022-09-27 DIAGNOSIS — N2581 Secondary hyperparathyroidism of renal origin: Secondary | ICD-10-CM | POA: Diagnosis not present

## 2022-09-27 DIAGNOSIS — N186 End stage renal disease: Secondary | ICD-10-CM | POA: Diagnosis not present

## 2022-09-27 DIAGNOSIS — D631 Anemia in chronic kidney disease: Secondary | ICD-10-CM | POA: Diagnosis not present

## 2022-09-30 DIAGNOSIS — D631 Anemia in chronic kidney disease: Secondary | ICD-10-CM | POA: Diagnosis not present

## 2022-09-30 DIAGNOSIS — N2581 Secondary hyperparathyroidism of renal origin: Secondary | ICD-10-CM | POA: Diagnosis not present

## 2022-09-30 DIAGNOSIS — Z992 Dependence on renal dialysis: Secondary | ICD-10-CM | POA: Diagnosis not present

## 2022-09-30 DIAGNOSIS — N186 End stage renal disease: Secondary | ICD-10-CM | POA: Diagnosis not present

## 2022-09-30 NOTE — Progress Notes (Deleted)
NEUROLOGY CONSULTATION NOTE  JAYDYNN SEYFARTH MRN: [id number] DOB: [birth year]  Referring provider: Mila Merry, FNP Primary care provider: Mila Merry, FNP  Reason for consult:  migraines  Assessment/Plan:   ***   Subjective:  [name] is a 32 year old female with ESRD on HD, HTN, and spina bifida who presents for migraines.  History supplemented by referring provider's note.  Onset:  *** Location:  *** Quality:  *** Intensity:  ***.  *** denies new headache, thunderclap headache or severe headache that wakes *** from sleep. Aura:  *** Prodrome:  *** Postdrome:  *** Associated symptoms:  ***.  *** denies associated unilateral numbness or weakness. Duration:  *** Frequency:  *** Frequency of abortive medication: *** Triggers:  *** Relieving factors:  *** Activity:  ***  CT head on 04/28/2022 personally reviewed was unremarkable.  Past NSAIDS/analgesics:  ibuprofen, tramadol Past abortive triptans:  *** Past abortive ergotamine:  *** Past muscle relaxants:  *** Past anti-emetic:  Reglan, Phenergan Past antihypertensive medications:  amlodipine Past antidepressant medications:  sertraline Past anticonvulsant medications:  gabapentin Past anti-CGRP:  *** Past vitamins/Herbal/Supplements:  *** Past antihistamines/decongestants:  *** Other past therapies:  ***  Current NSAIDS/analgesics:  acetaminophen Current triptans:  *** Current ergotamine:  *** Current anti-emetic:  Zofran-ODT '4mg'$  Current muscle relaxants:  *** Current Antihypertensive medications:  metoprolol succinate Current Antidepressant medications:  *** Current Anticonvulsant medications:  *** Current anti-CGRP:  *** Current Vitamins/Herbal/Supplements:  *** Current Antihistamines/Decongestants:  Benadryl Other therapy:  *** Birth control:  *** Other medications:  ***   Caffeine:  *** Alcohol:  *** Smoker:  *** Diet:  *** Exercise:  *** Depression:  ***; Anxiety:  *** Other pain:   *** Sleep hygiene:  *** Family history of headache:  ***      PAST MEDICAL HISTORY: Past Medical History:  Diagnosis Date   Anemia associated with chronic renal failure    Blood transfusion    Caudal regression syndrome    Assoc with spina bifida.   Depression 05/14/2015   Dialysis care    ESRD (end stage renal disease) on dialysis [facility])    GERD (gastroesophageal reflux disease) 01/07/2017   HTN (hypertension) 05/02/2011   Murmur, cardiac 07/17/2022   Spina bifida    UTI (lower urinary tract infection)    Viral URI with cough 09/15/2022    PAST SURGICAL HISTORY: Past Surgical History:  Procedure Laterality Date   A/V FISTULAGRAM Left 10/14/2021   Procedure: A/V Fistulagram;  Surgeon: [name], MD;  Location: [place] [facility];  Service: Cardiovascular;  Laterality: Left;   AV FISTULA PLACEMENT     left arm   INSERTION OF DIALYSIS CATHETER N/A 05/08/2020   Procedure: ATTEMPTED, UNSUCCESSFUL INSERTION OF DIALYSIS CATHETER TUNNELED RIGHT INTERNAL JUGULAR;  Surgeon: Virl Cagey, MD;  Location: [facility];  Service: General;  Laterality: N/A;   IR FLUORO GUIDE CV LINE RIGHT  05/09/2020   IR US GUIDE VASC ACCESS RIGHT  05/09/2020    MEDICATIONS: Current Outpatient Medications on File Prior to Visit  Medication Sig Dispense Refill   acetaminophen (TYLENOL) 500 MG tablet Take 1,000 mg by mouth every 6 (six) hours as needed for mild pain or headache.     albuterol (VENTOLIN HFA) 108 (90 Base) MCG/ACT inhaler Inhale 1-2 puffs into the lungs every 6 (six) hours as needed for wheezing or shortness of breath. 1 each 0   amoxicillin-clavulanate (AUGMENTIN) 875-125 MG tablet Take 1 tablet by mouth every 12 (twelve)  hours. 14 tablet 0   AURYXIA 1 GM 210 MG(Fe) tablet Take 630 mg by mouth 3 (three) times daily with meals.  3   B Complex-C-Folic Acid (DIALYVITE TABLET) TABS Take 1 tablet by mouth daily.     diphenhydrAMINE (BENADRYL) 25 mg capsule Take 25 mg by mouth  daily as needed for allergies.     Doxercalciferol (HECTOROL IV) Given at dialysis     Epoetin Alfa (EPOGEN IJ) Inject as directed See admin instructions. Every Tuesday, Thursday, Saturday     hydrALAZINE (APRESOLINE) 25 MG tablet Take 25 mg by mouth 3 (three) times daily.     Methoxy PEG-Epoetin Beta (MIRCERA IJ) Mircera     metoprolol succinate (TOPROL-XL) 100 MG 24 hr tablet Take 100 mg by mouth daily.     omeprazole (PRILOSEC) 40 MG capsule Take 40 mg by mouth every morning.     ondansetron (ZOFRAN) 4 MG tablet Take 1 tablet (4 mg total) by mouth every 6 (six) hours as needed for nausea or vomiting. 12 tablet 0   ondansetron (ZOFRAN-ODT) 4 MG disintegrating tablet Take 1 tablet (4 mg total) by mouth every 8 (eight) hours as needed. 20 tablet 0   sodium zirconium cyclosilicate (LOKELMA) 5 g packet Take 10 g by mouth every Monday, Wednesday, and Friday.     No current facility-administered medications on file prior to visit.    ALLERGIES: Allergies  Allergen Reactions   Ciprofloxacin Shortness Of Breath, Nausea And Vomiting and Other (See Comments)    HIGH FEVER and oral blisters    Other Anaphylaxis    Revaclear dialzer   Peanut-Containing Drug Products Anaphylaxis   Aleve [Naproxen Sodium] Other (See Comments)    G.I.Bleed   Ceftriaxone Other (See Comments)    Blisters in mouth    Coconut (Cocos Nucifera) Hives   Influenza Vaccines Nausea And Vomiting and Other (See Comments)    High fever   Tetanus Toxoid, Adsorbed Nausea And Vomiting and Other (See Comments)    HIGH FEVER, also   Tetanus Toxoids Nausea And Vomiting and Other (See Comments)    HIGH FEVER   Latex Itching and Rash    FAMILY HISTORY: Family History  Problem Relation Age of Onset   Kidney cancer Other    Hypertension Maternal Grandmother    Arthritis Maternal Grandmother    Breast cancer Maternal Aunt    Colon cancer Neg Hx     Objective:  *** General: No acute distress.  Patient appears  well-groomed.   Head:  Normocephalic/atraumatic Eyes:  fundi examined but not visualized Neck: supple, no paraspinal tenderness, full range of motion Back: No paraspinal tenderness Heart: regular rate and rhythm Lungs: Clear to auscultation bilaterally. Vascular: No carotid bruits. Neurological Exam: Mental status: alert and oriented to person, place, and time, speech fluent and not dysarthric, language intact. Cranial nerves: CN I: not tested CN II: pupils equal, round and reactive to light, visual fields intact CN III, IV, VI:  full range of motion, no nystagmus, no ptosis CN V: facial sensation intact. CN VII: upper and lower face symmetric CN VIII: hearing intact CN IX, X: gag intact, uvula midline CN XI: sternocleidomastoid and trapezius muscles intact CN XII: tongue midline Bulk & Tone: normal, no fasciculations. Motor:  muscle strength 5/5 throughout Sensation:  Pinprick, temperature and vibratory sensation intact. Deep Tendon Reflexes:  2+ throughout,  toes downgoing.   Finger to nose testing:  Without dysmetria.   Heel to shin:  Without dysmetria.   Gait:  Normal station and stride.  Romberg negative.    Thank you for allowing me to take part in the care of this patient.  Metta Clines, DO  CC: ***

## 2022-10-01 ENCOUNTER — Ambulatory Visit: Payer: Medicare Other | Admitting: Neurology

## 2022-10-02 DIAGNOSIS — N2581 Secondary hyperparathyroidism of renal origin: Secondary | ICD-10-CM | POA: Diagnosis not present

## 2022-10-02 DIAGNOSIS — N186 End stage renal disease: Secondary | ICD-10-CM | POA: Diagnosis not present

## 2022-10-02 DIAGNOSIS — D631 Anemia in chronic kidney disease: Secondary | ICD-10-CM | POA: Diagnosis not present

## 2022-10-02 DIAGNOSIS — Z992 Dependence on renal dialysis: Secondary | ICD-10-CM | POA: Diagnosis not present

## 2022-10-04 DIAGNOSIS — D631 Anemia in chronic kidney disease: Secondary | ICD-10-CM | POA: Diagnosis not present

## 2022-10-04 DIAGNOSIS — Z992 Dependence on renal dialysis: Secondary | ICD-10-CM | POA: Diagnosis not present

## 2022-10-04 DIAGNOSIS — N2581 Secondary hyperparathyroidism of renal origin: Secondary | ICD-10-CM | POA: Diagnosis not present

## 2022-10-04 DIAGNOSIS — N186 End stage renal disease: Secondary | ICD-10-CM | POA: Diagnosis not present

## 2022-10-07 DIAGNOSIS — N2581 Secondary hyperparathyroidism of renal origin: Secondary | ICD-10-CM | POA: Diagnosis not present

## 2022-10-07 DIAGNOSIS — D631 Anemia in chronic kidney disease: Secondary | ICD-10-CM | POA: Diagnosis not present

## 2022-10-07 DIAGNOSIS — Z992 Dependence on renal dialysis: Secondary | ICD-10-CM | POA: Diagnosis not present

## 2022-10-07 DIAGNOSIS — N186 End stage renal disease: Secondary | ICD-10-CM | POA: Diagnosis not present

## 2022-10-09 DIAGNOSIS — Z992 Dependence on renal dialysis: Secondary | ICD-10-CM | POA: Diagnosis not present

## 2022-10-09 DIAGNOSIS — N2581 Secondary hyperparathyroidism of renal origin: Secondary | ICD-10-CM | POA: Diagnosis not present

## 2022-10-09 DIAGNOSIS — D631 Anemia in chronic kidney disease: Secondary | ICD-10-CM | POA: Diagnosis not present

## 2022-10-09 DIAGNOSIS — N186 End stage renal disease: Secondary | ICD-10-CM | POA: Diagnosis not present

## 2022-10-11 DIAGNOSIS — D631 Anemia in chronic kidney disease: Secondary | ICD-10-CM | POA: Diagnosis not present

## 2022-10-11 DIAGNOSIS — N186 End stage renal disease: Secondary | ICD-10-CM | POA: Diagnosis not present

## 2022-10-11 DIAGNOSIS — N2581 Secondary hyperparathyroidism of renal origin: Secondary | ICD-10-CM | POA: Diagnosis not present

## 2022-10-11 DIAGNOSIS — Z992 Dependence on renal dialysis: Secondary | ICD-10-CM | POA: Diagnosis not present

## 2022-10-14 DIAGNOSIS — N2581 Secondary hyperparathyroidism of renal origin: Secondary | ICD-10-CM | POA: Diagnosis not present

## 2022-10-14 DIAGNOSIS — D631 Anemia in chronic kidney disease: Secondary | ICD-10-CM | POA: Diagnosis not present

## 2022-10-14 DIAGNOSIS — Z992 Dependence on renal dialysis: Secondary | ICD-10-CM | POA: Diagnosis not present

## 2022-10-14 DIAGNOSIS — N186 End stage renal disease: Secondary | ICD-10-CM | POA: Diagnosis not present

## 2022-10-16 DIAGNOSIS — N186 End stage renal disease: Secondary | ICD-10-CM | POA: Diagnosis not present

## 2022-10-16 DIAGNOSIS — D631 Anemia in chronic kidney disease: Secondary | ICD-10-CM | POA: Diagnosis not present

## 2022-10-16 DIAGNOSIS — Z992 Dependence on renal dialysis: Secondary | ICD-10-CM | POA: Diagnosis not present

## 2022-10-16 DIAGNOSIS — N2581 Secondary hyperparathyroidism of renal origin: Secondary | ICD-10-CM | POA: Diagnosis not present

## 2022-10-18 DIAGNOSIS — N186 End stage renal disease: Secondary | ICD-10-CM | POA: Diagnosis not present

## 2022-10-18 DIAGNOSIS — Z992 Dependence on renal dialysis: Secondary | ICD-10-CM | POA: Diagnosis not present

## 2022-10-18 DIAGNOSIS — D631 Anemia in chronic kidney disease: Secondary | ICD-10-CM | POA: Diagnosis not present

## 2022-10-18 DIAGNOSIS — N2581 Secondary hyperparathyroidism of renal origin: Secondary | ICD-10-CM | POA: Diagnosis not present

## 2022-10-21 DIAGNOSIS — N2581 Secondary hyperparathyroidism of renal origin: Secondary | ICD-10-CM | POA: Diagnosis not present

## 2022-10-21 DIAGNOSIS — N186 End stage renal disease: Secondary | ICD-10-CM | POA: Diagnosis not present

## 2022-10-21 DIAGNOSIS — Z992 Dependence on renal dialysis: Secondary | ICD-10-CM | POA: Diagnosis not present

## 2022-10-21 DIAGNOSIS — D631 Anemia in chronic kidney disease: Secondary | ICD-10-CM | POA: Diagnosis not present

## 2022-10-23 DIAGNOSIS — N186 End stage renal disease: Secondary | ICD-10-CM | POA: Diagnosis not present

## 2022-10-23 DIAGNOSIS — D631 Anemia in chronic kidney disease: Secondary | ICD-10-CM | POA: Diagnosis not present

## 2022-10-23 DIAGNOSIS — Z992 Dependence on renal dialysis: Secondary | ICD-10-CM | POA: Diagnosis not present

## 2022-10-23 DIAGNOSIS — N2581 Secondary hyperparathyroidism of renal origin: Secondary | ICD-10-CM | POA: Diagnosis not present

## 2022-10-25 DIAGNOSIS — N2581 Secondary hyperparathyroidism of renal origin: Secondary | ICD-10-CM | POA: Diagnosis not present

## 2022-10-25 DIAGNOSIS — N186 End stage renal disease: Secondary | ICD-10-CM | POA: Diagnosis not present

## 2022-10-25 DIAGNOSIS — D631 Anemia in chronic kidney disease: Secondary | ICD-10-CM | POA: Diagnosis not present

## 2022-10-25 DIAGNOSIS — Z992 Dependence on renal dialysis: Secondary | ICD-10-CM | POA: Diagnosis not present

## 2022-10-27 DIAGNOSIS — Z992 Dependence on renal dialysis: Secondary | ICD-10-CM | POA: Diagnosis not present

## 2022-10-27 DIAGNOSIS — N186 End stage renal disease: Secondary | ICD-10-CM | POA: Diagnosis not present

## 2022-10-27 DIAGNOSIS — I120 Hypertensive chronic kidney disease with stage 5 chronic kidney disease or end stage renal disease: Secondary | ICD-10-CM | POA: Diagnosis not present

## 2022-10-28 DIAGNOSIS — Z992 Dependence on renal dialysis: Secondary | ICD-10-CM | POA: Diagnosis not present

## 2022-10-28 DIAGNOSIS — N2581 Secondary hyperparathyroidism of renal origin: Secondary | ICD-10-CM | POA: Diagnosis not present

## 2022-10-28 DIAGNOSIS — N186 End stage renal disease: Secondary | ICD-10-CM | POA: Diagnosis not present

## 2022-10-28 DIAGNOSIS — D631 Anemia in chronic kidney disease: Secondary | ICD-10-CM | POA: Diagnosis not present

## 2022-10-30 DIAGNOSIS — D631 Anemia in chronic kidney disease: Secondary | ICD-10-CM | POA: Diagnosis not present

## 2022-10-30 DIAGNOSIS — N2581 Secondary hyperparathyroidism of renal origin: Secondary | ICD-10-CM | POA: Diagnosis not present

## 2022-10-30 DIAGNOSIS — N186 End stage renal disease: Secondary | ICD-10-CM | POA: Diagnosis not present

## 2022-10-30 DIAGNOSIS — Z992 Dependence on renal dialysis: Secondary | ICD-10-CM | POA: Diagnosis not present

## 2022-11-01 DIAGNOSIS — N186 End stage renal disease: Secondary | ICD-10-CM | POA: Diagnosis not present

## 2022-11-01 DIAGNOSIS — Z992 Dependence on renal dialysis: Secondary | ICD-10-CM | POA: Diagnosis not present

## 2022-11-01 DIAGNOSIS — D631 Anemia in chronic kidney disease: Secondary | ICD-10-CM | POA: Diagnosis not present

## 2022-11-01 DIAGNOSIS — N2581 Secondary hyperparathyroidism of renal origin: Secondary | ICD-10-CM | POA: Diagnosis not present

## 2022-11-04 DIAGNOSIS — D631 Anemia in chronic kidney disease: Secondary | ICD-10-CM | POA: Diagnosis not present

## 2022-11-04 DIAGNOSIS — Z992 Dependence on renal dialysis: Secondary | ICD-10-CM | POA: Diagnosis not present

## 2022-11-04 DIAGNOSIS — N186 End stage renal disease: Secondary | ICD-10-CM | POA: Diagnosis not present

## 2022-11-04 DIAGNOSIS — N2581 Secondary hyperparathyroidism of renal origin: Secondary | ICD-10-CM | POA: Diagnosis not present

## 2022-11-06 DIAGNOSIS — Z992 Dependence on renal dialysis: Secondary | ICD-10-CM | POA: Diagnosis not present

## 2022-11-06 DIAGNOSIS — N2581 Secondary hyperparathyroidism of renal origin: Secondary | ICD-10-CM | POA: Diagnosis not present

## 2022-11-06 DIAGNOSIS — N186 End stage renal disease: Secondary | ICD-10-CM | POA: Diagnosis not present

## 2022-11-06 DIAGNOSIS — D631 Anemia in chronic kidney disease: Secondary | ICD-10-CM | POA: Diagnosis not present

## 2022-11-08 DIAGNOSIS — D631 Anemia in chronic kidney disease: Secondary | ICD-10-CM | POA: Diagnosis not present

## 2022-11-08 DIAGNOSIS — N186 End stage renal disease: Secondary | ICD-10-CM | POA: Diagnosis not present

## 2022-11-08 DIAGNOSIS — N2581 Secondary hyperparathyroidism of renal origin: Secondary | ICD-10-CM | POA: Diagnosis not present

## 2022-11-08 DIAGNOSIS — Z992 Dependence on renal dialysis: Secondary | ICD-10-CM | POA: Diagnosis not present

## 2022-11-11 DIAGNOSIS — N2581 Secondary hyperparathyroidism of renal origin: Secondary | ICD-10-CM | POA: Diagnosis not present

## 2022-11-11 DIAGNOSIS — D631 Anemia in chronic kidney disease: Secondary | ICD-10-CM | POA: Diagnosis not present

## 2022-11-11 DIAGNOSIS — N186 End stage renal disease: Secondary | ICD-10-CM | POA: Diagnosis not present

## 2022-11-11 DIAGNOSIS — Z992 Dependence on renal dialysis: Secondary | ICD-10-CM | POA: Diagnosis not present

## 2022-11-13 DIAGNOSIS — Z992 Dependence on renal dialysis: Secondary | ICD-10-CM | POA: Diagnosis not present

## 2022-11-13 DIAGNOSIS — N2581 Secondary hyperparathyroidism of renal origin: Secondary | ICD-10-CM | POA: Diagnosis not present

## 2022-11-13 DIAGNOSIS — N186 End stage renal disease: Secondary | ICD-10-CM | POA: Diagnosis not present

## 2022-11-13 DIAGNOSIS — D631 Anemia in chronic kidney disease: Secondary | ICD-10-CM | POA: Diagnosis not present

## 2022-11-15 DIAGNOSIS — N2581 Secondary hyperparathyroidism of renal origin: Secondary | ICD-10-CM | POA: Diagnosis not present

## 2022-11-15 DIAGNOSIS — D631 Anemia in chronic kidney disease: Secondary | ICD-10-CM | POA: Diagnosis not present

## 2022-11-15 DIAGNOSIS — Z992 Dependence on renal dialysis: Secondary | ICD-10-CM | POA: Diagnosis not present

## 2022-11-15 DIAGNOSIS — N186 End stage renal disease: Secondary | ICD-10-CM | POA: Diagnosis not present

## 2022-11-17 ENCOUNTER — Ambulatory Visit (INDEPENDENT_AMBULATORY_CARE_PROVIDER_SITE_OTHER): Payer: Medicare Other | Admitting: Family Medicine

## 2022-11-17 ENCOUNTER — Encounter: Payer: Self-pay | Admitting: Family Medicine

## 2022-11-17 VITALS — BP 142/100 | HR 74 | Temp 97.6°F | Ht <= 58 in

## 2022-11-17 DIAGNOSIS — L84 Corns and callosities: Secondary | ICD-10-CM | POA: Diagnosis not present

## 2022-11-17 MED ORDER — CALMOSEPTINE 0.44-20.6 % EX OINT: 1.0000 | TOPICAL_OINTMENT | Freq: Every day | CUTANEOUS | 1 refills | Status: DC

## 2022-11-17 NOTE — Progress Notes (Signed)
   Acute Office Visit  Subjective:     Patient ID: [name], female    DOB: 1991-06-03, 32 y.o.   MRN: [id number]  Chief Complaint  Patient presents with   Acute Visit     compression sore on foot - JBG\\\  Pt wants a referral to allergist as well       HPI Patient is in today for concerns about a sore on her left foot that has been present for many months. It is painful, is located where she sits on it and she is having difficulty relieving the pressure due to her contractures. The area is not open, no redness, warmth, or drainage.  Review of Systems  All other systems reviewed and are negative.   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   Depression 05/14/2015   Dialysis care    ESRD (end stage renal disease) on dialysis    GERD (gastroesophageal reflux disease) 01/07/2017   HTN (hypertension) 05/02/2011   Murmur, cardiac 07/17/2022   Spina bifida    UTI (lower urinary tract infection)    Viral URI with cough 09/15/2022       Objective:    BP (!) 142/100   Pulse 74   Temp 97.6 F (36.4 C) (Oral)   Ht 2' (0.61 m)   SpO2 99%   BMI 85.44 kg/m    Physical Exam Vitals and nursing note reviewed.  Constitutional:      Appearance: Normal appearance. She is normal weight.  HENT:     Head: Normocephalic and atraumatic.  Skin:    General: Skin is warm and dry.     Findings: Wound (callus to left ankle 1.5x1.5cm) present.  Neurological:     General: No focal deficit present.     Mental Status: She is alert and oriented to person, place, and time. Mental status is at baseline.  Psychiatric:        Mood and Affect: Mood normal.        Behavior: Behavior normal.        Thought Content: Thought content normal.        Judgment: Judgment normal.     No results found for any visits on 11/17/22.      Assessment & Plan:   Problem  List Items Addressed This Visit     Callus of foot - Primary    Patient has a tender callus to her left foot, located where she sits throughout the day. This area is difficult to protect from pressure given her contracture and MSK anatomy. She currently uses a gel cushion. I would recommend she use a geomatt for pressure relief. I also suggest using Calmoseptine to prevent moisture and friction causing further breakdown.      Relevant Orders   pressure redistribution chair pad    Meds ordered this encounter  Medications   Menthol-Zinc Oxide (CALMOSEPTINE) 0.44-20.6 % OINT    Sig: Apply 1 Application topically daily.    Dispense:  71 g    Refill:  1    Order Specific Question:   Supervising Provider    Answer:   Lynnea Ferrier T [[id number]]    Return if symptoms worsen or fail to improve.  Park Meo, FNP

## 2022-11-17 NOTE — Assessment & Plan Note (Signed)
Patient has a tender callus to her left foot, located where she sits throughout the day. This area is difficult to protect from pressure given her contracture and MSK anatomy. She currently uses a gel cushion. I would recommend she use a geomatt for pressure relief. I also suggest using Calmoseptine to prevent moisture and friction causing further breakdown.

## 2022-11-18 DIAGNOSIS — N2581 Secondary hyperparathyroidism of renal origin: Secondary | ICD-10-CM | POA: Diagnosis not present

## 2022-11-18 DIAGNOSIS — N186 End stage renal disease: Secondary | ICD-10-CM | POA: Diagnosis not present

## 2022-11-18 DIAGNOSIS — D631 Anemia in chronic kidney disease: Secondary | ICD-10-CM | POA: Diagnosis not present

## 2022-11-18 DIAGNOSIS — Z992 Dependence on renal dialysis: Secondary | ICD-10-CM | POA: Diagnosis not present

## 2022-11-20 DIAGNOSIS — N186 End stage renal disease: Secondary | ICD-10-CM | POA: Diagnosis not present

## 2022-11-20 DIAGNOSIS — Z992 Dependence on renal dialysis: Secondary | ICD-10-CM | POA: Diagnosis not present

## 2022-11-20 DIAGNOSIS — N2581 Secondary hyperparathyroidism of renal origin: Secondary | ICD-10-CM | POA: Diagnosis not present

## 2022-11-20 DIAGNOSIS — D631 Anemia in chronic kidney disease: Secondary | ICD-10-CM | POA: Diagnosis not present

## 2022-11-22 DIAGNOSIS — D631 Anemia in chronic kidney disease: Secondary | ICD-10-CM | POA: Diagnosis not present

## 2022-11-22 DIAGNOSIS — N2581 Secondary hyperparathyroidism of renal origin: Secondary | ICD-10-CM | POA: Diagnosis not present

## 2022-11-22 DIAGNOSIS — N186 End stage renal disease: Secondary | ICD-10-CM | POA: Diagnosis not present

## 2022-11-22 DIAGNOSIS — Z992 Dependence on renal dialysis: Secondary | ICD-10-CM | POA: Diagnosis not present

## 2022-11-25 DIAGNOSIS — Z992 Dependence on renal dialysis: Secondary | ICD-10-CM | POA: Diagnosis not present

## 2022-11-25 DIAGNOSIS — N2581 Secondary hyperparathyroidism of renal origin: Secondary | ICD-10-CM | POA: Diagnosis not present

## 2022-11-25 DIAGNOSIS — D631 Anemia in chronic kidney disease: Secondary | ICD-10-CM | POA: Diagnosis not present

## 2022-11-25 DIAGNOSIS — N186 End stage renal disease: Secondary | ICD-10-CM | POA: Diagnosis not present

## 2022-11-26 ENCOUNTER — Emergency Department (HOSPITAL_COMMUNITY): Payer: Medicare Other

## 2022-11-26 ENCOUNTER — Other Ambulatory Visit: Payer: Self-pay

## 2022-11-26 ENCOUNTER — Emergency Department (HOSPITAL_COMMUNITY)
Admission: EM | Admit: 2022-11-26 | Discharge: 2022-11-26 | Disposition: A | Discharge status: Home / Self Care | Payer: Medicare Other | Attending: Emergency Medicine | Admitting: Emergency Medicine

## 2022-11-26 ENCOUNTER — Encounter (HOSPITAL_COMMUNITY): Payer: Self-pay | Admitting: Emergency Medicine

## 2022-11-26 DIAGNOSIS — N186 End stage renal disease: Secondary | ICD-10-CM | POA: Diagnosis not present

## 2022-11-26 DIAGNOSIS — Z91040 Latex allergy status: Secondary | ICD-10-CM | POA: Insufficient documentation

## 2022-11-26 DIAGNOSIS — Z992 Dependence on renal dialysis: Secondary | ICD-10-CM | POA: Diagnosis not present

## 2022-11-26 DIAGNOSIS — R109 Unspecified abdominal pain: Secondary | ICD-10-CM | POA: Diagnosis not present

## 2022-11-26 DIAGNOSIS — I120 Hypertensive chronic kidney disease with stage 5 chronic kidney disease or end stage renal disease: Secondary | ICD-10-CM | POA: Diagnosis not present

## 2022-11-26 DIAGNOSIS — R188 Other ascites: Secondary | ICD-10-CM | POA: Diagnosis not present

## 2022-11-26 DIAGNOSIS — N3289 Other specified disorders of bladder: Secondary | ICD-10-CM | POA: Diagnosis not present

## 2022-11-26 LAB — CBC WITH DIFFERENTIAL/PLATELET
Abs Immature Granulocytes: 0.01 10*3/uL (ref 0.00–0.07)
Basophils Absolute: 0 10*3/uL (ref 0.0–0.1)
Basophils Relative: 0 %
Eosinophils Absolute: 0.2 10*3/uL (ref 0.0–0.5)
Eosinophils Relative: 3 %
HCT: 31.9 % — ABNORMAL LOW (ref 36.0–46.0)
Hemoglobin: 9.8 g/dL — ABNORMAL LOW (ref 12.0–15.0)
Immature Granulocytes: 0 %
Lymphocytes Relative: 19 %
Lymphs Abs: 1 10*3/uL (ref 0.7–4.0)
MCH: 31.8 pg (ref 26.0–34.0)
MCHC: 30.7 g/dL (ref 30.0–36.0)
MCV: 103.6 fL — ABNORMAL HIGH (ref 80.0–100.0)
Monocytes Absolute: 0.5 10*3/uL (ref 0.1–1.0)
Monocytes Relative: 10 %
Neutro Abs: 3.5 10*3/uL (ref 1.7–7.7)
Neutrophils Relative %: 68 %
Platelets: 109 10*3/uL — ABNORMAL LOW (ref 150–400)
RBC: 3.08 MIL/uL — ABNORMAL LOW (ref 3.87–5.11)
RDW: 15.7 % — ABNORMAL HIGH (ref 11.5–15.5)
WBC: 5.1 10*3/uL (ref 4.0–10.5)
nRBC: 0 % (ref 0.0–0.2)

## 2022-11-26 LAB — BASIC METABOLIC PANEL
Anion gap: 15 (ref 5–15)
BUN: 33 mg/dL — ABNORMAL HIGH (ref 6–20)
CO2: 27 mmol/L (ref 22–32)
Calcium: 9.2 mg/dL (ref 8.9–10.3)
Chloride: 100 mmol/L (ref 98–111)
Creatinine, Ser: 6.02 mg/dL — ABNORMAL HIGH (ref 0.44–1.00)
GFR, Estimated: 9 mL/min — ABNORMAL LOW (ref >60)
Glucose, Bld: 108 mg/dL — ABNORMAL HIGH (ref 70–99)
Potassium: 3.8 mmol/L (ref 3.5–5.1)
Sodium: 142 mmol/L (ref 135–145)

## 2022-11-26 LAB — HCG, QUANTITATIVE, PREGNANCY: hCG, Beta Chain, Quant, S: 2 m[IU]/mL (ref <5)

## 2022-11-26 LAB — I-STAT BETA HCG BLOOD, ED (MC, WL, AP ONLY)
I-stat hCG, quantitative: <5 m[IU]/mL (ref <5)
I-stat hCG, quantitative: NEGATIVE

## 2022-11-26 MED ORDER — DIPHENHYDRAMINE HCL 50 MG/ML IJ SOLN
25.0000 mg | Freq: Once | INTRAMUSCULAR | Status: AC
  Administered 2022-11-26: 25 mg via INTRAVENOUS
  Filled 2022-11-26: qty 1

## 2022-11-26 MED ORDER — ONDANSETRON HCL 4 MG/2ML IJ SOLN
4.0000 mg | Freq: Once | INTRAMUSCULAR | Status: AC
  Administered 2022-11-26: 4 mg via INTRAVENOUS
  Filled 2022-11-26: qty 2

## 2022-11-26 MED ORDER — MORPHINE SULFATE (PF) 4 MG/ML IV SOLN
4.0000 mg | Freq: Once | INTRAVENOUS | Status: AC
  Administered 2022-11-26: 4 mg via INTRAVENOUS
  Filled 2022-11-26: qty 1

## 2022-11-26 MED ORDER — SULFAMETHOXAZOLE-TRIMETHOPRIM 800-160 MG PO TABS
1.0000 | ORAL_TABLET | Freq: Once | ORAL | Status: AC
  Administered 2022-11-26: 1 via ORAL
  Filled 2022-11-26: qty 1

## 2022-11-26 MED ORDER — SULFAMETHOXAZOLE-TRIMETHOPRIM 800-160 MG PO TABS: 1.0000 | ORAL_TABLET | Freq: Two times a day (BID) | ORAL | 0 refills | Status: AC

## 2022-11-26 NOTE — ED Triage Notes (Signed)
Pt with c/o L flank pain since yesterday.

## 2022-11-26 NOTE — Discharge Instructions (Signed)
Begin taking Bactrim as prescribed.  Follow-up with primary doctor if not improving in the next few days, and return to the ER if symptoms significantly worsen or change.

## 2022-11-26 NOTE — ED Provider Notes (Signed)
[place] EMERGENCY DEPARTMENT AT [facility] Provider Note   CSN: [id number] Arrival date & time: 11/26/22  0316     History  Chief Complaint  Patient presents with   Flank Pain    [name] is a 32 y.o. female.  Patient is a 32 year old female with past medical history of spina bifida, end-stage renal disease on hemodialysis, GERD, pyelonephritis.  Patient presenting today with complaints of left-sided flank and abdominal pain.  This has been ongoing for the past 2 days.  She denies any fevers or chills.  She denies constipation, but has had 2 episodes of loose stool.  She is on dialysis, however does make some urine.  She tells me she has had a urinary tract infection in the past that feels similar.  The history is provided by the patient.       Home Medications Prior to Admission medications   Medication Sig Start Date End Date Taking? Authorizing Provider  acetaminophen (TYLENOL) 500 MG tablet Take 1,000 mg by mouth every 6 (six) hours as needed for mild pain or headache.    [provider]  albuterol (VENTOLIN HFA) 108 (90 Base) MCG/ACT inhaler Inhale 1-2 puffs into the lungs every 6 (six) hours as needed for wheezing or shortness of breath. 09/15/22   Park Meo, FNP  amoxicillin-clavulanate (AUGMENTIN) 875-125 MG tablet Take 1 tablet by mouth every 12 (twelve) hours. Patient not taking: Reported on 11/17/2022 09/04/22   Mesner, Barbara Cower, MD  AURYXIA 1 GM 210 MG(Fe) tablet Take 630 mg by mouth 3 (three) times daily with meals. 04/15/18   [provider]  B Complex-C-Folic Acid (DIALYVITE TABLET) TABS Take 1 tablet by mouth daily.    [provider]  diphenhydrAMINE (BENADRYL) 25 mg capsule Take 25 mg by mouth daily as needed for allergies.    [provider]  Doxercalciferol (HECTOROL IV) Given at dialysis 01/07/22 01/06/23  [provider]  Epoetin Alfa (EPOGEN IJ) Inject as directed See admin instructions. Every  Tuesday, Thursday, Saturday    [provider]  hydrALAZINE (APRESOLINE) 25 MG tablet Take 25 mg by mouth 3 (three) times daily. 04/28/20   [provider]  Menthol-Zinc Oxide (CALMOSEPTINE) 0.44-20.6 % OINT Apply 1 Application topically daily. 11/17/22   Park Meo, FNP  Methoxy PEG-Epoetin Beta (MIRCERA IJ) Mircera 04/17/22 04/16/23  [provider]  metoprolol succinate (TOPROL-XL) 100 MG 24 hr tablet Take 100 mg by mouth daily. 12/29/19   [provider]  omeprazole (PRILOSEC) 40 MG capsule Take 40 mg by mouth every morning. 01/08/22   [provider]  ondansetron (ZOFRAN) 4 MG tablet Take 1 tablet (4 mg total) by mouth every 6 (six) hours as needed for nausea or vomiting. Patient not taking: Reported on 11/17/2022 04/04/21   Carroll Sage, PA-C  ondansetron (ZOFRAN-ODT) 4 MG disintegrating tablet Take 1 tablet (4 mg total) by mouth every 8 (eight) hours as needed. 06/13/22   Long, Arlyss Repress, MD  sodium zirconium cyclosilicate (LOKELMA) 5 g packet Take 10 g by mouth every Monday, Wednesday, and Friday. 07/20/21   [provider]      Allergies    Ciprofloxacin; Other; Peanut-containing drug products; Aleve [naproxen sodium]; Ceftriaxone; Coconut (cocos nucifera); Influenza vaccines; Tetanus toxoid, adsorbed; Tetanus toxoids; and Latex    Review of Systems   Review of Systems  All other systems reviewed and are negative.   Physical Exam Updated Vital Signs BP 122/80   Pulse 78  Temp 98.2 F (36.8 C) (Oral)   Resp 16   Wt 33.1 kg   SpO2 96%   BMI 89.11 kg/m  Physical Exam Vitals and nursing note reviewed.  Constitutional:      General: She is not in acute distress.    Appearance: She is well-developed. She is not diaphoretic.  HENT:     Head: Normocephalic and atraumatic.  Cardiovascular:     Rate and Rhythm: Normal rate and regular rhythm.     Heart sounds: No murmur heard.    No friction rub. No gallop.  Pulmonary:      Effort: Pulmonary effort is normal. No respiratory distress.     Breath sounds: Normal breath sounds. No wheezing.  Abdominal:     General: Bowel sounds are normal. There is no distension.     Palpations: Abdomen is soft.     Tenderness: There is abdominal tenderness. There is left CVA tenderness. There is no guarding or rebound.     Comments: There is tenderness to palpation in the left upper and left lower quadrants.  Musculoskeletal:     Cervical back: Normal range of motion and neck supple.  Skin:    General: Skin is warm and dry.  Neurological:     General: No focal deficit present.     Mental Status: She is alert and oriented to person, place, and time.     ED Results / Procedures / Treatments   Labs (all labs ordered are listed, but only abnormal results are displayed) Labs Reviewed  URINALYSIS, ROUTINE W REFLEX MICROSCOPIC  BASIC METABOLIC PANEL  CBC WITH DIFFERENTIAL/PLATELET  I-STAT BETA HCG BLOOD, ED ([facility], [facility], [facility] ONLY)    EKG None  Radiology No results found.  Procedures Procedures    Medications Ordered in ED Medications  morphine (PF) 4 MG/ML injection 4 mg (has no administration in time range)  ondansetron (ZOFRAN) injection 4 mg (has no administration in time range)    ED Course/ Medical Decision Making/ A&P  Patient is a 32 year old female with history of spina bifida, end-stage renal disease on hemodialysis and frequent UTIs.  Patient presenting today with complaints of left sided abdominal and flank pain.  This seems to be a recurrent problem for her as she has had recurrent UTIs in the past.  On exam, patient does have tenderness to the left side of the abdomen and flank.  Vitals are stable with no fever.  Workup initiated including CBC and basic metabolic panel.  CBC reveals baseline anemia, but no leukocytosis or other abnormality.  Pregnancy test negative.    CT with renal protocol reveals no acute abnormality.  Patient given morphine for  pain and Zofran for nausea.  She is now resting comfortably.  Patient to be treated for presumed UTI.  She is to follow-up and return if symptoms worsen or change.  Final Clinical Impression(s) / ED Diagnoses Final diagnoses:  None    Rx / DC Orders ED Discharge Orders     None         Geoffery Lyons, MD 11/26/22 [phone]

## 2022-11-26 NOTE — ED Notes (Signed)
Pt continues to remove BP cuff 

## 2022-11-27 DIAGNOSIS — Z992 Dependence on renal dialysis: Secondary | ICD-10-CM | POA: Diagnosis not present

## 2022-11-27 DIAGNOSIS — N186 End stage renal disease: Secondary | ICD-10-CM | POA: Diagnosis not present

## 2022-11-27 DIAGNOSIS — N2581 Secondary hyperparathyroidism of renal origin: Secondary | ICD-10-CM | POA: Diagnosis not present

## 2022-11-29 DIAGNOSIS — Z992 Dependence on renal dialysis: Secondary | ICD-10-CM | POA: Diagnosis not present

## 2022-11-29 DIAGNOSIS — N2581 Secondary hyperparathyroidism of renal origin: Secondary | ICD-10-CM | POA: Diagnosis not present

## 2022-11-29 DIAGNOSIS — N186 End stage renal disease: Secondary | ICD-10-CM | POA: Diagnosis not present

## 2022-12-02 DIAGNOSIS — N2581 Secondary hyperparathyroidism of renal origin: Secondary | ICD-10-CM | POA: Diagnosis not present

## 2022-12-02 DIAGNOSIS — N186 End stage renal disease: Secondary | ICD-10-CM | POA: Diagnosis not present

## 2022-12-02 DIAGNOSIS — Z992 Dependence on renal dialysis: Secondary | ICD-10-CM | POA: Diagnosis not present

## 2022-12-05 DIAGNOSIS — N2581 Secondary hyperparathyroidism of renal origin: Secondary | ICD-10-CM | POA: Diagnosis not present

## 2022-12-05 DIAGNOSIS — N186 End stage renal disease: Secondary | ICD-10-CM | POA: Diagnosis not present

## 2022-12-05 DIAGNOSIS — T7840XA Allergy, unspecified, initial encounter: Secondary | ICD-10-CM | POA: Diagnosis not present

## 2022-12-05 DIAGNOSIS — Z992 Dependence on renal dialysis: Secondary | ICD-10-CM | POA: Diagnosis not present

## 2022-12-05 DIAGNOSIS — D631 Anemia in chronic kidney disease: Secondary | ICD-10-CM | POA: Diagnosis not present

## 2022-12-08 DIAGNOSIS — Z992 Dependence on renal dialysis: Secondary | ICD-10-CM | POA: Diagnosis not present

## 2022-12-08 DIAGNOSIS — N186 End stage renal disease: Secondary | ICD-10-CM | POA: Diagnosis not present

## 2022-12-08 DIAGNOSIS — D631 Anemia in chronic kidney disease: Secondary | ICD-10-CM | POA: Diagnosis not present

## 2022-12-08 DIAGNOSIS — N2581 Secondary hyperparathyroidism of renal origin: Secondary | ICD-10-CM | POA: Diagnosis not present

## 2022-12-08 DIAGNOSIS — T7840XA Allergy, unspecified, initial encounter: Secondary | ICD-10-CM | POA: Diagnosis not present

## 2022-12-10 DIAGNOSIS — Z992 Dependence on renal dialysis: Secondary | ICD-10-CM | POA: Diagnosis not present

## 2022-12-10 DIAGNOSIS — T7840XA Allergy, unspecified, initial encounter: Secondary | ICD-10-CM | POA: Diagnosis not present

## 2022-12-10 DIAGNOSIS — D631 Anemia in chronic kidney disease: Secondary | ICD-10-CM | POA: Diagnosis not present

## 2022-12-10 DIAGNOSIS — N2581 Secondary hyperparathyroidism of renal origin: Secondary | ICD-10-CM | POA: Diagnosis not present

## 2022-12-10 DIAGNOSIS — N186 End stage renal disease: Secondary | ICD-10-CM | POA: Diagnosis not present

## 2022-12-12 DIAGNOSIS — Z992 Dependence on renal dialysis: Secondary | ICD-10-CM | POA: Diagnosis not present

## 2022-12-12 DIAGNOSIS — N186 End stage renal disease: Secondary | ICD-10-CM | POA: Diagnosis not present

## 2022-12-12 DIAGNOSIS — D631 Anemia in chronic kidney disease: Secondary | ICD-10-CM | POA: Diagnosis not present

## 2022-12-12 DIAGNOSIS — N2581 Secondary hyperparathyroidism of renal origin: Secondary | ICD-10-CM | POA: Diagnosis not present

## 2022-12-12 DIAGNOSIS — T7840XA Allergy, unspecified, initial encounter: Secondary | ICD-10-CM | POA: Diagnosis not present

## 2022-12-15 DIAGNOSIS — N186 End stage renal disease: Secondary | ICD-10-CM | POA: Diagnosis not present

## 2022-12-15 DIAGNOSIS — Z992 Dependence on renal dialysis: Secondary | ICD-10-CM | POA: Diagnosis not present

## 2022-12-15 DIAGNOSIS — N2581 Secondary hyperparathyroidism of renal origin: Secondary | ICD-10-CM | POA: Diagnosis not present

## 2022-12-15 DIAGNOSIS — D631 Anemia in chronic kidney disease: Secondary | ICD-10-CM | POA: Diagnosis not present

## 2022-12-15 DIAGNOSIS — T7840XA Allergy, unspecified, initial encounter: Secondary | ICD-10-CM | POA: Diagnosis not present

## 2022-12-17 DIAGNOSIS — Z992 Dependence on renal dialysis: Secondary | ICD-10-CM | POA: Diagnosis not present

## 2022-12-17 DIAGNOSIS — T7840XA Allergy, unspecified, initial encounter: Secondary | ICD-10-CM | POA: Diagnosis not present

## 2022-12-17 DIAGNOSIS — N2581 Secondary hyperparathyroidism of renal origin: Secondary | ICD-10-CM | POA: Diagnosis not present

## 2022-12-17 DIAGNOSIS — N186 End stage renal disease: Secondary | ICD-10-CM | POA: Diagnosis not present

## 2022-12-17 DIAGNOSIS — D631 Anemia in chronic kidney disease: Secondary | ICD-10-CM | POA: Diagnosis not present

## 2022-12-19 DIAGNOSIS — Z992 Dependence on renal dialysis: Secondary | ICD-10-CM | POA: Diagnosis not present

## 2022-12-19 DIAGNOSIS — N2581 Secondary hyperparathyroidism of renal origin: Secondary | ICD-10-CM | POA: Diagnosis not present

## 2022-12-19 DIAGNOSIS — N186 End stage renal disease: Secondary | ICD-10-CM | POA: Diagnosis not present

## 2022-12-19 DIAGNOSIS — T7840XA Allergy, unspecified, initial encounter: Secondary | ICD-10-CM | POA: Diagnosis not present

## 2022-12-19 DIAGNOSIS — D631 Anemia in chronic kidney disease: Secondary | ICD-10-CM | POA: Diagnosis not present

## 2022-12-22 DIAGNOSIS — N186 End stage renal disease: Secondary | ICD-10-CM | POA: Diagnosis not present

## 2022-12-22 DIAGNOSIS — N2581 Secondary hyperparathyroidism of renal origin: Secondary | ICD-10-CM | POA: Diagnosis not present

## 2022-12-22 DIAGNOSIS — D631 Anemia in chronic kidney disease: Secondary | ICD-10-CM | POA: Diagnosis not present

## 2022-12-22 DIAGNOSIS — Z992 Dependence on renal dialysis: Secondary | ICD-10-CM | POA: Diagnosis not present

## 2022-12-22 DIAGNOSIS — T7840XA Allergy, unspecified, initial encounter: Secondary | ICD-10-CM | POA: Diagnosis not present

## 2022-12-24 DIAGNOSIS — D631 Anemia in chronic kidney disease: Secondary | ICD-10-CM | POA: Diagnosis not present

## 2022-12-24 DIAGNOSIS — N186 End stage renal disease: Secondary | ICD-10-CM | POA: Diagnosis not present

## 2022-12-24 DIAGNOSIS — Z992 Dependence on renal dialysis: Secondary | ICD-10-CM | POA: Diagnosis not present

## 2022-12-24 DIAGNOSIS — N2581 Secondary hyperparathyroidism of renal origin: Secondary | ICD-10-CM | POA: Diagnosis not present

## 2022-12-24 DIAGNOSIS — T7840XA Allergy, unspecified, initial encounter: Secondary | ICD-10-CM | POA: Diagnosis not present

## 2022-12-26 DIAGNOSIS — D631 Anemia in chronic kidney disease: Secondary | ICD-10-CM | POA: Diagnosis not present

## 2022-12-26 DIAGNOSIS — N186 End stage renal disease: Secondary | ICD-10-CM | POA: Diagnosis not present

## 2022-12-26 DIAGNOSIS — N2581 Secondary hyperparathyroidism of renal origin: Secondary | ICD-10-CM | POA: Diagnosis not present

## 2022-12-26 DIAGNOSIS — T7840XA Allergy, unspecified, initial encounter: Secondary | ICD-10-CM | POA: Diagnosis not present

## 2022-12-26 DIAGNOSIS — Z992 Dependence on renal dialysis: Secondary | ICD-10-CM | POA: Diagnosis not present

## 2022-12-27 DIAGNOSIS — N186 End stage renal disease: Secondary | ICD-10-CM | POA: Diagnosis not present

## 2022-12-27 DIAGNOSIS — Z992 Dependence on renal dialysis: Secondary | ICD-10-CM | POA: Diagnosis not present

## 2022-12-27 DIAGNOSIS — I120 Hypertensive chronic kidney disease with stage 5 chronic kidney disease or end stage renal disease: Secondary | ICD-10-CM | POA: Diagnosis not present

## 2022-12-29 DIAGNOSIS — N186 End stage renal disease: Secondary | ICD-10-CM | POA: Diagnosis not present

## 2022-12-29 DIAGNOSIS — E8779 Other fluid overload: Secondary | ICD-10-CM | POA: Diagnosis not present

## 2022-12-29 DIAGNOSIS — D631 Anemia in chronic kidney disease: Secondary | ICD-10-CM | POA: Diagnosis not present

## 2022-12-29 DIAGNOSIS — Z992 Dependence on renal dialysis: Secondary | ICD-10-CM | POA: Diagnosis not present

## 2022-12-29 DIAGNOSIS — N2581 Secondary hyperparathyroidism of renal origin: Secondary | ICD-10-CM | POA: Diagnosis not present

## 2022-12-31 DIAGNOSIS — N186 End stage renal disease: Secondary | ICD-10-CM | POA: Diagnosis not present

## 2022-12-31 DIAGNOSIS — E8779 Other fluid overload: Secondary | ICD-10-CM | POA: Diagnosis not present

## 2022-12-31 DIAGNOSIS — Z992 Dependence on renal dialysis: Secondary | ICD-10-CM | POA: Diagnosis not present

## 2022-12-31 DIAGNOSIS — D631 Anemia in chronic kidney disease: Secondary | ICD-10-CM | POA: Diagnosis not present

## 2022-12-31 DIAGNOSIS — N2581 Secondary hyperparathyroidism of renal origin: Secondary | ICD-10-CM | POA: Diagnosis not present

## 2023-01-02 DIAGNOSIS — N2581 Secondary hyperparathyroidism of renal origin: Secondary | ICD-10-CM | POA: Diagnosis not present

## 2023-01-02 DIAGNOSIS — D631 Anemia in chronic kidney disease: Secondary | ICD-10-CM | POA: Diagnosis not present

## 2023-01-02 DIAGNOSIS — N186 End stage renal disease: Secondary | ICD-10-CM | POA: Diagnosis not present

## 2023-01-02 DIAGNOSIS — E8779 Other fluid overload: Secondary | ICD-10-CM | POA: Diagnosis not present

## 2023-01-02 DIAGNOSIS — Z992 Dependence on renal dialysis: Secondary | ICD-10-CM | POA: Diagnosis not present

## 2023-01-05 DIAGNOSIS — Z992 Dependence on renal dialysis: Secondary | ICD-10-CM | POA: Diagnosis not present

## 2023-01-05 DIAGNOSIS — E8779 Other fluid overload: Secondary | ICD-10-CM | POA: Diagnosis not present

## 2023-01-05 DIAGNOSIS — N186 End stage renal disease: Secondary | ICD-10-CM | POA: Diagnosis not present

## 2023-01-05 DIAGNOSIS — D631 Anemia in chronic kidney disease: Secondary | ICD-10-CM | POA: Diagnosis not present

## 2023-01-05 DIAGNOSIS — N2581 Secondary hyperparathyroidism of renal origin: Secondary | ICD-10-CM | POA: Diagnosis not present

## 2023-01-07 DIAGNOSIS — N186 End stage renal disease: Secondary | ICD-10-CM | POA: Diagnosis not present

## 2023-01-07 DIAGNOSIS — Z992 Dependence on renal dialysis: Secondary | ICD-10-CM | POA: Diagnosis not present

## 2023-01-07 DIAGNOSIS — N2581 Secondary hyperparathyroidism of renal origin: Secondary | ICD-10-CM | POA: Diagnosis not present

## 2023-01-07 DIAGNOSIS — D631 Anemia in chronic kidney disease: Secondary | ICD-10-CM | POA: Diagnosis not present

## 2023-01-07 DIAGNOSIS — E8779 Other fluid overload: Secondary | ICD-10-CM | POA: Diagnosis not present

## 2023-01-09 DIAGNOSIS — D631 Anemia in chronic kidney disease: Secondary | ICD-10-CM | POA: Diagnosis not present

## 2023-01-09 DIAGNOSIS — N2581 Secondary hyperparathyroidism of renal origin: Secondary | ICD-10-CM | POA: Diagnosis not present

## 2023-01-09 DIAGNOSIS — E8779 Other fluid overload: Secondary | ICD-10-CM | POA: Diagnosis not present

## 2023-01-09 DIAGNOSIS — Z992 Dependence on renal dialysis: Secondary | ICD-10-CM | POA: Diagnosis not present

## 2023-01-09 DIAGNOSIS — N186 End stage renal disease: Secondary | ICD-10-CM | POA: Diagnosis not present

## 2023-01-12 DIAGNOSIS — D631 Anemia in chronic kidney disease: Secondary | ICD-10-CM | POA: Diagnosis not present

## 2023-01-12 DIAGNOSIS — Z992 Dependence on renal dialysis: Secondary | ICD-10-CM | POA: Diagnosis not present

## 2023-01-12 DIAGNOSIS — N186 End stage renal disease: Secondary | ICD-10-CM | POA: Diagnosis not present

## 2023-01-12 DIAGNOSIS — E8779 Other fluid overload: Secondary | ICD-10-CM | POA: Diagnosis not present

## 2023-01-12 DIAGNOSIS — N2581 Secondary hyperparathyroidism of renal origin: Secondary | ICD-10-CM | POA: Diagnosis not present

## 2023-01-14 DIAGNOSIS — E8779 Other fluid overload: Secondary | ICD-10-CM | POA: Diagnosis not present

## 2023-01-14 DIAGNOSIS — N2581 Secondary hyperparathyroidism of renal origin: Secondary | ICD-10-CM | POA: Diagnosis not present

## 2023-01-14 DIAGNOSIS — Z992 Dependence on renal dialysis: Secondary | ICD-10-CM | POA: Diagnosis not present

## 2023-01-14 DIAGNOSIS — D631 Anemia in chronic kidney disease: Secondary | ICD-10-CM | POA: Diagnosis not present

## 2023-01-14 DIAGNOSIS — N186 End stage renal disease: Secondary | ICD-10-CM | POA: Diagnosis not present

## 2023-01-15 ENCOUNTER — Ambulatory Visit
Admission: RE | Admit: 2023-01-15 | Discharge: 2023-01-15 | Disposition: A | Discharge status: Home / Self Care | Payer: Medicare Other | Source: Ambulatory Visit | Attending: Nephrology | Admitting: Nephrology

## 2023-01-15 ENCOUNTER — Other Ambulatory Visit: Payer: Self-pay | Admitting: Nephrology

## 2023-01-15 DIAGNOSIS — R042 Hemoptysis: Secondary | ICD-10-CM

## 2023-01-15 DIAGNOSIS — I517 Cardiomegaly: Secondary | ICD-10-CM | POA: Diagnosis not present

## 2023-01-16 DIAGNOSIS — N2581 Secondary hyperparathyroidism of renal origin: Secondary | ICD-10-CM | POA: Diagnosis not present

## 2023-01-16 DIAGNOSIS — E8779 Other fluid overload: Secondary | ICD-10-CM | POA: Diagnosis not present

## 2023-01-16 DIAGNOSIS — N186 End stage renal disease: Secondary | ICD-10-CM | POA: Diagnosis not present

## 2023-01-16 DIAGNOSIS — Z992 Dependence on renal dialysis: Secondary | ICD-10-CM | POA: Diagnosis not present

## 2023-01-16 DIAGNOSIS — D631 Anemia in chronic kidney disease: Secondary | ICD-10-CM | POA: Diagnosis not present

## 2023-01-19 DIAGNOSIS — D631 Anemia in chronic kidney disease: Secondary | ICD-10-CM | POA: Diagnosis not present

## 2023-01-19 DIAGNOSIS — E8779 Other fluid overload: Secondary | ICD-10-CM | POA: Diagnosis not present

## 2023-01-19 DIAGNOSIS — N186 End stage renal disease: Secondary | ICD-10-CM | POA: Diagnosis not present

## 2023-01-19 DIAGNOSIS — Z992 Dependence on renal dialysis: Secondary | ICD-10-CM | POA: Diagnosis not present

## 2023-01-19 DIAGNOSIS — N2581 Secondary hyperparathyroidism of renal origin: Secondary | ICD-10-CM | POA: Diagnosis not present

## 2023-01-21 DIAGNOSIS — N186 End stage renal disease: Secondary | ICD-10-CM | POA: Diagnosis not present

## 2023-01-21 DIAGNOSIS — D631 Anemia in chronic kidney disease: Secondary | ICD-10-CM | POA: Diagnosis not present

## 2023-01-21 DIAGNOSIS — Z992 Dependence on renal dialysis: Secondary | ICD-10-CM | POA: Diagnosis not present

## 2023-01-21 DIAGNOSIS — N2581 Secondary hyperparathyroidism of renal origin: Secondary | ICD-10-CM | POA: Diagnosis not present

## 2023-01-21 DIAGNOSIS — E8779 Other fluid overload: Secondary | ICD-10-CM | POA: Diagnosis not present

## 2023-01-22 DIAGNOSIS — N186 End stage renal disease: Secondary | ICD-10-CM | POA: Diagnosis not present

## 2023-01-22 DIAGNOSIS — N2581 Secondary hyperparathyroidism of renal origin: Secondary | ICD-10-CM | POA: Diagnosis not present

## 2023-01-22 DIAGNOSIS — E8779 Other fluid overload: Secondary | ICD-10-CM | POA: Diagnosis not present

## 2023-01-22 DIAGNOSIS — Z992 Dependence on renal dialysis: Secondary | ICD-10-CM | POA: Diagnosis not present

## 2023-01-22 DIAGNOSIS — D631 Anemia in chronic kidney disease: Secondary | ICD-10-CM | POA: Diagnosis not present

## 2023-01-23 DIAGNOSIS — D631 Anemia in chronic kidney disease: Secondary | ICD-10-CM | POA: Diagnosis not present

## 2023-01-23 DIAGNOSIS — E8779 Other fluid overload: Secondary | ICD-10-CM | POA: Diagnosis not present

## 2023-01-23 DIAGNOSIS — Z992 Dependence on renal dialysis: Secondary | ICD-10-CM | POA: Diagnosis not present

## 2023-01-23 DIAGNOSIS — N2581 Secondary hyperparathyroidism of renal origin: Secondary | ICD-10-CM | POA: Diagnosis not present

## 2023-01-23 DIAGNOSIS — N186 End stage renal disease: Secondary | ICD-10-CM | POA: Diagnosis not present

## 2023-01-26 DIAGNOSIS — Z992 Dependence on renal dialysis: Secondary | ICD-10-CM | POA: Diagnosis not present

## 2023-01-26 DIAGNOSIS — I120 Hypertensive chronic kidney disease with stage 5 chronic kidney disease or end stage renal disease: Secondary | ICD-10-CM | POA: Diagnosis not present

## 2023-01-26 DIAGNOSIS — E8770 Fluid overload, unspecified: Secondary | ICD-10-CM | POA: Diagnosis not present

## 2023-01-26 DIAGNOSIS — N186 End stage renal disease: Secondary | ICD-10-CM | POA: Diagnosis not present

## 2023-01-26 DIAGNOSIS — N2581 Secondary hyperparathyroidism of renal origin: Secondary | ICD-10-CM | POA: Diagnosis not present

## 2023-01-26 DIAGNOSIS — D631 Anemia in chronic kidney disease: Secondary | ICD-10-CM | POA: Diagnosis not present

## 2023-01-26 DIAGNOSIS — E162 Hypoglycemia, unspecified: Secondary | ICD-10-CM | POA: Diagnosis not present

## 2023-01-27 NOTE — Progress Notes (Deleted)
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  History supplemented by referring provider's note.  CTs of head from 2023, 2021 and 2019 personally reviewed.  Onset:  *** Location:  *** Quality:  *** Intensity:  ***.  *** denies new headache, thunderclap headache or severe headache that wakes *** from sleep. Aura:  *** Prodrome:  *** Postdrome:  *** Associated symptoms:  ***.  *** denies associated unilateral numbness or weakness. Duration:  *** Frequency:  *** Frequency of abortive medication: *** Triggers:  *** Relieving factors:  *** Activity:  ***  Past workup include several head CTs (04/28/2022, 02/08/2022, 04/29/2020, 05/07/2018, 07/23/2016) which were unremarkable  Past NSAIDS/analgesics:  tramadol Past abortive triptans:  *** Past abortive ergotamine:  none Past muscle relaxants:  *** Past anti-emetic:  *** Past antihypertensive medications:  amlodipine  Past antidepressant medications:  sertraline Past anticonvulsant medications:  *** Past anti-CGRP:  none Past vitamins/Herbal/Supplements:  none Past antihistamines/decongestants:  none Other past therapies:  ***  Current NSAIDS/analgesics:  Tylenol Current triptans:  none Current ergotamine:  none Current anti-emetic:  Zofran-ODT 4mg  Current muscle relaxants:  none Current Antihypertensive medications:  metoprolol succinate 100mg  daily, hydralazine Current Antidepressant medications:  none Current Anticonvulsant medications:  none Current anti-CGRP:  none Current Antihistamines/Decongestants:  Benadryl Other therapy:  *** Birth control:  *** Other  medications:  Auryxia, albuterol PRN   Caffeine:  *** Alcohol:  *** Smoker:  *** Diet:  *** Exercise:  *** Depression:  ***; Anxiety:  *** Other pain:  *** Sleep hygiene:  *** Family history of headache:  ***      PAST MEDICAL HISTORY: Past Medical History:  Diagnosis Date   Anemia associated with chronic renal failure    Blood transfusion    Caudal regression syndrome    Assoc with spina bifida.   Depression 05/14/2015   Dialysis care    ESRD (end stage renal disease) on dialysis [facility])    GERD (gastroesophageal reflux disease) 01/07/2017   HTN (hypertension) 05/02/2011   Murmur, cardiac 07/17/2022   Spina bifida    UTI (lower urinary tract infection)    Viral URI with cough 09/15/2022    PAST SURGICAL HISTORY: Past Surgical History:  Procedure Laterality Date   A/V FISTULAGRAM Left 10/14/2021   Procedure: A/V Fistulagram;  Surgeon: Maeola Harman, MD;  Location: [facility] INVASIVE CV LAB;  Service: Cardiovascular;  Laterality: Left;   AV FISTULA PLACEMENT     left arm   INSERTION OF DIALYSIS CATHETER N/A 05/08/2020   Procedure: ATTEMPTED, UNSUCCESSFUL INSERTION OF DIALYSIS CATHETER TUNNELED RIGHT INTERNAL JUGULAR;  Surgeon: Lucretia Roers, MD;  Location: [facility] ORS;  Service: General;  Laterality: N/A;   IR FLUORO GUIDE CV LINE RIGHT  05/09/2020   IR US GUIDE VASC ACCESS RIGHT  05/09/2020    MEDICATIONS: Current Outpatient Medications on File Prior to Visit  Medication Sig Dispense Refill   acetaminophen (TYLENOL) 500 MG tablet Take 1,000 mg by mouth every 6 (six) hours as needed for mild pain or headache.     albuterol (VENTOLIN HFA) 108 (90 Base) MCG/ACT inhaler Inhale 1-2 puffs into the lungs every 6 (six) hours as needed  for wheezing or shortness of breath. 1 each 0   amoxicillin-clavulanate (AUGMENTIN) 875-125 MG tablet Take 1 tablet by mouth every 12 (twelve) hours. (Patient not taking: Reported on 11/17/2022) 14 tablet 0   AURYXIA 1 GM 210 MG(Fe) tablet  Take 630 mg by mouth 3 (three) times daily with meals.  3   B Complex-C-Folic Acid (DIALYVITE TABLET) TABS Take 1 tablet by mouth daily.     diphenhydrAMINE (BENADRYL) 25 mg capsule Take 25 mg by mouth daily as needed for allergies.     Epoetin Alfa (EPOGEN IJ) Inject as directed See admin instructions. Every Tuesday, Thursday, Saturday     hydrALAZINE (APRESOLINE) 25 MG tablet Take 25 mg by mouth 3 (three) times daily.     Menthol-Zinc Oxide (CALMOSEPTINE) 0.44-20.6 % OINT Apply 1 Application topically daily. 71 g 1   Methoxy PEG-Epoetin Beta (MIRCERA IJ) Mircera     metoprolol succinate (TOPROL-XL) 100 MG 24 hr tablet Take 100 mg by mouth daily.     omeprazole (PRILOSEC) 40 MG capsule Take 40 mg by mouth every morning.     ondansetron (ZOFRAN) 4 MG tablet Take 1 tablet (4 mg total) by mouth every 6 (six) hours as needed for nausea or vomiting. (Patient not taking: Reported on 11/17/2022) 12 tablet 0   ondansetron (ZOFRAN-ODT) 4 MG disintegrating tablet Take 1 tablet (4 mg total) by mouth every 8 (eight) hours as needed. 20 tablet 0   sodium zirconium cyclosilicate (LOKELMA) 5 g packet Take 10 g by mouth every Monday, Wednesday, and Friday.     No current facility-administered medications on file prior to visit.    ALLERGIES: Allergies  Allergen Reactions   Ciprofloxacin Shortness Of Breath, Nausea And Vomiting and Other (See Comments)    HIGH FEVER and oral blisters    Other Anaphylaxis    Revaclear dialzer   Peanut-Containing Drug Products Anaphylaxis   Aleve [Naproxen Sodium] Other (See Comments)    G.I.Bleed   Ceftriaxone Other (See Comments)    Blisters in mouth    Coconut (Cocos Nucifera) Hives   Influenza Vaccines Nausea And Vomiting and Other (See Comments)    High fever   Tetanus Toxoid, Adsorbed Nausea And Vomiting and Other (See Comments)    HIGH FEVER, also   Tetanus Toxoids Nausea And Vomiting and Other (See Comments)    HIGH FEVER   Latex Itching and Rash     FAMILY HISTORY: Family History  Problem Relation Age of Onset   Kidney cancer Other    Hypertension Maternal Grandmother    Arthritis Maternal Grandmother    Breast cancer Maternal Aunt    Colon cancer Neg Hx     Objective:  *** General: No acute distress.  Patient appears well-groomed.   Head:  Normocephalic/atraumatic Eyes:  fundi examined but not visualized Neck: supple, no paraspinal tenderness, full range of motion Back: No paraspinal tenderness Heart: regular rate and rhythm Lungs: Clear to auscultation bilaterally. Vascular: No carotid bruits. Neurological Exam: Mental status: alert and oriented to person, place, and time, speech fluent and not dysarthric, language intact. Cranial nerves: CN I: not tested CN II: pupils equal, round and reactive to light, visual fields intact CN III, IV, VI:  full range of motion, no nystagmus, no ptosis CN V: facial sensation intact. CN VII: upper and lower face symmetric CN VIII: hearing intact CN IX, X: gag intact, uvula midline CN XI: sternocleidomastoid and trapezius muscles intact CN XII: tongue midline Bulk & Tone: normal, no fasciculations. Motor:  muscle strength 5/5 throughout Sensation:  Pinprick, temperature and vibratory sensation intact. Deep Tendon Reflexes:  2+ throughout,  toes downgoing.   Finger to nose testing:  Without dysmetria.   Heel to shin:  Without dysmetria.   Gait:  Normal station and stride.  Romberg negative.    Thank you for allowing me to take part in the care of this patient.  Metta Clines, DO  CC: ***

## 2023-01-28 ENCOUNTER — Ambulatory Visit: Payer: Medicare Other | Admitting: Neurology

## 2023-01-28 DIAGNOSIS — E8770 Fluid overload, unspecified: Secondary | ICD-10-CM | POA: Diagnosis not present

## 2023-01-28 DIAGNOSIS — E162 Hypoglycemia, unspecified: Secondary | ICD-10-CM | POA: Diagnosis not present

## 2023-01-28 DIAGNOSIS — Z992 Dependence on renal dialysis: Secondary | ICD-10-CM | POA: Diagnosis not present

## 2023-01-28 DIAGNOSIS — N2581 Secondary hyperparathyroidism of renal origin: Secondary | ICD-10-CM | POA: Diagnosis not present

## 2023-01-28 DIAGNOSIS — N186 End stage renal disease: Secondary | ICD-10-CM | POA: Diagnosis not present

## 2023-01-28 DIAGNOSIS — D631 Anemia in chronic kidney disease: Secondary | ICD-10-CM | POA: Diagnosis not present

## 2023-01-30 DIAGNOSIS — N186 End stage renal disease: Secondary | ICD-10-CM | POA: Diagnosis not present

## 2023-01-30 DIAGNOSIS — D631 Anemia in chronic kidney disease: Secondary | ICD-10-CM | POA: Diagnosis not present

## 2023-01-30 DIAGNOSIS — E162 Hypoglycemia, unspecified: Secondary | ICD-10-CM | POA: Diagnosis not present

## 2023-01-30 DIAGNOSIS — E8770 Fluid overload, unspecified: Secondary | ICD-10-CM | POA: Diagnosis not present

## 2023-01-30 DIAGNOSIS — N2581 Secondary hyperparathyroidism of renal origin: Secondary | ICD-10-CM | POA: Diagnosis not present

## 2023-01-30 DIAGNOSIS — Z992 Dependence on renal dialysis: Secondary | ICD-10-CM | POA: Diagnosis not present

## 2023-02-02 DIAGNOSIS — D631 Anemia in chronic kidney disease: Secondary | ICD-10-CM | POA: Diagnosis not present

## 2023-02-02 DIAGNOSIS — Z992 Dependence on renal dialysis: Secondary | ICD-10-CM | POA: Diagnosis not present

## 2023-02-02 DIAGNOSIS — E8770 Fluid overload, unspecified: Secondary | ICD-10-CM | POA: Diagnosis not present

## 2023-02-02 DIAGNOSIS — N186 End stage renal disease: Secondary | ICD-10-CM | POA: Diagnosis not present

## 2023-02-02 DIAGNOSIS — E162 Hypoglycemia, unspecified: Secondary | ICD-10-CM | POA: Diagnosis not present

## 2023-02-02 DIAGNOSIS — N2581 Secondary hyperparathyroidism of renal origin: Secondary | ICD-10-CM | POA: Diagnosis not present

## 2023-02-03 ENCOUNTER — Ambulatory Visit (INDEPENDENT_AMBULATORY_CARE_PROVIDER_SITE_OTHER): Payer: Medicare Other | Admitting: Family Medicine

## 2023-02-03 ENCOUNTER — Encounter: Payer: Self-pay | Admitting: Family Medicine

## 2023-02-03 VITALS — BP 140/90 | HR 74 | Temp 97.9°F | Ht <= 58 in

## 2023-02-03 DIAGNOSIS — J069 Acute upper respiratory infection, unspecified: Secondary | ICD-10-CM

## 2023-02-03 NOTE — Assessment & Plan Note (Signed)
Reassured patient that symptoms and exam findings are most consistent with a viral upper respiratory infection and explained lack of efficacy of antibiotics against viruses.  Discussed expected course and features suggestive of secondary bacterial infection.  Continue supportive care. Increase fluid intake with water or electrolyte solution like pedialyte. Encouraged acetaminophen as needed for fever/pain. Encouraged salt water gargling, chloraseptic spray and throat lozenges. Encouraged OTC guaifenesin. Encouraged saline sinus flushes and/or neti with humidified air.   

## 2023-02-03 NOTE — Progress Notes (Signed)
Subjective:  HPI: [name] is a 32 y.o. female presenting on 02/03/2023 for Follow-up (F/u cold symptoms)   HPI Patient is in today for 4 days coughing and clear runny nose that is overall improving. She does report ear pain. Denies fever, shortness of breath, chest pain, sinus pressure, sore throat. Has tried Tylenol. Symptoms overall improving.   Review of Systems  All other systems reviewed and are negative.   Relevant past medical history reviewed and updated as indicated.   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   Depression 05/14/2015   Dialysis care    ESRD (end stage renal disease) on dialysis [facility])    GERD (gastroesophageal reflux disease) 01/07/2017   HTN (hypertension) 05/02/2011   Murmur, cardiac 07/17/2022   Spina bifida    UTI (lower urinary tract infection)    Viral URI with cough 09/15/2022     Past Surgical History:  Procedure Laterality Date   A/V FISTULAGRAM Left 10/14/2021   Procedure: A/V Fistulagram;  Surgeon: Maeola Harman, MD;  Location: [facility] INVASIVE CV LAB;  Service: Cardiovascular;  Laterality: Left;   AV FISTULA PLACEMENT     left arm   INSERTION OF DIALYSIS CATHETER N/A 05/08/2020   Procedure: ATTEMPTED, UNSUCCESSFUL INSERTION OF DIALYSIS CATHETER TUNNELED RIGHT INTERNAL JUGULAR;  Surgeon: Lucretia Roers, MD;  Location: [facility] ORS;  Service: General;  Laterality: N/A;   IR FLUORO GUIDE CV LINE RIGHT  05/09/2020   IR US GUIDE VASC ACCESS RIGHT  05/09/2020    Allergies and medications reviewed and updated.   Current Outpatient Medications:    acetaminophen (TYLENOL) 500 MG tablet, Take 1,000 mg by mouth every 6 (six) hours as needed for mild pain or headache., Disp: , Rfl:    albuterol (VENTOLIN HFA) 108 (90 Base) MCG/ACT inhaler, Inhale 1-2 puffs into the lungs every 6 (six) hours as needed for  wheezing or shortness of breath., Disp: 1 each, Rfl: 0   AURYXIA 1 GM 210 MG(Fe) tablet, Take 630 mg by mouth 3 (three) times daily with meals., Disp: , Rfl: 3   B Complex-C-Folic Acid (DIALYVITE TABLET) TABS, Take 1 tablet by mouth daily., Disp: , Rfl:    diphenhydrAMINE (BENADRYL) 25 mg capsule, Take 25 mg by mouth daily as needed for allergies., Disp: , Rfl:    Epoetin Alfa (EPOGEN IJ), Inject as directed See admin instructions. Every Tuesday, Thursday, Saturday, Disp: , Rfl:    hydrALAZINE (APRESOLINE) 25 MG tablet, Take 25 mg by mouth 3 (three) times daily., Disp: , Rfl:    Menthol-Zinc Oxide (CALMOSEPTINE) 0.44-20.6 % OINT, Apply 1 Application topically daily., Disp: 71 g, Rfl: 1   Methoxy PEG-Epoetin Beta (MIRCERA IJ), Mircera, Disp: , Rfl:    metoprolol succinate (TOPROL-XL) 100 MG 24 hr tablet, Take 100 mg by mouth daily., Disp: , Rfl:    omeprazole (PRILOSEC) 40 MG capsule, Take 40 mg by mouth every morning., Disp: , Rfl:    ondansetron (ZOFRAN) 4 MG tablet, Take 1 tablet (4 mg total) by mouth every 6 (six) hours as needed for nausea or vomiting., Disp: 12 tablet, Rfl: 0   ondansetron (ZOFRAN-ODT) 4 MG disintegrating tablet, Take 1 tablet (4 mg total) by mouth every 8 (eight) hours as needed., Disp: 20 tablet, Rfl: 0   sodium zirconium cyclosilicate (LOKELMA) 5 g packet, Take 10 g by  mouth every Monday, Wednesday, and Friday., Disp: , Rfl:    amoxicillin-clavulanate (AUGMENTIN) 875-125 MG tablet, Take 1 tablet by mouth every 12 (twelve) hours. (Patient not taking: Reported on 11/17/2022), Disp: 14 tablet, Rfl: 0  Allergies  Allergen Reactions   Ciprofloxacin Shortness Of Breath, Nausea And Vomiting and Other (See Comments)    HIGH FEVER and oral blisters    Other Anaphylaxis    Revaclear dialzer   Peanut-Containing Drug Products Anaphylaxis   Aleve [Naproxen Sodium] Other (See Comments)    G.I.Bleed   Ceftriaxone Other (See Comments)    Blisters in mouth    Coconut (Cocos  Nucifera) Hives   Influenza Vaccines Nausea And Vomiting and Other (See Comments)    High fever   Tetanus Toxoid, Adsorbed Nausea And Vomiting and Other (See Comments)    HIGH FEVER, also   Tetanus Toxoids Nausea And Vomiting and Other (See Comments)    HIGH FEVER   Latex Itching and Rash    Objective:   BP (!) 140/90   Pulse 74   Temp 97.9 F (36.6 C) (Oral)   Ht 2' (0.61 m)   LMP  (Approximate)   SpO2 99%   BMI 89.11 kg/m      02/03/2023    4:16 PM 11/26/2022    6:00 AM 11/26/2022    5:00 AM  Vitals with BMI  Height 2\' 0"     Systolic 140 140   Diastolic 90 89   Pulse 74 80 81     Physical Exam Vitals and nursing note reviewed.  Constitutional:      Appearance: Normal appearance. She is normal weight.  HENT:     Head: Normocephalic and atraumatic.     Right Ear: Tympanic membrane, ear canal and external ear normal.     Left Ear: Tympanic membrane, ear canal and external ear normal.     Nose: Nose normal.     Mouth/Throat:     Mouth: Mucous membranes are moist.     Pharynx: Oropharynx is clear.  Eyes:     Extraocular Movements: Extraocular movements intact.     Conjunctiva/sclera: Conjunctivae normal.  Cardiovascular:     Rate and Rhythm: Normal rate and regular rhythm.     Pulses: Normal pulses.     Heart sounds: Normal heart sounds.  Pulmonary:     Effort: Pulmonary effort is normal.     Breath sounds: Normal breath sounds.  Musculoskeletal:     Cervical back: No tenderness.  Lymphadenopathy:     Cervical: No cervical adenopathy.  Skin:    General: Skin is warm and dry.  Neurological:     General: No focal deficit present.     Mental Status: She is alert and oriented to person, place, and time. Mental status is at baseline.  Psychiatric:        Mood and Affect: Mood normal.        Behavior: Behavior normal.        Thought Content: Thought content normal.        Judgment: Judgment normal.     Assessment & Plan:  Viral URI with cough Assessment &  Plan: Reassured patient that symptoms and exam findings are most consistent with a viral upper respiratory infection and explained lack of efficacy of antibiotics against viruses.  Discussed expected course and features suggestive of secondary bacterial infection.  Continue supportive care. Increase fluid intake with water or electrolyte solution like pedialyte. Encouraged acetaminophen as needed for fever/pain. Encouraged salt water  gargling, chloraseptic spray and throat lozenges. Encouraged OTC guaifenesin. Encouraged saline sinus flushes and/or neti with humidified air.        Follow up plan: Return if symptoms worsen or fail to improve.  Park Meo, FNP

## 2023-02-03 NOTE — Patient Instructions (Signed)
Your symptoms and exam findings are most consistent with a viral upper respiratory infection. These usually run their course in 5-7 days. Unfortunately, antibiotics don't work against viruses and just increase your risk of other issues such as diarrhea, yeast infections, and resistant infections.  If your symptoms last longer than 10 days and/or you start feeling worse with facial pain, high fever, cough, shortness of breath or start feeling significantly worse, please call us right away to be further evaluated.  Some things that can make you feel better are: - Increased rest - Increasing fluid with water/sugar free electrolytes - Acetaminophen as needed for fever/pain.  - Salt water gargling, chloraseptic spray and throat lozenges - OTC guaifenesin (Mucinex).  - Saline sinus flushes or a neti pot.  - Humidifying the air.  

## 2023-02-04 DIAGNOSIS — N186 End stage renal disease: Secondary | ICD-10-CM | POA: Diagnosis not present

## 2023-02-04 DIAGNOSIS — N2581 Secondary hyperparathyroidism of renal origin: Secondary | ICD-10-CM | POA: Diagnosis not present

## 2023-02-04 DIAGNOSIS — E162 Hypoglycemia, unspecified: Secondary | ICD-10-CM | POA: Diagnosis not present

## 2023-02-04 DIAGNOSIS — Z992 Dependence on renal dialysis: Secondary | ICD-10-CM | POA: Diagnosis not present

## 2023-02-04 DIAGNOSIS — D631 Anemia in chronic kidney disease: Secondary | ICD-10-CM | POA: Diagnosis not present

## 2023-02-04 DIAGNOSIS — E8770 Fluid overload, unspecified: Secondary | ICD-10-CM | POA: Diagnosis not present

## 2023-02-06 DIAGNOSIS — N186 End stage renal disease: Secondary | ICD-10-CM | POA: Diagnosis not present

## 2023-02-06 DIAGNOSIS — Z992 Dependence on renal dialysis: Secondary | ICD-10-CM | POA: Diagnosis not present

## 2023-02-06 DIAGNOSIS — E8770 Fluid overload, unspecified: Secondary | ICD-10-CM | POA: Diagnosis not present

## 2023-02-06 DIAGNOSIS — E162 Hypoglycemia, unspecified: Secondary | ICD-10-CM | POA: Diagnosis not present

## 2023-02-06 DIAGNOSIS — D631 Anemia in chronic kidney disease: Secondary | ICD-10-CM | POA: Diagnosis not present

## 2023-02-06 DIAGNOSIS — N2581 Secondary hyperparathyroidism of renal origin: Secondary | ICD-10-CM | POA: Diagnosis not present

## 2023-02-09 DIAGNOSIS — D631 Anemia in chronic kidney disease: Secondary | ICD-10-CM | POA: Diagnosis not present

## 2023-02-09 DIAGNOSIS — E162 Hypoglycemia, unspecified: Secondary | ICD-10-CM | POA: Diagnosis not present

## 2023-02-09 DIAGNOSIS — N2581 Secondary hyperparathyroidism of renal origin: Secondary | ICD-10-CM | POA: Diagnosis not present

## 2023-02-09 DIAGNOSIS — N186 End stage renal disease: Secondary | ICD-10-CM | POA: Diagnosis not present

## 2023-02-09 DIAGNOSIS — Z992 Dependence on renal dialysis: Secondary | ICD-10-CM | POA: Diagnosis not present

## 2023-02-09 DIAGNOSIS — E8770 Fluid overload, unspecified: Secondary | ICD-10-CM | POA: Diagnosis not present

## 2023-02-11 DIAGNOSIS — N2581 Secondary hyperparathyroidism of renal origin: Secondary | ICD-10-CM | POA: Diagnosis not present

## 2023-02-11 DIAGNOSIS — Z992 Dependence on renal dialysis: Secondary | ICD-10-CM | POA: Diagnosis not present

## 2023-02-11 DIAGNOSIS — E162 Hypoglycemia, unspecified: Secondary | ICD-10-CM | POA: Diagnosis not present

## 2023-02-11 DIAGNOSIS — D631 Anemia in chronic kidney disease: Secondary | ICD-10-CM | POA: Diagnosis not present

## 2023-02-11 DIAGNOSIS — E8770 Fluid overload, unspecified: Secondary | ICD-10-CM | POA: Diagnosis not present

## 2023-02-11 DIAGNOSIS — N186 End stage renal disease: Secondary | ICD-10-CM | POA: Diagnosis not present

## 2023-02-13 DIAGNOSIS — N186 End stage renal disease: Secondary | ICD-10-CM | POA: Diagnosis not present

## 2023-02-13 DIAGNOSIS — Z992 Dependence on renal dialysis: Secondary | ICD-10-CM | POA: Diagnosis not present

## 2023-02-13 DIAGNOSIS — N2581 Secondary hyperparathyroidism of renal origin: Secondary | ICD-10-CM | POA: Diagnosis not present

## 2023-02-13 DIAGNOSIS — E162 Hypoglycemia, unspecified: Secondary | ICD-10-CM | POA: Diagnosis not present

## 2023-02-13 DIAGNOSIS — D631 Anemia in chronic kidney disease: Secondary | ICD-10-CM | POA: Diagnosis not present

## 2023-02-13 DIAGNOSIS — E8770 Fluid overload, unspecified: Secondary | ICD-10-CM | POA: Diagnosis not present

## 2023-02-16 DIAGNOSIS — E8770 Fluid overload, unspecified: Secondary | ICD-10-CM | POA: Diagnosis not present

## 2023-02-16 DIAGNOSIS — Z992 Dependence on renal dialysis: Secondary | ICD-10-CM | POA: Diagnosis not present

## 2023-02-16 DIAGNOSIS — E162 Hypoglycemia, unspecified: Secondary | ICD-10-CM | POA: Diagnosis not present

## 2023-02-16 DIAGNOSIS — N186 End stage renal disease: Secondary | ICD-10-CM | POA: Diagnosis not present

## 2023-02-16 DIAGNOSIS — D631 Anemia in chronic kidney disease: Secondary | ICD-10-CM | POA: Diagnosis not present

## 2023-02-16 DIAGNOSIS — N2581 Secondary hyperparathyroidism of renal origin: Secondary | ICD-10-CM | POA: Diagnosis not present

## 2023-02-17 ENCOUNTER — Telehealth: Payer: Self-pay

## 2023-02-17 ENCOUNTER — Telehealth: Payer: Self-pay | Admitting: Family Medicine

## 2023-02-17 NOTE — Telephone Encounter (Signed)
Msg from Percival Spanish-  Per pt,"She needs a letter from [facility] stating she has spina bifida and is confined to a wheelchair."  Letter needed asap to submit with a grant application for a wheelchair ramp.   Pls advice?

## 2023-02-17 NOTE — Telephone Encounter (Signed)
Received call from Patterson Hammersmith, significant other, to request a letter verifying the patient has spina bifida and is confined to a wheelchair; letter needed to submit with patient's grant application for a wheelchair ramp.  Mr. Bradly Chris stated the ramp they're trying to purchase is used and at a good price; requesting letter asap so they don't miss this opportunity.  Requesting for letter to be sent to patient via email, and for a hard copy to be mailed to the patient's address on file.   Email address confirmed as [email or web address].  Please advise at [phone] with any questions.

## 2023-02-18 DIAGNOSIS — D631 Anemia in chronic kidney disease: Secondary | ICD-10-CM | POA: Diagnosis not present

## 2023-02-18 DIAGNOSIS — N2581 Secondary hyperparathyroidism of renal origin: Secondary | ICD-10-CM | POA: Diagnosis not present

## 2023-02-18 DIAGNOSIS — E8770 Fluid overload, unspecified: Secondary | ICD-10-CM | POA: Diagnosis not present

## 2023-02-18 DIAGNOSIS — E162 Hypoglycemia, unspecified: Secondary | ICD-10-CM | POA: Diagnosis not present

## 2023-02-18 DIAGNOSIS — Z992 Dependence on renal dialysis: Secondary | ICD-10-CM | POA: Diagnosis not present

## 2023-02-18 DIAGNOSIS — N186 End stage renal disease: Secondary | ICD-10-CM | POA: Diagnosis not present

## 2023-02-19 ENCOUNTER — Encounter: Payer: Self-pay | Admitting: Family Medicine

## 2023-02-19 DIAGNOSIS — E162 Hypoglycemia, unspecified: Secondary | ICD-10-CM | POA: Diagnosis not present

## 2023-02-19 DIAGNOSIS — N186 End stage renal disease: Secondary | ICD-10-CM | POA: Diagnosis not present

## 2023-02-19 DIAGNOSIS — D631 Anemia in chronic kidney disease: Secondary | ICD-10-CM | POA: Diagnosis not present

## 2023-02-19 DIAGNOSIS — Z992 Dependence on renal dialysis: Secondary | ICD-10-CM | POA: Diagnosis not present

## 2023-02-19 DIAGNOSIS — E8770 Fluid overload, unspecified: Secondary | ICD-10-CM | POA: Diagnosis not present

## 2023-02-19 DIAGNOSIS — N2581 Secondary hyperparathyroidism of renal origin: Secondary | ICD-10-CM | POA: Diagnosis not present

## 2023-02-20 ENCOUNTER — Emergency Department (HOSPITAL_COMMUNITY)
Admission: EM | Admit: 2023-02-20 | Discharge: 2023-02-20 | Disposition: A | Discharge status: Home / Self Care | Payer: Medicare Other | Attending: Emergency Medicine | Admitting: Emergency Medicine

## 2023-02-20 ENCOUNTER — Encounter (HOSPITAL_COMMUNITY): Payer: Self-pay

## 2023-02-20 ENCOUNTER — Emergency Department (HOSPITAL_COMMUNITY): Payer: Medicare Other

## 2023-02-20 ENCOUNTER — Other Ambulatory Visit: Payer: Self-pay

## 2023-02-20 DIAGNOSIS — R0602 Shortness of breath: Secondary | ICD-10-CM | POA: Diagnosis not present

## 2023-02-20 DIAGNOSIS — E162 Hypoglycemia, unspecified: Secondary | ICD-10-CM | POA: Diagnosis not present

## 2023-02-20 DIAGNOSIS — R609 Edema, unspecified: Secondary | ICD-10-CM | POA: Diagnosis not present

## 2023-02-20 DIAGNOSIS — Z79899 Other long term (current) drug therapy: Secondary | ICD-10-CM | POA: Diagnosis not present

## 2023-02-20 DIAGNOSIS — D631 Anemia in chronic kidney disease: Secondary | ICD-10-CM | POA: Diagnosis not present

## 2023-02-20 DIAGNOSIS — N186 End stage renal disease: Secondary | ICD-10-CM | POA: Insufficient documentation

## 2023-02-20 DIAGNOSIS — I10 Essential (primary) hypertension: Secondary | ICD-10-CM | POA: Diagnosis not present

## 2023-02-20 DIAGNOSIS — Z992 Dependence on renal dialysis: Secondary | ICD-10-CM | POA: Diagnosis not present

## 2023-02-20 DIAGNOSIS — R519 Headache, unspecified: Secondary | ICD-10-CM | POA: Diagnosis not present

## 2023-02-20 DIAGNOSIS — J90 Pleural effusion, not elsewhere classified: Secondary | ICD-10-CM | POA: Diagnosis not present

## 2023-02-20 DIAGNOSIS — I120 Hypertensive chronic kidney disease with stage 5 chronic kidney disease or end stage renal disease: Secondary | ICD-10-CM | POA: Diagnosis not present

## 2023-02-20 DIAGNOSIS — Z91040 Latex allergy status: Secondary | ICD-10-CM | POA: Insufficient documentation

## 2023-02-20 DIAGNOSIS — N2581 Secondary hyperparathyroidism of renal origin: Secondary | ICD-10-CM | POA: Diagnosis not present

## 2023-02-20 DIAGNOSIS — I517 Cardiomegaly: Secondary | ICD-10-CM | POA: Diagnosis not present

## 2023-02-20 DIAGNOSIS — R079 Chest pain, unspecified: Secondary | ICD-10-CM | POA: Diagnosis not present

## 2023-02-20 DIAGNOSIS — R0989 Other specified symptoms and signs involving the circulatory and respiratory systems: Secondary | ICD-10-CM | POA: Diagnosis not present

## 2023-02-20 DIAGNOSIS — Z91010 Allergy to peanuts: Secondary | ICD-10-CM | POA: Diagnosis not present

## 2023-02-20 DIAGNOSIS — R0781 Pleurodynia: Secondary | ICD-10-CM | POA: Diagnosis not present

## 2023-02-20 DIAGNOSIS — E8770 Fluid overload, unspecified: Secondary | ICD-10-CM | POA: Diagnosis not present

## 2023-02-20 DIAGNOSIS — R1084 Generalized abdominal pain: Secondary | ICD-10-CM | POA: Diagnosis not present

## 2023-02-20 DIAGNOSIS — R0789 Other chest pain: Secondary | ICD-10-CM | POA: Diagnosis not present

## 2023-02-20 LAB — CBC WITH DIFFERENTIAL/PLATELET
Abs Immature Granulocytes: 0.03 10*3/uL (ref 0.00–0.07)
Basophils Absolute: 0 10*3/uL (ref 0.0–0.1)
Basophils Relative: 0 %
Eosinophils Absolute: 0.2 10*3/uL (ref 0.0–0.5)
Eosinophils Relative: 3 %
HCT: 34.5 % — ABNORMAL LOW (ref 36.0–46.0)
Hemoglobin: 10.2 g/dL — ABNORMAL LOW (ref 12.0–15.0)
Immature Granulocytes: 0 %
Lymphocytes Relative: 14 %
Lymphs Abs: 1.1 10*3/uL (ref 0.7–4.0)
MCH: 31 pg (ref 26.0–34.0)
MCHC: 29.6 g/dL — ABNORMAL LOW (ref 30.0–36.0)
MCV: 104.9 fL — ABNORMAL HIGH (ref 80.0–100.0)
Monocytes Absolute: 0.6 10*3/uL (ref 0.1–1.0)
Monocytes Relative: 8 %
Neutro Abs: 5.8 10*3/uL (ref 1.7–7.7)
Neutrophils Relative %: 75 %
Platelets: 115 10*3/uL — ABNORMAL LOW (ref 150–400)
RBC: 3.29 MIL/uL — ABNORMAL LOW (ref 3.87–5.11)
RDW: 18.1 % — ABNORMAL HIGH (ref 11.5–15.5)
WBC: 7.8 10*3/uL (ref 4.0–10.5)
nRBC: 0 % (ref 0.0–0.2)

## 2023-02-20 LAB — BASIC METABOLIC PANEL
Anion gap: 15 (ref 5–15)
BUN: 26 mg/dL — ABNORMAL HIGH (ref 6–20)
CO2: 29 mmol/L (ref 22–32)
Calcium: 9.4 mg/dL (ref 8.9–10.3)
Chloride: 94 mmol/L — ABNORMAL LOW (ref 98–111)
Creatinine, Ser: 3.75 mg/dL — ABNORMAL HIGH (ref 0.44–1.00)
GFR, Estimated: 16 mL/min — ABNORMAL LOW (ref >60)
Glucose, Bld: 94 mg/dL (ref 70–99)
Potassium: 3.9 mmol/L (ref 3.5–5.1)
Sodium: 138 mmol/L (ref 135–145)

## 2023-02-20 LAB — TROPONIN I (HIGH SENSITIVITY)
Troponin I (High Sensitivity): 35 ng/L — ABNORMAL HIGH (ref <18)
Troponin I (High Sensitivity): 35 ng/L — ABNORMAL HIGH (ref <18)

## 2023-02-20 MED ORDER — ACETAMINOPHEN 325 MG PO TABS
650.0000 mg | ORAL_TABLET | Freq: Once | ORAL | Status: AC
  Administered 2023-02-20: 650 mg via ORAL
  Filled 2023-02-20: qty 2

## 2023-02-20 NOTE — Discharge Instructions (Signed)
Your work-up today was overall reassuring, a little bit of fluid in the chest. Make sure to go to your regular dialysis session today. Follow-up with your primary care doctor. Return here for new concerns.

## 2023-02-20 NOTE — ED Provider Notes (Signed)
[place] EMERGENCY DEPARTMENT AT [facility] Provider Note   CSN: [id number] Arrival date & time: 02/20/23  0234     History  Chief Complaint  Patient presents with   Chest Pain    [name] is a 32 y.o. female.  The history is provided by the patient and medical records.  Chest Pain  32 y.o. F with hx of spina bifida, ESRD on HD (schedule MWF), GERD, hypertension, depression, presenting to the ED with chest pain and shortness of breath.  States she woke up around 1 AM having chest pressure and shortness of breath, this did improve with nitro from EMS but now has headache.  States she had extra dialysis session yesterday, took off an additional 1.8 L, she did feel somewhat better after that.  She does seem to have issue lying flat if she feels like she cannot catch her breath.  Home Medications Prior to Admission medications   Medication Sig Start Date End Date Taking? Authorizing Provider  acetaminophen (TYLENOL) 500 MG tablet Take 1,000 mg by mouth every 6 (six) hours as needed for mild pain or headache.    [provider]  albuterol (VENTOLIN HFA) 108 (90 Base) MCG/ACT inhaler Inhale 1-2 puffs into the lungs every 6 (six) hours as needed for wheezing or shortness of breath. 09/15/22   Park Meo, FNP  amoxicillin-clavulanate (AUGMENTIN) 875-125 MG tablet Take 1 tablet by mouth every 12 (twelve) hours. Patient not taking: Reported on 11/17/2022 09/04/22   Mesner, Barbara Cower, MD  AURYXIA 1 GM 210 MG(Fe) tablet Take 630 mg by mouth 3 (three) times daily with meals. 04/15/18   [provider]  B Complex-C-Folic Acid (DIALYVITE TABLET) TABS Take 1 tablet by mouth daily.    [provider]  diphenhydrAMINE (BENADRYL) 25 mg capsule Take 25 mg by mouth daily as needed for allergies.    [provider]  Epoetin Alfa (EPOGEN IJ) Inject as directed See admin instructions. Every Tuesday, Thursday, Saturday    [provider]   hydrALAZINE (APRESOLINE) 25 MG tablet Take 25 mg by mouth 3 (three) times daily. 04/28/20   [provider]  Menthol-Zinc Oxide (CALMOSEPTINE) 0.44-20.6 % OINT Apply 1 Application topically daily. 11/17/22   Park Meo, FNP  Methoxy PEG-Epoetin Beta (MIRCERA IJ) Mircera 04/17/22 04/16/23  [provider]  metoprolol succinate (TOPROL-XL) 100 MG 24 hr tablet Take 100 mg by mouth daily. 12/29/19   [provider]  omeprazole (PRILOSEC) 40 MG capsule Take 40 mg by mouth every morning. 01/08/22   [provider]  ondansetron (ZOFRAN) 4 MG tablet Take 1 tablet (4 mg total) by mouth every 6 (six) hours as needed for nausea or vomiting. 04/04/21   Carroll Sage, PA-C  ondansetron (ZOFRAN-ODT) 4 MG disintegrating tablet Take 1 tablet (4 mg total) by mouth every 8 (eight) hours as needed. 06/13/22   Long, Arlyss Repress, MD  sodium zirconium cyclosilicate (LOKELMA) 5 g packet Take 10 g by mouth every Monday, Wednesday, and Friday. 07/20/21   [provider]      Allergies    Ciprofloxacin; Other; Peanut-containing drug products; Aleve [naproxen sodium]; Ceftriaxone; Coconut (cocos nucifera); Influenza vaccines; Tetanus toxoid, adsorbed; Tetanus toxoids; and Latex    Review of Systems   Review of Systems  Cardiovascular:  Positive for chest pain.  All other systems reviewed and are negative.   Physical Exam Updated Vital Signs BP 139/89   Pulse 85   Temp 98.5 F (36.9  C) (Oral)   Resp (!) 29   Ht 2' (0.61 m)   Wt 33.1 kg   SpO2 99%   BMI 89.07 kg/m   Physical Exam Vitals and nursing note reviewed.  Constitutional:      Appearance: She is well-developed.  HENT:     Head: Normocephalic and atraumatic.  Eyes:     Conjunctiva/sclera: Conjunctivae normal.     Pupils: Pupils are equal, round, and reactive to light.  Cardiovascular:     Rate and Rhythm: Normal rate and regular rhythm.     Heart sounds: Normal heart sounds.  Pulmonary:     Effort:  Pulmonary effort is normal.     Breath sounds: Normal breath sounds.  Abdominal:     General: Bowel sounds are normal.     Palpations: Abdomen is soft.  Musculoskeletal:        General: Normal range of motion.     Cervical back: Normal range of motion.     Comments: Absent BLE   Skin:    General: Skin is warm and dry.  Neurological:     Mental Status: She is alert and oriented to person, place, and time.     ED Results / Procedures / Treatments   Labs (all labs ordered are listed, but only abnormal results are displayed) Labs Reviewed  CBC WITH DIFFERENTIAL/PLATELET - Abnormal; Notable for the following components:      Result Value   RBC 3.29 (*)    Hemoglobin 10.2 (*)    HCT 34.5 (*)    MCV 104.9 (*)    MCHC 29.6 (*)    RDW 18.1 (*)    Platelets 115 (*)    All other components within normal limits  BASIC METABOLIC PANEL - Abnormal; Notable for the following components:   Chloride 94 (*)    BUN 26 (*)    Creatinine, Ser 3.75 (*)    GFR, Estimated 16 (*)    All other components within normal limits  TROPONIN I (HIGH SENSITIVITY) - Abnormal; Notable for the following components:   Troponin I (High Sensitivity) 35 (*)    All other components within normal limits  TROPONIN I (HIGH SENSITIVITY) - Abnormal; Notable for the following components:   Troponin I (High Sensitivity) 35 (*)    All other components within normal limits    EKG None  Radiology DG Chest Port 1 View  Result Date: 02/20/2023 CLINICAL DATA:  Shortness of breath EXAM: PORTABLE CHEST 1 VIEW COMPARISON:  01/15/2023 FINDINGS: Cardiomegaly, vascular congestion. Small right pleural effusion. No overt edema or confluent opacities. No acute bony abnormality. IMPRESSION: Cardiomegaly, vascular congestion. Small right effusion. Electronically Signed   By: Charlett Nose M.D.   On: 02/20/2023 03:46    Procedures Procedures    Medications Ordered in ED Medications  acetaminophen (TYLENOL) tablet 650 mg (650  mg Oral Given 02/20/23 5621)    ED Course/ Medical Decision Making/ A&P                             Medical Decision Making Amount and/or Complexity of Data Reviewed Labs: ordered. Radiology: ordered and independent interpretation performed. ECG/medicine tests: ordered and independent interpretation performed.  Risk OTC drugs.   32 year old female presenting to the ED with chest pain and shortness of breath.  Has history of ESRD on HD, had extra session today for same.  She is afebrile and nontoxic in appearance.  She is in  no acute distress.  She does feel better after nitro with EMS, however now has a headache.  Lungs are clear without any wheezes or rhonchi.  EKG without acute ischemic changes.  Labs as above--normal potassium.  Troponin 35, similar to prior.  Chest x-ray with some mild vascular congestion.  Patient has been weaned off of O2 (arrived on 4L) and is maintaining normal saturations on room air.  She is in no acute distress.  Delta troponin obtained and remains flat at 35.  At this time, feel she is stable for discharge.  She has regularly scheduled dialysis this a.m.  Can follow-up with PCP otherwise.  Return here for new concerns.  Final Clinical Impression(s) / ED Diagnoses Final diagnoses:  Chest pain, unspecified type    Rx / DC Orders ED Discharge Orders     None         Garlon Hatchet, PA-C 02/20/23 0981    Tilden Fossa, MD 02/20/23 [phone]

## 2023-02-20 NOTE — Telephone Encounter (Signed)
Spoke w/pt regarding letter, stated that Amber has sent it to her MyChart, pt aware and nothing further.

## 2023-02-20 NOTE — ED Notes (Addendum)
BIBA from home for midstrenal CP, recevied extra treatment of HD yesterday, had 1.8 L pulled off, at 0100 woke up having chest pressure and SOB, received nitroglycerin x2 from EMS, pressure went from 6/10 to 2/10, has headache after nitroglycerin, received 324 mg ASA, LAVF HD MWF

## 2023-02-23 DIAGNOSIS — N186 End stage renal disease: Secondary | ICD-10-CM | POA: Diagnosis not present

## 2023-02-23 DIAGNOSIS — E162 Hypoglycemia, unspecified: Secondary | ICD-10-CM | POA: Diagnosis not present

## 2023-02-23 DIAGNOSIS — Z992 Dependence on renal dialysis: Secondary | ICD-10-CM | POA: Diagnosis not present

## 2023-02-23 DIAGNOSIS — E8770 Fluid overload, unspecified: Secondary | ICD-10-CM | POA: Diagnosis not present

## 2023-02-23 DIAGNOSIS — D631 Anemia in chronic kidney disease: Secondary | ICD-10-CM | POA: Diagnosis not present

## 2023-02-23 DIAGNOSIS — N2581 Secondary hyperparathyroidism of renal origin: Secondary | ICD-10-CM | POA: Diagnosis not present

## 2023-02-25 DIAGNOSIS — Z992 Dependence on renal dialysis: Secondary | ICD-10-CM | POA: Diagnosis not present

## 2023-02-25 DIAGNOSIS — E162 Hypoglycemia, unspecified: Secondary | ICD-10-CM | POA: Diagnosis not present

## 2023-02-25 DIAGNOSIS — N186 End stage renal disease: Secondary | ICD-10-CM | POA: Diagnosis not present

## 2023-02-25 DIAGNOSIS — E8770 Fluid overload, unspecified: Secondary | ICD-10-CM | POA: Diagnosis not present

## 2023-02-25 DIAGNOSIS — D631 Anemia in chronic kidney disease: Secondary | ICD-10-CM | POA: Diagnosis not present

## 2023-02-25 DIAGNOSIS — N2581 Secondary hyperparathyroidism of renal origin: Secondary | ICD-10-CM | POA: Diagnosis not present

## 2023-02-26 DIAGNOSIS — Z992 Dependence on renal dialysis: Secondary | ICD-10-CM | POA: Diagnosis not present

## 2023-02-26 DIAGNOSIS — N186 End stage renal disease: Secondary | ICD-10-CM | POA: Diagnosis not present

## 2023-02-26 DIAGNOSIS — I120 Hypertensive chronic kidney disease with stage 5 chronic kidney disease or end stage renal disease: Secondary | ICD-10-CM | POA: Diagnosis not present

## 2023-02-27 DIAGNOSIS — N2581 Secondary hyperparathyroidism of renal origin: Secondary | ICD-10-CM | POA: Diagnosis not present

## 2023-02-27 DIAGNOSIS — Z992 Dependence on renal dialysis: Secondary | ICD-10-CM | POA: Diagnosis not present

## 2023-02-27 DIAGNOSIS — N186 End stage renal disease: Secondary | ICD-10-CM | POA: Diagnosis not present

## 2023-03-02 DIAGNOSIS — Z992 Dependence on renal dialysis: Secondary | ICD-10-CM | POA: Diagnosis not present

## 2023-03-02 DIAGNOSIS — N2581 Secondary hyperparathyroidism of renal origin: Secondary | ICD-10-CM | POA: Diagnosis not present

## 2023-03-02 DIAGNOSIS — N186 End stage renal disease: Secondary | ICD-10-CM | POA: Diagnosis not present

## 2023-03-04 DIAGNOSIS — Z992 Dependence on renal dialysis: Secondary | ICD-10-CM | POA: Diagnosis not present

## 2023-03-04 DIAGNOSIS — N186 End stage renal disease: Secondary | ICD-10-CM | POA: Diagnosis not present

## 2023-03-04 DIAGNOSIS — N2581 Secondary hyperparathyroidism of renal origin: Secondary | ICD-10-CM | POA: Diagnosis not present

## 2023-03-06 DIAGNOSIS — N2581 Secondary hyperparathyroidism of renal origin: Secondary | ICD-10-CM | POA: Diagnosis not present

## 2023-03-06 DIAGNOSIS — N186 End stage renal disease: Secondary | ICD-10-CM | POA: Diagnosis not present

## 2023-03-06 DIAGNOSIS — Z992 Dependence on renal dialysis: Secondary | ICD-10-CM | POA: Diagnosis not present

## 2023-03-09 DIAGNOSIS — Z992 Dependence on renal dialysis: Secondary | ICD-10-CM | POA: Diagnosis not present

## 2023-03-09 DIAGNOSIS — N186 End stage renal disease: Secondary | ICD-10-CM | POA: Diagnosis not present

## 2023-03-09 DIAGNOSIS — N2581 Secondary hyperparathyroidism of renal origin: Secondary | ICD-10-CM | POA: Diagnosis not present

## 2023-03-11 DIAGNOSIS — N186 End stage renal disease: Secondary | ICD-10-CM | POA: Diagnosis not present

## 2023-03-11 DIAGNOSIS — N2581 Secondary hyperparathyroidism of renal origin: Secondary | ICD-10-CM | POA: Diagnosis not present

## 2023-03-11 DIAGNOSIS — Z992 Dependence on renal dialysis: Secondary | ICD-10-CM | POA: Diagnosis not present

## 2023-03-13 DIAGNOSIS — N2581 Secondary hyperparathyroidism of renal origin: Secondary | ICD-10-CM | POA: Diagnosis not present

## 2023-03-13 DIAGNOSIS — N186 End stage renal disease: Secondary | ICD-10-CM | POA: Diagnosis not present

## 2023-03-13 DIAGNOSIS — Z992 Dependence on renal dialysis: Secondary | ICD-10-CM | POA: Diagnosis not present

## 2023-03-16 DIAGNOSIS — Z992 Dependence on renal dialysis: Secondary | ICD-10-CM | POA: Diagnosis not present

## 2023-03-16 DIAGNOSIS — N186 End stage renal disease: Secondary | ICD-10-CM | POA: Diagnosis not present

## 2023-03-16 DIAGNOSIS — N2581 Secondary hyperparathyroidism of renal origin: Secondary | ICD-10-CM | POA: Diagnosis not present

## 2023-03-18 DIAGNOSIS — Z992 Dependence on renal dialysis: Secondary | ICD-10-CM | POA: Diagnosis not present

## 2023-03-18 DIAGNOSIS — N186 End stage renal disease: Secondary | ICD-10-CM | POA: Diagnosis not present

## 2023-03-18 DIAGNOSIS — N2581 Secondary hyperparathyroidism of renal origin: Secondary | ICD-10-CM | POA: Diagnosis not present

## 2023-03-20 ENCOUNTER — Emergency Department (HOSPITAL_COMMUNITY)
Admission: EM | Admit: 2023-03-20 | Discharge: 2023-03-20 | Disposition: A | Discharge status: Home / Self Care | Payer: Medicare Other | Source: Home / Self Care | Attending: Emergency Medicine | Admitting: Emergency Medicine

## 2023-03-20 ENCOUNTER — Other Ambulatory Visit: Payer: Self-pay

## 2023-03-20 ENCOUNTER — Encounter (HOSPITAL_COMMUNITY): Payer: Self-pay | Admitting: Emergency Medicine

## 2023-03-20 DIAGNOSIS — I120 Hypertensive chronic kidney disease with stage 5 chronic kidney disease or end stage renal disease: Secondary | ICD-10-CM | POA: Diagnosis not present

## 2023-03-20 DIAGNOSIS — N2581 Secondary hyperparathyroidism of renal origin: Secondary | ICD-10-CM | POA: Diagnosis not present

## 2023-03-20 DIAGNOSIS — N186 End stage renal disease: Secondary | ICD-10-CM | POA: Diagnosis not present

## 2023-03-20 DIAGNOSIS — R58 Hemorrhage, not elsewhere classified: Secondary | ICD-10-CM | POA: Diagnosis not present

## 2023-03-20 DIAGNOSIS — R040 Epistaxis: Secondary | ICD-10-CM | POA: Insufficient documentation

## 2023-03-20 DIAGNOSIS — Z7401 Bed confinement status: Secondary | ICD-10-CM | POA: Diagnosis not present

## 2023-03-20 DIAGNOSIS — R29898 Other symptoms and signs involving the musculoskeletal system: Secondary | ICD-10-CM | POA: Diagnosis not present

## 2023-03-20 DIAGNOSIS — Z91010 Allergy to peanuts: Secondary | ICD-10-CM | POA: Diagnosis not present

## 2023-03-20 DIAGNOSIS — Z91040 Latex allergy status: Secondary | ICD-10-CM | POA: Diagnosis not present

## 2023-03-20 DIAGNOSIS — Z992 Dependence on renal dialysis: Secondary | ICD-10-CM | POA: Diagnosis not present

## 2023-03-20 MED ORDER — SILVER NITRATE-POT NITRATE 75-25 % EX MISC
1.0000 | Freq: Once | CUTANEOUS | Status: AC
  Administered 2023-03-20: 1 via TOPICAL
  Filled 2023-03-20: qty 1

## 2023-03-20 MED ORDER — LIDOCAINE-EPINEPHRINE-TETRACAINE (LET) TOPICAL GEL
3.0000 mL | Freq: Once | TOPICAL | Status: AC
  Administered 2023-03-20: 3 mL via TOPICAL
  Filled 2023-03-20: qty 3

## 2023-03-20 MED ORDER — OXYMETAZOLINE HCL 0.05 % NA SOLN
1.0000 | Freq: Once | NASAL | Status: AC
  Administered 2023-03-20: 1 via NASAL
  Filled 2023-03-20: qty 30

## 2023-03-20 NOTE — ED Triage Notes (Signed)
Per ems, pt from home c/o nosebleed that started at 0145. Afrin was given at 0215. Pt was passing multiple clots out of both nares. Pt with hx of nosebleeds but never this bad. Pt denies any pain. Nosebleed stopped 5 mins pta. EMS VSS. Pt left arm restricted, dialysis due at noon today.

## 2023-03-20 NOTE — ED Provider Notes (Cosign Needed Addendum)
[place] EMERGENCY DEPARTMENT AT [facility] Provider Note   CSN: [id number] Arrival date & time: 03/20/23  0251     History  Chief Complaint  Patient presents with   Epistaxis    [name] is a 32 y.o. female.  Patient with past medical history significant for spina bifida, hypertension, anemia, end-stage renal disease on dialysis presents to the emergency department via EMS complaining of a nosebleed that began at 1:45 AM.  EMS administered Afrin at 215 and upon arrival no longer had a nosebleed.  She denies chest pain, shortness of breath, abdominal pain, nausea, vomiting, fevers, recent upper respiratory infection.  HPI     Home Medications Prior to Admission medications   Medication Sig Start Date End Date Taking? Authorizing Provider  acetaminophen (TYLENOL) 500 MG tablet Take 1,000 mg by mouth every 6 (six) hours as needed for mild pain or headache.    [provider]  albuterol (VENTOLIN HFA) 108 (90 Base) MCG/ACT inhaler Inhale 1-2 puffs into the lungs every 6 (six) hours as needed for wheezing or shortness of breath. 09/15/22   Park Meo, FNP  amoxicillin-clavulanate (AUGMENTIN) 875-125 MG tablet Take 1 tablet by mouth every 12 (twelve) hours. Patient not taking: Reported on 11/17/2022 09/04/22   Mesner, Barbara Cower, MD  AURYXIA 1 GM 210 MG(Fe) tablet Take 630 mg by mouth 3 (three) times daily with meals. 04/15/18   [provider]  B Complex-C-Folic Acid (DIALYVITE TABLET) TABS Take 1 tablet by mouth daily.    [provider]  diphenhydrAMINE (BENADRYL) 25 mg capsule Take 25 mg by mouth daily as needed for allergies.    [provider]  Epoetin Alfa (EPOGEN IJ) Inject as directed See admin instructions. Every Tuesday, Thursday, Saturday    [provider]  hydrALAZINE (APRESOLINE) 25 MG tablet Take 25 mg by mouth 3 (three) times daily. 04/28/20   [provider]  Menthol-Zinc Oxide (CALMOSEPTINE) 0.44-20.6  % OINT Apply 1 Application topically daily. 11/17/22   Park Meo, FNP  Methoxy PEG-Epoetin Beta (MIRCERA IJ) Mircera 04/17/22 04/16/23  [provider]  metoprolol succinate (TOPROL-XL) 100 MG 24 hr tablet Take 100 mg by mouth daily. 12/29/19   [provider]  omeprazole (PRILOSEC) 40 MG capsule Take 40 mg by mouth every morning. 01/08/22   [provider]  ondansetron (ZOFRAN) 4 MG tablet Take 1 tablet (4 mg total) by mouth every 6 (six) hours as needed for nausea or vomiting. 04/04/21   Carroll Sage, PA-C  ondansetron (ZOFRAN-ODT) 4 MG disintegrating tablet Take 1 tablet (4 mg total) by mouth every 8 (eight) hours as needed. 06/13/22   Long, Arlyss Repress, MD  sodium zirconium cyclosilicate (LOKELMA) 5 g packet Take 10 g by mouth every Monday, Wednesday, and Friday. 07/20/21   [provider]      Allergies    Ciprofloxacin; Other; Peanut-containing drug products; Aleve [naproxen sodium]; Ceftriaxone; Coconut (cocos nucifera); Influenza vaccines; Tetanus toxoid, adsorbed; Tetanus toxoids; and Latex    Review of Systems   Review of Systems  Physical Exam Updated Vital Signs BP (!) 140/89   Pulse 91   Temp 98.5 F (36.9 C) (Oral)   Resp 18   SpO2 93%  Physical Exam Vitals and nursing note reviewed.  HENT:     Head: Normocephalic and atraumatic.     Nose:     Right Nostril: No foreign body, epistaxis or septal hematoma.     Left Nostril: Epistaxis present.  No foreign body or septal hematoma.     Comments: Patient with what appears to be a bleeding site in the anterior left nare.  Bleeding stopped upon initial assessment Cardiovascular:     Rate and Rhythm: Normal rate.  Pulmonary:     Effort: Pulmonary effort is normal. No respiratory distress.  Musculoskeletal:        General: No signs of injury.     Cervical back: Normal range of motion.  Skin:    General: Skin is dry.  Neurological:     Mental Status: She is alert.  Psychiatric:         Speech: Speech normal.        Behavior: Behavior normal.     ED Results / Procedures / Treatments   Labs (all labs ordered are listed, but only abnormal results are displayed) Labs Reviewed - No data to display  EKG None  Radiology No results found.  Procedures .Epistaxis Management  Date/Time: 03/20/2023 5:57 AM  Performed by: Darrick Grinder, PA-C Authorized by: Darrick Grinder, PA-C   Consent:    Consent obtained:  Verbal   Consent given by:  Patient   Risks discussed:  Pain Anesthesia:    Anesthesia method:  None Procedure details:    Treatment site:  L anterior   Treatment method:  Silver nitrate Post-procedure details:    Assessment:  Bleeding decreased   Procedure completion:  Tolerated well, no immediate complications     Medications Ordered in ED Medications  oxymetazoline (AFRIN) 0.05 % nasal spray 1 spray (1 spray Each Nare Given 03/20/23 0352)    ED Course/ Medical Decision Making/ A&P                                 Medical Decision Making Risk OTC drugs. Prescription drug management.   Patient presents to the emergency department with a chief complaint of epistaxis.  Her nosebleed had stopped prior to arrival at the emergency department.  I was monitoring the patient and after about 20 minutes the patient had a repeat nosebleed.  I ordered Afrin for the patient which was administered for her epistaxis.  Upon reassessment the patient's nosebleed had once again stopped.  The patient has been monitored for approximately 1 hour since most recent Afrin administration.  She has had no further nosebleeds.  At this time the patient is stable for discharge home.  She has been instructed in nosebleed care at home and also provided with return precautions.  Addendum: Prior to discharge patient began to experience repeat nosebleed that appeared to be coming from the left anterior nare.  I ordered and used silver nitrate to cauterize the wound. Bleeding  significantly decreased. I followed this with packing with a cotton ball soaked in LET. Upon removal bleeding appeared to have stopped.   I discussed nasal packing with the patient.  She has had multiple recurrences of very minor nosebleeds since being here in the emergency department.  I explained we can discharge her home with a chance of small nosebleed or could pack her nose.  The patient declined packing at this time.  Will discharge home.      Final Clinical Impression(s) / ED Diagnoses Final diagnoses:  Epistaxis    Rx / DC Orders ED Discharge Orders     None         Pamala Duffel 03/20/23 0541    Marcelline Deist,  Dorisann Frames, PA-C 03/20/23 4098    Tilden Fossa, MD 03/21/23 [phone]

## 2023-03-20 NOTE — Discharge Instructions (Signed)
You were evaluated today for a nosebleed.  If you have subsequent nosebleeds at home please use Afrin, 1 to 2 sprays per nostril, followed by pressure for 15 minutes.  If your nose continues to bleed please seek further care at the emergency department.

## 2023-03-23 DIAGNOSIS — Z992 Dependence on renal dialysis: Secondary | ICD-10-CM | POA: Diagnosis not present

## 2023-03-23 DIAGNOSIS — N2581 Secondary hyperparathyroidism of renal origin: Secondary | ICD-10-CM | POA: Diagnosis not present

## 2023-03-23 DIAGNOSIS — N186 End stage renal disease: Secondary | ICD-10-CM | POA: Diagnosis not present

## 2023-03-25 DIAGNOSIS — Z992 Dependence on renal dialysis: Secondary | ICD-10-CM | POA: Diagnosis not present

## 2023-03-25 DIAGNOSIS — N186 End stage renal disease: Secondary | ICD-10-CM | POA: Diagnosis not present

## 2023-03-25 DIAGNOSIS — N2581 Secondary hyperparathyroidism of renal origin: Secondary | ICD-10-CM | POA: Diagnosis not present

## 2023-03-27 DIAGNOSIS — N2581 Secondary hyperparathyroidism of renal origin: Secondary | ICD-10-CM | POA: Diagnosis not present

## 2023-03-27 DIAGNOSIS — N186 End stage renal disease: Secondary | ICD-10-CM | POA: Diagnosis not present

## 2023-03-27 DIAGNOSIS — Z992 Dependence on renal dialysis: Secondary | ICD-10-CM | POA: Diagnosis not present

## 2023-03-29 DIAGNOSIS — N186 End stage renal disease: Secondary | ICD-10-CM | POA: Diagnosis not present

## 2023-03-29 DIAGNOSIS — I120 Hypertensive chronic kidney disease with stage 5 chronic kidney disease or end stage renal disease: Secondary | ICD-10-CM | POA: Diagnosis not present

## 2023-03-29 DIAGNOSIS — Z992 Dependence on renal dialysis: Secondary | ICD-10-CM | POA: Diagnosis not present

## 2023-03-30 DIAGNOSIS — N186 End stage renal disease: Secondary | ICD-10-CM | POA: Diagnosis not present

## 2023-03-30 DIAGNOSIS — N2581 Secondary hyperparathyroidism of renal origin: Secondary | ICD-10-CM | POA: Diagnosis not present

## 2023-03-30 DIAGNOSIS — Z992 Dependence on renal dialysis: Secondary | ICD-10-CM | POA: Diagnosis not present

## 2023-04-01 DIAGNOSIS — Z992 Dependence on renal dialysis: Secondary | ICD-10-CM | POA: Diagnosis not present

## 2023-04-01 DIAGNOSIS — N186 End stage renal disease: Secondary | ICD-10-CM | POA: Diagnosis not present

## 2023-04-01 DIAGNOSIS — N2581 Secondary hyperparathyroidism of renal origin: Secondary | ICD-10-CM | POA: Diagnosis not present

## 2023-04-03 DIAGNOSIS — Z992 Dependence on renal dialysis: Secondary | ICD-10-CM | POA: Diagnosis not present

## 2023-04-03 DIAGNOSIS — N186 End stage renal disease: Secondary | ICD-10-CM | POA: Diagnosis not present

## 2023-04-03 DIAGNOSIS — N2581 Secondary hyperparathyroidism of renal origin: Secondary | ICD-10-CM | POA: Diagnosis not present

## 2023-04-06 DIAGNOSIS — N2581 Secondary hyperparathyroidism of renal origin: Secondary | ICD-10-CM | POA: Diagnosis not present

## 2023-04-06 DIAGNOSIS — Z992 Dependence on renal dialysis: Secondary | ICD-10-CM | POA: Diagnosis not present

## 2023-04-06 DIAGNOSIS — N186 End stage renal disease: Secondary | ICD-10-CM | POA: Diagnosis not present

## 2023-04-08 DIAGNOSIS — Z992 Dependence on renal dialysis: Secondary | ICD-10-CM | POA: Diagnosis not present

## 2023-04-08 DIAGNOSIS — N2581 Secondary hyperparathyroidism of renal origin: Secondary | ICD-10-CM | POA: Diagnosis not present

## 2023-04-08 DIAGNOSIS — N186 End stage renal disease: Secondary | ICD-10-CM | POA: Diagnosis not present

## 2023-04-10 DIAGNOSIS — Z992 Dependence on renal dialysis: Secondary | ICD-10-CM | POA: Diagnosis not present

## 2023-04-10 DIAGNOSIS — N186 End stage renal disease: Secondary | ICD-10-CM | POA: Diagnosis not present

## 2023-04-10 DIAGNOSIS — N2581 Secondary hyperparathyroidism of renal origin: Secondary | ICD-10-CM | POA: Diagnosis not present

## 2023-04-13 DIAGNOSIS — N2581 Secondary hyperparathyroidism of renal origin: Secondary | ICD-10-CM | POA: Diagnosis not present

## 2023-04-13 DIAGNOSIS — N186 End stage renal disease: Secondary | ICD-10-CM | POA: Diagnosis not present

## 2023-04-13 DIAGNOSIS — Z992 Dependence on renal dialysis: Secondary | ICD-10-CM | POA: Diagnosis not present

## 2023-04-15 DIAGNOSIS — Z992 Dependence on renal dialysis: Secondary | ICD-10-CM | POA: Diagnosis not present

## 2023-04-15 DIAGNOSIS — N186 End stage renal disease: Secondary | ICD-10-CM | POA: Diagnosis not present

## 2023-04-15 DIAGNOSIS — N2581 Secondary hyperparathyroidism of renal origin: Secondary | ICD-10-CM | POA: Diagnosis not present

## 2023-04-17 DIAGNOSIS — Z992 Dependence on renal dialysis: Secondary | ICD-10-CM | POA: Diagnosis not present

## 2023-04-17 DIAGNOSIS — N2581 Secondary hyperparathyroidism of renal origin: Secondary | ICD-10-CM | POA: Diagnosis not present

## 2023-04-17 DIAGNOSIS — N186 End stage renal disease: Secondary | ICD-10-CM | POA: Diagnosis not present

## 2023-04-20 DIAGNOSIS — N2581 Secondary hyperparathyroidism of renal origin: Secondary | ICD-10-CM | POA: Diagnosis not present

## 2023-04-20 DIAGNOSIS — Z992 Dependence on renal dialysis: Secondary | ICD-10-CM | POA: Diagnosis not present

## 2023-04-20 DIAGNOSIS — N186 End stage renal disease: Secondary | ICD-10-CM | POA: Diagnosis not present

## 2023-04-21 ENCOUNTER — Ambulatory Visit: Payer: Medicare Other | Admitting: Family Medicine

## 2023-04-22 DIAGNOSIS — N186 End stage renal disease: Secondary | ICD-10-CM | POA: Diagnosis not present

## 2023-04-22 DIAGNOSIS — N2581 Secondary hyperparathyroidism of renal origin: Secondary | ICD-10-CM | POA: Diagnosis not present

## 2023-04-22 DIAGNOSIS — Z992 Dependence on renal dialysis: Secondary | ICD-10-CM | POA: Diagnosis not present

## 2023-04-23 ENCOUNTER — Ambulatory Visit (INDEPENDENT_AMBULATORY_CARE_PROVIDER_SITE_OTHER): Payer: Medicare Other | Admitting: Family Medicine

## 2023-04-23 ENCOUNTER — Encounter: Payer: Self-pay | Admitting: Family Medicine

## 2023-04-23 VITALS — BP 120/82 | HR 74 | Temp 97.8°F | Ht <= 58 in

## 2023-04-23 DIAGNOSIS — R21 Rash and other nonspecific skin eruption: Secondary | ICD-10-CM | POA: Diagnosis not present

## 2023-04-23 DIAGNOSIS — N186 End stage renal disease: Secondary | ICD-10-CM | POA: Diagnosis not present

## 2023-04-23 DIAGNOSIS — T7840XA Allergy, unspecified, initial encounter: Secondary | ICD-10-CM | POA: Insufficient documentation

## 2023-04-23 MED ORDER — EPINEPHRINE 0.3 MG/0.3ML IJ SOAJ: 0.3000 mg | INTRAMUSCULAR | 1 refills | Status: AC | PRN

## 2023-04-23 NOTE — Assessment & Plan Note (Signed)
Patient reports history of recurring rash to her face during dialysis for last few months and was instructed to see her PCP. No swelling of face or throat, scratchy throat, difficulty swallowing or breathing. I encouraged her to speak with Nephrology about making medication changes and investigating this further. Will refer to allergy for testing. Epi-pen ordered for use for signs of anaphylaxis and admin instructions reviewed. Continue Benadryl prior to each treatment.

## 2023-04-23 NOTE — Progress Notes (Signed)
Subjective:  HPI: [name] is a 32 y.o. female presenting on 04/23/2023 for Follow-up (Developed rash during dialysis./)   HPI Patient is in today for rash to her face described as "scratches and welps", redness, and itching. She receives Iron, Vitamin D, and Mircera during dialysis. Has tried Benadryl and it relieves the itching. This has been occurring for past 1-2 months only on dialysis days. She denies swelling, difficulty swallowing or breathing.  Review of Systems  All other systems reviewed and are negative.   Relevant past medical history reviewed and updated as indicated.   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   Depression 05/14/2015   Dialysis care    ESRD (end stage renal disease) on dialysis [facility])    GERD (gastroesophageal reflux disease) 01/07/2017   HTN (hypertension) 05/02/2011   Murmur, cardiac 07/17/2022   Spina bifida    UTI (lower urinary tract infection)    Viral URI with cough 09/15/2022     Past Surgical History:  Procedure Laterality Date   A/V FISTULAGRAM Left 10/14/2021   Procedure: A/V Fistulagram;  Surgeon: Maeola Harman, MD;  Location: [facility] INVASIVE CV LAB;  Service: Cardiovascular;  Laterality: Left;   AV FISTULA PLACEMENT     left arm   INSERTION OF DIALYSIS CATHETER N/A 05/08/2020   Procedure: ATTEMPTED, UNSUCCESSFUL INSERTION OF DIALYSIS CATHETER TUNNELED RIGHT INTERNAL JUGULAR;  Surgeon: Lucretia Roers, MD;  Location: [facility] ORS;  Service: General;  Laterality: N/A;   IR FLUORO GUIDE CV LINE RIGHT  05/09/2020   IR US GUIDE VASC ACCESS RIGHT  05/09/2020    Allergies and medications reviewed and updated.   Current Outpatient Medications:    acetaminophen (TYLENOL) 500 MG tablet, Take 1,000 mg by mouth every 6 (six) hours as needed for mild pain or headache., Disp: , Rfl:    albuterol (VENTOLIN  HFA) 108 (90 Base) MCG/ACT inhaler, Inhale 1-2 puffs into the lungs every 6 (six) hours as needed for wheezing or shortness of breath., Disp: 1 each, Rfl: 0   amoxicillin-clavulanate (AUGMENTIN) 875-125 MG tablet, Take 1 tablet by mouth every 12 (twelve) hours., Disp: 14 tablet, Rfl: 0   AURYXIA 1 GM 210 MG(Fe) tablet, Take 630 mg by mouth 3 (three) times daily with meals., Disp: , Rfl: 3   B Complex-C-Folic Acid (DIALYVITE TABLET) TABS, Take 1 tablet by mouth daily., Disp: , Rfl:    diphenhydrAMINE (BENADRYL) 25 mg capsule, Take 25 mg by mouth daily as needed for allergies., Disp: , Rfl:    Epoetin Alfa (EPOGEN IJ), Inject as directed See admin instructions. Every Tuesday, Thursday, Saturday, Disp: , Rfl:    hydrALAZINE (APRESOLINE) 25 MG tablet, Take 25 mg by mouth 3 (three) times daily., Disp: , Rfl:    Menthol-Zinc Oxide (CALMOSEPTINE) 0.44-20.6 % OINT, Apply 1 Application topically daily., Disp: 71 g, Rfl: 1   metoprolol succinate (TOPROL-XL) 100 MG 24 hr tablet, Take 100 mg by mouth daily., Disp: , Rfl:    omeprazole (PRILOSEC) 40 MG capsule, Take 40 mg by mouth every morning., Disp: , Rfl:    ondansetron (ZOFRAN) 4 MG tablet, Take 1 tablet (4 mg total) by mouth every 6 (six) hours as needed for nausea or vomiting., Disp: 12 tablet, Rfl: 0   ondansetron (ZOFRAN-ODT) 4 MG disintegrating tablet, Take 1 tablet (4 mg total) by mouth  every 8 (eight) hours as needed., Disp: 20 tablet, Rfl: 0   sodium zirconium cyclosilicate (LOKELMA) 5 g packet, Take 10 g by mouth every Monday, Wednesday, and Friday., Disp: , Rfl:   Allergies  Allergen Reactions   Ciprofloxacin Shortness Of Breath, Nausea And Vomiting and Other (See Comments)    HIGH FEVER and oral blisters    Other Anaphylaxis    Revaclear dialzer   Peanut-Containing Drug Products Anaphylaxis   Aleve [Naproxen Sodium] Other (See Comments)    G.I.Bleed   Ceftriaxone Other (See Comments)    Blisters in mouth    Coconut (Cocos Nucifera) Hives    Influenza Vaccines Nausea And Vomiting and Other (See Comments)    High fever   Tetanus Toxoid, Adsorbed Nausea And Vomiting and Other (See Comments)    HIGH FEVER, also   Tetanus Toxoids Nausea And Vomiting and Other (See Comments)    HIGH FEVER   Latex Itching and Rash    Objective:   BP 120/82   Pulse 74   Temp 97.8 F (36.6 C) (Oral)   Ht 2' (0.61 m)   SpO2 95%   BMI 89.07 kg/m      04/23/2023    2:07 PM 03/20/2023    8:12 AM 03/20/2023    2:55 AM  Vitals with BMI  Height 2\' 0"     Systolic 120 136 846  Diastolic 82 71 89  Pulse 74 89 91     Physical Exam Vitals and nursing note reviewed.  Constitutional:      Appearance: Normal appearance. She is normal weight.  HENT:     Head: Normocephalic and atraumatic.  Skin:    General: Skin is warm and dry.     Findings: No rash.  Neurological:     General: No focal deficit present.     Mental Status: She is alert and oriented to person, place, and time. Mental status is at baseline.  Psychiatric:        Mood and Affect: Mood normal.        Behavior: Behavior normal.        Thought Content: Thought content normal.        Judgment: Judgment normal.     Assessment & Plan:  Allergic reaction, initial encounter Assessment & Plan: Patient reports history of recurring rash to her face during dialysis for last few months and was instructed to see her PCP. No swelling of face or throat, scratchy throat, difficulty swallowing or breathing. I encouraged her to speak with Nephrology about making medication changes and investigating this further. Will refer to allergy for testing. Epi-pen ordered for use for signs of anaphylaxis and admin instructions reviewed. Continue Benadryl prior to each treatment.      Follow up plan: Return if symptoms worsen or fail to improve.  Park Meo, FNP

## 2023-04-24 DIAGNOSIS — Z992 Dependence on renal dialysis: Secondary | ICD-10-CM | POA: Diagnosis not present

## 2023-04-24 DIAGNOSIS — N2581 Secondary hyperparathyroidism of renal origin: Secondary | ICD-10-CM | POA: Diagnosis not present

## 2023-04-24 DIAGNOSIS — N186 End stage renal disease: Secondary | ICD-10-CM | POA: Diagnosis not present

## 2023-04-27 DIAGNOSIS — N186 End stage renal disease: Secondary | ICD-10-CM | POA: Diagnosis not present

## 2023-04-27 DIAGNOSIS — Z992 Dependence on renal dialysis: Secondary | ICD-10-CM | POA: Diagnosis not present

## 2023-04-27 DIAGNOSIS — N2581 Secondary hyperparathyroidism of renal origin: Secondary | ICD-10-CM | POA: Diagnosis not present

## 2023-04-28 DIAGNOSIS — N186 End stage renal disease: Secondary | ICD-10-CM | POA: Diagnosis not present

## 2023-04-28 DIAGNOSIS — Z992 Dependence on renal dialysis: Secondary | ICD-10-CM | POA: Diagnosis not present

## 2023-04-28 DIAGNOSIS — I120 Hypertensive chronic kidney disease with stage 5 chronic kidney disease or end stage renal disease: Secondary | ICD-10-CM | POA: Diagnosis not present

## 2023-04-29 DIAGNOSIS — Z992 Dependence on renal dialysis: Secondary | ICD-10-CM | POA: Diagnosis not present

## 2023-04-29 DIAGNOSIS — N2581 Secondary hyperparathyroidism of renal origin: Secondary | ICD-10-CM | POA: Diagnosis not present

## 2023-04-29 DIAGNOSIS — N186 End stage renal disease: Secondary | ICD-10-CM | POA: Diagnosis not present

## 2023-05-01 DIAGNOSIS — N2581 Secondary hyperparathyroidism of renal origin: Secondary | ICD-10-CM | POA: Diagnosis not present

## 2023-05-01 DIAGNOSIS — Z992 Dependence on renal dialysis: Secondary | ICD-10-CM | POA: Diagnosis not present

## 2023-05-01 DIAGNOSIS — N186 End stage renal disease: Secondary | ICD-10-CM | POA: Diagnosis not present

## 2023-05-04 DIAGNOSIS — Z992 Dependence on renal dialysis: Secondary | ICD-10-CM | POA: Diagnosis not present

## 2023-05-04 DIAGNOSIS — N186 End stage renal disease: Secondary | ICD-10-CM | POA: Diagnosis not present

## 2023-05-04 DIAGNOSIS — N2581 Secondary hyperparathyroidism of renal origin: Secondary | ICD-10-CM | POA: Diagnosis not present

## 2023-05-06 DIAGNOSIS — Z992 Dependence on renal dialysis: Secondary | ICD-10-CM | POA: Diagnosis not present

## 2023-05-06 DIAGNOSIS — N2581 Secondary hyperparathyroidism of renal origin: Secondary | ICD-10-CM | POA: Diagnosis not present

## 2023-05-06 DIAGNOSIS — N186 End stage renal disease: Secondary | ICD-10-CM | POA: Diagnosis not present

## 2023-05-08 DIAGNOSIS — N186 End stage renal disease: Secondary | ICD-10-CM | POA: Diagnosis not present

## 2023-05-08 DIAGNOSIS — N2581 Secondary hyperparathyroidism of renal origin: Secondary | ICD-10-CM | POA: Diagnosis not present

## 2023-05-08 DIAGNOSIS — Z992 Dependence on renal dialysis: Secondary | ICD-10-CM | POA: Diagnosis not present

## 2023-05-11 DIAGNOSIS — N186 End stage renal disease: Secondary | ICD-10-CM | POA: Diagnosis not present

## 2023-05-11 DIAGNOSIS — Z992 Dependence on renal dialysis: Secondary | ICD-10-CM | POA: Diagnosis not present

## 2023-05-11 DIAGNOSIS — N2581 Secondary hyperparathyroidism of renal origin: Secondary | ICD-10-CM | POA: Diagnosis not present

## 2023-05-13 DIAGNOSIS — N2581 Secondary hyperparathyroidism of renal origin: Secondary | ICD-10-CM | POA: Diagnosis not present

## 2023-05-13 DIAGNOSIS — N186 End stage renal disease: Secondary | ICD-10-CM | POA: Diagnosis not present

## 2023-05-13 DIAGNOSIS — Z992 Dependence on renal dialysis: Secondary | ICD-10-CM | POA: Diagnosis not present

## 2023-05-15 DIAGNOSIS — N2581 Secondary hyperparathyroidism of renal origin: Secondary | ICD-10-CM | POA: Diagnosis not present

## 2023-05-15 DIAGNOSIS — Z992 Dependence on renal dialysis: Secondary | ICD-10-CM | POA: Diagnosis not present

## 2023-05-15 DIAGNOSIS — N186 End stage renal disease: Secondary | ICD-10-CM | POA: Diagnosis not present

## 2023-05-18 DIAGNOSIS — N2581 Secondary hyperparathyroidism of renal origin: Secondary | ICD-10-CM | POA: Diagnosis not present

## 2023-05-18 DIAGNOSIS — N186 End stage renal disease: Secondary | ICD-10-CM | POA: Diagnosis not present

## 2023-05-18 DIAGNOSIS — Z992 Dependence on renal dialysis: Secondary | ICD-10-CM | POA: Diagnosis not present

## 2023-05-20 DIAGNOSIS — N2581 Secondary hyperparathyroidism of renal origin: Secondary | ICD-10-CM | POA: Diagnosis not present

## 2023-05-20 DIAGNOSIS — Z992 Dependence on renal dialysis: Secondary | ICD-10-CM | POA: Diagnosis not present

## 2023-05-20 DIAGNOSIS — N186 End stage renal disease: Secondary | ICD-10-CM | POA: Diagnosis not present

## 2023-05-22 ENCOUNTER — Telehealth (HOSPITAL_COMMUNITY): Payer: Self-pay | Admitting: Vascular Surgery

## 2023-05-22 DIAGNOSIS — Z992 Dependence on renal dialysis: Secondary | ICD-10-CM | POA: Diagnosis not present

## 2023-05-22 DIAGNOSIS — N186 End stage renal disease: Secondary | ICD-10-CM | POA: Diagnosis not present

## 2023-05-22 DIAGNOSIS — N2581 Secondary hyperparathyroidism of renal origin: Secondary | ICD-10-CM | POA: Diagnosis not present

## 2023-05-22 NOTE — Telephone Encounter (Signed)
Vm is full

## 2023-05-25 DIAGNOSIS — Z992 Dependence on renal dialysis: Secondary | ICD-10-CM | POA: Diagnosis not present

## 2023-05-25 DIAGNOSIS — N186 End stage renal disease: Secondary | ICD-10-CM | POA: Diagnosis not present

## 2023-05-25 DIAGNOSIS — N2581 Secondary hyperparathyroidism of renal origin: Secondary | ICD-10-CM | POA: Diagnosis not present

## 2023-05-25 NOTE — Progress Notes (Deleted)
NEUROLOGY CONSULTATION NOTE  [name] MRN: [id number] DOB: 10-Nov-1990  Referring provider: Kurtis Bushman, FNP Primary care provider: Kurtis Bushman, FNP  Reason for consult:  migraine  Assessment/Plan:   ***   Subjective:  [name] is a 32 year old female with ESRD on HD, HTN, anemia, and Spina bifida who presents for migraines.  History supplemented by referring provider's note.  Onset:  *** Location:  *** Quality:  *** Intensity:  ***.  *** denies new headache, thunderclap headache or severe headache that wakes *** from sleep. Aura:  *** Prodrome:  *** Postdrome:  *** Associated symptoms:  ***.  *** denies associated unilateral numbness or weakness. Duration:  *** Frequency:  *** Frequency of abortive medication: *** Triggers:  *** Relieving factors:  *** Activity:  ***  History of several past head CTs to evaluate headache.  CT head on 04/28/2022 personally reviewed was unremarkable.    Past NSAIDS/analgesics:  *** Past abortive triptans:  *** Past abortive ergotamine:  *** Past muscle relaxants:  *** Past anti-emetic:  Reglan, promethazine Past antihypertensive medications:  *** Past antidepressant medications:  sertraline Past anticonvulsant medications:  gabapentin Past anti-CGRP:  *** Past vitamins/Herbal/Supplements:  *** Past antihistamines/decongestants:  *** Other past therapies:  ***  Current NSAIDS/analgesics:  Tylenol Current triptans:  none Current ergotamine:  none Current anti-emetic:  Zofran 4mg  Current muscle relaxants:  none Current Antihypertensive medications:  metoprolol succinate XL 100mg  daily, hydralazine Current Antidepressant medications:  none Current Anticonvulsant medications:  none Current anti-CGRP:  none Current Vitamins/Herbal/Supplements:  B complex, folic acid Current Antihistamines/Decongestants:  Benadryl Other therapy:  *** Birth control:  ***   Caffeine:  *** Alcohol:  *** Smoker:  *** Diet:   *** Exercise:  *** Depression:  ***; Anxiety:  *** Other pain:  *** Sleep hygiene:  *** Family history of headache:  ***      PAST MEDICAL HISTORY: Past Medical History:  Diagnosis Date   Anemia associated with chronic renal failure    Blood transfusion    Caudal regression syndrome    Assoc with spina bifida.   Depression 05/14/2015   Dialysis care    ESRD (end stage renal disease) on dialysis [facility])    GERD (gastroesophageal reflux disease) 01/07/2017   HTN (hypertension) 05/02/2011   Murmur, cardiac 07/17/2022   Spina bifida    UTI (lower urinary tract infection)    Viral URI with cough 09/15/2022    PAST SURGICAL HISTORY: Past Surgical History:  Procedure Laterality Date   A/V FISTULAGRAM Left 10/14/2021   Procedure: A/V Fistulagram;  Surgeon: Maeola Harman, MD;  Location: [facility] INVASIVE CV LAB;  Service: Cardiovascular;  Laterality: Left;   AV FISTULA PLACEMENT     left arm   INSERTION OF DIALYSIS CATHETER N/A 05/08/2020   Procedure: ATTEMPTED, UNSUCCESSFUL INSERTION OF DIALYSIS CATHETER TUNNELED RIGHT INTERNAL JUGULAR;  Surgeon: Lucretia Roers, MD;  Location: [facility];  Service: General;  Laterality: N/A;   IR FLUORO GUIDE CV LINE RIGHT  05/09/2020   IR US GUIDE VASC ACCESS RIGHT  05/09/2020    MEDICATIONS: Current Outpatient Medications on File Prior to Visit  Medication Sig Dispense Refill   acetaminophen (TYLENOL) 500 MG tablet Take 1,000 mg by mouth every 6 (six) hours as needed for mild pain or headache.     albuterol (VENTOLIN HFA) 108 (90 Base) MCG/ACT inhaler Inhale 1-2 puffs into the lungs every 6 (six) hours as needed for wheezing or shortness of breath. 1 each 0  amoxicillin-clavulanate (AUGMENTIN) 875-125 MG tablet Take 1 tablet by mouth every 12 (twelve) hours. 14 tablet 0   AURYXIA 1 GM 210 MG(Fe) tablet Take 630 mg by mouth 3 (three) times daily with meals.  3   B Complex-C-Folic Acid (DIALYVITE TABLET) TABS Take 1 tablet by mouth daily.      diphenhydrAMINE (BENADRYL) 25 mg capsule Take 25 mg by mouth daily as needed for allergies.     EPINEPHrine 0.3 mg/0.3 mL IJ SOAJ injection Inject 0.3 mg into the muscle as needed for anaphylaxis. 2 each 1   Epoetin Alfa (EPOGEN IJ) Inject as directed See admin instructions. Every Tuesday, Thursday, Saturday     hydrALAZINE (APRESOLINE) 25 MG tablet Take 25 mg by mouth 3 (three) times daily.     Menthol-Zinc Oxide (CALMOSEPTINE) 0.44-20.6 % OINT Apply 1 Application topically daily. 71 g 1   metoprolol succinate (TOPROL-XL) 100 MG 24 hr tablet Take 100 mg by mouth daily.     omeprazole (PRILOSEC) 40 MG capsule Take 40 mg by mouth every morning.     ondansetron (ZOFRAN) 4 MG tablet Take 1 tablet (4 mg total) by mouth every 6 (six) hours as needed for nausea or vomiting. 12 tablet 0   ondansetron (ZOFRAN-ODT) 4 MG disintegrating tablet Take 1 tablet (4 mg total) by mouth every 8 (eight) hours as needed. 20 tablet 0   sodium zirconium cyclosilicate (LOKELMA) 5 g packet Take 10 g by mouth every Monday, Wednesday, and Friday.     No current facility-administered medications on file prior to visit.    ALLERGIES: Allergies  Allergen Reactions   Ciprofloxacin Shortness Of Breath, Nausea And Vomiting and Other (See Comments)    HIGH FEVER and oral blisters    Other Anaphylaxis    Revaclear dialzer   Peanut-Containing Drug Products Anaphylaxis   Aleve [Naproxen Sodium] Other (See Comments)    G.I.Bleed   Ceftriaxone Other (See Comments)    Blisters in mouth    Coconut (Cocos Nucifera) Hives   Influenza Vaccines Nausea And Vomiting and Other (See Comments)    High fever   Tetanus Toxoid, Adsorbed Nausea And Vomiting and Other (See Comments)    HIGH FEVER, also   Tetanus Toxoids Nausea And Vomiting and Other (See Comments)    HIGH FEVER   Latex Itching and Rash    FAMILY HISTORY: Family History  Problem Relation Age of Onset   Kidney cancer Other    Hypertension Maternal Grandmother     Arthritis Maternal Grandmother    Breast cancer Maternal Aunt    Colon cancer Neg Hx     Objective:  *** General: No acute distress.  Patient appears well-groomed.   Head:  Normocephalic/atraumatic Eyes:  fundi examined but not visualized Neck: supple, no paraspinal tenderness, full range of motion Back: No paraspinal tenderness Heart: regular rate and rhythm Lungs: Clear to auscultation bilaterally. Vascular: No carotid bruits. Neurological Exam: Mental status: alert and oriented to person, place, and time, speech fluent and not dysarthric, language intact. Cranial nerves: CN I: not tested CN II: pupils equal, round and reactive to light, visual fields intact CN III, IV, VI:  full range of motion, no nystagmus, no ptosis CN V: facial sensation intact. CN VII: upper and lower face symmetric CN VIII: hearing intact CN IX, X: gag intact, uvula midline CN XI: sternocleidomastoid and trapezius muscles intact CN XII: tongue midline Bulk & Tone: normal, no fasciculations. Motor:  muscle strength 5/5 throughout Sensation:  Pinprick, temperature and vibratory  sensation intact. Deep Tendon Reflexes:  2+ throughout,  toes downgoing.   Finger to nose testing:  Without dysmetria.   Heel to shin:  Without dysmetria.   Gait:  Normal station and stride.  Romberg negative.    Thank you for allowing me to take part in the care of this patient.  Shon Millet, DO  CC: ***

## 2023-05-26 ENCOUNTER — Encounter: Payer: Self-pay | Admitting: Neurology

## 2023-05-26 ENCOUNTER — Ambulatory Visit: Payer: Medicare Other | Admitting: Neurology

## 2023-05-27 DIAGNOSIS — Z992 Dependence on renal dialysis: Secondary | ICD-10-CM | POA: Diagnosis not present

## 2023-05-27 DIAGNOSIS — N186 End stage renal disease: Secondary | ICD-10-CM | POA: Diagnosis not present

## 2023-05-27 DIAGNOSIS — N2581 Secondary hyperparathyroidism of renal origin: Secondary | ICD-10-CM | POA: Diagnosis not present

## 2023-05-29 DIAGNOSIS — N2581 Secondary hyperparathyroidism of renal origin: Secondary | ICD-10-CM | POA: Diagnosis not present

## 2023-05-29 DIAGNOSIS — N186 End stage renal disease: Secondary | ICD-10-CM | POA: Diagnosis not present

## 2023-05-29 DIAGNOSIS — Z992 Dependence on renal dialysis: Secondary | ICD-10-CM | POA: Diagnosis not present

## 2023-05-29 DIAGNOSIS — I120 Hypertensive chronic kidney disease with stage 5 chronic kidney disease or end stage renal disease: Secondary | ICD-10-CM | POA: Diagnosis not present

## 2023-05-30 ENCOUNTER — Emergency Department (HOSPITAL_COMMUNITY): Payer: Medicare Other

## 2023-05-30 ENCOUNTER — Emergency Department (HOSPITAL_COMMUNITY)
Admission: EM | Admit: 2023-05-30 | Discharge: 2023-05-31 | Disposition: A | Discharge status: Home / Self Care | Payer: Medicare Other | Attending: Emergency Medicine | Admitting: Emergency Medicine

## 2023-05-30 DIAGNOSIS — D271 Benign neoplasm of left ovary: Secondary | ICD-10-CM | POA: Diagnosis not present

## 2023-05-30 DIAGNOSIS — I120 Hypertensive chronic kidney disease with stage 5 chronic kidney disease or end stage renal disease: Secondary | ICD-10-CM | POA: Diagnosis not present

## 2023-05-30 DIAGNOSIS — R1084 Generalized abdominal pain: Secondary | ICD-10-CM | POA: Diagnosis not present

## 2023-05-30 DIAGNOSIS — R1030 Lower abdominal pain, unspecified: Secondary | ICD-10-CM | POA: Diagnosis not present

## 2023-05-30 DIAGNOSIS — R0989 Other specified symptoms and signs involving the circulatory and respiratory systems: Secondary | ICD-10-CM | POA: Diagnosis not present

## 2023-05-30 DIAGNOSIS — Z91010 Allergy to peanuts: Secondary | ICD-10-CM | POA: Diagnosis not present

## 2023-05-30 DIAGNOSIS — N186 End stage renal disease: Secondary | ICD-10-CM | POA: Diagnosis not present

## 2023-05-30 DIAGNOSIS — R935 Abnormal findings on diagnostic imaging of other abdominal regions, including retroperitoneum: Secondary | ICD-10-CM | POA: Diagnosis not present

## 2023-05-30 DIAGNOSIS — R188 Other ascites: Secondary | ICD-10-CM | POA: Diagnosis not present

## 2023-05-30 DIAGNOSIS — Z20822 Contact with and (suspected) exposure to covid-19: Secondary | ICD-10-CM | POA: Diagnosis not present

## 2023-05-30 DIAGNOSIS — Z91040 Latex allergy status: Secondary | ICD-10-CM | POA: Diagnosis not present

## 2023-05-30 DIAGNOSIS — R197 Diarrhea, unspecified: Secondary | ICD-10-CM | POA: Insufficient documentation

## 2023-05-30 DIAGNOSIS — R059 Cough, unspecified: Secondary | ICD-10-CM | POA: Diagnosis not present

## 2023-05-30 DIAGNOSIS — R109 Unspecified abdominal pain: Secondary | ICD-10-CM | POA: Diagnosis present

## 2023-05-30 DIAGNOSIS — R932 Abnormal findings on diagnostic imaging of liver and biliary tract: Secondary | ICD-10-CM | POA: Diagnosis not present

## 2023-05-30 DIAGNOSIS — R112 Nausea with vomiting, unspecified: Secondary | ICD-10-CM | POA: Diagnosis not present

## 2023-05-30 DIAGNOSIS — I10 Essential (primary) hypertension: Secondary | ICD-10-CM | POA: Diagnosis not present

## 2023-05-30 DIAGNOSIS — Z79899 Other long term (current) drug therapy: Secondary | ICD-10-CM | POA: Insufficient documentation

## 2023-05-30 DIAGNOSIS — I517 Cardiomegaly: Secondary | ICD-10-CM | POA: Diagnosis not present

## 2023-05-30 DIAGNOSIS — R82998 Other abnormal findings in urine: Secondary | ICD-10-CM | POA: Diagnosis not present

## 2023-05-30 LAB — CBC WITH DIFFERENTIAL/PLATELET
Abs Immature Granulocytes: 0.04 10*3/uL (ref 0.00–0.07)
Basophils Absolute: 0 10*3/uL (ref 0.0–0.1)
Basophils Relative: 0 %
Eosinophils Absolute: 0.1 10*3/uL (ref 0.0–0.5)
Eosinophils Relative: 2 %
HCT: 34.9 % — ABNORMAL LOW (ref 36.0–46.0)
Hemoglobin: 10.6 g/dL — ABNORMAL LOW (ref 12.0–15.0)
Immature Granulocytes: 1 %
Lymphocytes Relative: 14 %
Lymphs Abs: 0.9 10*3/uL (ref 0.7–4.0)
MCH: 31.6 pg (ref 26.0–34.0)
MCHC: 30.4 g/dL (ref 30.0–36.0)
MCV: 104.2 fL — ABNORMAL HIGH (ref 80.0–100.0)
Monocytes Absolute: 0.7 10*3/uL (ref 0.1–1.0)
Monocytes Relative: 10 %
Neutro Abs: 4.6 10*3/uL (ref 1.7–7.7)
Neutrophils Relative %: 73 %
Platelets: 123 10*3/uL — ABNORMAL LOW (ref 150–400)
RBC: 3.35 MIL/uL — ABNORMAL LOW (ref 3.87–5.11)
RDW: 15.9 % — ABNORMAL HIGH (ref 11.5–15.5)
WBC: 6.3 10*3/uL (ref 4.0–10.5)
nRBC: 0 % (ref 0.0–0.2)

## 2023-05-30 LAB — LIPASE, BLOOD: Lipase: 81 U/L — ABNORMAL HIGH (ref 11–51)

## 2023-05-30 LAB — COMPREHENSIVE METABOLIC PANEL
ALT: 24 U/L (ref 0–44)
AST: 51 U/L — ABNORMAL HIGH (ref 15–41)
Albumin: 3.6 g/dL (ref 3.5–5.0)
Alkaline Phosphatase: 139 U/L — ABNORMAL HIGH (ref 38–126)
Anion gap: 17 — ABNORMAL HIGH (ref 5–15)
BUN: 29 mg/dL — ABNORMAL HIGH (ref 6–20)
CO2: 26 mmol/L (ref 22–32)
Calcium: 9.8 mg/dL (ref 8.9–10.3)
Chloride: 98 mmol/L (ref 98–111)
Creatinine, Ser: 5.29 mg/dL — ABNORMAL HIGH (ref 0.44–1.00)
GFR, Estimated: 11 mL/min — ABNORMAL LOW (ref >60)
Glucose, Bld: 99 mg/dL (ref 70–99)
Potassium: 3.8 mmol/L (ref 3.5–5.1)
Sodium: 141 mmol/L (ref 135–145)
Total Bilirubin: 1.3 mg/dL — ABNORMAL HIGH (ref 0.3–1.2)
Total Protein: 8.1 g/dL (ref 6.5–8.1)

## 2023-05-30 LAB — HCG, SERUM, QUALITATIVE: Preg, Serum: NEGATIVE

## 2023-05-30 LAB — CBG MONITORING, ED: Glucose-Capillary: 111 mg/dL — ABNORMAL HIGH (ref 70–99)

## 2023-05-30 MED ORDER — MORPHINE SULFATE (PF) 4 MG/ML IV SOLN
4.0000 mg | Freq: Once | INTRAVENOUS | Status: AC
  Administered 2023-05-30: 4 mg via INTRAVENOUS
  Filled 2023-05-30: qty 1

## 2023-05-30 MED ORDER — METOCLOPRAMIDE HCL 5 MG/ML IJ SOLN
10.0000 mg | Freq: Once | INTRAMUSCULAR | Status: AC
  Administered 2023-05-30: 10 mg via INTRAVENOUS
  Filled 2023-05-30: qty 2

## 2023-05-30 NOTE — ED Provider Notes (Signed)
[place] EMERGENCY DEPARTMENT AT [facility] Provider Note   CSN: [id number] Arrival date & time: 05/30/23  2015     History  Chief Complaint  Patient presents with   Abdominal Pain    [name] is a 32 y.o. female.  Patient is a 32 year old female with a past medical history of ESRD, hypertension, spina bifida, bilateral AKA presenting to the emergency department with abdominal pain, nausea and vomiting.  Patient states that her symptoms have been ongoing since Monday.  She states that her pain is across her lower abdomen.  She states she has associated diarrhea and has had some blood in her stool when she wipes.  She states that she has not missed any dialysis sessions this week and has no known sick contacts.  She denies any fevers.  She states that she still does make some urine and has had some dysuria.  She denies any hematuria.  She states that she has also had a mild nonproductive cough.  The history is provided by the patient.  Abdominal Pain      Home Medications Prior to Admission medications   Medication Sig Start Date End Date Taking? Authorizing Provider  acetaminophen (TYLENOL) 500 MG tablet Take 1,000 mg by mouth every 6 (six) hours as needed for mild pain or headache.    [provider]  albuterol (VENTOLIN HFA) 108 (90 Base) MCG/ACT inhaler Inhale 1-2 puffs into the lungs every 6 (six) hours as needed for wheezing or shortness of breath. 09/15/22   Park Meo, FNP  amoxicillin-clavulanate (AUGMENTIN) 875-125 MG tablet Take 1 tablet by mouth every 12 (twelve) hours. 09/04/22   Mesner, Barbara Cower, MD  AURYXIA 1 GM 210 MG(Fe) tablet Take 630 mg by mouth 3 (three) times daily with meals. 04/15/18   [provider]  B Complex-C-Folic Acid (DIALYVITE TABLET) TABS Take 1 tablet by mouth daily.    [provider]  diphenhydrAMINE (BENADRYL) 25 mg capsule Take 25 mg by mouth daily as needed for allergies.    [provider]   EPINEPHrine 0.3 mg/0.3 mL IJ SOAJ injection Inject 0.3 mg into the muscle as needed for anaphylaxis. 04/23/23   Park Meo, FNP  Epoetin Alfa (EPOGEN IJ) Inject as directed See admin instructions. Every Tuesday, Thursday, Saturday    [provider]  hydrALAZINE (APRESOLINE) 25 MG tablet Take 25 mg by mouth 3 (three) times daily. 04/28/20   [provider]  Menthol-Zinc Oxide (CALMOSEPTINE) 0.44-20.6 % OINT Apply 1 Application topically daily. 11/17/22   Park Meo, FNP  metoprolol succinate (TOPROL-XL) 100 MG 24 hr tablet Take 100 mg by mouth daily. 12/29/19   [provider]  omeprazole (PRILOSEC) 40 MG capsule Take 40 mg by mouth every morning. 01/08/22   [provider]  ondansetron (ZOFRAN) 4 MG tablet Take 1 tablet (4 mg total) by mouth every 6 (six) hours as needed for nausea or vomiting. 04/04/21   Carroll Sage, PA-C  ondansetron (ZOFRAN-ODT) 4 MG disintegrating tablet Take 1 tablet (4 mg total) by mouth every 8 (eight) hours as needed. 06/13/22   Long, Arlyss Repress, MD  sodium zirconium cyclosilicate (LOKELMA) 5 g packet Take 10 g by mouth every Monday, Wednesday, and Friday. 07/20/21   [provider]      Allergies    Ciprofloxacin; Other; Peanut-containing drug products; Aleve [naproxen sodium]; Ceftriaxone; Coconut (cocos nucifera); Influenza vaccines; Tetanus toxoid, adsorbed; Tetanus toxoids; Zofran [ondansetron]; and Latex    Review  of Systems   Review of Systems  Gastrointestinal:  Positive for abdominal pain.    Physical Exam Updated Vital Signs BP (!) 143/88   Pulse 88   Temp 99.1 F (37.3 C) (Oral)   SpO2 100%  Physical Exam Vitals and nursing note reviewed.  Constitutional:      General: She is not in acute distress.    Appearance: She is well-developed.  HENT:     Head: Normocephalic and atraumatic.     Mouth/Throat:     Mouth: Mucous membranes are moist.  Eyes:     Extraocular Movements: Extraocular  movements intact.  Cardiovascular:     Rate and Rhythm: Normal rate and regular rhythm.     Heart sounds: Normal heart sounds.  Pulmonary:     Effort: Pulmonary effort is normal.     Breath sounds: Normal breath sounds.  Abdominal:     General: Abdomen is flat.     Palpations: Abdomen is soft.     Tenderness: There is generalized abdominal tenderness. There is no right CVA tenderness, left CVA tenderness, guarding or rebound.  Skin:    General: Skin is warm and dry.  Neurological:     General: No focal deficit present.     Mental Status: She is alert and oriented to person, place, and time.  Psychiatric:        Mood and Affect: Mood normal.        Behavior: Behavior normal.     ED Results / Procedures / Treatments   Labs (all labs ordered are listed, but only abnormal results are displayed) Labs Reviewed  COMPREHENSIVE METABOLIC PANEL - Abnormal; Notable for the following components:      Result Value   BUN 29 (*)    Creatinine, Ser 5.29 (*)    AST 51 (*)    Alkaline Phosphatase 139 (*)    Total Bilirubin 1.3 (*)    GFR, Estimated 11 (*)    Anion gap 17 (*)    All other components within normal limits  CBC WITH DIFFERENTIAL/PLATELET - Abnormal; Notable for the following components:   RBC 3.35 (*)    Hemoglobin 10.6 (*)    HCT 34.9 (*)    MCV 104.2 (*)    RDW 15.9 (*)    Platelets 123 (*)    All other components within normal limits  LIPASE, BLOOD - Abnormal; Notable for the following components:   Lipase 81 (*)    All other components within normal limits  CBG MONITORING, ED - Abnormal; Notable for the following components:   Glucose-Capillary 111 (*)    All other components within normal limits  SARS CORONAVIRUS 2 BY RT PCR  HCG, SERUM, QUALITATIVE  URINALYSIS, ROUTINE W REFLEX MICROSCOPIC    EKG EKG Interpretation Date/Time:  Saturday May 30 2023 20:27:12 EDT Ventricular Rate:  85 PR Interval:  140 QRS Duration:  83 QT Interval:  428 QTC  Calculation: 509 R Axis:   93  Text Interpretation: Sinus rhythm Borderline right axis deviation Borderline repolarization abnormality Prolonged QT interval No significant change since last tracing Confirmed by Melene Plan [phone]) on 05/30/2023 11:08:22 PM  Radiology DG Chest Port 1 View  Result Date: 05/30/2023 CLINICAL DATA:  Cough EXAM: PORTABLE CHEST 1 VIEW COMPARISON:  02/20/2023 FINDINGS: Low lung volumes. No focal consolidation. No pleural effusion or pneumothorax. Mild cardiomegaly, chronic. IMPRESSION: Low lung volumes. No acute cardiopulmonary disease. Electronically Signed   By: Charline Bills M.D.   On: 05/30/2023 21:09  Procedures Procedures    Medications Ordered in ED Medications  metoCLOPramide (REGLAN) injection 10 mg (10 mg Intravenous Given 05/30/23 2111)  morphine (PF) 4 MG/ML injection 4 mg (4 mg Intravenous Given 05/30/23 2111)    ED Course/ Medical Decision Making/ A&P Clinical Course as of 05/30/23 2321  Sat May 30, 2023  2232 Hgb at baseline, mildly elevated lipase and mild AGAP. CT pending. [VK]  2320 Patient signed out to Dr. [name] pending CTAP and UA. [VK]    Clinical Course User Index [VK] Rexford Maus, DO                                 Medical Decision Making This patient presents to the ED with chief complaint(s) of abdominal pain, N/V/D with pertinent past medical history of ESRD, hypertension, spina bifida which further complicates the presenting complaint. The complaint involves an extensive differential diagnosis and also carries with it a high risk of complications and morbidity.    The differential diagnosis includes dehydration, electrolyte abnormality, gastroenteritis, viral syndrome, diverticulitis, appendicitis, UTI, other intra-abdominal infection  Additional history obtained: Additional history obtained from N/A Records reviewed Primary Care Documents  ED Course and Reassessment: On patient's arrival she is hemodynamically  stable in no acute distress.  Patient will have labs to evaluate for electrolyte derangement and cause of her pain.  She will have COVID swab.  She will need CT abdomen and pelvis and chest x-ray in the setting of her cough.  Will be given Reglan and morphine for symptomatic management and will be closely reassessed.  Independent labs interpretation:  The following labs were independently interpreted: mildly elevated AGAP, mildly elevated lipase, otherwise labs at baseline  Independent visualization of imaging: - I independently visualized the following imaging with scope of interpretation limited to determining acute life threatening conditions related to emergency care: CXR, which revealed no acute disease     Amount and/or Complexity of Data Reviewed Labs: ordered. Radiology: ordered.  Risk Prescription drug management.          Final Clinical Impression(s) / ED Diagnoses Final diagnoses:  None    Rx / DC Orders ED Discharge Orders     None         Rexford Maus, DO 05/30/23 2321

## 2023-05-30 NOTE — ED Triage Notes (Signed)
Pt BIB EMS from home. C/o of abd pain. N/V since Monday, Endorses dizziness that started at arrival  MWF dialysis   148/90, HR 88, T 99.7, cbg 80 (family reports this is her normal lowest cbg before pt usually crashes)

## 2023-05-30 NOTE — ED Notes (Signed)
EMS reports pt is 97% room air, but desats to 88% while sleeping suggests undiagnosed sleep apnea.

## 2023-05-31 ENCOUNTER — Other Ambulatory Visit: Payer: Self-pay

## 2023-05-31 ENCOUNTER — Encounter (HOSPITAL_COMMUNITY): Payer: Self-pay

## 2023-05-31 DIAGNOSIS — I10 Essential (primary) hypertension: Secondary | ICD-10-CM | POA: Diagnosis not present

## 2023-05-31 DIAGNOSIS — K5900 Constipation, unspecified: Secondary | ICD-10-CM | POA: Diagnosis not present

## 2023-05-31 DIAGNOSIS — R82998 Other abnormal findings in urine: Secondary | ICD-10-CM | POA: Diagnosis not present

## 2023-05-31 DIAGNOSIS — Z7401 Bed confinement status: Secondary | ICD-10-CM | POA: Diagnosis not present

## 2023-05-31 DIAGNOSIS — R1030 Lower abdominal pain, unspecified: Secondary | ICD-10-CM | POA: Diagnosis not present

## 2023-05-31 DIAGNOSIS — R1084 Generalized abdominal pain: Secondary | ICD-10-CM | POA: Diagnosis not present

## 2023-05-31 LAB — URINALYSIS, ROUTINE W REFLEX MICROSCOPIC
Bilirubin Urine: NEGATIVE
Glucose, UA: 50 mg/dL — AB
Ketones, ur: NEGATIVE mg/dL
Nitrite: NEGATIVE
Protein, ur: >=300 mg/dL — AB
Specific Gravity, Urine: 1.011 (ref 1.005–1.030)
pH: 9 — ABNORMAL HIGH (ref 5.0–8.0)

## 2023-05-31 LAB — SARS CORONAVIRUS 2 BY RT PCR: SARS Coronavirus 2 by RT PCR: NEGATIVE

## 2023-05-31 MED ORDER — PROMETHAZINE HCL 25 MG PO TABS: 25.0000 mg | ORAL_TABLET | Freq: Four times a day (QID) | ORAL | 0 refills | Status: DC | PRN

## 2023-05-31 MED ORDER — FOSFOMYCIN TROMETHAMINE 3 G PO PACK
3.0000 g | PACK | Freq: Once | ORAL | Status: AC
  Administered 2023-05-31: 3 g via ORAL
  Filled 2023-05-31 (×2): qty 3

## 2023-05-31 NOTE — Discharge Instructions (Addendum)
Please ask your nephrologist what cleanout protocol they recommend.  I would have you try MiraLAX 1 cap in 8 ounces of water every day until he had big bowel movement but usually will take about 3 to 4 days.

## 2023-05-31 NOTE — ED Notes (Signed)
PTAR at bedside for transport.  

## 2023-05-31 NOTE — ED Provider Notes (Signed)
Received patient in turnover from Dr. Theresia Lo.  Please see their note for further details of Hx, PE.  Briefly patient is a 32 y.o. female with a Abdominal Pain .  Patient with nausea vomiting abdominal pain.  Awaiting CT imaging.  CT with increased stool burden there is some concern for bladder wall thickening.  She is having dysuria.  She had a very small amounts of urine on In-N-Out cath.  This shows a lot of squamous cells, lab was concerned about the atypical squamous cells and recommended cytology.  Will send off.  With all the squamous cells  I worry that urine culture likely would not be not helpful.  I will give a single dose of fosfomycin here.  The patient is feeling much better and tolerating by mouth without issue.  The CT scan was concerning for increased stool burden.  I discussed this with the patient.  Will have her try a laxative.    Melene Plan, DO 05/31/23 [phone]

## 2023-06-01 DIAGNOSIS — Z992 Dependence on renal dialysis: Secondary | ICD-10-CM | POA: Diagnosis not present

## 2023-06-01 DIAGNOSIS — N186 End stage renal disease: Secondary | ICD-10-CM | POA: Diagnosis not present

## 2023-06-01 DIAGNOSIS — N2581 Secondary hyperparathyroidism of renal origin: Secondary | ICD-10-CM | POA: Diagnosis not present

## 2023-06-02 ENCOUNTER — Ambulatory Visit: Payer: Medicare Other | Admitting: Neurology

## 2023-06-02 LAB — CYTOLOGY - NON PAP

## 2023-06-03 DIAGNOSIS — N186 End stage renal disease: Secondary | ICD-10-CM | POA: Diagnosis not present

## 2023-06-03 DIAGNOSIS — Z992 Dependence on renal dialysis: Secondary | ICD-10-CM | POA: Diagnosis not present

## 2023-06-03 DIAGNOSIS — N2581 Secondary hyperparathyroidism of renal origin: Secondary | ICD-10-CM | POA: Diagnosis not present

## 2023-06-05 DIAGNOSIS — Z992 Dependence on renal dialysis: Secondary | ICD-10-CM | POA: Diagnosis not present

## 2023-06-05 DIAGNOSIS — N2581 Secondary hyperparathyroidism of renal origin: Secondary | ICD-10-CM | POA: Diagnosis not present

## 2023-06-05 DIAGNOSIS — N186 End stage renal disease: Secondary | ICD-10-CM | POA: Diagnosis not present

## 2023-06-08 DIAGNOSIS — Z992 Dependence on renal dialysis: Secondary | ICD-10-CM | POA: Diagnosis not present

## 2023-06-08 DIAGNOSIS — N186 End stage renal disease: Secondary | ICD-10-CM | POA: Diagnosis not present

## 2023-06-08 DIAGNOSIS — N2581 Secondary hyperparathyroidism of renal origin: Secondary | ICD-10-CM | POA: Diagnosis not present

## 2023-06-09 ENCOUNTER — Ambulatory Visit: Payer: Medicare Other | Admitting: Neurology

## 2023-06-10 DIAGNOSIS — N186 End stage renal disease: Secondary | ICD-10-CM | POA: Diagnosis not present

## 2023-06-10 DIAGNOSIS — N2581 Secondary hyperparathyroidism of renal origin: Secondary | ICD-10-CM | POA: Diagnosis not present

## 2023-06-10 DIAGNOSIS — Z992 Dependence on renal dialysis: Secondary | ICD-10-CM | POA: Diagnosis not present

## 2023-06-12 DIAGNOSIS — N2581 Secondary hyperparathyroidism of renal origin: Secondary | ICD-10-CM | POA: Diagnosis not present

## 2023-06-12 DIAGNOSIS — Z992 Dependence on renal dialysis: Secondary | ICD-10-CM | POA: Diagnosis not present

## 2023-06-12 DIAGNOSIS — N186 End stage renal disease: Secondary | ICD-10-CM | POA: Diagnosis not present

## 2023-06-15 DIAGNOSIS — N186 End stage renal disease: Secondary | ICD-10-CM | POA: Diagnosis not present

## 2023-06-15 DIAGNOSIS — Z992 Dependence on renal dialysis: Secondary | ICD-10-CM | POA: Diagnosis not present

## 2023-06-15 DIAGNOSIS — N2581 Secondary hyperparathyroidism of renal origin: Secondary | ICD-10-CM | POA: Diagnosis not present

## 2023-06-16 ENCOUNTER — Encounter (HOSPITAL_COMMUNITY): Payer: Self-pay | Admitting: Cardiology

## 2023-06-16 ENCOUNTER — Other Ambulatory Visit (HOSPITAL_COMMUNITY): Payer: Self-pay

## 2023-06-16 ENCOUNTER — Emergency Department (HOSPITAL_COMMUNITY)
Admission: EM | Admit: 2023-06-16 | Discharge: 2023-06-16 | Discharge status: Against Medical Advice | Payer: Medicare Other | Source: Home / Self Care

## 2023-06-16 ENCOUNTER — Ambulatory Visit (HOSPITAL_COMMUNITY)
Admission: RE | Admit: 2023-06-16 | Discharge: 2023-06-16 | Disposition: A | Discharge status: Home / Self Care | Payer: Medicare Other | Source: Ambulatory Visit | Attending: Cardiology | Admitting: Cardiology

## 2023-06-16 VITALS — BP 120/80 | HR 94

## 2023-06-16 DIAGNOSIS — I429 Cardiomyopathy, unspecified: Secondary | ICD-10-CM | POA: Insufficient documentation

## 2023-06-16 DIAGNOSIS — N186 End stage renal disease: Secondary | ICD-10-CM | POA: Insufficient documentation

## 2023-06-16 DIAGNOSIS — I132 Hypertensive heart and chronic kidney disease with heart failure and with stage 5 chronic kidney disease, or end stage renal disease: Secondary | ICD-10-CM | POA: Insufficient documentation

## 2023-06-16 DIAGNOSIS — Q059 Spina bifida, unspecified: Secondary | ICD-10-CM | POA: Insufficient documentation

## 2023-06-16 DIAGNOSIS — I5032 Chronic diastolic (congestive) heart failure: Secondary | ICD-10-CM | POA: Diagnosis not present

## 2023-06-16 DIAGNOSIS — Z992 Dependence on renal dialysis: Secondary | ICD-10-CM | POA: Insufficient documentation

## 2023-06-16 DIAGNOSIS — R0609 Other forms of dyspnea: Secondary | ICD-10-CM | POA: Diagnosis not present

## 2023-06-16 DIAGNOSIS — Z993 Dependence on wheelchair: Secondary | ICD-10-CM | POA: Insufficient documentation

## 2023-06-16 DIAGNOSIS — I050 Rheumatic mitral stenosis: Secondary | ICD-10-CM | POA: Diagnosis not present

## 2023-06-16 DIAGNOSIS — R0683 Snoring: Secondary | ICD-10-CM | POA: Diagnosis not present

## 2023-06-16 DIAGNOSIS — I272 Pulmonary hypertension, unspecified: Secondary | ICD-10-CM | POA: Insufficient documentation

## 2023-06-16 NOTE — Patient Instructions (Addendum)
No changes to medications.  Your physician has requested that you have an echocardiogram. Echocardiography is a painless test that uses sound waves to create images of your heart. It provides your doctor with information about the size and shape of your heart and how well your heart's chambers and valves are working. This procedure takes approximately one hour. There are no restrictions for this procedure. Please do NOT wear cologne, perfume, aftershave, or lotions (deodorant is allowed). Please arrive 15 minutes prior to your appointment time.  Please note: We ask at that you not bring children with you during ultrasound (echo/ vascular) testing. Due to room size and safety concerns, children are not allowed in the ultrasound rooms during exams. Our front office staff cannot provide observation of children in our lobby area while testing is being conducted. An adult accompanying a patient to their appointment will only be allowed in the ultrasound room at the discretion of the ultrasound technician under special circumstances. We apologize for any inconvenience.  You are scheduled for a Cardiac Catheterization on Tuesday, December 3 with Dr. Marca Ancona.  1. Please arrive at the [facility] (Main Entrance A) at [facility]: [address] [place], [place] [address] at 8:30 AM (This time is 2 hour(s) before your procedure to ensure your preparation).   Free valet parking service is available. You will check in at ADMITTING. The support person will be asked to wait in the waiting room.  It is OK to have someone drop you off and come back when you are ready to be discharged.    Special note: Every effort is made to have your procedure done on time. Please understand that emergencies sometimes delay scheduled procedures.  2. Diet: Do not eat solid foods after midnight.  The patient may have clear liquids until 5am upon the day of the procedure.  3.  Medication instructions in preparation for  your procedure:   Contrast Allergy: No  On the morning of your procedure, take any morning medicines NOT listed above.  You may use sips of water.  5. Plan to go home the same day, you will only stay overnight if medically necessary. 6. Bring a current list of your medications and current insurance cards. 7. You MUST have a responsible person to drive you home. 8. Someone MUST be with you the first 24 hours after you arrive home or your discharge will be delayed. 9. Please wear clothes that are easy to get on and off and wear slip-on shoes.  Your physician recommends that you schedule a follow-up appointment in: 8 weeks. ** PLEASE CALL THE OFFICE IN EARLY JANUARY TO ARRANGE YOUR FOLLOW UP APPOINTMENT. **  If you have any questions or concerns before your next appointment please send [place] a message through [place] or call our office at [phone].    TO LEAVE A MESSAGE FOR THE NURSE SELECT OPTION 2, PLEASE LEAVE A MESSAGE INCLUDING: YOUR NAME DATE OF BIRTH CALL BACK NUMBER REASON FOR CALL**this is important as we prioritize the call backs  YOU WILL RECEIVE A CALL BACK THE SAME DAY AS LONG AS YOU CALL BEFORE 4:00 PM  At the [facility], you and your health needs are our priority. As part of our continuing mission to provide you with exceptional heart care, we have created designated Provider Care Teams. These Care Teams include your primary Cardiologist (physician) and Advanced Practice Providers (APPs- Physician Assistants and Nurse Practitioners) who all work together to provide you  with the care you need, when you need it.   You may see any of the following providers on your designated Care Team at your next follow up: Dr Arvilla Meres Dr Marca Ancona Dr. Dorthula Nettles Dr. Clearnce Hasten Amy Filbert Schilder, NP Robbie Lis, [place] [facility] [place], [place] Brynda Peon, NP [place] Lee, NP Karle Plumber, PharmD   Please be sure to bring in all your  medications bottles to every appointment.    Thank you for choosing [facility]

## 2023-06-17 DIAGNOSIS — N2581 Secondary hyperparathyroidism of renal origin: Secondary | ICD-10-CM | POA: Diagnosis not present

## 2023-06-17 DIAGNOSIS — Z992 Dependence on renal dialysis: Secondary | ICD-10-CM | POA: Diagnosis not present

## 2023-06-17 DIAGNOSIS — N186 End stage renal disease: Secondary | ICD-10-CM | POA: Diagnosis not present

## 2023-06-17 NOTE — Progress Notes (Addendum)
PCP: Park Meo, FNP HF Cardiology: Dr. Shirlee Latch  32 y.o. with history of ESRD, spina bifida with caudal regression syndrome, and RV failure/mitral stenosis was referred by Dr. Jacques Navy for evaluation of RV failure.  Patient is wheelchair-bound and unable to walk.  She transfers from chair to bed, etc.  She has been on dialysis since age 86.  In 12/23, she had an echo showing EF 70-75%, moderate concentric LVH, mild RV enlargement with low normal RV systolic function, D-shaped septum suggestive of RV pressure/volume overload, PASP 85 mmHg, severe biatrial enlargement, abnormal MV with mild MR and moderate-severe mitral stenosis mean gradient 9 mmHg, mod-severe TR, narrow LV outflow tract with peak gradient 14 mmHg. She saw Dr. Jacques Navy with concern for significant mitral stenosis and RV failure.  Of note, she reports no known history of rheumatic fever as a child.   Patient has had trouble with volume overload.  Nephrology has tried to increase fluid removal but she cramps and gets nauseated with increased ultrafiltration so they have been unable to successfully lower her dry weight.  Patient has been short of breath with transfers for about 2 years, gradually progressive.  Mild dyspnea with dressing and bathing.  No chest pain.  No orthopnea, but per her husband, she gasps in her sleep.  No recent syncope or lightheadedness.   ECG (personally reviewed): NSR, RVH  PMH: 1. ESRD: Since 32 years old.  2. HTN 3. Spina bifida with caudal regression syndrome 4. GERD 5. Chronic diastolic CHF with RV failure: Echo (12/23) with EF 70-75%, moderate concentric LVH, mild RV enlargement with low normal RV systolic function, D-shaped septum suggestive of RV pressure/volume overload, PASP 85 mmHg, severe biatrial enlargement, abnormal MV with mild MR and moderate-severe mitral stenosis mean gradient 9 mmHg, mod-severe TR, narrow LV outflow tract with peak gradient 14 mmHg.  6. Mitral stenosis: No history of  rheumatic fever.  Echo in 12/23 with abnormal MV with mild MR and moderate-severe mitral stenosis mean gradient 9 mmHg.   SH: Married, lives in [place], no smoking or ETOH.   Family History  Problem Relation Age of Onset   Kidney cancer Other    Hypertension Maternal Grandmother    Arthritis Maternal Grandmother    Breast cancer Maternal Aunt    Colon cancer Neg Hx    ROS: All systems reviewed and negative except as per HPI.   Current Outpatient Medications  Medication Sig Dispense Refill   acetaminophen (TYLENOL) 500 MG tablet Take 1,000 mg by mouth every 6 (six) hours as needed for mild pain or headache.     albuterol (VENTOLIN HFA) 108 (90 Base) MCG/ACT inhaler Inhale 1-2 puffs into the lungs every 6 (six) hours as needed for wheezing or shortness of breath. 1 each 0   AURYXIA 1 GM 210 MG(Fe) tablet Take 630 mg by mouth 3 (three) times daily with meals.  3   B Complex-C-Folic Acid (DIALYVITE TABLET) TABS Take 1 tablet by mouth daily.     diphenhydrAMINE (BENADRYL) 25 mg capsule Take 25 mg by mouth daily as needed for allergies.     EPINEPHrine 0.3 mg/0.3 mL IJ SOAJ injection Inject 0.3 mg into the muscle as needed for anaphylaxis. 2 each 1   hydrALAZINE (APRESOLINE) 25 MG tablet Take 25 mg by mouth 3 (three) times daily.     metoprolol succinate (TOPROL-XL) 100 MG 24 hr tablet Take 100 mg by mouth daily.     omeprazole (PRILOSEC) 40 MG capsule Take 40 mg by  mouth every morning.     ondansetron (ZOFRAN) 4 MG tablet Take 1 tablet (4 mg total) by mouth every 6 (six) hours as needed for nausea or vomiting. 12 tablet 0   ondansetron (ZOFRAN-ODT) 4 MG disintegrating tablet Take 1 tablet (4 mg total) by mouth every 8 (eight) hours as needed. 20 tablet 0   promethazine (PHENERGAN) 25 MG tablet Take 1 tablet (25 mg total) by mouth every 6 (six) hours as needed for nausea or vomiting. 10 tablet 0   sodium zirconium cyclosilicate (LOKELMA) 5 g packet Take 10 g by mouth every Monday, Wednesday,  and Friday.     No current facility-administered medications for this encounter.   BP 120/80   Pulse 94   SpO2 95%  General: NAD Neck: JVP 14-16, no thyromegaly or thyroid nodule.  Lungs: Clear to auscultation bilaterally with normal respiratory effort. CV: Nondisplaced PMI.  Heart regular S1/S2, no S3/S4, 2/6 SEM RUSB.  No peripheral edema.  No carotid bruit.  Normal pedal pulses.  Abdomen: Soft, nontender, no hepatosplenomegaly, no distention.  Skin: Intact without lesions or rashes.  Neurologic: Alert and oriented x 3.  Psych: Normal affect. Extremities: Abnormal LEs due to spina bifida.  HEENT: Normal.   Assessment/Plan: 1. Chronic diastolic CHF/RV failure: Echo (12/23) showed EF 70-75%, moderate concentric LVH, mild RV enlargement with low normal RV systolic function, D-shaped septum suggestive of RV pressure/volume overload, PASP 85 mmHg, severe biatrial enlargement, abnormal MV with mild MR and moderate-severe mitral stenosis mean gradient 9 mmHg, mod-severe TR, narrow LV outflow tract with peak gradient 14 mmHg.  It is possible that moderate-severe mitral stenosis is the main trigger for pulmonary hypertension  and RV failure.  She is volume overloaded on exam with probably NYHA class III symptoms.  - She needs more fluid off at HD, but has not been able to successfully lower dry weight due to cramping and nausea with HD.  I would consider increasing her to 4 days/week of dialysis.  - We need to investigate her mitral stenosis further, is this the main driver for her CHF? 2. 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  I do not think that she would be a candidate for surgical mitral valve replacement.  She could be a valvuloplasty candidate if valve has the right morphology.  - Repeat echo to reassess mitral valve.  - I will arrange for hemodynamic RHC/LHC to assess filling pressures, PA pressure, and mitral stenosis severity. Discussed  risks/benefits and she agrees to procedure.  3. Suspect OSA: Gasps in sleep, snores.   - I will arrange for sleep study.   Followup post-cath  Marca Ancona 06/17/2023

## 2023-06-17 NOTE — H&P (View-Only) (Signed)
 PCP: Park Meo, FNP HF Cardiology: Dr. Shirlee Latch  32 y.o. with history of ESRD, spina bifida with caudal regression syndrome, and RV failure/mitral stenosis was referred by Dr. Jacques Navy for evaluation of RV failure.  Patient is wheelchair-bound and unable to walk.  She transfers from chair to bed, etc.  She has been on dialysis since age 86.  In 12/23, she had an echo showing EF 70-75%, moderate concentric LVH, mild RV enlargement with low normal RV systolic function, D-shaped septum suggestive of RV pressure/volume overload, PASP 85 mmHg, severe biatrial enlargement, abnormal MV with mild MR and moderate-severe mitral stenosis mean gradient 9 mmHg, mod-severe TR, narrow LV outflow tract with peak gradient 14 mmHg. She saw Dr. Jacques Navy with concern for significant mitral stenosis and RV failure.  Of note, she reports no known history of rheumatic fever as a child.   Patient has had trouble with volume overload.  Nephrology has tried to increase fluid removal but she cramps and gets nauseated with increased ultrafiltration so they have been unable to successfully lower her dry weight.  Patient has been short of breath with transfers for about 2 years, gradually progressive.  Mild dyspnea with dressing and bathing.  No chest pain.  No orthopnea, but per her husband, she gasps in her sleep.  No recent syncope or lightheadedness.   ECG (personally reviewed): NSR, RVH  PMH: 1. ESRD: Since 32 years old.  2. HTN 3. Spina bifida with caudal regression syndrome 4. GERD 5. Chronic diastolic CHF with RV failure: Echo (12/23) with EF 70-75%, moderate concentric LVH, mild RV enlargement with low normal RV systolic function, D-shaped septum suggestive of RV pressure/volume overload, PASP 85 mmHg, severe biatrial enlargement, abnormal MV with mild MR and moderate-severe mitral stenosis mean gradient 9 mmHg, mod-severe TR, narrow LV outflow tract with peak gradient 14 mmHg.  6. Mitral stenosis: No history of  rheumatic fever.  Echo in 12/23 with abnormal MV with mild MR and moderate-severe mitral stenosis mean gradient 9 mmHg.   SH: Married, lives in [place], no smoking or ETOH.   Family History  Problem Relation Age of Onset   Kidney cancer Other    Hypertension Maternal Grandmother    Arthritis Maternal Grandmother    Breast cancer Maternal Aunt    Colon cancer Neg Hx    ROS: All systems reviewed and negative except as per HPI.   Current Outpatient Medications  Medication Sig Dispense Refill   acetaminophen (TYLENOL) 500 MG tablet Take 1,000 mg by mouth every 6 (six) hours as needed for mild pain or headache.     albuterol (VENTOLIN HFA) 108 (90 Base) MCG/ACT inhaler Inhale 1-2 puffs into the lungs every 6 (six) hours as needed for wheezing or shortness of breath. 1 each 0   AURYXIA 1 GM 210 MG(Fe) tablet Take 630 mg by mouth 3 (three) times daily with meals.  3   B Complex-C-Folic Acid (DIALYVITE TABLET) TABS Take 1 tablet by mouth daily.     diphenhydrAMINE (BENADRYL) 25 mg capsule Take 25 mg by mouth daily as needed for allergies.     EPINEPHrine 0.3 mg/0.3 mL IJ SOAJ injection Inject 0.3 mg into the muscle as needed for anaphylaxis. 2 each 1   hydrALAZINE (APRESOLINE) 25 MG tablet Take 25 mg by mouth 3 (three) times daily.     metoprolol succinate (TOPROL-XL) 100 MG 24 hr tablet Take 100 mg by mouth daily.     omeprazole (PRILOSEC) 40 MG capsule Take 40 mg by  mouth every morning.     ondansetron (ZOFRAN) 4 MG tablet Take 1 tablet (4 mg total) by mouth every 6 (six) hours as needed for nausea or vomiting. 12 tablet 0   ondansetron (ZOFRAN-ODT) 4 MG disintegrating tablet Take 1 tablet (4 mg total) by mouth every 8 (eight) hours as needed. 20 tablet 0   promethazine (PHENERGAN) 25 MG tablet Take 1 tablet (25 mg total) by mouth every 6 (six) hours as needed for nausea or vomiting. 10 tablet 0   sodium zirconium cyclosilicate (LOKELMA) 5 g packet Take 10 g by mouth every Monday, Wednesday,  and Friday.     No current facility-administered medications for this encounter.   BP 120/80   Pulse 94   SpO2 95%  General: NAD Neck: JVP 14-16, no thyromegaly or thyroid nodule.  Lungs: Clear to auscultation bilaterally with normal respiratory effort. CV: Nondisplaced PMI.  Heart regular S1/S2, no S3/S4, 2/6 SEM RUSB.  No peripheral edema.  No carotid bruit.  Normal pedal pulses.  Abdomen: Soft, nontender, no hepatosplenomegaly, no distention.  Skin: Intact without lesions or rashes.  Neurologic: Alert and oriented x 3.  Psych: Normal affect. Extremities: Abnormal LEs due to spina bifida.  HEENT: Normal.   Assessment/Plan: 1. Chronic diastolic CHF/RV failure: Echo (12/23) showed EF 70-75%, moderate concentric LVH, mild RV enlargement with low normal RV systolic function, D-shaped septum suggestive of RV pressure/volume overload, PASP 85 mmHg, severe biatrial enlargement, abnormal MV with mild MR and moderate-severe mitral stenosis mean gradient 9 mmHg, mod-severe TR, narrow LV outflow tract with peak gradient 14 mmHg.  It is possible that moderate-severe mitral stenosis is the main trigger for pulmonary hypertension  and RV failure.  She is volume overloaded on exam with probably NYHA class III symptoms.  - She needs more fluid off at HD, but has not been able to successfully lower dry weight due to cramping and nausea with HD.  I would consider increasing her to 4 days/week of dialysis.  - We need to investigate her mitral stenosis further, is this the main driver for her CHF? 2. 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  I do not think that she would be a candidate for surgical mitral valve replacement.  She could be a valvuloplasty candidate if valve has the right morphology.  - Repeat echo to reassess mitral valve.  - I will arrange for hemodynamic RHC/LHC to assess filling pressures, PA pressure, and mitral stenosis severity. Discussed  risks/benefits and she agrees to procedure.  3. Suspect OSA: Gasps in sleep, snores.   - I will arrange for sleep study.   Followup post-cath  Marca Ancona 06/17/2023

## 2023-06-19 DIAGNOSIS — N186 End stage renal disease: Secondary | ICD-10-CM | POA: Diagnosis not present

## 2023-06-19 DIAGNOSIS — N2581 Secondary hyperparathyroidism of renal origin: Secondary | ICD-10-CM | POA: Diagnosis not present

## 2023-06-19 DIAGNOSIS — Z992 Dependence on renal dialysis: Secondary | ICD-10-CM | POA: Diagnosis not present

## 2023-06-21 DIAGNOSIS — N2581 Secondary hyperparathyroidism of renal origin: Secondary | ICD-10-CM | POA: Diagnosis not present

## 2023-06-21 DIAGNOSIS — N186 End stage renal disease: Secondary | ICD-10-CM | POA: Diagnosis not present

## 2023-06-21 DIAGNOSIS — Z992 Dependence on renal dialysis: Secondary | ICD-10-CM | POA: Diagnosis not present

## 2023-06-23 DIAGNOSIS — N2581 Secondary hyperparathyroidism of renal origin: Secondary | ICD-10-CM | POA: Diagnosis not present

## 2023-06-23 DIAGNOSIS — Z992 Dependence on renal dialysis: Secondary | ICD-10-CM | POA: Diagnosis not present

## 2023-06-23 DIAGNOSIS — N186 End stage renal disease: Secondary | ICD-10-CM | POA: Diagnosis not present

## 2023-06-26 DIAGNOSIS — N186 End stage renal disease: Secondary | ICD-10-CM | POA: Diagnosis not present

## 2023-06-26 DIAGNOSIS — Z992 Dependence on renal dialysis: Secondary | ICD-10-CM | POA: Diagnosis not present

## 2023-06-26 DIAGNOSIS — N2581 Secondary hyperparathyroidism of renal origin: Secondary | ICD-10-CM | POA: Diagnosis not present

## 2023-06-28 DIAGNOSIS — Z992 Dependence on renal dialysis: Secondary | ICD-10-CM | POA: Diagnosis not present

## 2023-06-28 DIAGNOSIS — N186 End stage renal disease: Secondary | ICD-10-CM | POA: Diagnosis not present

## 2023-06-28 DIAGNOSIS — I120 Hypertensive chronic kidney disease with stage 5 chronic kidney disease or end stage renal disease: Secondary | ICD-10-CM | POA: Diagnosis not present

## 2023-06-29 DIAGNOSIS — Z992 Dependence on renal dialysis: Secondary | ICD-10-CM | POA: Diagnosis not present

## 2023-06-29 DIAGNOSIS — N186 End stage renal disease: Secondary | ICD-10-CM | POA: Diagnosis not present

## 2023-06-29 DIAGNOSIS — N2581 Secondary hyperparathyroidism of renal origin: Secondary | ICD-10-CM | POA: Diagnosis not present

## 2023-06-29 DIAGNOSIS — E875 Hyperkalemia: Secondary | ICD-10-CM | POA: Diagnosis not present

## 2023-06-30 ENCOUNTER — Ambulatory Visit (HOSPITAL_COMMUNITY)
Admission: RE | Admit: 2023-06-30 | Discharge: 2023-06-30 | Disposition: A | Discharge status: Home / Self Care | Payer: Medicare Other | Attending: Cardiology | Admitting: Cardiology

## 2023-06-30 ENCOUNTER — Encounter (HOSPITAL_COMMUNITY)
Admission: RE | Disposition: A | Discharge status: Home / Self Care | Payer: Self-pay | Source: Home / Self Care | Attending: Cardiology

## 2023-06-30 ENCOUNTER — Other Ambulatory Visit: Payer: Self-pay

## 2023-06-30 DIAGNOSIS — Z993 Dependence on wheelchair: Secondary | ICD-10-CM | POA: Diagnosis not present

## 2023-06-30 DIAGNOSIS — I272 Pulmonary hypertension, unspecified: Secondary | ICD-10-CM | POA: Diagnosis not present

## 2023-06-30 DIAGNOSIS — I2721 Secondary pulmonary arterial hypertension: Secondary | ICD-10-CM | POA: Insufficient documentation

## 2023-06-30 DIAGNOSIS — N186 End stage renal disease: Secondary | ICD-10-CM | POA: Diagnosis not present

## 2023-06-30 DIAGNOSIS — I5032 Chronic diastolic (congestive) heart failure: Secondary | ICD-10-CM | POA: Insufficient documentation

## 2023-06-30 DIAGNOSIS — I509 Heart failure, unspecified: Secondary | ICD-10-CM | POA: Diagnosis present

## 2023-06-30 DIAGNOSIS — I429 Cardiomyopathy, unspecified: Secondary | ICD-10-CM

## 2023-06-30 DIAGNOSIS — I050 Rheumatic mitral stenosis: Secondary | ICD-10-CM | POA: Diagnosis not present

## 2023-06-30 DIAGNOSIS — I10 Essential (primary) hypertension: Secondary | ICD-10-CM

## 2023-06-30 DIAGNOSIS — Q059 Spina bifida, unspecified: Secondary | ICD-10-CM | POA: Insufficient documentation

## 2023-06-30 DIAGNOSIS — I132 Hypertensive heart and chronic kidney disease with heart failure and with stage 5 chronic kidney disease, or end stage renal disease: Secondary | ICD-10-CM | POA: Diagnosis not present

## 2023-06-30 DIAGNOSIS — R931 Abnormal findings on diagnostic imaging of heart and coronary circulation: Secondary | ICD-10-CM | POA: Diagnosis not present

## 2023-06-30 HISTORY — PX: RIGHT HEART CATH: CATH118263

## 2023-06-30 LAB — CBC
HCT: 37.7 % (ref 36.0–46.0)
Hemoglobin: 11.3 g/dL — ABNORMAL LOW (ref 12.0–15.0)
MCH: 31.1 pg (ref 26.0–34.0)
MCHC: 30 g/dL (ref 30.0–36.0)
MCV: 103.9 fL — ABNORMAL HIGH (ref 80.0–100.0)
Platelets: 164 10*3/uL (ref 150–400)
RBC: 3.63 MIL/uL — ABNORMAL LOW (ref 3.87–5.11)
RDW: 17.2 % — ABNORMAL HIGH (ref 11.5–15.5)
WBC: 7.1 10*3/uL (ref 4.0–10.5)
nRBC: 0 % (ref 0.0–0.2)

## 2023-06-30 LAB — POCT I-STAT EG7
Acid-Base Excess: 3 mmol/L — ABNORMAL HIGH (ref 0.0–2.0)
Acid-Base Excess: 4 mmol/L — ABNORMAL HIGH (ref 0.0–2.0)
Bicarbonate: 27.7 mmol/L (ref 20.0–28.0)
Bicarbonate: 28.1 mmol/L — ABNORMAL HIGH (ref 20.0–28.0)
Calcium, Ion: 1.04 mmol/L — ABNORMAL LOW (ref 1.15–1.40)
Calcium, Ion: 1.08 mmol/L — ABNORMAL LOW (ref 1.15–1.40)
HCT: 37 % (ref 36.0–46.0)
HCT: 38 % (ref 36.0–46.0)
Hemoglobin: 12.6 g/dL (ref 12.0–15.0)
Hemoglobin: 12.9 g/dL (ref 12.0–15.0)
O2 Saturation: 68 %
O2 Saturation: 70 %
Potassium: 4.6 mmol/L (ref 3.5–5.1)
Potassium: 4.6 mmol/L (ref 3.5–5.1)
Sodium: 140 mmol/L (ref 135–145)
Sodium: 140 mmol/L (ref 135–145)
TCO2: 29 mmol/L (ref 22–32)
TCO2: 29 mmol/L (ref 22–32)
pCO2, Ven: 41.4 mm[Hg] — ABNORMAL LOW (ref 44–60)
pCO2, Ven: 42.1 mm[Hg] — ABNORMAL LOW (ref 44–60)
pH, Ven: 7.427 (ref 7.25–7.43)
pH, Ven: 7.439 — ABNORMAL HIGH (ref 7.25–7.43)
pO2, Ven: 35 mm[Hg] (ref 32–45)
pO2, Ven: 35 mm[Hg] (ref 32–45)

## 2023-06-30 LAB — HCG, QUANTITATIVE, PREGNANCY: hCG, Beta Chain, Quant, S: 2 m[IU]/mL (ref <5)

## 2023-06-30 SURGERY — RIGHT HEART CATH
Anesthesia: LOCAL

## 2023-06-30 MED ORDER — MIDAZOLAM HCL 2 MG/2ML IJ SOLN
INTRAMUSCULAR | Status: AC
  Filled 2023-06-30: qty 2

## 2023-06-30 MED ORDER — SODIUM CHLORIDE 0.9% FLUSH: 3.0000 mL | INTRAVENOUS | Status: DC | PRN

## 2023-06-30 MED ORDER — DIAZEPAM 2 MG PO TABS
2.0000 mg | ORAL_TABLET | Freq: Once | ORAL | Status: AC
  Administered 2023-06-30: 2 mg via ORAL

## 2023-06-30 MED ORDER — FENTANYL CITRATE (PF) 100 MCG/2ML IJ SOLN
INTRAMUSCULAR | Status: DC | PRN
  Administered 2023-06-30: 25 ug via INTRAVENOUS

## 2023-06-30 MED ORDER — FENTANYL CITRATE (PF) 100 MCG/2ML IJ SOLN
INTRAMUSCULAR | Status: AC
  Filled 2023-06-30: qty 2

## 2023-06-30 MED ORDER — ACETAMINOPHEN 325 MG PO TABS
650.0000 mg | ORAL_TABLET | ORAL | Status: DC | PRN
Start: 2023-06-30 — End: 2023-06-30
  Administered 2023-06-30: 650 mg via ORAL
  Filled 2023-06-30: qty 2

## 2023-06-30 MED ORDER — LIDOCAINE HCL (PF) 1 % IJ SOLN
INTRAMUSCULAR | Status: AC
  Filled 2023-06-30: qty 30

## 2023-06-30 MED ORDER — LIDOCAINE HCL (PF) 1 % IJ SOLN
INTRAMUSCULAR | Status: DC | PRN
  Administered 2023-06-30: 2 mL
  Administered 2023-06-30: 1 mL

## 2023-06-30 MED ORDER — ONDANSETRON HCL 4 MG PO TABS
4.0000 mg | ORAL_TABLET | Freq: Once | ORAL | Status: AC
  Administered 2023-06-30: 4 mg via ORAL
  Filled 2023-06-30: qty 1

## 2023-06-30 MED ORDER — HEPARIN (PORCINE) IN NACL 1000-0.9 UT/500ML-% IV SOLN
INTRAVENOUS | Status: DC | PRN
  Administered 2023-06-30: 500 mL

## 2023-06-30 MED ORDER — LABETALOL HCL 5 MG/ML IV SOLN: 10.0000 mg | INTRAVENOUS | Status: DC | PRN

## 2023-06-30 MED ORDER — SODIUM CHLORIDE 0.9 % IV SOLN: INTRAVENOUS | Status: DC

## 2023-06-30 MED ORDER — HYDRALAZINE HCL 20 MG/ML IJ SOLN: 10.0000 mg | INTRAMUSCULAR | Status: DC | PRN

## 2023-06-30 MED ORDER — ONDANSETRON HCL 4 MG/2ML IJ SOLN: 4.0000 mg | Freq: Four times a day (QID) | INTRAMUSCULAR | Status: DC | PRN

## 2023-06-30 MED ORDER — SODIUM CHLORIDE 0.9% FLUSH
3.0000 mL | Freq: Two times a day (BID) | INTRAVENOUS | Status: DC
Start: 2023-06-30 — End: 2023-06-30

## 2023-06-30 MED ORDER — SODIUM CHLORIDE 0.9 % IV SOLN: 250.0000 mL | INTRAVENOUS | Status: DC | PRN

## 2023-06-30 SURGICAL SUPPLY — 7 items
CATH BALLN WEDGE 5F 110CM (CATHETERS) IMPLANT
PACK CARDIAC CATHETERIZATION (CUSTOM PROCEDURE TRAY) ×1 IMPLANT
SHEATH GLIDE SLENDER 4/5FR (SHEATH) IMPLANT
SHEATH PROBE COVER 6X72 (BAG) IMPLANT
TRANSDUCER W/STOPCOCK (MISCELLANEOUS) IMPLANT
TUBING ART PRESS 72 MALE/FEM (TUBING) IMPLANT
WIRE MICROINTRODUCER 60CM (WIRE) IMPLANT

## 2023-06-30 NOTE — Interval H&P Note (Signed)
History and Physical Interval Note:  06/30/2023 11:18 AM  [name]  has presented today for surgery, with the diagnosis of heart failure.  The various methods of treatment have been discussed with the patient and family. After consideration of risks, benefits and other options for treatment, the patient has consented to  Procedure(s): RIGHT HEART CATH (N/A) as a surgical intervention.  The patient's history has been reviewed, patient examined, no change in status, stable for surgery.  I have reviewed the patient's chart and labs.  Questions were answered to the patient's satisfaction.     Morgen Linebaugh [employer]

## 2023-06-30 NOTE — Discharge Instructions (Signed)

## 2023-07-01 ENCOUNTER — Encounter (HOSPITAL_COMMUNITY): Payer: Self-pay | Admitting: Cardiology

## 2023-07-01 DIAGNOSIS — N186 End stage renal disease: Secondary | ICD-10-CM | POA: Diagnosis not present

## 2023-07-01 DIAGNOSIS — Z992 Dependence on renal dialysis: Secondary | ICD-10-CM | POA: Diagnosis not present

## 2023-07-01 DIAGNOSIS — E875 Hyperkalemia: Secondary | ICD-10-CM | POA: Diagnosis not present

## 2023-07-01 DIAGNOSIS — N2581 Secondary hyperparathyroidism of renal origin: Secondary | ICD-10-CM | POA: Diagnosis not present

## 2023-07-02 ENCOUNTER — Ambulatory Visit (INDEPENDENT_AMBULATORY_CARE_PROVIDER_SITE_OTHER): Payer: Medicare Other | Admitting: Allergy

## 2023-07-02 ENCOUNTER — Ambulatory Visit: Payer: Medicare Other | Admitting: Allergy

## 2023-07-02 ENCOUNTER — Encounter: Payer: Self-pay | Admitting: Allergy

## 2023-07-02 VITALS — BP 118/82 | HR 79 | Temp 98.1°F | Wt 70.1 lb

## 2023-07-02 DIAGNOSIS — T7800XD Anaphylactic reaction due to unspecified food, subsequent encounter: Secondary | ICD-10-CM | POA: Diagnosis not present

## 2023-07-02 DIAGNOSIS — H1013 Acute atopic conjunctivitis, bilateral: Secondary | ICD-10-CM

## 2023-07-02 DIAGNOSIS — L2089 Other atopic dermatitis: Secondary | ICD-10-CM

## 2023-07-02 DIAGNOSIS — J310 Chronic rhinitis: Secondary | ICD-10-CM | POA: Diagnosis not present

## 2023-07-02 DIAGNOSIS — T50905D Adverse effect of unspecified drugs, medicaments and biological substances, subsequent encounter: Secondary | ICD-10-CM

## 2023-07-02 DIAGNOSIS — H109 Unspecified conjunctivitis: Secondary | ICD-10-CM

## 2023-07-02 DIAGNOSIS — Z888 Allergy status to other drugs, medicaments and biological substances status: Secondary | ICD-10-CM | POA: Diagnosis not present

## 2023-07-02 DIAGNOSIS — J4520 Mild intermittent asthma, uncomplicated: Secondary | ICD-10-CM | POA: Diagnosis not present

## 2023-07-02 NOTE — Patient Instructions (Addendum)
Food Allergies Multiple reported food allergies including eggs, peanuts, tree nuts, and coconut. Recent reaction to eggs causing vomiting, but tolerance to eggs in baked goods. Childhood reaction to peanuts, but recent accidental exposure without reaction. Avoidance of tree nuts and coconut due to peanut allergy. -Order IgE blood testing for eggs, peanuts, tree nuts, and coconut. -Consider food challenge in office if testing results are low or negative.  Environmental Allergies Reports of allergies to pollen and certain dogs causing sneezing, runny nose, itchy watery eyes, and red patches on skin. -Order IgE blood testing for environmental allergens -Consider daily antihistamine to minimize reactivity to environmental triggers -  Can use either Claritin 10mg  every 48 hours OR Allegra 60mg  every 24 hours  (these are recommended doses for hemodialysis 3 times week) -Can use nasal spray like Astelin 1-2 sprays each nostril twice daily as needed for runny nose control.  Medication Allergies Reported reactions to Zofran via IV causing arm welting and pain, but tolerance to oral form. Reported reaction to dialyzer agent used in dialysis causing facial swelling and itching.  No reactions to antibiotics (ciprofloxacin and ceftriaxone) do not appear to be IgE mediated drug reactions either.  Symptoms following vaccine of tetanus and influenza also did not appear to be IgE mediated symptoms. -No testing available for these reactions that do not appear to be IgE mediated. Continue current management strategies including premedication with Benadryl for dialysis if necessary.  Eczema Reports of dry, itchy patches on hands, likely related to frequent hand washing. -Continue use of cocoa butter for moisturization. -Consider use of over-the-counter hydrocortisone cream if symptoms worsen.  Asthma Use of albuterol inhaler approximately twice a month for cough and difficulty breathing. -Continue use of  albuterol inhaler as needed.  Follow-up Await results of IgE blood testing. Based on results, consider food challenge in office and initiation of daily antihistamine for environmental allergies.

## 2023-07-02 NOTE — Progress Notes (Signed)
New Patient Note  RE: [name] MRN: [id number] DOB: [birth year] Date of Office Visit: 07/02/2023  Primary care provider: Park Meo, FNP  Chief Complaint: Allergies  History of present illness: [name] is a 32 y.o. female presenting today for evaluation of allergic reaction.  She presents today with her husband.  The patient, with a history of multiple allergies and undergoing dialysis 3 times a week, presents with concerns about allergic reactions to various substances. She reports a recent development of an allergy to eggs, which causes vomiting within 30 seconds of consumption, specifically when the eggs are scrambled. However, she tolerates eggs in other forms such as in cakes or when deviled.  The patient also reports a childhood allergy to peanuts, but she has not consumed any since then and is unsure if the allergy persists. She had a severe allergic reaction to nuts as a child while in the hospital, which caused her throat to swell and itchiness.  She avoids tree nuts due to the peanut allergy. She also avoids foods containing red dyes, which cause her to vomit.  In addition to food allergies, the patient experienced allergic reactions during dialysis to the Revaclear dialyzer, causing her face to swell and turn red, and become itchy. She reports that her dialysis center is working on getting hypoallergenic dialyzers, but in the meantime, they rinse the dialyzer multiple times before use. Despite this, she sometimes still requires Benadryl during treatment. She also reports that intravenous Zofran causes her arm to welt up and become painful, but she can tolerate the oral form. She has a history of reactions to several vaccines, including influenza and tetanus, which resulted in high fevers and nausea and vomiting requiring hospitalization. She also avoids Aleve due to a previous gastrointestinal bleed.  She reports only using Tylenol as needed.    The patient  also reports environmental allergies, specifically to pollen and dogs that shed heavily. These allergies cause sneezing, red patches, and a runny, stuffy nose. She manages these symptoms with Benadryl, which she has taken twice in the past month.  The patient has an EpiPen but has not needed to use it.    The patient also reports a potential development of eczema on her hands, which she attributes to frequent washing. She manages this with cocoa butter.   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   Depression 05/14/2015   Dialysis care    ESRD (end stage renal disease) on dialysis [facility])    GERD (gastroesophageal reflux disease) 01/07/2017   HTN (hypertension) 05/02/2011   Murmur, cardiac 07/17/2022   Spina bifida    UTI (lower urinary tract infection)    Viral URI with cough 09/15/2022    Past surgical history: Past Surgical History:  Procedure Laterality Date   A/V FISTULAGRAM Left 10/14/2021   Procedure: A/V Fistulagram;  Surgeon: Maeola Harman, MD;  Location: [facility] INVASIVE CV LAB;  Service: Cardiovascular;  Laterality: Left;   AV FISTULA PLACEMENT     left arm   INSERTION OF DIALYSIS CATHETER N/A 05/08/2020   Procedure: ATTEMPTED, UNSUCCESSFUL INSERTION OF DIALYSIS CATHETER TUNNELED RIGHT INTERNAL JUGULAR;  Surgeon: Lucretia Roers, MD;  Location: [facility];  Service: General;  Laterality: N/A;   IR FLUORO GUIDE CV LINE RIGHT  05/09/2020   IR US GUIDE VASC ACCESS RIGHT  05/09/2020   RIGHT HEART CATH N/A 06/30/2023   Procedure: RIGHT HEART CATH;  Surgeon: Laurey Morale, MD;  Location: [facility] INVASIVE CV LAB;  Service: Cardiovascular;  Laterality: N/A;    Family history:  Family History  Problem Relation Age  of Onset   Kidney cancer Other    Hypertension Maternal Grandmother    Arthritis Maternal Grandmother    Breast cancer Maternal Aunt    Colon cancer Neg Hx     Social history: Lives in a home with carpeting in the bedroom with electric heating and central cooling.  No pets in the home.  Cats outside the home.  No concern for water damage, mildew or roaches in the home.  She is disabled.  Denies a current smoking history.    Medication List: Current Outpatient Medications  Medication Sig Dispense Refill   acetaminophen (TYLENOL) 500 MG tablet Take 1,000 mg by mouth every 6 (six) hours as needed for mild pain or headache.     albuterol (VENTOLIN HFA) 108 (90 Base) MCG/ACT inhaler Inhale 1-2 puffs into the lungs every 6 (six) hours as needed for wheezing or shortness of breath. 1 each 0   AURYXIA 1 GM 210 MG(Fe) tablet Take 420 mg by mouth 2 (two) times daily with a meal.  3   B Complex-C-Folic Acid (DIALYVITE TABLET) TABS Take 1 tablet by mouth daily.     diphenhydrAMINE (BENADRYL) 25 mg capsule Take 25 mg by mouth daily as needed for allergies.     EPINEPHrine 0.3 mg/0.3 mL IJ SOAJ injection Inject 0.3 mg into the muscle as needed for anaphylaxis. 2 each 1   hydrALAZINE (APRESOLINE) 25 MG tablet Take 25 mg by mouth 3 (three) times daily.     metoprolol succinate (TOPROL-XL) 100 MG 24 hr tablet Take 100 mg by mouth daily.     omeprazole (PRILOSEC) 40 MG capsule Take 40 mg by mouth every morning.     ondansetron (ZOFRAN-ODT) 4 MG disintegrating tablet Take 1 tablet (4 mg total) by mouth every 8 (eight) hours as needed. 20 tablet 0   sodium zirconium cyclosilicate (LOKELMA) 5 g packet Take 10 g by mouth every Monday, Wednesday, and Friday.     No current facility-administered medications for this visit.    Known medication allergies: Allergies  Allergen Reactions   Ciprofloxacin Shortness Of Breath, Nausea And Vomiting and Other (See Comments)    HIGH FEVER and oral blisters    Other  Anaphylaxis    Revaclear dialzer   Peanut-Containing Drug Products Anaphylaxis   Aleve [Naproxen Sodium] Other (See Comments)    G.I.Bleed   Ceftriaxone Other (See Comments)    Blisters in mouth    Coconut (Cocos Nucifera) Hives   Influenza Vaccines Nausea And Vomiting and Other (See Comments)    High fever   Tetanus Toxoid, Adsorbed Nausea And Vomiting and Other (See Comments)    HIGH FEVER, also   Tetanus Toxoids Nausea And Vomiting and Other (See Comments)    HIGH FEVER   Latex Itching and Rash     Physical examination: Blood pressure 118/82, pulse 79, temperature 98.1 F (36.7 C), weight 70 lb 1.7 oz (31.8 kg), SpO2 95%.  General: Alert, interactive, in no acute distress. HEENT: PERRLA, TMs pearly gray, turbinates mildly edematous without discharge, post-pharynx non erythematous. Neck: Supple without lymphadenopathy. Lungs: Clear to auscultation  without wheezing, rhonchi or rales. {no increased work of breathing. CV: Normal S1, S2 without murmurs. Abdomen: Nondistended, nontender. Skin: Several hyperpigmented macules over the arms, large dialysis fistula of the left upper arm .   Diagnositics/Labs: None today  Assessment and plan: Food Allergies Multiple reported food allergies including eggs, peanuts, tree nuts, and coconut. Recent reaction to eggs causing vomiting, but tolerance to eggs in baked goods. Childhood reaction to peanuts, but recent accidental exposure without reaction. Avoidance of tree nuts and coconut due to peanut allergy. -Order IgE blood testing for eggs, peanuts, tree nuts, and coconut. -Consider food challenge in office if testing results are low or negative.  Environmental Allergies Reports of allergies to pollen and certain dogs causing sneezing, runny nose, itchy watery eyes, and red patches on skin. -Order IgE blood testing for environmental allergens -Consider daily antihistamine to minimize reactivity to environmental triggers -  Can use  either Claritin 10mg  every 48 hours OR Allegra 60mg  every 24 hours  (these are recommended doses for hemodialysis 3 times week) -Can use nasal spray like Astelin 1-2 sprays each nostril twice daily as needed for runny nose control.  Medication Allergies Reported reactions to Zofran via IV causing arm welting and pain, but tolerance to oral form. Reported reaction to dialyzer agent used in dialysis causing facial swelling and itching.  No reactions to antibiotics (ciprofloxacin and ceftriaxone) do not appear to be IgE mediated drug reactions either.  Symptoms following vaccine of tetanus and influenza also did not appear to be IgE mediated symptoms. -No testing available for these reactions that do not appear to be IgE mediated. Continue current management strategies including premedication with Benadryl for dialysis if necessary.  Eczema Reports of dry, itchy patches on hands, likely related to frequent hand washing. -Continue use of cocoa butter for moisturization. -Consider use of over-the-counter hydrocortisone cream if symptoms worsen.  Asthma Use of albuterol inhaler approximately twice a month for cough and difficulty breathing. -Continue use of albuterol inhaler as needed.  Follow-up Await results of IgE blood testing. Based on results, consider food challenge in office and initiation of daily antihistamine for environmental allergies.  I appreciate the opportunity to take part in [name]'s care. Please do not hesitate to contact me with questions.  Sincerely,   Margo Aye, MD Allergy/Immunology [facility]

## 2023-07-03 DIAGNOSIS — E875 Hyperkalemia: Secondary | ICD-10-CM | POA: Diagnosis not present

## 2023-07-03 DIAGNOSIS — Z992 Dependence on renal dialysis: Secondary | ICD-10-CM | POA: Diagnosis not present

## 2023-07-03 DIAGNOSIS — N186 End stage renal disease: Secondary | ICD-10-CM | POA: Diagnosis not present

## 2023-07-03 DIAGNOSIS — N2581 Secondary hyperparathyroidism of renal origin: Secondary | ICD-10-CM | POA: Diagnosis not present

## 2023-07-06 DIAGNOSIS — E875 Hyperkalemia: Secondary | ICD-10-CM | POA: Diagnosis not present

## 2023-07-06 DIAGNOSIS — Z992 Dependence on renal dialysis: Secondary | ICD-10-CM | POA: Diagnosis not present

## 2023-07-06 DIAGNOSIS — N2581 Secondary hyperparathyroidism of renal origin: Secondary | ICD-10-CM | POA: Diagnosis not present

## 2023-07-06 DIAGNOSIS — N186 End stage renal disease: Secondary | ICD-10-CM | POA: Diagnosis not present

## 2023-07-06 LAB — IGE NUT PROF. W/COMPONENT RFLX

## 2023-07-07 LAB — ALLERGENS W/TOTAL IGE AREA 2
Alternaria Alternata IgE: 0.41 kU/L — AB
Aspergillus Fumigatus IgE: 0.47 kU/L — AB
Bermuda Grass IgE: 0.27 kU/L — AB
Cat Dander IgE: 0.33 kU/L — AB
Cedar, Mountain IgE: 0.13 kU/L — AB
Cladosporium Herbarum IgE: 0.53 kU/L — AB
Cockroach, German IgE: 0.14 kU/L — AB
Common Silver Birch IgE: 0.24 kU/L — AB
D Farinae IgE: 0.25 kU/L — AB
D Pteronyssinus IgE: 0.21 kU/L — AB
Dog Dander IgE: 2.87 kU/L — AB
IgE (Immunoglobulin E), Serum: 878 [IU]/mL — ABNORMAL HIGH (ref 6–495)
Johnson Grass IgE: 0.36 kU/L — AB
Maple/Box Elder IgE: 1.34 kU/L — AB
Oak, White IgE: 0.67 kU/L — AB
Penicillium Chrysogen IgE: 0.85 kU/L — AB
Timothy Grass IgE: 0.71 kU/L — AB

## 2023-07-07 LAB — PANEL 604726
Cor A 1 IgE: 0.46 kU/L — AB
Cor A 14 IgE: <0.1 kU/L
Cor A 8 IgE: <0.1 kU/L
Cor A 9 IgE: <0.1 kU/L

## 2023-07-07 LAB — IGE NUT PROF. W/COMPONENT RFLX
F017-IgE Hazelnut (Filbert): 0.35 kU/L — AB
Peanut, IgE: 1.96 kU/L — AB

## 2023-07-07 LAB — PEANUT COMPONENTS
F352-IgE Ara h 8: <0.1 kU/L
F422-IgE Ara h 1: <0.1 kU/L
F423-IgE Ara h 2: 3.17 kU/L — AB
F424-IgE Ara h 3: <0.1 kU/L
F427-IgE Ara h 9: <0.1 kU/L
F447-IgE Ara h 6: 0.5 kU/L — AB

## 2023-07-07 LAB — EGG COMPONENT PANEL: F232-IgE Ovalbumin: 0.2 kU/L — AB

## 2023-07-07 LAB — TRYPTASE: Tryptase: 18.1 ug/L — ABNORMAL HIGH (ref 2.2–13.2)

## 2023-07-07 LAB — ALLERGEN EGG WHITE F1: Egg White IgE: 0.16 kU/L — AB

## 2023-07-07 LAB — ALLERGEN COCONUT IGE

## 2023-07-07 LAB — ALLERGEN COMPONENT COMMENTS

## 2023-07-08 DIAGNOSIS — N2581 Secondary hyperparathyroidism of renal origin: Secondary | ICD-10-CM | POA: Diagnosis not present

## 2023-07-08 DIAGNOSIS — N186 End stage renal disease: Secondary | ICD-10-CM | POA: Diagnosis not present

## 2023-07-08 DIAGNOSIS — E875 Hyperkalemia: Secondary | ICD-10-CM | POA: Diagnosis not present

## 2023-07-08 DIAGNOSIS — Z992 Dependence on renal dialysis: Secondary | ICD-10-CM | POA: Diagnosis not present

## 2023-07-09 ENCOUNTER — Telehealth (HOSPITAL_COMMUNITY): Payer: Self-pay | Admitting: Cardiology

## 2023-07-09 ENCOUNTER — Encounter (HOSPITAL_COMMUNITY): Payer: Self-pay

## 2023-07-09 DIAGNOSIS — M79601 Pain in right arm: Secondary | ICD-10-CM

## 2023-07-09 DIAGNOSIS — I10 Essential (primary) hypertension: Secondary | ICD-10-CM

## 2023-07-09 NOTE — Telephone Encounter (Signed)
Pts significant other called to report pain and bruising at cath site L arm   Reports area is swollen was told by dialysis RN to call for evaluation   Please advise

## 2023-07-09 NOTE — Telephone Encounter (Signed)
Cath was done from right arm. She has a fistula on the left side. Please confirm pain location. If site of fistula then she needs to likely be referred to Vascular by her Nephrologist.  If pain is on right side and associated with a lot of swelling, she should have right upper extremity venous duplex to rule out DVT

## 2023-07-10 DIAGNOSIS — E875 Hyperkalemia: Secondary | ICD-10-CM | POA: Diagnosis not present

## 2023-07-10 DIAGNOSIS — N2581 Secondary hyperparathyroidism of renal origin: Secondary | ICD-10-CM | POA: Diagnosis not present

## 2023-07-10 DIAGNOSIS — N186 End stage renal disease: Secondary | ICD-10-CM | POA: Diagnosis not present

## 2023-07-10 DIAGNOSIS — Z992 Dependence on renal dialysis: Secondary | ICD-10-CM | POA: Diagnosis not present

## 2023-07-10 NOTE — Telephone Encounter (Signed)
S/O confirmed area of concern is on the R arm -pain/swelling -difficult to get around as pt is in wheelchair  Order for upper extremity venous duplex ordered Transport requires 72 hr notice and pt has dialysis on MWF

## 2023-07-13 DIAGNOSIS — E875 Hyperkalemia: Secondary | ICD-10-CM | POA: Diagnosis not present

## 2023-07-13 DIAGNOSIS — Z992 Dependence on renal dialysis: Secondary | ICD-10-CM | POA: Diagnosis not present

## 2023-07-13 DIAGNOSIS — N2581 Secondary hyperparathyroidism of renal origin: Secondary | ICD-10-CM | POA: Diagnosis not present

## 2023-07-13 DIAGNOSIS — N186 End stage renal disease: Secondary | ICD-10-CM | POA: Diagnosis not present

## 2023-07-13 IMAGING — DX DG CHEST 1V PORT
1 series · 1 of 1 positions shown · non-contrast
Comparison: 05/31/2021

CLINICAL DATA: Recent chills. Cough. History of pneumonia in
[REDACTED].

EXAM:
PORTABLE CHEST 1 VIEW

[chest ap]
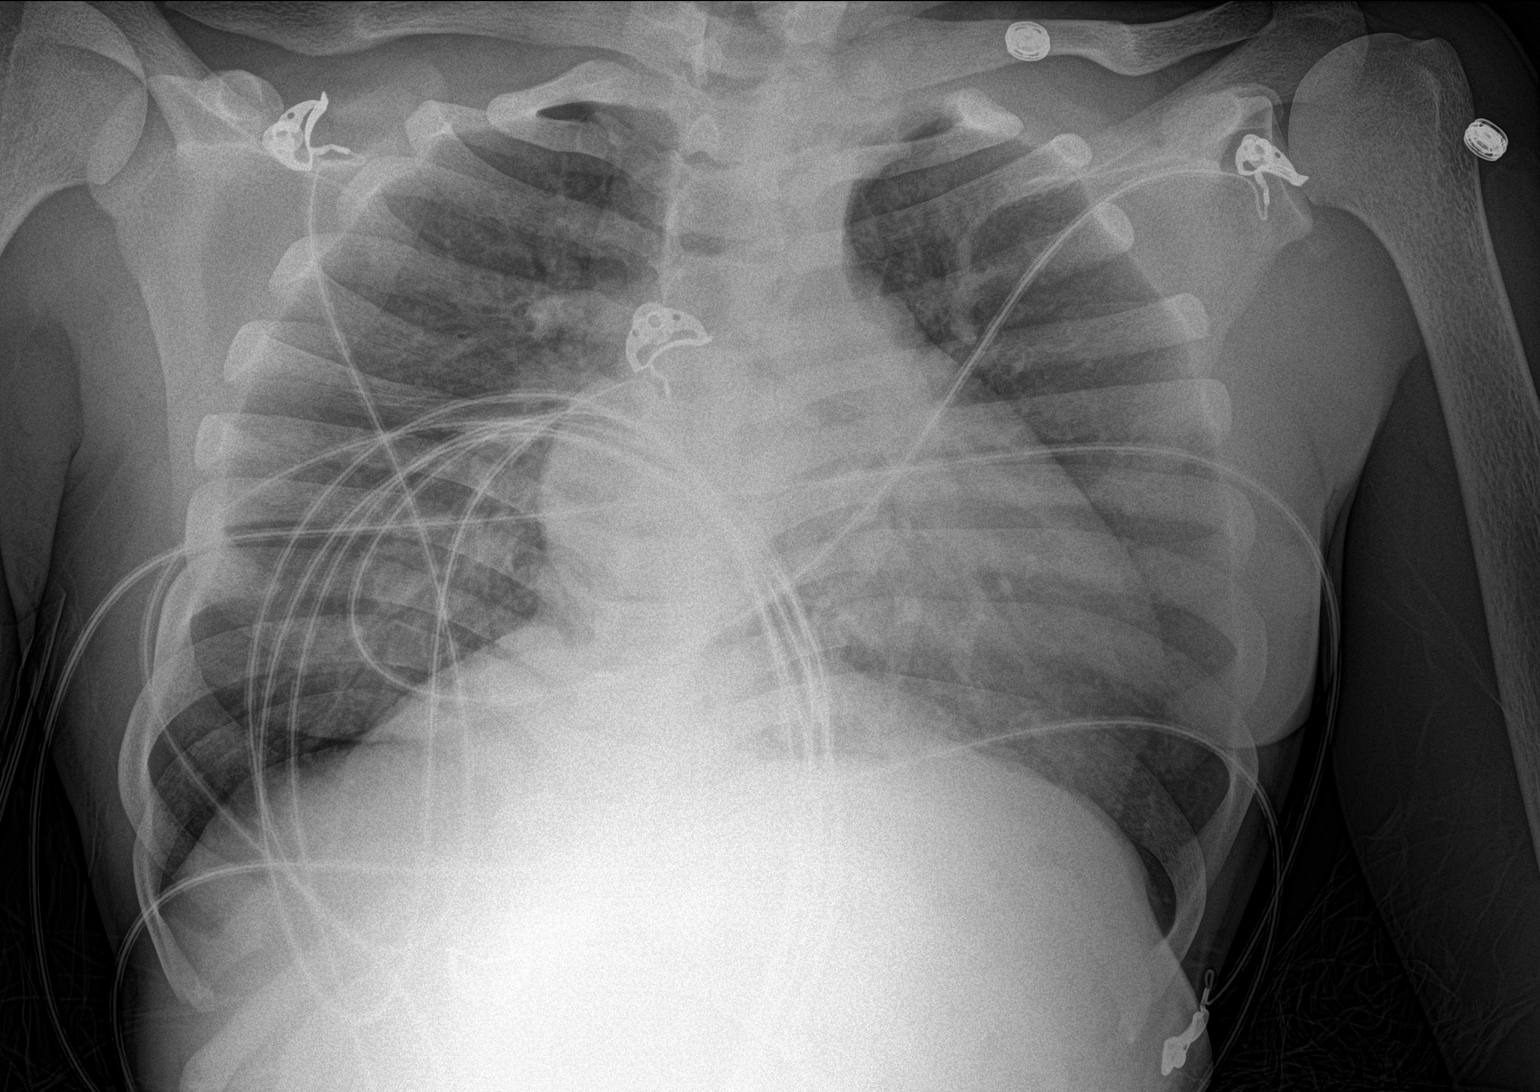

[1 of 1 positions shown; findings below may reference images not displayed]

FINDINGS: Stable cardiomediastinal contours. No pleural effusion or edema. No
airspace opacities identified. No acute osseous findings.
IMPRESSION: No acute cardiopulmonary abnormalities.

## 2023-07-15 ENCOUNTER — Emergency Department (HOSPITAL_COMMUNITY): Payer: Medicare Other

## 2023-07-15 ENCOUNTER — Encounter (HOSPITAL_COMMUNITY): Payer: Self-pay | Admitting: *Deleted

## 2023-07-15 ENCOUNTER — Observation Stay (HOSPITAL_COMMUNITY)
Admission: EM | Admit: 2023-07-15 | Discharge: 2023-07-16 | Disposition: A | Discharge status: Home / Self Care | Payer: Medicare Other | Attending: Internal Medicine | Admitting: Internal Medicine

## 2023-07-15 ENCOUNTER — Other Ambulatory Visit: Payer: Self-pay

## 2023-07-15 DIAGNOSIS — R0602 Shortness of breath: Secondary | ICD-10-CM | POA: Diagnosis not present

## 2023-07-15 DIAGNOSIS — J45901 Unspecified asthma with (acute) exacerbation: Principal | ICD-10-CM | POA: Diagnosis present

## 2023-07-15 DIAGNOSIS — Z992 Dependence on renal dialysis: Secondary | ICD-10-CM | POA: Diagnosis not present

## 2023-07-15 DIAGNOSIS — I361 Nonrheumatic tricuspid (valve) insufficiency: Secondary | ICD-10-CM | POA: Diagnosis not present

## 2023-07-15 DIAGNOSIS — E875 Hyperkalemia: Secondary | ICD-10-CM

## 2023-07-15 DIAGNOSIS — R0989 Other specified symptoms and signs involving the circulatory and respiratory systems: Secondary | ICD-10-CM | POA: Diagnosis not present

## 2023-07-15 DIAGNOSIS — Z1152 Encounter for screening for COVID-19: Secondary | ICD-10-CM | POA: Diagnosis not present

## 2023-07-15 DIAGNOSIS — J90 Pleural effusion, not elsewhere classified: Secondary | ICD-10-CM

## 2023-07-15 DIAGNOSIS — R918 Other nonspecific abnormal finding of lung field: Secondary | ICD-10-CM | POA: Diagnosis not present

## 2023-07-15 DIAGNOSIS — D631 Anemia in chronic kidney disease: Secondary | ICD-10-CM | POA: Diagnosis not present

## 2023-07-15 DIAGNOSIS — I5032 Chronic diastolic (congestive) heart failure: Secondary | ICD-10-CM | POA: Diagnosis not present

## 2023-07-15 DIAGNOSIS — I509 Heart failure, unspecified: Secondary | ICD-10-CM

## 2023-07-15 DIAGNOSIS — K219 Gastro-esophageal reflux disease without esophagitis: Secondary | ICD-10-CM | POA: Insufficient documentation

## 2023-07-15 DIAGNOSIS — F17210 Nicotine dependence, cigarettes, uncomplicated: Secondary | ICD-10-CM | POA: Insufficient documentation

## 2023-07-15 DIAGNOSIS — I342 Nonrheumatic mitral (valve) stenosis: Secondary | ICD-10-CM | POA: Insufficient documentation

## 2023-07-15 DIAGNOSIS — D500 Iron deficiency anemia secondary to blood loss (chronic): Secondary | ICD-10-CM | POA: Diagnosis not present

## 2023-07-15 DIAGNOSIS — J9601 Acute respiratory failure with hypoxia: Secondary | ICD-10-CM | POA: Diagnosis not present

## 2023-07-15 DIAGNOSIS — E66812 Obesity, class 2: Secondary | ICD-10-CM | POA: Insufficient documentation

## 2023-07-15 DIAGNOSIS — N186 End stage renal disease: Secondary | ICD-10-CM | POA: Diagnosis not present

## 2023-07-15 DIAGNOSIS — D696 Thrombocytopenia, unspecified: Secondary | ICD-10-CM | POA: Insufficient documentation

## 2023-07-15 DIAGNOSIS — I10 Essential (primary) hypertension: Secondary | ICD-10-CM | POA: Diagnosis present

## 2023-07-15 DIAGNOSIS — I132 Hypertensive heart and chronic kidney disease with heart failure and with stage 5 chronic kidney disease, or end stage renal disease: Secondary | ICD-10-CM | POA: Diagnosis not present

## 2023-07-15 DIAGNOSIS — R0902 Hypoxemia: Secondary | ICD-10-CM | POA: Diagnosis not present

## 2023-07-15 DIAGNOSIS — D638 Anemia in other chronic diseases classified elsewhere: Secondary | ICD-10-CM | POA: Insufficient documentation

## 2023-07-15 DIAGNOSIS — I213 ST elevation (STEMI) myocardial infarction of unspecified site: Secondary | ICD-10-CM | POA: Diagnosis not present

## 2023-07-15 HISTORY — DX: Unspecified asthma with (acute) exacerbation: J45.901

## 2023-07-15 LAB — BASIC METABOLIC PANEL
Anion gap: 15 (ref 5–15)
BUN: 39 mg/dL — ABNORMAL HIGH (ref 6–20)
CO2: 25 mmol/L (ref 22–32)
Calcium: 9.6 mg/dL (ref 8.9–10.3)
Chloride: 99 mmol/L (ref 98–111)
Creatinine, Ser: 6.08 mg/dL — ABNORMAL HIGH (ref 0.44–1.00)
GFR, Estimated: 9 mL/min — ABNORMAL LOW (ref >60)
Glucose, Bld: 127 mg/dL — ABNORMAL HIGH (ref 70–99)
Potassium: 5.6 mmol/L — ABNORMAL HIGH (ref 3.5–5.1)
Sodium: 139 mmol/L (ref 135–145)

## 2023-07-15 LAB — ALBUMIN: Albumin: 3.4 g/dL — ABNORMAL LOW (ref 3.5–5.0)

## 2023-07-15 LAB — PROCALCITONIN: Procalcitonin: 1.11 ng/mL

## 2023-07-15 LAB — CBC
HCT: 33.5 % — ABNORMAL LOW (ref 36.0–46.0)
Hemoglobin: 9.8 g/dL — ABNORMAL LOW (ref 12.0–15.0)
MCH: 31.1 pg (ref 26.0–34.0)
MCHC: 29.3 g/dL — ABNORMAL LOW (ref 30.0–36.0)
MCV: 106.3 fL — ABNORMAL HIGH (ref 80.0–100.0)
Platelets: 114 10*3/uL — ABNORMAL LOW (ref 150–400)
RBC: 3.15 MIL/uL — ABNORMAL LOW (ref 3.87–5.11)
RDW: 17.6 % — ABNORMAL HIGH (ref 11.5–15.5)
WBC: 8.7 10*3/uL (ref 4.0–10.5)
nRBC: 0.2 % (ref 0.0–0.2)

## 2023-07-15 LAB — RESP PANEL BY RT-PCR (RSV, FLU A&B, COVID)  RVPGX2
Influenza A by PCR: NEGATIVE
Influenza B by PCR: NEGATIVE
Resp Syncytial Virus by PCR: NEGATIVE
SARS Coronavirus 2 by RT PCR: NEGATIVE

## 2023-07-15 LAB — HEPATITIS B SURFACE ANTIGEN: Hepatitis B Surface Ag: NONREACTIVE

## 2023-07-15 LAB — BRAIN NATRIURETIC PEPTIDE: B Natriuretic Peptide: 1979.9 pg/mL — ABNORMAL HIGH (ref 0.0–100.0)

## 2023-07-15 LAB — GLUCOSE, CAPILLARY: Glucose-Capillary: 108 mg/dL — ABNORMAL HIGH (ref 70–99)

## 2023-07-15 LAB — HIV ANTIBODY (ROUTINE TESTING W REFLEX): HIV Screen 4th Generation wRfx: NONREACTIVE

## 2023-07-15 LAB — MRSA NEXT GEN BY PCR, NASAL: MRSA by PCR Next Gen: NOT DETECTED

## 2023-07-15 LAB — POTASSIUM: Potassium: 5.9 mmol/L — ABNORMAL HIGH (ref 3.5–5.1)

## 2023-07-15 LAB — PHOSPHORUS: Phosphorus: 4.6 mg/dL (ref 2.5–4.6)

## 2023-07-15 MED ORDER — RENA-VITE PO TABS
1.0000 | ORAL_TABLET | Freq: Every day | ORAL | Status: DC
  Administered 2023-07-16: 1 via ORAL
  Filled 2023-07-15 (×2): qty 1

## 2023-07-15 MED ORDER — VANCOMYCIN HCL 750 MG/150ML IV SOLN
750.0000 mg | Freq: Once | INTRAVENOUS | Status: AC
  Administered 2023-07-15: 750 mg via INTRAVENOUS
  Filled 2023-07-15: qty 150

## 2023-07-15 MED ORDER — HYDRALAZINE HCL 25 MG PO TABS
25.0000 mg | ORAL_TABLET | Freq: Every day | ORAL | Status: DC
Start: 2023-07-15 — End: 2023-07-16
  Administered 2023-07-16: 25 mg via ORAL
  Filled 2023-07-15 (×2): qty 1

## 2023-07-15 MED ORDER — BUDESONIDE 0.25 MG/2ML IN SUSP
0.2500 mg | Freq: Two times a day (BID) | RESPIRATORY_TRACT | Status: DC
  Administered 2023-07-15 – 2023-07-16 (×3): 0.25 mg via RESPIRATORY_TRACT
  Filled 2023-07-15 (×3): qty 2

## 2023-07-15 MED ORDER — HEPARIN SODIUM (PORCINE) 5000 UNIT/ML IJ SOLN
5000.0000 [IU] | Freq: Three times a day (TID) | INTRAMUSCULAR | Status: DC
  Filled 2023-07-15 (×3): qty 1

## 2023-07-15 MED ORDER — ACETAMINOPHEN 325 MG PO TABS: 650.0000 mg | ORAL_TABLET | Freq: Four times a day (QID) | ORAL | Status: DC | PRN

## 2023-07-15 MED ORDER — FERRIC CITRATE 1 GM 210 MG(FE) PO TABS
420.0000 mg | ORAL_TABLET | Freq: Two times a day (BID) | ORAL | Status: DC
Start: 2023-07-15 — End: 2023-07-16
  Administered 2023-07-15: 420 mg via ORAL
  Filled 2023-07-15 (×2): qty 2

## 2023-07-15 MED ORDER — METOPROLOL SUCCINATE ER 100 MG PO TB24
100.0000 mg | ORAL_TABLET | Freq: Every day | ORAL | Status: DC
Start: 2023-07-15 — End: 2023-07-16
  Administered 2023-07-16: 100 mg via ORAL
  Filled 2023-07-15: qty 4
  Filled 2023-07-15: qty 1

## 2023-07-15 MED ORDER — ACETAMINOPHEN 650 MG RE SUPP: 650.0000 mg | Freq: Four times a day (QID) | RECTAL | Status: DC | PRN

## 2023-07-15 MED ORDER — ALBUTEROL SULFATE (2.5 MG/3ML) 0.083% IN NEBU: 2.5000 mg | INHALATION_SOLUTION | Freq: Four times a day (QID) | RESPIRATORY_TRACT | Status: DC | PRN

## 2023-07-15 MED ORDER — CHLORHEXIDINE GLUCONATE CLOTH 2 % EX PADS
6.0000 | MEDICATED_PAD | Freq: Every day | CUTANEOUS | Status: DC
  Administered 2023-07-16: 6 via TOPICAL

## 2023-07-15 MED ORDER — SODIUM CHLORIDE 0.9 % IV SOLN
2.0000 g | Freq: Once | INTRAVENOUS | Status: AC
  Administered 2023-07-15: 2 g via INTRAVENOUS
  Filled 2023-07-15: qty 10

## 2023-07-15 MED ORDER — PROCHLORPERAZINE EDISYLATE 10 MG/2ML IJ SOLN
10.0000 mg | Freq: Once | INTRAMUSCULAR | Status: AC
  Administered 2023-07-15: 10 mg via INTRAVENOUS
  Filled 2023-07-15: qty 2

## 2023-07-15 MED ORDER — PANTOPRAZOLE SODIUM 40 MG PO TBEC
40.0000 mg | DELAYED_RELEASE_TABLET | Freq: Two times a day (BID) | ORAL | Status: DC
  Administered 2023-07-15 – 2023-07-16 (×3): 40 mg via ORAL
  Filled 2023-07-15 (×3): qty 1

## 2023-07-15 MED ORDER — ALBUTEROL SULFATE (2.5 MG/3ML) 0.083% IN NEBU: 2.5000 mg | INHALATION_SOLUTION | RESPIRATORY_TRACT | Status: DC | PRN

## 2023-07-15 MED ORDER — DIPHENHYDRAMINE HCL 50 MG/ML IJ SOLN
25.0000 mg | Freq: Once | INTRAMUSCULAR | Status: AC
  Administered 2023-07-15: 25 mg via INTRAVENOUS
  Filled 2023-07-15: qty 1

## 2023-07-15 MED ORDER — PREDNISONE 20 MG PO TABS
40.0000 mg | ORAL_TABLET | Freq: Every day | ORAL | Status: DC
  Administered 2023-07-15: 40 mg via ORAL
  Filled 2023-07-15: qty 2

## 2023-07-15 MED ORDER — IPRATROPIUM-ALBUTEROL 0.5-2.5 (3) MG/3ML IN SOLN
3.0000 mL | Freq: Three times a day (TID) | RESPIRATORY_TRACT | Status: DC
  Administered 2023-07-15: 3 mL via RESPIRATORY_TRACT
  Filled 2023-07-15: qty 3

## 2023-07-15 MED ORDER — SODIUM ZIRCONIUM CYCLOSILICATE 10 G PO PACK
10.0000 g | PACK | Freq: Once | ORAL | Status: AC
  Administered 2023-07-15: 10 g via ORAL
  Filled 2023-07-15: qty 1

## 2023-07-15 NOTE — ED Notes (Incomplete)
Patient verbalizes understanding of discharge instructions. Opportunity for questioning and answers were provided. Pt discharged from ED.Pt resting with eyes closed; respirations spontaneous, even, unlabored

## 2023-07-15 NOTE — ED Provider Notes (Addendum)
[facility]-EMERGENCY DEPT [facility] Emergency Department Provider Note MRN:  [id number]  Arrival date & time: 07/15/23     Chief Complaint   SOB  History of Present Illness   [name] is a 32 y.o. year-old female presents to the ED with chief complaint of SOB since yesterday.  Has been progressively worsening.  Patient states that she was awakened with SOB tonight that was significant enough to call for EMS.  She called EMS.  EMS reports wheezing and gave solumedrol, mag, and nebs with some improvement.  Patient was last dialyzed on Monday and is due for dialysis today.  She states that she wasn't able to finish dialysis on Monday.  She denies fever, chills, or productive cough.  .  History provided by patient.   Review of Systems  Pertinent positive and negative review of systems noted in HPI.    Physical Exam   Vitals:   07/15/23 0337  BP: 125/76  Pulse: 98  Resp: (!) 24  Temp: 98.2 F (36.8 C)  SpO2: 95%    CONSTITUTIONAL:  non toxic-appearing, NAD NEURO:  Alert and oriented x 3, CN 3-12 grossly intact EYES:  eyes equal and reactive ENT/NECK:  Supple, no stridor  CARDIO:  normal rate, regular rhythm, appears well-perfused  PULM:  No respiratory distress, crackles present GI/GU:  non-distended,  MSK/SPINE: abnormal LEs 2/2 spina bifida SKIN:  no rash, atraumatic   *Additional and/or pertinent findings included in MDM below  Diagnostic and Interventional Summary    EKG Interpretation Date/Time:  Wednesday July 15 2023 03:39:17 EST Ventricular Rate:  97 PR Interval:  124 QRS Duration:  74 QT Interval:  356 QTC Calculation: 452 R Axis:   122  Text Interpretation: Normal sinus rhythm Possible Right ventricular hypertrophy Abnormal ECG Interpretation limited secondary to artifact Confirmed by Zadie Rhine ([id number]) on 07/15/2023 3:45:10 AM       Labs Reviewed  BASIC METABOLIC PANEL - Abnormal; Notable for the following components:      Result  Value   Potassium 5.6 (*)    Glucose, Bld 127 (*)    BUN 39 (*)    Creatinine, Ser 6.08 (*)    GFR, Estimated 9 (*)    All other components within normal limits  CBC - Abnormal; Notable for the following components:   RBC 3.15 (*)    Hemoglobin 9.8 (*)    HCT 33.5 (*)    MCV 106.3 (*)    MCHC 29.3 (*)    RDW 17.6 (*)    Platelets 114 (*)    All other components within normal limits  RESP PANEL BY RT-PCR (RSV, FLU A&B, COVID)  RVPGX2  MRSA NEXT GEN BY PCR, NASAL  BRAIN NATRIURETIC PEPTIDE  POTASSIUM    DG Chest 2 View  Final Result      Medications  aztreonam (AZACTAM) 2 g in sodium chloride 0.9 % 100 mL IVPB (2 g Intravenous New Bag/Given 07/15/23 0550)  vancomycin (VANCOREADY) IVPB 750 mg/150 mL (has no administration in time range)     Procedures  /  Critical Care .Critical Care  Performed by: Roxy Horseman, PA-C Authorized by: Roxy Horseman, PA-C   Critical care provider statement:    Critical care time (minutes):  38   Critical care was necessary to treat or prevent imminent or life-threatening deterioration of the following conditions:  Respiratory failure   Critical care was time spent personally by me on the following activities:  Development of treatment plan with patient  or surrogate, discussions with consultants, evaluation of patient's response to treatment, examination of patient, ordering and review of laboratory studies, ordering and review of radiographic studies, ordering and performing treatments and interventions, pulse oximetry, re-evaluation of patient's condition and review of old charts   ED Course and Medical Decision Making  I have reviewed the triage vital signs, the nursing notes, and pertinent available records from the EMR.  Social Determinants Affecting Complexity of Care: Patient has no clinically significant social determinants affecting this chief complaint..   ED Course:    Medical Decision Making Patient here with worsening  SOB.  ESRD.  Last dialysis was on Monday.  Likely needs admission and dialysis today.  She has some crackles in the right lung.  Will check CXR and labs.    Amount and/or Complexity of Data Reviewed Labs: ordered.    Details: No leukocytosis Radiology: ordered and independent interpretation performed.    Details: Questionable infiltrate in RLL  Risk Prescription drug management. Decision regarding hospitalization.         Consultants: I consulted with Hospitalist, Dr. Loney Loh, who is appreciated for admitting.   Treatment and Plan: Patient's exam and diagnostic results are concerning for SOB.  Feel that patient will need admission to the hospital for further treatment and evaluation.  Patient discussed with attending physician, Dr. Bebe Shaggy, who agrees with plan.  Final Clinical Impressions(s) / ED Diagnoses     ICD-10-CM   1. SOB (shortness of breath)  R06.02       ED Discharge Orders     None         Discharge Instructions Discussed with and Provided to Patient:   Discharge Instructions   None      Roxy Horseman, PA-C 07/15/23 0546    Roxy Horseman, PA-C 07/15/23 1610    Zadie Rhine, MD 07/15/23 [phone]

## 2023-07-15 NOTE — Progress Notes (Signed)
 Pt receives out-pt HD at [facility] on MWF. Will assist as needed.   Olivia Canter Renal Navigator [phone]

## 2023-07-15 NOTE — Procedures (Signed)
I have reviewed the HD regimen and made appropriate changes.  Vinson Moselle MD  [facility] 07/15/2023, 5:25 PM

## 2023-07-15 NOTE — ED Notes (Signed)
Pt transported to dialysis

## 2023-07-15 NOTE — H&P (Signed)
History and Physical    [name] ZOX:[id number] DOB: 1991-07-07 DOA: 07/15/2023  PCP: Park Meo, FNP  Patient coming from: Home  Chief Complaint: Shortness of breath  HPI: [name] is a 32 y.o. female with medical history significant of ESRD on HD MWF, hypertension, GERD, spina bifida with caudal regression syndrome, depression, marijuana and tobacco abuse, asthma, eczema, multiple food/environmental/medication allergies seen by an allergist, chronic diastolic CHF/RV failure, severe pulmonary hypertension, mitral stenosis, suspected OSA presented to the ED via EMS for evaluation of shortness of breath, cough, and wheezing.  Oxygen saturation 92% on room air and was placed on 2 L [place].  She was given Solu-Medrol 125 mg, IV mag 2 g, and 2 DuoNeb treatments by EMS.  Patient reports 24-hour history of shortness of breath, dry cough, and wheezing.  She was using her home albuterol inhaler but it was not helping.  She reports improvement of her symptoms after receiving treatments by EMS.  Denies fevers or recent sick contacts.  Patient states she has not missed any dialysis treatments but her last treatment on Monday was shorter than usual as she was having muscle cramps.  No other complaints.  ED Course: Satting 95% on 2 L [place] on arrival to the ED.  Afebrile.  Labs showing no leukocytosis, hemoglobin 9.8 (was in the 11-12 range 2 weeks ago but previously in the 9-10 range), platelet count 114k (low on previous labs as well), potassium 5.6 but sample reported to be possibly hemolyzed, bicarb 25, glucose 127, BNP pending, COVID/influenza/RSV PCR pending, MRSA PCR screen pending.  Repeat potassium level pending.  Chest x-ray showing right basilar infiltrate with overlying pleural effusion. Patient was given vancomycin and aztreonam.  TRH called to admit.  Review of Systems:  Review of Systems  All other systems reviewed and are negative.   Past Medical History:  Diagnosis Date    Anemia associated with chronic renal failure    Blood transfusion    Caudal regression syndrome    Assoc with spina bifida.   Depression 05/14/2015   Dialysis care    ESRD (end stage renal disease) on dialysis [facility])    GERD (gastroesophageal reflux disease) 01/07/2017   HTN (hypertension) 05/02/2011   Murmur, cardiac 07/17/2022   Spina bifida    UTI (lower urinary tract infection)    Viral URI with cough 09/15/2022    Past Surgical History:  Procedure Laterality Date   A/V FISTULAGRAM Left 10/14/2021   Procedure: A/V Fistulagram;  Surgeon: Maeola Harman, MD;  Location: [facility] INVASIVE CV LAB;  Service: Cardiovascular;  Laterality: Left;   AV FISTULA PLACEMENT     left arm   INSERTION OF DIALYSIS CATHETER N/A 05/08/2020   Procedure: ATTEMPTED, UNSUCCESSFUL INSERTION OF DIALYSIS CATHETER TUNNELED RIGHT INTERNAL JUGULAR;  Surgeon: Lucretia Roers, MD;  Location: [facility] ORS;  Service: General;  Laterality: N/A;   IR FLUORO GUIDE CV LINE RIGHT  05/09/2020   IR US GUIDE VASC ACCESS RIGHT  05/09/2020   RIGHT HEART CATH N/A 06/30/2023   Procedure: RIGHT HEART CATH;  Surgeon: Laurey Morale, MD;  Location: [facility] INVASIVE CV LAB;  Service: Cardiovascular;  Laterality: N/A;     reports that she has been smoking cigarettes. She has a 2.8 pack-year smoking history. She has been exposed to tobacco smoke. She has never used smokeless tobacco. She reports current drug use. Drug: Marijuana. She reports that she does not drink alcohol.  Allergies  Allergen Reactions   Ciprofloxacin Shortness  Of Breath, Nausea And Vomiting and Other (See Comments)    HIGH FEVER and oral blisters    Other Anaphylaxis    Revaclear dialzer   Peanut-Containing Drug Products Anaphylaxis   Aleve [Naproxen Sodium] Other (See Comments)    G.I.Bleed   Ceftriaxone Other (See Comments)    Blisters in mouth    Coconut (Cocos Nucifera) Hives   Influenza Vaccines Nausea And Vomiting and Other (See Comments)    High  fever   Tetanus Toxoid, Adsorbed Nausea And Vomiting and Other (See Comments)    HIGH FEVER, also   Tetanus Toxoids Nausea And Vomiting and Other (See Comments)    HIGH FEVER   Latex Itching and Rash    Family History  Problem Relation Age of Onset   Kidney cancer Other    Hypertension Maternal Grandmother    Arthritis Maternal Grandmother    Breast cancer Maternal Aunt    Colon cancer Neg Hx     Prior to Admission medications   Medication Sig Start Date End Date Taking? Authorizing Provider  acetaminophen (TYLENOL) 500 MG tablet Take 1,000 mg by mouth every 6 (six) hours as needed for mild pain or headache.    [provider]  albuterol (VENTOLIN HFA) 108 (90 Base) MCG/ACT inhaler Inhale 1-2 puffs into the lungs every 6 (six) hours as needed for wheezing or shortness of breath. 09/15/22   Park Meo, FNP  AURYXIA 1 GM 210 MG(Fe) tablet Take 420 mg by mouth 2 (two) times daily with a meal. 04/15/18   [provider]  B Complex-C-Folic Acid (DIALYVITE TABLET) TABS Take 1 tablet by mouth daily.    [provider]  diphenhydrAMINE (BENADRYL) 25 mg capsule Take 25 mg by mouth daily as needed for allergies.    [provider]  EPINEPHrine 0.3 mg/0.3 mL IJ SOAJ injection Inject 0.3 mg into the muscle as needed for anaphylaxis. 04/23/23   Park Meo, FNP  hydrALAZINE (APRESOLINE) 25 MG tablet Take 25 mg by mouth 3 (three) times daily. 04/28/20   [provider]  metoprolol succinate (TOPROL-XL) 100 MG 24 hr tablet Take 100 mg by mouth daily. 12/29/19   [provider]  omeprazole (PRILOSEC) 40 MG capsule Take 40 mg by mouth every morning. 01/08/22   [provider]  ondansetron (ZOFRAN-ODT) 4 MG disintegrating tablet Take 1 tablet (4 mg total) by mouth every 8 (eight) hours as needed. 06/13/22   Long, Arlyss Repress, MD  sodium zirconium cyclosilicate (LOKELMA) 5 g packet Take 10 g by mouth every Monday, Wednesday, and Friday. 07/20/21    [provider]    Physical Exam: Vitals:   07/15/23 0337  BP: 125/76  Pulse: 98  Resp: (!) 24  Temp: 98.2 F (36.8 C)  TempSrc: Oral  SpO2: 95%    Physical Exam Vitals reviewed.  Constitutional:      General: She is not in acute distress. HENT:     Head: Normocephalic and atraumatic.  Eyes:     Extraocular Movements: Extraocular movements intact.  Cardiovascular:     Rate and Rhythm: Normal rate and regular rhythm.     Pulses: Normal pulses.  Pulmonary:     Effort: No respiratory distress.     Breath sounds: No wheezing, rhonchi or rales.     Comments: Tachypneic to the high 20s Abdominal:     General: Bowel sounds are normal.     Palpations: Abdomen is soft.     Tenderness: There is no  abdominal tenderness. There is no guarding.  Musculoskeletal:     Cervical back: Normal range of motion.     Right lower leg: No edema.     Left lower leg: No edema.  Skin:    General: Skin is warm and dry.  Neurological:     Mental Status: She is alert and oriented to person, place, and time.     Labs on Admission: I have personally reviewed following labs and imaging studies  CBC: Recent Labs  Lab 07/15/23 0405  WBC 8.7  HGB 9.8*  HCT 33.5*  MCV 106.3*  PLT 114*   Basic Metabolic Panel: Recent Labs  Lab 07/15/23 0405  NA 139  K 5.6*  CL 99  CO2 25  GLUCOSE 127*  BUN 39*  CREATININE 6.08*  CALCIUM 9.6   GFR: Estimated Creatinine Clearance: 1.5 mL/min (A) (by C-G formula based on SCr of 6.08 mg/dL (H)). Liver Function Tests: No results for input(s): "AST", "ALT", "ALKPHOS", "BILITOT", "PROT", "ALBUMIN" in the last 168 hours. No results for input(s): "LIPASE", "AMYLASE" in the last 168 hours. No results for input(s): "AMMONIA" in the last 168 hours. Coagulation Profile: No results for input(s): "INR", "PROTIME" in the last 168 hours. Cardiac Enzymes: No results for input(s): "CKTOTAL", "CKMB", "CKMBINDEX", "TROPONINI" in the last 168 hours. BNP  (last 3 results) No results for input(s): "PROBNP" in the last 8760 hours. HbA1C: No results for input(s): "HGBA1C" in the last 72 hours. CBG: No results for input(s): "GLUCAP" in the last 168 hours. Lipid Profile: No results for input(s): "CHOL", "HDL", "LDLCALC", "TRIG", "CHOLHDL", "LDLDIRECT" in the last 72 hours. Thyroid Function Tests: No results for input(s): "TSH", "T4TOTAL", "FREET4", "T3FREE", "THYROIDAB" in the last 72 hours. Anemia Panel: No results for input(s): "VITAMINB12", "FOLATE", "FERRITIN", "TIBC", "IRON", "RETICCTPCT" in the last 72 hours. Urine analysis:    Component Value Date/Time   COLORURINE YELLOW 05/31/2023 0435   APPEARANCEUR HAZY (A) 05/31/2023 0435   LABSPEC 1.011 05/31/2023 0435   PHURINE 9.0 (H) 05/31/2023 0435   GLUCOSEU 50 (A) 05/31/2023 0435   HGBUR SMALL (A) 05/31/2023 0435   BILIRUBINUR NEGATIVE 05/31/2023 0435   KETONESUR NEGATIVE 05/31/2023 0435   PROTEINUR >=300 (A) 05/31/2023 0435   UROBILINOGEN 0.2 05/11/2015 0906   NITRITE NEGATIVE 05/31/2023 0435   LEUKOCYTESUR TRACE (A) 05/31/2023 0435    Radiological Exams on Admission: DG Chest 2 View Result Date: 07/15/2023 CLINICAL DATA:  Shortness of breath EXAM: CHEST - 2 VIEW COMPARISON:  05/30/2023 FINDINGS: Chronic cardiomegaly. Low lung volumes with pleural fluid on the right. Congested appearance of vessels and dense opacity at the right base attributed to a combination of pleural fluid and airspace disease, interval. No pneumothorax. Coarsened trabecula, there is end-stage renal disease. IMPRESSION: Low volume chest with infiltrate at the right base where there is also overlying pleural fluid. Electronically Signed   By: Tiburcio Pea M.D.   On: 07/15/2023 05:16    EKG: Independently reviewed.  Sinus rhythm, no significant change compared to previous tracing.  Assessment and Plan  Acute hypoxemic respiratory failure secondary to acute asthma exacerbation, pleural effusion, and possible  community-acquired pneumonia Patient presenting with complaints of shortness breath, dry cough, and wheezing.  Oxygen saturation initially 92% on room air on EMS arrival and was given Solu-Medrol 125 mg, IV mag 2 g, and 2 DuoNeb treatments by EMS.  In the ED, patient noted to be desatting to 89% on room air and currently requiring 2 L [place] to maintain sats in  the 90s.  No longer wheezing.  Chest x-ray showing right basilar infiltrate with overlying pleural effusion.  No fever or leukocytosis.  COVID/influenza/RSV PCR negative.  Continue antibiotics, prednisone 40 mg daily, Pulmicort neb twice daily, and albuterol neb every 6 hours as needed.  Check procalcitonin level.  MRSA PCR screen pending. Continue supplemental oxygen, wean as tolerated.  ESRD on HD MWF/volume overload Hyperkalemia Patient states her last dialysis session on Monday was shorter than usual due to her having muscle cramps.  Potassium 5.6> 5.9 on repeat labs. EKG without acute changes.  Lokelma ordered.  Consult nephrology in the morning for dialysis.  Anemia of chronic disease Hemoglobin currently 9.8 (was in the 11-12 range 2 weeks ago but previously in the 9-10 range).  No signs or symptoms of bleeding.  Continue to monitor labs.  Mild thrombocytopenia In the setting of ESRD.  Continue to monitor labs.  Chronic diastolic CHF/RV failure Severe pulmonary hypertension Suspected OSA Moderate mitral stenosis Moderate tricuspid regurgitation Last echo done in December 2023 showing EF 70 to 75%, RV systolic function low normal, severely elevated pulmonary artery systolic pressure, severe biatrial enlargement, mild mitral regurgitation, moderate mitral stenosis, moderate tricuspid regurgitation.  Patient is followed by cardiology and recent right heart cath done 06/30/2023 showing evidence of severe pulmonary arterial hypertension and RV failure.  Cardiology had ordered sleep study due to suspicion for OSA.  BNP  pending.  Hypertension Stable.  Continue hydralazine and metoprolol.  GERD Continue PPI.  DVT prophylaxis: SQ Heparin Code Status: Full Code (discussed with the patient) Family Communication: Husband at bedside. Level of care: Telemetry bed Admission status: It is my clinical opinion that referral for OBSERVATION is reasonable and necessary in this patient based on the above information provided. The aforementioned taken together are felt to place the patient at high risk for further clinical deterioration. However, it is anticipated that the patient may be medically stable for discharge from the hospital within 24 to 48 hours.  John Giovanni MD [facility]  If 7PM-7AM, please contact night-coverage [email or web address]  07/15/2023, 6:06 AM

## 2023-07-15 NOTE — ED Triage Notes (Signed)
Pt arrives via [facility] from home. Per medic report, the pt called out for SOB, Has been going on since yesterday, progressively worse. Off and on cough x 1 month, rec'd albuterol inhaler for the same. Hx of dialysis, MWF, unable to finish Mondays treatment, they did not take off  as much fluid as normal. 140/86, NSR 90's,  92% on RA, with wheezing in all fields, has had 2 duonebs, 125 solumedrol, 2gm Mag given. 99% on neb. IV established in the right forearm.

## 2023-07-15 NOTE — Progress Notes (Signed)
   07/15/23 1832  Vitals  Temp 98.4 F (36.9 C)  Pulse Rate 93  Resp 20  BP (!) 144/89  SpO2 100 %  O2 Device Nasal Cannula  Oxygen Therapy  O2 Flow Rate (L/min) 2 L/min  Post Treatment  Dialyzer Clearance Clear  Hemodialysis Intake (mL) 0 mL  Liters Processed 53.8  Fluid Removed (mL) 2500 mL  Tolerated HD Treatment Yes  Post-Hemodialysis Comments Tx. Completed without difficulties. Pt. Voice no Complaints. Report call to ED bedside RN  AVG/AVF Arterial Site Held (minutes) 5 minutes  AVG/AVF Venous Site Held (minutes) 5 minutes   Received patient in bed to unit.  Alert and oriented.  Informed consent signed and in chart.   TX duration:3 hours  Patient tolerated well.  Transported back to the room  Alert, without acute distress.  Hand-off given to patient's nurse.   Access used: Yes Access issues: No  Total UF removed: 2500 Medication(s) given: See MAR Post HD VS: See Above Grid Post HD weight: No Weight Bed   [facility]

## 2023-07-15 NOTE — Consult Note (Signed)
Renal Service Consult Note [facility]  [name] 07/15/2023 Jeanne Krabbe, MD Requesting Physician: Dr. Jomarie Haynes  Reason for Consult: ESRD pt admitted for SOB HPI: The patient is a 32 y.o. year-old w/ PMH as below who presented by EMS for SOB. Pt was given nebs, IV Mg, IV abx and IV steroids. CXR shows R effusion and early CHF. Pt admitted, we are asked to see for dialysis.   Pt seen in ED. Pt states she hasn't missed HD but her abdomen has been getting tighter and tighter, she thinks this is all fluid overload. Is only able to tolerate 2-2.5 L UF w/ HD due to cramping. Has been on HD about 10 yrs. Denies any prod cough or fevers.   ROS - denies CP, no joint pain, no HA, no blurry vision, no rash, no diarrhea, no nausea/ vomiting   Past Medical History  Past Medical History:  Diagnosis Date   Acute asthma exacerbation 07/15/2023   Anemia associated with chronic renal failure    Blood transfusion    Caudal regression syndrome    Assoc with spina bifida.   Depression 05/14/2015   Dialysis care    ESRD (end stage renal disease) on dialysis [facility])    GERD (gastroesophageal reflux disease) 01/07/2017   HTN (hypertension) 05/02/2011   Murmur, cardiac 07/17/2022   Spina bifida    UTI (lower urinary tract infection)    Viral URI with cough 09/15/2022   Past Surgical History  Past Surgical History:  Procedure Laterality Date   A/V FISTULAGRAM Left 10/14/2021   Procedure: A/V Fistulagram;  Surgeon: Jeanne Harman, MD;  Location: [facility] INVASIVE CV LAB;  Service: Cardiovascular;  Laterality: Left;   AV FISTULA PLACEMENT     left arm   INSERTION OF DIALYSIS CATHETER N/A 05/08/2020   Procedure: ATTEMPTED, UNSUCCESSFUL INSERTION OF DIALYSIS CATHETER TUNNELED RIGHT INTERNAL JUGULAR;  Surgeon: Jeanne Roers, MD;  Location: [facility];  Service: General;  Laterality: N/A;   IR FLUORO GUIDE CV LINE RIGHT  05/09/2020   IR US GUIDE VASC ACCESS RIGHT  05/09/2020    RIGHT HEART CATH N/A 06/30/2023   Procedure: RIGHT HEART CATH;  Surgeon: Jeanne Morale, MD;  Location: [facility] INVASIVE CV LAB;  Service: Cardiovascular;  Laterality: N/A;   Family History  Family History  Problem Relation Age of Onset   Kidney cancer Other    Hypertension Maternal Grandmother    Arthritis Maternal Grandmother    Breast cancer Maternal Aunt    Colon cancer Neg Hx    Social History  reports that she has been smoking cigarettes. She has a 2.8 pack-year smoking history. She has been exposed to tobacco smoke. She has never used smokeless tobacco. She reports current drug use. Drug: Marijuana. She reports that she does not drink alcohol. Allergies  Allergies  Allergen Reactions   Ciprofloxacin Shortness Of Breath, Nausea And Vomiting and Other (See Comments)    HIGH FEVER and oral blisters    Other Anaphylaxis    Revaclear dialzer   Peanut-Containing Drug Products Anaphylaxis   Aleve [Naproxen Sodium] Other (See Comments)    G.I.Bleed   Ceftriaxone Other (See Comments)    Blisters in mouth    Coconut (Cocos Nucifera) Hives   Influenza Vaccines Nausea And Vomiting and Other (See Comments)    High fever   Tetanus Toxoid, Adsorbed Nausea And Vomiting and Other (See Comments)    HIGH FEVER, also   Tetanus Toxoids Nausea And Vomiting and  Other (See Comments)    HIGH FEVER   Latex Itching and Rash   Home medications Prior to Admission medications   Medication Sig Start Date End Date Taking? Authorizing Provider  acetaminophen (TYLENOL) 500 MG tablet Take 1,000 mg by mouth every 6 (six) hours as needed for mild pain or headache.   Yes [provider]  albuterol (VENTOLIN HFA) 108 (90 Base) MCG/ACT inhaler Inhale 1-2 puffs into the lungs every 6 (six) hours as needed for wheezing or shortness of breath. 09/15/22  Yes Howard, Amber S, FNP  AURYXIA 1 GM 210 MG(Fe) tablet Take 420 mg by mouth 2 (two) times daily with a meal. 04/15/18  Yes [provider]  B  Complex-C-Folic Acid (DIALYVITE TABLET) TABS Take 1 tablet by mouth daily.   Yes [provider]  diphenhydrAMINE (BENADRYL) 25 mg capsule Take 25 mg by mouth daily as needed for allergies.   Yes [provider]  EPINEPHrine 0.3 mg/0.3 mL IJ SOAJ injection Inject 0.3 mg into the muscle as needed for anaphylaxis. 04/23/23  Yes Howard, Amber S, FNP  hydrALAZINE (APRESOLINE) 25 MG tablet Take 25 mg by mouth daily. 04/28/20  Yes [provider]  metoprolol succinate (TOPROL-XL) 100 MG 24 hr tablet Take 100 mg by mouth daily. 12/29/19  Yes [provider]  omeprazole (PRILOSEC) 40 MG capsule Take 40 mg by mouth in the morning and at bedtime. 01/08/22  Yes [provider]  sodium zirconium cyclosilicate (LOKELMA) 5 g packet Take 10 g by mouth every Monday, Wednesday, and Friday. 07/20/21  Yes [provider]  ondansetron (ZOFRAN-ODT) 4 MG disintegrating tablet Take 1 tablet (4 mg total) by mouth every 8 (eight) hours as needed. Patient not taking: Reported on 07/15/2023 06/13/22   Jeanne Plan, MD     Vitals:   07/15/23 0900 07/15/23 0930 07/15/23 1015 07/15/23 1026  BP: (!) 146/83 (!) 148/84 (!) 144/99   Pulse: 87 87 84   Resp: (!) 27 (!) 24 20   Temp:    97.7 F (36.5 C)  TempSrc:    Oral  SpO2: 100% 99% 91%   Weight:      Height:       Exam Gen alert, no distress, [place] O2 Facial edema 2-3+ Sclera anicteric, throat clear  No jvd or bruits Chest bilat crackles 1/3 up, no wheezing RRR no MRG Abd soft ntnd no mass, +ascites 2+ w/ protruding abdomen nontender GU defer MS bilat LE'Haynes malformed, mild edema Ext no UE edema Neuro is alert, Ox 3 , nf    LUA AVF+bruit     Renal-related home meds: - lokelma 10gm mwf - auryxia 2 ac tid - hydralazine 25 mg - toprol xl 100    OP HD: [facility] MWF  3.5h   300/500   31.5kg   2/2 bath  AVF   Heparin none - last HD 12/16, post wt 33.5, coming off 2-3kg over last 10 d - mircera 50 q 4, last 12/11,  due 08/05/23  - rocaltrol 12.5 mcg    CXR - R effusion, early CHF  Assessment/ Haynes: Acute hypoxic resp failure -  EMS gave IV steroids, IV Mg, nebs, 2L [place] O2. IV abx ordered. May be vol overload, pt looks overloaded.   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 Has not missed HD.  Hyperkalemia - K+ 5.8, treat w/ HD.  HTN - cont home meds, lower vol w/ HD Volume - has troubles at pulling more than 2.2 L, starts  to cramp. Haynes HD 2.5- 2.75 today.  Anemia of eskd - Hb 9.8, next esa due 1/08. Follow.  MBD ckd - Ca in range, check alb/ phos. Cont binders w/ meals.  Chronic diast HF/severe pHTN/ RV failure      Vinson Moselle  MD [facility] 07/15/2023, 10:30 AM  Recent Labs  Lab 07/15/23 0405 07/15/23 0558  HGB 9.8*  --   CALCIUM 9.6  --   CREATININE 6.08*  --   K 5.6* 5.9*   Inpatient medications:  budesonide (PULMICORT) nebulizer solution  0.25 mg Nebulization BID   ferric citrate  420 mg Oral BID WC   heparin  5,000 Units Subcutaneous Q8H   hydrALAZINE  25 mg Oral Daily   metoprolol succinate  100 mg Oral Daily   multivitamin  1 tablet Oral Daily   pantoprazole  40 mg Oral BID   predniSONE  40 mg Oral Q breakfast    acetaminophen **OR** acetaminophen, albuterol

## 2023-07-15 NOTE — ED Notes (Signed)
ED TO INPATIENT HANDOFF REPORT  ED Nurse Name and Phone #: Berna Spare [phone]  S Name/Age/Gender [name] 32 y.o. female Room/Bed: HD03C/HD03C  Code Status   Code Status: Full Code  Home/SNF/Other Home Patient oriented to: self, place, time, and situation Is this baseline? Yes   Triage Complete: Triage complete  Chief Complaint SOB (shortness of breath) [R06.02] Acute asthma exacerbation [J45.901]  Triage Note Pt arrives via [facility] from home. Per medic report, the pt called out for SOB, Has been going on since yesterday, progressively worse. Off and on cough x 1 month, rec'd albuterol inhaler for the same. Hx of dialysis, MWF, unable to finish Mondays treatment, they did not take off  as much fluid as normal. 140/86, NSR 90's,  92% on RA, with wheezing in all fields, has had 2 duonebs, 125 solumedrol, 2gm Mag given. 99% on neb. IV established in the right forearm.    Allergies Allergies  Allergen Reactions   Ciprofloxacin Shortness Of Breath, Nausea And Vomiting and Other (See Comments)    HIGH FEVER and oral blisters    Other Anaphylaxis    Revaclear dialzer   Peanut-Containing Drug Products Anaphylaxis   Aleve [Naproxen Sodium] Other (See Comments)    G.I.Bleed   Ceftriaxone Other (See Comments)    Blisters in mouth    Coconut (Cocos Nucifera) Hives   Influenza Vaccines Nausea And Vomiting and Other (See Comments)    High fever   Tetanus Toxoid, Adsorbed Nausea And Vomiting and Other (See Comments)    HIGH FEVER, also   Tetanus Toxoids Nausea And Vomiting and Other (See Comments)    HIGH FEVER   Latex Itching and Rash    Level of Care/Admitting Diagnosis ED Disposition     ED Disposition  Admit   Condition  --   Comment  Hospital Area: [facility] [[id number]]  Level of Care: Telemetry Medical [104]  May place patient in observation at [facility] or [facility] if equivalent level of care is available:: Yes  Covid Evaluation:  Asymptomatic - no recent exposure (last 10 days) testing not required  Diagnosis: Acute asthma exacerbation [308657]  Admitting Physician: John Giovanni [[id number]]  Attending Physician: John Giovanni [[id number]]          B Medical/Surgery History Past Medical History:  Diagnosis Date   Acute asthma exacerbation 07/15/2023   Anemia associated with chronic renal failure    Blood transfusion    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   Depression 05/14/2015   Dialysis care    ESRD (end stage renal disease) on dialysis [facility])    GERD (gastroesophageal reflux disease) 01/07/2017   HTN (hypertension) 05/02/2011   Murmur, cardiac 07/17/2022   Spina bifida    UTI (lower urinary tract infection)    Viral URI with cough 09/15/2022   Past Surgical History:  Procedure Laterality Date   A/V FISTULAGRAM Left 10/14/2021   Procedure: A/V Fistulagram;  Surgeon: Maeola Harman, MD;  Location: [facility] INVASIVE CV LAB;  Service: Cardiovascular;  Laterality: Left;   AV FISTULA PLACEMENT     left arm   INSERTION OF DIALYSIS CATHETER N/A 05/08/2020   Procedure: ATTEMPTED, UNSUCCESSFUL INSERTION OF DIALYSIS CATHETER TUNNELED RIGHT INTERNAL JUGULAR;  Surgeon: Lucretia Roers, MD;  Location: [facility];  Service: General;  Laterality: N/A;   IR FLUORO GUIDE CV LINE RIGHT  05/09/2020   IR US GUIDE VASC ACCESS RIGHT  05/09/2020   RIGHT HEART CATH N/A  06/30/2023   Procedure: RIGHT HEART CATH;  Surgeon: Laurey Morale, MD;  Location: [facility] INVASIVE CV LAB;  Service: Cardiovascular;  Laterality: N/A;     A IV Location/Drains/Wounds Patient Lines/Drains/Airways Status     Active Line/Drains/Airways     Name Placement date Placement time Site Days   Peripheral IV 07/15/23 20 G Anterior;Proximal;Right Antecubital 07/15/23  0341  Antecubital  less than 1   Vascular Access Arteriovenous fistula Upper arm Left --  --  Upper arm  --   Vascular Access Left Upper arm Arteriovenous  vein graft --  --  Upper arm  --   Hemodialysis Catheter Right Internal jugular Double lumen Permanent (Tunneled) 05/09/20  1055  Internal jugular  1162            Intake/Output Last 24 hours  Intake/Output Summary (Last 24 hours) at 07/15/2023 1920 Last data filed at 07/15/2023 1832 Gross per 24 hour  Intake --  Output 2500 ml  Net -2500 ml    Labs/Imaging Results for orders placed or performed during the hospital encounter of 07/15/23 (from the past 48 hours)  Basic metabolic panel     Status: Abnormal   Collection Time: 07/15/23  4:05 AM  Result Value Ref Range   Sodium 139 135 - 145 mmol/L   Potassium 5.6 (H) 3.5 - 5.1 mmol/L    Comment: HEMOLYSIS AT THIS LEVEL MAY AFFECT RESULT   Chloride 99 98 - 111 mmol/L   CO2 25 22 - 32 mmol/L   Glucose, Bld 127 (H) 70 - 99 mg/dL    Comment: Glucose reference range applies only to samples taken after fasting for at least 8 hours.   BUN 39 (H) 6 - 20 mg/dL   Creatinine, Ser 3.15 (H) 0.44 - 1.00 mg/dL   Calcium 9.6 8.9 - 17.6 mg/dL   GFR, Estimated 9 (L) >60 mL/min    Comment: (NOTE) Calculated using the CKD-EPI Creatinine Equation (2021)    Anion gap 15 5 - 15    Comment: Performed at [facility], [address] [place], [place] [address]  CBC     Status: Abnormal   Collection Time: 07/15/23  4:05 AM  Result Value Ref Range   WBC 8.7 4.0 - 10.5 K/uL   RBC 3.15 (L) 3.87 - 5.11 MIL/uL   Hemoglobin 9.8 (L) 12.0 - 15.0 g/dL   HCT 71.0 (L) 62.6 - 94.8 %   MCV 106.3 (H) 80.0 - 100.0 fL   MCH 31.1 26.0 - 34.0 pg   MCHC 29.3 (L) 30.0 - 36.0 g/dL   RDW 54.6 (H) 27.0 - 35.0 %   Platelets 114 (L) 150 - 400 K/uL    Comment: REPEATED TO VERIFY   nRBC 0.2 0.0 - 0.2 %    Comment: Performed at [facility] Lab, [address], [place], [place] [address]  Brain natriuretic peptide     Status: Abnormal   Collection Time: 07/15/23  4:05 AM  Result Value Ref Range   B Natriuretic Peptide 1,979.9 (H) 0.0 - 100.0 pg/mL     Comment: Performed at [facility], [address], [place], [place] [address]  Resp panel by RT-PCR (RSV, Flu A&B, Covid) Anterior Nasal Swab     Status: None   Collection Time: 07/15/23  4:14 AM   Specimen: Anterior Nasal Swab  Result Value Ref Range   SARS Coronavirus 2 by RT PCR NEGATIVE NEGATIVE   Influenza A by PCR NEGATIVE NEGATIVE  Influenza B by PCR NEGATIVE NEGATIVE    Comment: (NOTE) The Xpert Xpress SARS-CoV-2/FLU/RSV plus assay is intended as an aid in the diagnosis of influenza from Nasopharyngeal swab specimens and should not be used as a sole basis for treatment. Nasal washings and aspirates are unacceptable for Xpert Xpress SARS-CoV-2/FLU/RSV testing.  Fact Sheet for Patients: BloggerCourse.com  Fact Sheet for Healthcare Providers: SeriousBroker.it  This test is not yet approved or cleared by the [place] FDA and has been authorized for detection and/or diagnosis of SARS-CoV-2 by FDA under an Emergency Use Authorization (EUA). This EUA will remain in effect (meaning this test can be used) for the duration of the COVID-19 declaration under Section 564(b)(1) of the Act, 21 U.S.C. section 360bbb-3(b)(1), unless the authorization is terminated or revoked.     Resp Syncytial Virus by PCR NEGATIVE NEGATIVE    Comment: (NOTE) Fact Sheet for Patients: BloggerCourse.com  Fact Sheet for Healthcare Providers: SeriousBroker.it  This test is not yet approved or cleared by the [place] FDA and has been authorized for detection and/or diagnosis of SARS-CoV-2 by FDA under an Emergency Use Authorization (EUA). This EUA will remain in effect (meaning this test can be used) for the duration of the COVID-19 declaration under Section 564(b)(1) of the Act, 21 U.S.C. section 360bbb-3(b)(1), unless the authorization is terminated or revoked.  Performed at [facility], [address], [place], [place] [address]   Potassium     Status: Abnormal   Collection Time: 07/15/23  5:58 AM  Result Value Ref Range   Potassium 5.9 (H) 3.5 - 5.1 mmol/L    Comment: HEMOLYSIS AT THIS LEVEL MAY AFFECT RESULT Performed at [facility], [address] [place], [place] [address]   Procalcitonin     Status: None   Collection Time: 07/15/23  8:00 AM  Result Value Ref Range   Procalcitonin 1.11 ng/mL    Comment:        Interpretation: PCT > 0.5 ng/mL and <= 2 ng/mL: Systemic infection (sepsis) is possible, but other conditions are known to elevate PCT as well. (NOTE)       Sepsis PCT Algorithm           Lower Respiratory Tract                                      Infection PCT Algorithm    ----------------------------     ----------------------------         PCT < 0.25 ng/mL                PCT < 0.10 ng/mL          Strongly encourage             Strongly discourage   discontinuation of antibiotics    initiation of antibiotics    ----------------------------     -----------------------------       PCT 0.25 - 0.50 ng/mL            PCT 0.10 - 0.25 ng/mL               OR       >80% decrease in PCT            Discourage initiation of  antibiotics      Encourage discontinuation           of antibiotics    ----------------------------     -----------------------------         PCT >= 0.50 ng/mL              PCT 0.26 - 0.50 ng/mL                AND       <80% decrease in PCT             Encourage initiation of                                             antibiotics       Encourage continuation           of antibiotics    ----------------------------     -----------------------------        PCT >= 0.50 ng/mL                  PCT > 0.50 ng/mL               AND         increase in PCT                  Strongly encourage                                      initiation of antibiotics    Strongly encourage  escalation           of antibiotics                                     -----------------------------                                           PCT <= 0.25 ng/mL                                                 OR                                        > 80% decrease in PCT                                      Discontinue / Do not initiate                                             antibiotics  Performed at [facility], [address], [place], [place] [address]   HIV Antibody (routine testing w rflx)     Status:  None   Collection Time: 07/15/23  9:20 AM  Result Value Ref Range   HIV Screen 4th Generation wRfx Non Reactive Non Reactive    Comment: Performed at [facility], [address] [place], [place] [address]  Albumin     Status: Abnormal   Collection Time: 07/15/23  9:20 AM  Result Value Ref Range   Albumin 3.4 (L) 3.5 - 5.0 g/dL    Comment: Performed at [facility], [address] [place], [place] [address]  Phosphorus     Status: None   Collection Time: 07/15/23  9:20 AM  Result Value Ref Range   Phosphorus 4.6 2.5 - 4.6 mg/dL    Comment: Performed at [facility], [address], [place], [place] [address]  Hepatitis B surface antigen     Status: None   Collection Time: 07/15/23  1:31 PM  Result Value Ref Range   Hepatitis B Surface Ag NON REACTIVE NON REACTIVE    Comment: Performed at [facility], [address] [place], [place] [address]   DG Chest 2 View Result Date: 07/15/2023 CLINICAL DATA:  Shortness of breath EXAM: CHEST - 2 VIEW COMPARISON:  05/30/2023 FINDINGS: Chronic cardiomegaly. Low lung volumes with pleural fluid on the right. Congested appearance of vessels and dense opacity at the right base attributed to a combination of pleural fluid and airspace disease, interval. No pneumothorax. Coarsened trabecula, there is end-stage renal disease. IMPRESSION: Low volume chest with infiltrate at the right base where there is also  overlying pleural fluid. Electronically Signed   By: Tiburcio Pea M.D.   On: 07/15/2023 05:16    Pending Labs Unresulted Labs (From admission, onward)     Start     Ordered   07/16/23 0500  Comprehensive metabolic panel  Tomorrow morning,   R        07/15/23 0704   07/16/23 0500  CBC  Tomorrow morning,   R        07/15/23 0704   07/15/23 1130  Hepatitis B surface antibody,quantitative  Once,   R        07/15/23 1130   07/15/23 0552  MRSA Next Gen by PCR, Nasal  Once,   URGENT        07/15/23 0551            Vitals/Pain Today's Vitals   07/15/23 1701 07/15/23 1735 07/15/23 1829 07/15/23 1832  BP: (!) 148/104 (!) 155/98  (!) 144/89  Pulse: 98 93  93  Resp: (!) 22 (!) 22  20  Temp:    98.4 F (36.9 C)  TempSrc:      SpO2: 90% 96%  100%  Weight:      Height:      PainSc:   0-No pain     Isolation Precautions No active isolations  Medications Medications  hydrALAZINE (APRESOLINE) tablet 25 mg (0 mg Oral Hold 07/15/23 1029)  metoprolol succinate (TOPROL-XL) 24 hr tablet 100 mg (0 mg Oral Hold 07/15/23 1028)  ferric citrate (AURYXIA) tablet 420 mg (420 mg Oral Given 07/15/23 0734)  pantoprazole (PROTONIX) EC tablet 40 mg (40 mg Oral Given 07/15/23 1027)  multivitamin (RENA-VIT) tablet 1 tablet (0 tablets Oral Hold 07/15/23 1030)  heparin injection 5,000 Units (5,000 Units Subcutaneous Not Given 07/15/23 1333)  acetaminophen (TYLENOL) tablet 650 mg (has no administration in time range)    Or  acetaminophen (TYLENOL) suppository 650 mg (has no administration in time range)  budesonide (PULMICORT) nebulizer solution 0.25 mg (0.25 mg Nebulization Given 07/15/23 0733)  albuterol (PROVENTIL) (2.5 MG/3ML) 0.083% nebulizer solution 2.5 mg (has no administration in time range)  Chlorhexidine Gluconate Cloth 2 % PADS 6 each (has no administration in time range)  aztreonam (AZACTAM) 2 g in sodium chloride 0.9 % 100 mL IVPB (0 g Intravenous Stopped 07/15/23 0635)  vancomycin  (VANCOREADY) IVPB 750 mg/150 mL (0 mg Intravenous Stopped 07/15/23 0958)  sodium zirconium cyclosilicate (LOKELMA) packet 10 g (10 g Oral Given 07/15/23 9562)    Mobility non-ambulatory     Focused Assessments     R Recommendations: See Admitting Provider Note  Report given to:   Additional Notes: Completed dialysis

## 2023-07-15 NOTE — Progress Notes (Signed)
PROGRESS NOTE    [name]  ZOX:[id number] DOB: 03-17-1991 DOA: 07/15/2023 PCP: Park Meo, FNP  32/F with ESRD on HD MWF, spina bifid with caudal regression syndrome, depression, substance abuse, chronic diastolic CHF, RV failure, severe pulmonary hypertension, mitral stenosis, tobacco use, multiple food/environmental/medication allergies presented to the ED with shortness of breath.  In the ER she was given Solu-Medrol, nebs and magnesium.  In the ED mildly hypoxic, chronically anemic, K5.6, chest x-ray with question basilar infiltrate with overlying pleural effusion   Subjective: -Anxiously waiting for dialysis  Assessment and Plan:  Acute hypoxemic respiratory failure  -Clinically she is volume overloaded, has abdominal distention and edema -Clinically do not suspect pneumonia, no fevers or leukocytosis, no productive cough, discontinue antibiotics and monitor -Nephrology consulting, HD today   ESRD on HD MWF/volume overload Hyperkalemia -Likely needs dry weight lowered -Concern for ascites as well, check abdominal ultrasound  Acute on chronic diastolic CHF/RV failure Severe pulmonary hypertension Moderate mitral stenosis -Last echo 12/23 noted EF 70-75% with RV low normal, severely elevated PASP, moderate mitral stenosis -recent right heart cath done 06/30/2023 showing evidence of severe pulmonary arterial hypertension and RV failure.  - Cardiology ordered sleep study -Volume managed with HD, check abdominal ultrasound for ascites   Anemia of chronic disease -Stable   Mild thrombocytopenia -Mild, monitor   Hypertension -Continue hydralazine and metoprolol.   GERD Continue PPI.   DVT prophylaxis: SQ Heparin Code Status: Full Code (discussed with the patient) Family Communication: Significant other at bedside Disposition Plan:   Consultants:    Procedures:   Antimicrobials:    Objective: Vitals:   07/15/23 0930 07/15/23 1015 07/15/23 1026  07/15/23 1230  BP: (!) 148/84 (!) 144/99  (!) 149/92  Pulse: 87 84  84  Resp: (!) 24 20  (!) 25  Temp:   97.7 F (36.5 C)   TempSrc:   Oral   SpO2: 99% 91%  99%  Weight:      Height:       No intake or output data in the 24 hours ending 07/15/23 1405 Filed Weights   07/15/23 0634  Weight: 31.8 kg    Examination:  General exam: Chronically ill female, AAO x 3, facial edema HEENT: Positive JVD CVS: S1-S2, regular rhythm Lungs: Bilateral Rales Abdomen: Distended, fluid thrill noted Extremities: Malformed with cortical regression with trace edema     Data Reviewed:   CBC: Recent Labs  Lab 07/15/23 0405  WBC 8.7  HGB 9.8*  HCT 33.5*  MCV 106.3*  PLT 114*   Basic Metabolic Panel: Recent Labs  Lab 07/15/23 0405 07/15/23 0558 07/15/23 0920  NA 139  --   --   K 5.6* 5.9*  --   CL 99  --   --   CO2 25  --   --   GLUCOSE 127*  --   --   BUN 39*  --   --   CREATININE 6.08*  --   --   CALCIUM 9.6  --   --   PHOS  --   --  4.6   GFR: Estimated Creatinine Clearance: 1.5 mL/min (A) (by C-G formula based on SCr of 6.08 mg/dL (H)). Liver Function Tests: Recent Labs  Lab 07/15/23 0920  ALBUMIN 3.4*   No results for input(s): "LIPASE", "AMYLASE" in the last 168 hours. No results for input(s): "AMMONIA" in the last 168 hours. Coagulation Profile: No results for input(s): "INR", "PROTIME" in the last 168 hours. Cardiac Enzymes:  No results for input(s): "CKTOTAL", "CKMB", "CKMBINDEX", "TROPONINI" in the last 168 hours. BNP (last 3 results) No results for input(s): "PROBNP" in the last 8760 hours. HbA1C: No results for input(s): "HGBA1C" in the last 72 hours. CBG: No results for input(s): "GLUCAP" in the last 168 hours. Lipid Profile: No results for input(s): "CHOL", "HDL", "LDLCALC", "TRIG", "CHOLHDL", "LDLDIRECT" in the last 72 hours. Thyroid Function Tests: No results for input(s): "TSH", "T4TOTAL", "FREET4", "T3FREE", "THYROIDAB" in the last 72  hours. Anemia Panel: No results for input(s): "VITAMINB12", "FOLATE", "FERRITIN", "TIBC", "IRON", "RETICCTPCT" in the last 72 hours. Urine analysis:    Component Value Date/Time   COLORURINE YELLOW 05/31/2023 0435   APPEARANCEUR HAZY (A) 05/31/2023 0435   LABSPEC 1.011 05/31/2023 0435   PHURINE 9.0 (H) 05/31/2023 0435   GLUCOSEU 50 (A) 05/31/2023 0435   HGBUR SMALL (A) 05/31/2023 0435   BILIRUBINUR NEGATIVE 05/31/2023 0435   KETONESUR NEGATIVE 05/31/2023 0435   PROTEINUR >=300 (A) 05/31/2023 0435   UROBILINOGEN 0.2 05/11/2015 0906   NITRITE NEGATIVE 05/31/2023 0435   LEUKOCYTESUR TRACE (A) 05/31/2023 0435   Sepsis Labs: @LABRCNTIP (procalcitonin:4,lacticidven:4)  ) Recent Results (from the past 240 hours)  Resp panel by RT-PCR (RSV, Flu A&B, Covid) Anterior Nasal Swab     Status: None   Collection Time: 07/15/23  4:14 AM   Specimen: Anterior Nasal Swab  Result Value Ref Range Status   SARS Coronavirus 2 by RT PCR NEGATIVE NEGATIVE Final   Influenza A by PCR NEGATIVE NEGATIVE Final   Influenza B by PCR NEGATIVE NEGATIVE Final    Comment: (NOTE) The Xpert Xpress SARS-CoV-2/FLU/RSV plus assay is intended as an aid in the diagnosis of influenza from Nasopharyngeal swab specimens and should not be used as a sole basis for treatment. Nasal washings and aspirates are unacceptable for Xpert Xpress SARS-CoV-2/FLU/RSV testing.  Fact Sheet for Patients: BloggerCourse.com  Fact Sheet for Healthcare Providers: SeriousBroker.it  This test is not yet approved or cleared by the [place] FDA and has been authorized for detection and/or diagnosis of SARS-CoV-2 by FDA under an Emergency Use Authorization (EUA). This EUA will remain in effect (meaning this test can be used) for the duration of the COVID-19 declaration under Section 564(b)(1) of the Act, 21 U.S.C. section 360bbb-3(b)(1), unless the authorization is terminated  or revoked.     Resp Syncytial Virus by PCR NEGATIVE NEGATIVE Final    Comment: (NOTE) Fact Sheet for Patients: BloggerCourse.com  Fact Sheet for Healthcare Providers: SeriousBroker.it  This test is not yet approved or cleared by the [place] FDA and has been authorized for detection and/or diagnosis of SARS-CoV-2 by FDA under an Emergency Use Authorization (EUA). This EUA will remain in effect (meaning this test can be used) for the duration of the COVID-19 declaration under Section 564(b)(1) of the Act, 21 U.S.C. section 360bbb-3(b)(1), unless the authorization is terminated or revoked.  Performed at [facility], [address], [place], [place] [address]      Radiology Studies: DG Chest 2 View Result Date: 07/15/2023 CLINICAL DATA:  Shortness of breath EXAM: CHEST - 2 VIEW COMPARISON:  05/30/2023 FINDINGS: Chronic cardiomegaly. Low lung volumes with pleural fluid on the right. Congested appearance of vessels and dense opacity at the right base attributed to a combination of pleural fluid and airspace disease, interval. No pneumothorax. Coarsened trabecula, there is end-stage renal disease. IMPRESSION: Low volume chest with infiltrate at the right base where there is also overlying pleural fluid. Electronically Signed  By: Tiburcio Pea M.D.   On: 07/15/2023 05:16     Scheduled Meds:  budesonide (PULMICORT) nebulizer solution  0.25 mg Nebulization BID   Chlorhexidine Gluconate Cloth  6 each Topical Q0600   ferric citrate  420 mg Oral BID WC   heparin  5,000 Units Subcutaneous Q8H   hydrALAZINE  25 mg Oral Daily   metoprolol succinate  100 mg Oral Daily   multivitamin  1 tablet Oral Daily   pantoprazole  40 mg Oral BID   predniSONE  40 mg Oral Q breakfast   Continuous Infusions:   LOS: 0 days    Time spent:    Zannie Cove, MD [facility]   07/15/2023, 2:05 PM

## 2023-07-16 DIAGNOSIS — Z992 Dependence on renal dialysis: Secondary | ICD-10-CM

## 2023-07-16 DIAGNOSIS — R5383 Other fatigue: Secondary | ICD-10-CM | POA: Diagnosis not present

## 2023-07-16 DIAGNOSIS — J9601 Acute respiratory failure with hypoxia: Secondary | ICD-10-CM

## 2023-07-16 DIAGNOSIS — Z7401 Bed confinement status: Secondary | ICD-10-CM | POA: Diagnosis not present

## 2023-07-16 DIAGNOSIS — J45901 Unspecified asthma with (acute) exacerbation: Secondary | ICD-10-CM | POA: Diagnosis not present

## 2023-07-16 DIAGNOSIS — D638 Anemia in other chronic diseases classified elsewhere: Secondary | ICD-10-CM

## 2023-07-16 DIAGNOSIS — I509 Heart failure, unspecified: Secondary | ICD-10-CM | POA: Diagnosis not present

## 2023-07-16 DIAGNOSIS — N186 End stage renal disease: Secondary | ICD-10-CM

## 2023-07-16 LAB — COMPREHENSIVE METABOLIC PANEL
ALT: 17 U/L (ref 0–44)
AST: 28 U/L (ref 15–41)
Albumin: 3.5 g/dL (ref 3.5–5.0)
Alkaline Phosphatase: 139 U/L — ABNORMAL HIGH (ref 38–126)
Anion gap: 14 (ref 5–15)
BUN: 29 mg/dL — ABNORMAL HIGH (ref 6–20)
CO2: 27 mmol/L (ref 22–32)
Calcium: 9.8 mg/dL (ref 8.9–10.3)
Chloride: 97 mmol/L — ABNORMAL LOW (ref 98–111)
Creatinine, Ser: 4.46 mg/dL — ABNORMAL HIGH (ref 0.44–1.00)
GFR, Estimated: 13 mL/min — ABNORMAL LOW (ref >60)
Glucose, Bld: 115 mg/dL — ABNORMAL HIGH (ref 70–99)
Potassium: 5.1 mmol/L (ref 3.5–5.1)
Sodium: 138 mmol/L (ref 135–145)
Total Bilirubin: 1.2 mg/dL — ABNORMAL HIGH (ref <1.2)
Total Protein: 8.2 g/dL — ABNORMAL HIGH (ref 6.5–8.1)

## 2023-07-16 LAB — CBC
HCT: 36.5 % (ref 36.0–46.0)
Hemoglobin: 11 g/dL — ABNORMAL LOW (ref 12.0–15.0)
MCH: 31.7 pg (ref 26.0–34.0)
MCHC: 30.1 g/dL (ref 30.0–36.0)
MCV: 105.2 fL — ABNORMAL HIGH (ref 80.0–100.0)
Platelets: 136 10*3/uL — ABNORMAL LOW (ref 150–400)
RBC: 3.47 MIL/uL — ABNORMAL LOW (ref 3.87–5.11)
RDW: 18.1 % — ABNORMAL HIGH (ref 11.5–15.5)
WBC: 10.5 10*3/uL (ref 4.0–10.5)
nRBC: 0.3 % — ABNORMAL HIGH (ref 0.0–0.2)

## 2023-07-16 LAB — HEPATITIS B SURFACE ANTIBODY, QUANTITATIVE: Hep B S AB Quant (Post): 366 m[IU]/mL

## 2023-07-16 NOTE — Plan of Care (Signed)
  Problem: Education: Goal: Knowledge of General Education information will improve Description: Including pain rating scale, medication(s)/side effects and non-pharmacologic comfort measures Outcome: Progressing   Problem: Health Behavior/Discharge Planning: Goal: Ability to manage health-related needs will improve Outcome: Progressing   Problem: Clinical Measurements: Goal: Ability to maintain clinical measurements within normal limits will improve Outcome: Progressing Goal: Will remain free from infection Outcome: Progressing Goal: Diagnostic test results will improve Outcome: Progressing Goal: Respiratory complications will improve Outcome: Progressing   Problem: Activity: Goal: Risk for activity intolerance will decrease Outcome: Progressing   Problem: Coping: Goal: Level of anxiety will decrease Outcome: Progressing

## 2023-07-16 NOTE — Discharge Planning (Signed)
[facility] Patient Discharge Orders - [facility] CLINIC: [facility]   Patient's name: [name] Admit/DC Dates: 07/15/2023 - 07/16/23 OBV ONLY   DISCHARGE DIAGNOSES: AHRF   Hyperkalemia  HD ORDER CHANGES: Heparin change: no EDW Change: yes New EDW: 31kg *try this, unclear if can reach - got to 31.8 here but with eyelid edema* Bath Change: no  ANEMIA MANAGEMENT: Aranesp: Given: no    ESA dose for discharge: mircera 50 mcg IV q 4 weeks (same) IV Iron dose at discharge: per protocol Transfusion: Given: no  BONE/MINERAL MEDICATIONS: Hectorol/Calcitriol change: no Sensipar/Parsabiv change: no  ACCESS INTERVENTION/CHANGE: no Details:   RECENT LABS: Recent Labs  Lab 07/15/23 0920 07/16/23 0439  HGB  --  11.0*  NA  --  138  K  --  5.1  CALCIUM  --  9.8  PHOS 4.6  --   ALBUMIN 3.4* 3.5    IV ANTIBIOTICS: no Details:  OTHER ANTICOAGULATION: On Coumadin?: no   OTHER/APPTS/LAB ORDERS:   D/C Meds to be reconciled by nurse after every discharge.  Completed By: Ozzie Hoyle, PA-C [facility] Pager [phone]   Reviewed by: MD:______ RN_______

## 2023-07-16 NOTE — Progress Notes (Signed)
D/C order noted. Contacted [facility] to advise staff of pt's d/c today and that pt should resume care tomorrow.   Olivia Canter Renal Navigator [phone]

## 2023-07-16 NOTE — Progress Notes (Signed)
[facility] Progress Note   Subjective:   Seen in room - laying flat, room air. Breathing much improved s/p yesterday's HD with 2.5L off. We discussed lowering her outpatient EDW and she is agreeable. Ok for discharge today.  Objective Vitals:   07/15/23 2038 07/15/23 2200 07/15/23 2316 07/16/23 0739  BP:   (!) 131/96   Pulse: (!) 102 97 98   Resp: 20 20 (!) 21   Temp:   98.5 F (36.9 C)   TempSrc:   Oral   SpO2: 93% 98%  97%  Weight:      Height:       Physical Exam General: Frail woman, NAD. Room air, still with some supraorbital edema Heart: RRR; no murmur Lungs: CTAB; no wheezing or rales Abdomen: soft Extremities: No LE edema Dialysis Access: LUE AVF +t/b  Additional Objective Labs: Basic Metabolic Panel: Recent Labs  Lab 07/15/23 0405 07/15/23 0558 07/15/23 0920 07/16/23 0439  NA 139  --   --  138  K 5.6* 5.9*  --  5.1  CL 99  --   --  97*  CO2 25  --   --  27  GLUCOSE 127*  --   --  115*  BUN 39*  --   --  29*  CREATININE 6.08*  --   --  4.46*  CALCIUM 9.6  --   --  9.8  PHOS  --   --  4.6  --    Liver Function Tests: Recent Labs  Lab 07/15/23 0920 07/16/23 0439  AST  --  28  ALT  --  17  ALKPHOS  --  139*  BILITOT  --  1.2*  PROT  --  8.2*  ALBUMIN 3.4* 3.5   CBC: Recent Labs  Lab 07/15/23 0405 07/16/23 0439  WBC 8.7 10.5  HGB 9.8* 11.0*  HCT 33.5* 36.5  MCV 106.3* 105.2*  PLT 114* 136*   Studies/Results: DG Chest 2 View Result Date: 07/15/2023 CLINICAL DATA:  Shortness of breath EXAM: CHEST - 2 VIEW COMPARISON:  05/30/2023 FINDINGS: Chronic cardiomegaly. Low lung volumes with pleural fluid on the right. Congested appearance of vessels and dense opacity at the right base attributed to a combination of pleural fluid and airspace disease, interval. No pneumothorax. Coarsened trabecula, there is end-stage renal disease. IMPRESSION: Low volume chest with infiltrate at the right base where there is also overlying pleural  fluid. Electronically Signed   By: Tiburcio Pea M.D.   On: 07/15/2023 05:16   Medications:   budesonide (PULMICORT) nebulizer solution  0.25 mg Nebulization BID   Chlorhexidine Gluconate Cloth  6 each Topical Q0600   ferric citrate  420 mg Oral BID WC   heparin  5,000 Units Subcutaneous Q8H   hydrALAZINE  25 mg Oral Daily   metoprolol succinate  100 mg Oral Daily   multivitamin  1 tablet Oral Daily   pantoprazole  40 mg Oral BID    Dialysis Orders: 3.5h   300/500   31.5kg   2/2 bath  AVF   Heparin none - last HD 12/16, post wt 33.5, coming off 2-3kg over last 10 d - mircera 50 q 4, last 12/11, due 08/05/23  - rocaltrol 12.5 mcg     CXR - R effusion, early CHF   Assessment/ Plan: Acute hypoxic resp failure -  EMS gave IV steroids, IV Mg, nebs, 2L [place] O2. IV abx ordered. On exam, she appeared overloaded. Much better today s/p HD yesterday with  2.5L off. 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 Next HD tomorrow (as outpatient) Hyperkalemia - K+ 5.8, better with hD HTN - cont home meds, back at EDW s/p HD yesterday - agreeable to lower for outpatient. Volume - has troubles at pulling more than 2.2 L, starts to cramp.  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

## 2023-07-16 NOTE — Plan of Care (Signed)
Discharge to home.  Spouse at the bedside.

## 2023-07-16 NOTE — TOC Transition Note (Signed)
Transition of Care [facility]) - Discharge Note   Patient Details  Name: [name] MRN: [id number] Date of Birth: Dec 17, 1990  [facility] CM/SW Contact:  Gordy Clement, RN Phone Number: 07/16/2023, 9:37 AM   Clinical Narrative:     Patient will DC to home today.  [facility] will be transporting patient home . Patient states she can let them in to home. Husband is bedside and also wheelchair bound and will arrange transportation to home for himself.           Patient Goals and CMS Choice            Discharge Placement                       Discharge Plan and Services Additional resources added to the After Visit Summary for                                       Social Drivers of Health (SDOH) Interventions SDOH Screenings   Food Insecurity: No Food Insecurity (07/15/2023)  Housing: Unknown (07/15/2023)  Transportation Needs: No Transportation Needs (07/15/2023)  Utilities: Not At Risk (07/15/2023)  Depression (PHQ2-9): High Risk (09/15/2022)  Social Connections: Unknown (11/26/2021)   Received from [facility]  Tobacco Use: High Risk (07/15/2023)     Readmission Risk Interventions     No data to display

## 2023-07-16 NOTE — Plan of Care (Signed)

## 2023-07-16 NOTE — Discharge Summary (Signed)
PATIENT DETAILS Name: [name] Age: 32 y.o. Sex: female Date of Birth: 23-Jul-1991 MRN: [id number]. Admitting Physician: John Giovanni, MD WUJ:[id number], Binnie Rail, FNP  Admit Date: 07/15/2023 Discharge date: 07/16/2023  Recommendations for Outpatient Follow-up:  Follow up with PCP in 1-2 weeks Please obtain CMP/CBC in one week  Admitted From:  Home  Disposition: Home   Discharge Condition: good  CODE STATUS:   Code Status: Full Code   Diet recommendation:  Diet Order             Diet - low sodium heart healthy           Diet renal with fluid restriction Fluid restriction: 1200 mL Fluid; Room service appropriate? 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 Note-pneumonia ruled out.  Hyperkalemia Resolved with HD  ESRD on HD MWF Followed by nephrology closely Hemodialysis at previous schedule  Severe pulmonary hypertension/moderate mitral stenosis Outpatient follow-up with cardiology  Normocytic anemia Mild Stable for outpatient monitoring  HTN BP stable Hydralazine/metoprolol  GERD PPI  History of spina bifida with caudal regression Wheelchair-bound at baseline  Class 2 Obesity: Estimated body mass index is 38.03 kg/m as calculated from the following:   Height as of this encounter: 3' (0.914 m).   Weight as of this encounter: 31.8 kg.    Discharge  Diagnoses:  Principal Problem:   Acute asthma exacerbation Active Problems:   End stage renal disease on dialysis (HCC)   Anemia of chronic disease   HTN (hypertension)   Acute hypoxemic respiratory failure (HCC)   Pleural effusion   Hyperkalemia   CHF (congestive heart failure) (HCC)   Discharge Instructions:  Activity:  As tolerated with Full fall precautions use walker/cane & assistance as needed  Discharge Instructions     Call MD for:  difficulty breathing, headache or visual disturbances   Complete by: As directed    Call MD for:  persistant dizziness or light-headedness   Complete by: As directed    Diet - low sodium heart healthy   Complete by: As directed    Discharge instructions   Complete by: As directed    Follow with Primary MD  Park Meo, FNP in 1-2 weeks  Follow-up with your hemodialysis clinic at your usual schedule.  Please get a complete blood count and chemistry panel checked by your Primary MD at your next visit, and again as instructed by your Primary MD.  Get Medicines reviewed and adjusted: Please take all your medications with you for your next visit with your Primary MD  Laboratory/radiological data: Please request your Primary MD to go over all hospital tests and procedure/radiological results at the follow up, please ask your Primary MD to get all Hospital records sent to his/her office.  In some cases, they will be blood work, cultures and biopsy results pending at the time of your discharge. Please request that your primary care M.D. follows up on these results.  Also Note the following: If you experience worsening  of your admission symptoms, develop shortness of breath, life threatening emergency, suicidal or homicidal thoughts you must seek medical attention immediately by calling 911 or calling your MD immediately  if symptoms less severe.  You must read complete instructions/literature along with all the possible adverse  reactions/side effects for all the Medicines you take and that have been prescribed to you. Take any new Medicines after you have completely understood and accpet all the possible adverse reactions/side effects.   Do not drive when taking Pain medications or sleeping medications (Benzodaizepines)  Do not take more than prescribed Pain, Sleep and Anxiety Medications. It is not advisable to combine anxiety,sleep and pain medications without talking with your primary care practitioner  Special Instructions: If you have smoked or chewed Tobacco  in the last 2 yrs please stop smoking, stop any regular Alcohol  and or any Recreational drug use.  Wear Seat belts while driving.  Please note: You were cared for by a hospitalist during your hospital stay. Once you are discharged, your primary care physician will handle any further medical issues. Please note that NO REFILLS for any discharge medications will be authorized once you are discharged, as it is imperative that you return to your primary care physician (or establish a relationship with a primary care physician if you do not have one) for your post hospital discharge needs so that they can reassess your need for medications and monitor your lab values.   Increase activity slowly   Complete by: As directed       Allergies as of 07/16/2023       Reactions   Ciprofloxacin Shortness Of Breath, Nausea And Vomiting, Other (See Comments)   HIGH FEVER and oral blisters    Other Anaphylaxis   Revaclear dialzer   Peanut-containing Drug Products Anaphylaxis   Aleve [naproxen Sodium] Other (See Comments)   G.I.Bleed   Ceftriaxone Other (See Comments)   Blisters in mouth   Coconut (cocos Nucifera) Hives   Influenza Vaccines Nausea And Vomiting, Other (See Comments)   High fever   Tetanus Toxoid, Adsorbed Nausea And Vomiting, Other (See Comments)   HIGH FEVER, also   Tetanus Toxoids Nausea And Vomiting, Other (See Comments)   HIGH FEVER   Latex  Itching, Rash        Medication List     TAKE these medications    acetaminophen 500 MG tablet Commonly known as: TYLENOL Take 1,000 mg by mouth every 6 (six) hours as needed for mild pain or headache.   albuterol 108 (90 Base) MCG/ACT inhaler Commonly known as: VENTOLIN HFA Inhale 1-2 puffs into the lungs every 6 (six) hours as needed for wheezing or shortness of breath.   Auryxia 1 GM 210 MG(Fe) tablet Generic drug: ferric citrate Take 420 mg by mouth 2 (two) times daily with a meal.   DIALYVITE TABLET Tabs Take 1 tablet by mouth daily.   diphenhydrAMINE 25 mg capsule Commonly known as: BENADRYL Take 25 mg by mouth daily as needed for allergies.   EPINEPHrine 0.3 mg/0.3 mL Soaj injection Commonly known as: EPI-PEN Inject 0.3 mg into the muscle as needed for anaphylaxis.   hydrALAZINE 25 MG tablet Commonly known as: APRESOLINE Take 25 mg by mouth daily.   metoprolol succinate 100 MG 24 hr tablet Commonly known as: TOPROL-XL Take 100 mg by mouth daily.   omeprazole 40 MG capsule Commonly known as: PRILOSEC Take 40 mg by mouth in the morning and at bedtime.   ondansetron 4 MG  disintegrating tablet Commonly known as: ZOFRAN-ODT Take 1 tablet (4 mg total) by mouth every 8 (eight) hours as needed.   sodium zirconium cyclosilicate 5 g packet Commonly known as: LOKELMA Take 10 g by mouth every Monday, Wednesday, and Friday.        Follow-up Information     Park Meo, FNP. Schedule an appointment as soon as possible for a visit in 1 week(s).   Specialty: Family Medicine Contact information: [address] [place] [address] [phone]         Dialysis clinic- follow at your usual schedule. Follow up.                 Allergies  Allergen Reactions   Ciprofloxacin Shortness Of Breath, Nausea And Vomiting and Other (See Comments)    HIGH FEVER and oral blisters    Other Anaphylaxis    Revaclear dialzer   Peanut-Containing Drug  Products Anaphylaxis   Aleve [Naproxen Sodium] Other (See Comments)    G.I.Bleed   Ceftriaxone Other (See Comments)    Blisters in mouth    Coconut (Cocos Nucifera) Hives   Influenza Vaccines Nausea And Vomiting and Other (See Comments)    High fever   Tetanus Toxoid, Adsorbed Nausea And Vomiting and Other (See Comments)    HIGH FEVER, also   Tetanus Toxoids Nausea And Vomiting and Other (See Comments)    HIGH FEVER   Latex Itching and Rash     Other Procedures/Studies: DG Chest 2 View Result Date: 07/15/2023 CLINICAL DATA:  Shortness of breath EXAM: CHEST - 2 VIEW COMPARISON:  05/30/2023 FINDINGS: Chronic cardiomegaly. Low lung volumes with pleural fluid on the right. Congested appearance of vessels and dense opacity at the right base attributed to a combination of pleural fluid and airspace disease, interval. No pneumothorax. Coarsened trabecula, there is end-stage renal disease. IMPRESSION: Low volume chest with infiltrate at the right base where there is also overlying pleural fluid. Electronically Signed   By: Tiburcio Pea M.D.   On: 07/15/2023 05:16   CARDIAC CATHETERIZATION Result Date: 06/30/2023 1. Elevated R>>L heart filling pressures, predominantly RV failure. 2. Severe pulmonary arterial hypertension. 3. Preserved cardiac output. Needs more fluid off at HD.  Needs echo to assess mitral valve for mitral stenosis.  Needs further workup of pulmonary hypertension.     TODAY-DAY OF DISCHARGE:  Subjective:   [name] today has no headache,no chest abdominal pain,no new weakness tingling or numbness, feels much better wants to go home today.   Objective:   Blood pressure (!) 131/96, pulse 98, temperature 98.5 F (36.9 C), temperature source Oral, resp. rate (!) 21, height 3' (0.914 m), weight 31.8 kg, SpO2 97%.  Intake/Output Summary (Last 24 hours) at 07/16/2023 0843 Last data filed at 07/15/2023 1832 Gross per 24 hour  Intake --  Output 2500 ml  Net -2500 ml    Filed Weights   07/15/23 0634  Weight: 31.8 kg    Exam: Awake Alert, Oriented *3, No new F.N deficits, Normal affect Rough and Ready.AT,PERRAL Supple Neck,No JVD, No cervical lymphadenopathy appriciated.  Symmetrical Chest wall movement, Good air movement bilaterally, CTAB RRR,No Gallops,Rubs or new Murmurs, No Parasternal Heave +ve B.Sounds, Abd Soft, Non tender, No organomegaly appriciated, No rebound -guarding or rigidity. No Cyanosis, Clubbing or edema, No new Rash or bruise   PERTINENT RADIOLOGIC STUDIES: DG Chest 2 View Result Date: 07/15/2023 CLINICAL DATA:  Shortness of breath EXAM: CHEST - 2 VIEW COMPARISON:  05/30/2023 FINDINGS: Chronic  cardiomegaly. Low lung volumes with pleural fluid on the right. Congested appearance of vessels and dense opacity at the right base attributed to a combination of pleural fluid and airspace disease, interval. No pneumothorax. Coarsened trabecula, there is end-stage renal disease. IMPRESSION: Low volume chest with infiltrate at the right base where there is also overlying pleural fluid. Electronically Signed   By: Tiburcio Pea M.D.   On: 07/15/2023 05:16     PERTINENT LAB RESULTS: CBC: Recent Labs    07/15/23 0405 07/16/23 0439  WBC 8.7 10.5  HGB 9.8* 11.0*  HCT 33.5* 36.5  PLT 114* 136*   CMET CMP     Component Value Date/Time   NA 138 07/16/2023 0439   K 5.1 07/16/2023 0439   CL 97 (L) 07/16/2023 0439   CO2 27 07/16/2023 0439   GLUCOSE 115 (H) 07/16/2023 0439   BUN 29 (H) 07/16/2023 0439   CREATININE 4.46 (H) 07/16/2023 0439   CALCIUM 9.8 07/16/2023 0439   PROT 8.2 (H) 07/16/2023 0439   ALBUMIN 3.5 07/16/2023 0439   AST 28 07/16/2023 0439   ALT 17 07/16/2023 0439   ALKPHOS 139 (H) 07/16/2023 0439   BILITOT 1.2 (H) 07/16/2023 0439   GFRNONAA 13 (L) 07/16/2023 0439    GFR Estimated Creatinine Clearance: 2 mL/min (A) (by C-G formula based on SCr of 4.46 mg/dL (H)). No results for input(s): "LIPASE", "AMYLASE" in the last 72  hours. No results for input(s): "CKTOTAL", "CKMB", "CKMBINDEX", "TROPONINI" in the last 72 hours. Invalid input(s): "POCBNP" No results for input(s): "DDIMER" in the last 72 hours. No results for input(s): "HGBA1C" in the last 72 hours. No results for input(s): "CHOL", "HDL", "LDLCALC", "TRIG", "CHOLHDL", "LDLDIRECT" in the last 72 hours. No results for input(s): "TSH", "T4TOTAL", "T3FREE", "THYROIDAB" in the last 72 hours.  Invalid input(s): "FREET3" No results for input(s): "VITAMINB12", "FOLATE", "FERRITIN", "TIBC", "IRON", "RETICCTPCT" in the last 72 hours. Coags: No results for input(s): "INR" in the last 72 hours.  Invalid input(s): "PT" Microbiology: Recent Results (from the past 240 hours)  Resp panel by RT-PCR (RSV, Flu A&B, Covid) Anterior Nasal Swab     Status: None   Collection Time: 07/15/23  4:14 AM   Specimen: Anterior Nasal Swab  Result Value Ref Range Status   SARS Coronavirus 2 by RT PCR NEGATIVE NEGATIVE Final   Influenza A by PCR NEGATIVE NEGATIVE Final   Influenza B by PCR NEGATIVE NEGATIVE Final    Comment: (NOTE) The Xpert Xpress SARS-CoV-2/FLU/RSV plus assay is intended as an aid in the diagnosis of influenza from Nasopharyngeal swab specimens and should not be used as a sole basis for treatment. Nasal washings and aspirates are unacceptable for Xpert Xpress SARS-CoV-2/FLU/RSV testing.  Fact Sheet for Patients: BloggerCourse.com  Fact Sheet for Healthcare Providers: SeriousBroker.it  This test is not yet approved or cleared by the [place] FDA and has been authorized for detection and/or diagnosis of SARS-CoV-2 by FDA under an Emergency Use Authorization (EUA). This EUA will remain in effect (meaning this test can be used) for the duration of the COVID-19 declaration under Section 564(b)(1) of the Act, 21 U.S.C. section 360bbb-3(b)(1), unless the authorization is terminated or revoked.     Resp  Syncytial Virus by PCR NEGATIVE NEGATIVE Final    Comment: (NOTE) Fact Sheet for Patients: BloggerCourse.com  Fact Sheet for Healthcare Providers: SeriousBroker.it  This test is not yet approved or cleared by the [place] FDA and has been authorized for detection and/or diagnosis of  SARS-CoV-2 by FDA under an Emergency Use Authorization (EUA). This EUA will remain in effect (meaning this test can be used) for the duration of the COVID-19 declaration under Section 564(b)(1) of the Act, 21 U.S.C. section 360bbb-3(b)(1), unless the authorization is terminated or revoked.  Performed at [facility], [address] [place], [place] [address]   MRSA Next Gen by PCR, Nasal     Status: None   Collection Time: 07/15/23  5:52 AM   Specimen: Nasal Mucosa; Nasal Swab  Result Value Ref Range Status   MRSA by PCR Next Gen NOT DETECTED NOT DETECTED Final    Comment: (NOTE) The GeneXpert MRSA Assay (FDA approved for NASAL specimens only), is one component of a comprehensive MRSA colonization surveillance program. It is not intended to diagnose MRSA infection nor to guide or monitor treatment for MRSA infections. Test performance is not FDA approved in patients less than 22 years old. Performed at [facility], [address] [place], [place] [address]     FURTHER DISCHARGE INSTRUCTIONS:  Get Medicines reviewed and adjusted: Please take all your medications with you for your next visit with your Primary MD  Laboratory/radiological data: Please request your Primary MD to go over all hospital tests and procedure/radiological results at the follow up, please ask your Primary MD to get all Hospital records sent to his/her office.  In some cases, they will be blood work, cultures and biopsy results pending at the time of your discharge. Please request that your primary care M.D. goes through all the records of your hospital data  and follows up on these results.  Also Note the following: If you experience worsening of your admission symptoms, develop shortness of breath, life threatening emergency, suicidal or homicidal thoughts you must seek medical attention immediately by calling 911 or calling your MD immediately  if symptoms less severe.  You must read complete instructions/literature along with all the possible adverse reactions/side effects for all the Medicines you take and that have been prescribed to you. Take any new Medicines after you have completely understood and accpet all the possible adverse reactions/side effects.   Do not drive when taking Pain medications or sleeping medications (Benzodaizepines)  Do not take more than prescribed Pain, Sleep and Anxiety Medications. It is not advisable to combine anxiety,sleep and pain medications without talking with your primary care practitioner  Special Instructions: If you have smoked or chewed Tobacco  in the last 2 yrs please stop smoking, stop any regular Alcohol  and or any Recreational drug use.  Wear Seat belts while driving.  Please note: You were cared for by a hospitalist during your hospital stay. Once you are discharged, your primary care physician will handle any further medical issues. Please note that NO REFILLS for any discharge medications will be authorized once you are discharged, as it is imperative that you return to your primary care physician (or establish a relationship with a primary care physician if you do not have one) for your post hospital discharge needs so that they can reassess your need for medications and monitor your lab values.  Total Time spent coordinating discharge including counseling, education and face to face time equals greater than 30 minutes.  SignedJeoffrey Massed 07/16/2023 8:43 AM

## 2023-07-17 DIAGNOSIS — N186 End stage renal disease: Secondary | ICD-10-CM | POA: Diagnosis not present

## 2023-07-17 DIAGNOSIS — N2581 Secondary hyperparathyroidism of renal origin: Secondary | ICD-10-CM | POA: Diagnosis not present

## 2023-07-17 DIAGNOSIS — Z992 Dependence on renal dialysis: Secondary | ICD-10-CM | POA: Diagnosis not present

## 2023-07-17 DIAGNOSIS — E875 Hyperkalemia: Secondary | ICD-10-CM | POA: Diagnosis not present

## 2023-07-19 DIAGNOSIS — E875 Hyperkalemia: Secondary | ICD-10-CM | POA: Diagnosis not present

## 2023-07-19 DIAGNOSIS — Z992 Dependence on renal dialysis: Secondary | ICD-10-CM | POA: Diagnosis not present

## 2023-07-19 DIAGNOSIS — N2581 Secondary hyperparathyroidism of renal origin: Secondary | ICD-10-CM | POA: Diagnosis not present

## 2023-07-19 DIAGNOSIS — N186 End stage renal disease: Secondary | ICD-10-CM | POA: Diagnosis not present

## 2023-07-21 DIAGNOSIS — N2581 Secondary hyperparathyroidism of renal origin: Secondary | ICD-10-CM | POA: Diagnosis not present

## 2023-07-21 DIAGNOSIS — Z992 Dependence on renal dialysis: Secondary | ICD-10-CM | POA: Diagnosis not present

## 2023-07-21 DIAGNOSIS — E875 Hyperkalemia: Secondary | ICD-10-CM | POA: Diagnosis not present

## 2023-07-21 DIAGNOSIS — N186 End stage renal disease: Secondary | ICD-10-CM | POA: Diagnosis not present

## 2023-07-23 ENCOUNTER — Emergency Department (HOSPITAL_COMMUNITY)
Admission: EM | Admit: 2023-07-23 | Discharge: 2023-07-23 | Discharge status: Against Medical Advice | Payer: Medicare Other | Source: Home / Self Care

## 2023-07-23 ENCOUNTER — Other Ambulatory Visit: Payer: Self-pay

## 2023-07-23 ENCOUNTER — Encounter (HOSPITAL_COMMUNITY): Payer: Self-pay

## 2023-07-23 ENCOUNTER — Emergency Department (HOSPITAL_COMMUNITY): Payer: Medicare Other

## 2023-07-23 ENCOUNTER — Inpatient Hospital Stay (HOSPITAL_COMMUNITY)
Admission: EM | Admit: 2023-07-23 | Discharge: 2023-07-27 | DRG: 193 | Disposition: A | Discharge status: Home / Self Care | Payer: Medicare Other | Attending: Internal Medicine | Admitting: Internal Medicine

## 2023-07-23 ENCOUNTER — Ambulatory Visit (HOSPITAL_COMMUNITY)
Admission: RE | Admit: 2023-07-23 | Discharge: 2023-07-23 | Disposition: A | Discharge status: Home / Self Care | Payer: Medicare Other | Source: Ambulatory Visit | Attending: Cardiology | Admitting: Cardiology

## 2023-07-23 DIAGNOSIS — Z6841 Body Mass Index (BMI) 40.0 and over, adult: Secondary | ICD-10-CM | POA: Diagnosis not present

## 2023-07-23 DIAGNOSIS — I5033 Acute on chronic diastolic (congestive) heart failure: Secondary | ICD-10-CM | POA: Diagnosis not present

## 2023-07-23 DIAGNOSIS — I130 Hypertensive heart and chronic kidney disease with heart failure and stage 1 through stage 4 chronic kidney disease, or unspecified chronic kidney disease: Secondary | ICD-10-CM | POA: Insufficient documentation

## 2023-07-23 DIAGNOSIS — I509 Heart failure, unspecified: Secondary | ICD-10-CM | POA: Insufficient documentation

## 2023-07-23 DIAGNOSIS — Z887 Allergy status to serum and vaccine status: Secondary | ICD-10-CM

## 2023-07-23 DIAGNOSIS — Q059 Spina bifida, unspecified: Secondary | ICD-10-CM

## 2023-07-23 DIAGNOSIS — Z6838 Body mass index (BMI) 38.0-38.9, adult: Secondary | ICD-10-CM

## 2023-07-23 DIAGNOSIS — R0603 Acute respiratory distress: Secondary | ICD-10-CM | POA: Diagnosis not present

## 2023-07-23 DIAGNOSIS — E8770 Fluid overload, unspecified: Secondary | ICD-10-CM | POA: Diagnosis not present

## 2023-07-23 DIAGNOSIS — I5031 Acute diastolic (congestive) heart failure: Secondary | ICD-10-CM

## 2023-07-23 DIAGNOSIS — I132 Hypertensive heart and chronic kidney disease with heart failure and with stage 5 chronic kidney disease, or end stage renal disease: Secondary | ICD-10-CM | POA: Diagnosis present

## 2023-07-23 DIAGNOSIS — R0689 Other abnormalities of breathing: Secondary | ICD-10-CM | POA: Diagnosis not present

## 2023-07-23 DIAGNOSIS — Z7401 Bed confinement status: Secondary | ICD-10-CM | POA: Diagnosis not present

## 2023-07-23 DIAGNOSIS — I5082 Biventricular heart failure: Secondary | ICD-10-CM | POA: Diagnosis present

## 2023-07-23 DIAGNOSIS — F32A Depression, unspecified: Secondary | ICD-10-CM | POA: Diagnosis present

## 2023-07-23 DIAGNOSIS — Z993 Dependence on wheelchair: Secondary | ICD-10-CM | POA: Diagnosis not present

## 2023-07-23 DIAGNOSIS — Z992 Dependence on renal dialysis: Secondary | ICD-10-CM

## 2023-07-23 DIAGNOSIS — Z1152 Encounter for screening for COVID-19: Secondary | ICD-10-CM

## 2023-07-23 DIAGNOSIS — J189 Pneumonia, unspecified organism: Secondary | ICD-10-CM | POA: Diagnosis not present

## 2023-07-23 DIAGNOSIS — D696 Thrombocytopenia, unspecified: Secondary | ICD-10-CM | POA: Diagnosis present

## 2023-07-23 DIAGNOSIS — I120 Hypertensive chronic kidney disease with stage 5 chronic kidney disease or end stage renal disease: Secondary | ICD-10-CM | POA: Diagnosis not present

## 2023-07-23 DIAGNOSIS — Z8261 Family history of arthritis: Secondary | ICD-10-CM

## 2023-07-23 DIAGNOSIS — I050 Rheumatic mitral stenosis: Secondary | ICD-10-CM | POA: Diagnosis present

## 2023-07-23 DIAGNOSIS — Z79899 Other long term (current) drug therapy: Secondary | ICD-10-CM | POA: Diagnosis not present

## 2023-07-23 DIAGNOSIS — M7989 Other specified soft tissue disorders: Secondary | ICD-10-CM | POA: Diagnosis not present

## 2023-07-23 DIAGNOSIS — I2729 Other secondary pulmonary hypertension: Secondary | ICD-10-CM | POA: Diagnosis present

## 2023-07-23 DIAGNOSIS — R062 Wheezing: Secondary | ICD-10-CM | POA: Diagnosis not present

## 2023-07-23 DIAGNOSIS — J45909 Unspecified asthma, uncomplicated: Secondary | ICD-10-CM | POA: Diagnosis present

## 2023-07-23 DIAGNOSIS — Z91040 Latex allergy status: Secondary | ICD-10-CM

## 2023-07-23 DIAGNOSIS — Z882 Allergy status to sulfonamides status: Secondary | ICD-10-CM

## 2023-07-23 DIAGNOSIS — Z8051 Family history of malignant neoplasm of kidney: Secondary | ICD-10-CM

## 2023-07-23 DIAGNOSIS — R0989 Other specified symptoms and signs involving the circulatory and respiratory systems: Secondary | ICD-10-CM | POA: Diagnosis not present

## 2023-07-23 DIAGNOSIS — N2581 Secondary hyperparathyroidism of renal origin: Secondary | ICD-10-CM | POA: Diagnosis present

## 2023-07-23 DIAGNOSIS — N186 End stage renal disease: Secondary | ICD-10-CM | POA: Diagnosis not present

## 2023-07-23 DIAGNOSIS — I10 Essential (primary) hypertension: Secondary | ICD-10-CM | POA: Diagnosis not present

## 2023-07-23 DIAGNOSIS — N179 Acute kidney failure, unspecified: Secondary | ICD-10-CM | POA: Diagnosis present

## 2023-07-23 DIAGNOSIS — E875 Hyperkalemia: Secondary | ICD-10-CM | POA: Diagnosis not present

## 2023-07-23 DIAGNOSIS — F419 Anxiety disorder, unspecified: Secondary | ICD-10-CM | POA: Diagnosis present

## 2023-07-23 DIAGNOSIS — R0602 Shortness of breath: Secondary | ICD-10-CM | POA: Diagnosis not present

## 2023-07-23 DIAGNOSIS — D631 Anemia in chronic kidney disease: Secondary | ICD-10-CM | POA: Diagnosis present

## 2023-07-23 DIAGNOSIS — R918 Other nonspecific abnormal finding of lung field: Secondary | ICD-10-CM | POA: Diagnosis not present

## 2023-07-23 DIAGNOSIS — Z91010 Allergy to peanuts: Secondary | ICD-10-CM

## 2023-07-23 DIAGNOSIS — K219 Gastro-esophageal reflux disease without esophagitis: Secondary | ICD-10-CM | POA: Diagnosis present

## 2023-07-23 DIAGNOSIS — Z8249 Family history of ischemic heart disease and other diseases of the circulatory system: Secondary | ICD-10-CM

## 2023-07-23 DIAGNOSIS — I3139 Other pericardial effusion (noninflammatory): Secondary | ICD-10-CM | POA: Diagnosis present

## 2023-07-23 DIAGNOSIS — F17210 Nicotine dependence, cigarettes, uncomplicated: Secondary | ICD-10-CM | POA: Diagnosis present

## 2023-07-23 DIAGNOSIS — D539 Nutritional anemia, unspecified: Secondary | ICD-10-CM | POA: Diagnosis present

## 2023-07-23 DIAGNOSIS — J9811 Atelectasis: Secondary | ICD-10-CM | POA: Diagnosis not present

## 2023-07-23 DIAGNOSIS — I5030 Unspecified diastolic (congestive) heart failure: Secondary | ICD-10-CM | POA: Diagnosis not present

## 2023-07-23 DIAGNOSIS — Z9109 Other allergy status, other than to drugs and biological substances: Secondary | ICD-10-CM

## 2023-07-23 DIAGNOSIS — J90 Pleural effusion, not elsewhere classified: Secondary | ICD-10-CM | POA: Diagnosis not present

## 2023-07-23 DIAGNOSIS — E66812 Obesity, class 2: Secondary | ICD-10-CM | POA: Diagnosis present

## 2023-07-23 DIAGNOSIS — J9601 Acute respiratory failure with hypoxia: Secondary | ICD-10-CM | POA: Diagnosis not present

## 2023-07-23 DIAGNOSIS — Y95 Nosocomial condition: Secondary | ICD-10-CM | POA: Diagnosis present

## 2023-07-23 DIAGNOSIS — M2450 Contracture, unspecified joint: Secondary | ICD-10-CM | POA: Diagnosis present

## 2023-07-23 DIAGNOSIS — R0600 Dyspnea, unspecified: Secondary | ICD-10-CM | POA: Diagnosis not present

## 2023-07-23 DIAGNOSIS — Z803 Family history of malignant neoplasm of breast: Secondary | ICD-10-CM

## 2023-07-23 DIAGNOSIS — I088 Other rheumatic multiple valve diseases: Secondary | ICD-10-CM | POA: Insufficient documentation

## 2023-07-23 DIAGNOSIS — E669 Obesity, unspecified: Secondary | ICD-10-CM | POA: Diagnosis present

## 2023-07-23 DIAGNOSIS — I517 Cardiomegaly: Secondary | ICD-10-CM | POA: Diagnosis not present

## 2023-07-23 DIAGNOSIS — D649 Anemia, unspecified: Secondary | ICD-10-CM | POA: Diagnosis not present

## 2023-07-23 LAB — BRAIN NATRIURETIC PEPTIDE: B Natriuretic Peptide: 2478.9 pg/mL — ABNORMAL HIGH (ref 0.0–100.0)

## 2023-07-23 LAB — CBC WITH DIFFERENTIAL/PLATELET
Abs Immature Granulocytes: 0.04 10*3/uL (ref 0.00–0.07)
Basophils Absolute: 0 10*3/uL (ref 0.0–0.1)
Basophils Relative: 0 %
Eosinophils Absolute: 0.1 10*3/uL (ref 0.0–0.5)
Eosinophils Relative: 1 %
HCT: 35.4 % — ABNORMAL LOW (ref 36.0–46.0)
Hemoglobin: 10.7 g/dL — ABNORMAL LOW (ref 12.0–15.0)
Immature Granulocytes: 1 %
Lymphocytes Relative: 8 %
Lymphs Abs: 0.6 10*3/uL — ABNORMAL LOW (ref 0.7–4.0)
MCH: 31.8 pg (ref 26.0–34.0)
MCHC: 30.2 g/dL (ref 30.0–36.0)
MCV: 105 fL — ABNORMAL HIGH (ref 80.0–100.0)
Monocytes Absolute: 1.1 10*3/uL — ABNORMAL HIGH (ref 0.1–1.0)
Monocytes Relative: 14 %
Neutro Abs: 5.8 10*3/uL (ref 1.7–7.7)
Neutrophils Relative %: 76 %
Platelets: 128 10*3/uL — ABNORMAL LOW (ref 150–400)
RBC: 3.37 MIL/uL — ABNORMAL LOW (ref 3.87–5.11)
RDW: 17.7 % — ABNORMAL HIGH (ref 11.5–15.5)
WBC: 7.6 10*3/uL (ref 4.0–10.5)
nRBC: 0.4 % — ABNORMAL HIGH (ref 0.0–0.2)

## 2023-07-23 LAB — ECHOCARDIOGRAM COMPLETE
AR max vel: 0.97 cm2
AV Area VTI: 0.95 cm2
AV Area mean vel: 1.11 cm2
AV Mean grad: 9 mm[Hg]
AV Peak grad: 17.1 mm[Hg]
Ao pk vel: 2.07 m/s
Area-P 1/2: 4.6 cm2
Calc EF: 77.6 %
Est EF: >75
MV VTI: 0.53 cm2
S' Lateral: 1.4 cm
Single Plane A2C EF: 76.3 %
Single Plane A4C EF: 77.3 %

## 2023-07-23 LAB — COMPREHENSIVE METABOLIC PANEL
ALT: 18 U/L (ref 0–44)
AST: 28 U/L (ref 15–41)
Albumin: 3.7 g/dL (ref 3.5–5.0)
Alkaline Phosphatase: 135 U/L — ABNORMAL HIGH (ref 38–126)
Anion gap: 21 — ABNORMAL HIGH (ref 5–15)
BUN: 56 mg/dL — ABNORMAL HIGH (ref 6–20)
CO2: 21 mmol/L — ABNORMAL LOW (ref 22–32)
Calcium: 9.5 mg/dL (ref 8.9–10.3)
Chloride: 97 mmol/L — ABNORMAL LOW (ref 98–111)
Creatinine, Ser: 7.34 mg/dL — ABNORMAL HIGH (ref 0.44–1.00)
GFR, Estimated: 7 mL/min — ABNORMAL LOW (ref >60)
Glucose, Bld: 73 mg/dL (ref 70–99)
Potassium: 5.7 mmol/L — ABNORMAL HIGH (ref 3.5–5.1)
Sodium: 139 mmol/L (ref 135–145)
Total Bilirubin: 1.1 mg/dL (ref <1.2)
Total Protein: 7.9 g/dL (ref 6.5–8.1)

## 2023-07-23 LAB — RESP PANEL BY RT-PCR (RSV, FLU A&B, COVID)  RVPGX2
Influenza A by PCR: NEGATIVE
Influenza B by PCR: NEGATIVE
Resp Syncytial Virus by PCR: NEGATIVE
SARS Coronavirus 2 by RT PCR: NEGATIVE

## 2023-07-23 LAB — MAGNESIUM: Magnesium: 1.9 mg/dL (ref 1.7–2.4)

## 2023-07-23 LAB — CBG MONITORING, ED: Glucose-Capillary: 100 mg/dL — ABNORMAL HIGH (ref 70–99)

## 2023-07-23 LAB — MRSA NEXT GEN BY PCR, NASAL: MRSA by PCR Next Gen: NOT DETECTED

## 2023-07-23 MED ORDER — CHLORHEXIDINE GLUCONATE CLOTH 2 % EX PADS: 6.0000 | MEDICATED_PAD | Freq: Every day | CUTANEOUS | Status: DC

## 2023-07-23 MED ORDER — ALBUTEROL SULFATE (2.5 MG/3ML) 0.083% IN NEBU
5.0000 mg | INHALATION_SOLUTION | Freq: Once | RESPIRATORY_TRACT | Status: AC
  Administered 2023-07-23: 5 mg via RESPIRATORY_TRACT
  Filled 2023-07-23: qty 6

## 2023-07-23 MED ORDER — PIPERACILLIN-TAZOBACTAM IN DEX 2-0.25 GM/50ML IV SOLN
2.2500 g | Freq: Once | INTRAVENOUS | Status: AC
  Administered 2023-07-24: 2.25 g via INTRAVENOUS
  Filled 2023-07-23: qty 50

## 2023-07-23 MED ORDER — VANCOMYCIN HCL 750 MG/150ML IV SOLN
750.0000 mg | Freq: Once | INTRAVENOUS | Status: DC
  Filled 2023-07-23: qty 150

## 2023-07-23 MED ORDER — VANCOMYCIN HCL 1000 MG IV SOLR
750.0000 mg | Freq: Once | INTRAVENOUS | Status: DC
  Filled 2023-07-23: qty 15

## 2023-07-23 MED ORDER — DEXTROSE 50 % IV SOLN
1.0000 | Freq: Once | INTRAVENOUS | Status: AC
  Administered 2023-07-24: 50 mL via INTRAVENOUS
  Filled 2023-07-23: qty 50

## 2023-07-23 MED ORDER — INSULIN ASPART 100 UNIT/ML IV SOLN
10.0000 [IU] | Freq: Once | INTRAVENOUS | Status: AC
  Administered 2023-07-24: 10 [IU] via INTRAVENOUS

## 2023-07-23 MED ORDER — ACETAMINOPHEN 325 MG PO TABS
650.0000 mg | ORAL_TABLET | Freq: Once | ORAL | Status: AC
  Administered 2023-07-23: 650 mg via ORAL
  Filled 2023-07-23: qty 2

## 2023-07-23 NOTE — ED Notes (Signed)
IV team at bedside starting IV [place].

## 2023-07-23 NOTE — ED Triage Notes (Signed)
Patient BIB [facility] from home c/o shob, recently admitted for same. Patient had 10mg  albuterol, 1mg  atrovent, no IV access for other meds. Patient tachypneic with accessory muscle use.

## 2023-07-23 NOTE — ED Provider Notes (Signed)
EMERGENCY DEPARTMENT AT [facility] Provider Note  HPI   [name] is a 32 y.o. female patient with a PMHx of HD MWF, spina bifida with caudal regression syndrome-wheelchair-bound, depression, chronic HFpEF, RV failure, severe pulmonary hypertension, mitral stenosis who is here today with shortness of breath.  Patient states that she was discharged on 16 July 2023, about less than 10 days ago, she was here for heart failure exacerbation, she was concern for pneumonia.  Per chart review, there was no pneumonia, they gave dialysis, felt that her shortness of breath was presented due to cutting her dialysis short.  She states her last dialysis session was 07/21/2023, and received a full session.  She is due for tomorrow, she is normally Monday Wednesday Friday, but last received on Tuesday Christmas eve given the holiday.  She states that she has been having a fever.   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  Terms of workup, there is concern for infection pneumonia on the right  side and possible pleural effusion that could be infected as well I did antibiosis her with vancomycin and Zosyn.  Her BNP is elevated 2500, COVID flu is negative, her potassium is elevated 5.7, we are going to be getting the EKG to look for any EKG signs but we are going to be given insulin.  Hemoglobin is 10.7,.  This patient will be admitted to the hospital for significant work of breathing.  Her work of breathing significantly worsen, did have to put her on BiPAP, she did not desaturate during this episode.  Her tachypnea has significantly improved, she is saturating at 100%.  I have spoken to the medicine residents, they will be admitting this patient to their service, I am going to be following up on the EKG as well  EKG had already been done I just did not see a clip is not popping up in our computer system.  Heart rate was 106, sinus tachycardia, denies any peaked T waves   1. Respiratory distress     @DISPOSITION @  Rx / DC Orders ED Discharge Orders     None        Past Medical History:  Diagnosis Date   Acute asthma exacerbation 07/15/2023   Anemia associated with chronic renal failure    Blood transfusion    Caudal regression syndrome    Assoc with spina bifida.   Depression 05/14/2015   Dialysis care    ESRD (end stage renal disease) on dialysis [facility])    GERD (gastroesophageal reflux disease) 01/07/2017   HTN (  hypertension) 05/02/2011   Murmur, cardiac 07/17/2022   Spina bifida    UTI (lower urinary tract infection)    Viral URI with cough 09/15/2022   Past Surgical History:  Procedure Laterality Date   A/V FISTULAGRAM Left 10/14/2021   Procedure: A/V Fistulagram;  Surgeon: Maeola Harman, MD;  Location: [facility] INVASIVE CV LAB;  Service: Cardiovascular;  Laterality: Left;   AV FISTULA PLACEMENT     left arm   INSERTION OF DIALYSIS CATHETER N/A 05/08/2020   Procedure: ATTEMPTED, UNSUCCESSFUL INSERTION OF DIALYSIS CATHETER TUNNELED RIGHT INTERNAL JUGULAR;   Surgeon: Lucretia Roers, MD;  Location: [facility] ORS;  Service: General;  Laterality: N/A;   IR FLUORO GUIDE CV LINE RIGHT  05/09/2020   IR US GUIDE VASC ACCESS RIGHT  05/09/2020   RIGHT HEART CATH N/A 06/30/2023   Procedure: RIGHT HEART CATH;  Surgeon: Laurey Morale, MD;  Location: [facility] INVASIVE CV LAB;  Service: Cardiovascular;  Laterality: N/A;   Family History  Problem Relation Age of Onset   Kidney cancer Other    Hypertension Maternal Grandmother    Arthritis Maternal Grandmother    Breast cancer Maternal Aunt    Colon cancer Neg Hx    Social History   Socioeconomic History   Marital status: Married    Spouse name: Not on file   Number of children: Not on file   Years of education: Not on file   Highest education level: Not on file  Occupational History   Not on file  Tobacco Use   Smoking status: Every Day    Current packs/day: 0.20    Average packs/day: 0.2 packs/day for 14.0 years (2.8 ttl pk-yrs)    Types: Cigarettes    Passive exposure: Current   Smokeless tobacco: Never   Tobacco comments:    2 cigs a day  Substance and Sexual Activity   Alcohol use: No   Drug use: Yes    Types: Marijuana   Sexual activity: Never  Other Topics Concern   Not on file  Social History Narrative   Not on file   Social Drivers of Health   Financial Resource Strain: Not on file  Food Insecurity: No Food Insecurity (07/15/2023)   Hunger Vital Sign    Worried About Running Out of Food in the Last Year: Never true    Ran Out of Food in the Last Year: Never true  Transportation Needs: No Transportation Needs (07/15/2023)   PRAPARE - Administrator, Civil Service (Medical): No    Lack of Transportation (Non-Medical): No  Physical Activity: Not on file  Stress: Not on file  Social Connections: Unknown (11/26/2021)   [facility] from [facility]   Social Network    Social Network: Not on file  Intimate Partner Violence: Not At Risk (07/15/2023)    Humiliation, Afraid, Rape, and Kick questionnaire    Fear of Current or Ex-Partner: No    Emotionally Abused: No    Physically Abused: No    Sexually Abused: No     Physical Exam   Vitals:   07/23/23 2115 07/23/23 2215 07/23/23 2218 07/23/23 2315  BP: (!) 139/99 (!) 149/101    Pulse: (!) 110 (!) 103  91  Resp: (!) 30 18 (!) 27 (!) 25  Temp:      TempSrc:      SpO2: 95% 100% 100% 99%    Physical Exam Vitals and nursing note reviewed.  Constitutional:      General:  She is not in acute distress.    Appearance: She is well-developed.  HENT:     Head: Normocephalic and atraumatic.     Right Ear: External ear normal.     Left Ear: External ear normal.     Nose: Nose normal. No congestion.     Mouth/Throat:     Mouth: Mucous membranes are moist.  Eyes:     Conjunctiva/sclera: Conjunctivae normal.  Cardiovascular:     Rate and Rhythm: Normal rate and regular rhythm.     Pulses: Normal pulses.     Heart sounds: No murmur heard. Pulmonary:     Effort: No respiratory distress.     Comments: Scattered wheezing and crackles, mild tachypnea Abdominal:     Palpations: Abdomen is soft.     Tenderness: There is no abdominal tenderness.  Musculoskeletal:     Cervical back: Neck supple.  Skin:    General: Skin is warm and dry.     Capillary Refill: Capillary refill takes less than 2 seconds.     Comments: Left arm AV fistula  Neurological:     Mental Status: She is alert and oriented to person, place, and time.     Comments: Patient has marked atrophy of her lower extremities, her legs are crossed at baseline, she does have a small little mass on her left lower back near her spine.      Procedures   If procedures were preformed on this patient, they are listed below:  Procedures  The patient was seen, evaluated, and treated in conjunction with the attending physician, who voiced agreement in the care provided.  Note generated using Dragon voice dictation software and may  contain dictation errors. Please contact me for any clarification or with any questions.   Electronically signed by:  Osvaldo Shipper, M.D. (PGY-2)    Gunnar Bulla, MD 07/24/23 Newton Pigg    Derwood Kaplan, MD 07/25/23 0630

## 2023-07-23 NOTE — Progress Notes (Signed)
ED Pharmacy Antibiotic Sign Off An antibiotic consult was received from an ED provider for vancomycin and zosyn per pharmacy dosing for pneumonia. A chart review was completed to assess appropriateness.  The following one time order(s) were placed per pharmacy consult:  zosyn 2.25g x 1 dose vancomycin 750 mg x 1 dose  Further antibiotic and/or antibiotic pharmacy consults should be ordered by the admitting provider if indicated.   Thank you for allowing pharmacy to be a part of this patient's care.   Delmar Landau, PharmD, BCPS 07/23/2023 10:52 PM ED Clinical Pharmacist -  [phone]

## 2023-07-24 DIAGNOSIS — I5082 Biventricular heart failure: Secondary | ICD-10-CM | POA: Diagnosis present

## 2023-07-24 DIAGNOSIS — N186 End stage renal disease: Secondary | ICD-10-CM | POA: Diagnosis present

## 2023-07-24 DIAGNOSIS — J9811 Atelectasis: Secondary | ICD-10-CM | POA: Diagnosis not present

## 2023-07-24 DIAGNOSIS — Z992 Dependence on renal dialysis: Secondary | ICD-10-CM | POA: Diagnosis not present

## 2023-07-24 DIAGNOSIS — J189 Pneumonia, unspecified organism: Secondary | ICD-10-CM | POA: Diagnosis present

## 2023-07-24 DIAGNOSIS — I3139 Other pericardial effusion (noninflammatory): Secondary | ICD-10-CM | POA: Diagnosis present

## 2023-07-24 DIAGNOSIS — I5033 Acute on chronic diastolic (congestive) heart failure: Secondary | ICD-10-CM

## 2023-07-24 DIAGNOSIS — I5030 Unspecified diastolic (congestive) heart failure: Secondary | ICD-10-CM | POA: Diagnosis not present

## 2023-07-24 DIAGNOSIS — Z993 Dependence on wheelchair: Secondary | ICD-10-CM | POA: Diagnosis not present

## 2023-07-24 DIAGNOSIS — D631 Anemia in chronic kidney disease: Secondary | ICD-10-CM | POA: Diagnosis present

## 2023-07-24 DIAGNOSIS — D649 Anemia, unspecified: Secondary | ICD-10-CM | POA: Diagnosis not present

## 2023-07-24 DIAGNOSIS — I050 Rheumatic mitral stenosis: Secondary | ICD-10-CM | POA: Diagnosis present

## 2023-07-24 DIAGNOSIS — F17210 Nicotine dependence, cigarettes, uncomplicated: Secondary | ICD-10-CM | POA: Diagnosis present

## 2023-07-24 DIAGNOSIS — Y95 Nosocomial condition: Secondary | ICD-10-CM | POA: Diagnosis present

## 2023-07-24 DIAGNOSIS — J90 Pleural effusion, not elsewhere classified: Secondary | ICD-10-CM | POA: Diagnosis not present

## 2023-07-24 DIAGNOSIS — I5031 Acute diastolic (congestive) heart failure: Secondary | ICD-10-CM | POA: Diagnosis not present

## 2023-07-24 DIAGNOSIS — R0602 Shortness of breath: Secondary | ICD-10-CM | POA: Diagnosis not present

## 2023-07-24 DIAGNOSIS — N179 Acute kidney failure, unspecified: Secondary | ICD-10-CM | POA: Diagnosis present

## 2023-07-24 DIAGNOSIS — Q059 Spina bifida, unspecified: Secondary | ICD-10-CM | POA: Diagnosis not present

## 2023-07-24 DIAGNOSIS — N2581 Secondary hyperparathyroidism of renal origin: Secondary | ICD-10-CM | POA: Diagnosis present

## 2023-07-24 DIAGNOSIS — Z1152 Encounter for screening for COVID-19: Secondary | ICD-10-CM | POA: Diagnosis not present

## 2023-07-24 DIAGNOSIS — E8770 Fluid overload, unspecified: Secondary | ICD-10-CM | POA: Diagnosis not present

## 2023-07-24 DIAGNOSIS — I517 Cardiomegaly: Secondary | ICD-10-CM | POA: Diagnosis not present

## 2023-07-24 DIAGNOSIS — F32A Depression, unspecified: Secondary | ICD-10-CM | POA: Diagnosis present

## 2023-07-24 DIAGNOSIS — M7989 Other specified soft tissue disorders: Secondary | ICD-10-CM | POA: Diagnosis not present

## 2023-07-24 DIAGNOSIS — Z79899 Other long term (current) drug therapy: Secondary | ICD-10-CM | POA: Diagnosis not present

## 2023-07-24 DIAGNOSIS — D539 Nutritional anemia, unspecified: Secondary | ICD-10-CM | POA: Diagnosis present

## 2023-07-24 DIAGNOSIS — E875 Hyperkalemia: Secondary | ICD-10-CM | POA: Diagnosis present

## 2023-07-24 DIAGNOSIS — Z6841 Body Mass Index (BMI) 40.0 and over, adult: Secondary | ICD-10-CM | POA: Diagnosis not present

## 2023-07-24 DIAGNOSIS — J45909 Unspecified asthma, uncomplicated: Secondary | ICD-10-CM | POA: Diagnosis present

## 2023-07-24 DIAGNOSIS — I2729 Other secondary pulmonary hypertension: Secondary | ICD-10-CM | POA: Diagnosis present

## 2023-07-24 DIAGNOSIS — D696 Thrombocytopenia, unspecified: Secondary | ICD-10-CM | POA: Diagnosis present

## 2023-07-24 DIAGNOSIS — R0600 Dyspnea, unspecified: Secondary | ICD-10-CM | POA: Diagnosis not present

## 2023-07-24 DIAGNOSIS — I132 Hypertensive heart and chronic kidney disease with heart failure and with stage 5 chronic kidney disease, or end stage renal disease: Secondary | ICD-10-CM | POA: Diagnosis present

## 2023-07-24 DIAGNOSIS — Z7401 Bed confinement status: Secondary | ICD-10-CM | POA: Diagnosis not present

## 2023-07-24 DIAGNOSIS — J9601 Acute respiratory failure with hypoxia: Secondary | ICD-10-CM | POA: Diagnosis present

## 2023-07-24 LAB — CBC
HCT: 32.8 % — ABNORMAL LOW (ref 36.0–46.0)
Hemoglobin: 9.8 g/dL — ABNORMAL LOW (ref 12.0–15.0)
MCH: 31.7 pg (ref 26.0–34.0)
MCHC: 29.9 g/dL — ABNORMAL LOW (ref 30.0–36.0)
MCV: 106.1 fL — ABNORMAL HIGH (ref 80.0–100.0)
Platelets: 71 10*3/uL — ABNORMAL LOW (ref 150–400)
RBC: 3.09 MIL/uL — ABNORMAL LOW (ref 3.87–5.11)
RDW: 17.9 % — ABNORMAL HIGH (ref 11.5–15.5)
WBC: 6.4 10*3/uL (ref 4.0–10.5)
nRBC: 0.8 % — ABNORMAL HIGH (ref 0.0–0.2)

## 2023-07-24 LAB — COMPREHENSIVE METABOLIC PANEL
ALT: 20 U/L (ref 0–44)
AST: 38 U/L (ref 15–41)
Albumin: 3.3 g/dL — ABNORMAL LOW (ref 3.5–5.0)
Alkaline Phosphatase: 124 U/L (ref 38–126)
Anion gap: 16 — ABNORMAL HIGH (ref 5–15)
BUN: 20 mg/dL (ref 6–20)
CO2: 24 mmol/L (ref 22–32)
Calcium: 8.9 mg/dL (ref 8.9–10.3)
Chloride: 95 mmol/L — ABNORMAL LOW (ref 98–111)
Creatinine, Ser: 3.92 mg/dL — ABNORMAL HIGH (ref 0.44–1.00)
GFR, Estimated: 15 mL/min — ABNORMAL LOW (ref >60)
Glucose, Bld: 88 mg/dL (ref 70–99)
Potassium: 4.6 mmol/L (ref 3.5–5.1)
Sodium: 135 mmol/L (ref 135–145)
Total Bilirubin: 1.5 mg/dL — ABNORMAL HIGH (ref <1.2)
Total Protein: 7.6 g/dL (ref 6.5–8.1)

## 2023-07-24 LAB — CBG MONITORING, ED: Glucose-Capillary: 71 mg/dL (ref 70–99)

## 2023-07-24 LAB — GLUCOSE, RANDOM: Glucose, Bld: 80 mg/dL (ref 70–99)

## 2023-07-24 MED ORDER — HEPARIN SODIUM (PORCINE) 5000 UNIT/ML IJ SOLN
5000.0000 [IU] | Freq: Three times a day (TID) | INTRAMUSCULAR | Status: DC
  Filled 2023-07-24: qty 1

## 2023-07-24 MED ORDER — PIPERACILLIN-TAZOBACTAM IN DEX 2-0.25 GM/50ML IV SOLN
2.2500 g | Freq: Three times a day (TID) | INTRAVENOUS | Status: DC
Start: 2023-07-24 — End: 2023-07-27
  Administered 2023-07-24 – 2023-07-27 (×9): 2.25 g via INTRAVENOUS
  Filled 2023-07-24 (×13): qty 50

## 2023-07-24 MED ORDER — HYDRALAZINE HCL 25 MG PO TABS
25.0000 mg | ORAL_TABLET | Freq: Every day | ORAL | Status: DC
  Administered 2023-07-25 – 2023-07-27 (×3): 25 mg via ORAL
  Filled 2023-07-24 (×3): qty 1

## 2023-07-24 MED ORDER — HEPARIN SODIUM (PORCINE) 5000 UNIT/ML IJ SOLN
5000.0000 [IU] | Freq: Two times a day (BID) | INTRAMUSCULAR | Status: DC
  Filled 2023-07-24 (×3): qty 1

## 2023-07-24 MED ORDER — PANTOPRAZOLE SODIUM 40 MG PO TBEC
40.0000 mg | DELAYED_RELEASE_TABLET | Freq: Every day | ORAL | Status: DC
Start: 2023-07-25 — End: 2023-07-27
  Administered 2023-07-25 – 2023-07-27 (×3): 40 mg via ORAL
  Filled 2023-07-24 (×3): qty 1

## 2023-07-24 MED ORDER — ONDANSETRON HCL 4 MG/2ML IJ SOLN
4.0000 mg | Freq: Four times a day (QID) | INTRAMUSCULAR | Status: DC | PRN
  Filled 2023-07-24: qty 2

## 2023-07-24 MED ORDER — ALBUTEROL SULFATE HFA 108 (90 BASE) MCG/ACT IN AERS: 1.0000 | INHALATION_SPRAY | Freq: Four times a day (QID) | RESPIRATORY_TRACT | Status: DC | PRN

## 2023-07-24 MED ORDER — ONDANSETRON HCL 4 MG PO TABS
4.0000 mg | ORAL_TABLET | Freq: Four times a day (QID) | ORAL | Status: DC | PRN
  Administered 2023-07-25 – 2023-07-27 (×2): 4 mg via ORAL
  Filled 2023-07-24 (×2): qty 1

## 2023-07-24 MED ORDER — VANCOMYCIN HCL 750 MG/150ML IV SOLN
750.0000 mg | Freq: Once | INTRAVENOUS | Status: AC
  Administered 2023-07-24: 750 mg via INTRAVENOUS
  Filled 2023-07-24: qty 150

## 2023-07-24 MED ORDER — METOPROLOL SUCCINATE ER 100 MG PO TB24
100.0000 mg | ORAL_TABLET | Freq: Every day | ORAL | Status: DC
  Administered 2023-07-24 – 2023-07-27 (×4): 100 mg via ORAL
  Filled 2023-07-24 (×2): qty 1
  Filled 2023-07-24: qty 4
  Filled 2023-07-24: qty 1

## 2023-07-24 MED ORDER — ALBUTEROL SULFATE (2.5 MG/3ML) 0.083% IN NEBU
2.5000 mg | INHALATION_SOLUTION | Freq: Four times a day (QID) | RESPIRATORY_TRACT | Status: DC | PRN
  Administered 2023-07-24 – 2023-07-26 (×5): 2.5 mg via RESPIRATORY_TRACT
  Filled 2023-07-24 (×6): qty 3

## 2023-07-24 MED ORDER — ACETAMINOPHEN 650 MG RE SUPP
650.0000 mg | Freq: Four times a day (QID) | RECTAL | Status: DC | PRN
Start: 2023-07-24 — End: 2023-07-27

## 2023-07-24 MED ORDER — VANCOMYCIN VARIABLE DOSE PER UNSTABLE RENAL FUNCTION (PHARMACIST DOSING): Status: DC

## 2023-07-24 MED ORDER — ACETAMINOPHEN 325 MG PO TABS
650.0000 mg | ORAL_TABLET | Freq: Four times a day (QID) | ORAL | Status: DC | PRN
  Administered 2023-07-24 – 2023-07-27 (×5): 650 mg via ORAL
  Filled 2023-07-24 (×5): qty 2

## 2023-07-24 MED ORDER — ACETAMINOPHEN 500 MG PO TABS: 1000.0000 mg | ORAL_TABLET | Freq: Four times a day (QID) | ORAL | Status: DC | PRN

## 2023-07-24 NOTE — ED Notes (Signed)
Patient placed on 2L of O2 due to sats resulting in 84%

## 2023-07-24 NOTE — Progress Notes (Signed)
Pharmacy Antibiotic Note  [name] is a 32 y.o. female with spina bifida and ESRD on HD MWF admitted on 07/23/2023 with SOB/PNA.  Pharmacy has been consulted for Vancomycin and Zosyn  dosing.  Plan: Vancomycin 750 mg IV as ordered in ED F/U levels for subsequent dosing. Zosyn 2.25 g IV q12h     Temp (24hrs), Avg:100.4 F (38 C), Min:100 F (37.8 C), Max:100.8 F (38.2 C)  Recent Labs  Lab 07/23/23 2000  WBC 7.6  CREATININE 7.34*    Estimated Creatinine Clearance: 1.2 mL/min (A) (by C-G formula based on SCr of 7.34 mg/dL (H)).    Allergies  Allergen Reactions   Ciprofloxacin Shortness Of Breath, Nausea And Vomiting and Other (See Comments)    HIGH FEVER and oral blisters    Other Anaphylaxis    Revaclear dialzer   Peanut-Containing Drug Products Anaphylaxis   Aleve [Naproxen Sodium] Other (See Comments)    G.I.Bleed   Ceftriaxone Other (See Comments)    Blisters in mouth    Coconut (Cocos Nucifera) Hives   Influenza Vaccines Nausea And Vomiting and Other (See Comments)    High fever   Tetanus Toxoid, Adsorbed Nausea And Vomiting and Other (See Comments)    HIGH FEVER, also   Tetanus Toxoids Nausea And Vomiting and Other (See Comments)    HIGH FEVER   Latex Itching and Rash     Eddie Candle 07/24/2023 3:24 AM

## 2023-07-24 NOTE — ED Notes (Signed)
Patient placed onto cardiac monitor

## 2023-07-24 NOTE — ED Notes (Signed)
Cmp redraw sent at this time

## 2023-07-24 NOTE — Progress Notes (Signed)
[facility]                                                                               [name], is a 32 y.o. female, DOB - 1991-02-01, NFA:[id number] Admit date - 07/23/2023    Outpatient Primary MD for the patient is Dimas Aguas, Binnie Rail, FNP  LOS - 0  days    Brief summary    [name] is a 32 y.o. 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  She was admitted for acute respiratory failure with h ypoxia secondary to pneumonia and   Assessment & Plan    Assessment and Plan:    Acute respiratory failure with hypoxia secondary to pneumonia and fluid overload.  Febrile, tachycardic, tachypnea on  admission.  Started on IV antibiotics and HD session on admission.  Nephrology on board.  Monitor.    Estimated body mass index is 38.03 kg/m as calculated from the following:   Height as of 07/15/23: 3' (0.914 m).   Weight as of 07/15/23: 31.8 kg.  Code Status: full code.  DVT Prophylaxis:  heparin injection 5,000 Units Start: 07/24/23 1000   Level of Care: Level of care: Progressive Family Communication: none at bedside.   Disposition Plan:     Remains inpatient appropriate:  dyspnea and hypoxia.   Procedures:  HD overnight.   Consultants:   Nephrology.   Antimicrobials:   Anti-infectives (From admission, onward)    Start     Dose/Rate Route Frequency Ordered Stop   07/24/23 0715  piperacillin-tazobactam (ZOSYN) IVPB 2.25 g        2.25 g 100 mL/hr over 30 Minutes Intravenous Every 8 hours 07/24/23 0712     07/24/23 0328  vancomycin variable dose per unstable renal function (pharmacist dosing)  Status:  Discontinued         Does not apply See admin instructions 07/24/23 0329 07/24/23 1047   07/24/23 0300  vancomycin (VANCOREADY) IVPB  750 mg/150 mL        750 mg 150 mL/hr over 60 Minutes Intravenous  Once 07/24/23 0204 07/24/23 0737   07/24/23 0000  vancomycin (VANCOCIN) 750 mg in sodium chloride 0.9 % 250 mL IVPB  Status:  Discontinued        750 mg 265 mL/hr over 60 Minutes Intravenous  Once 07/23/23 2357 07/24/23 0203   07/23/23 2300  piperacillin-tazobactam (ZOSYN) IVPB 2.25 g        2.25 g 100 mL/hr over 30 Minutes Intravenous  Once 07/23/23 2252 07/24/23 0737   07/23/23 2300  vancomycin (VANCOREADY) IVPB 750 mg/150 mL  Status:  Discontinued        750 mg 150 mL/hr over 60 Minutes Intravenous  Once 07/23/23 2252 07/23/23 2357        Medications  Scheduled Meds:  Chlorhexidine Gluconate Cloth  6 each Topical Q0600   heparin injection (subcutaneous)  5,000 Units Subcutaneous Q12H   [START ON 07/25/2023] hydrALAZINE  25 mg Oral Daily   metoprolol succinate  100 mg Oral Daily   [  START ON 07/25/2023] pantoprazole  40 mg Oral Daily   Continuous Infusions:  piperacillin-tazobactam (ZOSYN)  IV Stopped (07/24/23 0858)   PRN Meds:.acetaminophen **OR** acetaminophen, albuterol, ondansetron **OR** ondansetron (ZOFRAN) IV    Subjective:   [name] was seen and examined today.    Objective:   Vitals:   07/24/23 0442 07/24/23 0954 07/24/23 1300 07/24/23 1418  BP: 122/82 123/87 116/62   Pulse: 91 94 84   Resp: (!) 31 (!) 27 18   Temp: 98.8 F (37.1 C) 98.4 F (36.9 C)  98.3 F (36.8 C)  TempSrc: Oral Oral  Oral  SpO2: 96% 94% 97%     Intake/Output Summary (Last 24 hours) at 07/24/2023 1511 Last data filed at 07/24/2023 0858 Gross per 24 hour  Intake 250 ml  Output 1600 ml  Net -1350 ml   There were no vitals filed for this visit.   Exam General exam: Appears calm and comfortable  Respiratory system: Clear to auscultation. Respiratory effort normal. Cardiovascular system: S1 & S2 heard, RRR. Gastrointestinal system: Abdomen is nondistended, soft and nontender.  Central nervous system:  Alert and oriented.    Data Reviewed:  I have personally reviewed following labs and imaging studies   CBC Lab Results  Component Value Date   WBC 6.4 07/24/2023   RBC 3.09 (L) 07/24/2023   HGB 9.8 (L) 07/24/2023   HCT 32.8 (L) 07/24/2023   MCV 106.1 (H) 07/24/2023   MCH 31.7 07/24/2023   PLT 71 (L) 07/24/2023   MCHC 29.9 (L) 07/24/2023   RDW 17.9 (H) 07/24/2023   LYMPHSABS 0.6 (L) 07/23/2023   MONOABS 1.1 (H) 07/23/2023   EOSABS 0.1 07/23/2023   BASOSABS 0.0 07/23/2023     Last metabolic panel Lab Results  Component Value Date   NA 135 07/24/2023   K 4.6 07/24/2023   CL 95 (L) 07/24/2023   CO2 24 07/24/2023   BUN 20 07/24/2023   CREATININE 3.92 (H) 07/24/2023   GLUCOSE 88 07/24/2023   GFRNONAA 15 (L) 07/24/2023   GFRAA 9 (L) 04/30/2020   CALCIUM 8.9 07/24/2023   PHOS 4.6 07/15/2023   PROT 7.6 07/24/2023   ALBUMIN 3.3 (L) 07/24/2023   BILITOT 1.5 (H) 07/24/2023   ALKPHOS 124 07/24/2023   AST 38 07/24/2023   ALT 20 07/24/2023   ANIONGAP 16 (H) 07/24/2023    CBG (last 3)  Recent Labs    07/23/23 2243 07/24/23 0913  GLUCAP 100* 71      Coagulation Profile: No results for input(s): "INR", "PROTIME" in the last 168 hours.   Radiology Studies: DG Chest Portable 1 View Result Date: 07/23/2023 CLINICAL DATA:  fluid EXAM: PORTABLE CHEST 1 VIEW COMPARISON:  Chest x-ray 07/15/2023, cxr 05/30/23, ct abd/pelvis 05/30/23 FINDINGS: Persistent enlarged cardiac silhouette. The heart and mediastinal contours are unchanged. Low lung volumes. Question right middle lobe consolidation. No pulmonary edema. Persistent loculated right pleural effusion-at least small volume. No pneumothorax. No acute osseous abnormality. IMPRESSION: 1. Low lung volumes. 2. Persistent enlarged cardiac silhouette with underlying pericardial effusion not excluded. 3. Persistent loculated right pleural effusion-at least small volume. 4. Question right middle lobe consolidation. Electronically Signed    By: Tish Frederickson M.D.   On: 07/23/2023 20:17   ECHOCARDIOGRAM COMPLETE Result Date: 07/23/2023    ECHOCARDIOGRAM REPORT   Patient Name:   [name] Date of Exam: 07/23/2023 Medical Rec #:  [id number]        Height:       36.0 in  Accession #:    [id number]       Weight:       70.1 lb Date of Birth:  08/21/1990        BSA:          0.825 m Patient Age:    32 years         BP:           116/85 mmHg Patient Gender: F                HR:           92 bpm. Exam Location:  Outpatient Procedure: 2D Echo, Color Doppler and Cardiac Doppler Indications:    I50.9 Congestive Heart Failure  History:        Patient has prior history of Echocardiogram examinations. CHF,                 ESRD, Mitral Stenosis; Risk Factors:Hypertension.  Sonographer:    L. Thornton-Maynard, RDCS Referring Phys: [id number] [facility]  Sonographer Comments: Patient very short of breath during exam. Patient went to ED following the test IMPRESSIONS  1. COmpared to echo from Dec 2023, estimated pulmonary pressure is increased.  2. Septal flattening consistent with RV volume / pressure overload     Narrow LVOT (12 mm) with turbulent outflow Turbulent flow through LV, LVOT with near cavity obliteration No significant gradient at rest.. Left ventricular ejection fraction, by estimation, is >75%. The left ventricle has hyperdynamic function. The left ventricle has no regional wall motion abnormalities. There is mild concentric left ventricular hypertrophy. Left ventricular diastolic parameters are consistent with Grade II diastolic dysfunction (pseudonormalization). Elevated left atrial pressure.  3. The right ventricular size is moderately enlarged. There is severely elevated pulmonary artery systolic pressure.  4. Left atrial size was moderately dilated.  5. Right atrial size was moderately dilated.  6. A small pericardial effusion is present.  7. Mitral valve is thickened, calcified. Mean gradient through the valve is 13 mmHG (HR 90 bpm)  Overall consistent with severe MS COmpared to echo from Dec 2023, mean gradient is increased. Mild mitral valve regurgitation. Severe mitral annular calcification.  8. The tricuspid valve is degenerative. Tricuspid valve regurgitation is severe.  9. The aortic valve is tricuspid. Aortic valve regurgitation is not visualized. Aortic valve sclerosis is present, with no evidence of aortic valve stenosis. 10. Pulmonic valve regurgitation is moderate. FINDINGS  Left Ventricle: Septal flattening consistent with RV volume / pressure overload Narrow LVOT (12 mm) with turbulent outflow Turbulent flow through LV, LVOT with near cavity obliteration No significant gradient at rest. Left ventricular ejection fraction, by estimation, is >75%. The left ventricle has hyperdynamic function. The left ventricle has no regional wall motion abnormalities. The left ventricular internal cavity size was small. There is mild concentric left ventricular hypertrophy. Left ventricular diastolic parameters are consistent with Grade II diastolic dysfunction (pseudonormalization). Elevated left atrial pressure. Right Ventricle: The right ventricular size is moderately enlarged. Right vetricular wall thickness was not assessed. There is severely elevated pulmonary artery systolic pressure. The tricuspid regurgitant velocity is 5.59 m/s, and with an assumed right  atrial pressure of 15 mmHg, the estimated right ventricular systolic pressure is 140.0 mmHg. Left Atrium: Left atrial size was moderately dilated. Right Atrium: Right atrial size was moderately dilated. Pericardium: A small pericardial effusion is present. Mitral Valve: Mitral valve is thickened, calcified. Mean gradient through the valve is 13 mmHG (HR 90 bpm) Overall consistent with severe MS  COmpared to echo from Dec 2023, mean gradient is increased. There is moderate thickening of the mitral valve leaflet(s). There is severe calcification of the mitral valve leaflet(s). Severe mitral  annular calcification. Mild mitral valve regurgitation. MV peak gradient, 19.0 mmHg. The mean mitral valve gradient is 14.0 mmHg. Tricuspid Valve: The tricuspid valve is degenerative in appearance. Tricuspid valve regurgitation is severe. Aortic Valve: The aortic valve is tricuspid. Aortic valve regurgitation is not visualized. Aortic valve sclerosis is present, with no evidence of aortic valve stenosis. Aortic valve mean gradient measures 9.0 mmHg. Aortic valve peak gradient measures 17.1 mmHg. Aortic valve area, by VTI measures 0.95 cm. Pulmonic Valve: The pulmonic valve was grossly normal. Pulmonic valve regurgitation is moderate. Aorta: The aortic root and ascending aorta are structurally normal, with no evidence of dilitation. IAS/Shunts: No atrial level shunt detected by color flow Doppler.  LEFT VENTRICLE PLAX 2D LVIDd:         2.60 cm     Diastology LVIDs:         1.40 cm     LV e' medial:    4.70 cm/s LV PW:         1.20 cm     LV E/e' medial:  47.0 LV IVS:        1.20 cm     LV e' lateral:   4.87 cm/s LVOT diam:     1.25 cm     LV E/e' lateral: 45.4 LV SV:         27 LV SV Index:   32 LVOT Area:     1.23 cm  LV Volumes (MOD) LV vol d, MOD A2C: 25.7 ml LV vol d, MOD A4C: 30.0 ml LV vol s, MOD A2C: 6.1 ml LV vol s, MOD A4C: 6.8 ml LV SV MOD A2C:     19.6 ml LV SV MOD A4C:     30.0 ml LV SV MOD BP:      24.6 ml RIGHT VENTRICLE             IVC RV Basal diam:  4.00 cm     IVC diam: 2.40 cm RV Mid diam:    3.20 cm RV S prime:     10.20 cm/s TAPSE (M-mode): 1.2 cm LEFT ATRIUM             Index        RIGHT ATRIUM           Index LA diam:        4.20 cm 5.09 cm/m   RA Area:     15.60 cm LA Vol (A2C):   45.4 ml 55.01 ml/m  RA Volume:   40.80 ml  49.44 ml/m LA Vol (A4C):   30.0 ml 36.35 ml/m LA Biplane Vol: 37.8 ml 45.80 ml/m  AORTIC VALVE                     PULMONIC VALVE AV Area (Vmax):    0.97 cm      PV Vmax:          1.07 m/s AV Area (Vmean):   1.11 cm      PV Peak grad:     4.6 mmHg AV Area (VTI):      0.95 cm      PR End Diast Vel: 3.18 msec AV Vmax:           207.00 cm/s AV Vmean:  135.000 cm/s AV VTI:            0.282 m AV Peak Grad:      17.1 mmHg AV Mean Grad:      9.0 mmHg LVOT Vmax:         164.00 cm/s LVOT Vmean:        122.000 cm/s LVOT VTI:          0.218 m LVOT/AV VTI ratio: 0.77  AORTA Ao Root diam: 1.95 cm Ao Asc diam:  2.20 cm MITRAL VALVE                TRICUSPID VALVE MV Area (PHT): 4.60 cm     TR Peak grad:   125.0 mmHg MV Area VTI:   0.53 cm     TR Vmax:        559.00 cm/s MV Peak grad:  19.0 mmHg MV Mean grad:  14.0 mmHg    SHUNTS MV Vmax:       2.18 m/s     Systemic VTI:  0.22 m MV Vmean:      179.0 cm/s   Systemic Diam: 1.25 cm MV Decel Time: 165 msec MV E velocity: 221.00 cm/s MV A velocity: 209.00 cm/s MV E/A ratio:  1.06 Dietrich Pates MD Electronically signed by Dietrich Pates MD Signature Date/Time: 07/23/2023/6:26:47 PM    Final        Kathlen Mody M.D. [facility] 07/24/2023, 3:11 PM  Available via Epic secure chat 7am-7pm After 7 pm, please refer to night coverage provider listed on amion.

## 2023-07-24 NOTE — Progress Notes (Signed)
   07/24/23 2238  BiPAP/CPAP/SIPAP  Reason BIPAP/CPAP not in use Non-compliant   Pt refused CPAP for nighttime use.

## 2023-07-24 NOTE — H&P (Addendum)
History and Physical    [name] EXB:[id number] DOB: [birth year] DOA: 07/23/2023  PCP: Park Meo, FNP  Patient coming from: Echo lab  I have personally briefly reviewed patient's old medical records in [facility]  Chief Complaint: sob  HPI: [name] is a 32 y.o. 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 Of note patient has interim history of  last HD session  on 07/20/23 due to Holiday schedule. Patient has HD MWF but due to holiday schedule he last HD was Monday with plans for HD on today day of admission.  Patient now presents with sob with increase wob requiring bipap. Of note patient was recently hospitalized for similar presentation with discharged on 07/16/23. At which time patient was thought  to have volume overload/HFpEF exacerbation in the setting of  being under-dialyzed due to HD being cut short. Patient also of note had echo updated on 2/26 which  was concerning for volume overload due to septal flattening, as well as worsening pulmonary hypertension with EF was estimated at 75%..Patient currently also endorses cough  and fever that has been progressive. Patient states she has similar cough on her last admission but noted it has improved prior to discharge.  Patient notes no know sick contacts, she does notes she feels improved since treatment in ED and being on HD. She notes pleuritic type chest pain with coughing.  She notes no current n/v/d.   ED Course:  Patient ED course complicated by spike in temp to 100.8 as well as continued increase wob for which patient as placed on bipap. S/p work up that revealed fluid overload as well as probable pneumonia patient was slated for expedited HD in setting associated acute hypoxic respiratory failure.    Vitals:  Temp 100.8, BP 136/86,  hr 100 rr 26,  sat 100%  on 8L simple mask  Labs  Wbc 7.6, hgb 10.7( at baseline ) Mcv 105, plt 128,  Na 139, 5.7, cl 97, bicarb 21, cr 7.34 grf7 AG 21 BNP 2478.9( 1979) Mag 1.9 EKG:  nsr RVH  no acute changes RVP: neg Tx: tylenol , albuterol, vanc, novolog  10 units , D50,  Zosyn 2.25 Review of Systems: As per HPI otherwise 10 point review of systems negative.   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   Depression 05/14/2015   Dialysis care    ESRD (end stage renal disease) on dialysis [facility])    GERD (gastroesophageal reflux disease) 01/07/2017   HTN (hypertension) 05/02/2011   Murmur, cardiac 07/17/2022   Spina bifida    UTI (lower urinary tract infection)    Viral URI with cough 09/15/2022    Past Surgical History:  Procedure Laterality Date   A/V FISTULAGRAM Left 10/14/2021   Procedure: A/V Fistulagram;  Surgeon: Maeola Harman, MD;  Location: [facility] INVASIVE CV LAB;  Service: Cardiovascular;  Laterality: Left;   AV FISTULA PLACEMENT     left arm   INSERTION OF DIALYSIS CATHETER N/A 05/08/2020   Procedure: ATTEMPTED, UNSUCCESSFUL INSERTION OF DIALYSIS CATHETER TUNNELED RIGHT INTERNAL JUGULAR;  Surgeon: Lucretia Roers, MD;  Location: [facility] ORS;  Service: General;  Laterality: N/A;  IR FLUORO GUIDE CV LINE RIGHT  05/09/2020   IR US GUIDE VASC ACCESS RIGHT  05/09/2020   RIGHT HEART CATH N/A 06/30/2023   Procedure: RIGHT HEART CATH;  Surgeon: Laurey Morale, MD;  Location: [facility] INVASIVE CV LAB;  Service: Cardiovascular;  Laterality: N/A;     reports that she has been smoking cigarettes. She has a 2.8 pack-year smoking history. She has been exposed to tobacco smoke. She has never used smokeless tobacco. She reports current drug use. Drug: Marijuana. She reports that she  does not drink alcohol.  Allergies  Allergen Reactions   Ciprofloxacin Shortness Of Breath, Nausea And Vomiting and Other (See Comments)    HIGH FEVER and oral blisters    Other Anaphylaxis    Revaclear dialzer   Peanut-Containing Drug Products Anaphylaxis   Aleve [Naproxen Sodium] Other (See Comments)    G.I.Bleed   Ceftriaxone Other (See Comments)    Blisters in mouth    Coconut (Cocos Nucifera) Hives   Influenza Vaccines Nausea And Vomiting and Other (See Comments)    High fever   Tetanus Toxoid, Adsorbed Nausea And Vomiting and Other (See Comments)    HIGH FEVER, also   Tetanus Toxoids Nausea And Vomiting and Other (See Comments)    HIGH FEVER   Latex Itching and Rash    Family History  Problem Relation Age of Onset   Kidney cancer Other    Hypertension Maternal Grandmother    Arthritis Maternal Grandmother    Breast cancer Maternal Aunt    Colon cancer Neg Hx     Prior to Admission medications   Medication Sig Start Date End Date Taking? Authorizing Provider  acetaminophen (TYLENOL) 500 MG tablet Take 1,000 mg by mouth every 6 (six) hours as needed for mild pain or headache.   Yes [provider]  albuterol (VENTOLIN HFA) 108 (90 Base) MCG/ACT inhaler Inhale 1-2 puffs into the lungs every 6 (six) hours as needed for wheezing or shortness of breath. 09/15/22  Yes Howard, Amber S, FNP  AURYXIA 1 GM 210 MG(Fe) tablet Take 420 mg by mouth 2 (two) times daily with a meal. 04/15/18  Yes [provider]  B Complex-C-Folic Acid (DIALYVITE TABLET) TABS Take 1 tablet by mouth daily.   Yes [provider]  diphenhydrAMINE (BENADRYL) 25 mg capsule Take 25 mg by mouth daily as needed for allergies.   Yes [provider]  EPINEPHrine 0.3 mg/0.3 mL IJ SOAJ injection Inject 0.3 mg into the muscle as needed for anaphylaxis. 04/23/23  Yes Howard, Amber S, FNP  hydrALAZINE (APRESOLINE) 25 MG tablet Take 25 mg by mouth daily. 04/28/20  Yes [provider]  metoprolol succinate (TOPROL-XL) 100 MG 24 hr tablet Take 100 mg by mouth daily. 12/29/19  Yes [provider]  omeprazole (PRILOSEC) 40 MG capsule Take 40 mg by mouth in the morning and at bedtime. 01/08/22  Yes [provider]  sodium zirconium cyclosilicate (LOKELMA) 5 g packet Take 10 g by mouth every Monday, Wednesday, and Friday. 07/20/21  Yes [provider]    Physical Exam: Vitals:   07/23/23 2215 07/23/23 2218 07/23/23 2315 07/24/23 0015  BP: (!) 149/101   123/83  Pulse: (!) 103  91 90  Resp: 18 (!) 27 (!) 25 (!) 24  Temp:      TempSrc:      SpO2: 100% 100% 99% 98%    Constitutional: NAD, calm, comfortable Vitals:   07/23/23 2215 07/23/23 2218 07/23/23 2315 07/24/23  0015  BP: (!) 149/101   123/83  Pulse: (!) 103  91 90  Resp: 18 (!) 27 (!) 25 (!) 24  Temp:      TempSrc:      SpO2: 100% 100% 99% 98%   Eyes: PERRL, lids and conjunctivae normal ENMT: Mucous membranes are dry.  Neck: normal, supple, no masses, no thyromegaly Respiratory: + rhonchi and congested breath sounds. Normal respiratory effort. No accessory muscle use.  Cardiovascular: Regular rate and rhythm, + murmurs / rubs / gallops. No extremity edema. 2+ pedal pulses.  Abdomen: no tenderness, no masses palpated. No hepatosplenomegaly. Bowel sounds positive.  Musculoskeletal: no clubbing / cyanosis. Dysgenesis of lower lower extremities  Skin: no rashes, lesions, ulcers. No induration Neurologic: CN 2-12 grossly intact. Moving upper extremities Psychiatric: Normal judgment and insight. Alert and oriented x 3. Normal mood.    Labs on Admission: I have personally reviewed following labs and imaging studies  CBC: Recent Labs  Lab 07/23/23 2000  WBC 7.6  NEUTROABS 5.8  HGB 10.7*  HCT 35.4*  MCV 105.0*  PLT 128*   Basic Metabolic Panel: Recent Labs  Lab 07/23/23 2000  NA 139  K 5.7*  CL 97*  CO2 21*  GLUCOSE 73  BUN 56*  CREATININE 7.34*  CALCIUM 9.5   MG 1.9   GFR: Estimated Creatinine Clearance: 1.2 mL/min (A) (by C-G formula based on SCr of 7.34 mg/dL (H)). Liver Function Tests: Recent Labs  Lab 07/23/23 2000  AST 28  ALT 18  ALKPHOS 135*  BILITOT 1.1  PROT 7.9  ALBUMIN 3.7   No results for input(s): "LIPASE", "AMYLASE" in the last 168 hours. No results for input(s): "AMMONIA" in the last 168 hours. Coagulation Profile: No results for input(s): "INR", "PROTIME" in the last 168 hours. Cardiac Enzymes: No results for input(s): "CKTOTAL", "CKMB", "CKMBINDEX", "TROPONINI" in the last 168 hours. BNP (last 3 results) No results for input(s): "PROBNP" in the last 8760 hours. HbA1C: No results for input(s): "HGBA1C" in the last 72 hours. CBG: Recent Labs  Lab 07/23/23 2243  GLUCAP 100*   Lipid Profile: No results for input(s): "CHOL", "HDL", "LDLCALC", "TRIG", "CHOLHDL", "LDLDIRECT" in the last 72 hours. Thyroid Function Tests: No results for input(s): "TSH", "T4TOTAL", "FREET4", "T3FREE", "THYROIDAB" in the last 72 hours. Anemia Panel: No results for input(s): "VITAMINB12", "FOLATE", "FERRITIN", "TIBC", "IRON", "RETICCTPCT" in the last 72 hours. Urine analysis:    Component Value Date/Time   COLORURINE YELLOW 05/31/2023 0435   APPEARANCEUR HAZY (A) 05/31/2023 0435   LABSPEC 1.011 05/31/2023 0435   PHURINE 9.0 (H) 05/31/2023 0435   GLUCOSEU 50 (A) 05/31/2023 0435   HGBUR SMALL (A) 05/31/2023 0435   BILIRUBINUR NEGATIVE 05/31/2023 0435   KETONESUR NEGATIVE 05/31/2023 0435   PROTEINUR >=300 (A) 05/31/2023 0435   UROBILINOGEN 0.2 05/11/2015 0906   NITRITE NEGATIVE 05/31/2023 0435   LEUKOCYTESUR TRACE (A) 05/31/2023 0435    Radiological Exams on Admission: DG Chest Portable 1 View Result Date: 07/23/2023 CLINICAL DATA:  fluid EXAM: PORTABLE CHEST 1 VIEW COMPARISON:  Chest x-ray 07/15/2023, cxr 05/30/23, ct abd/pelvis 05/30/23 FINDINGS: Persistent enlarged cardiac silhouette. The heart and mediastinal contours are  unchanged. Low lung volumes. Question right middle lobe consolidation. No pulmonary edema. Persistent loculated right pleural effusion-at least small volume. No pneumothorax. No acute osseous abnormality. IMPRESSION: 1. Low lung volumes. 2. Persistent enlarged cardiac silhouette with underlying pericardial effusion not excluded. 3. Persistent loculated right pleural effusion-at least small volume. 4. Question right middle lobe consolidation.  Electronically Signed   By: Tish Frederickson M.D.   On: 07/23/2023 20:17   ECHOCARDIOGRAM COMPLETE Result Date: 07/23/2023    ECHOCARDIOGRAM REPORT   Patient Name:   [name] Date of Exam: 07/23/2023 Medical Rec #:  [id number]        Height:       36.0 in Accession #:    [id number]       Weight:       70.1 lb Date of Birth:  1991-04-02        BSA:          0.825 m Patient Age:    32 years         BP:           116/85 mmHg Patient Gender: F                HR:           92 bpm. Exam Location:  Outpatient Procedure: 2D Echo, Color Doppler and Cardiac Doppler Indications:    I50.9 Congestive Heart Failure  History:        Patient has prior history of Echocardiogram examinations. CHF,                 ESRD, Mitral Stenosis; Risk Factors:Hypertension.  Sonographer:    L. Thornton-Maynard, RDCS Referring Phys: [id number] [facility]  Sonographer Comments: Patient very short of breath during exam. Patient went to ED following the test IMPRESSIONS  1. COmpared to echo from Dec 2023, estimated pulmonary pressure is increased.  2. Septal flattening consistent with RV volume / pressure overload     Narrow LVOT (12 mm) with turbulent outflow Turbulent flow through LV, LVOT with near cavity obliteration No significant gradient at rest.. Left ventricular ejection fraction, by estimation, is >75%. The left ventricle has hyperdynamic function. The left ventricle has no regional wall motion abnormalities. There is mild concentric left ventricular hypertrophy. Left ventricular diastolic  parameters are consistent with Grade II diastolic dysfunction (pseudonormalization). Elevated left atrial pressure.  3. The right ventricular size is moderately enlarged. There is severely elevated pulmonary artery systolic pressure.  4. Left atrial size was moderately dilated.  5. Right atrial size was moderately dilated.  6. A small pericardial effusion is present.  7. Mitral valve is thickened, calcified. Mean gradient through the valve is 13 mmHG (HR 90 bpm) Overall consistent with severe MS COmpared to echo from Dec 2023, mean gradient is increased. Mild mitral valve regurgitation. Severe mitral annular calcification.  8. The tricuspid valve is degenerative. Tricuspid valve regurgitation is severe.  9. The aortic valve is tricuspid. Aortic valve regurgitation is not visualized. Aortic valve sclerosis is present, with no evidence of aortic valve stenosis. 10. Pulmonic valve regurgitation is moderate. FINDINGS  Left Ventricle: Septal flattening consistent with RV volume / pressure overload Narrow LVOT (12 mm) with turbulent outflow Turbulent flow through LV, LVOT with near cavity obliteration No significant gradient at rest. Left ventricular ejection fraction, by estimation, is >75%. The left ventricle has hyperdynamic function. The left ventricle has no regional wall motion abnormalities. The left ventricular internal cavity size was small. There is mild concentric left ventricular hypertrophy. Left ventricular diastolic parameters are consistent with Grade II diastolic dysfunction (pseudonormalization). Elevated left atrial pressure. Right Ventricle: The right ventricular size is moderately enlarged. Right vetricular wall thickness was not assessed. There is severely elevated pulmonary artery systolic pressure. The tricuspid regurgitant velocity is 5.59 m/s, and with an assumed right  atrial pressure of 15 mmHg, the estimated right ventricular systolic pressure is 140.0 mmHg. Left Atrium: Left atrial size was  moderately dilated. Right Atrium: Right atrial size was moderately dilated. Pericardium: A small pericardial effusion is present. Mitral Valve: Mitral valve is thickened, calcified. Mean gradient through the valve is 13 mmHG (HR 90 bpm) Overall consistent with severe MS COmpared to echo from Dec 2023, mean gradient is increased. There is moderate thickening of the mitral valve leaflet(s). There is severe calcification of the mitral valve leaflet(s). Severe mitral annular calcification. Mild mitral valve regurgitation. MV peak gradient, 19.0 mmHg. The mean mitral valve gradient is 14.0 mmHg. Tricuspid Valve: The tricuspid valve is degenerative in appearance. Tricuspid valve regurgitation is severe. Aortic Valve: The aortic valve is tricuspid. Aortic valve regurgitation is not visualized. Aortic valve sclerosis is present, with no evidence of aortic valve stenosis. Aortic valve mean gradient measures 9.0 mmHg. Aortic valve peak gradient measures 17.1 mmHg. Aortic valve area, by VTI measures 0.95 cm. Pulmonic Valve: The pulmonic valve was grossly normal. Pulmonic valve regurgitation is moderate. Aorta: The aortic root and ascending aorta are structurally normal, with no evidence of dilitation. IAS/Shunts: No atrial level shunt detected by color flow Doppler.  LEFT VENTRICLE PLAX 2D LVIDd:         2.60 cm     Diastology LVIDs:         1.40 cm     LV e' medial:    4.70 cm/s LV PW:         1.20 cm     LV E/e' medial:  47.0 LV IVS:        1.20 cm     LV e' lateral:   4.87 cm/s LVOT diam:     1.25 cm     LV E/e' lateral: 45.4 LV SV:         27 LV SV Index:   32 LVOT Area:     1.23 cm  LV Volumes (MOD) LV vol d, MOD A2C: 25.7 ml LV vol d, MOD A4C: 30.0 ml LV vol s, MOD A2C: 6.1 ml LV vol s, MOD A4C: 6.8 ml LV SV MOD A2C:     19.6 ml LV SV MOD A4C:     30.0 ml LV SV MOD BP:      24.6 ml RIGHT VENTRICLE             IVC RV Basal diam:  4.00 cm     IVC diam: 2.40 cm RV Mid diam:    3.20 cm RV S prime:     10.20 cm/s TAPSE  (M-mode): 1.2 cm LEFT ATRIUM             Index        RIGHT ATRIUM           Index LA diam:        4.20 cm 5.09 cm/m   RA Area:     15.60 cm LA Vol (A2C):   45.4 ml 55.01 ml/m  RA Volume:   40.80 ml  49.44 ml/m LA Vol (A4C):   30.0 ml 36.35 ml/m LA Biplane Vol: 37.8 ml 45.80 ml/m  AORTIC VALVE                     PULMONIC VALVE AV Area (Vmax):    0.97 cm      PV Vmax:          1.07 m/s AV Area (Vmean):  1.11 cm      PV Peak grad:     4.6 mmHg AV Area (VTI):     0.95 cm      PR End Diast Vel: 3.18 msec AV Vmax:           207.00 cm/s AV Vmean:          135.000 cm/s AV VTI:            0.282 m AV Peak Grad:      17.1 mmHg AV Mean Grad:      9.0 mmHg LVOT Vmax:         164.00 cm/s LVOT Vmean:        122.000 cm/s LVOT VTI:          0.218 m LVOT/AV VTI ratio: 0.77  AORTA Ao Root diam: 1.95 cm Ao Asc diam:  2.20 cm MITRAL VALVE                TRICUSPID VALVE MV Area (PHT): 4.60 cm     TR Peak grad:   125.0 mmHg MV Area VTI:   0.53 cm     TR Vmax:        559.00 cm/s MV Peak grad:  19.0 mmHg MV Mean grad:  14.0 mmHg    SHUNTS MV Vmax:       2.18 m/s     Systemic VTI:  0.22 m MV Vmean:      179.0 cm/s   Systemic Diam: 1.25 cm MV Decel Time: 165 msec MV E velocity: 221.00 cm/s MV A velocity: 209.00 cm/s MV E/A ratio:  1.06 Dietrich Pates MD Electronically signed by Dietrich Pates MD Signature Date/Time: 07/23/2023/6:26:47 PM    Final     EKG: Independently reviewed. See above  Assessment/Plan  Acute exacerbation of  HFpEF exacerbation/RV failure in setting of missed HD session  Acute hypoxic respiratory failure  -admit to progressive care  - Expedited HD for fluid management  -wean Bipap support as able  -Renal/ Cardiology consult   HCAP -patient with fever and infiltrate on imaging  - continue with vanc/zosyn  -repeat imaging s/p HD  -place on pneumonia protocol  - f/u on urine ag/ and blood cultures    Hyperkalemia -s/p treatment in ED as temporizing measure -expedited HD planned    ESRD on HD  MWF -recent missed session due to holiday  - transition back to MWF -await nephrology recs    Severe pulmonary hypertension/moderate mitral stenosis -recent echo noted  -await further cardiology recs    Normocytic anemia -stable h/h    HTN -BP stable -resume home regimen once med rec completed   GERD PPI   History of spina bifida with caudal regression Wheelchair-bound at baseline   Obesity  Estimated body mass index is 38.03 kg/m  Follow up with out patient   DVT prophylaxis: heparin Code Status: full/ as discussed per patient wishes in event of cardiac arrest  Family Communication:  Disposition Plan: patient  expected to be admitted greater than 2 midnights  Consults called: Nephrology Dr Allena Katz, Cardiology  Admission status: progressive care   Lurline Del MD [facility]   If 7PM-7AM, please contact night-coverage [email or web address] Password [email or web address]  07/24/2023, 12:53 AM

## 2023-07-24 NOTE — ED Notes (Signed)
Transported to dialysis.

## 2023-07-24 NOTE — Consult Note (Signed)
Reason for Consult: Hyperkalemia, acute hypoxic respiratory failure in patient with ESRD Referring Physician: Gunnar Bulla MD (EDP)  HPI:  32 year old woman with past medical history significant for spina bifida with caudal regression syndrome, hypertension, GERD, anxiety/depression, bronchial asthma, eczema, HFpEF with severe pulmonary hypertension, mitral stenosis and end-stage renal disease on hemodialysis on a MWF schedule.  Presented to the emergency room last night with a nonproductive cough and progressive worsening of shortness of breath through the course of yesterday after her last hemodialysis treatment on 07/21/2023 that she cannot show to 3 hours and 4 minutes (of her prescribed time of 3 hours and 30 minutes) and left 2.8 L over her estimated dry weight.  She was just recently discharged from the hospital last week following a similar presentation with adjustment of EDW.  She has problems tolerating UF goal >2.5 L.  CXR showed a small persistent loculated right pleural effusion with possible RML consolidation.  She was emergently dialyzed overnight and is seen resting comfortably off BiPAP in the dialysis unit (UF 1.6 L).  Dialysis prescription: [facility], MWF, 180 dialyzer, 3 hours 30 minutes, BFR 300/DFR 500, EDW 31 kg, 2.0 potassium/2.0 calcium, UF profile #2, no heparin, left BCF, Mircera 50 mcg every 4 weeks, calcitriol 1.25 mcg 3 times weekly  Past Medical History:  Diagnosis Date   Acute asthma exacerbation 07/15/2023   Anemia associated with chronic renal failure    Blood transfusion    Caudal regression syndrome    Assoc with spina bifida.   Depression 05/14/2015   Dialysis care    ESRD (end stage renal disease) on dialysis [facility])    GERD (gastroesophageal reflux disease) 01/07/2017   HTN (hypertension) 05/02/2011   Murmur, cardiac 07/17/2022   Spina bifida    UTI (lower urinary tract infection)    Viral URI with cough 09/15/2022    Past Surgical  History:  Procedure Laterality Date   A/V FISTULAGRAM Left 10/14/2021   Procedure: A/V Fistulagram;  Surgeon: Maeola Harman, MD;  Location: [facility] INVASIVE CV LAB;  Service: Cardiovascular;  Laterality: Left;   AV FISTULA PLACEMENT     left arm   INSERTION OF DIALYSIS CATHETER N/A 05/08/2020   Procedure: ATTEMPTED, UNSUCCESSFUL INSERTION OF DIALYSIS CATHETER TUNNELED RIGHT INTERNAL JUGULAR;  Surgeon: Lucretia Roers, MD;  Location: [facility] ORS;  Service: General;  Laterality: N/A;   IR FLUORO GUIDE CV LINE RIGHT  05/09/2020   IR US GUIDE VASC ACCESS RIGHT  05/09/2020   RIGHT HEART CATH N/A 06/30/2023   Procedure: RIGHT HEART CATH;  Surgeon: Laurey Morale, MD;  Location: [facility] INVASIVE CV LAB;  Service: Cardiovascular;  Laterality: N/A;    Family History  Problem Relation Age of Onset   Kidney cancer Other    Hypertension Maternal Grandmother    Arthritis Maternal Grandmother    Breast cancer Maternal Aunt    Colon cancer Neg Hx     Social History:  reports that she has been smoking cigarettes. She has a 2.8 pack-year smoking history. She has been exposed to tobacco smoke. She has never used smokeless tobacco. She reports current drug use. Drug: Marijuana. She reports that she does not drink alcohol.  Allergies:  Allergies  Allergen Reactions   Ciprofloxacin Shortness Of Breath, Nausea And Vomiting and Other (See Comments)    HIGH FEVER and oral blisters    Other Anaphylaxis    Revaclear dialzer   Peanut-Containing Drug Products Anaphylaxis   Aleve [Naproxen Sodium] Other (See Comments)  G.I.Bleed   Ceftriaxone Other (See Comments)    Blisters in mouth    Coconut (Cocos Nucifera) Hives   Influenza Vaccines Nausea And Vomiting and Other (See Comments)    High fever   Tetanus Toxoid, Adsorbed Nausea And Vomiting and Other (See Comments)    HIGH FEVER, also   Tetanus Toxoids Nausea And Vomiting and Other (See Comments)    HIGH FEVER   Latex Itching and Rash     Medications: I have reviewed the patient's current medications. Scheduled:  Chlorhexidine Gluconate Cloth  6 each Topical Q0600   vancomycin variable dose per unstable renal function (pharmacist dosing)   Does not apply See admin instructions    BMET    Component Value Date/Time   NA 139 07/23/2023 2000   K 5.7 (H) 07/23/2023 2000   CL 97 (L) 07/23/2023 2000   CO2 21 (L) 07/23/2023 2000   GLUCOSE 80 07/23/2023 2357   BUN 56 (H) 07/23/2023 2000   CREATININE 7.34 (H) 07/23/2023 2000   CALCIUM 9.5 07/23/2023 2000   GFRNONAA 7 (L) 07/23/2023 2000      Latest Ref Rng & Units 07/23/2023    8:00 PM 07/16/2023    4:39 AM 07/15/2023    4:05 AM  CBC  WBC 4.0 - 10.5 K/uL 7.6  10.5  8.7   Hemoglobin 12.0 - 15.0 g/dL 16.1  09.6  9.8   Hematocrit 36.0 - 46.0 % 35.4  36.5  33.5   Platelets 150 - 400 K/uL 128  136  114     DG Chest Portable 1 View Result Date: 07/23/2023 CLINICAL DATA:  fluid EXAM: PORTABLE CHEST 1 VIEW COMPARISON:  Chest x-ray 07/15/2023, cxr 05/30/23, ct abd/pelvis 05/30/23 FINDINGS: Persistent enlarged cardiac silhouette. The heart and mediastinal contours are unchanged. Low lung volumes. Question right middle lobe consolidation. No pulmonary edema. Persistent loculated right pleural effusion-at least small volume. No pneumothorax. No acute osseous abnormality. IMPRESSION: 1. Low lung volumes. 2. Persistent enlarged cardiac silhouette with underlying pericardial effusion not excluded. 3. Persistent loculated right pleural effusion-at least small volume. 4. Question right middle lobe consolidation. Electronically Signed   By: Tish Frederickson M.D.   On: 07/23/2023 20:17   ECHOCARDIOGRAM COMPLETE Result Date: 07/23/2023    ECHOCARDIOGRAM REPORT   Patient Name:   WILLA SADDLER Date of Exam: 07/23/2023 Medical Rec #:  [id number]        Height:       36.0 in Accession #:    [id number]       Weight:       70.1 lb Date of Birth:  06-11-1991        BSA:          0.825 m Patient  Age:    32 years         BP:           116/85 mmHg Patient Gender: F                HR:           92 bpm. Exam Location:  Outpatient Procedure: 2D Echo, Color Doppler and Cardiac Doppler Indications:    I50.9 Congestive Heart Failure  History:        Patient has prior history of Echocardiogram examinations. CHF,                 ESRD, Mitral Stenosis; Risk Factors:Hypertension.  Sonographer:    L. Thornton-Maynard, RDCS Referring Phys: [id number] [facility]  Sonographer Comments: Patient very short of breath during exam. Patient went to ED following the test IMPRESSIONS  1. COmpared to echo from Dec 2023, estimated pulmonary pressure is increased.  2. Septal flattening consistent with RV volume / pressure overload     Narrow LVOT (12 mm) with turbulent outflow Turbulent flow through LV, LVOT with near cavity obliteration No significant gradient at rest.. Left ventricular ejection fraction, by estimation, is >75%. The left ventricle has hyperdynamic function. The left ventricle has no regional wall motion abnormalities. There is mild concentric left ventricular hypertrophy. Left ventricular diastolic parameters are consistent with Grade II diastolic dysfunction (pseudonormalization). Elevated left atrial pressure.  3. The right ventricular size is moderately enlarged. There is severely elevated pulmonary artery systolic pressure.  4. Left atrial size was moderately dilated.  5. Right atrial size was moderately dilated.  6. A small pericardial effusion is present.  7. Mitral valve is thickened, calcified. Mean gradient through the valve is 13 mmHG (HR 90 bpm) Overall consistent with severe MS COmpared to echo from Dec 2023, mean gradient is increased. Mild mitral valve regurgitation. Severe mitral annular calcification.  8. The tricuspid valve is degenerative. Tricuspid valve regurgitation is severe.  9. The aortic valve is tricuspid. Aortic valve regurgitation is not visualized. Aortic valve sclerosis is present, with  no evidence of aortic valve stenosis. 10. Pulmonic valve regurgitation is moderate. FINDINGS  Left Ventricle: Septal flattening consistent with RV volume / pressure overload Narrow LVOT (12 mm) with turbulent outflow Turbulent flow through LV, LVOT with near cavity obliteration No significant gradient at rest. Left ventricular ejection fraction, by estimation, is >75%. The left ventricle has hyperdynamic function. The left ventricle has no regional wall motion abnormalities. The left ventricular internal cavity size was small. There is mild concentric left ventricular hypertrophy. Left ventricular diastolic parameters are consistent with Grade II diastolic dysfunction (pseudonormalization). Elevated left atrial pressure. Right Ventricle: The right ventricular size is moderately enlarged. Right vetricular wall thickness was not assessed. There is severely elevated pulmonary artery systolic pressure. The tricuspid regurgitant velocity is 5.59 m/s, and with an assumed right  atrial pressure of 15 mmHg, the estimated right ventricular systolic pressure is 140.0 mmHg. Left Atrium: Left atrial size was moderately dilated. Right Atrium: Right atrial size was moderately dilated. Pericardium: A small pericardial effusion is present. Mitral Valve: Mitral valve is thickened, calcified. Mean gradient through the valve is 13 mmHG (HR 90 bpm) Overall consistent with severe MS COmpared to echo from Dec 2023, mean gradient is increased. There is moderate thickening of the mitral valve leaflet(s). There is severe calcification of the mitral valve leaflet(s). Severe mitral annular calcification. Mild mitral valve regurgitation. MV peak gradient, 19.0 mmHg. The mean mitral valve gradient is 14.0 mmHg. Tricuspid Valve: The tricuspid valve is degenerative in appearance. Tricuspid valve regurgitation is severe. Aortic Valve: The aortic valve is tricuspid. Aortic valve regurgitation is not visualized. Aortic valve sclerosis is present,  with no evidence of aortic valve stenosis. Aortic valve mean gradient measures 9.0 mmHg. Aortic valve peak gradient measures 17.1 mmHg. Aortic valve area, by VTI measures 0.95 cm. Pulmonic Valve: The pulmonic valve was grossly normal. Pulmonic valve regurgitation is moderate. Aorta: The aortic root and ascending aorta are structurally normal, with no evidence of dilitation. IAS/Shunts: No atrial level shunt detected by color flow Doppler.  LEFT VENTRICLE PLAX 2D LVIDd:         2.60 cm     Diastology LVIDs:  1.40 cm     LV e' medial:    4.70 cm/s LV PW:         1.20 cm     LV E/e' medial:  47.0 LV IVS:        1.20 cm     LV e' lateral:   4.87 cm/s LVOT diam:     1.25 cm     LV E/e' lateral: 45.4 LV SV:         27 LV SV Index:   32 LVOT Area:     1.23 cm  LV Volumes (MOD) LV vol d, MOD A2C: 25.7 ml LV vol d, MOD A4C: 30.0 ml LV vol s, MOD A2C: 6.1 ml LV vol s, MOD A4C: 6.8 ml LV SV MOD A2C:     19.6 ml LV SV MOD A4C:     30.0 ml LV SV MOD BP:      24.6 ml RIGHT VENTRICLE             IVC RV Basal diam:  4.00 cm     IVC diam: 2.40 cm RV Mid diam:    3.20 cm RV S prime:     10.20 cm/s TAPSE (M-mode): 1.2 cm LEFT ATRIUM             Index        RIGHT ATRIUM           Index LA diam:        4.20 cm 5.09 cm/m   RA Area:     15.60 cm LA Vol (A2C):   45.4 ml 55.01 ml/m  RA Volume:   40.80 ml  49.44 ml/m LA Vol (A4C):   30.0 ml 36.35 ml/m LA Biplane Vol: 37.8 ml 45.80 ml/m  AORTIC VALVE                     PULMONIC VALVE AV Area (Vmax):    0.97 cm      PV Vmax:          1.07 m/s AV Area (Vmean):   1.11 cm      PV Peak grad:     4.6 mmHg AV Area (VTI):     0.95 cm      PR End Diast Vel: 3.18 msec AV Vmax:           207.00 cm/s AV Vmean:          135.000 cm/s AV VTI:            0.282 m AV Peak Grad:      17.1 mmHg AV Mean Grad:      9.0 mmHg LVOT Vmax:         164.00 cm/s LVOT Vmean:        122.000 cm/s LVOT VTI:          0.218 m LVOT/AV VTI ratio: 0.77  AORTA Ao Root diam: 1.95 cm Ao Asc diam:  2.20 cm MITRAL  VALVE                TRICUSPID VALVE MV Area (PHT): 4.60 cm     TR Peak grad:   125.0 mmHg MV Area VTI:   0.53 cm     TR Vmax:        559.00 cm/s MV Peak grad:  19.0 mmHg MV Mean grad:  14.0 mmHg    SHUNTS MV Vmax:       2.18 m/s     Systemic  VTI:  0.22 m MV Vmean:      179.0 cm/s   Systemic Diam: 1.25 cm MV Decel Time: 165 msec MV E velocity: 221.00 cm/s MV A velocity: 209.00 cm/s MV E/A ratio:  1.06 Dietrich Pates MD Electronically signed by Dietrich Pates MD Signature Date/Time: 07/23/2023/6:26:47 PM    Final     Review of Systems  Constitutional:  Positive for fatigue. Negative for appetite change, chills and fever.  HENT:  Negative for facial swelling, sinus pressure and sore throat.   Eyes:  Negative for redness and visual disturbance.  Respiratory:  Positive for cough and shortness of breath. Negative for chest tightness.   Cardiovascular:  Negative for chest pain and leg swelling.  Gastrointestinal:  Negative for abdominal pain, diarrhea, nausea and vomiting.  Musculoskeletal:  Negative for myalgias and neck stiffness.  Skin:  Negative for wound.  Neurological:  Positive for weakness. Negative for dizziness and numbness.   Blood pressure 122/82, pulse 91, temperature 98.8 F (37.1 C), temperature source Oral, resp. rate (!) 31, SpO2 96%. Physical Exam Vitals reviewed.  Constitutional:      General: She is not in acute distress.    Appearance: Normal appearance.     Comments: Somnolent when seen in dialysis  HENT:     Head: Normocephalic and atraumatic.     Right Ear: External ear normal.     Left Ear: External ear normal.  Eyes:     Conjunctiva/sclera: Conjunctivae normal.  Cardiovascular:     Rate and Rhythm: Normal rate and regular rhythm.     Heart sounds: Murmur heard.     Comments: 3/6 holosystolic outflow tract murmur Pulmonary:     Breath sounds: No wheezing or rales.     Comments: Poor inspiratory effort with decreased breath sounds over bases Abdominal:     General:  There is distension.     Palpations: Abdomen is soft.     Tenderness: There is no abdominal tenderness.  Musculoskeletal:     Cervical back: Normal range of motion and neck supple.     Comments: Contractured lower extremities.  Intact dressings over left BCF  Skin:    General: Skin is warm and dry.     Assessment/Plan: 1.  Acute hypoxic respiratory failure: Appears to be from a combination of volume overload and possible community-acquired pneumonia; started on vancomycin/Zosyn per hospitalist service.  She is status post hemodialysis with 1.6 L UF (could not tolerate higher goal due to cramping) and is off supplemental oxygen when seen.  2.  End-stage renal disease: Underwent hemodialysis on 12/24 leaving over her dry weight.  Emergent hemodialysis undertaken overnight. 3.  Hyperkalemia: Managed definitively with dialysis overnight.  Will obtain follow-up labs. 4.  Hypertension: Blood pressure at goal/controlled with dialysis overnight.  Dagoberto Ligas 07/24/2023, 5:29 AM

## 2023-07-25 DIAGNOSIS — N186 End stage renal disease: Secondary | ICD-10-CM | POA: Diagnosis not present

## 2023-07-25 DIAGNOSIS — J189 Pneumonia, unspecified organism: Secondary | ICD-10-CM | POA: Diagnosis not present

## 2023-07-25 DIAGNOSIS — Z992 Dependence on renal dialysis: Secondary | ICD-10-CM | POA: Diagnosis not present

## 2023-07-25 DIAGNOSIS — N179 Acute kidney failure, unspecified: Secondary | ICD-10-CM | POA: Diagnosis not present

## 2023-07-25 LAB — IRON AND TIBC
Iron: 90 ug/dL (ref 28–170)
Saturation Ratios: 28 % (ref 10.4–31.8)
TIBC: 316 ug/dL (ref 250–450)
UIBC: 226 ug/dL

## 2023-07-25 LAB — RETICULOCYTES
Immature Retic Fract: 24.4 % — ABNORMAL HIGH (ref 2.3–15.9)
RBC.: 3.46 MIL/uL — ABNORMAL LOW (ref 3.87–5.11)
Retic Count, Absolute: 92.7 10*3/uL (ref 19.0–186.0)
Retic Ct Pct: 2.7 % (ref 0.4–3.1)

## 2023-07-25 LAB — FERRITIN: Ferritin: 1491 ng/mL — ABNORMAL HIGH (ref 11–307)

## 2023-07-25 LAB — FOLATE: Folate: 21.8 ng/mL (ref >5.9)

## 2023-07-25 LAB — VITAMIN B12: Vitamin B-12: 776 pg/mL (ref 180–914)

## 2023-07-25 MED ORDER — BENZONATATE 100 MG PO CAPS
100.0000 mg | ORAL_CAPSULE | Freq: Three times a day (TID) | ORAL | Status: DC | PRN
  Administered 2023-07-25: 100 mg via ORAL
  Filled 2023-07-25: qty 1

## 2023-07-25 MED ORDER — GUAIFENESIN-DM 100-10 MG/5ML PO SYRP
5.0000 mL | ORAL_SOLUTION | ORAL | Status: DC | PRN
  Administered 2023-07-25: 5 mL via ORAL
  Filled 2023-07-25: qty 5

## 2023-07-25 NOTE — Progress Notes (Signed)
Subjective: Woken from sleep no shortness of breath on room air.  O2 sat 100% tolerated dialysis yesterday on schedule.  Husband in room asking about possible discharge  Objective Vital signs in last 24 hours: Vitals:   07/24/23 2148 07/24/23 2238 07/25/23 0006 07/25/23 0615  BP: 122/80  129/88 (!) 134/94  Pulse: 88 86    Resp: (!) 21 (!) 22    Temp: 98.2 F (36.8 C)  99.5 F (37.5 C) 97.8 F (36.6 C)  TempSrc: Oral  Oral Oral  SpO2: 90% 90%    Weight: 34.2 kg   35.4 kg   Weight change:   Physical Exam: General: Alert young female small stature with spina bifida, NAD Heart: RRR, 2/6 systolic outright murmur, no rub or gallop Lungs: Faint right-sided wheeze, otherwise CTA nonlabored breathing room air, last O2 sat 100% room air Abdomen: NABS soft NT ND Extremities: Contracted lower extremities no edema appreciated Dialysis Access: LUA AV fistula positive bruit with aneurysmal development   OP dialysis prescription: [facility], MWF,(except holiday schedule Sunday Tuesday Friday starting 12/29) 180 dialyzer, 3 hours 30 minutes, BFR 300/DFR 500, EDW 31 kg, 2.0 potassium/2.0 calcium, UF profile #2, no heparin, left BCF, Mircera 50 mcg every 4 weeks, calcitriol 1.25 mcg 3 times weekly   Problem/Plan: Acute hypoxic respiratory failure= probably etiology of commendation community-acquired pneumonia and volume overload currently asymptomatic ESRD -HD MWF, currently on schedule, no HD needs today, outpatient holiday schedule dialysis tomorrow /if discharged I will notify kidney center unfortunately no dialysis tomorrow unless emergency In Hospital, HTN/volume -BP at goal controlled with HD volume as noted above no change in dry weight for now, no home BP meds Hyperkalemia= K5.7 resolved with HD Anemia -Hgb 10.7, 9.8 continue ESA as outpatient Secondary hyperparathyroidism -corrected calcium at goal, continue Auryxia as phosphate binder  Jeanne Pastel, PA-C [facility] Beeper [phone] 07/25/2023,8:42 AM  LOS: 1 day   Labs: Basic Metabolic Panel: Recent Labs  Lab 07/23/23 2000 07/23/23 2357 07/24/23 0900  NA 139  --  135  K 5.7*  --  4.6  CL 97*  --  95*  CO2 21*  --  24  GLUCOSE 73 80 88  BUN 56*  --  20  CREATININE 7.34*  --  3.92*  CALCIUM 9.5  --  8.9   Liver Function Tests: Recent Labs  Lab 07/23/23 2000 07/24/23 0900  AST 28 38  ALT 18 20  ALKPHOS 135* 124  BILITOT 1.1 1.5*  PROT 7.9 7.6  ALBUMIN 3.7 3.3*   No results for input(s): "LIPASE", "AMYLASE" in the last 168 hours. No results for input(s): "AMMONIA" in the last 168 hours. CBC: Recent Labs  Lab 07/23/23 2000 07/24/23 0800  WBC 7.6 6.4  NEUTROABS 5.8  --   HGB 10.7* 9.8*  HCT 35.4* 32.8*  MCV 105.0* 106.1*  PLT 128* 71*   Cardiac Enzymes: No results for input(s): "CKTOTAL", "CKMB", "CKMBINDEX", "TROPONINI" in the last 168 hours. CBG: Recent Labs  Lab 07/23/23 2243 07/24/23 0913  GLUCAP 100* 71    Studies/Results: DG Chest Portable 1 View Result Date: 07/23/2023 CLINICAL DATA:  fluid EXAM: PORTABLE CHEST 1 VIEW COMPARISON:  Chest x-ray 07/15/2023, cxr 05/30/23, ct abd/pelvis 05/30/23 FINDINGS: Persistent enlarged cardiac silhouette. The heart and mediastinal contours are unchanged. Low lung volumes. Question right middle lobe consolidation. No pulmonary edema. Persistent loculated right pleural effusion-at least small volume. No pneumothorax. No acute osseous abnormality. IMPRESSION: 1. Low lung volumes. 2. Persistent enlarged  cardiac silhouette with underlying pericardial effusion not excluded. 3. Persistent loculated right pleural effusion-at least small volume. 4. Question right middle lobe consolidation. Electronically Signed   By: [name] M.D.   On: 07/23/2023 20:17   ECHOCARDIOGRAM COMPLETE Result Date: 07/23/2023    ECHOCARDIOGRAM REPORT   Patient Name:   [name] Date of Exam: 07/23/2023 Medical Rec #:  [id number]         Height:       36.0 in Accession #:    [id number]       Weight:       70.1 lb Date of Birth:  Dec 31, 1990        BSA:          0.825 m Patient Age:    32 years         BP:           116/85 mmHg Patient Gender: F                HR:           92 bpm. Exam Location:  Outpatient Procedure: 2D Echo, Color Doppler and Cardiac Doppler Indications:    I50.9 Congestive Heart Failure  History:        Patient has prior history of Echocardiogram examinations. CHF,                 ESRD, Mitral Stenosis; Risk Factors:Hypertension.  Sonographer:    L. Thornton-Maynard, RDCS Referring Phys: [id number] [facility]  Sonographer Comments: Patient very short of breath during exam. Patient went to ED following the test IMPRESSIONS  1. COmpared to echo from Dec 2023, estimated pulmonary pressure is increased.  2. Septal flattening consistent with RV volume / pressure overload     Narrow LVOT (12 mm) with turbulent outflow Turbulent flow through LV, LVOT with near cavity obliteration No significant gradient at rest.. Left ventricular ejection fraction, by estimation, is >75%. The left ventricle has hyperdynamic function. The left ventricle has no regional wall motion abnormalities. There is mild concentric left ventricular hypertrophy. Left ventricular diastolic parameters are consistent with Grade II diastolic dysfunction (pseudonormalization). Elevated left atrial pressure.  3. The right ventricular size is moderately enlarged. There is severely elevated pulmonary artery systolic pressure.  4. Left atrial size was moderately dilated.  5. Right atrial size was moderately dilated.  6. A small pericardial effusion is present.  7. Mitral valve is thickened, calcified. Mean gradient through the valve is 13 mmHG (HR 90 bpm) Overall consistent with severe MS COmpared to echo from Dec 2023, mean gradient is increased. Mild mitral valve regurgitation. Severe mitral annular calcification.  8. The tricuspid valve is degenerative. Tricuspid valve  regurgitation is severe.  9. The aortic valve is tricuspid. Aortic valve regurgitation is not visualized. Aortic valve sclerosis is present, with no evidence of aortic valve stenosis. 10. Pulmonic valve regurgitation is moderate. FINDINGS  Left Ventricle: Septal flattening consistent with RV volume / pressure overload Narrow LVOT (12 mm) with turbulent outflow Turbulent flow through LV, LVOT with near cavity obliteration No significant gradient at rest. Left ventricular ejection fraction, by estimation, is >75%. The left ventricle has hyperdynamic function. The left ventricle has no regional wall motion abnormalities. The left ventricular internal cavity size was small. There is mild concentric left ventricular hypertrophy. Left ventricular diastolic parameters are consistent with Grade II diastolic dysfunction (pseudonormalization). Elevated left atrial pressure. Right Ventricle: The right ventricular size is moderately enlarged. Right vetricular wall thickness  was not assessed. There is severely elevated pulmonary artery systolic pressure. The tricuspid regurgitant velocity is 5.59 m/s, and with an assumed right  atrial pressure of 15 mmHg, the estimated right ventricular systolic pressure is 140.0 mmHg. Left Atrium: Left atrial size was moderately dilated. Right Atrium: Right atrial size was moderately dilated. Pericardium: A small pericardial effusion is present. Mitral Valve: Mitral valve is thickened, calcified. Mean gradient through the valve is 13 mmHG (HR 90 bpm) Overall consistent with severe MS COmpared to echo from Dec 2023, mean gradient is increased. There is moderate thickening of the mitral valve leaflet(s). There is severe calcification of the mitral valve leaflet(s). Severe mitral annular calcification. Mild mitral valve regurgitation. MV peak gradient, 19.0 mmHg. The mean mitral valve gradient is 14.0 mmHg. Tricuspid Valve: The tricuspid valve is degenerative in appearance. Tricuspid valve  regurgitation is severe. Aortic Valve: The aortic valve is tricuspid. Aortic valve regurgitation is not visualized. Aortic valve sclerosis is present, with no evidence of aortic valve stenosis. Aortic valve mean gradient measures 9.0 mmHg. Aortic valve peak gradient measures 17.1 mmHg. Aortic valve area, by VTI measures 0.95 cm. Pulmonic Valve: The pulmonic valve was grossly normal. Pulmonic valve regurgitation is moderate. Aorta: The aortic root and ascending aorta are structurally normal, with no evidence of dilitation. IAS/Shunts: No atrial level shunt detected by color flow Doppler.  LEFT VENTRICLE PLAX 2D LVIDd:         2.60 cm     Diastology LVIDs:         1.40 cm     LV e' medial:    4.70 cm/s LV PW:         1.20 cm     LV E/e' medial:  47.0 LV IVS:        1.20 cm     LV e' lateral:   4.87 cm/s LVOT diam:     1.25 cm     LV E/e' lateral: 45.4 LV SV:         27 LV SV Index:   32 LVOT Area:     1.23 cm  LV Volumes (MOD) LV vol d, MOD A2C: 25.7 ml LV vol d, MOD A4C: 30.0 ml LV vol s, MOD A2C: 6.1 ml LV vol s, MOD A4C: 6.8 ml LV SV MOD A2C:     19.6 ml LV SV MOD A4C:     30.0 ml LV SV MOD BP:      24.6 ml RIGHT VENTRICLE             IVC RV Basal diam:  4.00 cm     IVC diam: 2.40 cm RV Mid diam:    3.20 cm RV S prime:     10.20 cm/s TAPSE (M-mode): 1.2 cm LEFT ATRIUM             Index        RIGHT ATRIUM           Index LA diam:        4.20 cm 5.09 cm/m   RA Area:     15.60 cm LA Vol (A2C):   45.4 ml 55.01 ml/m  RA Volume:   40.80 ml  49.44 ml/m LA Vol (A4C):   30.0 ml 36.35 ml/m LA Biplane Vol: 37.8 ml 45.80 ml/m  AORTIC VALVE                     PULMONIC VALVE AV Area (Vmax):    0.97 cm  PV Vmax:          1.07 m/s AV Area (Vmean):   1.11 cm      PV Peak grad:     4.6 mmHg AV Area (VTI):     0.95 cm      PR End Diast Vel: 3.18 msec AV Vmax:           207.00 cm/s AV Vmean:          135.000 cm/s AV VTI:            0.282 m AV Peak Grad:      17.1 mmHg AV Mean Grad:      9.0 mmHg LVOT Vmax:          164.00 cm/s LVOT Vmean:        122.000 cm/s LVOT VTI:          0.218 m LVOT/AV VTI ratio: 0.77  AORTA Ao Root diam: 1.95 cm Ao Asc diam:  2.20 cm MITRAL VALVE                TRICUSPID VALVE MV Area (PHT): 4.60 cm     TR Peak grad:   125.0 mmHg MV Area VTI:   0.53 cm     TR Vmax:        559.00 cm/s MV Peak grad:  19.0 mmHg MV Mean grad:  14.0 mmHg    SHUNTS MV Vmax:       2.18 m/s     Systemic VTI:  0.22 m MV Vmean:      179.0 cm/s   Systemic Diam: 1.25 cm MV Decel Time: 165 msec MV E velocity: 221.00 cm/s MV A velocity: 209.00 cm/s MV E/A ratio:  1.06 Dietrich Pates MD Electronically signed by Dietrich Pates MD Signature Date/Time: 07/23/2023/6:26:47 PM    Final    Medications:  piperacillin-tazobactam (ZOSYN)  IV 2.25 g (07/25/23 0525)    Chlorhexidine Gluconate Cloth  6 each Topical Q0600   heparin injection (subcutaneous)  5,000 Units Subcutaneous Q12H   hydrALAZINE  25 mg Oral Daily   metoprolol succinate  100 mg Oral Daily   pantoprazole  40 mg Oral Daily

## 2023-07-25 NOTE — Progress Notes (Signed)
[facility]                                                                               [name], is a 32 y.o. female, DOB - [birth year], ZOX:[id number] Admit date - 07/23/2023    Outpatient Primary MD for the patient is Dimas Aguas, Binnie Rail, FNP  LOS - 1  days    Brief summary    [name] is a 32 y.o. 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  She was admitted for acute respiratory failure with hypoxia secondary to pneumonia and fluid overload.   Assessment & Plan    Assessment and Plan:    Acute respiratory failure with hypoxia secondary to pneumonia and fluid overload.  Febrile, tachycardic, tachypnea on  admission.  Started on IV antibiotics and HD session on admission.  Nephrology on board. Recommend to continue antibiotics for another 48 hours.  She reports coughing and sob.   ESRD on HD MWF.  Nephrology on board.    Spinal Bifida with caudal regression syndrome Wheel chair bound.    Chronic diastolic CHF Fluid management with HD.    Anemia  Macrocytic. Will check folate and vit b12 levels.    Thrombocytopenia Drop in platelets from 128,000 to 71,000 Will d/c heparin and monitor.  No obvious signs of bleeding.  Recheck labs in am.      Estimated body mass index is 42.34 kg/m as calculated from the following:   Height as of 07/15/23: 3' (0.914 m).   Weight as of this encounter: 35.4 kg.  Code Status: full code.  DVT Prophylaxis:     Level of Care: Level of care: Progressive Family Communication: none at bedside.   Disposition Plan:     Remains inpatient appropriate:  dyspnea and hypoxia.   Procedures:  HD overnight.   Consultants:   Nephrology.   Antimicrobials:   Anti-infectives (From admission,  onward)    Start     Dose/Rate Route Frequency Ordered Stop   07/24/23 0715  piperacillin-tazobactam (ZOSYN) IVPB 2.25 g        2.25 g 100 mL/hr over 30 Minutes Intravenous Every 8 hours 07/24/23 0712     07/24/23 0328  vancomycin variable dose per unstable renal function (pharmacist dosing)  Status:  Discontinued         Does not apply See admin instructions 07/24/23 0329 07/24/23 1047   07/24/23 0300  vancomycin (VANCOREADY) IVPB 750 mg/150 mL        750 mg 150 mL/hr over 60 Minutes Intravenous  Once 07/24/23 0204 07/24/23 0737   07/24/23 0000  vancomycin (VANCOCIN) 750 mg in sodium chloride 0.9 % 250 mL IVPB  Status:  Discontinued        750 mg 265 mL/hr over 60 Minutes Intravenous  Once 07/23/23 2357 07/24/23 0203   07/23/23 2300  piperacillin-tazobactam (ZOSYN) IVPB 2.25 g        2.25 g 100 mL/hr over 30 Minutes Intravenous  Once 07/23/23 2252 07/24/23 0737   07/23/23 2300  vancomycin (VANCOREADY) IVPB 750 mg/150 mL  Status:  Discontinued        750 mg 150 mL/hr over 60 Minutes Intravenous  Once 07/23/23 2252 07/23/23 2357        Medications  Scheduled Meds:  hydrALAZINE  25 mg Oral Daily   metoprolol succinate  100 mg Oral Daily   pantoprazole  40 mg Oral Daily   Continuous Infusions:  piperacillin-tazobactam (ZOSYN)  IV 2.25 g (07/25/23 1425)   PRN Meds:.acetaminophen **OR** acetaminophen, albuterol, benzonatate, guaiFENesin-dextromethorphan, ondansetron **OR** ondansetron (ZOFRAN) IV    Subjective:   [name] was seen and examined today.    Objective:   Vitals:   07/25/23 0849 07/25/23 1107 07/25/23 1110 07/25/23 1350  BP: 122/84  (!) 139/97 (!) 130/99  Pulse:  79 74   Resp: 19  20   Temp: 98.1 F (36.7 C)   98.3 F (36.8 C)  TempSrc: Oral   Oral  SpO2: 100%  100%   Weight:        Intake/Output Summary (Last 24 hours) at 07/25/2023 1507 Last data filed at 07/25/2023 0119 Gross per 24 hour  Intake 222 ml  Output --  Net 222 ml   Filed  Weights   07/24/23 2148 07/25/23 0615  Weight: 34.2 kg 35.4 kg     Exam General exam: Appears calm and comfortable  Respiratory system: Clear to auscultation. Respiratory effort normal. Cardiovascular system: S1 & S2 heard, RRR.  Gastrointestinal system: Abdomen is nondistended, soft and nontender. Central nervous system: Alert and oriented.  Extremities: underdeveloped lower extremities.  Skin: No rashes, Psychiatry: Mood & affect appropriate.     Data Reviewed:  I have personally reviewed following labs and imaging studies   CBC Lab Results  Component Value Date   WBC 6.4 07/24/2023   RBC 3.09 (L) 07/24/2023   HGB 9.8 (L) 07/24/2023   HCT 32.8 (L) 07/24/2023   MCV 106.1 (H) 07/24/2023   MCH 31.7 07/24/2023   PLT 71 (L) 07/24/2023   MCHC 29.9 (L) 07/24/2023   RDW 17.9 (H) 07/24/2023   LYMPHSABS 0.6 (L) 07/23/2023   MONOABS 1.1 (H) 07/23/2023   EOSABS 0.1 07/23/2023   BASOSABS 0.0 07/23/2023     Last metabolic panel Lab Results  Component Value Date   NA 135 07/24/2023   K 4.6 07/24/2023   CL 95 (L) 07/24/2023   CO2 24 07/24/2023   BUN 20 07/24/2023   CREATININE 3.92 (H) 07/24/2023   GLUCOSE 88 07/24/2023   GFRNONAA 15 (L) 07/24/2023   GFRAA 9 (L) 04/30/2020   CALCIUM 8.9 07/24/2023   PHOS 4.6 07/15/2023   PROT 7.6 07/24/2023   ALBUMIN 3.3 (L) 07/24/2023   BILITOT 1.5 (H) 07/24/2023   ALKPHOS 124 07/24/2023   AST 38 07/24/2023   ALT 20 07/24/2023   ANIONGAP 16 (H) 07/24/2023    CBG (last 3)  Recent Labs    07/23/23 2243 07/24/23 0913  GLUCAP 100* 71      Coagulation Profile: No results for input(s): "INR", "PROTIME" in the last 168 hours.   Radiology Studies: DG Chest Portable 1 View Result Date: 07/23/2023 CLINICAL DATA:  fluid EXAM: PORTABLE CHEST 1 VIEW COMPARISON:  Chest x-ray 07/15/2023, cxr 05/30/23, ct abd/pelvis 05/30/23 FINDINGS: Persistent enlarged cardiac silhouette. The heart and mediastinal contours are unchanged. Low lung  volumes. Question right middle lobe consolidation. No pulmonary edema. Persistent loculated right pleural effusion-at least small volume. No pneumothorax. No acute osseous abnormality. IMPRESSION: 1. Low lung volumes. 2.  Persistent enlarged cardiac silhouette with underlying pericardial effusion not excluded. 3. Persistent loculated right pleural effusion-at least small volume. 4. Question right middle lobe consolidation. Electronically Signed   By: Tish Frederickson M.D.   On: 07/23/2023 20:17       Kathlen Mody M.D. [facility] 07/25/2023, 3:07 PM  Available via Epic secure chat 7am-7pm After 7 pm, please refer to night coverage provider listed on amion.

## 2023-07-26 ENCOUNTER — Inpatient Hospital Stay (HOSPITAL_COMMUNITY): Payer: Medicare Other

## 2023-07-26 DIAGNOSIS — N186 End stage renal disease: Secondary | ICD-10-CM | POA: Diagnosis not present

## 2023-07-26 DIAGNOSIS — M7989 Other specified soft tissue disorders: Secondary | ICD-10-CM

## 2023-07-26 DIAGNOSIS — N179 Acute kidney failure, unspecified: Secondary | ICD-10-CM | POA: Diagnosis not present

## 2023-07-26 DIAGNOSIS — J189 Pneumonia, unspecified organism: Secondary | ICD-10-CM | POA: Diagnosis not present

## 2023-07-26 DIAGNOSIS — Z992 Dependence on renal dialysis: Secondary | ICD-10-CM | POA: Diagnosis not present

## 2023-07-26 LAB — CBC WITH DIFFERENTIAL/PLATELET
Abs Immature Granulocytes: 0.03 10*3/uL (ref 0.00–0.07)
Basophils Absolute: 0 10*3/uL (ref 0.0–0.1)
Basophils Relative: 0 %
Eosinophils Absolute: 0.4 10*3/uL (ref 0.0–0.5)
Eosinophils Relative: 5 %
HCT: 36 % (ref 36.0–46.0)
Hemoglobin: 10.9 g/dL — ABNORMAL LOW (ref 12.0–15.0)
Immature Granulocytes: 0 %
Lymphocytes Relative: 13 %
Lymphs Abs: 1 10*3/uL (ref 0.7–4.0)
MCH: 31.8 pg (ref 26.0–34.0)
MCHC: 30.3 g/dL (ref 30.0–36.0)
MCV: 105 fL — ABNORMAL HIGH (ref 80.0–100.0)
Monocytes Absolute: 0.9 10*3/uL (ref 0.1–1.0)
Monocytes Relative: 12 %
Neutro Abs: 5.2 10*3/uL (ref 1.7–7.7)
Neutrophils Relative %: 70 %
Platelets: 126 10*3/uL — ABNORMAL LOW (ref 150–400)
RBC: 3.43 MIL/uL — ABNORMAL LOW (ref 3.87–5.11)
RDW: 17.5 % — ABNORMAL HIGH (ref 11.5–15.5)
WBC: 7.5 10*3/uL (ref 4.0–10.5)
nRBC: 0.4 % — ABNORMAL HIGH (ref 0.0–0.2)

## 2023-07-26 LAB — BASIC METABOLIC PANEL
Anion gap: 19 — ABNORMAL HIGH (ref 5–15)
BUN: 43 mg/dL — ABNORMAL HIGH (ref 6–20)
CO2: 19 mmol/L — ABNORMAL LOW (ref 22–32)
Calcium: 8.7 mg/dL — ABNORMAL LOW (ref 8.9–10.3)
Chloride: 102 mmol/L (ref 98–111)
Creatinine, Ser: 6.91 mg/dL — ABNORMAL HIGH (ref 0.44–1.00)
GFR, Estimated: 8 mL/min — ABNORMAL LOW (ref >60)
Glucose, Bld: 106 mg/dL — ABNORMAL HIGH (ref 70–99)
Potassium: 4.5 mmol/L (ref 3.5–5.1)
Sodium: 140 mmol/L (ref 135–145)

## 2023-07-26 LAB — GLUCOSE, CAPILLARY: Glucose-Capillary: 95 mg/dL (ref 70–99)

## 2023-07-26 MED ORDER — GUAIFENESIN ER 600 MG PO TB12
600.0000 mg | ORAL_TABLET | Freq: Two times a day (BID) | ORAL | Status: DC
  Administered 2023-07-26 – 2023-07-27 (×2): 600 mg via ORAL
  Filled 2023-07-26 (×2): qty 1

## 2023-07-26 MED ORDER — FERRIC CITRATE 1 GM 210 MG(FE) PO TABS
420.0000 mg | ORAL_TABLET | Freq: Three times a day (TID) | ORAL | Status: DC
  Administered 2023-07-26 – 2023-07-27 (×2): 420 mg via ORAL
  Filled 2023-07-26 (×4): qty 2

## 2023-07-26 MED ORDER — HEPARIN SODIUM (PORCINE) 5000 UNIT/ML IJ SOLN
5000.0000 [IU] | Freq: Three times a day (TID) | INTRAMUSCULAR | Status: DC
  Filled 2023-07-26 (×2): qty 1

## 2023-07-26 MED ORDER — LOPERAMIDE HCL 2 MG PO CAPS
2.0000 mg | ORAL_CAPSULE | ORAL | Status: DC | PRN
  Administered 2023-07-26: 2 mg via ORAL
  Filled 2023-07-26: qty 1

## 2023-07-26 NOTE — Progress Notes (Signed)
[facility]                                                                               [name], is a 32 y.o. female, DOB - 08/27/1990, ZOX:[id number] Admit date - 07/23/2023    Outpatient Primary MD for the patient is Dimas Aguas, Binnie Rail, FNP  LOS - 2  days    Brief summary    [name] is a 32 y.o. 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  She was admitted for acute respiratory failure with hypoxia secondary to pneumonia and fluid overload.   Assessment & Plan    Assessment and Plan:    Acute respiratory failure with hypoxia secondary to pneumonia and fluid overload.  Febrile, tachycardic, tachypnea on  admission.  Started on IV antibiotics and HD session on admission.  Pt reports worsening cough and dyspnea this morning. CXR shows persistent middle lobe consolidation.  Continue with IV Antibiotics. Get sputum cultures.  Echocardiogram done 12/26 showed severely  elevated pulmonary artery systolic pressure.  [employer] oxygen to keep sats greater than 90%.    ESRD on HD MWF.  Nephrology on board.    Spinal Bifida with caudal regression syndrome Wheel chair bound.    Chronic diastolic CHF Echo 12/26 showed elevated PA systolic pressures and  small pericardial effusion.  Fluid management with HD.    Anemia  Macrocytic.  Anemia panel shows adequate iron, folate, and vit b12 levels.  Hemoglobin stable.    Thrombocytopenia Improved. Monitor.   GERD  Continue with pantoprazole 40 mg daily.    Estimated body mass index is 45.69 kg/m as calculated from the following:   Height as of 07/15/23: 3' (0.914 m).   Weight as of this encounter: 38.2 kg.  Code Status: full code.  DVT Prophylaxis:     Level of Care: Level of care:  Progressive Family Communication: none at bedside.   Disposition Plan:     Remains inpatient appropriate:  dyspnea and hypoxia.   Procedures:  HD   Consultants:   Nephrology.   Antimicrobials:   Anti-infectives (From admission, onward)    Start     Dose/Rate Route Frequency Ordered Stop   07/24/23 0715  piperacillin-tazobactam (ZOSYN) IVPB 2.25 g        2.25 g 100 mL/hr over 30 Minutes Intravenous Every 8 hours 07/24/23 0712     07/24/23 0328  vancomycin variable dose per unstable renal function (pharmacist dosing)  Status:  Discontinued         Does not apply See admin instructions 07/24/23 0329 07/24/23 1047   07/24/23 0300  vancomycin (VANCOREADY) IVPB 750 mg/150 mL        750 mg 150 mL/hr over 60 Minutes Intravenous  Once 07/24/23 0204 07/24/23 0737   07/24/23 0000  vancomycin (VANCOCIN) 750 mg in sodium chloride 0.9 % 250 mL IVPB  Status:  Discontinued        750 mg 265 mL/hr over 60 Minutes Intravenous  Once 07/23/23 2357 07/24/23 0203  07/23/23 2300  piperacillin-tazobactam (ZOSYN) IVPB 2.25 g        2.25 g 100 mL/hr over 30 Minutes Intravenous  Once 07/23/23 2252 07/24/23 0737   07/23/23 2300  vancomycin (VANCOREADY) IVPB 750 mg/150 mL  Status:  Discontinued        750 mg 150 mL/hr over 60 Minutes Intravenous  Once 07/23/23 2252 07/23/23 2357        Medications  Scheduled Meds:  ferric citrate  420 mg Oral TID WC   hydrALAZINE  25 mg Oral Daily   metoprolol succinate  100 mg Oral Daily   pantoprazole  40 mg Oral Daily   Continuous Infusions:  piperacillin-tazobactam (ZOSYN)  IV 2.25 g (07/26/23 0626)   PRN Meds:.acetaminophen **OR** acetaminophen, albuterol, benzonatate, guaiFENesin-dextromethorphan, ondansetron **OR** ondansetron (ZOFRAN) IV    Subjective:   [name] was seen and examined today.  More sob this am, still coughing.   Objective:   Vitals:   07/25/23 2336 07/26/23 0335 07/26/23 0806 07/26/23 1057  BP: (!) 133/91 126/80 (!)  140/109 114/86  Pulse:   73   Resp:  20 15 (!) 22  Temp: 98.6 F (37 C) 98.7 F (37.1 C) 98.4 F (36.9 C)   TempSrc: Oral Oral Oral   SpO2:   98%   Weight:  38.2 kg      Intake/Output Summary (Last 24 hours) at 07/26/2023 1345 Last data filed at 07/25/2023 1900 Gross per 24 hour  Intake 131.36 ml  Output --  Net 131.36 ml   Filed Weights   07/24/23 2148 07/25/23 0615 07/26/23 0335  Weight: 34.2 kg 35.4 kg 38.2 kg     Exam General exam: Appears calm and comfortable  Respiratory system: diminished at bases,  Cardiovascular system: S1 & S2 heard, RRR.  Gastrointestinal system: Abdomen is nondistended, soft and nontender Central nervous system: Alert and oriented.  Extremities: underdeveloped lower extremities.  Skin: No rashes,  Psychiatry:  Mood & affect appropriate.      Data Reviewed:  I have personally reviewed following labs and imaging studies   CBC Haynes Results  Component Value Date   WBC 7.5 07/26/2023   RBC 3.43 (L) 07/26/2023   HGB 10.9 (L) 07/26/2023   HCT 36.0 07/26/2023   MCV 105.0 (H) 07/26/2023   MCH 31.8 07/26/2023   PLT 126 (L) 07/26/2023   MCHC 30.3 07/26/2023   RDW 17.5 (H) 07/26/2023   LYMPHSABS 1.0 07/26/2023   MONOABS 0.9 07/26/2023   EOSABS 0.4 07/26/2023   BASOSABS 0.0 07/26/2023     Last metabolic panel Haynes Results  Component Value Date   NA 140 07/26/2023   K 4.5 07/26/2023   CL 102 07/26/2023   CO2 19 (L) 07/26/2023   BUN 43 (H) 07/26/2023   CREATININE 6.91 (H) 07/26/2023   GLUCOSE 106 (H) 07/26/2023   GFRNONAA 8 (L) 07/26/2023   GFRAA 9 (L) 04/30/2020   CALCIUM 8.7 (L) 07/26/2023   PHOS 4.6 07/15/2023   PROT 7.6 07/24/2023   ALBUMIN 3.3 (L) 07/24/2023   BILITOT 1.5 (H) 07/24/2023   ALKPHOS 124 07/24/2023   AST 38 07/24/2023   ALT 20 07/24/2023   ANIONGAP 19 (H) 07/26/2023    CBG (last 3)  Recent Labs    07/23/23 2243 07/24/23 0913 07/26/23 0149  GLUCAP 100* 71 95      Coagulation Profile: No results  for input(s): "INR", "PROTIME" in the last 168 hours.   Radiology Studies: No results found.  Kathlen Mody M.D. [facility] 07/26/2023, 1:45 PM  Available via Epic secure chat 7am-7pm After 7 pm, please refer to night coverage provider listed on amion.

## 2023-07-26 NOTE — Progress Notes (Addendum)
Subjective: Asking for cough syrup /complains of runny nose/HD tomorrow inpt, noted admit team wanted IV antibiotics 2 days yesterday..,  Noted large drink cup at bedside, stressed volume restriction to her with her ESRD  Objective Vital signs in last 24 hours: Vitals:   07/25/23 1916 07/25/23 2336 07/26/23 0335 07/26/23 0806  BP: (!) 127/102 (!) 133/91 126/80 (!) 140/109  Pulse: 82   73  Resp:   20 15  Temp: 98.9 F (37.2 C) 98.6 F (37 C) 98.7 F (37.1 C) 98.4 F (36.9 C)  TempSrc: Oral Oral Oral Oral  SpO2: 94%   98%  Weight:   38.2 kg    Weight change: 4 kg   Physical Exam: General: Alert young female NAD, some facial edema this morning questionable excess volume from last night Heart: RRR, 2/6 systolic outright murmur, no rub or gallop Lungs: Bilateral wheeze, nonlabored breathing room air, last O2 sat 98% room air Abdomen: NABS soft NT ND Extremities: Contracted lower extremities no edema appreciated Dialysis Access: LUA AV fistula positive bruit with aneurysmal development    OP dialysis prescription: [facility], MWF,(except holiday schedule Sunday Tuesday Friday starting 12/29) 180 dialyzer, 3 hours 30 minutes, BFR 300/DFR 500, EDW 31 kg, 2.0 potassium/2.0 calcium, UF profile #2, no heparin, left BCF, Mircera 50 mcg every 4 weeks, calcitriol 1.25 mcg 3 times weekly     Problem/Plan: Acute hypoxic respiratory failure= probably etiology of commendation community-acquired pneumonia and volume overload /noted does have as needed guaifenesin ordered, HD UF tomorrow, see below HD schedule ESRD -HD MWF, currently on schedule,  outpatient holiday schedule dialysis today/if discharged I will notify kidney center unfortunately no dialysis unless emergency In Hospital, HTN/volume -some BP elevation this morning/as OP controlled with HD /some volume  up today but thinks can wait for HD tomorrow a.m. Hyperkalemia= K 4.5 resolved with HD Anemia -Hgb 10.9 continue ESA as  outpatient given every 4 weeks Secondary hyperparathyroidism -corrected calcium at goal, continue Auryxia as phosphate binder   [name], PA-C [facility] [phone] 07/26/2023,8:10 AM  LOS: 2 days   Labs: Basic Metabolic Panel: Recent Labs  Lab 07/23/23 2000 07/23/23 2357 07/24/23 0900 07/26/23 0311  NA 139  --  135 140  K 5.7*  --  4.6 4.5  CL 97*  --  95* 102  CO2 21*  --  24 19*  GLUCOSE 73 80 88 106*  BUN 56*  --  20 43*  CREATININE 7.34*  --  3.92* 6.91*  CALCIUM 9.5  --  8.9 8.7*   Liver Function Tests: Recent Labs  Lab 07/23/23 2000 07/24/23 0900  AST 28 38  ALT 18 20  ALKPHOS 135* 124  BILITOT 1.1 1.5*  PROT 7.9 7.6  ALBUMIN 3.7 3.3*   No results for input(s): "LIPASE", "AMYLASE" in the last 168 hours. No results for input(s): "AMMONIA" in the last 168 hours. CBC: Recent Labs  Lab 07/23/23 2000 07/24/23 0800 07/26/23 0311  WBC 7.6 6.4 7.5  NEUTROABS 5.8  --  5.2  HGB 10.7* 9.8* 10.9*  HCT 35.4* 32.8* 36.0  MCV 105.0* 106.1* 105.0*  PLT 128* 71* 126*   Cardiac Enzymes: No results for input(s): "CKTOTAL", "CKMB", "CKMBINDEX", "TROPONINI" in the last 168 hours. CBG: Recent Labs  Lab 07/23/23 2243 07/24/23 0913 07/26/23 0149  GLUCAP 100* 71 95    Studies/Results: No results found. Medications:  piperacillin-tazobactam (ZOSYN)  IV 2.25 g (07/26/23 0626)    hydrALAZINE  25 mg Oral  Daily   metoprolol succinate  100 mg Oral Daily   pantoprazole  40 mg Oral Daily

## 2023-07-27 DIAGNOSIS — J9601 Acute respiratory failure with hypoxia: Secondary | ICD-10-CM | POA: Diagnosis not present

## 2023-07-27 DIAGNOSIS — J189 Pneumonia, unspecified organism: Secondary | ICD-10-CM | POA: Diagnosis not present

## 2023-07-27 DIAGNOSIS — I5031 Acute diastolic (congestive) heart failure: Secondary | ICD-10-CM

## 2023-07-27 HISTORY — DX: Pneumonia, unspecified organism: J18.9

## 2023-07-27 LAB — CBC WITH DIFFERENTIAL/PLATELET
Abs Immature Granulocytes: 0.03 10*3/uL (ref 0.00–0.07)
Basophils Absolute: 0 10*3/uL (ref 0.0–0.1)
Basophils Relative: 1 %
Eosinophils Absolute: 0.4 10*3/uL (ref 0.0–0.5)
Eosinophils Relative: 6 %
HCT: 35.9 % — ABNORMAL LOW (ref 36.0–46.0)
Hemoglobin: 11.1 g/dL — ABNORMAL LOW (ref 12.0–15.0)
Immature Granulocytes: 0 %
Lymphocytes Relative: 14 %
Lymphs Abs: 1.1 10*3/uL (ref 0.7–4.0)
MCH: 31.9 pg (ref 26.0–34.0)
MCHC: 30.9 g/dL (ref 30.0–36.0)
MCV: 103.2 fL — ABNORMAL HIGH (ref 80.0–100.0)
Monocytes Absolute: 0.9 10*3/uL (ref 0.1–1.0)
Monocytes Relative: 12 %
Neutro Abs: 5 10*3/uL (ref 1.7–7.7)
Neutrophils Relative %: 67 %
Platelets: 117 10*3/uL — ABNORMAL LOW (ref 150–400)
RBC: 3.48 MIL/uL — ABNORMAL LOW (ref 3.87–5.11)
RDW: 17.2 % — ABNORMAL HIGH (ref 11.5–15.5)
WBC: 7.5 10*3/uL (ref 4.0–10.5)
nRBC: 0.3 % — ABNORMAL HIGH (ref 0.0–0.2)

## 2023-07-27 LAB — BASIC METABOLIC PANEL
Anion gap: 16 — ABNORMAL HIGH (ref 5–15)
BUN: 50 mg/dL — ABNORMAL HIGH (ref 6–20)
CO2: 20 mmol/L — ABNORMAL LOW (ref 22–32)
Calcium: 8.7 mg/dL — ABNORMAL LOW (ref 8.9–10.3)
Chloride: 98 mmol/L (ref 98–111)
Creatinine, Ser: 8.35 mg/dL — ABNORMAL HIGH (ref 0.44–1.00)
GFR, Estimated: 6 mL/min — ABNORMAL LOW (ref >60)
Glucose, Bld: 78 mg/dL (ref 70–99)
Potassium: 4 mmol/L (ref 3.5–5.1)
Sodium: 134 mmol/L — ABNORMAL LOW (ref 135–145)

## 2023-07-27 MED ORDER — AMOXICILLIN-POT CLAVULANATE 500-125 MG PO TABS: 1.0000 | ORAL_TABLET | Freq: Every day | ORAL | 0 refills | Status: AC

## 2023-07-27 NOTE — Discharge Planning (Signed)
[facility] Patient Discharge Orders- [facility] CLINIC: [facility]  Patient's name: [name] Admit/DC Dates: 07/23/2023 - 07/27/23  Discharge Diagnoses: Acute hypoxic respiratory failure    Pneumonia/volume overload  Aranesp: Given: no   Date and amount of last dose: n/a  Last Hgb: 11.1 PRBC's Given: no Date/# of units: n/a ESA dose for discharge: mircera 50 mcg IV q 4 weeks  IV Iron dose at discharge: none  Heparin change: no  EDW Change: no New EDW:   [place] Change: no  Access intervention/Change: no Details:  Hectorol/Calcitriol change: no  Discharge Labs: Calcium 8.7 Phosphorus 4.8 Albumin 3.3 K+ 4.0  IV Antibiotics: No (on PO augmentin) Details:  On Coumadin?: no Last INR: Next INR: Managed By:   OTHER/APPTS/LAB ORDERS:    D/C Meds to be reconciled by nurse after every discharge.  Completed By: Rogers Blocker, PA-C 07/27/2023, 12:21 PM  [facility] Pager: [phone]   Reviewed by: MD:______ RN_______

## 2023-07-27 NOTE — Progress Notes (Signed)
   07/27/23 1235  Vitals  Temp 97.8 F (36.6 C)  Pulse Rate 94  Resp 19  BP 122/84  SpO2 100 %  Weight 36 kg  Type of Weight Post-Dialysis  Post Treatment  Dialyzer Clearance Lightly streaked  Hemodialysis Intake (mL) 0 mL  Liters Processed 54  Fluid Removed (mL) 2000 mL  Tolerated HD Treatment Yes  AVG/AVF Arterial Site Held (minutes) 10 minutes  AVG/AVF Venous Site Held (minutes) 10 minutes   Received patient in bed to unit.  Alert and oriented.  Informed consent signed and in chart.   TX duration:3 hrs  Patient tolerated well.  Transported back to the room  Alert, without acute distress.  Hand-off given to patient's nurse.   Access used: LAVF Access issues: none  Total UF removed: 2L Medication(s) given: zofran PO    [name]

## 2023-07-27 NOTE — Discharge Summary (Signed)
Physician Discharge Summary  [name] UVO:[id number] DOB: 28-Dec-1990 DOA: 07/23/2023  PCP: Park Meo, FNP  Admit date: 07/23/2023 Discharge date: 07/27/2023  Admitted From: Home Disposition: Home  Recommendations for Outpatient Follow-up:  Follow up with PCP in 1-2 weeks Continue antibiotics with Augmentin to complete 7-day course for pneumonia  Home Health: No Equipment/Devices: None   Discharge Condition: Stable CODE STATUS: Full code Diet recommendation: Renal diet with 1200 mL fluid restriction  History of present illness:  [name] is a 32 y.o. female with past medical history significant for ESRD on HD MWF, spina bifida with caudal regression syndrome (wheelchair-bound at baseline), depression, chronic diastolic congestive heart failure, right ventricular failure, severe pulmonary HTN, mitral stenosis, GERD who presented to [facility] ED on 12/26 with progressive shortness of breath.  Recently hospitalized and discharged on 12/19 for volume overload requiring hemodialysis while inpatient.  Currently on hold off" schedule due to holiday.  Last hemodialysis on 07/20/2023 and due on day of admission.  In the ED, temperature 100.8 F, HR 105, RR 39, BP 143/85, SpO2 100% on simple facemask.  WBC 7.6, hemoglobin 10.7, platelet count 128.  Sodium 139, potassium 5.7, chloride 97, CO2 21, glucose 33, BUN 56, creatinine 7.34.  AST 28, ALT 18, total bilirubin 1.1.  BNP 2478.9.  COVID/influenza/RSV PCR negative.  Chest x-ray with low lung volumes, persistent large cardiac silhouette with loculated right pleural effusion small volume, question right middle lobe consolidation.  Patient was placed on BiPAP due to tachypnea, nephrology was consulted for hemodialysis.  TRH consulted for admission for further evaluation and management of acute hypoxic respite failure secondary to volume overload/diastolic CHF exacerbation with concomitant community-acquired pneumonia.  Hospital  course:  Acute hypoxic respiratory failure, POA: Resolved Patient presenting to ED with progressive shortness of breath, was noted to be tachypneic, tachycardic with temperature of 100.8 F.  COVID/influenza/RSV PCR negative.  Chest x-ray with loculated right pleural effusion which is small, enlarged cardiac silhouette and questionable right middle lobe pneumonia.  Etiology likely multifactorial with community-acquired pneumonia as well as volume overload/CHF exacerbation.  Patient initially requiring BiPAP which was transition to nasal cannula and eventually titrated off.  Treated as below.  Decompensated diastolic CHF exacerbation, POA Patient presenting with progressive shortness of breath, currently on "off" schedule due to holiday for hemodialysis.  Patient was noted to be tachypneic, tachycardic, hypoxic on arrival initially requiring BiPAP.  BNP elevated 2478.9.  Nephrology was consulted and patient underwent hemodialysis with improvement of symptoms and was able to be titrated off of supplemental oxygen.  Patient will receive hemodialysis today, 07/27/2023 with repeat hemodialysis planned for tomorrow outpatient on 07/28/2023 on holiday schedule and then will resume regular schedule hemodialysis on Friday, July 31, 2023.  Continue fluid restriction diet, 1200 mL per daily.  Recommend following weights frequently.  Outpatient follow-up with nephrology.  Hyperkalemia Potassium 5.7 on admission, treated with hemodialysis.  Continue home Lokelma 10 g every Monday/Wednesday/Friday  Community-acquired pneumonia Patient with tachycardia, tachypnea, hypoxia and fever of 100.8 F on admission.  Chest x-ray with right middle lobe consolidation.  Patient was treated with Zosyn and will continue Augmentin on discharge to complete 7-day course.  Oxygen titrated off as above.  ESRD on HD MWF Nephrology was consulted and patient underwent hemodialysis while inpatient.  Patient currently on holiday schedule  with hemodialysis on a Sunday/Tuesday/Friday schedule this week.  Receives outpatient HD at [facility] normally on a Monday/Wednesday/Friday schedule.  Spina bifida with caudal regression syndrome Wheelchair dependent  at baseline  Essential hypertension Continue metoprolol succinate 100 mg p.o. daily, hydralazine 25 mg p.o. daily.  GERD Continue omeprazole 40 mg p.o. daily    Discharge Diagnoses:  Principal Problem:   Acute renal failure [facility])    Discharge Instructions  Discharge Instructions     Call MD for:  difficulty breathing, headache or visual disturbances   Complete by: As directed    Call MD for:  extreme fatigue   Complete by: As directed    Call MD for:  persistant dizziness or light-headedness   Complete by: As directed    Call MD for:  persistant nausea and vomiting   Complete by: As directed    Call MD for:  severe uncontrolled pain   Complete by: As directed    Call MD for:  temperature >100.4   Complete by: As directed    Diet - low sodium heart healthy   Complete by: As directed    Increase activity slowly   Complete by: As directed       Allergies as of 07/27/2023       Reactions   Ciprofloxacin Shortness Of Breath, Nausea And Vomiting, Other (See Comments)   HIGH FEVER and oral blisters    Other Anaphylaxis   Revaclear dialzer   Peanut-containing Drug Products Anaphylaxis   Aleve [naproxen Sodium] Other (See Comments)   G.I.Bleed   Ceftriaxone Other (See Comments)   Blisters in mouth   Coconut (cocos Nucifera) Hives   Influenza Vaccines Nausea And Vomiting, Other (See Comments)   High fever   Tetanus Toxoid, Adsorbed Nausea And Vomiting, Other (See Comments)   HIGH FEVER, also   Tetanus Toxoids Nausea And Vomiting, Other (See Comments)   HIGH FEVER   Latex Itching, Rash        Medication List     TAKE these medications    acetaminophen 500 MG tablet Commonly known as: TYLENOL Take 1,000 mg by mouth every 6 (six) hours as needed for  mild pain or headache.   albuterol 108 (90 Base) MCG/ACT inhaler Commonly known as: VENTOLIN HFA Inhale 1-2 puffs into the lungs every 6 (six) hours as needed for wheezing or shortness of breath.   amoxicillin-clavulanate 500-125 MG tablet Commonly known as: Augmentin Take 1 tablet by mouth daily for 4 days. Take after hemodialysis on dialysis days   Auryxia 1 GM 210 MG(Fe) tablet Generic drug: ferric citrate Take 420 mg by mouth 2 (two) times daily with a meal.   DIALYVITE TABLET Tabs Take 1 tablet by mouth daily.   diphenhydrAMINE 25 mg capsule Commonly known as: BENADRYL Take 25 mg by mouth daily as needed for allergies.   EPINEPHrine 0.3 mg/0.3 mL Soaj injection Commonly known as: EPI-PEN Inject 0.3 mg into the muscle as needed for anaphylaxis.   hydrALAZINE 25 MG tablet Commonly known as: APRESOLINE Take 25 mg by mouth daily.   metoprolol succinate 100 MG 24 hr tablet Commonly known as: TOPROL-XL Take 100 mg by mouth daily.   omeprazole 40 MG capsule Commonly known as: PRILOSEC Take 40 mg by mouth in the morning and at bedtime.   sodium zirconium cyclosilicate 5 g packet Commonly known as: LOKELMA Take 10 g by mouth every Monday, Wednesday, and Friday.        Follow-up Information     Park Meo, FNP. Schedule an appointment as soon as possible for a visit in 1 week(s).   Specialty: Family Medicine Contact information: [address]  [place] [address] [phone]                Allergies  Allergen Reactions   Ciprofloxacin Shortness Of Breath, Nausea And Vomiting and Other (See Comments)    HIGH FEVER and oral blisters    Other Anaphylaxis    Revaclear dialzer   Peanut-Containing Drug Products Anaphylaxis   Aleve [Naproxen Sodium] Other (See Comments)    G.I.Bleed   Ceftriaxone Other (See Comments)    Blisters in mouth    Coconut (Cocos Nucifera) Hives   Influenza Vaccines Nausea And Vomiting and Other (See Comments)     High fever   Tetanus Toxoid, Adsorbed Nausea And Vomiting and Other (See Comments)    HIGH FEVER, also   Tetanus Toxoids Nausea And Vomiting and Other (See Comments)    HIGH FEVER   Latex Itching and Rash    Consultations: Nephrology   Procedures/Studies: VAS [place] UPPER EXTREMITY VENOUS DUPLEX Result Date: 07/27/2023 UPPER VENOUS STUDY  Patient Name:  [name]  Date of Exam:   07/26/2023 Medical Rec #: [id number]         Accession #:    [id number] Date of Birth: [birth year]         Patient Gender: F Patient Age:   32 years Exam Location:  [facility] Procedure:      VAS [place] UPPER EXTREMITY VENOUS DUPLEX Referring Phys: Kathlen Mody --------------------------------------------------------------------------------  Indications: Evaluate for DVT Comparison Study: Previous study of the left upper extremity on 2.23.2021. Performing Technologist: Fernande Bras  Examination Guidelines: A complete evaluation includes B-mode imaging, spectral Doppler, color Doppler, and power Doppler as needed of all accessible portions of each vessel. Bilateral testing is considered an integral part of a complete examination. Limited examinations for reoccurring indications may be performed as noted.  Right Findings: +----------+------------+---------+-----------+----------+-------+ RIGHT     CompressiblePhasicitySpontaneousPropertiesSummary +----------+------------+---------+-----------+----------+-------+ IJV           Full       Yes       Yes                      +----------+------------+---------+-----------+----------+-------+ Subclavian    Full       Yes       Yes                      +----------+------------+---------+-----------+----------+-------+ Axillary      Full       Yes       Yes                      +----------+------------+---------+-----------+----------+-------+ Brachial      Full       Yes       Yes                       +----------+------------+---------+-----------+----------+-------+ Radial        Full                                          +----------+------------+---------+-----------+----------+-------+ Ulnar         Full                                          +----------+------------+---------+-----------+----------+-------+  Cephalic      Full       Yes       Yes                      +----------+------------+---------+-----------+----------+-------+ Basilic       Full       Yes       Yes                      +----------+------------+---------+-----------+----------+-------+  Left Findings: +----------+------------+---------+-----------+----------+-------+ LEFT      CompressiblePhasicitySpontaneousPropertiesSummary +----------+------------+---------+-----------+----------+-------+ IJV           Full       Yes       Yes                      +----------+------------+---------+-----------+----------+-------+ Subclavian    Full       Yes       Yes                      +----------+------------+---------+-----------+----------+-------+ Axillary      Full       Yes       Yes                      +----------+------------+---------+-----------+----------+-------+ Brachial      Full       Yes       Yes                      +----------+------------+---------+-----------+----------+-------+ Radial        Full                                          +----------+------------+---------+-----------+----------+-------+ Ulnar         Full                                          +----------+------------+---------+-----------+----------+-------+ Cephalic      Full       Yes       Yes                      +----------+------------+---------+-----------+----------+-------+ Basilic       Full       Yes       Yes                      +----------+------------+---------+-----------+----------+-------+ Left AVF patent. Left cephalic in forearm compressible.   Summary:  Right: No evidence of deep vein thrombosis in the upper extremity. No evidence of superficial vein thrombosis in the upper extremity.  Left: No evidence of deep vein thrombosis in the upper extremity. No evidence of superficial vein thrombosis in the upper extremity.  *See table(s) above for measurements and observations.  Diagnosing physician: Carolynn Sayers Electronically signed by Carolynn Sayers on 07/27/2023 at 10:57:52 AM.    Final    DG CHEST PORT 1 VIEW Result Date: 07/26/2023 CLINICAL DATA:  Dyspnea. EXAM: PORTABLE CHEST 1 VIEW COMPARISON:  July 23, 2023. FINDINGS: Stable cardiomegaly. Small loculated right pleural effusion is noted which is slightly enlarged compared to prior exam. Minimal right basilar subsegmental atelectasis is noted. Minimal left pleural effusion  is noted. Bony thorax is unremarkable. IMPRESSION: Small loculated right pleural effusion is noted which is slightly enlarged compared to prior exam. Minimal right basilar subsegmental atelectasis is noted. Minimal left pleural effusion. Electronically Signed   By: Lupita Raider M.D.   On: 07/26/2023 13:50   DG Chest Portable 1 View Result Date: 07/23/2023 CLINICAL DATA:  fluid EXAM: PORTABLE CHEST 1 VIEW COMPARISON:  Chest x-ray 07/15/2023, cxr 05/30/23, ct abd/pelvis 05/30/23 FINDINGS: Persistent enlarged cardiac silhouette. The heart and mediastinal contours are unchanged. Low lung volumes. Question right middle lobe consolidation. No pulmonary edema. Persistent loculated right pleural effusion-at least small volume. No pneumothorax. No acute osseous abnormality. IMPRESSION: 1. Low lung volumes. 2. Persistent enlarged cardiac silhouette with underlying pericardial effusion not excluded. 3. Persistent loculated right pleural effusion-at least small volume. 4. Question right middle lobe consolidation. Electronically Signed   By: Tish Frederickson M.D.   On: 07/23/2023 20:17   ECHOCARDIOGRAM COMPLETE Result Date:  07/23/2023    ECHOCARDIOGRAM REPORT   Patient Name:   [name] Date of Exam: 07/23/2023 Medical Rec #:  [id number]        Height:       36.0 in Accession #:    [id number]       Weight:       70.1 lb Date of Birth:  [birth year]        BSA:          0.825 m Patient Age:    32 years         BP:           116/85 mmHg Patient Gender: F                HR:           92 bpm. Exam Location:  Outpatient Procedure: 2D Echo, Color Doppler and Cardiac Doppler Indications:    I50.9 Congestive Heart Failure  History:        Patient has prior history of Echocardiogram examinations. CHF,                 ESRD, Mitral Stenosis; Risk Factors:Hypertension.  Sonographer:    L. Thornton-Maynard, RDCS Referring Phys: [id number] [facility]  Sonographer Comments: Patient very short of breath during exam. Patient went to ED following the test IMPRESSIONS  1. COmpared to echo from Dec 2023, estimated pulmonary pressure is increased.  2. Septal flattening consistent with RV volume / pressure overload     Narrow LVOT (12 mm) with turbulent outflow Turbulent flow through LV, LVOT with near cavity obliteration No significant gradient at rest.. Left ventricular ejection fraction, by estimation, is >75%. The left ventricle has hyperdynamic function. The left ventricle has no regional wall motion abnormalities. There is mild concentric left ventricular hypertrophy. Left ventricular diastolic parameters are consistent with Grade II diastolic dysfunction (pseudonormalization). Elevated left atrial pressure.  3. The right ventricular size is moderately enlarged. There is severely elevated pulmonary artery systolic pressure.  4. Left atrial size was moderately dilated.  5. Right atrial size was moderately dilated.  6. A small pericardial effusion is present.  7. Mitral valve is thickened, calcified. Mean gradient through the valve is 13 mmHG (HR 90 bpm) Overall consistent with severe MS COmpared to echo from Dec 2023, mean gradient is increased.  Mild mitral valve regurgitation. Severe mitral annular calcification.  8. The tricuspid valve is degenerative. Tricuspid valve regurgitation is severe.  9. The aortic valve is tricuspid. Aortic valve regurgitation  is not visualized. Aortic valve sclerosis is present, with no evidence of aortic valve stenosis. 10. Pulmonic valve regurgitation is moderate. FINDINGS  Left Ventricle: Septal flattening consistent with RV volume / pressure overload Narrow LVOT (12 mm) with turbulent outflow Turbulent flow through LV, LVOT with near cavity obliteration No significant gradient at rest. Left ventricular ejection fraction, by estimation, is >75%. The left ventricle has hyperdynamic function. The left ventricle has no regional wall motion abnormalities. The left ventricular internal cavity size was small. There is mild concentric left ventricular hypertrophy. Left ventricular diastolic parameters are consistent with Grade II diastolic dysfunction (pseudonormalization). Elevated left atrial pressure. Right Ventricle: The right ventricular size is moderately enlarged. Right vetricular wall thickness was not assessed. There is severely elevated pulmonary artery systolic pressure. The tricuspid regurgitant velocity is 5.59 m/s, and with an assumed right  atrial pressure of 15 mmHg, the estimated right ventricular systolic pressure is 140.0 mmHg. Left Atrium: Left atrial size was moderately dilated. Right Atrium: Right atrial size was moderately dilated. Pericardium: A small pericardial effusion is present. Mitral Valve: Mitral valve is thickened, calcified. Mean gradient through the valve is 13 mmHG (HR 90 bpm) Overall consistent with severe MS COmpared to echo from Dec 2023, mean gradient is increased. There is moderate thickening of the mitral valve leaflet(s). There is severe calcification of the mitral valve leaflet(s). Severe mitral annular calcification. Mild mitral valve regurgitation. MV peak gradient, 19.0 mmHg. The mean  mitral valve gradient is 14.0 mmHg. Tricuspid Valve: The tricuspid valve is degenerative in appearance. Tricuspid valve regurgitation is severe. Aortic Valve: The aortic valve is tricuspid. Aortic valve regurgitation is not visualized. Aortic valve sclerosis is present, with no evidence of aortic valve stenosis. Aortic valve mean gradient measures 9.0 mmHg. Aortic valve peak gradient measures 17.1 mmHg. Aortic valve area, by VTI measures 0.95 cm. Pulmonic Valve: The pulmonic valve was grossly normal. Pulmonic valve regurgitation is moderate. Aorta: The aortic root and ascending aorta are structurally normal, with no evidence of dilitation. IAS/Shunts: No atrial level shunt detected by color flow Doppler.  LEFT VENTRICLE PLAX 2D LVIDd:         2.60 cm     Diastology LVIDs:         1.40 cm     LV e' medial:    4.70 cm/s LV PW:         1.20 cm     LV E/e' medial:  47.0 LV IVS:        1.20 cm     LV e' lateral:   4.87 cm/s LVOT diam:     1.25 cm     LV E/e' lateral: 45.4 LV SV:         27 LV SV Index:   32 LVOT Area:     1.23 cm  LV Volumes (MOD) LV vol d, MOD A2C: 25.7 ml LV vol d, MOD A4C: 30.0 ml LV vol s, MOD A2C: 6.1 ml LV vol s, MOD A4C: 6.8 ml LV SV MOD A2C:     19.6 ml LV SV MOD A4C:     30.0 ml LV SV MOD BP:      24.6 ml RIGHT VENTRICLE             IVC RV Basal diam:  4.00 cm     IVC diam: 2.40 cm RV Mid diam:    3.20 cm RV S prime:     10.20 cm/s TAPSE (M-mode): 1.2 cm LEFT ATRIUM  Index        RIGHT ATRIUM           Index LA diam:        4.20 cm 5.09 cm/m   RA Area:     15.60 cm LA Vol (A2C):   45.4 ml 55.01 ml/m  RA Volume:   40.80 ml  49.44 ml/m LA Vol (A4C):   30.0 ml 36.35 ml/m LA Biplane Vol: 37.8 ml 45.80 ml/m  AORTIC VALVE                     PULMONIC VALVE AV Area (Vmax):    0.97 cm      PV Vmax:          1.07 m/s AV Area (Vmean):   1.11 cm      PV Peak grad:     4.6 mmHg AV Area (VTI):     0.95 cm      PR End Diast Vel: 3.18 msec AV Vmax:           207.00 cm/s AV Vmean:           135.000 cm/s AV VTI:            0.282 m AV Peak Grad:      17.1 mmHg AV Mean Grad:      9.0 mmHg LVOT Vmax:         164.00 cm/s LVOT Vmean:        122.000 cm/s LVOT VTI:          0.218 m LVOT/AV VTI ratio: 0.77  AORTA Ao Root diam: 1.95 cm Ao Asc diam:  2.20 cm MITRAL VALVE                TRICUSPID VALVE MV Area (PHT): 4.60 cm     TR Peak grad:   125.0 mmHg MV Area VTI:   0.53 cm     TR Vmax:        559.00 cm/s MV Peak grad:  19.0 mmHg MV Mean grad:  14.0 mmHg    SHUNTS MV Vmax:       2.18 m/s     Systemic VTI:  0.22 m MV Vmean:      179.0 cm/s   Systemic Diam: 1.25 cm MV Decel Time: 165 msec MV E velocity: 221.00 cm/s MV A velocity: 209.00 cm/s MV E/A ratio:  1.06 Dietrich Pates MD Electronically signed by Dietrich Pates MD Signature Date/Time: 07/23/2023/6:26:47 PM    Final    DG Chest 2 View Result Date: 07/15/2023 CLINICAL DATA:  Shortness of breath EXAM: CHEST - 2 VIEW COMPARISON:  05/30/2023 FINDINGS: Chronic cardiomegaly. Low lung volumes with pleural fluid on the right. Congested appearance of vessels and dense opacity at the right base attributed to a combination of pleural fluid and airspace disease, interval. No pneumothorax. Coarsened trabecula, there is end-stage renal disease. IMPRESSION: Low volume chest with infiltrate at the right base where there is also overlying pleural fluid. Electronically Signed   By: Tiburcio Pea M.D.   On: 07/15/2023 05:16   CARDIAC CATHETERIZATION Result Date: 06/30/2023 1. Elevated R>>L heart filling pressures, predominantly RV failure. 2. Severe pulmonary arterial hypertension. 3. Preserved cardiac output. Needs more fluid off at HD.  Needs echo to assess mitral valve for mitral stenosis.  Needs further workup of pulmonary hypertension.     Subjective: Patient seen examined bedside, lying in bed.  Currently undergoing hemodialysis.  Complaining of cramping during hemodialysis.  Otherwise no significant complaints this morning.  Dyspnea much improved, oxygenating  well on room air.  Discussed with nephrology, Dr. Arrie Aran; okay for discharge after hemodialysis today with planned repeat HD outpatient tomorrow and then once again on Friday currently on holiday schedule.  Discussed with patient plan posttransplantation to HD tomorrow; states "get picked up around 10 AM".  No other specific questions, concerns or complaints at this time.  Denies headache, no visual changes, no chest pain, palpitations, no shortness of breath, no abdominal pain, no fever/chills/night sweats, no nausea/emesis diarrhea, no focal weakness, no fatigue, no paresthesias.  No acute events overnight per nurse staff.  Discharge Exam: Vitals:   07/27/23 1100 07/27/23 1130  BP: (!) 126/90 123/78  Pulse: 86 94  Resp: (!) 29 18  Temp:    SpO2: 99% 100%   Vitals:   07/27/23 1000 07/27/23 1030 07/27/23 1100 07/27/23 1130  BP: 118/82 124/85 (!) 126/90 123/78  Pulse: 81 89 86 94  Resp: 19 18 (!) 29 18  Temp:      TempSrc:      SpO2: 100% 100% 99% 100%  Weight:        Physical Exam: GEN: NAD, alert and oriented x 3, chronically ill appearance, appears older than stated age HEENT: NCAT, PERRL, EOMI, sclera clear, MMM PULM: Breath sounds diminished bilateral bases, no wheezes/crackles, normal respiratory effort without accessory muscle use, on room air with SpO2 95% at rest. CV: RRR w/o M/G/R GI: abd soft, NTND, NABS, no R/G/M MSK: no peripheral edema PSYCH: normal mood/affect Integumentary: No concerning rashes/lesions/wounds noted on exposed skin surfaces.    The results of significant diagnostics from this hospitalization (including imaging, microbiology, ancillary and laboratory) are listed below for reference.     Microbiology: Recent Results (from the past 240 hours)  Resp panel by RT-PCR (RSV, Flu A&B, Covid) Anterior Nasal Swab     Status: None   Collection Time: 07/23/23  9:35 PM   Specimen: Anterior Nasal Swab  Result Value Ref Range Status   SARS Coronavirus 2  by RT PCR NEGATIVE NEGATIVE Final   Influenza A by PCR NEGATIVE NEGATIVE Final   Influenza B by PCR NEGATIVE NEGATIVE Final    Comment: (NOTE) The Xpert Xpress SARS-CoV-2/FLU/RSV plus assay is intended as an aid in the diagnosis of influenza from Nasopharyngeal swab specimens and should not be used as a sole basis for treatment. Nasal washings and aspirates are unacceptable for Xpert Xpress SARS-CoV-2/FLU/RSV testing.  Fact Sheet for Patients: BloggerCourse.com  Fact Sheet for Healthcare Providers: SeriousBroker.it  This test is not yet approved or cleared by the [place] FDA and has been authorized for detection and/or diagnosis of SARS-CoV-2 by FDA under an Emergency Use Authorization (EUA). This EUA will remain in effect (meaning this test can be used) for the duration of the COVID-19 declaration under Section 564(b)(1) of the Act, 21 U.S.C. section 360bbb-3(b)(1), unless the authorization is terminated or revoked.     Resp Syncytial Virus by PCR NEGATIVE NEGATIVE Final    Comment: (NOTE) Fact Sheet for Patients: BloggerCourse.com  Fact Sheet for Healthcare Providers: SeriousBroker.it  This test is not yet approved or cleared by the [place] FDA and has been authorized for detection and/or diagnosis of SARS-CoV-2 by FDA under an Emergency Use Authorization (EUA). This EUA will remain in effect (meaning this test can be used) for the duration of the COVID-19 declaration under Section 564(b)(1) of the Act, 21 U.S.C. section 360bbb-3(b)(1), unless the authorization is terminated  or revoked.  Performed at [facility], [address], [place], [place] [address]   MRSA Next Gen by PCR, Nasal     Status: None   Collection Time: 07/23/23  9:35 PM   Specimen: Anterior Nasal Swab  Result Value Ref Range Status   MRSA by PCR Next Gen NOT DETECTED NOT DETECTED  Final    Comment: (NOTE) The GeneXpert MRSA Assay (FDA approved for NASAL specimens only), is one component of a comprehensive MRSA colonization surveillance program. It is not intended to diagnose MRSA infection nor to guide or monitor treatment for MRSA infections. Test performance is not FDA approved in patients less than 22 years old. Performed at [facility], [address], [place], [place] [address]      Labs: BNP (last 3 results) Recent Labs    07/15/23 0405 07/23/23 2000  BNP 1,979.9* 2,478.9*   Basic Metabolic Panel: Recent Labs  Lab 07/23/23 2000 07/23/23 2357 07/24/23 0900 07/26/23 0311 07/27/23 0346  NA 139  --  135 140 134*  K 5.7*  --  4.6 4.5 4.0  CL 97*  --  95* 102 98  CO2 21*  --  24 19* 20*  GLUCOSE 73 80 88 106* 78  BUN 56*  --  20 43* 50*  CREATININE 7.34*  --  3.92* 6.91* 8.35*  CALCIUM 9.5  --  8.9 8.7* 8.7*  MG 1.9  --   --   --   --    Liver Function Tests: Recent Labs  Lab 07/23/23 2000 07/24/23 0900  AST 28 38  ALT 18 20  ALKPHOS 135* 124  BILITOT 1.1 1.5*  PROT 7.9 7.6  ALBUMIN 3.7 3.3*   No results for input(s): "LIPASE", "AMYLASE" in the last 168 hours. No results for input(s): "AMMONIA" in the last 168 hours. CBC: Recent Labs  Lab 07/23/23 2000 07/24/23 0800 07/26/23 0311 07/27/23 0346  WBC 7.6 6.4 7.5 7.5  NEUTROABS 5.8  --  5.2 5.0  HGB 10.7* 9.8* 10.9* 11.1*  HCT 35.4* 32.8* 36.0 35.9*  MCV 105.0* 106.1* 105.0* 103.2*  PLT 128* 71* 126* 117*   Cardiac Enzymes: No results for input(s): "CKTOTAL", "CKMB", "CKMBINDEX", "TROPONINI" in the last 168 hours. BNP: Invalid input(s): "POCBNP" CBG: Recent Labs  Lab 07/23/23 2243 07/24/23 0913 07/26/23 0149  GLUCAP 100* 71 95   D-Dimer No results for input(s): "DDIMER" in the last 72 hours. Hgb A1c No results for input(s): "HGBA1C" in the last 72 hours. Lipid Profile No results for input(s): "CHOL", "HDL", "LDLCALC", "TRIG", "CHOLHDL", "LDLDIRECT" in the  last 72 hours. Thyroid function studies No results for input(s): "TSH", "T4TOTAL", "T3FREE", "THYROIDAB" in the last 72 hours.  Invalid input(s): "FREET3" Anemia work up Recent Labs    07/25/23 1525  VITAMINB12 776  FOLATE 21.8  FERRITIN 1,491*  TIBC 316  IRON 90  RETICCTPCT 2.7   Urinalysis    Component Value Date/Time   COLORURINE YELLOW 05/31/2023 0435   APPEARANCEUR HAZY (A) 05/31/2023 0435   LABSPEC 1.011 05/31/2023 0435   PHURINE 9.0 (H) 05/31/2023 0435   GLUCOSEU 50 (A) 05/31/2023 0435   HGBUR SMALL (A) 05/31/2023 0435   BILIRUBINUR NEGATIVE 05/31/2023 0435   KETONESUR NEGATIVE 05/31/2023 0435   PROTEINUR >=300 (A) 05/31/2023 0435   UROBILINOGEN 0.2 05/11/2015 0906   NITRITE NEGATIVE 05/31/2023 0435   LEUKOCYTESUR TRACE (A) 05/31/2023 0435   Sepsis Labs Recent Labs  Lab 07/23/23 2000 07/24/23 0800 07/26/23 0311 07/27/23 0346  WBC 7.6 6.4 7.5 7.5   Microbiology Recent Results (from the past 240 hours)  Resp panel by RT-PCR (RSV, Flu A&B, Covid) Anterior Nasal Swab     Status: None   Collection Time: 07/23/23  9:35 PM   Specimen: Anterior Nasal Swab  Result Value Ref Range Status   SARS Coronavirus 2 by RT PCR NEGATIVE NEGATIVE Final   Influenza A by PCR NEGATIVE NEGATIVE Final   Influenza B by PCR NEGATIVE NEGATIVE Final    Comment: (NOTE) The Xpert Xpress SARS-CoV-2/FLU/RSV plus assay is intended as an aid in the diagnosis of influenza from Nasopharyngeal swab specimens and should not be used as a sole basis for treatment. Nasal washings and aspirates are unacceptable for Xpert Xpress SARS-CoV-2/FLU/RSV testing.  Fact Sheet for Patients: BloggerCourse.com  Fact Sheet for Healthcare Providers: SeriousBroker.it  This test is not yet approved or cleared by the [place] FDA and has been authorized for detection and/or diagnosis of SARS-CoV-2 by FDA under an Emergency Use Authorization (EUA). This  EUA will remain in effect (meaning this test can be used) for the duration of the COVID-19 declaration under Section 564(b)(1) of the Act, 21 U.S.C. section 360bbb-3(b)(1), unless the authorization is terminated or revoked.     Resp Syncytial Virus by PCR NEGATIVE NEGATIVE Final    Comment: (NOTE) Fact Sheet for Patients: BloggerCourse.com  Fact Sheet for Healthcare Providers: SeriousBroker.it  This test is not yet approved or cleared by the [place] FDA and has been authorized for detection and/or diagnosis of SARS-CoV-2 by FDA under an Emergency Use Authorization (EUA). This EUA will remain in effect (meaning this test can be used) for the duration of the COVID-19 declaration under Section 564(b)(1) of the Act, 21 U.S.C. section 360bbb-3(b)(1), unless the authorization is terminated or revoked.  Performed at [facility], [address] [place], [place] [address]   MRSA Next Gen by PCR, Nasal     Status: None   Collection Time: 07/23/23  9:35 PM   Specimen: Anterior Nasal Swab  Result Value Ref Range Status   MRSA by PCR Next Gen NOT DETECTED NOT DETECTED Final    Comment: (NOTE) The GeneXpert MRSA Assay (FDA approved for NASAL specimens only), is one component of a comprehensive MRSA colonization surveillance program. It is not intended to diagnose MRSA infection nor to guide or monitor treatment for MRSA infections. Test performance is not FDA approved in patients less than 59 years old. Performed at [facility], [address] [place], [place] [address]      Time coordinating discharge: Over 30 minutes  SIGNED:   Alvira Philips [place], DO  [facility] 07/27/2023, 11:51 AM

## 2023-07-27 NOTE — TOC Transition Note (Signed)
Transition of Care [facility]) - Discharge Note   Patient Details  Name: [name] MRN: [id number] Date of Birth: 07-12-1991  [facility]) CM/SW Contact:  Ronny Bacon, RN Phone Number: 07/27/2023, 2:26 PM   Clinical Narrative:     Patient is being discharged home today. PTAR arranged from transportation home due to patient having spina bifida.         Patient Goals and CMS Choice            Discharge Placement                       Discharge Plan and Services Additional resources added to the After Visit Summary for                                       Social Drivers of Health (SDOH) Interventions SDOH Screenings   Food Insecurity: No Food Insecurity (07/24/2023)  Housing: Low Risk  (07/24/2023)  Transportation Needs: No Transportation Needs (07/24/2023)  Utilities: Not At Risk (07/24/2023)  Depression (PHQ2-9): High Risk (09/15/2022)  Social Connections: Unknown (11/26/2021)   Received from [facility], [facility]  Tobacco Use: High Risk (07/23/2023)     Readmission Risk Interventions     No data to display

## 2023-07-27 NOTE — Progress Notes (Addendum)
Pt receives out-pt HD at [facility] on MWF (Sun, Tues, Fri this week due to holiday schedule). Will assist as needed.   Olivia Canter Renal Navigator [phone]  Addendum at 12:01 pm: Advised by attending of pt's d/c today. Contacted [facility] charge RN to be advised that pt will d/c today and should resume tomorrow on holiday schedule. Asked pt's RN to please remind pt of HD tomorrow at clinic due to clinic being closed on Wed for holiday. Added HD appt for tomorrow to AVS as well.

## 2023-07-27 NOTE — Procedures (Signed)
I was present at this dialysis session. I have reviewed the session itself and made appropriate changes.  She does not want 3 liter goal but did agree to 2.5kg.  She is 2.6 kg above edw.   Vital signs in last 24 hours:  Temp:  [98.1 F (36.7 C)-98.6 F (37 C)] 98.4 F (36.9 C) (12/30 0445) Pulse Rate:  [82-86] 84 (12/30 0445) Resp:  [14-22] 14 (12/30 0445) BP: (114-151)/(86-96) 151/96 (12/30 0445) SpO2:  [95 %] 95 % (12/30 0445) Weight:  [33.6 kg] 33.6 kg (12/30 0445) Weight change: -4.588 kg Filed Weights   07/25/23 0615 07/26/23 0335 07/27/23 0445  Weight: 35.4 kg 38.2 kg 33.6 kg    Recent Labs  Lab 07/27/23 0346  NA 134*  K 4.0  CL 98  CO2 20*  GLUCOSE 78  BUN 50*  CREATININE 8.35*  CALCIUM 8.7*    Recent Labs  Lab 07/23/23 2000 07/24/23 0800 07/26/23 0311 07/27/23 0346  WBC 7.6 6.4 7.5 7.5  NEUTROABS 5.8  --  5.2 5.0  HGB 10.7* 9.8* 10.9* 11.1*  HCT 35.4* 32.8* 36.0 35.9*  MCV 105.0* 106.1* 105.0* 103.2*  PLT 128* 71* 126* 117*    Scheduled Meds:  ferric citrate  420 mg Oral TID WC   guaiFENesin  600 mg Oral BID   heparin injection (subcutaneous)  5,000 Units Subcutaneous Q8H   hydrALAZINE  25 mg Oral Daily   metoprolol succinate  100 mg Oral Daily   pantoprazole  40 mg Oral Daily   Continuous Infusions:  piperacillin-tazobactam (ZOSYN)  IV 2.25 g (07/27/23 0618)   PRN Meds:.acetaminophen **OR** acetaminophen, albuterol, benzonatate, guaiFENesin-dextromethorphan, loperamide, ondansetron **OR** ondansetron (ZOFRAN) IV    OP dialysis prescription: [facility], MWF,(except holiday schedule Sunday Tuesday Friday starting 12/29) 180 dialyzer, 3 hours 30 minutes, BFR 300/DFR 500, EDW 31 kg, 2.0 potassium/2.0 calcium, UF profile #2, no heparin, left BCF, Mircera 50 mcg every 4 weeks, calcitriol 1.25 mcg 3 times weekly  Irena Cords,  MD 07/27/2023, 9:06 AM

## 2023-07-28 ENCOUNTER — Ambulatory Visit (HOSPITAL_COMMUNITY)
Admission: RE | Admit: 2023-07-28 | Discharge: 2023-07-28 | Disposition: A | Discharge status: Home / Self Care | Payer: Medicare Other | Source: Ambulatory Visit | Attending: Cardiovascular Disease | Admitting: Cardiovascular Disease

## 2023-07-28 ENCOUNTER — Ambulatory Visit: Payer: Self-pay | Admitting: Family Medicine

## 2023-07-28 ENCOUNTER — Telehealth: Payer: Self-pay

## 2023-07-28 ENCOUNTER — Inpatient Hospital Stay (HOSPITAL_COMMUNITY): Payer: Medicare Other

## 2023-07-28 DIAGNOSIS — N2581 Secondary hyperparathyroidism of renal origin: Secondary | ICD-10-CM | POA: Diagnosis not present

## 2023-07-28 DIAGNOSIS — J189 Pneumonia, unspecified organism: Secondary | ICD-10-CM

## 2023-07-28 DIAGNOSIS — N186 End stage renal disease: Secondary | ICD-10-CM | POA: Diagnosis not present

## 2023-07-28 DIAGNOSIS — E875 Hyperkalemia: Secondary | ICD-10-CM | POA: Diagnosis not present

## 2023-07-28 DIAGNOSIS — Z992 Dependence on renal dialysis: Secondary | ICD-10-CM | POA: Diagnosis not present

## 2023-07-28 MED ORDER — ALBUTEROL SULFATE (2.5 MG/3ML) 0.083% IN NEBU: 2.5000 mg | INHALATION_SOLUTION | Freq: Four times a day (QID) | RESPIRATORY_TRACT | 1 refills | Status: AC | PRN

## 2023-07-28 NOTE — Telephone Encounter (Signed)
 Copied from CRM [id number]. Topic: Clinical - Medication Question >> Jul 28, 2023  3:21 PM Karel PARAS wrote: Reason for CRM: Maranda (boyfriend of pt) is calling in about pt being admitted to the hospital 2 times in one week. Pt was giving breathing treatments at ER and Maranda is wondering if Provider can prescribed those breathing treatments to pt. Maranda would like to speak with nurse - [phone]

## 2023-07-28 NOTE — Telephone Encounter (Signed)
 Call from pt's boyfriend, [name], who is requesting an Rx for the patient for a breathing treatments.  Discharged from hospital with pneumonia with an abx and an inhaler but the inhaler doesn't seem to be enough. Mr. Haynes asks if a nebulizer and medication can be sent to [facility]?

## 2023-07-28 NOTE — Telephone Encounter (Signed)
  Additional Notes: Pt was disconnected as agent joined us .  RN called back and spoke with boyfriend of pt. He stated that they hung up because Dr Clara  nurse called and they are handling this on the PCP side. RN advised them to back if we can be of further assistance. Boyfriend verbalized understanding.               Copied from CRM [id number]. Topic: Clinical - Red Word Triage >> Jul 28, 2023  4:49 PM Renea ORN wrote: SOB Answer Assessment - Initial Assessment Questions 1. REASON FOR CALL or QUESTION: What is your reason for calling today? or How can I best help you? or What question do you have that I can help answer?     Unsure. Triaged handled by PCP office.  Protocols used: Information Only Call - No Triage-A-AH

## 2023-07-29 DIAGNOSIS — I120 Hypertensive chronic kidney disease with stage 5 chronic kidney disease or end stage renal disease: Secondary | ICD-10-CM | POA: Diagnosis not present

## 2023-07-29 DIAGNOSIS — N186 End stage renal disease: Secondary | ICD-10-CM | POA: Diagnosis not present

## 2023-07-29 DIAGNOSIS — Z992 Dependence on renal dialysis: Secondary | ICD-10-CM | POA: Diagnosis not present

## 2023-07-31 DIAGNOSIS — Z992 Dependence on renal dialysis: Secondary | ICD-10-CM | POA: Diagnosis not present

## 2023-07-31 DIAGNOSIS — N2581 Secondary hyperparathyroidism of renal origin: Secondary | ICD-10-CM | POA: Diagnosis not present

## 2023-07-31 DIAGNOSIS — D631 Anemia in chronic kidney disease: Secondary | ICD-10-CM | POA: Diagnosis not present

## 2023-07-31 DIAGNOSIS — N186 End stage renal disease: Secondary | ICD-10-CM | POA: Diagnosis not present

## 2023-08-03 DIAGNOSIS — D631 Anemia in chronic kidney disease: Secondary | ICD-10-CM | POA: Diagnosis not present

## 2023-08-03 DIAGNOSIS — N186 End stage renal disease: Secondary | ICD-10-CM | POA: Diagnosis not present

## 2023-08-03 DIAGNOSIS — Z992 Dependence on renal dialysis: Secondary | ICD-10-CM | POA: Diagnosis not present

## 2023-08-03 DIAGNOSIS — N2581 Secondary hyperparathyroidism of renal origin: Secondary | ICD-10-CM | POA: Diagnosis not present

## 2023-08-05 DIAGNOSIS — D631 Anemia in chronic kidney disease: Secondary | ICD-10-CM | POA: Diagnosis not present

## 2023-08-05 DIAGNOSIS — Z992 Dependence on renal dialysis: Secondary | ICD-10-CM | POA: Diagnosis not present

## 2023-08-05 DIAGNOSIS — N2581 Secondary hyperparathyroidism of renal origin: Secondary | ICD-10-CM | POA: Diagnosis not present

## 2023-08-05 DIAGNOSIS — N186 End stage renal disease: Secondary | ICD-10-CM | POA: Diagnosis not present

## 2023-08-07 DIAGNOSIS — Z992 Dependence on renal dialysis: Secondary | ICD-10-CM | POA: Diagnosis not present

## 2023-08-07 DIAGNOSIS — N186 End stage renal disease: Secondary | ICD-10-CM | POA: Diagnosis not present

## 2023-08-07 DIAGNOSIS — D631 Anemia in chronic kidney disease: Secondary | ICD-10-CM | POA: Diagnosis not present

## 2023-08-07 DIAGNOSIS — N2581 Secondary hyperparathyroidism of renal origin: Secondary | ICD-10-CM | POA: Diagnosis not present

## 2023-08-10 DIAGNOSIS — N186 End stage renal disease: Secondary | ICD-10-CM | POA: Diagnosis not present

## 2023-08-10 DIAGNOSIS — Z992 Dependence on renal dialysis: Secondary | ICD-10-CM | POA: Diagnosis not present

## 2023-08-10 DIAGNOSIS — D631 Anemia in chronic kidney disease: Secondary | ICD-10-CM | POA: Diagnosis not present

## 2023-08-10 DIAGNOSIS — N2581 Secondary hyperparathyroidism of renal origin: Secondary | ICD-10-CM | POA: Diagnosis not present

## 2023-08-12 DIAGNOSIS — D631 Anemia in chronic kidney disease: Secondary | ICD-10-CM | POA: Diagnosis not present

## 2023-08-12 DIAGNOSIS — Z992 Dependence on renal dialysis: Secondary | ICD-10-CM | POA: Diagnosis not present

## 2023-08-12 DIAGNOSIS — N186 End stage renal disease: Secondary | ICD-10-CM | POA: Diagnosis not present

## 2023-08-12 DIAGNOSIS — N2581 Secondary hyperparathyroidism of renal origin: Secondary | ICD-10-CM | POA: Diagnosis not present

## 2023-08-14 DIAGNOSIS — Z992 Dependence on renal dialysis: Secondary | ICD-10-CM | POA: Diagnosis not present

## 2023-08-14 DIAGNOSIS — N186 End stage renal disease: Secondary | ICD-10-CM | POA: Diagnosis not present

## 2023-08-14 DIAGNOSIS — D631 Anemia in chronic kidney disease: Secondary | ICD-10-CM | POA: Diagnosis not present

## 2023-08-14 DIAGNOSIS — N2581 Secondary hyperparathyroidism of renal origin: Secondary | ICD-10-CM | POA: Diagnosis not present

## 2023-08-16 ENCOUNTER — Emergency Department (HOSPITAL_BASED_OUTPATIENT_CLINIC_OR_DEPARTMENT_OTHER)
Admission: EM | Admit: 2023-08-16 | Discharge: 2023-08-16 | Disposition: A | Discharge status: Home / Self Care | Payer: Medicare Other | Attending: Emergency Medicine | Admitting: Emergency Medicine

## 2023-08-16 ENCOUNTER — Encounter (HOSPITAL_BASED_OUTPATIENT_CLINIC_OR_DEPARTMENT_OTHER): Payer: Self-pay | Admitting: Emergency Medicine

## 2023-08-16 DIAGNOSIS — Z91010 Allergy to peanuts: Secondary | ICD-10-CM | POA: Insufficient documentation

## 2023-08-16 DIAGNOSIS — R040 Epistaxis: Secondary | ICD-10-CM | POA: Insufficient documentation

## 2023-08-16 DIAGNOSIS — Z992 Dependence on renal dialysis: Secondary | ICD-10-CM | POA: Insufficient documentation

## 2023-08-16 DIAGNOSIS — I509 Heart failure, unspecified: Secondary | ICD-10-CM | POA: Diagnosis not present

## 2023-08-16 DIAGNOSIS — Z79899 Other long term (current) drug therapy: Secondary | ICD-10-CM | POA: Insufficient documentation

## 2023-08-16 DIAGNOSIS — Z7401 Bed confinement status: Secondary | ICD-10-CM | POA: Diagnosis not present

## 2023-08-16 DIAGNOSIS — Z91040 Latex allergy status: Secondary | ICD-10-CM | POA: Diagnosis not present

## 2023-08-16 DIAGNOSIS — N186 End stage renal disease: Secondary | ICD-10-CM | POA: Insufficient documentation

## 2023-08-16 DIAGNOSIS — I132 Hypertensive heart and chronic kidney disease with heart failure and with stage 5 chronic kidney disease, or end stage renal disease: Secondary | ICD-10-CM | POA: Insufficient documentation

## 2023-08-16 DIAGNOSIS — R42 Dizziness and giddiness: Secondary | ICD-10-CM | POA: Diagnosis not present

## 2023-08-16 MED ORDER — TRANEXAMIC ACID 1000 MG/10ML IV SOLN
500.0000 mg | Freq: Once | INTRAVENOUS | Status: AC
  Administered 2023-08-16: 500 mg via TOPICAL
  Filled 2023-08-16: qty 10

## 2023-08-16 NOTE — ED Notes (Signed)
Pt departing with PTAR at this time.

## 2023-08-16 NOTE — ED Notes (Signed)
AVS provided by edp was reviewed with pt. Pt verbalized understanding with no additional questions at this time. Pt requested assistance on discharge. Secretary to call PTAR for transport home

## 2023-08-16 NOTE — ED Triage Notes (Signed)
Ems from home Nose bleed Had afrin with ems.  Has gauze and mask in place Per EMS about 1 pt blood loss. 147/80 100 18 93%RA Denies blood thinner. Hx of similar Denies trauma Started 2 hours ago

## 2023-08-16 NOTE — Discharge Instructions (Signed)
You were seen for your nosebleed in the emergency department.   If you experience another nosebleed please blow your nose, use the Afrin (3 sprays in your nostril), and hold constant pressure for 15 minutes.  Do not stop to check if the bleeding is stopped or for any other reason.    Check your MyChart online for the results of any tests that had not resulted by the time you left the emergency department.   Follow-up with your primary doctor in 2-3 days regarding your visit.    Return immediately to the emergency department if you experience any of the following: Recurrent nosebleeding that does not respond to pressure for 15 minutes and Afrin, difficulty breathing, or any other concerning symptoms.    Thank you for visiting our Emergency Department. It was a pleasure taking care of you today.

## 2023-08-16 NOTE — ED Provider Notes (Signed)
[place] EMERGENCY DEPARTMENT AT [facility] Provider Note   CSN: [id number] Arrival date & time: 08/16/23  1905     History {Add pertinent medical, surgical, social history, OB history to HPI:1} Chief Complaint  Patient presents with   Epistaxis    [name] is a 33 y.o. female.  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  2 hours prior to arrival patient started having a nosebleed out of her right nare.  Says that it persisted and so she called 911.  They gave her Afrin and held pressure and she feels like it is slowing.  Had this happen 1 time before.  No trauma to her nose.  No history of head or neck cancer.  Has been compliant with her dialysis.       Home Medications Prior to Admission medications   Medication Sig Start Date End Date Taking? Authorizing Provider  acetaminophen (TYLENOL) 500 MG tablet Take 1,000 mg by mouth every 6 (six) hours as needed for mild pain or headache.    [provider]  albuterol (PROVENTIL) (2.5 MG/3ML) 0.083% nebulizer solution Take 3 mLs (2.5 mg total) by nebulization every 6 (six) hours as needed for wheezing or shortness of breath. 07/28/23   Donita Brooks, MD  albuterol (VENTOLIN HFA) 108 (90 Base) MCG/ACT inhaler Inhale 1-2 puffs into the lungs every 6 (six) hours as needed for wheezing or shortness of breath. 09/15/22   Park Meo, FNP  AURYXIA 1 GM 210 MG(Fe) tablet Take 420 mg by mouth 2 (two) times daily with a meal. 04/15/18   [provider]  B Complex-C-Folic Acid (DIALYVITE TABLET) TABS Take 1 tablet by mouth daily.    [provider]  diphenhydrAMINE (BENADRYL) 25 mg capsule Take 25 mg by mouth daily as needed for allergies.    [provider]  EPINEPHrine 0.3 mg/0.3 mL IJ SOAJ injection Inject 0.3 mg into the muscle as  needed for anaphylaxis. 04/23/23   Park Meo, FNP  hydrALAZINE (APRESOLINE) 25 MG tablet Take 25 mg by mouth daily. 04/28/20   [provider]  metoprolol succinate (TOPROL-XL) 100 MG 24 hr tablet Take 100 mg by mouth daily. 12/29/19   [provider]  omeprazole (PRILOSEC) 40 MG capsule Take 40 mg by mouth in the morning and at bedtime. 01/08/22   [provider]  sodium zirconium cyclosilicate (LOKELMA) 5 g packet Take 10 g by mouth every Monday, Wednesday, and Friday. 07/20/21   [provider]      Allergies    Ciprofloxacin; Other; Peanut-containing drug products; Aleve [naproxen sodium]; Ceftriaxone; Coconut (cocos nucifera); Influenza vaccines; Tetanus toxoid, adsorbed; Tetanus toxoids; and Latex    Review of Systems   Review of Systems  Physical Exam Updated Vital Signs BP 119/87   Pulse (!) 105   Temp 97.9 F (36.6 C) (Oral)   Resp (!) 24   SpO2 94%  Physical Exam  ED Results / Procedures / Treatments   Labs (all labs ordered are listed, but only abnormal results are displayed) Labs Reviewed - No data to display  EKG None  Radiology No results found.  Procedures Epistaxis Management  Date/Time: 08/16/2023 7:34 PM  Performed by: Rondel Baton, MD Authorized by: Rondel Baton, MD   Consent:    Consent obtained:  Verbal   Consent given by:  Patient  Risks discussed:  Bleeding Universal protocol:    Patient identity confirmed:  Verbally with patient Anesthesia:    Anesthesia method:  None Procedure details:    Treatment site:  R anterior and L anterior   Repair method: TXA.   Treatment complexity:  Limited   Treatment episode: initial   Post-procedure details:    Assessment:  Bleeding decreased   Procedure completion:  Tolerated well, no immediate complications   {Document cardiac monitor, telemetry assessment procedure when appropriate:1}  Medications Ordered in ED Medications - No data to display  ED  Course/ Medical Decision Making/ A&P   {   Click here for ABCD2, HEART and other calculatorsREFRESH Note before signing :1}                              Medical Decision Making Risk Prescription drug management.   ***  {Document critical care time when appropriate:1} {Document review of labs and clinical decision tools ie heart score, Chads2Vasc2 etc:1}  {Document your independent review of radiology images, and any outside records:1} {Document your discussion with family members, caretakers, and with consultants:1} {Document social determinants of health affecting pt's care:1} {Document your decision making why or why not admission, treatments were needed:1} Final Clinical Impression(s) / ED Diagnoses Final diagnoses:  None    Rx / DC Orders ED Discharge Orders     None

## 2023-08-17 DIAGNOSIS — Z992 Dependence on renal dialysis: Secondary | ICD-10-CM | POA: Diagnosis not present

## 2023-08-17 DIAGNOSIS — D631 Anemia in chronic kidney disease: Secondary | ICD-10-CM | POA: Diagnosis not present

## 2023-08-17 DIAGNOSIS — N186 End stage renal disease: Secondary | ICD-10-CM | POA: Diagnosis not present

## 2023-08-17 DIAGNOSIS — N2581 Secondary hyperparathyroidism of renal origin: Secondary | ICD-10-CM | POA: Diagnosis not present

## 2023-08-19 DIAGNOSIS — D631 Anemia in chronic kidney disease: Secondary | ICD-10-CM | POA: Diagnosis not present

## 2023-08-19 DIAGNOSIS — N2581 Secondary hyperparathyroidism of renal origin: Secondary | ICD-10-CM | POA: Diagnosis not present

## 2023-08-19 DIAGNOSIS — Z992 Dependence on renal dialysis: Secondary | ICD-10-CM | POA: Diagnosis not present

## 2023-08-19 DIAGNOSIS — N186 End stage renal disease: Secondary | ICD-10-CM | POA: Diagnosis not present

## 2023-08-20 ENCOUNTER — Encounter: Payer: Self-pay | Admitting: Family Medicine

## 2023-08-20 ENCOUNTER — Other Ambulatory Visit: Payer: Self-pay

## 2023-08-20 ENCOUNTER — Ambulatory Visit: Payer: Medicare Other | Admitting: Family Medicine

## 2023-08-20 VITALS — BP 115/68 | HR 89 | Temp 98.4°F | Ht <= 58 in

## 2023-08-20 DIAGNOSIS — I509 Heart failure, unspecified: Secondary | ICD-10-CM

## 2023-08-20 DIAGNOSIS — F339 Major depressive disorder, recurrent, unspecified: Secondary | ICD-10-CM | POA: Diagnosis not present

## 2023-08-20 DIAGNOSIS — N186 End stage renal disease: Secondary | ICD-10-CM | POA: Diagnosis not present

## 2023-08-20 NOTE — Progress Notes (Signed)
Subjective:  HPI: [name] is a 33 y.o. female presenting on 08/20/2023 for Follow-up ( Sleep apnea...heart failure/)   HPI Patient is in today for follow up regarding sleep apnea testing and recent diagnosis of heart failure. She has a sleep study scheduled for March 25 with [facility]. In addition, she is seeing cardiology for her newly diagnosed heart failure. Ms Kangas has had several new diagnoses recently, she is very quiet during her visit today without much to say. She reports she is overwhelmed by her health problems and prefers to "just sleep".   Cardiology H&P 06/16/2023 for reference only: In 12/23, she had an echo showing EF 70-75%, moderate concentric LVH, mild RV enlargement with low normal RV systolic function, D-shaped septum suggestive of RV pressure/volume overload, PASP 85 mmHg, severe biatrial enlargement, abnormal MV with mild MR and moderate-severe mitral stenosis mean gradient 9 mmHg, mod-severe TR, narrow LV outflow tract with peak gradient 14 mmHg. She saw Dr. Jacques Navy with concern for significant mitral stenosis and RV failure. Of note, she reports no known history of rheumatic fever as a child.   Plan: 1.Chronic diastolic CHF/RV failure: Echo (12/23) showed EF 70-75%, moderate concentric LVH, mild RV enlargement with low normal RV systolic function, D-shaped septum suggestive of RV pressure/volume overload, PASP 85 mmHg, severe biatrial enlargement, abnormal MV with mild MR and moderate-severe mitral stenosis mean gradient 9 mmHg, mod-severe TR, narrow LV outflow tract with peak gradient 14 mmHg.  It is possible that moderate-severe mitral stenosis is the main trigger for pulmonary hypertension  and RV failure.  She is volume overloaded on exam with probably NYHA class III symptoms.  - She needs more fluid off at HD, but has not been able to successfully lower dry weight due to cramping and nausea with HD.  I would consider increasing her to 4 days/week of  dialysis.  - We need to investigate her mitral stenosis further, is this the main driver for her CHF? 2. 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  I do not think that she would be a candidate for surgical mitral valve replacement.  She could be a valvuloplasty candidate if valve has the right morphology.  - Repeat echo to reassess mitral valve.  - I will arrange for hemodynamic RHC/LHC to assess filling pressures, PA pressure, and mitral stenosis severity. Discussed risks/benefits and she agrees to procedure.  3. Suspect OSA: Gasps in sleep, snores.   - I will arrange for sleep study.    Review of Systems  All other systems reviewed and are negative.   Relevant past medical history reviewed and updated as indicated.   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   Depression 05/14/2015   Dialysis care    ESRD (end stage renal disease) on dialysis [facility])    GERD (gastroesophageal reflux disease) 01/07/2017   HTN (hypertension) 05/02/2011   Murmur, cardiac 07/17/2022   Pneumonia, unspecified organism 07/27/2023   Spina bifida    UTI (lower urinary tract infection)    Viral URI with cough 09/15/2022     Past Surgical History:  Procedure Laterality Date   A/V FISTULAGRAM Left 10/14/2021   Procedure: A/V Fistulagram;  Surgeon: Maeola Harman, MD;  Location: [facility] INVASIVE CV LAB;  Service: Cardiovascular;  Laterality: Left;  AV FISTULA PLACEMENT     left arm   INSERTION OF DIALYSIS CATHETER N/A 05/08/2020   Procedure: ATTEMPTED, UNSUCCESSFUL INSERTION OF DIALYSIS CATHETER TUNNELED RIGHT INTERNAL JUGULAR;  Surgeon: Lucretia Roers, MD;  Location: [facility] ORS;  Service: General;  Laterality: N/A;   IR FLUORO GUIDE CV LINE RIGHT  05/09/2020   IR US GUIDE VASC ACCESS RIGHT   05/09/2020   RIGHT HEART CATH N/A 06/30/2023   Procedure: RIGHT HEART CATH;  Surgeon: Laurey Morale, MD;  Location: [facility] INVASIVE CV LAB;  Service: Cardiovascular;  Laterality: N/A;    Allergies and medications reviewed and updated.   Current Outpatient Medications:    acetaminophen (TYLENOL) 500 MG tablet, Take 1,000 mg by mouth every 6 (six) hours as needed for mild pain or headache., Disp: , Rfl:    albuterol (PROVENTIL) (2.5 MG/3ML) 0.083% nebulizer solution, Take 3 mLs (2.5 mg total) by nebulization every 6 (six) hours as needed for wheezing or shortness of breath., Disp: 150 mL, Rfl: 1   albuterol (VENTOLIN HFA) 108 (90 Base) MCG/ACT inhaler, Inhale 1-2 puffs into the lungs every 6 (six) hours as needed for wheezing or shortness of breath., Disp: 1 each, Rfl: 0   AURYXIA 1 GM 210 MG(Fe) tablet, Take 420 mg by mouth 2 (two) times daily with a meal., Disp: , Rfl: 3   B Complex-C-Folic Acid (DIALYVITE TABLET) TABS, Take 1 tablet by mouth daily., Disp: , Rfl:    diphenhydrAMINE (BENADRYL) 25 mg capsule, Take 25 mg by mouth daily as needed for allergies., Disp: , Rfl:    EPINEPHrine 0.3 mg/0.3 mL IJ SOAJ injection, Inject 0.3 mg into the muscle as needed for anaphylaxis., Disp: 2 each, Rfl: 1   hydrALAZINE (APRESOLINE) 25 MG tablet, Take 25 mg by mouth daily., Disp: , Rfl:    metoprolol succinate (TOPROL-XL) 100 MG 24 hr tablet, Take 100 mg by mouth daily., Disp: , Rfl:    omeprazole (PRILOSEC) 40 MG capsule, Take 40 mg by mouth in the morning and at bedtime., Disp: , Rfl:    sodium zirconium cyclosilicate (LOKELMA) 5 g packet, Take 10 g by mouth every Monday, Wednesday, and Friday., Disp: , Rfl:   Allergies  Allergen Reactions   Ciprofloxacin Shortness Of Breath, Nausea And Vomiting and Other (See Comments)    HIGH FEVER and oral blisters    Other Anaphylaxis    Revaclear dialzer   Peanut-Containing Drug Products Anaphylaxis   Aleve [Naproxen Sodium] Other (See Comments)    G.I.Bleed    Ceftriaxone Other (See Comments)    Blisters in mouth    Coconut (Cocos Nucifera) Hives   Influenza Vaccines Nausea And Vomiting and Other (See Comments)    High fever   Tetanus Toxoid, Adsorbed Nausea And Vomiting and Other (See Comments)    HIGH FEVER, also   Tetanus Toxoids Nausea And Vomiting and Other (See Comments)    HIGH FEVER   Latex Itching and Rash    Objective:   BP 115/68   Pulse 89   Temp 98.4 F (36.9 C) (Oral)   Ht 3' (0.914 m)   SpO2 96%   BMI 43.06 kg/m      08/20/2023    3:52 PM 08/16/2023   10:04 PM 08/16/2023    7:13 PM  Vitals with BMI  Height 3\' 0"     Systolic 115 145 829  Diastolic 68 91 87  Pulse 89 100 105     Physical Exam Vitals and nursing note reviewed.  Constitutional:      Appearance: Normal appearance. She is normal weight.  HENT:     Head: Normocephalic and atraumatic.  Skin:    General: Skin is warm and dry.  Neurological:     General: No focal deficit present.     Mental Status: She is alert and oriented to person, place, and time. Mental status is at baseline.  Psychiatric:        Mood and Affect: Mood normal.        Behavior: Behavior normal.        Thought Content: Thought content normal.        Judgment: Judgment normal.     Assessment & Plan:  Depression, recurrent (HCC) Assessment & Plan: Chronic uncontrolled. Encouraged to talk to a therapist but she declines. She also declines medication. I encouraged her to spend time doing things she enjoys, she enjoys working puzzles. Seek medical care if symptoms worsen or if you are willing to speak to a therapist or try a medication. Denies SI/HI. Seek care immediately for suicidal thoughts by calling [phone] or proceeding to nearest emergency room.   ESRD (end stage renal disease) (HCC) Assessment & Plan: Continue dialysis MWF and keep appts with Nephrology. Request medical records for review as she declines labs today.   Congestive heart failure, unspecified HF  chronicity, unspecified heart failure type [facility]) Assessment & Plan: Followed by Cardiology who recommended more fluid off at HD. Ordered repeat ECHO and heart cath as well as sleep studies. Next visit in 5 days, I stressed the appt of her keeping this appt.       Follow up plan: Return in about 4 weeks (around 09/17/2023) for anxiety/depression.  Park Meo, FNP

## 2023-08-20 NOTE — Assessment & Plan Note (Addendum)
Chronic uncontrolled. Encouraged to talk to a therapist but she declines. She also declines medication. I encouraged her to spend time doing things she enjoys, she enjoys working puzzles. Seek medical care if symptoms worsen or if you are willing to speak to a therapist or try a medication. Denies SI/HI. Seek care immediately for suicidal thoughts by calling [phone] or proceeding to nearest emergency room.

## 2023-08-20 NOTE — Assessment & Plan Note (Signed)
Continue dialysis MWF and keep appts with Nephrology. Request medical records for review as she declines labs today.

## 2023-08-20 NOTE — Assessment & Plan Note (Signed)
Followed by Cardiology who recommended more fluid off at HD. Ordered repeat ECHO and heart cath as well as sleep studies. Next visit in 5 days, I stressed the appt of her keeping this appt.

## 2023-08-20 NOTE — Assessment & Plan Note (Signed)
>>  ASSESSMENT AND PLAN FOR ESRD (END STAGE RENAL DISEASE) (HCC) WRITTEN ON 08/20/2023  4:42 PM BY HOWARD, AMBER S, FNP  Continue dialysis MWF and keep appts with Nephrology. Request medical records for review as she declines labs today.

## 2023-08-21 ENCOUNTER — Other Ambulatory Visit: Payer: Self-pay

## 2023-08-21 DIAGNOSIS — D631 Anemia in chronic kidney disease: Secondary | ICD-10-CM | POA: Diagnosis not present

## 2023-08-21 DIAGNOSIS — N2581 Secondary hyperparathyroidism of renal origin: Secondary | ICD-10-CM | POA: Diagnosis not present

## 2023-08-21 DIAGNOSIS — Z992 Dependence on renal dialysis: Secondary | ICD-10-CM | POA: Diagnosis not present

## 2023-08-21 DIAGNOSIS — N186 End stage renal disease: Secondary | ICD-10-CM | POA: Diagnosis not present

## 2023-08-24 DIAGNOSIS — Z992 Dependence on renal dialysis: Secondary | ICD-10-CM | POA: Diagnosis not present

## 2023-08-24 DIAGNOSIS — N186 End stage renal disease: Secondary | ICD-10-CM | POA: Diagnosis not present

## 2023-08-24 DIAGNOSIS — D631 Anemia in chronic kidney disease: Secondary | ICD-10-CM | POA: Diagnosis not present

## 2023-08-24 DIAGNOSIS — N2581 Secondary hyperparathyroidism of renal origin: Secondary | ICD-10-CM | POA: Diagnosis not present

## 2023-08-25 ENCOUNTER — Ambulatory Visit (HOSPITAL_COMMUNITY)
Admission: RE | Admit: 2023-08-25 | Discharge: 2023-08-25 | Disposition: A | Discharge status: Home / Self Care | Payer: Medicare Other | Source: Ambulatory Visit | Attending: Cardiology | Admitting: Cardiology

## 2023-08-25 VITALS — BP 109/80 | HR 95

## 2023-08-25 DIAGNOSIS — I2721 Secondary pulmonary arterial hypertension: Secondary | ICD-10-CM | POA: Diagnosis not present

## 2023-08-25 DIAGNOSIS — Z993 Dependence on wheelchair: Secondary | ICD-10-CM | POA: Diagnosis not present

## 2023-08-25 DIAGNOSIS — R0683 Snoring: Secondary | ICD-10-CM | POA: Insufficient documentation

## 2023-08-25 DIAGNOSIS — Z8249 Family history of ischemic heart disease and other diseases of the circulatory system: Secondary | ICD-10-CM | POA: Diagnosis not present

## 2023-08-25 DIAGNOSIS — I5032 Chronic diastolic (congestive) heart failure: Secondary | ICD-10-CM | POA: Diagnosis not present

## 2023-08-25 DIAGNOSIS — Z992 Dependence on renal dialysis: Secondary | ICD-10-CM | POA: Diagnosis not present

## 2023-08-25 DIAGNOSIS — N186 End stage renal disease: Secondary | ICD-10-CM | POA: Insufficient documentation

## 2023-08-25 DIAGNOSIS — I5022 Chronic systolic (congestive) heart failure: Secondary | ICD-10-CM

## 2023-08-25 DIAGNOSIS — I132 Hypertensive heart and chronic kidney disease with heart failure and with stage 5 chronic kidney disease, or end stage renal disease: Secondary | ICD-10-CM | POA: Insufficient documentation

## 2023-08-25 DIAGNOSIS — I081 Rheumatic disorders of both mitral and tricuspid valves: Secondary | ICD-10-CM | POA: Insufficient documentation

## 2023-08-25 DIAGNOSIS — Q059 Spina bifida, unspecified: Secondary | ICD-10-CM | POA: Diagnosis not present

## 2023-08-25 NOTE — Progress Notes (Signed)
Height:     Weight: BMI:  Today's Date:  STOP BANG RISK ASSESSMENT S (snore) Have you been told that you snore?     YES   T (tired) Are you often tired, fatigued, or sleepy during the day?   YES  O (obstruction) Do you stop breathing, choke, or gasp during sleep? YES   P (pressure) Do you have or are you being treated for high blood pressure? YES   B (BMI) Is your body index greater than 35 kg/m? NO   A (age) Are you 31 years old or older? NO   N (neck) Do you have a neck circumference greater than 16 inches?   NO   G (gender) Are you a female? NO   TOTAL STOP/BANG "YES" ANSWERS 4                                                                       For Office Use Only              Procedure Order Form    YES to 3+ Stop Bang questions OR two clinical symptoms - patient qualifies for WatchPAT (CPT 95800)      Clinical Notes: Will consult Sleep Specialist and refer for management of therapy due to patient increased risk of Sleep Apnea. Ordering a sleep study due to the following two clinical symptoms: Excessive daytime sleepiness G47.10 / Gastroesophageal reflux K21.9 /Morning Headaches G44.221 / Difficulty concentrating R41.840 /  Personality changes or irritability R45.4 / Loud snoring R06.83 / Depression F32.9 / Unrefreshed by sleep G47.8 / History of high blood pressure R03.0 / Insomnia G47.00

## 2023-08-25 NOTE — Patient Instructions (Signed)
There has been no changes.  Your provider has recommended that you have a home sleep study (Itamar Test).  We have provided you with the equipment in our office today. Please go ahead and download the app. DO NOT OPEN OR TAMPER WITH THE BOX UNTIL WE ADVISE YOU TO DO SO. Once insurance has approved the test our office will call you with PIN number and approval to proceed with testing. Once you have completed the test you just dispose of the equipment, the information is automatically uploaded to [place] via blue-tooth technology. If your test is positive for sleep apnea and you need a home CPAP machine you will be contacted by Dr Norris Cross office [facility]) to set this up.  Your physician recommends that you schedule a follow-up appointment in: 3 months ( April) ** PLEASE CALL THE OFFICE IN MID FEBRUARY/ EARLY MARCH TO ARRANGE YOUR FOLLOW UP APPOINTMENT.**  If you have any questions or concerns before your next appointment please send [place] a message through [place] or call our office at [phone].    TO LEAVE A MESSAGE FOR THE NURSE SELECT OPTION 2, PLEASE LEAVE A MESSAGE INCLUDING: YOUR NAME DATE OF BIRTH CALL BACK NUMBER REASON FOR CALL**this is important as we prioritize the call backs  YOU WILL RECEIVE A CALL BACK THE SAME DAY AS LONG AS YOU CALL BEFORE 4:00 PM  At the [facility], you and your health needs are our priority. As part of our continuing mission to provide you with exceptional heart care, we have created designated Provider Care Teams. These Care Teams include your primary Cardiologist (physician) and Advanced Practice Providers (APPs- Physician Assistants and Nurse Practitioners) who all work together to provide you with the care you need, when you need it.   You may see any of the following providers on your designated Care Team at your next follow up: Dr Arvilla Meres Dr Marca Ancona Dr. Dorthula Nettles Dr. Clearnce Hasten Amy Filbert Schilder, NP Robbie Lis, [place] [facility] [place], [place] Brynda Peon, NP [place] Lee, NP Karle Plumber, PharmD   Please be sure to bring in all your medications bottles to every appointment.    Thank you for choosing [facility]

## 2023-08-26 DIAGNOSIS — D631 Anemia in chronic kidney disease: Secondary | ICD-10-CM | POA: Diagnosis not present

## 2023-08-26 DIAGNOSIS — N2581 Secondary hyperparathyroidism of renal origin: Secondary | ICD-10-CM | POA: Diagnosis not present

## 2023-08-26 DIAGNOSIS — Z992 Dependence on renal dialysis: Secondary | ICD-10-CM | POA: Diagnosis not present

## 2023-08-26 DIAGNOSIS — N186 End stage renal disease: Secondary | ICD-10-CM | POA: Diagnosis not present

## 2023-08-26 NOTE — Progress Notes (Signed)
PCP: Park Meo, FNP HF Cardiology: Dr. Shirlee Latch  Chief Complaint: CHF  33 y.o. with history of ESRD, spina bifida with caudal regression syndrome, and RV failure/mitral stenosis was referred by Dr. Jacques Navy for evaluation of RV failure.  Patient is wheelchair-bound and unable to walk.  She transfers from chair to bed, etc.  She has been on dialysis since age 66.  In 12/23, she had an echo showing EF 70-75%, moderate concentric LVH, mild RV enlargement with low normal RV systolic function, D-shaped septum suggestive of RV pressure/volume overload, PASP 85 mmHg, severe biatrial enlargement, abnormal MV with mild MR and moderate-severe mitral stenosis mean gradient 9 mmHg, mod-severe TR, narrow LV outflow tract with peak gradient 14 mmHg. She saw Dr. Jacques Navy with concern for significant mitral stenosis and RV failure.  Of note, she reports no known history of rheumatic fever as a child.   She had RHC in 12/24, this showed markedly elevated right-sided filling pressures, severe primarily pulmonary arterial hypertension, and preserved cardiac output. 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    She was admitted in 12/24 with respiratory failure thought to be due to PNA + volume overload.   Patient returns for followup of CHF, pulmonary hypertension, mitral stenosis.  She tolerates HD though BP drops at times with HD. She has become gradually more short of breath with transfers from her motor scooter to bed or chair though she can still do the transfers.  She has chronic orthopnea, sleeps on 3 pillows.  No chest pain, no lightheadedness.   ECG (personally reviewed): NSR, short PR  PMH: 1. ESRD:  Since 33 years old.  2. HTN 3. Spina bifida with caudal regression syndrome 4. GERD 5. Chronic diastolic CHF with RV failure: Echo (12/23) with EF 70-75%, moderate concentric LVH, mild RV enlargement with low normal RV systolic function, D-shaped septum suggestive of RV pressure/volume overload, PASP 85 mmHg, severe biatrial enlargement, abnormal MV with mild MR and moderate-severe mitral stenosis mean gradient 9 mmHg, mod-severe TR, narrow LV outflow tract with peak gradient 14 mmHg.  - RHC (12/24): mean RA 21, PA 92/33 mean 58, mean PCWP 16, CI 3.21, PVR 16.7 WU, PAPi 2.8.  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  6. Mitral stenosis: No history of rheumatic fever.  Echo in 12/23 with abnormal MV with mild MR and moderate-severe mitral stenosis mean gradient 9 mmHg.  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 Tricuspid regurgitation: Severe/torrential.   SH: Married, lives in [place], no smoking or ETOH.   Family History  Problem Relation Age of Onset   Kidney cancer Other    Hypertension Maternal Grandmother    Arthritis Maternal Grandmother    Breast cancer Maternal Aunt    Colon cancer Neg Hx    ROS: All systems reviewed and negative except as per HPI.   Current Outpatient Medications  Medication Sig Dispense Refill   acetaminophen (  TYLENOL) 500 MG tablet Take 1,000 mg by mouth every 6 (six) hours as needed for mild pain or headache.     albuterol (PROVENTIL) (2.5 MG/3ML) 0.083% nebulizer solution Take 3 mLs (2.5 mg total) by nebulization every 6 (six) hours as needed for wheezing or shortness of breath. 150 mL 1   albuterol (VENTOLIN  HFA) 108 (90 Base) MCG/ACT inhaler Inhale 1-2 puffs into the lungs every 6 (six) hours as needed for wheezing or shortness of breath. 1 each 0   AURYXIA 1 GM 210 MG(Fe) tablet Take 420 mg by mouth 2 (two) times daily with a meal.  3   B Complex-C-Folic Acid (DIALYVITE TABLET) TABS Take 1 tablet by mouth daily.     diphenhydrAMINE (BENADRYL) 25 mg capsule Take 25 mg by mouth daily as needed for allergies.     EPINEPHrine 0.3 mg/0.3 mL IJ SOAJ injection Inject 0.3 mg into the muscle as needed for anaphylaxis. 2 each 1   metoprolol succinate (TOPROL-XL) 100 MG 24 hr tablet Take 100 mg by mouth daily.     omeprazole (PRILOSEC) 40 MG capsule Take 40 mg by mouth in the morning and at bedtime.     sodium zirconium cyclosilicate (LOKELMA) 5 g packet Take 10 g by mouth every Monday, Wednesday, and Friday.     No current facility-administered medications for this encounter.   BP 109/80   Pulse 95   SpO2 97%  General: NAD Neck: JVP difficult but appears elevated, no thyromegaly or thyroid nodule.  Lungs: Clear to auscultation bilaterally with normal respiratory effort. CV: Nondisplaced PMI.  Heart regular S1/S2, no S3/S4, 2/6 HSM LLSB.  No carotid bruit.   Abdomen: Soft, nontender, no hepatosplenomegaly, no distention.  Skin: Intact without lesions or rashes.  Neurologic: Alert and oriented x 3.  Psych: Normal affect. Extremities: No clubbing or cyanosis.  HEENT: Normal.   Assessment/Plan: 1. 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  RHC in 12/24 showed mildly elevated PCWP, severely elevated right-sided filling pressures, severe primarily pulmonary arterial hypertension.  I suspect that severe mitral  stenosis is the main trigger for pulmonary hypertension  and RV failure.  She is volume overloaded on exam, NYHA class III symptoms.  - She needs more fluid off at HD, but has not been able to successfully lower dry weight due to cramping and nausea with HD.  Increasing dialysis to 4 days a week may be a good option for her.  2. 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  She is not a candidate for surgical mitral valve replacement.  I reviewed her echo with Dr. Lynnette Caffey.  We do not think she would be a good valvuloplasty candidate with heavily calcified valve as well as the severe primarily pulmonary arterial hypertension. Unfortunately, I do not think she has viable options for valvular intervention.  3. Suspect OSA: Gasps in sleep, snores.   - I am going to set up a sleep study.  4. Tricuspid regurgitation: Severe/torrential, likely due to long-standing pulmonary hypertension.  I do not think that she has good options for surgical or percutaneous treatment of this.  5. Pulmonary hypertension: Primarily pulmonary arterial hypertension with PA 92/33, mean 58, PVR 16.7 WU on 12/24 RHC.  I suspect that she has had pulmonary vascular remodeling in the face of long-standing mitral stenosis (group 1 and group 2 PH).   - I do not think she is a good candidate for pulmonary vasodilators as the mitral valve appears to be the driver for her PH.  - I do not think that we have a viable way to address her mitral valve.   Followup in 3 months.    I spent 42 minutes reviewing chart, interacting with patient, speaking with colleagues and reviewing imaging, and managing orders.   Marca Ancona 08/26/2023

## 2023-08-28 DIAGNOSIS — N186 End stage renal disease: Secondary | ICD-10-CM | POA: Diagnosis not present

## 2023-08-28 DIAGNOSIS — D631 Anemia in chronic kidney disease: Secondary | ICD-10-CM | POA: Diagnosis not present

## 2023-08-28 DIAGNOSIS — Z992 Dependence on renal dialysis: Secondary | ICD-10-CM | POA: Diagnosis not present

## 2023-08-28 DIAGNOSIS — N2581 Secondary hyperparathyroidism of renal origin: Secondary | ICD-10-CM | POA: Diagnosis not present

## 2023-08-29 DIAGNOSIS — I120 Hypertensive chronic kidney disease with stage 5 chronic kidney disease or end stage renal disease: Secondary | ICD-10-CM | POA: Diagnosis not present

## 2023-08-29 DIAGNOSIS — N186 End stage renal disease: Secondary | ICD-10-CM | POA: Diagnosis not present

## 2023-08-29 DIAGNOSIS — Z992 Dependence on renal dialysis: Secondary | ICD-10-CM | POA: Diagnosis not present

## 2023-08-31 DIAGNOSIS — N186 End stage renal disease: Secondary | ICD-10-CM | POA: Diagnosis not present

## 2023-08-31 DIAGNOSIS — E162 Hypoglycemia, unspecified: Secondary | ICD-10-CM | POA: Diagnosis not present

## 2023-08-31 DIAGNOSIS — N2581 Secondary hyperparathyroidism of renal origin: Secondary | ICD-10-CM | POA: Diagnosis not present

## 2023-08-31 DIAGNOSIS — Z992 Dependence on renal dialysis: Secondary | ICD-10-CM | POA: Diagnosis not present

## 2023-08-31 DIAGNOSIS — D631 Anemia in chronic kidney disease: Secondary | ICD-10-CM | POA: Diagnosis not present

## 2023-09-02 ENCOUNTER — Encounter (INDEPENDENT_AMBULATORY_CARE_PROVIDER_SITE_OTHER): Payer: Medicare Other | Admitting: Cardiology

## 2023-09-02 DIAGNOSIS — Z992 Dependence on renal dialysis: Secondary | ICD-10-CM | POA: Diagnosis not present

## 2023-09-02 DIAGNOSIS — N186 End stage renal disease: Secondary | ICD-10-CM | POA: Diagnosis not present

## 2023-09-02 DIAGNOSIS — G4733 Obstructive sleep apnea (adult) (pediatric): Secondary | ICD-10-CM | POA: Diagnosis not present

## 2023-09-02 DIAGNOSIS — D631 Anemia in chronic kidney disease: Secondary | ICD-10-CM | POA: Diagnosis not present

## 2023-09-02 DIAGNOSIS — N2581 Secondary hyperparathyroidism of renal origin: Secondary | ICD-10-CM | POA: Diagnosis not present

## 2023-09-02 DIAGNOSIS — E162 Hypoglycemia, unspecified: Secondary | ICD-10-CM | POA: Diagnosis not present

## 2023-09-03 ENCOUNTER — Ambulatory Visit: Payer: Medicare Other | Attending: Cardiology

## 2023-09-03 DIAGNOSIS — I10 Essential (primary) hypertension: Secondary | ICD-10-CM

## 2023-09-03 DIAGNOSIS — G4733 Obstructive sleep apnea (adult) (pediatric): Secondary | ICD-10-CM

## 2023-09-03 DIAGNOSIS — I5022 Chronic systolic (congestive) heart failure: Secondary | ICD-10-CM

## 2023-09-03 NOTE — Procedures (Signed)
   SLEEP STUDY REPORT Patient Information Study Date: 09/03/2023 Patient Name: [name] Patient ID: [id number] Birth Date: March 17, 1991 Age: 33 Gender: Female BMI: 40.7 (W=79 lb, H=3' 1'') Stopbang: 4 Referring Physician: Ezra Shuck, MD  TEST DESCRIPTION: Home sleep apnea testing was completed using the WatchPat, a Type 1 device, utilizing peripheral arterial tonometry (PAT), chest movement, actigraphy, pulse oximetry, pulse rate, body position and snore. AHI was calculated with apnea and hypopnea using valid sleep time as the denominator. RDI includes apneas, hypopneas, and RERAs. The data acquired and the scoring of sleep and all associated events were performed in accordance with the recommended standards and specifications as outlined in the AASM Manual for the Scoring of Sleep and Associated Events 2.2.0 (2015).  FINDINGS:  1. Severe Obstructive Sleep Apnea with AHI 70.2/hr.  2. Mild Central Sleep Apnea with pAHIc 14.4/hr.  3. Oxygen  desaturations as low as 60%.  4. Severe snoring was present. O2 sats were < 88% for 238.8 min.  5. Total sleep time was 5 hrs and 46 min.  6. 25.4% of total sleep time was spent in REM sleep.  7. Shortened sleep onset latency at 5 min.  8. Normal REM sleep onset latency at 93 min.  9. Total awakenings were 16. 10. Arrhythmia detection: Suggestive of possible brief atrial fibrillation lasting 45 seconds. This is not diagnostic and further testing with outpatient telemetry monitoring is recommended.  DIAGNOSIS: Severe Obstructive Sleep Apnea (G47.33) Nocturnal Hypoxemia Possible Atrial fibrillation  RECOMMENDATIONS: 1. Clinical correlation of these findings is necessary. The decision to treat obstructive sleep apnea (OSA) is usually based on the presence of apnea symptoms or the presence of associated medical conditions such as Hypertension, Congestive Heart Failure, Atrial Fibrillation or Obesity. The most common symptoms of OSA are  snoring, gasping for breath while sleeping, daytime sleepiness and fatigue.  2. Initiating apnea therapy is recommended given the presence of symptoms and/or associated conditions. Recommend proceeding with one of the following:   a. Auto-CPAP therapy with a pressure range of 5-20cm H2O.   b. An oral appliance (OA) that can be obtained from certain dentists with expertise in sleep medicine. These are primarily of use in non-obese patients with mild and moderate disease.   c. An ENT consultation which may be useful to look for specific causes of obstruction and possible treatment options.   d. If patient is intolerant to PAP therapy, consider referral to ENT for evaluation for hypoglossal nerve stimulator.  3. Close follow-up is necessary to ensure success with CPAP or oral appliance therapy for maximum benefit .  4. A follow-up oximetry study on CPAP is recommended to assess the adequacy of therapy and determine the need for supplemental oxygen  or the potential need for Bi-level therapy. An arterial blood gas to determine the adequacy of baseline ventilation and oxygenation should also be considered.  5. Healthy sleep recommendations include: adequate nightly sleep (normal 7-9 hrs/night), avoidance of caffeine after noon and alcohol near bedtime, and maintaining a sleep environment that is cool, dark and quiet.  6. Weight loss for overweight patients is recommended. Even modest amounts of weight loss can significantly improve the severity of sleep apnea.  7. Snoring recommendations include: weight loss where appropriate, side sleeping, and avoidance of alcohol before bed.  8. Operation of motor vehicle should be avoided when sleepy.  Signature: Wilbert Bihari, MD; [facility]; Diplomat, American Board of Sleep Medicine Electronically Signed: 09/03/2023 8:25:46 PM

## 2023-09-04 DIAGNOSIS — E162 Hypoglycemia, unspecified: Secondary | ICD-10-CM | POA: Diagnosis not present

## 2023-09-04 DIAGNOSIS — Z992 Dependence on renal dialysis: Secondary | ICD-10-CM | POA: Diagnosis not present

## 2023-09-04 DIAGNOSIS — N2581 Secondary hyperparathyroidism of renal origin: Secondary | ICD-10-CM | POA: Diagnosis not present

## 2023-09-04 DIAGNOSIS — D631 Anemia in chronic kidney disease: Secondary | ICD-10-CM | POA: Diagnosis not present

## 2023-09-04 DIAGNOSIS — N186 End stage renal disease: Secondary | ICD-10-CM | POA: Diagnosis not present

## 2023-09-07 DIAGNOSIS — N186 End stage renal disease: Secondary | ICD-10-CM | POA: Diagnosis not present

## 2023-09-07 DIAGNOSIS — D631 Anemia in chronic kidney disease: Secondary | ICD-10-CM | POA: Diagnosis not present

## 2023-09-07 DIAGNOSIS — N2581 Secondary hyperparathyroidism of renal origin: Secondary | ICD-10-CM | POA: Diagnosis not present

## 2023-09-07 DIAGNOSIS — Z992 Dependence on renal dialysis: Secondary | ICD-10-CM | POA: Diagnosis not present

## 2023-09-07 DIAGNOSIS — E162 Hypoglycemia, unspecified: Secondary | ICD-10-CM | POA: Diagnosis not present

## 2023-09-09 ENCOUNTER — Telehealth: Payer: Self-pay

## 2023-09-09 DIAGNOSIS — N186 End stage renal disease: Secondary | ICD-10-CM | POA: Diagnosis not present

## 2023-09-09 DIAGNOSIS — I10 Essential (primary) hypertension: Secondary | ICD-10-CM

## 2023-09-09 DIAGNOSIS — D631 Anemia in chronic kidney disease: Secondary | ICD-10-CM | POA: Diagnosis not present

## 2023-09-09 DIAGNOSIS — I5022 Chronic systolic (congestive) heart failure: Secondary | ICD-10-CM

## 2023-09-09 DIAGNOSIS — N2581 Secondary hyperparathyroidism of renal origin: Secondary | ICD-10-CM | POA: Diagnosis not present

## 2023-09-09 DIAGNOSIS — Z992 Dependence on renal dialysis: Secondary | ICD-10-CM | POA: Diagnosis not present

## 2023-09-09 DIAGNOSIS — G4733 Obstructive sleep apnea (adult) (pediatric): Secondary | ICD-10-CM

## 2023-09-09 DIAGNOSIS — E162 Hypoglycemia, unspecified: Secondary | ICD-10-CM | POA: Diagnosis not present

## 2023-09-09 NOTE — Telephone Encounter (Signed)
-----   Message from Armanda Magic sent at 09/03/2023  8:27 PM EST ----- Please let patient know that they have sleep apnea.  Recommend therapeutic CPAP titration for treatment of patient's sleep disordered breathing.

## 2023-09-09 NOTE — Telephone Encounter (Signed)
Called patient to give sleep study results and recommendations, unable to leave VM. Will call back on 09/11/23

## 2023-09-11 DIAGNOSIS — Z992 Dependence on renal dialysis: Secondary | ICD-10-CM | POA: Diagnosis not present

## 2023-09-11 DIAGNOSIS — N186 End stage renal disease: Secondary | ICD-10-CM | POA: Diagnosis not present

## 2023-09-11 DIAGNOSIS — E162 Hypoglycemia, unspecified: Secondary | ICD-10-CM | POA: Diagnosis not present

## 2023-09-11 DIAGNOSIS — D631 Anemia in chronic kidney disease: Secondary | ICD-10-CM | POA: Diagnosis not present

## 2023-09-11 DIAGNOSIS — N2581 Secondary hyperparathyroidism of renal origin: Secondary | ICD-10-CM | POA: Diagnosis not present

## 2023-09-14 DIAGNOSIS — N186 End stage renal disease: Secondary | ICD-10-CM | POA: Diagnosis not present

## 2023-09-14 DIAGNOSIS — E162 Hypoglycemia, unspecified: Secondary | ICD-10-CM | POA: Diagnosis not present

## 2023-09-14 DIAGNOSIS — Z992 Dependence on renal dialysis: Secondary | ICD-10-CM | POA: Diagnosis not present

## 2023-09-14 DIAGNOSIS — N2581 Secondary hyperparathyroidism of renal origin: Secondary | ICD-10-CM | POA: Diagnosis not present

## 2023-09-14 DIAGNOSIS — D631 Anemia in chronic kidney disease: Secondary | ICD-10-CM | POA: Diagnosis not present

## 2023-09-15 NOTE — Addendum Note (Signed)
Addended by: Reesa Chew on: 09/15/2023 06:05 PM   Modules accepted: Orders

## 2023-09-16 DIAGNOSIS — N186 End stage renal disease: Secondary | ICD-10-CM | POA: Diagnosis not present

## 2023-09-16 DIAGNOSIS — D631 Anemia in chronic kidney disease: Secondary | ICD-10-CM | POA: Diagnosis not present

## 2023-09-16 DIAGNOSIS — Z992 Dependence on renal dialysis: Secondary | ICD-10-CM | POA: Diagnosis not present

## 2023-09-16 DIAGNOSIS — N2581 Secondary hyperparathyroidism of renal origin: Secondary | ICD-10-CM | POA: Diagnosis not present

## 2023-09-16 DIAGNOSIS — E162 Hypoglycemia, unspecified: Secondary | ICD-10-CM | POA: Diagnosis not present

## 2023-09-18 DIAGNOSIS — Z992 Dependence on renal dialysis: Secondary | ICD-10-CM | POA: Diagnosis not present

## 2023-09-18 DIAGNOSIS — N2581 Secondary hyperparathyroidism of renal origin: Secondary | ICD-10-CM | POA: Diagnosis not present

## 2023-09-18 DIAGNOSIS — E162 Hypoglycemia, unspecified: Secondary | ICD-10-CM | POA: Diagnosis not present

## 2023-09-18 DIAGNOSIS — N186 End stage renal disease: Secondary | ICD-10-CM | POA: Diagnosis not present

## 2023-09-18 DIAGNOSIS — D631 Anemia in chronic kidney disease: Secondary | ICD-10-CM | POA: Diagnosis not present

## 2023-09-18 NOTE — Telephone Encounter (Signed)
Prior Authorization for cpap titration sent to medicaid via web portal. Tracking Number :[id number] PA REQ FOR MEDICARE/MEDICAID   .

## 2023-09-18 NOTE — Addendum Note (Signed)
Addended by: Reesa Chew on: 09/18/2023 12:25 PM   Modules accepted: Orders

## 2023-09-21 DIAGNOSIS — Z992 Dependence on renal dialysis: Secondary | ICD-10-CM | POA: Diagnosis not present

## 2023-09-21 DIAGNOSIS — N2581 Secondary hyperparathyroidism of renal origin: Secondary | ICD-10-CM | POA: Diagnosis not present

## 2023-09-21 DIAGNOSIS — D631 Anemia in chronic kidney disease: Secondary | ICD-10-CM | POA: Diagnosis not present

## 2023-09-21 DIAGNOSIS — N186 End stage renal disease: Secondary | ICD-10-CM | POA: Diagnosis not present

## 2023-09-21 DIAGNOSIS — E162 Hypoglycemia, unspecified: Secondary | ICD-10-CM | POA: Diagnosis not present

## 2023-09-22 ENCOUNTER — Ambulatory Visit (INDEPENDENT_AMBULATORY_CARE_PROVIDER_SITE_OTHER): Payer: Medicare Other | Admitting: Family Medicine

## 2023-09-22 ENCOUNTER — Encounter: Payer: Self-pay | Admitting: Family Medicine

## 2023-09-22 VITALS — BP 122/80 | HR 85 | Temp 98.2°F | Ht <= 58 in | Wt 72.8 lb

## 2023-09-22 DIAGNOSIS — F339 Major depressive disorder, recurrent, unspecified: Secondary | ICD-10-CM

## 2023-09-22 DIAGNOSIS — H9202 Otalgia, left ear: Secondary | ICD-10-CM

## 2023-09-22 NOTE — Progress Notes (Unsigned)
 Subjective:  HPI: [name] is a 33 y.o. female presenting on 09/22/2023 for Follow-up ( 4 weeks (around 09/17/2023) for anxiety/depression - JBG////Wants home health referral?)   HPI Patient is in today for left ear pain for 3 days. Denies drainage, fever, chills, recent URI.  ROS  Relevant past medical history reviewed and updated as indicated.   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   Depression 05/14/2015   Dialysis care    ESRD (end stage renal disease) on dialysis [facility])    GERD (gastroesophageal reflux disease) 01/07/2017   HTN (hypertension) 05/02/2011   Murmur, cardiac 07/17/2022   Pneumonia, unspecified organism 07/27/2023   Spina bifida    UTI (lower urinary tract infection)    Viral URI with cough 09/15/2022     Past Surgical History:  Procedure Laterality Date   A/V FISTULAGRAM Left 10/14/2021   Procedure: A/V Fistulagram;  Surgeon: Maeola Harman, MD;  Location: [facility] INVASIVE CV LAB;  Service: Cardiovascular;  Laterality: Left;   AV FISTULA PLACEMENT     left arm   INSERTION OF DIALYSIS CATHETER N/A 05/08/2020   Procedure: ATTEMPTED, UNSUCCESSFUL INSERTION OF DIALYSIS CATHETER TUNNELED RIGHT INTERNAL JUGULAR;  Surgeon: Lucretia Roers, MD;  Location: [facility];  Service: General;  Laterality: N/A;   IR FLUORO GUIDE CV LINE RIGHT  05/09/2020   IR US GUIDE VASC ACCESS RIGHT  05/09/2020   RIGHT HEART CATH N/A 06/30/2023   Procedure: RIGHT HEART CATH;  Surgeon: Laurey Morale, MD;  Location: [facility] INVASIVE CV LAB;  Service: Cardiovascular;  Laterality: N/A;    Allergies and medications reviewed and updated.   Current Outpatient Medications:    acetaminophen (TYLENOL) 500 MG tablet, Take 1,000 mg by mouth every 6 (six) hours as needed for mild pain or headache., Disp: , Rfl:     albuterol (PROVENTIL) (2.5 MG/3ML) 0.083% nebulizer solution, Take 3 mLs (2.5 mg total) by nebulization every 6 (six) hours as needed for wheezing or shortness of breath., Disp: 150 mL, Rfl: 1   albuterol (VENTOLIN HFA) 108 (90 Base) MCG/ACT inhaler, Inhale 1-2 puffs into the lungs every 6 (six) hours as needed for wheezing or shortness of breath., Disp: 1 each, Rfl: 0   AURYXIA 1 GM 210 MG(Fe) tablet, Take 420 mg by mouth 2 (two) times daily with a meal., Disp: , Rfl: 3   B Complex-C-Folic Acid (DIALYVITE TABLET) TABS, Take 1 tablet by mouth daily., Disp: , Rfl:    diphenhydrAMINE (BENADRYL) 25 mg capsule, Take 25 mg by mouth daily as needed for allergies., Disp: , Rfl:    EPINEPHrine 0.3 mg/0.3 mL IJ SOAJ injection, Inject 0.3 mg into the muscle as needed for anaphylaxis., Disp: 2 each, Rfl: 1   metoprolol succinate (TOPROL-XL) 100 MG 24 hr tablet, Take 100 mg by mouth daily., Disp: , Rfl:    omeprazole (PRILOSEC) 40 MG capsule, Take 40 mg by mouth in the morning and at bedtime., Disp: , Rfl:    sodium zirconium cyclosilicate (LOKELMA) 5 g packet, Take 10 g by mouth every Monday, Wednesday, and Friday., Disp: , Rfl:   Allergies  Allergen Reactions   Ciprofloxacin Shortness Of Breath, Nausea And Vomiting and Other (See Comments)    HIGH FEVER and oral blisters    Other Anaphylaxis    Revaclear  dialzer   Peanut-Containing Drug Products Anaphylaxis   Aleve [Naproxen Sodium] Other (See Comments)    G.I.Bleed   Ceftriaxone Other (See Comments)    Blisters in mouth    Coconut (Cocos Nucifera) Hives   Influenza Vaccines Nausea And Vomiting and Other (See Comments)    High fever   Tetanus Toxoid, Adsorbed Nausea And Vomiting and Other (See Comments)    HIGH FEVER, also   Tetanus Toxoids Nausea And Vomiting and Other (See Comments)    HIGH FEVER   Latex Itching and Rash    Objective:   BP 122/80   Pulse 85   Temp 98.2 F (36.8 C) (Oral)   Ht 3' (0.914 m)   Wt 72 lb 12 oz (33 kg)    LMP  (Approximate)   SpO2 98%   BMI 39.47 kg/m      09/22/2023    3:35 PM 08/25/2023    3:41 PM 08/20/2023    3:52 PM  Vitals with BMI  Height 3\' 0"   3\' 0"   Weight 72 lbs 12 oz  --  BMI 39.5    Systolic 122 109 409  Diastolic 80 80 68  Pulse 85 95 89     Physical Exam  Assessment & Plan:  There are no diagnoses linked to this encounter.   Follow up plan: Return for annual physical with labs 1 week prior.  Park Meo, FNP

## 2023-09-23 ENCOUNTER — Encounter: Payer: Self-pay | Admitting: Family Medicine

## 2023-09-23 DIAGNOSIS — N2581 Secondary hyperparathyroidism of renal origin: Secondary | ICD-10-CM | POA: Diagnosis not present

## 2023-09-23 DIAGNOSIS — H9202 Otalgia, left ear: Secondary | ICD-10-CM | POA: Insufficient documentation

## 2023-09-23 DIAGNOSIS — E162 Hypoglycemia, unspecified: Secondary | ICD-10-CM | POA: Diagnosis not present

## 2023-09-23 DIAGNOSIS — D631 Anemia in chronic kidney disease: Secondary | ICD-10-CM | POA: Diagnosis not present

## 2023-09-23 DIAGNOSIS — Z992 Dependence on renal dialysis: Secondary | ICD-10-CM | POA: Diagnosis not present

## 2023-09-23 DIAGNOSIS — N186 End stage renal disease: Secondary | ICD-10-CM | POA: Diagnosis not present

## 2023-09-23 NOTE — Assessment & Plan Note (Signed)
 Normal exam. Continue to monitor, if symptoms persist or worsen return to office.

## 2023-09-23 NOTE — Assessment & Plan Note (Signed)
 Well controlled per pt and she declines intervention. Follow up PRN.

## 2023-09-24 ENCOUNTER — Telehealth: Payer: Self-pay

## 2023-09-24 NOTE — Telephone Encounter (Signed)
 error

## 2023-09-25 DIAGNOSIS — N2581 Secondary hyperparathyroidism of renal origin: Secondary | ICD-10-CM | POA: Diagnosis not present

## 2023-09-25 DIAGNOSIS — N186 End stage renal disease: Secondary | ICD-10-CM | POA: Diagnosis not present

## 2023-09-25 DIAGNOSIS — E162 Hypoglycemia, unspecified: Secondary | ICD-10-CM | POA: Diagnosis not present

## 2023-09-25 DIAGNOSIS — Z992 Dependence on renal dialysis: Secondary | ICD-10-CM | POA: Diagnosis not present

## 2023-09-25 DIAGNOSIS — D631 Anemia in chronic kidney disease: Secondary | ICD-10-CM | POA: Diagnosis not present

## 2023-09-26 DIAGNOSIS — I120 Hypertensive chronic kidney disease with stage 5 chronic kidney disease or end stage renal disease: Secondary | ICD-10-CM | POA: Diagnosis not present

## 2023-09-26 DIAGNOSIS — Z992 Dependence on renal dialysis: Secondary | ICD-10-CM | POA: Diagnosis not present

## 2023-09-26 DIAGNOSIS — N186 End stage renal disease: Secondary | ICD-10-CM | POA: Diagnosis not present

## 2023-09-28 DIAGNOSIS — Z992 Dependence on renal dialysis: Secondary | ICD-10-CM | POA: Diagnosis not present

## 2023-09-28 DIAGNOSIS — N2581 Secondary hyperparathyroidism of renal origin: Secondary | ICD-10-CM | POA: Diagnosis not present

## 2023-09-28 DIAGNOSIS — N186 End stage renal disease: Secondary | ICD-10-CM | POA: Diagnosis not present

## 2023-09-28 DIAGNOSIS — E162 Hypoglycemia, unspecified: Secondary | ICD-10-CM | POA: Diagnosis not present

## 2023-09-29 ENCOUNTER — Telehealth: Payer: Self-pay | Admitting: *Deleted

## 2023-09-29 ENCOUNTER — Encounter: Payer: Medicare Other | Admitting: Family Medicine

## 2023-09-29 NOTE — Progress Notes (Signed)
 Complex Care Management Care Guide Note  09/29/2023 Name: [name] MRN: [id number] DOB: 1991-02-05  [name] is a 33 y.o. year old female who is a primary care patient of Park Meo, FNP and is actively engaged with the care management team. I reached out to [name] by phone today to assist with scheduling  with the RN Case Manager.  Follow up plan: Telephone appointment with complex care management team member scheduled for:  3/11 Gwenevere Ghazi  [facility]  [facility], [facility] Guide  Direct Dial: [phone]  Fax [phone]

## 2023-09-30 DIAGNOSIS — Z992 Dependence on renal dialysis: Secondary | ICD-10-CM | POA: Diagnosis not present

## 2023-09-30 DIAGNOSIS — N186 End stage renal disease: Secondary | ICD-10-CM | POA: Diagnosis not present

## 2023-09-30 DIAGNOSIS — N2581 Secondary hyperparathyroidism of renal origin: Secondary | ICD-10-CM | POA: Diagnosis not present

## 2023-09-30 DIAGNOSIS — E162 Hypoglycemia, unspecified: Secondary | ICD-10-CM | POA: Diagnosis not present

## 2023-10-02 DIAGNOSIS — Z992 Dependence on renal dialysis: Secondary | ICD-10-CM | POA: Diagnosis not present

## 2023-10-02 DIAGNOSIS — N2581 Secondary hyperparathyroidism of renal origin: Secondary | ICD-10-CM | POA: Diagnosis not present

## 2023-10-02 DIAGNOSIS — E162 Hypoglycemia, unspecified: Secondary | ICD-10-CM | POA: Diagnosis not present

## 2023-10-02 DIAGNOSIS — N186 End stage renal disease: Secondary | ICD-10-CM | POA: Diagnosis not present

## 2023-10-05 DIAGNOSIS — Z992 Dependence on renal dialysis: Secondary | ICD-10-CM | POA: Diagnosis not present

## 2023-10-05 DIAGNOSIS — N2581 Secondary hyperparathyroidism of renal origin: Secondary | ICD-10-CM | POA: Diagnosis not present

## 2023-10-05 DIAGNOSIS — N186 End stage renal disease: Secondary | ICD-10-CM | POA: Diagnosis not present

## 2023-10-05 DIAGNOSIS — E162 Hypoglycemia, unspecified: Secondary | ICD-10-CM | POA: Diagnosis not present

## 2023-10-06 ENCOUNTER — Ambulatory Visit: Payer: Self-pay | Admitting: *Deleted

## 2023-10-06 NOTE — Patient Outreach (Signed)
 Care Coordination   Initial Visit Note   10/06/2023 Name: FELISSA BLOUCH MRN: [id number] DOB: 1991-03-22  Benard Rink is a 33 y.o. year old female who sees Park Meo, FNP for primary care. I spoke with  Benard Rink by phone today.  What matters to the patients health and wellness today?  Patient reports she is resting, She denies depression  She denies any worsening symptoms  Recenlty seen by her pcp on 09/22/23 for depression and declined resources/interventions Patient recently diagnosed with Obstructive sleep apnea (OSA) Recommended a CPAP by Dr Kym Groom cardiology   Goals Addressed             This Visit's Progress    Patient will be able to manage OSA at home nurse care manager       Interventions Today    Flowsheet Row Most Recent Value  Chronic Disease   Chronic disease during today's visit Congestive Heart Failure (CHF), Hypertension (HTN), Chronic Kidney Disease/End Stage Renal Disease (ESRD)  General Interventions   General Interventions Discussed/Reviewed General Interventions Discussed, Doctor Visits  Doctor Visits Discussed/Reviewed Doctor Visits Discussed, PCP  PCP/Specialist Visits Compliance with follow-up visit  Exercise Interventions   Exercise Discussed/Reviewed Exercise Discussed, Physical Activity  Physical Activity Discussed/Reviewed Physical Activity Discussed  Education Interventions   Education Provided Provided Education  Mental Health Interventions   Mental Health Discussed/Reviewed Mental Health Discussed, Coping Strategies  Pharmacy Interventions   Pharmacy Dicussed/Reviewed Pharmacy Topics Discussed, Affording Medications  Safety Interventions   Safety Discussed/Reviewed Safety Discussed, Home Safety              SDOH assessments and interventions completed:  Yes     Care Coordination Interventions:  Yes, provided   Follow up plan: Follow up call scheduled for 10/12/23    Encounter Outcome:  Patient Visit  Completed   Cala Bradford L. Noelle Penner, RN, BSN, CCM [place]  [facility], [facility] RN Care Manager Direct Dial: [phone]  Fax: [phone]

## 2023-10-06 NOTE — Patient Instructions (Signed)
 Visit Information  Thank you for taking time to visit with me today. Please don't hesitate to contact me if I can be of assistance to you.   Following are the goals we discussed today:   Goals Addressed             This Visit's Progress    Patient will be able to manage OSA at home nurse care manager       Interventions Today    Flowsheet Row Most Recent Value  Chronic Disease   Chronic disease during today's visit Congestive Heart Failure (CHF), Hypertension (HTN), Chronic Kidney Disease/End Stage Renal Disease (ESRD)  General Interventions   General Interventions Discussed/Reviewed General Interventions Discussed, Doctor Visits  Doctor Visits Discussed/Reviewed Doctor Visits Discussed, PCP  PCP/Specialist Visits Compliance with follow-up visit  Exercise Interventions   Exercise Discussed/Reviewed Exercise Discussed, Physical Activity  Physical Activity Discussed/Reviewed Physical Activity Discussed  Education Interventions   Education Provided Provided Education  Mental Health Interventions   Mental Health Discussed/Reviewed Mental Health Discussed, Coping Strategies  Pharmacy Interventions   Pharmacy Dicussed/Reviewed Pharmacy Topics Discussed, Affording Medications  Safety Interventions   Safety Discussed/Reviewed Safety Discussed, Home Safety              Our next appointment is by telephone on 10/13/23 at 1:15 pm  Please call the care guide team at [phone] if you need to cancel or reschedule your appointment.   If you are experiencing a Mental Health or Behavioral Health Crisis or need someone to talk to, please call the Suicide and Crisis Lifeline: 988 call the [place] National Suicide Prevention Lifeline: 801-042-6721 or TTY: 928-568-8897 TTY 930-776-5446) to talk to a trained counselor call 1-800-273-TALK (toll free, 24 hour hotline) go to [facility] [address] [place] [phone]) call the [facility]  Crisis Line: [phone] call 911   Patient verbalizes understanding of instructions and care plan provided today and agrees to view in MyChart. Active MyChart status and patient understanding of how to access instructions and care plan via MyChart confirmed with patient.     The patient has been provided with contact information for the care management team and has been advised to call with any health related questions or concerns.   Maie Kesinger L. Noelle Penner, RN, BSN, CCM [place]  [facility], [facility] Health RN Care Manager Direct Dial: [phone]  Fax: [phone]

## 2023-10-07 DIAGNOSIS — Z992 Dependence on renal dialysis: Secondary | ICD-10-CM | POA: Diagnosis not present

## 2023-10-07 DIAGNOSIS — E162 Hypoglycemia, unspecified: Secondary | ICD-10-CM | POA: Diagnosis not present

## 2023-10-07 DIAGNOSIS — N2581 Secondary hyperparathyroidism of renal origin: Secondary | ICD-10-CM | POA: Diagnosis not present

## 2023-10-07 DIAGNOSIS — N186 End stage renal disease: Secondary | ICD-10-CM | POA: Diagnosis not present

## 2023-10-09 DIAGNOSIS — N186 End stage renal disease: Secondary | ICD-10-CM | POA: Diagnosis not present

## 2023-10-09 DIAGNOSIS — N2581 Secondary hyperparathyroidism of renal origin: Secondary | ICD-10-CM | POA: Diagnosis not present

## 2023-10-09 DIAGNOSIS — Z992 Dependence on renal dialysis: Secondary | ICD-10-CM | POA: Diagnosis not present

## 2023-10-09 DIAGNOSIS — E162 Hypoglycemia, unspecified: Secondary | ICD-10-CM | POA: Diagnosis not present

## 2023-10-12 ENCOUNTER — Telehealth: Payer: Self-pay

## 2023-10-12 DIAGNOSIS — N2581 Secondary hyperparathyroidism of renal origin: Secondary | ICD-10-CM | POA: Diagnosis not present

## 2023-10-12 DIAGNOSIS — N186 End stage renal disease: Secondary | ICD-10-CM | POA: Diagnosis not present

## 2023-10-12 DIAGNOSIS — Z992 Dependence on renal dialysis: Secondary | ICD-10-CM | POA: Diagnosis not present

## 2023-10-12 DIAGNOSIS — E162 Hypoglycemia, unspecified: Secondary | ICD-10-CM | POA: Diagnosis not present

## 2023-10-12 NOTE — Telephone Encounter (Signed)
 Copied from CRM [id number]. Topic: Referral - Question >> Oct 12, 2023  2:28 PM Izetta Dakin wrote: Reason for CRM: Neysa Bonito, member care Production designer, theatre[employer]tele[employer], calling on behalf of patient in regard to a home health referral. Unable to locate any information related to a home health referral. Contacted [facility] and verified that no request for a referral has been received, but patient can request a referral during her 10/13/23 appointment. Relayed information to care manager and informed her that patient is scheduled for an appointment on 10/13/23 and that a home health referral request can be discussed at that time. Callback # [phone]

## 2023-10-12 NOTE — Telephone Encounter (Signed)
 Copied from CRM [id number]. Topic: General - Other >> Oct 12, 2023  2:20 PM Fredrich Romans wrote: Reason for CRM: Patient boyfriend called in to as about the order that he says was suppose to be sent out by provider for her to receive a home health aide?He said this was discussed at her last office vist with amber howard.

## 2023-10-12 NOTE — Telephone Encounter (Signed)
 Copied from CRM [id number]. Topic: General - Other >> Oct 12, 2023  2:53 PM Marlow Baars wrote: Reason for CRM: Wilkie Aye with [facility] [employer] who is the Care Manager for the patient just wanted to make sure everything was in order for the patient specifically home health that has previously been requested. She would like a call back if possible to discuss further at [phone]

## 2023-10-13 ENCOUNTER — Ambulatory Visit: Payer: Self-pay | Admitting: *Deleted

## 2023-10-13 NOTE — Patient Instructions (Signed)
 Visit Information  Thank you for taking time to visit with me today. Please don't hesitate to contact me if I can be of assistance to you.   Following are the goals we discussed today:   Goals Addressed               This Visit's Progress     Patient Stated     COMPLETED: Manage Covid symptoms [facility]) (pt-stated)   On track     Update: 10/13/23 covid symptoms resolved Transfer to [facility] care manager  Pcp care manager Lawanna Kobus Little for April 2025 +  Care Coordination Interventions: Provided education to patient to enhance basic understanding of COVID-19 as a viral disease, measures to prevent exposure, signs and symptoms, recommended vaccine schedule, when to contact provider Counseled on importance of regular laboratory monitoring as prescribed Provided FDA vaccine guidelines Screening for signs and symptoms of depression;  Discussed effects, symptoms, and management of "long COVID"' Assessed social determinant of health barriers     Interventions Today    Flowsheet Row Most Recent Value  Chronic Disease   Chronic disease during today's visit Congestive Heart Failure (CHF), Chronic Kidney Disease/End Stage Renal Disease (ESRD), Hypertension (HTN), Other  [warm transfer to [facility] case manager, update on pcp office [facility] care manager Angel Little, change in office for this care manager]  General Interventions   General Interventions Discussed/Reviewed General Interventions Reviewed, Doctor Visits, Level of Care, Communication with  Doctor Visits Discussed/Reviewed Doctor Visits Reviewed, PCP  PCP/Specialist Visits Compliance with follow-up visit  Communication with RN, PCP/Specialists  Level of Care Personal Care Services  [[facility] [place] [facility] referrals]  Mental Health Interventions   Mental Health Discussed/Reviewed Mental Health Reviewed, Coping Strategies  Nutrition Interventions   Nutrition Discussed/Reviewed Nutrition Discussed, Fluid intake               Our next appointment is  na  on na at na by [facility] care manager  Please call the care guide team at [phone] if you need to cancel or reschedule your appointment.   If you are experiencing a Mental Health or Behavioral Health Crisis or need someone to talk to, please call the Suicide and Crisis Lifeline: 988 call the [place] National Suicide Prevention Lifeline: 307-543-0327 or TTY: 360-139-2823 TTY 403-427-2347) to talk to a trained counselor call 1-800-273-TALK (toll free, 24 hour hotline) call the [facility]: (973) [phone] call 911   Patient verbalizes understanding of instructions and care plan provided today and agrees to view in MyChart. Active MyChart status and patient understanding of how to access instructions and care plan via MyChart confirmed with patient.     The patient has been provided with contact information for the care management team and has been advised to call with any health related questions or concerns.   Dodie Parisi L. Noelle Penner, RN, BSN, CCM [place]  [facility], [facility] Health RN Care Manager Direct Dial: [phone]  Fax: [phone]

## 2023-10-13 NOTE — Telephone Encounter (Signed)
 Clinton Gallant, RN  [facility] Replies will be sent to [facility] Completed an outreach to Barry. Delton See wanted me to share that Neysa Bonito, [facility] case manager wants a referral/order for home services for patient. Nelson wants [facility] staff to know that [facility] care take home health referrals without [employer] L. Noelle Penner, RN, BSN, CCM [place]  [facility], [facility] Health RN Care ManagerDirect Dial: [phone]  Fax: [phone]

## 2023-10-13 NOTE — Patient Outreach (Signed)
 Care Coordination   Follow Up Visit Note   10/13/2023 Name: [name] MRN: [id number] DOB: May 04, 1991  [name] is a 33 y.o. year old female who sees Park Meo, FNP for primary care. I spoke with  [name] by phone today.  What matters to the patients health and wellness today?  Outreach for warm transfer. Confirmed patient is connected with [facility] case manager, Neysa Bonito [phone]    Delton See mentions [place], [facility] case manager, would like pcp office to provide a 3051 referral/order. Also wanted to make pcp aware that [facility] [facility] take home health referrals without medicaid  Confirmed warm transfer with patient to [facility] and new pcp [facility] care manager name Lawanna Kobus Little as needed.  Voiced understanding   Goals Addressed               This Visit's Progress     Patient Stated     COMPLETED: Manage Covid symptoms [facility]) (pt-stated)   On track     Update: 10/13/23 covid symptoms resolved Transfer to [facility] care manager  Pcp care manager Lawanna Kobus Little for April 2025 +  Care Coordination Interventions: Provided education to patient to enhance basic understanding of COVID-19 as a viral disease, measures to prevent exposure, signs and symptoms, recommended vaccine schedule, when to contact provider Counseled on importance of regular laboratory monitoring as prescribed Provided FDA vaccine guidelines Screening for signs and symptoms of depression;  Discussed effects, symptoms, and management of "long COVID"' Assessed social determinant of health barriers     Interventions Today    Flowsheet Row Most Recent Value  Chronic Disease   Chronic disease during today's visit Congestive Heart Failure (CHF), Chronic Kidney Disease/End Stage Renal Disease (ESRD), Hypertension (HTN), Other  [warm transfer to [facility] case manager, update on pcp office [facility] care manager Angel Little, change in office for this care manager]  General Interventions    General Interventions Discussed/Reviewed General Interventions Reviewed, Doctor Visits, Level of Care, Communication with  Doctor Visits Discussed/Reviewed Doctor Visits Reviewed, PCP  PCP/Specialist Visits Compliance with follow-up visit  Communication with RN, PCP/Specialists  Level of Care Personal Care Services  [[facility] [place] [facility] accept referrals]  Mental Health Interventions   Mental Health Discussed/Reviewed Mental Health Reviewed, Coping Strategies  Nutrition Interventions   Nutrition Discussed/Reviewed Nutrition Discussed, Fluid intake              SDOH assessments and interventions completed:  No     Care Coordination Interventions:  Yes, provided   Follow up plan: No further intervention required. Patient is being cared for by Neysa Bonito, case manager at [employer]   Encounter Outcome:  Patient Visit Completed   Shaqueena Mauceri L. Noelle Penner, RN, BSN, CCM [place]  [facility], [facility] Health RN Care Manager Direct Dial: [phone]  Fax: [phone]

## 2023-10-14 DIAGNOSIS — E162 Hypoglycemia, unspecified: Secondary | ICD-10-CM | POA: Diagnosis not present

## 2023-10-14 DIAGNOSIS — Z992 Dependence on renal dialysis: Secondary | ICD-10-CM | POA: Diagnosis not present

## 2023-10-14 DIAGNOSIS — N186 End stage renal disease: Secondary | ICD-10-CM | POA: Diagnosis not present

## 2023-10-14 DIAGNOSIS — N2581 Secondary hyperparathyroidism of renal origin: Secondary | ICD-10-CM | POA: Diagnosis not present

## 2023-10-16 DIAGNOSIS — E162 Hypoglycemia, unspecified: Secondary | ICD-10-CM | POA: Diagnosis not present

## 2023-10-16 DIAGNOSIS — N2581 Secondary hyperparathyroidism of renal origin: Secondary | ICD-10-CM | POA: Diagnosis not present

## 2023-10-16 DIAGNOSIS — N186 End stage renal disease: Secondary | ICD-10-CM | POA: Diagnosis not present

## 2023-10-16 DIAGNOSIS — Z992 Dependence on renal dialysis: Secondary | ICD-10-CM | POA: Diagnosis not present

## 2023-10-19 DIAGNOSIS — N186 End stage renal disease: Secondary | ICD-10-CM | POA: Diagnosis not present

## 2023-10-19 DIAGNOSIS — N2581 Secondary hyperparathyroidism of renal origin: Secondary | ICD-10-CM | POA: Diagnosis not present

## 2023-10-19 DIAGNOSIS — Z992 Dependence on renal dialysis: Secondary | ICD-10-CM | POA: Diagnosis not present

## 2023-10-19 DIAGNOSIS — E162 Hypoglycemia, unspecified: Secondary | ICD-10-CM | POA: Diagnosis not present

## 2023-10-19 NOTE — Progress Notes (Deleted)
 NEUROLOGY CONSULTATION NOTE  CERENITY GOSHORN MRN: [id number] DOB: [birth year]  Referring provider: Kurtis Bushman, FNP Primary care provider: Kurtis Bushman, FNP  Reason for consult:  migraines  Assessment/Plan:   ***   Subjective:  [name] is a 33 year old ***-handed female with ESRD on HD (T/R/S), HTN, caudal regression syndrome and spina bifida who presents for migraines.  History supplemented by referring provider's note.  CT head personally reviewed.  Onset:  *** Location:  *** Quality:  *** Intensity:  ***.  *** denies new headache, thunderclap headache or severe headache that wakes *** from sleep. Aura:  *** Prodrome:  *** Postdrome:  *** Associated symptoms:  ***.  *** denies associated unilateral numbness or weakness. Duration:  *** Frequency:  *** Frequency of abortive medication: *** Triggers:  *** Relieving factors:  *** Activity:  ***  Seen in ED on 04/28/2022 for headache. 33  CT Head was normal.    Past NSAIDS/analgesics:  *** Past abortive triptans:  *** Past abortive ergotamine:  *** Past muscle relaxants:  *** Past anti-emetic:  metoclopramide, ondansetron, promethazine Past antihypertensive medications:  *** Past antidepressant medications:  *** Past anticonvulsant medications:  gabapentin Past anti-CGRP:  *** Past vitamins/Herbal/Supplements:  *** Past antihistamines/decongestants:  *** Other past therapies:  ***  Current NSAIDS/analgesics:  acetaminophen Current triptans:  none Current ergotamine:  none Current anti-emetic:  none Current muscle relaxants:  none Current Antihypertensive medications:  metoprolol succinate 100mg  daily Current Antidepressant medications:  none Current Anticonvulsant medications:  none Current anti-CGRP:  none Current Vitamins/Herbal/Supplements:  B complex Current Antihistamines/Decongestants:  diphenhydramine Other therapy:  *** Birth control:  none Other medications:  Auryxia   Caffeine:   *** Alcohol:  *** Smoker:  *** Diet:  *** Exercise:  *** Depression:  ***; Anxiety:  *** Other pain:  *** Sleep hygiene:  *** Family history of headache:  ***      PAST MEDICAL HISTORY: Past Medical History:  Diagnosis Date   Acute asthma exacerbation 07/15/2023   Anemia associated with chronic renal failure    Blood transfusion    Caudal regression syndrome    Assoc with spina bifida.   Depression 05/14/2015   Dialysis care    ESRD (end stage renal disease) on dialysis [facility])    GERD (gastroesophageal reflux disease) 01/07/2017   HTN (hypertension) 05/02/2011   Murmur, cardiac 07/17/2022   Pneumonia, unspecified organism 07/27/2023   Spina bifida    UTI (lower urinary tract infection)    Viral URI with cough 09/15/2022    PAST SURGICAL HISTORY: Past Surgical History:  Procedure Laterality Date   A/V FISTULAGRAM Left 10/14/2021   Procedure: A/V Fistulagram;  Surgeon: Maeola Harman, MD;  Location: [facility] INVASIVE CV LAB;  Service: Cardiovascular;  Laterality: Left;   AV FISTULA PLACEMENT     left arm   INSERTION OF DIALYSIS CATHETER N/A 05/08/2020   Procedure: ATTEMPTED, UNSUCCESSFUL INSERTION OF DIALYSIS CATHETER TUNNELED RIGHT INTERNAL JUGULAR;  Surgeon: Lucretia Roers, MD;  Location: [facility]S;  Service: General;  Laterality: N/A;   IR FLUORO GUIDE CV LINE RIGHT  05/09/2020   IR US GUIDE VASC ACCESS RIGHT  05/09/2020   RIGHT HEART CATH N/A 06/30/2023   Procedure: RIGHT HEART CATH;  Surgeon: Laurey Morale, MD;  Location: [facility] INVASIVE CV LAB;  Service: Cardiovascular;  Laterality: N/A;    MEDICATIONS: Current Outpatient Medications on File Prior to Visit  Medication Sig Dispense Refill   acetaminophen (TYLENOL) 500 MG tablet Take 1,000 mg  by mouth every 6 (six) hours as needed for mild pain or headache.     albuterol (PROVENTIL) (2.5 MG/3ML) 0.083% nebulizer solution Take 3 mLs (2.5 mg total) by nebulization every 6 (six) hours as needed for wheezing or  shortness of breath. 150 mL 1   albuterol (VENTOLIN HFA) 108 (90 Base) MCG/ACT inhaler Inhale 1-2 puffs into the lungs every 6 (six) hours as needed for wheezing or shortness of breath. 1 each 0   AURYXIA 1 GM 210 MG(Fe) tablet Take 420 mg by mouth 2 (two) times daily with a meal.  3   B Complex-C-Folic Acid (DIALYVITE TABLET) TABS Take 1 tablet by mouth daily.     diphenhydrAMINE (BENADRYL) 25 mg capsule Take 25 mg by mouth daily as needed for allergies.     EPINEPHrine 0.3 mg/0.3 mL IJ SOAJ injection Inject 0.3 mg into the muscle as needed for anaphylaxis. 2 each 1   metoprolol succinate (TOPROL-XL) 100 MG 24 hr tablet Take 100 mg by mouth daily.     omeprazole (PRILOSEC) 40 MG capsule Take 40 mg by mouth in the morning and at bedtime.     sodium zirconium cyclosilicate (LOKELMA) 5 g packet Take 10 g by mouth every Monday, Wednesday, and Friday.     No current facility-administered medications on file prior to visit.    ALLERGIES: Allergies  Allergen Reactions   Ciprofloxacin Shortness Of Breath, Nausea And Vomiting and Other (See Comments)    HIGH FEVER and oral blisters    Other Anaphylaxis    Revaclear dialzer   Peanut-Containing Drug Products Anaphylaxis   Aleve [Naproxen Sodium] Other (See Comments)    G.I.Bleed   Ceftriaxone Other (See Comments)    Blisters in mouth    Coconut (Cocos Nucifera) Hives   Influenza Vaccines Nausea And Vomiting and Other (See Comments)    High fever   Tetanus Toxoid, Adsorbed Nausea And Vomiting and Other (See Comments)    HIGH FEVER, also   Tetanus Toxoids Nausea And Vomiting and Other (See Comments)    HIGH FEVER   Latex Itching and Rash    FAMILY HISTORY: Family History  Problem Relation Age of Onset   Kidney cancer Other    Hypertension Maternal Grandmother    Arthritis Maternal Grandmother    Breast cancer Maternal Aunt    Colon cancer Neg Hx     Objective:  *** General: No acute distress.  Patient appears well-groomed.    Head:  Normocephalic/atraumatic Eyes:  fundi examined but not visualized Neck: supple, no paraspinal tenderness, full range of motion Back: No paraspinal tenderness Heart: regular rate and rhythm Lungs: Clear to auscultation bilaterally. Vascular: No carotid bruits. Neurological Exam: Mental status: alert and oriented to person, place, and time, speech fluent and not dysarthric, language intact. Cranial nerves: CN I: not tested CN II: pupils equal, round and reactive to light, visual fields intact CN III, IV, VI:  full range of motion, no nystagmus, no ptosis CN V: facial sensation intact. CN VII: upper and lower face symmetric CN VIII: hearing intact CN IX, X: gag intact, uvula midline CN XI: sternocleidomastoid and trapezius muscles intact CN XII: tongue midline Bulk & Tone: normal, no fasciculations. Motor:  muscle strength 5/5 throughout Sensation:  Pinprick, temperature and vibratory sensation intact. Deep Tendon Reflexes:  2+ throughout,  toes downgoing.   Finger to nose testing:  Without dysmetria.   Heel to shin:  Without dysmetria.   Gait:  Normal station and stride.  Romberg negative.  Thank you for allowing me to take part in the care of this patient.  Shon Millet, DO  CC: ***

## 2023-10-20 ENCOUNTER — Encounter: Payer: Self-pay | Admitting: Neurology

## 2023-10-20 ENCOUNTER — Ambulatory Visit: Payer: Medicare Other | Admitting: Neurology

## 2023-10-21 DIAGNOSIS — N186 End stage renal disease: Secondary | ICD-10-CM | POA: Diagnosis not present

## 2023-10-21 DIAGNOSIS — N2581 Secondary hyperparathyroidism of renal origin: Secondary | ICD-10-CM | POA: Diagnosis not present

## 2023-10-21 DIAGNOSIS — E162 Hypoglycemia, unspecified: Secondary | ICD-10-CM | POA: Diagnosis not present

## 2023-10-21 DIAGNOSIS — Z992 Dependence on renal dialysis: Secondary | ICD-10-CM | POA: Diagnosis not present

## 2023-10-23 DIAGNOSIS — E162 Hypoglycemia, unspecified: Secondary | ICD-10-CM | POA: Diagnosis not present

## 2023-10-23 DIAGNOSIS — N2581 Secondary hyperparathyroidism of renal origin: Secondary | ICD-10-CM | POA: Diagnosis not present

## 2023-10-23 DIAGNOSIS — Z992 Dependence on renal dialysis: Secondary | ICD-10-CM | POA: Diagnosis not present

## 2023-10-23 DIAGNOSIS — N186 End stage renal disease: Secondary | ICD-10-CM | POA: Diagnosis not present

## 2023-10-26 ENCOUNTER — Encounter (HOSPITAL_BASED_OUTPATIENT_CLINIC_OR_DEPARTMENT_OTHER): Payer: Self-pay | Admitting: Cardiology

## 2023-10-26 ENCOUNTER — Ambulatory Visit (HOSPITAL_BASED_OUTPATIENT_CLINIC_OR_DEPARTMENT_OTHER): Attending: Cardiology | Admitting: Cardiology

## 2023-10-26 ENCOUNTER — Telehealth: Payer: Self-pay

## 2023-10-26 VITALS — Ht <= 58 in | Wt 80.0 lb

## 2023-10-26 DIAGNOSIS — I110 Hypertensive heart disease with heart failure: Secondary | ICD-10-CM | POA: Diagnosis not present

## 2023-10-26 DIAGNOSIS — N2581 Secondary hyperparathyroidism of renal origin: Secondary | ICD-10-CM | POA: Diagnosis not present

## 2023-10-26 DIAGNOSIS — G4733 Obstructive sleep apnea (adult) (pediatric): Secondary | ICD-10-CM | POA: Diagnosis not present

## 2023-10-26 DIAGNOSIS — Z992 Dependence on renal dialysis: Secondary | ICD-10-CM | POA: Diagnosis not present

## 2023-10-26 DIAGNOSIS — I5022 Chronic systolic (congestive) heart failure: Secondary | ICD-10-CM | POA: Insufficient documentation

## 2023-10-26 DIAGNOSIS — E162 Hypoglycemia, unspecified: Secondary | ICD-10-CM | POA: Diagnosis not present

## 2023-10-26 DIAGNOSIS — N186 End stage renal disease: Secondary | ICD-10-CM | POA: Diagnosis not present

## 2023-10-26 DIAGNOSIS — I10 Essential (primary) hypertension: Secondary | ICD-10-CM

## 2023-10-26 HISTORY — DX: Obstructive sleep apnea (adult) (pediatric): G47.33

## 2023-10-26 NOTE — Telephone Encounter (Signed)
 Copied from CRM [id number]. Topic: Clinical - Home Health Verbal Orders >> Oct 26, 2023  1:38 PM Higinio Roger wrote: Glynis Smiles [facility] Coordinator) from [facility] is requesting a callback to discuss getting home health orders for patient  Callback #: [phone]

## 2023-10-27 ENCOUNTER — Encounter: Payer: Self-pay | Admitting: Family Medicine

## 2023-10-27 ENCOUNTER — Telehealth: Admitting: Family Medicine

## 2023-10-27 DIAGNOSIS — Z742 Need for assistance at home and no other household member able to render care: Secondary | ICD-10-CM | POA: Diagnosis not present

## 2023-10-27 DIAGNOSIS — I050 Rheumatic mitral stenosis: Secondary | ICD-10-CM | POA: Diagnosis not present

## 2023-10-27 DIAGNOSIS — Q059 Spina bifida, unspecified: Secondary | ICD-10-CM | POA: Diagnosis not present

## 2023-10-27 DIAGNOSIS — I50810 Right heart failure, unspecified: Secondary | ICD-10-CM | POA: Diagnosis not present

## 2023-10-27 DIAGNOSIS — I120 Hypertensive chronic kidney disease with stage 5 chronic kidney disease or end stage renal disease: Secondary | ICD-10-CM | POA: Diagnosis not present

## 2023-10-27 DIAGNOSIS — N186 End stage renal disease: Secondary | ICD-10-CM

## 2023-10-27 DIAGNOSIS — R531 Weakness: Secondary | ICD-10-CM | POA: Diagnosis not present

## 2023-10-27 DIAGNOSIS — Z992 Dependence on renal dialysis: Secondary | ICD-10-CM | POA: Diagnosis not present

## 2023-10-27 NOTE — Progress Notes (Signed)
 Virtual Visit via Video note  I connected with [name] on 10/27/23 at 1510 by video and verified that I am speaking with the correct person using two identifiers. [name] is currently located at home and her spouse is currently with her during visit. The provider, Park Meo, FNP is located in their office at time of visit. Unable to connect via video so call was completed via telephone.   I discussed the limitations, risks, security and privacy concerns of performing an evaluation and management service by video and the availability of in person appointments. I also discussed with the patient that there may be a patient responsible charge related to this service. The patient expressed understanding and agreed to proceed.  Subjective: PCP: Park Meo, FNP  No chief complaint on file.   Visit today with regards to need for home health services. Ms Axford reports she is having increasing weakness in her bilateral upper extremities and difficulty with bathing and transferring to tub, laundry, dishes, cleaning, and food prep. Ms Mccorry has ESRD, spina bifida with caudal regression syndrome, and RV failure/mitral stenosis. She has been fairly independent in the past however her condition is worsening due to worsening cardiac function. In addition, her live in partner has been recently debilitated due to a below the knee amputation, his ability to assist has been inhibited by this. Home health services have been requested. She does have a Child psychotherapist as well.     ROS: Per HPI  Current Outpatient Medications:    acetaminophen (TYLENOL) 500 MG tablet, Take 1,000 mg by mouth every 6 (six) hours as needed for mild pain or headache., Disp: , Rfl:    albuterol (PROVENTIL) (2.5 MG/3ML) 0.083% nebulizer solution, Take 3 mLs (2.5 mg total) by nebulization every 6 (six) hours as needed for wheezing or shortness of breath., Disp: 150 mL, Rfl: 1   albuterol (VENTOLIN HFA) 108  (90 Base) MCG/ACT inhaler, Inhale 1-2 puffs into the lungs every 6 (six) hours as needed for wheezing or shortness of breath., Disp: 1 each, Rfl: 0   AURYXIA 1 GM 210 MG(Fe) tablet, Take 420 mg by mouth 2 (two) times daily with a meal., Disp: , Rfl: 3   B Complex-C-Folic Acid (DIALYVITE TABLET) TABS, Take 1 tablet by mouth daily., Disp: , Rfl:    diphenhydrAMINE (BENADRYL) 25 mg capsule, Take 25 mg by mouth daily as needed for allergies., Disp: , Rfl:    EPINEPHrine 0.3 mg/0.3 mL IJ SOAJ injection, Inject 0.3 mg into the muscle as needed for anaphylaxis., Disp: 2 each, Rfl: 1   metoprolol succinate (TOPROL-XL) 100 MG 24 hr tablet, Take 100 mg by mouth daily., Disp: , Rfl:    omeprazole (PRILOSEC) 40 MG capsule, Take 40 mg by mouth in the morning and at bedtime., Disp: , Rfl:    sodium zirconium cyclosilicate (LOKELMA) 5 g packet, Take 10 g by mouth every Monday, Wednesday, and Friday., Disp: , Rfl:   Observations/Objective: Physical Exam Assessment and Plan: There are no diagnoses linked to this encounter.  Follow Up Instructions: No follow-ups on file.   I discussed the assessment and treatment plan with the patient. The patient was provided an opportunity to ask questions and all were answered. The patient agreed with the plan and demonstrated an understanding of the instructions.   The patient was advised to call back or seek an in-person evaluation if the symptoms worsen or if the condition fails to improve as anticipated.  The above assessment and management plan was discussed with the patient. The patient verbalized understanding of and has agreed to the management plan. Patient is aware to call the clinic if symptoms persist or worsen. Patient is aware when to return to the clinic for a follow-up visit. Patient educated on when it is appropriate to go to the emergency department.   Time call ended: 1521  I provided 11 minutes of face-to-face time during this encounter.   Kurtis Bushman, MSN, APRN, FNP-C [facility]

## 2023-10-27 NOTE — Procedures (Signed)
 [facility] [address] [place], [place] [address] Tel: [phone]   Fax: [phone]  Titration Interpretation  Patient Name:  [name], [name] Date:  10/26/2023 Referring Physician:  Dr. Armanda Magic  Indications for Polysomnography The patient is a 33 year old Female who is 3\' 1"  and weighs 80.0 lbs. Her BMI equals 41.1.  A full night titration treatment study was performed.  Medication was taken at 8:00 pm  Tylenol extra strength   Polysomnogram Data A full night polysomnogram recorded the standard physiologic parameters including EEG, EOG, EMG, EKG, nasal and oral airflow.  Respiratory parameters of chest and abdominal movements were recorded with Respiratory Inductance Plethysmography belts.  Oxygen saturation was recorded by pulse oximetry.   Sleep Architecture The total recording time of the polysomnogram was 371.8 minutes.  The total sleep time was 256.0 minutes.  The patient spent 8.6% of total sleep time in Stage N1, 55.3% in Stage N2, 0.0% in Stages N3, and 36.1% in REM.  Sleep latency was 40.4 minutes.  REM latency was 90.0 minutes.  Sleep Efficiency was 68.8%.  Wake after Sleep Onset time was 75.0 minutes.  Titration Summary The patient was titrated at pressures ranging from 6cm/H20 up to 24/20cm/H20. The last pressure used in the study was 24/20cm/H20.  Respiratory Events The polysomnogram revealed a presence of 1 obstructive, 3 central, and 1 mixed apneas resulting in an Apnea index of 1.2 events per hour.  There were 136 hypopneas (>=3% desaturation and/or arousal) resulting in an Apnea\Hypopnea Index (AHI >=3% desaturation and/or arousal) of 33.0 events per hour.  There were 88 hypopneas (>=4% desaturation) resulting in an Apnea\Hypopnea Index (AHI >=4% desaturation) of 21.8 events per hour.  There were 12 Respiratory Effort Related Arousals resulting in a RERA index of 2.8 events per hour. The Respiratory Disturbance Index is  35.9 events per hour.  The snore index was 99.4 events per hour.  Mean oxygen saturation was 90.2%.  The lowest oxygen saturation during sleep was 73.0%.  Time spent <=88% oxygen saturation was 127.0 minutes (35.7%).  Limb Activity There were 0 limb movements recorded.    Cardiac Summary The average pulse rate was 78.1 bpm.  The minimum pulse rate was 60.0 bpm while the maximum pulse rate was 97.0 bpm.  Cardiac rhythm was normal  Diagnosis: Obstructive Sleep Apnea Unsuccessful CPAP titration due to ongoing respiratory events.  Recommendations: Recommend in lab BiPAP titration for sleep disordered breathing. The patient should be counseled to avoid sleeping supine. The patient should be counseled in good sleep hygiene. Encourage patient to avoid driving with sleepy.   This study was personally reviewed and electronically signed by: Dr. Armanda Magic Accredited Board Certified in Sleep Medicine Date/Time: 10/27/23 8:00AM

## 2023-10-28 DIAGNOSIS — N186 End stage renal disease: Secondary | ICD-10-CM | POA: Diagnosis not present

## 2023-10-28 DIAGNOSIS — Z992 Dependence on renal dialysis: Secondary | ICD-10-CM | POA: Diagnosis not present

## 2023-10-28 DIAGNOSIS — N2581 Secondary hyperparathyroidism of renal origin: Secondary | ICD-10-CM | POA: Diagnosis not present

## 2023-10-28 DIAGNOSIS — E162 Hypoglycemia, unspecified: Secondary | ICD-10-CM | POA: Diagnosis not present

## 2023-10-29 ENCOUNTER — Telehealth: Payer: Self-pay

## 2023-10-29 DIAGNOSIS — R931 Abnormal findings on diagnostic imaging of heart and coronary circulation: Secondary | ICD-10-CM

## 2023-10-29 DIAGNOSIS — G4733 Obstructive sleep apnea (adult) (pediatric): Secondary | ICD-10-CM

## 2023-10-29 DIAGNOSIS — I429 Cardiomyopathy, unspecified: Secondary | ICD-10-CM

## 2023-10-29 DIAGNOSIS — N186 End stage renal disease: Secondary | ICD-10-CM

## 2023-10-29 DIAGNOSIS — Q057 Lumbar spina bifida without hydrocephalus: Secondary | ICD-10-CM

## 2023-10-29 DIAGNOSIS — M79601 Pain in right arm: Secondary | ICD-10-CM

## 2023-10-29 DIAGNOSIS — I050 Rheumatic mitral stenosis: Secondary | ICD-10-CM

## 2023-10-29 DIAGNOSIS — I10 Essential (primary) hypertension: Secondary | ICD-10-CM

## 2023-10-29 DIAGNOSIS — I5022 Chronic systolic (congestive) heart failure: Secondary | ICD-10-CM

## 2023-10-29 NOTE — Telephone Encounter (Signed)
 Left VM, per DPR, notified patient of unsuccessful CPAP Titration, BiPAP Titration ordered today. Callback number left for questions and/or concerns.

## 2023-10-29 NOTE — Telephone Encounter (Signed)
-----   Message from Armanda Magic sent at 10/27/2023  8:03 AM EDT ----- Unsuccessful CPAP titration due to ongoing events.  Please set up in lab BiPAP titration

## 2023-10-30 DIAGNOSIS — N186 End stage renal disease: Secondary | ICD-10-CM | POA: Diagnosis not present

## 2023-10-30 DIAGNOSIS — E162 Hypoglycemia, unspecified: Secondary | ICD-10-CM | POA: Diagnosis not present

## 2023-10-30 DIAGNOSIS — N2581 Secondary hyperparathyroidism of renal origin: Secondary | ICD-10-CM | POA: Diagnosis not present

## 2023-10-30 DIAGNOSIS — Z992 Dependence on renal dialysis: Secondary | ICD-10-CM | POA: Diagnosis not present

## 2023-11-02 ENCOUNTER — Telehealth: Payer: Self-pay

## 2023-11-02 DIAGNOSIS — N186 End stage renal disease: Secondary | ICD-10-CM | POA: Diagnosis not present

## 2023-11-02 DIAGNOSIS — Z992 Dependence on renal dialysis: Secondary | ICD-10-CM | POA: Diagnosis not present

## 2023-11-02 DIAGNOSIS — E162 Hypoglycemia, unspecified: Secondary | ICD-10-CM | POA: Diagnosis not present

## 2023-11-02 DIAGNOSIS — N2581 Secondary hyperparathyroidism of renal origin: Secondary | ICD-10-CM | POA: Diagnosis not present

## 2023-11-02 NOTE — Telephone Encounter (Addendum)
**Note De-identified Aerial Dilley Obfuscation** -----  **Note De-Identified Jasdeep Kepner Obfuscation** Message from Armanda Magic sent at 10/27/2023  8:03 AM EDT ----- Unsuccessful CPAP titration due to ongoing events.  Please set up in lab BiPAP titration  Per the Medicare Part A and B website: No Routine PA: Medicare does not require prior authorization for sleep studies, including those billed with CPT code 16109 (BIPAP Titration).  I have transferred the order to the sleep lab so they can contact the pt to schedule this test.

## 2023-11-04 ENCOUNTER — Telehealth: Payer: Self-pay

## 2023-11-04 DIAGNOSIS — N2581 Secondary hyperparathyroidism of renal origin: Secondary | ICD-10-CM | POA: Diagnosis not present

## 2023-11-04 DIAGNOSIS — Z992 Dependence on renal dialysis: Secondary | ICD-10-CM | POA: Diagnosis not present

## 2023-11-04 DIAGNOSIS — N186 End stage renal disease: Secondary | ICD-10-CM | POA: Diagnosis not present

## 2023-11-04 DIAGNOSIS — E162 Hypoglycemia, unspecified: Secondary | ICD-10-CM | POA: Diagnosis not present

## 2023-11-04 NOTE — Telephone Encounter (Signed)
 4098 Form for home health is in green folder. Will need to be faxed to [facility] at [phone].  If any issues, call Delaney Meigs at [facility] at [phone].

## 2023-11-04 NOTE — Telephone Encounter (Signed)
 Delton See called to f/u on the 3051 form, advised someone will f/u with patient

## 2023-11-05 NOTE — Telephone Encounter (Signed)
 Received CRM message about patient clarifying her insurance as [employer] and not [employer]. Outbound call placed to return call  As instructed by provider's nurse, I advised [name] that the patient has [employer] through [employer], and [employer] as the [employer]. Also advised him that the patient's paperwork was reviewed and signed by the provider this morning, and thereafter faxed to both [facility] and [facility] as per [place] at [place].   Fax successfully sent to [facility]  at [phone] with a confirmation time stamp of 11-05-2023 08:49.  Also successfully sent to [facility] at [phone] with a confirmation time stamp of 11-05-2023 09:29.  [name] is taking over this case for [name] and should be notified of any issues or concerns with processing paperwork.

## 2023-11-06 ENCOUNTER — Encounter: Payer: Self-pay | Admitting: Allergy

## 2023-11-06 DIAGNOSIS — E162 Hypoglycemia, unspecified: Secondary | ICD-10-CM | POA: Diagnosis not present

## 2023-11-06 DIAGNOSIS — N186 End stage renal disease: Secondary | ICD-10-CM | POA: Diagnosis not present

## 2023-11-06 DIAGNOSIS — N2581 Secondary hyperparathyroidism of renal origin: Secondary | ICD-10-CM | POA: Diagnosis not present

## 2023-11-06 DIAGNOSIS — Z992 Dependence on renal dialysis: Secondary | ICD-10-CM | POA: Diagnosis not present

## 2023-11-09 DIAGNOSIS — N186 End stage renal disease: Secondary | ICD-10-CM | POA: Diagnosis not present

## 2023-11-09 DIAGNOSIS — Z992 Dependence on renal dialysis: Secondary | ICD-10-CM | POA: Diagnosis not present

## 2023-11-09 DIAGNOSIS — N2581 Secondary hyperparathyroidism of renal origin: Secondary | ICD-10-CM | POA: Diagnosis not present

## 2023-11-09 DIAGNOSIS — E162 Hypoglycemia, unspecified: Secondary | ICD-10-CM | POA: Diagnosis not present

## 2023-11-10 IMAGING — CR DG CERVICAL SPINE COMPLETE 4+V
6 series · 6 of 6 positions shown · non-contrast
Comparison: May 25, 2019

CLINICAL DATA: Posterior neck pain x1 week.

EXAM:
CERVICAL SPINE - COMPLETE 4+ VIEW

[w cervical spine lat]
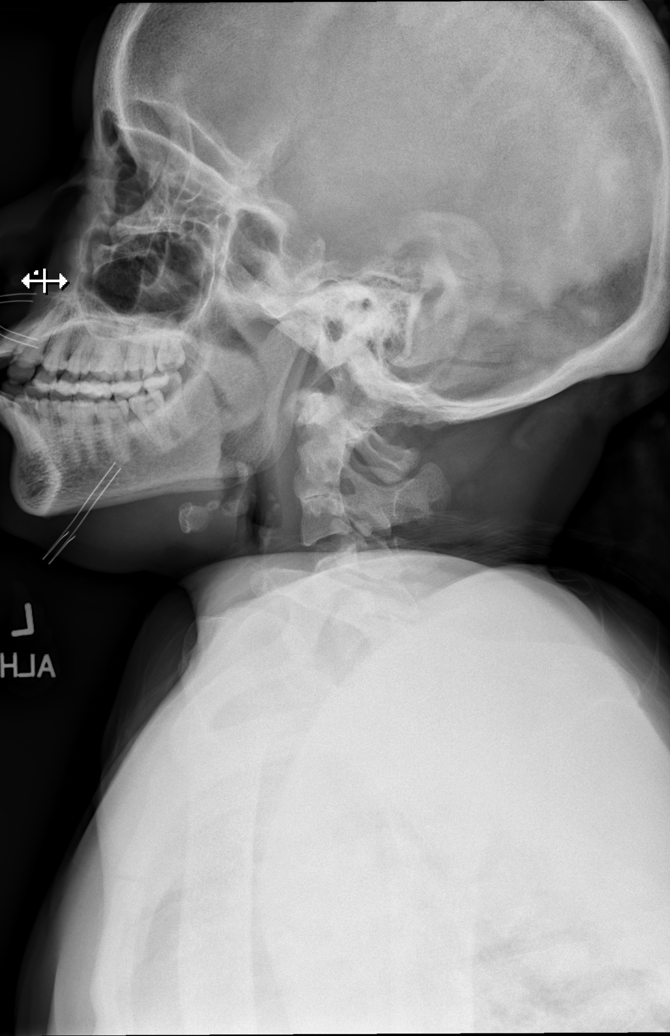

[w cervical spine ap_obl (1 of 2)]
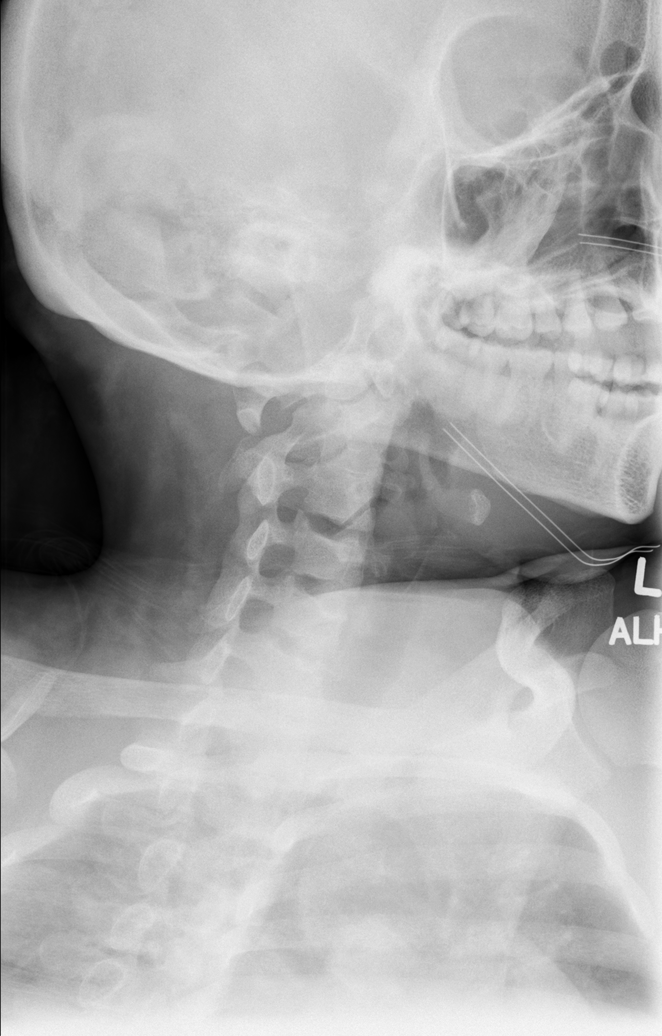

[w cervical spine ap_obl (2 of 2)]
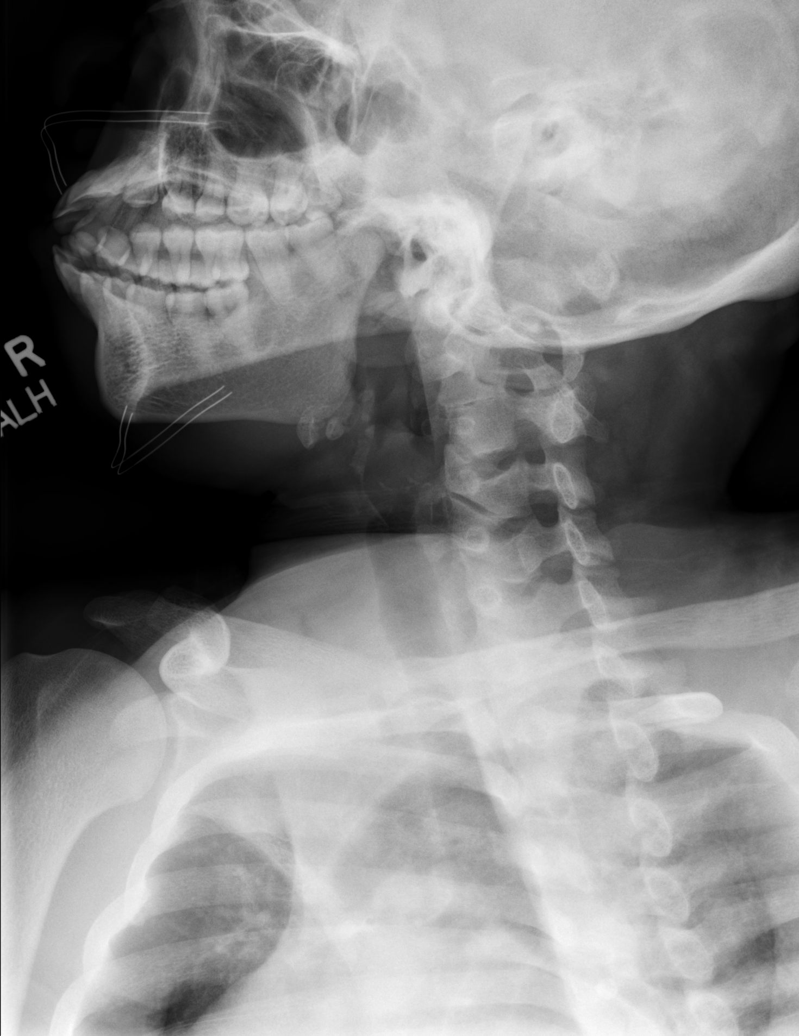

[w cervical spine ap]
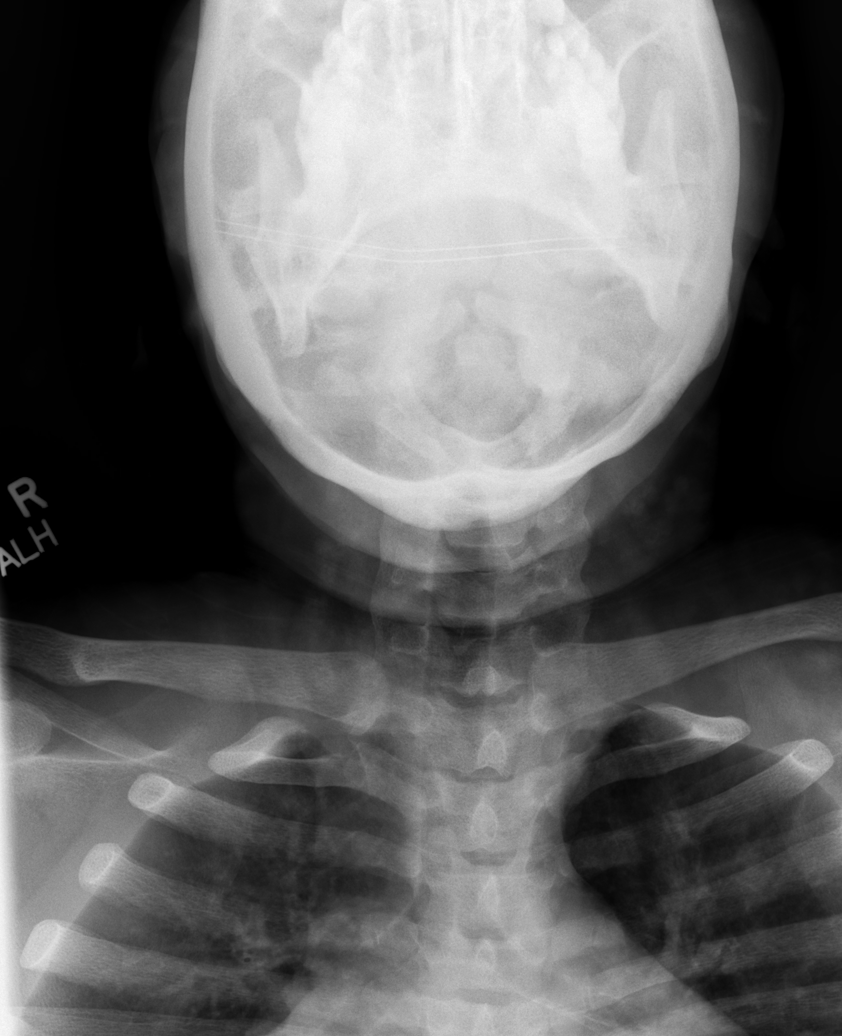

[w cervical spine odontoid]
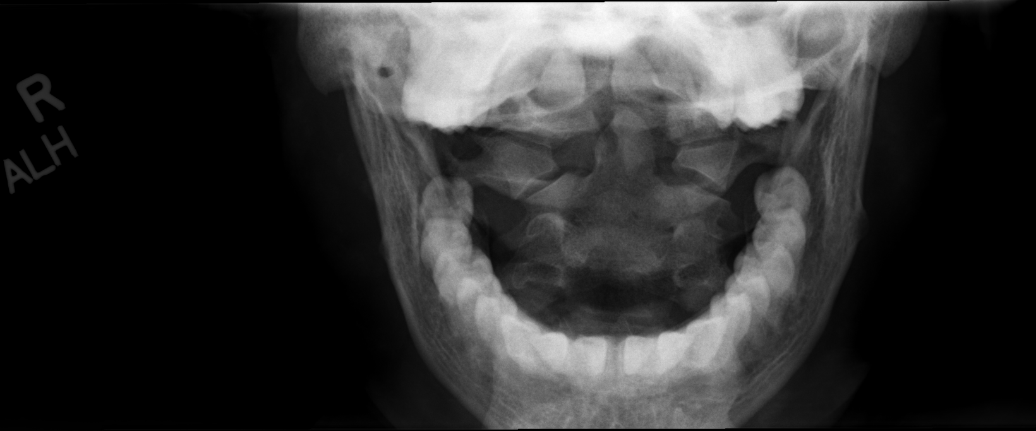

[w cervical swimmers]
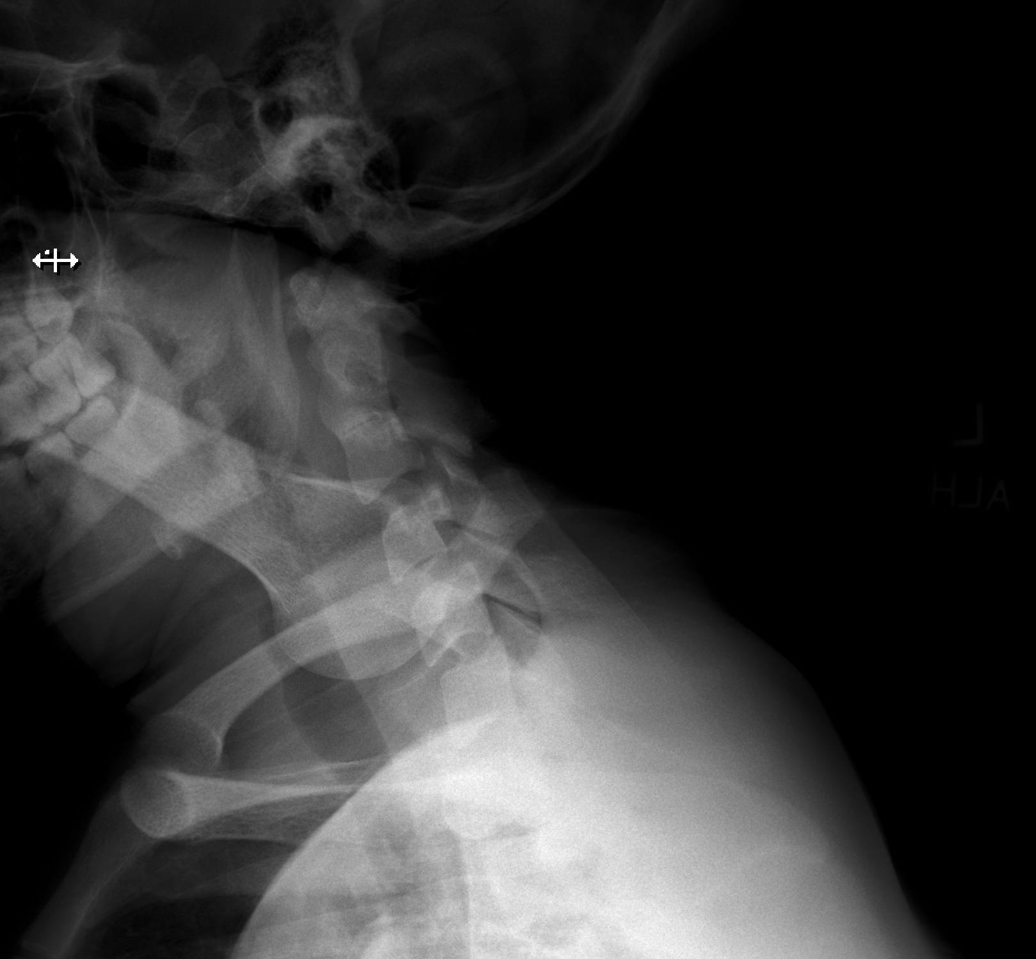

[6 of 6 positions shown; findings below may reference images not displayed]

FINDINGS: Styles morphology of C2-C3 is again seen. There is no evidence
of acute cervical spine fracture or prevertebral soft tissue
swelling. Alignment is normal. Soft tissue structures are
unremarkable.
IMPRESSION: 1. No acute cervical spine fracture or prevertebral soft tissue
swelling.
2. Styles morphology of C2-C3.

## 2023-11-11 DIAGNOSIS — N186 End stage renal disease: Secondary | ICD-10-CM | POA: Diagnosis not present

## 2023-11-11 DIAGNOSIS — N2581 Secondary hyperparathyroidism of renal origin: Secondary | ICD-10-CM | POA: Diagnosis not present

## 2023-11-11 DIAGNOSIS — E162 Hypoglycemia, unspecified: Secondary | ICD-10-CM | POA: Diagnosis not present

## 2023-11-11 DIAGNOSIS — Z992 Dependence on renal dialysis: Secondary | ICD-10-CM | POA: Diagnosis not present

## 2023-11-13 DIAGNOSIS — E162 Hypoglycemia, unspecified: Secondary | ICD-10-CM | POA: Diagnosis not present

## 2023-11-13 DIAGNOSIS — N2581 Secondary hyperparathyroidism of renal origin: Secondary | ICD-10-CM | POA: Diagnosis not present

## 2023-11-13 DIAGNOSIS — Z992 Dependence on renal dialysis: Secondary | ICD-10-CM | POA: Diagnosis not present

## 2023-11-13 DIAGNOSIS — N186 End stage renal disease: Secondary | ICD-10-CM | POA: Diagnosis not present

## 2023-11-16 DIAGNOSIS — N2581 Secondary hyperparathyroidism of renal origin: Secondary | ICD-10-CM | POA: Diagnosis not present

## 2023-11-16 DIAGNOSIS — N186 End stage renal disease: Secondary | ICD-10-CM | POA: Diagnosis not present

## 2023-11-16 DIAGNOSIS — Z992 Dependence on renal dialysis: Secondary | ICD-10-CM | POA: Diagnosis not present

## 2023-11-16 DIAGNOSIS — E162 Hypoglycemia, unspecified: Secondary | ICD-10-CM | POA: Diagnosis not present

## 2023-11-18 DIAGNOSIS — N186 End stage renal disease: Secondary | ICD-10-CM | POA: Diagnosis not present

## 2023-11-18 DIAGNOSIS — N2581 Secondary hyperparathyroidism of renal origin: Secondary | ICD-10-CM | POA: Diagnosis not present

## 2023-11-18 DIAGNOSIS — E162 Hypoglycemia, unspecified: Secondary | ICD-10-CM | POA: Diagnosis not present

## 2023-11-18 DIAGNOSIS — Z992 Dependence on renal dialysis: Secondary | ICD-10-CM | POA: Diagnosis not present

## 2023-11-20 DIAGNOSIS — Z992 Dependence on renal dialysis: Secondary | ICD-10-CM | POA: Diagnosis not present

## 2023-11-20 DIAGNOSIS — E162 Hypoglycemia, unspecified: Secondary | ICD-10-CM | POA: Diagnosis not present

## 2023-11-20 DIAGNOSIS — N2581 Secondary hyperparathyroidism of renal origin: Secondary | ICD-10-CM | POA: Diagnosis not present

## 2023-11-20 DIAGNOSIS — N186 End stage renal disease: Secondary | ICD-10-CM | POA: Diagnosis not present

## 2023-11-23 DIAGNOSIS — N186 End stage renal disease: Secondary | ICD-10-CM | POA: Diagnosis not present

## 2023-11-23 DIAGNOSIS — E162 Hypoglycemia, unspecified: Secondary | ICD-10-CM | POA: Diagnosis not present

## 2023-11-23 DIAGNOSIS — Z992 Dependence on renal dialysis: Secondary | ICD-10-CM | POA: Diagnosis not present

## 2023-11-23 DIAGNOSIS — N2581 Secondary hyperparathyroidism of renal origin: Secondary | ICD-10-CM | POA: Diagnosis not present

## 2023-11-24 ENCOUNTER — Encounter: Payer: Medicare Other | Admitting: Family Medicine

## 2023-11-25 DIAGNOSIS — N2581 Secondary hyperparathyroidism of renal origin: Secondary | ICD-10-CM | POA: Diagnosis not present

## 2023-11-25 DIAGNOSIS — Z992 Dependence on renal dialysis: Secondary | ICD-10-CM | POA: Diagnosis not present

## 2023-11-25 DIAGNOSIS — N186 End stage renal disease: Secondary | ICD-10-CM | POA: Diagnosis not present

## 2023-11-25 DIAGNOSIS — E162 Hypoglycemia, unspecified: Secondary | ICD-10-CM | POA: Diagnosis not present

## 2023-11-26 ENCOUNTER — Encounter (HOSPITAL_COMMUNITY): Payer: Self-pay

## 2023-11-26 ENCOUNTER — Emergency Department (HOSPITAL_COMMUNITY)

## 2023-11-26 ENCOUNTER — Other Ambulatory Visit: Payer: Self-pay

## 2023-11-26 ENCOUNTER — Emergency Department (HOSPITAL_COMMUNITY)
Admission: EM | Admit: 2023-11-26 | Discharge: 2023-11-26 | Disposition: A | Discharge status: Home / Self Care | Attending: Emergency Medicine | Admitting: Emergency Medicine

## 2023-11-26 DIAGNOSIS — I517 Cardiomegaly: Secondary | ICD-10-CM | POA: Diagnosis not present

## 2023-11-26 DIAGNOSIS — D631 Anemia in chronic kidney disease: Secondary | ICD-10-CM | POA: Insufficient documentation

## 2023-11-26 DIAGNOSIS — Z91040 Latex allergy status: Secondary | ICD-10-CM | POA: Insufficient documentation

## 2023-11-26 DIAGNOSIS — N186 End stage renal disease: Secondary | ICD-10-CM

## 2023-11-26 DIAGNOSIS — J90 Pleural effusion, not elsewhere classified: Secondary | ICD-10-CM

## 2023-11-26 DIAGNOSIS — R000 Tachycardia, unspecified: Secondary | ICD-10-CM | POA: Diagnosis not present

## 2023-11-26 DIAGNOSIS — R42 Dizziness and giddiness: Secondary | ICD-10-CM

## 2023-11-26 DIAGNOSIS — I120 Hypertensive chronic kidney disease with stage 5 chronic kidney disease or end stage renal disease: Secondary | ICD-10-CM | POA: Diagnosis not present

## 2023-11-26 DIAGNOSIS — Z79899 Other long term (current) drug therapy: Secondary | ICD-10-CM | POA: Insufficient documentation

## 2023-11-26 DIAGNOSIS — M791 Myalgia, unspecified site: Secondary | ICD-10-CM | POA: Diagnosis not present

## 2023-11-26 DIAGNOSIS — N189 Chronic kidney disease, unspecified: Secondary | ICD-10-CM

## 2023-11-26 DIAGNOSIS — Z992 Dependence on renal dialysis: Secondary | ICD-10-CM | POA: Diagnosis not present

## 2023-11-26 DIAGNOSIS — Z91010 Allergy to peanuts: Secondary | ICD-10-CM | POA: Insufficient documentation

## 2023-11-26 DIAGNOSIS — R059 Cough, unspecified: Secondary | ICD-10-CM | POA: Insufficient documentation

## 2023-11-26 DIAGNOSIS — R140 Abdominal distension (gaseous): Secondary | ICD-10-CM | POA: Diagnosis not present

## 2023-11-26 DIAGNOSIS — Z7401 Bed confinement status: Secondary | ICD-10-CM | POA: Diagnosis not present

## 2023-11-26 DIAGNOSIS — D649 Anemia, unspecified: Secondary | ICD-10-CM | POA: Diagnosis not present

## 2023-11-26 DIAGNOSIS — R110 Nausea: Secondary | ICD-10-CM | POA: Diagnosis not present

## 2023-11-26 LAB — CBC WITH DIFFERENTIAL/PLATELET
Abs Immature Granulocytes: 0.02 10*3/uL (ref 0.00–0.07)
Basophils Absolute: 0 10*3/uL (ref 0.0–0.1)
Basophils Relative: 0 %
Eosinophils Absolute: 0.2 10*3/uL (ref 0.0–0.5)
Eosinophils Relative: 4 %
HCT: 37.7 % (ref 36.0–46.0)
Hemoglobin: 11.5 g/dL — ABNORMAL LOW (ref 12.0–15.0)
Immature Granulocytes: 0 %
Lymphocytes Relative: 17 %
Lymphs Abs: 1 10*3/uL (ref 0.7–4.0)
MCH: 31.2 pg (ref 26.0–34.0)
MCHC: 30.5 g/dL (ref 30.0–36.0)
MCV: 102.2 fL — ABNORMAL HIGH (ref 80.0–100.0)
Monocytes Absolute: 0.5 10*3/uL (ref 0.1–1.0)
Monocytes Relative: 9 %
Neutro Abs: 4.1 10*3/uL (ref 1.7–7.7)
Neutrophils Relative %: 70 %
Platelets: 94 10*3/uL — ABNORMAL LOW (ref 150–400)
RBC: 3.69 MIL/uL — ABNORMAL LOW (ref 3.87–5.11)
RDW: 15.6 % — ABNORMAL HIGH (ref 11.5–15.5)
WBC: 5.9 10*3/uL (ref 4.0–10.5)
nRBC: 0 % (ref 0.0–0.2)

## 2023-11-26 LAB — BASIC METABOLIC PANEL WITH GFR
Anion gap: 15 (ref 5–15)
BUN: 20 mg/dL (ref 6–20)
CO2: 29 mmol/L (ref 22–32)
Calcium: 9.5 mg/dL (ref 8.9–10.3)
Chloride: 96 mmol/L — ABNORMAL LOW (ref 98–111)
Creatinine, Ser: 4.1 mg/dL — ABNORMAL HIGH (ref 0.44–1.00)
GFR, Estimated: 14 mL/min — ABNORMAL LOW (ref >60)
Glucose, Bld: 99 mg/dL (ref 70–99)
Potassium: 4.6 mmol/L (ref 3.5–5.1)
Sodium: 140 mmol/L (ref 135–145)

## 2023-11-26 LAB — RESP PANEL BY RT-PCR (RSV, FLU A&B, COVID)  RVPGX2
Influenza A by PCR: NEGATIVE
Influenza B by PCR: NEGATIVE
Resp Syncytial Virus by PCR: NEGATIVE
SARS Coronavirus 2 by RT PCR: NEGATIVE

## 2023-11-26 MED ORDER — MECLIZINE HCL 25 MG PO TABS
25.0000 mg | ORAL_TABLET | Freq: Once | ORAL | Status: AC
  Administered 2023-11-26: 25 mg via ORAL
  Filled 2023-11-26: qty 1

## 2023-11-26 MED ORDER — MECLIZINE HCL 12.5 MG PO TABS: 12.5000 mg | ORAL_TABLET | Freq: Two times a day (BID) | ORAL | 0 refills | Status: DC | PRN

## 2023-11-26 NOTE — Discharge Instructions (Addendum)
 Your x-ray showed some fluid has buildup around the right lung.  Please discuss this with your dialysis doctor.  You may take meclizine  as often as twice a day as needed for your dizziness.  Return if you start running a fever or if your symptoms are getting worse.

## 2023-11-26 NOTE — ED Provider Notes (Signed)
 [place] EMERGENCY DEPARTMENT AT [facility] Provider Note   CSN: [id number] Arrival date & time: 11/26/23  0159     History  Chief Complaint  Patient presents with   Emesis    [name] is a 33 y.o. female.  The history is provided by the patient.  Emesis She has history of hypertension, end-stage renal disease on hemodialysis, spina bifida, bilateral above-the-knee amputations and comes in because her heart rate was high and blood pressure was low at home.  She states that for the last week she has had a nonproductive cough.  She denies fever, chills, sweats.  She denies dyspnea or chest pain.  She does endorse some bodyaches.  For the last 2 days, she has had dizziness which she describes as a spinning sensation.  Dizziness is worse when she sits up.  She did have nausea and vomiting yesterday, none today.   Home Medications Prior to Admission medications   Medication Sig Start Date End Date Taking? Authorizing Provider  meclizine  (ANTIVERT ) 12.5 MG tablet Take 1 tablet (12.5 mg total) by mouth 2 (two) times daily as needed for dizziness. 11/26/23  Yes Alissa April, MD  acetaminophen  (TYLENOL ) 500 MG tablet Take 1,000 mg by mouth every 6 (six) hours as needed for mild pain or headache.    [provider]  albuterol  (PROVENTIL ) (2.5 MG/3ML) 0.083% nebulizer solution Take 3 mLs (2.5 mg total) by nebulization every 6 (six) hours as needed for wheezing or shortness of breath. 07/28/23   Austine Lefort, MD  albuterol  (VENTOLIN  HFA) 108 (90 Base) MCG/ACT inhaler Inhale 1-2 puffs into the lungs every 6 (six) hours as needed for wheezing or shortness of breath. 09/15/22   Jenelle Mis, FNP  AURYXIA  1 GM 210 MG(Fe) tablet Take 420 mg by mouth 2 (two) times daily with a meal. 04/15/18   [provider]  B Complex-C-Folic Acid  (DIALYVITE  TABLET) TABS Take 1 tablet by mouth daily.    [provider]  diphenhydrAMINE  (BENADRYL ) 25 mg capsule Take 25  mg by mouth daily as needed for allergies.    [provider]  EPINEPHrine  0.3 mg/0.3 mL IJ SOAJ injection Inject 0.3 mg into the muscle as needed for anaphylaxis. 04/23/23   Jenelle Mis, FNP  metoprolol  succinate (TOPROL -XL) 100 MG 24 hr tablet Take 100 mg by mouth daily. 12/29/19   [provider]  omeprazole (PRILOSEC) 40 MG capsule Take 40 mg by mouth in the morning and at bedtime. 01/08/22   [provider]  sodium zirconium cyclosilicate  (LOKELMA ) 5 g packet Take 10 g by mouth every Monday, Wednesday, and Friday. 07/20/21   [provider]      Allergies    Ciprofloxacin ; Other; Peanut -containing drug products; Aleve [naproxen sodium]; Ceftriaxone ; Coconut (cocos nucifera); Influenza vaccines; Tetanus toxoid, adsorbed; Tetanus toxoids; and Latex    Review of Systems   Review of Systems  Gastrointestinal:  Positive for vomiting.  All other systems reviewed and are negative.   Physical Exam Updated Vital Signs BP 113/88   Pulse 97   Temp 97.7 F (36.5 C) (Oral)   Resp (!) 25   Ht 3\' 1"  (0.94 m)   Wt 33.3 kg   LMP  (LMP Unknown)   SpO2 99%   BMI 37.70 kg/m  Physical Exam Vitals and nursing note reviewed.   33 year old female, resting comfortably and in no acute distress. Vital signs are significant for elevated heart rate and respiratory rate.  Oxygen  saturation is 95%, which is normal. Head is normocephalic and atraumatic. PERRLA, EOMI. Neck is nontender and supple without adenopathy. Lungs are clear without rales, wheezes, or rhonchi. Chest is nontender. Heart has regular rate and rhythm with 2-3/6 systolic ejection murmur. Abdomen is soft, flat, nontender. Extremities: Bilateral above-the-knee amputations.  AV fistula present left upper arm with thrill present. Skin is warm and dry without rash. Neurologic: Awake and alert, moves all extremities equally.  Dizziness is reproduced by testing for extraocular movements and also by passive  head movement.  ED Results / Procedures / Treatments   Labs (all labs ordered are listed, but only abnormal results are displayed) Labs Reviewed  BASIC METABOLIC PANEL WITH GFR - Abnormal; Notable for the following components:      Result Value   Chloride 96 (*)    Creatinine, Ser 4.10 (*)    GFR, Estimated 14 (*)    All other components within normal limits  CBC WITH DIFFERENTIAL/PLATELET - Abnormal; Notable for the following components:   RBC 3.69 (*)    Hemoglobin 11.5 (*)    MCV 102.2 (*)    RDW 15.6 (*)    Platelets 94 (*)    All other components within normal limits  RESP PANEL BY RT-PCR (RSV, FLU A&B, COVID)  [id number]   Radiology DG Chest Port 1 View Result Date: 11/26/2023 CLINICAL DATA:  Cough EXAM: PORTABLE CHEST 1 VIEW COMPARISON:  07/26/2023 FINDINGS: Right basilar collapse and consolidation. Small right pleural effusion is suspected, likely partially loculated within the fissure. No pneumothorax. No pleural effusion the left. Stable cardiomegaly. IMPRESSION: 1. Right basilar collapse and consolidation. Small right pleural effusion. 2. Stable cardiomegaly. Electronically Signed   By: Worthy Heads M.D.   On: 11/26/2023 02:55    Procedures Procedures  Cardiac monitor shows normal sinus rhythm, per my interpretation.  Medications Ordered in ED Medications  meclizine  (ANTIVERT ) tablet 25 mg (25 mg Oral Given 11/26/23 0251)    ED Course/ Medical Decision Making/ A&P                                 Medical Decision Making Amount and/or Complexity of Data Reviewed Labs: ordered. Radiology: ordered.   Dizziness which appears to be peripheral vertigo.  No red flags to suggest central vertigo.  Cough and bodyaches concerning for viral illness such as RSV or influenza or COVID-19, doubt pneumonia.  I have ordered a chest x-ray to rule out pneumonia.  Have ordered screening labs of CBC and basic metabolic panel.  I have ordered a dose of meclizine  for her vertigo.  Vertigo  has completely resolved following meclizine .  I have reviewed her laboratory test, my interpretation is negative PCR for influenza and COVID-19 and RSV, mild anemia unchanged from baseline and most likely secondary to chronic renal failure, elevated creatinine consistent with known end-stage renal disease.  Chest x-ray as right basilar collapse and right pleural effusion with stable cardiomegaly.  I have independently reviewed the images, and generally agree with the radiologist's interpretation however I think that the effusion is at least moderate and significantly increased compared with most recent chest x-ray in December.  I am discharging her with a prescription for meclizine  and she is to continue with her usual dialysis regimen.  Final Clinical Impression(s) / ED Diagnoses Final diagnoses:  Vertigo  End-stage renal disease on hemodialysis (HCC)  Pleural effusion on right  Anemia associated with chronic  renal failure    Rx / DC Orders ED Discharge Orders          Ordered    meclizine  (ANTIVERT ) 12.5 MG tablet  2 times daily PRN        11/26/23 0718              Alissa April, MD 11/26/23 [phone]

## 2023-11-26 NOTE — ED Notes (Signed)
 Patient left in stable condition, AOX4, with PTAR and her belongings.

## 2023-11-26 NOTE — ED Triage Notes (Signed)
 Pt arrived from home via [facility] c/o feeling dizzy s/p dialysis today. Pt states that it feels like the room is spinning.

## 2023-11-27 DIAGNOSIS — Z992 Dependence on renal dialysis: Secondary | ICD-10-CM | POA: Diagnosis not present

## 2023-11-27 DIAGNOSIS — N186 End stage renal disease: Secondary | ICD-10-CM | POA: Diagnosis not present

## 2023-11-27 DIAGNOSIS — N2581 Secondary hyperparathyroidism of renal origin: Secondary | ICD-10-CM | POA: Diagnosis not present

## 2023-11-27 DIAGNOSIS — E162 Hypoglycemia, unspecified: Secondary | ICD-10-CM | POA: Diagnosis not present

## 2023-11-30 DIAGNOSIS — E162 Hypoglycemia, unspecified: Secondary | ICD-10-CM | POA: Diagnosis not present

## 2023-11-30 DIAGNOSIS — N186 End stage renal disease: Secondary | ICD-10-CM | POA: Diagnosis not present

## 2023-11-30 DIAGNOSIS — N2581 Secondary hyperparathyroidism of renal origin: Secondary | ICD-10-CM | POA: Diagnosis not present

## 2023-11-30 DIAGNOSIS — Z992 Dependence on renal dialysis: Secondary | ICD-10-CM | POA: Diagnosis not present

## 2023-12-02 DIAGNOSIS — N186 End stage renal disease: Secondary | ICD-10-CM | POA: Diagnosis not present

## 2023-12-02 DIAGNOSIS — N2581 Secondary hyperparathyroidism of renal origin: Secondary | ICD-10-CM | POA: Diagnosis not present

## 2023-12-02 DIAGNOSIS — E162 Hypoglycemia, unspecified: Secondary | ICD-10-CM | POA: Diagnosis not present

## 2023-12-02 DIAGNOSIS — Z992 Dependence on renal dialysis: Secondary | ICD-10-CM | POA: Diagnosis not present

## 2023-12-04 DIAGNOSIS — N2581 Secondary hyperparathyroidism of renal origin: Secondary | ICD-10-CM | POA: Diagnosis not present

## 2023-12-04 DIAGNOSIS — E162 Hypoglycemia, unspecified: Secondary | ICD-10-CM | POA: Diagnosis not present

## 2023-12-04 DIAGNOSIS — Z992 Dependence on renal dialysis: Secondary | ICD-10-CM | POA: Diagnosis not present

## 2023-12-04 DIAGNOSIS — N186 End stage renal disease: Secondary | ICD-10-CM | POA: Diagnosis not present

## 2023-12-06 ENCOUNTER — Ambulatory Visit (HOSPITAL_BASED_OUTPATIENT_CLINIC_OR_DEPARTMENT_OTHER): Attending: Cardiology | Admitting: Cardiology

## 2023-12-06 VITALS — Ht <= 58 in | Wt 79.0 lb

## 2023-12-06 DIAGNOSIS — N186 End stage renal disease: Secondary | ICD-10-CM

## 2023-12-06 DIAGNOSIS — G4733 Obstructive sleep apnea (adult) (pediatric): Secondary | ICD-10-CM | POA: Diagnosis not present

## 2023-12-06 DIAGNOSIS — I429 Cardiomyopathy, unspecified: Secondary | ICD-10-CM

## 2023-12-06 DIAGNOSIS — I10 Essential (primary) hypertension: Secondary | ICD-10-CM

## 2023-12-06 DIAGNOSIS — I5022 Chronic systolic (congestive) heart failure: Secondary | ICD-10-CM

## 2023-12-06 DIAGNOSIS — I050 Rheumatic mitral stenosis: Secondary | ICD-10-CM

## 2023-12-06 DIAGNOSIS — Q057 Lumbar spina bifida without hydrocephalus: Secondary | ICD-10-CM

## 2023-12-06 DIAGNOSIS — R931 Abnormal findings on diagnostic imaging of heart and coronary circulation: Secondary | ICD-10-CM

## 2023-12-06 DIAGNOSIS — M79601 Pain in right arm: Secondary | ICD-10-CM

## 2023-12-07 DIAGNOSIS — E162 Hypoglycemia, unspecified: Secondary | ICD-10-CM | POA: Diagnosis not present

## 2023-12-07 DIAGNOSIS — N186 End stage renal disease: Secondary | ICD-10-CM | POA: Diagnosis not present

## 2023-12-07 DIAGNOSIS — N2581 Secondary hyperparathyroidism of renal origin: Secondary | ICD-10-CM | POA: Diagnosis not present

## 2023-12-07 DIAGNOSIS — Z992 Dependence on renal dialysis: Secondary | ICD-10-CM | POA: Diagnosis not present

## 2023-12-07 NOTE — Procedures (Signed)
   [facility] [address] [place], [place] [address] Tel: [phone]   Fax: [phone]  Titration Interpretation  Patient Name:  [name], [name] Date:  12/06/2023 Referring Physician:  Peder Bourdon, MD  Indications for Polysomnography The patient is a 33 year old Female who is 3\' 1"  and weighs 79.0 lbs. Her BMI equals 40.6.  A full night titration treatment study was performed.  Medication  No Data.   Polysomnogram Data A full night polysomnogram recorded the standard physiologic parameters including EEG, EOG, EMG, EKG, nasal and oral airflow.  Respiratory parameters of chest and abdominal movements were recorded with Respiratory Inductance Plethysmography belts.  Oxygen  saturation was recorded by pulse oximetry.   Sleep Architecture The total recording time of the polysomnogram was 364.5 minutes.  The total sleep time was 180.0 minutes.  The patient spent 16.1% of total sleep time in Stage N1, 58.6% in Stage N2, 3.3% in Stages N3, and 21.9% in REM.  Sleep latency was 142.7 minutes.  REM latency was 60.0 minutes.  Sleep Efficiency was 49.4%.  Wake after Sleep Onset time was 41.5 minutes.  Titration Summary The patient was titrated at pressures ranging from 7/3 cm/H20 up to 14/8 cm/H20 with supplemental oxygen  at O2: 4L.  The last pressure used in the study was 10/6 cm/H20 with supplemental oxygen  at O2: 4L.  Respiratory Events The polysomnogram revealed a presence of 1 obstructive, 3 centrals, and 0 mixed apneas resulting in an Apnea index of 1.3 events per hour.  There were 42 hypopneas (>=3% desaturation and/or arousal) resulting in an Apnea\Hypopnea Index (AHI >=3% desaturation and/or arousal) of 15.3 events per hour.  There were 34 hypopneas (>=4% desaturation) resulting in an Apnea\Hypopnea Index (AHI >=4% desaturation) of 12.7 events per hour.  There were 12 Respiratory Effort Related Arousals resulting in a RERA index of 4.0 events  per hour. The Respiratory Disturbance Index is 19.3 events per hour.  The snore index was 0 events per hour.  Mean oxygen  saturation was 89.5%.  The lowest oxygen  saturation during sleep was 79.0%.  Time spent <=88% oxygen  saturation was 133.0 minutes (39.4%).  Limb Activity There were 1 limb movements recorded.  Of this total, 0 were classified as PLMs.  Of the PLMs, 0 were associated with arousals.  The Limb Movement index was 0.3 per hour while the PLM index was 0 per hour.  Cardiac Summary The average pulse rate was 77.8 bpm.  The minimum pulse rate was 42.0 bpm while the maximum pulse rate was 93.0 bpm.  Cardiac rhythm was normal.  Diagnosis:  Obstructive Sleep Apnea Nocturnal Hypoxemia  Recommendations: Recommend a trial of ResMed BiPAP at 13/9cm H2O with heated humidity, small ResMed Airfit F10 mask.  Add O2 at 4L via BiPAP nightly. ONO on BiPAP and O2 The patient should be counseled in good sleep hygiene and avoid sleeping in the supine position. Encourage patient to avoid driving when sleepy. Followup in Sleep Medicine clinic in 6 weeks.   This study was personally reviewed and electronically signed by: Gaylyn Keas Accredited Board Certified in Sleep Medicine Date/Time: 12/05/2023 9:27PM

## 2023-12-09 DIAGNOSIS — N2581 Secondary hyperparathyroidism of renal origin: Secondary | ICD-10-CM | POA: Diagnosis not present

## 2023-12-09 DIAGNOSIS — E162 Hypoglycemia, unspecified: Secondary | ICD-10-CM | POA: Diagnosis not present

## 2023-12-09 DIAGNOSIS — N186 End stage renal disease: Secondary | ICD-10-CM | POA: Diagnosis not present

## 2023-12-09 DIAGNOSIS — Z992 Dependence on renal dialysis: Secondary | ICD-10-CM | POA: Diagnosis not present

## 2023-12-11 DIAGNOSIS — N2581 Secondary hyperparathyroidism of renal origin: Secondary | ICD-10-CM | POA: Diagnosis not present

## 2023-12-11 DIAGNOSIS — E162 Hypoglycemia, unspecified: Secondary | ICD-10-CM | POA: Diagnosis not present

## 2023-12-11 DIAGNOSIS — N186 End stage renal disease: Secondary | ICD-10-CM | POA: Diagnosis not present

## 2023-12-11 DIAGNOSIS — Z992 Dependence on renal dialysis: Secondary | ICD-10-CM | POA: Diagnosis not present

## 2023-12-14 DIAGNOSIS — E162 Hypoglycemia, unspecified: Secondary | ICD-10-CM | POA: Diagnosis not present

## 2023-12-14 DIAGNOSIS — N2581 Secondary hyperparathyroidism of renal origin: Secondary | ICD-10-CM | POA: Diagnosis not present

## 2023-12-14 DIAGNOSIS — N186 End stage renal disease: Secondary | ICD-10-CM | POA: Diagnosis not present

## 2023-12-14 DIAGNOSIS — Z992 Dependence on renal dialysis: Secondary | ICD-10-CM | POA: Diagnosis not present

## 2023-12-16 DIAGNOSIS — E162 Hypoglycemia, unspecified: Secondary | ICD-10-CM | POA: Diagnosis not present

## 2023-12-16 DIAGNOSIS — N2581 Secondary hyperparathyroidism of renal origin: Secondary | ICD-10-CM | POA: Diagnosis not present

## 2023-12-16 DIAGNOSIS — Z992 Dependence on renal dialysis: Secondary | ICD-10-CM | POA: Diagnosis not present

## 2023-12-16 DIAGNOSIS — N186 End stage renal disease: Secondary | ICD-10-CM | POA: Diagnosis not present

## 2023-12-18 DIAGNOSIS — E162 Hypoglycemia, unspecified: Secondary | ICD-10-CM | POA: Diagnosis not present

## 2023-12-18 DIAGNOSIS — N2581 Secondary hyperparathyroidism of renal origin: Secondary | ICD-10-CM | POA: Diagnosis not present

## 2023-12-18 DIAGNOSIS — N186 End stage renal disease: Secondary | ICD-10-CM | POA: Diagnosis not present

## 2023-12-18 DIAGNOSIS — Z992 Dependence on renal dialysis: Secondary | ICD-10-CM | POA: Diagnosis not present

## 2023-12-21 DIAGNOSIS — E162 Hypoglycemia, unspecified: Secondary | ICD-10-CM | POA: Diagnosis not present

## 2023-12-21 DIAGNOSIS — N2581 Secondary hyperparathyroidism of renal origin: Secondary | ICD-10-CM | POA: Diagnosis not present

## 2023-12-21 DIAGNOSIS — Z992 Dependence on renal dialysis: Secondary | ICD-10-CM | POA: Diagnosis not present

## 2023-12-21 DIAGNOSIS — N186 End stage renal disease: Secondary | ICD-10-CM | POA: Diagnosis not present

## 2023-12-23 DIAGNOSIS — E162 Hypoglycemia, unspecified: Secondary | ICD-10-CM | POA: Diagnosis not present

## 2023-12-23 DIAGNOSIS — N186 End stage renal disease: Secondary | ICD-10-CM | POA: Diagnosis not present

## 2023-12-23 DIAGNOSIS — N2581 Secondary hyperparathyroidism of renal origin: Secondary | ICD-10-CM | POA: Diagnosis not present

## 2023-12-23 DIAGNOSIS — Z992 Dependence on renal dialysis: Secondary | ICD-10-CM | POA: Diagnosis not present

## 2023-12-25 DIAGNOSIS — Z992 Dependence on renal dialysis: Secondary | ICD-10-CM | POA: Diagnosis not present

## 2023-12-25 DIAGNOSIS — N2581 Secondary hyperparathyroidism of renal origin: Secondary | ICD-10-CM | POA: Diagnosis not present

## 2023-12-25 DIAGNOSIS — N186 End stage renal disease: Secondary | ICD-10-CM | POA: Diagnosis not present

## 2023-12-25 DIAGNOSIS — E162 Hypoglycemia, unspecified: Secondary | ICD-10-CM | POA: Diagnosis not present

## 2023-12-27 DIAGNOSIS — Z992 Dependence on renal dialysis: Secondary | ICD-10-CM | POA: Diagnosis not present

## 2023-12-27 DIAGNOSIS — I120 Hypertensive chronic kidney disease with stage 5 chronic kidney disease or end stage renal disease: Secondary | ICD-10-CM | POA: Diagnosis not present

## 2023-12-27 DIAGNOSIS — N186 End stage renal disease: Secondary | ICD-10-CM | POA: Diagnosis not present

## 2023-12-28 ENCOUNTER — Telehealth: Payer: Self-pay | Admitting: Cardiology

## 2023-12-28 DIAGNOSIS — N186 End stage renal disease: Secondary | ICD-10-CM | POA: Diagnosis not present

## 2023-12-28 DIAGNOSIS — Z992 Dependence on renal dialysis: Secondary | ICD-10-CM | POA: Diagnosis not present

## 2023-12-28 DIAGNOSIS — Z0001 Encounter for general adult medical examination with abnormal findings: Secondary | ICD-10-CM | POA: Diagnosis not present

## 2023-12-28 DIAGNOSIS — I5032 Chronic diastolic (congestive) heart failure: Secondary | ICD-10-CM | POA: Diagnosis not present

## 2023-12-28 DIAGNOSIS — E8770 Fluid overload, unspecified: Secondary | ICD-10-CM | POA: Diagnosis not present

## 2023-12-28 DIAGNOSIS — N2581 Secondary hyperparathyroidism of renal origin: Secondary | ICD-10-CM | POA: Diagnosis not present

## 2023-12-28 DIAGNOSIS — F322 Major depressive disorder, single episode, severe without psychotic features: Secondary | ICD-10-CM | POA: Diagnosis not present

## 2023-12-28 DIAGNOSIS — I130 Hypertensive heart and chronic kidney disease with heart failure and stage 1 through stage 4 chronic kidney disease, or unspecified chronic kidney disease: Secondary | ICD-10-CM | POA: Diagnosis not present

## 2023-12-28 DIAGNOSIS — G4733 Obstructive sleep apnea (adult) (pediatric): Secondary | ICD-10-CM

## 2023-12-28 DIAGNOSIS — Q7649 Other congenital malformations of spine, not associated with scoliosis: Secondary | ICD-10-CM | POA: Diagnosis not present

## 2023-12-28 DIAGNOSIS — Q059 Spina bifida, unspecified: Secondary | ICD-10-CM | POA: Diagnosis not present

## 2023-12-28 DIAGNOSIS — I5022 Chronic systolic (congestive) heart failure: Secondary | ICD-10-CM

## 2023-12-28 DIAGNOSIS — I10 Essential (primary) hypertension: Secondary | ICD-10-CM

## 2023-12-28 DIAGNOSIS — D631 Anemia in chronic kidney disease: Secondary | ICD-10-CM | POA: Diagnosis not present

## 2023-12-28 NOTE — Telephone Encounter (Signed)
 What problem are you experiencing?   Who is your medical equipment company?   3)    If patient is calling about their sleep study results please route to [facility] Sleep Study Pool. YES  Please route to the sleep study coordinator.

## 2023-12-30 ENCOUNTER — Emergency Department (HOSPITAL_COMMUNITY)

## 2023-12-30 ENCOUNTER — Other Ambulatory Visit: Payer: Self-pay

## 2023-12-30 ENCOUNTER — Inpatient Hospital Stay (HOSPITAL_COMMUNITY)
Admission: EM | Admit: 2023-12-30 | Discharge: 2024-01-04 | DRG: 291 | Disposition: A | Discharge status: Home / Self Care | Attending: Internal Medicine | Admitting: Internal Medicine

## 2023-12-30 ENCOUNTER — Encounter (HOSPITAL_COMMUNITY): Payer: Self-pay | Admitting: Hospitalist

## 2023-12-30 DIAGNOSIS — Z89611 Acquired absence of right leg above knee: Secondary | ICD-10-CM | POA: Diagnosis not present

## 2023-12-30 DIAGNOSIS — N2581 Secondary hyperparathyroidism of renal origin: Secondary | ICD-10-CM | POA: Diagnosis present

## 2023-12-30 DIAGNOSIS — Z89612 Acquired absence of left leg above knee: Secondary | ICD-10-CM

## 2023-12-30 DIAGNOSIS — I272 Pulmonary hypertension, unspecified: Secondary | ICD-10-CM | POA: Diagnosis present

## 2023-12-30 DIAGNOSIS — R5383 Other fatigue: Secondary | ICD-10-CM | POA: Diagnosis not present

## 2023-12-30 DIAGNOSIS — I3139 Other pericardial effusion (noninflammatory): Secondary | ICD-10-CM | POA: Diagnosis not present

## 2023-12-30 DIAGNOSIS — R188 Other ascites: Secondary | ICD-10-CM | POA: Diagnosis not present

## 2023-12-30 DIAGNOSIS — F17210 Nicotine dependence, cigarettes, uncomplicated: Secondary | ICD-10-CM | POA: Diagnosis present

## 2023-12-30 DIAGNOSIS — I517 Cardiomegaly: Secondary | ICD-10-CM | POA: Diagnosis not present

## 2023-12-30 DIAGNOSIS — Z938 Other artificial opening status: Secondary | ICD-10-CM

## 2023-12-30 DIAGNOSIS — J90 Pleural effusion, not elsewhere classified: Secondary | ICD-10-CM | POA: Diagnosis present

## 2023-12-30 DIAGNOSIS — Z7401 Bed confinement status: Secondary | ICD-10-CM | POA: Diagnosis not present

## 2023-12-30 DIAGNOSIS — J810 Acute pulmonary edema: Secondary | ICD-10-CM | POA: Diagnosis not present

## 2023-12-30 DIAGNOSIS — Z993 Dependence on wheelchair: Secondary | ICD-10-CM

## 2023-12-30 DIAGNOSIS — Q059 Spina bifida, unspecified: Secondary | ICD-10-CM

## 2023-12-30 DIAGNOSIS — I132 Hypertensive heart and chronic kidney disease with heart failure and with stage 5 chronic kidney disease, or end stage renal disease: Secondary | ICD-10-CM | POA: Diagnosis not present

## 2023-12-30 DIAGNOSIS — D631 Anemia in chronic kidney disease: Secondary | ICD-10-CM | POA: Diagnosis present

## 2023-12-30 DIAGNOSIS — R531 Weakness: Secondary | ICD-10-CM | POA: Diagnosis not present

## 2023-12-30 DIAGNOSIS — I2722 Pulmonary hypertension due to left heart disease: Secondary | ICD-10-CM | POA: Diagnosis not present

## 2023-12-30 DIAGNOSIS — Z79899 Other long term (current) drug therapy: Secondary | ICD-10-CM | POA: Diagnosis not present

## 2023-12-30 DIAGNOSIS — Z992 Dependence on renal dialysis: Secondary | ICD-10-CM

## 2023-12-30 DIAGNOSIS — R0602 Shortness of breath: Principal | ICD-10-CM

## 2023-12-30 DIAGNOSIS — I120 Hypertensive chronic kidney disease with stage 5 chronic kidney disease or end stage renal disease: Secondary | ICD-10-CM | POA: Diagnosis not present

## 2023-12-30 DIAGNOSIS — J939 Pneumothorax, unspecified: Secondary | ICD-10-CM | POA: Diagnosis present

## 2023-12-30 DIAGNOSIS — Z881 Allergy status to other antibiotic agents status: Secondary | ICD-10-CM | POA: Diagnosis not present

## 2023-12-30 DIAGNOSIS — J984 Other disorders of lung: Secondary | ICD-10-CM | POA: Diagnosis not present

## 2023-12-30 DIAGNOSIS — I10 Essential (primary) hypertension: Secondary | ICD-10-CM | POA: Diagnosis not present

## 2023-12-30 DIAGNOSIS — Z91040 Latex allergy status: Secondary | ICD-10-CM | POA: Diagnosis not present

## 2023-12-30 DIAGNOSIS — I5042 Chronic combined systolic (congestive) and diastolic (congestive) heart failure: Secondary | ICD-10-CM | POA: Diagnosis present

## 2023-12-30 DIAGNOSIS — N186 End stage renal disease: Secondary | ICD-10-CM

## 2023-12-30 DIAGNOSIS — Z8249 Family history of ischemic heart disease and other diseases of the circulatory system: Secondary | ICD-10-CM | POA: Diagnosis not present

## 2023-12-30 DIAGNOSIS — G4733 Obstructive sleep apnea (adult) (pediatric): Secondary | ICD-10-CM | POA: Diagnosis present

## 2023-12-30 DIAGNOSIS — Q057 Lumbar spina bifida without hydrocephalus: Secondary | ICD-10-CM | POA: Diagnosis not present

## 2023-12-30 DIAGNOSIS — R918 Other nonspecific abnormal finding of lung field: Secondary | ICD-10-CM | POA: Diagnosis not present

## 2023-12-30 DIAGNOSIS — R000 Tachycardia, unspecified: Secondary | ICD-10-CM | POA: Diagnosis present

## 2023-12-30 DIAGNOSIS — I050 Rheumatic mitral stenosis: Secondary | ICD-10-CM | POA: Diagnosis present

## 2023-12-30 LAB — BASIC METABOLIC PANEL WITH GFR
Anion gap: 19 — ABNORMAL HIGH (ref 5–15)
BUN: 42 mg/dL — ABNORMAL HIGH (ref 6–20)
CO2: 24 mmol/L (ref 22–32)
Calcium: 7.7 mg/dL — ABNORMAL LOW (ref 8.9–10.3)
Chloride: 97 mmol/L — ABNORMAL LOW (ref 98–111)
Creatinine, Ser: 6.41 mg/dL — ABNORMAL HIGH (ref 0.44–1.00)
GFR, Estimated: 8 mL/min — ABNORMAL LOW (ref >60)
Glucose, Bld: 88 mg/dL (ref 70–99)
Potassium: 4.3 mmol/L (ref 3.5–5.1)
Sodium: 140 mmol/L (ref 135–145)

## 2023-12-30 LAB — CBC
HCT: 39.1 % (ref 36.0–46.0)
HCT: 36.9 % (ref 36.0–46.0)
Hemoglobin: 11.7 g/dL — ABNORMAL LOW (ref 12.0–15.0)
Hemoglobin: 11.3 g/dL — ABNORMAL LOW (ref 12.0–15.0)
MCH: 30.2 pg (ref 26.0–34.0)
MCH: 30.2 pg (ref 26.0–34.0)
MCHC: 29.9 g/dL — ABNORMAL LOW (ref 30.0–36.0)
MCHC: 30.6 g/dL (ref 30.0–36.0)
MCV: 100.8 fL — ABNORMAL HIGH (ref 80.0–100.0)
MCV: 98.7 fL (ref 80.0–100.0)
Platelets: 132 10*3/uL — ABNORMAL LOW (ref 150–400)
Platelets: 125 10*3/uL — ABNORMAL LOW (ref 150–400)
RBC: 3.88 MIL/uL (ref 3.87–5.11)
RBC: 3.74 MIL/uL — ABNORMAL LOW (ref 3.87–5.11)
RDW: 16.1 % — ABNORMAL HIGH (ref 11.5–15.5)
RDW: 16 % — ABNORMAL HIGH (ref 11.5–15.5)
WBC: 7.1 10*3/uL (ref 4.0–10.5)
WBC: 5.4 10*3/uL (ref 4.0–10.5)
nRBC: 0 % (ref 0.0–0.2)
nRBC: 0 % (ref 0.0–0.2)

## 2023-12-30 LAB — CREATININE, SERUM
Creatinine, Ser: 3.13 mg/dL — ABNORMAL HIGH (ref 0.44–1.00)
GFR, Estimated: 20 mL/min — ABNORMAL LOW (ref >60)

## 2023-12-30 LAB — TROPONIN I (HIGH SENSITIVITY)
Troponin I (High Sensitivity): 79 ng/L — ABNORMAL HIGH (ref <18)
Troponin I (High Sensitivity): 78 ng/L — ABNORMAL HIGH (ref <18)

## 2023-12-30 LAB — HEPATIC FUNCTION PANEL
ALT: 16 U/L (ref 0–44)
AST: 26 U/L (ref 15–41)
Albumin: 3 g/dL — ABNORMAL LOW (ref 3.5–5.0)
Alkaline Phosphatase: 114 U/L (ref 38–126)
Bilirubin, Direct: 0.4 mg/dL — ABNORMAL HIGH (ref 0.0–0.2)
Indirect Bilirubin: 0.8 mg/dL (ref 0.3–0.9)
Total Bilirubin: 1.2 mg/dL (ref 0.0–1.2)
Total Protein: 6.8 g/dL (ref 6.5–8.1)

## 2023-12-30 LAB — BRAIN NATRIURETIC PEPTIDE: B Natriuretic Peptide: 1039.6 pg/mL — ABNORMAL HIGH (ref 0.0–100.0)

## 2023-12-30 LAB — HEPATITIS B SURFACE ANTIGEN: Hepatitis B Surface Ag: NONREACTIVE

## 2023-12-30 LAB — GLUCOSE, CAPILLARY: Glucose-Capillary: 75 mg/dL (ref 70–99)

## 2023-12-30 LAB — PROCALCITONIN: Procalcitonin: 0.96 ng/mL

## 2023-12-30 MED ORDER — FERRIC CITRATE 1 GM 210 MG(FE) PO TABS
420.0000 mg | ORAL_TABLET | Freq: Two times a day (BID) | ORAL | Status: DC
  Administered 2023-12-31 – 2024-01-03 (×4): 420 mg via ORAL
  Filled 2023-12-30 (×10): qty 2

## 2023-12-30 MED ORDER — CHLORHEXIDINE GLUCONATE CLOTH 2 % EX PADS
6.0000 | MEDICATED_PAD | Freq: Every day | CUTANEOUS | Status: DC
  Administered 2023-12-31: 6 via TOPICAL

## 2023-12-30 MED ORDER — PANTOPRAZOLE SODIUM 40 MG PO TBEC
40.0000 mg | DELAYED_RELEASE_TABLET | Freq: Every day | ORAL | Status: DC
  Administered 2023-12-31 – 2024-01-02 (×2): 40 mg via ORAL
  Filled 2023-12-30 (×3): qty 1

## 2023-12-30 MED ORDER — RENA-VITE PO TABS
1.0000 | ORAL_TABLET | Freq: Every day | ORAL | Status: DC
  Administered 2023-12-31 – 2024-01-02 (×2): 1 via ORAL
  Filled 2023-12-30 (×4): qty 1

## 2023-12-30 MED ORDER — IOHEXOL 350 MG/ML SOLN
50.0000 mL | Freq: Once | INTRAVENOUS | Status: AC | PRN
  Administered 2023-12-30: 50 mL via INTRAVENOUS

## 2023-12-30 MED ORDER — METOPROLOL SUCCINATE ER 50 MG PO TB24
100.0000 mg | ORAL_TABLET | Freq: Every day | ORAL | Status: DC
  Administered 2023-12-31 – 2024-01-02 (×2): 100 mg via ORAL
  Filled 2023-12-30: qty 4
  Filled 2023-12-30 (×2): qty 2

## 2023-12-30 MED ORDER — IPRATROPIUM-ALBUTEROL 0.5-2.5 (3) MG/3ML IN SOLN
3.0000 mL | Freq: Four times a day (QID) | RESPIRATORY_TRACT | Status: DC | PRN
Start: 2023-12-30 — End: 2024-01-05

## 2023-12-30 MED ORDER — SODIUM ZIRCONIUM CYCLOSILICATE 10 G PO PACK: 10.0000 g | PACK | ORAL | Status: DC

## 2023-12-30 MED ORDER — ONDANSETRON HCL 4 MG PO TABS: 4.0000 mg | ORAL_TABLET | Freq: Three times a day (TID) | ORAL | Status: DC | PRN

## 2023-12-30 MED ORDER — HEPARIN SODIUM (PORCINE) 5000 UNIT/ML IJ SOLN
5000.0000 [IU] | Freq: Three times a day (TID) | INTRAMUSCULAR | Status: DC
  Filled 2023-12-30 (×2): qty 1

## 2023-12-30 NOTE — ED Provider Notes (Signed)
 Care assumed from [facility] [place] at shift change. Please see their note for further information. Physical Exam  BP 121/80   Pulse (!) 121   Temp 98.5 F (36.9 C)   Resp 15   Ht 3\' 1"  (0.94 m)   Wt 36 kg   SpO2 97%   BMI 40.76 kg/m   Physical Exam Vitals and nursing note reviewed.  Constitutional:      General: She is not in acute distress.    Appearance: Normal appearance. She is normal weight. She is not toxic-appearing or diaphoretic.     Comments: Chronically ill appearing  HENT:     Head: Normocephalic and atraumatic.  Cardiovascular:     Rate and Rhythm: Normal rate.  Pulmonary:     Effort: Pulmonary effort is normal. No respiratory distress.     Comments: Speaking in complete sentences on room air Musculoskeletal:        General: Normal range of motion.     Cervical back: Normal range of motion.     Comments: Bilateral AKAs  Skin:    General: Skin is warm and dry.  Neurological:     General: No focal deficit present.     Mental Status: She is alert.  Psychiatric:        Mood and Affect: Mood normal.        Behavior: Behavior normal.    Procedures  Procedures  ED Course / MDM    Medical Decision Making Amount and/or Complexity of Data Reviewed Labs: ordered. Radiology: ordered.  Risk Prescription drug management. Decision regarding hospitalization.   Briefly: Patient ESRD on hemodialysis presents with shortness of breath. Last dialysis Monday. Initially on oxygen , however now maintaining on room air. Has regular scheduled dialysis at noon today. Given tachycardia, plan for PE study. Troponin also mildly elevated from baseline, will need second trop.   Patients PE study has resulted and reveals  1. No pulmonary embolism. 2. Large right pleural effusion with associated right lower lobe collapse and partial right middle lobe collapse. 3. Diffuse ground-glass attenuation within the aerated lungs, no mass lesion. This likely reflects edema.  I have  personally reviewed and interpreted this imaging and agree with radiology interpretation.  Upon reassessment of patient, she states that she is feeling better than she was when she got here, she is no longer having profound shortness of breath.  Also notes that she normally feels better after she is dialyzed.  However, upon discussion with nephrology on-call Dr. Zana Hesselbach he is requesting admission for management of this pleural effusion as it appears to be continually increasing in size and is likely contributing to her symptoms.  Also notes that the edema seen on her CT is new as well.  He plans to see the patient later this morning and facilitate inpatient dialysis.  Discussed patient with hospitalist Dr. Sulema Endo who accepts patient for admission.   Findings and plan of care discussed with supervising physician Dr. Ranelle Buys who is in agreement.    Sherra Dk, PA-C 12/30/23 1553    Sallyanne Creamer, DO 01/04/24 [phone]

## 2023-12-30 NOTE — Progress Notes (Signed)
   12/30/23 1858  Vitals  Temp 98.4 F (36.9 C)  Pulse Rate 84  Resp 16  BP 122/78  O2 Device Room Air  Weight (S)   (No weight Bed)  Type of Weight Post-Dialysis  Post Treatment  Dialyzer Clearance Clear  Hemodialysis Intake (mL) 0 mL  Liters Processed 57.8  Fluid Removed (mL) 2300 mL  Tolerated HD Treatment Yes  Post-Hemodialysis Comments Tx completed and UF goal was not obtained as previous order, D/T pt. complaint of cramping so UF goal was decrease as tolerated per. order.  AVG/AVF Arterial Site Held (minutes) 5 minutes  AVG/AVF Venous Site Held (minutes) 5 minutes   Received patient in bed to unit.  Alert and oriented.  Informed consent signed and in chart.   TX duration:3.5  Patient tolerated well.  Transported back to the room  Alert, without acute distress.  Hand-off given to patient's nurse.   Access used: Yes Access issues: No  Total UF removed: 2300 Medication(s) given: No Meds Given Post HD VS: See Above Grid Post HD weight: Pt. Is not in a Weight Bed   [facility]

## 2023-12-30 NOTE — ED Notes (Signed)
Pt transported to dialysis by transport

## 2023-12-30 NOTE — ED Provider Notes (Cosign Needed Addendum)
 [place] EMERGENCY DEPARTMENT AT [facility] Provider Note   CSN: [id number] Arrival date & time: 12/30/23  0405     History  Chief Complaint  Patient presents with   Shortness of Breath    [name] is a 33 y.o. female.  She is history of spina bifida, CHF, pulmonary hypertension, mitral stenosis, bilateral AKAs, ESRD on hemodialysis Monday Wednesday Friday.  She presents to the ER today for evaluation for shortness of breath that started several hours ago.  She states she feels like she has too much fluid and needs dialysis early.  She denies chest pain, denies fever.  Denies noncompliance with fluid restrictions.  She last dialyzed on Monday, and did her full session. She is due to dialyze at noon today.  She has not had fever or chills or cough.  No orthopnea    Shortness of Breath      Home Medications Prior to Admission medications   Medication Sig Start Date End Date Taking? Authorizing Provider  acetaminophen  (TYLENOL ) 500 MG tablet Take 1,000 mg by mouth every 6 (six) hours as needed for mild pain or headache.    [provider]  albuterol  (PROVENTIL ) (2.5 MG/3ML) 0.083% nebulizer solution Take 3 mLs (2.5 mg total) by nebulization every 6 (six) hours as needed for wheezing or shortness of breath. 07/28/23   Austine Lefort, MD  albuterol  (VENTOLIN  HFA) 108 (90 Base) MCG/ACT inhaler Inhale 1-2 puffs into the lungs every 6 (six) hours as needed for wheezing or shortness of breath. 09/15/22   Jenelle Mis, FNP  AURYXIA  1 GM 210 MG(Fe) tablet Take 420 mg by mouth 2 (two) times daily with a meal. 04/15/18   [provider]  B Complex-C-Folic Acid  (DIALYVITE  TABLET) TABS Take 1 tablet by mouth daily.    [provider]  diphenhydrAMINE  (BENADRYL ) 25 mg capsule Take 25 mg by mouth daily as needed for allergies.    [provider]  EPINEPHrine  0.3 mg/0.3 mL IJ SOAJ injection Inject 0.3 mg into the muscle as needed for  anaphylaxis. 04/23/23   Jenelle Mis, FNP  meclizine  (ANTIVERT ) 12.5 MG tablet Take 1 tablet (12.5 mg total) by mouth 2 (two) times daily as needed for dizziness. 11/26/23   Alissa April, MD  metoprolol  succinate (TOPROL -XL) 100 MG 24 hr tablet Take 100 mg by mouth daily. 12/29/19   [provider]  omeprazole (PRILOSEC) 40 MG capsule Take 40 mg by mouth in the morning and at bedtime. 01/08/22   [provider]  sodium zirconium cyclosilicate  (LOKELMA ) 5 g packet Take 10 g by mouth every Monday, Wednesday, and Friday. 07/20/21   [provider]      Allergies    Ciprofloxacin ; Other; Peanut -containing drug products; Aleve [naproxen sodium]; Ceftriaxone ; Coconut (cocos nucifera); Influenza vaccines; Tetanus toxoid, adsorbed; Tetanus toxoids; and Latex    Review of Systems   Review of Systems  Respiratory:  Positive for shortness of breath.     Physical Exam Updated Vital Signs BP 116/83   Pulse (!) 118   Temp 98.8 F (37.1 C) (Oral)   Resp 18   Ht 3\' 1"  (0.94 m)   Wt 36 kg   SpO2 95%   BMI 40.76 kg/m  Physical Exam Vitals and nursing note reviewed.  Constitutional:      General: She is not in acute distress.    Appearance: She is well-developed.  HENT:     Head: Normocephalic and atraumatic.  Eyes:  Extraocular Movements: Extraocular movements intact.     Conjunctiva/sclera: Conjunctivae normal.     Pupils: Pupils are equal, round, and reactive to light.  Cardiovascular:     Rate and Rhythm: Normal rate and regular rhythm.     Heart sounds: No murmur heard. Pulmonary:     Effort: Pulmonary effort is normal. No respiratory distress.     Breath sounds: Normal breath sounds.  Abdominal:     Palpations: Abdomen is soft.     Tenderness: There is no abdominal tenderness.  Musculoskeletal:        General: No swelling. Normal range of motion.     Cervical back: Neck supple.     Right lower leg: No edema.     Left lower leg: No edema.  Skin:     General: Skin is warm and dry.     Capillary Refill: Capillary refill takes less than 2 seconds.  Neurological:     General: No focal deficit present.     Mental Status: She is alert.  Psychiatric:        Mood and Affect: Mood normal.     ED Results / Procedures / Treatments   Labs (all labs ordered are listed, but only abnormal results are displayed) Labs Reviewed  BASIC METABOLIC PANEL WITH GFR - Abnormal; Notable for the following components:      Result Value   Chloride 97 (*)    BUN 42 (*)    Creatinine, Ser 6.41 (*)    Calcium  7.7 (*)    GFR, Estimated 8 (*)    Anion gap 19 (*)    All other components within normal limits  CBC - Abnormal; Notable for the following components:   Hemoglobin 11.7 (*)    MCV 100.8 (*)    MCHC 29.9 (*)    RDW 16.1 (*)    Platelets 132 (*)    All other components within normal limits  HEPATIC FUNCTION PANEL - Abnormal; Notable for the following components:   Albumin 3.0 (*)    Bilirubin, Direct 0.4 (*)    All other components within normal limits  BRAIN NATRIURETIC PEPTIDE - Abnormal; Notable for the following components:   B Natriuretic Peptide 1,039.6 (*)    All other components within normal limits  TROPONIN I (HIGH SENSITIVITY) - Abnormal; Notable for the following components:   Troponin I (High Sensitivity) 79 (*)    All other components within normal limits  TROPONIN I (HIGH SENSITIVITY)    EKG EKG Interpretation Date/Time:  Wednesday December 30 2023 04:30:31 EDT Ventricular Rate:  118 PR Interval:  146 QRS Duration:  82 QT Interval:  347 QTC Calculation: 487 R Axis:   140  Text Interpretation: Sinus tachycardia Consider right ventricular hypertrophy Borderline prolonged QT interval When compared with ECG of 08/25/2023, HEART RATE has increased Confirmed by Alissa April ([id number]) on 12/30/2023 4:36:56 AM  Radiology DG Chest Port 1 View Result Date: 12/30/2023 EXAM: 1 VIEW XRAY OF THE CHEST 12/30/2023 04:30:00 AM COMPARISON: 1  view chest x-ray 11/26/2023. CLINICAL HISTORY: SOB. Pt BIB [facility] from home. Pt had sudden onset of SOB 2 hrs ago. Originally 88% on RA, 4L [place] applied to pt and O2 raised to 96%. Denies CP; Pt is MWF dialysis, pt reports receiving full treatment on Monday. FINDINGS: LUNGS AND PLEURA: Progressive right-sided airspace disease and pleural opacification is present. The left hemithorax is clear. HEART AND MEDIASTINUM: Cardiac enlargement is stable. BONES AND SOFT TISSUES: No acute osseous abnormality. IMPRESSION: 1.  Progressive right-sided airspace disease and pleural opacification. Most likely represents a loculated pleural effusion Airspace disease is concerning for pneumonia or post-obstructive disease. 2. Stable cardiac enlargement. Electronically signed by: Audree Leas MD 12/30/2023 04:47 AM EDT RP Workstation: [id number]    Procedures Procedures    Medications Ordered in ED Medications - No data to display  ED Course/ Medical Decision Making/ A&P                                 Medical Decision Making This patient presents to the ED for concern of shortness of breath, this involves an extensive number of treatment options, and is a complaint that carries with it a high risk of complications and morbidity.  The differential diagnosis includes pulmonary edema, pneumonia, pleural effusion, pneumothorax, ACS, PE, symptomatic anemia, other   Co morbidities that complicate the patient evaluation :   ESRD, spina bifida, CHF, pulmonary hypertension   Additional history obtained:  Additional history obtained from EMR External records from outside source obtained and reviewed including prior notes and labs   Lab Tests:  I Ordered, and personally interpreted labs.  The pertinent results include: Troponin 79, second pending, CBC and BMP are around patient's baseline   Imaging Studies ordered:  I ordered imaging studies including chest x-ray which shows right pleural effusion I  independently visualized and interpreted imaging within scope of identifying emergent findings  I agree with the radiologist interpretation   Cardiac Monitoring: / EKG:  The patient was maintained on a cardiac monitor.  I personally viewed and interpreted the cardiac monitored which showed an underlying rhythm of: Sinus tachycardia    Problem List / ED Course / Critical interventions / Medication management  As of breath-patient states she started feeling short of breath several hours ago, felt like she may need emergent dialysis.  Apparently was satting 88% with EMS but here is approximately 95% on room air.  She has been mildly tachycardic, she is having chest pain, fevers, cough or other symptoms.  She is lying supine and in no respiratory distress at this time.  She has noted to have decreased breath sounds in the right, correlate with the right pleural effusion, she had small pleural effusion on x-ray last month, had no significant effusion on x-ray in December 2024.  This may be contributing to her symptoms. BNP is lower than previous values she does not have pulmonary edema.  Will continue to watch to make sure she maintains saturations on room air, obtain second troponin to make sure this is flat and we will plan to discharge so she can go to dialysis at noon routinely and follow-up with her nephrologist regarding of pleural effusion.  She may need to be set up for a therapeutic thoracentesis as outpatient.  On reevaluation patient is still saturating between 95 to 97% on room air, however she is still markedly tachycardic between 115 and 120 on the monitor consistently.  Her second troponin is pending.    I consulted Dr. Zelda Hickman, the on-call nephrologist to see if she would be safe for discharge if she is still tachycardic with an for workup.  He states that if she is stable and otherwise going to be discharged she can go to dialysis even if still tachycardic.  Her workup is going to take  longer and she will not get to dialysis he recommended consulting nephrology after 7 AM so they can dialyze her here.  I have reviewed the patients home medicines and have made adjustments as needed   Social Determinants of Health:  Patient lives independently  Signed out to [place] Ula Gambler pending second troponin and PE study    Amount and/or Complexity of Data Reviewed Labs: ordered. Radiology: ordered.  Risk Prescription drug management. Decision regarding hospitalization.           Final Clinical Impression(s) / ED Diagnoses Final diagnoses:  None    Rx / DC Orders ED Discharge Orders     None         Aimee Houseman, PA-C 12/30/23 0650    Aimee Houseman, PA-C 01/04/24 [phone]

## 2023-12-30 NOTE — ED Triage Notes (Signed)
 Pt BIB [facility] from home. Pt had sudden onset of SOB 2 hrs ago. Originally 88% on RA, 4L [place] applied to pt and O2 raised to 96%. Denies CP  Pt is MWF dialysis, pt reports receiving full treatment on Monday.

## 2023-12-30 NOTE — Procedures (Signed)
 I was present at the procedure, reviewed the HD regimen and made appropriate changes.   Larry Poag MD  [facility] 12/30/2023, 4:17 PM '

## 2023-12-30 NOTE — Telephone Encounter (Signed)
 The patient has been notified of the result and verbalized understanding.  All questions (if any) were answered. Gaylene Kays, CMA 12/30/2023 9:52 AM    Upon patient request DME selection is [facility] Patient understands he will be contacted by [facility] to set up his cpap. Patient understands to call if [facility] does not contact him with new setup in a timely manner. Patient understands they will be called once confirmation has been received from [facility] that they have received their new machine to schedule 10 week follow up appointment.   [facility] notified of new cpap order  Please add to airview Patient was grateful for the call and thanked me.

## 2023-12-30 NOTE — ED Provider Notes (Incomplete)
 33 year old female with history of end-stage renal disease on hemodialysis, hypertension comes in because of shortness of breath and concerned that she might need to have emergent dialysis.  She was noted to be mildly hypoxic on arrival but is currently maintaining adequate oxygen  saturation.  On exam, she is mildly tachycardic and cardiac murmur is heard which had been noted previously.  There are markedly deep creased breath sounds on the right and chest x-ray shows large pleural effusion which is essentially unchanged from 11/26/2023.  May need to consider therapeutic thoracentesis on an elective basis.

## 2023-12-30 NOTE — Consult Note (Signed)
 Renal Service Consult Note [facility]  [name] 12/30/2023 Lynae Sandifer, MD Requesting Physician: Dr. Ranelle Buys  Reason for Consult: ESRD the patient with shortness of breath HPI: The patient is a 33 y.o. year-old w/ PMH as below who presented to ED with complaints of shortness of breath for 1 to 2 days.  EMS had O2 sats at 88% on room air.  In the ED she was 95% on room air.  In the ED blood pressure 116/83, heart rate 118, respiratory rate 18-32.  Afebrile.  We were asked to see the patient for need for dialysis.   Pt seen in hospital room.  Main complaint is shortness of breath.  Also has swelling in her lower extremities bilateral.  Has not missed dialysis   ROS - denies CP, no joint pain, no HA, no blurry vision, no rash, no diarrhea, no nausea/ vomiting  PMH: Asthma Anemia Caudal regression syndrome Depression ESRD on hemodialysis HTN OSA Spina bifida UTI  Past Surgical History  Past Surgical History:  Procedure Laterality Date   A/V FISTULAGRAM Left 10/14/2021   Procedure: A/V Fistulagram;  Surgeon: Adine Hoof, MD;  Location: [facility] INVASIVE CV LAB;  Service: Cardiovascular;  Laterality: Left;   AV FISTULA PLACEMENT     left arm   INSERTION OF DIALYSIS CATHETER N/A 05/08/2020   Procedure: ATTEMPTED, UNSUCCESSFUL INSERTION OF DIALYSIS CATHETER TUNNELED RIGHT INTERNAL JUGULAR;  Surgeon: Awilda Bogus, MD;  Location: [facility] ORS;  Service: General;  Laterality: N/A;   IR FLUORO GUIDE CV LINE RIGHT  05/09/2020   IR US  GUIDE VASC ACCESS RIGHT  05/09/2020   RIGHT HEART CATH N/A 06/30/2023   Procedure: RIGHT HEART CATH;  Surgeon: Darlis Eisenmenger, MD;  Location: [facility] INVASIVE CV LAB;  Service: Cardiovascular;  Laterality: N/A;   Family History  Family History  Problem Relation Age of Onset   Kidney cancer Other    Hypertension Maternal Grandmother    Arthritis Maternal Grandmother    Breast cancer Maternal Aunt    Colon cancer Neg Hx     Social History  reports that she has been smoking cigarettes. She has a 2.8 pack-year smoking history. She has been exposed to tobacco smoke. She has never used smokeless tobacco. She reports current drug use. Drug: Marijuana. She reports that she does not drink alcohol. Allergies  Allergies  Allergen Reactions   Ciprofloxacin  Shortness Of Breath, Nausea And Vomiting and Other (See Comments)    HIGH FEVER and oral blisters    Other Anaphylaxis    Revaclear dialzer   Peanut -Containing Drug Products Anaphylaxis   Aleve [Naproxen Sodium] Other (See Comments)    G.I.Bleed   Ceftriaxone  Other (See Comments)    Blisters in mouth    Coconut (Cocos Nucifera) Hives   Influenza Vaccines Nausea And Vomiting and Other (See Comments)    High fever   Tetanus Toxoid, Adsorbed Nausea And Vomiting and Other (See Comments)    HIGH FEVER, also   Tetanus Toxoids Nausea And Vomiting and Other (See Comments)    HIGH FEVER   Latex Itching and Rash   Home medications Prior to Admission medications   Medication Sig Start Date End Date Taking? Authorizing Provider  albuterol  (PROVENTIL ) (2.5 MG/3ML) 0.083% nebulizer solution Take 3 mLs (2.5 mg total) by nebulization every 6 (six) hours as needed for wheezing or shortness of breath. 07/28/23  Yes Austine Lefort, MD  albuterol  (VENTOLIN  HFA) 108 (90 Base) MCG/ACT inhaler Inhale 1-2 puffs into  the lungs every 6 (six) hours as needed for wheezing or shortness of breath. Patient taking differently: Inhale 2 puffs into the lungs every 6 (six) hours as needed for wheezing or shortness of breath. 09/15/22  Yes Jenelle Mis, FNP  AURYXIA  1 GM 210 MG(Fe) tablet Take 420 mg by mouth 2 (two) times daily with a meal. 04/15/18  Yes [provider]  B Complex-C-Folic Acid  (DIALYVITE  800) 0.8 MG TABS Take 1 tablet by mouth daily. 12/07/23  Yes [provider]  EPINEPHrine  0.3 mg/0.3 mL IJ SOAJ injection Inject 0.3 mg into the muscle as needed for  anaphylaxis. 04/23/23  Yes Jenelle Mis, FNP  metoprolol  succinate (TOPROL -XL) 100 MG 24 hr tablet Take 100 mg by mouth daily. 12/29/19  Yes [provider]  omeprazole (PRILOSEC) 40 MG capsule Take 40 mg by mouth in the morning and at bedtime. 01/08/22  Yes [provider]  ondansetron  (ZOFRAN ) 4 MG tablet Take 4 mg by mouth every 8 (eight) hours as needed for nausea or vomiting. 12/09/23  Yes [provider]  sodium zirconium cyclosilicate  (LOKELMA ) 5 g packet Take 10 g by mouth every Monday, Wednesday, and Friday. 07/20/21  Yes [provider]  meclizine  (ANTIVERT ) 12.5 MG tablet Take 1 tablet (12.5 mg total) by mouth 2 (two) times daily as needed for dizziness. Patient not taking: Reported on 12/30/2023 11/26/23   Alissa April, MD     Vitals:   12/30/23 9147 12/30/23 0428 12/30/23 0737 12/30/23 0852  BP:   121/80   Pulse: (!) 120 (!) 118 (!) 121   Resp: (!) 23 18 17 15   Temp:   98.5 F (36.9 C)   TempSrc:      SpO2: 97% 95% 97%   Weight:      Height:       Exam Gen alert, no distress, on RA No rash, cyanosis or gangrene Sclera anicteric, throat clear  No jvd or bruits Chest clear bilat to bases, no rales/ wheezing RRR no MRG Abd soft ntnd no mass or ascites +bs GU defer MS no joint effusions or deformity Ext bilat LE stumps w/ 1+ edema Neuro is alert, Ox 3 , nf    AVF LUA +bruit       Renal-related home meds: Auryxia  2 AC 3 times daily Toprol -XL 100 mg daily Lokelma  10 g every MWF Others: Antivert , Zofran , PPI, albuterol     OP HD: [facility] MWF 3.5h   B300    33kg     AVF   Heparin  none  Last OP HD 6/02, post wt 33.8kg   Good compliance, comes off 0-1 kg over Vdra 2.5 mcg mwf Sensipar  60mg  mwf   Assessment/ Plan: SOB: esrd pt w/ worsening large R pleural effusion, and CT showing GG changes which is usually c/w early pulm edema. HR ^ w/ movement. Is on RA.  Recommend HD here today.  Decision re: admission will leave to ED. Plan max UF  w/ HD.  ESRD: on HD MWF. No missed HD. HD as above.  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 Follow.  Secondary hyperparathyroidism: CCa in range, cont binders ac    Larry Poag  MD [facility] 12/30/2023, 11:10 AM  Recent Labs  Lab 12/30/23 0414  HGB 11.7*  ALBUMIN 3.0*  CALCIUM  7.7*  CREATININE 6.41*  K 4.3   Inpatient medications:  Chlorhexidine  Gluconate Cloth  6 each Topical Q0600

## 2023-12-30 NOTE — ED Notes (Signed)
 Patient transported to CT

## 2023-12-30 NOTE — Plan of Care (Signed)

## 2023-12-30 NOTE — H&P (Signed)
 History and Physical    Patient: [name] HYQ:[id number] DOB: 23-Nov-1990 DOA: 12/30/2023 DOS: the patient was seen and examined on 12/30/2023 PCP: [facility]  Patient coming from: Home  Chief Complaint:  Chief Complaint  Patient presents with   Shortness of Breath   HPI: [name] is a 33 y.o. female with medical history significant of spina bifida, HTN, ESRD (HD on MWF), and depression p/w SOB iso R pleural effusion.  Pt was in her USOH until this morning at 0300 when she experienced acute onset SOB while sitting up watching television for which pt activated EMS. Pt reports having her last HD session on Monday. Denies any sick contacts or recent medications changes.  In the ED, pt tachycardic and tachypneic on RA. Labs notable for BUN/Cr 42/6.41, BNP 1039, troponin 79-->78, and Hb 11.7.  CTA neg for PE, but did show large R pleural effusion with RLL and RML collapse. Pt admitted to medicine w/ Nephrology following.  Review of Systems: As mentioned in the history of present illness. All other systems reviewed and are negative. 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   Depression 05/14/2015   Dialysis care    ESRD (end stage renal disease) on dialysis [facility])    GERD (gastroesophageal reflux disease) 01/07/2017   HTN (hypertension) 05/02/2011   Murmur, cardiac 07/17/2022   OSA (obstructive sleep apnea) 10/26/2023   Pneumonia, unspecified organism 07/27/2023   Spina bifida    UTI (lower urinary tract infection)    Viral URI with cough 09/15/2022   Past Surgical History:  Procedure Laterality Date   A/V FISTULAGRAM Left 10/14/2021   Procedure: A/V Fistulagram;  Surgeon: Adine Hoof, MD;  Location: [facility] INVASIVE CV LAB;  Service: Cardiovascular;  Laterality: Left;   AV FISTULA PLACEMENT      left arm   INSERTION OF DIALYSIS CATHETER N/A 05/08/2020   Procedure: ATTEMPTED, UNSUCCESSFUL INSERTION OF DIALYSIS CATHETER TUNNELED RIGHT INTERNAL JUGULAR;  Surgeon: Awilda Bogus, MD;  Location: [facility];  Service: General;  Laterality: N/A;   IR FLUORO GUIDE CV LINE RIGHT  05/09/2020   IR US  GUIDE VASC ACCESS RIGHT  05/09/2020   RIGHT HEART CATH N/A 06/30/2023   Procedure: RIGHT HEART CATH;  Surgeon: Darlis Eisenmenger, MD;  Location: [facility] INVASIVE CV LAB;  Service: Cardiovascular;  Laterality: N/A;   Social History:  reports that she has been smoking cigarettes. She has a 2.8 pack-year smoking history. She has been exposed to tobacco smoke. She has never used smokeless tobacco. She reports current drug use. Drug: Marijuana. She reports that she does not drink alcohol.  Allergies  Allergen Reactions   Ciprofloxacin  Shortness Of Breath, Nausea And Vomiting and Other (See Comments)    HIGH FEVER and oral blisters    Other Anaphylaxis    Revaclear dialzer   Peanut -Containing Drug Products Anaphylaxis   Aleve [Naproxen Sodium] Other (See Comments)    G.I.Bleed   Ceftriaxone  Other (See Comments)    Blisters in mouth    Coconut (Cocos Nucifera) Hives   Influenza Vaccines Nausea And Vomiting and Other (See Comments)    High fever   Tetanus Toxoid, Adsorbed Nausea And Vomiting and Other (See Comments)    HIGH FEVER, also   Tetanus Toxoids Nausea And Vomiting and Other (See Comments)  HIGH FEVER   Latex Itching and Rash    Family History  Problem Relation Age of Onset   Kidney cancer Other    Hypertension Maternal Grandmother    Arthritis Maternal Grandmother    Breast cancer Maternal Aunt    Colon cancer Neg Hx     Prior to Admission medications   Medication Sig Start Date End Date Taking? Authorizing Provider  albuterol  (PROVENTIL ) (2.5 MG/3ML) 0.083% nebulizer solution Take 3 mLs (2.5 mg total) by nebulization every 6 (six) hours as needed for wheezing or shortness of  breath. 07/28/23  Yes Austine Lefort, MD  albuterol  (VENTOLIN  HFA) 108 (90 Base) MCG/ACT inhaler Inhale 1-2 puffs into the lungs every 6 (six) hours as needed for wheezing or shortness of breath. Patient taking differently: Inhale 2 puffs into the lungs every 6 (six) hours as needed for wheezing or shortness of breath. 09/15/22  Yes Jenelle Mis, FNP  AURYXIA  1 GM 210 MG(Fe) tablet Take 420 mg by mouth 2 (two) times daily with a meal. 04/15/18  Yes [provider]  B Complex-C-Folic Acid  (DIALYVITE  800) 0.8 MG TABS Take 1 tablet by mouth daily. 12/07/23  Yes [provider]  EPINEPHrine  0.3 mg/0.3 mL IJ SOAJ injection Inject 0.3 mg into the muscle as needed for anaphylaxis. 04/23/23  Yes Jenelle Mis, FNP  metoprolol  succinate (TOPROL -XL) 100 MG 24 hr tablet Take 100 mg by mouth daily. 12/29/19  Yes [provider]  omeprazole (PRILOSEC) 40 MG capsule Take 40 mg by mouth in the morning and at bedtime. 01/08/22  Yes [provider]  ondansetron  (ZOFRAN ) 4 MG tablet Take 4 mg by mouth every 8 (eight) hours as needed for nausea or vomiting. 12/09/23  Yes [provider]  sodium zirconium cyclosilicate  (LOKELMA ) 5 g packet Take 10 g by mouth every Monday, Wednesday, and Friday. 07/20/21  Yes [provider]  meclizine  (ANTIVERT ) 12.5 MG tablet Take 1 tablet (12.5 mg total) by mouth 2 (two) times daily as needed for dizziness. Patient not taking: Reported on 12/30/2023 11/26/23   Alissa April, MD    Physical Exam: Vitals:   12/30/23 0852 12/30/23 1008 12/30/23 1400 12/30/23 1402  BP:  120/78 (!) 125/93   Pulse:  93 (!) 102   Resp: 15 (!) 23 18   Temp:    98.2 F (36.8 C)  TempSrc:    Oral  SpO2:  96% 100%   Weight:      Height:       General: Alert, oriented x3, resting comfortably in no acute distress Respiratory: R basilar rale w/o wheezing Cardiovascular: Regular rate and rhythm w/o m/r/g   Data Reviewed: {Tip this will not be part of  the note when signed- Document your independent interpretation of telemetry tracing, EKG, lab, Radiology test or any other diagnostic tests. Add any new diagnostic test ordered today. (Optional):[id number]} Lab Results  Component Value Date   WBC 7.1 12/30/2023   HGB 11.7 (L) 12/30/2023   HCT 39.1 12/30/2023   MCV 100.8 (H) 12/30/2023   PLT 132 (L) 12/30/2023   Lab Results  Component Value Date   GLUCOSE 88 12/30/2023   CALCIUM  7.7 (L) 12/30/2023   NA 140 12/30/2023   K 4.3 12/30/2023   CO2 24 12/30/2023   CL 97 (L) 12/30/2023   BUN 42 (H) 12/30/2023   CREATININE 6.41 (H) 12/30/2023   Lab Results  Component Value Date   ALT 16 12/30/2023   AST 26 12/30/2023  ALKPHOS 114 12/30/2023   BILITOT 1.2 12/30/2023   Lab Results  Component Value Date   INR 0.93 01/22/2011    Radiology: CT Angio Chest PE W and/or Wo Contrast Result Date: 12/30/2023 EXAM: CTA of the Chest with contrast for PE 12/30/2023 07:24:38 AM TECHNIQUE: CTA of the chest was performed after the administration of intravenous contrast. Multiplanar reformatted images are provided for review. MIP images are provided for review. Automated exposure control, iterative reconstruction, and/or weight based adjustment of the mA/kV was utilized to reduce the radiation dose to as low as reasonably achievable. COMPARISON: CT angio chest 03/31/2020 CLINICAL HISTORY: Pulmonary embolism (PE) suspected, high prob. female with history of spina bifida, CHF, pulmonary hypertension, mitral stenosis, bilateral AKAs, ESRD on hemodialysis Monday Wednesday Friday. She presents to the ER today for evaluation for shortness of breath that started several hours ago. She states she feels like she has too much fluid and needs dialysis early. FINDINGS: PULMONARY ARTERIES: Pulmonary arteries are adequately opacified for evaluation. No pulmonary embolism. Main pulmonary artery is normal in caliber. MEDIASTINUM: Heart is mildly enlarged. There is no acute  abnormality of the thoracic aorta. LYMPH NODES: No mediastinal, hilar or axillary lymphadenopathy. LUNGS AND PLEURA: A large right pleural effusion is present. The right lower lobe is mostly collapsed. Partial collapse of the right middle lobe is present. Diffuse ground-glass attenuation is present within the aerated lungs. No mass lesion is present. UPPER ABDOMEN: Abdominal ascites is again noted. Heterogeneous appearance of the pancreas is stable. SOFT TISSUES AND BONES: Caudal regression again noted. No acute bone or soft tissue abnormality. IMPRESSION: 1. No pulmonary embolism. 2. Large right pleural effusion with associated right lower lobe collapse and partial right middle lobe collapse. 3. Diffuse ground-glass attenuation within the aerated lungs, no mass lesion. This likely reflects edema. Electronically signed by: Audree Leas MD 12/30/2023 07:33 AM EDT RP Workstation: [id number]   DG Chest Port 1 View Result Date: 12/30/2023 EXAM: 1 VIEW XRAY OF THE CHEST 12/30/2023 04:30:00 AM COMPARISON: 1 view chest x-ray 11/26/2023. CLINICAL HISTORY: SOB. Pt BIB [facility] from home. Pt had sudden onset of SOB 2 hrs ago. Originally 88% on RA, 4L [place] applied to pt and O2 raised to 96%. Denies CP; Pt is MWF dialysis, pt reports receiving full treatment on Monday. FINDINGS: LUNGS AND PLEURA: Progressive right-sided airspace disease and pleural opacification is present. The left hemithorax is clear. HEART AND MEDIASTINUM: Cardiac enlargement is stable. BONES AND SOFT TISSUES: No acute osseous abnormality. IMPRESSION: 1. Progressive right-sided airspace disease and pleural opacification. Most likely represents a loculated pleural effusion Airspace disease is concerning for pneumonia or post-obstructive disease. 2. Stable cardiac enlargement. Electronically signed by: Audree Leas MD 12/30/2023 04:47 AM EDT RP Workstation: [id number]    Assessment and Plan: 33F h/o spina bifida, HTN, ESRD (HD on MWF), and  depression p/w SOB iso R pleural effusion.  SOB R pleural effusion H/o ESRD High suspicion for volume overload 2/2 ESRD; would be prudent to exclude loculated effusion with repeat imaging after euvolemia s/p HD -Nephrology following; apprec eval/recs -Consider repeat imaging following HD to ensure pt does not have loculated pleural effusion -PTA Auryxia  420mg  BID, and Lokelma  10mg  prn -F/u procalcitonin  HTN -PTA metoprolol  XL 100mg  daily   Advance Care Planning:   Code Status: Full Code   Consults: Nephrology  Family Communication: N/A  Severity of Illness: The appropriate patient status for this patient is INPATIENT. Inpatient status is judged to be reasonable and necessary in order to  provide the required intensity of service to ensure the patient's safety. The patient's presenting symptoms, physical exam findings, and initial radiographic and laboratory data in the context of their chronic comorbidities is felt to place them at high risk for further clinical deterioration. Furthermore, it is not anticipated that the patient will be medically stable for discharge from the hospital within 2 midnights of admission.   * I certify that at the point of admission it is my clinical judgment that the patient will require inpatient hospital care spanning beyond 2 midnights from the point of admission due to high intensity of service, high risk for further deterioration and high frequency of surveillance required.*  ------- I spent 55 minutes reviewing previous labs/notes, obtaining separate history at the bedside, counseling/discussing the treatment plan outlined above, ordering medications/tests, and performing clinical documentation.   Author: Arne Langdon, MD 12/30/2023 2:17 PM  For on call review [email or web address].

## 2023-12-30 NOTE — Progress Notes (Signed)
 [facility] ED [facility] Liaison Note                  This patient is enrolled in the Home Based Primary Care program of [facility].   Hospital Liaison Team will follow for disposition.   Please reach out if there are questions or concerns.   Dwane Gitelman, BSN, RN [employer]  [phone]

## 2023-12-30 NOTE — ED Notes (Signed)
 Got patient cleaned up changed patient sheets got patient some warm blankets patient is resting with call bell in reach

## 2023-12-31 ENCOUNTER — Encounter (HOSPITAL_COMMUNITY): Payer: Self-pay | Admitting: Hospitalist

## 2023-12-31 DIAGNOSIS — J90 Pleural effusion, not elsewhere classified: Secondary | ICD-10-CM | POA: Diagnosis not present

## 2023-12-31 DIAGNOSIS — N186 End stage renal disease: Secondary | ICD-10-CM | POA: Diagnosis not present

## 2023-12-31 DIAGNOSIS — Z89612 Acquired absence of left leg above knee: Secondary | ICD-10-CM | POA: Diagnosis not present

## 2023-12-31 DIAGNOSIS — N2581 Secondary hyperparathyroidism of renal origin: Secondary | ICD-10-CM | POA: Diagnosis not present

## 2023-12-31 DIAGNOSIS — Q057 Lumbar spina bifida without hydrocephalus: Secondary | ICD-10-CM | POA: Diagnosis not present

## 2023-12-31 DIAGNOSIS — Z992 Dependence on renal dialysis: Secondary | ICD-10-CM | POA: Diagnosis not present

## 2023-12-31 DIAGNOSIS — R0602 Shortness of breath: Secondary | ICD-10-CM | POA: Diagnosis not present

## 2023-12-31 DIAGNOSIS — Z89611 Acquired absence of right leg above knee: Secondary | ICD-10-CM | POA: Diagnosis not present

## 2023-12-31 DIAGNOSIS — I10 Essential (primary) hypertension: Secondary | ICD-10-CM | POA: Diagnosis not present

## 2023-12-31 DIAGNOSIS — D631 Anemia in chronic kidney disease: Secondary | ICD-10-CM | POA: Diagnosis not present

## 2023-12-31 DIAGNOSIS — Z993 Dependence on wheelchair: Secondary | ICD-10-CM | POA: Diagnosis not present

## 2023-12-31 DIAGNOSIS — J810 Acute pulmonary edema: Secondary | ICD-10-CM | POA: Diagnosis not present

## 2023-12-31 DIAGNOSIS — I120 Hypertensive chronic kidney disease with stage 5 chronic kidney disease or end stage renal disease: Secondary | ICD-10-CM | POA: Diagnosis not present

## 2023-12-31 LAB — BASIC METABOLIC PANEL WITH GFR
Anion gap: 13 (ref 5–15)
BUN: 27 mg/dL — ABNORMAL HIGH (ref 6–20)
CO2: 26 mmol/L (ref 22–32)
Calcium: 8.5 mg/dL — ABNORMAL LOW (ref 8.9–10.3)
Chloride: 99 mmol/L (ref 98–111)
Creatinine, Ser: 4.6 mg/dL — ABNORMAL HIGH (ref 0.44–1.00)
GFR, Estimated: 12 mL/min — ABNORMAL LOW (ref >60)
Glucose, Bld: 82 mg/dL (ref 70–99)
Potassium: 4 mmol/L (ref 3.5–5.1)
Sodium: 138 mmol/L (ref 135–145)

## 2023-12-31 LAB — CBC
HCT: 38.4 % (ref 36.0–46.0)
Hemoglobin: 11.6 g/dL — ABNORMAL LOW (ref 12.0–15.0)
MCH: 30.6 pg (ref 26.0–34.0)
MCHC: 30.2 g/dL (ref 30.0–36.0)
MCV: 101.3 fL — ABNORMAL HIGH (ref 80.0–100.0)
Platelets: 123 10*3/uL — ABNORMAL LOW (ref 150–400)
RBC: 3.79 MIL/uL — ABNORMAL LOW (ref 3.87–5.11)
RDW: 16.1 % — ABNORMAL HIGH (ref 11.5–15.5)
WBC: 5.8 10*3/uL (ref 4.0–10.5)
nRBC: 0 % (ref 0.0–0.2)

## 2023-12-31 LAB — MRSA NEXT GEN BY PCR, NASAL: MRSA by PCR Next Gen: NOT DETECTED

## 2023-12-31 MED ORDER — CHLORHEXIDINE GLUCONATE CLOTH 2 % EX PADS
6.0000 | MEDICATED_PAD | Freq: Every day | CUTANEOUS | Status: DC
Start: 2024-01-01 — End: 2024-01-05
  Administered 2024-01-01 – 2024-01-04 (×4): 6 via TOPICAL

## 2023-12-31 NOTE — Consult Note (Signed)
 NAME:  [name], MRN:  [id number], DOB:  November 15, 1990, LOS: 1 ADMISSION DATE:  12/30/2023, CONSULTATION DATE: 12/31/2023 REFERRING MD: [facility], CHIEF COMPLAINT: Shortness of breath  History of Present Illness:  33 year old female who has a extremely complicated past medical history that entails but is not limited to spina bifida, obstructive sleep apnea, wheelchair dependent, end-stage renal disease, who presents with chief complaints of shortness of breath.  She has a right loculated pleural effusion that would be more amenable to interventional radiology drainage down from pulmonary.  May give consideration to continuing to obtain the drier weight she has no fevers chills sweats or other indication of infection.  Pertinent  Medical History   Past Medical History:  Diagnosis Date   Acute asthma exacerbation 07/15/2023   Anemia associated with chronic renal failure    Blood transfusion    Caudal regression syndrome    Assoc with spina bifida.   Depression 05/14/2015   Dialysis care    ESRD (end stage renal disease) on dialysis [facility])    GERD (gastroesophageal reflux disease) 01/07/2017   HTN (hypertension) 05/02/2011   Murmur, cardiac 07/17/2022   OSA (obstructive sleep apnea) 10/26/2023   Pneumonia, unspecified organism 07/27/2023   Spina bifida    UTI (lower urinary tract infection)    Viral URI with cough 09/15/2022     Significant Hospital Events: Including procedures, antibiotic start and stop dates in addition to other pertinent events     Interim History / Subjective:  Presented with shortness of breath  Objective    Blood pressure 106/71, pulse 86, temperature 98.4 F (36.9 C), resp. rate 19, height 3\' 1"  (0.94 m), weight 36 kg, SpO2 96%.        Intake/Output Summary (Last 24 hours) at 12/31/2023 1024 Last data filed at 12/30/2023 1858 Gross per 24 hour  Intake --  Output 2300 ml  Net -2300 ml   Filed Weights   12/30/23 0408  Weight: 36 kg     Examination: General: 33 yo female no acute distress HENT: No JVD or lymphadenopathy is appreciated Lungs: Diminished breath sounds on the right side Cardiovascular: Heart sounds are regular with a normal Abdomen: Soft nontender Extremities: Left bicep AV fistula with positive bruit throughout, lower extremity contractures Neuro: History of spina bifida wheelchair-bound GU: End-stage renal disease  Resolved problem list   Assessment and Plan  Right loculated effusion in the setting of end-stage renal disease without complaint of fever, chills, sweats or purulent sputum.  This is an anterior component to her left leg most likely require an anterior approach for thoracentesis. Consider extra hemodialysis for determination of shrinkage effusion. Possible interventional radiology thoracentesis.  End-stage renal disease with a fistula left bicep For the renal  Spina bifida Wheelchair-bound    Best Practice (right click and "Reselect all SmartList Selections" daily)   Diet/type: NPO DVT prophylaxis prophylactic heparin   Pressure ulcer(s): N/A GI prophylaxis: PPI Lines: N/A Foley:  N/A Code Status:  full code Last date of multidisciplinary goals of care discussion [tbd]  Labs   CBC: Recent Labs  Lab 12/30/23 0414 12/30/23 2008 12/31/23 0628  WBC 7.1 5.4 5.8  HGB 11.7* 11.3* 11.6*  HCT 39.1 36.9 38.4  MCV 100.8* 98.7 101.3*  PLT 132* 125* 123*    Basic Metabolic Panel: Recent Labs  Lab 12/30/23 0414 12/30/23 2008 12/31/23 0628  NA 140  --  138  K 4.3  --  4.0  CL 97*  --  99  CO2 24  --  26  GLUCOSE 88  --  82  BUN 42*  --  27*  CREATININE 6.41* 3.13* 4.60*  CALCIUM  7.7*  --  8.5*   GFR: Estimated Creatinine Clearance: 2.8 mL/min (A) (by C-G formula based on SCr of 4.6 mg/dL (H)). Recent Labs  Lab 12/30/23 0414 12/30/23 2008 12/31/23 0628  PROCALCITON  --  0.96  --   WBC 7.1 5.4 5.8    Liver Function Tests: Recent Labs  Lab 12/30/23 0414   AST 26  ALT 16  ALKPHOS 114  BILITOT 1.2  PROT 6.8  ALBUMIN 3.0*   No results for input(s): "LIPASE", "AMYLASE" in the last 168 hours. No results for input(s): "AMMONIA" in the last 168 hours.  ABG    Component Value Date/Time   HCO3 27.7 06/30/2023 1142   HCO3 28.1 (H) 06/30/2023 1142   TCO2 29 06/30/2023 1142   TCO2 29 06/30/2023 1142   ACIDBASEDEF 16.0 (H) 08/22/2007 0346   O2SAT 68 06/30/2023 1142   O2SAT 70 06/30/2023 1142     Coagulation Profile: No results for input(s): "INR", "PROTIME" in the last 168 hours.  Cardiac Enzymes: No results for input(s): "CKTOTAL", "CKMB", "CKMBINDEX", "TROPONINI" in the last 168 hours.  HbA1C: Hgb A1c MFr Bld  Date/Time Value Ref Range Status  04/30/2022 11:02 AM 5.0 <5.7 % of total Hgb Final    Comment:    For the purpose of screening for the presence of diabetes: . <5.7%       Consistent with the absence of diabetes 5.7-6.4%    Consistent with increased risk for diabetes             (prediabetes) > or =6.5%  Consistent with diabetes . This assay result is consistent with a decreased risk of diabetes. . Currently, no consensus exists regarding use of hemoglobin A1c for diagnosis of diabetes in children. . According to American Diabetes Association (ADA) guidelines, hemoglobin A1c <7.0% represents optimal control in non-pregnant diabetic patients. Different metrics may apply to specific patient populations.  Standards of Medical Care in Diabetes(ADA). .     CBG: Recent Labs  Lab 12/30/23 1751  GLUCAP 75    Review of Systems:   10 point review of system taken, please see HPI for positives and negatives.   Past Medical History:  She,  has a past medical history of Acute asthma exacerbation (07/15/2023), Anemia associated with chronic renal failure, Blood transfusion, Caudal regression syndrome, Depression (05/14/2015), Dialysis care, ESRD (end stage renal disease) on dialysis (HCC), GERD (gastroesophageal  reflux disease) (01/07/2017), HTN (hypertension) (05/02/2011), Murmur, cardiac (07/17/2022), OSA (obstructive sleep apnea) (10/26/2023), Pneumonia, unspecified organism (07/27/2023), Spina bifida, UTI (lower urinary tract infection), and Viral URI with cough (09/15/2022).   Surgical History:   Past Surgical History:  Procedure Laterality Date   A/V FISTULAGRAM Left 10/14/2021   Procedure: A/V Fistulagram;  Surgeon: Adine Hoof, MD;  Location: [facility] INVASIVE CV LAB;  Service: Cardiovascular;  Laterality: Left;   AV FISTULA PLACEMENT     left arm   INSERTION OF DIALYSIS CATHETER N/A 05/08/2020   Procedure: ATTEMPTED, UNSUCCESSFUL INSERTION OF DIALYSIS CATHETER TUNNELED RIGHT INTERNAL JUGULAR;  Surgeon: Awilda Bogus, MD;  Location: [facility];  Service: General;  Laterality: N/A;   IR FLUORO GUIDE CV LINE RIGHT  05/09/2020   IR US  GUIDE VASC ACCESS RIGHT  05/09/2020   RIGHT HEART CATH N/A 06/30/2023   Procedure: RIGHT HEART CATH;  Surgeon: Darlis Eisenmenger, MD;  Location: [facility] INVASIVE CV LAB;  Service: Cardiovascular;  Laterality: N/A;     Social History:   reports that she has been smoking cigarettes. She has a 2.8 pack-year smoking history. She has been exposed to tobacco smoke. She has never used smokeless tobacco. She reports current drug use. Drug: Marijuana. She reports that she does not drink alcohol.   Family History:  Her family history includes Arthritis in her maternal grandmother; Breast cancer in her maternal aunt; Hypertension in her maternal grandmother; Kidney cancer in an other family member. There is no history of Colon cancer.   Allergies Allergies  Allergen Reactions   Ciprofloxacin  Shortness Of Breath, Nausea And Vomiting and Other (See Comments)    HIGH FEVER and oral blisters    Other Anaphylaxis    Revaclear dialzer   Peanut -Containing Drug Products Anaphylaxis   Aleve [Naproxen Sodium] Other (See Comments)    G.I.Bleed   Ceftriaxone  Other (See Comments)     Blisters in mouth    Coconut (Cocos Nucifera) Hives   Influenza Vaccines Nausea And Vomiting and Other (See Comments)    High fever   Tetanus Toxoid, Adsorbed Nausea And Vomiting and Other (See Comments)    HIGH FEVER, also   Tetanus Toxoids Nausea And Vomiting and Other (See Comments)    HIGH FEVER   Latex Itching and Rash     Home Medications  Prior to Admission medications   Medication Sig Start Date End Date Taking? Authorizing Provider  albuterol  (PROVENTIL ) (2.5 MG/3ML) 0.083% nebulizer solution Take 3 mLs (2.5 mg total) by nebulization every 6 (six) hours as needed for wheezing or shortness of breath. 07/28/23  Yes Austine Lefort, MD  albuterol  (VENTOLIN  HFA) [phone] Base) MCG/ACT inhaler Inhale 1-2 puffs into the lungs every 6 (six) hours as needed for wheezing or shortness of breath. Patient taking differently: Inhale 2 puffs into the lungs every 6 (six) hours as needed for wheezing or shortness of breath. 09/15/22  Yes Jenelle Mis, FNP  AURYXIA  1 GM 210 MG(Fe) tablet Take 420 mg by mouth 2 (two) times daily with a meal. 04/15/18  Yes [provider]  B Complex-C-Folic Acid  (DIALYVITE  800) 0.8 MG TABS Take 1 tablet by mouth daily. 12/07/23  Yes [provider]  EPINEPHrine  0.3 mg/0.3 mL IJ SOAJ injection Inject 0.3 mg into the muscle as needed for anaphylaxis. 04/23/23  Yes Jenelle Mis, FNP  metoprolol  succinate (TOPROL -XL) 100 MG 24 hr tablet Take 100 mg by mouth daily. 12/29/19  Yes [provider]  omeprazole (PRILOSEC) 40 MG capsule Take 40 mg by mouth in the morning and at bedtime. 01/08/22  Yes [provider]  ondansetron  (ZOFRAN ) 4 MG tablet Take 4 mg by mouth every 8 (eight) hours as needed for nausea or vomiting. 12/09/23  Yes [provider]  sodium zirconium cyclosilicate  (LOKELMA ) 5 g packet Take 10 g by mouth every Monday, Wednesday, and Friday. 07/20/21  Yes [provider]  meclizine  (ANTIVERT ) 12.5 MG  tablet Take 1 tablet (12.5 mg total) by mouth 2 (two) times daily as needed for dizziness. Patient not taking: Reported on 12/30/2023 11/26/23   Alissa April, MD     Critical care time: Bonnee Bustle Marlisha Vanwyk ACNP Acute Care Nurse Practitioner [facility] Pulmonary/Critical Care Please consult Amion 12/31/2023, 10:24 AM

## 2023-12-31 NOTE — Assessment & Plan Note (Addendum)
 12-31-2023 stable. Wheelchair dependent.  01-01-2024 chronic.  01-02-2024 stable  01-03-2024 stable  01-04-2024 stable.

## 2023-12-31 NOTE — Assessment & Plan Note (Addendum)
 12-31-2023 on Toprol -XL 100 mg daily.  01-01-2024 stable.  01-02-2024 stable  01-03-2024 bp was low yesterday. Given IVF and po midodrine.  01-04-2024 stable. Stop midodrine. DC To home.

## 2023-12-31 NOTE — Progress Notes (Signed)
 Pt refused SCD , dr Amalia Jung informed .

## 2023-12-31 NOTE — Care Management CC44 (Cosign Needed)
 Condition Code 44 Documentation Completed  Patient Details  Name: [name] MRN: [id number] Date of Birth: September 08, 1990   Condition Code 44 given:  Yes Patient signature on Condition Code 44 notice:  Yes Documentation of 2 MD's agreement:  Yes Code 44 added to claim:  Yes    Dane Dung, RN 12/31/2023, 12:05 PM

## 2023-12-31 NOTE — Plan of Care (Signed)

## 2023-12-31 NOTE — Progress Notes (Signed)
 Pt refused Ferric citrate  because she said it makes her constipated.

## 2023-12-31 NOTE — Hospital Course (Addendum)
 HPI:  [name] is a 33 y.o. female with medical history significant of spina bifida, HTN, ESRD (HD on MWF), and depression p/w SOB iso R pleural effusion.   Pt was in her USOH until this morning at 0300 when she experienced acute onset SOB while sitting up watching television for which pt activated EMS. Pt reports having her last HD session on Monday. Denies any sick contacts or recent medications changes.   In the ED, pt tachycardic and tachypneic on RA. Labs notable for BUN/Cr 42/6.41, BNP 1039, troponin 79-->78, and Hb 11.7.  CTA neg for PE, but did show large R pleural effusion with RLL and RML collapse. Pt admitted to medicine w/ Nephrology following.    Significant Events: Admitted 12/30/2023 for recurrent right pleural effusion   Admission Labs: WBC 7.1, HgB 11.7, plt 132 Na 140, K 4.3, CO2 of 24, BUN 42, scr 6.41, glu 88 TP 6.8, alb 3.0, AST 26, ALT 16, T. Bili 1.2 BNP 1039  Admission Imaging Studies: CXR Progressive right-sided airspace disease and pleural opacification. Most likely represents a loculated pleural effusion Airspace disease is concerning for pneumonia or post-obstructive disease. 2. Stable cardiac enlargement CTPA No pulmonary embolism. 2. Large right pleural effusion with associated right lower lobe collapse and partial right middle lobe collapse. 3. Diffuse ground-glass attenuation within the aerated lungs, no mass lesion. This likely reflects edema  Significant Labs: Thoracentesis Labs: Lab Results  Component Value Date/Time   FLUIDTYPE PLEURAL FLUID CYTO 01/02/2024 11:40 AM   COLORFL YELLOW (A) 01/02/2024 11:40 AM   APPEARANCEFL HAZY (A) 01/02/2024 11:40 AM   WBCFLUID 579 01/02/2024 11:40 AM   FNEUT 76 (H) 01/02/2024 11:40 AM   LYMPHSFL 11 01/02/2024 11:40 AM   MONOMACSERFL 10 (L) 01/02/2024 11:40 AM   EOSFL 3 01/02/2024 11:40 AM   OTHERCELLSBF FEW PYKNOTIC NEUTROPHILS NOTED. 01/02/2024 11:40 AM   ALBFL 1.5 01/02/2024 11:40 AM   ALBUMIN 2.8 (L)  01/01/2024 09:20 AM   GLUCOSEFL 78 01/02/2024 11:40 AM   TPROTFLUID <3.0 01/02/2024 11:40 AM   PROT 6.8 12/30/2023 04:14 AM   LDHFL 343 (H) 01/02/2024 11:40 AM   SDES PLEURAL 01/01/2024 04:17 PM   SPECREQUEST PLEURAL 01/01/2024 04:17 PM   GRAMSTAIN NO WBC SEEN NO ORGANISMS SEEN  01/01/2024 04:17 PM   CULT  01/01/2024 04:17 PM    NO GROWTH 3 DAYS Performed at [facility], [address], [place], [place] [address]    REPTSTATUS 01/04/2024 FINAL 01/01/2024 04:17 PM   Significant Imaging Studies: 01-02-2024 CT chest Right pleural drainage catheter in place with small pneumothorax. The pleural effusion has been completely evacuated. 2. Significant airspace disease, most notably in the right middle lobe. Although this could be re-expansion edema, infiltrate/pneumonia is also possible. 3. Stable cardiac enlargement and pericardial effusion. 4. Age advanced vascular calcifications  Antibiotic Therapy: Anti-infectives (From admission, onward)    None       Procedures: 01-01-2024 right pigtail chest tube by IR 01-04-2024 chest tube removal  Consultants: Nephrology PCCM

## 2023-12-31 NOTE — Assessment & Plan Note (Addendum)
 12-31-2023 PCCM consulted. Per PCCM notes, they will ask IR for thoracentesis. Pt states someone told her that she is not getting thoracentesis. She isn't sure who said that. PCCM recommended extra fluid removal with HD. Nephrology made aware of PCCM recs.  01-01-2024 s/p chest tube placement. Defer to PCCM if she needs intra-pleural lytics. IV dilaudid  ordered x 1 for pain. Po dilaudid  prn q4h prn.  01-02-2024 Has another 400 ml from chest tube since last night.  Total output thus far has been about 1.2 liters(600 ml from initial chest tube insertion) and the 800 ml in her chest atrium thus far. Today's CXR shows lots of improvement in pleural effusion since chest tube placement. Awaiting for PCCM to decide if pt needs intra-pleural lytics today.  01-03-2024 PCCM plans for chest tube removal today. Pt can home go later this evening.  01-04-2024 chest removal delayed due to PCCM APP yesterday evening not qualified to remove chest tube with retaining string. Chest tube removed today. Pleural fluid analysis shows transudate. F/u with PCCM. No concern for pneumonia or empyema. Has never been febrile. No leukocytosis. Only reason pleural effusion was drained was due to increase in size since Dec 2024 and pt's inability to tolerated increase in UF during HD(due to increased fluid removal causing severe muscle cramping).

## 2023-12-31 NOTE — Assessment & Plan Note (Signed)
 12-31-2023 chronic.  01-01-2024 chronic.

## 2023-12-31 NOTE — Progress Notes (Signed)
 [facility] Progress Note  Subjective:  Seen in room HD yest w/ 2.3 L off , cramping SOB much better Off O2  Vitals:   12/30/23 1858 12/30/23 2014 12/31/23 0007 12/31/23 0424  BP: 122/78 117/78 113/75 106/71  Pulse: 84 92 92 86  Resp: 16 19 18 19   Temp: 98.4 F (36.9 C) 98.3 F (36.8 C) 98.3 F (36.8 C) 98.4 F (36.9 C)  TempSrc:      SpO2:  98% 98% 96%  Weight:      Height:        Exam: Gen alert, no distress, on RA No jvd or bruits Chest clear bilat to bases RRR no MRG Abd soft ntnd no mass or ascites +bs Ext bilat LE stumps, edema resolved Neuro is alert, Ox 3 , nf    AVF LUA +bruit           Renal-related home meds: Auryxia  2 AC 3 times daily Toprol -XL 100 mg daily Lokelma  10 g every MWF Others: Antivert , Zofran , PPI, albuterol       OP HD: [facility] MWF 3.5h   B300    33kg     AVF   Heparin  none  Last OP HD 6/02, post wt 33.8kg   Good compliance, comes off 0-1 kg over Vdra 2.5 mcg mwf Sensipar  60mg  mwf     Assessment/ Plan: SOB/vol overload: esrd pt w/ worsening large R pleural effusion, and CT showing GG changes which usually is early pulm edema. HR ^ was high w/ moving around. Had HD yest w/ 2.3 L off, today is feeling much better.  ESRD: on HD MWF. No missed HD. Had HD here yesterday. Next HD 1st shift Friday.  HTN: bp's stable, cont home meds Volume: will likely need her dry wt lowered, but how to get a good weight on her prior to discharge may be a problem  Anemia of esrd: Hb 11 here, no esa at OP unit. Follow.  Secondary hyperparathyroidism: CCa in range, cont binders ac     Rob Zana Hesselbach MD  [facility] 12/31/2023, 12:54 PM  Recent Labs  Lab 12/30/23 0414 12/30/23 2008 12/31/23 0628  HGB 11.7* 11.3* 11.6*  ALBUMIN 3.0*  --   --   CALCIUM  7.7*  --  8.5*  CREATININE 6.41* 3.13* 4.60*  K 4.3  --  4.0   No results for input(s): "IRON ", "TIBC", "FERRITIN" in the last 168 hours. Inpatient medications:  Chlorhexidine  Gluconate Cloth  6  each Topical Q0600   ferric citrate   420 mg Oral BID WC   heparin   5,000 Units Subcutaneous Q8H   metoprolol  succinate  100 mg Oral Daily   multivitamin  1 tablet Oral Daily   pantoprazole   40 mg Oral Daily   sodium zirconium cyclosilicate   10 g Oral Q M,W,F    ipratropium-albuterol , ondansetron 

## 2023-12-31 NOTE — Subjective & Objective (Addendum)
 Pt seen and examined. Chest tube removed today by IR. Pt and husband requesting to go home.

## 2023-12-31 NOTE — Assessment & Plan Note (Addendum)
 12-31-2023 chronic.  01-01-2024 chronic.  01-02-2024 stable  01-03-2024 stable  01-04-2024 stable.

## 2023-12-31 NOTE — Assessment & Plan Note (Addendum)
 12-31-2023 nephrology consulted for routine HD.  01-01-2024 had HD today(M W F schedule)  01-02-2024 stable. Will give po robaxin  due to muscle cramping.  Pt complaining of severe muscle cramping. She states she gets this when too much fluid is removed with HD   01-03-2024 stable. Continue outpatient HD on M, W, F.

## 2023-12-31 NOTE — Progress Notes (Signed)
 Pt refused Heparin  injection. Notified provider.

## 2023-12-31 NOTE — Care Management Obs Status (Signed)
 MEDICARE OBSERVATION STATUS NOTIFICATION   Patient Details  Name: [name] MRN: [id number] Date of Birth: 05-Jul-1991   Medicare Observation Status Notification Given:  Yes    Dane Dung, RN 12/31/2023, 12:14 PM

## 2023-12-31 NOTE — Progress Notes (Signed)
 Out Patient Arrangements:  Pt receives out-pt HD at [facility] on MWF.   Gable Johann [facility] [phone]

## 2023-12-31 NOTE — Progress Notes (Signed)
 Pt refused heparin  , provider mansy informed ,he said to document   and apply SCD

## 2023-12-31 NOTE — Care Management Obs Status (Cosign Needed)
 MEDICARE OBSERVATION STATUS NOTIFICATION   Patient Details  Name: KIMI BORDEAU MRN: [id number] Date of Birth: [birth year]   Medicare Observation Status Notification Given:  Yes    Dane Dung, RN 12/31/2023, 12:05 PM

## 2023-12-31 NOTE — Progress Notes (Addendum)
 PROGRESS NOTE    [name]  WUJ:[id number] DOB: 1990-09-24 DOA: 12/30/2023 PCP: [facility]  Subjective: Pt seen and examined. Nephrology consulted for ESRD and HD needs. PCCM consulted this AM to manage her recurrent right pleural effusion.  Pt on RA. Has had right pleural effusion since 06-2023.  No fevers. No chills. Pt on outpatient HD on M, W, F.  Pt states no more than 2.4 liters can be removed at outpatient HD without her getting cramping in her extremities.     Hospital Course: HPI:  [name] is a 33 y.o. female with medical history significant of spina bifida, HTN, ESRD (HD on MWF), and depression p/w SOB iso R pleural effusion.   Pt was in her USOH until this morning at 0300 when she experienced acute onset SOB while sitting up watching television for which pt activated EMS. Pt reports having her last HD session on Monday. Denies any sick contacts or recent medications changes.   In the ED, pt tachycardic and tachypneic on RA. Labs notable for BUN/Cr 42/6.41, BNP 1039, troponin 79-->78, and Hb 11.7.  CTA neg for PE, but did show large R pleural effusion with RLL and RML collapse. Pt admitted to medicine w/ Nephrology following.    Significant Events: Admitted 12/30/2023 for recurrent right pleural effusion   Admission Labs: WBC 7.1, HgB 11.7, plt 132 Na 140, K 4.3, CO2 of 24, BUN 42, scr 6.41, glu 88 TP 6.8, alb 3.0, AST 26, ALT 16, T. Bili 1.2 BNP 1039  Admission Imaging Studies: CXR Progressive right-sided airspace disease and pleural opacification. Most likely represents a loculated pleural effusion Airspace disease is concerning for pneumonia or post-obstructive disease. 2. Stable cardiac enlargement CTPA No pulmonary embolism. 2. Large right pleural effusion with associated right lower lobe collapse and partial right middle lobe collapse. 3. Diffuse ground-glass attenuation within the aerated lungs, no mass lesion. This likely reflects  edema  Significant Labs:   Significant Imaging Studies:   Antibiotic Therapy: Anti-infectives (From admission, onward)    None       Procedures:   Consultants: nephrology    Assessment and Plan: * Recurrent pleural effusion on right 12-31-2023 PCCM consulted. Per PCCM notes, they will ask IR for thoracentesis. Pt states someone told her that she is not getting thoracentesis. She isn't sure who said that. PCCM recommended extra fluid removal with HD. Nephrology made aware of PCCM recs.  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 Wheelchair dependent.  Spina bifida (HCC) 12-31-2023 chronic.  Essential hypertension 12-31-2023 on Toprol -XL 100 mg daily.  End stage renal disease on dialysis [facility]) 12-31-2023 nephrology consulted for routine HD.   DVT prophylaxis: Place and maintain sequential compression device Start: 12/31/23 0027 heparin  injection 5,000 Units Start: 12/30/23 1400    Code Status: Full Code Family Communication: discussed with pt and her significant other/spouse Nelson Disposition Plan: return home Reason for continuing need for hospitalization: PCCM consulted to manage her pleural effusion. Nephrology consulted for continued HD.  Objective: Vitals:   12/30/23 1858 12/30/23 2014 12/31/23 0007 12/31/23 0424  BP: 122/78 117/78 113/75 106/71  Pulse: 84 92 92 86  Resp: 16 19 18 19   Temp: 98.4 F (36.9 C) 98.3 F (36.8 C) 98.3 F (36.8 C) 98.4 F (36.9 C)  TempSrc:      SpO2:  98% 98% 96%  Weight:      Height:        Intake/Output Summary (Last 24 hours) at 12/31/2023 1328 Last data filed at 12/30/2023  1858 Gross per 24 hour  Intake --  Output 2300 ml  Net -2300 ml   Filed Weights   12/30/23 0408  Weight: 36 kg    Examination:  Physical Exam Vitals and nursing note reviewed.  Constitutional:      General: She is not in acute distress.    Appearance: She is not toxic-appearing or diaphoretic.     Comments: Pt playing game on her ipad  on her bed. Sitting.  HENT:     Head: Normocephalic and atraumatic.  Eyes:     General: No scleral icterus. Cardiovascular:     Rate and Rhythm: Normal rate.     Pulses: Normal pulses.     Heart sounds: Normal heart sounds.  Pulmonary:     Effort: Pulmonary effort is normal. No respiratory distress.     Breath sounds: Normal breath sounds. No wheezing.  Abdominal:     General: Bowel sounds are normal.     Palpations: Abdomen is soft.  Skin:    Capillary Refill: Capillary refill takes less than 2 seconds.  Neurological:     Mental Status: She is alert and oriented to person, place, and time.     Data Reviewed: I have personally reviewed following labs and imaging studies  CBC: Recent Labs  Lab 12/30/23 0414 12/30/23 2008 12/31/23 0628  WBC 7.1 5.4 5.8  HGB 11.7* 11.3* 11.6*  HCT 39.1 36.9 38.4  MCV 100.8* 98.7 101.3*  PLT 132* 125* 123*   Basic Metabolic Panel: Recent Labs  Lab 12/30/23 0414 12/30/23 2008 12/31/23 0628  NA 140  --  138  K 4.3  --  4.0  CL 97*  --  99  CO2 24  --  26  GLUCOSE 88  --  82  BUN 42*  --  27*  CREATININE 6.41* 3.13* 4.60*  CALCIUM  7.7*  --  8.5*   GFR: Estimated Creatinine Clearance: 2.8 mL/min (A) (by C-G formula based on SCr of 4.6 mg/dL (H)). Liver Function Tests: Recent Labs  Lab 12/30/23 0414  AST 26  ALT 16  ALKPHOS 114  BILITOT 1.2  PROT 6.8  ALBUMIN 3.0*   BNP (last 3 results) Recent Labs    07/15/23 0405 07/23/23 2000 12/30/23 0414  BNP 1,979.9* 2,478.9* 1,039.6*   CBG: Recent Labs  Lab 12/30/23 1751  GLUCAP 75   Sepsis Labs: Recent Labs  Lab 12/30/23 2008  PROCALCITON 0.96    Recent Results (from the past 240 hours)  MRSA Next Gen by PCR, Nasal     Status: None   Collection Time: 12/30/23  8:53 PM   Specimen: Nasal Mucosa; Nasal Swab  Result Value Ref Range Status   MRSA by PCR Next Gen NOT DETECTED NOT DETECTED Final    Comment: (NOTE) The GeneXpert MRSA Assay (FDA approved for NASAL  specimens only), is one component of a comprehensive MRSA colonization surveillance program. It is not intended to diagnose MRSA infection nor to guide or monitor treatment for MRSA infections. Test performance is not FDA approved in patients less than 6 years old. Performed at [facility], [address], [place], [place] [address]      Radiology Studies: CT Angio Chest PE W and/or Wo Contrast Result Date: 12/30/2023 EXAM: CTA of the Chest with contrast for PE 12/30/2023 07:24:38 AM TECHNIQUE: CTA of the chest was performed after the administration of intravenous contrast. Multiplanar reformatted images are provided for review. MIP images are provided for review. Automated exposure control,  iterative reconstruction, and/or weight based adjustment of the mA/kV was utilized to reduce the radiation dose to as low as reasonably achievable. COMPARISON: CT angio chest 03/31/2020 CLINICAL HISTORY: Pulmonary embolism (PE) suspected, high prob. female with history of spina bifida, CHF, pulmonary hypertension, mitral stenosis, bilateral AKAs, ESRD on hemodialysis Monday Wednesday Friday. She presents to the ER today for evaluation for shortness of breath that started several hours ago. She states she feels like she has too much fluid and needs dialysis early. FINDINGS: PULMONARY ARTERIES: Pulmonary arteries are adequately opacified for evaluation. No pulmonary embolism. Main pulmonary artery is normal in caliber. MEDIASTINUM: Heart is mildly enlarged. There is no acute abnormality of the thoracic aorta. LYMPH NODES: No mediastinal, hilar or axillary lymphadenopathy. LUNGS AND PLEURA: A large right pleural effusion is present. The right lower lobe is mostly collapsed. Partial collapse of the right middle lobe is present. Diffuse ground-glass attenuation is present within the aerated lungs. No mass lesion is present. UPPER ABDOMEN: Abdominal ascites is again noted. Heterogeneous appearance of the pancreas is  stable. SOFT TISSUES AND BONES: Caudal regression again noted. No acute bone or soft tissue abnormality. IMPRESSION: 1. No pulmonary embolism. 2. Large right pleural effusion with associated right lower lobe collapse and partial right middle lobe collapse. 3. Diffuse ground-glass attenuation within the aerated lungs, no mass lesion. This likely reflects edema. Electronically signed by: Audree Leas MD 12/30/2023 07:33 AM EDT RP Workstation: [id number]   DG Chest Port 1 View Result Date: 12/30/2023 EXAM: 1 VIEW XRAY OF THE CHEST 12/30/2023 04:30:00 AM COMPARISON: 1 view chest x-ray 11/26/2023. CLINICAL HISTORY: SOB. Pt BIB [facility] from home. Pt had sudden onset of SOB 2 hrs ago. Originally 88% on RA, 4L [place] applied to pt and O2 raised to 96%. Denies CP; Pt is MWF dialysis, pt reports receiving full treatment on Monday. FINDINGS: LUNGS AND PLEURA: Progressive right-sided airspace disease and pleural opacification is present. The left hemithorax is clear. HEART AND MEDIASTINUM: Cardiac enlargement is stable. BONES AND SOFT TISSUES: No acute osseous abnormality. IMPRESSION: 1. Progressive right-sided airspace disease and pleural opacification. Most likely represents a loculated pleural effusion Airspace disease is concerning for pneumonia or post-obstructive disease. 2. Stable cardiac enlargement. Electronically signed by: Audree Leas MD 12/30/2023 04:47 AM EDT RP Workstation: [id number]    Scheduled Meds:  [START ON 01/01/2024] Chlorhexidine  Gluconate Cloth  6 each Topical Q0600   ferric citrate   420 mg Oral BID WC   heparin   5,000 Units Subcutaneous Q8H   metoprolol  succinate  100 mg Oral Daily   multivitamin  1 tablet Oral Daily   pantoprazole   40 mg Oral Daily   sodium zirconium cyclosilicate   10 g Oral Q M,W,F   Continuous Infusions:   LOS: 1 day   Time spent: 50 minutes  Unk Garb, DO  [facility]  12/31/2023, 1:28 PM

## 2023-12-31 NOTE — Progress Notes (Signed)
 Transition of Care [facility]) - Inpatient Brief Assessment   Patient Details  Name: [name] MRN: [id number] Date of Birth: 08-02-1990  [facility] of [facility]) CM/SW Contact:    Dane Dung, RN Phone Number: 12/31/2023, 12:17 PM   Clinical Narrative: Cm met with the patient at the bedside and Medicare Observation notice provided and completed code 44.  Patient admitted to the hospital for right pleural effusion.   Patient states that she receives personal care services and Outpatient palliative care through [facility].  Patient has WC at home.  Patient uses [employer]	[place] for transportation needs including dialysis.  CM will continue to follow the patient for TOC needs to return home when stable.   Transition of Care Asessment: Insurance and Status: (P) Insurance coverage has been reviewed Patient has primary care physician: (P) Yes Home environment has been reviewed: (P) from home with spouse Prior level of function:: (P) WC Prior/Current Home Services: (P) Current home services (Active with personal care through through [facility] agency - pt unsure of provider,  [facility].) Social Drivers of Health Review: (P) SDOH reviewed interventions complete Readmission risk has been reviewed: (P) Yes Transition of care needs: (P) transition of care needs identified, TOC will continue to follow

## 2024-01-01 ENCOUNTER — Observation Stay (HOSPITAL_COMMUNITY)

## 2024-01-01 ENCOUNTER — Encounter (HOSPITAL_COMMUNITY): Payer: Self-pay | Admitting: Hospitalist

## 2024-01-01 DIAGNOSIS — I272 Pulmonary hypertension, unspecified: Secondary | ICD-10-CM | POA: Diagnosis present

## 2024-01-01 DIAGNOSIS — Z938 Other artificial opening status: Secondary | ICD-10-CM | POA: Diagnosis not present

## 2024-01-01 DIAGNOSIS — J939 Pneumothorax, unspecified: Secondary | ICD-10-CM | POA: Diagnosis present

## 2024-01-01 DIAGNOSIS — Q057 Lumbar spina bifida without hydrocephalus: Secondary | ICD-10-CM

## 2024-01-01 DIAGNOSIS — R0602 Shortness of breath: Secondary | ICD-10-CM | POA: Diagnosis present

## 2024-01-01 DIAGNOSIS — Z881 Allergy status to other antibiotic agents status: Secondary | ICD-10-CM | POA: Diagnosis not present

## 2024-01-01 DIAGNOSIS — Z992 Dependence on renal dialysis: Secondary | ICD-10-CM

## 2024-01-01 DIAGNOSIS — D631 Anemia in chronic kidney disease: Secondary | ICD-10-CM | POA: Diagnosis present

## 2024-01-01 DIAGNOSIS — N186 End stage renal disease: Secondary | ICD-10-CM

## 2024-01-01 DIAGNOSIS — I10 Essential (primary) hypertension: Secondary | ICD-10-CM | POA: Diagnosis not present

## 2024-01-01 DIAGNOSIS — Q059 Spina bifida, unspecified: Secondary | ICD-10-CM | POA: Diagnosis not present

## 2024-01-01 DIAGNOSIS — Z91040 Latex allergy status: Secondary | ICD-10-CM | POA: Diagnosis not present

## 2024-01-01 DIAGNOSIS — Z79899 Other long term (current) drug therapy: Secondary | ICD-10-CM | POA: Diagnosis not present

## 2024-01-01 DIAGNOSIS — Z89612 Acquired absence of left leg above knee: Secondary | ICD-10-CM | POA: Diagnosis not present

## 2024-01-01 DIAGNOSIS — R000 Tachycardia, unspecified: Secondary | ICD-10-CM | POA: Diagnosis present

## 2024-01-01 DIAGNOSIS — Z89611 Acquired absence of right leg above knee: Secondary | ICD-10-CM

## 2024-01-01 DIAGNOSIS — N2581 Secondary hyperparathyroidism of renal origin: Secondary | ICD-10-CM | POA: Diagnosis present

## 2024-01-01 DIAGNOSIS — I3139 Other pericardial effusion (noninflammatory): Secondary | ICD-10-CM | POA: Diagnosis present

## 2024-01-01 DIAGNOSIS — Z8249 Family history of ischemic heart disease and other diseases of the circulatory system: Secondary | ICD-10-CM | POA: Diagnosis not present

## 2024-01-01 DIAGNOSIS — I132 Hypertensive heart and chronic kidney disease with heart failure and with stage 5 chronic kidney disease, or end stage renal disease: Secondary | ICD-10-CM | POA: Diagnosis present

## 2024-01-01 DIAGNOSIS — F17210 Nicotine dependence, cigarettes, uncomplicated: Secondary | ICD-10-CM | POA: Diagnosis present

## 2024-01-01 DIAGNOSIS — I5042 Chronic combined systolic (congestive) and diastolic (congestive) heart failure: Secondary | ICD-10-CM | POA: Diagnosis present

## 2024-01-01 DIAGNOSIS — G4733 Obstructive sleep apnea (adult) (pediatric): Secondary | ICD-10-CM | POA: Diagnosis present

## 2024-01-01 DIAGNOSIS — J90 Pleural effusion, not elsewhere classified: Secondary | ICD-10-CM | POA: Diagnosis not present

## 2024-01-01 DIAGNOSIS — Z993 Dependence on wheelchair: Secondary | ICD-10-CM | POA: Diagnosis not present

## 2024-01-01 DIAGNOSIS — I050 Rheumatic mitral stenosis: Secondary | ICD-10-CM | POA: Diagnosis present

## 2024-01-01 HISTORY — PX: IR PERC PLEURAL DRAIN W/INDWELL CATH W/IMG GUIDE: IMG5383

## 2024-01-01 LAB — RENAL FUNCTION PANEL
Albumin: 2.8 g/dL — ABNORMAL LOW (ref 3.5–5.0)
Anion gap: 14 (ref 5–15)
BUN: 40 mg/dL — ABNORMAL HIGH (ref 6–20)
CO2: 25 mmol/L (ref 22–32)
Calcium: 8 mg/dL — ABNORMAL LOW (ref 8.9–10.3)
Chloride: 100 mmol/L (ref 98–111)
Creatinine, Ser: 6.45 mg/dL — ABNORMAL HIGH (ref 0.44–1.00)
GFR, Estimated: 8 mL/min — ABNORMAL LOW (ref >60)
Glucose, Bld: 122 mg/dL — ABNORMAL HIGH (ref 70–99)
Phosphorus: 6.5 mg/dL — ABNORMAL HIGH (ref 2.5–4.6)
Potassium: 4.3 mmol/L (ref 3.5–5.1)
Sodium: 139 mmol/L (ref 135–145)

## 2024-01-01 LAB — CBC
HCT: 36.5 % (ref 36.0–46.0)
Hemoglobin: 11.2 g/dL — ABNORMAL LOW (ref 12.0–15.0)
MCH: 31 pg (ref 26.0–34.0)
MCHC: 30.7 g/dL (ref 30.0–36.0)
MCV: 101.1 fL — ABNORMAL HIGH (ref 80.0–100.0)
Platelets: 132 10*3/uL — ABNORMAL LOW (ref 150–400)
RBC: 3.61 MIL/uL — ABNORMAL LOW (ref 3.87–5.11)
RDW: 15.9 % — ABNORMAL HIGH (ref 11.5–15.5)
WBC: 5.3 10*3/uL (ref 4.0–10.5)
nRBC: 0 % (ref 0.0–0.2)

## 2024-01-01 LAB — HEPATITIS B SURFACE ANTIBODY, QUANTITATIVE: Hep B S AB Quant (Post): 416 m[IU]/mL

## 2024-01-01 LAB — GLUCOSE, CAPILLARY: Glucose-Capillary: 111 mg/dL — ABNORMAL HIGH (ref 70–99)

## 2024-01-01 MED ORDER — HYDROMORPHONE HCL 2 MG PO TABS
2.0000 mg | ORAL_TABLET | ORAL | Status: DC | PRN
  Administered 2024-01-01 – 2024-01-02 (×4): 2 mg via ORAL
  Filled 2024-01-01 (×5): qty 1

## 2024-01-01 MED ORDER — MIDAZOLAM HCL 2 MG/2ML IJ SOLN
INTRAMUSCULAR | Status: AC
  Filled 2024-01-01: qty 2

## 2024-01-01 MED ORDER — HYDROMORPHONE HCL 1 MG/ML IJ SOLN
1.0000 mg | Freq: Once | INTRAMUSCULAR | Status: AC
  Administered 2024-01-01: 1 mg via INTRAVENOUS
  Filled 2024-01-01: qty 1

## 2024-01-01 MED ORDER — ONDANSETRON HCL 4 MG PO TABS
8.0000 mg | ORAL_TABLET | Freq: Three times a day (TID) | ORAL | Status: DC | PRN
  Administered 2024-01-01 – 2024-01-02 (×2): 8 mg via ORAL
  Filled 2024-01-01 (×3): qty 2

## 2024-01-01 MED ORDER — LIDOCAINE HCL 1 % IJ SOLN
INTRAMUSCULAR | Status: AC
  Filled 2024-01-01: qty 20

## 2024-01-01 MED ORDER — HEPARIN SODIUM (PORCINE) 5000 UNIT/ML IJ SOLN
5000.0000 [IU] | Freq: Three times a day (TID) | INTRAMUSCULAR | Status: DC
  Filled 2024-01-01 (×2): qty 1

## 2024-01-01 MED ORDER — HYDROMORPHONE HCL 1 MG/ML IJ SOLN
1.0000 mg | INTRAMUSCULAR | Status: DC | PRN
  Administered 2024-01-02 – 2024-01-04 (×3): 1 mg via INTRAVENOUS
  Filled 2024-01-01 (×3): qty 1

## 2024-01-01 MED ORDER — ONDANSETRON HCL 4 MG/2ML IJ SOLN
4.0000 mg | INTRAMUSCULAR | Status: DC | PRN
  Filled 2024-01-01: qty 2

## 2024-01-01 MED ORDER — FENTANYL CITRATE (PF) 100 MCG/2ML IJ SOLN
INTRAMUSCULAR | Status: AC
  Filled 2024-01-01: qty 2

## 2024-01-01 MED ORDER — LIDOCAINE HCL 1 % IJ SOLN
20.0000 mL | Freq: Once | INTRAMUSCULAR | Status: AC
  Administered 2024-01-01: 10 mL

## 2024-01-01 MED ORDER — MIDAZOLAM HCL 2 MG/2ML IJ SOLN
1.0000 mg | Freq: Once | INTRAMUSCULAR | Status: AC
  Administered 2024-01-01: 1 mg via INTRAVENOUS

## 2024-01-01 MED ORDER — BENZONATATE 100 MG PO CAPS
100.0000 mg | ORAL_CAPSULE | Freq: Two times a day (BID) | ORAL | Status: DC | PRN
  Administered 2024-01-01: 100 mg via ORAL
  Filled 2024-01-01: qty 1

## 2024-01-01 MED ORDER — ONDANSETRON HCL 4 MG/2ML IJ SOLN: 4.0000 mg | Freq: Four times a day (QID) | INTRAMUSCULAR | Status: DC | PRN

## 2024-01-01 NOTE — Assessment & Plan Note (Addendum)
 01-01-2024 placed due to loculated pleural effusion. Management per PCCM.  01-02-2024 Has another 400 ml from chest tube since last night.  Total output thus far has been about 1.2 liters(600 ml from initial chest tube insertion) and the 800 ml in her chest atrium thus far. Today's CXR shows lots of improvement in pleural effusion since chest tube placement. Awaiting for PCCM to decide if pt needs intra-pleural lytics today.  01-03-2024 PCCM plans for chest tube removal today.  01-04-2024 chest tube removed today. Resolved.

## 2024-01-01 NOTE — Progress Notes (Signed)
   01/01/24 1300  Vitals  Temp 97.7 F (36.5 C)  Pulse Rate 79  Resp (!) 22  BP 103/72  SpO2 100 %  O2 Device Room Air  Weight 34.4 kg  Type of Weight Post-Dialysis  Post Treatment  Dialyzer Clearance Lightly streaked  Hemodialysis Intake (mL) 0 mL  Liters Processed 56.2  Fluid Removed (mL) 1400 mL  Tolerated HD Treatment Yes  Post-Hemodialysis Comments Pt complained of cramping at last 4 minutes of treatment, uf turned off, pt did not want fluid.  AVG/AVF Arterial Site Held (minutes) 4 minutes  AVG/AVF Venous Site Held (minutes) 4 minutes   Received patient in bed to unit.  Alert and oriented.  Informed consent signed and in chart.   TX duration: Three hours and fifteen minutes  Patient tolerated well.  Transported back to the room  Alert, without acute distress.  Hand-off given to patient's nurse.   Access used: Left upper arm fistula Access issues: None

## 2024-01-01 NOTE — Progress Notes (Signed)
 [facility] Progress Note  Subjective:  Seen in HD Had 1.4 L off this am, pt can't tolerated much more due to her size Off O2   Vitals:   01/01/24 1200 01/01/24 1230 01/01/24 1253 01/01/24 1300  BP: 106/82 106/75 113/76 103/72  Pulse: 73 71 79 79  Resp: (!) 30 (!) 24 12 (!) 22  Temp:    97.7 F (36.5 C)  TempSrc:      SpO2: 96% 91% 100% 100%  Weight:    34.4 kg  Height:        Exam: Gen alert, no distress, on RA No jvd or bruits Chest clear bilat to bases RRR no MRG Abd soft ntnd no mass or ascites +bs Ext bilat LE stumps, edema resolved Neuro is alert, Ox 3 , nf    AVF LUA +bruit      Renal-related home meds: Auryxia  2 AC 3 times daily Toprol -XL 100 mg daily Lokelma  10 g every MWF Others: Antivert , Zofran , PPI, albuterol       OP HD: [facility] MWF 3.5h   B300    33kg     AVF   Heparin  none  Last OP HD 6/02, post wt 33.8kg   Good compliance, comes off 0-1 kg over Vdra 2.5 mcg mwf Sensipar  60mg  mwf     Assessment/ Plan: SOB/vol overload: esrd pt w/ R pleural effusion, and CT showing GG changes which usually is early pulm edema in esrd pts. Had HD 6/04 w/ 2.3 L off, and again today w/ 1.4 L off. She is not on O2. IR is seeing her for possible chest tube for large R effusion.  ESRD: on HD MWF. Had HD here Wed and today. Next HD Monday.  HTN: bp's stable, cont home meds Volume: may need dry wt lowered in OP setting, but we can't get wts here so will have to rely on the OP scales Anemia of esrd: Hb 11 here, no esa at OP unit. Follow.  Secondary hyperparathyroidism: CCa in range, cont binders ac     Rob Zana Hesselbach MD  [facility] 01/01/2024, 2:18 PM  Recent Labs  Lab 12/30/23 0414 12/30/23 2008 12/31/23 0628 01/01/24 0900 01/01/24 0920  HGB 11.7*   < > 11.6* 11.2*  --   ALBUMIN 3.0*  --   --   --  2.8*  CALCIUM  7.7*  --  8.5*  --  8.0*  PHOS  --   --   --   --  6.5*  CREATININE 6.41*   < > 4.60*  --  6.45*  K 4.3  --  4.0  --  4.3   < > = values in this  interval not displayed.   No results for input(s): "IRON ", "TIBC", "FERRITIN" in the last 168 hours. Inpatient medications:  Chlorhexidine  Gluconate Cloth  6 each Topical Q0600   ferric citrate   420 mg Oral BID WC   heparin   5,000 Units Subcutaneous Q8H   metoprolol  succinate  100 mg Oral Daily   multivitamin  1 tablet Oral Daily   pantoprazole   40 mg Oral Daily   sodium zirconium cyclosilicate   10 g Oral Q M,W,F    ipratropium-albuterol , ondansetron 

## 2024-01-01 NOTE — Progress Notes (Addendum)
 PROGRESS NOTE    [name]  ZOX:[id number] DOB: 1991-04-17 DOA: 12/30/2023 PCP: [facility]  Subjective: Pt seen and examined. Pt had right chest tube placed by IR today. Already drained out about 200 ml in chest tube atrium.  IR notes document that 600 ml was removed with placement.(Total about 800 ml thus far) PCCM aware of chest tube placement.  Pt c/o chest tube site pain. IV dilaudid  x 1 and po dilaudid  prn ordered.  Not sure why pt wasn't on renal diet(on regular diet), but this was changed to renal diet.   Hospital Course: HPI:  [name] is a 33 y.o. female with medical history significant of spina bifida, HTN, ESRD (HD on MWF), and depression p/w SOB iso R pleural effusion.   Pt was in her USOH until this morning at 0300 when she experienced acute onset SOB while sitting up watching television for which pt activated EMS. Pt reports having her last HD session on Monday. Denies any sick contacts or recent medications changes.   In the ED, pt tachycardic and tachypneic on RA. Labs notable for BUN/Cr 42/6.41, BNP 1039, troponin 79-->78, and Hb 11.7.  CTA neg for PE, but did show large R pleural effusion with RLL and RML collapse. Pt admitted to medicine w/ Nephrology following.    Significant Events: Admitted 12/30/2023 for recurrent right pleural effusion   Admission Labs: WBC 7.1, HgB 11.7, plt 132 Na 140, K 4.3, CO2 of 24, BUN 42, scr 6.41, glu 88 TP 6.8, alb 3.0, AST 26, ALT 16, T. Bili 1.2 BNP 1039  Admission Imaging Studies: CXR Progressive right-sided airspace disease and pleural opacification. Most likely represents a loculated pleural effusion Airspace disease is concerning for pneumonia or post-obstructive disease. 2. Stable cardiac enlargement CTPA No pulmonary embolism. 2. Large right pleural effusion with associated right lower lobe collapse and partial right middle lobe collapse. 3. Diffuse ground-glass attenuation within the aerated lungs,  no mass lesion. This likely reflects edema  Significant Labs:   Significant Imaging Studies:   Antibiotic Therapy: Anti-infectives (From admission, onward)    None       Procedures: 01-01-2024 right pigtail chest tube by IR  Consultants: nephrology    Assessment and Plan: * Recurrent pleural effusion on right 12-31-2023 PCCM consulted. Per PCCM notes, they will ask IR for thoracentesis. Pt states someone told her that she is not getting thoracentesis. She isn't sure who said that. PCCM recommended extra fluid removal with HD. Nephrology made aware of PCCM recs.  01-01-2024 s/p chest tube placement. Defer to PCCM if she needs intra-pleural lytics. IV dilaudid  ordered x 1 for pain. Po dilaudid  prn q4h prn.  S/P chest tube placement [facility]) - 01-01-2024 01-01-2024 placed due to loculated pleural effusion. Management per PCCM.  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 Wheelchair dependent.  01-01-2024 chronic.  Spina bifida (HCC) 12-31-2023 chronic.  01-01-2024 chronic.  Essential hypertension 12-31-2023 on Toprol -XL 100 mg daily.  01-01-2024 stable.  End stage renal disease on dialysis [facility]) 12-31-2023 nephrology consulted for routine HD.  01-01-2024 had HD today(M W F schedule)   DVT prophylaxis: heparin  injection 5,000 Units Start: 01/02/24 0600 Place and maintain sequential compression device Start: 12/31/23 0027    Code Status: Full Code Family Communication: discussed with pt's husband nelson at bedside Disposition Plan: home Reason for continuing need for hospitalization: now has chest tube to suction.  Objective: Vitals:   01/01/24 1555 01/01/24 1606 01/01/24 1608 01/01/24 1631  BP: (!) 77/55 110/77 105/63 103/73  Pulse: 80 80  98  Resp: (!) 22 (!) 25  20  Temp:    97.8 F (36.6 C)  TempSrc:    Oral  SpO2: 100% 99%  97%  Weight:      Height:        Intake/Output Summary (Last 24 hours) at 01/01/2024 1857 Last data filed at 01/01/2024  1500 Gross per 24 hour  Intake 300 ml  Output 1400 ml  Net -1100 ml   Filed Weights   01/01/24 0648 01/01/24 0854 01/01/24 1300  Weight: 45.5 kg 35.8 kg 34.4 kg    Examination:  Physical Exam Vitals and nursing note reviewed.  Constitutional:      Comments: Chronically ill appearing. Actively vomiting  HENT:     Head: Normocephalic and atraumatic.  Cardiovascular:     Rate and Rhythm: Normal rate and regular rhythm.  Pulmonary:     Effort: Pulmonary effort is normal.     Breath sounds: Normal breath sounds.  Abdominal:     General: Abdomen is flat.  Neurological:     Mental Status: She is alert and oriented to person, place, and time.     Data Reviewed: I have personally reviewed following labs and imaging studies  CBC: Recent Labs  Lab 12/30/23 0414 12/30/23 2008 12/31/23 0628 01/01/24 0900  WBC 7.1 5.4 5.8 5.3  HGB 11.7* 11.3* 11.6* 11.2*  HCT 39.1 36.9 38.4 36.5  MCV 100.8* 98.7 101.3* 101.1*  PLT 132* 125* 123* 132*   Basic Metabolic Panel: Recent Labs  Lab 12/30/23 0414 12/30/23 2008 12/31/23 0628 01/01/24 0920  NA 140  --  138 139  K 4.3  --  4.0 4.3  CL 97*  --  99 100  CO2 24  --  26 25  GLUCOSE 88  --  82 122*  BUN 42*  --  27* 40*  CREATININE 6.41* 3.13* 4.60* 6.45*  CALCIUM  7.7*  --  8.5* 8.0*  PHOS  --   --   --  6.5*   GFR: Estimated Creatinine Clearance: 1.9 mL/min (A) (by C-G formula based on SCr of 6.45 mg/dL (H)). Liver Function Tests: Recent Labs  Lab 12/30/23 0414 01/01/24 0920  AST 26  --   ALT 16  --   ALKPHOS 114  --   BILITOT 1.2  --   PROT 6.8  --   ALBUMIN 3.0* 2.8*   BNP (last 3 results) Recent Labs    07/15/23 0405 07/23/23 2000 12/30/23 0414  BNP 1,979.9* 2,478.9* 1,039.6*   CBG: Recent Labs  Lab 12/30/23 1751 01/01/24 0057  GLUCAP 75 111*   Sepsis Labs: Recent Labs  Lab 12/30/23 2008  PROCALCITON 0.96    Recent Results (from the past 240 hours)  MRSA Next Gen by PCR, Nasal     Status: None    Collection Time: 12/30/23  8:53 PM   Specimen: Nasal Mucosa; Nasal Swab  Result Value Ref Range Status   MRSA by PCR Next Gen NOT DETECTED NOT DETECTED Final    Comment: (NOTE) The GeneXpert MRSA Assay (FDA approved for NASAL specimens only), is one component of a comprehensive MRSA colonization surveillance program. It is not intended to diagnose MRSA infection nor to guide or monitor treatment for MRSA infections. Test performance is not FDA approved in patients less than 37 years old. Performed at [facility], [address], [place], [place] [address]   Body fluid culture w Gram Stain     Status: None (  Preliminary result)   Collection Time: 01/01/24  4:17 PM   Specimen: Pleura  Result Value Ref Range Status   Specimen Description PLEURAL  Final   Special Requests PLEURAL  Final   Gram Stain   Final    NO WBC SEEN NO ORGANISMS SEEN Performed at [facility], [address] [place], [place] [address]    Culture PENDING  Incomplete   Report Status PENDING  Incomplete     Radiology Studies: IR [facility] PLEURAL DRAIN W/INDWELL CATH W/IMG GUIDE Result Date: 01/01/2024 INDICATION: 33 year old female with complicated right anterior pleural effusion. She presents for placement of a pigtail chest tube. EXAM: Ultrasound and fluoroscopy guided pigtail chest tube placement MEDICATIONS: The patient is currently admitted to the hospital and receiving intravenous antibiotics. The antibiotics were administered within an appropriate time frame prior to the initiation of the procedure. ANESTHESIA/SEDATION: None. COMPLICATIONS: None immediate. PROCEDURE: Informed written consent was obtained from the patient after a thorough discussion of the procedural risks, benefits and alternatives. All questions were addressed. Maximal Sterile Barrier Technique was utilized including caps, mask, sterile gowns, sterile gloves, sterile drape, hand hygiene and skin antiseptic. A timeout was performed  prior to the initiation of the procedure. The right chest was interrogated with ultrasound. The complex parole effusion is identified. A suitable skin entry site was selected and marked. Local anesthesia was attained by infiltration with 1% lidocaine . A small dermatotomy was made. Under real-time ultrasound guidance, an 18 gauge trocar needle was carefully advanced over a rib and into the pleural space. A 0.035 wire was advanced into the pleural space. The needle was removed. The tract was dilated to 10 [place]. A skater 10 [place] all-purpose drainage catheter was advanced over the wire and formed. Aspiration was then performed yielding 600 mL of bloody pleural fluid. The catheter was then connected to an atrium and secured to the skin with 0 Prolene suture. Bandages were applied. IMPRESSION: Successful placement of a 10 French percutaneous pigtail chest tube into the complex right pleural effusion. Successful evacuation of 600 mL bloody pleural fluid. A sample was sent for culture. Electronically Signed   By: Fernando Hoyer M.D.   On: 01/01/2024 16:21    Scheduled Meds:  Chlorhexidine  Gluconate Cloth  6 each Topical Q0600   ferric citrate   420 mg Oral BID WC   [START ON 01/02/2024] heparin   5,000 Units Subcutaneous Q8H   metoprolol  succinate  100 mg Oral Daily   multivitamin  1 tablet Oral Daily   pantoprazole   40 mg Oral Daily   sodium zirconium cyclosilicate   10 g Oral Q M,W,F   Continuous Infusions:   LOS: 1 day   Time spent: 50 minutes  Unk Garb, DO  [facility]  01/01/2024, 6:57 PM

## 2024-01-01 NOTE — Procedures (Signed)
 Interventional Radiology Procedure Note  Procedure: Placement of a 59F RIGHT chest tube with US  and fluoro guidance.  600 mL bloody pleural fluid evacuated  Complications: None  Estimated Blood Loss: None  Recommendations: - Tube to wall suction - CXR tomorrow   Signed,  Roxie Cord, MD

## 2024-01-01 NOTE — Consult Note (Signed)
 Chief Complaint: Patient was seen in consultation today for recurrent right-sided pleural effusion with possible loculations, with consideration for chest tube placement.  Referring Provider(s): Dr. Orbie Binder, MD   Supervising Physician: Fernando Hoyer  Patient Status: [facility] - In-pt  Patient is Full Code  History of Present Illness: [name] is a 33 y.o. female  with PMHx notable for HTN, anemia of chronic disease, ESRD on HD, OSA, asthma, GERD, spina bifida with caudal regression syndrome, and depression.  Per Dr. Cari Char attestation of Mr. Minor's consult note on 46/5: "33 year old ESRD on HD whom we are seeing for acute on chronic pleural effusion.   Pleural effusion, albeit a bit smaller notable 05/2023.  Review of prior CT scans in 2023 and earlier in 2024 I do not see any pleural fluid.  In the ED CTA PE protocol obtained that shows larger loculated anterior pleural effusion.  This prompted consultation."   Interventional Radiology was requested for chest tube placement vs. thoracentesis. Request was reviewed and approved for chest tube placement by Dr. Marne Sings. Patient is scheduled for same in IR today.   Patient is alert and laying in bed, calm.  Patient is currently without any significant complaints.     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   Depression 05/14/2015   Dialysis care    ESRD (end stage renal disease) on dialysis [facility])    GERD (gastroesophageal reflux disease) 01/07/2017   HTN (hypertension) 05/02/2011   Murmur, cardiac 07/17/2022   OSA (obstructive sleep apnea) 10/26/2023   Pneumonia, unspecified organism 07/27/2023   Spina bifida    UTI (lower urinary tract infection)    Viral URI with cough 09/15/2022    Past Surgical History:  Procedure Laterality Date    A/V FISTULAGRAM Left 10/14/2021   Procedure: A/V Fistulagram;  Surgeon: Adine Hoof, MD;  Location: [facility] INVASIVE CV LAB;  Service: Cardiovascular;  Laterality: Left;   AV FISTULA PLACEMENT     left arm   INSERTION OF DIALYSIS CATHETER N/A 05/08/2020   Procedure: ATTEMPTED, UNSUCCESSFUL INSERTION OF DIALYSIS CATHETER TUNNELED RIGHT INTERNAL JUGULAR;  Surgeon: Awilda Bogus, MD;  Location: [facility];  Service: General;  Laterality: N/A;   IR FLUORO GUIDE CV LINE RIGHT  05/09/2020   IR US  GUIDE VASC ACCESS RIGHT  05/09/2020   RIGHT HEART CATH N/A 06/30/2023   Procedure: RIGHT HEART CATH;  Surgeon: Darlis Eisenmenger, MD;  Location: [facility] INVASIVE CV LAB;  Service: Cardiovascular;  Laterality: N/A;    Allergies: Ciprofloxacin ; Other; Peanut -containing drug products; Aleve [naproxen sodium]; Ceftriaxone ; Coconut (cocos nucifera); Influenza vaccines; Tetanus toxoid, adsorbed; Tetanus toxoids; and Latex  Medications: Prior to Admission medications   Medication Sig Start Date End Date Taking? Authorizing Provider  albuterol  (PROVENTIL ) (2.5 MG/3ML) 0.083% nebulizer solution Take 3 mLs (2.5 mg total) by nebulization every 6 (six) hours as needed for wheezing or shortness of breath. 07/28/23  Yes Austine Lefort, MD  albuterol  (VENTOLIN  HFA) 108 (90 Base) MCG/ACT inhaler Inhale 1-2 puffs into the lungs every 6 (six) hours as needed for wheezing or shortness of breath. Patient taking differently: Inhale 2 puffs into the lungs every 6 (six) hours as needed for wheezing or shortness of breath. 09/15/22  Yes Jenelle Mis, FNP  AURYXIA  1 GM 210 MG(Fe) tablet  Take 420 mg by mouth 2 (two) times daily with a meal. 04/15/18  Yes [provider]  B Complex-C-Folic Acid  (DIALYVITE  800) 0.8 MG TABS Take 1 tablet by mouth daily. 12/07/23  Yes [provider]  EPINEPHrine  0.3 mg/0.3 mL IJ SOAJ injection Inject 0.3 mg into the muscle as needed for anaphylaxis. 04/23/23  Yes Jenelle Mis,  FNP  metoprolol  succinate (TOPROL -XL) 100 MG 24 hr tablet Take 100 mg by mouth daily. 12/29/19  Yes [provider]  omeprazole (PRILOSEC) 40 MG capsule Take 40 mg by mouth in the morning and at bedtime. 01/08/22  Yes [provider]  ondansetron  (ZOFRAN ) 4 MG tablet Take 4 mg by mouth every 8 (eight) hours as needed for nausea or vomiting. 12/09/23  Yes [provider]  sodium zirconium cyclosilicate  (LOKELMA ) 5 g packet Take 10 g by mouth every Monday, Wednesday, and Friday. 07/20/21  Yes [provider]  meclizine  (ANTIVERT ) 12.5 MG tablet Take 1 tablet (12.5 mg total) by mouth 2 (two) times daily as needed for dizziness. Patient not taking: Reported on 12/30/2023 11/26/23   Alissa April, MD     Family History  Problem Relation Age of Onset   Kidney cancer Other    Hypertension Maternal Grandmother    Arthritis Maternal Grandmother    Breast cancer Maternal Aunt    Colon cancer Neg Hx     Social History   Socioeconomic History   Marital status: Married    Spouse name: Not on file   Number of children: Not on file   Years of education: Not on file   Highest education level: Not on file  Occupational History   Not on file  Tobacco Use   Smoking status: Every Day    Current packs/day: 0.20    Average packs/day: 0.2 packs/day for 14.0 years (2.8 ttl pk-yrs)    Types: Cigarettes    Passive exposure: Current   Smokeless tobacco: Never   Tobacco comments:    2 cigs a day  Substance and Sexual Activity   Alcohol use: No   Drug use: Yes    Types: Marijuana   Sexual activity: Never  Other Topics Concern   Not on file  Social History Narrative   Not on file   Social Drivers of Health   Financial Resource Strain: Not on file  Food Insecurity: No Food Insecurity (12/30/2023)   Hunger Vital Sign    Worried About Running Out of Food in the Last Year: Never true    Ran Out of Food in the Last Year: Never true  Transportation Needs: No Transportation  Needs (12/30/2023)   PRAPARE - Administrator, Civil Service (Medical): No    Lack of Transportation (Non-Medical): No  Physical Activity: Not on file  Stress: Not on file  Social Connections: Unknown (11/26/2021)   Received from [facility]   Social Network    Social Network: Not on file     Review of Systems: A 12 point ROS discussed and pertinent positives are indicated in the HPI above.  All other systems are negative.  Vital Signs: BP 113/76   Pulse 96   Temp 97.6 F (36.4 C)   Resp (!) 22   Ht 3\' 1"  (0.94 m)   Wt 78 lb 14.8 oz (35.8 kg)   SpO2 100%   BMI 40.53 kg/m   Advance Care Plan: The advanced care place/surrogate decision maker was discussed at the time of visit  and the patient did not wish to discuss or was not able to name a surrogate decision maker or provide an advance care plan.  Physical Exam Vitals and nursing note reviewed.  Constitutional:      Appearance: Normal appearance.  HENT:     Mouth/Throat:     Mouth: Mucous membranes are moist.     Pharynx: Oropharynx is clear.  Cardiovascular:     Rate and Rhythm: Normal rate.  Pulmonary:     Effort: Pulmonary effort is normal.     Breath sounds: Normal breath sounds.  Abdominal:     Palpations: Abdomen is soft.     Tenderness: There is no abdominal tenderness.  Musculoskeletal:     Comments: Without bil lower extremities  Fistula in LUE noted  Skin:    General: Skin is warm and dry.  Neurological:     Mental Status: She is alert and oriented to person, place, and time. Mental status is at baseline.     Imaging: CT Angio Chest PE W and/or Wo Contrast Result Date: 12/30/2023 EXAM: CTA of the Chest with contrast for PE 12/30/2023 07:24:38 AM TECHNIQUE: CTA of the chest was performed after the administration of intravenous contrast. Multiplanar reformatted images are provided for review. MIP images are provided for review. Automated exposure control, iterative  reconstruction, and/or weight based adjustment of the mA/kV was utilized to reduce the radiation dose to as low as reasonably achievable. COMPARISON: CT angio chest 03/31/2020 CLINICAL HISTORY: Pulmonary embolism (PE) suspected, high prob. female with history of spina bifida, CHF, pulmonary hypertension, mitral stenosis, bilateral AKAs, ESRD on hemodialysis Monday Wednesday Friday. She presents to the ER today for evaluation for shortness of breath that started several hours ago. She states she feels like she has too much fluid and needs dialysis early. FINDINGS: PULMONARY ARTERIES: Pulmonary arteries are adequately opacified for evaluation. No pulmonary embolism. Main pulmonary artery is normal in caliber. MEDIASTINUM: Heart is mildly enlarged. There is no acute abnormality of the thoracic aorta. LYMPH NODES: No mediastinal, hilar or axillary lymphadenopathy. LUNGS AND PLEURA: A large right pleural effusion is present. The right lower lobe is mostly collapsed. Partial collapse of the right middle lobe is present. Diffuse ground-glass attenuation is present within the aerated lungs. No mass lesion is present. UPPER ABDOMEN: Abdominal ascites is again noted. Heterogeneous appearance of the pancreas is stable. SOFT TISSUES AND BONES: Caudal regression again noted. No acute bone or soft tissue abnormality. IMPRESSION: 1. No pulmonary embolism. 2. Large right pleural effusion with associated right lower lobe collapse and partial right middle lobe collapse. 3. Diffuse ground-glass attenuation within the aerated lungs, no mass lesion. This likely reflects edema. Electronically signed by: Audree Leas MD 12/30/2023 07:33 AM EDT RP Workstation: [id number]   DG Chest Port 1 View Result Date: 12/30/2023 EXAM: 1 VIEW XRAY OF THE CHEST 12/30/2023 04:30:00 AM COMPARISON: 1 view chest x-ray 11/26/2023. CLINICAL HISTORY: SOB. Pt BIB [facility] from home. Pt had sudden onset of SOB 2 hrs ago. Originally 88% on RA, 4L [place] applied  to pt and O2 raised to 96%. Denies CP; Pt is MWF dialysis, pt reports receiving full treatment on Monday. FINDINGS: LUNGS AND PLEURA: Progressive right-sided airspace disease and pleural opacification is present. The left hemithorax is clear. HEART AND MEDIASTINUM: Cardiac enlargement is stable. BONES AND SOFT TISSUES: No acute osseous abnormality. IMPRESSION: 1. Progressive right-sided airspace disease and pleural opacification. Most likely represents a loculated pleural effusion Airspace disease is concerning for pneumonia or post-obstructive disease.  2. Stable cardiac enlargement. Electronically signed by: Audree Leas MD 12/30/2023 04:47 AM EDT RP Workstation: [id number]   Sleep Study Documents Result Date: 12/09/2023 Ordered by an unspecified provider.   Labs:  CBC: Recent Labs    12/30/23 0414 12/30/23 2008 12/31/23 0628 01/01/24 0900  WBC 7.1 5.4 5.8 5.3  HGB 11.7* 11.3* 11.6* 11.2*  HCT 39.1 36.9 38.4 36.5  PLT 132* 125* 123* 132*    COAGS: No results for input(s): "INR", "APTT" in the last 8760 hours.  BMP: Recent Labs    11/26/23 0258 12/30/23 0414 12/30/23 2008 12/31/23 0628 01/01/24 0920  NA 140 140  --  138 139  K 4.6 4.3  --  4.0 4.3  CL 96* 97*  --  99 100  CO2 29 24  --  26 25  GLUCOSE 99 88  --  82 122*  BUN 20 42*  --  27* 40*  CALCIUM  9.5 7.7*  --  8.5* 8.0*  CREATININE 4.10* 6.41* 3.13* 4.60* 6.45*  GFRNONAA 14* 8* 20* 12* 8*    LIVER FUNCTION TESTS: Recent Labs    07/16/23 0439 07/23/23 2000 07/24/23 0900 12/30/23 0414 01/01/24 0920  BILITOT 1.2* 1.1 1.5* 1.2  --   AST 28 28 38 26  --   ALT 17 18 20 16   --   ALKPHOS 139* 135* 124 114  --   PROT 8.2* 7.9 7.6 6.8  --   ALBUMIN 3.5 3.7 3.3* 3.0* 2.8*    TUMOR MARKERS: No results for input(s): "AFPTM", "CEA", "CA199", "CHROMGRNA" in the last 8760 hours.  Assessment and Plan:  Per Dr. Cari Char attestation of Mr. Minor's consult note on 6/5: "Chronic right pleural effusion:  Enlarging over time.  Appears loculated given anterior nature.  Possibly slowly accumulated over time in the setting of need for ESRD and difficulty with volume removal without normal kidney function.  Unclear why this is unilateral however.  Needs to be sampled in my opinion for diagnostic purposes as well as see if there is therapeutic improvement. -- Recommend IR consult for image guided chest tube vs thoracentesis given appearance and location of effusion -- If amenable would send cytology, cell count, protein, LDH, bacterial culture and Gram stain"  Patient presents for scheduled right-sided chest tube placement  in IR today.  Patient has been NPO since 0915 this morning. NPO ordered and pt instructed to have no additional oral intake until after the procedure. All labs and medications are within acceptable parameters.  No pertinent allergies.   Risks and benefits of chest tube placement were discussed with the patient including bleeding, infection, damage to adjacent structures, malfunction of the tube requiring additional procedures and sepsis.  All of the patient's questions were answered, patient is agreeable to proceed. Consent signed and in chart.      Thank you for allowing our service to participate in SHANAYAH KAFFENBERGER 's care.  Electronically Signed: Nicolasa Barrett, PA-C   01/01/2024, 10:54 AM    I spent a total of 40 Minutes in face to face in clinical consultation, greater than 50% of which was counseling/coordinating care for recurrent right-sided pleural effusion with possible loculations, with consideration for chest tube placement.

## 2024-01-02 ENCOUNTER — Inpatient Hospital Stay (HOSPITAL_COMMUNITY)

## 2024-01-02 DIAGNOSIS — N186 End stage renal disease: Secondary | ICD-10-CM | POA: Diagnosis not present

## 2024-01-02 DIAGNOSIS — Z938 Other artificial opening status: Secondary | ICD-10-CM | POA: Diagnosis not present

## 2024-01-02 DIAGNOSIS — I10 Essential (primary) hypertension: Secondary | ICD-10-CM | POA: Diagnosis not present

## 2024-01-02 DIAGNOSIS — J90 Pleural effusion, not elsewhere classified: Secondary | ICD-10-CM | POA: Diagnosis not present

## 2024-01-02 DIAGNOSIS — Z993 Dependence on wheelchair: Secondary | ICD-10-CM

## 2024-01-02 LAB — LACTATE DEHYDROGENASE: LDH: 242 U/L — ABNORMAL HIGH (ref 98–192)

## 2024-01-02 MED ORDER — MIDODRINE HCL 5 MG PO TABS
5.0000 mg | ORAL_TABLET | Freq: Two times a day (BID) | ORAL | Status: DC
  Administered 2024-01-02 – 2024-01-04 (×3): 5 mg via ORAL
  Filled 2024-01-02 (×3): qty 1

## 2024-01-02 MED ORDER — METHOCARBAMOL 500 MG PO TABS
1000.0000 mg | ORAL_TABLET | Freq: Once | ORAL | Status: AC
  Administered 2024-01-02: 1000 mg via ORAL
  Filled 2024-01-02: qty 2

## 2024-01-02 MED ORDER — MIDODRINE HCL 5 MG PO TABS
5.0000 mg | ORAL_TABLET | ORAL | Status: AC
  Administered 2024-01-02: 5 mg via ORAL
  Filled 2024-01-02: qty 1

## 2024-01-02 MED ORDER — METHOCARBAMOL 1000 MG/10ML IJ SOLN: 1000.0000 mg | Freq: Once | INTRAMUSCULAR | Status: DC

## 2024-01-02 MED ORDER — PROCHLORPERAZINE EDISYLATE 10 MG/2ML IJ SOLN
5.0000 mg | Freq: Once | INTRAMUSCULAR | Status: AC
  Administered 2024-01-02: 5 mg via INTRAVENOUS
  Filled 2024-01-02: qty 2

## 2024-01-02 MED ORDER — SODIUM CHLORIDE 0.9 % IV BOLUS
250.0000 mL | Freq: Once | INTRAVENOUS | Status: AC
  Administered 2024-01-02: 250 mL via INTRAVENOUS

## 2024-01-02 NOTE — Progress Notes (Signed)
 Chest CT shows complete drainage of the effusion.  But there is a small pneumothorax and some reexpansion edema.  I thought I will clamp the chest tube and follow in the night to a chest x-ray but when I spoke to nursing they said patient is hypotensive with systolics in the 80s.  Therefore I canceled my plan and instructed the nurse to call primary hospitalist.      SIGNATURE    Dr. Maire Scot, M.D., F.C.C.P,  Pulmonary and Critical Care Medicine Staff Physician, [facility] Director - Interstitial Lung Disease  Program  Pulmonary Fibrosis [facility] at [facility] [place], [place], [address]   Pager: [phone], If no answer  -> Check AMION or Try [phone] Telephone (clinical office): [phone] Telephone (research): [phone]  5:18 PM 01/02/2024

## 2024-01-02 NOTE — Progress Notes (Signed)
     Vitals:   01/02/24 0456 01/02/24 0825 01/02/24 1653 01/02/24 1932  BP: 98/61 102/63 (!) 85/52 104/68  Pulse: (!) 110 84 77 78  Temp: 97.7 F (36.5 C) 97.6 F (36.4 C) (!) 97.5 F (36.4 C) 98 F (36.7 C)  Resp: 19 19 19 16   Height:      Weight: 35.9 kg     SpO2: 97% 100% 95% 94%  TempSrc:    Oral  BMI (Calculated): 40.63      D/w RN says - chest tube is actually clamped  Plan  - get cxr  0 will ask NF To follow    SIGNATURE    Dr. Maire Scot, M.D., F.C.C.P,  Pulmonary and Critical Care Medicine Staff Physician, [facility] Center Director - Interstitial Lung Disease  Program  Pulmonary Fibrosis [facility] at [facility] [place], [place], [address]   Pager: [phone], If no answer  -> Check AMION or Try [phone] Telephone (clinical office): [phone] Telephone (research): [phone]  9:53 PM 01/02/2024

## 2024-01-02 NOTE — Consult Note (Signed)
 NAME:  [name], MRN:  [id number], DOB:  May 15, 1991, LOS: 2 ADMISSION DATE:  12/30/2023, CONSULTATION DATE: 12/31/2023 REFERRING MD: [facility], CHIEF COMPLAINT: Shortness of breath  History of Present Illness:  33 year old female who has a extremely complicated past medical history that entails but is not limited to spina bifida, obstructive sleep apnea, wheelchair dependent, end-stage renal disease, who presents with chief complaints of shortness of breath.  She has a right loculated pleural effusion that would be more amenable to interventional radiology drainage down from pulmonary.  May give consideration to continuing to obtain the drier weight she has no fevers chills sweats or other indication of infection.  Pertinent  Medical History    has a past medical history of Acute asthma exacerbation (07/15/2023), Anemia associated with chronic renal failure, Blood transfusion, Caudal regression syndrome, Depression (05/14/2015), Dialysis care, ESRD (end stage renal disease) on dialysis (HCC), GERD (gastroesophageal reflux disease) (01/07/2017), HTN (hypertension) (05/02/2011), Murmur, cardiac (07/17/2022), OSA (obstructive sleep apnea) (10/26/2023), Pneumonia, unspecified organism (07/27/2023), Spina bifida, UTI (lower urinary tract infection), and Viral URI with cough (09/15/2022).   has a past surgical history that includes AV fistula placement; Insertion of dialysis catheter (N/A, 05/08/2020); IR Fluoro Guide CV Line Right (05/09/2020); IR US  Guide Vasc Access Right (05/09/2020); A/V Fistulagram (Left, 10/14/2021); RIGHT HEART CATH (N/A, 06/30/2023); and IR PERC PLEURAL DRAIN W/INDWELL CATH W/IMG GUIDE (01/01/2024).   Significant Hospital Events: Including procedures, antibiotic start and stop dates in addition to other pertinent events   6/6 - IR gudided chest tube  Interim History / Subjective:    6/7 - yesterday chest gube and drained. Today some drainage only but improved dyspnea but  feeling pleuritic pain. Huband nelson at bedside. No other complaints  Objective    Blood pressure 102/63, pulse 84, temperature 97.6 F (36.4 C), resp. rate 19, height 3\' 1"  (0.94 m), weight 35.9 kg, SpO2 100%.        Intake/Output Summary (Last 24 hours) at 01/02/2024 1316 Last data filed at 01/01/2024 1500 Gross per 24 hour  Intake 0 ml  Output 0 ml  Net 0 ml   Filed Weights   01/01/24 0854 01/01/24 1300 01/02/24 0456  Weight: 35.8 kg 34.4 kg 35.9 kg    Examination:  General Appearance:  SPINA BIFIDA. SHort. No disress Head:  Normocephalic, without obvious abnormality, atraumatic Eyes:  PERRL - yes, conjunctiva/corneas - muddy     Ears:  Normal external ear canals, both ears Nose:  G tube - no Throat:  ETT TUBE - no , OG tube - no Neck:  Supple,  No enlargement/tenderness/nodules Lungs: Clear to auscultation bilaterally,  Heart:  S1 and S2 normal, no murmur,  Neuro  A x Xo3   Resolved problem list   Assessment and Plan  Right loculated v non-locuaed effusion in the setting of end-stage renal disease without complaint of fever, chills, sweats or purulent sputum.  This is an anterior component to her left leg most likely require an anterior approach for thoracentesis.   6/7 cxr post chest tube suggests near total drainage of Rt pleural effusion  PLAN  - get ct chest without contrast  - get pleural fluid analysis opf fluid from drain - based on results - can cansider dc chest tube 01/03/24     End-stage renal disease with a fistula left bicep For the renal  Spina bifida Wheelchair-bound    Best Practice (right click and "Reselect all SmartList Selections" daily)   Per [facility]ad  Dw Dr Mliss Anderson    Dr. Maire Scot, M.D., F.C.C.P,  Pulmonary and Critical Care Medicine Staff Physician, [facility] Center Director - Interstitial Lung Disease  Program  Pulmonary Fibrosis [facility] at [facility] [place],  [place], [address]   Pager: [phone], If no answer  -> Check AMION or Try [phone] Telephone (clinical office): [phone] Telephone (research): [phone]  1:16 PM 01/02/2024    LABS    PULMONARY No results for input(s): "PHART", "PCO2ART", "PO2ART", "HCO3", "TCO2", "O2SAT" in the last 168 hours.  Invalid input(s): "PCO2", "PO2"  CBC Recent Labs  Lab 12/30/23 2008 12/31/23 0628 01/01/24 0900  HGB 11.3* 11.6* 11.2*  HCT 36.9 38.4 36.5  WBC 5.4 5.8 5.3  PLT 125* 123* 132*    COAGULATION No results for input(s): "INR" in the last 168 hours.  CARDIAC  No results for input(s): "TROPONINI" in the last 168 hours. No results for input(s): "PROBNP" in the last 168 hours.   CHEMISTRY Recent Labs  Lab 12/30/23 0414 12/30/23 2008 12/31/23 0628 01/01/24 0920  NA 140  --  138 139  K 4.3  --  4.0 4.3  CL 97*  --  99 100  CO2 24  --  26 25  GLUCOSE 88  --  82 122*  BUN 42*  --  27* 40*  CREATININE 6.41* 3.13* 4.60* 6.45*  CALCIUM  7.7*  --  8.5* 8.0*  PHOS  --   --   --  6.5*   Estimated Creatinine Clearance: 2 mL/min (A) (by C-G formula based on SCr of 6.45 mg/dL (H)).   LIVER Recent Labs  Lab 12/30/23 0414 01/01/24 0920  AST 26  --   ALT 16  --   ALKPHOS 114  --   BILITOT 1.2  --   PROT 6.8  --   ALBUMIN 3.0* 2.8*     INFECTIOUS Recent Labs  Lab 12/30/23 2008  PROCALCITON 0.96     ENDOCRINE CBG (last 3)  Recent Labs    12/30/23 1751 01/01/24 0057  GLUCAP 75 111*         IMAGING x48h  - image(s) personally visualized  -   highlighted in bold DG CHEST PORT 1 VIEW Result Date: 01/02/2024 CLINICAL DATA:  Pleural effusion. EXAM: PORTABLE CHEST 1 VIEW COMPARISON:  Chest radiograph dated 12/30/2023. FINDINGS: Right-sided chest tube with tip over the right upper lung. Significant decrease with near complete resolution of right pleural effusion. There is re-expansion of the right lung. No pneumothorax. Mild cardiomegaly. No acute osseous  pathology. IMPRESSION: Right-sided chest tube with near complete resolution of right pleural effusion. Electronically Signed   By: Angus Bark M.D.   On: 01/02/2024 10:05   IR PERC PLEURAL DRAIN W/INDWELL CATH W/IMG GUIDE Result Date: 01/01/2024 INDICATION: 33 year old female with complicated right anterior pleural effusion. She presents for placement of a pigtail chest tube. EXAM: Ultrasound and fluoroscopy guided pigtail chest tube placement MEDICATIONS: The patient is currently admitted to the hospital and receiving intravenous antibiotics. The antibiotics were administered within an appropriate time frame prior to the initiation of the procedure. ANESTHESIA/SEDATION: None. COMPLICATIONS: None immediate. PROCEDURE: Informed written consent was obtained from the patient after a thorough discussion of the procedural risks, benefits and alternatives. All questions were addressed. Maximal Sterile Barrier Technique was utilized including caps, mask, sterile gowns, sterile gloves, sterile drape, hand hygiene and skin antiseptic. A timeout was performed prior to the  initiation of the procedure. The right chest was interrogated with ultrasound. The complex parole effusion is identified. A suitable skin entry site was selected and marked. Local anesthesia was attained by infiltration with 1% lidocaine . A small dermatotomy was made. Under real-time ultrasound guidance, an 18 gauge trocar needle was carefully advanced over a rib and into the pleural space. A 0.035 wire was advanced into the pleural space. The needle was removed. The tract was dilated to 10 [place]. A skater 10 [place] all-purpose drainage catheter was advanced over the wire and formed. Aspiration was then performed yielding 600 mL of bloody pleural fluid. The catheter was then connected to an atrium and secured to the skin with 0 Prolene suture. Bandages were applied. IMPRESSION: Successful placement of a 10 French percutaneous pigtail chest tube into  the complex right pleural effusion. Successful evacuation of 600 mL bloody pleural fluid. A sample was sent for culture. Electronically Signed   By: Fernando Hoyer M.D.   On: 01/01/2024 16:21

## 2024-01-02 NOTE — Progress Notes (Signed)
 PROGRESS NOTE    [name]  ZOX:[id number] DOB: 06/03/1991 DOA: 12/30/2023 PCP: [facility]  Subjective: Pt seen and examined.  Has another 400 ml from chest tube since last night.  Total output thus far has been about 1.2 liters(600 ml from initial chest tube insertion) and the 800 ml in her chest atrium thus far.  Today's CXR shows lots of improvement in pleural effusion since chest tube placement. Awaiting for PCCM to decide if pt needs intra-pleural lytics today.  Pt complaining of severe muscle cramping. She states she gets this when too much fluid is removed with HD.    Hospital Course: HPI:  [name] is a 33 y.o. female with medical history significant of spina bifida, HTN, ESRD (HD on MWF), and depression p/w SOB iso R pleural effusion.   Pt was in her USOH until this morning at 0300 when she experienced acute onset SOB while sitting up watching television for which pt activated EMS. Pt reports having her last HD session on Monday. Denies any sick contacts or recent medications changes.   In the ED, pt tachycardic and tachypneic on RA. Labs notable for BUN/Cr 42/6.41, BNP 1039, troponin 79-->78, and Hb 11.7.  CTA neg for PE, but did show large R pleural effusion with RLL and RML collapse. Pt admitted to medicine w/ Nephrology following.    Significant Events: Admitted 12/30/2023 for recurrent right pleural effusion   Admission Labs: WBC 7.1, HgB 11.7, plt 132 Na 140, K 4.3, CO2 of 24, BUN 42, scr 6.41, glu 88 TP 6.8, alb 3.0, AST 26, ALT 16, T. Bili 1.2 BNP 1039  Admission Imaging Studies: CXR Progressive right-sided airspace disease and pleural opacification. Most likely represents a loculated pleural effusion Airspace disease is concerning for pneumonia or post-obstructive disease. 2. Stable cardiac enlargement CTPA No pulmonary embolism. 2. Large right pleural effusion with associated right lower lobe collapse and partial right middle lobe  collapse. 3. Diffuse ground-glass attenuation within the aerated lungs, no mass lesion. This likely reflects edema  Significant Labs:   Significant Imaging Studies:   Antibiotic Therapy: Anti-infectives (From admission, onward)    None       Procedures: 01-01-2024 right pigtail chest tube by IR  Consultants: nephrology    Assessment and Plan: * Recurrent pleural effusion on right 12-31-2023 PCCM consulted. Per PCCM notes, they will ask IR for thoracentesis. Pt states someone told her that she is not getting thoracentesis. She isn't sure who said that. PCCM recommended extra fluid removal with HD. Nephrology made aware of PCCM recs.  01-01-2024 s/p chest tube placement. Defer to PCCM if she needs intra-pleural lytics. IV dilaudid  ordered x 1 for pain. Po dilaudid  prn q4h prn.  01-02-2024 Has another 400 ml from chest tube since last night.  Total output thus far has been about 1.2 liters(600 ml from initial chest tube insertion) and the 800 ml in her chest atrium thus far. Today's CXR shows lots of improvement in pleural effusion since chest tube placement. Awaiting for PCCM to decide if pt needs intra-pleural lytics today.  S/P chest tube placement [facility]) - 01-01-2024 01-01-2024 placed due to loculated pleural effusion. Management per PCCM.  01-02-2024 Has another 400 ml from chest tube since last night.  Total output thus far has been about 1.2 liters(600 ml from initial chest tube insertion) and the 800 ml in her chest atrium thus far. Today's CXR shows lots of improvement in pleural effusion since chest tube placement. Awaiting for PCCM  to decide if pt needs intra-pleural lytics today.  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 Wheelchair dependent.  01-01-2024 chronic.  01-02-2024 stable  Spina bifida (HCC) 12-31-2023 chronic.  01-01-2024 chronic.  01-02-2024 stable  Essential hypertension 12-31-2023 on Toprol -XL 100 mg daily.  01-01-2024 stable.  01-02-2024  stable  End stage renal disease on dialysis [facility]) 12-31-2023 nephrology consulted for routine HD.  01-01-2024 had HD today(M W F schedule)  01-02-2024 stable. Will give po robaxin  due to muscle cramping.  Pt complaining of severe muscle cramping. She states she gets this when too much fluid is removed with HD    DVT prophylaxis: heparin  injection 5,000 Units Start: 01/02/24 0600 Place and maintain sequential compression device Start: 12/31/23 0027    Code Status: Full Code Family Communication: pt's husband sleeping in recliner at her bedside Disposition Plan: return home Reason for continuing need for hospitalization: has chest tube to water seal. Awaiting PCCM f/u note.  Objective: Vitals:   01/01/24 2315 01/01/24 2346 01/02/24 0456 01/02/24 0825  BP:  102/67 98/61 102/63  Pulse: 89 86 (!) 110 84  Resp:   19 19  Temp:   97.7 F (36.5 C) 97.6 F (36.4 C)  TempSrc:      SpO2: 95% 95% 97% 100%  Weight:   35.9 kg   Height:        Intake/Output Summary (Last 24 hours) at 01/02/2024 1010 Last data filed at 01/01/2024 1500 Gross per 24 hour  Intake 0 ml  Output 1400 ml  Net -1400 ml   Filed Weights   01/01/24 0854 01/01/24 1300 01/02/24 0456  Weight: 35.8 kg 34.4 kg 35.9 kg    Examination:  Physical Exam Vitals and nursing note reviewed.  Constitutional:      General: She is not in acute distress.    Appearance: She is not toxic-appearing or diaphoretic.  Cardiovascular:     Rate and Rhythm: Normal rate and regular rhythm.  Pulmonary:     Effort: Pulmonary effort is normal.     Breath sounds: Normal breath sounds.  Abdominal:     General: Bowel sounds are normal.     Palpations: Abdomen is soft.  Musculoskeletal:     Comments: Extremely atrophic bilateral lower legs. Very shortened tib/fib/ankles/feet. Chronic. Due to hx of spina bifida.  Skin:    General: Skin is warm.     Capillary Refill: Capillary refill takes less than 2 seconds.  Neurological:     Mental  Status: She is alert and oriented to person, place, and time.    Data Reviewed: I have personally reviewed following labs and imaging studies  CBC: Recent Labs  Lab 12/30/23 0414 12/30/23 2008 12/31/23 0628 01/01/24 0900  WBC 7.1 5.4 5.8 5.3  HGB 11.7* 11.3* 11.6* 11.2*  HCT 39.1 36.9 38.4 36.5  MCV 100.8* 98.7 101.3* 101.1*  PLT 132* 125* 123* 132*   Basic Metabolic Panel: Recent Labs  Lab 12/30/23 0414 12/30/23 2008 12/31/23 0628 01/01/24 0920  NA 140  --  138 139  K 4.3  --  4.0 4.3  CL 97*  --  99 100  CO2 24  --  26 25  GLUCOSE 88  --  82 122*  BUN 42*  --  27* 40*  CREATININE 6.41* 3.13* 4.60* 6.45*  CALCIUM  7.7*  --  8.5* 8.0*  PHOS  --   --   --  6.5*   GFR: Estimated Creatinine Clearance: 2 mL/min (A) (by C-G formula based on SCr of 6.45  mg/dL (H)). Liver Function Tests: Recent Labs  Lab 12/30/23 0414 01/01/24 0920  AST 26  --   ALT 16  --   ALKPHOS 114  --   BILITOT 1.2  --   PROT 6.8  --   ALBUMIN 3.0* 2.8*   BNP (last 3 results) Recent Labs    07/15/23 0405 07/23/23 2000 12/30/23 0414  BNP 1,979.9* 2,478.9* 1,039.6*   CBG: Recent Labs  Lab 12/30/23 1751 01/01/24 0057  GLUCAP 75 111*   Sepsis Labs: Recent Labs  Lab 12/30/23 2008  PROCALCITON 0.96    Recent Results (from the past 240 hours)  MRSA Next Gen by PCR, Nasal     Status: None   Collection Time: 12/30/23  8:53 PM   Specimen: Nasal Mucosa; Nasal Swab  Result Value Ref Range Status   MRSA by PCR Next Gen NOT DETECTED NOT DETECTED Final    Comment: (NOTE) The GeneXpert MRSA Assay (FDA approved for NASAL specimens only), is one component of a comprehensive MRSA colonization surveillance program. It is not intended to diagnose MRSA infection nor to guide or monitor treatment for MRSA infections. Test performance is not FDA approved in patients less than 70 years old. Performed at [facility], [address] [place], [place] [address]   Body fluid culture w  Gram Stain     Status: None (Preliminary result)   Collection Time: 01/01/24  4:17 PM   Specimen: Pleura  Result Value Ref Range Status   Specimen Description PLEURAL  Final   Special Requests PLEURAL  Final   Gram Stain   Final    NO WBC SEEN NO ORGANISMS SEEN Performed at [facility], [address], [place], [place] [address]    Culture PENDING  Incomplete   Report Status PENDING  Incomplete     Radiology Studies: DG CHEST PORT 1 VIEW Result Date: 01/02/2024 CLINICAL DATA:  Pleural effusion. EXAM: PORTABLE CHEST 1 VIEW COMPARISON:  Chest radiograph dated 12/30/2023. FINDINGS: Right-sided chest tube with tip over the right upper lung. Significant decrease with near complete resolution of right pleural effusion. There is re-expansion of the right lung. No pneumothorax. Mild cardiomegaly. No acute osseous pathology. IMPRESSION: Right-sided chest tube with near complete resolution of right pleural effusion. Electronically Signed   By: Angus Bark M.D.   On: 01/02/2024 10:05   IR PERC PLEURAL DRAIN W/INDWELL CATH W/IMG GUIDE Result Date: 01/01/2024 INDICATION: 33 year old female with complicated right anterior pleural effusion. She presents for placement of a pigtail chest tube. EXAM: Ultrasound and fluoroscopy guided pigtail chest tube placement MEDICATIONS: The patient is currently admitted to the hospital and receiving intravenous antibiotics. The antibiotics were administered within an appropriate time frame prior to the initiation of the procedure. ANESTHESIA/SEDATION: None. COMPLICATIONS: None immediate. PROCEDURE: Informed written consent was obtained from the patient after a thorough discussion of the procedural risks, benefits and alternatives. All questions were addressed. Maximal Sterile Barrier Technique was utilized including caps, mask, sterile gowns, sterile gloves, sterile drape, hand hygiene and skin antiseptic. A timeout was performed prior to the initiation of the  procedure. The right chest was interrogated with ultrasound. The complex parole effusion is identified. A suitable skin entry site was selected and marked. Local anesthesia was attained by infiltration with 1% lidocaine . A small dermatotomy was made. Under real-time ultrasound guidance, an 18 gauge trocar needle was carefully advanced over a rib and into the pleural space. A 0.035 wire was advanced into the pleural space.  The needle was removed. The tract was dilated to 10 [place]. A skater 10 [place] all-purpose drainage catheter was advanced over the wire and formed. Aspiration was then performed yielding 600 mL of bloody pleural fluid. The catheter was then connected to an atrium and secured to the skin with 0 Prolene suture. Bandages were applied. IMPRESSION: Successful placement of a 10 French percutaneous pigtail chest tube into the complex right pleural effusion. Successful evacuation of 600 mL bloody pleural fluid. A sample was sent for culture. Electronically Signed   By: Fernando Hoyer M.D.   On: 01/01/2024 16:21    Scheduled Meds:  Chlorhexidine  Gluconate Cloth  6 each Topical Q0600   ferric citrate   420 mg Oral BID WC   heparin   5,000 Units Subcutaneous Q8H   methocarbamol   1,000 mg Oral Once   metoprolol  succinate  100 mg Oral Daily   multivitamin  1 tablet Oral Daily   pantoprazole   40 mg Oral Daily   sodium zirconium cyclosilicate   10 g Oral Q M,W,F   Continuous Infusions:   LOS: 2 days   Time spent: 50 minutes  Unk Garb, DO  [facility]  01/02/2024, 10:10 AM

## 2024-01-02 NOTE — Progress Notes (Addendum)
 Patient ID: [name], female   DOB: Aug 30, 1990, 33 y.o.   MRN: [id number]    Referring Physician(s): Dr. Orbie Binder, MD   Supervising Physician: Creasie Doctor  Patient Status:  [facility] - In-pt  Chief Complaint:  Loculated right pleural effusion, s/p right chest tube placement 01/01/24 with Dr. Marne Sings  Subjective:  Day 1 post right chest tube placement. 600 mL drained after initial placement with about 600 mL additional in the atrium at bedside today. Pt feeling ok, some mild soreness around tube site. Has had some intermittent muscle spasms, says her medicine team has ordered muscle relaxer's but has not gotten them yet.  Allergies: Ciprofloxacin ; Other; Peanut -containing drug products; Aleve [naproxen sodium]; Ceftriaxone ; Coconut (cocos nucifera); Influenza vaccines; Tetanus toxoid, adsorbed; Tetanus toxoids; and Latex  Medications: Prior to Admission medications   Medication Sig Start Date End Date Taking? Authorizing Provider  albuterol  (PROVENTIL ) (2.5 MG/3ML) 0.083% nebulizer solution Take 3 mLs (2.5 mg total) by nebulization every 6 (six) hours as needed for wheezing or shortness of breath. 07/28/23  Yes Austine Lefort, MD  albuterol  (VENTOLIN  HFA) [phone] Base) MCG/ACT inhaler Inhale 1-2 puffs into the lungs every 6 (six) hours as needed for wheezing or shortness of breath. Patient taking differently: Inhale 2 puffs into the lungs every 6 (six) hours as needed for wheezing or shortness of breath. 09/15/22  Yes Jenelle Mis, FNP  AURYXIA  1 GM 210 MG(Fe) tablet Take 420 mg by mouth 2 (two) times daily with a meal. 04/15/18  Yes [provider]  B Complex-C-Folic Acid  (DIALYVITE  800) 0.8 MG TABS Take 1 tablet by mouth daily. 12/07/23  Yes [provider]  EPINEPHrine  0.3 mg/0.3 mL IJ SOAJ injection Inject 0.3 mg into the muscle as needed for anaphylaxis. 04/23/23  Yes Jenelle Mis, FNP  metoprolol  succinate (TOPROL -XL) 100 MG 24 hr tablet Take 100  mg by mouth daily. 12/29/19  Yes [provider]  omeprazole (PRILOSEC) 40 MG capsule Take 40 mg by mouth in the morning and at bedtime. 01/08/22  Yes [provider]  ondansetron  (ZOFRAN ) 4 MG tablet Take 4 mg by mouth every 8 (eight) hours as needed for nausea or vomiting. 12/09/23  Yes [provider]  sodium zirconium cyclosilicate  (LOKELMA ) 5 g packet Take 10 g by mouth every Monday, Wednesday, and Friday. 07/20/21  Yes [provider]  meclizine  (ANTIVERT ) 12.5 MG tablet Take 1 tablet (12.5 mg total) by mouth 2 (two) times daily as needed for dizziness. Patient not taking: Reported on 12/30/2023 11/26/23   Alissa April, MD     Vital Signs: BP 102/63 (BP Location: Right Arm)   Pulse 84   Temp 97.6 F (36.4 C)   Resp 19   Ht 3\' 1"  (0.94 m)   Wt 79 lb 2.3 oz (35.9 kg)   SpO2 100%   BMI 40.65 kg/m    Physical Exam Vitals and nursing note reviewed.  Constitutional:      Appearance: Normal appearance.  HENT:     Mouth/Throat:     Mouth: Mucous membranes are moist.     Pharynx: Oropharynx is clear.  Cardiovascular:     Rate and Rhythm: Normal rate.  Pulmonary:     Effort: Pulmonary effort is normal.     Breath sounds: Normal breath sounds.  + R chest tube. No abnormality. No air leak. 600 ml blood tinged fluid in bedside fluid atrium. Abdominal:     Palpations: Abdomen is soft.  Tenderness: There is no abdominal tenderness.  Musculoskeletal:     Comments: Extremely atrophic bilateral lower legs. Very shortened tib/fib/ankles/feet. Chronic. 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  Neurological:     Mental Status: She is alert and oriented to person, place, and time. Mental status is at baseline   Imaging: IR PERC PLEURAL DRAIN W/INDWELL CATH W/IMG GUIDE Result Date: 01/01/2024 INDICATION: 33 year old female with complicated right anterior pleural effusion. She presents for placement of a  pigtail chest tube. EXAM: Ultrasound and fluoroscopy guided pigtail chest tube placement MEDICATIONS: The patient is currently admitted to the hospital and receiving intravenous antibiotics. The antibiotics were administered within an appropriate time frame prior to the initiation of the procedure. ANESTHESIA/SEDATION: None. COMPLICATIONS: None immediate. PROCEDURE: Informed written consent was obtained from the patient after a thorough discussion of the procedural risks, benefits and alternatives. All questions were addressed. Maximal Sterile Barrier Technique was utilized including caps, mask, sterile gowns, sterile gloves, sterile drape, hand hygiene and skin antiseptic. A timeout was performed prior to the initiation of the procedure. The right chest was interrogated with ultrasound. The complex parole effusion is identified. A suitable skin entry site was selected and marked. Local anesthesia was attained by infiltration with 1% lidocaine . A small dermatotomy was made. Under real-time ultrasound guidance, an 18 gauge trocar needle was carefully advanced over a rib and into the pleural space. A 0.035 wire was advanced into the pleural space. The needle was removed. The tract was dilated to 10 [place]. A skater 10 [place] all-purpose drainage catheter was advanced over the wire and formed. Aspiration was then performed yielding 600 mL of bloody pleural fluid. The catheter was then connected to an atrium and secured to the skin with 0 Prolene suture. Bandages were applied. IMPRESSION: Successful placement of a 10 French percutaneous pigtail chest tube into the complex right pleural effusion. Successful evacuation of 600 mL bloody pleural fluid. A sample was sent for culture. Electronically Signed   By: Fernando Hoyer M.D.   On: 01/01/2024 16:21   CT Angio Chest PE W and/or Wo Contrast Result Date: 12/30/2023 EXAM: CTA of the Chest with contrast for PE 12/30/2023 07:24:38 AM TECHNIQUE: CTA of the chest was  performed after the administration of intravenous contrast. Multiplanar reformatted images are provided for review. MIP images are provided for review. Automated exposure control, iterative reconstruction, and/or weight based adjustment of the mA/kV was utilized to reduce the radiation dose to as low as reasonably achievable. COMPARISON: CT angio chest 03/31/2020 CLINICAL HISTORY: Pulmonary embolism (PE) suspected, high prob. female with history of spina bifida, CHF, pulmonary hypertension, mitral stenosis, bilateral AKAs, ESRD on hemodialysis Monday Wednesday Friday. She presents to the ER today for evaluation for shortness of breath that started several hours ago. She states she feels like she has too much fluid and needs dialysis early. FINDINGS: PULMONARY ARTERIES: Pulmonary arteries are adequately opacified for evaluation. No pulmonary embolism. Main pulmonary artery is normal in caliber. MEDIASTINUM: Heart is mildly enlarged. There is no acute abnormality of the thoracic aorta. LYMPH NODES: No mediastinal, hilar or axillary lymphadenopathy. LUNGS AND PLEURA: A large right pleural effusion is present. The right lower lobe is mostly collapsed. Partial collapse of the right middle lobe is present. Diffuse ground-glass attenuation is present within the aerated lungs. No mass lesion is present. UPPER ABDOMEN: Abdominal ascites is again noted. Heterogeneous appearance of the pancreas  is stable. SOFT TISSUES AND BONES: Caudal regression again noted. No acute bone or soft tissue abnormality. IMPRESSION: 1. No pulmonary embolism. 2. Large right pleural effusion with associated right lower lobe collapse and partial right middle lobe collapse. 3. Diffuse ground-glass attenuation within the aerated lungs, no mass lesion. This likely reflects edema. Electronically signed by: Audree Leas MD 12/30/2023 07:33 AM EDT RP Workstation: [id number]   DG Chest Port 1 View Result Date: 12/30/2023 EXAM: 1 VIEW XRAY OF THE  CHEST 12/30/2023 04:30:00 AM COMPARISON: 1 view chest x-ray 11/26/2023. CLINICAL HISTORY: SOB. Pt BIB [facility] from home. Pt had sudden onset of SOB 2 hrs ago. Originally 88% on RA, 4L [place] applied to pt and O2 raised to 96%. Denies CP; Pt is MWF dialysis, pt reports receiving full treatment on Monday. FINDINGS: LUNGS AND PLEURA: Progressive right-sided airspace disease and pleural opacification is present. The left hemithorax is clear. HEART AND MEDIASTINUM: Cardiac enlargement is stable. BONES AND SOFT TISSUES: No acute osseous abnormality. IMPRESSION: 1. Progressive right-sided airspace disease and pleural opacification. Most likely represents a loculated pleural effusion Airspace disease is concerning for pneumonia or post-obstructive disease. 2. Stable cardiac enlargement. Electronically signed by: Audree Leas MD 12/30/2023 04:47 AM EDT RP Workstation: [id number]    Labs:  CBC: Recent Labs    12/30/23 0414 12/30/23 2008 12/31/23 1027 01/01/24 0900  WBC 7.1 5.4 5.8 5.3  HGB 11.7* 11.3* 11.6* 11.2*  HCT 39.1 36.9 38.4 36.5  PLT 132* 125* 123* 132*    COAGS: No results for input(s): "INR", "APTT" in the last 8760 hours.  BMP: Recent Labs    11/26/23 0258 12/30/23 0414 12/30/23 2008 12/31/23 0628 01/01/24 0920  NA 140 140  --  138 139  K 4.6 4.3  --  4.0 4.3  CL 96* 97*  --  99 100  CO2 29 24  --  26 25  GLUCOSE 99 88  --  82 122*  BUN 20 42*  --  27* 40*  CALCIUM  9.5 7.7*  --  8.5* 8.0*  CREATININE 4.10* 6.41* 3.13* 4.60* 6.45*  GFRNONAA 14* 8* 20* 12* 8*    LIVER FUNCTION TESTS: Recent Labs    07/16/23 0439 07/23/23 2000 07/24/23 0900 12/30/23 0414 01/01/24 0920  BILITOT 1.2* 1.1 1.5* 1.2  --   AST 28 28 38 26  --   ALT 17 18 20 16   --   ALKPHOS 139* 135* 124 114  --   PROT 8.2* 7.9 7.6 6.8  --   ALBUMIN 3.5 3.7 3.3* 3.0* 2.8*    Assessment and Plan:  Day 1 post right chest tube placement - tube appears well, no abnormality, no air leak. 1200 mL total  drained since placement  (600 mL initial and 600 mL in fluid atrium at bedside) - Repeat cxr today appears much improved from prior, no official read yet. - pt feeling less SOB, but mild chest tube insertion site soreness and intermittent muscle spasms. Will reach out to hospitalist to consider rechecking electrolytes. Pt also with muscle relaxer ordered she has not gotten. - IR will continue to follow chest tube. Please call with any new questions or concerns.  Electronically Signed: Nicolasa Barrett, PA-C 01/02/2024, 9:57 AM   I spent a total of 15 Minutes at the the patient's bedside AND on the patient's hospital floor or unit, greater than 50% of which was counseling/coordinating care for right chest tube follow up

## 2024-01-02 NOTE — Progress Notes (Signed)
 [facility] Progress Note  Subjective:  Seen in room Had chest tube put in yesterday Had HD yest w/ 1.4 L off, cramping a lot yest and today   Vitals:   01/01/24 2315 01/01/24 2346 01/02/24 0456 01/02/24 0825  BP:  102/67 98/61 102/63  Pulse: 89 86 (!) 110 84  Resp:   19 19  Temp:   97.7 F (36.5 C) 97.6 F (36.4 C)  TempSrc:      SpO2: 95% 95% 97% 100%  Weight:   35.9 kg   Height:        Exam: Gen alert, no distress, on RA R chest tube in place No jvd or bruits Chest clear bilat to bases RRR no MRG Abd soft ntnd no mass or ascites +bs Ext bilat stump edema has resolved Neuro is alert, Ox 3 , nf    AVF LUA +bruit      Renal-related home meds: Auryxia  2 AC 3 times daily Toprol -XL 100 mg daily Lokelma  10 g every MWF Others: Antivert , Zofran , PPI, albuterol       OP HD: [facility] MWF 3.5h   B300    33kg     AVF   Heparin  none  Last OP HD 6/02, post wt 33.8kg   Good compliance, comes off 0-1 kg over Vdra 2.5 mcg mwf Sensipar  60mg  mwf     Assessment/ Plan: SOB/vol overload: esrd pt w/ R pleural effusion, and CT showing GG changes which usually is early pulm edema in esrd pts. Had HD 6/04 w/ 2.3 L UF and HD yest w/ 1.4 L off. Had chest tube placed 6/06 yesterday. She is not on O2 and SOB has resolved. Don't think there is anymore extra fluid to get w/ HD.  ESRD: on HD MWF. Had HD here Wed and Friday. Next HD Monday.  HTN: bp's stable, cont home meds Volume: may need dry wt lowered in OP setting. Hard to get a good wt here. 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 Follow.  Secondary hyperparathyroidism: CCa in range, cont binders ac     Rob Zana Hesselbach MD  [facility] 01/02/2024, 11:15 AM  Recent Labs  Lab 12/30/23 0414 12/30/23 2008 12/31/23 0628 01/01/24 0900 01/01/24 0920  HGB 11.7*   < > 11.6* 11.2*  --   ALBUMIN 3.0*  --   --   --  2.8*  CALCIUM  7.7*  --  8.5*  --  8.0*  PHOS  --   --   --   --  6.5*  CREATININE 6.41*   < > 4.60*  --  6.45*  K  4.3  --  4.0  --  4.3   < > = values in this interval not displayed.   No results for input(s): "IRON ", "TIBC", "FERRITIN" in the last 168 hours. Inpatient medications:  Chlorhexidine  Gluconate Cloth  6 each Topical Q0600   ferric citrate   420 mg Oral BID WC   heparin   5,000 Units Subcutaneous Q8H   metoprolol  succinate  100 mg Oral Daily   multivitamin  1 tablet Oral Daily   pantoprazole   40 mg Oral Daily   sodium zirconium cyclosilicate   10 g Oral Q M,W,F    benzonatate , HYDROmorphone  (DILAUDID ) injection, HYDROmorphone , ipratropium-albuterol , ondansetron  (ZOFRAN ) IV, ondansetron 

## 2024-01-02 NOTE — Plan of Care (Signed)

## 2024-01-03 DIAGNOSIS — J90 Pleural effusion, not elsewhere classified: Secondary | ICD-10-CM | POA: Diagnosis not present

## 2024-01-03 DIAGNOSIS — Z938 Other artificial opening status: Secondary | ICD-10-CM | POA: Diagnosis not present

## 2024-01-03 DIAGNOSIS — I10 Essential (primary) hypertension: Secondary | ICD-10-CM | POA: Diagnosis not present

## 2024-01-03 DIAGNOSIS — N186 End stage renal disease: Secondary | ICD-10-CM | POA: Diagnosis not present

## 2024-01-03 LAB — BODY FLUID CELL COUNT WITH DIFFERENTIAL
Eos, Fluid: 3 %
Lymphs, Fluid: 11 %
Monocyte-Macrophage-Serous Fluid: 10 % — ABNORMAL LOW (ref 50–90)
Neutrophil Count, Fluid: 76 % — ABNORMAL HIGH (ref 0–25)
Total Nucleated Cell Count, Fluid: 579 uL (ref 0–1000)

## 2024-01-03 LAB — LACTATE DEHYDROGENASE, PLEURAL OR PERITONEAL FLUID: LD, Fluid: 343 U/L — ABNORMAL HIGH (ref 3–23)

## 2024-01-03 LAB — ALBUMIN, PLEURAL OR PERITONEAL FLUID: Albumin, Fluid: 1.5 g/dL

## 2024-01-03 LAB — GLUCOSE, PLEURAL OR PERITONEAL FLUID: Glucose, Fluid: 78 mg/dL

## 2024-01-03 LAB — PROTEIN, PLEURAL OR PERITONEAL FLUID: Total protein, fluid: <3 g/dL

## 2024-01-03 MED ORDER — CHLORHEXIDINE GLUCONATE CLOTH 2 % EX PADS
6.0000 | MEDICATED_PAD | Freq: Every day | CUTANEOUS | Status: DC
  Administered 2024-01-04: 6 via TOPICAL

## 2024-01-03 MED ORDER — CYCLOBENZAPRINE HCL 5 MG PO TABS
2.5000 mg | ORAL_TABLET | Freq: Once | ORAL | Status: AC
  Administered 2024-01-03: 2.5 mg via ORAL
  Filled 2024-01-03: qty 1

## 2024-01-03 NOTE — Progress Notes (Signed)
 All excess linen and objects removed from bed prior to weighing.

## 2024-01-03 NOTE — Progress Notes (Signed)
 Reviewed chest x-ray  No pneumothorax noted  Continue current care plan

## 2024-01-03 NOTE — Progress Notes (Signed)
 [facility] Progress Note   Subjective:   Patient seen and examined at bedside. Tired today, cramping improved.  She is hoping the chest tube will come out soon.  No other specific complaints.   Objective Vitals:   01/03/24 0027 01/03/24 0419 01/03/24 0506 01/03/24 0724  BP: 102/64 96/64 97/65  100/64  Pulse: 98 98 94 100  Resp: 18 16 19 16   Temp: 98 F (36.7 C)  97.7 F (36.5 C) 98.2 F (36.8 C)  TempSrc: Oral  Oral   SpO2: 92% 97% 99% 96%  Weight:  32 kg    Height:       Physical Exam General:alert female in NAD Heart:RRR Lungs:nml WOB on RA, R chest tube in place Abdomen:soft, NTND Extremities:no LE edema Dialysis Access: LU AVF   Filed Weights   01/01/24 1300 01/02/24 0456 01/03/24 0419  Weight: 34.4 kg 35.9 kg 32 kg    Intake/Output Summary (Last 24 hours) at 01/03/2024 1213 Last data filed at 01/03/2024 0719 Gross per 24 hour  Intake 271.99 ml  Output 40 ml  Net 231.99 ml    Additional Objective Labs: Basic Metabolic Panel: Recent Labs  Lab 12/30/23 0414 12/30/23 2008 12/31/23 0628 01/01/24 0920  NA 140  --  138 139  K 4.3  --  4.0 4.3  CL 97*  --  99 100  CO2 24  --  26 25  GLUCOSE 88  --  82 122*  BUN 42*  --  27* 40*  CREATININE 6.41* 3.13* 4.60* 6.45*  CALCIUM  7.7*  --  8.5* 8.0*  PHOS  --   --   --  6.5*   Liver Function Tests: Recent Labs  Lab 12/30/23 0414 01/01/24 0920  AST 26  --   ALT 16  --   ALKPHOS 114  --   BILITOT 1.2  --   PROT 6.8  --   ALBUMIN 3.0* 2.8*   CBC: Recent Labs  Lab 12/30/23 0414 12/30/23 2008 12/31/23 0628 01/01/24 0900  WBC 7.1 5.4 5.8 5.3  HGB 11.7* 11.3* 11.6* 11.2*  HCT 39.1 36.9 38.4 36.5  MCV 100.8* 98.7 101.3* 101.1*  PLT 132* 125* 123* 132*   Blood Culture    Component Value Date/Time   SDES PLEURAL 01/01/2024 1617   SPECREQUEST PLEURAL 01/01/2024 1617   CULT  01/01/2024 1617    NO GROWTH < 24 HOURS Performed at [facility], [address], [place], [place]  [address]    REPTSTATUS PENDING 01/01/2024 1617    CBG: Recent Labs  Lab 12/30/23 1751 01/01/24 0057  GLUCAP 75 111*    Studies/Results: DG CHEST PORT 1 VIEW Result Date: 01/03/2024 CLINICAL DATA:  Follow-up pleural effusion EXAM: PORTABLE CHEST 1 VIEW COMPARISON:  Film from earlier in the same day. FINDINGS: Cardiac shadow is stable. Pigtail catheter is again noted on the right. No evidence of significant effusion or pneumothorax is noted. No focal infiltrate is seen. IMPRESSION: No acute abnormality noted. Pigtail catheter remains in place on the right. Electronically Signed   By: Violeta Grey M.D.   On: 01/03/2024 00:18   CT CHEST WO CONTRAST Result Date: 01/02/2024 CLINICAL DATA:  Follow-up right pleural effusion. Status post chest tube placement. EXAM: CT CHEST WITHOUT CONTRAST TECHNIQUE: Multidetector CT imaging of the chest was performed following the standard protocol without IV contrast. RADIATION DOSE REDUCTION: This exam was performed according to the departmental dose-optimization program which includes automated exposure control, adjustment of the mA and/or kV  according to patient size and/or use of iterative reconstruction technique. COMPARISON:  Chest CT 12/30/2023 FINDINGS: Cardiovascular: Stable cardiac enlargement and pericardial effusion. Age advanced vascular calcifications again demonstrated. Mediastinum/Nodes: No mediastinal or hilar mass or overt adenopathy. The esophagus is grossly normal. Lungs/Pleura: Right pleural drainage catheter in place. Small pneumothorax noted. The pleural effusion has been completely evacuated. Significant airspace disease, most notably in the right middle lobe. Although this could be re-expansion edema, infiltrate/pneumonia is also possible. Minimal left basilar atelectasis. No worrisome pulmonary lesions. Upper Abdomen: No significant upper abdominal findings. Advanced vascular disease for age. Musculoskeletal: Significant spinal deformity. IMPRESSION:  1. Right pleural drainage catheter in place with small pneumothorax. The pleural effusion has been completely evacuated. 2. Significant airspace disease, most notably in the right middle lobe. Although this could be re-expansion edema, infiltrate/pneumonia is also possible. 3. Stable cardiac enlargement and pericardial effusion. 4. Age advanced vascular calcifications. Electronically Signed   By: Marrian Siva M.D.   On: 01/02/2024 16:17   DG CHEST PORT 1 VIEW Result Date: 01/02/2024 CLINICAL DATA:  Pleural effusion. EXAM: PORTABLE CHEST 1 VIEW COMPARISON:  Chest radiograph dated 12/30/2023. FINDINGS: Right-sided chest tube with tip over the right upper lung. Significant decrease with near complete resolution of right pleural effusion. There is re-expansion of the right lung. No pneumothorax. Mild cardiomegaly. No acute osseous pathology. IMPRESSION: Right-sided chest tube with near complete resolution of right pleural effusion. Electronically Signed   By: Angus Bark M.D.   On: 01/02/2024 10:05   IR PERC PLEURAL DRAIN W/INDWELL CATH W/IMG GUIDE Result Date: 01/01/2024 INDICATION: 33 year old female with complicated right anterior pleural effusion. She presents for placement of a pigtail chest tube. EXAM: Ultrasound and fluoroscopy guided pigtail chest tube placement MEDICATIONS: The patient is currently admitted to the hospital and receiving intravenous antibiotics. The antibiotics were administered within an appropriate time frame prior to the initiation of the procedure. ANESTHESIA/SEDATION: None. COMPLICATIONS: None immediate. PROCEDURE: Informed written consent was obtained from the patient after a thorough discussion of the procedural risks, benefits and alternatives. All questions were addressed. Maximal Sterile Barrier Technique was utilized including caps, mask, sterile gowns, sterile gloves, sterile drape, hand hygiene and skin antiseptic. A timeout was performed prior to the initiation of the  procedure. The right chest was interrogated with ultrasound. The complex parole effusion is identified. A suitable skin entry site was selected and marked. Local anesthesia was attained by infiltration with 1% lidocaine . A small dermatotomy was made. Under real-time ultrasound guidance, an 18 gauge trocar needle was carefully advanced over a rib and into the pleural space. A 0.035 wire was advanced into the pleural space. The needle was removed. The tract was dilated to 10 [place]. A skater 10 [place] all-purpose drainage catheter was advanced over the wire and formed. Aspiration was then performed yielding 600 mL of bloody pleural fluid. The catheter was then connected to an atrium and secured to the skin with 0 Prolene suture. Bandages were applied. IMPRESSION: Successful placement of a 10 French percutaneous pigtail chest tube into the complex right pleural effusion. Successful evacuation of 600 mL bloody pleural fluid. A sample was sent for culture. Electronically Signed   By: Fernando Hoyer M.D.   On: 01/01/2024 16:21    Medications:   Chlorhexidine  Gluconate Cloth  6 each Topical Q0600   ferric citrate   420 mg Oral BID WC   heparin   5,000 Units Subcutaneous Q8H   metoprolol  succinate  100 mg Oral Daily   midodrine  5  mg Oral BID WC   multivitamin  1 tablet Oral Daily   pantoprazole   40 mg Oral Daily   sodium zirconium cyclosilicate   10 g Oral Q M,W,F    Dialysis Orders: [facility] MWF 3.5h   B300    33kg     AVF   Heparin  none  Last OP HD 6/02, post wt 33.8kg   Good compliance, comes off 0-1 kg over Vdra 2.5 mcg mwf Sensipar  60mg  mwf     Assessment/ Plan: SOB/vol overload: esrd pt w/ R pleural effusion, and CT showing GG changes which usually is early pulm edema in esrd pts. Had HD 6/04 w/ 2.3 L UF and HD yest w/ 1.4 L off. Had chest tube placed 6/06, output slowed. She is not on O2 and SOB has resolved. Volume status improved. Under EDW if weights correct.  ESRD: on HD MWF. Next HD on  01/04/24.  HTN: BP soft/stable. Hold metoprolol  for now.  Given midodrine yesterday.   Volume: may need dry wt lowered in OP setting. Hard to get a good wt here. 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 Follow.  Secondary hyperparathyroidism: CCa in range, phos elevated. cont binders, sensipar  and VDRA Nutrition - renal diet w/fluid restrictions.   Charlotte Cookey, PA-C [facility] 01/03/2024,12:13 PM  LOS: 3 days

## 2024-01-03 NOTE — Progress Notes (Signed)
 PROGRESS NOTE    [name]  DGU:[id number] DOB: November 16, 1990 DOA: 12/30/2023 PCP: [facility]  Subjective: Pt seen and examined.  Stable. Discussed with PCCM. Plan for removal of chest tube today. Possible DC to home this evening.   Hospital Course: HPI:  [name] is a 33 y.o. female with medical history significant of spina bifida, HTN, ESRD (HD on MWF), and depression p/w SOB iso R pleural effusion.   Pt was in her USOH until this morning at 0300 when she experienced acute onset SOB while sitting up watching television for which pt activated EMS. Pt reports having her last HD session on Monday. Denies any sick contacts or recent medications changes.   In the ED, pt tachycardic and tachypneic on RA. Labs notable for BUN/Cr 42/6.41, BNP 1039, troponin 79-->78, and Hb 11.7.  CTA neg for PE, but did show large R pleural effusion with RLL and RML collapse. Pt admitted to medicine w/ Nephrology following.    Significant Events: Admitted 12/30/2023 for recurrent right pleural effusion   Admission Labs: WBC 7.1, HgB 11.7, plt 132 Na 140, K 4.3, CO2 of 24, BUN 42, scr 6.41, glu 88 TP 6.8, alb 3.0, AST 26, ALT 16, T. Bili 1.2 BNP 1039  Admission Imaging Studies: CXR Progressive right-sided airspace disease and pleural opacification. Most likely represents a loculated pleural effusion Airspace disease is concerning for pneumonia or post-obstructive disease. 2. Stable cardiac enlargement CTPA No pulmonary embolism. 2. Large right pleural effusion with associated right lower lobe collapse and partial right middle lobe collapse. 3. Diffuse ground-glass attenuation within the aerated lungs, no mass lesion. This likely reflects edema  Significant Labs:   Significant Imaging Studies: 01-02-2024 CT chest Right pleural drainage catheter in place with small pneumothorax. The pleural effusion has been completely evacuated. 2. Significant airspace disease, most notably in the  right middle lobe. Although this could be re-expansion edema, infiltrate/pneumonia is also possible. 3. Stable cardiac enlargement and pericardial effusion. 4. Age advanced vascular calcifications  Antibiotic Therapy: Anti-infectives (From admission, onward)    None       Procedures: 01-01-2024 right pigtail chest tube by IR  Consultants: nephrology    Assessment and Plan: * Recurrent pleural effusion on right 12-31-2023 PCCM consulted. Per PCCM notes, they will ask IR for thoracentesis. Pt states someone told her that she is not getting thoracentesis. She isn't sure who said that. PCCM recommended extra fluid removal with HD. Nephrology made aware of PCCM recs.  01-01-2024 s/p chest tube placement. Defer to PCCM if she needs intra-pleural lytics. IV dilaudid  ordered x 1 for pain. Po dilaudid  prn q4h prn.  01-02-2024 Has another 400 ml from chest tube since last night.  Total output thus far has been about 1.2 liters(600 ml from initial chest tube insertion) and the 800 ml in her chest atrium thus far. Today's CXR shows lots of improvement in pleural effusion since chest tube placement. Awaiting for PCCM to decide if pt needs intra-pleural lytics today.  01-03-2024 PCCM plans for chest tube removal today. Pt can home go later this evening.  S/P chest tube placement [facility]) - 01-01-2024 01-01-2024 placed due to loculated pleural effusion. Management per PCCM.  01-02-2024 Has another 400 ml from chest tube since last night.  Total output thus far has been about 1.2 liters(600 ml from initial chest tube insertion) and the 800 ml in her chest atrium thus far. Today's CXR shows lots of improvement in pleural effusion since chest tube placement. Awaiting  for PCCM to decide if pt needs intra-pleural lytics today.  01-03-2024 PCCM plans for chest tube removal today.  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 Wheelchair dependent.  01-01-2024 chronic.  01-02-2024 stable  01-03-2024  stable  Spina bifida (HCC) 12-31-2023 chronic.  01-01-2024 chronic.  01-02-2024 stable  01-03-2024 stable  Essential hypertension 12-31-2023 on Toprol -XL 100 mg daily.  01-01-2024 stable.  01-02-2024 stable  01-03-2024 bp was low yesterday. Given IVF and po midodrine.  End stage renal disease on dialysis [facility]) 12-31-2023 nephrology consulted for routine HD.  01-01-2024 had HD today(M W F schedule)  01-02-2024 stable. Will give po robaxin  due to muscle cramping.  Pt complaining of severe muscle cramping. She states she gets this when too much fluid is removed with HD   01-03-2024 stable. Continue outpatient HD on M, W, F.      DVT prophylaxis: heparin  injection 5,000 Units Start: 01/02/24 0600 Place and maintain sequential compression device Start: 12/31/23 0027    Code Status: Full Code Family Communication: discussed with pt and her husband nelson at bedside Disposition Plan: return home Reason for continuing need for hospitalization: medically stable for DC to home.  Objective: Vitals:   01/03/24 0419 01/03/24 0506 01/03/24 0724 01/03/24 1239  BP: 96/64 97/65 100/64 (!) 92/55  Pulse: 98 94 100 75  Resp: 16 19 16 18   Temp:  97.7 F (36.5 C) 98.2 F (36.8 C) 98.6 F (37 C)  TempSrc:  Oral  Oral  SpO2: 97% 99% 96% 100%  Weight: 32 kg     Height:        Intake/Output Summary (Last 24 hours) at 01/03/2024 1312 Last data filed at 01/03/2024 1237 Gross per 24 hour  Intake 980.99 ml  Output 60 ml  Net 920.99 ml   Filed Weights   01/01/24 1300 01/02/24 0456 01/03/24 0419  Weight: 34.4 kg 35.9 kg 32 kg    Examination:  Physical Exam Vitals and nursing note reviewed.  Constitutional:      General: She is not in acute distress.    Appearance: She is not toxic-appearing or diaphoretic.  HENT:     Head: Normocephalic and atraumatic.     Nose: Nose normal.  Pulmonary:     Effort: No respiratory distress.  Abdominal:     General: There is no distension.   Neurological:     Mental Status: She is alert and oriented to person, place, and time.     Data Reviewed: I have personally reviewed following labs and imaging studies  CBC: Recent Labs  Lab 12/30/23 0414 12/30/23 2008 12/31/23 0628 01/01/24 0900  WBC 7.1 5.4 5.8 5.3  HGB 11.7* 11.3* 11.6* 11.2*  HCT 39.1 36.9 38.4 36.5  MCV 100.8* 98.7 101.3* 101.1*  PLT 132* 125* 123* 132*   Basic Metabolic Panel: Recent Labs  Lab 12/30/23 0414 12/30/23 2008 12/31/23 0628 01/01/24 0920  NA 140  --  138 139  K 4.3  --  4.0 4.3  CL 97*  --  99 100  CO2 24  --  26 25  GLUCOSE 88  --  82 122*  BUN 42*  --  27* 40*  CREATININE 6.41* 3.13* 4.60* 6.45*  CALCIUM  7.7*  --  8.5* 8.0*  PHOS  --   --   --  6.5*   GFR: Estimated Creatinine Clearance: 1.7 mL/min (A) (by C-G formula based on SCr of 6.45 mg/dL (H)). Liver Function Tests: Recent Labs  Lab 12/30/23 0414 01/01/24 0920  AST 26  --  ALT 16  --   ALKPHOS 114  --   BILITOT 1.2  --   PROT 6.8  --   ALBUMIN 3.0* 2.8*   BNP (last 3 results) Recent Labs    07/15/23 0405 07/23/23 2000 12/30/23 0414  BNP 1,979.9* 2,478.9* 1,039.6*   CBG: Recent Labs  Lab 12/30/23 1751 01/01/24 0057  GLUCAP 75 111*   Sepsis Labs: Recent Labs  Lab 12/30/23 2008  PROCALCITON 0.96    Recent Results (from the past 240 hours)  MRSA Next Gen by PCR, Nasal     Status: None   Collection Time: 12/30/23  8:53 PM   Specimen: Nasal Mucosa; Nasal Swab  Result Value Ref Range Status   MRSA by PCR Next Gen NOT DETECTED NOT DETECTED Final    Comment: (NOTE) The GeneXpert MRSA Assay (FDA approved for NASAL specimens only), is one component of a comprehensive MRSA colonization surveillance program. It is not intended to diagnose MRSA infection nor to guide or monitor treatment for MRSA infections. Test performance is not FDA approved in patients less than 33 years old. Performed at [facility], [address] [place],  [place] [address]   Body fluid culture w Gram Stain     Status: None (Preliminary result)   Collection Time: 01/01/24  4:17 PM   Specimen: Pleura  Result Value Ref Range Status   Specimen Description PLEURAL  Final   Special Requests PLEURAL  Final   Gram Stain NO WBC SEEN NO ORGANISMS SEEN   Final   Culture   Final    NO GROWTH 2 DAYS Performed at [facility], [address] [place], [place] [address]    Report Status PENDING  Incomplete     Radiology Studies: DG CHEST PORT 1 VIEW Result Date: 01/03/2024 CLINICAL DATA:  Follow-up pleural effusion EXAM: PORTABLE CHEST 1 VIEW COMPARISON:  Film from earlier in the same day. FINDINGS: Cardiac shadow is stable. Pigtail catheter is again noted on the right. No evidence of significant effusion or pneumothorax is noted. No focal infiltrate is seen. IMPRESSION: No acute abnormality noted. Pigtail catheter remains in place on the right. Electronically Signed   By: Violeta Grey M.D.   On: 01/03/2024 00:18   CT CHEST WO CONTRAST Result Date: 01/02/2024 CLINICAL DATA:  Follow-up right pleural effusion. Status post chest tube placement. EXAM: CT CHEST WITHOUT CONTRAST TECHNIQUE: Multidetector CT imaging of the chest was performed following the standard protocol without IV contrast. RADIATION DOSE REDUCTION: This exam was performed according to the departmental dose-optimization program which includes automated exposure control, adjustment of the mA and/or kV according to patient size and/or use of iterative reconstruction technique. COMPARISON:  Chest CT 12/30/2023 FINDINGS: Cardiovascular: Stable cardiac enlargement and pericardial effusion. Age advanced vascular calcifications again demonstrated. Mediastinum/Nodes: No mediastinal or hilar mass or overt adenopathy. The esophagus is grossly normal. Lungs/Pleura: Right pleural drainage catheter in place. Small pneumothorax noted. The pleural effusion has been completely evacuated. Significant airspace  disease, most notably in the right middle lobe. Although this could be re-expansion edema, infiltrate/pneumonia is also possible. Minimal left basilar atelectasis. No worrisome pulmonary lesions. Upper Abdomen: No significant upper abdominal findings. Advanced vascular disease for age. Musculoskeletal: Significant spinal deformity. IMPRESSION: 1. Right pleural drainage catheter in place with small pneumothorax. The pleural effusion has been completely evacuated. 2. Significant airspace disease, most notably in the right middle lobe. Although this could be re-expansion edema, infiltrate/pneumonia is also possible. 3. Stable cardiac enlargement and  pericardial effusion. 4. Age advanced vascular calcifications. Electronically Signed   By: Marrian Siva M.D.   On: 01/02/2024 16:17   DG CHEST PORT 1 VIEW Result Date: 01/02/2024 CLINICAL DATA:  Pleural effusion. EXAM: PORTABLE CHEST 1 VIEW COMPARISON:  Chest radiograph dated 12/30/2023. FINDINGS: Right-sided chest tube with tip over the right upper lung. Significant decrease with near complete resolution of right pleural effusion. There is re-expansion of the right lung. No pneumothorax. Mild cardiomegaly. No acute osseous pathology. IMPRESSION: Right-sided chest tube with near complete resolution of right pleural effusion. Electronically Signed   By: Angus Bark M.D.   On: 01/02/2024 10:05   IR PERC PLEURAL DRAIN W/INDWELL CATH W/IMG GUIDE Result Date: 01/01/2024 INDICATION: 33 year old female with complicated right anterior pleural effusion. She presents for placement of a pigtail chest tube. EXAM: Ultrasound and fluoroscopy guided pigtail chest tube placement MEDICATIONS: The patient is currently admitted to the hospital and receiving intravenous antibiotics. The antibiotics were administered within an appropriate time frame prior to the initiation of the procedure. ANESTHESIA/SEDATION: None. COMPLICATIONS: None immediate. PROCEDURE: Informed written consent  was obtained from the patient after a thorough discussion of the procedural risks, benefits and alternatives. All questions were addressed. Maximal Sterile Barrier Technique was utilized including caps, mask, sterile gowns, sterile gloves, sterile drape, hand hygiene and skin antiseptic. A timeout was performed prior to the initiation of the procedure. The right chest was interrogated with ultrasound. The complex parole effusion is identified. A suitable skin entry site was selected and marked. Local anesthesia was attained by infiltration with 1% lidocaine . A small dermatotomy was made. Under real-time ultrasound guidance, an 18 gauge trocar needle was carefully advanced over a rib and into the pleural space. A 0.035 wire was advanced into the pleural space. The needle was removed. The tract was dilated to 10 [place]. A skater 10 [place] all-purpose drainage catheter was advanced over the wire and formed. Aspiration was then performed yielding 600 mL of bloody pleural fluid. The catheter was then connected to an atrium and secured to the skin with 0 Prolene suture. Bandages were applied. IMPRESSION: Successful placement of a 10 French percutaneous pigtail chest tube into the complex right pleural effusion. Successful evacuation of 600 mL bloody pleural fluid. A sample was sent for culture. Electronically Signed   By: Fernando Hoyer M.D.   On: 01/01/2024 16:21    Scheduled Meds:  Chlorhexidine  Gluconate Cloth  6 each Topical Q0600   [START ON 01/04/2024] Chlorhexidine  Gluconate Cloth  6 each Topical Q0600   ferric citrate   420 mg Oral BID WC   heparin   5,000 Units Subcutaneous Q8H   metoprolol  succinate  100 mg Oral Daily   midodrine  5 mg Oral BID WC   multivitamin  1 tablet Oral Daily   pantoprazole   40 mg Oral Daily   sodium zirconium cyclosilicate   10 g Oral Q M,W,F   Continuous Infusions:   LOS: 3 days   Time spent: 50 minutes  Unk Garb, DO  [facility]  01/03/2024, 1:12 PM

## 2024-01-03 NOTE — Plan of Care (Signed)

## 2024-01-03 NOTE — Progress Notes (Signed)
 NAME:  [name], MRN:  [id number], DOB:  07-09-1991, LOS: 3 ADMISSION DATE:  12/30/2023, CONSULTATION DATE: 12/31/2023 REFERRING MD: [facility], CHIEF COMPLAINT: Shortness of breath  History of Present Illness:  33 year old female who has a extremely complicated past medical history that entails but is not limited to spina bifida, obstructive sleep apnea, wheelchair dependent, end-stage renal disease, who presents with chief complaints of shortness of breath.  She has a right loculated pleural effusion that would be more amenable to interventional radiology drainage down from pulmonary.  May give consideration to continuing to obtain the drier weight she has no fevers chills sweats or other indication of infection.  Pertinent  Medical History    has a past medical history of Acute asthma exacerbation (07/15/2023), Anemia associated with chronic renal failure, Blood transfusion, Caudal regression syndrome, Depression (05/14/2015), Dialysis care, ESRD (end stage renal disease) on dialysis (HCC), GERD (gastroesophageal reflux disease) (01/07/2017), HTN (hypertension) (05/02/2011), Murmur, cardiac (07/17/2022), OSA (obstructive sleep apnea) (10/26/2023), Pneumonia, unspecified organism (07/27/2023), Spina bifida, UTI (lower urinary tract infection), and Viral URI with cough (09/15/2022).   has a past surgical history that includes AV fistula placement; Insertion of dialysis catheter (N/A, 05/08/2020); IR Fluoro Guide CV Line Right (05/09/2020); IR US  Guide Vasc Access Right (05/09/2020); A/V Fistulagram (Left, 10/14/2021); RIGHT HEART CATH (N/A, 06/30/2023); and IR PERC PLEURAL DRAIN W/INDWELL CATH W/IMG GUIDE (01/01/2024).   Significant Hospital Events: Including procedures, antibiotic start and stop dates in addition to other pertinent events   6/6 - IR gudided chest tube 67 - post chst tube C with small PRtx, and REPE  Interim History / Subjective:   6/8 - Drained 40cc - 60cci in 12h. 20cc since this  morning . Feels well. Wants chesttub out. Overnight chest tube clamped for few hours and cxr without fluid or Ptx.    Objective    Blood pressure 100/64, pulse 100, temperature 98.2 F (36.8 C), resp. rate 16, height 3\' 1"  (0.94 m), weight 32 kg, SpO2 96%.        Intake/Output Summary (Last 24 hours) at 01/03/2024 1110 Last data filed at 01/03/2024 0719 Gross per 24 hour  Intake 271.99 ml  Output 40 ml  Net 231.99 ml   Filed Weights   01/01/24 1300 01/02/24 0456 01/03/24 0419  Weight: 34.4 kg 35.9 kg 32 kg    Examination:  General: No distress. Looks well. SHORT, SPINDA BIFIDA Neuro: Alert and Oriented x 3. GCS 15. Speech normal Psych: Pleasant Resp:  Barrel Chest - no.  Wheeze - no, Crackles - o, No overt respiratory distress. Good AE eual CVS: Normal heart sounds. Murmurs - no Ext: Stigmata of Connective Tissue Disease - no HEENT: Normal upper airway. PEERL +. No post nasal drip    Resolved problem list   Assessment and Plan  Right loculated v non-locuaed effusion in the setting of end-stage renal disease without complaint of fever, chills, sweats or purulent sputum.  This is an anterior component to her left leg most likely require an anterior approach for thoracentesis.   6/7 cxr post chest tube suggests near total drainage of Rt pleural effusion 6/8 cxr ok after chest tube was clamped. Drainage is minimal   PLAN  -await pleural fluid analysis opf fluid from drain tat should have been sent out 01/02/24 -remove chest tube 01/03/2024 -> get xr 4h later -> if ok can go hiome     End-stage renal disease with a fistula left bicep For the renal  Spina bifida  Wheelchair-bound    Best Practice (right click and "Control and instrumentation engineer" daily)   Per [facility]   D/w Dr Mliss Anderson    Dr. Maire Scot, M.D., F.C.C.P,  Pulmonary and Critical Care Medicine Staff Physician, [facility] Director - Interstitial Lung Disease  Program   Pulmonary Fibrosis [facility] at [facility] [place], [place], [address]   Pager: [phone], If no answer  -> Check AMION or Try [phone] Telephone (clinical office): [phone] Telephone (research): [phone]  11:10 AM 01/03/2024    LABS    PULMONARY No results for input(s): "PHART", "PCO2ART", "PO2ART", "HCO3", "TCO2", "O2SAT" in the last 168 hours.  Invalid input(s): "PCO2", "PO2"  CBC Recent Labs  Lab 12/30/23 2008 12/31/23 0628 01/01/24 0900  HGB 11.3* 11.6* 11.2*  HCT 36.9 38.4 36.5  WBC 5.4 5.8 5.3  PLT 125* 123* 132*    COAGULATION No results for input(s): "INR" in the last 168 hours.  CARDIAC  No results for input(s): "TROPONINI" in the last 168 hours. No results for input(s): "PROBNP" in the last 168 hours.   CHEMISTRY Recent Labs  Lab 12/30/23 0414 12/30/23 2008 12/31/23 0628 01/01/24 0920  NA 140  --  138 139  K 4.3  --  4.0 4.3  CL 97*  --  99 100  CO2 24  --  26 25  GLUCOSE 88  --  82 122*  BUN 42*  --  27* 40*  CREATININE 6.41* 3.13* 4.60* 6.45*  CALCIUM  7.7*  --  8.5* 8.0*  PHOS  --   --   --  6.5*   Estimated Creatinine Clearance: 1.7 mL/min (A) (by C-G formula based on SCr of 6.45 mg/dL (H)).   LIVER Recent Labs  Lab 12/30/23 0414 01/01/24 0920  AST 26  --   ALT 16  --   ALKPHOS 114  --   BILITOT 1.2  --   PROT 6.8  --   ALBUMIN 3.0* 2.8*     INFECTIOUS Recent Labs  Lab 12/30/23 2008  PROCALCITON 0.96     ENDOCRINE CBG (last 3)  Recent Labs    01/01/24 0057  GLUCAP 111*         IMAGING x48h  - image(s) personally visualized  -   highlighted in bold DG CHEST PORT 1 VIEW Result Date: 01/03/2024 CLINICAL DATA:  Follow-up pleural effusion EXAM: PORTABLE CHEST 1 VIEW COMPARISON:  Film from earlier in the same day. FINDINGS: Cardiac shadow is stable. Pigtail catheter is again noted on the right. No evidence of significant effusion or pneumothorax is noted. No focal infiltrate  is seen. IMPRESSION: No acute abnormality noted. Pigtail catheter remains in place on the right. Electronically Signed   By: Violeta Grey M.D.   On: 01/03/2024 00:18   CT CHEST WO CONTRAST Result Date: 01/02/2024 CLINICAL DATA:  Follow-up right pleural effusion. Status post chest tube placement. EXAM: CT CHEST WITHOUT CONTRAST TECHNIQUE: Multidetector CT imaging of the chest was performed following the standard protocol without IV contrast. RADIATION DOSE REDUCTION: This exam was performed according to the departmental dose-optimization program which includes automated exposure control, adjustment of the mA and/or kV according to patient size and/or use of iterative reconstruction technique. COMPARISON:  Chest CT 12/30/2023 FINDINGS: Cardiovascular: Stable cardiac enlargement and pericardial effusion. Age advanced vascular calcifications again demonstrated. Mediastinum/Nodes: No mediastinal or hilar mass or overt adenopathy. The esophagus is grossly normal. Lungs/Pleura: Right pleural  drainage catheter in place. Small pneumothorax noted. The pleural effusion has been completely evacuated. Significant airspace disease, most notably in the right middle lobe. Although this could be re-expansion edema, infiltrate/pneumonia is also possible. Minimal left basilar atelectasis. No worrisome pulmonary lesions. Upper Abdomen: No significant upper abdominal findings. Advanced vascular disease for age. Musculoskeletal: Significant spinal deformity. IMPRESSION: 1. Right pleural drainage catheter in place with small pneumothorax. The pleural effusion has been completely evacuated. 2. Significant airspace disease, most notably in the right middle lobe. Although this could be re-expansion edema, infiltrate/pneumonia is also possible. 3. Stable cardiac enlargement and pericardial effusion. 4. Age advanced vascular calcifications. Electronically Signed   By: Marrian Siva M.D.   On: 01/02/2024 16:17   DG CHEST PORT 1 VIEW Result  Date: 01/02/2024 CLINICAL DATA:  Pleural effusion. EXAM: PORTABLE CHEST 1 VIEW COMPARISON:  Chest radiograph dated 12/30/2023. FINDINGS: Right-sided chest tube with tip over the right upper lung. Significant decrease with near complete resolution of right pleural effusion. There is re-expansion of the right lung. No pneumothorax. Mild cardiomegaly. No acute osseous pathology. IMPRESSION: Right-sided chest tube with near complete resolution of right pleural effusion. Electronically Signed   By: Angus Bark M.D.   On: 01/02/2024 10:05   IR PERC PLEURAL DRAIN W/INDWELL CATH W/IMG GUIDE Result Date: 01/01/2024 INDICATION: 33 year old female with complicated right anterior pleural effusion. She presents for placement of a pigtail chest tube. EXAM: Ultrasound and fluoroscopy guided pigtail chest tube placement MEDICATIONS: The patient is currently admitted to the hospital and receiving intravenous antibiotics. The antibiotics were administered within an appropriate time frame prior to the initiation of the procedure. ANESTHESIA/SEDATION: None. COMPLICATIONS: None immediate. PROCEDURE: Informed written consent was obtained from the patient after a thorough discussion of the procedural risks, benefits and alternatives. All questions were addressed. Maximal Sterile Barrier Technique was utilized including caps, mask, sterile gowns, sterile gloves, sterile drape, hand hygiene and skin antiseptic. A timeout was performed prior to the initiation of the procedure. The right chest was interrogated with ultrasound. The complex parole effusion is identified. A suitable skin entry site was selected and marked. Local anesthesia was attained by infiltration with 1% lidocaine . A small dermatotomy was made. Under real-time ultrasound guidance, an 18 gauge trocar needle was carefully advanced over a rib and into the pleural space. A 0.035 wire was advanced into the pleural space. The needle was removed. The tract was dilated to 10  [place]. A skater 10 [place] all-purpose drainage catheter was advanced over the wire and formed. Aspiration was then performed yielding 600 mL of bloody pleural fluid. The catheter was then connected to an atrium and secured to the skin with 0 Prolene suture. Bandages were applied. IMPRESSION: Successful placement of a 10 French percutaneous pigtail chest tube into the complex right pleural effusion. Successful evacuation of 600 mL bloody pleural fluid. A sample was sent for culture. Electronically Signed   By: Fernando Hoyer M.D.   On: 01/01/2024 16:21

## 2024-01-04 ENCOUNTER — Telehealth: Payer: Self-pay | Admitting: Internal Medicine

## 2024-01-04 DIAGNOSIS — I10 Essential (primary) hypertension: Secondary | ICD-10-CM | POA: Diagnosis not present

## 2024-01-04 DIAGNOSIS — N186 End stage renal disease: Secondary | ICD-10-CM | POA: Diagnosis not present

## 2024-01-04 DIAGNOSIS — Z938 Other artificial opening status: Secondary | ICD-10-CM | POA: Diagnosis not present

## 2024-01-04 DIAGNOSIS — J90 Pleural effusion, not elsewhere classified: Secondary | ICD-10-CM | POA: Diagnosis not present

## 2024-01-04 LAB — CBC WITH DIFFERENTIAL/PLATELET
Abs Immature Granulocytes: 0.02 10*3/uL (ref 0.00–0.07)
Basophils Absolute: 0 10*3/uL (ref 0.0–0.1)
Basophils Relative: 1 %
Eosinophils Absolute: 0.3 10*3/uL (ref 0.0–0.5)
Eosinophils Relative: 5 %
HCT: 37.1 % (ref 36.0–46.0)
Hemoglobin: 11.4 g/dL — ABNORMAL LOW (ref 12.0–15.0)
Immature Granulocytes: 0 %
Lymphocytes Relative: 16 %
Lymphs Abs: 1.1 10*3/uL (ref 0.7–4.0)
MCH: 30.8 pg (ref 26.0–34.0)
MCHC: 30.7 g/dL (ref 30.0–36.0)
MCV: 100.3 fL — ABNORMAL HIGH (ref 80.0–100.0)
Monocytes Absolute: 0.7 10*3/uL (ref 0.1–1.0)
Monocytes Relative: 11 %
Neutro Abs: 4.4 10*3/uL (ref 1.7–7.7)
Neutrophils Relative %: 67 %
Platelets: 170 10*3/uL (ref 150–400)
RBC: 3.7 MIL/uL — ABNORMAL LOW (ref 3.87–5.11)
RDW: 15.8 % — ABNORMAL HIGH (ref 11.5–15.5)
WBC: 6.6 10*3/uL (ref 4.0–10.5)
nRBC: 0 % (ref 0.0–0.2)

## 2024-01-04 LAB — BASIC METABOLIC PANEL WITH GFR
Anion gap: 15 (ref 5–15)
BUN: 49 mg/dL — ABNORMAL HIGH (ref 6–20)
CO2: 25 mmol/L (ref 22–32)
Calcium: 7.1 mg/dL — ABNORMAL LOW (ref 8.9–10.3)
Chloride: 96 mmol/L — ABNORMAL LOW (ref 98–111)
Creatinine, Ser: 7.81 mg/dL — ABNORMAL HIGH (ref 0.44–1.00)
GFR, Estimated: 7 mL/min — ABNORMAL LOW (ref >60)
Glucose, Bld: 100 mg/dL — ABNORMAL HIGH (ref 70–99)
Potassium: 4.5 mmol/L (ref 3.5–5.1)
Sodium: 136 mmol/L (ref 135–145)

## 2024-01-04 LAB — BODY FLUID CULTURE W GRAM STAIN
Culture: NO GROWTH
Gram Stain: NONE SEEN

## 2024-01-04 MED ORDER — METOPROLOL SUCCINATE ER 50 MG PO TB24: 50.0000 mg | ORAL_TABLET | Freq: Every day | ORAL | Status: DC

## 2024-01-04 MED ORDER — MIDODRINE HCL 5 MG PO TABS
ORAL_TABLET | ORAL | Status: AC
  Filled 2024-01-04: qty 1

## 2024-01-04 NOTE — Progress Notes (Signed)
 01/04/2024  Seen in f/u for pleural effusion management  S: Seen in HD, no issues, breathing comfortably, wants tube out.  O:    01/04/2024    1:22 PM 01/04/2024   12:53 PM 01/04/2024   12:10 PM  Vitals with BMI  Weight 70 lbs 9 oz     BMI 36.22    Systolic 113 104 161  Diastolic 74 54 52  Pulse 83 86 81     Significant value   No distress Pigtail with cloudy yellow fluid, no tidaling or air leak CXR and CT reviewed  A: Exudative pleural effusion in ESRD patient, could be old parapneumonic; cytology pending  P: Okay to DC chest tube Do not see role to repeat CXR unless symptoms develop Will arrange tentative f/u for effusion in 2-3 weeks to go over cyto and symptoms   Ardelle Kos MD Pulmonary Critical Care Medicine [employer] if during day (7a-7p) [phone] if after hours (7p-7a)

## 2024-01-04 NOTE — Progress Notes (Signed)
 D/C order noted. Contacted [facility] to advise staff of pt's d/c today and that pt should resume care on Wednesday.   Lauraine Polite Renal Navigator [phone]

## 2024-01-04 NOTE — Telephone Encounter (Signed)
 2-3 weeks with any to review pleural fluid studies and symptoms

## 2024-01-04 NOTE — Plan of Care (Signed)

## 2024-01-04 NOTE — Plan of Care (Signed)

## 2024-01-04 NOTE — Telephone Encounter (Signed)
 Patient scheduled.

## 2024-01-04 NOTE — Progress Notes (Signed)
 [facility] NEPHROLOGY PROGRESS NOTE  Assessment/ Plan: Pt is a 33 y.o. yo female   Dialysis Orders: [facility] MWF 3.5h   B300    33kg     AVF   Heparin  none  Last OP HD 6/02, post wt 33.8kg   Good compliance, comes off 0-1 kg over Vdra 2.5 mcg mwf Sensipar  60mg  mwf  # SOB/vol overload: esrd pt w/ R pleural effusion.  Chest tube per pulmonary team.  UF with HD.  Volume status is improving.   #ESRD: on HD MWF. HD today.   #HTN/volume: BP soft/stable.  Getting midodrine.  Monitor BP.  Mid to lower dry weight as outpatient. 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 Follow.   #Secondary hyperparathyroidism: CCa in range, phos elevated. cont binders, sensipar  and VDRA OP.  #Nutrition - renal diet w/fluid restrictions.   Subjective: Denies nausea, vomiting, chest pain, shortness of breath.  Dialysis today. Objective Vital signs in last 24 hours: Vitals:   01/04/24 0856 01/04/24 0908 01/04/24 0938 01/04/24 1012  BP: 100/63 101/63 101/78 108/68  Pulse: 73 70 66 68  Resp: 11 17 (!) 22 18  Temp:      TempSrc:      SpO2: 100% 100% 97% 100%  Weight:      Height:       Weight change:   Intake/Output Summary (Last 24 hours) at 01/04/2024 1032 Last data filed at 01/04/2024 0655 Gross per 24 hour  Intake 709 ml  Output 120 ml  Net 589 ml       Labs: RENAL PANEL Recent Labs  Lab 12/30/23 0414 12/30/23 2008 12/31/23 0628 01/01/24 0920 01/04/24 0419  NA 140  --  138 139 136  K 4.3  --  4.0 4.3 4.5  CL 97*  --  99 100 96*  CO2 24  --  26 25 25   GLUCOSE 88  --  82 122* 100*  BUN 42*  --  27* 40* 49*  CREATININE 6.41* 3.13* 4.60* 6.45* 7.81*  CALCIUM  7.7*  --  8.5* 8.0* 7.1*  PHOS  --   --   --  6.5*  --   ALBUMIN 3.0*  --   --  2.8*  --     Liver Function Tests: Recent Labs  Lab 12/30/23 0414 01/01/24 0920  AST 26  --   ALT 16  --   ALKPHOS 114  --   BILITOT 1.2  --   PROT 6.8  --   ALBUMIN 3.0* 2.8*   No results for input(s): "LIPASE",  "AMYLASE" in the last 168 hours. No results for input(s): "AMMONIA" in the last 168 hours. CBC: Recent Labs    07/25/23 1525 07/26/23 0311 12/30/23 0414 12/30/23 2008 12/31/23 0628 01/01/24 0900 01/04/24 0742  HGB  --    < > 11.7* 11.3* 11.6* 11.2* 11.4*  MCV  --    < > 100.8* 98.7 101.3* 101.1* 100.3*  VITAMINB12 776  --   --   --   --   --   --   FOLATE 21.8  --   --   --   --   --   --   FERRITIN 1,491*  --   --   --   --   --   --   TIBC 316  --   --   --   --   --   --   IRON  90  --   --   --   --   --   --  RETICCTPCT 2.7  --   --   --   --   --   --    < > = values in this interval not displayed.    Cardiac Enzymes: No results for input(s): "CKTOTAL", "CKMB", "CKMBINDEX", "TROPONINI" in the last 168 hours. CBG: Recent Labs  Lab 12/30/23 1751 01/01/24 0057  GLUCAP 75 111*    Iron  Studies: No results for input(s): "IRON ", "TIBC", "TRANSFERRIN", "FERRITIN" in the last 72 hours. Studies/Results: DG CHEST PORT 1 VIEW Result Date: 01/03/2024 CLINICAL DATA:  Follow-up pleural effusion EXAM: PORTABLE CHEST 1 VIEW COMPARISON:  Film from earlier in the same day. FINDINGS: Cardiac shadow is stable. Pigtail catheter is again noted on the right. No evidence of significant effusion or pneumothorax is noted. No focal infiltrate is seen. IMPRESSION: No acute abnormality noted. Pigtail catheter remains in place on the right. Electronically Signed   By: Violeta Grey M.D.   On: 01/03/2024 00:18   CT CHEST WO CONTRAST Result Date: 01/02/2024 CLINICAL DATA:  Follow-up right pleural effusion. Status post chest tube placement. EXAM: CT CHEST WITHOUT CONTRAST TECHNIQUE: Multidetector CT imaging of the chest was performed following the standard protocol without IV contrast. RADIATION DOSE REDUCTION: This exam was performed according to the departmental dose-optimization program which includes automated exposure control, adjustment of the mA and/or kV according to patient size and/or use of  iterative reconstruction technique. COMPARISON:  Chest CT 12/30/2023 FINDINGS: Cardiovascular: Stable cardiac enlargement and pericardial effusion. Age advanced vascular calcifications again demonstrated. Mediastinum/Nodes: No mediastinal or hilar mass or overt adenopathy. The esophagus is grossly normal. Lungs/Pleura: Right pleural drainage catheter in place. Small pneumothorax noted. The pleural effusion has been completely evacuated. Significant airspace disease, most notably in the right middle lobe. Although this could be re-expansion edema, infiltrate/pneumonia is also possible. Minimal left basilar atelectasis. No worrisome pulmonary lesions. Upper Abdomen: No significant upper abdominal findings. Advanced vascular disease for age. Musculoskeletal: Significant spinal deformity. IMPRESSION: 1. Right pleural drainage catheter in place with small pneumothorax. The pleural effusion has been completely evacuated. 2. Significant airspace disease, most notably in the right middle lobe. Although this could be re-expansion edema, infiltrate/pneumonia is also possible. 3. Stable cardiac enlargement and pericardial effusion. 4. Age advanced vascular calcifications. Electronically Signed   By: Marrian Siva M.D.   On: 01/02/2024 16:17    Medications: Infusions:   Scheduled Medications:  Chlorhexidine  Gluconate Cloth  6 each Topical Q0600   ferric citrate   420 mg Oral BID WC   heparin   5,000 Units Subcutaneous Q8H   metoprolol  succinate  100 mg Oral Daily   midodrine  5 mg Oral BID WC   multivitamin  1 tablet Oral Daily   pantoprazole   40 mg Oral Daily   sodium zirconium cyclosilicate   10 g Oral Q M,W,F    have reviewed scheduled and prn medications.  Physical Exam: General:NAD, comfortable Heart:RRR, s1s2 nl Lungs:clear b/l, no crackle Abdomen:soft, Non-tender, non-distended Extremities:No edema Dialysis Access: AV fistula.  Laetitia Schnepf Prasad Navina Wohlers 01/04/2024,10:32 AM  LOS: 3 days

## 2024-01-04 NOTE — Discharge Summary (Signed)
 [facility] Physician Discharge Summary   Patient name: [name]  Admit date:     12/30/2023  Discharge date: 01/04/2024  Attending Physician: Farrel Hones Sherrika Weakland [[id number]]  Discharge Physician: Unk Garb   PCP: [facility]  Admitted From: Home  Disposition:  Home  Recommendations for Outpatient Follow-up:  Follow up with PCP in 1-2 weeks Follow up with outpatient pulmonology Please follow up on the following pending results: pleural fluid cultures  Home Health:No Equipment/Devices: None    Discharge Condition:Stable CODE STATUS:FULL Diet recommendation: Renal Fluid Restriction: 1400 ml/day  Hospital Summary: HPI:  [name] is a 33 y.o. female with medical history significant of spina bifida, HTN, ESRD (HD on MWF), and depression p/w SOB iso R pleural effusion.   Pt was in her USOH until this morning at 0300 when she experienced acute onset SOB while sitting up watching television for which pt activated EMS. Pt reports having her last HD session on Monday. Denies any sick contacts or recent medications changes.   In the ED, pt tachycardic and tachypneic on RA. Labs notable for BUN/Cr 42/6.41, BNP 1039, troponin 79-->78, and Hb 11.7.  CTA neg for PE, but did show large R pleural effusion with RLL and RML collapse. Pt admitted to medicine w/ Nephrology following.    Significant Events: Admitted 12/30/2023 for recurrent right pleural effusion   Admission Labs: WBC 7.1, HgB 11.7, plt 132 Na 140, K 4.3, CO2 of 24, BUN 42, scr 6.41, glu 88 TP 6.8, alb 3.0, AST 26, ALT 16, T. Bili 1.2 BNP 1039  Admission Imaging Studies: CXR Progressive right-sided airspace disease and pleural opacification. Most likely represents a loculated pleural effusion Airspace disease is concerning for pneumonia or post-obstructive disease. 2. Stable cardiac enlargement CTPA No pulmonary embolism. 2. Large right pleural effusion with associated right lower lobe collapse and  partial right middle lobe collapse. 3. Diffuse ground-glass attenuation within the aerated lungs, no mass lesion. This likely reflects edema  Significant Labs: Thoracentesis Labs: Lab Results  Component Value Date/Time   FLUIDTYPE PLEURAL FLUID CYTO 01/02/2024 11:40 AM   COLORFL YELLOW (A) 01/02/2024 11:40 AM   APPEARANCEFL HAZY (A) 01/02/2024 11:40 AM   WBCFLUID 579 01/02/2024 11:40 AM   FNEUT 76 (H) 01/02/2024 11:40 AM   LYMPHSFL 11 01/02/2024 11:40 AM   MONOMACSERFL 10 (L) 01/02/2024 11:40 AM   EOSFL 3 01/02/2024 11:40 AM   OTHERCELLSBF FEW PYKNOTIC NEUTROPHILS NOTED. 01/02/2024 11:40 AM   ALBFL 1.5 01/02/2024 11:40 AM   ALBUMIN 2.8 (L) 01/01/2024 09:20 AM   GLUCOSEFL 78 01/02/2024 11:40 AM   TPROTFLUID <3.0 01/02/2024 11:40 AM   PROT 6.8 12/30/2023 04:14 AM   LDHFL 343 (H) 01/02/2024 11:40 AM   SDES PLEURAL 01/01/2024 04:17 PM   SPECREQUEST PLEURAL 01/01/2024 04:17 PM   GRAMSTAIN NO WBC SEEN NO ORGANISMS SEEN  01/01/2024 04:17 PM   CULT  01/01/2024 04:17 PM    NO GROWTH 3 DAYS Performed at [facility], [address] [place], [place] [address]    REPTSTATUS 01/04/2024 FINAL 01/01/2024 04:17 PM   Significant Imaging Studies: 01-02-2024 CT chest Right pleural drainage catheter in place with small pneumothorax. The pleural effusion has been completely evacuated. 2. Significant airspace disease, most notably in the right middle lobe. Although this could be re-expansion edema, infiltrate/pneumonia is also possible. 3. Stable cardiac enlargement and pericardial effusion. 4. Age advanced vascular calcifications  Antibiotic Therapy: Anti-infectives (From admission, onward)    None  Procedures: 01-01-2024 right pigtail chest tube by IR 01-04-2024 chest tube removal  Consultants: Nephrology [facility] Course by Problem: * Recurrent pleural effusion on right 12-31-2023 PCCM consulted. Per PCCM notes, they will ask IR for thoracentesis. Pt states  someone told her that she is not getting thoracentesis. She isn't sure who said that. PCCM recommended extra fluid removal with HD. Nephrology made aware of PCCM recs.  01-01-2024 s/p chest tube placement. Defer to PCCM if she needs intra-pleural lytics. IV dilaudid  ordered x 1 for pain. Po dilaudid  prn q4h prn.  01-02-2024 Has another 400 ml from chest tube since last night.  Total output thus far has been about 1.2 liters(600 ml from initial chest tube insertion) and the 800 ml in her chest atrium thus far. Today's CXR shows lots of improvement in pleural effusion since chest tube placement. Awaiting for PCCM to decide if pt needs intra-pleural lytics today.  01-03-2024 PCCM plans for chest tube removal today. Pt can home go later this evening.  01-04-2024 chest removal delayed due to PCCM APP yesterday evening not qualified to remove chest tube with retaining string. Chest tube removed today. Pleural fluid analysis shows transudate. F/u with PCCM. No concern for pneumonia or empyema. Has never been febrile. No leukocytosis. Only reason pleural effusion was drained was due to increase in size since Dec 2024 and pt's inability to tolerated increase in UF during HD(due to increased fluid removal causing severe muscle cramping).  S/P chest tube placement [facility]) - 01-01-2024-resolved as of 01/04/2024 01-01-2024 placed due to loculated pleural effusion. Management per PCCM.  01-02-2024 Has another 400 ml from chest tube since last night.  Total output thus far has been about 1.2 liters(600 ml from initial chest tube insertion) and the 800 ml in her chest atrium thus far. Today's CXR shows lots of improvement in pleural effusion since chest tube placement. Awaiting for PCCM to decide if pt needs intra-pleural lytics today.  01-03-2024 PCCM plans for chest tube removal today.  01-04-2024 chest tube removed today. Resolved.  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 Wheelchair dependent.  01-01-2024  chronic.  01-02-2024 stable  01-03-2024 stable  01-04-2024 stable.  Spina bifida (HCC) 12-31-2023 chronic.  01-01-2024 chronic.  01-02-2024 stable  01-03-2024 stable  01-04-2024 stable.  Essential hypertension 12-31-2023 on Toprol -XL 100 mg daily.  01-01-2024 stable.  01-02-2024 stable  01-03-2024 bp was low yesterday. Given IVF and po midodrine.  01-04-2024 stable. Stop midodrine. DC To home.  End stage renal disease on dialysis [facility]) 12-31-2023 nephrology consulted for routine HD.  01-01-2024 had HD today(M W F schedule)  01-02-2024 stable. Will give po robaxin  due to muscle cramping.  Pt complaining of severe muscle cramping. She states she gets this when too much fluid is removed with HD   01-03-2024 stable. Continue outpatient HD on M, W, F.    Discharge Diagnoses:  Principal Problem:   Recurrent pleural effusion on right Active Problems:   End stage renal disease on dialysis [facility])   Essential hypertension   Spina bifida [facility])   Wheelchair dependence   Discharge Instructions  Discharge Instructions     Call MD for:  difficulty breathing, headache or visual disturbances   Complete by: As directed    Call MD for:  hives   Complete by: As directed    Call MD for:  persistant dizziness or light-headedness   Complete by: As directed    Call MD for:  persistant nausea and vomiting   Complete by: As  directed    Call MD for:  redness, tenderness, or signs of infection (pain, swelling, redness, odor or green/yellow discharge around incision site)   Complete by: As directed    Call MD for:  severe uncontrolled pain   Complete by: As directed    Call MD for:  temperature >100.4   Complete by: As directed    Diet renal/carb modified with fluid restriction   Complete by: As directed    50 ounces per day fluid restriction   Discharge instructions   Complete by: As directed    1. Follow up with your primary care provider in 1-2 weeks following discharge  from hospital. 2. Continue with routine outpatient hemodialysis on M, W, F as previous schedule   Increase activity slowly   Complete by: As directed       Allergies as of 01/04/2024       Reactions   Ciprofloxacin  Shortness Of Breath, Nausea And Vomiting, Other (See Comments)   HIGH FEVER and oral blisters    Other Anaphylaxis   Revaclear dialzer   Peanut -containing Drug Products Anaphylaxis   Aleve [naproxen Sodium] Other (See Comments)   G.I.Bleed   Ceftriaxone  Other (See Comments)   Blisters in mouth   Coconut (cocos Nucifera) Hives   Influenza Vaccines Nausea And Vomiting, Other (See Comments)   High fever   Tetanus Toxoid, Adsorbed Nausea And Vomiting, Other (See Comments)   HIGH FEVER, also   Tetanus Toxoids Nausea And Vomiting, Other (See Comments)   HIGH FEVER   Latex Itching, Rash        Medication List     STOP taking these medications    meclizine  12.5 MG tablet Commonly known as: ANTIVERT    sodium zirconium cyclosilicate  5 g packet Commonly known as: LOKELMA        TAKE these medications    albuterol  108 (90 Base) MCG/ACT inhaler Commonly known as: VENTOLIN  HFA Inhale 1-2 puffs into the lungs every 6 (six) hours as needed for wheezing or shortness of breath. What changed: how much to take   albuterol  (2.5 MG/3ML) 0.083% nebulizer solution Commonly known as: PROVENTIL  Take 3 mLs (2.5 mg total) by nebulization every 6 (six) hours as needed for wheezing or shortness of breath. What changed: Another medication with the same name was changed. Make sure you understand how and when to take each.   Auryxia  1 GM 210 MG(Fe) tablet Generic drug: ferric citrate  Take 420 mg by mouth 2 (two) times daily with a meal.   Dialyvite  800 0.8 MG Tabs Take 1 tablet by mouth daily.   EPINEPHrine  0.3 mg/0.3 mL Soaj injection Commonly known as: EPI-PEN Inject 0.3 mg into the muscle as needed for anaphylaxis.   metoprolol  succinate 100 MG 24 hr tablet Commonly  known as: TOPROL -XL Take 100 mg by mouth daily.   omeprazole 40 MG capsule Commonly known as: PRILOSEC Take 40 mg by mouth in the morning and at bedtime.   ondansetron  4 MG tablet Commonly known as: ZOFRAN  Take 4 mg by mouth every 8 (eight) hours as needed for nausea or vomiting.        Allergies  Allergen Reactions   Ciprofloxacin  Shortness Of Breath, Nausea And Vomiting and Other (See Comments)    HIGH FEVER and oral blisters    Other Anaphylaxis    Revaclear dialzer   Peanut -Containing Drug Products Anaphylaxis   Aleve [Naproxen Sodium] Other (See Comments)    G.I.Bleed   Ceftriaxone  Other (See Comments)    Blisters  in mouth    Coconut (Cocos Nucifera) Hives   Influenza Vaccines Nausea And Vomiting and Other (See Comments)    High fever   Tetanus Toxoid, Adsorbed Nausea And Vomiting and Other (See Comments)    HIGH FEVER, also   Tetanus Toxoids Nausea And Vomiting and Other (See Comments)    HIGH FEVER   Latex Itching and Rash    Discharge Exam: Vitals:   01/04/24 1253 01/04/24 1322  BP: (!) 104/54 113/74  Pulse: 86 83  Resp: (!) 23 16  Temp:  97.9 F (36.6 C)  SpO2: 97% 100%    Physical Exam Vitals and nursing note reviewed.  HENT:     Head: Normocephalic.  Eyes:     General: No scleral icterus. Pulmonary:     Effort: No respiratory distress.  Abdominal:     General: There is no distension.  Neurological:     Mental Status: She is alert and oriented to person, place, and time.     The results of significant diagnostics from this hospitalization (including imaging, microbiology, ancillary and laboratory) are listed below for reference.    Microbiology: Recent Results (from the past 240 hours)  MRSA Next Gen by PCR, Nasal     Status: None   Collection Time: 12/30/23  8:53 PM   Specimen: Nasal Mucosa; Nasal Swab  Result Value Ref Range Status   MRSA by PCR Next Gen NOT DETECTED NOT DETECTED Final    Comment: (NOTE) The GeneXpert MRSA Assay  (FDA approved for NASAL specimens only), is one component of a comprehensive MRSA colonization surveillance program. It is not intended to diagnose MRSA infection nor to guide or monitor treatment for MRSA infections. Test performance is not FDA approved in patients less than 31 years old. Performed at [facility], [address], [place], [place] [address]   Body fluid culture w Gram Stain     Status: None   Collection Time: 01/01/24  4:17 PM   Specimen: Pleura  Result Value Ref Range Status   Specimen Description PLEURAL  Final   Special Requests PLEURAL  Final   Gram Stain NO WBC SEEN NO ORGANISMS SEEN   Final   Culture   Final    NO GROWTH 3 DAYS Performed at [facility], [address] [place], [place] [address]    Report Status 01/04/2024 FINAL  Final     Labs: BNP (last 3 results) Recent Labs    07/15/23 0405 07/23/23 2000 12/30/23 0414  BNP 1,979.9* 2,478.9* 1,039.6*   Basic Metabolic Panel: Recent Labs  Lab 12/30/23 0414 12/30/23 2008 12/31/23 0628 01/01/24 0920 01/04/24 0419  NA 140  --  138 139 136  K 4.3  --  4.0 4.3 4.5  CL 97*  --  99 100 96*  CO2 24  --  26 25 25   GLUCOSE 88  --  82 122* 100*  BUN 42*  --  27* 40* 49*  CREATININE 6.41* 3.13* 4.60* 6.45* 7.81*  CALCIUM  7.7*  --  8.5* 8.0* 7.1*  PHOS  --   --   --  6.5*  --    Liver Function Tests: Recent Labs  Lab 12/30/23 0414 01/01/24 0920  AST 26  --   ALT 16  --   ALKPHOS 114  --   BILITOT 1.2  --   PROT 6.8  --   ALBUMIN 3.0* 2.8*   CBC: Recent Labs  Lab 12/30/23 0414 12/30/23 2008 12/31/23 1308  01/01/24 0900 01/04/24 0742  WBC 7.1 5.4 5.8 5.3 6.6  NEUTROABS  --   --   --   --  4.4  HGB 11.7* 11.3* 11.6* 11.2* 11.4*  HCT 39.1 36.9 38.4 36.5 37.1  MCV 100.8* 98.7 101.3* 101.1* 100.3*  PLT 132* 125* 123* 132* 170   BNP: Recent Labs  Lab 12/30/23 0414  BNP 1,039.6*   CBG: Recent Labs  Lab 12/30/23 1751 01/01/24 0057  GLUCAP 75 111*    Thoracentesis Labs: Lab Results  Component Value Date/Time   FLUIDTYPE PLEURAL FLUID CYTO 01/02/2024 11:40 AM   COLORFL YELLOW (A) 01/02/2024 11:40 AM   APPEARANCEFL HAZY (A) 01/02/2024 11:40 AM   WBCFLUID 579 01/02/2024 11:40 AM   FNEUT 76 (H) 01/02/2024 11:40 AM   LYMPHSFL 11 01/02/2024 11:40 AM   MONOMACSERFL 10 (L) 01/02/2024 11:40 AM   EOSFL 3 01/02/2024 11:40 AM   OTHERCELLSBF FEW PYKNOTIC NEUTROPHILS NOTED. 01/02/2024 11:40 AM   ALBFL 1.5 01/02/2024 11:40 AM   ALBUMIN 2.8 (L) 01/01/2024 09:20 AM   GLUCOSEFL 78 01/02/2024 11:40 AM   TPROTFLUID <3.0 01/02/2024 11:40 AM   PROT 6.8 12/30/2023 04:14 AM   LDHFL 343 (H) 01/02/2024 11:40 AM   SDES PLEURAL 01/01/2024 04:17 PM   SPECREQUEST PLEURAL 01/01/2024 04:17 PM   GRAMSTAIN NO WBC SEEN NO ORGANISMS SEEN  01/01/2024 04:17 PM   CULT  01/01/2024 04:17 PM    NO GROWTH 3 DAYS Performed at [facility], [address], [place], [place] [address]    REPTSTATUS 01/04/2024 FINAL 01/01/2024 04:17 PM    Sepsis Labs Recent Labs  Lab 12/30/23 2008 12/31/23 0628 01/01/24 0900 01/04/24 0742  PROCALCITON 0.96  --   --   --   WBC 5.4 5.8 5.3 6.6    Procedures/Studies: DG CHEST PORT 1 VIEW Result Date: 01/03/2024 CLINICAL DATA:  Follow-up pleural effusion EXAM: PORTABLE CHEST 1 VIEW COMPARISON:  Film from earlier in the same day. FINDINGS: Cardiac shadow is stable. Pigtail catheter is again noted on the right. No evidence of significant effusion or pneumothorax is noted. No focal infiltrate is seen. IMPRESSION: No acute abnormality noted. Pigtail catheter remains in place on the right. Electronically Signed   By: Violeta Grey M.D.   On: 01/03/2024 00:18   CT CHEST WO CONTRAST Result Date: 01/02/2024 CLINICAL DATA:  Follow-up right pleural effusion. Status post chest tube placement. EXAM: CT CHEST WITHOUT CONTRAST TECHNIQUE: Multidetector CT imaging of the chest was performed following the standard protocol without IV contrast.  RADIATION DOSE REDUCTION: This exam was performed according to the departmental dose-optimization program which includes automated exposure control, adjustment of the mA and/or kV according to patient size and/or use of iterative reconstruction technique. COMPARISON:  Chest CT 12/30/2023 FINDINGS: Cardiovascular: Stable cardiac enlargement and pericardial effusion. Age advanced vascular calcifications again demonstrated. Mediastinum/Nodes: No mediastinal or hilar mass or overt adenopathy. The esophagus is grossly normal. Lungs/Pleura: Right pleural drainage catheter in place. Small pneumothorax noted. The pleural effusion has been completely evacuated. Significant airspace disease, most notably in the right middle lobe. Although this could be re-expansion edema, infiltrate/pneumonia is also possible. Minimal left basilar atelectasis. No worrisome pulmonary lesions. Upper Abdomen: No significant upper abdominal findings. Advanced vascular disease for age. Musculoskeletal: Significant spinal deformity. IMPRESSION: 1. Right pleural drainage catheter in place with small pneumothorax. The pleural effusion has been completely evacuated. 2. Significant airspace disease, most notably in the right middle lobe. Although this could be re-expansion edema, infiltrate/pneumonia is  also possible. 3. Stable cardiac enlargement and pericardial effusion. 4. Age advanced vascular calcifications. Electronically Signed   By: Marrian Siva M.D.   On: 01/02/2024 16:17   DG CHEST PORT 1 VIEW Result Date: 01/02/2024 CLINICAL DATA:  Pleural effusion. EXAM: PORTABLE CHEST 1 VIEW COMPARISON:  Chest radiograph dated 12/30/2023. FINDINGS: Right-sided chest tube with tip over the right upper lung. Significant decrease with near complete resolution of right pleural effusion. There is re-expansion of the right lung. No pneumothorax. Mild cardiomegaly. No acute osseous pathology. IMPRESSION: Right-sided chest tube with near complete resolution of  right pleural effusion. Electronically Signed   By: Angus Bark M.D.   On: 01/02/2024 10:05   IR PERC PLEURAL DRAIN W/INDWELL CATH W/IMG GUIDE Result Date: 01/01/2024 INDICATION: 33 year old female with complicated right anterior pleural effusion. She presents for placement of a pigtail chest tube. EXAM: Ultrasound and fluoroscopy guided pigtail chest tube placement MEDICATIONS: The patient is currently admitted to the hospital and receiving intravenous antibiotics. The antibiotics were administered within an appropriate time frame prior to the initiation of the procedure. ANESTHESIA/SEDATION: None. COMPLICATIONS: None immediate. PROCEDURE: Informed written consent was obtained from the patient after a thorough discussion of the procedural risks, benefits and alternatives. All questions were addressed. Maximal Sterile Barrier Technique was utilized including caps, mask, sterile gowns, sterile gloves, sterile drape, hand hygiene and skin antiseptic. A timeout was performed prior to the initiation of the procedure. The right chest was interrogated with ultrasound. The complex parole effusion is identified. A suitable skin entry site was selected and marked. Local anesthesia was attained by infiltration with 1% lidocaine . A small dermatotomy was made. Under real-time ultrasound guidance, an 18 gauge trocar needle was carefully advanced over a rib and into the pleural space. A 0.035 wire was advanced into the pleural space. The needle was removed. The tract was dilated to 10 [place]. A skater 10 [place] all-purpose drainage catheter was advanced over the wire and formed. Aspiration was then performed yielding 600 mL of bloody pleural fluid. The catheter was then connected to an atrium and secured to the skin with 0 Prolene suture. Bandages were applied. IMPRESSION: Successful placement of a 10 French percutaneous pigtail chest tube into the complex right pleural effusion. Successful evacuation of 600 mL bloody  pleural fluid. A sample was sent for culture. Electronically Signed   By: Fernando Hoyer M.D.   On: 01/01/2024 16:21   CT Angio Chest PE W and/or Wo Contrast Result Date: 12/30/2023 EXAM: CTA of the Chest with contrast for PE 12/30/2023 07:24:38 AM TECHNIQUE: CTA of the chest was performed after the administration of intravenous contrast. Multiplanar reformatted images are provided for review. MIP images are provided for review. Automated exposure control, iterative reconstruction, and/or weight based adjustment of the mA/kV was utilized to reduce the radiation dose to as low as reasonably achievable. COMPARISON: CT angio chest 03/31/2020 CLINICAL HISTORY: Pulmonary embolism (PE) suspected, high prob. female with history of spina bifida, CHF, pulmonary hypertension, mitral stenosis, bilateral AKAs, ESRD on hemodialysis Monday Wednesday Friday. She presents to the ER today for evaluation for shortness of breath that started several hours ago. She states she feels like she has too much fluid and needs dialysis early. FINDINGS: PULMONARY ARTERIES: Pulmonary arteries are adequately opacified for evaluation. No pulmonary embolism. Main pulmonary artery is normal in caliber. MEDIASTINUM: Heart is mildly enlarged. There is no acute abnormality of the thoracic aorta. LYMPH NODES: No mediastinal, hilar or axillary lymphadenopathy. LUNGS AND PLEURA: A large right  pleural effusion is present. The right lower lobe is mostly collapsed. Partial collapse of the right middle lobe is present. Diffuse ground-glass attenuation is present within the aerated lungs. No mass lesion is present. UPPER ABDOMEN: Abdominal ascites is again noted. Heterogeneous appearance of the pancreas is stable. SOFT TISSUES AND BONES: Caudal regression again noted. No acute bone or soft tissue abnormality. IMPRESSION: 1. No pulmonary embolism. 2. Large right pleural effusion with associated right lower lobe collapse and partial right middle lobe  collapse. 3. Diffuse ground-glass attenuation within the aerated lungs, no mass lesion. This likely reflects edema. Electronically signed by: Audree Leas MD 12/30/2023 07:33 AM EDT RP Workstation: [id number]   DG Chest Port 1 View Result Date: 12/30/2023 EXAM: 1 VIEW XRAY OF THE CHEST 12/30/2023 04:30:00 AM COMPARISON: 1 view chest x-ray 11/26/2023. CLINICAL HISTORY: SOB. Pt BIB [facility] from home. Pt had sudden onset of SOB 2 hrs ago. Originally 88% on RA, 4L [place] applied to pt and O2 raised to 96%. Denies CP; Pt is MWF dialysis, pt reports receiving full treatment on Monday. FINDINGS: LUNGS AND PLEURA: Progressive right-sided airspace disease and pleural opacification is present. The left hemithorax is clear. HEART AND MEDIASTINUM: Cardiac enlargement is stable. BONES AND SOFT TISSUES: No acute osseous abnormality. IMPRESSION: 1. Progressive right-sided airspace disease and pleural opacification. Most likely represents a loculated pleural effusion Airspace disease is concerning for pneumonia or post-obstructive disease. 2. Stable cardiac enlargement. Electronically signed by: Audree Leas MD 12/30/2023 04:47 AM EDT RP Workstation: [id number]   Sleep Study Documents Result Date: 12/09/2023 Ordered by an unspecified provider.   Time coordinating discharge: 50 mins  SIGNED:  Unk Garb, DO [facility] 01/04/24, 3:15 PM

## 2024-01-04 NOTE — Progress Notes (Signed)
 PROGRESS NOTE    [name]  GLO:[id number] DOB: 11/29/1990 DOA: 12/30/2023 PCP: [facility]  Subjective: Pt seen and examined. Chest tube removed today by IR. Pt and husband requesting to go home.   Hospital Course: HPI:  [name] is a 33 y.o. female with medical history significant of spina bifida, HTN, ESRD (HD on MWF), and depression p/w SOB iso R pleural effusion.   Pt was in her USOH until this morning at 0300 when she experienced acute onset SOB while sitting up watching television for which pt activated EMS. Pt reports having her last HD session on Monday. Denies any sick contacts or recent medications changes.   In the ED, pt tachycardic and tachypneic on RA. Labs notable for BUN/Cr 42/6.41, BNP 1039, troponin 79-->78, and Hb 11.7.  CTA neg for PE, but did show large R pleural effusion with RLL and RML collapse. Pt admitted to medicine w/ Nephrology following.    Significant Events: Admitted 12/30/2023 for recurrent right pleural effusion   Admission Labs: WBC 7.1, HgB 11.7, plt 132 Na 140, K 4.3, CO2 of 24, BUN 42, scr 6.41, glu 88 TP 6.8, alb 3.0, AST 26, ALT 16, T. Bili 1.2 BNP 1039  Admission Imaging Studies: CXR Progressive right-sided airspace disease and pleural opacification. Most likely represents a loculated pleural effusion Airspace disease is concerning for pneumonia or post-obstructive disease. 2. Stable cardiac enlargement CTPA No pulmonary embolism. 2. Large right pleural effusion with associated right lower lobe collapse and partial right middle lobe collapse. 3. Diffuse ground-glass attenuation within the aerated lungs, no mass lesion. This likely reflects edema  Significant Labs: Thoracentesis Labs: Lab Results  Component Value Date/Time   FLUIDTYPE PLEURAL FLUID CYTO 01/02/2024 11:40 AM   COLORFL YELLOW (A) 01/02/2024 11:40 AM   APPEARANCEFL HAZY (A) 01/02/2024 11:40 AM   WBCFLUID 579 01/02/2024 11:40 AM   FNEUT 76 (H)  01/02/2024 11:40 AM   LYMPHSFL 11 01/02/2024 11:40 AM   MONOMACSERFL 10 (L) 01/02/2024 11:40 AM   EOSFL 3 01/02/2024 11:40 AM   OTHERCELLSBF FEW PYKNOTIC NEUTROPHILS NOTED. 01/02/2024 11:40 AM   ALBFL 1.5 01/02/2024 11:40 AM   ALBUMIN 2.8 (L) 01/01/2024 09:20 AM   GLUCOSEFL 78 01/02/2024 11:40 AM   TPROTFLUID <3.0 01/02/2024 11:40 AM   PROT 6.8 12/30/2023 04:14 AM   LDHFL 343 (H) 01/02/2024 11:40 AM   SDES PLEURAL 01/01/2024 04:17 PM   SPECREQUEST PLEURAL 01/01/2024 04:17 PM   GRAMSTAIN NO WBC SEEN NO ORGANISMS SEEN  01/01/2024 04:17 PM   CULT  01/01/2024 04:17 PM    NO GROWTH 3 DAYS Performed at [facility], [address], [place], [place] [address]    REPTSTATUS 01/04/2024 FINAL 01/01/2024 04:17 PM   Significant Imaging Studies: 01-02-2024 CT chest Right pleural drainage catheter in place with small pneumothorax. The pleural effusion has been completely evacuated. 2. Significant airspace disease, most notably in the right middle lobe. Although this could be re-expansion edema, infiltrate/pneumonia is also possible. 3. Stable cardiac enlargement and pericardial effusion. 4. Age advanced vascular calcifications  Antibiotic Therapy: Anti-infectives (From admission, onward)    None       Procedures: 01-01-2024 right pigtail chest tube by IR 01-04-2024 chest tube removal  Consultants: Nephrology PCCM    Assessment and Plan: * Recurrent pleural effusion on right 12-31-2023 PCCM consulted. Per PCCM notes, they will ask IR for thoracentesis. Pt states someone told her that she is not getting thoracentesis. She isn't sure who said that.  PCCM recommended extra fluid removal with HD. Nephrology made aware of PCCM recs.  01-01-2024 s/p chest tube placement. Defer to PCCM if she needs intra-pleural lytics. IV dilaudid  ordered x 1 for pain. Po dilaudid  prn q4h prn.  01-02-2024 Has another 400 ml from chest tube since last night.  Total output thus far has been about 1.2  liters(600 ml from initial chest tube insertion) and the 800 ml in her chest atrium thus far. Today's CXR shows lots of improvement in pleural effusion since chest tube placement. Awaiting for PCCM to decide if pt needs intra-pleural lytics today.  01-03-2024 PCCM plans for chest tube removal today. Pt can home go later this evening.  01-04-2024 chest removal delayed due to PCCM APP yesterday evening not qualified to remove chest tube with retaining string. Chest tube removed today. Pleural fluid analysis shows transudate. F/u with PCCM. No concern for pneumonia or empyema. Has never been febrile. No leukocytosis. Only reason pleural effusion was drained was due to increase in size since Dec 2024 and pt's inability to tolerated increase in UF during HD(due to increased fluid removal causing severe muscle cramping).  S/P chest tube placement [facility]) - 01-01-2024-resolved as of 01/04/2024 01-01-2024 placed due to loculated pleural effusion. Management per PCCM.  01-02-2024 Has another 400 ml from chest tube since last night.  Total output thus far has been about 1.2 liters(600 ml from initial chest tube insertion) and the 800 ml in her chest atrium thus far. Today's CXR shows lots of improvement in pleural effusion since chest tube placement. Awaiting for PCCM to decide if pt needs intra-pleural lytics today.  01-03-2024 PCCM plans for chest tube removal today.  01-04-2024 chest tube removed today. Resolved.  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 Wheelchair dependent.  01-01-2024 chronic.  01-02-2024 stable  01-03-2024 stable  01-04-2024 stable.  Spina bifida (HCC) 12-31-2023 chronic.  01-01-2024 chronic.  01-02-2024 stable  01-03-2024 stable  01-04-2024 stable.  Essential hypertension 12-31-2023 on Toprol -XL 100 mg daily.  01-01-2024 stable.  01-02-2024 stable  01-03-2024 bp was low yesterday. Given IVF and po midodrine.  01-04-2024 stable. Stop midodrine. DC To  home.  End stage renal disease on dialysis [facility]) 12-31-2023 nephrology consulted for routine HD.  01-01-2024 had HD today(M W F schedule)  01-02-2024 stable. Will give po robaxin  due to muscle cramping.  Pt complaining of severe muscle cramping. She states she gets this when too much fluid is removed with HD   01-03-2024 stable. Continue outpatient HD on M, W, F.      DVT prophylaxis: heparin  injection 5,000 Units Start: 01/02/24 0600 Place and maintain sequential compression device Start: 12/31/23 0027     Code Status: Full Code Family Communication: discussed with pt and husband nelson at bedside Disposition Plan: return home Reason for continuing need for hospitalization: stable for DC today.  Objective: Vitals:   01/04/24 1138 01/04/24 1210 01/04/24 1253 01/04/24 1322  BP: (!) 106/52 (!) 104/52 (!) 104/54 113/74  Pulse: 66 81 86 83  Resp: 16 16 (!) 23 16  Temp:    97.9 F (36.6 C)  TempSrc:      SpO2: 99% 100% 97% 100%  Weight:    (S) 32 kg  Height:        Intake/Output Summary (Last 24 hours) at 01/04/2024 1514 Last data filed at 01/04/2024 1322 Gross per 24 hour  Intake --  Output 1600 ml  Net -1600 ml   Filed Weights   01/03/24 0419 01/04/24 0830 01/04/24 1322  Weight: 32 kg 34.9 kg (S) 32 kg    Examination:  Physical Exam Vitals and nursing note reviewed.  HENT:     Head: Normocephalic.  Eyes:     General: No scleral icterus. Pulmonary:     Effort: No respiratory distress.  Abdominal:     General: There is no distension.  Neurological:     Mental Status: She is alert and oriented to person, place, and time.     Data Reviewed: I have personally reviewed following labs and imaging studies  CBC: Recent Labs  Lab 12/30/23 0414 12/30/23 2008 12/31/23 0628 01/01/24 0900 01/04/24 0742  WBC 7.1 5.4 5.8 5.3 6.6  NEUTROABS  --   --   --   --  4.4  HGB 11.7* 11.3* 11.6* 11.2* 11.4*  HCT 39.1 36.9 38.4 36.5 37.1  MCV 100.8* 98.7 101.3* 101.1*  100.3*  PLT 132* 125* 123* 132* 170   Basic Metabolic Panel: Recent Labs  Lab 12/30/23 0414 12/30/23 2008 12/31/23 0628 01/01/24 0920 01/04/24 0419  NA 140  --  138 139 136  K 4.3  --  4.0 4.3 4.5  CL 97*  --  99 100 96*  CO2 24  --  26 25 25   GLUCOSE 88  --  82 122* 100*  BUN 42*  --  27* 40* 49*  CREATININE 6.41* 3.13* 4.60* 6.45* 7.81*  CALCIUM  7.7*  --  8.5* 8.0* 7.1*  PHOS  --   --   --  6.5*  --    GFR: Estimated Creatinine Clearance: 1.4 mL/min (A) (by C-G formula based on SCr of 7.81 mg/dL (H)). Liver Function Tests: Recent Labs  Lab 12/30/23 0414 01/01/24 0920  AST 26  --   ALT 16  --   ALKPHOS 114  --   BILITOT 1.2  --   PROT 6.8  --   ALBUMIN 3.0* 2.8*   BNP (last 3 results) Recent Labs    07/15/23 0405 07/23/23 2000 12/30/23 0414  BNP 1,979.9* 2,478.9* 1,039.6*   CBG: Recent Labs  Lab 12/30/23 1751 01/01/24 0057  GLUCAP 75 111*   Sepsis Labs: Recent Labs  Lab 12/30/23 2008  PROCALCITON 0.96    Recent Results (from the past 240 hours)  MRSA Next Gen by PCR, Nasal     Status: None   Collection Time: 12/30/23  8:53 PM   Specimen: Nasal Mucosa; Nasal Swab  Result Value Ref Range Status   MRSA by PCR Next Gen NOT DETECTED NOT DETECTED Final    Comment: (NOTE) The GeneXpert MRSA Assay (FDA approved for NASAL specimens only), is one component of a comprehensive MRSA colonization surveillance program. It is not intended to diagnose MRSA infection nor to guide or monitor treatment for MRSA infections. Test performance is not FDA approved in patients less than 32 years old. Performed at [facility], [address] [place], [place] [address]   Body fluid culture w Gram Stain     Status: None   Collection Time: 01/01/24  4:17 PM   Specimen: Pleura  Result Value Ref Range Status   Specimen Description PLEURAL  Final   Special Requests PLEURAL  Final   Gram Stain NO WBC SEEN NO ORGANISMS SEEN   Final   Culture   Final    NO  GROWTH 3 DAYS Performed at [facility], [address] [place], [place] [address]    Report Status 01/04/2024 FINAL  Final     Radiology  Studies: DG CHEST PORT 1 VIEW Result Date: 01/03/2024 CLINICAL DATA:  Follow-up pleural effusion EXAM: PORTABLE CHEST 1 VIEW COMPARISON:  Film from earlier in the same day. FINDINGS: Cardiac shadow is stable. Pigtail catheter is again noted on the right. No evidence of significant effusion or pneumothorax is noted. No focal infiltrate is seen. IMPRESSION: No acute abnormality noted. Pigtail catheter remains in place on the right. Electronically Signed   By: Violeta Grey M.D.   On: 01/03/2024 00:18   CT CHEST WO CONTRAST Result Date: 01/02/2024 CLINICAL DATA:  Follow-up right pleural effusion. Status post chest tube placement. EXAM: CT CHEST WITHOUT CONTRAST TECHNIQUE: Multidetector CT imaging of the chest was performed following the standard protocol without IV contrast. RADIATION DOSE REDUCTION: This exam was performed according to the departmental dose-optimization program which includes automated exposure control, adjustment of the mA and/or kV according to patient size and/or use of iterative reconstruction technique. COMPARISON:  Chest CT 12/30/2023 FINDINGS: Cardiovascular: Stable cardiac enlargement and pericardial effusion. Age advanced vascular calcifications again demonstrated. Mediastinum/Nodes: No mediastinal or hilar mass or overt adenopathy. The esophagus is grossly normal. Lungs/Pleura: Right pleural drainage catheter in place. Small pneumothorax noted. The pleural effusion has been completely evacuated. Significant airspace disease, most notably in the right middle lobe. Although this could be re-expansion edema, infiltrate/pneumonia is also possible. Minimal left basilar atelectasis. No worrisome pulmonary lesions. Upper Abdomen: No significant upper abdominal findings. Advanced vascular disease for age. Musculoskeletal: Significant spinal  deformity. IMPRESSION: 1. Right pleural drainage catheter in place with small pneumothorax. The pleural effusion has been completely evacuated. 2. Significant airspace disease, most notably in the right middle lobe. Although this could be re-expansion edema, infiltrate/pneumonia is also possible. 3. Stable cardiac enlargement and pericardial effusion. 4. Age advanced vascular calcifications. Electronically Signed   By: Marrian Siva M.D.   On: 01/02/2024 16:17    Scheduled Meds:  Chlorhexidine  Gluconate Cloth  6 each Topical Q0600   ferric citrate   420 mg Oral BID WC   heparin   5,000 Units Subcutaneous Q8H   [START ON 01/05/2024] metoprolol  succinate  50 mg Oral Daily   multivitamin  1 tablet Oral Daily   pantoprazole   40 mg Oral Daily   Continuous Infusions:   LOS: 3 days   Time spent: 50 minutes  Unk Garb, DO  [facility]  01/04/2024, 3:14 PM

## 2024-01-04 NOTE — Progress Notes (Signed)
   01/04/24 1322  Vitals  Temp 97.9 F (36.6 C)  Pulse Rate 83  Resp 16  BP 113/74  SpO2 100 %  O2 Device Room Air  Weight (S)  32 kg (Bed Scale)  Type of Weight Post-Dialysis  Post Treatment  Dialyzer Clearance Clear  Hemodialysis Intake (mL) 0 mL  Liters Processed 62.2  Fluid Removed (mL) 1500 mL  Tolerated HD Treatment Yes  Post-Hemodialysis Comments Pt. tolerated HD procedure well. UF goal met and Admin medication per order. Report call to 2W bedside RN  AVG/AVF Arterial Site Held (minutes) 5 minutes  AVG/AVF Venous Site Held (minutes) 5 minutes   Received patient in bed to unit.  Alert and oriented.  Informed consent signed and in chart.   TX duration:3.5  Patient tolerated well.  Transported back to the room  Alert, without acute distress.  Hand-off given to patient's nurse.   Access used: Yes Access issues: No   Total UF removed: 1500 Medication(s) See MAR Post HD VS: See Above Grid Post HD weight: 32 kg   [facility]

## 2024-01-04 NOTE — Procedures (Signed)
      Interventional radiology was consulted for right chest tube removal.  Chest tube was subsequently removed without difficulty 01/04/24.    Wound was dressed with Vaseline gauze and Tegaderm.    Thank you for the opportunity to participate in [name]'s care.    Electronically Signed: Pasty Bongo, PA-C 09/21/2023, 9:30 AM

## 2024-01-04 NOTE — TOC Transition Note (Signed)
 Transition of Care [facility]) - Discharge Note   Patient Details  Name: [name] MRN: [id number] Date of Birth: 1991-02-25  Transition of Care [facility]) CM/SW Contact:  Jeani Mill, RN Phone Number: 01/04/2024, 4:17 PM   Clinical Narrative:    Patient stable for discharge.  Spoke to patient to confirm address and that husband is at home.  PTAR called. No other TOC needs at this time.   Final next level of care: Home/Self Care Barriers to Discharge: Barriers Resolved   Patient Goals and CMS Choice Patient states their goals for this hospitalization and ongoing recovery are:: Return home          Discharge Placement               Home        Discharge Plan and Services Additional resources added to the After Visit Summary for                                       Social Drivers of Health (SDOH) Interventions SDOH Screenings   Food Insecurity: No Food Insecurity (12/30/2023)  Housing: Low Risk  (12/30/2023)  Transportation Needs: No Transportation Needs (12/30/2023)  Utilities: Not At Risk (12/30/2023)  Depression (PHQ2-9): High Risk (08/20/2023)  Social Connections: Unknown (11/26/2021)   Received from [facility]  Tobacco Use: High Risk (01/01/2024)     Readmission Risk Interventions    12/31/2023   12:14 PM  Readmission Risk Prevention Plan  Transportation Screening Complete  PCP or Specialist Appt within 3-5 Days Complete  HRI or Home Care Consult Complete  Social Work Consult for Recovery Care Planning/Counseling Complete  Palliative Care Screening Complete  Medication Review [employer]) Referral to Pharmacy

## 2024-01-05 ENCOUNTER — Telehealth: Payer: Self-pay | Admitting: Nurse Practitioner

## 2024-01-05 ENCOUNTER — Encounter (HOSPITAL_BASED_OUTPATIENT_CLINIC_OR_DEPARTMENT_OTHER): Admitting: Cardiology

## 2024-01-05 LAB — CYTOLOGY - NON PAP

## 2024-01-05 NOTE — Discharge Planning (Signed)
 [facility] Patient Discharge Orders- [facility] CLINIC: [facility]  Patient's name: [name] Admit/DC Dates: 12/30/2023 - 01/04/2024  Discharge Diagnoses: Recurrent Pleural effuson     Aranesp : Given: No   Date and amount of last dose: NA   PRBC's Given: No Date/# of units: NA Last Hgb: 11.4 ESA dose for discharge: mircera per protocol IV Iron  dose at discharge: Continue current dose per protocol   Heparin  change: No  EDW Change: Yes New EDW: 32 kg  [place]ath Change: No  Access intervention/Change: NA Details:  Hectorol /Calcitriol change: Yes increase to 3.0 mcg PO three times per week for borderline low Ca+ on sensipar .  Discharge Labs: Calcium  7.1 C Ca+ 8.1 Phosphorus 6.5 Albumin 2.8 K+ 4.5  IV Antibiotics: No Details:  On Coumadin?: No Last INR: Next INR: Managed By:   OTHER/APPTS/LAB ORDERS:    D/C Meds to be reconciled by nurse after every discharge.  Completed By: Jacobo Masters [facility] [phone]    Reviewed by: MD:______ RN_______

## 2024-01-05 NOTE — Telephone Encounter (Signed)
 Transition of Care - Initial Contact after Hospitalization  Date of discharge:01/04/2024   Date of contact: 01/05/24  Method: Phone Spoke to: Patient  Patient contacted to discuss transition of care from recent inpatient hospitalization. Patient was admitted to [facility] from 06/04 to 01/04/2024 with discharge diagnosis of: Recurrent pleural effusion requiring chest tube placement.    The discharge medication list was reviewed.Patient says her BP is low-105/75 and she feels dizzy. She has held metoprolol . Educated patient to increase PO fluids. If BP still low and she is symptomatic, she has been educated to return to ED.   Patient will return to his/her outpatient HD unit on: 01/06/2024

## 2024-01-06 DIAGNOSIS — D696 Thrombocytopenia, unspecified: Secondary | ICD-10-CM | POA: Diagnosis not present

## 2024-01-06 DIAGNOSIS — E8770 Fluid overload, unspecified: Secondary | ICD-10-CM | POA: Diagnosis not present

## 2024-01-06 DIAGNOSIS — Z993 Dependence on wheelchair: Secondary | ICD-10-CM | POA: Diagnosis not present

## 2024-01-06 DIAGNOSIS — N2581 Secondary hyperparathyroidism of renal origin: Secondary | ICD-10-CM | POA: Diagnosis not present

## 2024-01-06 DIAGNOSIS — I132 Hypertensive heart and chronic kidney disease with heart failure and with stage 5 chronic kidney disease, or end stage renal disease: Secondary | ICD-10-CM | POA: Diagnosis not present

## 2024-01-06 DIAGNOSIS — I5032 Chronic diastolic (congestive) heart failure: Secondary | ICD-10-CM | POA: Diagnosis not present

## 2024-01-06 DIAGNOSIS — D631 Anemia in chronic kidney disease: Secondary | ICD-10-CM | POA: Diagnosis not present

## 2024-01-06 DIAGNOSIS — I272 Pulmonary hypertension, unspecified: Secondary | ICD-10-CM | POA: Diagnosis not present

## 2024-01-06 DIAGNOSIS — Z992 Dependence on renal dialysis: Secondary | ICD-10-CM | POA: Diagnosis not present

## 2024-01-06 DIAGNOSIS — N186 End stage renal disease: Secondary | ICD-10-CM | POA: Diagnosis not present

## 2024-01-08 DIAGNOSIS — E8770 Fluid overload, unspecified: Secondary | ICD-10-CM | POA: Diagnosis not present

## 2024-01-08 DIAGNOSIS — D631 Anemia in chronic kidney disease: Secondary | ICD-10-CM | POA: Diagnosis not present

## 2024-01-08 DIAGNOSIS — Z992 Dependence on renal dialysis: Secondary | ICD-10-CM | POA: Diagnosis not present

## 2024-01-08 DIAGNOSIS — J90 Pleural effusion, not elsewhere classified: Secondary | ICD-10-CM | POA: Diagnosis not present

## 2024-01-08 DIAGNOSIS — N186 End stage renal disease: Secondary | ICD-10-CM | POA: Diagnosis not present

## 2024-01-08 DIAGNOSIS — I120 Hypertensive chronic kidney disease with stage 5 chronic kidney disease or end stage renal disease: Secondary | ICD-10-CM | POA: Diagnosis not present

## 2024-01-08 DIAGNOSIS — N2581 Secondary hyperparathyroidism of renal origin: Secondary | ICD-10-CM | POA: Diagnosis not present

## 2024-01-11 DIAGNOSIS — N186 End stage renal disease: Secondary | ICD-10-CM | POA: Diagnosis not present

## 2024-01-11 DIAGNOSIS — D631 Anemia in chronic kidney disease: Secondary | ICD-10-CM | POA: Diagnosis not present

## 2024-01-11 DIAGNOSIS — Z992 Dependence on renal dialysis: Secondary | ICD-10-CM | POA: Diagnosis not present

## 2024-01-11 DIAGNOSIS — N2581 Secondary hyperparathyroidism of renal origin: Secondary | ICD-10-CM | POA: Diagnosis not present

## 2024-01-11 DIAGNOSIS — E8770 Fluid overload, unspecified: Secondary | ICD-10-CM | POA: Diagnosis not present

## 2024-01-12 ENCOUNTER — Other Ambulatory Visit: Payer: Self-pay

## 2024-01-12 ENCOUNTER — Emergency Department (HOSPITAL_COMMUNITY)
Admission: EM | Admit: 2024-01-12 | Discharge: 2024-01-12 | Disposition: A | Discharge status: Home / Self Care | Attending: Emergency Medicine | Admitting: Emergency Medicine

## 2024-01-12 ENCOUNTER — Encounter (HOSPITAL_COMMUNITY): Payer: Self-pay

## 2024-01-12 DIAGNOSIS — N186 End stage renal disease: Secondary | ICD-10-CM | POA: Insufficient documentation

## 2024-01-12 DIAGNOSIS — Z720 Tobacco use: Secondary | ICD-10-CM | POA: Insufficient documentation

## 2024-01-12 DIAGNOSIS — R112 Nausea with vomiting, unspecified: Secondary | ICD-10-CM | POA: Diagnosis not present

## 2024-01-12 DIAGNOSIS — I130 Hypertensive heart and chronic kidney disease with heart failure and stage 1 through stage 4 chronic kidney disease, or unspecified chronic kidney disease: Secondary | ICD-10-CM | POA: Diagnosis not present

## 2024-01-12 DIAGNOSIS — Z91040 Latex allergy status: Secondary | ICD-10-CM | POA: Insufficient documentation

## 2024-01-12 DIAGNOSIS — R519 Headache, unspecified: Secondary | ICD-10-CM | POA: Diagnosis present

## 2024-01-12 DIAGNOSIS — Z743 Need for continuous supervision: Secondary | ICD-10-CM | POA: Diagnosis not present

## 2024-01-12 DIAGNOSIS — I509 Heart failure, unspecified: Secondary | ICD-10-CM | POA: Diagnosis not present

## 2024-01-12 DIAGNOSIS — R110 Nausea: Secondary | ICD-10-CM | POA: Diagnosis not present

## 2024-01-12 DIAGNOSIS — Z91010 Allergy to peanuts: Secondary | ICD-10-CM | POA: Diagnosis not present

## 2024-01-12 DIAGNOSIS — G4489 Other headache syndrome: Secondary | ICD-10-CM | POA: Insufficient documentation

## 2024-01-12 DIAGNOSIS — G43909 Migraine, unspecified, not intractable, without status migrainosus: Secondary | ICD-10-CM | POA: Diagnosis not present

## 2024-01-12 DIAGNOSIS — Z992 Dependence on renal dialysis: Secondary | ICD-10-CM | POA: Diagnosis not present

## 2024-01-12 DIAGNOSIS — I272 Pulmonary hypertension, unspecified: Secondary | ICD-10-CM | POA: Diagnosis not present

## 2024-01-12 DIAGNOSIS — Z993 Dependence on wheelchair: Secondary | ICD-10-CM | POA: Diagnosis not present

## 2024-01-12 LAB — CBC
HCT: 35.2 % — ABNORMAL LOW (ref 36.0–46.0)
Hemoglobin: 10.6 g/dL — ABNORMAL LOW (ref 12.0–15.0)
MCH: 30.6 pg (ref 26.0–34.0)
MCHC: 30.1 g/dL (ref 30.0–36.0)
MCV: 101.7 fL — ABNORMAL HIGH (ref 80.0–100.0)
Platelets: 137 10*3/uL — ABNORMAL LOW (ref 150–400)
RBC: 3.46 MIL/uL — ABNORMAL LOW (ref 3.87–5.11)
RDW: 15.6 % — ABNORMAL HIGH (ref 11.5–15.5)
WBC: 6.4 10*3/uL (ref 4.0–10.5)
nRBC: 0 % (ref 0.0–0.2)

## 2024-01-12 LAB — BRAIN NATRIURETIC PEPTIDE: B Natriuretic Peptide: 1946.9 pg/mL — ABNORMAL HIGH (ref 0.0–100.0)

## 2024-01-12 MED ORDER — DEXAMETHASONE SODIUM PHOSPHATE 10 MG/ML IJ SOLN
10.0000 mg | Freq: Once | INTRAMUSCULAR | Status: AC
  Administered 2024-01-12: 10 mg via INTRAVENOUS
  Filled 2024-01-12: qty 1

## 2024-01-12 MED ORDER — PROCHLORPERAZINE EDISYLATE 10 MG/2ML IJ SOLN
10.0000 mg | Freq: Once | INTRAMUSCULAR | Status: AC
  Administered 2024-01-12: 10 mg via INTRAVENOUS
  Filled 2024-01-12: qty 2

## 2024-01-12 MED ORDER — DIPHENHYDRAMINE HCL 50 MG/ML IJ SOLN
25.0000 mg | Freq: Once | INTRAMUSCULAR | Status: AC
  Administered 2024-01-12: 25 mg via INTRAVENOUS
  Filled 2024-01-12: qty 1

## 2024-01-12 NOTE — ED Triage Notes (Signed)
 Pt to ED by EMS from home with c/o severe headache which began 2 hours ago. Pt endorses nausea, vomiting and photosensitivity. Per EMS pt slept the entire way to the hospital. Pt is on the phone in triage asking her husband to come get her because she isnt going directly to a room. Arrives A+O, VSS.

## 2024-01-12 NOTE — ED Notes (Signed)
 Patient dc via PTAR to home. No additional questions for RN.

## 2024-01-12 NOTE — Discharge Instructions (Signed)
 Follow-up with your PCP in the next 3 to 5 days for reevaluation of your headache.  Get help right away if: Your headache: Gets very bad quickly. Gets worse after a lot of physical activity. You have any of these symptoms: You continue to vomit. A stiff neck. Trouble seeing. Your eye or ear hurts. Trouble speaking. Weak muscles or you lose muscle control. You lose your balance or have trouble walking. You feel like you will pass out (faint) or you pass out. You are mixed up (confused). You have a seizure.

## 2024-01-12 NOTE — ED Provider Triage Note (Signed)
 Emergency Medicine Provider Triage Evaluation Note  [name] , a 33 y.o. female  was evaluated in triage.  Pt complains of headache.  States that it felt similar to prior migraines.  Onset was about 4 hours ago.  She reports some improvement with Tylenol .  She states that she had some vomiting.  Denies fever or chills.  Denies numbness, weakness, or tingling.  Review of Systems  Positive: headache Negative: numbness  Physical Exam  BP 116/76   Temp 97.9 F (36.6 C)   Resp 18   Wt 31.8 kg   SpO2 93%   BMI 35.95 kg/m  Gen:   Awake, no distress   Resp:  Normal effort  MSK:   Moves extremities without difficulty  Other:  CN 3-12 intact  Medical Decision Making  Medically screening exam initiated at 3:35 AM.  Appropriate orders placed.  [name] was informed that the remainder of the evaluation will be completed by another provider, this initial triage assessment does not replace that evaluation, and the importance of remaining in the ED until their evaluation is complete.     Jeanne Dikes, PA-C 01/12/24 [phone]

## 2024-01-12 NOTE — ED Notes (Signed)
 Ptar called eta 45-1hr

## 2024-01-12 NOTE — ED Provider Notes (Signed)
[place] EMERGENCY DEPARTMENT AT [facility] Provider Note   CSN: [id number] Arrival date & time: 01/12/24  0315     Patient presents with: Headache   [name] is a 33 y.o. female with a history of ESRD, CHF, and spina bifida who presents the ED today for headache.  Patient reports headache that began around 1 AM this morning with associated nausea, vomiting, and photosensitivity.  No injury or trauma prior to onset of headache. History of headaches in the past and this feels similar. She took Tylenol  at home with improvement of symptoms.  No chest pain or shortness of breath.  Patient is still having photosensitivity and nausea at this time.  She is requesting headache cocktail.    Prior to Admission medications   Medication Sig Start Date End Date Taking? Authorizing Provider  albuterol  (PROVENTIL ) (2.5 MG/3ML) 0.083% nebulizer solution Take 3 mLs (2.5 mg total) by nebulization every 6 (six) hours as needed for wheezing or shortness of breath. 07/28/23   Austine Lefort, MD  albuterol  (VENTOLIN  HFA) 108 (90 Base) MCG/ACT inhaler Inhale 1-2 puffs into the lungs every 6 (six) hours as needed for wheezing or shortness of breath. Patient taking differently: Inhale 2 puffs into the lungs every 6 (six) hours as needed for wheezing or shortness of breath. 09/15/22   Jenelle Mis, FNP  AURYXIA  1 GM 210 MG(Fe) tablet Take 420 mg by mouth 2 (two) times daily with a meal. 04/15/18   [provider]  B Complex-C-Folic Acid  (DIALYVITE  800) 0.8 MG TABS Take 1 tablet by mouth daily. 12/07/23   [provider]  EPINEPHrine  0.3 mg/0.3 mL IJ SOAJ injection Inject 0.3 mg into the muscle as needed for anaphylaxis. 04/23/23   Jenelle Mis, FNP  metoprolol  succinate (TOPROL -XL) 100 MG 24 hr tablet Take 100 mg by mouth daily. 12/29/19   [provider]  omeprazole (PRILOSEC) 40 MG capsule Take 40 mg by mouth in the morning and at bedtime. 01/08/22   [provider]  ondansetron  (ZOFRAN ) 4 MG tablet Take 4 mg by mouth every 8 (eight) hours as needed for nausea or vomiting. 12/09/23   [provider]    Allergies: Ciprofloxacin ; Other; Peanut -containing drug products; Aleve [naproxen sodium]; Ceftriaxone ; Coconut (cocos nucifera); Influenza vaccines; Tetanus toxoid, adsorbed; Tetanus toxoids; and Latex    Review of Systems  Neurological:  Positive for headaches.  All other systems reviewed and are negative.   Updated Vital Signs BP (!) 119/92   Pulse 90   Temp 97.9 F (36.6 C)   Resp 17   Wt 31.8 kg   SpO2 98%   BMI 35.95 kg/m   Physical Exam Vitals and nursing note reviewed.  Constitutional:      General: She is not in acute distress.    Appearance: Normal appearance.  HENT:     Head: Normocephalic and atraumatic.     Mouth/Throat:     Mouth: Mucous membranes are moist.   Eyes:     Conjunctiva/sclera: Conjunctivae normal.     Pupils: Pupils are equal, round, and reactive to light.    Cardiovascular:     Rate and Rhythm: Normal rate and regular rhythm.     Pulses: Normal pulses.     Heart sounds: Normal heart sounds.  Pulmonary:     Effort: Pulmonary effort is normal.     Breath sounds: Normal breath sounds.  Abdominal:     Palpations: Abdomen is soft.  Tenderness: There is no abdominal tenderness.   Musculoskeletal:     Cervical back: Normal range of motion.     Comments: Bilateral AKAs   Skin:    General: Skin is warm and dry.     Findings: No rash.   Neurological:     General: No focal deficit present.     Mental Status: She is alert.   Psychiatric:        Mood and Affect: Mood normal.        Behavior: Behavior normal.    (all labs ordered are listed, but only abnormal results are displayed) Labs Reviewed  CBC - Abnormal; Notable for the following components:      Result Value   RBC 3.46 (*)    Hemoglobin 10.6 (*)    HCT 35.2 (*)    MCV 101.7 (*)    RDW 15.6 (*)    Platelets  137 (*)    All other components within normal limits  BRAIN NATRIURETIC PEPTIDE - Abnormal; Notable for the following components:   B Natriuretic Peptide 1,946.9 (*)    All other components within normal limits    EKG: None  Radiology: No results found.   Procedures   Medications Ordered in the ED  prochlorperazine  (COMPAZINE ) injection 10 mg (10 mg Intravenous Given 01/12/24 0853)  dexamethasone  (DECADRON ) injection 10 mg (10 mg Intravenous Given 01/12/24 0854)  diphenhydrAMINE  (BENADRYL ) injection 25 mg (25 mg Intravenous Given 01/12/24 0855)                                    Medical Decision Making Amount and/or Complexity of Data Reviewed Labs: ordered.  Risk Prescription drug management.   This patient presents to the ED for concern of headache, this involves an extensive number of treatment options, and is a complaint that carries with it a high risk of complications and morbidity.   Differential diagnosis includes: Cluster headache, tension headache, migraine, etc. Low suspicion for intracranial abnormality - no acute focal neuro deficits.   Comorbidities  See HPI above   Additional History  Additional history obtained from prior records   Lab Tests  I ordered and personally interpreted labs.  The pertinent results include:   BNP and CBC are within normal limits for patient   Problem List / ED Course / Critical Interventions / Medication Management  Patient developed a headache around 1 AM this morning.  States that it felt like her normal headaches except at this time she had vomiting with it.  Denies any aggravating incidents prior to onset of headache.  Had 1 episode of vomiting, nausea, and associated photosensitivity.  No vision changes, weakness, or dizziness.  No recent injury or trauma. She took Tylenol  at home prior to arrival with improvement of symptoms. At the time of my evaluation, patient reports still feeling nauseous and having  photosensitivity.  Requesting headache cocktail. I ordered medications including: Compazine , Decadron , and Benadryl  for headache  Reevaluation of the patient after these medicines showed that the patient resolved.   Social Determinants of Health  Tobacco use   Test / Admission - Considered  Patient is stable and safe for discharge home. Return precautions provided.    Final diagnoses:  Other headache syndrome    ED Discharge Orders     None          Sonnie Dusky, PA-C 01/12/24 1042    Rosealee Concha, MD  01/12/24 1953  

## 2024-01-13 DIAGNOSIS — D631 Anemia in chronic kidney disease: Secondary | ICD-10-CM | POA: Diagnosis not present

## 2024-01-13 DIAGNOSIS — N2581 Secondary hyperparathyroidism of renal origin: Secondary | ICD-10-CM | POA: Diagnosis not present

## 2024-01-13 DIAGNOSIS — Z992 Dependence on renal dialysis: Secondary | ICD-10-CM | POA: Diagnosis not present

## 2024-01-13 DIAGNOSIS — E8770 Fluid overload, unspecified: Secondary | ICD-10-CM | POA: Diagnosis not present

## 2024-01-13 DIAGNOSIS — N186 End stage renal disease: Secondary | ICD-10-CM | POA: Diagnosis not present

## 2024-01-14 DIAGNOSIS — Z992 Dependence on renal dialysis: Secondary | ICD-10-CM | POA: Diagnosis not present

## 2024-01-14 DIAGNOSIS — N2581 Secondary hyperparathyroidism of renal origin: Secondary | ICD-10-CM | POA: Diagnosis not present

## 2024-01-14 DIAGNOSIS — D631 Anemia in chronic kidney disease: Secondary | ICD-10-CM | POA: Diagnosis not present

## 2024-01-14 DIAGNOSIS — E8770 Fluid overload, unspecified: Secondary | ICD-10-CM | POA: Diagnosis not present

## 2024-01-14 DIAGNOSIS — N186 End stage renal disease: Secondary | ICD-10-CM | POA: Diagnosis not present

## 2024-01-15 DIAGNOSIS — D631 Anemia in chronic kidney disease: Secondary | ICD-10-CM | POA: Diagnosis not present

## 2024-01-15 DIAGNOSIS — Z992 Dependence on renal dialysis: Secondary | ICD-10-CM | POA: Diagnosis not present

## 2024-01-15 DIAGNOSIS — N186 End stage renal disease: Secondary | ICD-10-CM | POA: Diagnosis not present

## 2024-01-15 DIAGNOSIS — E8770 Fluid overload, unspecified: Secondary | ICD-10-CM | POA: Diagnosis not present

## 2024-01-15 DIAGNOSIS — N2581 Secondary hyperparathyroidism of renal origin: Secondary | ICD-10-CM | POA: Diagnosis not present

## 2024-01-18 DIAGNOSIS — E8770 Fluid overload, unspecified: Secondary | ICD-10-CM | POA: Diagnosis not present

## 2024-01-18 DIAGNOSIS — N2581 Secondary hyperparathyroidism of renal origin: Secondary | ICD-10-CM | POA: Diagnosis not present

## 2024-01-18 DIAGNOSIS — D631 Anemia in chronic kidney disease: Secondary | ICD-10-CM | POA: Diagnosis not present

## 2024-01-18 DIAGNOSIS — Z992 Dependence on renal dialysis: Secondary | ICD-10-CM | POA: Diagnosis not present

## 2024-01-18 DIAGNOSIS — N186 End stage renal disease: Secondary | ICD-10-CM | POA: Diagnosis not present

## 2024-01-20 DIAGNOSIS — N186 End stage renal disease: Secondary | ICD-10-CM | POA: Diagnosis not present

## 2024-01-20 DIAGNOSIS — Z992 Dependence on renal dialysis: Secondary | ICD-10-CM | POA: Diagnosis not present

## 2024-01-20 DIAGNOSIS — D631 Anemia in chronic kidney disease: Secondary | ICD-10-CM | POA: Diagnosis not present

## 2024-01-20 DIAGNOSIS — E8770 Fluid overload, unspecified: Secondary | ICD-10-CM | POA: Diagnosis not present

## 2024-01-20 DIAGNOSIS — N2581 Secondary hyperparathyroidism of renal origin: Secondary | ICD-10-CM | POA: Diagnosis not present

## 2024-01-22 DIAGNOSIS — Z992 Dependence on renal dialysis: Secondary | ICD-10-CM | POA: Diagnosis not present

## 2024-01-22 DIAGNOSIS — N2581 Secondary hyperparathyroidism of renal origin: Secondary | ICD-10-CM | POA: Diagnosis not present

## 2024-01-22 DIAGNOSIS — N186 End stage renal disease: Secondary | ICD-10-CM | POA: Diagnosis not present

## 2024-01-22 DIAGNOSIS — E8770 Fluid overload, unspecified: Secondary | ICD-10-CM | POA: Diagnosis not present

## 2024-01-22 DIAGNOSIS — D631 Anemia in chronic kidney disease: Secondary | ICD-10-CM | POA: Diagnosis not present

## 2024-01-25 DIAGNOSIS — E8770 Fluid overload, unspecified: Secondary | ICD-10-CM | POA: Diagnosis not present

## 2024-01-25 DIAGNOSIS — N2581 Secondary hyperparathyroidism of renal origin: Secondary | ICD-10-CM | POA: Diagnosis not present

## 2024-01-25 DIAGNOSIS — D631 Anemia in chronic kidney disease: Secondary | ICD-10-CM | POA: Diagnosis not present

## 2024-01-25 DIAGNOSIS — Z992 Dependence on renal dialysis: Secondary | ICD-10-CM | POA: Diagnosis not present

## 2024-01-25 DIAGNOSIS — N186 End stage renal disease: Secondary | ICD-10-CM | POA: Diagnosis not present

## 2024-01-26 DIAGNOSIS — I132 Hypertensive heart and chronic kidney disease with heart failure and with stage 5 chronic kidney disease, or end stage renal disease: Secondary | ICD-10-CM | POA: Diagnosis not present

## 2024-01-26 DIAGNOSIS — I120 Hypertensive chronic kidney disease with stage 5 chronic kidney disease or end stage renal disease: Secondary | ICD-10-CM | POA: Diagnosis not present

## 2024-01-26 DIAGNOSIS — N186 End stage renal disease: Secondary | ICD-10-CM | POA: Diagnosis not present

## 2024-01-26 DIAGNOSIS — D631 Anemia in chronic kidney disease: Secondary | ICD-10-CM | POA: Diagnosis not present

## 2024-01-26 DIAGNOSIS — I5032 Chronic diastolic (congestive) heart failure: Secondary | ICD-10-CM | POA: Diagnosis not present

## 2024-01-26 DIAGNOSIS — Z992 Dependence on renal dialysis: Secondary | ICD-10-CM | POA: Diagnosis not present

## 2024-01-27 DIAGNOSIS — Z992 Dependence on renal dialysis: Secondary | ICD-10-CM | POA: Diagnosis not present

## 2024-01-27 DIAGNOSIS — N186 End stage renal disease: Secondary | ICD-10-CM | POA: Diagnosis not present

## 2024-01-27 DIAGNOSIS — D631 Anemia in chronic kidney disease: Secondary | ICD-10-CM | POA: Diagnosis not present

## 2024-01-27 DIAGNOSIS — N2581 Secondary hyperparathyroidism of renal origin: Secondary | ICD-10-CM | POA: Diagnosis not present

## 2024-01-29 DIAGNOSIS — N2581 Secondary hyperparathyroidism of renal origin: Secondary | ICD-10-CM | POA: Diagnosis not present

## 2024-01-29 DIAGNOSIS — Z992 Dependence on renal dialysis: Secondary | ICD-10-CM | POA: Diagnosis not present

## 2024-01-29 DIAGNOSIS — N186 End stage renal disease: Secondary | ICD-10-CM | POA: Diagnosis not present

## 2024-01-29 DIAGNOSIS — D631 Anemia in chronic kidney disease: Secondary | ICD-10-CM | POA: Diagnosis not present

## 2024-02-01 DIAGNOSIS — Z992 Dependence on renal dialysis: Secondary | ICD-10-CM | POA: Diagnosis not present

## 2024-02-01 DIAGNOSIS — N186 End stage renal disease: Secondary | ICD-10-CM | POA: Diagnosis not present

## 2024-02-01 DIAGNOSIS — N2581 Secondary hyperparathyroidism of renal origin: Secondary | ICD-10-CM | POA: Diagnosis not present

## 2024-02-01 DIAGNOSIS — D631 Anemia in chronic kidney disease: Secondary | ICD-10-CM | POA: Diagnosis not present

## 2024-02-01 NOTE — Progress Notes (Deleted)
 [name], female    DOB: 1991/06/25   MRN: [id number]   Brief patient profile:  ***  *** pt of Dr Rolan Sharps with Pleural effusion referred to pulmonary clinic 02/03/2024 by *** for ***        History of Present Illness  02/03/2024  Pulmonary/ 1st office eval/[name]  No chief complaint on file.    Dyspnea:  *** Cough: *** Sleep: *** SABA use: *** 02 use:*** LDSCT:***  No obvious day to day or daytime pattern/variability or assoc excess/ purulent sputum or mucus plugs or hemoptysis or cp or chest tightness, subjective wheeze or overt sinus or hb symptoms.    Also denies any obvious fluctuation of symptoms with weather or environmental changes or other aggravating or alleviating factors except as outlined above   No unusual exposure hx or h/o childhood pna/ asthma or knowledge of premature birth.  Current Allergies, Complete Past Medical History, Past Surgical History, Family History, and Social History were reviewed in [employer] record.  ROS  The following are not active complaints unless bolded Hoarseness, sore throat, dysphagia, dental problems, itching, sneezing,  nasal congestion or discharge of excess mucus or purulent secretions, ear ache,   fever, chills, sweats, unintended wt loss or wt gain, classically pleuritic or exertional cp,  orthopnea pnd or arm/hand swelling  or leg swelling, presyncope, palpitations, abdominal pain, anorexia, nausea, vomiting, diarrhea  or change in bowel habits or change in bladder habits, change in stools or change in urine, dysuria, hematuria,  rash, arthralgias, visual complaints, headache, numbness, weakness or ataxia or problems with walking or coordination,  change in mood or  memory.             Outpatient Medications Prior to Visit  Medication Sig Dispense Refill   albuterol  (PROVENTIL ) (2.5 MG/3ML) 0.083% nebulizer solution Take 3 mLs (2.5 mg total) by nebulization every 6 (six) hours as needed for wheezing  or shortness of breath. 150 mL 1   albuterol  (VENTOLIN  HFA) 108 (90 Base) MCG/ACT inhaler Inhale 1-2 puffs into the lungs every 6 (six) hours as needed for wheezing or shortness of breath. (Patient taking differently: Inhale 2 puffs into the lungs every 6 (six) hours as needed for wheezing or shortness of breath.) 1 each 0   AURYXIA  1 GM 210 MG(Fe) tablet Take 420 mg by mouth 2 (two) times daily with a meal.  3   B Complex-C-Folic Acid  (DIALYVITE  800) 0.8 MG TABS Take 1 tablet by mouth daily.     EPINEPHrine  0.3 mg/0.3 mL IJ SOAJ injection Inject 0.3 mg into the muscle as needed for anaphylaxis. 2 each 1   metoprolol  succinate (TOPROL -XL) 100 MG 24 hr tablet Take 100 mg by mouth daily.     omeprazole (PRILOSEC) 40 MG capsule Take 40 mg by mouth in the morning and at bedtime.     ondansetron  (ZOFRAN ) 4 MG tablet Take 4 mg by mouth every 8 (eight) hours as needed for nausea or vomiting.     No facility-administered medications prior to visit.    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   Depression 05/14/2015   Dialysis care    ESRD (end stage renal disease) on dialysis [facility])    GERD (gastroesophageal reflux disease) 01/07/2017   HTN (hypertension) 05/02/2011   Murmur,  cardiac 07/17/2022   OSA (obstructive sleep apnea) 10/26/2023   Pneumonia, unspecified organism 07/27/2023   Spina bifida    UTI (lower urinary tract infection)    Viral URI with cough 09/15/2022      Objective:     There were no vitals taken for this visit.         Assessment   No problem-specific Assessment & Plan notes found for this encounter.     Jeanne America, MD 02/01/2024

## 2024-02-03 ENCOUNTER — Inpatient Hospital Stay: Admitting: Internal Medicine

## 2024-02-03 DIAGNOSIS — Z992 Dependence on renal dialysis: Secondary | ICD-10-CM | POA: Diagnosis not present

## 2024-02-03 DIAGNOSIS — D631 Anemia in chronic kidney disease: Secondary | ICD-10-CM | POA: Diagnosis not present

## 2024-02-03 DIAGNOSIS — N2581 Secondary hyperparathyroidism of renal origin: Secondary | ICD-10-CM | POA: Diagnosis not present

## 2024-02-03 DIAGNOSIS — N186 End stage renal disease: Secondary | ICD-10-CM | POA: Diagnosis not present

## 2024-02-04 ENCOUNTER — Ambulatory Visit

## 2024-02-05 DIAGNOSIS — Z992 Dependence on renal dialysis: Secondary | ICD-10-CM | POA: Diagnosis not present

## 2024-02-05 DIAGNOSIS — D631 Anemia in chronic kidney disease: Secondary | ICD-10-CM | POA: Diagnosis not present

## 2024-02-05 DIAGNOSIS — N2581 Secondary hyperparathyroidism of renal origin: Secondary | ICD-10-CM | POA: Diagnosis not present

## 2024-02-05 DIAGNOSIS — N186 End stage renal disease: Secondary | ICD-10-CM | POA: Diagnosis not present

## 2024-02-08 DIAGNOSIS — N2581 Secondary hyperparathyroidism of renal origin: Secondary | ICD-10-CM | POA: Diagnosis not present

## 2024-02-08 DIAGNOSIS — Z992 Dependence on renal dialysis: Secondary | ICD-10-CM | POA: Diagnosis not present

## 2024-02-08 DIAGNOSIS — N186 End stage renal disease: Secondary | ICD-10-CM | POA: Diagnosis not present

## 2024-02-08 DIAGNOSIS — D631 Anemia in chronic kidney disease: Secondary | ICD-10-CM | POA: Diagnosis not present

## 2024-02-10 DIAGNOSIS — N2581 Secondary hyperparathyroidism of renal origin: Secondary | ICD-10-CM | POA: Diagnosis not present

## 2024-02-10 DIAGNOSIS — D631 Anemia in chronic kidney disease: Secondary | ICD-10-CM | POA: Diagnosis not present

## 2024-02-10 DIAGNOSIS — Z992 Dependence on renal dialysis: Secondary | ICD-10-CM | POA: Diagnosis not present

## 2024-02-10 DIAGNOSIS — N186 End stage renal disease: Secondary | ICD-10-CM | POA: Diagnosis not present

## 2024-02-12 DIAGNOSIS — D631 Anemia in chronic kidney disease: Secondary | ICD-10-CM | POA: Diagnosis not present

## 2024-02-12 DIAGNOSIS — N186 End stage renal disease: Secondary | ICD-10-CM | POA: Diagnosis not present

## 2024-02-12 DIAGNOSIS — Z992 Dependence on renal dialysis: Secondary | ICD-10-CM | POA: Diagnosis not present

## 2024-02-12 DIAGNOSIS — N2581 Secondary hyperparathyroidism of renal origin: Secondary | ICD-10-CM | POA: Diagnosis not present

## 2024-02-15 DIAGNOSIS — N2581 Secondary hyperparathyroidism of renal origin: Secondary | ICD-10-CM | POA: Diagnosis not present

## 2024-02-15 DIAGNOSIS — D631 Anemia in chronic kidney disease: Secondary | ICD-10-CM | POA: Diagnosis not present

## 2024-02-15 DIAGNOSIS — N186 End stage renal disease: Secondary | ICD-10-CM | POA: Diagnosis not present

## 2024-02-15 DIAGNOSIS — Z992 Dependence on renal dialysis: Secondary | ICD-10-CM | POA: Diagnosis not present

## 2024-02-16 DIAGNOSIS — Z131 Encounter for screening for diabetes mellitus: Secondary | ICD-10-CM | POA: Diagnosis not present

## 2024-02-16 DIAGNOSIS — Z0000 Encounter for general adult medical examination without abnormal findings: Secondary | ICD-10-CM | POA: Diagnosis not present

## 2024-02-16 DIAGNOSIS — I10 Essential (primary) hypertension: Secondary | ICD-10-CM | POA: Diagnosis not present

## 2024-02-16 DIAGNOSIS — Z6841 Body Mass Index (BMI) 40.0 and over, adult: Secondary | ICD-10-CM | POA: Diagnosis not present

## 2024-02-17 DIAGNOSIS — N186 End stage renal disease: Secondary | ICD-10-CM | POA: Diagnosis not present

## 2024-02-17 DIAGNOSIS — D631 Anemia in chronic kidney disease: Secondary | ICD-10-CM | POA: Diagnosis not present

## 2024-02-17 DIAGNOSIS — N2581 Secondary hyperparathyroidism of renal origin: Secondary | ICD-10-CM | POA: Diagnosis not present

## 2024-02-17 DIAGNOSIS — I5032 Chronic diastolic (congestive) heart failure: Secondary | ICD-10-CM | POA: Diagnosis not present

## 2024-02-17 DIAGNOSIS — Z992 Dependence on renal dialysis: Secondary | ICD-10-CM | POA: Diagnosis not present

## 2024-02-17 DIAGNOSIS — I132 Hypertensive heart and chronic kidney disease with heart failure and with stage 5 chronic kidney disease, or end stage renal disease: Secondary | ICD-10-CM | POA: Diagnosis not present

## 2024-02-19 DIAGNOSIS — N2581 Secondary hyperparathyroidism of renal origin: Secondary | ICD-10-CM | POA: Diagnosis not present

## 2024-02-19 DIAGNOSIS — Z992 Dependence on renal dialysis: Secondary | ICD-10-CM | POA: Diagnosis not present

## 2024-02-19 DIAGNOSIS — N186 End stage renal disease: Secondary | ICD-10-CM | POA: Diagnosis not present

## 2024-02-19 DIAGNOSIS — D631 Anemia in chronic kidney disease: Secondary | ICD-10-CM | POA: Diagnosis not present

## 2024-02-20 ENCOUNTER — Encounter (HOSPITAL_COMMUNITY): Payer: Self-pay | Admitting: *Deleted

## 2024-02-20 ENCOUNTER — Other Ambulatory Visit: Payer: Self-pay

## 2024-02-20 ENCOUNTER — Emergency Department (HOSPITAL_COMMUNITY)
Admission: EM | Admit: 2024-02-20 | Discharge: 2024-02-21 | Disposition: A | Discharge status: Home / Self Care | Attending: Emergency Medicine | Admitting: Emergency Medicine

## 2024-02-20 DIAGNOSIS — Y92410 Unspecified street and highway as the place of occurrence of the external cause: Secondary | ICD-10-CM | POA: Diagnosis not present

## 2024-02-20 DIAGNOSIS — M25561 Pain in right knee: Secondary | ICD-10-CM | POA: Diagnosis not present

## 2024-02-20 DIAGNOSIS — N186 End stage renal disease: Secondary | ICD-10-CM | POA: Insufficient documentation

## 2024-02-20 DIAGNOSIS — Z992 Dependence on renal dialysis: Secondary | ICD-10-CM | POA: Insufficient documentation

## 2024-02-20 DIAGNOSIS — I120 Hypertensive chronic kidney disease with stage 5 chronic kidney disease or end stage renal disease: Secondary | ICD-10-CM | POA: Diagnosis not present

## 2024-02-20 DIAGNOSIS — Z91040 Latex allergy status: Secondary | ICD-10-CM | POA: Insufficient documentation

## 2024-02-20 DIAGNOSIS — J45909 Unspecified asthma, uncomplicated: Secondary | ICD-10-CM | POA: Insufficient documentation

## 2024-02-20 DIAGNOSIS — M25562 Pain in left knee: Secondary | ICD-10-CM | POA: Diagnosis not present

## 2024-02-20 DIAGNOSIS — Z91010 Allergy to peanuts: Secondary | ICD-10-CM | POA: Diagnosis not present

## 2024-02-20 DIAGNOSIS — Z041 Encounter for examination and observation following transport accident: Secondary | ICD-10-CM | POA: Diagnosis not present

## 2024-02-20 DIAGNOSIS — M25511 Pain in right shoulder: Secondary | ICD-10-CM | POA: Diagnosis not present

## 2024-02-20 DIAGNOSIS — M25512 Pain in left shoulder: Secondary | ICD-10-CM | POA: Diagnosis not present

## 2024-02-20 MED ORDER — OXYCODONE-ACETAMINOPHEN 5-325 MG PO TABS
ORAL_TABLET | ORAL | Status: AC
  Filled 2024-02-20: qty 1

## 2024-02-20 MED ORDER — OXYCODONE-ACETAMINOPHEN 5-325 MG PO TABS
1.0000 | ORAL_TABLET | Freq: Once | ORAL | Status: AC
  Administered 2024-02-20: 1 via ORAL
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
## 2024-02-21 ENCOUNTER — Emergency Department (HOSPITAL_COMMUNITY)

## 2024-02-21 DIAGNOSIS — M25561 Pain in right knee: Secondary | ICD-10-CM | POA: Diagnosis not present

## 2024-02-21 DIAGNOSIS — Z041 Encounter for examination and observation following transport accident: Secondary | ICD-10-CM | POA: Diagnosis not present

## 2024-02-21 DIAGNOSIS — M25512 Pain in left shoulder: Secondary | ICD-10-CM | POA: Diagnosis not present

## 2024-02-21 DIAGNOSIS — M25562 Pain in left knee: Secondary | ICD-10-CM | POA: Diagnosis not present

## 2024-02-21 MED ORDER — OXYCODONE-ACETAMINOPHEN 5-325 MG PO TABS
1.0000 | ORAL_TABLET | Freq: Four times a day (QID) | ORAL | 0 refills | Status: DC | PRN
Start: 2024-02-21 — End: 2024-06-07

## 2024-02-21 MED ORDER — OXYCODONE-ACETAMINOPHEN 5-325 MG PO TABS
1.0000 | ORAL_TABLET | Freq: Once | ORAL | Status: AC
  Administered 2024-02-21: 1 via ORAL
  Filled 2024-02-21: qty 1
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
## 2024-02-22 ENCOUNTER — Ambulatory Visit: Payer: Self-pay | Admitting: Cardiology

## 2024-02-22 DIAGNOSIS — N186 End stage renal disease: Secondary | ICD-10-CM | POA: Diagnosis not present

## 2024-02-22 DIAGNOSIS — N2581 Secondary hyperparathyroidism of renal origin: Secondary | ICD-10-CM | POA: Diagnosis not present

## 2024-02-22 DIAGNOSIS — I10 Essential (primary) hypertension: Secondary | ICD-10-CM

## 2024-02-22 DIAGNOSIS — D631 Anemia in chronic kidney disease: Secondary | ICD-10-CM | POA: Diagnosis not present

## 2024-02-22 DIAGNOSIS — I5022 Chronic systolic (congestive) heart failure: Secondary | ICD-10-CM

## 2024-02-22 DIAGNOSIS — Z992 Dependence on renal dialysis: Secondary | ICD-10-CM | POA: Diagnosis not present

## 2024-02-22 DIAGNOSIS — G4733 Obstructive sleep apnea (adult) (pediatric): Secondary | ICD-10-CM

## 2024-02-23 DIAGNOSIS — N186 End stage renal disease: Secondary | ICD-10-CM | POA: Diagnosis not present

## 2024-02-24 DIAGNOSIS — N186 End stage renal disease: Secondary | ICD-10-CM | POA: Diagnosis not present

## 2024-02-24 DIAGNOSIS — Z992 Dependence on renal dialysis: Secondary | ICD-10-CM | POA: Diagnosis not present

## 2024-02-24 DIAGNOSIS — N2581 Secondary hyperparathyroidism of renal origin: Secondary | ICD-10-CM | POA: Diagnosis not present

## 2024-02-24 DIAGNOSIS — D631 Anemia in chronic kidney disease: Secondary | ICD-10-CM | POA: Diagnosis not present

## 2024-02-26 DIAGNOSIS — I120 Hypertensive chronic kidney disease with stage 5 chronic kidney disease or end stage renal disease: Secondary | ICD-10-CM | POA: Diagnosis not present

## 2024-02-26 DIAGNOSIS — N2581 Secondary hyperparathyroidism of renal origin: Secondary | ICD-10-CM | POA: Diagnosis not present

## 2024-02-26 DIAGNOSIS — N186 End stage renal disease: Secondary | ICD-10-CM | POA: Diagnosis not present

## 2024-02-26 DIAGNOSIS — Z992 Dependence on renal dialysis: Secondary | ICD-10-CM | POA: Diagnosis not present

## 2024-02-28 NOTE — Progress Notes (Signed)
 [name], female    DOB: 1991/01/30   MRN: [id number]   Brief patient profile:  53 yobf  Active smoker   pt of Dr Rolan Sharps with Pleural effusion referred to pulmonary clinic 03/03/2024   -  01/04/24 d/c'd chest tube  Admit date:     12/30/2023  Discharge date: 01/04/2024          Recommendations for Outpatient Follow-up:  Follow up with PCP in 1-2 weeks Follow up with outpatient pulmonology Please follow up on the following pending results: pleural fluid cultures        Discharge Condition:Stable CODE STATUS:FULL Diet recommendation: Renal Fluid Restriction: 1400 ml/day   Hospital Summary: HPI:  [name] is a 33 y.o. female with medical history significant of spina bifida, HTN, ESRD (HD on MWF), and depression p/w SOB iso R pleural effusion.   Pt was in her USOH until this morning at 0300 when she experienced acute onset SOB while sitting up watching television for which pt activated EMS. Pt reports having her last HD session on Monday. Denies any sick contacts or recent medications changes.   In the ED, pt tachycardic and tachypneic on RA. Labs notable for BUN/Cr 42/6.41, BNP 1039, troponin 79-->78, and Hb 11.7.  CTA neg for PE, but did show large R pleural effusion with RLL and RML collapse. Pt admitted to medicine w/ Nephrology following.     Expand All Collapse All   [facility] Physician Discharge Summary    Patient name: [name]  Admit date:     12/30/2023  Discharge date: 01/04/2024  Attending Physician: LAURENCE ERIC [[id number]]  Discharge Physician: Camellia LAURENCE    PCP: [facility]   Admitted From: Home   Disposition:  Home   Recommendations for Outpatient Follow-up:  Follow up with PCP in 1-2 weeks Follow up with outpatient pulmonology Please follow up on the following pending results: pleural fluid cultures   Home Health:No Equipment/Devices: None       Discharge Condition:Stable CODE STATUS:FULL Diet recommendation:  Renal Fluid Restriction: 1400 ml/day   Hospital Summary: HPI:  [name] is a 33 y.o. female with medical history significant of spina bifida, HTN, ESRD (HD on MWF), and depression p/w SOB iso R pleural effusion.   Pt was in her USOH until this morning at 0300 when she experienced acute onset SOB while sitting up watching television for which pt activated EMS. Pt reports having her last HD session on Monday. Denies any sick contacts or recent medications changes.   In the ED, pt tachycardic and tachypneic on RA. Labs notable for BUN/Cr 42/6.41, BNP 1039, troponin 79-->78, and Hb 11.7.  CTA neg for PE, but did show large R pleural effusion with RLL and RML collapse. Pt admitted to medicine w/ Nephrology following.     Significant Events: Admitted 12/30/2023 for recurrent right pleural effusion     Admission Labs: WBC 7.1, HgB 11.7, plt 132 Na 140, K 4.3, CO2 of 24, BUN 42, scr 6.41, glu 88 TP 6.8, alb 3.0, AST 26, ALT 16, T. Bili 1.2 BNP 1039   Admission Imaging Studies: CXR Progressive right-sided airspace disease and pleural opacification. Most likely represents a loculated pleural effusion Airspace disease is concerning for pneumonia or post-obstructive disease. 2. Stable cardiac enlargement CTPA No pulmonary embolism. 2. Large right pleural effusion with associated right lower lobe collapse and partial right middle lobe collapse. 3. Diffuse ground-glass attenuation within the aerated lungs, no mass lesion. This  likely reflects edema   Significant Labs: Thoracentesis Labs:       Lab Results  Component Value Date/Time    FLUIDTYPE PLEURAL FLUID CYTO 01/02/2024 11:40 AM    COLORFL YELLOW (A) 01/02/2024 11:40 AM    APPEARANCEFL HAZY (A) 01/02/2024 11:40 AM    WBCFLUID 579 01/02/2024 11:40 AM    FNEUT 76 (H) 01/02/2024 11:40 AM    LYMPHSFL 11 01/02/2024 11:40 AM    MONOMACSERFL 10 (L) 01/02/2024 11:40 AM    EOSFL 3 01/02/2024 11:40 AM    OTHERCELLSBF FEW PYKNOTIC NEUTROPHILS  NOTED. 01/02/2024 11:40 AM    ALBFL 1.5 01/02/2024 11:40 AM    ALBUMIN 2.8 (L) 01/01/2024 09:20 AM    GLUCOSEFL 78 01/02/2024 11:40 AM    TPROTFLUID <3.0 01/02/2024 11:40 AM    PROT 6.8 12/30/2023 04:14 AM    LDHFL 343 (H) 01/02/2024 11:40 AM    SDES PLEURAL 01/01/2024 04:17 PM    SPECREQUEST PLEURAL 01/01/2024 04:17 PM    GRAMSTAIN NO WBC SEEN NO ORGANISMS SEEN   01/01/2024 04:17 PM    CULT   01/01/2024 04:17 PM      NO GROWTH 3 DAYS Performed at [facility], [address] [place], [place] [address]      REPTSTATUS 01/04/2024 FINAL 01/01/2024 04:17 PM    Significant Imaging Studies: 01-02-2024 CT chest Right pleural drainage catheter in place with small pneumothorax. The pleural effusion has been completely evacuated. 2. Significant airspace disease, most notably in the right middle lobe. Although this could be re-expansion edema, infiltrate/pneumonia is also possible. 3. Stable cardiac enlargement and pericardial effusion. 4. Age advanced vascular calcifications   Antibiotic Therapy: Anti-infectives (From admission, onward)      None           Procedures: 01-01-2024 right pigtail chest tube by IR 01-04-2024 chest tube removal   Consultants: Nephrology [facility] Course by Problem: * Recurrent pleural effusion on right 12-31-2023 PCCM consulted. Per PCCM notes, they will ask IR for thoracentesis. Pt states someone told her that she is not getting thoracentesis. She isn't sure who said that. PCCM recommended extra fluid removal with HD. Nephrology made aware of PCCM recs.   01-01-2024 s/p chest tube placement. Defer to PCCM if she needs intra-pleural lytics. IV dilaudid  ordered x 1 for pain. Po dilaudid  prn q4h prn.   01-02-2024 Has another 400 ml from chest tube since last night.  Total output thus far has been about 1.2 liters(600 ml from initial chest tube insertion) and the 800 ml in her chest atrium thus far. Today's CXR shows lots of improvement in  pleural effusion since chest tube placement. Awaiting for PCCM to decide if pt needs intra-pleural lytics today.   01-03-2024 PCCM plans for chest tube removal today. Pt can home go later this evening.   01-04-2024 chest removal delayed due to PCCM APP yesterday evening not qualified to remove chest tube with retaining string. Chest tube removed today. Pleural fluid analysis shows transudate. F/u with PCCM. No concern for pneumonia or empyema. Has never been febrile. No leukocytosis. Only reason pleural effusion was drained was due to increase in size since Dec 2024 and pt's inability to tolerated increase in UF during HD(due to increased fluid removal causing severe muscle cramping).   S/P chest tube placement [facility]) - 01-01-2024-resolved as of 01/04/2024 01-01-2024 placed due to loculated pleural effusion. Management per PCCM.   01-02-2024 Has another 400 ml from chest tube since  last night.  Total output thus far has been about 1.2 liters(600 ml from initial chest tube insertion) and the 800 ml in her chest atrium thus far. Today's CXR shows lots of improvement in pleural effusion since chest tube placement. Awaiting for PCCM to decide if pt needs intra-pleural lytics today.   01-03-2024 PCCM plans for chest tube removal today.        History of Present Illness  03/03/2024  Pulmonary/ 1st office eval/Erin Uecker  Chief Complaint  Patient presents with   Follow-up    pleural fluid studies Pt was in the hospital in June for 4-5 days.    Dyspnea:  breathing is no worse since ct came out  Cough: in am's  mucoid min vol x 5 min to clear it  Sleep: on back, flat bed one pillows and cpap x month going fine  SABA use: not using     No obvious day to day or daytime pattern/variability or assoc excess/ purulent sputum or mucus plugs or hemoptysis or cp or chest tightness, subjective wheeze or overt sinus or hb symptoms.    Also denies any obvious fluctuation of symptoms with weather or environmental  changes or other aggravating or alleviating factors except as outlined above   No unusual exposure hx or h/o childhood pna/ asthma or knowledge of premature birth.  Current Allergies, Complete Past Medical History, Past Surgical History, Family History, and Social History were reviewed in [employer] record.  ROS  The following are not active complaints unless bolded Hoarseness, sore throat, dysphagia, dental problems, itching, sneezing,  nasal congestion or discharge of excess mucus or purulent secretions, ear ache,   fever, chills, sweats, unintended wt loss or wt gain, classically pleuritic or exertional cp,  orthopnea pnd or arm/hand swelling  or leg swelling, presyncope, palpitations, abdominal pain, anorexia, nausea, vomiting, diarrhea  or change in bowel habits or change in bladder habits, change in stools or change in urine, dysuria, hematuria,  rash, arthralgias, visual complaints, headache, numbness, weakness    change in mood= anxious  or  memory.             Outpatient Medications Prior to Visit  Medication Sig Dispense Refill   albuterol  (PROVENTIL ) (2.5 MG/3ML) 0.083% nebulizer solution Take 3 mLs (2.5 mg total) by nebulization every 6 (six) hours as needed for wheezing or shortness of breath. 150 mL 1   albuterol  (VENTOLIN  HFA) 108 (90 Base) MCG/ACT inhaler Inhale 1-2 puffs into the lungs every 6 (six) hours as needed for wheezing or shortness of breath. (Patient taking differently: Inhale 2 puffs into the lungs every 6 (six) hours as needed for wheezing or shortness of breath.) 1 each 0   AURYXIA  1 GM 210 MG(Fe) tablet Take 420 mg by mouth 2 (two) times daily with a meal.  3   B Complex-C-Folic Acid  (DIALYVITE  800) 0.8 MG TABS Take 1 tablet by mouth daily.     EPINEPHrine  0.3 mg/0.3 mL IJ SOAJ injection Inject 0.3 mg into the muscle as needed for anaphylaxis. 2 each 1   metoprolol  succinate (TOPROL -XL) 100 MG 24 hr tablet Take 100 mg by mouth daily.      omeprazole (PRILOSEC) 40 MG capsule Take 40 mg by mouth in the morning and at bedtime.     ondansetron  (ZOFRAN ) 4 MG tablet Take 4 mg by mouth every 8 (eight) hours as needed for nausea or vomiting.     oxyCODONE -acetaminophen  (PERCOCET/ROXICET) 5-325 MG tablet Take 1 tablet by mouth  every 6 (six) hours as needed for severe pain (pain score 7-10). 5 tablet 0   No facility-administered medications prior to visit.    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   Depression 05/14/2015   Dialysis care    ESRD (end stage renal disease) on dialysis [facility])    GERD (gastroesophageal reflux disease) 01/07/2017   HTN (hypertension) 05/02/2011   Murmur, cardiac 07/17/2022   OSA (obstructive sleep apnea) 10/26/2023   Pneumonia, unspecified organism 07/27/2023   Spina bifida    UTI (lower urinary tract infection)    Viral URI with cough 09/15/2022      Objective:     BP 106/64   Pulse 95   Temp 98.2 F (36.8 C)   Wt 70 lb 12.8 oz (32.1 kg)   SpO2 97% Comment: RA  BMI 36.36 kg/m   SpO2: 97 % (RA) w/c bound young bf nad    HEENT : Oropharynx  clear    Nasal turbinates nl    NECK :  without  apparent JVD/ palpable Nodes/TM    LUNGS: no acc muscle use,  clear lungs x for prominent bronchial BS R LL laterally    CV:  RRR  no s3 or murmur or increase in P2, and no edema   ABD:  soft and nontender   SKIN: warm and dry without lesions    NEURO:  alert, approp, nl sensorium with  no motor or cerebellar deficits apparent.      CXR PA and Lateral:   03/03/2024 :    I personally reviewed images and impression is as follows:     Enlarging loculated R effusion    Assessment   Assessment & Plan Recurrent pleural effusion on right See admit 12/30/23  - R chest tube by IR with wbc  579 N> P   LDH 343 and Alb gradient 1.2   but  prot < 3 / cyt neg  - 03/03/2024 reaccumulation of effusion but asymptomatic (w/c bound at baseline)   Most likely this is late parapneumonic process with likely organization/ fibrotic pleural reaction now with patent airways based on bronchial BS at R base and no evidence of airway obst of any kind and though she is a smoking lung ca seems very unlikely at age 87  Rec Will need repeat CT with contrast which should be ok given ESRF but will need to check with renal and get Dr Theressa thoughts on this case also - suprisingly not symptomatic at all at this point likely due to sedentary because she is so sedentary at baseline  Discussed in detail all the  indications, usual  risks and alternatives  relative to the benefits with patient who agrees to proceed with w/u as outlined.        Cigarette smoker Counseled re importance of smoking cessation but did not meet time criteria for separate billing    Each maintenance medication was reviewed in detail including emphasizing most importantly the difference between maintenance and prns and under what circumstances the prns are to be triggered using an action plan format where appropriate.  Total time for H and P, chart review, counseling, reviewing hfa/ neb device(s) and generating customized AVS unique to this office visit / same day charting = 41 min pt new to me with  refractory  respiratory problems of uncertain etiology           Patient Instructions  Please remember to go to the  x-ray department  for your tests - we will call you with the results when they are available     I will discuss your case with DR Claudene for his recommendations  but more than likely you will need referral to a Thoracic Surgeon for VATS (video assisted thorascopic Surgery)      Ozell America, MD 03/05/2024

## 2024-02-29 DIAGNOSIS — N2581 Secondary hyperparathyroidism of renal origin: Secondary | ICD-10-CM | POA: Diagnosis not present

## 2024-02-29 DIAGNOSIS — Z992 Dependence on renal dialysis: Secondary | ICD-10-CM | POA: Diagnosis not present

## 2024-02-29 DIAGNOSIS — N186 End stage renal disease: Secondary | ICD-10-CM | POA: Diagnosis not present

## 2024-02-29 NOTE — Telephone Encounter (Signed)
Order placed to [facility] via community message.

## 2024-03-02 DIAGNOSIS — N2581 Secondary hyperparathyroidism of renal origin: Secondary | ICD-10-CM | POA: Diagnosis not present

## 2024-03-02 DIAGNOSIS — Z992 Dependence on renal dialysis: Secondary | ICD-10-CM | POA: Diagnosis not present

## 2024-03-02 DIAGNOSIS — N186 End stage renal disease: Secondary | ICD-10-CM | POA: Diagnosis not present

## 2024-03-03 ENCOUNTER — Ambulatory Visit (INDEPENDENT_AMBULATORY_CARE_PROVIDER_SITE_OTHER): Admitting: Internal Medicine

## 2024-03-03 ENCOUNTER — Ambulatory Visit: Admitting: Neurology

## 2024-03-03 ENCOUNTER — Encounter: Payer: Self-pay | Admitting: Internal Medicine

## 2024-03-03 ENCOUNTER — Ambulatory Visit

## 2024-03-03 VITALS — BP 106/64 | HR 95 | Temp 98.2°F | Wt 70.8 lb

## 2024-03-03 DIAGNOSIS — J90 Pleural effusion, not elsewhere classified: Secondary | ICD-10-CM

## 2024-03-03 DIAGNOSIS — F17210 Nicotine dependence, cigarettes, uncomplicated: Secondary | ICD-10-CM

## 2024-03-03 DIAGNOSIS — R918 Other nonspecific abnormal finding of lung field: Secondary | ICD-10-CM | POA: Diagnosis not present

## 2024-03-03 NOTE — Patient Instructions (Signed)
 Please remember to go to the  x-ray department  for your tests - we will call you with the results when they are available     I will discuss your case with DR Claudene for his recommendations  but more than likely you will need referral to a Thoracic Surgeon for VATS (video assisted thorascopic Surgery)

## 2024-03-04 DIAGNOSIS — N2581 Secondary hyperparathyroidism of renal origin: Secondary | ICD-10-CM | POA: Diagnosis not present

## 2024-03-04 DIAGNOSIS — Z992 Dependence on renal dialysis: Secondary | ICD-10-CM | POA: Diagnosis not present

## 2024-03-04 DIAGNOSIS — N186 End stage renal disease: Secondary | ICD-10-CM | POA: Diagnosis not present

## 2024-03-05 ENCOUNTER — Encounter: Payer: Self-pay | Admitting: Internal Medicine

## 2024-03-05 DIAGNOSIS — F17210 Nicotine dependence, cigarettes, uncomplicated: Secondary | ICD-10-CM | POA: Insufficient documentation

## 2024-03-05 NOTE — Assessment & Plan Note (Addendum)
 Counseled re importance of smoking cessation but did not meet time criteria for separate billing    Each maintenance medication was reviewed in detail including emphasizing most importantly the difference between maintenance and prns and under what circumstances the prns are to be triggered using an action plan format where appropriate.  Total time for H and P, chart review, counseling, reviewing hfa/ neb device(s) and generating customized AVS unique to this office visit / same day charting = 41 min pt new to me with  refractory respiratory problems of uncertain etiology

## 2024-03-05 NOTE — Assessment & Plan Note (Addendum)
 See admit 12/30/23  - R chest tube by IR with wbc  579 N> P   LDH 343 and Alb gradient 1.2   but prot < 3 / cyt neg  - 03/03/2024 reaccumulation of effusion but asymptomatic (w/c bound at baseline)   Most likely this is late parapneumonic process with likely organization/ fibrotic pleural reaction now with patent airways based on bronchial BS at R base and no evidence of airway obst of any kind and though she is a smoking lung ca seems very unlikely at age 33  Rec Will need repeat CT with contrast which should be ok given ESRF but will need to check with renal and get Dr Theressa thoughts on this case also - suprisingly not symptomatic at all at this point likely due to sedentary because she is so sedentary at baseline  Discussed in detail all the  indications, usual  risks and alternatives  relative to the benefits with patient who agrees to proceed with w/u as outlined.

## 2024-03-07 DIAGNOSIS — Z992 Dependence on renal dialysis: Secondary | ICD-10-CM | POA: Diagnosis not present

## 2024-03-07 DIAGNOSIS — N186 End stage renal disease: Secondary | ICD-10-CM | POA: Diagnosis not present

## 2024-03-07 DIAGNOSIS — N2581 Secondary hyperparathyroidism of renal origin: Secondary | ICD-10-CM | POA: Diagnosis not present

## 2024-03-08 ENCOUNTER — Telehealth: Payer: Self-pay | Admitting: Internal Medicine

## 2024-03-08 NOTE — Telephone Encounter (Signed)
 Hi, Dr Darlean would like this patient's effusion followed up by a different outpatient doctor.  Both MR and MH saw this patient as inpatient.  I tried to call patient to see if recurrent symptoms but received VM.  Can one of triage staff try calling her again and see if she wants additional workup and followup if he breathing has worsened?  Thank you Rolan Sharps MD PCCM

## 2024-03-09 DIAGNOSIS — N2581 Secondary hyperparathyroidism of renal origin: Secondary | ICD-10-CM | POA: Diagnosis not present

## 2024-03-09 DIAGNOSIS — N186 End stage renal disease: Secondary | ICD-10-CM | POA: Diagnosis not present

## 2024-03-09 DIAGNOSIS — Z992 Dependence on renal dialysis: Secondary | ICD-10-CM | POA: Diagnosis not present

## 2024-03-11 ENCOUNTER — Ambulatory Visit: Payer: Self-pay | Admitting: Internal Medicine

## 2024-03-11 DIAGNOSIS — J90 Pleural effusion, not elsewhere classified: Secondary | ICD-10-CM

## 2024-03-11 DIAGNOSIS — N186 End stage renal disease: Secondary | ICD-10-CM | POA: Diagnosis not present

## 2024-03-11 DIAGNOSIS — Z992 Dependence on renal dialysis: Secondary | ICD-10-CM | POA: Diagnosis not present

## 2024-03-11 DIAGNOSIS — N2581 Secondary hyperparathyroidism of renal origin: Secondary | ICD-10-CM | POA: Diagnosis not present

## 2024-03-11 NOTE — Telephone Encounter (Signed)
 Radiology isn't sure this is loculated fluid so I'm starting with a ct with contrast and will arrange f/u in [place] but who is provider MH?  I see MR and [place] on the list progress note list only

## 2024-03-11 NOTE — Telephone Encounter (Signed)
 I called the pt and there was no answer- LMTCB and will route to myself for f/u

## 2024-03-11 NOTE — Progress Notes (Signed)
 Called the pt and there was no answer- LMTCB

## 2024-03-11 NOTE — Telephone Encounter (Signed)
 Copied from CRM [id number]. Topic: Clinical - Lab/Test Results >> Mar 11, 2024 11:38 AM Isabell A wrote: Reason for CRM: Patients boyfriend Maranda Calk returning phone call from [place] for results - patient is currently going to dialysis and won't be back until after 4:30 pm. Requesting a MyChart message with results.    Callback number: [phone]  Called patient with results.will place order.NFN

## 2024-03-14 DIAGNOSIS — N186 End stage renal disease: Secondary | ICD-10-CM | POA: Diagnosis not present

## 2024-03-14 DIAGNOSIS — N2581 Secondary hyperparathyroidism of renal origin: Secondary | ICD-10-CM | POA: Diagnosis not present

## 2024-03-14 DIAGNOSIS — Z992 Dependence on renal dialysis: Secondary | ICD-10-CM | POA: Diagnosis not present

## 2024-03-16 DIAGNOSIS — N186 End stage renal disease: Secondary | ICD-10-CM | POA: Diagnosis not present

## 2024-03-16 DIAGNOSIS — N2581 Secondary hyperparathyroidism of renal origin: Secondary | ICD-10-CM | POA: Diagnosis not present

## 2024-03-16 DIAGNOSIS — Z992 Dependence on renal dialysis: Secondary | ICD-10-CM | POA: Diagnosis not present

## 2024-03-18 DIAGNOSIS — N2581 Secondary hyperparathyroidism of renal origin: Secondary | ICD-10-CM | POA: Diagnosis not present

## 2024-03-18 DIAGNOSIS — Z992 Dependence on renal dialysis: Secondary | ICD-10-CM | POA: Diagnosis not present

## 2024-03-18 DIAGNOSIS — N186 End stage renal disease: Secondary | ICD-10-CM | POA: Diagnosis not present

## 2024-03-19 DIAGNOSIS — N289 Disorder of kidney and ureter, unspecified: Secondary | ICD-10-CM | POA: Diagnosis not present

## 2024-03-19 DIAGNOSIS — I10 Essential (primary) hypertension: Secondary | ICD-10-CM | POA: Diagnosis not present

## 2024-03-19 DIAGNOSIS — I5032 Chronic diastolic (congestive) heart failure: Secondary | ICD-10-CM | POA: Diagnosis not present

## 2024-03-21 DIAGNOSIS — Z992 Dependence on renal dialysis: Secondary | ICD-10-CM | POA: Diagnosis not present

## 2024-03-21 DIAGNOSIS — N2581 Secondary hyperparathyroidism of renal origin: Secondary | ICD-10-CM | POA: Diagnosis not present

## 2024-03-21 DIAGNOSIS — N186 End stage renal disease: Secondary | ICD-10-CM | POA: Diagnosis not present

## 2024-03-22 ENCOUNTER — Inpatient Hospital Stay
Admission: RE | Admit: 2024-03-22 | Discharge: 2024-03-22 | Disposition: A | Discharge status: Home / Self Care | Source: Ambulatory Visit | Attending: Internal Medicine | Admitting: Internal Medicine

## 2024-03-22 DIAGNOSIS — J90 Pleural effusion, not elsewhere classified: Secondary | ICD-10-CM

## 2024-03-22 MED ORDER — IOPAMIDOL (ISOVUE-300) INJECTION 61%
75.0000 mL | Freq: Once | INTRAVENOUS | Status: AC | PRN
  Administered 2024-03-22: 75 mL via INTRAVENOUS

## 2024-03-23 ENCOUNTER — Ambulatory Visit: Payer: Self-pay | Admitting: Internal Medicine

## 2024-03-23 DIAGNOSIS — Z992 Dependence on renal dialysis: Secondary | ICD-10-CM | POA: Diagnosis not present

## 2024-03-23 DIAGNOSIS — N186 End stage renal disease: Secondary | ICD-10-CM | POA: Diagnosis not present

## 2024-03-23 DIAGNOSIS — N2581 Secondary hyperparathyroidism of renal origin: Secondary | ICD-10-CM | POA: Diagnosis not present

## 2024-03-24 ENCOUNTER — Other Ambulatory Visit: Payer: Self-pay | Admitting: Internal Medicine

## 2024-03-24 ENCOUNTER — Telehealth: Payer: Self-pay

## 2024-03-24 DIAGNOSIS — J90 Pleural effusion, not elsewhere classified: Secondary | ICD-10-CM

## 2024-03-24 NOTE — Telephone Encounter (Signed)
 [name]

## 2024-03-24 NOTE — Progress Notes (Signed)
 Called and spoke with the pt's partner,Nelson, ok per DPR and notified of results/recs per Dr. Darlean. He verbalized understanding and states pt okay with referral. I have placed referral.

## 2024-03-25 DIAGNOSIS — Z992 Dependence on renal dialysis: Secondary | ICD-10-CM | POA: Diagnosis not present

## 2024-03-25 DIAGNOSIS — N186 End stage renal disease: Secondary | ICD-10-CM | POA: Diagnosis not present

## 2024-03-25 DIAGNOSIS — N2581 Secondary hyperparathyroidism of renal origin: Secondary | ICD-10-CM | POA: Diagnosis not present

## 2024-03-28 DIAGNOSIS — Z992 Dependence on renal dialysis: Secondary | ICD-10-CM | POA: Diagnosis not present

## 2024-03-28 DIAGNOSIS — N2581 Secondary hyperparathyroidism of renal origin: Secondary | ICD-10-CM | POA: Diagnosis not present

## 2024-03-28 DIAGNOSIS — N186 End stage renal disease: Secondary | ICD-10-CM | POA: Diagnosis not present

## 2024-03-28 DIAGNOSIS — I120 Hypertensive chronic kidney disease with stage 5 chronic kidney disease or end stage renal disease: Secondary | ICD-10-CM | POA: Diagnosis not present

## 2024-03-28 NOTE — Progress Notes (Deleted)
 SLEEP MEDICINE VIRTUAL CONSULT NOTE via Video Note   Because of [name]'s co-morbid illnesses, she is at least at moderate risk for complications without adequate follow up.  This format is felt to be most appropriate for this patient at this time.  All issues noted in this document were discussed and addressed.  A limited physical exam was performed with this format.  Please refer to the patient's chart for her consent to telehealth for [facility].      Date:  03/28/2024   ID:  [name], DOB 06-Feb-1991, MRN [id number] The patient was identified using 2 identifiers.  Patient Location: Home Provider Location: Home Office   PCP:  [facility]   [facility] Providers Cardiologist:  Soyla DELENA Merck, MD     Evaluation Performed:  New Patient Evaluation  Chief Complaint:  OSA  History of Present Illness:    [name] is a 33 y.o. female who is being seen today for the evaluation of OSA at the request of Jeanne Shuck, MD.  SINIYAH Haynes is a 33 y.o. female with a hx of asthma, depression, spina bifida, ESRD on HD, GER, HTN, chronic diastolic CHF/RV failiure, mitral stenosis followed by AHF clinic.  She mentioned that she would gasp for breath during sleep and had problems with snoring.  A HST was ordered showing Severe OSA with an AHI of 70.2/hr, mild to moderate CSA with pAHIc 14.4/hr and nocturnal hypoxemia with O2 sat nadir 60% and O2 sats <88% for 239 minutes along with severe snoring.   She underwent CPAP titration but due to complex sleep apnea with ongoing respiratory events she went on to BiPAp titration and ultimately was started on ResMed auto BiPAP at 13/9cm H2O with an Airfit F10 mask. She is now referred for sleep medicine consultation for treatment of OSA>  She is doing well with her PAP device and thinks that she has gotten used to it.  SHe tolerates the mask and feels the pressure is adequate.  Since going on  PAP she feels rested in the am and has no significant daytime sleepiness.  She denies any significant mouth or nasal dryness or nasal congestion.  She does not think that he snores. Patient denies any episodes of bruxism, restless legs, No gagging hallucinations or cataplectic events.     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   Depression 05/14/2015   Dialysis care    ESRD (end stage renal disease) on dialysis [facility])    GERD (gastroesophageal reflux disease) 01/07/2017   HTN (hypertension) 05/02/2011   Murmur, cardiac 07/17/2022   OSA (obstructive sleep apnea) 10/26/2023   Pneumonia, unspecified organism 07/27/2023   Spina bifida    UTI (lower urinary tract infection)    Viral URI with cough 09/15/2022   Past Surgical History:  Procedure Laterality Date   A/V FISTULAGRAM Left 10/14/2021   Procedure: A/V Fistulagram;  Surgeon: Sheree Penne Bruckner, MD;  Location: [facility] INVASIVE CV LAB;  Service: Cardiovascular;  Laterality: Left;   AV FISTULA PLACEMENT     left arm   INSERTION OF DIALYSIS CATHETER N/A 05/08/2020   Procedure: ATTEMPTED, UNSUCCESSFUL INSERTION OF DIALYSIS CATHETER TUNNELED RIGHT INTERNAL JUGULAR;  Surgeon: Kallie Manuelita BROCKS, MD;  Location: [facility] ORS;  Service: General;  Laterality: N/A;   IR FLUORO GUIDE CV LINE RIGHT  05/09/2020   IR PERC PLEURAL DRAIN W/INDWELL CATH W/IMG GUIDE  01/01/2024   IR US  GUIDE VASC ACCESS RIGHT  05/09/2020   RIGHT HEART CATH N/A 06/30/2023   Procedure: RIGHT HEART CATH;  Surgeon: Rolan Jeanne RAMAN, MD;  Location: [facility] INVASIVE CV LAB;  Service: Cardiovascular;  Laterality: N/A;     No outpatient medications have been marked as taking for the 03/29/24 encounter (Appointment) with Shlomo Wilbert SAUNDERS, MD.     Allergies:   Ciprofloxacin ; Other; Peanut -containing drug products; Aleve [naproxen  sodium]; Ceftriaxone ; Coconut (cocos nucifera); Influenza vaccines; Tetanus toxoid, adsorbed; Tetanus toxoids; and Latex   Social History   Tobacco Use   Smoking status: Every Day    Current packs/day: 0.20    Average packs/day: 0.2 packs/day for 14.0 years (2.8 ttl pk-yrs)    Types: Cigarettes    Passive exposure: Current   Smokeless tobacco: Never   Tobacco comments:    Pt is no longer smoking cigarettes, but smokes marijuana once a day  Substance Use Topics   Alcohol use: No   Drug use: Yes    Types: Marijuana     Family Hx: The patient's family history includes Arthritis in her maternal grandmother; Breast cancer in her maternal aunt; Hypertension in her maternal grandmother; Kidney cancer in an other family member. There is no history of Colon cancer.  ROS:   Please see the history of present illness.     All other systems reviewed and are negative.   Prior Sleep studies:   The following studies were reviewed today:  HST/CPAP titration/BiPAP titration/PAP compliance download  Labs/Other Tests and Data Reviewed:     Recent Labs: 07/23/2023: Magnesium  1.9 12/30/2023: ALT 16 01/04/2024: BUN 49; Creatinine, Ser 7.81; Potassium 4.5; Sodium 136 01/12/2024: B Natriuretic Peptide 1,946.9; Hemoglobin 10.6; Platelets 137    Wt Readings from Last 3 Encounters:  03/03/24 70 lb 12.8 oz (32.1 kg)  02/20/24 70 lb 1.7 oz (31.8 kg)  01/12/24 70 lb (31.8 kg)     Risk Assessment/Calculations:          Objective:    Vital Signs:  There were no vitals taken for this visit.   VITAL SIGNS:  reviewed GEN:  no acute distress EYES:  sclerae anicteric, EOMI - Extraocular Movements Intact RESPIRATORY:  normal respiratory effort, symmetric expansion CARDIOVASCULAR:  no peripheral edema SKIN:  no rash, lesions or ulcers. MUSCULOSKELETAL:  no obvious deformities. NEURO:  alert and oriented x 3, no obvious focal deficit PSYCH:  normal affect  ASSESSMENT & PLAN:    OSA - The  patient is tolerating PAP therapy well without any problems. The PAP download performed by his DME was personally reviewed and interpreted by me today and showed an AHI of 1.8/hr on BiPAP at 13/9 cm H2O with 17% compliance in using more than 4 hours nightly.  The patient has been using and benefiting from PAP use and will continue to benefit from therapy.  -encouraged her to be more compliant and try to use her device at least 5 hours nightly  HTN -BP controlled on exam today -continue toprol  XL 100mg  daily with PRN refills   Time:   Today, I have spent 20 minutes with the patient with telehealth technology discussing the above problems.     Medication Adjustments/Labs and Tests Ordered: Current medicines are reviewed at length with the patient today.  Concerns regarding medicines are outlined above.   Tests  Ordered: No orders of the defined types were placed in this encounter.   Medication Changes: No orders of the defined types were placed in this encounter.   Follow Up:   in 1 year(s)  Signed, Wilbert Bihari, MD  03/28/2024 6:53 PM     [facility]

## 2024-03-29 ENCOUNTER — Ambulatory Visit: Attending: Cardiology | Admitting: Cardiology

## 2024-03-29 DIAGNOSIS — I10 Essential (primary) hypertension: Secondary | ICD-10-CM

## 2024-03-29 DIAGNOSIS — G4733 Obstructive sleep apnea (adult) (pediatric): Secondary | ICD-10-CM

## 2024-03-30 DIAGNOSIS — N186 End stage renal disease: Secondary | ICD-10-CM | POA: Diagnosis not present

## 2024-03-30 DIAGNOSIS — N2581 Secondary hyperparathyroidism of renal origin: Secondary | ICD-10-CM | POA: Diagnosis not present

## 2024-03-30 DIAGNOSIS — Z992 Dependence on renal dialysis: Secondary | ICD-10-CM | POA: Diagnosis not present

## 2024-03-31 ENCOUNTER — Ambulatory Visit (HOSPITAL_COMMUNITY): Admission: RE | Admit: 2024-03-31 | Source: Home / Self Care | Admitting: Surgery

## 2024-03-31 ENCOUNTER — Ambulatory Visit: Admitting: Thoracic Surgery (Cardiothoracic Vascular Surgery)

## 2024-03-31 ENCOUNTER — Encounter (HOSPITAL_COMMUNITY): Admission: RE | Payer: Self-pay | Source: Home / Self Care

## 2024-03-31 SURGERY — A/V SHUNT INTERVENTION
Anesthesia: LOCAL | Site: Arm Upper | Laterality: Left

## 2024-04-01 ENCOUNTER — Telehealth: Payer: Self-pay | Admitting: Cardiology

## 2024-04-01 DIAGNOSIS — Z992 Dependence on renal dialysis: Secondary | ICD-10-CM | POA: Diagnosis not present

## 2024-04-01 DIAGNOSIS — N186 End stage renal disease: Secondary | ICD-10-CM | POA: Diagnosis not present

## 2024-04-01 DIAGNOSIS — N2581 Secondary hyperparathyroidism of renal origin: Secondary | ICD-10-CM | POA: Diagnosis not present

## 2024-04-01 NOTE — Telephone Encounter (Signed)
 Pt would like to reschedule her MyChart visit.

## 2024-04-04 DIAGNOSIS — N2581 Secondary hyperparathyroidism of renal origin: Secondary | ICD-10-CM | POA: Diagnosis not present

## 2024-04-04 DIAGNOSIS — Z992 Dependence on renal dialysis: Secondary | ICD-10-CM | POA: Diagnosis not present

## 2024-04-04 DIAGNOSIS — N186 End stage renal disease: Secondary | ICD-10-CM | POA: Diagnosis not present

## 2024-04-04 NOTE — Telephone Encounter (Signed)
 Dr Darlean- this pt never did return my call. Looking at her future appts, she is scheduled with Kerrin on 9/``/15. Does she still need f/u here?

## 2024-04-04 NOTE — Telephone Encounter (Signed)
 Darlean Ozell NOVAK, MD to Me     04/04/24 12:09 PM No need for f/u in office - if doing worse between now and T surgy eval then go to [facility]    I sent this communication via mychart msg to the pt  Will close this encounter

## 2024-04-05 DIAGNOSIS — J45909 Unspecified asthma, uncomplicated: Secondary | ICD-10-CM | POA: Diagnosis not present

## 2024-04-05 DIAGNOSIS — N186 End stage renal disease: Secondary | ICD-10-CM | POA: Diagnosis not present

## 2024-04-05 DIAGNOSIS — I5032 Chronic diastolic (congestive) heart failure: Secondary | ICD-10-CM | POA: Diagnosis not present

## 2024-04-05 DIAGNOSIS — I132 Hypertensive heart and chronic kidney disease with heart failure and with stage 5 chronic kidney disease, or end stage renal disease: Secondary | ICD-10-CM | POA: Diagnosis not present

## 2024-04-06 DIAGNOSIS — Z992 Dependence on renal dialysis: Secondary | ICD-10-CM | POA: Diagnosis not present

## 2024-04-06 DIAGNOSIS — N186 End stage renal disease: Secondary | ICD-10-CM | POA: Diagnosis not present

## 2024-04-06 DIAGNOSIS — N2581 Secondary hyperparathyroidism of renal origin: Secondary | ICD-10-CM | POA: Diagnosis not present

## 2024-04-07 ENCOUNTER — Encounter: Payer: Self-pay | Admitting: Thoracic Surgery (Cardiothoracic Vascular Surgery)

## 2024-04-07 ENCOUNTER — Ambulatory Visit
Attending: Thoracic Surgery (Cardiothoracic Vascular Surgery) | Admitting: Thoracic Surgery (Cardiothoracic Vascular Surgery)

## 2024-04-07 ENCOUNTER — Other Ambulatory Visit: Payer: Self-pay | Admitting: Thoracic Surgery (Cardiothoracic Vascular Surgery)

## 2024-04-07 VITALS — BP 125/78 | HR 100 | Resp 18 | Ht <= 58 in | Wt <= 1120 oz

## 2024-04-07 DIAGNOSIS — J90 Pleural effusion, not elsewhere classified: Secondary | ICD-10-CM | POA: Diagnosis not present

## 2024-04-07 DIAGNOSIS — N186 End stage renal disease: Secondary | ICD-10-CM | POA: Diagnosis not present

## 2024-04-07 DIAGNOSIS — Z992 Dependence on renal dialysis: Secondary | ICD-10-CM | POA: Diagnosis not present

## 2024-04-07 MED ORDER — PREDNISONE 10 MG (21) PO TBPK: ORAL_TABLET | ORAL | 0 refills | Status: AC

## 2024-04-07 NOTE — Progress Notes (Signed)
 PCP is Financial risk analyst, Physicist, medical Referring Provider is Darlean Ozell NOVAK, MD  Chief Complaint  Patient presents with   Pleural Effusion    New patient consult, Loculated Pleural effusion Chest CT 8/26    HPI: [name] is sent for consultation regarding a pleural effusion.  [name] is a 33 year old woman with a complicated medical history including spina bifida, caudal regression syndrome, end-stage renal disease on hemodialysis, severe mitral stenosis, congestive heart failure, obstructive sleep apnea, hypertension, and reflux.  In June 2025 she presented with shortness of breath.  CT showed a moderately large right pleural effusion.  A pigtail catheter was placed.  The effusion resolved.  Her symptoms improved.  The LDH was elevated but all other characteristics of the fluid were consistent with a transudate.  She did have an elevated BNP at the time.  She apparently has between pulmonology follow-up visits.  She had a repeat CT on 03/22/2024 which showed a moderate right pleural effusion very similar in appearance to her previous effusion.    She currently denies any shortness of breath.  Occasional cough.   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   Depression 05/14/2015   Dialysis care    ESRD (end stage renal disease) on dialysis [facility])    GERD (gastroesophageal reflux disease) 01/07/2017   HTN (hypertension) 05/02/2011   Murmur, cardiac 07/17/2022   OSA (obstructive sleep apnea) 10/26/2023   Pneumonia, unspecified organism 07/27/2023   Spina bifida    UTI (lower urinary tract infection)    Viral URI with cough 09/15/2022    Past Surgical History:  Procedure Laterality Date   A/V FISTULAGRAM Left 10/14/2021   Procedure: A/V Fistulagram;  Surgeon: Sheree Penne Bruckner, MD;  Location: [facility] INVASIVE CV LAB;   Service: Cardiovascular;  Laterality: Left;   AV FISTULA PLACEMENT     left arm   INSERTION OF DIALYSIS CATHETER N/A 05/08/2020   Procedure: ATTEMPTED, UNSUCCESSFUL INSERTION OF DIALYSIS CATHETER TUNNELED RIGHT INTERNAL JUGULAR;  Surgeon: Kallie Manuelita BROCKS, MD;  Location: [facility];  Service: General;  Laterality: N/A;   IR FLUORO GUIDE CV LINE RIGHT  05/09/2020   IR PERC PLEURAL DRAIN W/INDWELL CATH W/IMG GUIDE  01/01/2024   IR US  GUIDE VASC ACCESS RIGHT  05/09/2020   RIGHT HEART CATH N/A 06/30/2023   Procedure: RIGHT HEART CATH;  Surgeon: Rolan Ezra RAMAN, MD;  Location: [facility] INVASIVE CV LAB;  Service: Cardiovascular;  Laterality: N/A;    Family History  Problem Relation Age of Onset   Kidney cancer Other    Hypertension Maternal Grandmother    Arthritis Maternal Grandmother    Breast cancer Maternal Aunt    Colon cancer Neg Hx     Social History Social History   Tobacco Use   Smoking status: Every Day    Current packs/day: 0.20    Average packs/day: 0.2 packs/day for 14.0 years (2.8 ttl pk-yrs)    Types: Cigarettes    Passive exposure: Current   Smokeless tobacco: Never   Tobacco comments:    Pt is no longer smoking cigarettes, but smokes marijuana once a day  Substance Use Topics   Alcohol use: No   Drug use: Yes    Types: Marijuana    Current Outpatient Medications  Medication Sig Dispense Refill   albuterol  (PROVENTIL ) (2.5 MG/3ML) 0.083% nebulizer  solution Take 3 mLs (2.5 mg total) by nebulization every 6 (six) hours as needed for wheezing or shortness of breath. 150 mL 1   albuterol  (VENTOLIN  HFA) 108 (90 Base) MCG/ACT inhaler Inhale 1-2 puffs into the lungs every 6 (six) hours as needed for wheezing or shortness of breath. (Patient taking differently: Inhale 2 puffs into the lungs every 6 (six) hours as needed for wheezing or shortness of breath.) 1 each 0   AURYXIA  1 GM 210 MG(Fe) tablet Take 420 mg by mouth 2 (two) times daily with a meal.  3   B Complex-C-Folic Acid   (DIALYVITE  800) 0.8 MG TABS Take 1 tablet by mouth daily.     EPINEPHrine  0.3 mg/0.3 mL IJ SOAJ injection Inject 0.3 mg into the muscle as needed for anaphylaxis. 2 each 1   metoprolol  succinate (TOPROL -XL) 100 MG 24 hr tablet Take 100 mg by mouth daily.     omeprazole (PRILOSEC) 40 MG capsule Take 40 mg by mouth in the morning and at bedtime.     ondansetron  (ZOFRAN ) 4 MG tablet Take 4 mg by mouth every 8 (eight) hours as needed for nausea or vomiting.     oxyCODONE -acetaminophen  (PERCOCET/ROXICET) 5-325 MG tablet Take 1 tablet by mouth every 6 (six) hours as needed for severe pain (pain score 7-10). 5 tablet 0   predniSONE  (STERAPRED UNI-PAK 21 TAB) 10 MG (21) TBPK tablet Take 6 tablets (60 mg total) by mouth daily for 1 day, THEN 5 tablets (50 mg total) daily for 1 day, THEN 4 tablets (40 mg total) daily for 1 day, THEN 3 tablets (30 mg total) daily for 1 day, THEN 2 tablets (20 mg total) daily for 1 day, THEN 1 tablet (10 mg total) daily for 1 day. 21 tablet 0   No current facility-administered medications for this visit.    Allergies  Allergen Reactions   Ciprofloxacin  Shortness Of Breath, Nausea And Vomiting and Other (See Comments)    HIGH FEVER and oral blisters    Other Anaphylaxis    Revaclear dialzer   Peanut -Containing Drug Products Anaphylaxis   Aleve [Naproxen Sodium] Other (See Comments)    G.I.Bleed   Ceftriaxone  Other (See Comments)    Blisters in mouth    Coconut (Cocos Nucifera) Hives   Influenza Vaccines Nausea And Vomiting and Other (See Comments)    High fever   Tetanus Toxoid, Adsorbed Nausea And Vomiting and Other (See Comments)    HIGH FEVER, also   Tetanus Toxoid-Containing Vaccines Nausea And Vomiting and Other (See Comments)    HIGH FEVER   Latex Itching and Rash    Review of Systems  BP 125/78 (BP Location: Right Arm, Patient Position: Sitting, Cuff Size: Normal)   Pulse 100   Resp 18   Ht 3' 1 (0.94 m)   Wt 70 lb (31.8 kg)   SpO2 95% Comment: RA   BMI 35.95 kg/m  Physical Exam Vitals reviewed.  Constitutional:      General: She is not in acute distress.    Appearance: She is ill-appearing.  HENT:     Head: Normocephalic and atraumatic.  Eyes:     Extraocular Movements: Extraocular movements intact.  Cardiovascular:     Rate and Rhythm: Normal rate and regular rhythm.     Heart sounds: Murmur (3/6 holosystolic murmur) heard.  Pulmonary:     Effort: No respiratory distress.     Breath sounds: No wheezing or rales.     Comments: Severely diminished breath sounds right  base Musculoskeletal:     Comments: Fistula left arm  Skin:    General: Skin is warm and dry.  Neurological:     Mental Status: She is alert and oriented to person, place, and time.      Diagnostic Tests: CT CHEST WITH CONTRAST   TECHNIQUE: Multidetector CT imaging of the chest was performed during intravenous contrast administration.   RADIATION DOSE REDUCTION: This exam was performed according to the departmental dose-optimization program which includes automated exposure control, adjustment of the mA and/or kV according to patient size and/or use of iterative reconstruction technique.   CONTRAST:  75mL ISOVUE -300 IOPAMIDOL  (ISOVUE -300) INJECTION 61%   COMPARISON:  Multiple priors including chest CT January 02, 2024 and CT radiograph March 03, 2024   FINDINGS: Cardiovascular: Four-chamber cardiac enlargement. Aortic atherosclerosis. Calcifications of the mitral annulus. Stable pericardial effusion.   Mediastinum/Nodes: No pathologically enlarged lymph nodes identified.   Lungs/Pleura: Partially loculated moderate right pleural effusion. Adjacent airspace opacities. Mosaic attenuation of the bilateral lungs.   Upper Abdomen: Partially visualized mass in the left hemiabdomen compatible with known ovarian dermoid small volume ascites. Enhancing 7 mm focus in the right lobe of the liver on image 143/2 not identified on CT abdomen pelvis  May 30, 2023.   Musculoskeletal: Spinal dysmorphism with findings compatible with caudal regression syndrome.   IMPRESSION: 1. Partially loculated moderate right pleural effusion with adjacent airspace opacities, likely atelectasis. 2. Mosaic attenuation of the bilateral lungs, which can be seen in the setting of small airways disease. 3. Enhancing 7 mm focus in the right lobe of the liver not identified on CT abdomen pelvis May 30, 2023, possibly reflecting a flash filling hemangioma. Consider further evaluation with nonemergent hepatic protocol MRI with and without contrast. 4. Partially visualized mass in the left hemiabdomen compatible with known ovarian dermoid. 5. Small volume ascites. 6. Aortic atherosclerosis.   Aortic Atherosclerosis (ICD10-I70.0).     Electronically Signed   By: Reyes Holder M.D.   On: 03/22/2024 13:54 I personally reviewed the CT images.  Moderate right pleural effusion similar in appearance to CT from June.  Impression: [name] is a 33 year old woman with a complicated medical history including spina bifida, caudal regression syndrome, end-stage renal disease on hemodialysis, severe mitral stenosis, congestive heart failure, obstructive sleep apnea, hypertension, and reflux.  Recurrent right pleural effusion-likely due to congestive heart failure and renal failure.  Did have an elevated LDH previously.  She is relatively asymptomatic and would be an extraordinarily high risk surgical patient.  I would not recommend surgery at this time.  My recommendation would be to do a thoracentesis to see if the fluid can be drained again and then give a trial steroid since her LDH was elevated previously.  Optimize medical management.  Alternative consideration to surgery would be to place a catheter and do a talc pleurodesis.  Plan: Ultrasound-guided thoracentesis Prednisone  taper (prescription sent) Return in 6 weeks with PA lateral chest  x-ray  Jeanne JAYSON Millers, MD [facility] [phone]

## 2024-04-08 DIAGNOSIS — N2581 Secondary hyperparathyroidism of renal origin: Secondary | ICD-10-CM | POA: Diagnosis not present

## 2024-04-08 DIAGNOSIS — N186 End stage renal disease: Secondary | ICD-10-CM | POA: Diagnosis not present

## 2024-04-08 DIAGNOSIS — Z992 Dependence on renal dialysis: Secondary | ICD-10-CM | POA: Diagnosis not present

## 2024-04-11 DIAGNOSIS — Z992 Dependence on renal dialysis: Secondary | ICD-10-CM | POA: Diagnosis not present

## 2024-04-11 DIAGNOSIS — N2581 Secondary hyperparathyroidism of renal origin: Secondary | ICD-10-CM | POA: Diagnosis not present

## 2024-04-11 DIAGNOSIS — N186 End stage renal disease: Secondary | ICD-10-CM | POA: Diagnosis not present

## 2024-04-13 DIAGNOSIS — Z992 Dependence on renal dialysis: Secondary | ICD-10-CM | POA: Diagnosis not present

## 2024-04-13 DIAGNOSIS — N2581 Secondary hyperparathyroidism of renal origin: Secondary | ICD-10-CM | POA: Diagnosis not present

## 2024-04-13 DIAGNOSIS — N186 End stage renal disease: Secondary | ICD-10-CM | POA: Diagnosis not present

## 2024-04-14 ENCOUNTER — Other Ambulatory Visit: Payer: Self-pay | Admitting: Thoracic Surgery (Cardiothoracic Vascular Surgery)

## 2024-04-14 ENCOUNTER — Ambulatory Visit (HOSPITAL_COMMUNITY)
Admission: RE | Admit: 2024-04-14 | Discharge: 2024-04-14 | Disposition: A | Discharge status: Home / Self Care | Source: Ambulatory Visit | Attending: Thoracic Surgery (Cardiothoracic Vascular Surgery) | Admitting: Thoracic Surgery (Cardiothoracic Vascular Surgery)

## 2024-04-14 DIAGNOSIS — J90 Pleural effusion, not elsewhere classified: Secondary | ICD-10-CM

## 2024-04-14 MED ORDER — LIDOCAINE-EPINEPHRINE 1 %-1:100000 IJ SOLN
INTRAMUSCULAR | Status: AC
  Filled 2024-04-14: qty 1

## 2024-04-15 DIAGNOSIS — N186 End stage renal disease: Secondary | ICD-10-CM | POA: Diagnosis not present

## 2024-04-15 DIAGNOSIS — Z992 Dependence on renal dialysis: Secondary | ICD-10-CM | POA: Diagnosis not present

## 2024-04-15 DIAGNOSIS — N2581 Secondary hyperparathyroidism of renal origin: Secondary | ICD-10-CM | POA: Diagnosis not present

## 2024-04-18 DIAGNOSIS — N2581 Secondary hyperparathyroidism of renal origin: Secondary | ICD-10-CM | POA: Diagnosis not present

## 2024-04-18 DIAGNOSIS — N186 End stage renal disease: Secondary | ICD-10-CM | POA: Diagnosis not present

## 2024-04-18 DIAGNOSIS — Z992 Dependence on renal dialysis: Secondary | ICD-10-CM | POA: Diagnosis not present

## 2024-04-20 ENCOUNTER — Telehealth: Payer: Self-pay

## 2024-04-20 DIAGNOSIS — N2581 Secondary hyperparathyroidism of renal origin: Secondary | ICD-10-CM | POA: Diagnosis not present

## 2024-04-20 DIAGNOSIS — N186 End stage renal disease: Secondary | ICD-10-CM | POA: Diagnosis not present

## 2024-04-20 DIAGNOSIS — Z992 Dependence on renal dialysis: Secondary | ICD-10-CM | POA: Diagnosis not present

## 2024-04-20 NOTE — Telephone Encounter (Signed)
 I ran a 53 report Yulieth, Carrender 04/20/2023 - 04/18/2024 Patient ID: [id number] DOB: 11/24/1990 Age: 33 years [facility] [address] [place] , [address] Phone: [phone] Fax: [phone] Email: [email or web address] Compliance Report Compliance Payor Standard Usage 04/20/2023 - 04/18/2024 Usage days 33/365 days (9%) >= 4 hours 30 days (8%) < 4 hours 3 days (1%) Usage hours 208 hours 3 minutes Average usage (total days) 34 minutes Average usage (days used) 6 hours 18 minutes Median usage (days used) 6 hours 32 minutes Total used hours (value since last reset - 04/18/2024) 212 hours AirCurve 10 VAuto Serial number [id number] Mode Spont IPAP 13 cmH2O EPAP 9 cmH2O Easy-Breathe On Therapy Leaks - L/min Median: 0.0 95th percentile: 3.1 Maximum: 19.6 Events per hour AI: 1.8 HI: 0.1 AHI: 1.9 Apnea Index Central: 0.0 Obstructive: 1.7 Unknown: 0.1 Usage - hours Printed on 04/20/2024 - ResMed AirView version 4.49.0-5.0 Page 1 of 1

## 2024-04-21 DIAGNOSIS — Z1331 Encounter for screening for depression: Secondary | ICD-10-CM | POA: Diagnosis not present

## 2024-04-21 DIAGNOSIS — N186 End stage renal disease: Secondary | ICD-10-CM | POA: Diagnosis not present

## 2024-04-21 DIAGNOSIS — I509 Heart failure, unspecified: Secondary | ICD-10-CM | POA: Diagnosis not present

## 2024-04-21 DIAGNOSIS — I132 Hypertensive heart and chronic kidney disease with heart failure and with stage 5 chronic kidney disease, or end stage renal disease: Secondary | ICD-10-CM | POA: Diagnosis not present

## 2024-04-22 DIAGNOSIS — N2581 Secondary hyperparathyroidism of renal origin: Secondary | ICD-10-CM | POA: Diagnosis not present

## 2024-04-22 DIAGNOSIS — Z992 Dependence on renal dialysis: Secondary | ICD-10-CM | POA: Diagnosis not present

## 2024-04-22 DIAGNOSIS — N186 End stage renal disease: Secondary | ICD-10-CM | POA: Diagnosis not present

## 2024-04-24 NOTE — Progress Notes (Unsigned)
 SLEEP MEDICINE VIRTUAL CONSULT NOTE via Video Note   Because of [name]'s co-morbid illnesses, she is at least at moderate risk for complications without adequate follow up.  This format is felt to be most appropriate for this patient at this time.  All issues noted in this document were discussed and addressed.  A limited physical exam was performed with this format.  Please refer to the patient's chart for her consent to telehealth for [facility].      Date:  04/24/2024   ID:  [name] Matt, DOB [birth year], MRN [id number] The patient was identified using 2 identifiers.  Patient Location: Home Provider Location: Home Office   PCP:  [facility]    [facility] Providers Cardiologist:  Soyla Haynes Merck, MD     Evaluation Performed:  New Patient Evaluation  Chief Complaint:  OSA  History of Present Illness:    [name] is a 33 y.o. female who is being seen today for the evaluation of OSA at the request of Ezra Shuck, MD.  [name] is a 33 y.o. female with a hx of asthma, depression, spina bifida, ESRD on HD, GER, HTN, chronic diastolic CHF/RV failiure, mitral stenosis followed by AHF clinic.  She mentioned that she would gasp for breath during sleep and had problems with snoring.  A HST was ordered showing Severe OSA with an AHI of 70.2/hr, mild to moderate CSA with pAHIc 14.4/hr and nocturnal hypoxemia with O2 sat nadir 60% and O2 sats <88% for 239 minutes along with severe snoring.   She underwent CPAP titration but due to complex sleep apnea with ongoing respiratory events she went on to BiPAp titration and ultimately was started on ResMed auto BiPAP at 13/9cm H2O with an Airfit F10 mask. She is now referred for sleep medicine consultation for treatment of OSA>  She is doing well with her PAP device and thinks that she has gotten used to it.  SHe tolerates the mask and feels the pressure is adequate.  Since going on  PAP she feels rested in the am and has no significant daytime sleepiness.  She denies any significant mouth or nasal dryness or nasal congestion.  She does not think that he snores. Patient denies any episodes of bruxism, restless legs, No gagging hallucinations or cataplectic events.     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   Depression 05/14/2015   Dialysis care    ESRD (end stage renal disease) on dialysis [facility])    GERD (gastroesophageal reflux disease) 01/07/2017   HTN (hypertension) 05/02/2011   Murmur, cardiac 07/17/2022   OSA (obstructive sleep apnea) 10/26/2023   Pneumonia, unspecified organism 07/27/2023   Spina bifida    UTI (lower urinary tract infection)    Viral URI with cough 09/15/2022   Past Surgical History:  Procedure Laterality Date   A/V FISTULAGRAM Left 10/14/2021   Procedure: A/V Fistulagram;  Surgeon: Sheree Penne Bruckner, MD;  Location: [facility] INVASIVE CV LAB;  Service: Cardiovascular;  Laterality: Left;   AV FISTULA PLACEMENT     left arm   INSERTION OF DIALYSIS CATHETER N/A 05/08/2020   Procedure: ATTEMPTED, UNSUCCESSFUL INSERTION OF DIALYSIS CATHETER TUNNELED RIGHT INTERNAL JUGULAR;  Surgeon: Kallie Manuelita BROCKS, MD;  Location: [facility];  Service: General;  Laterality: N/A;   IR FLUORO GUIDE CV LINE RIGHT  05/09/2020   IR PERC PLEURAL DRAIN W/INDWELL CATH W/IMG GUIDE  01/01/2024   IR US  GUIDE VASC ACCESS RIGHT  05/09/2020   RIGHT HEART CATH N/A 06/30/2023   Procedure: RIGHT HEART CATH;  Surgeon: Rolan Ezra RAMAN, MD;  Location: [facility] INVASIVE CV LAB;  Service: Cardiovascular;  Laterality: N/A;     No outpatient medications have been marked as taking for the 04/25/24 encounter (Appointment) with Shlomo Wilbert SAUNDERS, MD.     Allergies:   Ciprofloxacin ; Other; Peanut -containing drug products; Aleve [naproxen  sodium]; Ceftriaxone ; Coconut (cocos nucifera); Influenza vaccines; Tetanus toxoid, adsorbed; Tetanus toxoid-containing vaccines; and Latex   Social History   Tobacco Use   Smoking status: Every Day    Current packs/day: 0.20    Average packs/day: 0.2 packs/day for 14.0 years (2.8 ttl pk-yrs)    Types: Cigarettes    Passive exposure: Current   Smokeless tobacco: Never   Tobacco comments:    Pt is no longer smoking cigarettes, but smokes marijuana once a day  Substance Use Topics   Alcohol use: No   Drug use: Yes    Types: Marijuana     Family Hx: The patient's family history includes Arthritis in her maternal grandmother; Breast cancer in her maternal aunt; Hypertension in her maternal grandmother; Kidney cancer in an other family member. There is no history of Colon cancer.  ROS:   Please see the history of present illness.     All other systems reviewed and are negative.   Prior Sleep studies:   The following studies were reviewed today:  HST/CPAP titration/BiPAP titration/PAP compliance download  Labs/Other Tests and Data Reviewed:     Recent Labs: 07/23/2023: Magnesium  1.9 12/30/2023: ALT 16 01/04/2024: BUN 49; Creatinine, Ser 7.81; Potassium 4.5; Sodium 136 01/12/2024: B Natriuretic Peptide 1,946.9; Hemoglobin 10.6; Platelets 137    Wt Readings from Last 3 Encounters:  04/07/24 70 lb (31.8 kg)  03/03/24 70 lb 12.8 oz (32.1 kg)  02/20/24 70 lb 1.7 oz (31.8 kg)     Risk Assessment/Calculations:          Objective:    Vital Signs:  There were no vitals taken for this visit.  Well nourished, well developed female in no acute distress. Well appearing, alert and conversant, regular work of breathing,  good skin color  Eyes- anicteric mouth- oral mucosa is pink  neuro- grossly intact skin- no apparent rash or lesions or cyanosis ASSESSMENT & PLAN:    OSA - The patient is tolerating PAP therapy well without any problems. The PAP download performed by his DME  was personally reviewed and interpreted by me today and showed an AHI of 1.8/hr on BiPAP at 13/9 cm H2O with 17% compliance in using more than 4 hours nightly.  The patient has been using and benefiting from PAP use and will continue to benefit from therapy.  -encouraged her to be more compliant and try to use her device at least 5 hours nightly  HTN - BP controlled on exam today -continue toprol  XL 100mg  daily with PRN refills   Time:   Today, I have spent 20 minutes with the patient with telehealth technology discussing the above problems.     Medication Adjustments/Labs and Tests Ordered: Current medicines are reviewed at length with the patient today.  Concerns regarding medicines are outlined above.   Tests Ordered: No orders of the defined types were placed in this encounter.   Medication  Changes: No orders of the defined types were placed in this encounter.   Follow Up:   in 1 year(s)  Signed, Wilbert Bihari, MD  04/24/2024 4:36 PM    [facility]

## 2024-04-25 ENCOUNTER — Ambulatory Visit: Attending: Cardiology | Admitting: Cardiology

## 2024-04-25 VITALS — BP 123/80 | HR 116 | Wt 71.0 lb

## 2024-04-25 DIAGNOSIS — N2581 Secondary hyperparathyroidism of renal origin: Secondary | ICD-10-CM | POA: Diagnosis not present

## 2024-04-25 DIAGNOSIS — I10 Essential (primary) hypertension: Secondary | ICD-10-CM | POA: Diagnosis not present

## 2024-04-25 DIAGNOSIS — Z992 Dependence on renal dialysis: Secondary | ICD-10-CM | POA: Diagnosis not present

## 2024-04-25 DIAGNOSIS — G4733 Obstructive sleep apnea (adult) (pediatric): Secondary | ICD-10-CM | POA: Diagnosis not present

## 2024-04-25 DIAGNOSIS — N186 End stage renal disease: Secondary | ICD-10-CM | POA: Diagnosis not present

## 2024-04-25 NOTE — Patient Instructions (Signed)
 Medication Instructions:  Your physician recommends that you continue on your current medications as directed. Please refer to the Current Medication list given to you today.  *If you need a refill on your cardiac medications before your next appointment, please call your pharmacy*  Lab Work: None.  If you have labs (blood work) drawn today and your tests are completely normal, you will receive your results only by: MyChart Message (if you have MyChart) OR A paper copy in the mail If you have any lab test that is abnormal or we need to change your treatment, we will call you to review the results.  Testing/Procedures: None.  Follow-Up: At [facility], you and your health needs are our priority.  As part of our continuing mission to provide you with exceptional heart care, our providers are all part of one team.  This team includes your primary Cardiologist (physician) and Advanced Practice Providers or APPs (Physician Assistants and Nurse Practitioners) who all work together to provide you with the care you need, when you need it.  Your next appointment will be virtual and it will be on 06/17/24  at 9 AM. Please let us  know if that won't work for you. You can call our main phone # and change it at any time. Phone number is [phone]. You can also request to change your appointment over MyChart.    Provider:   Dr. Wilbert Bihari, MD

## 2024-04-26 ENCOUNTER — Telehealth: Payer: Self-pay | Admitting: *Deleted

## 2024-04-26 DIAGNOSIS — I10 Essential (primary) hypertension: Secondary | ICD-10-CM

## 2024-04-26 DIAGNOSIS — I5022 Chronic systolic (congestive) heart failure: Secondary | ICD-10-CM

## 2024-04-26 DIAGNOSIS — G4733 Obstructive sleep apnea (adult) (pediatric): Secondary | ICD-10-CM

## 2024-04-26 NOTE — Telephone Encounter (Signed)
-----   Message from Wilbert Bihari sent at 04/25/2024  9:10 AM EDT ----- She is not tolerating the nasal pillow mask due to blisters in her nose.  Please get an appt with DME for mask fitting with nasal mask or nasal cushion mask.

## 2024-04-26 NOTE — Telephone Encounter (Signed)
Order placed to [facility] via community message.

## 2024-04-27 DIAGNOSIS — I120 Hypertensive chronic kidney disease with stage 5 chronic kidney disease or end stage renal disease: Secondary | ICD-10-CM | POA: Diagnosis not present

## 2024-04-27 DIAGNOSIS — N186 End stage renal disease: Secondary | ICD-10-CM | POA: Diagnosis not present

## 2024-04-27 DIAGNOSIS — Z992 Dependence on renal dialysis: Secondary | ICD-10-CM | POA: Diagnosis not present

## 2024-04-27 DIAGNOSIS — N2581 Secondary hyperparathyroidism of renal origin: Secondary | ICD-10-CM | POA: Diagnosis not present

## 2024-04-29 DIAGNOSIS — Z992 Dependence on renal dialysis: Secondary | ICD-10-CM | POA: Diagnosis not present

## 2024-04-29 DIAGNOSIS — N186 End stage renal disease: Secondary | ICD-10-CM | POA: Diagnosis not present

## 2024-04-29 DIAGNOSIS — N2581 Secondary hyperparathyroidism of renal origin: Secondary | ICD-10-CM | POA: Diagnosis not present

## 2024-04-29 DIAGNOSIS — G4733 Obstructive sleep apnea (adult) (pediatric): Secondary | ICD-10-CM | POA: Diagnosis not present

## 2024-05-02 DIAGNOSIS — N2581 Secondary hyperparathyroidism of renal origin: Secondary | ICD-10-CM | POA: Diagnosis not present

## 2024-05-02 DIAGNOSIS — N186 End stage renal disease: Secondary | ICD-10-CM | POA: Diagnosis not present

## 2024-05-02 DIAGNOSIS — Z992 Dependence on renal dialysis: Secondary | ICD-10-CM | POA: Diagnosis not present

## 2024-05-04 DIAGNOSIS — Z992 Dependence on renal dialysis: Secondary | ICD-10-CM | POA: Diagnosis not present

## 2024-05-04 DIAGNOSIS — N186 End stage renal disease: Secondary | ICD-10-CM | POA: Diagnosis not present

## 2024-05-04 DIAGNOSIS — N2581 Secondary hyperparathyroidism of renal origin: Secondary | ICD-10-CM | POA: Diagnosis not present

## 2024-05-06 DIAGNOSIS — N2581 Secondary hyperparathyroidism of renal origin: Secondary | ICD-10-CM | POA: Diagnosis not present

## 2024-05-06 DIAGNOSIS — Z992 Dependence on renal dialysis: Secondary | ICD-10-CM | POA: Diagnosis not present

## 2024-05-06 DIAGNOSIS — N186 End stage renal disease: Secondary | ICD-10-CM | POA: Diagnosis not present

## 2024-05-09 DIAGNOSIS — N2581 Secondary hyperparathyroidism of renal origin: Secondary | ICD-10-CM | POA: Diagnosis not present

## 2024-05-09 DIAGNOSIS — N186 End stage renal disease: Secondary | ICD-10-CM | POA: Diagnosis not present

## 2024-05-09 DIAGNOSIS — Z992 Dependence on renal dialysis: Secondary | ICD-10-CM | POA: Diagnosis not present

## 2024-05-11 DIAGNOSIS — N2581 Secondary hyperparathyroidism of renal origin: Secondary | ICD-10-CM | POA: Diagnosis not present

## 2024-05-11 DIAGNOSIS — N186 End stage renal disease: Secondary | ICD-10-CM | POA: Diagnosis not present

## 2024-05-11 DIAGNOSIS — Z992 Dependence on renal dialysis: Secondary | ICD-10-CM | POA: Diagnosis not present

## 2024-05-13 ENCOUNTER — Other Ambulatory Visit: Payer: Self-pay | Admitting: Thoracic Surgery (Cardiothoracic Vascular Surgery)

## 2024-05-13 DIAGNOSIS — N2581 Secondary hyperparathyroidism of renal origin: Secondary | ICD-10-CM | POA: Diagnosis not present

## 2024-05-13 DIAGNOSIS — Z992 Dependence on renal dialysis: Secondary | ICD-10-CM | POA: Diagnosis not present

## 2024-05-13 DIAGNOSIS — J90 Pleural effusion, not elsewhere classified: Secondary | ICD-10-CM

## 2024-05-13 DIAGNOSIS — N186 End stage renal disease: Secondary | ICD-10-CM | POA: Diagnosis not present

## 2024-05-16 DIAGNOSIS — N2581 Secondary hyperparathyroidism of renal origin: Secondary | ICD-10-CM | POA: Diagnosis not present

## 2024-05-16 DIAGNOSIS — Z992 Dependence on renal dialysis: Secondary | ICD-10-CM | POA: Diagnosis not present

## 2024-05-16 DIAGNOSIS — N186 End stage renal disease: Secondary | ICD-10-CM | POA: Diagnosis not present

## 2024-05-17 ENCOUNTER — Ambulatory Visit: Admitting: Thoracic Surgery (Cardiothoracic Vascular Surgery)

## 2024-05-17 ENCOUNTER — Ambulatory Visit (HOSPITAL_COMMUNITY)

## 2024-05-18 DIAGNOSIS — N2581 Secondary hyperparathyroidism of renal origin: Secondary | ICD-10-CM | POA: Diagnosis not present

## 2024-05-18 DIAGNOSIS — N186 End stage renal disease: Secondary | ICD-10-CM | POA: Diagnosis not present

## 2024-05-18 DIAGNOSIS — Z992 Dependence on renal dialysis: Secondary | ICD-10-CM | POA: Diagnosis not present

## 2024-05-19 ENCOUNTER — Telehealth: Payer: Self-pay | Admitting: *Deleted

## 2024-05-19 NOTE — Telephone Encounter (Signed)
 Patient has been notified that she continues to have significant nocturnal hypoxemia -on cpap & 4 liters.  Per Dr Shlomo, verify with the pt. That she is using 02 with her cpap the night of her study. ...(No she did not)  Please get a download.   Reached out to patient and she states she stopped using her cpap because she was having blisters in her nose. She was in the process of trying to get a new mask but her medicaid stopped paying because of non compliance.

## 2024-05-20 DIAGNOSIS — N2581 Secondary hyperparathyroidism of renal origin: Secondary | ICD-10-CM | POA: Diagnosis not present

## 2024-05-20 DIAGNOSIS — Z992 Dependence on renal dialysis: Secondary | ICD-10-CM | POA: Diagnosis not present

## 2024-05-20 DIAGNOSIS — N186 End stage renal disease: Secondary | ICD-10-CM | POA: Diagnosis not present

## 2024-05-23 DIAGNOSIS — N186 End stage renal disease: Secondary | ICD-10-CM | POA: Diagnosis not present

## 2024-05-23 DIAGNOSIS — N2581 Secondary hyperparathyroidism of renal origin: Secondary | ICD-10-CM | POA: Diagnosis not present

## 2024-05-23 DIAGNOSIS — Z992 Dependence on renal dialysis: Secondary | ICD-10-CM | POA: Diagnosis not present

## 2024-05-25 DIAGNOSIS — N186 End stage renal disease: Secondary | ICD-10-CM | POA: Diagnosis not present

## 2024-05-25 DIAGNOSIS — Z992 Dependence on renal dialysis: Secondary | ICD-10-CM | POA: Diagnosis not present

## 2024-05-25 DIAGNOSIS — N2581 Secondary hyperparathyroidism of renal origin: Secondary | ICD-10-CM | POA: Diagnosis not present

## 2024-05-27 DIAGNOSIS — N2581 Secondary hyperparathyroidism of renal origin: Secondary | ICD-10-CM | POA: Diagnosis not present

## 2024-05-27 DIAGNOSIS — Z992 Dependence on renal dialysis: Secondary | ICD-10-CM | POA: Diagnosis not present

## 2024-05-27 DIAGNOSIS — N186 End stage renal disease: Secondary | ICD-10-CM | POA: Diagnosis not present

## 2024-05-28 DIAGNOSIS — I120 Hypertensive chronic kidney disease with stage 5 chronic kidney disease or end stage renal disease: Secondary | ICD-10-CM | POA: Diagnosis not present

## 2024-05-28 DIAGNOSIS — Z992 Dependence on renal dialysis: Secondary | ICD-10-CM | POA: Diagnosis not present

## 2024-05-28 DIAGNOSIS — N186 End stage renal disease: Secondary | ICD-10-CM | POA: Diagnosis not present

## 2024-05-30 DIAGNOSIS — R52 Pain, unspecified: Secondary | ICD-10-CM | POA: Diagnosis not present

## 2024-05-30 DIAGNOSIS — N2581 Secondary hyperparathyroidism of renal origin: Secondary | ICD-10-CM | POA: Diagnosis not present

## 2024-05-30 DIAGNOSIS — I130 Hypertensive heart and chronic kidney disease with heart failure and stage 1 through stage 4 chronic kidney disease, or unspecified chronic kidney disease: Secondary | ICD-10-CM | POA: Diagnosis not present

## 2024-05-30 DIAGNOSIS — F322 Major depressive disorder, single episode, severe without psychotic features: Secondary | ICD-10-CM | POA: Diagnosis not present

## 2024-05-30 DIAGNOSIS — Z992 Dependence on renal dialysis: Secondary | ICD-10-CM | POA: Diagnosis not present

## 2024-05-30 DIAGNOSIS — N186 End stage renal disease: Secondary | ICD-10-CM | POA: Diagnosis not present

## 2024-06-01 ENCOUNTER — Encounter: Payer: Self-pay | Admitting: Cardiology

## 2024-06-01 ENCOUNTER — Telehealth: Payer: Self-pay

## 2024-06-01 DIAGNOSIS — Z992 Dependence on renal dialysis: Secondary | ICD-10-CM | POA: Diagnosis not present

## 2024-06-01 DIAGNOSIS — N186 End stage renal disease: Secondary | ICD-10-CM | POA: Diagnosis not present

## 2024-06-01 DIAGNOSIS — R52 Pain, unspecified: Secondary | ICD-10-CM | POA: Diagnosis not present

## 2024-06-01 DIAGNOSIS — N2581 Secondary hyperparathyroidism of renal origin: Secondary | ICD-10-CM | POA: Diagnosis not present

## 2024-06-01 NOTE — Telephone Encounter (Signed)
 SABRA

## 2024-06-03 DIAGNOSIS — R52 Pain, unspecified: Secondary | ICD-10-CM | POA: Diagnosis not present

## 2024-06-03 DIAGNOSIS — N186 End stage renal disease: Secondary | ICD-10-CM | POA: Diagnosis not present

## 2024-06-03 DIAGNOSIS — N2581 Secondary hyperparathyroidism of renal origin: Secondary | ICD-10-CM | POA: Diagnosis not present

## 2024-06-03 DIAGNOSIS — Z992 Dependence on renal dialysis: Secondary | ICD-10-CM | POA: Diagnosis not present

## 2024-06-06 DIAGNOSIS — R52 Pain, unspecified: Secondary | ICD-10-CM | POA: Diagnosis not present

## 2024-06-06 DIAGNOSIS — N2581 Secondary hyperparathyroidism of renal origin: Secondary | ICD-10-CM | POA: Diagnosis not present

## 2024-06-06 DIAGNOSIS — N186 End stage renal disease: Secondary | ICD-10-CM | POA: Diagnosis not present

## 2024-06-06 DIAGNOSIS — Z992 Dependence on renal dialysis: Secondary | ICD-10-CM | POA: Diagnosis not present

## 2024-06-07 ENCOUNTER — Encounter: Payer: Self-pay | Admitting: Thoracic Surgery (Cardiothoracic Vascular Surgery)

## 2024-06-07 ENCOUNTER — Ambulatory Visit (INDEPENDENT_AMBULATORY_CARE_PROVIDER_SITE_OTHER): Admitting: Thoracic Surgery (Cardiothoracic Vascular Surgery)

## 2024-06-07 ENCOUNTER — Ambulatory Visit: Attending: Cardiology | Admitting: Cardiology

## 2024-06-07 ENCOUNTER — Other Ambulatory Visit: Payer: Self-pay | Admitting: Thoracic Surgery (Cardiothoracic Vascular Surgery)

## 2024-06-07 ENCOUNTER — Ambulatory Visit (HOSPITAL_COMMUNITY)
Admission: RE | Admit: 2024-06-07 | Discharge: 2024-06-07 | Disposition: A | Discharge status: Home / Self Care | Source: Ambulatory Visit | Attending: Cardiovascular Disease | Admitting: Cardiovascular Disease

## 2024-06-07 VITALS — BP 124/79 | HR 102

## 2024-06-07 DIAGNOSIS — J90 Pleural effusion, not elsewhere classified: Secondary | ICD-10-CM | POA: Insufficient documentation

## 2024-06-07 NOTE — Progress Notes (Signed)
 This encounter was created in error - please disregard.

## 2024-06-07 NOTE — Progress Notes (Signed)
 [address] [place] [address]             [phone]    HPI: Ms. Bulger returns for follow-up of her right pleural effusion.  [name] is a 33 year old woman with a complicated medical history including spina bifida, caudal regression syndrome, end-stage renal disease on hemodialysis, severe mitral stenosis, congestive heart failure, obstructive sleep apnea, hypertension, and reflux.  She presented with shortness of breath in June.  CT showed a moderately large right pleural effusion.  A pigtail catheter was placed and the effusion resolved.  She had a follow-up CT in August which showed a moderate right pleural effusion very similar in appearance to her previous one.  She had an occasional cough but otherwise was asymptomatic.  I saw her on 04/07/2024.  I recommended a thoracentesis followed by a prednisone  taper to see if we could avoid surgical intervention or need for another tube.  She went to radiology but ultrasound did not show significant fluid posteriorly so no intervention was performed.  She did not take the prednisone  since she had not had the thoracentesis.  Currently she denies shortness of breath.  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   Depression 05/14/2015   Dialysis care    ESRD (end stage renal disease) on dialysis [facility])    GERD (gastroesophageal reflux disease) 01/07/2017   HTN (hypertension) 05/02/2011   Murmur, cardiac 07/17/2022   OSA (obstructive sleep apnea) 10/26/2023   Pneumonia, unspecified organism 07/27/2023   Spina bifida    UTI (lower urinary tract infection)    Viral URI with cough 09/15/2022    Current Outpatient Medications  Medication Sig Dispense Refill   albuterol  (PROVENTIL ) (2.5 MG/3ML) 0.083% nebulizer solution Take 3 mLs (2.5 mg total)  by nebulization every 6 (six) hours as needed for wheezing or shortness of breath. 150 mL 1   albuterol  (VENTOLIN  HFA) 108 (90 Base) MCG/ACT inhaler Inhale 1-2 puffs into the lungs every 6 (six) hours as needed for wheezing or shortness of breath. 1 each 0   AURYXIA  1 GM 210 MG(Fe) tablet Take 420 mg by mouth 2 (two) times daily with a meal.  3   B Complex-C-Folic Acid  (DIALYVITE  800) 0.8 MG TABS Take 1 tablet by mouth daily.     EPINEPHrine  0.3 mg/0.3 mL IJ SOAJ injection Inject 0.3 mg into the muscle as needed for anaphylaxis. 2 each 1   metoprolol  succinate (TOPROL -XL) 100 MG 24 hr tablet Take 100 mg by mouth daily.     omeprazole (PRILOSEC) 40 MG capsule Take 40 mg by mouth in the morning and at bedtime.     ondansetron  (ZOFRAN ) 4 MG tablet Take 4 mg by mouth every 8 (eight) hours as needed for nausea or vomiting.     No current facility-administered medications for this visit.    Physical Exam BP 124/79 (BP Location: Right Arm, Patient Position: Sitting, Cuff Size: Normal)   Pulse (!) 102   SpO2 94% Comment: RA 33 year old woman in no acute distress in wheelchair Skeletal deformities Lungs clear posteriorly bilaterally, decreased breath sounds right lateral Cardiac regular with a 3/6 systolic and diastolic murmur   Diagnostic Tests: CHEST - 2 VIEW   COMPARISON:  Chest radiograph dated 03/03/2024.   FINDINGS: No significant interval  change in the right mid lung field rounded opacity compared to prior radiograph. No pneumothorax. Several cardiomegaly. No acute osseous pathology.   IMPRESSION: No significant interval change in the right mid lung field rounded opacity.     Electronically Signed   By: Vanetta Chou M.D.   On: 06/07/2024 16:17 I personally reviewed the chest x-ray images.  Minimal change from 03/03/2024 with a loculated pleural fluid anterior laterally mid lung zone.  Impression: [name] is a 33 year old woman with a complicated medical history  including spina bifida, caudal regression syndrome, end-stage renal disease on hemodialysis, severe mitral stenosis, congestive heart failure, obstructive sleep apnea, hypertension, and reflux.  Right pleural effusion-definitely improved compared to her most recent CT but about the same as her last chest x-ray in August.  There is some loculated fluid in the mid lung zone.  Hard to tell from a plain radiograph exactly how much fluid there is.  We discussed options including no intervention, percutaneous tube placement, or trial of steroids and repeat CT scan.  She and her father both had questions about what happens if we do not have any intervention.  I told him she would be at high risk for pneumonia and would have permanent loss of some lung function although it is not terribly symptomatic for her right now.  We will plan to do a prednisone  taper and then I will see her back in about 3 weeks with a repeat CT of the chest to see just how much fluid remains and whether intervention is warranted.  Plan: Prednisone  taper Return in 3 weeks with CT chest to follow-up loculated effusion  Elspeth JAYSON Millers, MD [facility] [phone]

## 2024-06-07 NOTE — Telephone Encounter (Signed)
 Per [facility] the patient has medicaid and uses transportation. The appt has to note they they have appt with the respiratory therapist because it is not billing for an office visit and they will not pick them up.

## 2024-06-08 DIAGNOSIS — Z992 Dependence on renal dialysis: Secondary | ICD-10-CM | POA: Diagnosis not present

## 2024-06-08 DIAGNOSIS — N186 End stage renal disease: Secondary | ICD-10-CM | POA: Diagnosis not present

## 2024-06-08 DIAGNOSIS — N2581 Secondary hyperparathyroidism of renal origin: Secondary | ICD-10-CM | POA: Diagnosis not present

## 2024-06-08 DIAGNOSIS — R52 Pain, unspecified: Secondary | ICD-10-CM | POA: Diagnosis not present

## 2024-06-10 DIAGNOSIS — N186 End stage renal disease: Secondary | ICD-10-CM | POA: Diagnosis not present

## 2024-06-10 DIAGNOSIS — Z992 Dependence on renal dialysis: Secondary | ICD-10-CM | POA: Diagnosis not present

## 2024-06-10 DIAGNOSIS — N2581 Secondary hyperparathyroidism of renal origin: Secondary | ICD-10-CM | POA: Diagnosis not present

## 2024-06-10 DIAGNOSIS — R52 Pain, unspecified: Secondary | ICD-10-CM | POA: Diagnosis not present

## 2024-06-13 DIAGNOSIS — N2581 Secondary hyperparathyroidism of renal origin: Secondary | ICD-10-CM | POA: Diagnosis not present

## 2024-06-13 DIAGNOSIS — N186 End stage renal disease: Secondary | ICD-10-CM | POA: Diagnosis not present

## 2024-06-13 DIAGNOSIS — R52 Pain, unspecified: Secondary | ICD-10-CM | POA: Diagnosis not present

## 2024-06-13 DIAGNOSIS — Z992 Dependence on renal dialysis: Secondary | ICD-10-CM | POA: Diagnosis not present

## 2024-06-15 DIAGNOSIS — N2581 Secondary hyperparathyroidism of renal origin: Secondary | ICD-10-CM | POA: Diagnosis not present

## 2024-06-15 DIAGNOSIS — N186 End stage renal disease: Secondary | ICD-10-CM | POA: Diagnosis not present

## 2024-06-15 DIAGNOSIS — R52 Pain, unspecified: Secondary | ICD-10-CM | POA: Diagnosis not present

## 2024-06-15 DIAGNOSIS — Z992 Dependence on renal dialysis: Secondary | ICD-10-CM | POA: Diagnosis not present

## 2024-06-17 ENCOUNTER — Telehealth: Admitting: Cardiology

## 2024-06-17 DIAGNOSIS — Z992 Dependence on renal dialysis: Secondary | ICD-10-CM | POA: Diagnosis not present

## 2024-06-17 DIAGNOSIS — N2581 Secondary hyperparathyroidism of renal origin: Secondary | ICD-10-CM | POA: Diagnosis not present

## 2024-06-17 DIAGNOSIS — R52 Pain, unspecified: Secondary | ICD-10-CM | POA: Diagnosis not present

## 2024-06-17 DIAGNOSIS — N186 End stage renal disease: Secondary | ICD-10-CM | POA: Diagnosis not present

## 2024-06-19 DIAGNOSIS — R52 Pain, unspecified: Secondary | ICD-10-CM | POA: Diagnosis not present

## 2024-06-19 DIAGNOSIS — N2581 Secondary hyperparathyroidism of renal origin: Secondary | ICD-10-CM | POA: Diagnosis not present

## 2024-06-19 DIAGNOSIS — N186 End stage renal disease: Secondary | ICD-10-CM | POA: Diagnosis not present

## 2024-06-19 DIAGNOSIS — Z992 Dependence on renal dialysis: Secondary | ICD-10-CM | POA: Diagnosis not present

## 2024-06-21 ENCOUNTER — Ambulatory Visit (HOSPITAL_COMMUNITY)

## 2024-06-21 DIAGNOSIS — R52 Pain, unspecified: Secondary | ICD-10-CM | POA: Diagnosis not present

## 2024-06-21 DIAGNOSIS — N186 End stage renal disease: Secondary | ICD-10-CM | POA: Diagnosis not present

## 2024-06-21 DIAGNOSIS — Z992 Dependence on renal dialysis: Secondary | ICD-10-CM | POA: Diagnosis not present

## 2024-06-21 DIAGNOSIS — N2581 Secondary hyperparathyroidism of renal origin: Secondary | ICD-10-CM | POA: Diagnosis not present

## 2024-06-24 DIAGNOSIS — R52 Pain, unspecified: Secondary | ICD-10-CM | POA: Diagnosis not present

## 2024-06-24 DIAGNOSIS — N2581 Secondary hyperparathyroidism of renal origin: Secondary | ICD-10-CM | POA: Diagnosis not present

## 2024-06-24 DIAGNOSIS — N186 End stage renal disease: Secondary | ICD-10-CM | POA: Diagnosis not present

## 2024-06-24 DIAGNOSIS — Z992 Dependence on renal dialysis: Secondary | ICD-10-CM | POA: Diagnosis not present

## 2024-06-27 DIAGNOSIS — Z713 Dietary counseling and surveillance: Secondary | ICD-10-CM | POA: Diagnosis not present

## 2024-06-27 DIAGNOSIS — Z6838 Body mass index (BMI) 38.0-38.9, adult: Secondary | ICD-10-CM | POA: Diagnosis not present

## 2024-06-27 DIAGNOSIS — N185 Chronic kidney disease, stage 5: Secondary | ICD-10-CM | POA: Diagnosis not present

## 2024-06-27 DIAGNOSIS — I130 Hypertensive heart and chronic kidney disease with heart failure and stage 1 through stage 4 chronic kidney disease, or unspecified chronic kidney disease: Secondary | ICD-10-CM | POA: Diagnosis not present

## 2024-06-27 DIAGNOSIS — Z7182 Exercise counseling: Secondary | ICD-10-CM | POA: Diagnosis not present

## 2024-06-27 DIAGNOSIS — M79601 Pain in right arm: Secondary | ICD-10-CM | POA: Diagnosis not present

## 2024-06-27 DIAGNOSIS — E669 Obesity, unspecified: Secondary | ICD-10-CM | POA: Diagnosis not present

## 2024-06-28 ENCOUNTER — Ambulatory Visit (HOSPITAL_COMMUNITY)

## 2024-06-28 ENCOUNTER — Ambulatory Visit: Admitting: Thoracic Surgery (Cardiothoracic Vascular Surgery)

## 2024-06-30 ENCOUNTER — Ambulatory Visit (HOSPITAL_COMMUNITY)
Admission: RE | Admit: 2024-06-30 | Discharge: 2024-06-30 | Disposition: A | Source: Ambulatory Visit | Attending: Thoracic Surgery (Cardiothoracic Vascular Surgery) | Admitting: Thoracic Surgery (Cardiothoracic Vascular Surgery)

## 2024-06-30 DIAGNOSIS — J90 Pleural effusion, not elsewhere classified: Secondary | ICD-10-CM | POA: Insufficient documentation

## 2024-06-30 DIAGNOSIS — R918 Other nonspecific abnormal finding of lung field: Secondary | ICD-10-CM | POA: Diagnosis not present

## 2024-06-30 DIAGNOSIS — I3481 Nonrheumatic mitral (valve) annulus calcification: Secondary | ICD-10-CM | POA: Diagnosis not present

## 2024-06-30 DIAGNOSIS — I3139 Other pericardial effusion (noninflammatory): Secondary | ICD-10-CM | POA: Diagnosis not present

## 2024-07-12 ENCOUNTER — Other Ambulatory Visit: Payer: Self-pay | Admitting: Thoracic Surgery (Cardiothoracic Vascular Surgery)

## 2024-07-12 ENCOUNTER — Encounter: Payer: Self-pay | Admitting: Thoracic Surgery (Cardiothoracic Vascular Surgery)

## 2024-07-12 ENCOUNTER — Ambulatory Visit
Attending: Thoracic Surgery (Cardiothoracic Vascular Surgery) | Admitting: Thoracic Surgery (Cardiothoracic Vascular Surgery)

## 2024-07-12 ENCOUNTER — Encounter (HOSPITAL_COMMUNITY): Payer: Self-pay

## 2024-07-12 VITALS — BP 125/80 | HR 89 | Resp 18 | Ht <= 58 in

## 2024-07-12 DIAGNOSIS — J90 Pleural effusion, not elsewhere classified: Secondary | ICD-10-CM | POA: Insufficient documentation

## 2024-07-12 NOTE — Progress Notes (Signed)
 [address], [name] [address]             [phone]    HPI: [name] returns for scheduled follow-up visit  [name] is a 33 year old woman with a history of spina bifida, caudal regression syndrome, end-stage renal disease on hemodialysis, severe mitral stenosis, congestive heart failure, obstructive sleep apnea, hypertension, reflux, and a right pleural effusion.  She presented back in June with shortness of breath.  She was found to have a moderately large right pleural effusion.  A pigtail catheter was placed and the effusion resolved.  She had a follow-up CT in August which showed a moderate right pleural effusion.  I saw her in September.  Recommended thoracentesis and a prednisone  taper.  She went to radiology but ultrasound did not show significant fluid posteriorly and no thoracentesis was performed.  I last saw her in November.  She was not having any symptoms, but her chest x-ray showed some loculated pleural fluid.  Recommended steroid taper and repeat CT to see how large the effusion was.  She took the first 2 days of prednisone  but then stopped due to nausea.  She denies any shortness of breath, cough, wheezing, orthopnea.  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   Depression 05/14/2015   Dialysis care    ESRD (end stage renal disease) on dialysis [facility])    GERD (gastroesophageal reflux disease) 01/07/2017   HTN (hypertension) 05/02/2011   Murmur, cardiac 07/17/2022   OSA (obstructive sleep apnea) 10/26/2023   Pneumonia, unspecified organism 07/27/2023   Spina bifida    UTI (lower urinary tract infection)    Viral URI with cough 09/15/2022    Current Outpatient Medications  Medication Sig Dispense Refill   albuterol  (PROVENTIL ) (2.5 MG/3ML) 0.083% nebulizer solution  Take 3 mLs (2.5 mg total) by nebulization every 6 (six) hours as needed for wheezing or shortness of breath. 150 mL 1   albuterol  (VENTOLIN  HFA) 108 (90 Base) MCG/ACT inhaler Inhale 1-2 puffs into the lungs every 6 (six) hours as needed for wheezing or shortness of breath. 1 each 0   AURYXIA  1 GM 210 MG(Fe) tablet Take 420 mg by mouth 2 (two) times daily with a meal.  3   B Complex-C-Folic Acid  (DIALYVITE  800) 0.8 MG TABS Take 1 tablet by mouth daily.     EPINEPHrine  0.3 mg/0.3 mL IJ SOAJ injection Inject 0.3 mg into the muscle as needed for anaphylaxis. 2 each 1   metoprolol  succinate (TOPROL -XL) 100 MG 24 hr tablet Take 100 mg by mouth daily.     omeprazole (PRILOSEC) 40 MG capsule Take 40 mg by mouth in the morning and at bedtime.     ondansetron  (ZOFRAN ) 4 MG tablet Take 4 mg by mouth every 8 (eight) hours as needed for nausea or vomiting.     No current facility-administered medications for this visit.    Physical Exam BP 125/80 (BP Location: Right Arm)   Pulse 89   Resp 18   Ht 3' 1 (0.94 m)   SpO2 95% Comment: RA  BMI 36.56 kg/m  33 year old woman in no acute distress In wheelchair Alert and oriented x 3 with no focal deficits Lungs clear with equal breath sounds posteriorly Cardiac regular rate and rhythm with systolic  and diastolic murmur   Diagnostic Tests: CT CHEST WITHOUT CONTRAST   TECHNIQUE: Multidetector CT imaging of the chest was performed following the standard protocol without IV contrast.   RADIATION DOSE REDUCTION: This exam was performed according to the departmental dose-optimization program which includes automated exposure control, adjustment of the mA and/or kV according to patient size and/or use of iterative reconstruction technique.   COMPARISON:  Chest x-ray 06/07/2024, CT chest 03/22/2024, 01/02/2024   FINDINGS: Cardiovascular: Limited assessment without intravenous contrast. Nonaneurysmal aorta. Mild atherosclerosis. Mitral  annular calcifications. Mild coronary calcifications. Cardiomegaly. Small volume pericardial effusion, slightly diminished compared to prior CT.   Mediastinum/Nodes: Patent trachea. No thyroid  mass. No obvious nodes but limited without contrast. Esophagus within normal limits   Lungs/Pleura: Moderate loculated appearing right pleural effusion slightly diminished compared to the prior chest CT. Partial platelike right lung consolidation without significant change. Mild mosaic attenuation.   Upper Abdomen: No acute finding. Partially imaged dysmorphic renal tissue in the retroperitoneum.   Musculoskeletal: Sclerosis. Spinal dysraphism as seen on prior exams.   IMPRESSION: 1. Moderate loculated appearing right pleural effusion slightly diminished compared to the prior chest CT. Adjacent platelike partial right lung consolidation, favored to represent atelectasis 2. Cardiomegaly with small volume pericardial effusion, slightly diminished compared to prior CT. 3. Aortic atherosclerosis.   Aortic Atherosclerosis (ICD10-I70.0).     Electronically Signed   By: Luke Bun M.D.   On: 07/07/2024 00:16   I personally reviewed the CT images.  There is a loculated right pleural effusion.  Decreased in size from her prior CT but still with significant atelectasis of the lower lobe.  No fluid posteriorly.  Cardiomegaly.  Impression: [name] is a 33 year old woman with a history of spina bifida, caudal regression syndrome, end-stage renal disease on hemodialysis, severe mitral stenosis, congestive heart failure, obstructive sleep apnea, hypertension, reflux, and a right pleural effusion.  Loculated right pleural effusion-has definitely improved in comparison to her last CT from August.  She and her father both had questions as to whether this will continue to improve and completely resolve on its own.  I think that is unlikely, but not impossible.  I again emphasized that is not the  presence of fluid per se that is the problem since she is asymptomatic.  However, with the atelectasis she is likely to be more susceptible to pneumonia.  Options include no intervention, catheter drainage possible thrombolytics, or VATS to drain the effusion.  Of those I would favor catheter drainage to see if the effusion resolved without need for surgical intervention.  She is a poor surgical candidate due to her comorbidities.   She and her father both do not want to have any intervention at this time.  She says she would like to go back and try to finish the prednisone  first.  I did caution them that the longer she waits the more likely she would need surgery as there is more of a chance for a peel to form on the lung.  I was able to talk to her into returning in 6 weeks with a repeat CT  Plan: Return in 6 weeks with CT chest  Elspeth JAYSON Millers, MD [facility] [phone]

## 2024-07-14 ENCOUNTER — Encounter (HOSPITAL_COMMUNITY): Admission: RE | Disposition: A | Payer: Self-pay | Attending: Vascular Surgery

## 2024-07-14 ENCOUNTER — Ambulatory Visit (HOSPITAL_COMMUNITY)
Admission: RE | Admit: 2024-07-14 | Discharge: 2024-07-14 | Disposition: A | Discharge status: Home / Self Care | Attending: Vascular Surgery | Admitting: Vascular Surgery

## 2024-07-14 ENCOUNTER — Other Ambulatory Visit: Payer: Self-pay

## 2024-07-14 ENCOUNTER — Encounter (HOSPITAL_COMMUNITY): Payer: Self-pay | Admitting: Vascular Surgery

## 2024-07-14 DIAGNOSIS — Y832 Surgical operation with anastomosis, bypass or graft as the cause of abnormal reaction of the patient, or of later complication, without mention of misadventure at the time of the procedure: Secondary | ICD-10-CM | POA: Diagnosis not present

## 2024-07-14 DIAGNOSIS — Z79899 Other long term (current) drug therapy: Secondary | ICD-10-CM | POA: Insufficient documentation

## 2024-07-14 DIAGNOSIS — T82858A Stenosis of vascular prosthetic devices, implants and grafts, initial encounter: Secondary | ICD-10-CM | POA: Diagnosis not present

## 2024-07-14 DIAGNOSIS — N186 End stage renal disease: Secondary | ICD-10-CM | POA: Insufficient documentation

## 2024-07-14 DIAGNOSIS — Z87891 Personal history of nicotine dependence: Secondary | ICD-10-CM | POA: Diagnosis not present

## 2024-07-14 DIAGNOSIS — T82898A Other specified complication of vascular prosthetic devices, implants and grafts, initial encounter: Secondary | ICD-10-CM | POA: Diagnosis present

## 2024-07-14 DIAGNOSIS — I120 Hypertensive chronic kidney disease with stage 5 chronic kidney disease or end stage renal disease: Secondary | ICD-10-CM | POA: Diagnosis not present

## 2024-07-14 DIAGNOSIS — Z992 Dependence on renal dialysis: Secondary | ICD-10-CM | POA: Insufficient documentation

## 2024-07-14 HISTORY — PX: A/V FISTULAGRAM: CATH118298

## 2024-07-14 LAB — GLUCOSE, CAPILLARY: Glucose-Capillary: 87 mg/dL (ref 70–99)

## 2024-07-14 SURGERY — A/V FISTULAGRAM
Anesthesia: LOCAL | Site: Arm Upper | Laterality: Left

## 2024-07-14 MED ORDER — IODIXANOL 320 MG/ML IV SOLN
INTRAVENOUS | Status: DC | PRN
  Administered 2024-07-14: 08:00:00 20 mL via INTRAVENOUS

## 2024-07-14 MED ORDER — LIDOCAINE HCL (PF) 1 % IJ SOLN
INTRAMUSCULAR | Status: DC | PRN
  Administered 2024-07-14 (×2): 2 mL via SUBCUTANEOUS

## 2024-07-14 MED ORDER — HEPARIN (PORCINE) IN NACL 1000-0.9 UT/500ML-% IV SOLN
INTRAVENOUS | Status: DC | PRN
  Administered 2024-07-14: 08:00:00 500 mL

## 2024-07-14 MED ORDER — LIDOCAINE HCL (PF) 1 % IJ SOLN
INTRAMUSCULAR | Status: AC
  Filled 2024-07-14: qty 30

## 2024-07-14 SURGICAL SUPPLY — 5 items
KIT MICROPUNCTURE NIT STIFF (SHEATH) IMPLANT
SHEATH PROBE COVER 6X72 (BAG) IMPLANT
STOPCOCK MORSE 400PSI 3WAY (MISCELLANEOUS) IMPLANT
TRAY PV CATH (CUSTOM PROCEDURE TRAY) ×1 IMPLANT
TUBING CIL FLEX 10 FLL-RA (TUBING) IMPLANT

## 2024-07-14 NOTE — Op Note (Signed)
° ° °  Patient name: [name] MRN: [id number] DOB: 1990-12-01 Sex: female  07/14/2024 Pre-operative Diagnosis: End-stage renal disease, malfunction left arm AV fistula with increased bleeding time Post-operative diagnosis:  Same Surgeon:  Penne BROCKS. Sheree, MD Procedure Performed: 1.  Percutaneous ultrasound-guided cannulation left arm AV fistula 2.  Left upper extremity fistulogram  Indications: 33 year old female with end-stage renal disease on dialysis for many years via left arm cephalic vein fistula.  She has undergone fistulogram in the past without intervention.  She has had increased bleeding times on dialysis and is now indicated for repeat fistulogram with possible intervention.  We have discussed the risk benefits and alternatives and she agrees to proceed.  Findings: The cephalic vein is heavily calcified from the anastomosis to the artery all the way through the upper arm.  It appears the cephalic vein more cephalad fills either an IJ or EJ but the cephalic arch proper appears occluded.  Fistula is heavily calcified and is at risk of future thrombosis.  No intervention was undertaken.   Procedure:  The patient was identified in the holding area and taken to heart and vascular procedure room.  The patient was then placed supine on the table and prepped and draped in the usual sterile fashion.  A time out was called.  Ultrasound was used to evaluate the left arm AV fistula.  An area overlying 1 pseudoaneurysm was anesthetized 1% lidocaine  and the fistula was cannulated with a micropuncture needle followed by wire and sheath.  On ultrasound images saved the permanent record.  We performed a left upper extremity fistulogram imaging including retrograde imaging with the fistula compressed.  With the above findings we elected no intervention.  She will be at risk of further fistula malfunction requiring possible ligation but no intervention was undertaken today.  She tolerated the procedure  well without immediate complication.  Contrast: 20cc    Gamal Todisco C. Sheree, MD [facility] Office: [phone] Pager: [phone]

## 2024-07-14 NOTE — H&P (Signed)
 H+P  History of Present Illness: This is a 33 y.o. female history of end-stage renal disease on dialysis for many years via left arm cephalic vein fistula.  She has undergone fistulogram in the past no intervention.  She states that there has been pulsatility in the fistula for many years now having increased bleeding time on dialysis.  Fistula continues to work for full session.  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   Depression 05/14/2015   Dialysis care    ESRD (end stage renal disease) on dialysis [facility])    GERD (gastroesophageal reflux disease) 01/07/2017   HTN (hypertension) 05/02/2011   Murmur, cardiac 07/17/2022   OSA (obstructive sleep apnea) 10/26/2023   Pneumonia, unspecified organism 07/27/2023   Spina bifida    UTI (lower urinary tract infection)    Viral URI with cough 09/15/2022    Past Surgical History:  Procedure Laterality Date   A/V FISTULAGRAM Left 10/14/2021   Procedure: A/V Fistulagram;  Surgeon: Sheree Penne Bruckner, MD;  Location: [facility] INVASIVE CV LAB;  Service: Cardiovascular;  Laterality: Left;   AV FISTULA PLACEMENT     left arm   INSERTION OF DIALYSIS CATHETER N/A 05/08/2020   Procedure: ATTEMPTED, UNSUCCESSFUL INSERTION OF DIALYSIS CATHETER TUNNELED RIGHT INTERNAL JUGULAR;  Surgeon: Kallie Manuelita BROCKS, MD;  Location: [facility] ORS;  Service: General;  Laterality: N/A;   IR FLUORO GUIDE CV LINE RIGHT  05/09/2020   IR PERC PLEURAL DRAIN W/INDWELL CATH W/IMG GUIDE  01/01/2024   IR US  GUIDE VASC ACCESS RIGHT  05/09/2020   RIGHT HEART CATH N/A 06/30/2023   Procedure: RIGHT HEART CATH;  Surgeon: Rolan Ezra RAMAN, MD;  Location: [facility] INVASIVE CV LAB;  Service: Cardiovascular;  Laterality: N/A;    Allergies[1]  Prior to Admission medications  Medication Sig Start Date End Date Taking? Authorizing  Provider  albuterol  (PROVENTIL ) (2.5 MG/3ML) 0.083% nebulizer solution Take 3 mLs (2.5 mg total) by nebulization every 6 (six) hours as needed for wheezing or shortness of breath. 07/28/23   Duanne Butler DASEN, MD  albuterol  (VENTOLIN  HFA) 108 (90 Base) MCG/ACT inhaler Inhale 1-2 puffs into the lungs every 6 (six) hours as needed for wheezing or shortness of breath. 09/15/22   Kayla Jeoffrey RAMAN, FNP  AURYXIA  1 GM 210 MG(Fe) tablet Take 420 mg by mouth 2 (two) times daily with a meal. 04/15/18   [provider]  B Complex-C-Folic Acid  (DIALYVITE  800) 0.8 MG TABS Take 1 tablet by mouth daily. 12/07/23   [provider]  EPINEPHrine  0.3 mg/0.3 mL IJ SOAJ injection Inject 0.3 mg into the muscle as needed for anaphylaxis. 04/23/23   Kayla Jeoffrey RAMAN, FNP  metoprolol  succinate (TOPROL -XL) 100 MG 24 hr tablet Take 100 mg by mouth daily. 12/29/19   [provider]  omeprazole (PRILOSEC) 40 MG capsule Take 40 mg by mouth in the morning and at bedtime. 01/08/22   [provider]  ondansetron  (ZOFRAN ) 4 MG tablet Take 4 mg by mouth every 8 (eight) hours as needed for nausea or vomiting. 12/09/23   [provider]    Social History   Socioeconomic History   Marital status: Married    Spouse name: Not on file   Number of children: Not on file   Years of education: Not on file   Highest education level:  Not on file  Occupational History   Not on file  Tobacco Use   Smoking status: Every Day    Current packs/day: 0.20    Average packs/day: 0.2 packs/day for 14.0 years (2.8 ttl pk-yrs)    Types: Cigarettes    Passive exposure: Current   Smokeless tobacco: Never   Tobacco comments:    Pt is no longer smoking cigarettes, but smokes marijuana once a day  Substance and Sexual Activity   Alcohol use: No   Drug use: Yes    Types: Marijuana   Sexual activity: Never  Other Topics Concern   Not on file  Social History Narrative   Not on file   Social Drivers of Health    Tobacco Use: High Risk (07/12/2024)   Patient History    Smoking Tobacco Use: Every Day    Smokeless Tobacco Use: Never    Passive Exposure: Current  Financial Resource Strain: Not on file  Food Insecurity: No Food Insecurity (12/30/2023)   Hunger Vital Sign    Worried About Running Out of Food in the Last Year: Never true    Ran Out of Food in the Last Year: Never true  Transportation Needs: No Transportation Needs (12/30/2023)   PRAPARE - Administrator, Civil Service (Medical): No    Lack of Transportation (Non-Medical): No  Physical Activity: Not on file  Stress: Not on file  Social Connections: Not on file  Intimate Partner Violence: Not At Risk (12/30/2023)   Humiliation, Afraid, Rape, and Kick questionnaire    Fear of Current or Ex-Partner: No    Emotionally Abused: No    Physically Abused: No    Sexually Abused: No  Depression (PHQ2-9): High Risk (08/20/2023)   Depression (PHQ2-9)    PHQ-2 Score: 17  Alcohol Screen: Not on file  Housing: Low Risk (12/30/2023)   Housing Stability Vital Sign    Unable to Pay for Housing in the Last Year: No    Number of Times Moved in the Last Year: 0    Homeless in the Last Year: No  Utilities: Not At Risk (12/30/2023)   AHC Utilities    Threatened with loss of utilities: No  Health Literacy: Not on file     Family History  Problem Relation Age of Onset   Kidney cancer Other    Hypertension Maternal Grandmother    Arthritis Maternal Grandmother    Breast cancer Maternal Aunt    Colon cancer Neg Hx     ROS: [x]  Positive   [ ]  Negative   [ ]  All sytems reviewed and are negative  Cardiovascular: []  chest pain/pressure []  palpitations []  SOB lying flat []  DOE []  pain in legs while walking []  pain in legs at rest []  pain in legs at night []  non-healing ulcers []  hx of DVT []  swelling in legs  Pulmonary: []  productive cough []  asthma/wheezing []  home O2  Neurologic: []  weakness in []  arms []  legs []  numbness  in []  arms []  legs []  hx of CVA []  mini stroke [] difficulty speaking or slurred speech []  temporary loss of vision in one eye []  dizziness  Hematologic: []  hx of cancer []  bleeding problems []  problems with blood clotting easily  Endocrine:   []  diabetes []  thyroid  disease  GI []  vomiting blood []  blood in stool  GU: []  CKD/renal failure []  HD--[]  M/W/F or []  T/T/S []  burning with urination [x]  increased bleeding on dialysis  Psychiatric: []  anxiety []  depression  Musculoskeletal: []   arthritis []  joint pain  Integumentary: []  rashes []  ulcers  Constitutional: []  fever []  chills   Physical Examination  Vitals:   07/14/24 0710 07/14/24 0712  BP:  110/81  Pulse: 96   Resp: 14   SpO2: 97%    There is no height or weight on file to calculate BMI.  General:  WDWN in NAD Gait: Not observed HENT: WNL, normocephalic Pulmonary: normal non-labored breathing, without Rales, rhonchi,  wheezing Cardiac: Palpable radial pulses Abdomen:  soft, NT/ND, no masses Skin: without rashes Extremities: Left upper extremity fistula with areas of pseudoaneurysmal degeneration, no ulceration, there is pulsatility throughout the fistula Musculoskeletal: bilateral lower extremity amputee  Neurologic: A&O X 3; Appropriate Affect ; SENSATION: normal; MOTOR FUNCTION:  moving all extremities equally. Speech is fluent/normal  CBC    Component Value Date/Time   WBC 6.4 01/12/2024 0322   RBC 3.46 (L) 01/12/2024 0322   HGB 10.6 (L) 01/12/2024 0322   HCT 35.2 (L) 01/12/2024 0322   PLT 137 (L) 01/12/2024 0322   MCV 101.7 (H) 01/12/2024 0322   MCH 30.6 01/12/2024 0322   MCHC 30.1 01/12/2024 0322   RDW 15.6 (H) 01/12/2024 0322   LYMPHSABS 1.1 01/04/2024 0742   MONOABS 0.7 01/04/2024 0742   EOSABS 0.3 01/04/2024 0742   BASOSABS 0.0 01/04/2024 0742    BMET    Component Value Date/Time   NA 136 01/04/2024 0419   K 4.5 01/04/2024 0419   CL 96 (L) 01/04/2024 0419   CO2 25  01/04/2024 0419   GLUCOSE 100 (H) 01/04/2024 0419   BUN 49 (H) 01/04/2024 0419   CREATININE 7.81 (H) 01/04/2024 0419   CALCIUM  7.1 (L) 01/04/2024 0419   GFRNONAA 7 (L) 01/04/2024 0419   GFRAA 9 (L) 04/30/2020 0455    COAGS: Lab Results  Component Value Date   INR 0.93 01/22/2011     Vascular Imaging:   Previous fistulogram reviewed  ASSESSMENT/PLAN: This is a 33 y.o. female with end-stage renal disease on dialysis via left upper arm AV fistula for many years.  The fistula has pulsatility and now has increased bleeding time after dialysis with concern for central stenosis.  We have discussed her options being continued watchful waiting versus proceeding with fistulogram possible intervention and even discussed possible need for surgical intervention.  After discussing the risk benefits alternatives the patient and her family at bedside have agreed to proceed with fistulogram with possible intervention.  All questions were answered they demonstrate good understanding and consent was signed.  Kerrianne Jeng C. Sheree, MD [facility] Office: [phone] Pager: [phone]     [1]  Allergies Allergen Reactions   Ciprofloxacin  Shortness Of Breath, Nausea And Vomiting and Other (See Comments)    HIGH FEVER and oral blisters    Other Anaphylaxis    Revaclear dialzer   Peanut -Containing Drug Products Anaphylaxis   Aleve [Naproxen Sodium] Other (See Comments)    G.I.Bleed   Ceftriaxone  Other (See Comments)    Blisters in mouth    Coconut (Cocos Nucifera) Hives   Influenza Vaccines Nausea And Vomiting and Other (See Comments)    High fever   Tetanus Toxoid, Adsorbed Nausea And Vomiting and Other (See Comments)    HIGH FEVER, also   Tetanus Toxoid-Containing Vaccines Nausea And Vomiting and Other (See Comments)    HIGH FEVER   Latex Itching and Rash

## 2024-08-09 ENCOUNTER — Ambulatory Visit (HOSPITAL_COMMUNITY)

## 2024-08-16 ENCOUNTER — Ambulatory Visit (HOSPITAL_COMMUNITY)

## 2024-08-19 ENCOUNTER — Encounter (HOSPITAL_COMMUNITY): Payer: Self-pay | Admitting: Vascular Surgery

## 2024-08-23 ENCOUNTER — Ambulatory Visit (HOSPITAL_COMMUNITY)
Admission: RE | Admit: 2024-08-23 | Discharge: 2024-08-23 | Disposition: A | Discharge status: Home / Self Care | Source: Ambulatory Visit

## 2024-08-23 ENCOUNTER — Encounter (HOSPITAL_COMMUNITY): Payer: Self-pay

## 2024-08-23 ENCOUNTER — Ambulatory Visit: Admitting: Thoracic Surgery (Cardiothoracic Vascular Surgery)

## 2024-08-23 DIAGNOSIS — J90 Pleural effusion, not elsewhere classified: Secondary | ICD-10-CM | POA: Insufficient documentation

## 2024-08-24 ENCOUNTER — Other Ambulatory Visit: Payer: Self-pay

## 2024-08-24 ENCOUNTER — Encounter (HOSPITAL_COMMUNITY): Admission: RE | Disposition: A | Payer: Self-pay | Source: Home / Self Care | Attending: Vascular Surgery

## 2024-08-24 ENCOUNTER — Ambulatory Visit (HOSPITAL_COMMUNITY)
Admission: RE | Admit: 2024-08-24 | Discharge: 2024-08-24 | Disposition: A | Discharge status: Home / Self Care | Attending: Vascular Surgery | Admitting: Vascular Surgery

## 2024-08-24 DIAGNOSIS — Y832 Surgical operation with anastomosis, bypass or graft as the cause of abnormal reaction of the patient, or of later complication, without mention of misadventure at the time of the procedure: Secondary | ICD-10-CM | POA: Insufficient documentation

## 2024-08-24 DIAGNOSIS — I871 Compression of vein: Secondary | ICD-10-CM | POA: Diagnosis not present

## 2024-08-24 DIAGNOSIS — Z87891 Personal history of nicotine dependence: Secondary | ICD-10-CM | POA: Diagnosis not present

## 2024-08-24 DIAGNOSIS — N186 End stage renal disease: Secondary | ICD-10-CM | POA: Insufficient documentation

## 2024-08-24 DIAGNOSIS — T82898A Other specified complication of vascular prosthetic devices, implants and grafts, initial encounter: Secondary | ICD-10-CM | POA: Diagnosis not present

## 2024-08-24 DIAGNOSIS — I120 Hypertensive chronic kidney disease with stage 5 chronic kidney disease or end stage renal disease: Secondary | ICD-10-CM | POA: Diagnosis not present

## 2024-08-24 DIAGNOSIS — T82868A Thrombosis of vascular prosthetic devices, implants and grafts, initial encounter: Secondary | ICD-10-CM | POA: Diagnosis not present

## 2024-08-24 DIAGNOSIS — Z992 Dependence on renal dialysis: Secondary | ICD-10-CM | POA: Diagnosis not present

## 2024-08-24 DIAGNOSIS — T82858A Stenosis of vascular prosthetic devices, implants and grafts, initial encounter: Secondary | ICD-10-CM | POA: Diagnosis not present

## 2024-08-24 LAB — GLUCOSE, CAPILLARY: Glucose-Capillary: 70 mg/dL (ref 70–99)

## 2024-08-24 MED ORDER — IODIXANOL 320 MG/ML IV SOLN
INTRAVENOUS | Status: DC | PRN
  Administered 2024-08-24: 16 mL

## 2024-08-24 MED ORDER — LIDOCAINE HCL (PF) 1 % IJ SOLN
INTRAMUSCULAR | Status: AC
  Filled 2024-08-24: qty 30

## 2024-08-24 MED ORDER — LIDOCAINE HCL (PF) 1 % IJ SOLN
INTRAMUSCULAR | Status: DC | PRN
  Administered 2024-08-24: 2 mL via INTRADERMAL

## 2024-08-24 MED ORDER — HEPARIN (PORCINE) IN NACL 1000-0.9 UT/500ML-% IV SOLN
INTRAVENOUS | Status: DC | PRN
  Administered 2024-08-24: 500 mL

## 2024-08-24 NOTE — H&P (Signed)
 " HD ACCESS CENTER H&P   Patient ID: [name], female   DOB: [birth year], 34 y.o.   MRN: [id number]  Subjective:     HPI [name] is a 34 y.o. female with ESRD presenting for evaluation of HD access and consideration of intervention. Referred by: Dr. Marlee Having issues with low flows and prolonged bleeding  Past Medical History:  Diagnosis Date   Acute asthma exacerbation 07/15/2023   Anemia associated with chronic renal failure    Blood transfusion    Caudal regression syndrome    Assoc with spina bifida.   Depression 05/14/2015   Dialysis care    ESRD (end stage renal disease) on dialysis [facility])    GERD (gastroesophageal reflux disease) 01/07/2017   HTN (hypertension) 05/02/2011   Murmur, cardiac 07/17/2022   OSA (obstructive sleep apnea) 10/26/2023   Pneumonia, unspecified organism 07/27/2023   Spina bifida    UTI (lower urinary tract infection)    Viral URI with cough 09/15/2022   Family History  Problem Relation Age of Onset   Kidney cancer Other    Hypertension Maternal Grandmother    Arthritis Maternal Grandmother    Breast cancer Maternal Aunt    Colon cancer Neg Hx    Past Surgical History:  Procedure Laterality Date   A/V FISTULAGRAM Left 10/14/2021   Procedure: A/V Fistulagram;  Surgeon: Sheree Penne Bruckner, MD;  Location: [facility] INVASIVE CV LAB;  Service: Cardiovascular;  Laterality: Left;   A/V FISTULAGRAM Left 07/14/2024   Procedure: A/V Fistulagram;  Surgeon: Sheree Penne Bruckner, MD;  Location: [facility] PV LAB;  Service: Cardiovascular;  Laterality: Left;   AV FISTULA PLACEMENT     left arm   INSERTION OF DIALYSIS CATHETER N/A 05/08/2020   Procedure: ATTEMPTED, UNSUCCESSFUL INSERTION OF DIALYSIS CATHETER TUNNELED RIGHT INTERNAL JUGULAR;  Surgeon: Kallie Manuelita BROCKS, MD;  Location: [facility];  Service: General;  Laterality: N/A;   IR FLUORO GUIDE CV LINE RIGHT  05/09/2020   IR PERC PLEURAL DRAIN W/INDWELL CATH W/IMG GUIDE  01/01/2024   IR  US  GUIDE VASC ACCESS RIGHT  05/09/2020   RIGHT HEART CATH N/A 06/30/2023   Procedure: RIGHT HEART CATH;  Surgeon: Rolan Ezra RAMAN, MD;  Location: [facility] INVASIVE CV LAB;  Service: Cardiovascular;  Laterality: N/A;    Short Social History:  Social History   Tobacco Use   Smoking status: Every Day    Current packs/day: 0.20    Average packs/day: 0.2 packs/day for 14.0 years (2.8 ttl pk-yrs)    Types: Cigarettes    Passive exposure: Current   Smokeless tobacco: Never   Tobacco comments:    Pt is no longer smoking cigarettes, but smokes marijuana once a day  Substance Use Topics   Alcohol use: No    Allergies[1]  No current facility-administered medications for this encounter.    REVIEW OF SYSTEMS All other systems were reviewed and are negative     Objective:   Objective   Vitals:   08/24/24 1113 08/24/24 1117  BP: 134/83 110/84  Pulse: 84 86  Resp: 12 16  SpO2: 97% 95%   There is no height or weight on file to calculate BMI.  Physical Exam General: no acute distress Cardiac: hemodynamically stable Extremities: pulsatile left arm AVF  Data: Fistulogram from Dr. 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     Assessment/Plan:   [name] is a 34  y.o. female with ESRD and a poorly functioning left arm AVF. Having issues with low flows and prolonged bleeding after decannulation  I explained that given Dr. Claretta fistulogram from before as well as fact that she has been using his fistula for the past 14 years it is likely that she just needs a new access.  At that we will plan for fistulogram to ensure that the flow still looks similar to last month and if so then she will be scheduled for an office visit with vein mapping team to discuss new access.  We discussed first-line option of fistulogram to better evaluate flow as well as potentially treat flow-limiting stenoses  that could put this access at risk of future thrombosis. Risks and benefits were reviewed and the patient elected to proceed.   Norman Serve, MD [facility]      [1]  Allergies Allergen Reactions   Ciprofloxacin  Shortness Of Breath, Nausea And Vomiting and Other (See Comments)    HIGH FEVER and oral blisters    Other Anaphylaxis    Revaclear dialzer   Peanut -Containing Drug Products Anaphylaxis   Aleve [Naproxen Sodium] Other (See Comments)    G.I.Bleed   Ceftriaxone  Other (See Comments)    Blisters in mouth    Coconut (Cocos Nucifera) Hives   Influenza Vaccines Nausea And Vomiting and Other (See Comments)    High fever   Tetanus Toxoid, Adsorbed Nausea And Vomiting and Other (See Comments)    HIGH FEVER, also   Tetanus Toxoid-Containing Vaccines Nausea And Vomiting and Other (See Comments)    HIGH FEVER   Latex Itching and Rash   "

## 2024-08-24 NOTE — Op Note (Signed)
" ° ° °  Patient name: [name] MRN: [id number] DOB: Jun 18, 1991 Sex: female  08/24/2024 Pre-operative Diagnosis: ESRD on HD Post-operative diagnosis:  Same Surgeon:  Norman GORMAN Serve, MD Procedure Performed:  Outpatient evaluation, level 3 Ultrasound-guided access of dialysis circuit, fistulogram and central venogram.  [phone]  Indications: [name] is a 34 year old female with ESRD on HD presenting to the HD access center for consideration of fistulogram.  She has a poorly functioning left arm AV fistula she has been having issues with low flows and prolonged bleeding after decannulation.  She underwent fistulogram with Dr. 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  She has had this fistula for approximately 14 years.  I explained we will plan for fistulogram to ensure that her flow still looks the same as last month but ultimately she will likely need to be scheduled for an office visit to discuss new access.  Risks and benefits of hysterogram were reviewed and she elected to proceed.  Findings:  Widely patent central venous system.  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  The cephalic arch is occluded and the outflow through to the fistula is through the left IJ.  No intervention performed.   Procedure:  The patient was identified in the holding area and taken to the cath lab  The patient was then placed supine on the table and prepped and draped in the usual sterile fashion.  A time out was called.  Ultrasound was used to evaluate the left arm AV access. This was accessed under u/s guidance. An 018 wire was advanced without resistance, a micropuncture sheath was placed and fistulagram obtained which demonstrated the above findings.   Contrast: 16 cc Sedation: None  Impression: Patent but severely calcified AV fistula.  Given  that this been used for 14 years I explained that she just needs a new access. An office visit will be set up to discuss new access creation.   Norman GORMAN Serve MD [facility] [place] Office: [phone]  "

## 2024-08-25 ENCOUNTER — Encounter (HOSPITAL_COMMUNITY): Payer: Self-pay | Admitting: Vascular Surgery

## 2024-08-26 ENCOUNTER — Telehealth: Payer: Self-pay

## 2024-09-06 ENCOUNTER — Ambulatory Visit: Admitting: Thoracic Surgery (Cardiothoracic Vascular Surgery)
